# Patient Record
Sex: Male | Born: 1935
Health system: Southern US, Community
[De-identification: ages and names within clinical notes are randomized; demographics above are authoritative.]

## PROBLEM LIST (undated history)

## (undated) DIAGNOSIS — Z950 Presence of cardiac pacemaker: Secondary | ICD-10-CM

## (undated) DIAGNOSIS — R001 Bradycardia, unspecified: Secondary | ICD-10-CM

## (undated) DIAGNOSIS — T4145XA Adverse effect of unspecified anesthetic, initial encounter: Secondary | ICD-10-CM

## (undated) DIAGNOSIS — I10 Essential (primary) hypertension: Secondary | ICD-10-CM

## (undated) DIAGNOSIS — N183 Chronic kidney disease, stage 3 (moderate): Secondary | ICD-10-CM

## (undated) DIAGNOSIS — E559 Vitamin D deficiency, unspecified: Secondary | ICD-10-CM

## (undated) DIAGNOSIS — T8859XA Other complications of anesthesia, initial encounter: Secondary | ICD-10-CM

## (undated) DIAGNOSIS — M1990 Unspecified osteoarthritis, unspecified site: Secondary | ICD-10-CM

## (undated) DIAGNOSIS — H9190 Unspecified hearing loss, unspecified ear: Secondary | ICD-10-CM

## (undated) DIAGNOSIS — E785 Hyperlipidemia, unspecified: Secondary | ICD-10-CM

## (undated) DIAGNOSIS — K219 Gastro-esophageal reflux disease without esophagitis: Secondary | ICD-10-CM

## (undated) DIAGNOSIS — I4819 Other persistent atrial fibrillation: Secondary | ICD-10-CM

## (undated) DIAGNOSIS — K635 Polyp of colon: Secondary | ICD-10-CM

## (undated) HISTORY — DX: Gastro-esophageal reflux disease without esophagitis: K21.9

## (undated) HISTORY — PX: CATARACT EXTRACTION: SUR2

## (undated) HISTORY — DX: Presence of cardiac pacemaker: Z95.0

## (undated) HISTORY — DX: Vitamin D deficiency, unspecified: E55.9

## (undated) HISTORY — PX: ANKLE ARTHROPLASTY: SUR68

## (undated) HISTORY — DX: Essential (primary) hypertension: I10

## (undated) HISTORY — PX: CHOLECYSTECTOMY: SHX55

## (undated) HISTORY — DX: Hyperlipidemia, unspecified: E78.5

## (undated) HISTORY — PX: TONSILLECTOMY: SUR1361

## (undated) HISTORY — DX: Polyp of colon: K63.5

## (undated) HISTORY — PX: ROTATOR CUFF REPAIR: SHX139

## (undated) HISTORY — PX: SHOULDER SURGERY: SHX246

---

## 1999-05-10 ENCOUNTER — Encounter: Payer: Self-pay | Admitting: Family Medicine

## 1999-05-10 ENCOUNTER — Encounter: Admission: RE | Admit: 1999-05-10 | Discharge: 1999-05-10 | Payer: Self-pay | Admitting: Family Medicine

## 1999-06-14 ENCOUNTER — Encounter: Admission: RE | Admit: 1999-06-14 | Discharge: 1999-07-06 | Payer: Self-pay | Admitting: Neurosurgery

## 2002-06-02 ENCOUNTER — Ambulatory Visit (HOSPITAL_COMMUNITY): Admission: RE | Admit: 2002-06-02 | Discharge: 2002-06-02 | Payer: Self-pay | Admitting: *Deleted

## 2002-06-02 ENCOUNTER — Encounter (INDEPENDENT_AMBULATORY_CARE_PROVIDER_SITE_OTHER): Payer: Self-pay | Admitting: Specialist

## 2002-08-20 ENCOUNTER — Encounter: Admission: RE | Admit: 2002-08-20 | Discharge: 2002-08-20 | Payer: Self-pay | Admitting: Family Medicine

## 2002-08-20 ENCOUNTER — Encounter: Payer: Self-pay | Admitting: Family Medicine

## 2002-09-13 ENCOUNTER — Encounter: Payer: Self-pay | Admitting: Family Medicine

## 2002-09-13 ENCOUNTER — Encounter: Admission: RE | Admit: 2002-09-13 | Discharge: 2002-09-13 | Payer: Self-pay | Admitting: Family Medicine

## 2003-03-17 ENCOUNTER — Encounter: Admission: RE | Admit: 2003-03-17 | Discharge: 2003-03-17 | Payer: Self-pay | Admitting: Family Medicine

## 2004-09-05 ENCOUNTER — Encounter: Admission: RE | Admit: 2004-09-05 | Discharge: 2004-09-05 | Payer: Self-pay | Admitting: General Surgery

## 2004-09-06 ENCOUNTER — Ambulatory Visit (HOSPITAL_COMMUNITY): Admission: RE | Admit: 2004-09-06 | Discharge: 2004-09-06 | Payer: Self-pay | Admitting: General Surgery

## 2005-05-06 ENCOUNTER — Encounter: Admission: RE | Admit: 2005-05-06 | Discharge: 2005-05-06 | Payer: Self-pay | Admitting: Orthopaedic Surgery

## 2005-05-21 ENCOUNTER — Encounter: Admission: RE | Admit: 2005-05-21 | Discharge: 2005-06-12 | Payer: Self-pay | Admitting: Orthopaedic Surgery

## 2005-06-13 ENCOUNTER — Ambulatory Visit (HOSPITAL_BASED_OUTPATIENT_CLINIC_OR_DEPARTMENT_OTHER): Admission: RE | Admit: 2005-06-13 | Discharge: 2005-06-14 | Payer: Self-pay | Admitting: Orthopaedic Surgery

## 2005-06-25 ENCOUNTER — Encounter: Admission: RE | Admit: 2005-06-25 | Discharge: 2005-08-18 | Payer: Self-pay | Admitting: Orthopaedic Surgery

## 2005-08-19 ENCOUNTER — Encounter: Admission: RE | Admit: 2005-08-19 | Discharge: 2005-11-17 | Payer: Self-pay | Admitting: Orthopaedic Surgery

## 2007-12-04 ENCOUNTER — Encounter (INDEPENDENT_AMBULATORY_CARE_PROVIDER_SITE_OTHER): Payer: Self-pay | Admitting: *Deleted

## 2007-12-04 ENCOUNTER — Ambulatory Visit (HOSPITAL_COMMUNITY): Admission: RE | Admit: 2007-12-04 | Discharge: 2007-12-04 | Payer: Self-pay | Admitting: *Deleted

## 2009-01-03 ENCOUNTER — Encounter: Admission: RE | Admit: 2009-01-03 | Discharge: 2009-01-03 | Payer: Self-pay | Admitting: Family Medicine

## 2010-07-31 NOTE — Op Note (Signed)
NAME:  Vincent Allen, Vincent Allen             ACCOUNT NO.:  [id number]   MEDICAL RECORD NO.:  [id number]          PATIENT TYPE:  AMB   LOCATION:  ENDO                         FACILITY:  [facility]   PHYSICIAN:  Waverly Ferrari, M.D.    DATE OF BIRTH:  [birth year]   DATE OF PROCEDURE:  12/04/2007  DATE OF DISCHARGE:                               OPERATIVE REPORT   PROCEDURE:  Upper endoscopy.   INDICATIONS:  GERD with dysphagia.   ANESTHESIA:  Fentanyl 75 mcg, Versed 7.5 mg.   PROCEDURE:  With the patient mildly sedated in the left lateral  decubitus position, the Pentax videoscopic endoscope was inserted and  passed under direct vision through the esophagus which appeared normal  until we reached distal esophagus.  There is an area of inflammatory  change distally just above the squamocolumnar junction.  This was  photographed and biopsied.  We entered into the stomach.  Fundus, body  appeared normal.  Antrum, however, showed erythema in a linear fashion  extending on a fold emanating from the pylorus.  This too was  photographed and biopsied.  Duodenal bulb, second portion of duodenum  were entered and photographed, appeared normal.  From this point the  endoscope was slowly withdrawn taking circumferential views of duodenal  mucosa until the endoscope been pulled back into stomach, placed in  retroflexion to view the stomach from below.  The endoscope was  straightened and withdrawn taking circumferential views of remaining  gastric and esophageal mucosa.  The patient's vital signs, pulse  oximeter remained stable.  The patient tolerated procedure well without  apparent complications.   FINDINGS:  Inflammatory changes of distal esophagus biopsied.  Await  biopsy report.  The patient will call me for results and follow-up with  me as an outpatient.  Will start the patient on PPI and see if that  helps.  It is sporadic dysphagia symptoms.           ______________________________  Waverly Ferrari, M.D.     GMO/MEDQ  D:  12/04/2007  T:  12/07/2007  Job:  [id number]   cc:   Osvaldo Human, M.D.  Fax: [phone]

## 2010-07-31 NOTE — Op Note (Signed)
NAME:  ROCZEN, KOEHNEN             ACCOUNT NO.:  [id number]   MEDICAL RECORD NO.:  [id number]          PATIENT TYPE:  AMB   LOCATION:  ENDO                         FACILITY:  [facility]   PHYSICIAN:  Waverly Ferrari, M.D.    DATE OF BIRTH:  January 01, 1934   DATE OF PROCEDURE:  12/04/2007  DATE OF DISCHARGE:                               OPERATIVE REPORT   PROCEDURE:  Colonoscopy.   INDICATIONS:  Colon cancer screening.  Family history of colon cancer.   ANESTHESIA:  Fentanyl 50 mcg, Versed 5 mg.   PROCEDURE:  With the patient mildly sedated in the left lateral  decubitus position, a rectal examination was attempted.  Subsequently  the Pentax videoscopic colonoscope was inserted into the rectum and  passed under direct vision with pressure applied to reach the cecum  identified by ileocecal valve and appendiceal orifice, both of which  were photographed.  From this point the colonoscope was slowly withdrawn  taking circumferential views of colonic mucosa stopping only to suction  along the way especially almost the entire left colon tenacious yellow  material that had to be washed and suctioned as best we could until we  reached the rectum which appeared normal on direct and showed  hemorrhoids on retroflexed view.  The endoscope was straightened,  withdrawn.  The patient's vital signs, pulse oximeter remained stable.  The patient tolerated procedure well without apparent complication.   FINDINGS:  Internal hemorrhoids otherwise unremarkable examination  limited somewhat especially in the left side from prep but no gross  lesions seen, although small lesions could have been missed under some  of the tenacious material.   PLAN:  Will have patient follow-up with me for this with repeat  examination in 5 years.           ______________________________  Waverly Ferrari, M.D.     GMO/MEDQ  D:  12/04/2007  T:  12/07/2007  Job:  [id number]   cc:   Osvaldo Human, M.D.  Fax: [phone]

## 2010-08-03 NOTE — Op Note (Signed)
   NAME:  Vincent Allen, Vincent Allen                       ACCOUNT NO.:  [id number]   MEDICAL RECORD NO.:  [id number]                   PATIENT TYPE:  AMB   LOCATION:  ENDO                                 FACILITY:  [facility]   PHYSICIAN:  Waverly Ferrari, M.D.                 DATE OF BIRTH:  11-10-1933   DATE OF PROCEDURE:  06/02/2002  DATE OF DISCHARGE:                                 OPERATIVE REPORT   PROCEDURE:  Colonoscopy.   INDICATIONS:  Colon polyp.   ANESTHESIA:  Demerol 80 mg, Versed 7 mg.   DESCRIPTION OF PROCEDURE:  With the patient mildly sedated in the left  lateral decubitus position, the Olympus videoscopic colonoscope was inserted  in the rectum and passed under direct vision to the cecum, identified by the  ileocecal valve and appendiceal orifice, both of which were photographed.  In the cecum a small polyp was seen and photographed and removed using hot  biopsy forceps technique, setting of 20-20 blended current.  The endoscope  was then withdrawn, taking circumferential views of the remaining colonic  mucosa, stopping to photograph diverticula seen along the way both in the  right and left colon until we reached the rectum.  At approximately 15 cm  from the anal verge a second polyp was seen, photographed, and removed,  again using hot biopsy forceps technique and again a setting of 20/20  blended current.  The endoscope was then withdrawn.  I could not place it in  retroflexed view, noting that it made the patient too uncomfortable to do,  but no gross lesions were seen other than this, and the endoscope was  withdrawn.  The patient's vital signs and pulse oximetry remained stable.  The patient tolerated the procedure well without apparent complications.   FINDINGS:  Polyp of the cecum and at 15 cm from the anal verge.   PLAN:  Await biopsy report.  Both polyps were small and benign-appearing.  The patient will call me for results and follow up with me as an  outpatient.                                               Waverly Ferrari, M.D.    GMO/MEDQ  D:  06/02/2002  T:  06/02/2002  Job:  [id number]   cc:   Osvaldo Human, M.D.  [address] [place]  [place] [address]  Fax: [phone]

## 2010-08-03 NOTE — Op Note (Signed)
NAME:  Vincent Allen, Vincent Allen NO.:  [id number]   MEDICAL RECORD NO.:  [id number]           PATIENT TYPE:   LOCATION:                                 FACILITY:   PHYSICIAN:  Vonna Kotyk. Durward Fortes, M.D.    DATE OF BIRTH:   DATE OF PROCEDURE:  06/13/2005  DATE OF DISCHARGE:                                 OPERATIVE REPORT   PREOPERATIVE DIAGNOSES:  1.  Massive rotator cuff tear with impingement right shoulder.  2.  Degenerative joint disease of acromioclavicular joint.   POSTOPERATIVE DIAGNOSES:  1.  Massive rotator cuff tear with impingement right shoulder.  2.  Degenerative joint disease of acromioclavicular joint.  3.  Rupture of biceps tendon.   PROCEDURE:  1.  Arthroscopic debridement of right shoulder.  2.  Arthroscopic subacromial decompression.  3.  Arthroscopic distal clavicle resection.  4.  Mini open rotator cuff tear repair (partial repair).   SURGEON:  Vonna Kotyk. Durward Fortes, M.D.   ASSISTANT:  Erskine Emery, P.A.-C.   ANESTHESIA:  General orotracheal with supplemental neuroscalene nerve block.   COMPLICATIONS:  None.   HISTORY:  This 77 year old gentleman has been having trouble with his right  shoulder for approximately 2 months. He fell on his right side with  immediate onset of shoulder pain.  Prior to that time he really was not  having any significant discomfort, although he does note that in 1999 he  felt and hurt his shoulder but that pain resolved.  He had some weakness  with internal-external rotation with pain over the greater tuberosity.  X-  rays consistent with a high-riding humeral head.  An MRI scan revealed a  grade 2 AC joint separation with probable fracture fragments along the  dorsal aspect of the joint and an extensive full-thickness cuff tear of the  supra-infraspinatus tendons with retraction of the musculotendinous junction  of approximately 4 cm.  He is now to have an arthroscopic evaluation attempt  at rotator cuff tear  repair.   DESCRIPTION OF PROCEDURE:  With the patient comfortable on the operating  room table and under general endotracheal anesthesia the patient was placed  in the semisitting position with the shoulder frame.  The right shoulder was  then prepped with dura prep and the base of the neck circumferentially to  about the wrist.  Sterile draping was then performed.  The patient did have  a preoperative interscalene nerve block.  Marking pen was used to outline  the AC-joint, the coracoid and acromion.  At a point a fingerbreadth  posterior and medial to the posterior angle of the acromion a small stab  wound was made and the arthroscope was easily placed into the shoulder  joint.  Arthroscopic evaluation revealed acute synovitis and, accordingly a  second portal was established anteriorly with the 4 mm cannula. The biceps  tendon appeared to be attached by one small thread and this was debrided  with the ArthroCare wand.  Synovitis was debrided with the ArthroCare wand,  as well.  There was some fraying of the glenoid labrum which was debrided.  I did not see  any appreciable chondromalacia of the humeral head or the  glenoid nor did I see any loose bodies.  There was an obvious massive  rotator cuff tear as I could easily visualize the subacromial space from the  joint.   The arthroscope was then placed in the subacromial space posteriorly, the  cannula placed in the subacromial space anteriorly, and a third portal  established in the lateral subacromial space. An arthroscopic subacromial  decompression was performed with the ArthroCare wand and the Coude shaver.  There was considerable bursal material which was debrided.  There was  obvious anterior overhang of the acromion.  A 6 mm burr was used to obtain  an anterior-inferior acromioplasty.  There was considerable degenerative  change in the distal clavicle; and, accordingly, a distal clavicle resection  was performed using the same  6-mm bur with an excellent resection.   An open exploration of the rotator cuff was then performed through about 1-  1/2-inch incision along the anterior aspect of the shoulder.  A very sharp  dissection and incision carried down to the subcutaneous tissue.  Deltoid  fascia was identified and incised along its fibers.  A self-retaining  retractor was inserted.  There was still bursal tissue in the recesses  anteriorly and laterally and this was debrided.  The edge of the rotator  cuff was identified anteriorly as well as posteriorly.  This involved the  infra and supraspinatus as well as the subscapularis.  The biceps tendon was  not visualized and the previously identified had been ruptured.  The tear  was massive and was significantly retracted.  I was able to advance it to  some extent, but still left a relatively large gap between the anterior and  posterior leaflets.  Three __________ anchors were utilized to obtain a  partial repair.  The wound was then irrigated with saline solution.  The  deltoid fascia was closed with a running #0 Vicryl.  The subcu with 2-0  Vicryl and the skin closed with Prolene.  A sterile bulky dressing was  applied followed by a sling.   PLAN:  Recovery care.  Discharge in the a.m. on Percocet for pain.      Vonna Kotyk. Durward Fortes, M.D.  Electronically Signed     PWW/MEDQ  D:  06/13/2005  T:  06/14/2005  Job:  [id number]

## 2010-08-28 ENCOUNTER — Encounter: Payer: Self-pay | Admitting: Internal Medicine

## 2010-08-28 ENCOUNTER — Ambulatory Visit (INDEPENDENT_AMBULATORY_CARE_PROVIDER_SITE_OTHER): Payer: Medicare Other | Admitting: Internal Medicine

## 2010-08-28 DIAGNOSIS — I10 Essential (primary) hypertension: Secondary | ICD-10-CM

## 2010-08-28 DIAGNOSIS — I6529 Occlusion and stenosis of unspecified carotid artery: Secondary | ICD-10-CM

## 2010-08-28 DIAGNOSIS — Z800 Family history of malignant neoplasm of digestive organs: Secondary | ICD-10-CM

## 2010-08-28 DIAGNOSIS — M8580 Other specified disorders of bone density and structure, unspecified site: Secondary | ICD-10-CM

## 2010-08-28 DIAGNOSIS — G5600 Carpal tunnel syndrome, unspecified upper limb: Secondary | ICD-10-CM

## 2010-08-28 DIAGNOSIS — M949 Disorder of cartilage, unspecified: Secondary | ICD-10-CM

## 2010-08-28 DIAGNOSIS — E785 Hyperlipidemia, unspecified: Secondary | ICD-10-CM

## 2010-08-28 DIAGNOSIS — K219 Gastro-esophageal reflux disease without esophagitis: Secondary | ICD-10-CM

## 2010-08-28 DIAGNOSIS — Z125 Encounter for screening for malignant neoplasm of prostate: Secondary | ICD-10-CM

## 2010-08-28 DIAGNOSIS — E119 Type 2 diabetes mellitus without complications: Secondary | ICD-10-CM

## 2010-08-28 DIAGNOSIS — M899 Disorder of bone, unspecified: Secondary | ICD-10-CM

## 2010-08-28 LAB — LIPID PANEL
Cholesterol: 145 mg/dL (ref 0–200)
HDL: 30.5 mg/dL — ABNORMAL LOW
LDL Cholesterol: 83 mg/dL (ref 0–99)
Total CHOL/HDL Ratio: 5
Triglycerides: 160 mg/dL — ABNORMAL HIGH (ref 0.0–149.0)
VLDL: 32 mg/dL (ref 0.0–40.0)

## 2010-08-28 LAB — MICROALBUMIN / CREATININE URINE RATIO: Microalb, Ur: 0.3 mg/dL (ref 0.0–1.9)

## 2010-08-28 LAB — BASIC METABOLIC PANEL WITH GFR
BUN: 36 mg/dL — ABNORMAL HIGH (ref 6–23)
CO2: 28 meq/L (ref 19–32)
Calcium: 9 mg/dL (ref 8.4–10.5)
Chloride: 104 meq/L (ref 96–112)
Creatinine, Ser: 1.5 mg/dL (ref 0.4–1.5)
GFR: 48.48 mL/min — ABNORMAL LOW
Glucose, Bld: 115 mg/dL — ABNORMAL HIGH (ref 70–99)
Potassium: 5.3 meq/L — ABNORMAL HIGH (ref 3.5–5.1)
Sodium: 138 meq/L (ref 135–145)

## 2010-08-28 LAB — HEMOGLOBIN A1C: Hgb A1c MFr Bld: 6.9 % — ABNORMAL HIGH (ref 4.6–6.5)

## 2010-08-28 LAB — CBC WITH DIFFERENTIAL/PLATELET
Basophils Absolute: 0 10*3/uL (ref 0.0–0.1)
Eosinophils Absolute: 0.1 10*3/uL (ref 0.0–0.7)
HCT: 40.3 % (ref 39.0–52.0)
Lymphs Abs: 1.5 10*3/uL (ref 0.7–4.0)
RBC: 4.74 Mil/uL (ref 4.22–5.81)
WBC: 5.8 10*3/uL (ref 4.5–10.5)

## 2010-08-28 LAB — HEPATIC FUNCTION PANEL
ALT: 18 U/L (ref 0–53)
AST: 19 U/L (ref 0–37)
Albumin: 3.5 g/dL (ref 3.5–5.2)
Alkaline Phosphatase: 58 U/L (ref 39–117)
Bilirubin, Direct: 0.1 mg/dL (ref 0.0–0.3)

## 2010-08-28 LAB — PSA: PSA: 1.07 ng/mL (ref 0.10–4.00)

## 2010-08-29 ENCOUNTER — Telehealth: Payer: Self-pay

## 2010-08-29 NOTE — Telephone Encounter (Signed)
Message copied by Santiago Bumpers on Wed Aug 29, 2010  2:14 PM ------      Message from: Anise Salvo      Created: Tue Aug 28, 2010 10:04 PM       A1c under average control 6.9. hdl low and may improve with regular exercise. Other chol numbers ok. psa and urine protein nl

## 2010-08-29 NOTE — Telephone Encounter (Signed)
Pt.notified

## 2010-09-02 DIAGNOSIS — I779 Disorder of arteries and arterioles, unspecified: Secondary | ICD-10-CM | POA: Insufficient documentation

## 2010-09-02 DIAGNOSIS — E785 Hyperlipidemia, unspecified: Secondary | ICD-10-CM | POA: Insufficient documentation

## 2010-09-02 DIAGNOSIS — E1122 Type 2 diabetes mellitus with diabetic chronic kidney disease: Secondary | ICD-10-CM | POA: Insufficient documentation

## 2010-09-02 DIAGNOSIS — Z800 Family history of malignant neoplasm of digestive organs: Secondary | ICD-10-CM | POA: Insufficient documentation

## 2010-09-02 DIAGNOSIS — I119 Hypertensive heart disease without heart failure: Secondary | ICD-10-CM | POA: Insufficient documentation

## 2010-09-02 DIAGNOSIS — M8580 Other specified disorders of bone density and structure, unspecified site: Secondary | ICD-10-CM | POA: Insufficient documentation

## 2010-09-02 DIAGNOSIS — K219 Gastro-esophageal reflux disease without esophagitis: Secondary | ICD-10-CM | POA: Insufficient documentation

## 2010-09-02 NOTE — Assessment & Plan Note (Signed)
Attempt wrist splint bilaterally.Followup if no improvement or worsening.

## 2010-09-02 NOTE — Assessment & Plan Note (Signed)
Schedule bone density.    

## 2010-09-02 NOTE — Progress Notes (Signed)
  Subjective:    Patient ID: Vincent Allen, male    DOB: 1934-01-02, 77 y.o.   MRN: [id number]  HPI patient presents to clinic to establish care and for evaluation of multiple medical problems. Has history of diabetes with typical values between 85 and 95 with a low of 65. No hypoglycemic symptoms.  Does have family history of colon cancer in first degree relatives and believes colonoscopy performed approximately 2008. Has no abdominal pain, change in bowel habits or blood in stool. May 2012 performed [employer] screening and this is reviewed with patient today. Mild plaque in left carotid, normal ABI, normal EKG and bone density with -1.7 T score. No history of bony fracture. Does have bilateral fingertip numbness intermittently of left greater than right. No neck pain, radicular pain shoulder pain or elbow pain. States cannot obtain flu shot because of allergy. Blood pressure reviewed under average control with history of hypertension. Tolerates statin therapy without myalgias or known history of abnormal liver function tests. No other complaints  Reviewed past medical history, past surgical history, medications, allergies, social history and family history  Review of Systems review of systems negative except as modified per history of present illness     Objective:   Physical Exam    Physical Exam  Vitals reviewed. Constitutional:  appears well-developed and well-nourished. No distress.  HENT:  Head: Normocephalic and atraumatic.  Right Ear: Tympanic membrane, external ear and ear canal normal.  Left Ear: Tympanic membrane, external ear and ear canal normal.  Nose: Nose normal.  Mouth/Throat: Oropharynx is clear and moist. No oropharyngeal exudate.  Eyes: Conjunctivae and EOM are normal. Pupils are equal, round, and reactive to light. Right eye exhibits no discharge. Left eye exhibits no discharge. No scleral icterus.  Neck: Neck supple. No thyromegaly present.  Cardiovascular: Normal  rate, regular rhythm and normal heart sounds.  Exam reveals no gallop and no friction rub.   No murmur heard. Pulmonary/Chest: Effort normal and breath sounds normal. No respiratory distress.  has no wheezes.  has no rales.  Lymphadenopathy:   no cervical adenopathy.  Neurological:  is alert.  Skin: Skin is warm and dry.  not diaphoretic.  Psychiatric: normal mood and affect.      Assessment & Plan:

## 2010-09-02 NOTE — Assessment & Plan Note (Signed)
Continue statin therapy. Obtain fasting lipid profile and liver function tests

## 2010-09-02 NOTE — Assessment & Plan Note (Signed)
No stenosis. Recommend aspirin, control of cholesterol and blood pressure.

## 2010-09-02 NOTE — Assessment & Plan Note (Signed)
Good control based on fingerstick blood sugar. Hold Actos and continue to monitor blood sugar. Obtain CBC, Chem-7, A1c and urine microalbumin

## 2010-09-28 ENCOUNTER — Other Ambulatory Visit: Payer: Self-pay | Admitting: *Deleted

## 2010-09-28 MED ORDER — VERAPAMIL HCL 240 MG PO TBCR: 240.0000 mg | EXTENDED_RELEASE_TABLET | Freq: Every day | ORAL | Status: DC

## 2010-09-28 MED ORDER — LISINOPRIL 5 MG PO TABS: 5.0000 mg | ORAL_TABLET | Freq: Every day | ORAL | Status: DC

## 2010-09-28 MED ORDER — ATORVASTATIN CALCIUM 20 MG PO TABS: 20.0000 mg | ORAL_TABLET | Freq: Every day | ORAL | Status: DC

## 2010-09-28 NOTE — Telephone Encounter (Signed)
Refill Lisinopril 10 mg, verapamil 240 mg, lipitor 40 mg [facility]

## 2010-11-09 ENCOUNTER — Telehealth: Payer: Self-pay | Admitting: Internal Medicine

## 2010-11-09 MED ORDER — TRIAMTERENE-HCTZ 75-50 MG PO TABS: 1.0000 | ORAL_TABLET | Freq: Every day | ORAL | Status: DC

## 2010-11-09 NOTE — Telephone Encounter (Signed)
Refill- triamterene-hctz 75-50mg  tab. Take one tablet once a day orally. Qty 34. Last fill 7.29.12

## 2010-11-09 NOTE — Telephone Encounter (Signed)
Rx refill sent to pharmacy. 

## 2010-11-20 ENCOUNTER — Telehealth: Payer: Self-pay | Admitting: Internal Medicine

## 2010-11-20 MED ORDER — GLIPIZIDE 10 MG PO TABS: 10.0000 mg | ORAL_TABLET | Freq: Two times a day (BID) | ORAL | Status: DC

## 2010-11-20 NOTE — Telephone Encounter (Signed)
Refill-glipizide er 10mg  tablet. Take two tablets by mouth every day. Qty 30 last fill 8.7.12

## 2010-11-20 NOTE — Telephone Encounter (Signed)
Rx refill sent to pharmacy. 

## 2010-11-28 ENCOUNTER — Ambulatory Visit: Payer: Medicare Other | Admitting: Internal Medicine

## 2010-12-10 ENCOUNTER — Ambulatory Visit: Payer: Medicare Other | Attending: Orthopaedic Surgery

## 2010-12-10 DIAGNOSIS — M6281 Muscle weakness (generalized): Secondary | ICD-10-CM | POA: Insufficient documentation

## 2010-12-10 DIAGNOSIS — M25519 Pain in unspecified shoulder: Secondary | ICD-10-CM | POA: Insufficient documentation

## 2010-12-10 DIAGNOSIS — R5381 Other malaise: Secondary | ICD-10-CM | POA: Insufficient documentation

## 2010-12-10 DIAGNOSIS — M25619 Stiffness of unspecified shoulder, not elsewhere classified: Secondary | ICD-10-CM | POA: Insufficient documentation

## 2010-12-10 DIAGNOSIS — IMO0001 Reserved for inherently not codable concepts without codable children: Secondary | ICD-10-CM | POA: Insufficient documentation

## 2010-12-12 ENCOUNTER — Ambulatory Visit: Payer: Medicare Other

## 2010-12-17 ENCOUNTER — Ambulatory Visit: Payer: Medicare Other | Attending: Orthopaedic Surgery

## 2010-12-17 DIAGNOSIS — M25619 Stiffness of unspecified shoulder, not elsewhere classified: Secondary | ICD-10-CM | POA: Insufficient documentation

## 2010-12-17 DIAGNOSIS — M6281 Muscle weakness (generalized): Secondary | ICD-10-CM | POA: Insufficient documentation

## 2010-12-17 DIAGNOSIS — R5381 Other malaise: Secondary | ICD-10-CM | POA: Insufficient documentation

## 2010-12-17 DIAGNOSIS — M25519 Pain in unspecified shoulder: Secondary | ICD-10-CM | POA: Insufficient documentation

## 2010-12-17 DIAGNOSIS — IMO0001 Reserved for inherently not codable concepts without codable children: Secondary | ICD-10-CM | POA: Insufficient documentation

## 2010-12-21 ENCOUNTER — Other Ambulatory Visit: Payer: Self-pay | Admitting: Internal Medicine

## 2010-12-21 ENCOUNTER — Encounter: Payer: Medicare Other | Admitting: Physical Therapy

## 2010-12-21 NOTE — Telephone Encounter (Signed)
Rx refill sent to pharmacy. 

## 2010-12-24 ENCOUNTER — Encounter: Payer: Medicare Other | Admitting: Physical Therapy

## 2010-12-26 ENCOUNTER — Ambulatory Visit (INDEPENDENT_AMBULATORY_CARE_PROVIDER_SITE_OTHER): Payer: Medicare Other | Admitting: Internal Medicine

## 2010-12-26 ENCOUNTER — Encounter: Payer: Medicare Other | Admitting: Physical Therapy

## 2010-12-26 ENCOUNTER — Encounter: Payer: Self-pay | Admitting: Internal Medicine

## 2010-12-26 DIAGNOSIS — E785 Hyperlipidemia, unspecified: Secondary | ICD-10-CM

## 2010-12-26 DIAGNOSIS — M8580 Other specified disorders of bone density and structure, unspecified site: Secondary | ICD-10-CM

## 2010-12-26 DIAGNOSIS — E119 Type 2 diabetes mellitus without complications: Secondary | ICD-10-CM

## 2010-12-26 DIAGNOSIS — I10 Essential (primary) hypertension: Secondary | ICD-10-CM

## 2010-12-26 DIAGNOSIS — M899 Disorder of bone, unspecified: Secondary | ICD-10-CM

## 2010-12-26 LAB — HEPATIC FUNCTION PANEL
Albumin: 3.8 g/dL (ref 3.5–5.2)
Bilirubin, Direct: 0.2 mg/dL (ref 0.0–0.3)
Indirect Bilirubin: 0.6 mg/dL (ref 0.0–0.9)
Total Bilirubin: 0.8 mg/dL (ref 0.3–1.2)
Total Protein: 7.1 g/dL (ref 6.0–8.3)

## 2010-12-26 LAB — LIPID PANEL
Cholesterol: 156 mg/dL (ref 0–200)
HDL: 25 mg/dL — ABNORMAL LOW (ref >39)
Total CHOL/HDL Ratio: 6.2 Ratio
VLDL: 37 mg/dL (ref 0–40)

## 2010-12-26 LAB — BASIC METABOLIC PANEL: Glucose, Bld: 128 mg/dL — ABNORMAL HIGH (ref 70–99)

## 2010-12-26 LAB — HEMOGLOBIN A1C: Hgb A1c MFr Bld: 7.7 % — ABNORMAL HIGH (ref <5.7)

## 2010-12-26 NOTE — Assessment & Plan Note (Signed)
Schedule BMD

## 2010-12-26 NOTE — Patient Instructions (Signed)
Please schedule bone density (diagnosis-osteopenia)

## 2010-12-26 NOTE — Assessment & Plan Note (Signed)
Normotensive and stable. Continue current regimen. Monitor bp as outpt and followup in clinic as scheduled.  

## 2010-12-26 NOTE — Assessment & Plan Note (Signed)
Mildly suboptimal fsbs secondary to recent trauma. Obtain chem7 and a1c. Anticipate improvement of control once meals are nl and activity improved.

## 2010-12-26 NOTE — Progress Notes (Signed)
  Subjective:    Patient ID: Vincent Allen, male    DOB: February 08, 1934, 77 y.o.   MRN: [id number]  HPI Pt presents to clinic for followup of multiple medical problems. Since last visit had injury to left shoulder that required surgery. Did well postoperatively however suffered fall ~2wks ago with resulting fx involving left ?shoulder. Did not require surgery. Is followed by orthopedics and taking alternating percocet and ultram. Diabetes off actos had been under average control with fsbs low 100's without hypoglycemia. With recent fx has seen mild increase to mid 100's. BP reviewed nl. Has lost weight since being off actos. No other complaints.   Past Medical History  Diagnosis Date  . Cataract   . GERD (gastroesophageal reflux disease)   . Diabetes mellitus   . Hypertension   . Hyperlipidemia   . Colon polyps    Past Surgical History  Procedure Date  . Cataract extraction   . Rotator cuff repair   . Ankle arthroplasty   . Tonsillectomy     reports that he has never smoked. He has never used smokeless tobacco. He reports that he does not drink alcohol or use illicit drugs. family history includes Cancer in his mother and paternal grandmother; Heart disease in his father; and Stroke in his father. Allergies  Allergen Reactions  . Penicillins      Review of Systems see hpi     Objective:   Physical Exam  Physical Exam  Nursing note and vitals reviewed. Constitutional: Appears well-developed and well-nourished. No distress.  HENT: OP clear Head: Normocephalic and atraumatic.  Right Ear: External ear normal.  Left Ear: External ear normal.  Eyes: Conjunctivae are normal. No scleral icterus.  Neck: Neck supple. Carotid bruit is not present.  Cardiovascular: Normal rate, regular rhythm and normal heart sounds.  Exam reveals no gallop and no friction rub.   No murmur heard. Pulmonary/Chest: Effort normal and breath sounds normal. No respiratory distress. He has no wheezes. no  rales.  Lymphadenopathy:    He has no cervical adenopathy.  Neurological:Alert.  Skin: Skin is warm and dry. Not diaphoretic.  Psychiatric: Has a normal mood and affect.        Assessment & Plan:

## 2010-12-26 NOTE — Assessment & Plan Note (Signed)
Obtain lipid/lft. 

## 2010-12-28 ENCOUNTER — Other Ambulatory Visit: Payer: Self-pay | Admitting: Internal Medicine

## 2010-12-28 ENCOUNTER — Encounter: Payer: Medicare Other | Admitting: Physical Therapy

## 2010-12-31 ENCOUNTER — Encounter: Payer: Medicare Other | Admitting: Physical Therapy

## 2010-12-31 NOTE — Telephone Encounter (Signed)
Rx refill sent to pharmacy. 

## 2011-01-02 ENCOUNTER — Encounter: Payer: Medicare Other | Admitting: Physical Therapy

## 2011-01-03 ENCOUNTER — Ambulatory Visit: Payer: Medicare Other

## 2011-01-04 ENCOUNTER — Encounter: Payer: Medicare Other | Admitting: Physical Therapy

## 2011-01-07 ENCOUNTER — Ambulatory Visit: Payer: Medicare Other | Admitting: Physical Therapy

## 2011-01-10 ENCOUNTER — Ambulatory Visit: Payer: Medicare Other

## 2011-01-14 ENCOUNTER — Ambulatory Visit: Payer: Medicare Other

## 2011-01-14 ENCOUNTER — Ambulatory Visit (INDEPENDENT_AMBULATORY_CARE_PROVIDER_SITE_OTHER)
Admission: RE | Admit: 2011-01-14 | Discharge: 2011-01-14 | Disposition: A | Discharge status: Home / Self Care | Payer: Medicare Other | Source: Ambulatory Visit

## 2011-01-14 DIAGNOSIS — M8580 Other specified disorders of bone density and structure, unspecified site: Secondary | ICD-10-CM

## 2011-01-14 DIAGNOSIS — M899 Disorder of bone, unspecified: Secondary | ICD-10-CM

## 2011-01-14 DIAGNOSIS — M949 Disorder of cartilage, unspecified: Secondary | ICD-10-CM

## 2011-01-17 ENCOUNTER — Ambulatory Visit: Payer: Medicare Other | Attending: Orthopaedic Surgery

## 2011-01-17 ENCOUNTER — Encounter: Payer: Self-pay | Admitting: Internal Medicine

## 2011-01-17 DIAGNOSIS — M25619 Stiffness of unspecified shoulder, not elsewhere classified: Secondary | ICD-10-CM | POA: Insufficient documentation

## 2011-01-17 DIAGNOSIS — IMO0001 Reserved for inherently not codable concepts without codable children: Secondary | ICD-10-CM | POA: Insufficient documentation

## 2011-01-17 DIAGNOSIS — M25519 Pain in unspecified shoulder: Secondary | ICD-10-CM | POA: Insufficient documentation

## 2011-01-17 DIAGNOSIS — R5381 Other malaise: Secondary | ICD-10-CM | POA: Insufficient documentation

## 2011-01-17 DIAGNOSIS — M6281 Muscle weakness (generalized): Secondary | ICD-10-CM | POA: Insufficient documentation

## 2011-01-21 ENCOUNTER — Ambulatory Visit: Payer: Medicare Other

## 2011-01-24 ENCOUNTER — Ambulatory Visit: Payer: Medicare Other

## 2011-01-28 ENCOUNTER — Ambulatory Visit: Payer: Medicare Other

## 2011-01-30 ENCOUNTER — Other Ambulatory Visit: Payer: Self-pay | Admitting: Internal Medicine

## 2011-01-31 ENCOUNTER — Ambulatory Visit: Payer: Medicare Other

## 2011-02-04 ENCOUNTER — Ambulatory Visit: Payer: Medicare Other

## 2011-02-06 ENCOUNTER — Encounter: Payer: Medicare Other | Admitting: Physical Therapy

## 2011-02-11 ENCOUNTER — Telehealth: Payer: Self-pay | Admitting: Internal Medicine

## 2011-02-11 ENCOUNTER — Encounter: Payer: Medicare Other | Admitting: Physical Therapy

## 2011-02-11 NOTE — Telephone Encounter (Signed)
Glipizide er 10mg  tablet. Take one tablet by mouth twice a day before meal. Qty 60   Pharmacy comments: pt says 3 tabs per day, need new rx

## 2011-02-12 ENCOUNTER — Telehealth: Payer: Self-pay | Admitting: Internal Medicine

## 2011-02-12 MED ORDER — GLIPIZIDE 10 MG PO TABS: ORAL_TABLET | ORAL | Status: DC

## 2011-02-12 NOTE — Telephone Encounter (Signed)
[facility] [phone], spoke with pharmacist Maudie Mercury. She was advised of dosing change and indication with Er. Rx will remain at regular Glipizide per Dr Elizebeth Koller.

## 2011-02-12 NOTE — Telephone Encounter (Signed)
Rx refill sent to pharmacy. 

## 2011-02-12 NOTE — Telephone Encounter (Signed)
Call from Pharmacy     Pt has previously been on Glipizide ER,  rx sent yesterday was for Glipizide  Should this be ER  Please call pharmacy

## 2011-02-14 ENCOUNTER — Ambulatory Visit: Payer: Medicare Other

## 2011-02-21 ENCOUNTER — Ambulatory Visit: Payer: Medicare Other | Attending: Orthopaedic Surgery | Admitting: Physical Therapy

## 2011-02-21 DIAGNOSIS — R5381 Other malaise: Secondary | ICD-10-CM | POA: Insufficient documentation

## 2011-02-21 DIAGNOSIS — M6281 Muscle weakness (generalized): Secondary | ICD-10-CM | POA: Insufficient documentation

## 2011-02-21 DIAGNOSIS — M25519 Pain in unspecified shoulder: Secondary | ICD-10-CM | POA: Insufficient documentation

## 2011-02-21 DIAGNOSIS — M25619 Stiffness of unspecified shoulder, not elsewhere classified: Secondary | ICD-10-CM | POA: Insufficient documentation

## 2011-02-21 DIAGNOSIS — IMO0001 Reserved for inherently not codable concepts without codable children: Secondary | ICD-10-CM | POA: Insufficient documentation

## 2011-02-26 ENCOUNTER — Telehealth: Payer: Self-pay | Admitting: Internal Medicine

## 2011-02-26 MED ORDER — GLUCOSE BLOOD VI STRP: ORAL_STRIP | Status: DC

## 2011-02-26 NOTE — Telephone Encounter (Signed)
Patient came into the office requesting a refill on One Touch ultra BL. Qty 100 test once daily as directed.   Please send rx to [facility] on [address].

## 2011-02-26 NOTE — Telephone Encounter (Signed)
Rx sent to pharmacy   

## 2011-02-28 ENCOUNTER — Ambulatory Visit: Payer: Medicare Other | Admitting: Physical Therapy

## 2011-03-07 ENCOUNTER — Ambulatory Visit: Payer: Medicare Other

## 2011-03-20 NOTE — Progress Notes (Signed)
Addended by: Kelle Darting A on: 03/20/2011 08:18 AM   Modules accepted: Orders

## 2011-03-26 ENCOUNTER — Other Ambulatory Visit: Payer: Self-pay | Admitting: Internal Medicine

## 2011-03-26 NOTE — Telephone Encounter (Signed)
Rx refill sent to pharmacy. 

## 2011-03-28 ENCOUNTER — Encounter: Payer: Self-pay | Admitting: Internal Medicine

## 2011-03-28 ENCOUNTER — Ambulatory Visit (INDEPENDENT_AMBULATORY_CARE_PROVIDER_SITE_OTHER): Payer: Medicare Other | Admitting: Internal Medicine

## 2011-03-28 ENCOUNTER — Telehealth: Payer: Self-pay | Admitting: Internal Medicine

## 2011-03-28 DIAGNOSIS — E785 Hyperlipidemia, unspecified: Secondary | ICD-10-CM

## 2011-03-28 DIAGNOSIS — E119 Type 2 diabetes mellitus without complications: Secondary | ICD-10-CM

## 2011-03-28 DIAGNOSIS — I10 Essential (primary) hypertension: Secondary | ICD-10-CM

## 2011-03-28 NOTE — Telephone Encounter (Signed)
Lab orders entered for April 2013.

## 2011-03-28 NOTE — Assessment & Plan Note (Signed)
Stable. Obtain lipid/lft prior to next visit. 

## 2011-03-28 NOTE — Assessment & Plan Note (Signed)
Normotensive and stable. Continue current regimen. Monitor bp as outpt and followup in clinic as scheduled.  

## 2011-03-28 NOTE — Patient Instructions (Signed)
Please schedule fasting labs prior to next visit Cbc, chem7, a1c, urine microalbumin 250.0 and lipid/lft 272.4

## 2011-03-28 NOTE — Assessment & Plan Note (Signed)
suboptimal control. Discussed increase of medication dose. Pt wishes to pursue dietary modification first. Call with fsbs report prior to next visit if fasting values remain above 130. Obtain cbc, chem7, a1c, urine microalbumin prior to next visit.

## 2011-03-28 NOTE — Progress Notes (Signed)
  Subjective:    Patient ID: Vincent Allen, male    DOB: [birth year], 78 y.o.   MRN: [id number]  HPI Pt presents to clinic for followup of multiple medical problems. Shoulder doing well s/p surgery. No pain for the last several weeks. fsbs log reviewed with recent increase of values. Low to upper 100's noted without hypoglycemia. Admits to dietary indiscretion including soda daily. Reviewed h/o osteopenia with recent nl vitamin d level. No other complaints.  Past Medical History  Diagnosis Date  . Cataract   . GERD (gastroesophageal reflux disease)   . Diabetes mellitus   . Hypertension   . Hyperlipidemia   . Colon polyps    Past Surgical History  Procedure Date  . Cataract extraction   . Rotator cuff repair   . Ankle arthroplasty   . Tonsillectomy     reports that he has never smoked. He has never used smokeless tobacco. He reports that he does not drink alcohol or use illicit drugs. family history includes Cancer in his mother and paternal grandmother; Heart disease in his father; and Stroke in his father. Allergies  Allergen Reactions  . Penicillins       Review of Systems see hpi     Objective:   Physical Exam  Physical Exam  Nursing note and vitals reviewed. Constitutional: Appears well-developed and well-nourished. No distress.  HENT:  Head: Normocephalic and atraumatic.  Right Ear: External ear normal.  Left Ear: External ear normal.  Eyes: Conjunctivae are normal. No scleral icterus.  Neck: Neck supple. Carotid bruit is not present.  Cardiovascular: Normal rate, regular rhythm and normal heart sounds.  Exam reveals no gallop and no friction rub.   No murmur heard. Pulmonary/Chest: Effort normal and breath sounds normal. No respiratory distress. He has no wheezes. no rales.  Lymphadenopathy:    He has no cervical adenopathy.  Neurological:Alert.  Skin: Skin is warm and dry. Not diaphoretic.  Psychiatric: Has a normal mood and affect.        Assessment  & Plan:

## 2011-04-10 ENCOUNTER — Other Ambulatory Visit: Payer: Self-pay | Admitting: Internal Medicine

## 2011-04-10 NOTE — Telephone Encounter (Signed)
Rx refill sent to pharmacy. 

## 2011-04-15 ENCOUNTER — Telehealth: Payer: Self-pay | Admitting: Internal Medicine

## 2011-04-15 NOTE — Telephone Encounter (Signed)
Patient called   BS   Fasting 146  04/16/11     /   Fasting 141  On 04/14/11     Please patient

## 2011-04-16 NOTE — Telephone Encounter (Signed)
Call placed to patient at [phone], no answer. A voice message was left for patient to return call regarding blood sugars.

## 2011-04-18 ENCOUNTER — Encounter: Payer: Self-pay | Admitting: Internal Medicine

## 2011-04-18 NOTE — Telephone Encounter (Signed)
Notified pt per instructions below.

## 2011-04-18 NOTE — Telephone Encounter (Signed)
Appears he has immediate release glipizide (not XL) so can take up to 40mg  / day. Can increase to 2 in am (20mg ) and continue 1.5 in evening (30mg ). If remains high can increase to max of 20mg  bid

## 2011-04-18 NOTE — Telephone Encounter (Signed)
Mild elevations. Glucotrol listed as 10mg  1 in am and 1/2 in pm. Can increase to 1 bid and monitor fsbs. pls remind that you attempted to contact him unsuccessfully

## 2011-04-18 NOTE — Telephone Encounter (Signed)
Call placed to patient [phone], he stated that he is taking his Glucotrol 1 1/2 twice a day. He was informed I will check back with Dr Elizebeth Koller and call him back.

## 2011-04-18 NOTE — Telephone Encounter (Signed)
Advise, I had attempted to get additional information with no success.See My chart message.

## 2011-05-29 ENCOUNTER — Other Ambulatory Visit: Payer: Self-pay | Admitting: Internal Medicine

## 2011-06-13 ENCOUNTER — Telehealth: Payer: Self-pay | Admitting: Internal Medicine

## 2011-06-13 MED ORDER — TRIAMTERENE-HCTZ 75-50 MG PO TABS: 1.0000 | ORAL_TABLET | Freq: Every day | ORAL | Status: DC

## 2011-06-13 NOTE — Telephone Encounter (Signed)
90 day prescription request  Triamterene-hctz 75-50mg  tab

## 2011-06-13 NOTE — Telephone Encounter (Signed)
Rx quantity updated and sent to pharmacy.

## 2011-06-23 ENCOUNTER — Other Ambulatory Visit: Payer: Self-pay | Admitting: Internal Medicine

## 2011-06-24 NOTE — Telephone Encounter (Signed)
Rx refill sent to pharmacy. 

## 2011-06-27 ENCOUNTER — Other Ambulatory Visit: Payer: Self-pay | Admitting: *Deleted

## 2011-06-27 ENCOUNTER — Encounter: Payer: Self-pay | Admitting: Internal Medicine

## 2011-06-27 ENCOUNTER — Ambulatory Visit (INDEPENDENT_AMBULATORY_CARE_PROVIDER_SITE_OTHER): Payer: Medicare Other | Admitting: Internal Medicine

## 2011-06-27 VITALS — BP 120/60 | HR 61 | Temp 97.8°F | Resp 20 | Ht 72.0 in | Wt 238.0 lb

## 2011-06-27 DIAGNOSIS — E785 Hyperlipidemia, unspecified: Secondary | ICD-10-CM

## 2011-06-27 DIAGNOSIS — E119 Type 2 diabetes mellitus without complications: Secondary | ICD-10-CM

## 2011-06-27 DIAGNOSIS — Z23 Encounter for immunization: Secondary | ICD-10-CM

## 2011-06-27 DIAGNOSIS — G4700 Insomnia, unspecified: Secondary | ICD-10-CM

## 2011-06-27 LAB — LIPID PANEL
Cholesterol: 133 mg/dL (ref 0–200)
HDL: 34 mg/dL — ABNORMAL LOW (ref >39)
LDL Cholesterol: 73 mg/dL (ref 0–99)
Triglycerides: 128 mg/dL (ref <150)
VLDL: 26 mg/dL (ref 0–40)

## 2011-06-27 LAB — HEPATIC FUNCTION PANEL
ALT: 16 U/L (ref 0–53)
Albumin: 3.8 g/dL (ref 3.5–5.2)
Alkaline Phosphatase: 62 U/L (ref 39–117)
Total Protein: 6.6 g/dL (ref 6.0–8.3)

## 2011-06-27 LAB — BASIC METABOLIC PANEL
CO2: 22 mEq/L (ref 19–32)
Calcium: 9.1 mg/dL (ref 8.4–10.5)
Creat: 1.56 mg/dL — ABNORMAL HIGH (ref 0.50–1.35)
Glucose, Bld: 126 mg/dL — ABNORMAL HIGH (ref 70–99)

## 2011-06-27 LAB — CBC
HCT: 45.2 % (ref 39.0–52.0)
Hemoglobin: 14.7 g/dL (ref 13.0–17.0)
MCH: 27.2 pg (ref 26.0–34.0)
MCV: 83.5 fL (ref 78.0–100.0)
RBC: 5.41 MIL/uL (ref 4.22–5.81)

## 2011-06-27 NOTE — Progress Notes (Signed)
  Subjective:    Patient ID: Vincent Allen, male    DOB: [birth year], 78 y.o.   MRN: [id number]  HPI Pt presents to clinic for followup of multiple medical problems. Shoulder doing well s/p surgery. Reviewed fsbs log with typical results in low 100's. Occasional mid 100's but seems to be related to carb intake. No hypoglycemia. Difficulty sustaining sleep. Wakes up every 2 hours roughly. Denies triggers. Taking no medication for the problem. Reviewed fam hx of colon cancer with last colonoscopy 9/09.  Past Medical History  Diagnosis Date  . Cataract   . GERD (gastroesophageal reflux disease)   . Diabetes mellitus   . Hypertension   . Hyperlipidemia   . Colon polyps    Past Surgical History  Procedure Date  . Cataract extraction   . Rotator cuff repair   . Ankle arthroplasty   . Tonsillectomy     reports that he has never smoked. He has never used smokeless tobacco. He reports that he does not drink alcohol or use illicit drugs. family history includes Cancer in his mother and paternal grandmother; Heart disease in his father; and Stroke in his father. Allergies  Allergen Reactions  . Penicillins       Review of Systems see hpi     Objective:   Physical Exam  Physical Exam  Nursing note and vitals reviewed. Constitutional: Appears well-developed and well-nourished. No distress.  HENT:  Head: Normocephalic and atraumatic.  Right Ear: External ear normal.  Left Ear: External ear normal.  Eyes: Conjunctivae are normal. No scleral icterus.  Neck: Neck supple. Carotid bruit is not present.  Cardiovascular: Normal rate, regular rhythm and normal heart sounds.  Exam reveals no gallop and no friction rub.   No murmur heard. Pulmonary/Chest: Effort normal and breath sounds normal. No respiratory distress. He has no wheezes. no rales.  Lymphadenopathy:    He has no cervical adenopathy.  Neurological:Alert.  Skin: Skin is warm and dry. Not diaphoretic.  Psychiatric: Has a  normal mood and affect.  Diabetic foot exam: +2 DP pulses, no diabetic wounds, ulcerations or significant callousing. Monofilament exam nl.       Assessment & Plan:

## 2011-06-27 NOTE — Assessment & Plan Note (Signed)
Obtain lipid/lft. 

## 2011-06-27 NOTE — Assessment & Plan Note (Signed)
Attempt otc valerian or melatonin. Followup if no improvement or worsening.

## 2011-06-27 NOTE — Progress Notes (Signed)
Addended by: Jiles Garter on: 06/27/2011 08:58 AM   Modules accepted: Orders

## 2011-06-27 NOTE — Assessment & Plan Note (Signed)
avg control based on fsbs. Limit carbs. Obtain cbc, chem7, a1c, urine microalbumin

## 2011-06-27 NOTE — Patient Instructions (Signed)
Please try over the counter melatonin or valerian for sleep.  Schedule fasting labs prior to next visit-chem7, a1c-250.0

## 2011-06-28 ENCOUNTER — Telehealth: Payer: Self-pay | Admitting: *Deleted

## 2011-06-28 LAB — MICROALBUMIN / CREATININE URINE RATIO: Creatinine, Urine: 116.4 mg/dL

## 2011-06-28 MED ORDER — GLUCOSE BLOOD VI STRP: ORAL_STRIP | Status: DC

## 2011-06-28 MED ORDER — ONETOUCH ULTRASOFT LANCETS MISC: Status: DC

## 2011-06-28 NOTE — Telephone Encounter (Signed)
Patient called and left voice message stating he was seen yesterday and was given a Rx for test strips and lancets, however he states [facility] is not filling the Rx for him without a diagnosis code. He requested that I contact [facility] with the diagnosis information.  Call placed to patient at [phone], he stated the pharmacy is requesting a diagnosis code for his testing supplies. He was asked what type of glucometer he is using. He stated a Onetouch. He was informed Rx would be resent to pharmacy.  Rx sent to pharmacy.

## 2011-07-02 ENCOUNTER — Telehealth: Payer: Self-pay | Admitting: Internal Medicine

## 2011-07-02 MED ORDER — ATORVASTATIN CALCIUM 20 MG PO TABS: 20.0000 mg | ORAL_TABLET | Freq: Every day | ORAL | Status: DC

## 2011-07-02 NOTE — Telephone Encounter (Signed)
Rx refill sent to pharmacy. 

## 2011-07-02 NOTE — Telephone Encounter (Signed)
Refill- atorvastatin 20mg  tablet. Take one tablet (20mg  total) by mouth daily.qty 90 last fill 1.8.13

## 2011-07-04 ENCOUNTER — Other Ambulatory Visit: Payer: Self-pay | Admitting: Internal Medicine

## 2011-07-04 NOTE — Telephone Encounter (Signed)
Call placed to [facility], spoke with Tye [place] she was informed Rx was sent to [facility], however is would be re[facility]sent to [facility].  Ra resent as stated above.

## 2011-07-05 ENCOUNTER — Other Ambulatory Visit: Payer: Self-pay | Admitting: *Deleted

## 2011-07-05 MED ORDER — GLIPIZIDE 10 MG PO TABS: 10.0000 mg | ORAL_TABLET | Freq: Two times a day (BID) | ORAL | Status: DC

## 2011-07-24 ENCOUNTER — Telehealth: Payer: Self-pay | Admitting: Internal Medicine

## 2011-07-24 MED ORDER — GLIPIZIDE 10 MG PO TABS: ORAL_TABLET | ORAL | Status: DC

## 2011-07-24 NOTE — Telephone Encounter (Signed)
Notified pt and sent refill.

## 2011-07-24 NOTE — Telephone Encounter (Signed)
Patient states that he was taking 1 pill of glipizide daily but now he is taking two pills daily. He needs the new directions with refill sent to pharmacy[facility] [place]. Patient states that he will be out of medication by the weekend.

## 2011-07-24 NOTE — Telephone Encounter (Signed)
2 bid is fine

## 2011-07-24 NOTE — Telephone Encounter (Signed)
Called pt to notify him that refill was sent to his pharmacy for 1 tablet twice a day on 07/05/11. Pt states he was told to take 2 tablets twice a day after his last lab results came back. He states that he was taking  1 1/2 tablets twice a day before that. Please advise what directions pt should be following?

## 2011-09-18 NOTE — Addendum Note (Signed)
Addended by: Kelle Darting A on: 09/18/2011 08:20 AM   Modules accepted: Orders

## 2011-09-18 NOTE — Telephone Encounter (Signed)
Pt presented to the lab, future order released. 

## 2011-09-19 LAB — BASIC METABOLIC PANEL
BUN: 31 mg/dL — ABNORMAL HIGH (ref 6–23)
CO2: 23 mEq/L (ref 19–32)
Calcium: 8.9 mg/dL (ref 8.4–10.5)
Glucose, Bld: 134 mg/dL — ABNORMAL HIGH (ref 70–99)
Potassium: 4.4 mEq/L (ref 3.5–5.3)
Sodium: 137 mEq/L (ref 135–145)

## 2011-09-22 ENCOUNTER — Other Ambulatory Visit: Payer: Self-pay | Admitting: Internal Medicine

## 2011-09-25 ENCOUNTER — Ambulatory Visit (INDEPENDENT_AMBULATORY_CARE_PROVIDER_SITE_OTHER): Payer: Medicare Other | Admitting: Internal Medicine

## 2011-09-25 ENCOUNTER — Ambulatory Visit (HOSPITAL_BASED_OUTPATIENT_CLINIC_OR_DEPARTMENT_OTHER)
Admission: RE | Admit: 2011-09-25 | Discharge: 2011-09-25 | Disposition: A | Discharge status: Home / Self Care | Payer: Medicare Other | Source: Ambulatory Visit | Attending: Internal Medicine | Admitting: Internal Medicine

## 2011-09-25 ENCOUNTER — Encounter: Payer: Self-pay | Admitting: Internal Medicine

## 2011-09-25 VITALS — BP 130/70 | HR 58 | Wt 235.0 lb

## 2011-09-25 DIAGNOSIS — E1122 Type 2 diabetes mellitus with diabetic chronic kidney disease: Secondary | ICD-10-CM

## 2011-09-25 DIAGNOSIS — R29898 Other symptoms and signs involving the musculoskeletal system: Secondary | ICD-10-CM

## 2011-09-25 DIAGNOSIS — N058 Unspecified nephritic syndrome with other morphologic changes: Secondary | ICD-10-CM

## 2011-09-25 DIAGNOSIS — N189 Chronic kidney disease, unspecified: Secondary | ICD-10-CM

## 2011-09-25 DIAGNOSIS — I10 Essential (primary) hypertension: Secondary | ICD-10-CM

## 2011-09-25 DIAGNOSIS — M899 Disorder of bone, unspecified: Secondary | ICD-10-CM | POA: Insufficient documentation

## 2011-09-25 DIAGNOSIS — M7730 Calcaneal spur, unspecified foot: Secondary | ICD-10-CM | POA: Insufficient documentation

## 2011-09-25 DIAGNOSIS — M2480 Other specific joint derangements of unspecified joint, not elsewhere classified: Secondary | ICD-10-CM

## 2011-09-25 DIAGNOSIS — M25579 Pain in unspecified ankle and joints of unspecified foot: Secondary | ICD-10-CM | POA: Insufficient documentation

## 2011-09-25 DIAGNOSIS — E1129 Type 2 diabetes mellitus with other diabetic kidney complication: Secondary | ICD-10-CM

## 2011-09-25 MED ORDER — SITAGLIPTIN PHOSPHATE 50 MG PO TABS: 50.0000 mg | ORAL_TABLET | Freq: Every day | ORAL | Status: DC

## 2011-09-25 NOTE — Patient Instructions (Signed)
Please schedule fasting labs prior to next visit Cbc, chem7, a1c-250.00 and lipid/lft-272.4

## 2011-09-25 NOTE — Assessment & Plan Note (Signed)
Normotensive and stable. Continue current regimen. Monitor bp as outpt and followup in clinic as scheduled.  

## 2011-09-25 NOTE — Assessment & Plan Note (Signed)
Add [place] 50mg  po qd and monitor fsbs daily. Reports fsbs log to clinic within a few weeks. Cautioned re possible hypoglycemia with sulfonylurea use and need for close monitoring.

## 2011-09-25 NOTE — Progress Notes (Signed)
  Subjective:    Patient ID: Vincent Allen, male    DOB: 11-16-1933, 78 y.o.   MRN: [id number]  HPI Pt presents to clinic for followup of multiple medical problems. A1c increasing and notes fsbs 30 day avg 135. No hypoglycemia. Notes remote left ankle fx with surgical placement of hardware. Denies pain but notes 2 month h/o crepitus with movement or ambulation. No injury/trauma. No other alleviating or exacerbating factors.   Past Medical History  Diagnosis Date  . Cataract   . GERD (gastroesophageal reflux disease)   . Diabetes mellitus   . Hypertension   . Hyperlipidemia   . Colon polyps    Past Surgical History  Procedure Date  . Cataract extraction   . Rotator cuff repair   . Ankle arthroplasty   . Tonsillectomy     reports that he has never smoked. He has never used smokeless tobacco. He reports that he does not drink alcohol or use illicit drugs. family history includes Cancer in his mother and paternal grandmother; Heart disease in his father; and Stroke in his father. Allergies  Allergen Reactions  . Penicillins       Review of Systems see hpi     Objective:   Physical Exam  Physical Exam  Nursing note and vitals reviewed. Constitutional: Appears well-developed and well-nourished. No distress.  HENT:  Head: Normocephalic and atraumatic.  Right Ear: External ear normal.  Left Ear: External ear normal.  Eyes: Conjunctivae are normal. No scleral icterus.  Neck: Neck supple. Carotid bruit is not present.  Cardiovascular: Normal rate, regular rhythm and normal heart sounds.  Exam reveals no gallop and no friction rub.   No murmur heard. Pulmonary/Chest: Effort normal and breath sounds normal. No respiratory distress. He has no wheezes. no rales.  Lymphadenopathy:    He has no cervical adenopathy.  Neurological:Alert.  Skin: Skin is warm and dry. Not diaphoretic.  Psychiatric: Has a normal mood and affect.  Diabetic foot exam: +2 DP pulses, no diabetic  wounds, ulcerations or significant callousing. Monofilament exam nl.  MSK: left ankle NT without erythema, warmth or effusion. +crepitus. Able to wt bear and ambulate without difficulty     Assessment & Plan:

## 2011-09-25 NOTE — Assessment & Plan Note (Signed)
Given remote hardware placement obtain plain radiograph of left ankle.

## 2011-10-08 ENCOUNTER — Encounter: Payer: Self-pay | Admitting: Internal Medicine

## 2011-12-18 ENCOUNTER — Other Ambulatory Visit: Payer: Self-pay | Admitting: Internal Medicine

## 2011-12-18 NOTE — Telephone Encounter (Signed)
Done/SLS 

## 2011-12-24 ENCOUNTER — Telehealth: Payer: Self-pay | Admitting: *Deleted

## 2011-12-24 DIAGNOSIS — E785 Hyperlipidemia, unspecified: Secondary | ICD-10-CM

## 2011-12-24 DIAGNOSIS — E119 Type 2 diabetes mellitus without complications: Secondary | ICD-10-CM

## 2011-12-24 LAB — HEPATIC FUNCTION PANEL
AST: 14 U/L (ref 0–37)
Alkaline Phosphatase: 59 U/L (ref 39–117)
Bilirubin, Direct: 0.2 mg/dL (ref 0.0–0.3)
Indirect Bilirubin: 0.5 mg/dL (ref 0.0–0.9)
Total Bilirubin: 0.7 mg/dL (ref 0.3–1.2)

## 2011-12-24 LAB — HEMOGLOBIN A1C: Hgb A1c MFr Bld: 6.9 % — ABNORMAL HIGH (ref <5.7)

## 2011-12-24 LAB — CBC WITH DIFFERENTIAL/PLATELET
Eosinophils Absolute: 0.2 10*3/uL (ref 0.0–0.7)
Hemoglobin: 14.7 g/dL (ref 13.0–17.0)
Lymphocytes Relative: 28 % (ref 12–46)
Lymphs Abs: 1.8 10*3/uL (ref 0.7–4.0)
MCH: 27.6 pg (ref 26.0–34.0)
Monocytes Relative: 10 % (ref 3–12)
Neutrophils Relative %: 58 % (ref 43–77)
RBC: 5.33 MIL/uL (ref 4.22–5.81)
WBC: 6.3 10*3/uL (ref 4.0–10.5)

## 2011-12-24 LAB — BASIC METABOLIC PANEL
CO2: 25 mEq/L (ref 19–32)
Glucose, Bld: 126 mg/dL — ABNORMAL HIGH (ref 70–99)
Potassium: 4.8 mEq/L (ref 3.5–5.3)
Sodium: 139 mEq/L (ref 135–145)

## 2011-12-24 LAB — LIPID PANEL
HDL: 25 mg/dL — ABNORMAL LOW (ref >39)
LDL Cholesterol: 69 mg/dL (ref 0–99)
Total CHOL/HDL Ratio: 5 Ratio
VLDL: 31 mg/dL (ref 0–40)

## 2011-12-24 NOTE — Telephone Encounter (Signed)
Pt presented to the lab, order entered and given to the lab.  Please schedule fasting labs prior to next visit  Cbc, chem7, a1c-250.00 and lipid/lft-272.4

## 2011-12-30 ENCOUNTER — Other Ambulatory Visit: Payer: Self-pay | Admitting: Internal Medicine

## 2011-12-31 ENCOUNTER — Encounter: Payer: Self-pay | Admitting: Internal Medicine

## 2011-12-31 ENCOUNTER — Ambulatory Visit (INDEPENDENT_AMBULATORY_CARE_PROVIDER_SITE_OTHER): Payer: Medicare Other | Admitting: Internal Medicine

## 2011-12-31 VITALS — BP 130/72 | HR 52 | Temp 97.6°F | Resp 18 | Ht 72.0 in | Wt 234.0 lb

## 2011-12-31 DIAGNOSIS — I10 Essential (primary) hypertension: Secondary | ICD-10-CM

## 2011-12-31 DIAGNOSIS — E785 Hyperlipidemia, unspecified: Secondary | ICD-10-CM

## 2011-12-31 DIAGNOSIS — E1122 Type 2 diabetes mellitus with diabetic chronic kidney disease: Secondary | ICD-10-CM

## 2011-12-31 DIAGNOSIS — N189 Chronic kidney disease, unspecified: Secondary | ICD-10-CM

## 2011-12-31 DIAGNOSIS — Z79899 Other long term (current) drug therapy: Secondary | ICD-10-CM

## 2011-12-31 DIAGNOSIS — E1129 Type 2 diabetes mellitus with other diabetic kidney complication: Secondary | ICD-10-CM

## 2011-12-31 NOTE — Assessment & Plan Note (Signed)
Normotensive and stable. Continue current regimen. Monitor bp as outpt and followup in clinic as scheduled.  

## 2011-12-31 NOTE — Assessment & Plan Note (Signed)
HDL may improve with exercise

## 2011-12-31 NOTE — Patient Instructions (Signed)
Please schedule labs prior to next visit Chem7, a1c-250.00 

## 2011-12-31 NOTE — Progress Notes (Signed)
  Subjective:    Patient ID: Vincent Allen, male    DOB: 08/20/1933, 78 y.o.   MRN: [id number]  HPI Pt presents to clinic for followup of multiple medical problems. Last visit renal dose januvia added due to increasing a1c. Tolerates well. a1c reviewed improved to 6.9. BP reviewed as normotensive. No active complaints.  Past Medical History  Diagnosis Date  . Cataract   . GERD (gastroesophageal reflux disease)   . Diabetes mellitus   . Hypertension   . Hyperlipidemia   . Colon polyps    Past Surgical History  Procedure Date  . Cataract extraction   . Rotator cuff repair   . Ankle arthroplasty   . Tonsillectomy     reports that he has never smoked. He has never used smokeless tobacco. He reports that he does not drink alcohol or use illicit drugs. family history includes Cancer in his mother and paternal grandmother; Heart disease in his father; and Stroke in his father. Allergies  Allergen Reactions  . Penicillins       Review of Systems see hpi     Objective:   Physical Exam  Physical Exam  Nursing note and vitals reviewed. Constitutional: Appears well-developed and well-nourished. No distress.  HENT:  Head: Normocephalic and atraumatic.  Right Ear: External ear normal.  Left Ear: External ear normal.  Eyes: Conjunctivae are normal. No scleral icterus.  Neck: Neck supple. Carotid bruit is not present.  Cardiovascular: Normal rate, regular rhythm and normal heart sounds.  Exam reveals no gallop and no friction rub.   No murmur heard. Pulmonary/Chest: Effort normal and breath sounds normal. No respiratory distress. He has no wheezes. no rales.  Lymphadenopathy:    He has no cervical adenopathy.  Neurological:Alert.  Skin: Skin is warm and dry. Not diaphoretic.  Psychiatric: Has a normal mood and affect.        Assessment & Plan:

## 2011-12-31 NOTE — Assessment & Plan Note (Signed)
Improved control. Continue current regimen. Recheck chem7 and a1c prior to next visit

## 2012-01-23 ENCOUNTER — Other Ambulatory Visit: Payer: Self-pay | Admitting: Internal Medicine

## 2012-02-01 ENCOUNTER — Other Ambulatory Visit: Payer: Self-pay | Admitting: Internal Medicine

## 2012-02-03 NOTE — Telephone Encounter (Signed)
Rx to pharmacy/SLS 

## 2012-02-04 ENCOUNTER — Other Ambulatory Visit: Payer: Self-pay | Admitting: Dermatology

## 2012-03-02 ENCOUNTER — Telehealth: Payer: Self-pay | Admitting: Internal Medicine

## 2012-03-02 MED ORDER — GLUCOSE BLOOD VI STRP: ORAL_STRIP | Status: DC

## 2012-03-02 NOTE — Telephone Encounter (Signed)
Refill- one touch ultra test. Test twice daily as needed. Qty 100 last fill 10.24.13

## 2012-03-02 NOTE — Telephone Encounter (Signed)
Rx to pharmacy/SLS 

## 2012-03-03 ENCOUNTER — Encounter: Payer: Self-pay | Admitting: Internal Medicine

## 2012-03-04 MED ORDER — GLUCOSE BLOOD VI STRP: ORAL_STRIP | Status: DC

## 2012-03-23 LAB — BASIC METABOLIC PANEL
Calcium: 9 mg/dL (ref 8.4–10.5)
Potassium: 4.4 mEq/L (ref 3.5–5.3)
Sodium: 137 mEq/L (ref 135–145)

## 2012-03-23 NOTE — Addendum Note (Signed)
Addended by: Rockwell Germany on: 03/23/2012 09:21 AM   Modules accepted: Orders

## 2012-03-31 ENCOUNTER — Encounter: Payer: Self-pay | Admitting: Internal Medicine

## 2012-03-31 ENCOUNTER — Ambulatory Visit (INDEPENDENT_AMBULATORY_CARE_PROVIDER_SITE_OTHER): Payer: Medicare Other | Admitting: Internal Medicine

## 2012-03-31 VITALS — BP 120/68 | HR 62 | Temp 98.6°F | Resp 18 | Wt 241.0 lb

## 2012-03-31 DIAGNOSIS — I10 Essential (primary) hypertension: Secondary | ICD-10-CM

## 2012-03-31 DIAGNOSIS — N189 Chronic kidney disease, unspecified: Secondary | ICD-10-CM

## 2012-03-31 DIAGNOSIS — E1122 Type 2 diabetes mellitus with diabetic chronic kidney disease: Secondary | ICD-10-CM

## 2012-03-31 DIAGNOSIS — E1129 Type 2 diabetes mellitus with other diabetic kidney complication: Secondary | ICD-10-CM

## 2012-03-31 NOTE — Assessment & Plan Note (Signed)
Increased a1c previously controlled. Recommend continuation of current regimen. Focus on appropriate diet, exercise and weight loss.

## 2012-03-31 NOTE — Patient Instructions (Signed)
Please schedule fasting labs prior to next visit Cbc, chem7, a1c, urine microalbumin-250.00 and lipid/lft-272.4

## 2012-03-31 NOTE — Assessment & Plan Note (Signed)
Normotensive and stable. Continue current regimen. Monitor bp as outpt and followup in clinic as scheduled.  

## 2012-03-31 NOTE — Progress Notes (Signed)
  Subjective:    Patient ID: Vincent Allen, male    DOB: 01/18/1934, 79 y.o.   MRN: [id number]  HPI Pt presents to clinic for followup of multiple medical problems. Reviewed increasing a1c. States recent dietary indiscretion over the holidays. Compliant with medication. BP reviewed as normotensive. No active complaints. Declines flu vaccine.  Past Medical History  Diagnosis Date  . Cataract   . GERD (gastroesophageal reflux disease)   . Diabetes mellitus   . Hypertension   . Hyperlipidemia   . Colon polyps    Past Surgical History  Procedure Date  . Cataract extraction   . Rotator cuff repair   . Ankle arthroplasty   . Tonsillectomy     reports that he has never smoked. He has never used smokeless tobacco. He reports that he does not drink alcohol or use illicit drugs. family history includes Cancer in his mother and paternal grandmother; Heart disease in his father; and Stroke in his father. Allergies  Allergen Reactions  . Penicillins       Review of Systems see hpi     Objective:   Physical Exam  Physical Exam  Nursing note and vitals reviewed. Constitutional: Appears well-developed and well-nourished. No distress.  HENT:  Head: Normocephalic and atraumatic.  Right Ear: External ear normal.  Left Ear: External ear normal.  Eyes: Conjunctivae are normal. No scleral icterus.  Neck: Neck supple. Carotid bruit is not present.  Cardiovascular: Normal rate, regular rhythm and normal heart sounds.  Exam reveals no gallop and no friction rub.   No murmur heard. Pulmonary/Chest: Effort normal and breath sounds normal. No respiratory distress. He has no wheezes. no rales.  Lymphadenopathy:    He has no cervical adenopathy.  Neurological:Alert.  Skin: Skin is warm and dry. Not diaphoretic.  Psychiatric: Has a normal mood and affect.        Assessment & Plan:

## 2012-04-01 ENCOUNTER — Other Ambulatory Visit: Payer: Self-pay | Admitting: Internal Medicine

## 2012-04-01 NOTE — Telephone Encounter (Signed)
Rx to pharmacy/SLS 

## 2012-05-02 ENCOUNTER — Encounter (HOSPITAL_COMMUNITY): Payer: Self-pay | Admitting: *Deleted

## 2012-05-02 ENCOUNTER — Emergency Department (HOSPITAL_COMMUNITY): Payer: Medicare Other

## 2012-05-02 ENCOUNTER — Inpatient Hospital Stay (HOSPITAL_COMMUNITY)
Admission: EM | Admit: 2012-05-02 | Discharge: 2012-05-05 | DRG: 419 | Disposition: A | Discharge status: Home / Self Care | Payer: Medicare Other | Attending: Surgery | Admitting: Surgery

## 2012-05-02 ENCOUNTER — Other Ambulatory Visit: Payer: Self-pay

## 2012-05-02 DIAGNOSIS — R109 Unspecified abdominal pain: Secondary | ICD-10-CM

## 2012-05-02 DIAGNOSIS — I447 Left bundle-branch block, unspecified: Secondary | ICD-10-CM | POA: Diagnosis present

## 2012-05-02 DIAGNOSIS — E785 Hyperlipidemia, unspecified: Secondary | ICD-10-CM | POA: Diagnosis present

## 2012-05-02 DIAGNOSIS — E119 Type 2 diabetes mellitus without complications: Secondary | ICD-10-CM | POA: Diagnosis present

## 2012-05-02 DIAGNOSIS — K8066 Calculus of gallbladder and bile duct with acute and chronic cholecystitis without obstruction: Secondary | ICD-10-CM

## 2012-05-02 DIAGNOSIS — K8000 Calculus of gallbladder with acute cholecystitis without obstruction: Principal | ICD-10-CM | POA: Diagnosis present

## 2012-05-02 DIAGNOSIS — K8020 Calculus of gallbladder without cholecystitis without obstruction: Secondary | ICD-10-CM

## 2012-05-02 DIAGNOSIS — I1310 Hypertensive heart and chronic kidney disease without heart failure, with stage 1 through stage 4 chronic kidney disease, or unspecified chronic kidney disease: Secondary | ICD-10-CM | POA: Diagnosis present

## 2012-05-02 DIAGNOSIS — I119 Hypertensive heart disease without heart failure: Secondary | ICD-10-CM | POA: Diagnosis present

## 2012-05-02 DIAGNOSIS — E1122 Type 2 diabetes mellitus with diabetic chronic kidney disease: Secondary | ICD-10-CM | POA: Diagnosis present

## 2012-05-02 DIAGNOSIS — N183 Chronic kidney disease, stage 3 unspecified: Secondary | ICD-10-CM | POA: Diagnosis present

## 2012-05-02 DIAGNOSIS — K219 Gastro-esophageal reflux disease without esophagitis: Secondary | ICD-10-CM | POA: Diagnosis present

## 2012-05-02 DIAGNOSIS — K810 Acute cholecystitis: Secondary | ICD-10-CM

## 2012-05-02 DIAGNOSIS — K819 Cholecystitis, unspecified: Secondary | ICD-10-CM

## 2012-05-02 HISTORY — DX: Chronic kidney disease, stage 3 (moderate): N18.3

## 2012-05-02 LAB — URINALYSIS, MICROSCOPIC ONLY
Glucose, UA: 250 mg/dL — AB
Leukocytes, UA: NEGATIVE
Nitrite: NEGATIVE
Protein, ur: 30 mg/dL — AB
Urobilinogen, UA: 1 mg/dL (ref 0.0–1.0)

## 2012-05-02 LAB — CBC WITH DIFFERENTIAL/PLATELET
Basophils Absolute: 0.1 10*3/uL (ref 0.0–0.1)
Eosinophils Relative: 0 % (ref 0–5)
HCT: 43.9 % (ref 39.0–52.0)
Lymphocytes Relative: 8 % — ABNORMAL LOW (ref 12–46)
MCH: 28.1 pg (ref 26.0–34.0)
MCHC: 33.9 g/dL (ref 30.0–36.0)
MCV: 82.7 fL (ref 78.0–100.0)
Monocytes Absolute: 2.1 10*3/uL — ABNORMAL HIGH (ref 0.1–1.0)
RDW: 13.1 % (ref 11.5–15.5)
WBC: 18 10*3/uL — ABNORMAL HIGH (ref 4.0–10.5)

## 2012-05-02 LAB — COMPREHENSIVE METABOLIC PANEL
AST: 11 U/L (ref 0–37)
CO2: 23 mEq/L (ref 19–32)
Calcium: 9 mg/dL (ref 8.4–10.5)
Creatinine, Ser: 1.55 mg/dL — ABNORMAL HIGH (ref 0.50–1.35)
GFR calc Af Amer: 48 mL/min — ABNORMAL LOW (ref >90)
GFR calc non Af Amer: 42 mL/min — ABNORMAL LOW (ref >90)

## 2012-05-02 MED ORDER — INSULIN ASPART 100 UNIT/ML ~~LOC~~ SOLN
0.0000 [IU] | Freq: Every day | SUBCUTANEOUS | Status: DC
  Administered 2012-05-02 – 2012-05-03 (×2): 3 [IU] via SUBCUTANEOUS

## 2012-05-02 MED ORDER — INSULIN ASPART 100 UNIT/ML ~~LOC~~ SOLN
0.0000 [IU] | Freq: Three times a day (TID) | SUBCUTANEOUS | Status: DC
  Administered 2012-05-03: 3 [IU] via SUBCUTANEOUS
  Administered 2012-05-03: 5 [IU] via SUBCUTANEOUS
  Administered 2012-05-04: 3 [IU] via SUBCUTANEOUS
  Administered 2012-05-04: 2 [IU] via SUBCUTANEOUS
  Administered 2012-05-04: 5 [IU] via SUBCUTANEOUS

## 2012-05-02 MED ORDER — MORPHINE SULFATE 4 MG/ML IJ SOLN
4.0000 mg | Freq: Once | INTRAMUSCULAR | Status: AC
  Administered 2012-05-02: 4 mg via INTRAVENOUS
  Filled 2012-05-02: qty 1

## 2012-05-02 MED ORDER — ACETAMINOPHEN 650 MG RE SUPP: 650.0000 mg | Freq: Four times a day (QID) | RECTAL | Status: DC | PRN

## 2012-05-02 MED ORDER — ONDANSETRON HCL 4 MG/2ML IJ SOLN
4.0000 mg | Freq: Once | INTRAMUSCULAR | Status: AC
  Administered 2012-05-02: 4 mg via INTRAVENOUS
  Filled 2012-05-02: qty 2

## 2012-05-02 MED ORDER — VERAPAMIL HCL ER 240 MG PO TBCR
240.0000 mg | EXTENDED_RELEASE_TABLET | Freq: Every day | ORAL | Status: DC
  Administered 2012-05-02 – 2012-05-03 (×2): 240 mg via ORAL
  Filled 2012-05-02 (×2): qty 1

## 2012-05-02 MED ORDER — ACETAMINOPHEN 325 MG PO TABS: 650.0000 mg | ORAL_TABLET | Freq: Four times a day (QID) | ORAL | Status: DC | PRN

## 2012-05-02 MED ORDER — PANTOPRAZOLE SODIUM 40 MG IV SOLR
40.0000 mg | Freq: Every day | INTRAVENOUS | Status: DC
  Administered 2012-05-02 – 2012-05-03 (×2): 40 mg via INTRAVENOUS
  Filled 2012-05-02 (×2): qty 40

## 2012-05-02 MED ORDER — HYDROCODONE-ACETAMINOPHEN 5-325 MG PO TABS: 1.0000 | ORAL_TABLET | ORAL | Status: DC | PRN

## 2012-05-02 MED ORDER — SODIUM CHLORIDE 0.9 % IV BOLUS (SEPSIS)
500.0000 mL | Freq: Once | INTRAVENOUS | Status: AC
  Administered 2012-05-02: 500 mL via INTRAVENOUS

## 2012-05-02 MED ORDER — ENOXAPARIN SODIUM 30 MG/0.3ML ~~LOC~~ SOLN
30.0000 mg | SUBCUTANEOUS | Status: DC
  Administered 2012-05-02 – 2012-05-04 (×3): 30 mg via SUBCUTANEOUS
  Filled 2012-05-02 (×4): qty 0.3

## 2012-05-02 MED ORDER — CIPROFLOXACIN IN D5W 400 MG/200ML IV SOLN
400.0000 mg | Freq: Two times a day (BID) | INTRAVENOUS | Status: DC
  Administered 2012-05-03 – 2012-05-05 (×5): 400 mg via INTRAVENOUS
  Filled 2012-05-02 (×7): qty 200

## 2012-05-02 MED ORDER — CIPROFLOXACIN IN D5W 400 MG/200ML IV SOLN: 400.0000 mg | Freq: Once | INTRAVENOUS | Status: DC

## 2012-05-02 MED ORDER — METRONIDAZOLE IN NACL 5-0.79 MG/ML-% IV SOLN
500.0000 mg | Freq: Once | INTRAVENOUS | Status: AC
  Administered 2012-05-02: 500 mg via INTRAVENOUS
  Filled 2012-05-02: qty 100

## 2012-05-02 MED ORDER — ONDANSETRON HCL 4 MG/2ML IJ SOLN
4.0000 mg | Freq: Four times a day (QID) | INTRAMUSCULAR | Status: DC | PRN
  Administered 2012-05-03: 4 mg via INTRAVENOUS
  Filled 2012-05-02: qty 2

## 2012-05-02 MED ORDER — MORPHINE SULFATE 2 MG/ML IJ SOLN
2.0000 mg | INTRAMUSCULAR | Status: DC | PRN
  Administered 2012-05-03 (×3): 2 mg via INTRAVENOUS
  Filled 2012-05-02 (×3): qty 1

## 2012-05-02 MED ORDER — LISINOPRIL 5 MG PO TABS
5.0000 mg | ORAL_TABLET | Freq: Every day | ORAL | Status: DC
  Administered 2012-05-02 – 2012-05-05 (×4): 5 mg via ORAL
  Filled 2012-05-02 (×4): qty 1

## 2012-05-02 MED ORDER — SODIUM CHLORIDE 0.9 % IV SOLN
INTRAVENOUS | Status: DC
  Administered 2012-05-02 – 2012-05-04 (×4): via INTRAVENOUS

## 2012-05-02 NOTE — ED Notes (Signed)
Dr. Steinl at bedside 

## 2012-05-02 NOTE — ED Notes (Signed)
Patient transported to CT 

## 2012-05-02 NOTE — ED Notes (Signed)
Pt returned from [facility]

## 2012-05-02 NOTE — ED Notes (Signed)
Pt transported to [facility]

## 2012-05-02 NOTE — ED Notes (Signed)
Reports right side lower back pain that radiates around to abd. Had episode of pain on wed but then started again last night. Reports n/v. Denies diarrhea, last bm was yesterday. Denies urinary symptoms.

## 2012-05-02 NOTE — H&P (Signed)
Vincent Allen is an 79 y.o. male.   Chief Complaint: Right-sided abdominal pain HPI: This is a 79 year old gentleman who presents with a 2 to three-day history of right-sided abdominal pain. He reports that his home gradually. It is now sharp and constant although it had been intermittent. It is in the right upper quadrant as well as through to the back. It is moderate to severe in intensity. He has no previous history of similar abdominal pain. Bowel movements have been normal. He has had no nausea or vomiting. He is otherwise without complaints.  Past Medical History  Diagnosis Date  . Cataract   . GERD (gastroesophageal reflux disease)   . Diabetes mellitus   . Hypertension   . Hyperlipidemia   . Colon polyps     Past Surgical History  Procedure Laterality Date  . Cataract extraction    . Rotator cuff repair    . Ankle arthroplasty    . Tonsillectomy      Family History  Problem Relation Age of Onset  . Cancer Mother     colon, breast  . Heart disease Father   . Stroke Father   . Cancer Paternal Grandmother     colon   Social History:  reports that he has never smoked. He has never used smokeless tobacco. He reports that he does not drink alcohol or use illicit drugs.  Allergies:  Allergies  Allergen Reactions  . Penicillins Hives and Itching     (Not in a hospital admission)  Results for orders placed during the hospital encounter of 05/02/12 (from the past 48 hour(s))  CBC WITH DIFFERENTIAL     Status: Abnormal   Collection Time    05/02/12 10:05 AM      Result Value Range   WBC 18.0 (*) 4.0 - 10.5 K/uL   RBC 5.31  4.22 - 5.81 MIL/uL   Hemoglobin 14.9  13.0 - 17.0 g/dL   HCT 43.9  39.0 - 52.0 %   MCV 82.7  78.0 - 100.0 fL   MCH 28.1  26.0 - 34.0 pg   MCHC 33.9  30.0 - 36.0 g/dL   RDW 13.1  11.5 - 15.5 %   Platelets 169  150 - 400 K/uL   Neutrophils Relative 80 (*) 43 - 77 %   Neutro Abs 14.5 (*) 1.7 - 7.7 K/uL   Lymphocytes Relative 8 (*) 12 - 46 %    Lymphs Abs 1.4  0.7 - 4.0 K/uL   Monocytes Relative 12  3 - 12 %   Monocytes Absolute 2.1 (*) 0.1 - 1.0 K/uL   Eosinophils Relative 0  0 - 5 %   Eosinophils Absolute 0.0  0.0 - 0.7 K/uL   Basophils Relative 0  0 - 1 %   Basophils Absolute 0.1  0.0 - 0.1 K/uL  COMPREHENSIVE METABOLIC PANEL     Status: Abnormal   Collection Time    05/02/12 10:05 AM      Result Value Range   Sodium 134 (*) 135 - 145 mEq/L   Potassium 4.3  3.5 - 5.1 mEq/L   Chloride 99  96 - 112 mEq/L   CO2 23  19 - 32 mEq/L   Glucose, Bld 270 (*) 70 - 99 mg/dL   BUN 22  6 - 23 mg/dL   Creatinine, Ser 1.55 (*) 0.50 - 1.35 mg/dL   Calcium 9.0  8.4 - 10.5 mg/dL   Total Protein 7.2  6.0 - 8.3 g/dL  Albumin 3.1 (*) 3.5 - 5.2 g/dL   AST 11  0 - 37 U/L   ALT 12  0 - 53 U/L   Alkaline Phosphatase 67  39 - 117 U/L   Total Bilirubin 1.1  0.3 - 1.2 mg/dL   GFR calc non Af Amer 42 (*) >90 mL/min   GFR calc Af Amer 48 (*) >90 mL/min   Comment:            The eGFR has been calculated     using the CKD EPI equation.     This calculation has not been     validated in all clinical     situations.     eGFR's persistently     <90 mL/min signify     possible Chronic Kidney Disease.  LIPASE, BLOOD     Status: None   Collection Time    05/02/12 10:05 AM      Result Value Range   Lipase 38  11 - 59 U/L  URINALYSIS, MICROSCOPIC ONLY     Status: Abnormal   Collection Time    05/02/12 11:42 AM      Result Value Range   Color, Urine YELLOW  YELLOW   APPearance CLEAR  CLEAR   Specific Gravity, Urine 1.019  1.005 - 1.030   pH 5.5  5.0 - 8.0   Glucose, UA 250 (*) NEGATIVE mg/dL   Hgb urine dipstick NEGATIVE  NEGATIVE   Bilirubin Urine NEGATIVE  NEGATIVE   Ketones, ur NEGATIVE  NEGATIVE mg/dL   Protein, ur 30 (*) NEGATIVE mg/dL   Urobilinogen, UA 1.0  0.0 - 1.0 mg/dL   Nitrite NEGATIVE  NEGATIVE   Leukocytes, UA NEGATIVE  NEGATIVE   WBC, UA 0-2  <3 WBC/hpf   Squamous Epithelial / LPF RARE  RARE   Ct Abdomen Pelvis Wo  Contrast  05/02/2012  *RADIOLOGY REPORT*  Clinical Data: Right flank pain  CT ABDOMEN AND PELVIS WITHOUT CONTRAST  Technique:  Multidetector CT imaging of the abdomen and pelvis was performed following the standard protocol without intravenous contrast.  Comparison: None.  Findings: Normal gas filled appendix.  Kidneys are atrophic bilaterally without hydronephrosis.  No definite ureteral calculi.  Multiple phleboliths project over the pelvis.  Bibasilar atelectasis.  Unenhanced liver, gallbladder, spleen, pancreas, adrenal glands within normal limits.  Sigmoid diverticulosis without evidence of acute diverticulitis.  No free fluid.  Negative for abnormal adenopathy.  Bladder and prostate are within normal limits.  Advanced degenerative changes in the lumbar spine with multilevel spinal stenosis.  No vertebral compression deformity.  IMPRESSION: No evidence of ureteral obstruction.  Normal appendix.  Advanced degenerative changes in the lumbar spine.   Original Report Authenticated By: Marybelle Killings, M.D.    US Abdomen Complete  05/02/2012  *RADIOLOGY REPORT*  Clinical Data:  Right upper quadrant pain.  Rule out gallstones, cholecystitis.  COMPLETE ABDOMINAL ULTRASOUND  Comparison:  CT 05/02/2012  Findings:  Gallbladder:  Sludge noted within the gallbladder.  Small echogenic stones, the largest 6 mm.  Gallbladder wall is thickened at 9 mm. Negative sonographic Murphy's.  Common bile duct:   Normal caliber, 3 mm.  Liver:  No focal lesion identified.  Within normal limits in parenchymal echogenicity.  IVC:  Not visualized due to overlying bowel gas.  Pancreas:  Not visualized due to overlying bowel gas.  Spleen:  Within normal limits in size and echotexture.  Right Kidney:   Mild cortical thinning.  No hydronephrosis or focal abnormality.  Left  Kidney:  Mild cortical thinning and increased echotexture.  No focal abnormality or hydronephrosis.  Abdominal aorta:  Visualized portions non-aneurysmal.  IMPRESSION:  Gallstones and sludge within the gallbladder.  Gallbladder wall thickening without sonographic Murphy's sign.  Findings suggestive of chronic cholecystitis.   Original Report Authenticated By: Rolm Baptise, M.D.     Review of Systems  Constitutional: Negative for fever and chills.  Eyes: Negative for blurred vision.  Respiratory: Negative for cough and shortness of breath.   Cardiovascular: Negative for chest pain, palpitations and leg swelling.  Gastrointestinal: Positive for abdominal pain. Negative for nausea, vomiting, diarrhea and constipation.  Genitourinary: Negative.   Musculoskeletal: Negative.   Skin: Negative.   Neurological: Negative.  Negative for weakness and headaches.    Blood pressure 153/65, pulse 64, temperature 98.7 F (37.1 C), temperature source Oral, resp. rate 18, SpO2 97.00%. Physical Exam  Constitutional: He is oriented to person, place, and time. He appears well-developed and well-nourished. No distress.  HENT:  Head: Normocephalic and atraumatic.  Right Ear: External ear normal.  Left Ear: External ear normal.  Nose: Nose normal.  Mouth/Throat: Oropharynx is clear and moist. No oropharyngeal exudate.  Eyes: Conjunctivae are normal. Pupils are equal, round, and reactive to light. Right eye exhibits no discharge. Left eye exhibits no discharge. No scleral icterus.  Neck: Normal range of motion. Neck supple. No tracheal deviation present. No thyromegaly present.  Cardiovascular: Normal rate, regular rhythm, normal heart sounds and intact distal pulses.   No murmur heard. Respiratory: Effort normal and breath sounds normal. No respiratory distress. He has no wheezes.  GI: Soft. Bowel sounds are normal. There is tenderness. There is guarding.  There is tenderness with guarding in the right upper quadrant  Musculoskeletal: Normal range of motion. He exhibits no edema and no tenderness.  Lymphadenopathy:    He has no cervical adenopathy.  Neurological: He is  alert and oriented to person, place, and time.  Skin: Skin is warm and dry. No rash noted. He is not diaphoretic. No erythema.  Psychiatric: His behavior is normal. Judgment normal.     Assessment/Plan Acute cholecystitis with cholelithiasis  He will be admitted to the hospital for bowel rest, IV rehydration, and IV antibiotics. Hopefully, we can proceed with a laparoscopic cholecystectomy in the next 24 to 48-hours.  I discussed the surgical procedure with him briefly. He will be placed on a sliding scale insulin for his diabetes. Her preoperative chest x-ray will also be performed.  Cary Wilford A 05/02/2012, 6:26 PM

## 2012-05-02 NOTE — ED Notes (Signed)
Pt c/o of back pain that radiates to rt abd that started wed night. Pt describes pain as sharp shooting pain, pain in back constant, abd pain intermittent. Pt rates pain 8/10. Pt tried alieve but it did not decrease pain. Pt has vomiting, denies diarrhea.

## 2012-05-02 NOTE — ED Provider Notes (Signed)
History     CSN: [id number]  Arrival date & time 05/02/12  [id number]   First MD Initiated Contact with Patient 05/02/12 1147      Chief Complaint  Patient presents with  . Back Pain  . Abdominal Pain    (Consider location/radiation/quality/duration/timing/severity/associated sxs/prior treatment) Patient is a 79 y.o. male presenting with back pain and abdominal pain. The history is provided by the patient.  Back Pain Associated symptoms: abdominal pain   Associated symptoms: no chest pain, no fever and no headaches   Abdominal Pain Associated symptoms: no chest pain, no fever and no shortness of breath   pt c/o right sided back/flank and abd pain for past 2-3 days. Constant, waxes and wanes in intensity. Moderate. No specific exacerbating or alleviating factors. No injury. No hx same pain. No prior abd surgery. No dysuria or hematuria. No hx kidney stones. No hx gallstones. No scrotal or testicular pain. No rash/lesions to area. No fever or chills. Is eating normally. Occasional nausea. No vomiting. No diarrhea or constipation.   Past Medical History  Diagnosis Date  . Cataract   . GERD (gastroesophageal reflux disease)   . Diabetes mellitus   . Hypertension   . Hyperlipidemia   . Colon polyps     Past Surgical History  Procedure Laterality Date  . Cataract extraction    . Rotator cuff repair    . Ankle arthroplasty    . Tonsillectomy      Family History  Problem Relation Age of Onset  . Cancer Mother     colon, breast  . Heart disease Father   . Stroke Father   . Cancer Paternal Grandmother     colon    History  Substance Use Topics  . Smoking status: Never Smoker   . Smokeless tobacco: Never Used  . Alcohol Use: No      Review of Systems  Constitutional: Negative for fever.  HENT: Negative for neck pain.   Eyes: Negative for redness.  Respiratory: Negative for shortness of breath.   Cardiovascular: Negative for chest pain.  Gastrointestinal: Positive for  abdominal pain.  Endocrine: Negative for polyuria.  Genitourinary: Negative for flank pain.  Musculoskeletal: Positive for back pain.  Skin: Negative for rash.  Neurological: Negative for headaches.  Hematological: Does not bruise/bleed easily.  Psychiatric/Behavioral: Negative for confusion.    Allergies  Penicillins  Home Medications   Current Outpatient Rx  Name  Route  Sig  Dispense  Refill  . aspirin EC 81 MG tablet   Oral   Take 81 mg by mouth daily.         Marland Kitchen atorvastatin (LIPITOR) 20 MG tablet      TAKE 1 TABLET BY MOUTH DAILY   90 tablet   1   . Calcium 600 MG tablet   Oral   Take 1 tablet (600 mg total) by mouth 2 (two) times daily.   60 tablet   0   . cholecalciferol (GNP VITAMIN D) 400 UNITS TABS   Oral   Take 1 tablet (400 Units total) by mouth 2 (two) times daily.   30 each   0   . glipiZIDE (GLUCOTROL) 10 MG tablet      TAKE 2 TABLETS BY MOUTH TWICE A DAY BEFORE MEALS   360 tablet   1   . lisinopril (PRINIVIL,ZESTRIL) 5 MG tablet      TAKE 1 TABLET BY MOUTH EVERY DAY   90 tablet   1   .  Naproxen Sodium (ALEVE) 220 MG CAPS   Oral   Take 2 capsules by mouth 2 (two) times daily as needed (pain).         Marland Kitchen sitaGLIPtin (JANUVIA) 50 MG tablet   Oral   Take 1 tablet (50 mg total) by mouth daily.   30 tablet   6   . triamterene-hydrochlorothiazide (MAXZIDE) 75-50 MG per tablet      TAKE 1 TABLET BY MOUTH EVERY DAY   90 tablet   1   . verapamil (CALAN-SR) 240 MG CR tablet      TAKE 1 TABLET BY MOUTH AT BEDTIME   90 tablet   1   . glucose blood test strip      OneTouch Ultra. Use as instructed to check blood sugar twice a day as needed  Dx Code 250.00   100 each   3   . Lancets (ONETOUCH ULTRASOFT) lancets      Use as instructed to check blood sugar twice a day as needed Dx Code 250.00   100 each   3     BP 172/79  Pulse 65  Temp(Src) 97.4 F (36.3 C) (Oral)  Resp 18  SpO2 97%  Physical Exam  Nursing note and  vitals reviewed. Constitutional: He is oriented to person, place, and time. He appears well-developed and well-nourished. No distress.  HENT:  Head: Atraumatic.  Mouth/Throat: Oropharynx is clear and moist.  Eyes: Pupils are equal, round, and reactive to light. No scleral icterus.  Neck: Neck supple. No tracheal deviation present.  Cardiovascular: Normal rate, regular rhythm, normal heart sounds and intact distal pulses.   Pulmonary/Chest: Effort normal and breath sounds normal. No accessory muscle usage. No respiratory distress.  Abdominal: Soft. Bowel sounds are normal. He exhibits no distension.  Genitourinary:  No cva tenderness  Musculoskeletal: Normal range of motion. He exhibits no edema and no tenderness.  Neurological: He is alert and oriented to person, place, and time.  Skin: Skin is warm and dry. No rash noted.  No rash/shingles to area of pain  Psychiatric: He has a normal mood and affect.    ED Course  Procedures (including critical care time)  Results for orders placed during the hospital encounter of 05/02/12  CBC WITH DIFFERENTIAL      Result Value Range   WBC 18.0 (*) 4.0 - 10.5 K/uL   RBC 5.31  4.22 - 5.81 MIL/uL   Hemoglobin 14.9  13.0 - 17.0 g/dL   HCT 43.9  39.0 - 52.0 %   MCV 82.7  78.0 - 100.0 fL   MCH 28.1  26.0 - 34.0 pg   MCHC 33.9  30.0 - 36.0 g/dL   RDW 13.1  11.5 - 15.5 %   Platelets 169  150 - 400 K/uL   Neutrophils Relative 80 (*) 43 - 77 %   Neutro Abs 14.5 (*) 1.7 - 7.7 K/uL   Lymphocytes Relative 8 (*) 12 - 46 %   Lymphs Abs 1.4  0.7 - 4.0 K/uL   Monocytes Relative 12  3 - 12 %   Monocytes Absolute 2.1 (*) 0.1 - 1.0 K/uL   Eosinophils Relative 0  0 - 5 %   Eosinophils Absolute 0.0  0.0 - 0.7 K/uL   Basophils Relative 0  0 - 1 %   Basophils Absolute 0.1  0.0 - 0.1 K/uL  COMPREHENSIVE METABOLIC PANEL      Result Value Range   Sodium 134 (*) 135 - 145 mEq/L  Potassium 4.3  3.5 - 5.1 mEq/L   Chloride 99  96 - 112 mEq/L   CO2 23  19 - 32  mEq/L   Glucose, Bld 270 (*) 70 - 99 mg/dL   BUN 22  6 - 23 mg/dL   Creatinine, Ser 1.55 (*) 0.50 - 1.35 mg/dL   Calcium 9.0  8.4 - 10.5 mg/dL   Total Protein 7.2  6.0 - 8.3 g/dL   Albumin 3.1 (*) 3.5 - 5.2 g/dL   AST 11  0 - 37 U/L   ALT 12  0 - 53 U/L   Alkaline Phosphatase 67  39 - 117 U/L   Total Bilirubin 1.1  0.3 - 1.2 mg/dL   GFR calc non Af Amer 42 (*) >90 mL/min   GFR calc Af Amer 48 (*) >90 mL/min  LIPASE, BLOOD      Result Value Range   Lipase 38  11 - 59 U/L  URINALYSIS, MICROSCOPIC ONLY      Result Value Range   Color, Urine YELLOW  YELLOW   APPearance CLEAR  CLEAR   Specific Gravity, Urine 1.019  1.005 - 1.030   pH 5.5  5.0 - 8.0   Glucose, UA 250 (*) NEGATIVE mg/dL   Hgb urine dipstick NEGATIVE  NEGATIVE   Bilirubin Urine NEGATIVE  NEGATIVE   Ketones, ur NEGATIVE  NEGATIVE mg/dL   Protein, ur 30 (*) NEGATIVE mg/dL   Urobilinogen, UA 1.0  0.0 - 1.0 mg/dL   Nitrite NEGATIVE  NEGATIVE   Leukocytes, UA NEGATIVE  NEGATIVE   WBC, UA 0-2  <3 WBC/hpf   Squamous Epithelial / LPF RARE  RARE   Ct Abdomen Pelvis Wo Contrast  05/02/2012  *RADIOLOGY REPORT*  Clinical Data: Right flank pain  CT ABDOMEN AND PELVIS WITHOUT CONTRAST  Technique:  Multidetector CT imaging of the abdomen and pelvis was performed following the standard protocol without intravenous contrast.  Comparison: None.  Findings: Normal gas filled appendix.  Kidneys are atrophic bilaterally without hydronephrosis.  No definite ureteral calculi.  Multiple phleboliths project over the pelvis.  Bibasilar atelectasis.  Unenhanced liver, gallbladder, spleen, pancreas, adrenal glands within normal limits.  Sigmoid diverticulosis without evidence of acute diverticulitis.  No free fluid.  Negative for abnormal adenopathy.  Bladder and prostate are within normal limits.  Advanced degenerative changes in the lumbar spine with multilevel spinal stenosis.  No vertebral compression deformity.  IMPRESSION: No evidence of ureteral  obstruction.  Normal appendix.  Advanced degenerative changes in the lumbar spine.   Original Report Authenticated By: Marybelle Killings, M.D.    US Abdomen Complete  05/02/2012  *RADIOLOGY REPORT*  Clinical Data:  Right upper quadrant pain.  Rule out gallstones, cholecystitis.  COMPLETE ABDOMINAL ULTRASOUND  Comparison:  CT 05/02/2012  Findings:  Gallbladder:  Sludge noted within the gallbladder.  Small echogenic stones, the largest 6 mm.  Gallbladder wall is thickened at 9 mm. Negative sonographic Murphy's.  Common bile duct:   Normal caliber, 3 mm.  Liver:  No focal lesion identified.  Within normal limits in parenchymal echogenicity.  IVC:  Not visualized due to overlying bowel gas.  Pancreas:  Not visualized due to overlying bowel gas.  Spleen:  Within normal limits in size and echotexture.  Right Kidney:   Mild cortical thinning.  No hydronephrosis or focal abnormality.  Left Kidney:  Mild cortical thinning and increased echotexture.  No focal abnormality or hydronephrosis.  Abdominal aorta:  Visualized portions non-aneurysmal.  IMPRESSION: Gallstones and sludge within  the gallbladder.  Gallbladder wall thickening without sonographic Murphy's sign.  Findings suggestive of chronic cholecystitis.   Original Report Authenticated By: Rolm Baptise, M.D.        MDM  Iv ns bolus. zofran iv. Morphine iv.  Reviewed nursing notes and prior charts for additional history.    Persistent ruq tenderness on exam.  [place] c/w cholecystitis.  ?acute as pts symptoms present only in past few days, no hx same, ruq tenderness, elevated wbc.  cipro and flagyl iv.  Morphine for pain.  Discussed w Dr Ninfa Linden on call for general surgery, he will admit.   Discussed ct, u/s, labs w pt.        Mirna Mires, MD 05/02/12 [phone]

## 2012-05-02 NOTE — ED Notes (Signed)
Pt returned from CT °

## 2012-05-03 ENCOUNTER — Inpatient Hospital Stay (HOSPITAL_COMMUNITY): Payer: Medicare Other

## 2012-05-03 ENCOUNTER — Encounter (HOSPITAL_COMMUNITY): Payer: Self-pay | Admitting: Anesthesiology

## 2012-05-03 ENCOUNTER — Encounter (HOSPITAL_COMMUNITY): Payer: Self-pay | Admitting: Cardiology

## 2012-05-03 ENCOUNTER — Encounter (HOSPITAL_COMMUNITY): Admission: EM | Disposition: A | Discharge status: Home / Self Care | Payer: Self-pay | Source: Home / Self Care

## 2012-05-03 ENCOUNTER — Inpatient Hospital Stay (HOSPITAL_COMMUNITY): Payer: Medicare Other | Admitting: Anesthesiology

## 2012-05-03 DIAGNOSIS — N183 Chronic kidney disease, stage 3 unspecified: Secondary | ICD-10-CM | POA: Diagnosis present

## 2012-05-03 HISTORY — DX: Chronic kidney disease, stage 3 unspecified: N18.30

## 2012-05-03 HISTORY — PX: CHOLECYSTECTOMY: SHX55

## 2012-05-03 LAB — COMPREHENSIVE METABOLIC PANEL
ALT: 10 U/L (ref 0–53)
AST: 9 U/L (ref 0–37)
Albumin: 2.4 g/dL — ABNORMAL LOW (ref 3.5–5.2)
Alkaline Phosphatase: 63 U/L (ref 39–117)
Potassium: 4.4 mEq/L (ref 3.5–5.1)
Sodium: 134 mEq/L — ABNORMAL LOW (ref 135–145)
Total Protein: 6.1 g/dL (ref 6.0–8.3)

## 2012-05-03 LAB — GLUCOSE, CAPILLARY
Glucose-Capillary: 267 mg/dL — ABNORMAL HIGH (ref 70–99)
Glucose-Capillary: 245 mg/dL — ABNORMAL HIGH (ref 70–99)

## 2012-05-03 LAB — CBC
HCT: 39.5 % (ref 39.0–52.0)
HCT: 40.5 % (ref 39.0–52.0)
Hemoglobin: 13.5 g/dL (ref 13.0–17.0)
MCH: 27.1 pg (ref 26.0–34.0)
MCH: 27.8 pg (ref 26.0–34.0)
MCHC: 32.7 g/dL (ref 30.0–36.0)
MCHC: 33.3 g/dL (ref 30.0–36.0)
MCV: 83 fL (ref 78.0–100.0)
MCV: 83.3 fL (ref 78.0–100.0)
RDW: 13.3 % (ref 11.5–15.5)

## 2012-05-03 SURGERY — LAPAROSCOPIC CHOLECYSTECTOMY WITH INTRAOPERATIVE CHOLANGIOGRAM
Anesthesia: General | Site: Abdomen | Wound class: Clean Contaminated

## 2012-05-03 MED ORDER — ONDANSETRON HCL 4 MG/2ML IJ SOLN: 4.0000 mg | Freq: Four times a day (QID) | INTRAMUSCULAR | Status: DC | PRN

## 2012-05-03 MED ORDER — LACTATED RINGERS IV SOLN
INTRAVENOUS | Status: DC | PRN
  Administered 2012-05-03: 12:00:00 via INTRAVENOUS

## 2012-05-03 MED ORDER — SODIUM CHLORIDE 0.9 % IR SOLN
Status: DC | PRN
  Administered 2012-05-03: 2000 mL

## 2012-05-03 MED ORDER — ROCURONIUM BROMIDE 100 MG/10ML IV SOLN
INTRAVENOUS | Status: DC | PRN
  Administered 2012-05-03: 50 mg via INTRAVENOUS

## 2012-05-03 MED ORDER — LIDOCAINE HCL (CARDIAC) 20 MG/ML IV SOLN
INTRAVENOUS | Status: DC | PRN
  Administered 2012-05-03: 80 mg via INTRAVENOUS

## 2012-05-03 MED ORDER — OXYCODONE HCL 5 MG PO TABS: 5.0000 mg | ORAL_TABLET | Freq: Once | ORAL | Status: DC | PRN

## 2012-05-03 MED ORDER — FENTANYL CITRATE 0.05 MG/ML IJ SOLN: 25.0000 ug | INTRAMUSCULAR | Status: DC | PRN

## 2012-05-03 MED ORDER — GLYCOPYRROLATE 0.2 MG/ML IJ SOLN
INTRAMUSCULAR | Status: DC | PRN
  Administered 2012-05-03: 0.6 mg via INTRAVENOUS

## 2012-05-03 MED ORDER — ONDANSETRON HCL 4 MG/2ML IJ SOLN
INTRAMUSCULAR | Status: DC | PRN
  Administered 2012-05-03: 4 mg via INTRAVENOUS

## 2012-05-03 MED ORDER — FENTANYL CITRATE 0.05 MG/ML IJ SOLN
INTRAMUSCULAR | Status: DC | PRN
  Administered 2012-05-03: 100 ug via INTRAVENOUS
  Administered 2012-05-03: 50 ug via INTRAVENOUS

## 2012-05-03 MED ORDER — OXYCODONE HCL 5 MG/5ML PO SOLN: 5.0000 mg | Freq: Once | ORAL | Status: DC | PRN

## 2012-05-03 MED ORDER — EPHEDRINE SULFATE 50 MG/ML IJ SOLN
INTRAMUSCULAR | Status: DC | PRN
  Administered 2012-05-03 (×2): 10 mg via INTRAVENOUS

## 2012-05-03 MED ORDER — NEOSTIGMINE METHYLSULFATE 1 MG/ML IJ SOLN
INTRAMUSCULAR | Status: DC | PRN
  Administered 2012-05-03: 4 mg via INTRAVENOUS

## 2012-05-03 MED ORDER — SODIUM CHLORIDE 0.9 % IV SOLN
INTRAVENOUS | Status: DC | PRN
  Administered 2012-05-03: 15:00:00

## 2012-05-03 MED ORDER — PROPOFOL 10 MG/ML IV BOLUS
INTRAVENOUS | Status: DC | PRN
  Administered 2012-05-03: 150 mg via INTRAVENOUS

## 2012-05-03 MED ORDER — BUPIVACAINE-EPINEPHRINE 0.25% -1:200000 IJ SOLN
INTRAMUSCULAR | Status: DC | PRN
  Administered 2012-05-03: 20 mL

## 2012-05-03 MED ORDER — PHENYLEPHRINE HCL 10 MG/ML IJ SOLN
INTRAMUSCULAR | Status: DC | PRN
  Administered 2012-05-03: 80 ug via INTRAVENOUS

## 2012-05-03 MED ORDER — SODIUM CHLORIDE 0.9 % IR SOLN
Status: DC | PRN
  Administered 2012-05-03: 1

## 2012-05-03 SURGICAL SUPPLY — 48 items
ADH SKN CLS APL DERMABOND .7 (GAUZE/BANDAGES/DRESSINGS) ×1
ADH SKN CLS LQ APL DERMABOND (GAUZE/BANDAGES/DRESSINGS) ×1
APPLIER CLIP 5 13 M/L LIGAMAX5 (MISCELLANEOUS) ×2
APPLIER CLIP ROT 10 11.4 M/L (STAPLE)
APR CLP MED LRG 11.4X10 (STAPLE)
APR CLP MED LRG 5 ANG JAW (MISCELLANEOUS) ×1
BAG SPEC RTRVL LRG 6X4 10 (ENDOMECHANICALS)
BLADE SURG ROTATE 9660 (MISCELLANEOUS) IMPLANT
CANISTER SUCTION 2500CC (MISCELLANEOUS) ×2 IMPLANT
CHLORAPREP W/TINT 26ML (MISCELLANEOUS) ×2 IMPLANT
CLIP APPLIE 5 13 M/L LIGAMAX5 (MISCELLANEOUS) ×1 IMPLANT
CLIP APPLIE ROT 10 11.4 M/L (STAPLE) IMPLANT
CLOTH BEACON ORANGE TIMEOUT ST (SAFETY) ×2 IMPLANT
CLSR STERI-STRIP ANTIMIC 1/2X4 (GAUZE/BANDAGES/DRESSINGS) ×2 IMPLANT
COVER MAYO STAND STRL (DRAPES) ×2 IMPLANT
COVER SURGICAL LIGHT HANDLE (MISCELLANEOUS) ×2 IMPLANT
DECANTER SPIKE VIAL GLASS SM (MISCELLANEOUS) ×4 IMPLANT
DERMABOND ADHESIVE PROPEN (GAUZE/BANDAGES/DRESSINGS) ×1
DERMABOND ADVANCED (GAUZE/BANDAGES/DRESSINGS) ×1
DERMABOND ADVANCED .7 DNX12 (GAUZE/BANDAGES/DRESSINGS) ×1 IMPLANT
DERMABOND ADVANCED .7 DNX6 (GAUZE/BANDAGES/DRESSINGS) IMPLANT
DRAPE C-ARM 42X72 X-RAY (DRAPES) ×2 IMPLANT
DRAPE UTILITY 15X26 W/TAPE STR (DRAPE) ×4 IMPLANT
DRSG TEGADERM 4X4.75 (GAUZE/BANDAGES/DRESSINGS) ×1 IMPLANT
ELECT REM PT RETURN 9FT ADLT (ELECTROSURGICAL) ×2
ELECTRODE REM PT RTRN 9FT ADLT (ELECTROSURGICAL) ×1 IMPLANT
GLOVE BIOGEL PI IND STRL 8 (GLOVE) ×1 IMPLANT
GLOVE BIOGEL PI INDICATOR 8 (GLOVE) ×1
GLOVE ECLIPSE 7.5 STRL STRAW (GLOVE) ×2 IMPLANT
GOWN STRL NON-REIN LRG LVL3 (GOWN DISPOSABLE) ×4 IMPLANT
KIT BASIN OR (CUSTOM PROCEDURE TRAY) ×2 IMPLANT
KIT ROOM TURNOVER OR (KITS) ×2 IMPLANT
NS IRRIG 1000ML POUR BTL (IV SOLUTION) ×2 IMPLANT
PAD ARMBOARD 7.5X6 YLW CONV (MISCELLANEOUS) ×4 IMPLANT
POUCH SPECIMEN RETRIEVAL 10MM (ENDOMECHANICALS) IMPLANT
SCISSORS LAP 5X35 DISP (ENDOMECHANICALS) IMPLANT
SET CHOLANGIOGRAPH 5 50 .035 (SET/KITS/TRAYS/PACK) ×2 IMPLANT
SET IRRIG TUBING LAPAROSCOPIC (IRRIGATION / IRRIGATOR) ×2 IMPLANT
SLEEVE ENDOPATH XCEL 5M (ENDOMECHANICALS) ×4 IMPLANT
SPECIMEN JAR SMALL (MISCELLANEOUS) ×2 IMPLANT
SUT MNCRL AB 4-0 PS2 18 (SUTURE) ×2 IMPLANT
TOWEL OR 17X24 6PK STRL BLUE (TOWEL DISPOSABLE) ×2 IMPLANT
TOWEL OR 17X26 10 PK STRL BLUE (TOWEL DISPOSABLE) ×2 IMPLANT
TRAY LAPAROSCOPIC (CUSTOM PROCEDURE TRAY) ×2 IMPLANT
TROCAR XCEL BLUNT TIP 100MML (ENDOMECHANICALS) ×2 IMPLANT
TROCAR XCEL NON-BLD 11X100MML (ENDOMECHANICALS) IMPLANT
TROCAR XCEL NON-BLD 5MMX100MML (ENDOMECHANICALS) ×2 IMPLANT
WATER STERILE IRR 1000ML POUR (IV SOLUTION) IMPLANT

## 2012-05-03 NOTE — Preoperative (Signed)
Beta Blockers   Reason not to administer Beta Blockers:Not Applicable 

## 2012-05-03 NOTE — Transfer of Care (Signed)
Immediate Anesthesia Transfer of Care Note  Patient: Vincent Allen  Procedure(s) Performed: Procedure(s): LAPAROSCOPIC CHOLECYSTECTOMY WITH INTRAOPERATIVE CHOLANGIOGRAM (N/A)  Patient Location: PACU  Anesthesia Type:General  Level of Consciousness: awake, alert  and oriented  Airway & Oxygen Therapy: Patient Spontanous Breathing and Patient connected to nasal cannula oxygen  Post-op Assessment: Report given to PACU RN and Post -op Vital signs reviewed and stable  Post vital signs: Reviewed and stable  Complications: No apparent anesthesia complications

## 2012-05-03 NOTE — Op Note (Signed)
OPERATIVE REPORT  DATE OF OPERATION: 05/02/2012 - 05/03/2012  PATIENT:  Vincent Allen  79 y.o. male  PRE-OPERATIVE DIAGNOSIS:  cholelithiasis and acute cholecystitis  POST-OPERATIVE DIAGNOSIS:  cholelithiasis and acute cholecystitis  PROCEDURE:  Procedure(s): LAPAROSCOPIC CHOLECYSTECTOMY WITH INTRAOPERATIVE CHOLANGIOGRAM  SURGEON:  Surgeon(s): [name], MD  ASSISTANT: None  ANESTHESIA:   general  EBL: <50 ml  BLOOD ADMINISTERED: none  DRAINS: none   SPECIMEN:  Source of Specimen:  Gallbladder and stones  COUNTS CORRECT:  YES  PROCEDURE DETAILS: The patient was taken to the operating room and placed on the table in the supine position.  After an adequate endotracheal anesthetic was administered, (she/he) was prepped with ChloroPrep, and then draped in the usual manner exposing the entire abdomen laterally, inferiorly and up  to the costal margins.  After a proper timeout was performed including identifying the patient and the procedure to be performed, a supra-umbilical1.5cm midline incision was made using a #15 blade.  This was taken down to the fascia which was then incised with a #15 blade.  The edges of the fascia were tented up with Kocher clamps as the preperitoneal space was penetrated with a Kelly clamp into the peritoneum.  Once this was done, a pursestring suture of 0 Vicryl was passed around the fascial opening.  This was subsequently used to secure the [facility] cannula which was passed into the peritoneal cavity.  Once the [facility] cannula was in place, carbon dioxide gas was insufflated into the peritoneal cavity up to a maximal intra-abdominal pressure of 81mm Hg.The laparoscope, with attached camera and light source, was passed into the peritoneal cavity to visualize the direct insertion of two right upper quadrant 67mm cannulas, and a sup-xiphoid 10-44mm cannula.  Once all cannulas were in place, the dissection was begun.  The gallbladder was tense from  inflammation and was decompressed using the aspirator.  Two ratcheted graspers were attached to the dome and infundibulum of the gallbladder and retracted towards the anterior abdominal wall and the right upper quadrant.  Using cautery attached to a dissecting forceps, the peritoneum overlaying the triangle of Chalot and the hepatoduodenal triangle was dissected away exposing the cystic duct and the cystic artery.  A clip was placed on the gallbladder side of the cystic duct, then a cholecytodochotomy made using the laparoscopic scissors.  Through the cholecystodochotomy a Cook catheter was passed to performed a cholangiogram.  The cholangiogram showed a very small in caliber ductal system, good flow into the duodenum, no intraductal filling defects, and good proximal flow.  .  Once the cholangiogram was completed, the [facility] catheter was removed, and the distal cystic duct was clipped multiple times then transected.  The gallbladder was then dissected out of the hepatic bed without event.  It was retrieved from the abdomen(using an EndoCatch bag without event.  Once the gallbladder was removed, the bed was inspected for hemostasis.  Once excellent hemostasis was obtained all gas and fluids were aspirated from above the liver, then the cannulas were removed.  The Supra-umbilical incision was closed using the pursestring suture which was in place.  0.25% bupivicaine with epinephrine was injected at all sites.  All 36mm or greater cannula sites were close using a running subcuticular stitch of 4-0 Monocryl.  5.65mm cannula sites were closed with Dermabond only.Steri-Strips and Tagaderm were used to complete the dressings at all sites.  At this point all needle, sponge, and instrument counts were correct.The patient was awakened from anesthesia and taken to  the PACU in stable condition.  PATIENT DISPOSITION:  PACU - hemodynamically stable.   [name] 2/16/20141:29 PM

## 2012-05-03 NOTE — Anesthesia Postprocedure Evaluation (Signed)
Anesthesia Post Note  Patient: Vincent Allen  Procedure(s) Performed: Procedure(s) (LRB): LAPAROSCOPIC CHOLECYSTECTOMY WITH INTRAOPERATIVE CHOLANGIOGRAM (N/A)  Anesthesia type: General  Patient location: PACU  Post pain: Pain level controlled and Adequate analgesia  Post assessment: Post-op Vital signs reviewed, Patient's Cardiovascular Status Stable, Respiratory Function Stable, Patent Airway and Pain level controlled  Last Vitals:  Filed Vitals:   05/03/12 1415  BP: 99/50  Pulse:   Temp:   Resp:     Post vital signs: Reviewed and stable  Level of consciousness: awake, alert  and oriented  Complications: No apparent anesthesia complications

## 2012-05-03 NOTE — Anesthesia Preprocedure Evaluation (Signed)
Anesthesia Evaluation  Patient identified by MRN, date of birth, ID band Patient awake    Reviewed: Allergy & Precautions, H&P , NPO status , Patient's Chart, lab work & pertinent test results  Airway Mallampati: II  Neck ROM: full    Dental   Pulmonary Recent URI ,          Cardiovascular hypertension, + Peripheral Vascular Disease     Neuro/Psych    GI/Hepatic GERD-  ,  Endo/Other  diabetes, Type 2  Renal/GU Renal InsufficiencyRenal disease     Musculoskeletal   Abdominal   Peds  Hematology   Anesthesia Other Findings   Reproductive/Obstetrics                           Anesthesia Physical Anesthesia Plan  ASA: III  Anesthesia Plan: General   Post-op Pain Management:    Induction: Intravenous  Airway Management Planned: Oral ETT  Additional Equipment:   Intra-op Plan:   Post-operative Plan: Extubation in OR  Informed Consent: I have reviewed the patients History and Physical, chart, labs and discussed the procedure including the risks, benefits and alternatives for the proposed anesthesia with the patient or authorized representative who has indicated his/her understanding and acceptance.     Plan Discussed with: CRNA and Surgeon  Anesthesia Plan Comments:         Anesthesia Quick Evaluation

## 2012-05-03 NOTE — Consult Note (Signed)
Cardiology Consult Note  Admit date: 05/02/2012 Name: Vincent Allen 79 y.o.  male DOB:  Sep 30, 1933 MRN:  [id number]  Today's date:  05/03/2012  Referring Physician:  Dr. Coralie Keens  Primary Physician:    Dr. Daniel Nones  Reason for Consultation:    Bradycardia and relative hypotension postoperatively  IMPRESSIONS: 1. Probable junctional rhythm with borderline blood pressure following surgery. He is asymptomatic and appears well perfused at this time. There are no acute changes on EKG. He was on chronic verapamil and this should be discontinued. Since he did not have a preoperative EKG it is unclear whether this was present preoperatively or not. The anesthesiologist did not recall whether he had P waves noted during surgery. 2. Stage III chronic kidney disease 3. Hypertension 4. 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 Recent cholecystectomy  RECOMMENDATION: He currently appears clinically stable. I think he can go back to the floor but needs to be monitored on telemetry with frequent vital signs. I would discontinue verapamil at this time. We'll followup telemetry as well as EKG in the morning. Check serial troponins but doubt this is an ischemic event. I would check an echocardiogram to evaluate his left ventricular function.  HISTORY: This 79 year-old male was admitted to the hospital with symptoms of acute cholecystitis. He was feeling well prior to this. He has a history of diabetes as well as hypertension but has never had coronary artery disease. He does not recall seeing a cardiologist or having a stress test previously. He currently lives alone and denies angina. He denies shortness of breath, difficulty walking, syncope or dizziness.  He underwent laparoscopic cholecystectomy and in recovery was noted to have a blood pressure around 100 and a probable junctional rhythm. I spoke anesthesiologist a reported that the surgery was uneventful and he did not  recall whether there were P waves seen perioperatively or not. There was no preoperative EKG noted. I reviewed an EKG from 2009 that showed sinus rhythm and a left bundle branch block.  Currently the patient is awake and is asymptomatic. He feels fine and denies angina, pain, PND, or orthopnea. He has been treated with verapamil for hypertension previously.  Past Medical History  Diagnosis Date  . Cataract   . GERD (gastroesophageal reflux disease)   . Diabetes mellitus   . Hypertension   . Hyperlipidemia   . Colon polyps   . Chronic kidney disease stage III (GFR 30-59 ml/min) 05/03/2012      Past Surgical History  Procedure Laterality Date  . Cataract extraction    . Rotator cuff repair    . Ankle arthroplasty    . Tonsillectomy       Allergies:  is allergic to penicillins.   Medications: Prior to Admission medications   Medication Sig Start Date End Date Taking? Authorizing Provider  aspirin EC 81 MG tablet Take 81 mg by mouth daily.   Yes Historical Provider, MD  atorvastatin (LIPITOR) 20 MG tablet TAKE 1 TABLET BY MOUTH DAILY 12/30/11  Yes Burnice Logan, MD  Calcium 600 MG tablet Take 1 tablet (600 mg total) by mouth 2 (two) times daily. 01/30/11  Yes Burnice Logan, MD  cholecalciferol (GNP VITAMIN D) 400 UNITS TABS Take 1 tablet (400 Units total) by mouth 2 (two) times daily. 01/30/11  Yes Burnice Logan, MD  glipiZIDE (GLUCOTROL) 10 MG tablet TAKE 2 TABLETS BY MOUTH TWICE A DAY BEFORE MEALS 01/23/12  Yes Burnice Logan, MD  lisinopril (PRINIVIL,ZESTRIL)  5 MG tablet TAKE 1 TABLET BY MOUTH EVERY DAY 12/18/11  Yes Burnice Logan, MD  Naproxen Sodium (ALEVE) 220 MG CAPS Take 2 capsules by mouth 2 (two) times daily as needed (pain).   Yes Historical Provider, MD  sitaGLIPtin (JANUVIA) 50 MG tablet Take 1 tablet (50 mg total) by mouth daily. 09/25/11 09/24/12 Yes Burnice Logan, MD  triamterene-hydrochlorothiazide (MAXZIDE) 75-50 MG per tablet TAKE 1 TABLET BY MOUTH EVERY DAY  02/01/12  Yes Burnice Logan, MD  verapamil (CALAN-SR) 240 MG CR tablet TAKE 1 TABLET BY MOUTH AT BEDTIME 04/01/12  Yes Burnice Logan, MD  glucose blood test strip OneTouch Ultra. Use as instructed to check blood sugar twice a day as needed  Dx Code 250.00 03/04/12   Burnice Logan, MD  Lancets [facility] ULTRASOFT) lancets Use as instructed to check blood sugar twice a day as needed Dx Code 250.00 06/28/11   Burnice Logan, MD    Family History: No family status information on file.    Social History:   reports that he has never smoked. He has never used smokeless tobacco. He reports that he does not drink alcohol or use illicit drugs.   History   Social History Narrative  . No narrative on file    Review of Systems: Denied shortness of breath or chest pain prior to surgery. Currently no complaints of shortness breath or chest pain and feels fine. No history of syncope. Other than as noted above the remainder of the review of systems is unremarkable.  Physical Exam: BP 94/71  Pulse 76  Temp(Src) 98.7 F (37.1 C) (Oral)  Resp 15  Ht 6' (1.829 m)  Wt 106 kg (233 lb 11 oz)  BMI 31.69 kg/m2  SpO2 92%  General appearance: alert, cooperative, appears stated age and no distress Head: Normocephalic, without obvious abnormality, atraumatic Neck: no adenopathy, no carotid bruit, no JVD and supple, symmetrical, trachea midline Lungs: clear to auscultation bilaterally Heart: regular rate and rhythm, S1, S2 normal, no murmur, click, rub or gallop Abdomen: Not examined Rectal: deferred Extremities: extremities normal, atraumatic, no cyanosis or edema Pulses: 2+ and symmetric Skin: Skin color, texture, turgor normal. No rashes or lesions Neurologic: Grossly normal  Labs: CBC  Recent Labs  05/02/12 1005  05/03/12 1500  WBC 18.0*  < > 19.6*  RBC 5.31  < > 4.86  HGB 14.9  < > 13.5  HCT 43.9  < > 40.5  PLT 169  < > 176  MCV 82.7  < > 83.3  MCH 28.1  < > 27.8  MCHC 33.9  < >  33.3  RDW 13.1  < > 13.6  LYMPHSABS 1.4  --   --   MONOABS 2.1*  --   --   EOSABS 0.0  --   --   BASOSABS 0.1  --   --   < > = values in this interval not displayed. CMP   Recent Labs  05/03/12 0600  NA 134*  K 4.4  CL 101  CO2 19  GLUCOSE 279*  BUN 22  CREATININE 1.64*  CALCIUM 8.1*  PROT 6.1  ALBUMIN 2.4*  AST 9  ALT 10  ALKPHOS 63  BILITOT 0.9  GFRNONAA 39*  GFRAA 45*   Cardiac Panel (last 3 results)  Recent Labs  05/03/12 1439  TROPONINI <0.30     Radiology: Upper limit of normal heart size  EKG: P waves difficult to see, probable junctional rhythm with a  left bundle branch block pattern. Clear P waves were seen on EKG in 2009.  Signed:  Kerry Hough MD [facility]   Cardiology Consultant  05/03/2012, 4:00 PM

## 2012-05-03 NOTE — Interval H&P Note (Signed)
History and Physical Interval Note:  05/03/2012 7:47 AM  Vincent Allen  has presented today for surgery, with the diagnosis of cholelithiasis and acute cholecystitis  The various methods of treatment have been discussed with the patient and family. After consideration of risks, benefits and other options for treatment, the patient has consented to  Procedure(s): LAPAROSCOPIC CHOLECYSTECTOMY WITH INTRAOPERATIVE CHOLANGIOGRAM (N/A) as a surgical intervention .  The patient's history has been reviewed, patient examined, no change in status, stable for surgery.  I have reviewed the patient's chart and labs.  Questions were answered to the patient's satisfaction.  I have seen the patient and he continues to have pain and tenderness.  Should be able to perform laparoscopic cholecystectomy today.   Jianna Drabik, Kathryne Eriksson

## 2012-05-04 DIAGNOSIS — E119 Type 2 diabetes mellitus without complications: Secondary | ICD-10-CM

## 2012-05-04 DIAGNOSIS — K810 Acute cholecystitis: Secondary | ICD-10-CM

## 2012-05-04 LAB — CBC WITH DIFFERENTIAL/PLATELET
Eosinophils Absolute: 0.1 10*3/uL (ref 0.0–0.7)
HCT: 38.2 % — ABNORMAL LOW (ref 39.0–52.0)
Hemoglobin: 12.4 g/dL — ABNORMAL LOW (ref 13.0–17.0)
Lymphs Abs: 0.9 10*3/uL (ref 0.7–4.0)
MCH: 27 pg (ref 26.0–34.0)
MCV: 83 fL (ref 78.0–100.0)
Monocytes Absolute: 1.5 10*3/uL — ABNORMAL HIGH (ref 0.1–1.0)
Monocytes Relative: 12 % (ref 3–12)
Neutrophils Relative %: 80 % — ABNORMAL HIGH (ref 43–77)
RBC: 4.6 MIL/uL (ref 4.22–5.81)

## 2012-05-04 LAB — COMPREHENSIVE METABOLIC PANEL
Alkaline Phosphatase: 62 U/L (ref 39–117)
BUN: 32 mg/dL — ABNORMAL HIGH (ref 6–23)
GFR calc Af Amer: 36 mL/min — ABNORMAL LOW (ref >90)
Glucose, Bld: 286 mg/dL — ABNORMAL HIGH (ref 70–99)
Potassium: 4.5 mEq/L (ref 3.5–5.1)
Total Bilirubin: 0.8 mg/dL (ref 0.3–1.2)
Total Protein: 6 g/dL (ref 6.0–8.3)

## 2012-05-04 LAB — GLUCOSE, CAPILLARY
Glucose-Capillary: 235 mg/dL — ABNORMAL HIGH (ref 70–99)
Glucose-Capillary: 157 mg/dL — ABNORMAL HIGH (ref 70–99)
Glucose-Capillary: 175 mg/dL — ABNORMAL HIGH (ref 70–99)

## 2012-05-04 MED ORDER — SODIUM CHLORIDE 0.9 % IV SOLN
INTRAVENOUS | Status: DC
  Administered 2012-05-04 (×2): via INTRAVENOUS
  Filled 2012-05-04 (×4): qty 1000

## 2012-05-04 MED ORDER — LINAGLIPTIN 5 MG PO TABS
5.0000 mg | ORAL_TABLET | Freq: Every day | ORAL | Status: DC
  Administered 2012-05-04 – 2012-05-05 (×2): 5 mg via ORAL
  Filled 2012-05-04 (×2): qty 1

## 2012-05-04 MED ORDER — GLIPIZIDE 10 MG PO TABS
10.0000 mg | ORAL_TABLET | Freq: Two times a day (BID) | ORAL | Status: DC
  Administered 2012-05-04 – 2012-05-05 (×2): 10 mg via ORAL
  Filled 2012-05-04 (×4): qty 1

## 2012-05-04 MED ORDER — SODIUM CHLORIDE 0.9 % IV SOLN
Freq: Once | INTRAVENOUS | Status: AC
  Administered 2012-05-04: via INTRAVENOUS

## 2012-05-04 MED ORDER — SODIUM CHLORIDE 0.9 % IV SOLN: INTRAVENOUS | Status: DC

## 2012-05-04 MED ORDER — PANTOPRAZOLE SODIUM 40 MG PO TBEC
40.0000 mg | DELAYED_RELEASE_TABLET | Freq: Every day | ORAL | Status: DC
  Administered 2012-05-04 – 2012-05-05 (×2): 40 mg via ORAL
  Filled 2012-05-04: qty 1

## 2012-05-04 NOTE — Progress Notes (Signed)
Patient ID: Vincent Allen, male   DOB: 1933-08-20, 79 y.o.   MRN: [id number] 1 Day Post-Op  Subjective: Pt feels ok this morning.  No pain, just sore.  Eating some food.  Just started urinating at 0300am this morning.  Has voided 300cc since then.  Objective: Vital signs in last 24 hours: Temp:  [97.2 F (36.2 C)-99.6 F (37.6 C)] 99.6 F (37.6 C) (02/17 0640) Pulse Rate:  [61-76] 71 (02/17 0640) Resp:  [15-20] 18 (02/17 0640) BP: (90-137)/(34-71) 137/54 mmHg (02/17 0640) SpO2:  [92 %-100 %] 100 % (02/17 0640) Last BM Date: 05/01/12  Intake/Output from previous day: 02/16 0701 - 02/17 0700 In: 4076.8 [P.O.:1430; I.V.:2646.8] Out: 234 [Urine:127] Intake/Output this shift:    PE: Abd: soft, minimal tenderness, +BS, ND, incisions c/d/i Heart: regular Lungs: CTAB  Lab Results:   Recent Labs  05/03/12 1500 05/04/12 0900  WBC 19.6* 12.4*  HGB 13.5 12.4*  HCT 40.5 38.2*  PLT 176 176   BMET  Recent Labs  05/02/12 1005 05/03/12 0600  NA 134* 134*  K 4.3 4.4  CL 99 101  CO2 23 19  GLUCOSE 270* 279*  BUN 22 22  CREATININE 1.55* 1.64*  CALCIUM 9.0 8.1*   PT/INR No results found for this basename: LABPROT, INR,  in the last 72 hours CMP     Component Value Date/Time   NA 134* 05/03/2012 0600   K 4.4 05/03/2012 0600   CL 101 05/03/2012 0600   CO2 19 05/03/2012 0600   GLUCOSE 279* 05/03/2012 0600   BUN 22 05/03/2012 0600   CREATININE 1.64* 05/03/2012 0600   CREATININE 1.40* 03/23/2012 0921   CALCIUM 8.1* 05/03/2012 0600   PROT 6.1 05/03/2012 0600   ALBUMIN 2.4* 05/03/2012 0600   AST 9 05/03/2012 0600   ALT 10 05/03/2012 0600   ALKPHOS 63 05/03/2012 0600   BILITOT 0.9 05/03/2012 0600   GFRNONAA 39* 05/03/2012 0600   GFRAA 45* 05/03/2012 0600   Lipase     Component Value Date/Time   LIPASE 38 05/02/2012 1005       Studies/Results: Ct Abdomen Pelvis Wo Contrast  05/02/2012  *RADIOLOGY REPORT*  Clinical Data: Right flank pain  CT ABDOMEN AND PELVIS WITHOUT  CONTRAST  Technique:  Multidetector CT imaging of the abdomen and pelvis was performed following the standard protocol without intravenous contrast.  Comparison: None.  Findings: Normal gas filled appendix.  Kidneys are atrophic bilaterally without hydronephrosis.  No definite ureteral calculi.  Multiple phleboliths project over the pelvis.  Bibasilar atelectasis.  Unenhanced liver, gallbladder, spleen, pancreas, adrenal glands within normal limits.  Sigmoid diverticulosis without evidence of acute diverticulitis.  No free fluid.  Negative for abnormal adenopathy.  Bladder and prostate are within normal limits.  Advanced degenerative changes in the lumbar spine with multilevel spinal stenosis.  No vertebral compression deformity.  IMPRESSION: No evidence of ureteral obstruction.  Normal appendix.  Advanced degenerative changes in the lumbar spine.   Original Report Authenticated By: Marybelle Killings, M.D.    Dg Chest 2 View  05/03/2012  *RADIOLOGY REPORT*  Clinical Data: Preop  CHEST - 2 VIEW  Comparison: 09/05/2004  Findings: Heart is upper normal in size.  Vascular congestion.  Low volumes.  Bibasilar atelectasis.  No pneumothorax or pleural effusion.  IMPRESSION: Vascular congestion and bronchitic changes.   Original Report Authenticated By: Marybelle Killings, M.D.    Dg Cholangiogram Operative  05/03/2012  *RADIOLOGY REPORT*  Clinical Data:   Intraoperative cholangiogram  INTRAOPERATIVE CHOLANGIOGRAM  Technique:  Cholangiographic images from the C-arm fluoroscopic device were submitted for interpretation post-operatively.  Please see the procedural report for the amount of contrast and the fluoroscopy time utilized.  Comparison:  None.  Findings:  Contrast fills the biliary tree and duodenum compatible with patency.  There are no filling defects in the common bile duct to suggest common duct stones.  IMPRESSION: Patent biliary tree without common bile duct stones.   Original Report Authenticated By: Marybelle Killings, M.D.     US Abdomen Complete  05/02/2012  *RADIOLOGY REPORT*  Clinical Data:  Right upper quadrant pain.  Rule out gallstones, cholecystitis.  COMPLETE ABDOMINAL ULTRASOUND  Comparison:  CT 05/02/2012  Findings:  Gallbladder:  Sludge noted within the gallbladder.  Small echogenic stones, the largest 6 mm.  Gallbladder wall is thickened at 9 mm. Negative sonographic Murphy's.  Common bile duct:   Normal caliber, 3 mm.  Liver:  No focal lesion identified.  Within normal limits in parenchymal echogenicity.  IVC:  Not visualized due to overlying bowel gas.  Pancreas:  Not visualized due to overlying bowel gas.  Spleen:  Within normal limits in size and echotexture.  Right Kidney:   Mild cortical thinning.  No hydronephrosis or focal abnormality.  Left Kidney:  Mild cortical thinning and increased echotexture.  No focal abnormality or hydronephrosis.  Abdominal aorta:  Visualized portions non-aneurysmal.  IMPRESSION: Gallstones and sludge within the gallbladder.  Gallbladder wall thickening without sonographic Murphy's sign.  Findings suggestive of chronic cholecystitis.   Original Report Authenticated By: Rolm Baptise, M.D.     Anti-infectives: Anti-infectives   Start     Dose/Rate Route Frequency Ordered Stop   05/03/12 0600  ciprofloxacin (CIPRO) IVPB 400 mg     400 mg 200 mL/hr over 60 Minutes Intravenous Every 12 hours 05/02/12 1901     05/02/12 1800  ciprofloxacin (CIPRO) IVPB 400 mg  Status:  Discontinued     400 mg 200 mL/hr over 60 Minutes Intravenous  Once 05/02/12 1756 05/03/12 1648   05/02/12 1800  metroNIDAZOLE (FLAGYL) IVPB 500 mg     500 mg 100 mL/hr over 60 Minutes Intravenous  Once 05/02/12 1756 05/02/12 1931       Assessment/Plan  1. S/p lap chole 2. Post op junctional rhythm 3. Decrease UOP 4. DM 5. HTN  Plan: 1. Appreciate Dr. Thurman Coyer assistance with this patient.  Await echo. 2. Resume home DM meds to help control cbgs better 3. Cont IVFs and monitor UOP closely. 4.  Awaiting BMET from today.  Recheck in the morning 5. Cont diet as able to eat.   LOS: 2 days    Glendell Schlottman E 05/04/2012, 9:53 AM Pager: [phone]

## 2012-05-04 NOTE — Progress Notes (Signed)
Subjective:  C/o abdominal soreness but otherwise OK.  Not SOB, No chest pain.  Junctional rhythm resolved post op. Unclear if it was present preop or not as no EKG sone.  Objective:  Vital Signs in the last 24 hours: BP 137/54  Pulse 71  Temp(Src) 99.6 F (37.6 C) (Oral)  Resp 18  Ht 6' (1.829 m)  Wt 106 kg (233 lb 11 oz)  BMI 31.69 kg/m2  SpO2 100%  Physical Exam: Pleasant WM in NAD Lungs:  Clear Cardiac:  Regular rhythm, normal S1 and S2, no S3 Extremities:  No edema present  Intake/Output from previous day: 02/16 0701 - 02/17 0700 In: 4076.8 [P.O.:1430; I.V.:2646.8] Out: 234 [Urine:127]  Weight Filed Weights   05/02/12 2000  Weight: 106 kg (233 lb 11 oz)    Lab Results: Basic Metabolic Panel:  Recent Labs  05/02/12 1005 05/03/12 0600  NA 134* 134*  K 4.3 4.4  CL 99 101  CO2 23 19  GLUCOSE 270* 279*  BUN 22 22  CREATININE 1.55* 1.64*   CBC:  Recent Labs  05/02/12 1005  05/03/12 1500 05/04/12 0900  WBC 18.0*  < > 19.6* 12.4*  NEUTROABS 14.5*  --   --  9.9*  HGB 14.9  < > 13.5 12.4*  HCT 43.9  < > 40.5 38.2*  MCV 82.7  < > 83.3 83.0  PLT 169  < > 176 176  < > = values in this interval not displayed. Cardiac Enzymes:  Recent Labs  05/03/12 1439 05/03/12 2020 05/04/12 0232  TROPONINI <0.30 <0.30 <0.30    Telemetry: In NSR now  EKG shows LBBB and sinus rhythm  Assessment/Plan:  1. Postop junctional rhythm that has resolved.  Unclear if it was present preop as no EKG was done.  It could have been related to Verapamil.  2. LBBB 3. Stage 3 CKD  Rec:  I would stop verapamil and watch BP.  IF BP goes up I would change to amlodipine for BP control.  Await ECHO.      Kerry Hough  MD [facility]  05/04/2012, 9:47 AM

## 2012-05-04 NOTE — Progress Notes (Signed)
Inpatient Diabetes Program Recommendations  AACE/ADA: New Consensus Statement on Inpatient Glycemic Control (2013)  Target Ranges:  Prepandial:   less than 140 mg/dL      Peak postprandial:   less than 180 mg/dL (1-2 hours)      Critically ill patients:  140 - 180 mg/dL   Inpatient Diabetes Program Recommendations Insulin - Basal: Add Lantus 10 units if CBGs remain >200 Correction (SSI): Increase correction scale to MODERATE  Thank you  Raoul Pitch BSN, RN,CDE Inpatient Diabetes Coordinator [phone] (team pager)

## 2012-05-04 NOTE — Progress Notes (Signed)
I have seen and examined the patient and agree with the assessment and plans.  Sabien Umland A. Nalin Mazzocco  MD, FACS  

## 2012-05-04 NOTE — Progress Notes (Signed)
Pt does not have urine output since 12 noon 05/03/12.He denies urge to urinate.Bladder scan done and it shows 176ml urine volume. Dr Hulen Skains notified, ordered to give 500cc NS bolus. Will carry out and continue to monitor pt.

## 2012-05-05 ENCOUNTER — Telehealth (INDEPENDENT_AMBULATORY_CARE_PROVIDER_SITE_OTHER): Payer: Self-pay | Admitting: General Surgery

## 2012-05-05 ENCOUNTER — Telehealth: Payer: Self-pay | Admitting: Internal Medicine

## 2012-05-05 ENCOUNTER — Emergency Department (HOSPITAL_COMMUNITY)
Admission: EM | Admit: 2012-05-05 | Discharge: 2012-05-05 | Disposition: A | Discharge status: Home / Self Care | Payer: Medicare Other | Attending: Emergency Medicine | Admitting: Emergency Medicine

## 2012-05-05 ENCOUNTER — Encounter (HOSPITAL_COMMUNITY): Payer: Self-pay | Admitting: *Deleted

## 2012-05-05 DIAGNOSIS — Z79899 Other long term (current) drug therapy: Secondary | ICD-10-CM | POA: Insufficient documentation

## 2012-05-05 DIAGNOSIS — Z8679 Personal history of other diseases of the circulatory system: Secondary | ICD-10-CM | POA: Insufficient documentation

## 2012-05-05 DIAGNOSIS — Z8719 Personal history of other diseases of the digestive system: Secondary | ICD-10-CM | POA: Insufficient documentation

## 2012-05-05 DIAGNOSIS — E785 Hyperlipidemia, unspecified: Secondary | ICD-10-CM | POA: Insufficient documentation

## 2012-05-05 DIAGNOSIS — M7120 Synovial cyst of popliteal space [Baker], unspecified knee: Secondary | ICD-10-CM | POA: Insufficient documentation

## 2012-05-05 DIAGNOSIS — M7122 Synovial cyst of popliteal space [Baker], left knee: Secondary | ICD-10-CM

## 2012-05-05 DIAGNOSIS — I129 Hypertensive chronic kidney disease with stage 1 through stage 4 chronic kidney disease, or unspecified chronic kidney disease: Secondary | ICD-10-CM | POA: Insufficient documentation

## 2012-05-05 DIAGNOSIS — Z9089 Acquired absence of other organs: Secondary | ICD-10-CM | POA: Insufficient documentation

## 2012-05-05 DIAGNOSIS — Z9849 Cataract extraction status, unspecified eye: Secondary | ICD-10-CM | POA: Insufficient documentation

## 2012-05-05 DIAGNOSIS — N183 Chronic kidney disease, stage 3 unspecified: Secondary | ICD-10-CM | POA: Insufficient documentation

## 2012-05-05 DIAGNOSIS — M7989 Other specified soft tissue disorders: Secondary | ICD-10-CM

## 2012-05-05 DIAGNOSIS — M79609 Pain in unspecified limb: Secondary | ICD-10-CM

## 2012-05-05 DIAGNOSIS — Z860100 Personal history of colon polyps, unspecified: Secondary | ICD-10-CM | POA: Insufficient documentation

## 2012-05-05 DIAGNOSIS — E119 Type 2 diabetes mellitus without complications: Secondary | ICD-10-CM | POA: Insufficient documentation

## 2012-05-05 DIAGNOSIS — Z7982 Long term (current) use of aspirin: Secondary | ICD-10-CM | POA: Insufficient documentation

## 2012-05-05 LAB — BASIC METABOLIC PANEL
BUN: 28 mg/dL — ABNORMAL HIGH (ref 6–23)
Chloride: 106 mEq/L (ref 96–112)
GFR calc Af Amer: 42 mL/min — ABNORMAL LOW (ref >90)
Potassium: 4.4 mEq/L (ref 3.5–5.1)

## 2012-05-05 LAB — CBC WITH DIFFERENTIAL/PLATELET
Basophils Absolute: 0 10*3/uL (ref 0.0–0.1)
Eosinophils Relative: 2 % (ref 0–5)
Lymphocytes Relative: 9 % — ABNORMAL LOW (ref 12–46)
MCV: 83.2 fL (ref 78.0–100.0)
Neutro Abs: 7.9 10*3/uL — ABNORMAL HIGH (ref 1.7–7.7)
Neutrophils Relative %: 78 % — ABNORMAL HIGH (ref 43–77)
Platelets: 210 10*3/uL (ref 150–400)
RBC: 4.7 MIL/uL (ref 4.22–5.81)
RDW: 13.4 % (ref 11.5–15.5)
WBC: 10.1 10*3/uL (ref 4.0–10.5)

## 2012-05-05 LAB — COMPREHENSIVE METABOLIC PANEL
ALT: 41 U/L (ref 0–53)
AST: 40 U/L — ABNORMAL HIGH (ref 0–37)
Alkaline Phosphatase: 66 U/L (ref 39–117)
CO2: 21 mEq/L (ref 19–32)
Calcium: 8.5 mg/dL (ref 8.4–10.5)
GFR calc non Af Amer: 41 mL/min — ABNORMAL LOW (ref >90)
Potassium: 4.4 mEq/L (ref 3.5–5.1)
Sodium: 133 mEq/L — ABNORMAL LOW (ref 135–145)

## 2012-05-05 LAB — URINALYSIS, ROUTINE W REFLEX MICROSCOPIC
Bilirubin Urine: NEGATIVE
Glucose, UA: NEGATIVE mg/dL
Ketones, ur: NEGATIVE mg/dL
pH: 5 (ref 5.0–8.0)

## 2012-05-05 MED ORDER — AMLODIPINE BESYLATE 5 MG PO TABS
5.0000 mg | ORAL_TABLET | Freq: Every day | ORAL | Status: DC
  Administered 2012-05-05: 5 mg via ORAL
  Filled 2012-05-05: qty 1

## 2012-05-05 MED ORDER — HYDROCODONE-ACETAMINOPHEN 5-325 MG PO TABS: 1.0000 | ORAL_TABLET | ORAL | Status: DC | PRN

## 2012-05-05 MED ORDER — AMLODIPINE BESYLATE 5 MG PO TABS: 5.0000 mg | ORAL_TABLET | Freq: Every day | ORAL | Status: DC

## 2012-05-05 MED ORDER — HYDROCODONE-ACETAMINOPHEN 5-325 MG PO TABS: 1.0000 | ORAL_TABLET | Freq: Four times a day (QID) | ORAL | Status: DC | PRN

## 2012-05-05 NOTE — ED Notes (Signed)
Pt was just discharged from [facility] this noon after having his gallbladder removed on Sunday.  Around 1500 pt noticed his L calf swelling and felt pain behind his L knee.  BIL LE swelling, L greater than R.  Pedal pulse strong to L foot.

## 2012-05-05 NOTE — Progress Notes (Signed)
VASCULAR LAB PRELIMINARY  PRELIMINARY  PRELIMINARY  PRELIMINARY  Left lower extremity venous duplex completed.    Preliminary report:  Left:  No evidence of DVT or superficial thrombus. There is an area of mixed echoes in the popliteal fossa coursing 7.80 cm into the calf consistent with a ruptures Baker's cyst.  Mardelle Pandolfi, RVS 05/05/2012, 8:26 PM

## 2012-05-05 NOTE — ED Notes (Addendum)
Pt was recently discharged for gallbladder surgery, he arrived home and noticed his left leg was swelling and had pain behind his left knee. He called his dr and was instructed to come here to rule out a blood clot. Leg visibly swollen and warm to touch, pulses strong bilaterally, denies shortness of breath.

## 2012-05-05 NOTE — ED Notes (Signed)
Vascular contacted, states is on way to pick up pt.

## 2012-05-05 NOTE — Telephone Encounter (Signed)
Agree with ED, noted.

## 2012-05-05 NOTE — Discharge Summary (Signed)
I have seen and examined the patient and agree with the assessment and plans.  Lucee Brissett A. Marcellius Montagna  MD, FACS  

## 2012-05-05 NOTE — Discharge Summary (Signed)
Patient ID: Vincent Allen MRN: [id number] DOB/AGE: 1933-08-17 79 y.o.  Admit date: 05/02/2012 Discharge date: 05/05/2012  Procedures: laparoscopic cholecystectomy with IOC  Consults: cardiology  Reason for Admission: This is a 79 year old gentleman who presents with a 2 to three-day history of right-sided abdominal pain. He reports that his home gradually. It is now sharp and constant although it had been intermittent. It is in the right upper quadrant as well as through to the back. It is moderate to severe in intensity. He has no previous history of similar abdominal pain. Bowel movements have been normal. He has had no nausea or vomiting. He is otherwise without complaints.  Admission Diagnoses:  1. Acute cholecystitis  Hospital Course: The patient was admitted and taken to the operating room where he underwent a lap chole.  He tolerated it well except he went into a junctional rhythm postoperatively.  Dr. Wynonia Lawman was asked to see the patient.  By POD#1, he was back in normal rhythm.  His verapamil was stopped and he was changed to amlodipine.  The patient's diet was advanced.  By POD#2, the patient was doing well surgically and medically.  He was stable for dc hom.  PE: Abd: soft, appropriately tender, +BS, ND, incisions, c/d/i Heart: regular  Discharge Diagnoses:  Principal Problem:   Cholecystitis, acute Active Problems:   Diabetes mellitus with chronic kidney disease   Hypertensive heart disease   Chronic kidney disease stage III (GFR 30-59 ml/min)   Discharge Medications:   Medication List    STOP taking these medications       verapamil 240 MG CR tablet  Commonly known as:  CALAN-SR      TAKE these medications       ALEVE 220 MG Caps  Generic drug:  Naproxen Sodium  Take 2 capsules by mouth 2 (two) times daily as needed (pain).     amLODipine 5 MG tablet  Commonly known as:  NORVASC  Take 1 tablet (5 mg total) by mouth daily.     aspirin EC 81 MG tablet   Take 81 mg by mouth daily.     atorvastatin 20 MG tablet  Commonly known as:  LIPITOR  TAKE 1 TABLET BY MOUTH DAILY     Calcium 600 MG tablet  Take 1 tablet (600 mg total) by mouth 2 (two) times daily.     glipiZIDE 10 MG tablet  Commonly known as:  GLUCOTROL  TAKE 2 TABLETS BY MOUTH TWICE A DAY BEFORE MEALS     glucose blood test strip  OneTouch Ultra. Use as instructed to check blood sugar twice a day as needed  Dx Code 250.00     GNP VITAMIN D 400 UNITS Tabs  Generic drug:  cholecalciferol  Take 1 tablet (400 Units total) by mouth 2 (two) times daily.     HYDROcodone-acetaminophen 5-325 MG per tablet  Commonly known as:  NORCO/VICODIN  Take 1-2 tablets by mouth every 4 (four) hours as needed.     lisinopril 5 MG tablet  Commonly known as:  PRINIVIL,ZESTRIL  TAKE 1 TABLET BY MOUTH EVERY DAY     onetouch ultrasoft lancets  Use as instructed to check blood sugar twice a day as needed Dx Code 250.00     sitaGLIPtin 50 MG tablet  Commonly known as:  JANUVIA  Take 1 tablet (50 mg total) by mouth daily.     triamterene-hydrochlorothiazide 75-50 MG per tablet  Commonly known as:  MAXZIDE  TAKE 1 TABLET BY MOUTH  EVERY DAY        Discharge Instructions:     Follow-up Information   Follow up with Jeb Levering, Philbert Riser, MD. Schedule an appointment as soon as possible for a visit in 2 weeks.   Contact information:   [place] [address] [phone]       Follow up with [facility] On 05/26/2012. (11:15am, arrive at 10:45am)    Contact information:   [place]   [place] [address] [phone]       Signed: Henreitta Cea 05/05/2012, 8:59 AM

## 2012-05-05 NOTE — Telephone Encounter (Signed)
Pt is being evaluted in the ER, see ER note.

## 2012-05-05 NOTE — Telephone Encounter (Signed)
Patient Information:  Caller Name: [place]  Phone: [phone]  Patient: Vincent Allen, Vincent Allen  Gender: Male  DOB: [birth year]  Age: 79 Years  PCP: Celesta Aver (Adults only)  Office Follow Up:  Does the office need to follow up with this patient?: Yes  Instructions For The Office: OFFICE, RN SENT PT TO THE ED.  RN Note:  RN advised for pt to be seen at the ER [facility]).  pt states he does not want to go back; RN advised pt that he would need to be seen there so tests could be ran to see if he does have a DVT (Ex [place]).  Pt verbalized understanding  Symptoms  Reason For Call & Symptoms: pt reports he had surgery on 05/03/12 for gallbladder.  Pt was discharged from hospital today at lunchtime. Pt reports he has swelling in left foot from ankle to toes (since he has been home). Pt states he called surgeon who advised him he needs to follow up with PCP.   Reviewed Health History In EMR: Yes  Reviewed Medications In EMR: Yes  Reviewed Allergies In EMR: Yes  Reviewed Surgeries / Procedures: Yes  Date of Onset of Symptoms: 05/05/2012  Guideline(s) Used:  Leg Swelling and Edema  Disposition Per Guideline:   Go to ED Now (or to Office with PCP Approval)  Reason For Disposition Reached:   Thigh, calf, or ankle swelling in only one leg  Advice Given:  N/A

## 2012-05-05 NOTE — Progress Notes (Signed)
Patient discharged to home with instructions. 

## 2012-05-05 NOTE — ED Provider Notes (Signed)
History     CSN: [id number]  Arrival date & time 05/05/12  65   First MD Initiated Contact with Patient 05/05/12 1735      No chief complaint on file.   (Consider location/radiation/quality/duration/timing/severity/associated sxs/prior treatment) HPI Comments: Patient comes to the ER for evaluation of left leg swelling. Patient reports that he was just admitted to the hospital for gallbladder surgery. He went home this morning. After getting home and started to move around, he noticed his left lower leg and foot were swollen. He has some pain behind the left knee. He has not had any chest pain, cough, shortness of breath.   Past Medical History  Diagnosis Date  . Cataract   . GERD (gastroesophageal reflux disease)   . Diabetes mellitus   . Hypertension   . Hyperlipidemia   . Colon polyps   . Chronic kidney disease stage III (GFR 30-59 ml/min) 05/03/2012  . Irregular heartbeat     Past Surgical History  Procedure Laterality Date  . Cataract extraction    . Rotator cuff repair    . Ankle arthroplasty    . Tonsillectomy    . Cholecystectomy      Family History  Problem Relation Age of Onset  . Cancer Mother     colon, breast  . Heart disease Father   . Stroke Father   . Cancer Paternal Grandmother     colon    History  Substance Use Topics  . Smoking status: Never Smoker   . Smokeless tobacco: Never Used  . Alcohol Use: No      Review of Systems  Respiratory: Negative.   Cardiovascular: Negative.   Gastrointestinal: Negative.   Musculoskeletal:       Left lower leg and foot swelling  All other systems reviewed and are negative.    Allergies  Penicillins and Verapamil  Home Medications   Current Outpatient Rx  Name  Route  Sig  Dispense  Refill  . amLODipine (NORVASC) 5 MG tablet   Oral   Take 1 tablet (5 mg total) by mouth daily.   30 tablet   0   . aspirin EC 81 MG tablet   Oral   Take 81 mg by mouth daily.         Marland Kitchen atorvastatin  (LIPITOR) 20 MG tablet   Oral   Take 20 mg by mouth daily.         . Calcium 600 MG tablet   Oral   Take 1 tablet (600 mg total) by mouth 2 (two) times daily.   60 tablet   0   . cholecalciferol (VITAMIN D) 400 UNITS TABS   Oral   Take 400 Units by mouth 2 (two) times daily.         Marland Kitchen glipiZIDE (GLUCOTROL) 10 MG tablet   Oral   Take 20 mg by mouth 2 (two) times daily before a meal.         . HYDROcodone-acetaminophen (NORCO/VICODIN) 5-325 MG per tablet   Oral   Take 1-2 tablets by mouth every 6 (six) hours as needed for pain.         Marland Kitchen lisinopril (PRINIVIL,ZESTRIL) 5 MG tablet   Oral   Take 5 mg by mouth daily.         . Naproxen Sodium (ALEVE) 220 MG CAPS   Oral   Take 440 mg by mouth 2 (two) times daily as needed (pain).          Marland Kitchen  sitaGLIPtin (JANUVIA) 50 MG tablet   Oral   Take 1 tablet (50 mg total) by mouth daily.   30 tablet   6   . triamterene-hydrochlorothiazide (MAXZIDE) 75-50 MG per tablet   Oral   Take 1 tablet by mouth daily.         Marland Kitchen glucose blood test strip      OneTouch Ultra. Use as instructed to check blood sugar twice a day as needed  Dx Code 250.00   100 each   3   . Lancets (ONETOUCH ULTRASOFT) lancets      Use as instructed to check blood sugar twice a day as needed Dx Code 250.00   100 each   3     BP 166/70  Pulse 73  Temp(Src) 100.7 F (38.2 C) (Oral)  Resp 20  SpO2 97%  Physical Exam  Constitutional: He is oriented to person, place, and time. He appears well-developed and well-nourished. No distress.  HENT:  Head: Normocephalic and atraumatic.  Right Ear: Hearing normal.  Nose: Nose normal.  Mouth/Throat: Oropharynx is clear and moist and mucous membranes are normal.  Eyes: Conjunctivae and EOM are normal. Pupils are equal, round, and reactive to light.  Neck: Normal range of motion. Neck supple.  Cardiovascular: Normal rate, regular rhythm, S1 normal and S2 normal.  Exam reveals no gallop and no friction  rub.   No murmur heard. Pulmonary/Chest: Effort normal and breath sounds normal. No respiratory distress. He exhibits no tenderness.  Abdominal: Soft. Normal appearance and bowel sounds are normal. There is no hepatosplenomegaly. There is no tenderness. There is no rebound, no guarding, no tenderness at McBurney's point and negative Murphy's sign. No hernia.  Musculoskeletal: Normal range of motion.       Left lower leg: He exhibits swelling. He exhibits no tenderness.       Left foot: He exhibits swelling.  Neurological: He is alert and oriented to person, place, and time. He has normal strength. No cranial nerve deficit or sensory deficit. Coordination normal. GCS eye subscore is 4. GCS verbal subscore is 5. GCS motor subscore is 6.  Skin: Skin is warm, dry and intact. No rash noted. No cyanosis.  Psychiatric: He has a normal mood and affect. His speech is normal and behavior is normal. Thought content normal.    ED Course  Procedures (including critical care time)  Labs Reviewed  CBC WITH DIFFERENTIAL - Abnormal; Notable for the following:    Neutrophils Relative 78 (*)    Neutro Abs 7.9 (*)    Lymphocytes Relative 9 (*)    Monocytes Absolute 1.1 (*)    All other components within normal limits  COMPREHENSIVE METABOLIC PANEL - Abnormal; Notable for the following:    Sodium 133 (*)    Glucose, Bld 119 (*)    BUN 25 (*)    Creatinine, Ser 1.57 (*)    Albumin 2.3 (*)    AST 40 (*)    GFR calc non Af Amer 41 (*)    GFR calc Af Amer 48 (*)    All other components within normal limits  CULTURE, BLOOD (ROUTINE X 2)  CULTURE, BLOOD (ROUTINE X 2)  URINALYSIS, ROUTINE W REFLEX MICROSCOPIC   No results found.   Diagnosis: Baker's cyst    MDM  Patient refer to the ER by his surgeon for evaluation of left leg swelling in the postoperative time period after cholecystectomy. Patient did not have any venous cords, calf tenderness and Homan sign  is negative. There is, however moderate  swelling. Venous duplex was performed and does not show evidence of clot. He does show evidence of a ruptured Baker's cyst which would explain the patient's symptoms. Patient was reassured, elevate the leg and use analgesia as needed.  She did have a low-grade fever at arrival. This has resolved without any medication. I do not have a source for the fever. Workup has been negative. He has not had any pulmonary complaints. Blood culture pending. Will discharge to home.        Orpah Greek, MD 05/05/12 2032

## 2012-05-05 NOTE — Telephone Encounter (Signed)
Pt called to report he has just arrived home from discharge from hospital following lap chole.  When he got home, he noted his Lt foot is swelling.  He denies ankle swelling or pain.  Paged and updated Coralie Keens, PA-C, who saw the pt today; she advised pt to see his PCP for questionable onset gout.  Stated this is unrelated to his surgery and needs to see his medical doctor to assess and treat.  If he cannot see the PCP, advised go to ER.  Pt so advised and will try to call his PCP this afternoon.

## 2012-05-05 NOTE — Progress Notes (Signed)
Subjective:  Feels good today.  No junctional rhythm overnight.  Not SOB, No chest pain.  Objective:  Vital Signs in the last 24 hours: BP 147/68  Pulse 71  Temp(Src) 98.6 F (37 C) (Oral)  Resp 18  Ht 6' (1.829 m)  Wt 106 kg (233 lb 11 oz)  BMI 31.69 kg/m2  SpO2 95%  Physical Exam: Pleasant WM in NAD Lungs:  Clear Cardiac:  Regular rhythm, normal S1 and S2, no S3 Extremities:  No edema present  Intake/Output from previous day: 02/17 0701 - 02/18 0700 In: 2451 [I.V.:2451] Out: 1625 [Urine:1625]  Weight Filed Weights   05/02/12 2000  Weight: 106 kg (233 lb 11 oz)    Lab Results: Basic Metabolic Panel:  Recent Labs  05/04/12 0900 05/05/12 0445  NA 131* 137  K 4.5 4.4  CL 99 106  CO2 18* 23  GLUCOSE 286* 68*  BUN 32* 28*  CREATININE 1.98* 1.76*   CBC:  Recent Labs  05/02/12 1005  05/03/12 1500 05/04/12 0900  WBC 18.0*  < > 19.6* 12.4*  NEUTROABS 14.5*  --   --  9.9*  HGB 14.9  < > 13.5 12.4*  HCT 43.9  < > 40.5 38.2*  MCV 82.7  < > 83.3 83.0  PLT 169  < > 176 176  < > = values in this interval not displayed. Cardiac Enzymes:  Recent Labs  05/03/12 2020 05/04/12 0232 05/04/12 0900  TROPONINI <0.30 <0.30 <0.30    Telemetry: In NSR now  EKG shows LBBB and sinus rhythm  ECHO:  Concentric LVH with EF 60-65%, moderate LA enlargement Assessment/Plan:  1. Postop junctional rhythm that has resolved.  Unclear if it was present preop as no EKG was done.  It could have been related to Verapamil.  2. LBBB 3. Stage 3 CKD  Rec:  I would stop verapamil on discharge and would send home on amlodipine 5 mg daily.  He should followup with his primary MD in 1-2 weeks.  I would be happy to see if needed in the future.    Kerry Hough  MD [facility]  05/05/2012, 8:46 AM

## 2012-05-06 ENCOUNTER — Encounter (HOSPITAL_COMMUNITY): Payer: Self-pay | Admitting: General Surgery

## 2012-05-11 LAB — CULTURE, BLOOD (ROUTINE X 2): Culture: NO GROWTH

## 2012-05-12 ENCOUNTER — Other Ambulatory Visit: Payer: Self-pay | Admitting: Internal Medicine

## 2012-05-12 ENCOUNTER — Other Ambulatory Visit: Payer: Self-pay

## 2012-05-15 ENCOUNTER — Ambulatory Visit (INDEPENDENT_AMBULATORY_CARE_PROVIDER_SITE_OTHER): Payer: Medicare Other | Admitting: Family

## 2012-05-15 ENCOUNTER — Encounter: Payer: Self-pay | Admitting: Family

## 2012-05-15 VITALS — BP 132/64 | HR 72 | Temp 98.2°F | Resp 18 | Wt 230.0 lb

## 2012-05-15 DIAGNOSIS — I119 Hypertensive heart disease without heart failure: Secondary | ICD-10-CM

## 2012-05-15 DIAGNOSIS — K810 Acute cholecystitis: Secondary | ICD-10-CM

## 2012-05-15 NOTE — Patient Instructions (Signed)
Please keep upcoming appointment in April. Follow up with Dr. Hulen Skains as scheduled.

## 2012-05-18 NOTE — Progress Notes (Signed)
Subjective:    Patient ID: Vincent Allen, male    DOB: 07/24/1933, 79 y.o.   MRN: [id number]  HPI  Vincent Allen is a 79 yr old male who presents today for hospital follow up.  He was hospitalized on 2/15 and underwent a laparoscopic cholecystectomy.  Hospital records are reviewed.  His surgery was unremarkable, but post operatively he went into a junctional rhythm. Dr. Wynonia Lawman was consulted and his verapamil was stopped.  He was changed to amlodipine.     Review of Systems See HPI  Past Medical History  Diagnosis Date  . Cataract   . GERD (gastroesophageal reflux disease)   . Diabetes mellitus   . Hypertension   . Hyperlipidemia   . Colon polyps   . Chronic kidney disease stage III (GFR 30-59 ml/min) 05/03/2012  . Irregular heartbeat     History   Social History  . Marital Status: Divorced    Spouse Name: N/A    Number of Children: N/A  . Years of Education: N/A   Occupational History  . Not on file.   Social History Main Topics  . Smoking status: Never Smoker   . Smokeless tobacco: Never Used  . Alcohol Use: No  . Drug Use: No  . Sexually Active: Not on file   Other Topics Concern  . Not on file   Social History Narrative  . No narrative on file    Past Surgical History  Procedure Laterality Date  . Cataract extraction    . Rotator cuff repair    . Ankle arthroplasty    . Tonsillectomy    . Cholecystectomy    . Cholecystectomy N/A 05/03/2012    Procedure: LAPAROSCOPIC CHOLECYSTECTOMY WITH INTRAOPERATIVE CHOLANGIOGRAM;  Surgeon: [name], MD;  Location: [facility];  Service: General;  Laterality: N/A;    Family History  Problem Relation Age of Onset  . Cancer Mother     colon, breast  . Heart disease Father   . Stroke Father   . Cancer Paternal Grandmother     colon    Allergies  Allergen Reactions  . Penicillins Hives and Itching  . Verapamil     Junctional rhythm    Current Outpatient Prescriptions on File Prior to Visit  Medication Sig  Dispense Refill  . amLODipine (NORVASC) 5 MG tablet Take 1 tablet (5 mg total) by mouth daily.  30 tablet  0  . aspirin EC 81 MG tablet Take 81 mg by mouth daily.      Marland Kitchen atorvastatin (LIPITOR) 20 MG tablet Take 20 mg by mouth daily.      . Calcium 600 MG tablet Take 1 tablet (600 mg total) by mouth 2 (two) times daily.  60 tablet  0  . cholecalciferol (VITAMIN D) 400 UNITS TABS Take 400 Units by mouth 2 (two) times daily.      Marland Kitchen glipiZIDE (GLUCOTROL) 10 MG tablet Take 20 mg by mouth 2 (two) times daily before a meal.      . glucose blood test strip OneTouch Ultra. Use as instructed to check blood sugar twice a day as needed  Dx Code 250.00  100 each  3  . HYDROcodone-acetaminophen (NORCO/VICODIN) 5-325 MG per tablet Take 1-2 tablets by mouth every 6 (six) hours as needed for pain.      Marland Kitchen JANUVIA 50 MG tablet TAKE 1 TABLET POQ DAILY  30 tablet  6  . Lancets (ONETOUCH ULTRASOFT) lancets Use as instructed to check blood sugar twice a  day as needed Dx Code 250.00  100 each  3  . lisinopril (PRINIVIL,ZESTRIL) 5 MG tablet Take 5 mg by mouth daily.      . Naproxen Sodium (ALEVE) 220 MG CAPS Take 440 mg by mouth 2 (two) times daily as needed (pain).       . triamterene-hydrochlorothiazide (MAXZIDE) 75-50 MG per tablet Take 1 tablet by mouth daily.       No current facility-administered medications on file prior to visit.    BP 132/64  Pulse 72  Temp(Src) 98.2 F (36.8 C) (Oral)  Resp 18  Wt 230 lb (104.327 kg)  BMI 31.19 kg/m2  SpO2 98%       Objective:   Physical Exam  Constitutional: He appears well-developed and well-nourished. No distress.  Cardiovascular: Normal rate and regular rhythm.   No murmur heard. Pulmonary/Chest: Effort normal and breath sounds normal. No respiratory distress. He has no wheezes. He has no rales. He exhibits no tenderness.  Abdominal: Soft. Bowel sounds are normal. He exhibits no distension.  Abdominal dressings are noted to have some dry blood.             Assessment & Plan:

## 2012-05-18 NOTE — Assessment & Plan Note (Signed)
BP Readings from Last 3 Encounters:  05/15/12 132/64  05/05/12 141/59  05/05/12 149/63   HR and BP stable on amlodipine and maxide.  Continue same.

## 2012-05-18 NOTE — Assessment & Plan Note (Signed)
S/p Lap chole.  Has follow up with surgery scheduled.  Clinically stable today.

## 2012-05-26 ENCOUNTER — Ambulatory Visit (INDEPENDENT_AMBULATORY_CARE_PROVIDER_SITE_OTHER): Payer: MEDICARE | Admitting: General Surgery

## 2012-05-26 ENCOUNTER — Encounter (INDEPENDENT_AMBULATORY_CARE_PROVIDER_SITE_OTHER): Payer: Self-pay

## 2012-05-26 VITALS — BP 138/80 | HR 76 | Temp 98.0°F | Ht 72.0 in | Wt 231.0 lb

## 2012-05-26 DIAGNOSIS — K810 Acute cholecystitis: Secondary | ICD-10-CM

## 2012-05-26 NOTE — Progress Notes (Signed)
Vincent Allen 1933/10/27 [id number] 05/26/2012   Vincent Allen is a 79 y.o. male who had a laparoscopic cholecystectomy with intraoperative cholangiogram by Dr. Hulen Skains.  The pathology report confirmed Acute and chronic cholecystitis with gangrenous necrosis, cholelithiasis..  The patient reports that they are feeling well with normal bowel movements and good appetite.  The pre-operative symptoms of abdominal pain, nausea, and vomiting have resolved.    Physical examination - Incisions appear well-healed with no sign of infection or bleeding.   Abdomen - soft, non-tender BP 138/80  Pulse 76  Temp(Src) 98 F (36.7 C) (Temporal)  Ht 6' (1.829 m)  Wt 231 lb (104.781 kg)  BMI 31.32 kg/m2  SpO2 95% Impression:  s/p laparoscopic cholecystectomy  Plan:  He may resume a regular diet and full activity.  He may follow-up on a PRN basis.

## 2012-05-26 NOTE — Patient Instructions (Addendum)
Return if you have any problems.

## 2012-05-29 ENCOUNTER — Other Ambulatory Visit (HOSPITAL_COMMUNITY): Payer: Self-pay | Admitting: General Surgery

## 2012-06-22 ENCOUNTER — Ambulatory Visit: Payer: Medicare Other | Admitting: Family Medicine

## 2012-06-24 ENCOUNTER — Other Ambulatory Visit: Payer: Self-pay | Admitting: Internal Medicine

## 2012-06-24 NOTE — Telephone Encounter (Signed)
Rx request to pharmacy/[facility]  

## 2012-06-30 ENCOUNTER — Ambulatory Visit: Payer: Medicare Other | Admitting: Family Medicine

## 2012-07-06 ENCOUNTER — Other Ambulatory Visit: Payer: Self-pay | Admitting: Internal Medicine

## 2012-07-07 ENCOUNTER — Telehealth: Payer: Self-pay

## 2012-07-07 DIAGNOSIS — E785 Hyperlipidemia, unspecified: Secondary | ICD-10-CM

## 2012-07-07 DIAGNOSIS — E1122 Type 2 diabetes mellitus with diabetic chronic kidney disease: Secondary | ICD-10-CM

## 2012-07-07 LAB — CBC
Hemoglobin: 13.7 g/dL (ref 13.0–17.0)
MCH: 26.7 pg (ref 26.0–34.0)
MCV: 82.5 fL (ref 78.0–100.0)
RBC: 5.14 MIL/uL (ref 4.22–5.81)

## 2012-07-07 NOTE — Telephone Encounter (Signed)
Labs ordered.

## 2012-07-08 LAB — HEMOGLOBIN A1C: Hgb A1c MFr Bld: 8 % — ABNORMAL HIGH (ref <5.7)

## 2012-07-08 LAB — HEPATIC FUNCTION PANEL
Albumin: 3.4 g/dL — ABNORMAL LOW (ref 3.5–5.2)
Total Bilirubin: 0.6 mg/dL (ref 0.3–1.2)

## 2012-07-08 LAB — BASIC METABOLIC PANEL
Chloride: 108 mEq/L (ref 96–112)
Creat: 1.22 mg/dL (ref 0.50–1.35)
Potassium: 4.3 mEq/L (ref 3.5–5.3)

## 2012-07-08 LAB — LIPID PANEL
HDL: 28 mg/dL — ABNORMAL LOW (ref >39)
LDL Cholesterol: 70 mg/dL (ref 0–99)
Total CHOL/HDL Ratio: 4.8 Ratio
VLDL: 35 mg/dL (ref 0–40)

## 2012-07-08 LAB — MICROALBUMIN, URINE: Microalb, Ur: 1.01 mg/dL (ref 0.00–1.89)

## 2012-07-14 ENCOUNTER — Encounter: Payer: Self-pay | Admitting: Family Medicine

## 2012-07-14 ENCOUNTER — Ambulatory Visit (INDEPENDENT_AMBULATORY_CARE_PROVIDER_SITE_OTHER): Payer: Medicare Other | Admitting: Family Medicine

## 2012-07-14 VITALS — BP 130/82 | HR 64 | Temp 98.3°F | Ht 72.0 in | Wt 234.1 lb

## 2012-07-14 DIAGNOSIS — E785 Hyperlipidemia, unspecified: Secondary | ICD-10-CM

## 2012-07-14 DIAGNOSIS — I119 Hypertensive heart disease without heart failure: Secondary | ICD-10-CM

## 2012-07-14 DIAGNOSIS — E1129 Type 2 diabetes mellitus with other diabetic kidney complication: Secondary | ICD-10-CM

## 2012-07-14 DIAGNOSIS — N189 Chronic kidney disease, unspecified: Secondary | ICD-10-CM

## 2012-07-14 DIAGNOSIS — K810 Acute cholecystitis: Secondary | ICD-10-CM

## 2012-07-14 DIAGNOSIS — E1122 Type 2 diabetes mellitus with diabetic chronic kidney disease: Secondary | ICD-10-CM

## 2012-07-14 MED ORDER — AMLODIPINE BESYLATE 5 MG PO TABS: 5.0000 mg | ORAL_TABLET | Freq: Every day | ORAL | Status: DC

## 2012-07-14 NOTE — Patient Instructions (Addendum)
Labs prior to visit, lipid, renal, cbc, tsh, hepatic, hgba1c, psa  Diabetes Meal Planning Guide The diabetes meal planning guide is a tool to help you plan your meals and snacks. It is important for people with diabetes to manage their blood glucose (sugar) levels. Choosing the right foods and the right amounts throughout your day will help control your blood glucose. Eating right can even help you improve your blood pressure and reach or maintain a healthy weight. CARBOHYDRATE COUNTING MADE EASY When you eat carbohydrates, they turn to sugar. This raises your blood glucose level. Counting carbohydrates can help you control this level so you feel better. When you plan your meals by counting carbohydrates, you can have more flexibility in what you eat and balance your medicine with your food intake. Carbohydrate counting simply means adding up the total amount of carbohydrate grams in your meals and snacks. Try to eat about the same amount at each meal. Foods with carbohydrates are listed below. Each portion below is 1 carbohydrate serving or 15 grams of carbohydrates. Ask your dietician how many grams of carbohydrates you should eat at each meal or snack. Grains and Starches  1 slice bread.   English muffin or hotdog/hamburger bun.   cup cold cereal (unsweetened).   cup cooked pasta or rice.   cup starchy vegetables (corn, potatoes, peas, beans, winter squash).  1 tortilla (6 inches).   bagel.  1 waffle or pancake (size of a CD).   cup cooked cereal.  4 to 6 small crackers. *Whole grain is recommended. Fruit  1 cup fresh unsweetened berries, melon, papaya, pineapple.  1 small fresh fruit.   banana or mango.   cup fruit juice (4 oz unsweetened).   cup canned fruit in natural juice or water.  2 tbs dried fruit.  12 to 15 grapes or cherries. Milk and Yogurt  1 cup fat-free or 1% milk.  1 cup soy milk.  6 oz light yogurt with sugar-free sweetener.  6 oz low-fat  soy yogurt.  6 oz plain yogurt. Vegetables  1 cup raw or  cup cooked is counted as 0 carbohydrates or a "free" food.  If you eat 3 or more servings at 1 meal, count them as 1 carbohydrate serving. Other Carbohydrates   oz chips or pretzels.   cup ice cream or frozen yogurt.   cup sherbet or sorbet.  2 inch square cake, no frosting.  1 tbs honey, sugar, jam, jelly, or syrup.  2 small cookies.  3 squares of graham crackers.  3 cups popcorn.  6 crackers.  1 cup broth-based soup.  Count 1 cup casserole or other mixed foods as 2 carbohydrate servings.  Foods with less than 20 calories in a serving may be counted as 0 carbohydrates or a "free" food. You may want to purchase a book or computer software that lists the carbohydrate gram counts of different foods. In addition, the nutrition facts panel on the labels of the foods you eat are a good source of this information. The label will tell you how big the serving size is and the total number of carbohydrate grams you will be eating per serving. Divide this number by 15 to obtain the number of carbohydrate servings in a portion. Remember, 1 carbohydrate serving equals 15 grams of carbohydrate. SERVING SIZES Measuring foods and serving sizes helps you make sure you are getting the right amount of food. The list below tells how big or small some common serving sizes are.  1 oz.........4 stacked dice.  3 oz........Marland KitchenDeck of cards.  1 tsp.......Marland KitchenTip of little finger.  1 tbs......Marland KitchenMarland KitchenThumb.  2 tbs.......Marland KitchenGolf ball.   cup......Marland KitchenHalf of a fist.  1 cup.......Marland KitchenA fist. SAMPLE DIABETES MEAL PLAN Below is a sample meal plan that includes foods from the grain and starches, dairy, vegetable, fruit, and meat groups. A dietician can individualize a meal plan to fit your calorie needs and tell you the number of servings needed from each food group. However, controlling the total amount of carbohydrates in your meal or snack is more  important than making sure you include all of the food groups at every meal. You may interchange carbohydrate containing foods (dairy, starches, and fruits). The meal plan below is an example of a 2000 calorie diet using carbohydrate counting. This meal plan has 17 carbohydrate servings. Breakfast  1 cup oatmeal (2 carb servings).   cup light yogurt (1 carb serving).  1 cup blueberries (1 carb serving).   cup almonds. Snack  1 large apple (2 carb servings).  1 low-fat string cheese stick. Lunch  Chicken breast salad.  1 cup spinach.   cup chopped tomatoes.  2 oz chicken breast, sliced.  2 tbs low-fat [place] dressing.  12 whole-wheat crackers (2 carb servings).  12 to 15 grapes (1 carb serving).  1 cup low-fat milk (1 carb serving). Snack  1 cup carrots.   cup hummus (1 carb serving). Dinner  3 oz broiled salmon.  1 cup brown rice (3 carb servings). Snack  1  cups steamed broccoli (1 carb serving) drizzled with 1 tsp olive oil and lemon juice.  1 cup light pudding (2 carb servings). DIABETES MEAL PLANNING WORKSHEET Your dietician can use this worksheet to help you decide how many servings of foods and what types of foods are right for you.  BREAKFAST Food Group and Servings / Carb Servings Grain/Starches __________________________________ Dairy __________________________________________ Vegetable ______________________________________ Fruit ___________________________________________ Meat __________________________________________ Fat ____________________________________________ LUNCH Food Group and Servings / Carb Servings Grain/Starches ___________________________________ Dairy ___________________________________________ Fruit ____________________________________________ Meat ___________________________________________ Fat _____________________________________________ Vincent Allen Food Group and Servings / Carb Servings Grain/Starches  ___________________________________ Dairy ___________________________________________ Fruit ____________________________________________ Meat ___________________________________________ Fat _____________________________________________ SNACKS Food Group and Servings / Carb Servings Grain/Starches ___________________________________ Dairy ___________________________________________ Vegetable _______________________________________ Fruit ____________________________________________ Meat ___________________________________________ Fat _____________________________________________ DAILY TOTALS Starches _________________________ Vegetable ________________________ Fruit ____________________________ Dairy ____________________________ Meat ____________________________ Fat ______________________________ Document Released: 11/29/2004 Document Revised: 05/27/2011 Document Reviewed: 10/10/2008 [employer] Patient Information 2013 [place], [place].

## 2012-07-15 ENCOUNTER — Encounter: Payer: Self-pay | Admitting: Family Medicine

## 2012-07-15 NOTE — Assessment & Plan Note (Signed)
hgba1c 8.0 encouraged him to minimize simple carbs and increase exercise

## 2012-07-15 NOTE — Assessment & Plan Note (Signed)
Recovering well from recent surgery. Symptoms came on suddenly and have now resolved

## 2012-07-15 NOTE — Progress Notes (Signed)
Patient ID: Vincent Allen, male   DOB: Jan 16, 1934, 79 y.o.   MRN: [id number] Vincent Allen [id number] 06/14/1933 07/15/2012      Progress Note-Follow Up  Subjective  Chief Complaint  Chief Complaint  Patient presents with  . Follow-up    3 month    HPI  Patient is a 79 year old Caucasian male who is here today in. He's recently had gallbladder surgery and is healing well. He reports he had been feeling poorly for about 3 months leading up to surgery but since the surgery feels much better. Denies any fevers or chills. No abdominal pains diarrhea, constipation, anorexia or nausea. Denies chest pain or palpitations. Denies shortness or breath. Does note his blood sugars have been poorly was feeling poorly but are now improving. Blood sugars now ranging generally between 100 and 130 fasting.  Past Medical History  Diagnosis Date  . Cataract   . GERD (gastroesophageal reflux disease)   . Diabetes mellitus   . Hypertension   . Hyperlipidemia   . Colon polyps   . Chronic kidney disease stage III (GFR 30-59 ml/min) 05/03/2012  . Irregular heartbeat     Past Surgical History  Procedure Laterality Date  . Cataract extraction    . Rotator cuff repair    . Ankle arthroplasty    . Tonsillectomy    . Cholecystectomy    . Cholecystectomy N/A 05/03/2012    Procedure: LAPAROSCOPIC CHOLECYSTECTOMY WITH INTRAOPERATIVE CHOLANGIOGRAM;  Surgeon: [name], MD;  Location: [facility];  Service: General;  Laterality: N/A;    Family History  Problem Relation Age of Onset  . Cancer Mother     colon, breast  . Heart disease Father   . Stroke Father   . Cancer Paternal Grandmother     colon    History   Social History  . Marital Status: Divorced    Spouse Name: N/A    Number of Children: N/A  . Years of Education: N/A   Occupational History  . Not on file.   Social History Main Topics  . Smoking status: Never Smoker   . Smokeless tobacco: Never Used  . Alcohol Use: No  . Drug  Use: No  . Sexually Active: Not on file   Other Topics Concern  . Not on file   Social History Narrative  . No narrative on file    Current Outpatient Prescriptions on File Prior to Visit  Medication Sig Dispense Refill  . aspirin EC 81 MG tablet Take 81 mg by mouth daily.      Marland Kitchen atorvastatin (LIPITOR) 20 MG tablet TAKE 1 TABLET BY MOUTH DAILY  90 tablet  1  . Calcium 600 MG tablet Take 1 tablet (600 mg total) by mouth 2 (two) times daily.  60 tablet  0  . cholecalciferol (VITAMIN D) 400 UNITS TABS Take 400 Units by mouth 2 (two) times daily.      Marland Kitchen glipiZIDE (GLUCOTROL) 10 MG tablet Take 20 mg by mouth 2 (two) times daily before a meal.      . glucose blood test strip OneTouch Ultra. Use as instructed to check blood sugar twice a day as needed  Dx Code 250.00  100 each  3  . HYDROcodone-acetaminophen (NORCO/VICODIN) 5-325 MG per tablet Take 1-2 tablets by mouth every 6 (six) hours as needed for pain.      Marland Kitchen JANUVIA 50 MG tablet TAKE 1 TABLET POQ DAILY  30 tablet  6  . Lancets (ONETOUCH ULTRASOFT) lancets  Use as instructed to check blood sugar twice a day as needed Dx Code 250.00  100 each  3  . lisinopril (PRINIVIL,ZESTRIL) 5 MG tablet TAKE 1 TABLET BY MOUTH DAILY  90 tablet  0  . Naproxen Sodium (ALEVE) 220 MG CAPS Take 440 mg by mouth 2 (two) times daily as needed (pain).       . triamterene-hydrochlorothiazide (MAXZIDE) 75-50 MG per tablet Take 1 tablet by mouth daily.       No current facility-administered medications on file prior to visit.    Allergies  Allergen Reactions  . Penicillins Hives and Itching  . Verapamil     Junctional rhythm    Review of Systems  Review of Systems  Constitutional: Negative for fever and malaise/fatigue.  HENT: Negative for congestion.   Eyes: Negative for discharge.  Respiratory: Negative for shortness of breath.   Cardiovascular: Negative for chest pain, palpitations and leg swelling.  Gastrointestinal: Negative for nausea, abdominal  pain and diarrhea.  Genitourinary: Negative for dysuria.  Musculoskeletal: Negative for falls.  Skin: Negative for rash.  Neurological: Negative for loss of consciousness and headaches.  Endo/Heme/Allergies: Negative for polydipsia.  Psychiatric/Behavioral: Negative for depression and suicidal ideas. The patient is not nervous/anxious and does not have insomnia.     Objective  BP 130/82  Pulse 64  Temp(Src) 98.3 F (36.8 C) (Oral)  Ht 6' (1.829 m)  Wt 234 lb 1.9 oz (106.196 kg)  BMI 31.75 kg/m2  SpO2 97%  Physical Exam  Physical Exam  Constitutional: He is oriented to person, place, and time and well-developed, well-nourished, and in no distress. No distress.  HENT:  Head: Normocephalic and atraumatic.  Eyes: Conjunctivae are normal.  Neck: Neck supple. No thyromegaly present.  Cardiovascular: Normal rate, regular rhythm and normal heart sounds.   Pulmonary/Chest: Effort normal and breath sounds normal. No respiratory distress.  Abdominal: He exhibits no distension and no mass. There is no tenderness.  Musculoskeletal: He exhibits no edema.  Neurological: He is alert and oriented to person, place, and time.  Skin: Skin is warm.  Psychiatric: Memory, affect and judgment normal.    No results found for this basename: TSH   Lab Results  Component Value Date   WBC 6.2 07/07/2012   HGB 13.7 07/07/2012   HCT 42.4 07/07/2012   MCV 82.5 07/07/2012   PLT 196 07/07/2012   Lab Results  Component Value Date   CREATININE 1.22 07/07/2012   BUN 22 07/07/2012   NA 139 07/07/2012   K 4.3 07/07/2012   CL 108 07/07/2012   CO2 24 07/07/2012   Lab Results  Component Value Date   ALT 21 07/07/2012   AST 16 07/07/2012   ALKPHOS 58 07/07/2012   BILITOT 0.6 07/07/2012   Lab Results  Component Value Date   CHOL 133 07/07/2012   Lab Results  Component Value Date   HDL 28* 07/07/2012   Lab Results  Component Value Date   LDLCALC 70 07/07/2012   Lab Results  Component Value Date   TRIG  176* 07/07/2012   Lab Results  Component Value Date   CHOLHDL 4.8 07/07/2012     Assessment & Plan  Cholecystitis, acute Recovering well from recent surgery. Symptoms came on suddenly and have now resolved  Hypertensive heart disease Well controlled, no changes to meds  Diabetes mellitus with chronic kidney disease hgba1c 8.0 encouraged him to minimize simple carbs and increase exercise  Hyperlipidemia Good response to Atorvastatin, continue same

## 2012-07-15 NOTE — Assessment & Plan Note (Signed)
Well controlled, no changes to meds 

## 2012-07-15 NOTE — Assessment & Plan Note (Signed)
Good response to Atorvastatin, continue same

## 2012-08-04 ENCOUNTER — Other Ambulatory Visit: Payer: Self-pay | Admitting: Internal Medicine

## 2012-08-04 MED ORDER — GLIPIZIDE 10 MG PO TABS: 20.0000 mg | ORAL_TABLET | Freq: Two times a day (BID) | ORAL | Status: DC

## 2012-08-04 NOTE — Telephone Encounter (Signed)
Rx sent in to pharmacy. 

## 2012-08-12 ENCOUNTER — Encounter: Payer: Self-pay | Admitting: Internal Medicine

## 2012-08-12 NOTE — Telephone Encounter (Signed)
Please advise 

## 2012-08-14 ENCOUNTER — Other Ambulatory Visit: Payer: Self-pay | Admitting: Internal Medicine

## 2012-08-25 ENCOUNTER — Ambulatory Visit: Payer: Medicare Other | Admitting: Family

## 2012-09-22 ENCOUNTER — Other Ambulatory Visit: Payer: Self-pay | Admitting: Family

## 2012-09-22 NOTE — Telephone Encounter (Signed)
Rx request to pharmacy/[facility]  

## 2012-09-24 ENCOUNTER — Other Ambulatory Visit: Payer: Self-pay

## 2012-10-07 ENCOUNTER — Telehealth: Payer: Self-pay | Admitting: *Deleted

## 2012-10-07 DIAGNOSIS — Z125 Encounter for screening for malignant neoplasm of prostate: Secondary | ICD-10-CM

## 2012-10-07 DIAGNOSIS — E785 Hyperlipidemia, unspecified: Secondary | ICD-10-CM

## 2012-10-07 DIAGNOSIS — I119 Hypertensive heart disease without heart failure: Secondary | ICD-10-CM

## 2012-10-07 DIAGNOSIS — E1122 Type 2 diabetes mellitus with diabetic chronic kidney disease: Secondary | ICD-10-CM

## 2012-10-07 NOTE — Telephone Encounter (Signed)
Pt presented to the lab and orders entered per 07/14/12 office note as below:  Labs prior to visit, lipid, renal, cbc, tsh, hepatic, hgba1c, psa

## 2012-10-08 LAB — LIPID PANEL
LDL Cholesterol: 68 mg/dL (ref 0–99)
Triglycerides: 161 mg/dL — ABNORMAL HIGH (ref <150)
VLDL: 32 mg/dL (ref 0–40)

## 2012-10-08 LAB — CBC
HCT: 42.4 % (ref 39.0–52.0)
MCH: 27.3 pg (ref 26.0–34.0)
MCHC: 33.7 g/dL (ref 30.0–36.0)
MCV: 81.1 fL (ref 78.0–100.0)
RDW: 14.1 % (ref 11.5–15.5)

## 2012-10-08 LAB — RENAL FUNCTION PANEL
BUN: 37 mg/dL — ABNORMAL HIGH (ref 6–23)
CO2: 20 mEq/L (ref 19–32)
Chloride: 111 mEq/L (ref 96–112)
Creat: 1.39 mg/dL — ABNORMAL HIGH (ref 0.50–1.35)
Glucose, Bld: 113 mg/dL — ABNORMAL HIGH (ref 70–99)
Phosphorus: 3.5 mg/dL (ref 2.3–4.6)

## 2012-10-08 LAB — HEPATIC FUNCTION PANEL
ALT: 20 U/L (ref 0–53)
Bilirubin, Direct: 0.1 mg/dL (ref 0.0–0.3)
Indirect Bilirubin: 0.6 mg/dL (ref 0.0–0.9)

## 2012-10-08 LAB — PSA: PSA: 1.18 ng/mL (ref <=4.00)

## 2012-10-14 ENCOUNTER — Ambulatory Visit (INDEPENDENT_AMBULATORY_CARE_PROVIDER_SITE_OTHER): Payer: BC Managed Care – HMO | Admitting: Family Medicine

## 2012-10-14 ENCOUNTER — Encounter: Payer: Self-pay | Admitting: Family Medicine

## 2012-10-14 VITALS — BP 120/62 | HR 60 | Temp 98.3°F | Ht 72.0 in | Wt 230.0 lb

## 2012-10-14 DIAGNOSIS — N189 Chronic kidney disease, unspecified: Secondary | ICD-10-CM

## 2012-10-14 DIAGNOSIS — K219 Gastro-esophageal reflux disease without esophagitis: Secondary | ICD-10-CM

## 2012-10-14 DIAGNOSIS — I119 Hypertensive heart disease without heart failure: Secondary | ICD-10-CM

## 2012-10-14 DIAGNOSIS — E785 Hyperlipidemia, unspecified: Secondary | ICD-10-CM

## 2012-10-14 DIAGNOSIS — E1129 Type 2 diabetes mellitus with other diabetic kidney complication: Secondary | ICD-10-CM

## 2012-10-14 DIAGNOSIS — E1122 Type 2 diabetes mellitus with diabetic chronic kidney disease: Secondary | ICD-10-CM

## 2012-10-14 NOTE — Patient Instructions (Addendum)
Labs prior to next month, lipid, renal, cbc, tsh, hgba1c Start Krill oil caps such as MegaRed daily ([employer] has good price and [employer] puts it on sale 2 for 1 frequently) Start a probiotic such as Digestive Advantage, Electronics engineer or a generic daily Start a fiber supplement such as Benefiber twice a day for the diarrhea   Diabetes and Exercise Regular exercise is important and can help:   Control blood glucose (sugar).  Decrease blood pressure.    Control blood lipids (cholesterol, triglycerides).  Improve overall health. BENEFITS FROM EXERCISE  Improved fitness.  Improved flexibility.  Improved endurance.  Increased bone density.  Weight control.  Increased muscle strength.  Decreased body fat.  Improvement of the body's use of insulin, a hormone.  Increased insulin sensitivity.  Reduction of insulin needs.  Reduced stress and tension.  Helps you feel better. People with diabetes who add exercise to their lifestyle gain additional benefits, including:  Weight loss.  Reduced appetite.  Improvement of the body's use of blood glucose.  Decreased risk factors for heart disease:  Lowering of cholesterol and triglycerides.  Raising the level of good cholesterol (high-density lipoproteins, HDL).  Lowering blood sugar.  Decreased blood pressure. TYPE 1 DIABETES AND EXERCISE  Exercise will usually lower your blood glucose.  If blood glucose is greater than 240 mg/dl, check urine ketones. If ketones are present, do not exercise.  Location of the insulin injection sites may need to be adjusted with exercise. Avoid injecting insulin into areas of the body that will be exercised. For example, avoid injecting insulin into:  The arms when playing tennis.  The legs when jogging. For more information, discuss this with your caregiver.  Keep a record of:  Food intake.  Type and amount of exercise.  Expected peak times of insulin action.  Blood glucose  levels. Do this before, during, and after exercise. Review your records with your caregiver. This will help you to develop guidelines for adjusting food intake and insulin amounts.  TYPE 2 DIABETES AND EXERCISE  Regular physical activity can help control blood glucose.  Exercise is important because it may:  Increase the body's sensitivity to insulin.  Improve blood glucose control.  Exercise reduces the risk of heart disease. It decreases serum cholesterol and triglycerides. It also lowers blood pressure.  Those who take insulin or oral hypoglycemic agents should watch for signs of hypoglycemia. These signs include dizziness, shaking, sweating, chills, and confusion.  Body water is lost during exercise. It must be replaced. This will help to avoid loss of body fluids (dehydration) or heat stroke. Be sure to talk to your caregiver before starting an exercise program to make sure it is safe for you. Remember, any activity is better than none.  Document Released: 05/25/2003 Document Revised: 05/27/2011 Document Reviewed: 09/08/2008 [facility] Patient Information 2014 [place], [place].

## 2012-10-14 NOTE — Progress Notes (Signed)
Patient ID: Vincent Allen, male   DOB: 1933/11/28, 79 y.o.   MRN: [id number] Vincent Allen [id number] [birth year] 10/14/2012      Progress Note-Follow Up  Subjective  Chief Complaint  Chief Complaint  Patient presents with  . Follow-up    3 month    HPI  Patient is a 79 year old Caucasian male who is in today in followup. Blood sugars have been better. He reports numbers between 75 and 125 in a.m. No polyuria or polydipsia. No recent illness. Taking medications as prescribed. No chest pain, palpitations, shortness of breath, headache, GI or GU concerns noted.  Past Medical History  Diagnosis Date  . Cataract   . GERD (gastroesophageal reflux disease)   . Diabetes mellitus   . Hypertension   . Hyperlipidemia   . Colon polyps   . Chronic kidney disease stage III (GFR 30-59 ml/min) 05/03/2012  . Irregular heartbeat     Past Surgical History  Procedure Laterality Date  . Cataract extraction    . Rotator cuff repair    . Ankle arthroplasty    . Tonsillectomy    . Cholecystectomy    . Cholecystectomy N/A 05/03/2012    Procedure: LAPAROSCOPIC CHOLECYSTECTOMY WITH INTRAOPERATIVE CHOLANGIOGRAM;  Surgeon: [name], MD;  Location: [facility];  Service: General;  Laterality: N/A;    Family History  Problem Relation Age of Onset  . Cancer Mother     colon, breast  . Heart disease Father   . Stroke Father   . Cancer Paternal Grandmother     colon    History   Social History  . Marital Status: Divorced    Spouse Name: N/A    Number of Children: N/A  . Years of Education: N/A   Occupational History  . Not on file.   Social History Main Topics  . Smoking status: Never Smoker   . Smokeless tobacco: Never Used  . Alcohol Use: No  . Drug Use: No  . Sexually Active: Not on file   Other Topics Concern  . Not on file   Social History Narrative  . No narrative on file    Current Outpatient Prescriptions on File Prior to Visit  Medication Sig Dispense Refill  .  amLODipine (NORVASC) 5 MG tablet Take 1 tablet (5 mg total) by mouth daily.  90 tablet  1  . aspirin EC 81 MG tablet Take 81 mg by mouth daily.      Marland Kitchen atorvastatin (LIPITOR) 20 MG tablet TAKE 1 TABLET BY MOUTH DAILY  90 tablet  1  . Calcium 600 MG tablet Take 1 tablet (600 mg total) by mouth 2 (two) times daily.  60 tablet  0  . cholecalciferol (VITAMIN D) 400 UNITS TABS Take 400 Units by mouth 2 (two) times daily.      Marland Kitchen glipiZIDE (GLUCOTROL) 10 MG tablet Take 2 tablets (20 mg total) by mouth 2 (two) times daily before a meal.  120 tablet  5  . glucose blood test strip OneTouch Ultra. Use as instructed to check blood sugar twice a day as needed  Dx Code 250.00  100 each  3  . HYDROcodone-acetaminophen (NORCO/VICODIN) 5-325 MG per tablet Take 1-2 tablets by mouth every 6 (six) hours as needed for pain.      Marland Kitchen JANUVIA 50 MG tablet TAKE 1 TABLET POQ DAILY  30 tablet  6  . Lancets (ONETOUCH ULTRASOFT) lancets Use as instructed to check blood sugar twice a day as needed Dx Code  250.00  100 each  3  . lisinopril (PRINIVIL,ZESTRIL) 5 MG tablet TAKE 1 TABLET BY MOUTH DAILY  90 tablet  0  . Naproxen Sodium (ALEVE) 220 MG CAPS Take 440 mg by mouth 2 (two) times daily as needed (pain).       . triamterene-hydrochlorothiazide (MAXZIDE) 75-50 MG per tablet TAKE 1 TABLET BY MOUTH EVERY DAY  90 tablet  1   No current facility-administered medications on file prior to visit.    Allergies  Allergen Reactions  . Penicillins Hives and Itching  . Verapamil     Junctional rhythm    Review of Systems  Review of Systems  Constitutional: Negative for fever and malaise/fatigue.  HENT: Negative for congestion.   Eyes: Negative for pain and discharge.  Respiratory: Negative for shortness of breath.   Cardiovascular: Negative for chest pain, palpitations and leg swelling.  Gastrointestinal: Negative for nausea, abdominal pain and diarrhea.  Genitourinary: Negative for dysuria.  Musculoskeletal: Negative for  falls.  Skin: Negative for rash.  Neurological: Negative for loss of consciousness and headaches.  Endo/Heme/Allergies: Negative for polydipsia.  Psychiatric/Behavioral: Negative for depression and suicidal ideas. The patient is not nervous/anxious and does not have insomnia.     Objective  BP 120/62  Pulse 60  Temp(Src) 98.3 F (36.8 C) (Oral)  Ht 6' (1.829 m)  Wt 230 lb 0.6 oz (104.345 kg)  BMI 31.19 kg/m2  SpO2 97%  Physical Exam  Physical Exam  Constitutional: He is oriented to person, place, and time and well-developed, well-nourished, and in no distress. No distress.  HENT:  Head: Normocephalic and atraumatic.  Eyes: Conjunctivae are normal.  Neck: Neck supple. No thyromegaly present.  Cardiovascular: Normal rate and regular rhythm.  Exam reveals no gallop.   No murmur heard. Pulmonary/Chest: Effort normal and breath sounds normal. No respiratory distress.  Abdominal: He exhibits no distension and no mass. There is no tenderness.  Musculoskeletal: He exhibits no edema.  Neurological: He is alert and oriented to person, place, and time.  Skin: Skin is warm.  Psychiatric: Memory, affect and judgment normal.    Lab Results  Component Value Date   TSH 3.257 10/07/2012   Lab Results  Component Value Date   WBC 7.0 10/07/2012   HGB 14.3 10/07/2012   HCT 42.4 10/07/2012   MCV 81.1 10/07/2012   PLT 182 10/07/2012   Lab Results  Component Value Date   CREATININE 1.39* 10/07/2012   BUN 37* 10/07/2012   NA 139 10/07/2012   K 4.2 10/07/2012   CL 111 10/07/2012   CO2 20 10/07/2012   Lab Results  Component Value Date   ALT 20 10/07/2012   AST 14 10/07/2012   ALKPHOS 67 10/07/2012   BILITOT 0.7 10/07/2012   Lab Results  Component Value Date   CHOL 127 10/07/2012   Lab Results  Component Value Date   HDL 27* 10/07/2012   Lab Results  Component Value Date   LDLCALC 68 10/07/2012   Lab Results  Component Value Date   TRIG 161* 10/07/2012   Lab Results  Component Value  Date   CHOLHDL 4.7 10/07/2012     Assessment & Plan  Diabetes mellitus with chronic kidney disease Sugars improving, avoid simple carbs. Continue current meds, increase exercise.  Hypertensive heart disease Well controlled, no changes to current meds.  Hyperlipidemia Tolerating Lipitor, avoid trans fats, continue to monitor  GERD (gastroesophageal reflux disease) Doing well without meds, avoid offending foods

## 2012-10-18 NOTE — Assessment & Plan Note (Signed)
Sugars improving, avoid simple carbs. Continue current meds, increase exercise.

## 2012-10-18 NOTE — Assessment & Plan Note (Signed)
Doing well without meds, avoid offending foods

## 2012-10-18 NOTE — Assessment & Plan Note (Signed)
Tolerating Lipitor, avoid trans fats, continue to monitor

## 2012-10-18 NOTE — Assessment & Plan Note (Signed)
Well controlled, no changes to current meds.

## 2012-12-09 ENCOUNTER — Other Ambulatory Visit: Payer: Self-pay | Admitting: Dermatology

## 2012-12-23 ENCOUNTER — Other Ambulatory Visit: Payer: Self-pay | Admitting: *Deleted

## 2012-12-23 MED ORDER — SITAGLIPTIN PHOSPHATE 50 MG PO TABS: ORAL_TABLET | ORAL | Status: DC

## 2012-12-23 NOTE — Telephone Encounter (Signed)
Rx request to pharmacy/[facility]  

## 2013-01-01 ENCOUNTER — Other Ambulatory Visit: Payer: Self-pay | Admitting: Family Medicine

## 2013-01-01 NOTE — Telephone Encounter (Signed)
Rx request to pharmacy/[facility]  

## 2013-01-04 ENCOUNTER — Telehealth: Payer: Self-pay

## 2013-01-04 DIAGNOSIS — E1122 Type 2 diabetes mellitus with diabetic chronic kidney disease: Secondary | ICD-10-CM

## 2013-01-04 DIAGNOSIS — E785 Hyperlipidemia, unspecified: Secondary | ICD-10-CM

## 2013-01-04 NOTE — Telephone Encounter (Signed)
Lab order placed.

## 2013-01-05 LAB — CBC
HCT: 45.1 % (ref 39.0–52.0)
MCV: 80.7 fL (ref 78.0–100.0)
Platelets: 205 10*3/uL (ref 150–400)
RBC: 5.59 MIL/uL (ref 4.22–5.81)
WBC: 7.3 10*3/uL (ref 4.0–10.5)

## 2013-01-05 LAB — HEMOGLOBIN A1C
Hgb A1c MFr Bld: 8.1 % — ABNORMAL HIGH (ref <5.7)
Mean Plasma Glucose: 186 mg/dL — ABNORMAL HIGH (ref <117)

## 2013-01-05 LAB — RENAL FUNCTION PANEL
CO2: 24 mEq/L (ref 19–32)
Chloride: 104 mEq/L (ref 96–112)
Phosphorus: 3.4 mg/dL (ref 2.3–4.6)
Sodium: 140 mEq/L (ref 135–145)

## 2013-01-05 LAB — LIPID PANEL: LDL Cholesterol: 54 mg/dL (ref 0–99)

## 2013-01-10 ENCOUNTER — Other Ambulatory Visit: Payer: Self-pay | Admitting: Family Medicine

## 2013-01-11 ENCOUNTER — Encounter: Payer: Self-pay | Admitting: Family Medicine

## 2013-01-11 ENCOUNTER — Ambulatory Visit (INDEPENDENT_AMBULATORY_CARE_PROVIDER_SITE_OTHER): Payer: Medicare Other | Admitting: Family Medicine

## 2013-01-11 VITALS — BP 138/70 | HR 63 | Temp 98.1°F | Ht 72.0 in | Wt 232.1 lb

## 2013-01-11 DIAGNOSIS — I119 Hypertensive heart disease without heart failure: Secondary | ICD-10-CM

## 2013-01-11 DIAGNOSIS — E1129 Type 2 diabetes mellitus with other diabetic kidney complication: Secondary | ICD-10-CM

## 2013-01-11 DIAGNOSIS — E119 Type 2 diabetes mellitus without complications: Secondary | ICD-10-CM

## 2013-01-11 DIAGNOSIS — E785 Hyperlipidemia, unspecified: Secondary | ICD-10-CM

## 2013-01-11 DIAGNOSIS — E1122 Type 2 diabetes mellitus with diabetic chronic kidney disease: Secondary | ICD-10-CM

## 2013-01-11 DIAGNOSIS — N189 Chronic kidney disease, unspecified: Secondary | ICD-10-CM

## 2013-01-11 DIAGNOSIS — I10 Essential (primary) hypertension: Secondary | ICD-10-CM

## 2013-01-11 DIAGNOSIS — Z0000 Encounter for general adult medical examination without abnormal findings: Secondary | ICD-10-CM

## 2013-01-11 MED ORDER — METFORMIN HCL ER 500 MG PO TB24: 500.0000 mg | ORAL_TABLET | Freq: Every day | ORAL | Status: DC

## 2013-01-11 NOTE — Patient Instructions (Signed)

## 2013-01-12 ENCOUNTER — Telehealth: Payer: Self-pay | Admitting: Family Medicine

## 2013-01-12 NOTE — Telephone Encounter (Signed)
Refill- atorvastatin 20mg  tablet. Take one tablet by mouth daily. Qty 90 last fill 7.22.14

## 2013-01-13 ENCOUNTER — Other Ambulatory Visit: Payer: Self-pay | Admitting: *Deleted

## 2013-01-13 ENCOUNTER — Encounter: Payer: Self-pay | Admitting: Family Medicine

## 2013-01-13 DIAGNOSIS — I10 Essential (primary) hypertension: Secondary | ICD-10-CM | POA: Insufficient documentation

## 2013-01-13 HISTORY — DX: Essential (primary) hypertension: I10

## 2013-01-13 MED ORDER — ATORVASTATIN CALCIUM 20 MG PO TABS: ORAL_TABLET | ORAL | Status: DC

## 2013-01-13 NOTE — Assessment & Plan Note (Signed)
Tolerating Lipitor avoid trans fats, minimize simple carbs and saturated fats. Take krill oil daily

## 2013-01-13 NOTE — Assessment & Plan Note (Signed)
Well controlled, no changes 

## 2013-01-13 NOTE — Assessment & Plan Note (Signed)
Worsening hgba1c encourged decrease simple carbs given samples of Januvia 100 mg to take 1/2 tab daily and add Metformin XR 500 mg daily, check renal panel in 1 month to assess tolerability

## 2013-01-13 NOTE — Telephone Encounter (Signed)
Rx request to pharmacy/[facility]  

## 2013-01-14 ENCOUNTER — Other Ambulatory Visit: Payer: Self-pay

## 2013-01-14 MED ORDER — ATORVASTATIN CALCIUM 20 MG PO TABS: ORAL_TABLET | ORAL | Status: DC

## 2013-01-17 ENCOUNTER — Encounter: Payer: Self-pay | Admitting: Family Medicine

## 2013-01-17 DIAGNOSIS — Z0000 Encounter for general adult medical examination without abnormal findings: Secondary | ICD-10-CM | POA: Insufficient documentation

## 2013-01-17 NOTE — Progress Notes (Signed)
Patient ID: Vincent Allen, male   DOB: September 22, 1933, 79 y.o.   MRN: [id number] Vincent Allen [id number] 01/29/1934 01/17/2013      Progress Note-Follow Up  Subjective  Chief Complaint  Chief Complaint  Patient presents with  . Follow-up    3 month    HPI  Patient is a 79 male who is in today for followup. Feels well. The. Taking medications as prescribed. Is now just to has not been taking aspirin lately. Notes his blood sugars have been running roughly 140-150 before eating. Denies polyuria polydipsia. Denies chest pain or recent illness. No palpitations, shortness of breath, GI or GU concerns noted today.  Past Medical History  Diagnosis Date  . Cataract   . GERD (gastroesophageal reflux disease)   . Diabetes mellitus   . Hypertension   . Hyperlipidemia   . Colon polyps   . Chronic kidney disease stage III (GFR 30-59 ml/min) 05/03/2012  . Irregular heartbeat   . HTN (hypertension) 01/13/2013  . Preventative health care 01/17/2013    Past Surgical History  Procedure Laterality Date  . Cataract extraction    . Rotator cuff repair    . Ankle arthroplasty    . Tonsillectomy    . Cholecystectomy    . Cholecystectomy N/A 05/03/2012    Procedure: LAPAROSCOPIC CHOLECYSTECTOMY WITH INTRAOPERATIVE CHOLANGIOGRAM;  Surgeon: [name], MD;  Location: [facility];  Service: General;  Laterality: N/A;    Family History  Problem Relation Age of Onset  . Cancer Mother     colon, breast  . Heart disease Father   . Stroke Father   . Cancer Paternal Grandmother     colon    History   Social History  . Marital Status: Divorced    Spouse Name: N/A    Number of Children: N/A  . Years of Education: N/A   Occupational History  . Not on file.   Social History Main Topics  . Smoking status: Never Smoker   . Smokeless tobacco: Never Used  . Alcohol Use: No  . Drug Use: No  . Sexual Activity: Not on file   Other Topics Concern  . Not on file   Social History Narrative   . No narrative on file    Current Outpatient Prescriptions on File Prior to Visit  Medication Sig Dispense Refill  . amLODipine (NORVASC) 5 MG tablet TAKE 1 TABLET BY MOUTH DAILY  90 tablet  1  . aspirin EC 81 MG tablet Take 81 mg by mouth daily.      . Calcium 600 MG tablet Take 1 tablet (600 mg total) by mouth 2 (two) times daily.  60 tablet  0  . cholecalciferol (VITAMIN D) 400 UNITS TABS Take 400 Units by mouth 2 (two) times daily.      Marland Kitchen glipiZIDE (GLUCOTROL) 10 MG tablet Take 2 tablets (20 mg total) by mouth 2 (two) times daily before a meal.  120 tablet  5  . glucose blood test strip OneTouch Ultra. Use as instructed to check blood sugar twice a day as needed  Dx Code 250.00  100 each  3  . HYDROcodone-acetaminophen (NORCO/VICODIN) 5-325 MG per tablet Take 1-2 tablets by mouth every 6 (six) hours as needed for pain.      . Lancets (ONETOUCH ULTRASOFT) lancets Use as instructed to check blood sugar twice a day as needed Dx Code 250.00  100 each  3  . lisinopril (PRINIVIL,ZESTRIL) 5 MG tablet TAKE 1 TABLET BY  MOUTH DAILY  90 tablet  0  . Naproxen Sodium (ALEVE) 220 MG CAPS Take 440 mg by mouth 2 (two) times daily as needed (pain).       Marland Kitchen sitaGLIPtin (JANUVIA) 50 MG tablet TAKE 1 TABLET BY MOUTH DAILY  30 tablet  3  . triamterene-hydrochlorothiazide (MAXZIDE) 75-50 MG per tablet TAKE 1 TABLET BY MOUTH EVERY DAY  90 tablet  1   No current facility-administered medications on file prior to visit.    Allergies  Allergen Reactions  . Penicillins Hives and Itching  . Verapamil     Junctional rhythm    Review of Systems  Review of Systems  Constitutional: Negative for fever and malaise/fatigue.  HENT: Negative for congestion.   Eyes: Negative for discharge.  Respiratory: Negative for shortness of breath.   Cardiovascular: Negative for chest pain, palpitations and leg swelling.  Gastrointestinal: Negative for nausea, abdominal pain and diarrhea.  Genitourinary: Negative for  dysuria.  Musculoskeletal: Negative for falls.  Skin: Negative for rash.  Neurological: Negative for loss of consciousness and headaches.  Endo/Heme/Allergies: Negative for polydipsia.  Psychiatric/Behavioral: Negative for depression and suicidal ideas. The patient is not nervous/anxious and does not have insomnia.     Objective  BP 138/70  Pulse 63  Temp(Src) 98.1 F (36.7 C) (Oral)  Ht 6' (1.829 m)  Wt 232 lb 1.3 oz (105.271 kg)  BMI 31.47 kg/m2  SpO2 95%  Physical Exam  Physical Exam  Constitutional: He is oriented to person, place, and time and well-developed, well-nourished, and in no distress. No distress.  HENT:  Head: Normocephalic and atraumatic.  Eyes: Conjunctivae are normal.  Neck: Neck supple. No thyromegaly present.  Cardiovascular: Normal rate, regular rhythm and normal heart sounds.   Pulmonary/Chest: Effort normal and breath sounds normal. No respiratory distress.  Abdominal: He exhibits no distension and no mass. There is no tenderness.  Musculoskeletal: He exhibits no edema.  Neurological: He is alert and oriented to person, place, and time.  Skin: Skin is warm.  Psychiatric: Memory, affect and judgment normal.    Lab Results  Component Value Date   TSH 3.313 01/04/2013   Lab Results  Component Value Date   WBC 7.3 01/04/2013   HGB 14.9 01/04/2013   HCT 45.1 01/04/2013   MCV 80.7 01/04/2013   PLT 205 01/04/2013   Lab Results  Component Value Date   CREATININE 1.37* 01/04/2013   BUN 22 01/04/2013   NA 140 01/04/2013   K 4.2 01/04/2013   CL 104 01/04/2013   CO2 24 01/04/2013   Lab Results  Component Value Date   ALT 20 10/07/2012   AST 14 10/07/2012   ALKPHOS 67 10/07/2012   BILITOT 0.7 10/07/2012   Lab Results  Component Value Date   CHOL 125 01/04/2013   Lab Results  Component Value Date   HDL 31* 01/04/2013   Lab Results  Component Value Date   LDLCALC 54 01/04/2013   Lab Results  Component Value Date   TRIG 199* 01/04/2013    Lab Results  Component Value Date   CHOLHDL 4.0 01/04/2013     Assessment & Plan  HTN (hypertension) Well controlled, no changes  Hyperlipidemia Tolerating Lipitor avoid trans fats, minimize simple carbs and saturated fats. Take krill oil daily  Diabetes mellitus with chronic kidney disease Worsening hgba1c encourged decrease simple carbs given samples of Januvia 100 mg to take 1/2 tab daily and add Metformin XR 500 mg daily, check renal panel in 1 month  to assess tolerability  Preventative health care Declines Zostavax and flu shot today  Hypertensive heart disease BP well controlled today, asymptomatic, no changes

## 2013-01-17 NOTE — Assessment & Plan Note (Signed)
Declines Zostavax and flu shot today

## 2013-01-17 NOTE — Assessment & Plan Note (Signed)
BP well controlled today, asymptomatic, no changes

## 2013-01-21 ENCOUNTER — Other Ambulatory Visit: Payer: Self-pay

## 2013-02-03 ENCOUNTER — Other Ambulatory Visit: Payer: Self-pay | Admitting: Family Medicine

## 2013-02-16 ENCOUNTER — Other Ambulatory Visit: Payer: Self-pay | Admitting: Family Medicine

## 2013-04-01 ENCOUNTER — Telehealth: Payer: Self-pay | Admitting: Family Medicine

## 2013-04-01 NOTE — Telephone Encounter (Signed)
refill-lisinopril 5mg  tablet. Take one tablet by mouth daily. Qty 90 last fill 10.17.14

## 2013-04-02 MED ORDER — LISINOPRIL 5 MG PO TABS: 5.0000 mg | ORAL_TABLET | Freq: Every day | ORAL | Status: DC

## 2013-04-06 ENCOUNTER — Telehealth: Payer: Self-pay | Admitting: *Deleted

## 2013-04-06 DIAGNOSIS — E1122 Type 2 diabetes mellitus with diabetic chronic kidney disease: Secondary | ICD-10-CM

## 2013-04-06 DIAGNOSIS — E785 Hyperlipidemia, unspecified: Secondary | ICD-10-CM

## 2013-04-06 DIAGNOSIS — I119 Hypertensive heart disease without heart failure: Secondary | ICD-10-CM

## 2013-04-06 NOTE — Telephone Encounter (Signed)
Pt last seen in October and has upcoming appt. Pt came to lab today.  What tests and dx do you want pt to have?

## 2013-04-06 NOTE — Telephone Encounter (Signed)
Flp, liver, renal, cbc, tsh, hgba1c for hyperlipid, diabetes, htn

## 2013-04-07 LAB — CBC
HEMATOCRIT: 45.6 % (ref 39.0–52.0)
HEMOGLOBIN: 15.2 g/dL (ref 13.0–17.0)
MCH: 27.5 pg (ref 26.0–34.0)
MCHC: 33.3 g/dL (ref 30.0–36.0)
MCV: 82.6 fL (ref 78.0–100.0)
Platelets: 199 10*3/uL (ref 150–400)
RBC: 5.52 MIL/uL (ref 4.22–5.81)
RDW: 13.9 % (ref 11.5–15.5)
WBC: 5.9 10*3/uL (ref 4.0–10.5)

## 2013-04-07 LAB — HEMOGLOBIN A1C
HEMOGLOBIN A1C: 8 % — AB (ref <5.7)
Mean Plasma Glucose: 183 mg/dL — ABNORMAL HIGH (ref <117)

## 2013-04-07 NOTE — Telephone Encounter (Signed)
Lab orders entered

## 2013-04-08 LAB — HEPATIC FUNCTION PANEL
ALK PHOS: 57 U/L (ref 39–117)
ALT: 19 U/L (ref 0–53)
AST: 15 U/L (ref 0–37)
Albumin: 3.8 g/dL (ref 3.5–5.2)
BILIRUBIN DIRECT: 0.1 mg/dL (ref 0.0–0.3)
BILIRUBIN INDIRECT: 0.3 mg/dL (ref 0.0–0.9)
Total Bilirubin: 0.4 mg/dL (ref 0.3–1.2)
Total Protein: 6.6 g/dL (ref 6.0–8.3)

## 2013-04-08 LAB — RENAL FUNCTION PANEL
Albumin: 3.8 g/dL (ref 3.5–5.2)
BUN: 29 mg/dL — ABNORMAL HIGH (ref 6–23)
CO2: 25 meq/L (ref 19–32)
Calcium: 8.9 mg/dL (ref 8.4–10.5)
Chloride: 105 mEq/L (ref 96–112)
Creat: 1.38 mg/dL — ABNORMAL HIGH (ref 0.50–1.35)
GLUCOSE: 139 mg/dL — AB (ref 70–99)
POTASSIUM: 4.3 meq/L (ref 3.5–5.3)
Phosphorus: 3 mg/dL (ref 2.3–4.6)
Sodium: 138 mEq/L (ref 135–145)

## 2013-04-08 LAB — LIPID PANEL
Cholesterol: 119 mg/dL (ref 0–200)
HDL: 35 mg/dL — ABNORMAL LOW (ref >39)
LDL CALC: 56 mg/dL (ref 0–99)
TRIGLYCERIDES: 139 mg/dL (ref <150)
Total CHOL/HDL Ratio: 3.4 Ratio
VLDL: 28 mg/dL (ref 0–40)

## 2013-04-08 LAB — TSH: TSH: 2.439 u[IU]/mL (ref 0.350–4.500)

## 2013-04-12 ENCOUNTER — Other Ambulatory Visit: Payer: Self-pay | Admitting: Internal Medicine

## 2013-04-12 NOTE — Telephone Encounter (Signed)
Rx request to pharmacy/[facility]  

## 2013-04-15 ENCOUNTER — Encounter: Payer: Self-pay | Admitting: Family Medicine

## 2013-04-15 ENCOUNTER — Telehealth: Payer: Self-pay | Admitting: Family Medicine

## 2013-04-15 ENCOUNTER — Ambulatory Visit (INDEPENDENT_AMBULATORY_CARE_PROVIDER_SITE_OTHER): Payer: BC Managed Care – HMO | Admitting: Family Medicine

## 2013-04-15 VITALS — BP 126/64 | HR 59 | Temp 97.6°F | Ht 72.0 in | Wt 230.1 lb

## 2013-04-15 DIAGNOSIS — E1129 Type 2 diabetes mellitus with other diabetic kidney complication: Secondary | ICD-10-CM

## 2013-04-15 DIAGNOSIS — E1122 Type 2 diabetes mellitus with diabetic chronic kidney disease: Secondary | ICD-10-CM

## 2013-04-15 DIAGNOSIS — E785 Hyperlipidemia, unspecified: Secondary | ICD-10-CM

## 2013-04-15 DIAGNOSIS — K219 Gastro-esophageal reflux disease without esophagitis: Secondary | ICD-10-CM

## 2013-04-15 DIAGNOSIS — I10 Essential (primary) hypertension: Secondary | ICD-10-CM

## 2013-04-15 DIAGNOSIS — N183 Chronic kidney disease, stage 3 unspecified: Secondary | ICD-10-CM

## 2013-04-15 DIAGNOSIS — I119 Hypertensive heart disease without heart failure: Secondary | ICD-10-CM

## 2013-04-15 DIAGNOSIS — N189 Chronic kidney disease, unspecified: Secondary | ICD-10-CM

## 2013-04-15 NOTE — Progress Notes (Signed)
Pre visit review using our clinic review tool, if applicable. No additional management support is needed unless otherwise documented below in the visit note. 

## 2013-04-15 NOTE — Telephone Encounter (Signed)
Labs prior to visit, lipid, renal, hepatic, hgba1c, tsh  Patient will be going to [facility]

## 2013-04-15 NOTE — Patient Instructions (Addendum)
Check sugars after lunch half the week Call if worse,  Hydrate with plenty of fluids   Benign Positional Vertigo Vertigo means you feel like you or your surroundings are moving when they are not. Benign positional vertigo is the most common form of vertigo. Benign means that the cause of your condition is not serious. Benign positional vertigo is more common in older adults. CAUSES  Benign positional vertigo is the result of an upset in the labyrinth system. This is an area in the middle ear that helps control your balance. This may be caused by a viral infection, head injury, or repetitive motion. However, often no specific cause is found. SYMPTOMS  Symptoms of benign positional vertigo occur when you move your head or eyes in different directions. Some of the symptoms may include:  Loss of balance and falls.  Vomiting.  Blurred vision.  Dizziness.  Nausea.  Involuntary eye movements (nystagmus). DIAGNOSIS  Benign positional vertigo is usually diagnosed by physical exam. If the specific cause of your benign positional vertigo is unknown, your caregiver may perform imaging tests, such as magnetic resonance imaging (MRI) or computed tomography (CT). TREATMENT  Your caregiver may recommend movements or procedures to correct the benign positional vertigo. Medicines such as meclizine, benzodiazepines, and medicines for nausea may be used to treat your symptoms. In rare cases, if your symptoms are caused by certain conditions that affect the inner ear, you may need surgery. HOME CARE INSTRUCTIONS   Follow your caregiver's instructions.  Move slowly. Do not make sudden body or head movements.  Avoid driving.  Avoid operating heavy machinery.  Avoid performing any tasks that would be dangerous to you or others during a vertigo episode.  Drink enough fluids to keep your urine clear or pale yellow. SEEK IMMEDIATE MEDICAL CARE IF:   You develop problems with walking, weakness,  numbness, or using your arms, hands, or legs.  You have difficulty speaking.  You develop severe headaches.  Your nausea or vomiting continues or gets worse.  You develop visual changes.  Your family or friends notice any behavioral changes.  Your condition gets worse.  You have a fever.  You develop a stiff neck or sensitivity to light. MAKE SURE YOU:   Understand these instructions.  Will watch your condition.  Will get help right away if you are not doing well or get worse. Document Released: 12/10/2005 Document Revised: 05/27/2011 Document Reviewed: 11/22/2010 [facility] Patient Information 2014 [place].

## 2013-04-18 ENCOUNTER — Encounter: Payer: Self-pay | Admitting: Family Medicine

## 2013-04-18 NOTE — Progress Notes (Signed)
Patient ID: Vincent Allen, male   DOB: 1933/10/05, 80 y.o.   MRN: [id number] FLORY STEEVER [id number] 03/01/1934 04/18/2013      Progress Note-Follow Up  Subjective  Chief Complaint  Chief Complaint  Patient presents with  . Follow-up    3 month    HPI  Patient is a 80 year old male who is in today for routine followup. He feels well. Taking medications as prescribed. He has had some episodes of his pressures been below 80 and feeling mildly tremulous. No syncope or confusion. No headache, chest pain, palpitations or shortness of breath. No GI or GU concerns.  Past Medical History  Diagnosis Date  . Cataract   . GERD (gastroesophageal reflux disease)   . Diabetes mellitus   . Hypertension   . Hyperlipidemia   . Colon polyps   . Chronic kidney disease stage III (GFR 30-59 ml/min) 05/03/2012  . Irregular heartbeat   . HTN (hypertension) 01/13/2013  . Preventative health care 01/17/2013    Past Surgical History  Procedure Laterality Date  . Cataract extraction    . Rotator cuff repair    . Ankle arthroplasty    . Tonsillectomy    . Cholecystectomy    . Cholecystectomy N/A 05/03/2012    Procedure: LAPAROSCOPIC CHOLECYSTECTOMY WITH INTRAOPERATIVE CHOLANGIOGRAM;  Surgeon: [name], MD;  Location: [facility];  Service: General;  Laterality: N/A;    Family History  Problem Relation Age of Onset  . Cancer Mother     colon, breast  . Heart disease Father   . Stroke Father   . Cancer Paternal Grandmother     colon    History   Social History  . Marital Status: Divorced    Spouse Name: N/A    Number of Children: N/A  . Years of Education: N/A   Occupational History  . Not on file.   Social History Main Topics  . Smoking status: Never Smoker   . Smokeless tobacco: Never Used  . Alcohol Use: No  . Drug Use: No  . Sexual Activity: Not on file   Other Topics Concern  . Not on file   Social History Narrative  . No narrative on file    Current Outpatient  Prescriptions on File Prior to Visit  Medication Sig Dispense Refill  . amLODipine (NORVASC) 5 MG tablet TAKE 1 TABLET BY MOUTH DAILY  90 tablet  1  . aspirin EC 81 MG tablet Take 81 mg by mouth daily.      Marland Kitchen atorvastatin (LIPITOR) 20 MG tablet TAKE 1 TABLET BY MOUTH DAILY  90 tablet  1  . Calcium 600 MG tablet Take 1 tablet (600 mg total) by mouth 2 (two) times daily.  60 tablet  0  . cholecalciferol (VITAMIN D) 400 UNITS TABS Take 400 Units by mouth 2 (two) times daily.      Marland Kitchen glipiZIDE (GLUCOTROL) 10 MG tablet TAKE 2 TABLETS BY MOUTH TWICE A DAY BEFORE MEALS  360 tablet  1  . glucose blood (ONE TOUCH ULTRA TEST) test strip OneTouch Ultra. Use as instructed to check blood sugar twice a day as needed  Dx Code 250.40  100 each  2  . HYDROcodone-acetaminophen (NORCO/VICODIN) 5-325 MG per tablet Take 1-2 tablets by mouth every 6 (six) hours as needed for pain.      . Lancets (ONETOUCH ULTRASOFT) lancets Use as instructed to check blood sugar twice a day as needed Dx Code 250.00  100 each  3  .  lisinopril (PRINIVIL,ZESTRIL) 5 MG tablet Take 1 tablet (5 mg total) by mouth daily.  90 tablet  1  . metFORMIN (GLUCOPHAGE XR) 500 MG 24 hr tablet Take 1 tablet (500 mg total) by mouth at bedtime.  30 tablet  3  . Naproxen Sodium (ALEVE) 220 MG CAPS Take 440 mg by mouth 2 (two) times daily as needed (pain).       Marland Kitchen sitaGLIPtin (JANUVIA) 50 MG tablet TAKE 1 TABLET BY MOUTH DAILY  30 tablet  3  . triamterene-hydrochlorothiazide (MAXZIDE) 75-50 MG per tablet TAKE 1 TABLET BY MOUTH DAILY  90 tablet  1   No current facility-administered medications on file prior to visit.    Allergies  Allergen Reactions  . Penicillins Hives and Itching  . Verapamil     Junctional rhythm    Review of Systems  Review of Systems  Constitutional: Negative for fever and malaise/fatigue.  HENT: Negative for congestion.   Eyes: Negative for discharge.  Respiratory: Negative for shortness of breath.   Cardiovascular:  Negative for chest pain, palpitations and leg swelling.  Gastrointestinal: Negative for nausea, abdominal pain and diarrhea.  Genitourinary: Negative for dysuria.  Musculoskeletal: Negative for falls.  Skin: Negative for rash.  Neurological: Negative for loss of consciousness and headaches.  Endo/Heme/Allergies: Negative for polydipsia.  Psychiatric/Behavioral: Negative for depression and suicidal ideas. The patient is not nervous/anxious and does not have insomnia.     Objective  BP 126/64  Pulse 59  Temp(Src) 97.6 F (36.4 C) (Oral)  Ht 6' (1.829 m)  Wt 230 lb 1.9 oz (104.382 kg)  BMI 31.20 kg/m2  SpO2 95%  Physical Exam  Physical Exam  Constitutional: He is oriented to person, place, and time and well-developed, well-nourished, and in no distress. No distress.  HENT:  Head: Normocephalic and atraumatic.  Eyes: Conjunctivae are normal.  Neck: Neck supple. No thyromegaly present.  Cardiovascular: Normal rate, regular rhythm and normal heart sounds.   No murmur heard. Pulmonary/Chest: Effort normal and breath sounds normal. No respiratory distress.  Abdominal: He exhibits no distension and no mass. There is no tenderness.  Musculoskeletal: He exhibits no edema.  Neurological: He is alert and oriented to person, place, and time.  Skin: Skin is warm.  Psychiatric: Memory, affect and judgment normal.    Lab Results  Component Value Date   TSH 2.439 04/07/2013   Lab Results  Component Value Date   WBC 5.9 04/07/2013   HGB 15.2 04/07/2013   HCT 45.6 04/07/2013   MCV 82.6 04/07/2013   PLT 199 04/07/2013   Lab Results  Component Value Date   CREATININE 1.38* 04/07/2013   BUN 29* 04/07/2013   NA 138 04/07/2013   K 4.3 04/07/2013   CL 105 04/07/2013   CO2 25 04/07/2013   Lab Results  Component Value Date   ALT 19 04/07/2013   AST 15 04/07/2013   ALKPHOS 57 04/07/2013   BILITOT 0.4 04/07/2013   Lab Results  Component Value Date   CHOL 119 04/07/2013   Lab Results   Component Value Date   HDL 35* 04/07/2013   Lab Results  Component Value Date   LDLCALC 56 04/07/2013   Lab Results  Component Value Date   TRIG 139 04/07/2013   Lab Results  Component Value Date   CHOLHDL 3.4 04/07/2013     Assessment & Plan  HTN (hypertension) Well controlled no changes  GERD (gastroesophageal reflux disease) Well controlled on current meds, avoid offending foods and add  a probiotic  Diabetes mellitus with chronic kidney disease hgba1c 8.0 encouraged decreased po carbs. Continue current meds, has had some low numbers encouraged not to skip meals and to always encourge proteins  Hyperlipidemia Tolerating atorvastatin, avoid trans fats.

## 2013-04-18 NOTE — Assessment & Plan Note (Signed)
Tolerating atorvastatin, avoid trans fats.

## 2013-04-18 NOTE — Assessment & Plan Note (Signed)
hgba1c 8.0 encouraged decreased po carbs. Continue current meds, has had some low numbers encouraged not to skip meals and to always encourge proteins

## 2013-04-18 NOTE — Assessment & Plan Note (Signed)
Well controlled on current meds, avoid offending foods and add a probiotic

## 2013-04-18 NOTE — Assessment & Plan Note (Signed)
Well controlled no changes 

## 2013-04-19 NOTE — Telephone Encounter (Signed)
Lab order placed.

## 2013-05-05 ENCOUNTER — Other Ambulatory Visit: Payer: Self-pay | Admitting: Family Medicine

## 2013-06-30 ENCOUNTER — Telehealth: Payer: Self-pay | Admitting: Family Medicine

## 2013-06-30 NOTE — Telephone Encounter (Signed)
Refill-januvia

## 2013-07-04 ENCOUNTER — Encounter: Payer: Self-pay | Admitting: Family Medicine

## 2013-07-05 MED ORDER — SITAGLIPTIN PHOSPHATE 50 MG PO TABS: ORAL_TABLET | ORAL | Status: DC

## 2013-07-06 LAB — LIPID PANEL
CHOL/HDL RATIO: 4.1 ratio
Cholesterol: 122 mg/dL (ref 0–200)
HDL: 30 mg/dL — AB (ref >39)
LDL CALC: 58 mg/dL (ref 0–99)
Triglycerides: 168 mg/dL — ABNORMAL HIGH (ref <150)
VLDL: 34 mg/dL (ref 0–40)

## 2013-07-06 LAB — RENAL FUNCTION PANEL
ALBUMIN: 3.6 g/dL (ref 3.5–5.2)
BUN: 25 mg/dL — ABNORMAL HIGH (ref 6–23)
CALCIUM: 9 mg/dL (ref 8.4–10.5)
CO2: 23 meq/L (ref 19–32)
CREATININE: 1.2 mg/dL (ref 0.50–1.35)
Chloride: 107 mEq/L (ref 96–112)
Glucose, Bld: 97 mg/dL (ref 70–99)
Phosphorus: 3.2 mg/dL (ref 2.3–4.6)
Potassium: 4.2 mEq/L (ref 3.5–5.3)
Sodium: 139 mEq/L (ref 135–145)

## 2013-07-06 LAB — HEPATIC FUNCTION PANEL
ALBUMIN: 3.6 g/dL (ref 3.5–5.2)
ALT: 22 U/L (ref 0–53)
AST: 15 U/L (ref 0–37)
Alkaline Phosphatase: 52 U/L (ref 39–117)
BILIRUBIN TOTAL: 0.7 mg/dL (ref 0.2–1.2)
Bilirubin, Direct: 0.2 mg/dL (ref 0.0–0.3)
Indirect Bilirubin: 0.5 mg/dL (ref 0.2–1.2)
Total Protein: 6.3 g/dL (ref 6.0–8.3)

## 2013-07-06 LAB — HEMOGLOBIN A1C
HEMOGLOBIN A1C: 7.3 % — AB (ref <5.7)
Mean Plasma Glucose: 163 mg/dL — ABNORMAL HIGH (ref <117)

## 2013-07-06 LAB — TSH: TSH: 4.097 u[IU]/mL (ref 0.350–4.500)

## 2013-07-08 ENCOUNTER — Other Ambulatory Visit: Payer: Self-pay | Admitting: Family Medicine

## 2013-07-08 NOTE — Telephone Encounter (Signed)
Rx request to pharmacy/[facility]  

## 2013-07-09 ENCOUNTER — Encounter: Payer: Self-pay | Admitting: Family Medicine

## 2013-07-13 ENCOUNTER — Ambulatory Visit: Payer: Medicare Other | Admitting: Family Medicine

## 2013-07-16 ENCOUNTER — Ambulatory Visit: Payer: Medicare Other | Admitting: Family Medicine

## 2013-07-23 ENCOUNTER — Encounter: Payer: Self-pay | Admitting: Family Medicine

## 2013-07-23 ENCOUNTER — Telehealth: Payer: Self-pay | Admitting: Family Medicine

## 2013-07-23 ENCOUNTER — Ambulatory Visit (INDEPENDENT_AMBULATORY_CARE_PROVIDER_SITE_OTHER): Payer: BC Managed Care – HMO | Admitting: Family Medicine

## 2013-07-23 VITALS — BP 120/72 | HR 66 | Temp 98.0°F | Ht 72.0 in | Wt 231.0 lb

## 2013-07-23 DIAGNOSIS — K219 Gastro-esophageal reflux disease without esophagitis: Secondary | ICD-10-CM

## 2013-07-23 DIAGNOSIS — E785 Hyperlipidemia, unspecified: Secondary | ICD-10-CM

## 2013-07-23 DIAGNOSIS — I10 Essential (primary) hypertension: Secondary | ICD-10-CM

## 2013-07-23 DIAGNOSIS — G4700 Insomnia, unspecified: Secondary | ICD-10-CM

## 2013-07-23 DIAGNOSIS — Z800 Family history of malignant neoplasm of digestive organs: Secondary | ICD-10-CM

## 2013-07-23 NOTE — Telephone Encounter (Signed)
Relevant patient education assigned to patient using Emmi. ° °

## 2013-07-23 NOTE — Assessment & Plan Note (Signed)
Tolerating statin, encouraged heart healthy diet, avoid trans fats, minimize simple carbs and saturated fats. Increase exercise as tolerated 

## 2013-07-23 NOTE — Patient Instructions (Signed)

## 2013-07-23 NOTE — Assessment & Plan Note (Signed)
Well controlled, no changes to meds. Encouraged heart healthy diet such as the DASH diet and exercise as tolerated.  °

## 2013-07-23 NOTE — Assessment & Plan Note (Signed)
Encouraged good sleep hygiene such as dark, quiet room. No blue/green glowing lights such as computer screens in bedroom. No alcohol or stimulants in evening. Cut down on caffeine as able. Regular exercise is helpful but not just prior to bed time. Try Tylenol 2 at bed

## 2013-07-23 NOTE — Progress Notes (Signed)
Pre visit review using our clinic review tool, if applicable. No additional management support is needed unless otherwise documented below in the visit note. 

## 2013-07-25 NOTE — Assessment & Plan Note (Signed)
Patient reports being on a 5 year schedule and his last colonoscopy being roughly 6 years ago. Would like his next colonoscopy with [facility]. Will request old records and refer

## 2013-07-25 NOTE — Assessment & Plan Note (Signed)
Avoid offending foods, start probiotics. Do not eat large meals in late evening and consider raising head of bed.  

## 2013-07-25 NOTE — Progress Notes (Signed)
Patient ID: Vincent Allen, male   DOB: 10/02/1933, 80 y.o.   MRN: [id number] Vincent Allen [id number] Oct 05, 1933 07/25/2013      Progress Note-Follow Up  Subjective  Chief Complaint  Chief Complaint  Patient presents with  . Follow-up    HPI  Patient is a 80 year old male in today for routine medical care. He is doing well. He reports his blood sugars are ranging from 85 to 130 Before eating and between 250 after eating. Denies polyuria or polydipsia. No recent illness. Denies CP/palp/SOB/HA/congestion/fevers/GI or GU c/o. Taking meds as prescribed  Past Medical History  Diagnosis Date  . Cataract   . GERD (gastroesophageal reflux disease)   . Diabetes mellitus   . Hypertension   . Hyperlipidemia   . Colon polyps   . Chronic kidney disease stage III (GFR 30-59 ml/min) 05/03/2012  . Irregular heartbeat   . HTN (hypertension) 01/13/2013  . Preventative health care 01/17/2013    Past Surgical History  Procedure Laterality Date  . Cataract extraction    . Rotator cuff repair    . Ankle arthroplasty    . Tonsillectomy    . Cholecystectomy    . Cholecystectomy N/A 05/03/2012    Procedure: LAPAROSCOPIC CHOLECYSTECTOMY WITH INTRAOPERATIVE CHOLANGIOGRAM;  Surgeon: [name], MD;  Location: [place] [facility];  Service: General;  Laterality: N/A;    Family History  Problem Relation Age of Onset  . Cancer Mother     colon, breast  . Heart disease Father   . Stroke Father   . Cancer Paternal Grandmother     colon    History   Social History  . Marital Status: Divorced    Spouse Name: N/A    Number of Children: N/A  . Years of Education: N/A   Occupational History  . Not on file.   Social History Main Topics  . Smoking status: Never Smoker   . Smokeless tobacco: Never Used  . Alcohol Use: No  . Drug Use: No  . Sexual Activity: Not on file   Other Topics Concern  . Not on file   Social History Narrative  . No narrative on file    Current Outpatient  Prescriptions on File Prior to Visit  Medication Sig Dispense Refill  . amLODipine (NORVASC) 5 MG tablet TAKE 1 TABLET BY MOUTH DAILY  90 tablet  0  . aspirin EC 81 MG tablet Take 81 mg by mouth daily.      Marland Kitchen atorvastatin (LIPITOR) 20 MG tablet TAKE 1 TABLET BY MOUTH DAILY  90 tablet  1  . Calcium 600 MG tablet Take 1 tablet (600 mg total) by mouth 2 (two) times daily.  60 tablet  0  . cholecalciferol (VITAMIN D) 400 UNITS TABS Take 400 Units by mouth 2 (two) times daily.      Marland Kitchen glipiZIDE (GLUCOTROL) 10 MG tablet TAKE 2 TABLETS BY MOUTH TWICE A Allen BEFORE MEALS  360 tablet  1  . glucose blood (ONE TOUCH ULTRA TEST) test strip OneTouch Ultra. Use as instructed to check blood sugar twice a Allen as needed  Dx Code 250.40  100 each  2  . HYDROcodone-acetaminophen (NORCO/VICODIN) 5-325 MG per tablet Take 1-2 tablets by mouth every 6 (six) hours as needed for pain.      . Lancets (ONETOUCH ULTRASOFT) lancets Use as instructed to check blood sugar twice a Allen as needed Dx Code 250.00  100 each  3  . lisinopril (PRINIVIL,ZESTRIL) 5 MG tablet  Take 1 tablet (5 mg total) by mouth daily.  90 tablet  1  . metFORMIN (GLUCOPHAGE-XR) 500 MG 24 hr tablet TAKE 1 TABLET BY MOUTH AT BEDTIME  30 tablet  3  . Naproxen Sodium (ALEVE) 220 MG CAPS Take 440 mg by mouth 2 (two) times daily as needed (pain).       Marland Kitchen sitaGLIPtin (JANUVIA) 50 MG tablet TAKE 1 TABLET BY MOUTH DAILY  30 tablet  3  . triamterene-hydrochlorothiazide (MAXZIDE) 75-50 MG per tablet TAKE 1 TABLET BY MOUTH DAILY  90 tablet  1   No current facility-administered medications on file prior to visit.    Allergies  Allergen Reactions  . Penicillins Hives and Itching  . Verapamil     Junctional rhythm  . Influenza Vaccines Rash    Review of Systems  Review of Systems  Constitutional: Negative for fever and malaise/fatigue.  HENT: Negative for congestion.   Eyes: Negative for discharge.  Respiratory: Negative for shortness of breath.    Cardiovascular: Negative for chest pain, palpitations and leg swelling.  Gastrointestinal: Negative for nausea, abdominal pain and diarrhea.  Genitourinary: Negative for dysuria.  Musculoskeletal: Negative for falls.  Skin: Negative for rash.  Neurological: Negative for loss of consciousness and headaches.  Endo/Heme/Allergies: Negative for polydipsia.  Psychiatric/Behavioral: Negative for depression and suicidal ideas. The patient is not nervous/anxious and does not have insomnia.     Objective  BP 120/72  Pulse 66  Temp(Src) 98 F (36.7 C) (Oral)  Ht 6' (1.829 m)  Wt 231 lb 0.6 oz (104.799 kg)  BMI 31.33 kg/m2  SpO2 95%  Physical Exam  Physical Exam  Constitutional: He is oriented to person, place, and time and well-developed, well-nourished, and in no distress. No distress.  HENT:  Head: Normocephalic and atraumatic.  Eyes: Conjunctivae are normal.  Neck: Neck supple. No thyromegaly present.  Cardiovascular: Normal rate, regular rhythm and normal heart sounds.   No murmur heard. Pulmonary/Chest: Effort normal and breath sounds normal. No respiratory distress.  Abdominal: He exhibits no distension and no mass. There is no tenderness.  Musculoskeletal: He exhibits no edema.  Neurological: He is alert and oriented to person, place, and time.  Skin: Skin is warm.  Psychiatric: Memory, affect and judgment normal.    Lab Results  Component Value Date   TSH 4.097 07/06/2013   Lab Results  Component Value Date   WBC 5.9 04/07/2013   HGB 15.2 04/07/2013   HCT 45.6 04/07/2013   MCV 82.6 04/07/2013   PLT 199 04/07/2013   Lab Results  Component Value Date   CREATININE 1.20 07/06/2013   BUN 25* 07/06/2013   NA 139 07/06/2013   K 4.2 07/06/2013   CL 107 07/06/2013   CO2 23 07/06/2013   Lab Results  Component Value Date   ALT 22 07/06/2013   AST 15 07/06/2013   ALKPHOS 52 07/06/2013   BILITOT 0.7 07/06/2013   Lab Results  Component Value Date   CHOL 122 07/06/2013   Lab  Results  Component Value Date   HDL 30* 07/06/2013   Lab Results  Component Value Date   LDLCALC 58 07/06/2013   Lab Results  Component Value Date   TRIG 168* 07/06/2013   Lab Results  Component Value Date   CHOLHDL 4.1 07/06/2013     Assessment & Plan  HTN (hypertension) Well controlled, no changes to meds. Encouraged heart healthy diet such as the DASH diet and exercise as tolerated.   Hyperlipidemia Tolerating  statin, encouraged heart healthy diet, avoid trans fats, minimize simple carbs and saturated fats. Increase exercise as tolerated  Insomnia Encouraged good sleep hygiene such as dark, quiet room. No blue/green glowing lights such as computer screens in bedroom. No alcohol or stimulants in evening. Cut down on caffeine as able. Regular exercise is helpful but not just prior to bed time. Try Tylenol 2 at bed  Family history of colon cancer Patient reports being on a 5 year schedule and his last colonoscopy being roughly 6 years ago. Would like his next colonoscopy with [facility]. Will request old records and refer  GERD (gastroesophageal reflux disease) Avoid offending foods, start probiotics. Do not eat large meals in late evening and consider raising head of bed.

## 2013-08-02 ENCOUNTER — Other Ambulatory Visit: Payer: Self-pay | Admitting: Family Medicine

## 2013-08-05 ENCOUNTER — Encounter: Payer: Self-pay | Admitting: Family Medicine

## 2013-08-06 MED ORDER — LISINOPRIL 5 MG PO TABS: 5.0000 mg | ORAL_TABLET | Freq: Every day | ORAL | Status: DC

## 2013-08-13 ENCOUNTER — Telehealth: Payer: Self-pay | Admitting: Family Medicine

## 2013-08-13 MED ORDER — TRIAMTERENE-HCTZ 75-50 MG PO TABS: 1.0000 | ORAL_TABLET | Freq: Every day | ORAL | Status: DC

## 2013-08-13 NOTE — Telephone Encounter (Signed)
Refill- triamterene  [facility] [id number]

## 2013-08-24 ENCOUNTER — Other Ambulatory Visit: Payer: Self-pay | Admitting: Dermatology

## 2013-09-09 ENCOUNTER — Other Ambulatory Visit: Payer: Self-pay | Admitting: Family Medicine

## 2013-10-01 ENCOUNTER — Other Ambulatory Visit: Payer: Self-pay | Admitting: Family Medicine

## 2013-10-18 ENCOUNTER — Other Ambulatory Visit: Payer: Self-pay | Admitting: Family Medicine

## 2013-11-02 ENCOUNTER — Other Ambulatory Visit: Payer: Self-pay | Admitting: Family Medicine

## 2013-11-02 NOTE — Telephone Encounter (Signed)
Rx sent to pharmacy. LDM 

## 2013-11-30 ENCOUNTER — Other Ambulatory Visit (INDEPENDENT_AMBULATORY_CARE_PROVIDER_SITE_OTHER): Payer: BC Managed Care – HMO

## 2013-11-30 DIAGNOSIS — E785 Hyperlipidemia, unspecified: Secondary | ICD-10-CM

## 2013-11-30 DIAGNOSIS — E119 Type 2 diabetes mellitus without complications: Secondary | ICD-10-CM

## 2013-12-01 ENCOUNTER — Other Ambulatory Visit: Payer: Medicare Other

## 2013-12-02 LAB — CBC WITH DIFFERENTIAL/PLATELET
Basophils Absolute: 0 10*3/uL (ref 0.0–0.1)
Basophils Relative: 0.6 % (ref 0.0–3.0)
Eosinophils Absolute: 0.3 10*3/uL (ref 0.0–0.7)
Eosinophils Relative: 3.8 % (ref 0.0–5.0)
HEMATOCRIT: 44.4 % (ref 39.0–52.0)
HEMOGLOBIN: 14.6 g/dL (ref 13.0–17.0)
LYMPHS PCT: 25.9 % (ref 12.0–46.0)
Lymphs Abs: 1.9 10*3/uL (ref 0.7–4.0)
MCHC: 33 g/dL (ref 30.0–36.0)
MCV: 84.8 fl (ref 78.0–100.0)
MONOS PCT: 8.6 % (ref 3.0–12.0)
Monocytes Absolute: 0.6 10*3/uL (ref 0.1–1.0)
NEUTROS ABS: 4.6 10*3/uL (ref 1.4–7.7)
Neutrophils Relative %: 61.1 % (ref 43.0–77.0)
PLATELETS: 192 10*3/uL (ref 150.0–400.0)
RBC: 5.23 Mil/uL (ref 4.22–5.81)
RDW: 13.6 % (ref 11.5–15.5)
WBC: 7.5 10*3/uL (ref 4.0–10.5)

## 2013-12-02 LAB — HEPATIC FUNCTION PANEL
ALT: 24 U/L (ref 0–53)
AST: 21 U/L (ref 0–37)
Albumin: 3.7 g/dL (ref 3.5–5.2)
Alkaline Phosphatase: 61 U/L (ref 39–117)
BILIRUBIN DIRECT: 0.2 mg/dL (ref 0.0–0.3)
Total Bilirubin: 0.8 mg/dL (ref 0.2–1.2)
Total Protein: 6.9 g/dL (ref 6.0–8.3)

## 2013-12-02 LAB — LIPID PANEL
Cholesterol: 132 mg/dL (ref 0–200)
HDL: 29.7 mg/dL — ABNORMAL LOW (ref >39.00)
LDL Cholesterol: 67 mg/dL (ref 0–99)
NONHDL: 102.3
Total CHOL/HDL Ratio: 4
Triglycerides: 175 mg/dL — ABNORMAL HIGH (ref 0.0–149.0)
VLDL: 35 mg/dL (ref 0.0–40.0)

## 2013-12-02 LAB — RENAL FUNCTION PANEL
Albumin: 3.7 g/dL (ref 3.5–5.2)
BUN: 30 mg/dL — ABNORMAL HIGH (ref 6–23)
CHLORIDE: 110 meq/L (ref 96–112)
CO2: 17 meq/L — AB (ref 19–32)
Calcium: 8.7 mg/dL (ref 8.4–10.5)
Creatinine, Ser: 1.5 mg/dL (ref 0.4–1.5)
GFR: 47.7 mL/min — AB (ref >60.00)
Glucose, Bld: 135 mg/dL — ABNORMAL HIGH (ref 70–99)
POTASSIUM: 4.2 meq/L (ref 3.5–5.1)
Phosphorus: 3.5 mg/dL (ref 2.3–4.6)
SODIUM: 137 meq/L (ref 135–145)

## 2013-12-02 LAB — TSH: TSH: 2.28 u[IU]/mL (ref 0.35–4.50)

## 2013-12-02 LAB — HEMOGLOBIN A1C: Hgb A1c MFr Bld: 7.4 % — ABNORMAL HIGH (ref 4.6–6.5)

## 2013-12-06 ENCOUNTER — Encounter: Payer: Self-pay | Admitting: Family Medicine

## 2013-12-06 ENCOUNTER — Ambulatory Visit (INDEPENDENT_AMBULATORY_CARE_PROVIDER_SITE_OTHER): Payer: BC Managed Care – HMO | Admitting: Family Medicine

## 2013-12-06 VITALS — BP 150/45 | HR 54 | Temp 98.0°F | Ht 72.0 in | Wt 233.0 lb

## 2013-12-06 DIAGNOSIS — IMO0002 Reserved for concepts with insufficient information to code with codable children: Secondary | ICD-10-CM

## 2013-12-06 DIAGNOSIS — Z0000 Encounter for general adult medical examination without abnormal findings: Secondary | ICD-10-CM

## 2013-12-06 DIAGNOSIS — E118 Type 2 diabetes mellitus with unspecified complications: Principal | ICD-10-CM

## 2013-12-06 DIAGNOSIS — E1165 Type 2 diabetes mellitus with hyperglycemia: Secondary | ICD-10-CM

## 2013-12-06 DIAGNOSIS — G4700 Insomnia, unspecified: Secondary | ICD-10-CM

## 2013-12-06 DIAGNOSIS — E785 Hyperlipidemia, unspecified: Secondary | ICD-10-CM

## 2013-12-06 DIAGNOSIS — I119 Hypertensive heart disease without heart failure: Secondary | ICD-10-CM

## 2013-12-06 MED ORDER — GLIPIZIDE 10 MG PO TABS: 10.0000 mg | ORAL_TABLET | Freq: Two times a day (BID) | ORAL | Status: DC

## 2013-12-06 MED ORDER — METFORMIN HCL ER 500 MG PO TB24: 500.0000 mg | ORAL_TABLET | Freq: Two times a day (BID) | ORAL | Status: DC

## 2013-12-06 MED ORDER — SITAGLIPTIN PHOSPHATE 100 MG PO TABS: ORAL_TABLET | ORAL | Status: DC

## 2013-12-06 NOTE — Progress Notes (Signed)
Pre visit review using our clinic review tool, if applicable. No additional management support is needed unless otherwise documented below in the visit note. 

## 2013-12-06 NOTE — Patient Instructions (Signed)

## 2013-12-07 ENCOUNTER — Other Ambulatory Visit: Payer: Self-pay | Admitting: Family Medicine

## 2013-12-10 ENCOUNTER — Other Ambulatory Visit: Payer: Self-pay

## 2013-12-10 DIAGNOSIS — E118 Type 2 diabetes mellitus with unspecified complications: Principal | ICD-10-CM

## 2013-12-10 DIAGNOSIS — E1165 Type 2 diabetes mellitus with hyperglycemia: Secondary | ICD-10-CM

## 2013-12-10 DIAGNOSIS — IMO0002 Reserved for concepts with insufficient information to code with codable children: Secondary | ICD-10-CM

## 2013-12-10 MED ORDER — METFORMIN HCL ER 500 MG PO TB24: 500.0000 mg | ORAL_TABLET | Freq: Two times a day (BID) | ORAL | Status: DC

## 2013-12-12 ENCOUNTER — Encounter: Payer: Self-pay | Admitting: Family Medicine

## 2013-12-12 DIAGNOSIS — E1169 Type 2 diabetes mellitus with other specified complication: Secondary | ICD-10-CM | POA: Insufficient documentation

## 2013-12-12 DIAGNOSIS — E669 Obesity, unspecified: Secondary | ICD-10-CM | POA: Insufficient documentation

## 2013-12-12 NOTE — Assessment & Plan Note (Signed)
Tolerating statin, encouraged heart healthy diet, avoid trans fats, minimize simple carbs and saturated fats. Increase exercise as tolerated 

## 2013-12-12 NOTE — Progress Notes (Signed)
Patient ID: Vincent Allen, male   DOB: 1933/11/07, 80 y.o.   MRN: [id number] TAMARA DEMAREST [id number] 23-Feb-1934 12/12/2013      Progress Note-Follow Up  Subjective  Chief Complaint  Chief Complaint  Patient presents with  . Annual Exam    medicare welllness    HPI  Patient is a 80 year old male in today for routine medical care. Doing fairly well. No recent illness. Blood sugars have been well-controlled. Denies polyuria or polydipsia. No recent hospitalization. Denies CP/palp/SOB/HA/congestion/fevers/GI or GU c/o. Taking meds as prescribed Past Medical History  Diagnosis Date  . Cataract   . GERD (gastroesophageal reflux disease)   . Diabetes mellitus   . Hypertension   . Hyperlipidemia   . Colon polyps   . Chronic kidney disease stage III (GFR 30-59 ml/min) 05/03/2012  . Irregular heartbeat   . HTN (hypertension) 01/13/2013  . Preventative health care 01/17/2013  . Medicare annual wellness visit, subsequent 01/17/2013    Past Surgical History  Procedure Laterality Date  . Cataract extraction    . Rotator cuff repair    . Ankle arthroplasty    . Tonsillectomy    . Cholecystectomy    . Cholecystectomy N/A 05/03/2012    Procedure: LAPAROSCOPIC CHOLECYSTECTOMY WITH INTRAOPERATIVE CHOLANGIOGRAM;  Surgeon: [name], MD;  Location: [facility];  Service: General;  Laterality: N/A;    Family History  Problem Relation Age of Onset  . Cancer Mother     colon, breast  . Heart disease Father   . Stroke Father   . Cancer Paternal Grandmother     colon    History   Social History  . Marital Status: Divorced    Spouse Name: N/A    Number of Children: N/A  . Years of Education: N/A   Occupational History  . Not on file.   Social History Main Topics  . Smoking status: Never Smoker   . Smokeless tobacco: Never Used  . Alcohol Use: No  . Drug Use: No  . Sexual Activity: Not on file   Other Topics Concern  . Not on file   Social History Narrative  . No  narrative on file    Current Outpatient Prescriptions on File Prior to Visit  Medication Sig Dispense Refill  . amLODipine (NORVASC) 5 MG tablet TAKE 1 TABLET BY MOUTH EVERY DAY  90 tablet  0  . aspirin EC 81 MG tablet Take 81 mg by mouth daily.      Marland Kitchen atorvastatin (LIPITOR) 20 MG tablet TAKE 1 TABLET BY MOUTH DAILY  90 tablet  1  . Calcium 600 MG tablet Take 1 tablet (600 mg total) by mouth 2 (two) times daily.  60 tablet  0  . cholecalciferol (VITAMIN D) 400 UNITS TABS Take 400 Units by mouth 2 (two) times daily.      Marland Kitchen glucose blood (ONE TOUCH ULTRA TEST) test strip OneTouch Ultra. Use as instructed to check blood sugar twice a day as needed  Dx Code 250.40  100 each  2  . Lancets (ONETOUCH ULTRASOFT) lancets Use as instructed to check blood sugar twice a day as needed Dx Code 250.00  100 each  3  . Naproxen Sodium (ALEVE) 220 MG CAPS Take 440 mg by mouth 2 (two) times daily as needed (pain).       . triamterene-hydrochlorothiazide (MAXZIDE) 75-50 MG per tablet Take 1 tablet by mouth daily.  90 tablet  1   No current facility-administered medications on  file prior to visit.    Allergies  Allergen Reactions  . Penicillins Hives and Itching  . Verapamil     Junctional rhythm  . Influenza Vaccines Rash    Review of Systems  Review of Systems  Constitutional: Negative for fever and malaise/fatigue.  HENT: Negative for congestion.   Eyes: Negative for discharge.  Respiratory: Negative for shortness of breath.   Cardiovascular: Negative for chest pain, palpitations and leg swelling.  Gastrointestinal: Negative for nausea, abdominal pain and diarrhea.  Genitourinary: Negative for dysuria.  Musculoskeletal: Negative for falls.  Skin: Negative for rash.  Neurological: Negative for loss of consciousness and headaches.  Endo/Heme/Allergies: Negative for polydipsia.  Psychiatric/Behavioral: Negative for depression and suicidal ideas. The patient is not nervous/anxious and does not have  insomnia.     Objective  BP 150/45  Pulse 54  Temp(Src) 98 F (36.7 C) (Oral)  Ht 6' (1.829 m)  Wt 233 lb (105.688 kg)  BMI 31.59 kg/m2  SpO2 98%  Physical Exam  Physical Exam  Constitutional: He is oriented to person, place, and time and well-developed, well-nourished, and in no distress. No distress.  HENT:  Head: Normocephalic and atraumatic.  Eyes: Conjunctivae are normal.  Neck: Neck supple. No thyromegaly present.  Cardiovascular: Normal rate, regular rhythm and normal heart sounds.   No murmur heard. Pulmonary/Chest: Effort normal and breath sounds normal. No respiratory distress.  Abdominal: He exhibits no distension and no mass. There is no tenderness.  Musculoskeletal: He exhibits no edema.  Neurological: He is alert and oriented to person, place, and time.  Skin: Skin is warm.  Psychiatric: Memory, affect and judgment normal.    Lab Results  Component Value Date   TSH 2.28 12/01/2013   Lab Results  Component Value Date   WBC 7.5 12/01/2013   HGB 14.6 12/01/2013   HCT 44.4 12/01/2013   MCV 84.8 12/01/2013   PLT 192.0 12/01/2013   Lab Results  Component Value Date   CREATININE 1.5 12/01/2013   BUN 30* 12/01/2013   NA 137 12/01/2013   K 4.2 12/01/2013   CL 110 12/01/2013   CO2 17* 12/01/2013   Lab Results  Component Value Date   ALT 24 12/01/2013   AST 21 12/01/2013   ALKPHOS 61 12/01/2013   BILITOT 0.8 12/01/2013   Lab Results  Component Value Date   CHOL 132 12/01/2013   Lab Results  Component Value Date   HDL 29.70* 12/01/2013   Lab Results  Component Value Date   LDLCALC 67 12/01/2013   Lab Results  Component Value Date   TRIG 175.0* 12/01/2013   Lab Results  Component Value Date   CHOLHDL 4 12/01/2013     Assessment & Plan  Hypertensive heart disease Doing well, no recent flares  Insomnia Encouraged good sleep hygiene such as dark, quiet room. No blue/green glowing lights such as computer screens in bedroom. No alcohol or stimulants in  evening. Cut down on caffeine as able. Regular exercise is helpful but not just prior to bed time.   Hyperlipidemia Tolerating statin, encouraged heart healthy diet, avoid trans fats, minimize simple carbs and saturated fats. Increase exercise as tolerated  Type II or unspecified type diabetes mellitus with unspecified complication, uncontrolled hgba1c acceptable but up slightly, minimize simple carbs. Increase exercise as tolerated. Continue current meds, may need to increase Januvia as directed. Metformin bid  Medicare annual wellness visit, subsequent Patient denies any difficulties at home. No trouble with ADLs, depression or falls. No recent  changes to vision or hearing. Is UTD with immunizations. Is UTD with screening. Discussed Advanced Directives, patient agrees to bring [place] copies of documents if can. Encouraged heart healthy diet, exercise as tolerated and adequate sleep. Declines flu shot and Prevnar. Declines colonoscopy. Follows with opthamology

## 2013-12-12 NOTE — Assessment & Plan Note (Signed)
Encouraged good sleep hygiene such as dark, quiet room. No blue/green glowing lights such as computer screens in bedroom. No alcohol or stimulants in evening. Cut down on caffeine as able. Regular exercise is helpful but not just prior to bed time.  

## 2013-12-12 NOTE — Assessment & Plan Note (Signed)
hgba1c acceptable but up slightly, minimize simple carbs. Increase exercise as tolerated. Continue current meds, may need to increase Januvia as directed. Metformin bid

## 2013-12-12 NOTE — Assessment & Plan Note (Signed)
Patient denies any difficulties at home. No trouble with ADLs, depression or falls. No recent changes to vision or hearing. Is UTD with immunizations. Is UTD with screening. Discussed Advanced Directives, patient agrees to bring [place] copies of documents if can. Encouraged heart healthy diet, exercise as tolerated and adequate sleep. Declines flu shot and Prevnar. Declines colonoscopy. Follows with opthamology

## 2013-12-12 NOTE — Assessment & Plan Note (Signed)
Doing well, no recent flares 

## 2013-12-21 ENCOUNTER — Ambulatory Visit: Payer: Medicare Other | Attending: Orthopaedic Surgery

## 2013-12-21 DIAGNOSIS — M79651 Pain in right thigh: Secondary | ICD-10-CM | POA: Insufficient documentation

## 2013-12-21 DIAGNOSIS — Z5189 Encounter for other specified aftercare: Secondary | ICD-10-CM | POA: Diagnosis present

## 2013-12-21 DIAGNOSIS — I10 Essential (primary) hypertension: Secondary | ICD-10-CM | POA: Insufficient documentation

## 2013-12-21 DIAGNOSIS — Z9889 Other specified postprocedural states: Secondary | ICD-10-CM | POA: Diagnosis not present

## 2013-12-21 DIAGNOSIS — R5381 Other malaise: Secondary | ICD-10-CM | POA: Insufficient documentation

## 2013-12-21 DIAGNOSIS — E119 Type 2 diabetes mellitus without complications: Secondary | ICD-10-CM | POA: Diagnosis not present

## 2013-12-22 ENCOUNTER — Ambulatory Visit: Payer: Medicare Other

## 2013-12-22 DIAGNOSIS — Z5189 Encounter for other specified aftercare: Secondary | ICD-10-CM | POA: Diagnosis not present

## 2013-12-28 ENCOUNTER — Ambulatory Visit: Payer: Medicare Other | Admitting: Physical Therapy

## 2013-12-28 DIAGNOSIS — Z5189 Encounter for other specified aftercare: Secondary | ICD-10-CM | POA: Diagnosis not present

## 2013-12-30 ENCOUNTER — Ambulatory Visit: Payer: Medicare Other | Admitting: Physical Therapy

## 2013-12-30 DIAGNOSIS — Z5189 Encounter for other specified aftercare: Secondary | ICD-10-CM | POA: Diagnosis not present

## 2014-01-04 ENCOUNTER — Ambulatory Visit: Payer: Medicare Other | Admitting: Physical Therapy

## 2014-01-04 DIAGNOSIS — Z5189 Encounter for other specified aftercare: Secondary | ICD-10-CM | POA: Diagnosis not present

## 2014-01-06 ENCOUNTER — Ambulatory Visit: Payer: Medicare Other | Admitting: Physical Therapy

## 2014-01-06 DIAGNOSIS — Z5189 Encounter for other specified aftercare: Secondary | ICD-10-CM | POA: Diagnosis not present

## 2014-01-10 ENCOUNTER — Other Ambulatory Visit: Payer: Self-pay | Admitting: Family Medicine

## 2014-01-10 ENCOUNTER — Encounter: Payer: Self-pay | Admitting: Family Medicine

## 2014-01-11 ENCOUNTER — Ambulatory Visit: Payer: Medicare Other | Admitting: Physical Therapy

## 2014-01-11 DIAGNOSIS — Z5189 Encounter for other specified aftercare: Secondary | ICD-10-CM | POA: Diagnosis not present

## 2014-01-13 ENCOUNTER — Ambulatory Visit: Payer: Medicare Other

## 2014-01-13 DIAGNOSIS — Z5189 Encounter for other specified aftercare: Secondary | ICD-10-CM | POA: Diagnosis not present

## 2014-01-14 ENCOUNTER — Other Ambulatory Visit: Payer: Self-pay | Admitting: Family Medicine

## 2014-01-18 ENCOUNTER — Ambulatory Visit: Payer: Medicare Other | Attending: Orthopaedic Surgery

## 2014-01-18 DIAGNOSIS — E119 Type 2 diabetes mellitus without complications: Secondary | ICD-10-CM | POA: Insufficient documentation

## 2014-01-18 DIAGNOSIS — Z5189 Encounter for other specified aftercare: Secondary | ICD-10-CM | POA: Insufficient documentation

## 2014-01-18 DIAGNOSIS — M79651 Pain in right thigh: Secondary | ICD-10-CM | POA: Insufficient documentation

## 2014-01-18 DIAGNOSIS — Z9889 Other specified postprocedural states: Secondary | ICD-10-CM | POA: Insufficient documentation

## 2014-01-18 DIAGNOSIS — I10 Essential (primary) hypertension: Secondary | ICD-10-CM | POA: Insufficient documentation

## 2014-01-18 DIAGNOSIS — R5381 Other malaise: Secondary | ICD-10-CM | POA: Insufficient documentation

## 2014-01-20 ENCOUNTER — Ambulatory Visit: Payer: Medicare Other

## 2014-01-20 DIAGNOSIS — R5381 Other malaise: Secondary | ICD-10-CM | POA: Diagnosis not present

## 2014-01-20 DIAGNOSIS — M79651 Pain in right thigh: Secondary | ICD-10-CM | POA: Diagnosis not present

## 2014-01-20 DIAGNOSIS — Z5189 Encounter for other specified aftercare: Secondary | ICD-10-CM | POA: Diagnosis not present

## 2014-01-20 DIAGNOSIS — I10 Essential (primary) hypertension: Secondary | ICD-10-CM | POA: Diagnosis not present

## 2014-01-20 DIAGNOSIS — Z9889 Other specified postprocedural states: Secondary | ICD-10-CM | POA: Diagnosis not present

## 2014-01-20 DIAGNOSIS — E119 Type 2 diabetes mellitus without complications: Secondary | ICD-10-CM | POA: Diagnosis not present

## 2014-02-09 LAB — HM DIABETES EYE EXAM

## 2014-02-21 LAB — HM DIABETES EYE EXAM

## 2014-02-28 HISTORY — PX: CATARACT EXTRACTION: SUR2

## 2014-03-02 ENCOUNTER — Other Ambulatory Visit: Payer: Self-pay | Admitting: Family Medicine

## 2014-03-03 ENCOUNTER — Other Ambulatory Visit: Payer: Self-pay | Admitting: Family Medicine

## 2014-03-07 ENCOUNTER — Other Ambulatory Visit (INDEPENDENT_AMBULATORY_CARE_PROVIDER_SITE_OTHER): Payer: Medicare Other

## 2014-03-07 DIAGNOSIS — I10 Essential (primary) hypertension: Secondary | ICD-10-CM

## 2014-03-07 DIAGNOSIS — E785 Hyperlipidemia, unspecified: Secondary | ICD-10-CM

## 2014-03-07 DIAGNOSIS — E1122 Type 2 diabetes mellitus with diabetic chronic kidney disease: Secondary | ICD-10-CM

## 2014-03-07 DIAGNOSIS — N181 Chronic kidney disease, stage 1: Secondary | ICD-10-CM

## 2014-03-08 LAB — CBC WITH DIFFERENTIAL/PLATELET
Basophils Absolute: 0 10*3/uL (ref 0.0–0.1)
Basophils Relative: 0.6 % (ref 0.0–3.0)
EOS ABS: 0.4 10*3/uL (ref 0.0–0.7)
Eosinophils Relative: 5.9 % — ABNORMAL HIGH (ref 0.0–5.0)
HCT: 43.7 % (ref 39.0–52.0)
Hemoglobin: 13.9 g/dL (ref 13.0–17.0)
LYMPHS ABS: 1.9 10*3/uL (ref 0.7–4.0)
Lymphocytes Relative: 26 % (ref 12.0–46.0)
MCHC: 31.9 g/dL (ref 30.0–36.0)
MCV: 85.3 fl (ref 78.0–100.0)
MONO ABS: 0.7 10*3/uL (ref 0.1–1.0)
Monocytes Relative: 9.6 % (ref 3.0–12.0)
NEUTROS PCT: 57.9 % (ref 43.0–77.0)
Neutro Abs: 4.2 10*3/uL (ref 1.4–7.7)
PLATELETS: 212 10*3/uL (ref 150.0–400.0)
RBC: 5.12 Mil/uL (ref 4.22–5.81)
RDW: 13.8 % (ref 11.5–15.5)
WBC: 7.2 10*3/uL (ref 4.0–10.5)

## 2014-03-08 LAB — HEMOGLOBIN A1C: HEMOGLOBIN A1C: 7.2 % — AB (ref 4.6–6.5)

## 2014-03-08 LAB — HEPATIC FUNCTION PANEL
ALT: 20 U/L (ref 0–53)
AST: 24 U/L (ref 0–37)
Albumin: 3.5 g/dL (ref 3.5–5.2)
Alkaline Phosphatase: 51 U/L (ref 39–117)
BILIRUBIN TOTAL: 0.4 mg/dL (ref 0.2–1.2)
Bilirubin, Direct: 0.1 mg/dL (ref 0.0–0.3)
Total Protein: 6.6 g/dL (ref 6.0–8.3)

## 2014-03-08 LAB — LIPID PANEL
Cholesterol: 116 mg/dL (ref 0–200)
HDL: 24.1 mg/dL — AB (ref >39.00)
LDL CALC: 59 mg/dL (ref 0–99)
NONHDL: 91.9
Total CHOL/HDL Ratio: 5
Triglycerides: 164 mg/dL — ABNORMAL HIGH (ref 0.0–149.0)
VLDL: 32.8 mg/dL (ref 0.0–40.0)

## 2014-03-08 LAB — RENAL FUNCTION PANEL
Albumin: 3.5 g/dL (ref 3.5–5.2)
BUN: 28 mg/dL — AB (ref 6–23)
CALCIUM: 8.9 mg/dL (ref 8.4–10.5)
CO2: 21 meq/L (ref 19–32)
CREATININE: 1.4 mg/dL (ref 0.4–1.5)
Chloride: 108 mEq/L (ref 96–112)
GFR: 53.33 mL/min — ABNORMAL LOW (ref >60.00)
GLUCOSE: 97 mg/dL (ref 70–99)
Phosphorus: 3.2 mg/dL (ref 2.3–4.6)
Potassium: 4.2 mEq/L (ref 3.5–5.1)
SODIUM: 138 meq/L (ref 135–145)

## 2014-03-08 LAB — TSH: TSH: 2.97 u[IU]/mL (ref 0.35–4.50)

## 2014-03-14 ENCOUNTER — Ambulatory Visit (INDEPENDENT_AMBULATORY_CARE_PROVIDER_SITE_OTHER): Payer: Medicare Other | Admitting: Family Medicine

## 2014-03-14 ENCOUNTER — Encounter: Payer: Self-pay | Admitting: Family Medicine

## 2014-03-14 VITALS — BP 135/51 | HR 66 | Temp 98.4°F | Ht 72.0 in | Wt 231.4 lb

## 2014-03-14 DIAGNOSIS — E669 Obesity, unspecified: Secondary | ICD-10-CM

## 2014-03-14 DIAGNOSIS — N183 Chronic kidney disease, stage 3 unspecified: Secondary | ICD-10-CM

## 2014-03-14 DIAGNOSIS — E1169 Type 2 diabetes mellitus with other specified complication: Secondary | ICD-10-CM

## 2014-03-14 DIAGNOSIS — B353 Tinea pedis: Secondary | ICD-10-CM | POA: Insufficient documentation

## 2014-03-14 DIAGNOSIS — E785 Hyperlipidemia, unspecified: Secondary | ICD-10-CM

## 2014-03-14 DIAGNOSIS — M79675 Pain in left toe(s): Secondary | ICD-10-CM | POA: Insufficient documentation

## 2014-03-14 DIAGNOSIS — I10 Essential (primary) hypertension: Secondary | ICD-10-CM

## 2014-03-14 DIAGNOSIS — K219 Gastro-esophageal reflux disease without esophagitis: Secondary | ICD-10-CM

## 2014-03-14 DIAGNOSIS — E119 Type 2 diabetes mellitus without complications: Secondary | ICD-10-CM

## 2014-03-14 DIAGNOSIS — E782 Mixed hyperlipidemia: Secondary | ICD-10-CM

## 2014-03-14 LAB — URIC ACID: Uric Acid, Serum: 7 mg/dL (ref 4.0–7.8)

## 2014-03-14 LAB — COMPLETE METABOLIC PANEL WITH GFR
ALK PHOS: 64 U/L (ref 39–117)
ALT: 23 U/L (ref 0–53)
AST: 16 U/L (ref 0–37)
Albumin: 3.8 g/dL (ref 3.5–5.2)
BILIRUBIN TOTAL: 0.5 mg/dL (ref 0.2–1.2)
BUN: 27 mg/dL — AB (ref 6–23)
CO2: 20 mEq/L (ref 19–32)
CREATININE: 1.41 mg/dL — AB (ref 0.50–1.35)
Calcium: 9 mg/dL (ref 8.4–10.5)
Chloride: 107 mEq/L (ref 96–112)
GFR, EST NON AFRICAN AMERICAN: 47 mL/min — AB
GFR, Est African American: 55 mL/min — ABNORMAL LOW
GLUCOSE: 105 mg/dL — AB (ref 70–99)
Potassium: 4.5 mEq/L (ref 3.5–5.3)
Sodium: 138 mEq/L (ref 135–145)
Total Protein: 6.6 g/dL (ref 6.0–8.3)

## 2014-03-14 LAB — CBC
HEMATOCRIT: 44.4 % (ref 39.0–52.0)
HEMOGLOBIN: 14.3 g/dL (ref 13.0–17.0)
MCHC: 32.1 g/dL (ref 30.0–36.0)
MCV: 85.3 fl (ref 78.0–100.0)
Platelets: 216 10*3/uL (ref 150.0–400.0)
RBC: 5.2 Mil/uL (ref 4.22–5.81)
RDW: 13.4 % (ref 11.5–15.5)
WBC: 8.4 10*3/uL (ref 4.0–10.5)

## 2014-03-14 LAB — LDL CHOLESTEROL, DIRECT: Direct LDL: 70.8 mg/dL

## 2014-03-14 LAB — TSH: TSH: 1.96 u[IU]/mL (ref 0.35–4.50)

## 2014-03-14 LAB — LIPID PANEL
CHOLESTEROL: 135 mg/dL (ref 0–200)
HDL: 29.9 mg/dL — AB (ref >39.00)
NonHDL: 105.1
Total CHOL/HDL Ratio: 5
Triglycerides: 232 mg/dL — ABNORMAL HIGH (ref 0.0–149.0)
VLDL: 46.4 mg/dL — AB (ref 0.0–40.0)

## 2014-03-14 LAB — HEMOGLOBIN A1C: Hgb A1c MFr Bld: 7.1 % — ABNORMAL HIGH (ref 4.6–6.5)

## 2014-03-14 MED ORDER — TERBINAFINE HCL 1 % EX CREA: 1.0000 "application " | TOPICAL_CREAM | Freq: Two times a day (BID) | CUTANEOUS | Status: DC

## 2014-03-14 NOTE — Assessment & Plan Note (Signed)
Stable encouraged adequate hydration

## 2014-03-14 NOTE — Assessment & Plan Note (Signed)
Suffered a serious crush injury to this foot back in 1965 requiring surgical pinning. Has always suffered with some swelling in this foot as a result but over the past few weeks has been having increased discomfort sensitivity between fourth and fifth toes and along lateral aspect of left foot c/w gout. Even the light touch of a sheet is uncomfortable. Will check uric acid and encouraged Salon Pas gel bid and if no improvement then refer to podiatry for further consideration

## 2014-03-14 NOTE — Assessment & Plan Note (Signed)
Between 4th and 5th toe, encouraged Lamisil topically to foot 2 x a day

## 2014-03-14 NOTE — Assessment & Plan Note (Signed)
hgba1c acceptable, minimize simple carbs. Increase exercise as tolerated. Continue current meds, improving encouraged to minimize carbs slightly more and recheck in 3 months

## 2014-03-14 NOTE — Progress Notes (Signed)
Pre visit review using our clinic review tool, if applicable. No additional management support is needed unless otherwise documented below in the visit note. 

## 2014-03-14 NOTE — Patient Instructions (Addendum)
Try Salon Pas gel or  Aspercreme to sore spot twice daily  Apply Lamisil between 4th and 5th toes twice a day for Athlete's foot and call if no improvement for possible referral   Start a probiotic daily such as Digestive Advantage or [place] for diarrhea nd to keep infections away   Gout Gout is an inflammatory arthritis caused by a buildup of uric acid crystals in the joints. Uric acid is a chemical that is normally present in the blood. When the level of uric acid in the blood is too high it can form crystals that deposit in your joints and tissues. This causes joint redness, soreness, and swelling (inflammation). Repeat attacks are common. Over time, uric acid crystals can form into masses (tophi) near a joint, destroying bone and causing disfigurement. Gout is treatable and often preventable. CAUSES  The disease begins with elevated levels of uric acid in the blood. Uric acid is produced by your body when it breaks down a naturally found substance called purines. Certain foods you eat, such as meats and fish, contain high amounts of purines. Causes of an elevated uric acid level include:  Being passed down from parent to child (heredity).  Diseases that cause increased uric acid production (such as obesity, psoriasis, and certain cancers).  Excessive alcohol use.  Diet, especially diets rich in meat and seafood.  Medicines, including certain cancer-fighting medicines (chemotherapy), water pills (diuretics), and aspirin.  Chronic kidney disease. The kidneys are no longer able to remove uric acid well.  Problems with metabolism. Conditions strongly associated with gout include:  Obesity.  High blood pressure.  High cholesterol.  Diabetes. Not everyone with elevated uric acid levels gets gout. It is not understood why some people get gout and others do not. Surgery, joint injury, and eating too much of certain foods are some of the factors that can lead to gout  attacks. SYMPTOMS   An attack of gout comes on quickly. It causes intense pain with redness, swelling, and warmth in a joint.  Fever can occur.  Often, only one joint is involved. Certain joints are more commonly involved:  Base of the big toe.  Knee.  Ankle.  Wrist.  Finger. Without treatment, an attack usually goes away in a few days to weeks. Between attacks, you usually will not have symptoms, which is different from many other forms of arthritis. DIAGNOSIS  Your caregiver will suspect gout based on your symptoms and exam. In some cases, tests may be recommended. The tests may include:  Blood tests.  Urine tests.  X-rays.  Joint fluid exam. This exam requires a needle to remove fluid from the joint (arthrocentesis). Using a microscope, gout is confirmed when uric acid crystals are seen in the joint fluid. TREATMENT  There are two phases to gout treatment: treating the sudden onset (acute) attack and preventing attacks (prophylaxis).  Treatment of an Acute Attack.  Medicines are used. These include anti-inflammatory medicines or steroid medicines.  An injection of steroid medicine into the affected joint is sometimes necessary.  The painful joint is rested. Movement can worsen the arthritis.  You may use warm or cold treatments on painful joints, depending which works best for you.  Treatment to Prevent Attacks.  If you suffer from frequent gout attacks, your caregiver may advise preventive medicine. These medicines are started after the acute attack subsides. These medicines either help your kidneys eliminate uric acid from your body or decrease your uric acid production. You may need  to stay on these medicines for a very long time.  The early phase of treatment with preventive medicine can be associated with an increase in acute gout attacks. For this reason, during the first few months of treatment, your caregiver may also advise you to take medicines usually used  for acute gout treatment. Be sure you understand your caregiver's directions. Your caregiver may make several adjustments to your medicine dose before these medicines are effective.  Discuss dietary treatment with your caregiver or dietitian. Alcohol and drinks high in sugar and fructose and foods such as meat, poultry, and seafood can increase uric acid levels. Your caregiver or dietitian can advise you on drinks and foods that should be limited. HOME CARE INSTRUCTIONS   Do not take aspirin to relieve pain. This raises uric acid levels.  Only take over-the-counter or prescription medicines for pain, discomfort, or fever as directed by your caregiver.  Rest the joint as much as possible. When in bed, keep sheets and blankets off painful areas.  Keep the affected joint raised (elevated).  Apply warm or cold treatments to painful joints. Use of warm or cold treatments depends on which works best for you.  Use crutches if the painful joint is in your leg.  Drink enough fluids to keep your urine clear or pale yellow. This helps your body get rid of uric acid. Limit alcohol, sugary drinks, and fructose drinks.  Follow your dietary instructions. Pay careful attention to the amount of protein you eat. Your daily diet should emphasize fruits, vegetables, whole grains, and fat-free or low-fat milk products. Discuss the use of coffee, vitamin C, and cherries with your caregiver or dietitian. These may be helpful in lowering uric acid levels.  Maintain a healthy body weight. SEEK MEDICAL CARE IF:   You develop diarrhea, vomiting, or any side effects from medicines.  You do not feel better in 24 hours, or you are getting worse. SEEK IMMEDIATE MEDICAL CARE IF:   Your joint becomes suddenly more tender, and you have chills or a fever. MAKE SURE YOU:   Understand these instructions.  Will watch your condition.  Will get help right away if you are not doing well or get worse. Document Released:  03/01/2000 Document Revised: 07/19/2013 Document Reviewed: 10/16/2011 [facility] Patient Information 2015 [place], [place]. This information is not intended to replace advice given to you by your health care provider. Make sure you discuss any questions you have with your health care provider.

## 2014-03-14 NOTE — Progress Notes (Signed)
Vincent Allen  DH:[id number] 1933/10/28 03/14/2014      Progress Note-Follow Up  Subjective  Chief Complaint  Chief Complaint  Patient presents with  . Follow-up    3 mos    HPI  Patient is a 80 y.o. male in today for routine medical care. Patient is in today for routine follow-up. His greatest complaint is of some pain in his left foot. He describes it as a sensitivity and occurring between his fourth and fifth toe and along the lateral aspect of the foot. He denies any recent injury but he notes in 1965 he suffered a significant crush injury requiring surgical correction. Has had trouble with swelling ever since but now has pain and describes discomfort even with the sheet touching his foot. There has been some warmth as well. He also complains of roughly one loose stool daily but no bloody or tarry stool. Sugars continue to spike above 200 after eating but are improving. Denies polyuria or polydipsia. Denies CP/palp/SOB/HA/congestion/fevers/GI or GU c/o. Taking meds as prescribed  Past Medical History  Diagnosis Date  . Cataract   . GERD (gastroesophageal reflux disease)   . Diabetes mellitus   . Hypertension   . Hyperlipidemia   . Colon polyps   . Chronic kidney disease stage III (GFR 30-59 ml/min) 05/03/2012  . Irregular heartbeat   . HTN (hypertension) 01/13/2013  . Preventative health care 01/17/2013  . Medicare annual wellness visit, subsequent 01/17/2013    Past Surgical History  Procedure Laterality Date  . Cataract extraction    . Rotator cuff repair    . Ankle arthroplasty    . Tonsillectomy    . Cholecystectomy    . Cholecystectomy N/A 05/03/2012    Procedure: LAPAROSCOPIC CHOLECYSTECTOMY WITH INTRAOPERATIVE CHOLANGIOGRAM;  Surgeon: [name], MD;  Location: [facility];  Service: General;  Laterality: N/A;  . Cataract extraction  02/28/14    Family History  Problem Relation Age of Onset  . Cancer Mother     colon, breast  . Heart disease Father   .  Stroke Father   . Cancer Paternal Grandmother     colon    History   Social History  . Marital Status: Divorced    Spouse Name: N/A    Number of Children: N/A  . Years of Education: N/A   Occupational History  . Not on file.   Social History Main Topics  . Smoking status: Never Smoker   . Smokeless tobacco: Never Used  . Alcohol Use: No  . Drug Use: No  . Sexual Activity: Not on file   Other Topics Concern  . Not on file   Social History Narrative    Current Outpatient Prescriptions on File Prior to Visit  Medication Sig Dispense Refill  . amLODipine (NORVASC) 5 MG tablet TAKE 1 TABLET BY MOUTH EVERY DAY 90 tablet 1  . aspirin EC 81 MG tablet Take 81 mg by mouth daily.    Marland Kitchen atorvastatin (LIPITOR) 20 MG tablet TAKE 1 TABLET BY MOUTH DAILY 90 tablet 1  . Calcium 600 MG tablet Take 1 tablet (600 mg total) by mouth 2 (two) times daily. 60 tablet 0  . cholecalciferol (VITAMIN D) 400 UNITS TABS Take 400 Units by mouth 2 (two) times daily.    Marland Kitchen glipiZIDE (GLUCOTROL) 10 MG tablet Take 1 tablet (10 mg total) by mouth 2 (two) times daily before a meal. 180 tablet 1  . Lancets (ONETOUCH ULTRASOFT) lancets Use as instructed to check  blood sugar twice a day as needed Dx Code 250.00 100 each 3  . lisinopril (PRINIVIL,ZESTRIL) 5 MG tablet TAKE 1 TABLET BY MOUTH EVERY DAY 90 tablet 0  . metFORMIN (GLUCOPHAGE-XR) 500 MG 24 hr tablet Take 1 tablet (500 mg total) by mouth 2 (two) times daily. 180 tablet 1  . Naproxen Sodium (ALEVE) 220 MG CAPS Take 440 mg by mouth 2 (two) times daily as needed (pain).     . ONE TOUCH ULTRA TEST test strip Use as directed. Test blood sugar twice daily as needed. 100 each 1  . sitaGLIPtin (JANUVIA) 100 MG tablet 1/2 to 1 tab po daily 30 tablet 5  . triamterene-hydrochlorothiazide (MAXZIDE) 75-50 MG per tablet TAKE 1 TABLET BY MOUTH DAILY. 90 tablet 0   No current facility-administered medications on file prior to visit.    Allergies  Allergen Reactions    . Penicillins Hives and Itching  . Verapamil     Junctional rhythm  . Influenza Vaccines Rash    Review of Systems  Review of Systems  Constitutional: Negative for fever and malaise/fatigue.  HENT: Negative for congestion.   Eyes: Negative for discharge.  Respiratory: Negative for shortness of breath.   Cardiovascular: Negative for chest pain, palpitations and leg swelling.  Gastrointestinal: Negative for nausea, abdominal pain and diarrhea.  Genitourinary: Negative for dysuria.  Musculoskeletal: Positive for joint pain. Negative for falls.  Skin: Negative for rash.  Neurological: Negative for loss of consciousness and headaches.  Endo/Heme/Allergies: Negative for polydipsia.  Psychiatric/Behavioral: Negative for depression and suicidal ideas. The patient is not nervous/anxious and does not have insomnia.     Objective  BP 135/51 mmHg  Pulse 66  Temp(Src) 98.4 F (36.9 C) (Oral)  Ht 6' (1.829 m)  Wt 231 lb 6.4 oz (104.962 kg)  BMI 31.38 kg/m2  SpO2 97%  Physical Exam  Physical Exam  Constitutional: He is oriented to person, place, and time and well-developed, well-nourished, and in no distress. No distress.  HENT:  Head: Normocephalic and atraumatic.  Eyes: Conjunctivae are normal.  Neck: Neck supple. No thyromegaly present.  Cardiovascular: Normal rate, regular rhythm and normal heart sounds.   No murmur heard. Pulmonary/Chest: Effort normal and breath sounds normal. No respiratory distress.  Abdominal: He exhibits no distension and no mass. There is no tenderness.  Musculoskeletal: He exhibits no edema.  Neurological: He is alert and oriented to person, place, and time.  Skin: Skin is warm. Rash noted.  Macerated skin between 4th and fifth toe but no erythema or swelling. Scar along lateral aspect of foot c/w injury and surgery. Minimal swelling no erythema.  Psychiatric: Memory, affect and judgment normal.    Lab Results  Component Value Date   TSH 2.97  03/08/2014   Lab Results  Component Value Date   WBC 7.2 03/08/2014   HGB 13.9 03/08/2014   HCT 43.7 03/08/2014   MCV 85.3 03/08/2014   PLT 212.0 03/08/2014   Lab Results  Component Value Date   CREATININE 1.4 03/08/2014   BUN 28* 03/08/2014   NA 138 03/08/2014   K 4.2 03/08/2014   CL 108 03/08/2014   CO2 21 03/08/2014   Lab Results  Component Value Date   ALT 20 03/08/2014   AST 24 03/08/2014   ALKPHOS 51 03/08/2014   BILITOT 0.4 03/08/2014   Lab Results  Component Value Date   CHOL 116 03/08/2014   Lab Results  Component Value Date   HDL 24.10* 03/08/2014   Lab  Results  Component Value Date   LDLCALC 59 03/08/2014   Lab Results  Component Value Date   TRIG 164.0* 03/08/2014   Lab Results  Component Value Date   CHOLHDL 5 03/08/2014     Assessment & Plan  HTN (hypertension) Well controlled, no changes to meds. Encouraged heart healthy diet such as the DASH diet and exercise as tolerated.   GERD (gastroesophageal reflux disease) Avoid offending foods, start probiotics. Do not eat large meals in late evening and consider raising head of bed.   Diabetes mellitus type 2 in obese hgba1c acceptable, minimize simple carbs. Increase exercise as tolerated. Continue current meds, improving encouraged to minimize carbs slightly more and recheck in 3 months  Chronic kidney disease stage III (GFR 30-59 ml/min) Stable encouraged adequate hydration  Hyperlipidemia Tolerating statin, encouraged heart healthy diet, avoid trans fats, minimize simple carbs and saturated fats. Increase exercise as tolerated  Tinea pedis of left foot Between 4th and 5th toe, encouraged Lamisil topically to foot 2 x a day  Pain of toe of left foot Suffered a serious crush injury to this foot back in 1965 requiring surgical pinning. Has always suffered with some swelling in this foot as a result but over the past few weeks has been having increased discomfort sensitivity between fourth  and fifth toes and along lateral aspect of left foot c/w gout. Even the light touch of a sheet is uncomfortable. Will check uric acid and encouraged Salon Pas gel bid and if no improvement then refer to podiatry for further consideration

## 2014-03-14 NOTE — Assessment & Plan Note (Signed)
Well controlled, no changes to meds. Encouraged heart healthy diet such as the DASH diet and exercise as tolerated.  °

## 2014-03-14 NOTE — Assessment & Plan Note (Signed)
Tolerating statin, encouraged heart healthy diet, avoid trans fats, minimize simple carbs and saturated fats. Increase exercise as tolerated 

## 2014-03-14 NOTE — Assessment & Plan Note (Signed)
Avoid offending foods, start probiotics. Do not eat large meals in late evening and consider raising head of bed.  

## 2014-03-21 ENCOUNTER — Encounter: Payer: Self-pay | Admitting: Family Medicine

## 2014-03-22 ENCOUNTER — Encounter: Payer: Self-pay | Admitting: Family Medicine

## 2014-03-28 ENCOUNTER — Other Ambulatory Visit: Payer: Self-pay | Admitting: Family Medicine

## 2014-04-21 ENCOUNTER — Other Ambulatory Visit: Payer: Self-pay | Admitting: Family Medicine

## 2014-05-19 ENCOUNTER — Emergency Department (HOSPITAL_COMMUNITY): Payer: Medicare Other

## 2014-05-19 ENCOUNTER — Encounter (HOSPITAL_COMMUNITY): Payer: Self-pay | Admitting: Emergency Medicine

## 2014-05-19 ENCOUNTER — Inpatient Hospital Stay (HOSPITAL_COMMUNITY)
Admission: EM | Admit: 2014-05-19 | Discharge: 2014-05-21 | DRG: 087 | Disposition: A | Discharge status: Home / Self Care | Payer: Medicare Other | Attending: Internal Medicine | Admitting: Internal Medicine

## 2014-05-19 DIAGNOSIS — Z7982 Long term (current) use of aspirin: Secondary | ICD-10-CM

## 2014-05-19 DIAGNOSIS — S0219XA Other fracture of base of skull, initial encounter for closed fracture: Secondary | ICD-10-CM | POA: Diagnosis not present

## 2014-05-19 DIAGNOSIS — S065X0A Traumatic subdural hemorrhage without loss of consciousness, initial encounter: Secondary | ICD-10-CM | POA: Diagnosis not present

## 2014-05-19 DIAGNOSIS — I129 Hypertensive chronic kidney disease with stage 1 through stage 4 chronic kidney disease, or unspecified chronic kidney disease: Secondary | ICD-10-CM | POA: Diagnosis present

## 2014-05-19 DIAGNOSIS — K219 Gastro-esophageal reflux disease without esophagitis: Secondary | ICD-10-CM | POA: Diagnosis present

## 2014-05-19 DIAGNOSIS — S0031XA Abrasion of nose, initial encounter: Secondary | ICD-10-CM | POA: Diagnosis present

## 2014-05-19 DIAGNOSIS — S065XAA Traumatic subdural hemorrhage with loss of consciousness status unknown, initial encounter: Secondary | ICD-10-CM | POA: Diagnosis present

## 2014-05-19 DIAGNOSIS — Z888 Allergy status to other drugs, medicaments and biological substances status: Secondary | ICD-10-CM

## 2014-05-19 DIAGNOSIS — W19XXXA Unspecified fall, initial encounter: Secondary | ICD-10-CM

## 2014-05-19 DIAGNOSIS — N183 Chronic kidney disease, stage 3 unspecified: Secondary | ICD-10-CM | POA: Diagnosis present

## 2014-05-19 DIAGNOSIS — Z887 Allergy status to serum and vaccine status: Secondary | ICD-10-CM

## 2014-05-19 DIAGNOSIS — E119 Type 2 diabetes mellitus without complications: Secondary | ICD-10-CM

## 2014-05-19 DIAGNOSIS — Y92009 Unspecified place in unspecified non-institutional (private) residence as the place of occurrence of the external cause: Secondary | ICD-10-CM

## 2014-05-19 DIAGNOSIS — Z880 Allergy status to penicillin: Secondary | ICD-10-CM | POA: Diagnosis not present

## 2014-05-19 DIAGNOSIS — Z23 Encounter for immunization: Secondary | ICD-10-CM

## 2014-05-19 DIAGNOSIS — Z6831 Body mass index (BMI) 31.0-31.9, adult: Secondary | ICD-10-CM | POA: Diagnosis not present

## 2014-05-19 DIAGNOSIS — E785 Hyperlipidemia, unspecified: Secondary | ICD-10-CM

## 2014-05-19 DIAGNOSIS — Z96669 Presence of unspecified artificial ankle joint: Secondary | ICD-10-CM | POA: Diagnosis not present

## 2014-05-19 DIAGNOSIS — Z79899 Other long term (current) drug therapy: Secondary | ICD-10-CM | POA: Diagnosis not present

## 2014-05-19 DIAGNOSIS — Z86010 Personal history of colonic polyps: Secondary | ICD-10-CM | POA: Diagnosis not present

## 2014-05-19 DIAGNOSIS — S022XXA Fracture of nasal bones, initial encounter for closed fracture: Secondary | ICD-10-CM | POA: Diagnosis not present

## 2014-05-19 DIAGNOSIS — Z9049 Acquired absence of other specified parts of digestive tract: Secondary | ICD-10-CM | POA: Diagnosis present

## 2014-05-19 DIAGNOSIS — W010XXA Fall on same level from slipping, tripping and stumbling without subsequent striking against object, initial encounter: Secondary | ICD-10-CM | POA: Diagnosis not present

## 2014-05-19 DIAGNOSIS — E669 Obesity, unspecified: Secondary | ICD-10-CM | POA: Diagnosis not present

## 2014-05-19 DIAGNOSIS — S065X9A Traumatic subdural hemorrhage with loss of consciousness of unspecified duration, initial encounter: Secondary | ICD-10-CM

## 2014-05-19 DIAGNOSIS — E1169 Type 2 diabetes mellitus with other specified complication: Secondary | ICD-10-CM

## 2014-05-19 DIAGNOSIS — S00211A Abrasion of right eyelid and periocular area, initial encounter: Secondary | ICD-10-CM | POA: Diagnosis present

## 2014-05-19 LAB — URINALYSIS, ROUTINE W REFLEX MICROSCOPIC
Bilirubin Urine: NEGATIVE
Glucose, UA: NEGATIVE mg/dL
HGB URINE DIPSTICK: NEGATIVE
KETONES UR: NEGATIVE mg/dL
Leukocytes, UA: NEGATIVE
Nitrite: NEGATIVE
PROTEIN: NEGATIVE mg/dL
Specific Gravity, Urine: 1.013 (ref 1.005–1.030)
Urobilinogen, UA: 0.2 mg/dL (ref 0.0–1.0)
pH: 5 (ref 5.0–8.0)

## 2014-05-19 LAB — BASIC METABOLIC PANEL
Anion gap: 5 (ref 5–15)
BUN: 19 mg/dL (ref 6–23)
CO2: 22 mmol/L (ref 19–32)
Calcium: 9 mg/dL (ref 8.4–10.5)
Chloride: 108 mmol/L (ref 96–112)
Creatinine, Ser: 1.35 mg/dL (ref 0.50–1.35)
GFR calc Af Amer: 56 mL/min — ABNORMAL LOW (ref >90)
GFR calc non Af Amer: 49 mL/min — ABNORMAL LOW (ref >90)
GLUCOSE: 179 mg/dL — AB (ref 70–99)
POTASSIUM: 4.7 mmol/L (ref 3.5–5.1)
SODIUM: 135 mmol/L (ref 135–145)

## 2014-05-19 LAB — CBC WITH DIFFERENTIAL/PLATELET
Basophils Absolute: 0 10*3/uL (ref 0.0–0.1)
Basophils Relative: 0 % (ref 0–1)
EOS ABS: 0.2 10*3/uL (ref 0.0–0.7)
Eosinophils Relative: 2 % (ref 0–5)
HCT: 44.3 % (ref 39.0–52.0)
Hemoglobin: 15 g/dL (ref 13.0–17.0)
Lymphocytes Relative: 13 % (ref 12–46)
Lymphs Abs: 1.3 10*3/uL (ref 0.7–4.0)
MCH: 28.2 pg (ref 26.0–34.0)
MCHC: 33.9 g/dL (ref 30.0–36.0)
MCV: 83.3 fL (ref 78.0–100.0)
Monocytes Absolute: 0.9 10*3/uL (ref 0.1–1.0)
Monocytes Relative: 9 % (ref 3–12)
NEUTROS PCT: 76 % (ref 43–77)
Neutro Abs: 7.7 10*3/uL (ref 1.7–7.7)
PLATELETS: 184 10*3/uL (ref 150–400)
RBC: 5.32 MIL/uL (ref 4.22–5.81)
RDW: 13.5 % (ref 11.5–15.5)
WBC: 10 10*3/uL (ref 4.0–10.5)

## 2014-05-19 LAB — I-STAT TROPONIN, ED: Troponin i, poc: 0.02 ng/mL (ref 0.00–0.08)

## 2014-05-19 MED ORDER — ONDANSETRON 4 MG PO TBDP
4.0000 mg | ORAL_TABLET | Freq: Once | ORAL | Status: AC
  Administered 2014-05-19: 4 mg via ORAL
  Filled 2014-05-19: qty 1

## 2014-05-19 MED ORDER — HYDROCODONE-ACETAMINOPHEN 5-325 MG PO TABS
1.0000 | ORAL_TABLET | Freq: Once | ORAL | Status: AC
  Administered 2014-05-19: 1 via ORAL
  Filled 2014-05-19: qty 1

## 2014-05-19 MED ORDER — TETANUS-DIPHTH-ACELL PERTUSSIS 5-2.5-18.5 LF-MCG/0.5 IM SUSP
0.5000 mL | Freq: Once | INTRAMUSCULAR | Status: AC
  Administered 2014-05-19: 0.5 mL via INTRAMUSCULAR
  Filled 2014-05-19: qty 0.5

## 2014-05-19 MED ORDER — LIDOCAINE HCL (PF) 1 % IJ SOLN
10.0000 mL | Freq: Once | INTRAMUSCULAR | Status: DC
  Filled 2014-05-19: qty 10

## 2014-05-19 NOTE — ED Notes (Signed)
Patient states that he fell at home, hit his face on the floor.  Patient denies any dizziness before the fall.  He states that he thinks he tripped and then fell.  Patient denies any LOC, full recall but did feel a bit dazed after the fall.  Patient denies any neck or back pain.  Patient placed in c collar in triage.  Patient is CAOx4 at this time.

## 2014-05-19 NOTE — ED Notes (Addendum)
Patient in Xray. Patient has not arrived from [facility]. Received report from [place], [place]

## 2014-05-19 NOTE — ED Provider Notes (Signed)
CSN: [id number]     Arrival date & time 05/19/14  1904 History  This chart was scribed for non-physician practitioner, Margarita Mail PA,  working with Blanchie Dessert, MD, by Eustaquio Maize, ED Scribe. This patient was seen in room TR07C/TR07C and the patient's care was started at 9:03 PM.    Chief Complaint  Patient presents with  . Fall  . Facial Laceration   The history is provided by the patient and a relative. No language interpreter was used.     HPI Comments: Vincent Allen is a 81 y.o. male with history of HTN and HLD who presents to the Emergency Department complaining of facial pain s/p ground level fall that occurred earlier today at approximately 6 pm. He also complains of right shoulder pain. Pt reports that he tripped over a rug and landed on his right side, causing the pain. Pt denies loss of consciousness, dizziness, or headache. Denies any pain with eye movement as well. 2 days ago, pt reports he woke up in a sweat. Reports that he has felt generally weak for the past couple of days as well but does not believe this caused him to fall. He is currently on daily aspirin.    Past Medical History  Diagnosis Date  . Cataract   . GERD (gastroesophageal reflux disease)   . Diabetes mellitus   . Hypertension   . Hyperlipidemia   . Colon polyps   . Chronic kidney disease stage III (GFR 30-59 ml/min) 05/03/2012  . Irregular heartbeat   . HTN (hypertension) 01/13/2013  . Preventative health care 01/17/2013  . Medicare annual wellness visit, subsequent 01/17/2013   Past Surgical History  Procedure Laterality Date  . Cataract extraction    . Rotator cuff repair    . Ankle arthroplasty    . Tonsillectomy    . Cholecystectomy    . Cholecystectomy N/A 05/03/2012    Procedure: LAPAROSCOPIC CHOLECYSTECTOMY WITH INTRAOPERATIVE CHOLANGIOGRAM;  Surgeon: [name], MD;  Location: [facility];  Service: General;  Laterality: N/A;  . Cataract extraction  02/28/14   Family History   Problem Relation Age of Onset  . Cancer Mother     colon, breast  . Heart disease Father   . Stroke Father   . Cancer Paternal Grandmother     colon   History  Substance Use Topics  . Smoking status: Never Smoker   . Smokeless tobacco: Never Used  . Alcohol Use: No    Review of Systems  A complete 10 system review of systems was obtained and all systems are negative except as noted in the HPI and PMH.    Allergies  Penicillins; Verapamil; and Influenza vaccines  Home Medications   Prior to Admission medications   Medication Sig Start Date End Date Taking? Authorizing Provider  amLODipine (NORVASC) 5 MG tablet TAKE 1 TABLET BY MOUTH EVERY DAY 01/10/14   [name], MD  aspirin EC 81 MG tablet Take 81 mg by mouth daily.    Historical Provider, MD  atorvastatin (LIPITOR) 20 MG tablet TAKE 1 TABLET BY MOUTH DAILY 04/21/14   [name], MD  Calcium 600 MG tablet Take 1 tablet (600 mg total) by mouth 2 (two) times daily. 01/30/11   Burnice Logan, MD  cholecalciferol (VITAMIN D) 400 UNITS TABS Take 400 Units by mouth 2 (two) times daily.    Historical Provider, MD  glipiZIDE (GLUCOTROL) 10 MG tablet TAKE 2 TABLETS BY MOUTH TWICE A DAY BEFORE MEALS  03/28/14   [name], MD  Lancets [facility] ULTRASOFT) lancets Use as instructed to check blood sugar twice a day as needed Dx Code 250.00 06/28/11   Burnice Logan, MD  lisinopril (PRINIVIL,ZESTRIL) 5 MG tablet TAKE 1 TABLET BY MOUTH EVERY DAY 03/03/14   [name], MD  metFORMIN (GLUCOPHAGE-XR) 500 MG 24 hr tablet Take 1 tablet (500 mg total) by mouth 2 (two) times daily. 12/10/13   [name], MD  Naproxen Sodium (ALEVE) 220 MG CAPS Take 440 mg by mouth 2 (two) times daily as needed (pain).     Historical Provider, MD  ONE TOUCH ULTRA TEST test strip Use as directed. Test blood sugar twice daily as needed. 01/14/14   [name], MD  sitaGLIPtin (JANUVIA) 100 MG tablet 1/2 to 1 tab po daily 12/06/13   [name],  MD  terbinafine (LAMISIL AT) 1 % cream Apply 1 application topically 2 (two) times daily. Tinea pedis 03/14/14   [name], MD  triamterene-hydrochlorothiazide (MAXZIDE) 75-50 MG per tablet TAKE 1 TABLET BY MOUTH DAILY. 03/02/14   [name], MD   Triage Vitals: BP 167/69 mmHg  Pulse 77  Temp(Src) 97.8 F (36.6 C) (Oral)  Resp 22  Ht 6' (1.829 m)  Wt 225 lb (102.059 kg)  BMI 30.51 kg/m2  SpO2 97%   Physical Exam  Constitutional: He is oriented to person, place, and time. He appears well-developed and well-nourished. No distress.  HENT:  Head: Normocephalic and atraumatic.    Nose: No nasal septal hematoma.  Multiple facial abrasions over the nose and right brow. There is a small 2 cm hematoma on the medial right brow. No pain with eye movement Tenderness to palpation of the facial bones. No step-off or crepitus noted  Eyes: Conjunctivae and EOM are normal. Pupils are equal, round, and reactive to light.  Neck: Neck supple. No tracheal deviation present.  Cardiovascular: Normal rate.   Pulmonary/Chest: Effort normal. No respiratory distress.  Abdominal: Soft. He exhibits no distension.  Musculoskeletal: Normal range of motion. He exhibits no edema.       Right shoulder: He exhibits normal range of motion and no bony tenderness.       Arms: Right shoulder muscular atrophy noted. There is an abrasion over the distal clavicle. No tenderness to palpation or deformity noted. Skin atrophy present.  Neurological: He is alert and oriented to person, place, and time.  Speech is clear and goal oriented, follows commands Major Cranial nerves without deficit, no facial droop Normal strength in upper and lower extremities bilaterally including dorsiflexion and plantar flexion, strong and equal grip strength Sensation normal to light and sharp touch Moves extremities without ataxia, coordination intact Normal finger to nose and rapid alternating movements Neg romberg, no pronator  drift Normal gait Normal heel-shin and balance   Skin: Skin is warm and dry.  Psychiatric: He has a normal mood and affect. His behavior is normal.  Nursing note and vitals reviewed.   ED Course  Procedures (including critical care time)  DIAGNOSTIC STUDIES: Oxygen Saturation is 97% on RA, normal by my interpretation.    COORDINATION OF CARE: 9:08 PM-Discussed treatment plan which includes CT Head; Maxillofacial; and C-Spine as well as consult with neurosurgeon on call with pt at bedside and pt agreed to plan.   Labs Review Labs Reviewed  CBC WITH DIFFERENTIAL/PLATELET  BASIC METABOLIC PANEL  URINALYSIS, ROUTINE W REFLEX MICROSCOPIC    Imaging Review Ct Head Wo  Contrast  05/19/2014   CLINICAL DATA:  Tripped and fell, with abrasions about the face. Question of loss of consciousness. Patient feeling dazed. Concern for maxillofacial or cervical spine injury. Initial encounter.  EXAM: CT HEAD WITHOUT CONTRAST  CT MAXILLOFACIAL WITHOUT CONTRAST  CT CERVICAL SPINE WITHOUT CONTRAST  TECHNIQUE: Multidetector CT imaging of the head, cervical spine, and maxillofacial structures were performed using the standard protocol without intravenous contrast. Multiplanar CT image reconstructions of the cervical spine and maxillofacial structures were also generated.  COMPARISON:  None.  FINDINGS: CT HEAD FINDINGS  A small amount of acute subdural hemorrhage is noted along the left side of the anterior falx cerebri, measuring up to 3 mm in thickness.  Prominence of the ventricles and sulci reflects mild to moderate cortical volume loss. Mild cerebellar atrophy is noted. Scattered periventricular and subcortical white matter change likely reflects small vessel ischemic microangiopathy. Small chronic lacunar infarcts are seen in the cerebellar hemispheres bilaterally.  The brainstem and fourth ventricle are within normal limits. The basal ganglia are unremarkable in appearance. The cerebral hemispheres  demonstrate grossly normal gray-white differentiation. No mass effect or midline shift is seen.  There is a slightly comminuted fracture of the left side of the nasal bone, and also a minimally comminuted fracture through the outer and inner tables of the left frontal sinus, with blood partially filling the left frontal sinus. The orbits are within normal limits. The remaining paranasal sinuses and mastoid air cells are well-aerated. Mild soft tissue swelling is noted overlying the right frontal calvarium.  CT MAXILLOFACIAL FINDINGS  There is a minimally comminuted fracture involving the left-sided nasal bone, with mild displacement. There are also minimally displaced fractures through the inner and outer tables of the left frontal sinus. The maxilla and mandible appear intact. There is chronic absence of the dentition.  The orbits are intact bilaterally. Blood is noted partially filling the left frontal sinus. Visualized paranasal sinuses and mastoid air cells are well-aerated.  Mild soft tissue swelling is noted overlying the right frontal calvarium. Soft tissue swelling is noted overlying the nose, with scattered soft tissue air on the left side of the nose. The parapharyngeal fat planes are preserved. The nasopharynx, oropharynx and hypopharynx are unremarkable in appearance. The visualized portions of the valleculae and piriform sinuses are grossly unremarkable.  The parotid and submandibular glands are within normal limits. No cervical lymphadenopathy is seen.  CT CERVICAL SPINE FINDINGS  There is no evidence of acute fracture or subluxation. There is grade 1 anterolisthesis of C2 on C3, reflecting underlying facet disease. Multilevel disc space narrowing is noted along the cervical spine, with scattered anterior and posterior disc osteophyte complexes. Vertebral bodies demonstrate normal height. Prevertebral soft tissues are within normal limits.  The visualized portions of the thyroid gland are unremarkable  in appearance. Mild calcification is seen at the carotid bifurcations bilaterally.  IMPRESSION: 1. Small amount of acute subdural hemorrhage along the left side of the anterior falx cerebri, measuring up to 3 mm in thickness. 2. Slightly comminuted fracture of the left side of the nasal bone, with mild soft tissue swelling and mild soft tissue air. 3. Minimally comminuted fracture through the outer and inner tables of the left frontal sinus, with blood partially filling the left frontal sinus. 4. Mild soft tissue swelling overlying the right frontal calvarium. 5. No evidence of acute fracture or subluxation along the cervical spine. 6. Mild to moderate cortical volume loss and scattered small vessel ischemic microangiopathy. Small chronic lacunar  infarcts in the cerebellar hemispheres bilaterally. 7. Grade 1 anterolisthesis of C2 on C3, reflecting underlying facet disease. 8. Mild degenerative change along the cervical spine. 9. Mild calcification at the carotid bifurcations bilaterally.  Critical Value/emergent results were called by telephone at the time of interpretation on 05/19/2014 at 8:53 pm to [facility] PA, who verbally acknowledged these results.   Electronically Signed   By: [name] M.D.   On: 05/19/2014 20:54   Ct Cervical Spine Wo Contrast  05/19/2014   CLINICAL DATA:  Tripped and fell, with abrasions about the face. Question of loss of consciousness. Patient feeling dazed. Concern for maxillofacial or cervical spine injury. Initial encounter.  EXAM: CT HEAD WITHOUT CONTRAST  CT MAXILLOFACIAL WITHOUT CONTRAST  CT CERVICAL SPINE WITHOUT CONTRAST  TECHNIQUE: Multidetector CT imaging of the head, cervical spine, and maxillofacial structures were performed using the standard protocol without intravenous contrast. Multiplanar CT image reconstructions of the cervical spine and maxillofacial structures were also generated.  COMPARISON:  None.  FINDINGS: CT HEAD FINDINGS  A small amount of acute  subdural hemorrhage is noted along the left side of the anterior falx cerebri, measuring up to 3 mm in thickness.  Prominence of the ventricles and sulci reflects mild to moderate cortical volume loss. Mild cerebellar atrophy is noted. Scattered periventricular and subcortical white matter change likely reflects small vessel ischemic microangiopathy. Small chronic lacunar infarcts are seen in the cerebellar hemispheres bilaterally.  The brainstem and fourth ventricle are within normal limits. The basal ganglia are unremarkable in appearance. The cerebral hemispheres demonstrate grossly normal gray-white differentiation. No mass effect or midline shift is seen.  There is a slightly comminuted fracture of the left side of the nasal bone, and also a minimally comminuted fracture through the outer and inner tables of the left frontal sinus, with blood partially filling the left frontal sinus. The orbits are within normal limits. The remaining paranasal sinuses and mastoid air cells are well-aerated. Mild soft tissue swelling is noted overlying the right frontal calvarium.  CT MAXILLOFACIAL FINDINGS  There is a minimally comminuted fracture involving the left-sided nasal bone, with mild displacement. There are also minimally displaced fractures through the inner and outer tables of the left frontal sinus. The maxilla and mandible appear intact. There is chronic absence of the dentition.  The orbits are intact bilaterally. Blood is noted partially filling the left frontal sinus. Visualized paranasal sinuses and mastoid air cells are well-aerated.  Mild soft tissue swelling is noted overlying the right frontal calvarium. Soft tissue swelling is noted overlying the nose, with scattered soft tissue air on the left side of the nose. The parapharyngeal fat planes are preserved. The nasopharynx, oropharynx and hypopharynx are unremarkable in appearance. The visualized portions of the valleculae and piriform sinuses are grossly  unremarkable.  The parotid and submandibular glands are within normal limits. No cervical lymphadenopathy is seen.  CT CERVICAL SPINE FINDINGS  There is no evidence of acute fracture or subluxation. There is grade 1 anterolisthesis of C2 on C3, reflecting underlying facet disease. Multilevel disc space narrowing is noted along the cervical spine, with scattered anterior and posterior disc osteophyte complexes. Vertebral bodies demonstrate normal height. Prevertebral soft tissues are within normal limits.  The visualized portions of the thyroid gland are unremarkable in appearance. Mild calcification is seen at the carotid bifurcations bilaterally.  IMPRESSION: 1. Small amount of acute subdural hemorrhage along the left side of the anterior falx cerebri, measuring up to 3 mm in thickness.  2. Slightly comminuted fracture of the left side of the nasal bone, with mild soft tissue swelling and mild soft tissue air. 3. Minimally comminuted fracture through the outer and inner tables of the left frontal sinus, with blood partially filling the left frontal sinus. 4. Mild soft tissue swelling overlying the right frontal calvarium. 5. No evidence of acute fracture or subluxation along the cervical spine. 6. Mild to moderate cortical volume loss and scattered small vessel ischemic microangiopathy. Small chronic lacunar infarcts in the cerebellar hemispheres bilaterally. 7. Grade 1 anterolisthesis of C2 on C3, reflecting underlying facet disease. 8. Mild degenerative change along the cervical spine. 9. Mild calcification at the carotid bifurcations bilaterally.  Critical Value/emergent results were called by telephone at the time of interpretation on 05/19/2014 at 8:53 pm to [facility] PA, who verbally acknowledged these results.   Electronically Signed   By: [name] M.D.   On: 05/19/2014 20:54   Ct Maxillofacial Wo Cm  05/19/2014   CLINICAL DATA:  Tripped and fell, with abrasions about the face. Question of loss of  consciousness. Patient feeling dazed. Concern for maxillofacial or cervical spine injury. Initial encounter.  EXAM: CT HEAD WITHOUT CONTRAST  CT MAXILLOFACIAL WITHOUT CONTRAST  CT CERVICAL SPINE WITHOUT CONTRAST  TECHNIQUE: Multidetector CT imaging of the head, cervical spine, and maxillofacial structures were performed using the standard protocol without intravenous contrast. Multiplanar CT image reconstructions of the cervical spine and maxillofacial structures were also generated.  COMPARISON:  None.  FINDINGS: CT HEAD FINDINGS  A small amount of acute subdural hemorrhage is noted along the left side of the anterior falx cerebri, measuring up to 3 mm in thickness.  Prominence of the ventricles and sulci reflects mild to moderate cortical volume loss. Mild cerebellar atrophy is noted. Scattered periventricular and subcortical white matter change likely reflects small vessel ischemic microangiopathy. Small chronic lacunar infarcts are seen in the cerebellar hemispheres bilaterally.  The brainstem and fourth ventricle are within normal limits. The basal ganglia are unremarkable in appearance. The cerebral hemispheres demonstrate grossly normal gray-white differentiation. No mass effect or midline shift is seen.  There is a slightly comminuted fracture of the left side of the nasal bone, and also a minimally comminuted fracture through the outer and inner tables of the left frontal sinus, with blood partially filling the left frontal sinus. The orbits are within normal limits. The remaining paranasal sinuses and mastoid air cells are well-aerated. Mild soft tissue swelling is noted overlying the right frontal calvarium.  CT MAXILLOFACIAL FINDINGS  There is a minimally comminuted fracture involving the left-sided nasal bone, with mild displacement. There are also minimally displaced fractures through the inner and outer tables of the left frontal sinus. The maxilla and mandible appear intact. There is chronic absence  of the dentition.  The orbits are intact bilaterally. Blood is noted partially filling the left frontal sinus. Visualized paranasal sinuses and mastoid air cells are well-aerated.  Mild soft tissue swelling is noted overlying the right frontal calvarium. Soft tissue swelling is noted overlying the nose, with scattered soft tissue air on the left side of the nose. The parapharyngeal fat planes are preserved. The nasopharynx, oropharynx and hypopharynx are unremarkable in appearance. The visualized portions of the valleculae and piriform sinuses are grossly unremarkable.  The parotid and submandibular glands are within normal limits. No cervical lymphadenopathy is seen.  CT CERVICAL SPINE FINDINGS  There is no evidence of acute fracture or subluxation. There is grade 1 anterolisthesis of C2  on C3, reflecting underlying facet disease. Multilevel disc space narrowing is noted along the cervical spine, with scattered anterior and posterior disc osteophyte complexes. Vertebral bodies demonstrate normal height. Prevertebral soft tissues are within normal limits.  The visualized portions of the thyroid gland are unremarkable in appearance. Mild calcification is seen at the carotid bifurcations bilaterally.  IMPRESSION: 1. Small amount of acute subdural hemorrhage along the left side of the anterior falx cerebri, measuring up to 3 mm in thickness. 2. Slightly comminuted fracture of the left side of the nasal bone, with mild soft tissue swelling and mild soft tissue air. 3. Minimally comminuted fracture through the outer and inner tables of the left frontal sinus, with blood partially filling the left frontal sinus. 4. Mild soft tissue swelling overlying the right frontal calvarium. 5. No evidence of acute fracture or subluxation along the cervical spine. 6. Mild to moderate cortical volume loss and scattered small vessel ischemic microangiopathy. Small chronic lacunar infarcts in the cerebellar hemispheres bilaterally. 7.  Grade 1 anterolisthesis of C2 on C3, reflecting underlying facet disease. 8. Mild degenerative change along the cervical spine. 9. Mild calcification at the carotid bifurcations bilaterally.  Critical Value/emergent results were called by telephone at the time of interpretation on 05/19/2014 at 8:53 pm to [facility] PA, who verbally acknowledged these results.   Electronically Signed   By: [name] M.D.   On: 05/19/2014 20:54     EKG Interpretation   Date/Time:  Thursday May 19 2014 21:26:36 EST Ventricular Rate:  61 PR Interval:  248 QRS Duration: 132 QT Interval:  426 QTC Calculation: 428 R Axis:     Text Interpretation:  Sinus rhythm with 1st degree A-V block Non-specific  intra-ventricular conduction block Cannot rule out Septal infarct , age  undetermined No significant change since last tracing Confirmed by  [facility]  MD, WHITNEY ([id number]) on 05/19/2014 9:53:05 PM      MDM   Final diagnoses:  Fall    9:22 PM BP 167/69 mmHg  Pulse 77  Temp(Src) 97.8 F (36.6 C) (Oral)  Resp 22  Ht 6' (1.829 m)  Wt 225 lb (102.059 kg)  BMI 30.51 kg/m2  SpO2 97% Patient with a small 3 mm acute subdural hemorrhage along the anterior falx cerebri. Patient also has a comminuted fracture of the left nasal bone as well as a minimally comminuted fracture of the outer and inner table of the left frontal sinus. The patient declines pain medication at this time. He is also expressing having several days of malaise. Patient also had an episode of diaphoresis 2 days ago. Labs are currently pending. I placed call to neurosurgery.  I spoke with Dr. Joya Salm who suggests inpatient admission for observation. If the patient does not have anyone at home. The patient will need admission. I have also spoken with Dr. Constance Holster, who is on-call for facial trauma. No interventions are necessary at this time. Dr. Constance Holster will consult on the patient in the morning.  I discussed the patient's workup with Dr. Humphrey Rolls. He  will admit the patient for observation tonight. He appears safe for admission. No neurologic deficits noted.   I personally performed the services described in this documentation, which was scribed in my presence. The recorded information has been reviewed and is accurate.        Margarita Mail, PA-C 05/20/14 0130  Blanchie Dessert, MD 05/20/14 2248

## 2014-05-19 NOTE — H&P (Signed)
[facility] History and Physical  POL HODO [id number] DOB: 11-May-1933 DOA: 05/19/2014  Referring physician: Margarita Mail, PA PCP: [name], MD   Chief Complaint: Fall  HPI: Vincent Allen is a 81 y.o. male presents after a fall. Patient states he was walking in his hallway and tripped over his rug. Patient fell on his face and right side. He states he started to have a nosebleed. He states he was a little dazed but did not lose consciousness. Patient states that he called his son to get him. In the ED he had a evaluation with CT head and he was noted to have a subdural hematoma as well as nasal fractures. Neurosurgery was called and they suggested observation over night. He had been having a headache and it is better. No other complaints,   Review of Systems:  Complete 12 point ROS was negative other than that in the HPI  Past Medical History  Diagnosis Date  . Cataract   . GERD (gastroesophageal reflux disease)   . Diabetes mellitus   . Hypertension   . Hyperlipidemia   . Colon polyps   . Chronic kidney disease stage III (GFR 30-59 ml/min) 05/03/2012  . Irregular heartbeat   . HTN (hypertension) 01/13/2013  . Preventative health care 01/17/2013  . Medicare annual wellness visit, subsequent 01/17/2013   Past Surgical History  Procedure Laterality Date  . Cataract extraction    . Rotator cuff repair    . Ankle arthroplasty    . Tonsillectomy    . Cholecystectomy    . Cholecystectomy N/A 05/03/2012    Procedure: LAPAROSCOPIC CHOLECYSTECTOMY WITH INTRAOPERATIVE CHOLANGIOGRAM;  Surgeon: [name], MD;  Location: [facility];  Service: General;  Laterality: N/A;  . Cataract extraction  02/28/14   Social History:  reports that he has never smoked. He has never used smokeless tobacco. He reports that he does not drink alcohol or use illicit drugs.  Allergies  Allergen Reactions  . Penicillins Hives and Itching  . Verapamil     Junctional rhythm  .  Influenza Vaccines Rash    Family History  Problem Relation Age of Onset  . Cancer Mother     colon, breast  . Heart disease Father   . Stroke Father   . Cancer Paternal Grandmother     colon     Prior to Admission medications   Medication Sig Start Date End Date Taking? Authorizing Provider  amLODipine (NORVASC) 5 MG tablet TAKE 1 TABLET BY MOUTH EVERY DAY 01/10/14   [name], MD  aspirin EC 81 MG tablet Take 81 mg by mouth daily.    Historical Provider, MD  atorvastatin (LIPITOR) 20 MG tablet TAKE 1 TABLET BY MOUTH DAILY 04/21/14   [name], MD  Calcium 600 MG tablet Take 1 tablet (600 mg total) by mouth 2 (two) times daily. 01/30/11   Burnice Logan, MD  cholecalciferol (VITAMIN D) 400 UNITS TABS Take 400 Units by mouth 2 (two) times daily.    Historical Provider, MD  glipiZIDE (GLUCOTROL) 10 MG tablet TAKE 2 TABLETS BY MOUTH TWICE A DAY BEFORE MEALS 03/28/14   [name], MD  Lancets [facility] ULTRASOFT) lancets Use as instructed to check blood sugar twice a day as needed Dx Code 250.00 06/28/11   Burnice Logan, MD  lisinopril (PRINIVIL,ZESTRIL) 5 MG tablet TAKE 1 TABLET BY MOUTH EVERY DAY 03/03/14   [name], MD  metFORMIN (GLUCOPHAGE-XR) 500 MG 24 hr tablet  Take 1 tablet (500 mg total) by mouth 2 (two) times daily. 12/10/13   [name], MD  Naproxen Sodium (ALEVE) 220 MG CAPS Take 440 mg by mouth 2 (two) times daily as needed (pain).     Historical Provider, MD  ONE TOUCH ULTRA TEST test strip Use as directed. Test blood sugar twice daily as needed. 01/14/14   [name], MD  sitaGLIPtin (JANUVIA) 100 MG tablet 1/2 to 1 tab po daily 12/06/13   [name], MD  terbinafine (LAMISIL AT) 1 % cream Apply 1 application topically 2 (two) times daily. Tinea pedis 03/14/14   [name], MD  triamterene-hydrochlorothiazide (MAXZIDE) 75-50 MG per tablet TAKE 1 TABLET BY MOUTH DAILY. 03/02/14   [name], MD   Physical Exam: Filed Vitals:   05/19/14  2217 05/19/14 2222 05/19/14 2230 05/19/14 2245  BP:   170/62 163/62  Pulse: 60  58 58  Temp:  97.4 F (36.3 C)    TempSrc:      Resp: 15  14 12   Height:      Weight:      SpO2: 100%  98% 99%    Wt Readings from Last 3 Encounters:  05/19/14 102.059 kg (225 lb)  03/14/14 104.962 kg (231 lb 6.4 oz)  12/06/13 105.688 kg (233 lb)    General:  Appears calm and comfortable Eyes: PERRL, normal lids, irises & conjunctiva ENT: grossly normal hearing, lips & tongue Neck: no LAD, masses or thyromegaly Cardiovascular: RRR, no m/r/g. No LE edema. Respiratory: CTA bilaterally, no w/r/r. Normal respiratory effort. Abdomen: soft, ntnd Skin: no rash or induration seen on limited exam Musculoskeletal: grossly normal tone BUE/BLE Psychiatric: grossly normal mood and affect, speech fluent and appropriate Neurologic: grossly non-focal.          Labs on Admission:  Basic Metabolic Panel:  Recent Labs Lab 05/19/14 2125  NA 135  K 4.7  CL 108  CO2 22  GLUCOSE 179*  BUN 19  CREATININE 1.35  CALCIUM 9.0   Liver Function Tests: No results for input(s): AST, ALT, ALKPHOS, BILITOT, PROT, ALBUMIN in the last 168 hours. No results for input(s): LIPASE, AMYLASE in the last 168 hours. No results for input(s): AMMONIA in the last 168 hours. CBC:  Recent Labs Lab 05/19/14 2125  WBC 10.0  NEUTROABS 7.7  HGB 15.0  HCT 44.3  MCV 83.3  PLT 184   Cardiac Enzymes: No results for input(s): CKTOTAL, CKMB, CKMBINDEX, TROPONINI in the last 168 hours.  BNP (last 3 results) No results for input(s): BNP in the last 8760 hours.  ProBNP (last 3 results) No results for input(s): PROBNP in the last 8760 hours.  CBG: No results for input(s): GLUCAP in the last 168 hours.  Radiological Exams on Admission: Dg Shoulder Right  05/19/2014   CLINICAL DATA:  Fall today with shoulder pain, initial encounter  EXAM: RIGHT SHOULDER - 2+ VIEW  COMPARISON:  05/06/2005  FINDINGS: Postsurgical changes are  noted. Degenerative changes of the glenohumeral articulation as well as the acromioclavicular joint are seen. Humeral head is high-riding which may be indicative of a chronic rotator cuff injury. No acute fracture is seen. The underlying bony thorax is within normal limits.  IMPRESSION: Chronic and postsurgical changes.  No acute abnormality is noted.   Electronically Signed   By: Inez Catalina M.D.   On: 05/19/2014 22:03   Ct Head Wo Contrast  05/19/2014   CLINICAL DATA:  Tripped and fell, with  abrasions about the face. Question of loss of consciousness. Patient feeling dazed. Concern for maxillofacial or cervical spine injury. Initial encounter.  EXAM: CT HEAD WITHOUT CONTRAST  CT MAXILLOFACIAL WITHOUT CONTRAST  CT CERVICAL SPINE WITHOUT CONTRAST  TECHNIQUE: Multidetector CT imaging of the head, cervical spine, and maxillofacial structures were performed using the standard protocol without intravenous contrast. Multiplanar CT image reconstructions of the cervical spine and maxillofacial structures were also generated.  COMPARISON:  None.  FINDINGS: CT HEAD FINDINGS  A small amount of acute subdural hemorrhage is noted along the left side of the anterior falx cerebri, measuring up to 3 mm in thickness.  Prominence of the ventricles and sulci reflects mild to moderate cortical volume loss. Mild cerebellar atrophy is noted. Scattered periventricular and subcortical white matter change likely reflects small vessel ischemic microangiopathy. Small chronic lacunar infarcts are seen in the cerebellar hemispheres bilaterally.  The brainstem and fourth ventricle are within normal limits. The basal ganglia are unremarkable in appearance. The cerebral hemispheres demonstrate grossly normal gray-white differentiation. No mass effect or midline shift is seen.  There is a slightly comminuted fracture of the left side of the nasal bone, and also a minimally comminuted fracture through the outer and inner tables of the left  frontal sinus, with blood partially filling the left frontal sinus. The orbits are within normal limits. The remaining paranasal sinuses and mastoid air cells are well-aerated. Mild soft tissue swelling is noted overlying the right frontal calvarium.  CT MAXILLOFACIAL FINDINGS  There is a minimally comminuted fracture involving the left-sided nasal bone, with mild displacement. There are also minimally displaced fractures through the inner and outer tables of the left frontal sinus. The maxilla and mandible appear intact. There is chronic absence of the dentition.  The orbits are intact bilaterally. Blood is noted partially filling the left frontal sinus. Visualized paranasal sinuses and mastoid air cells are well-aerated.  Mild soft tissue swelling is noted overlying the right frontal calvarium. Soft tissue swelling is noted overlying the nose, with scattered soft tissue air on the left side of the nose. The parapharyngeal fat planes are preserved. The nasopharynx, oropharynx and hypopharynx are unremarkable in appearance. The visualized portions of the valleculae and piriform sinuses are grossly unremarkable.  The parotid and submandibular glands are within normal limits. No cervical lymphadenopathy is seen.  CT CERVICAL SPINE FINDINGS  There is no evidence of acute fracture or subluxation. There is grade 1 anterolisthesis of C2 on C3, reflecting underlying facet disease. Multilevel disc space narrowing is noted along the cervical spine, with scattered anterior and posterior disc osteophyte complexes. Vertebral bodies demonstrate normal height. Prevertebral soft tissues are within normal limits.  The visualized portions of the thyroid gland are unremarkable in appearance. Mild calcification is seen at the carotid bifurcations bilaterally.  IMPRESSION: 1. Small amount of acute subdural hemorrhage along the left side of the anterior falx cerebri, measuring up to 3 mm in thickness. 2. Slightly comminuted fracture of  the left side of the nasal bone, with mild soft tissue swelling and mild soft tissue air. 3. Minimally comminuted fracture through the outer and inner tables of the left frontal sinus, with blood partially filling the left frontal sinus. 4. Mild soft tissue swelling overlying the right frontal calvarium. 5. No evidence of acute fracture or subluxation along the cervical spine. 6. Mild to moderate cortical volume loss and scattered small vessel ischemic microangiopathy. Small chronic lacunar infarcts in the cerebellar hemispheres bilaterally. 7. Grade 1 anterolisthesis of C2  on C3, reflecting underlying facet disease. 8. Mild degenerative change along the cervical spine. 9. Mild calcification at the carotid bifurcations bilaterally.  Critical Value/emergent results were called by telephone at the time of interpretation on 05/19/2014 at 8:53 pm to [facility] PA, who verbally acknowledged these results.   Electronically Signed   By: [name] M.D.   On: 05/19/2014 20:54   Ct Cervical Spine Wo Contrast  05/19/2014   CLINICAL DATA:  Tripped and fell, with abrasions about the face. Question of loss of consciousness. Patient feeling dazed. Concern for maxillofacial or cervical spine injury. Initial encounter.  EXAM: CT HEAD WITHOUT CONTRAST  CT MAXILLOFACIAL WITHOUT CONTRAST  CT CERVICAL SPINE WITHOUT CONTRAST  TECHNIQUE: Multidetector CT imaging of the head, cervical spine, and maxillofacial structures were performed using the standard protocol without intravenous contrast. Multiplanar CT image reconstructions of the cervical spine and maxillofacial structures were also generated.  COMPARISON:  None.  FINDINGS: CT HEAD FINDINGS  A small amount of acute subdural hemorrhage is noted along the left side of the anterior falx cerebri, measuring up to 3 mm in thickness.  Prominence of the ventricles and sulci reflects mild to moderate cortical volume loss. Mild cerebellar atrophy is noted. Scattered periventricular and  subcortical white matter change likely reflects small vessel ischemic microangiopathy. Small chronic lacunar infarcts are seen in the cerebellar hemispheres bilaterally.  The brainstem and fourth ventricle are within normal limits. The basal ganglia are unremarkable in appearance. The cerebral hemispheres demonstrate grossly normal gray-white differentiation. No mass effect or midline shift is seen.  There is a slightly comminuted fracture of the left side of the nasal bone, and also a minimally comminuted fracture through the outer and inner tables of the left frontal sinus, with blood partially filling the left frontal sinus. The orbits are within normal limits. The remaining paranasal sinuses and mastoid air cells are well-aerated. Mild soft tissue swelling is noted overlying the right frontal calvarium.  CT MAXILLOFACIAL FINDINGS  There is a minimally comminuted fracture involving the left-sided nasal bone, with mild displacement. There are also minimally displaced fractures through the inner and outer tables of the left frontal sinus. The maxilla and mandible appear intact. There is chronic absence of the dentition.  The orbits are intact bilaterally. Blood is noted partially filling the left frontal sinus. Visualized paranasal sinuses and mastoid air cells are well-aerated.  Mild soft tissue swelling is noted overlying the right frontal calvarium. Soft tissue swelling is noted overlying the nose, with scattered soft tissue air on the left side of the nose. The parapharyngeal fat planes are preserved. The nasopharynx, oropharynx and hypopharynx are unremarkable in appearance. The visualized portions of the valleculae and piriform sinuses are grossly unremarkable.  The parotid and submandibular glands are within normal limits. No cervical lymphadenopathy is seen.  CT CERVICAL SPINE FINDINGS  There is no evidence of acute fracture or subluxation. There is grade 1 anterolisthesis of C2 on C3, reflecting underlying  facet disease. Multilevel disc space narrowing is noted along the cervical spine, with scattered anterior and posterior disc osteophyte complexes. Vertebral bodies demonstrate normal height. Prevertebral soft tissues are within normal limits.  The visualized portions of the thyroid gland are unremarkable in appearance. Mild calcification is seen at the carotid bifurcations bilaterally.  IMPRESSION: 1. Small amount of acute subdural hemorrhage along the left side of the anterior falx cerebri, measuring up to 3 mm in thickness. 2. Slightly comminuted fracture of the left side of the nasal bone,  with mild soft tissue swelling and mild soft tissue air. 3. Minimally comminuted fracture through the outer and inner tables of the left frontal sinus, with blood partially filling the left frontal sinus. 4. Mild soft tissue swelling overlying the right frontal calvarium. 5. No evidence of acute fracture or subluxation along the cervical spine. 6. Mild to moderate cortical volume loss and scattered small vessel ischemic microangiopathy. Small chronic lacunar infarcts in the cerebellar hemispheres bilaterally. 7. Grade 1 anterolisthesis of C2 on C3, reflecting underlying facet disease. 8. Mild degenerative change along the cervical spine. 9. Mild calcification at the carotid bifurcations bilaterally.  Critical Value/emergent results were called by telephone at the time of interpretation on 05/19/2014 at 8:53 pm to [facility] PA, who verbally acknowledged these results.   Electronically Signed   By: [name] M.D.   On: 05/19/2014 20:54   Ct Maxillofacial Wo Cm  05/19/2014   CLINICAL DATA:  Tripped and fell, with abrasions about the face. Question of loss of consciousness. Patient feeling dazed. Concern for maxillofacial or cervical spine injury. Initial encounter.  EXAM: CT HEAD WITHOUT CONTRAST  CT MAXILLOFACIAL WITHOUT CONTRAST  CT CERVICAL SPINE WITHOUT CONTRAST  TECHNIQUE: Multidetector CT imaging of the head,  cervical spine, and maxillofacial structures were performed using the standard protocol without intravenous contrast. Multiplanar CT image reconstructions of the cervical spine and maxillofacial structures were also generated.  COMPARISON:  None.  FINDINGS: CT HEAD FINDINGS  A small amount of acute subdural hemorrhage is noted along the left side of the anterior falx cerebri, measuring up to 3 mm in thickness.  Prominence of the ventricles and sulci reflects mild to moderate cortical volume loss. Mild cerebellar atrophy is noted. Scattered periventricular and subcortical white matter change likely reflects small vessel ischemic microangiopathy. Small chronic lacunar infarcts are seen in the cerebellar hemispheres bilaterally.  The brainstem and fourth ventricle are within normal limits. The basal ganglia are unremarkable in appearance. The cerebral hemispheres demonstrate grossly normal gray-white differentiation. No mass effect or midline shift is seen.  There is a slightly comminuted fracture of the left side of the nasal bone, and also a minimally comminuted fracture through the outer and inner tables of the left frontal sinus, with blood partially filling the left frontal sinus. The orbits are within normal limits. The remaining paranasal sinuses and mastoid air cells are well-aerated. Mild soft tissue swelling is noted overlying the right frontal calvarium.  CT MAXILLOFACIAL FINDINGS  There is a minimally comminuted fracture involving the left-sided nasal bone, with mild displacement. There are also minimally displaced fractures through the inner and outer tables of the left frontal sinus. The maxilla and mandible appear intact. There is chronic absence of the dentition.  The orbits are intact bilaterally. Blood is noted partially filling the left frontal sinus. Visualized paranasal sinuses and mastoid air cells are well-aerated.  Mild soft tissue swelling is noted overlying the right frontal calvarium. Soft  tissue swelling is noted overlying the nose, with scattered soft tissue air on the left side of the nose. The parapharyngeal fat planes are preserved. The nasopharynx, oropharynx and hypopharynx are unremarkable in appearance. The visualized portions of the valleculae and piriform sinuses are grossly unremarkable.  The parotid and submandibular glands are within normal limits. No cervical lymphadenopathy is seen.  CT CERVICAL SPINE FINDINGS  There is no evidence of acute fracture or subluxation. There is grade 1 anterolisthesis of C2 on C3, reflecting underlying facet disease. Multilevel disc space narrowing is noted  along the cervical spine, with scattered anterior and posterior disc osteophyte complexes. Vertebral bodies demonstrate normal height. Prevertebral soft tissues are within normal limits.  The visualized portions of the thyroid gland are unremarkable in appearance. Mild calcification is seen at the carotid bifurcations bilaterally.  IMPRESSION: 1. Small amount of acute subdural hemorrhage along the left side of the anterior falx cerebri, measuring up to 3 mm in thickness. 2. Slightly comminuted fracture of the left side of the nasal bone, with mild soft tissue swelling and mild soft tissue air. 3. Minimally comminuted fracture through the outer and inner tables of the left frontal sinus, with blood partially filling the left frontal sinus. 4. Mild soft tissue swelling overlying the right frontal calvarium. 5. No evidence of acute fracture or subluxation along the cervical spine. 6. Mild to moderate cortical volume loss and scattered small vessel ischemic microangiopathy. Small chronic lacunar infarcts in the cerebellar hemispheres bilaterally. 7. Grade 1 anterolisthesis of C2 on C3, reflecting underlying facet disease. 8. Mild degenerative change along the cervical spine. 9. Mild calcification at the carotid bifurcations bilaterally.  Critical Value/emergent results were called by telephone at the time  of interpretation on 05/19/2014 at 8:53 pm to [facility] PA, who verbally acknowledged these results.   Electronically Signed   By: [name] M.D.   On: 05/19/2014 20:54      Assessment/Plan Active Problems:   Hyperlipidemia   Chronic kidney disease stage III (GFR 30-59 ml/min)   Diabetes mellitus type 2 in obese   Fracture closed, nasal bone   Subdural hematoma   Nasal fracture   1. Fall -mechanical fall -will monitor blood pressures -currently non-surgical injuries as noted  2. Diabetes Mellitus Type 2 -will monitor FSBS -will check A1C -continue with home medications  3. Subdural Hematoma -neurosurgery to see in am -neurochecks ordered  4. Hyperlipidemia -on statins  5. CKD III -monitor labs -at baseline   Code Status: Full Code (must indicate code status--if unknown or must be presumed, indicate so) DVT Prophylaxis:SCD Family Communication: Son (indicate person spoken with, if applicable, with phone number if by telephone) Disposition Plan: Home (indicate anticipated LOS)  Time spent: 52min  [place]ya Alia A [facility] Pager [phone]

## 2014-05-19 NOTE — ED Notes (Signed)
Admitting at bedside 

## 2014-05-20 DIAGNOSIS — Y92009 Unspecified place in unspecified non-institutional (private) residence as the place of occurrence of the external cause: Secondary | ICD-10-CM | POA: Diagnosis not present

## 2014-05-20 DIAGNOSIS — W010XXA Fall on same level from slipping, tripping and stumbling without subsequent striking against object, initial encounter: Secondary | ICD-10-CM | POA: Diagnosis present

## 2014-05-20 DIAGNOSIS — Z79899 Other long term (current) drug therapy: Secondary | ICD-10-CM | POA: Diagnosis not present

## 2014-05-20 DIAGNOSIS — S022XXA Fracture of nasal bones, initial encounter for closed fracture: Secondary | ICD-10-CM | POA: Diagnosis present

## 2014-05-20 DIAGNOSIS — E785 Hyperlipidemia, unspecified: Secondary | ICD-10-CM | POA: Diagnosis present

## 2014-05-20 DIAGNOSIS — Z880 Allergy status to penicillin: Secondary | ICD-10-CM | POA: Diagnosis not present

## 2014-05-20 DIAGNOSIS — S065X0A Traumatic subdural hemorrhage without loss of consciousness, initial encounter: Secondary | ICD-10-CM | POA: Diagnosis present

## 2014-05-20 DIAGNOSIS — S0219XA Other fracture of base of skull, initial encounter for closed fracture: Secondary | ICD-10-CM | POA: Diagnosis present

## 2014-05-20 DIAGNOSIS — Z9049 Acquired absence of other specified parts of digestive tract: Secondary | ICD-10-CM | POA: Diagnosis present

## 2014-05-20 DIAGNOSIS — N183 Chronic kidney disease, stage 3 (moderate): Secondary | ICD-10-CM | POA: Diagnosis present

## 2014-05-20 DIAGNOSIS — S0031XA Abrasion of nose, initial encounter: Secondary | ICD-10-CM | POA: Diagnosis present

## 2014-05-20 DIAGNOSIS — Z86010 Personal history of colonic polyps: Secondary | ICD-10-CM | POA: Diagnosis not present

## 2014-05-20 DIAGNOSIS — E669 Obesity, unspecified: Secondary | ICD-10-CM | POA: Diagnosis present

## 2014-05-20 DIAGNOSIS — E119 Type 2 diabetes mellitus without complications: Secondary | ICD-10-CM | POA: Diagnosis present

## 2014-05-20 DIAGNOSIS — Z887 Allergy status to serum and vaccine status: Secondary | ICD-10-CM | POA: Diagnosis not present

## 2014-05-20 DIAGNOSIS — W19XXXA Unspecified fall, initial encounter: Secondary | ICD-10-CM

## 2014-05-20 DIAGNOSIS — Z23 Encounter for immunization: Secondary | ICD-10-CM | POA: Diagnosis not present

## 2014-05-20 DIAGNOSIS — I129 Hypertensive chronic kidney disease with stage 1 through stage 4 chronic kidney disease, or unspecified chronic kidney disease: Secondary | ICD-10-CM | POA: Diagnosis present

## 2014-05-20 DIAGNOSIS — S065XAA Traumatic subdural hemorrhage with loss of consciousness status unknown, initial encounter: Secondary | ICD-10-CM | POA: Diagnosis present

## 2014-05-20 DIAGNOSIS — S00211A Abrasion of right eyelid and periocular area, initial encounter: Secondary | ICD-10-CM | POA: Diagnosis present

## 2014-05-20 DIAGNOSIS — Z96669 Presence of unspecified artificial ankle joint: Secondary | ICD-10-CM | POA: Diagnosis present

## 2014-05-20 DIAGNOSIS — Z6831 Body mass index (BMI) 31.0-31.9, adult: Secondary | ICD-10-CM | POA: Diagnosis not present

## 2014-05-20 DIAGNOSIS — S065X9A Traumatic subdural hemorrhage with loss of consciousness of unspecified duration, initial encounter: Secondary | ICD-10-CM | POA: Diagnosis present

## 2014-05-20 DIAGNOSIS — Z888 Allergy status to other drugs, medicaments and biological substances status: Secondary | ICD-10-CM | POA: Diagnosis not present

## 2014-05-20 DIAGNOSIS — K219 Gastro-esophageal reflux disease without esophagitis: Secondary | ICD-10-CM | POA: Diagnosis present

## 2014-05-20 DIAGNOSIS — Z7982 Long term (current) use of aspirin: Secondary | ICD-10-CM | POA: Diagnosis not present

## 2014-05-20 LAB — COMPREHENSIVE METABOLIC PANEL
ALK PHOS: 49 U/L (ref 39–117)
ALT: 17 U/L (ref 0–53)
AST: 18 U/L (ref 0–37)
Albumin: 3.1 g/dL — ABNORMAL LOW (ref 3.5–5.2)
Anion gap: 6 (ref 5–15)
BILIRUBIN TOTAL: 0.7 mg/dL (ref 0.3–1.2)
BUN: 19 mg/dL (ref 6–23)
CHLORIDE: 110 mmol/L (ref 96–112)
CO2: 21 mmol/L (ref 19–32)
Calcium: 8.5 mg/dL (ref 8.4–10.5)
Creatinine, Ser: 1.24 mg/dL (ref 0.50–1.35)
GFR calc non Af Amer: 54 mL/min — ABNORMAL LOW (ref >90)
GFR, EST AFRICAN AMERICAN: 62 mL/min — AB (ref >90)
GLUCOSE: 146 mg/dL — AB (ref 70–99)
POTASSIUM: 4.1 mmol/L (ref 3.5–5.1)
SODIUM: 137 mmol/L (ref 135–145)
TOTAL PROTEIN: 5.9 g/dL — AB (ref 6.0–8.3)

## 2014-05-20 LAB — CBC
HEMATOCRIT: 39.4 % (ref 39.0–52.0)
Hemoglobin: 13 g/dL (ref 13.0–17.0)
MCH: 27.5 pg (ref 26.0–34.0)
MCHC: 33 g/dL (ref 30.0–36.0)
MCV: 83.3 fL (ref 78.0–100.0)
Platelets: 171 10*3/uL (ref 150–400)
RBC: 4.73 MIL/uL (ref 4.22–5.81)
RDW: 13.5 % (ref 11.5–15.5)
WBC: 8.5 10*3/uL (ref 4.0–10.5)

## 2014-05-20 LAB — TSH: TSH: 2.971 u[IU]/mL (ref 0.350–4.500)

## 2014-05-20 LAB — GLUCOSE, CAPILLARY
GLUCOSE-CAPILLARY: 130 mg/dL — AB (ref 70–99)
GLUCOSE-CAPILLARY: 181 mg/dL — AB (ref 70–99)
Glucose-Capillary: 148 mg/dL — ABNORMAL HIGH (ref 70–99)
Glucose-Capillary: 160 mg/dL — ABNORMAL HIGH (ref 70–99)

## 2014-05-20 MED ORDER — TRIAMTERENE-HCTZ 75-50 MG PO TABS
1.0000 | ORAL_TABLET | Freq: Every day | ORAL | Status: DC
  Administered 2014-05-20 – 2014-05-21 (×2): 1 via ORAL
  Filled 2014-05-20 (×2): qty 1

## 2014-05-20 MED ORDER — ONDANSETRON HCL 4 MG/2ML IJ SOLN: 4.0000 mg | Freq: Four times a day (QID) | INTRAMUSCULAR | Status: DC | PRN

## 2014-05-20 MED ORDER — ATORVASTATIN CALCIUM 20 MG PO TABS
20.0000 mg | ORAL_TABLET | Freq: Every day | ORAL | Status: DC
  Administered 2014-05-20 – 2014-05-21 (×2): 20 mg via ORAL
  Filled 2014-05-20 (×2): qty 1

## 2014-05-20 MED ORDER — AMLODIPINE BESYLATE 5 MG PO TABS
5.0000 mg | ORAL_TABLET | Freq: Every day | ORAL | Status: DC
  Administered 2014-05-20 – 2014-05-21 (×2): 5 mg via ORAL
  Filled 2014-05-20 (×2): qty 1

## 2014-05-20 MED ORDER — LINAGLIPTIN 5 MG PO TABS
5.0000 mg | ORAL_TABLET | Freq: Every day | ORAL | Status: DC
  Administered 2014-05-20 – 2014-05-21 (×2): 5 mg via ORAL
  Filled 2014-05-20 (×2): qty 1

## 2014-05-20 MED ORDER — ASPIRIN EC 81 MG PO TBEC
81.0000 mg | DELAYED_RELEASE_TABLET | Freq: Every day | ORAL | Status: DC
  Administered 2014-05-20: 81 mg via ORAL
  Filled 2014-05-20: qty 1

## 2014-05-20 MED ORDER — SODIUM CHLORIDE 0.9 % IV SOLN
INTRAVENOUS | Status: DC
  Administered 2014-05-20 (×2): via INTRAVENOUS

## 2014-05-20 MED ORDER — LISINOPRIL 5 MG PO TABS
5.0000 mg | ORAL_TABLET | Freq: Every day | ORAL | Status: DC
  Administered 2014-05-20 – 2014-05-21 (×2): 5 mg via ORAL
  Filled 2014-05-20 (×2): qty 1

## 2014-05-20 MED ORDER — VITAMIN B-1 100 MG PO TABS
100.0000 mg | ORAL_TABLET | Freq: Every day | ORAL | Status: DC
  Administered 2014-05-20 – 2014-05-21 (×2): 100 mg via ORAL
  Filled 2014-05-20 (×2): qty 1

## 2014-05-20 MED ORDER — CALCIUM CARBONATE 1250 (500 CA) MG PO TABS
1250.0000 mg | ORAL_TABLET | Freq: Two times a day (BID) | ORAL | Status: DC
  Administered 2014-05-20 – 2014-05-21 (×3): 1250 mg via ORAL
  Filled 2014-05-20: qty 3

## 2014-05-20 MED ORDER — GLIPIZIDE 10 MG PO TABS
10.0000 mg | ORAL_TABLET | Freq: Two times a day (BID) | ORAL | Status: DC
  Administered 2014-05-20 – 2014-05-21 (×3): 10 mg via ORAL
  Filled 2014-05-20 (×6): qty 1

## 2014-05-20 MED ORDER — FOLIC ACID 1 MG PO TABS
1.0000 mg | ORAL_TABLET | Freq: Every day | ORAL | Status: DC
  Administered 2014-05-20 – 2014-05-21 (×2): 1 mg via ORAL
  Filled 2014-05-20 (×2): qty 1

## 2014-05-20 MED ORDER — METFORMIN HCL ER 500 MG PO TB24
500.0000 mg | ORAL_TABLET | Freq: Two times a day (BID) | ORAL | Status: DC
  Administered 2014-05-20 – 2014-05-21 (×3): 500 mg via ORAL
  Filled 2014-05-20 (×3): qty 1

## 2014-05-20 MED ORDER — ADULT MULTIVITAMIN W/MINERALS CH
1.0000 | ORAL_TABLET | Freq: Every day | ORAL | Status: DC
  Administered 2014-05-20 – 2014-05-21 (×2): 1 via ORAL
  Filled 2014-05-20 (×2): qty 1

## 2014-05-20 MED ORDER — ONDANSETRON HCL 4 MG PO TABS: 4.0000 mg | ORAL_TABLET | Freq: Four times a day (QID) | ORAL | Status: DC | PRN

## 2014-05-20 MED ORDER — OXYCODONE HCL 5 MG PO TABS
5.0000 mg | ORAL_TABLET | ORAL | Status: DC | PRN
  Administered 2014-05-20 – 2014-05-21 (×3): 5 mg via ORAL
  Filled 2014-05-20 (×3): qty 1

## 2014-05-20 NOTE — Progress Notes (Signed)
UR completed 

## 2014-05-20 NOTE — Evaluation (Signed)
Physical Therapy Evaluation Patient Details Name: Vincent Allen MRN: [id number] DOB: 01/24/1934 Today's Date: 05/20/2014   History of Present Illness  s/p fall (due to trip over rug) with nasal fx's and Lt SDH  PMHx-ankle ORIF; HTN: HLD  Clinical Impression  Patient evaluated by Physical Therapy with no further acute PT needs identified. Pt denies h/o imbalance and truly tripped to cause this fall. Independent with all balance testing. All education has been completed and the patient has no further questions. PT is signing off. Thank you for this referral.     Follow Up Recommendations No PT follow up    Equipment Recommendations  None recommended by PT    Recommendations for Other Services       Precautions / Restrictions Precautions Precautions: Fall      Mobility  Bed Mobility                  Transfers Overall transfer level: Independent Equipment used: None                Ambulation/Gait Ambulation/Gait assistance: Independent Ambulation Distance (Feet): 150 Feet Assistive device: None Gait Pattern/deviations: Step-through pattern;Decreased stance time - right;Antalgic;Wide base of support Gait velocity: decr due to knee pain      Stairs            Wheelchair Mobility    Modified Rankin (Stroke Patients Only)       Balance Overall balance assessment: Independent                               Standardized Balance Assessment Standardized Balance Assessment : Berg Balance Test;Dynamic Gait Index Berg Balance Test Sit to Stand: Able to stand without using hands and stabilize independently Standing Unsupported: Able to stand safely 2 minutes Sitting with Back Unsupported but Feet Supported on Floor or Stool: Able to sit safely and securely 2 minutes Stand to Sit: Sits safely with minimal use of hands Transfers: Able to transfer safely, minor use of hands Standing Unsupported with Eyes Closed: Able to stand 10 seconds  safely Standing Ubsupported with Feet Together: Able to place feet together independently and stand 1 minute safely From Standing, Reach Forward with Outstretched Arm: Can reach confidently >25 cm (10") Turn 360 Degrees: Able to turn 360 degrees safely in 4 seconds or less Standing Unsupported, One Foot in Front: Able to place foot tandem independently and hold 30 seconds Standing on One Leg: Tries to lift leg/unable to hold 3 seconds but remains standing independently Dynamic Gait Index Level Surface: Normal Change in Gait Speed: Mild Impairment Gait with Horizontal Head Turns: Normal Gait with Vertical Head Turns: Normal Gait and Pivot Turn: Normal Step Around Obstacles: Normal       Pertinent Vitals/Pain Pain Assessment: No/denies pain    Home Living Family/patient expects to be discharged to:: Private residence Living Arrangements: Alone Available Help at Discharge: Family;Available PRN/intermittently Type of Home: Apartment Home Access: Level entry     Home Layout: One level Home Equipment: None      Prior Function Level of Independence: Independent         Comments: reports bad Rt knee, but does not buckle      Hand Dominance   Dominant Hand: Right    Extremity/Trunk Assessment   Upper Extremity Assessment: Overall WFL for tasks assessed           Lower Extremity Assessment: Overall WFL for tasks assessed  Cervical / Trunk Assessment: Normal  Communication   Communication: HOH  Cognition Arousal/Alertness: Awake/alert Behavior During Therapy: WFL for tasks assessed/performed Overall Cognitive Status: Within Functional Limits for tasks assessed                      General Comments      Exercises        Assessment/Plan    PT Assessment Patent does not need any further PT services  PT Diagnosis Difficulty walking   PT Problem List    PT Treatment Interventions     PT Goals (Current goals can be found in the Care Plan  section) Acute Rehab PT Goals PT Goal Formulation: All assessment and education complete, DC therapy    Frequency     Barriers to discharge        Co-evaluation               End of Session Equipment Utilized During Treatment: Gait belt Activity Tolerance: Patient tolerated treatment well Patient left: in bed;with call bell/phone within reach Nurse Communication: Mobility status    Functional Assessment Tool Used: clinical judgement Functional Limitation: Mobility: Walking and moving around Mobility: Walking and Moving Around Current Status [id number]): At least 1 percent but less than 20 percent impaired, limited or restricted Mobility: Walking and Moving Around Goal Status [id number]): At least 1 percent but less than 20 percent impaired, limited or restricted Mobility: Walking and Moving Around Discharge Status [phone]): At least 1 percent but less than 20 percent impaired, limited or restricted    Time: 1356-1412 PT Time Calculation (min) (ACUTE ONLY): 16 min   Charges:   PT Evaluation $Initial PT Evaluation Tier I: 1 Procedure     PT G Codes:   PT G-Codes **NOT FOR INPATIENT CLASS** Functional Assessment Tool Used: clinical judgement Functional Limitation: Mobility: Walking and moving around Mobility: Walking and Moving Around Current Status [id number]): At least 1 percent but less than 20 percent impaired, limited or restricted Mobility: Walking and Moving Around Goal Status [phone]): At least 1 percent but less than 20 percent impaired, limited or restricted Mobility: Walking and Moving Around Discharge Status [phone]): At least 1 percent but less than 20 percent impaired, limited or restricted    Vincent Allen 05/20/2014, 2:23 PM Pager [phone]

## 2014-05-20 NOTE — Progress Notes (Signed)
OT Cancellation Note  Patient Details Name: Vincent Allen MRN: [id number] DOB: 1933/04/02   Cancelled Treatment:    Reason Eval/Treat Not Completed: OT screened, no needs identified, will sign off. Got message from PT that pt is at his baseline and no OT needs identified, OT eval will not be completed.  Almon Register [id number] 05/20/2014, 2:19 PM

## 2014-05-20 NOTE — Progress Notes (Signed)
[facility] PROGRESS NOTE  SANJAYA PHILEMON [id number] DOB: February 15, 1934 DOA: 05/19/2014 PCP: [name], MD  Assessment/Plan: Active Problems:   Hyperlipidemia   Chronic kidney disease stage III (GFR 30-59 ml/min)   Diabetes mellitus type 2 in obese   Fracture closed, nasal bone   Subdural hematoma   Nasal fracture   acute subdural hemorrhage along the left side measuring up to 3 mm in thickness DC asa,  Avoid heparin Repeat CT scan tomorrow  To assess stability   Minimally displaced nasal and frontal sinus fractures. No need for surgical intervention. Follow up as out patient if needed Pain control with oxycodone   HTN  Cont norvasc and lisinopril Stable   DM type 2  Cont metformin and glipizide Cont tradejenta    CKD STAGE 3 Cr stable     Code Status: full Family Communication: family updated about patient's clinical progress Disposition Plan: pt /ot eval   Brief narrative: Vincent Allen is a 81 y.o. male presents after a fall. Patient states he was walking in his hallway and tripped over his rug. Patient fell on his face and right side. He states he started to have a nosebleed. He states he was a little dazed but did not lose consciousness. Patient states that he called his son to get him. In the ED he had a evaluation with CT head and he was noted to have a subdural hematoma as well as nasal fractures. Neurosurgery was called and they suggested observation over night. He had been having a headache and it is better. No other complaints  Consultants:  ENT   Procedures:  None   Antibiotics: None   HPI/Subjective: Headache and facial pain   Objective: Filed Vitals:   05/19/14 2245 05/19/14 2330 05/20/14 0031 05/20/14 0706  BP: 163/62 152/71 147/57 148/59  Pulse: 58 58 56 60  Temp:   97.7 F (36.5 C) 98 F (36.7 C)  TempSrc:   Oral Oral  Resp: 12 10 15 16   Height:   6' (1.829 m)   Weight:   105.643 kg (232 lb 14.4 oz)   SpO2: 99%  98% 95% 96%    Intake/Output Summary (Last 24 hours) at 05/20/14 1020 Last data filed at 05/20/14 0640  Gross per 24 hour  Intake    140 ml  Output    825 ml  Net   -685 ml    Exam:  General:Multiple facial abrasions over the nose and right brow. There is a small 2 cm hematoma on the medial right brow Lungs: Clear to auscultation bilaterally without wheezes or crackles Cardiovascular: Regular rate and rhythm without murmur gallop or rub normal S1 and S2 Abdomen: Nontender, nondistended, soft, bowel sounds positive, no rebound, no ascites, no appreciable mass Extremities: No significant cyanosis, clubbing, or edema bilateral lower extremities      Data Reviewed: Basic Metabolic Panel:  Recent Labs Lab 05/19/14 2125 05/20/14 0140  NA 135 137  K 4.7 4.1  CL 108 110  CO2 22 21  GLUCOSE 179* 146*  BUN 19 19  CREATININE 1.35 1.24  CALCIUM 9.0 8.5    Liver Function Tests:  Recent Labs Lab 05/20/14 0140  AST 18  ALT 17  ALKPHOS 49  BILITOT 0.7  PROT 5.9*  ALBUMIN 3.1*   No results for input(s): LIPASE, AMYLASE in the last 168 hours. No results for input(s): AMMONIA in the last 168 hours.  CBC:  Recent Labs Lab 05/19/14 2125 05/20/14 0140  WBC 10.0  8.5  NEUTROABS 7.7  --   HGB 15.0 13.0  HCT 44.3 39.4  MCV 83.3 83.3  PLT 184 171    Cardiac Enzymes: No results for input(s): CKTOTAL, CKMB, CKMBINDEX, TROPONINI in the last 168 hours. BNP (last 3 results) No results for input(s): BNP in the last 8760 hours.  ProBNP (last 3 results) No results for input(s): PROBNP in the last 8760 hours.    CBG:  Recent Labs Lab 05/20/14 0735  GLUCAP 130*    No results found for this or any previous visit (from the past 240 hour(s)).   Studies: Dg Shoulder Right  05/19/2014   CLINICAL DATA:  Fall today with shoulder pain, initial encounter  EXAM: RIGHT SHOULDER - 2+ VIEW  COMPARISON:  05/06/2005  FINDINGS: Postsurgical changes are noted. Degenerative changes  of the glenohumeral articulation as well as the acromioclavicular joint are seen. Humeral head is high-riding which may be indicative of a chronic rotator cuff injury. No acute fracture is seen. The underlying bony thorax is within normal limits.  IMPRESSION: Chronic and postsurgical changes.  No acute abnormality is noted.   Electronically Signed   By: Inez Catalina M.D.   On: 05/19/2014 22:03   Ct Head Wo Contrast  05/19/2014   CLINICAL DATA:  Tripped and fell, with abrasions about the face. Question of loss of consciousness. Patient feeling dazed. Concern for maxillofacial or cervical spine injury. Initial encounter.  EXAM: CT HEAD WITHOUT CONTRAST  CT MAXILLOFACIAL WITHOUT CONTRAST  CT CERVICAL SPINE WITHOUT CONTRAST  TECHNIQUE: Multidetector CT imaging of the head, cervical spine, and maxillofacial structures were performed using the standard protocol without intravenous contrast. Multiplanar CT image reconstructions of the cervical spine and maxillofacial structures were also generated.  COMPARISON:  None.  FINDINGS: CT HEAD FINDINGS  A small amount of acute subdural hemorrhage is noted along the left side of the anterior falx cerebri, measuring up to 3 mm in thickness.  Prominence of the ventricles and sulci reflects mild to moderate cortical volume loss. Mild cerebellar atrophy is noted. Scattered periventricular and subcortical white matter change likely reflects small vessel ischemic microangiopathy. Small chronic lacunar infarcts are seen in the cerebellar hemispheres bilaterally.  The brainstem and fourth ventricle are within normal limits. The basal ganglia are unremarkable in appearance. The cerebral hemispheres demonstrate grossly normal gray-white differentiation. No mass effect or midline shift is seen.  There is a slightly comminuted fracture of the left side of the nasal bone, and also a minimally comminuted fracture through the outer and inner tables of the left frontal sinus, with blood  partially filling the left frontal sinus. The orbits are within normal limits. The remaining paranasal sinuses and mastoid air cells are well-aerated. Mild soft tissue swelling is noted overlying the right frontal calvarium.  CT MAXILLOFACIAL FINDINGS  There is a minimally comminuted fracture involving the left-sided nasal bone, with mild displacement. There are also minimally displaced fractures through the inner and outer tables of the left frontal sinus. The maxilla and mandible appear intact. There is chronic absence of the dentition.  The orbits are intact bilaterally. Blood is noted partially filling the left frontal sinus. Visualized paranasal sinuses and mastoid air cells are well-aerated.  Mild soft tissue swelling is noted overlying the right frontal calvarium. Soft tissue swelling is noted overlying the nose, with scattered soft tissue air on the left side of the nose. The parapharyngeal fat planes are preserved. The nasopharynx, oropharynx and hypopharynx are unremarkable in appearance. The visualized portions  of the valleculae and piriform sinuses are grossly unremarkable.  The parotid and submandibular glands are within normal limits. No cervical lymphadenopathy is seen.  CT CERVICAL SPINE FINDINGS  There is no evidence of acute fracture or subluxation. There is grade 1 anterolisthesis of C2 on C3, reflecting underlying facet disease. Multilevel disc space narrowing is noted along the cervical spine, with scattered anterior and posterior disc osteophyte complexes. Vertebral bodies demonstrate normal height. Prevertebral soft tissues are within normal limits.  The visualized portions of the thyroid gland are unremarkable in appearance. Mild calcification is seen at the carotid bifurcations bilaterally.  IMPRESSION: 1. Small amount of acute subdural hemorrhage along the left side of the anterior falx cerebri, measuring up to 3 mm in thickness. 2. Slightly comminuted fracture of the left side of the nasal  bone, with mild soft tissue swelling and mild soft tissue air. 3. Minimally comminuted fracture through the outer and inner tables of the left frontal sinus, with blood partially filling the left frontal sinus. 4. Mild soft tissue swelling overlying the right frontal calvarium. 5. No evidence of acute fracture or subluxation along the cervical spine. 6. Mild to moderate cortical volume loss and scattered small vessel ischemic microangiopathy. Small chronic lacunar infarcts in the cerebellar hemispheres bilaterally. 7. Grade 1 anterolisthesis of C2 on C3, reflecting underlying facet disease. 8. Mild degenerative change along the cervical spine. 9. Mild calcification at the carotid bifurcations bilaterally.  Critical Value/emergent results were called by telephone at the time of interpretation on 05/19/2014 at 8:53 pm to [facility] PA, who verbally acknowledged these results.   Electronically Signed   By: [name] M.D.   On: 05/19/2014 20:54   Ct Cervical Spine Wo Contrast  05/19/2014   CLINICAL DATA:  Tripped and fell, with abrasions about the face. Question of loss of consciousness. Patient feeling dazed. Concern for maxillofacial or cervical spine injury. Initial encounter.  EXAM: CT HEAD WITHOUT CONTRAST  CT MAXILLOFACIAL WITHOUT CONTRAST  CT CERVICAL SPINE WITHOUT CONTRAST  TECHNIQUE: Multidetector CT imaging of the head, cervical spine, and maxillofacial structures were performed using the standard protocol without intravenous contrast. Multiplanar CT image reconstructions of the cervical spine and maxillofacial structures were also generated.  COMPARISON:  None.  FINDINGS: CT HEAD FINDINGS  A small amount of acute subdural hemorrhage is noted along the left side of the anterior falx cerebri, measuring up to 3 mm in thickness.  Prominence of the ventricles and sulci reflects mild to moderate cortical volume loss. Mild cerebellar atrophy is noted. Scattered periventricular and subcortical white matter  change likely reflects small vessel ischemic microangiopathy. Small chronic lacunar infarcts are seen in the cerebellar hemispheres bilaterally.  The brainstem and fourth ventricle are within normal limits. The basal ganglia are unremarkable in appearance. The cerebral hemispheres demonstrate grossly normal gray-white differentiation. No mass effect or midline shift is seen.  There is a slightly comminuted fracture of the left side of the nasal bone, and also a minimally comminuted fracture through the outer and inner tables of the left frontal sinus, with blood partially filling the left frontal sinus. The orbits are within normal limits. The remaining paranasal sinuses and mastoid air cells are well-aerated. Mild soft tissue swelling is noted overlying the right frontal calvarium.  CT MAXILLOFACIAL FINDINGS  There is a minimally comminuted fracture involving the left-sided nasal bone, with mild displacement. There are also minimally displaced fractures through the inner and outer tables of the left frontal sinus. The maxilla and mandible  appear intact. There is chronic absence of the dentition.  The orbits are intact bilaterally. Blood is noted partially filling the left frontal sinus. Visualized paranasal sinuses and mastoid air cells are well-aerated.  Mild soft tissue swelling is noted overlying the right frontal calvarium. Soft tissue swelling is noted overlying the nose, with scattered soft tissue air on the left side of the nose. The parapharyngeal fat planes are preserved. The nasopharynx, oropharynx and hypopharynx are unremarkable in appearance. The visualized portions of the valleculae and piriform sinuses are grossly unremarkable.  The parotid and submandibular glands are within normal limits. No cervical lymphadenopathy is seen.  CT CERVICAL SPINE FINDINGS  There is no evidence of acute fracture or subluxation. There is grade 1 anterolisthesis of C2 on C3, reflecting underlying facet disease.  Multilevel disc space narrowing is noted along the cervical spine, with scattered anterior and posterior disc osteophyte complexes. Vertebral bodies demonstrate normal height. Prevertebral soft tissues are within normal limits.  The visualized portions of the thyroid gland are unremarkable in appearance. Mild calcification is seen at the carotid bifurcations bilaterally.  IMPRESSION: 1. Small amount of acute subdural hemorrhage along the left side of the anterior falx cerebri, measuring up to 3 mm in thickness. 2. Slightly comminuted fracture of the left side of the nasal bone, with mild soft tissue swelling and mild soft tissue air. 3. Minimally comminuted fracture through the outer and inner tables of the left frontal sinus, with blood partially filling the left frontal sinus. 4. Mild soft tissue swelling overlying the right frontal calvarium. 5. No evidence of acute fracture or subluxation along the cervical spine. 6. Mild to moderate cortical volume loss and scattered small vessel ischemic microangiopathy. Small chronic lacunar infarcts in the cerebellar hemispheres bilaterally. 7. Grade 1 anterolisthesis of C2 on C3, reflecting underlying facet disease. 8. Mild degenerative change along the cervical spine. 9. Mild calcification at the carotid bifurcations bilaterally.  Critical Value/emergent results were called by telephone at the time of interpretation on 05/19/2014 at 8:53 pm to [facility] PA, who verbally acknowledged these results.   Electronically Signed   By: [name] M.D.   On: 05/19/2014 20:54   Ct Maxillofacial Wo Cm  05/19/2014   CLINICAL DATA:  Tripped and fell, with abrasions about the face. Question of loss of consciousness. Patient feeling dazed. Concern for maxillofacial or cervical spine injury. Initial encounter.  EXAM: CT HEAD WITHOUT CONTRAST  CT MAXILLOFACIAL WITHOUT CONTRAST  CT CERVICAL SPINE WITHOUT CONTRAST  TECHNIQUE: Multidetector CT imaging of the head, cervical spine, and  maxillofacial structures were performed using the standard protocol without intravenous contrast. Multiplanar CT image reconstructions of the cervical spine and maxillofacial structures were also generated.  COMPARISON:  None.  FINDINGS: CT HEAD FINDINGS  A small amount of acute subdural hemorrhage is noted along the left side of the anterior falx cerebri, measuring up to 3 mm in thickness.  Prominence of the ventricles and sulci reflects mild to moderate cortical volume loss. Mild cerebellar atrophy is noted. Scattered periventricular and subcortical white matter change likely reflects small vessel ischemic microangiopathy. Small chronic lacunar infarcts are seen in the cerebellar hemispheres bilaterally.  The brainstem and fourth ventricle are within normal limits. The basal ganglia are unremarkable in appearance. The cerebral hemispheres demonstrate grossly normal gray-white differentiation. No mass effect or midline shift is seen.  There is a slightly comminuted fracture of the left side of the nasal bone, and also a minimally comminuted fracture through the outer and  inner tables of the left frontal sinus, with blood partially filling the left frontal sinus. The orbits are within normal limits. The remaining paranasal sinuses and mastoid air cells are well-aerated. Mild soft tissue swelling is noted overlying the right frontal calvarium.  CT MAXILLOFACIAL FINDINGS  There is a minimally comminuted fracture involving the left-sided nasal bone, with mild displacement. There are also minimally displaced fractures through the inner and outer tables of the left frontal sinus. The maxilla and mandible appear intact. There is chronic absence of the dentition.  The orbits are intact bilaterally. Blood is noted partially filling the left frontal sinus. Visualized paranasal sinuses and mastoid air cells are well-aerated.  Mild soft tissue swelling is noted overlying the right frontal calvarium. Soft tissue swelling is  noted overlying the nose, with scattered soft tissue air on the left side of the nose. The parapharyngeal fat planes are preserved. The nasopharynx, oropharynx and hypopharynx are unremarkable in appearance. The visualized portions of the valleculae and piriform sinuses are grossly unremarkable.  The parotid and submandibular glands are within normal limits. No cervical lymphadenopathy is seen.  CT CERVICAL SPINE FINDINGS  There is no evidence of acute fracture or subluxation. There is grade 1 anterolisthesis of C2 on C3, reflecting underlying facet disease. Multilevel disc space narrowing is noted along the cervical spine, with scattered anterior and posterior disc osteophyte complexes. Vertebral bodies demonstrate normal height. Prevertebral soft tissues are within normal limits.  The visualized portions of the thyroid gland are unremarkable in appearance. Mild calcification is seen at the carotid bifurcations bilaterally.  IMPRESSION: 1. Small amount of acute subdural hemorrhage along the left side of the anterior falx cerebri, measuring up to 3 mm in thickness. 2. Slightly comminuted fracture of the left side of the nasal bone, with mild soft tissue swelling and mild soft tissue air. 3. Minimally comminuted fracture through the outer and inner tables of the left frontal sinus, with blood partially filling the left frontal sinus. 4. Mild soft tissue swelling overlying the right frontal calvarium. 5. No evidence of acute fracture or subluxation along the cervical spine. 6. Mild to moderate cortical volume loss and scattered small vessel ischemic microangiopathy. Small chronic lacunar infarcts in the cerebellar hemispheres bilaterally. 7. Grade 1 anterolisthesis of C2 on C3, reflecting underlying facet disease. 8. Mild degenerative change along the cervical spine. 9. Mild calcification at the carotid bifurcations bilaterally.  Critical Value/emergent results were called by telephone at the time of interpretation on  05/19/2014 at 8:53 pm to [facility] PA, who verbally acknowledged these results.   Electronically Signed   By: [name] M.D.   On: 05/19/2014 20:54    Scheduled Meds: . amLODipine  5 mg Oral Daily  . aspirin EC  81 mg Oral Daily  . atorvastatin  20 mg Oral Daily  . calcium carbonate  1,250 mg Oral BID WC  . folic acid  1 mg Oral Daily  . glipiZIDE  10 mg Oral BID AC  . linagliptin  5 mg Oral Daily  . lisinopril  5 mg Oral Daily  . metFORMIN  500 mg Oral BID WC  . multivitamin with minerals  1 tablet Oral Daily  . thiamine  100 mg Oral Daily  . triamterene-hydrochlorothiazide  1 tablet Oral Daily   Continuous Infusions: . sodium chloride 50 mL/hr at 05/20/14 [id number]    Active Problems:   Hyperlipidemia   Chronic kidney disease stage III (GFR 30-59 ml/min)   Diabetes mellitus type 2 in  obese   Fracture closed, nasal bone   Subdural hematoma   Nasal fracture    Time spent: 40 minutes   [facility] Pager [phone]. If 7PM-7AM, please contact night-coverage at [email or web address], password [facility] 05/20/2014, 10:20 AM

## 2014-05-20 NOTE — Consult Note (Signed)
Reason for Consult:Facial trauma Referring Physician: Reyne Dumas, MD  Vincent Allen is an 81 y.o. male.  HPI: Facial/head injury yesterday. Complains of headache but not severe. No visual complaints. Breathing through his nose.  Past Medical History  Diagnosis Date  . Cataract   . GERD (gastroesophageal reflux disease)   . Diabetes mellitus   . Hypertension   . Hyperlipidemia   . Colon polyps   . Chronic kidney disease stage III (GFR 30-59 ml/min) 05/03/2012  . Irregular heartbeat   . HTN (hypertension) 01/13/2013  . Preventative health care 01/17/2013  . Medicare annual wellness visit, subsequent 01/17/2013    Past Surgical History  Procedure Laterality Date  . Cataract extraction    . Rotator cuff repair    . Ankle arthroplasty    . Tonsillectomy    . Cholecystectomy    . Cholecystectomy N/A 05/03/2012    Procedure: LAPAROSCOPIC CHOLECYSTECTOMY WITH INTRAOPERATIVE CHOLANGIOGRAM;  Surgeon: [name], MD;  Location: [facility];  Service: General;  Laterality: N/A;  . Cataract extraction  02/28/14    Family History  Problem Relation Age of Onset  . Cancer Mother     colon, breast  . Heart disease Father   . Stroke Father   . Cancer Paternal Grandmother     colon    Social History:  reports that he has never smoked. He has never used smokeless tobacco. He reports that he does not drink alcohol or use illicit drugs.  Allergies:  Allergies  Allergen Reactions  . Penicillins Hives and Itching  . Verapamil     Junctional rhythm  . Influenza Vaccines Rash    Medications: Reviewed  Results for orders placed or performed during the hospital encounter of 05/19/14 (from the past 48 hour(s))  Urinalysis, Routine w reflex microscopic     Status: None   Collection Time: 05/19/14  9:17 PM  Result Value Ref Range   Color, Urine YELLOW YELLOW   APPearance CLEAR CLEAR   Specific Gravity, Urine 1.013 1.005 - 1.030   pH 5.0 5.0 - 8.0   Glucose, UA NEGATIVE NEGATIVE  mg/dL   Hgb urine dipstick NEGATIVE NEGATIVE   Bilirubin Urine NEGATIVE NEGATIVE   Ketones, ur NEGATIVE NEGATIVE mg/dL   Protein, ur NEGATIVE NEGATIVE mg/dL   Urobilinogen, UA 0.2 0.0 - 1.0 mg/dL   Nitrite NEGATIVE NEGATIVE   Leukocytes, UA NEGATIVE NEGATIVE    Comment: MICROSCOPIC NOT DONE ON URINES WITH NEGATIVE PROTEIN, BLOOD, LEUKOCYTES, NITRITE, OR GLUCOSE <1000 mg/dL.  CBC with Differential     Status: None   Collection Time: 05/19/14  9:25 PM  Result Value Ref Range   WBC 10.0 4.0 - 10.5 K/uL   RBC 5.32 4.22 - 5.81 MIL/uL   Hemoglobin 15.0 13.0 - 17.0 g/dL   HCT 44.3 39.0 - 52.0 %   MCV 83.3 78.0 - 100.0 fL   MCH 28.2 26.0 - 34.0 pg   MCHC 33.9 30.0 - 36.0 g/dL   RDW 13.5 11.5 - 15.5 %   Platelets 184 150 - 400 K/uL   Neutrophils Relative % 76 43 - 77 %   Neutro Abs 7.7 1.7 - 7.7 K/uL   Lymphocytes Relative 13 12 - 46 %   Lymphs Abs 1.3 0.7 - 4.0 K/uL   Monocytes Relative 9 3 - 12 %   Monocytes Absolute 0.9 0.1 - 1.0 K/uL   Eosinophils Relative 2 0 - 5 %   Eosinophils Absolute 0.2 0.0 - 0.7 K/uL  Basophils Relative 0 0 - 1 %   Basophils Absolute 0.0 0.0 - 0.1 K/uL  Basic metabolic panel     Status: Abnormal   Collection Time: 05/19/14  9:25 PM  Result Value Ref Range   Sodium 135 135 - 145 mmol/L   Potassium 4.7 3.5 - 5.1 mmol/L   Chloride 108 96 - 112 mmol/L   CO2 22 19 - 32 mmol/L   Glucose, Bld 179 (H) 70 - 99 mg/dL   BUN 19 6 - 23 mg/dL   Creatinine, Ser 1.35 0.50 - 1.35 mg/dL   Calcium 9.0 8.4 - 10.5 mg/dL   GFR calc non Af Amer 49 (L) >90 mL/min   GFR calc Af Amer 56 (L) >90 mL/min    Comment: (NOTE) The eGFR has been calculated using the CKD EPI equation. This calculation has not been validated in all clinical situations. eGFR's persistently <90 mL/min signify possible Chronic Kidney Disease.    Anion gap 5 5 - 15  I-stat troponin, ED     Status: None   Collection Time: 05/19/14  9:32 PM  Result Value Ref Range   Troponin i, poc 0.02 0.00 - 0.08  ng/mL   Comment 3            Comment: Due to the release kinetics of cTnI, a negative result within the first hours of the onset of symptoms does not rule out myocardial infarction with certainty. If myocardial infarction is still suspected, repeat the test at appropriate intervals.   TSH     Status: None   Collection Time: 05/20/14  1:40 AM  Result Value Ref Range   TSH 2.971 0.350 - 4.500 uIU/mL  Comprehensive metabolic panel     Status: Abnormal   Collection Time: 05/20/14  1:40 AM  Result Value Ref Range   Sodium 137 135 - 145 mmol/L   Potassium 4.1 3.5 - 5.1 mmol/L   Chloride 110 96 - 112 mmol/L   CO2 21 19 - 32 mmol/L   Glucose, Bld 146 (H) 70 - 99 mg/dL   BUN 19 6 - 23 mg/dL   Creatinine, Ser 1.24 0.50 - 1.35 mg/dL   Calcium 8.5 8.4 - 10.5 mg/dL   Total Protein 5.9 (L) 6.0 - 8.3 g/dL   Albumin 3.1 (L) 3.5 - 5.2 g/dL   AST 18 0 - 37 U/L   ALT 17 0 - 53 U/L   Alkaline Phosphatase 49 39 - 117 U/L   Total Bilirubin 0.7 0.3 - 1.2 mg/dL   GFR calc non Af Amer 54 (L) >90 mL/min   GFR calc Af Amer 62 (L) >90 mL/min    Comment: (NOTE) The eGFR has been calculated using the CKD EPI equation. This calculation has not been validated in all clinical situations. eGFR's persistently <90 mL/min signify possible Chronic Kidney Disease.    Anion gap 6 5 - 15  CBC     Status: None   Collection Time: 05/20/14  1:40 AM  Result Value Ref Range   WBC 8.5 4.0 - 10.5 K/uL   RBC 4.73 4.22 - 5.81 MIL/uL   Hemoglobin 13.0 13.0 - 17.0 g/dL   HCT 39.4 39.0 - 52.0 %   MCV 83.3 78.0 - 100.0 fL   MCH 27.5 26.0 - 34.0 pg   MCHC 33.0 30.0 - 36.0 g/dL   RDW 13.5 11.5 - 15.5 %   Platelets 171 150 - 400 K/uL  Glucose, capillary     Status: Abnormal  Collection Time: 05/20/14  7:35 AM  Result Value Ref Range   Glucose-Capillary 130 (H) 70 - 99 mg/dL    Dg Shoulder Right  05/19/2014   CLINICAL DATA:  Fall today with shoulder pain, initial encounter  EXAM: RIGHT SHOULDER - 2+ VIEW   COMPARISON:  05/06/2005  FINDINGS: Postsurgical changes are noted. Degenerative changes of the glenohumeral articulation as well as the acromioclavicular joint are seen. Humeral head is high-riding which may be indicative of a chronic rotator cuff injury. No acute fracture is seen. The underlying bony thorax is within normal limits.  IMPRESSION: Chronic and postsurgical changes.  No acute abnormality is noted.   Electronically Signed   By: Inez Catalina M.D.   On: 05/19/2014 22:03   Ct Head Wo Contrast  05/19/2014   CLINICAL DATA:  Tripped and fell, with abrasions about the face. Question of loss of consciousness. Patient feeling dazed. Concern for maxillofacial or cervical spine injury. Initial encounter.  EXAM: CT HEAD WITHOUT CONTRAST  CT MAXILLOFACIAL WITHOUT CONTRAST  CT CERVICAL SPINE WITHOUT CONTRAST  TECHNIQUE: Multidetector CT imaging of the head, cervical spine, and maxillofacial structures were performed using the standard protocol without intravenous contrast. Multiplanar CT image reconstructions of the cervical spine and maxillofacial structures were also generated.  COMPARISON:  None.  FINDINGS: CT HEAD FINDINGS  A small amount of acute subdural hemorrhage is noted along the left side of the anterior falx cerebri, measuring up to 3 mm in thickness.  Prominence of the ventricles and sulci reflects mild to moderate cortical volume loss. Mild cerebellar atrophy is noted. Scattered periventricular and subcortical white matter change likely reflects small vessel ischemic microangiopathy. Small chronic lacunar infarcts are seen in the cerebellar hemispheres bilaterally.  The brainstem and fourth ventricle are within normal limits. The basal ganglia are unremarkable in appearance. The cerebral hemispheres demonstrate grossly normal gray-white differentiation. No mass effect or midline shift is seen.  There is a slightly comminuted fracture of the left side of the nasal bone, and also a minimally comminuted  fracture through the outer and inner tables of the left frontal sinus, with blood partially filling the left frontal sinus. The orbits are within normal limits. The remaining paranasal sinuses and mastoid air cells are well-aerated. Mild soft tissue swelling is noted overlying the right frontal calvarium.  CT MAXILLOFACIAL FINDINGS  There is a minimally comminuted fracture involving the left-sided nasal bone, with mild displacement. There are also minimally displaced fractures through the inner and outer tables of the left frontal sinus. The maxilla and mandible appear intact. There is chronic absence of the dentition.  The orbits are intact bilaterally. Blood is noted partially filling the left frontal sinus. Visualized paranasal sinuses and mastoid air cells are well-aerated.  Mild soft tissue swelling is noted overlying the right frontal calvarium. Soft tissue swelling is noted overlying the nose, with scattered soft tissue air on the left side of the nose. The parapharyngeal fat planes are preserved. The nasopharynx, oropharynx and hypopharynx are unremarkable in appearance. The visualized portions of the valleculae and piriform sinuses are grossly unremarkable.  The parotid and submandibular glands are within normal limits. No cervical lymphadenopathy is seen.  CT CERVICAL SPINE FINDINGS  There is no evidence of acute fracture or subluxation. There is grade 1 anterolisthesis of C2 on C3, reflecting underlying facet disease. Multilevel disc space narrowing is noted along the cervical spine, with scattered anterior and posterior disc osteophyte complexes. Vertebral bodies demonstrate normal height. Prevertebral soft tissues are within  normal limits.  The visualized portions of the thyroid gland are unremarkable in appearance. Mild calcification is seen at the carotid bifurcations bilaterally.  IMPRESSION: 1. Small amount of acute subdural hemorrhage along the left side of the anterior falx cerebri, measuring up  to 3 mm in thickness. 2. Slightly comminuted fracture of the left side of the nasal bone, with mild soft tissue swelling and mild soft tissue air. 3. Minimally comminuted fracture through the outer and inner tables of the left frontal sinus, with blood partially filling the left frontal sinus. 4. Mild soft tissue swelling overlying the right frontal calvarium. 5. No evidence of acute fracture or subluxation along the cervical spine. 6. Mild to moderate cortical volume loss and scattered small vessel ischemic microangiopathy. Small chronic lacunar infarcts in the cerebellar hemispheres bilaterally. 7. Grade 1 anterolisthesis of C2 on C3, reflecting underlying facet disease. 8. Mild degenerative change along the cervical spine. 9. Mild calcification at the carotid bifurcations bilaterally.  Critical Value/emergent results were called by telephone at the time of interpretation on 05/19/2014 at 8:53 pm to [facility] PA, who verbally acknowledged these results.   Electronically Signed   By: [name] M.D.   On: 05/19/2014 20:54   Ct Cervical Spine Wo Contrast  05/19/2014   CLINICAL DATA:  Tripped and fell, with abrasions about the face. Question of loss of consciousness. Patient feeling dazed. Concern for maxillofacial or cervical spine injury. Initial encounter.  EXAM: CT HEAD WITHOUT CONTRAST  CT MAXILLOFACIAL WITHOUT CONTRAST  CT CERVICAL SPINE WITHOUT CONTRAST  TECHNIQUE: Multidetector CT imaging of the head, cervical spine, and maxillofacial structures were performed using the standard protocol without intravenous contrast. Multiplanar CT image reconstructions of the cervical spine and maxillofacial structures were also generated.  COMPARISON:  None.  FINDINGS: CT HEAD FINDINGS  A small amount of acute subdural hemorrhage is noted along the left side of the anterior falx cerebri, measuring up to 3 mm in thickness.  Prominence of the ventricles and sulci reflects mild to moderate cortical volume loss. Mild  cerebellar atrophy is noted. Scattered periventricular and subcortical white matter change likely reflects small vessel ischemic microangiopathy. Small chronic lacunar infarcts are seen in the cerebellar hemispheres bilaterally.  The brainstem and fourth ventricle are within normal limits. The basal ganglia are unremarkable in appearance. The cerebral hemispheres demonstrate grossly normal gray-white differentiation. No mass effect or midline shift is seen.  There is a slightly comminuted fracture of the left side of the nasal bone, and also a minimally comminuted fracture through the outer and inner tables of the left frontal sinus, with blood partially filling the left frontal sinus. The orbits are within normal limits. The remaining paranasal sinuses and mastoid air cells are well-aerated. Mild soft tissue swelling is noted overlying the right frontal calvarium.  CT MAXILLOFACIAL FINDINGS  There is a minimally comminuted fracture involving the left-sided nasal bone, with mild displacement. There are also minimally displaced fractures through the inner and outer tables of the left frontal sinus. The maxilla and mandible appear intact. There is chronic absence of the dentition.  The orbits are intact bilaterally. Blood is noted partially filling the left frontal sinus. Visualized paranasal sinuses and mastoid air cells are well-aerated.  Mild soft tissue swelling is noted overlying the right frontal calvarium. Soft tissue swelling is noted overlying the nose, with scattered soft tissue air on the left side of the nose. The parapharyngeal fat planes are preserved. The nasopharynx, oropharynx and hypopharynx are unremarkable in appearance.  The visualized portions of the valleculae and piriform sinuses are grossly unremarkable.  The parotid and submandibular glands are within normal limits. No cervical lymphadenopathy is seen.  CT CERVICAL SPINE FINDINGS  There is no evidence of acute fracture or subluxation. There is  grade 1 anterolisthesis of C2 on C3, reflecting underlying facet disease. Multilevel disc space narrowing is noted along the cervical spine, with scattered anterior and posterior disc osteophyte complexes. Vertebral bodies demonstrate normal height. Prevertebral soft tissues are within normal limits.  The visualized portions of the thyroid gland are unremarkable in appearance. Mild calcification is seen at the carotid bifurcations bilaterally.  IMPRESSION: 1. Small amount of acute subdural hemorrhage along the left side of the anterior falx cerebri, measuring up to 3 mm in thickness. 2. Slightly comminuted fracture of the left side of the nasal bone, with mild soft tissue swelling and mild soft tissue air. 3. Minimally comminuted fracture through the outer and inner tables of the left frontal sinus, with blood partially filling the left frontal sinus. 4. Mild soft tissue swelling overlying the right frontal calvarium. 5. No evidence of acute fracture or subluxation along the cervical spine. 6. Mild to moderate cortical volume loss and scattered small vessel ischemic microangiopathy. Small chronic lacunar infarcts in the cerebellar hemispheres bilaterally. 7. Grade 1 anterolisthesis of C2 on C3, reflecting underlying facet disease. 8. Mild degenerative change along the cervical spine. 9. Mild calcification at the carotid bifurcations bilaterally.  Critical Value/emergent results were called by telephone at the time of interpretation on 05/19/2014 at 8:53 pm to [facility] PA, who verbally acknowledged these results.   Electronically Signed   By: [name] M.D.   On: 05/19/2014 20:54   Ct Maxillofacial Wo Cm  05/19/2014   CLINICAL DATA:  Tripped and fell, with abrasions about the face. Question of loss of consciousness. Patient feeling dazed. Concern for maxillofacial or cervical spine injury. Initial encounter.  EXAM: CT HEAD WITHOUT CONTRAST  CT MAXILLOFACIAL WITHOUT CONTRAST  CT CERVICAL SPINE WITHOUT  CONTRAST  TECHNIQUE: Multidetector CT imaging of the head, cervical spine, and maxillofacial structures were performed using the standard protocol without intravenous contrast. Multiplanar CT image reconstructions of the cervical spine and maxillofacial structures were also generated.  COMPARISON:  None.  FINDINGS: CT HEAD FINDINGS  A small amount of acute subdural hemorrhage is noted along the left side of the anterior falx cerebri, measuring up to 3 mm in thickness.  Prominence of the ventricles and sulci reflects mild to moderate cortical volume loss. Mild cerebellar atrophy is noted. Scattered periventricular and subcortical white matter change likely reflects small vessel ischemic microangiopathy. Small chronic lacunar infarcts are seen in the cerebellar hemispheres bilaterally.  The brainstem and fourth ventricle are within normal limits. The basal ganglia are unremarkable in appearance. The cerebral hemispheres demonstrate grossly normal gray-white differentiation. No mass effect or midline shift is seen.  There is a slightly comminuted fracture of the left side of the nasal bone, and also a minimally comminuted fracture through the outer and inner tables of the left frontal sinus, with blood partially filling the left frontal sinus. The orbits are within normal limits. The remaining paranasal sinuses and mastoid air cells are well-aerated. Mild soft tissue swelling is noted overlying the right frontal calvarium.  CT MAXILLOFACIAL FINDINGS  There is a minimally comminuted fracture involving the left-sided nasal bone, with mild displacement. There are also minimally displaced fractures through the inner and outer tables of the left frontal sinus. The maxilla  and mandible appear intact. There is chronic absence of the dentition.  The orbits are intact bilaterally. Blood is noted partially filling the left frontal sinus. Visualized paranasal sinuses and mastoid air cells are well-aerated.  Mild soft tissue  swelling is noted overlying the right frontal calvarium. Soft tissue swelling is noted overlying the nose, with scattered soft tissue air on the left side of the nose. The parapharyngeal fat planes are preserved. The nasopharynx, oropharynx and hypopharynx are unremarkable in appearance. The visualized portions of the valleculae and piriform sinuses are grossly unremarkable.  The parotid and submandibular glands are within normal limits. No cervical lymphadenopathy is seen.  CT CERVICAL SPINE FINDINGS  There is no evidence of acute fracture or subluxation. There is grade 1 anterolisthesis of C2 on C3, reflecting underlying facet disease. Multilevel disc space narrowing is noted along the cervical spine, with scattered anterior and posterior disc osteophyte complexes. Vertebral bodies demonstrate normal height. Prevertebral soft tissues are within normal limits.  The visualized portions of the thyroid gland are unremarkable in appearance. Mild calcification is seen at the carotid bifurcations bilaterally.  IMPRESSION: 1. Small amount of acute subdural hemorrhage along the left side of the anterior falx cerebri, measuring up to 3 mm in thickness. 2. Slightly comminuted fracture of the left side of the nasal bone, with mild soft tissue swelling and mild soft tissue air. 3. Minimally comminuted fracture through the outer and inner tables of the left frontal sinus, with blood partially filling the left frontal sinus. 4. Mild soft tissue swelling overlying the right frontal calvarium. 5. No evidence of acute fracture or subluxation along the cervical spine. 6. Mild to moderate cortical volume loss and scattered small vessel ischemic microangiopathy. Small chronic lacunar infarcts in the cerebellar hemispheres bilaterally. 7. Grade 1 anterolisthesis of C2 on C3, reflecting underlying facet disease. 8. Mild degenerative change along the cervical spine. 9. Mild calcification at the carotid bifurcations bilaterally.  Critical  Value/emergent results were called by telephone at the time of interpretation on 05/19/2014 at 8:53 pm to [facility] PA, who verbally acknowledged these results.   Electronically Signed   By: [name] M.D.   On: 05/19/2014 20:54    [id number] except as listed in admit H&P  Blood pressure 148/59, pulse 60, temperature 98 F (36.7 C), temperature source Oral, resp. rate 16, height 6' (1.829 m), weight 105.643 kg (232 lb 14.4 oz), SpO2 96 %.  PHYSICAL EXAM: Overall appearance:  Healthy appearing, in no distress Head:  Normocephalic, abrasions and minor swelling around the nose and right eye.. Ears: External ears normal. Nose: External nose is solid and symmetric. Internal nasal exam free of any lesions or obstruction. Oral Cavity:  There are no trismus. Neuro:  No identifiable neurologic deficits. EOM's intact. Neck: No palpable neck masses.  Studies Reviewed: maxillofacial CT  Procedures: none   Assessment/Plan: Minimally displaced nasal and frontal sinus fractures. No need for surgical intervention. Follow up as out patient if needed.  Zinedine Ellner 05/20/2014, 7:57 AM

## 2014-05-20 NOTE — Plan of Care (Signed)
Problem: Phase I Progression Outcomes Goal: Pain controlled with appropriate interventions Outcome: Progressing Took Oxy IR x 1 during this shift

## 2014-05-20 NOTE — Plan of Care (Signed)
Problem: Phase I Progression Outcomes Goal: Voiding-avoid urinary catheter unless indicated Outcome: Completed/Met Date Met:  05/20/14 Urinating normally

## 2014-05-21 ENCOUNTER — Inpatient Hospital Stay (HOSPITAL_COMMUNITY): Payer: Medicare Other

## 2014-05-21 LAB — GLUCOSE, CAPILLARY: Glucose-Capillary: 153 mg/dL — ABNORMAL HIGH (ref 70–99)

## 2014-05-21 LAB — HEMOGLOBIN A1C
Hgb A1c MFr Bld: 6.7 % — ABNORMAL HIGH (ref 4.8–5.6)
Mean Plasma Glucose: 146 mg/dL

## 2014-05-21 MED ORDER — OXYCODONE HCL 5 MG PO TABS: 5.0000 mg | ORAL_TABLET | ORAL | Status: DC | PRN

## 2014-05-21 NOTE — Discharge Summary (Addendum)
Physician Discharge Summary  Vincent Allen MRN: [id number] DOB/AGE: 04/04/1933 81 y.o.  PCP: [name], MD   Admit date: 05/19/2014 Discharge date: 05/21/2014  Discharge Diagnoses:     Active Problems:   Hyperlipidemia   Chronic kidney disease stage III (GFR 30-59 ml/min)   Diabetes mellitus type 2 in obese   Fracture closed, nasal bone   Subdural hematoma   Nasal fracture   Acute subdural hematoma   Follow-up recommendations Follow-up with PCP in 5-7 days Do recommend follow-up CT scan of the head in 7-10 days, if CT scan stable can resume aspirin    Medication List    STOP taking these medications        ALEVE 220 MG Caps  Generic drug:  Naproxen Sodium     aspirin EC 81 MG tablet      TAKE these medications        amLODipine 5 MG tablet  Commonly known as:  NORVASC  TAKE 1 TABLET BY MOUTH EVERY DAY     atorvastatin 20 MG tablet  Commonly known as:  LIPITOR  TAKE 1 TABLET BY MOUTH DAILY     calcium carbonate 600 MG tablet  Commonly known as:  OS-CAL  Take 1 tablet (600 mg total) by mouth 2 (two) times daily.     cholecalciferol 400 UNITS Tabs tablet  Commonly known as:  VITAMIN D  Take 400 Units by mouth 2 (two) times daily.     glipiZIDE 10 MG tablet  Commonly known as:  GLUCOTROL  TAKE 2 TABLETS BY MOUTH TWICE A DAY BEFORE MEALS     lisinopril 5 MG tablet  Commonly known as:  PRINIVIL,ZESTRIL  TAKE 1 TABLET BY MOUTH EVERY DAY     metFORMIN 500 MG 24 hr tablet  Commonly known as:  GLUCOPHAGE-XR  Take 1 tablet (500 mg total) by mouth 2 (two) times daily.     ONE TOUCH ULTRA TEST test strip  Generic drug:  glucose blood  Use as directed. Test blood sugar twice daily as needed.     onetouch ultrasoft lancets  Use as instructed to check blood sugar twice a day as needed Dx Code 250.00     oxyCODONE 5 MG immediate release tablet  Commonly known as:  Oxy IR/ROXICODONE  Take 1 tablet (5 mg total) by mouth every 4 (four) hours as needed  for moderate pain.     sitaGLIPtin 100 MG tablet  Commonly known as:  JANUVIA  1/2 to 1 tab po daily     terbinafine 1 % cream  Commonly known as:  LAMISIL AT  Apply 1 application topically 2 (two) times daily. Tinea pedis     triamterene-hydrochlorothiazide 75-50 MG per tablet  Commonly known as:  MAXZIDE  TAKE 1 TABLET BY MOUTH DAILY.        Discharge Condition:  Disposition: 01-Home or Self Care   Consults:  Scherrie Merritts, MD    Significant Diagnostic Studies: Dg Shoulder Right  05/19/2014   CLINICAL DATA:  Fall today with shoulder pain, initial encounter  EXAM: RIGHT SHOULDER - 2+ VIEW  COMPARISON:  05/06/2005  FINDINGS: Postsurgical changes are noted. Degenerative changes of the glenohumeral articulation as well as the acromioclavicular joint are seen. Humeral head is high-riding which may be indicative of a chronic rotator cuff injury. No acute fracture is seen. The underlying bony thorax is within normal limits.  IMPRESSION: Chronic and postsurgical changes.  No acute abnormality is noted.   Electronically  Signed   By: Inez Catalina M.D.   On: 05/19/2014 22:03   Ct Head Wo Contrast  05/21/2014   CLINICAL DATA:  Patient with subdural hematoma. , followup evaluation.  EXAM: CT HEAD WITHOUT CONTRAST  TECHNIQUE: Contiguous axial images were obtained from the base of the skull through the vertex without intravenous contrast.  COMPARISON:  05/19/2014  FINDINGS: No significant interval change in size of approximate 3 mm subdural hematoma along the left side of the anterior falx. Ventricles and sulci are prominent, compatible with atrophy. Periventricular and subcortical white matter hypodensity compatible with chronic small vessel ischemic change. No evidence for significant mass effect or acute cortically based infarct. 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 Re- demonstrated mildly comminuted fracture involving left aspect of the nasal bone.  Additionally there are minimally displaced fractures to the inner and outer table of the left frontal sinus. Unchanged small amount of blood products within the left aspect of the frontal sinus.  IMPRESSION: 1. No significant interval change in acute subdural hemorrhage along the left side of the anterior falx, measuring 3 mm in thickness. 2. Unchanged comminuted left side nasal bone fracture. 3. Re- demonstrated fractures to the inner and outer table of the left frontal sinus with blood products involving the left aspect of the frontal sinus.   Electronically Signed   By: Lovey Newcomer M.D.   On: 05/21/2014 07:34   Ct Head Wo Contrast  05/19/2014   CLINICAL DATA:  Tripped and fell, with abrasions about the face. Question of loss of consciousness. Patient feeling dazed. Concern for maxillofacial or cervical spine injury. Initial encounter.  EXAM: CT HEAD WITHOUT CONTRAST  CT MAXILLOFACIAL WITHOUT CONTRAST  CT CERVICAL SPINE WITHOUT CONTRAST  TECHNIQUE: Multidetector CT imaging of the head, cervical spine, and maxillofacial structures were performed using the standard protocol without intravenous contrast. Multiplanar CT image reconstructions of the cervical spine and maxillofacial structures were also generated.  COMPARISON:  None.  FINDINGS: CT HEAD FINDINGS  A small amount of acute subdural hemorrhage is noted along the left side of the anterior falx cerebri, measuring up to 3 mm in thickness.  Prominence of the ventricles and sulci reflects mild to moderate cortical volume loss. Mild cerebellar atrophy is noted. Scattered periventricular and subcortical white matter change likely reflects small vessel ischemic microangiopathy. Small chronic lacunar infarcts are seen in the cerebellar hemispheres bilaterally.  The brainstem and fourth ventricle are within normal limits. The basal ganglia are unremarkable in appearance. The cerebral hemispheres demonstrate grossly normal gray-white differentiation. No mass effect  or midline shift is seen.  There is a slightly comminuted fracture of the left side of the nasal bone, and also a minimally comminuted fracture through the outer and inner tables of the left frontal sinus, with blood partially filling the left frontal sinus. The orbits are within normal limits. The remaining paranasal sinuses and mastoid air cells are well-aerated. Mild soft tissue swelling is noted overlying the right frontal calvarium.  CT MAXILLOFACIAL FINDINGS  There is a minimally comminuted fracture involving the left-sided nasal bone, with mild displacement. There are also minimally displaced fractures through the inner and outer tables of the left frontal sinus. The maxilla and mandible appear intact. There is chronic absence of the dentition.  The orbits are intact bilaterally. Blood is noted partially filling the left frontal sinus. Visualized paranasal sinuses and mastoid air cells are well-aerated.  Mild soft tissue swelling is noted overlying the right frontal calvarium. Soft tissue  swelling is noted overlying the nose, with scattered soft tissue air on the left side of the nose. The parapharyngeal fat planes are preserved. The nasopharynx, oropharynx and hypopharynx are unremarkable in appearance. The visualized portions of the valleculae and piriform sinuses are grossly unremarkable.  The parotid and submandibular glands are within normal limits. No cervical lymphadenopathy is seen.  CT CERVICAL SPINE FINDINGS  There is no evidence of acute fracture or subluxation. There is grade 1 anterolisthesis of C2 on C3, reflecting underlying facet disease. Multilevel disc space narrowing is noted along the cervical spine, with scattered anterior and posterior disc osteophyte complexes. Vertebral bodies demonstrate normal height. Prevertebral soft tissues are within normal limits.  The visualized portions of the thyroid gland are unremarkable in appearance. Mild calcification is seen at the carotid bifurcations  bilaterally.  IMPRESSION: 1. Small amount of acute subdural hemorrhage along the left side of the anterior falx cerebri, measuring up to 3 mm in thickness. 2. Slightly comminuted fracture of the left side of the nasal bone, with mild soft tissue swelling and mild soft tissue air. 3. Minimally comminuted fracture through the outer and inner tables of the left frontal sinus, with blood partially filling the left frontal sinus. 4. Mild soft tissue swelling overlying the right frontal calvarium. 5. No evidence of acute fracture or subluxation along the cervical spine. 6. Mild to moderate cortical volume loss and scattered small vessel ischemic microangiopathy. Small chronic lacunar infarcts in the cerebellar hemispheres bilaterally. 7. Grade 1 anterolisthesis of C2 on C3, reflecting underlying facet disease. 8. Mild degenerative change along the cervical spine. 9. Mild calcification at the carotid bifurcations bilaterally.  Critical Value/emergent results were called by telephone at the time of interpretation on 05/19/2014 at 8:53 pm to [facility], who verbally acknowledged these results.   Electronically Signed   By: [name] M.D.   On: 05/19/2014 20:54   Ct Cervical Spine Wo Contrast  05/19/2014   CLINICAL DATA:  Tripped and fell, with abrasions about the face. Question of loss of consciousness. Patient feeling dazed. Concern for maxillofacial or cervical spine injury. Initial encounter.  EXAM: CT HEAD WITHOUT CONTRAST  CT MAXILLOFACIAL WITHOUT CONTRAST  CT CERVICAL SPINE WITHOUT CONTRAST  TECHNIQUE: Multidetector CT imaging of the head, cervical spine, and maxillofacial structures were performed using the standard protocol without intravenous contrast. Multiplanar CT image reconstructions of the cervical spine and maxillofacial structures were also generated.  COMPARISON:  None.  FINDINGS: CT HEAD FINDINGS  A small amount of acute subdural hemorrhage is noted along the left side of the anterior falx  cerebri, measuring up to 3 mm in thickness.  Prominence of the ventricles and sulci reflects mild to moderate cortical volume loss. Mild cerebellar atrophy is noted. Scattered periventricular and subcortical white matter change likely reflects small vessel ischemic microangiopathy. Small chronic lacunar infarcts are seen in the cerebellar hemispheres bilaterally.  The brainstem and fourth ventricle are within normal limits. The basal ganglia are unremarkable in appearance. The cerebral hemispheres demonstrate grossly normal gray-white differentiation. No mass effect or midline shift is seen.  There is a slightly comminuted fracture of the left side of the nasal bone, and also a minimally comminuted fracture through the outer and inner tables of the left frontal sinus, with blood partially filling the left frontal sinus. The orbits are within normal limits. The remaining paranasal sinuses and mastoid air cells are well-aerated. Mild soft tissue swelling is noted overlying the right frontal calvarium.  CT MAXILLOFACIAL FINDINGS  There is a minimally comminuted fracture involving the left-sided nasal bone, with mild displacement. There are also minimally displaced fractures through the inner and outer tables of the left frontal sinus. The maxilla and mandible appear intact. There is chronic absence of the dentition.  The orbits are intact bilaterally. Blood is noted partially filling the left frontal sinus. Visualized paranasal sinuses and mastoid air cells are well-aerated.  Mild soft tissue swelling is noted overlying the right frontal calvarium. Soft tissue swelling is noted overlying the nose, with scattered soft tissue air on the left side of the nose. The parapharyngeal fat planes are preserved. The nasopharynx, oropharynx and hypopharynx are unremarkable in appearance. The visualized portions of the valleculae and piriform sinuses are grossly unremarkable.  The parotid and submandibular glands are within normal  limits. No cervical lymphadenopathy is seen.  CT CERVICAL SPINE FINDINGS  There is no evidence of acute fracture or subluxation. There is grade 1 anterolisthesis of C2 on C3, reflecting underlying facet disease. Multilevel disc space narrowing is noted along the cervical spine, with scattered anterior and posterior disc osteophyte complexes. Vertebral bodies demonstrate normal height. Prevertebral soft tissues are within normal limits.  The visualized portions of the thyroid gland are unremarkable in appearance. Mild calcification is seen at the carotid bifurcations bilaterally.  IMPRESSION: 1. Small amount of acute subdural hemorrhage along the left side of the anterior falx cerebri, measuring up to 3 mm in thickness. 2. Slightly comminuted fracture of the left side of the nasal bone, with mild soft tissue swelling and mild soft tissue air. 3. Minimally comminuted fracture through the outer and inner tables of the left frontal sinus, with blood partially filling the left frontal sinus. 4. Mild soft tissue swelling overlying the right frontal calvarium. 5. No evidence of acute fracture or subluxation along the cervical spine. 6. Mild to moderate cortical volume loss and scattered small vessel ischemic microangiopathy. Small chronic lacunar infarcts in the cerebellar hemispheres bilaterally. 7. Grade 1 anterolisthesis of C2 on C3, reflecting underlying facet disease. 8. Mild degenerative change along the cervical spine. 9. Mild calcification at the carotid bifurcations bilaterally.  Critical Value/emergent results were called by telephone at the time of interpretation on 05/19/2014 at 8:53 pm to [facility], who verbally acknowledged these results.   Electronically Signed   By: [name] M.D.   On: 05/19/2014 20:54   Ct Maxillofacial Wo Cm  05/19/2014   CLINICAL DATA:  Tripped and fell, with abrasions about the face. Question of loss of consciousness. Patient feeling dazed. Concern for maxillofacial or  cervical spine injury. Initial encounter.  EXAM: CT HEAD WITHOUT CONTRAST  CT MAXILLOFACIAL WITHOUT CONTRAST  CT CERVICAL SPINE WITHOUT CONTRAST  TECHNIQUE: Multidetector CT imaging of the head, cervical spine, and maxillofacial structures were performed using the standard protocol without intravenous contrast. Multiplanar CT image reconstructions of the cervical spine and maxillofacial structures were also generated.  COMPARISON:  None.  FINDINGS: CT HEAD FINDINGS  A small amount of acute subdural hemorrhage is noted along the left side of the anterior falx cerebri, measuring up to 3 mm in thickness.  Prominence of the ventricles and sulci reflects mild to moderate cortical volume loss. Mild cerebellar atrophy is noted. Scattered periventricular and subcortical white matter change likely reflects small vessel ischemic microangiopathy. Small chronic lacunar infarcts are seen in the cerebellar hemispheres bilaterally.  The brainstem and fourth ventricle are within normal limits. The basal ganglia are unremarkable in appearance. The cerebral hemispheres demonstrate grossly normal  gray-white differentiation. No mass effect or midline shift is seen.  There is a slightly comminuted fracture of the left side of the nasal bone, and also a minimally comminuted fracture through the outer and inner tables of the left frontal sinus, with blood partially filling the left frontal sinus. The orbits are within normal limits. The remaining paranasal sinuses and mastoid air cells are well-aerated. Mild soft tissue swelling is noted overlying the right frontal calvarium.  CT MAXILLOFACIAL FINDINGS  There is a minimally comminuted fracture involving the left-sided nasal bone, with mild displacement. There are also minimally displaced fractures through the inner and outer tables of the left frontal sinus. The maxilla and mandible appear intact. There is chronic absence of the dentition.  The orbits are intact bilaterally. Blood is  noted partially filling the left frontal sinus. Visualized paranasal sinuses and mastoid air cells are well-aerated.  Mild soft tissue swelling is noted overlying the right frontal calvarium. Soft tissue swelling is noted overlying the nose, with scattered soft tissue air on the left side of the nose. The parapharyngeal fat planes are preserved. The nasopharynx, oropharynx and hypopharynx are unremarkable in appearance. The visualized portions of the valleculae and piriform sinuses are grossly unremarkable.  The parotid and submandibular glands are within normal limits. No cervical lymphadenopathy is seen.  CT CERVICAL SPINE FINDINGS  There is no evidence of acute fracture or subluxation. There is grade 1 anterolisthesis of C2 on C3, reflecting underlying facet disease. Multilevel disc space narrowing is noted along the cervical spine, with scattered anterior and posterior disc osteophyte complexes. Vertebral bodies demonstrate normal height. Prevertebral soft tissues are within normal limits.  The visualized portions of the thyroid gland are unremarkable in appearance. Mild calcification is seen at the carotid bifurcations bilaterally.  IMPRESSION: 1. Small amount of acute subdural hemorrhage along the left side of the anterior falx cerebri, measuring up to 3 mm in thickness. 2. Slightly comminuted fracture of the left side of the nasal bone, with mild soft tissue swelling and mild soft tissue air. 3. Minimally comminuted fracture through the outer and inner tables of the left frontal sinus, with blood partially filling the left frontal sinus. 4. Mild soft tissue swelling overlying the right frontal calvarium. 5. No evidence of acute fracture or subluxation along the cervical spine. 6. Mild to moderate cortical volume loss and scattered small vessel ischemic microangiopathy. Small chronic lacunar infarcts in the cerebellar hemispheres bilaterally. 7. Grade 1 anterolisthesis of C2 on C3, reflecting underlying facet  disease. 8. Mild degenerative change along the cervical spine. 9. Mild calcification at the carotid bifurcations bilaterally.  Critical Value/emergent results were called by telephone at the time of interpretation on 05/19/2014 at 8:53 pm to [facility], who verbally acknowledged these results.   Electronically Signed   By: [name] M.D.   On: 05/19/2014 20:54       Microbiology: No results found for this or any previous visit (from the past 240 hour(s)).   Labs: Results for orders placed or performed during the hospital encounter of 05/19/14 (from the past 48 hour(s))  Urinalysis, Routine w reflex microscopic     Status: None   Collection Time: 05/19/14  9:17 PM  Result Value Ref Range   Color, Urine YELLOW YELLOW   APPearance CLEAR CLEAR   Specific Gravity, Urine 1.013 1.005 - 1.030   pH 5.0 5.0 - 8.0   Glucose, UA NEGATIVE NEGATIVE mg/dL   Hgb urine dipstick NEGATIVE NEGATIVE   Bilirubin  Urine NEGATIVE NEGATIVE   Ketones, ur NEGATIVE NEGATIVE mg/dL   Protein, ur NEGATIVE NEGATIVE mg/dL   Urobilinogen, UA 0.2 0.0 - 1.0 mg/dL   Nitrite NEGATIVE NEGATIVE   Leukocytes, UA NEGATIVE NEGATIVE    Comment: MICROSCOPIC NOT DONE ON URINES WITH NEGATIVE PROTEIN, BLOOD, LEUKOCYTES, NITRITE, OR GLUCOSE <1000 mg/dL.  CBC with Differential     Status: None   Collection Time: 05/19/14  9:25 PM  Result Value Ref Range   WBC 10.0 4.0 - 10.5 K/uL   RBC 5.32 4.22 - 5.81 MIL/uL   Hemoglobin 15.0 13.0 - 17.0 g/dL   HCT 44.3 39.0 - 52.0 %   MCV 83.3 78.0 - 100.0 fL   MCH 28.2 26.0 - 34.0 pg   MCHC 33.9 30.0 - 36.0 g/dL   RDW 13.5 11.5 - 15.5 %   Platelets 184 150 - 400 K/uL   Neutrophils Relative % 76 43 - 77 %   Neutro Abs 7.7 1.7 - 7.7 K/uL   Lymphocytes Relative 13 12 - 46 %   Lymphs Abs 1.3 0.7 - 4.0 K/uL   Monocytes Relative 9 3 - 12 %   Monocytes Absolute 0.9 0.1 - 1.0 K/uL   Eosinophils Relative 2 0 - 5 %   Eosinophils Absolute 0.2 0.0 - 0.7 K/uL   Basophils Relative 0 0 -  1 %   Basophils Absolute 0.0 0.0 - 0.1 K/uL  Basic metabolic panel     Status: Abnormal   Collection Time: 05/19/14  9:25 PM  Result Value Ref Range   Sodium 135 135 - 145 mmol/L   Potassium 4.7 3.5 - 5.1 mmol/L   Chloride 108 96 - 112 mmol/L   CO2 22 19 - 32 mmol/L   Glucose, Bld 179 (H) 70 - 99 mg/dL   BUN 19 6 - 23 mg/dL   Creatinine, Ser 1.35 0.50 - 1.35 mg/dL   Calcium 9.0 8.4 - 10.5 mg/dL   GFR calc non Af Amer 49 (L) >90 mL/min   GFR calc Af Amer 56 (L) >90 mL/min    Comment: (NOTE) The eGFR has been calculated using the CKD EPI equation. This calculation has not been validated in all clinical situations. eGFR's persistently <90 mL/min signify possible Chronic Kidney Disease.    Anion gap 5 5 - 15  I-stat troponin, ED     Status: None   Collection Time: 05/19/14  9:32 PM  Result Value Ref Range   Troponin i, poc 0.02 0.00 - 0.08 ng/mL   Comment 3            Comment: Due to the release kinetics of cTnI, a negative result within the first hours of the onset of symptoms does not rule out myocardial infarction with certainty. If myocardial infarction is still suspected, repeat the test at appropriate intervals.   TSH     Status: None   Collection Time: 05/20/14  1:40 AM  Result Value Ref Range   TSH 2.971 0.350 - 4.500 uIU/mL  Hemoglobin A1c     Status: Abnormal   Collection Time: 05/20/14  1:40 AM  Result Value Ref Range   Hgb A1c MFr Bld 6.7 (H) 4.8 - 5.6 %    Comment: (NOTE)         Pre-diabetes: 5.7 - 6.4         Diabetes: >6.4         Glycemic control for adults with diabetes: <7.0    Mean Plasma Glucose 146 mg/dL  Comment: (NOTE) Performed At: [facility] [place], [place] [id number] Lindon Romp MD LA:[id number]   Comprehensive metabolic panel     Status: Abnormal   Collection Time: 05/20/14  1:40 AM  Result Value Ref Range   Sodium 137 135 - 145 mmol/L   Potassium 4.1 3.5 - 5.1 mmol/L   Chloride 110 96 - 112 mmol/L    CO2 21 19 - 32 mmol/L   Glucose, Bld 146 (H) 70 - 99 mg/dL   BUN 19 6 - 23 mg/dL   Creatinine, Ser 1.24 0.50 - 1.35 mg/dL   Calcium 8.5 8.4 - 10.5 mg/dL   Total Protein 5.9 (L) 6.0 - 8.3 g/dL   Albumin 3.1 (L) 3.5 - 5.2 g/dL   AST 18 0 - 37 U/L   ALT 17 0 - 53 U/L   Alkaline Phosphatase 49 39 - 117 U/L   Total Bilirubin 0.7 0.3 - 1.2 mg/dL   GFR calc non Af Amer 54 (L) >90 mL/min   GFR calc Af Amer 62 (L) >90 mL/min    Comment: (NOTE) The eGFR has been calculated using the CKD EPI equation. This calculation has not been validated in all clinical situations. eGFR's persistently <90 mL/min signify possible Chronic Kidney Disease.    Anion gap 6 5 - 15  CBC     Status: None   Collection Time: 05/20/14  1:40 AM  Result Value Ref Range   WBC 8.5 4.0 - 10.5 K/uL   RBC 4.73 4.22 - 5.81 MIL/uL   Hemoglobin 13.0 13.0 - 17.0 g/dL   HCT 39.4 39.0 - 52.0 %   MCV 83.3 78.0 - 100.0 fL   MCH 27.5 26.0 - 34.0 pg   MCHC 33.0 30.0 - 36.0 g/dL   RDW 13.5 11.5 - 15.5 %   Platelets 171 150 - 400 K/uL  Glucose, capillary     Status: Abnormal   Collection Time: 05/20/14  7:35 AM  Result Value Ref Range   Glucose-Capillary 130 (H) 70 - 99 mg/dL  Glucose, capillary     Status: Abnormal   Collection Time: 05/20/14 12:13 PM  Result Value Ref Range   Glucose-Capillary 148 (H) 70 - 99 mg/dL  Glucose, capillary     Status: Abnormal   Collection Time: 05/20/14  4:30 PM  Result Value Ref Range   Glucose-Capillary 160 (H) 70 - 99 mg/dL  Glucose, capillary     Status: Abnormal   Collection Time: 05/20/14  9:45 PM  Result Value Ref Range   Glucose-Capillary 181 (H) 70 - 99 mg/dL   Comment 1 Notify RN      HPI :Vincent Allen is a 81 y.o. male presents after a fall. Patient states he was walking in his hallway and tripped over his rug. Patient fell on his face and right side. He states he started to have a nosebleed. He states he was a little dazed but did not lose consciousness. Patient states  that he called his son to get him. In the ED he had a evaluation with CT head and he was noted to have a subdural hematoma as well as nasal fractures. Neurosurgery was called and they suggested observation over night. He had been having a headache and it is better. No other complaints  HOSPITAL COURSE: acute subdural hemorrhage along the left side measuring up to 3 mm in thickness DC asa,  Avoid heparin Repeat CT scan on 3/5 prior to discharge shows No significant interval change  in acute subdural hemorrhage along the left side of the anterior falx, measuring 3 mm in thickness. Recommend repeat CT scan in 7-10 days if stable can resume aspirin   Minimally displaced nasal and frontal sinus fractures. No need for surgical intervention. Follow up as out patient if needed Pain control with oxycodone   HTN  Cont norvasc and lisinopril Stable   DM type 2  Cont metformin and glipizide Cont tradejenta  Accu-Chek stable during this hospitalization  CKD STAGE 3 Cr stable  Last creatinine 1.24 on 3/4   Discharge Exam:    Blood pressure 150/59, pulse 64, temperature 98.4 F (36.9 C), temperature source Oral, resp. rate 18, height 6' (1.829 m), weight 105.643 kg (232 lb 14.4 oz), SpO2 97 %.   General:Multiple facial abrasions over the nose and right brow. There is a small 2 cm hematoma on the medial right brow Lungs: Clear to auscultation bilaterally without wheezes or crackles Cardiovascular: Regular rate and rhythm without murmur gallop or rub normal S1 and S2 Abdomen: Nontender, nondistended, soft, bowel sounds positive, no rebound, no ascites, no appreciable mass Extremities: No significant cyanosis, clubbing, or edema bilateral lower extremities      Discharge Instructions    Diet - low sodium heart healthy    Complete by:  As directed      Diet - low sodium heart healthy    Complete by:  As directed      Increase activity slowly    Complete by:  As directed      Increase  activity slowly    Complete by:  As directed            Follow-up Information    Follow up with [name], MD. Schedule an appointment as soon as possible for a visit in 3 days.   Specialty:  Family Medicine   Why:  pcp PLEASE ARRANGE FOR FOLLOW UP CT SCAN IN 1 WEEK    Contact information:   [address] [place] [address] [phone]       Signed: Reyne Dumas 05/21/2014, 10:09 AM

## 2014-05-22 ENCOUNTER — Encounter (HOSPITAL_COMMUNITY): Payer: Self-pay | Admitting: *Deleted

## 2014-05-22 DIAGNOSIS — R531 Weakness: Secondary | ICD-10-CM | POA: Diagnosis present

## 2014-05-22 DIAGNOSIS — Z880 Allergy status to penicillin: Secondary | ICD-10-CM | POA: Insufficient documentation

## 2014-05-22 DIAGNOSIS — I129 Hypertensive chronic kidney disease with stage 1 through stage 4 chronic kidney disease, or unspecified chronic kidney disease: Secondary | ICD-10-CM | POA: Diagnosis not present

## 2014-05-22 DIAGNOSIS — R2242 Localized swelling, mass and lump, left lower limb: Secondary | ICD-10-CM | POA: Diagnosis not present

## 2014-05-22 DIAGNOSIS — G8929 Other chronic pain: Secondary | ICD-10-CM | POA: Insufficient documentation

## 2014-05-22 DIAGNOSIS — Z86010 Personal history of colonic polyps: Secondary | ICD-10-CM | POA: Diagnosis not present

## 2014-05-22 DIAGNOSIS — M6281 Muscle weakness (generalized): Secondary | ICD-10-CM | POA: Insufficient documentation

## 2014-05-22 DIAGNOSIS — K219 Gastro-esophageal reflux disease without esophagitis: Secondary | ICD-10-CM | POA: Insufficient documentation

## 2014-05-22 DIAGNOSIS — N183 Chronic kidney disease, stage 3 (moderate): Secondary | ICD-10-CM | POA: Diagnosis not present

## 2014-05-22 DIAGNOSIS — Z791 Long term (current) use of non-steroidal anti-inflammatories (NSAID): Secondary | ICD-10-CM | POA: Insufficient documentation

## 2014-05-22 DIAGNOSIS — E785 Hyperlipidemia, unspecified: Secondary | ICD-10-CM | POA: Diagnosis not present

## 2014-05-22 NOTE — ED Notes (Signed)
The pt is c/o weakness in his rt arm and lt leg since this am.  His grip is weaker than it was.  He was just released from this hosp yesterday   He was admitted Thursdayfrom a fall .  He had a small head bleed according to the family

## 2014-05-23 ENCOUNTER — Emergency Department (HOSPITAL_COMMUNITY): Payer: Medicare Other

## 2014-05-23 ENCOUNTER — Telehealth: Payer: Self-pay

## 2014-05-23 ENCOUNTER — Emergency Department (HOSPITAL_COMMUNITY)
Admission: EM | Admit: 2014-05-23 | Discharge: 2014-05-23 | Disposition: A | Discharge status: Home / Self Care | Payer: Medicare Other | Attending: Emergency Medicine | Admitting: Emergency Medicine

## 2014-05-23 DIAGNOSIS — M79609 Pain in unspecified limb: Secondary | ICD-10-CM

## 2014-05-23 DIAGNOSIS — R29898 Other symptoms and signs involving the musculoskeletal system: Secondary | ICD-10-CM

## 2014-05-23 DIAGNOSIS — M7989 Other specified soft tissue disorders: Secondary | ICD-10-CM

## 2014-05-23 LAB — CBC
HEMATOCRIT: 37.8 % — AB (ref 39.0–52.0)
Hemoglobin: 12.5 g/dL — ABNORMAL LOW (ref 13.0–17.0)
MCH: 27.7 pg (ref 26.0–34.0)
MCHC: 33.1 g/dL (ref 30.0–36.0)
MCV: 83.6 fL (ref 78.0–100.0)
Platelets: 172 10*3/uL (ref 150–400)
RBC: 4.52 MIL/uL (ref 4.22–5.81)
RDW: 13.2 % (ref 11.5–15.5)
WBC: 9.9 10*3/uL (ref 4.0–10.5)

## 2014-05-23 LAB — I-STAT TROPONIN, ED: Troponin i, poc: 0 ng/mL (ref 0.00–0.08)

## 2014-05-23 LAB — RAPID URINE DRUG SCREEN, HOSP PERFORMED
AMPHETAMINES: NOT DETECTED
Barbiturates: NOT DETECTED
Benzodiazepines: NOT DETECTED
Cocaine: NOT DETECTED
OPIATES: NOT DETECTED
Tetrahydrocannabinol: NOT DETECTED

## 2014-05-23 LAB — COMPREHENSIVE METABOLIC PANEL
ALK PHOS: 48 U/L (ref 39–117)
ALT: 17 U/L (ref 0–53)
AST: 16 U/L (ref 0–37)
Albumin: 3 g/dL — ABNORMAL LOW (ref 3.5–5.2)
Anion gap: 9 (ref 5–15)
BILIRUBIN TOTAL: 1.2 mg/dL (ref 0.3–1.2)
BUN: 28 mg/dL — AB (ref 6–23)
CHLORIDE: 105 mmol/L (ref 96–112)
CO2: 18 mmol/L — ABNORMAL LOW (ref 19–32)
Calcium: 8.2 mg/dL — ABNORMAL LOW (ref 8.4–10.5)
Creatinine, Ser: 1.67 mg/dL — ABNORMAL HIGH (ref 0.50–1.35)
GFR, EST AFRICAN AMERICAN: 44 mL/min — AB (ref >90)
GFR, EST NON AFRICAN AMERICAN: 38 mL/min — AB (ref >90)
Glucose, Bld: 173 mg/dL — ABNORMAL HIGH (ref 70–99)
Potassium: 4.5 mmol/L (ref 3.5–5.1)
Sodium: 132 mmol/L — ABNORMAL LOW (ref 135–145)
TOTAL PROTEIN: 6 g/dL (ref 6.0–8.3)

## 2014-05-23 LAB — APTT: aPTT: 40 seconds — ABNORMAL HIGH (ref 24–37)

## 2014-05-23 LAB — DIFFERENTIAL
Basophils Absolute: 0 10*3/uL (ref 0.0–0.1)
Basophils Relative: 0 % (ref 0–1)
Eosinophils Absolute: 0.1 10*3/uL (ref 0.0–0.7)
Eosinophils Relative: 1 % (ref 0–5)
Lymphocytes Relative: 16 % (ref 12–46)
Lymphs Abs: 1.6 10*3/uL (ref 0.7–4.0)
MONO ABS: 1.6 10*3/uL — AB (ref 0.1–1.0)
Monocytes Relative: 17 % — ABNORMAL HIGH (ref 3–12)
NEUTROS ABS: 6.6 10*3/uL (ref 1.7–7.7)
Neutrophils Relative %: 66 % (ref 43–77)

## 2014-05-23 LAB — PROTIME-INR
INR: 1.21 (ref 0.00–1.49)
PROTHROMBIN TIME: 15.4 s — AB (ref 11.6–15.2)

## 2014-05-23 LAB — URINALYSIS, ROUTINE W REFLEX MICROSCOPIC
BILIRUBIN URINE: NEGATIVE
Glucose, UA: NEGATIVE mg/dL
Hgb urine dipstick: NEGATIVE
KETONES UR: NEGATIVE mg/dL
Leukocytes, UA: NEGATIVE
NITRITE: NEGATIVE
PROTEIN: NEGATIVE mg/dL
SPECIFIC GRAVITY, URINE: 1.018 (ref 1.005–1.030)
Urobilinogen, UA: 0.2 mg/dL (ref 0.0–1.0)
pH: 5 (ref 5.0–8.0)

## 2014-05-23 NOTE — ED Notes (Signed)
PT returned to room. Pt monitored by pulse ox, bp cuff, and 5-lead.

## 2014-05-23 NOTE — ED Notes (Signed)
The pt is still in mri

## 2014-05-23 NOTE — ED Notes (Signed)
Patient presents stating about noon Sunday he started with left knee pain and weakness to his right arm.  States he was unable to use the lever to put the leg rest down on his chair.  Family member states that he has been getting around slowly.  Came to the hospital last Thursday for a fall and is bruised from the fall.

## 2014-05-23 NOTE — ED Notes (Signed)
Pt up walking in the hallway with assistance  Tolerated well.   C/o lt knee still  Going to xray

## 2014-05-23 NOTE — ED Provider Notes (Signed)
Pt informed of all results No DVT  Declines meds for home, F/u this week - pt in agreement.  Johnna Acosta, MD 05/23/14 [phone]

## 2014-05-23 NOTE — ED Notes (Signed)
The pt was discussed with dr Eulis Foster he ordered a c-t scan

## 2014-05-23 NOTE — ED Notes (Signed)
The pt just returned from mri alert no pain

## 2014-05-23 NOTE — Discharge Instructions (Signed)
See your doctor this week for close follow up.

## 2014-05-23 NOTE — ED Notes (Signed)
Patient transported to CT 

## 2014-05-23 NOTE — Progress Notes (Signed)
VASCULAR LAB PRELIMINARY  PRELIMINARY  PRELIMINARY  PRELIMINARY  Left lower extremity venous Doppler completed.    Preliminary report:  There is no DVT or SVT noted in the left lower extremity.   Brenner Visconti, RVT 05/23/2014, 7:56 AM

## 2014-05-23 NOTE — ED Notes (Signed)
To mri 

## 2014-05-23 NOTE — Telephone Encounter (Signed)
Admit date: 05/19/2014 Discharge date: 05/21/2014  Reason for admission:  Fall  Follow-up recommendations Follow-up with PCP in 5-7 days Do recommend follow-up CT scan of the head in 7-10 days, if CT scan stable can resume aspirin  Transition Care Management Follow-up Telephone Call  How have you been since you were released from the hospital? Pt states he's "making it.". He did return back to the ER today right arm weakness.  States arm aches and he can barely move it.  Arm was assessed for DVT.  Result: negative.  He is experiencing some pain still, but continues to deny headaches.     Do you understand why you were in the hospital? yes   Do you understand the discharge instructions? yes  Items Reviewed:  Medications reviewed: yes  Allergies reviewed: yes  Dietary changes reviewed: yes  Referrals reviewed: n/a    Functional Questionnaire:  Activities of Daily Living (ADLs):     Pt is ambulating with a cane.     Any transportation issues/concerns?: no   Any patient concerns? no   Confirmed importance and date/time of follow-up visits scheduled: yes   Confirmed with patient if condition begins to worsen call PCP or go to the ER: yes   Hospital follow up appointment scheduled with Dr. [name] tomorrow (05/24/14) at 1:30 pm.

## 2014-05-23 NOTE — ED Provider Notes (Signed)
CSN: [id number]     Arrival date & time 05/22/14  2337 History   First MD Initiated Contact with Patient 05/22/14 2340     This chart was scribed for Merryl Hacker, MD by Forrestine Him, ED Scribe. This patient was seen in room A01C/A01C and the patient's care was started 1:29 AM.   Chief Complaint  Patient presents with  . Weakness   The history is provided by the patient. No language interpreter was used.    HPI Comments: Vincent Allen is a 81 y.o. male with a PMHx of GERD, DM, HTN, hyperlipidemia, and chronic kidney disease stage III who presents to the Emergency Department complaining of constant R arm weakness onset 12 PM this afternoon.  Pt was recently admitted to the hospital 3/3 after a fall and discharged home yesterday. CT of head without contrast performed which showed a small brain bleed. No new falls or trauma since last visit. No recent HA since discharge. Last dose of prescribed pain medication and OTC Aleve taken at 10:30 PM this evening which helped alleviate discomfort in left knee. No recent fever, chills, abdominal pain, CP, or SOB. Mr. Stmarie typically ambulates independently without assistance. However, he has been using a walker to help with ambulation since recent discharge. Pt with known allergies to Penicillins, Verapamil, and Influenza vaccines.  Past Medical History  Diagnosis Date  . Cataract   . GERD (gastroesophageal reflux disease)   . Diabetes mellitus   . Hypertension   . Hyperlipidemia   . Colon polyps   . Chronic kidney disease stage III (GFR 30-59 ml/min) 05/03/2012  . Irregular heartbeat   . HTN (hypertension) 01/13/2013  . Preventative health care 01/17/2013  . Medicare annual wellness visit, subsequent 01/17/2013   Past Surgical History  Procedure Laterality Date  . Cataract extraction    . Rotator cuff repair    . Ankle arthroplasty    . Tonsillectomy    . Cholecystectomy    . Cholecystectomy N/A 05/03/2012    Procedure: LAPAROSCOPIC  CHOLECYSTECTOMY WITH INTRAOPERATIVE CHOLANGIOGRAM;  Surgeon: [name], MD;  Location: [facility];  Service: General;  Laterality: N/A;  . Cataract extraction  02/28/14   Family History  Problem Relation Age of Onset  . Cancer Mother     colon, breast  . Heart disease Father   . Stroke Father   . Cancer Paternal Grandmother     colon   History  Substance Use Topics  . Smoking status: Never Smoker   . Smokeless tobacco: Never Used  . Alcohol Use: No    Review of Systems  Constitutional: Negative for fever and chills.  Respiratory: Negative.  Negative for chest tightness and shortness of breath.   Cardiovascular: Negative.  Negative for chest pain.  Gastrointestinal: Negative.  Negative for abdominal pain.  Genitourinary: Negative.  Negative for dysuria.  Musculoskeletal: Positive for arthralgias. Negative for back pain and neck pain.       Left knee pain, right shoulder pain  Skin: Negative for rash.  Neurological: Positive for weakness. Negative for facial asymmetry, speech difficulty and headaches.  Psychiatric/Behavioral: Negative for confusion.  All other systems reviewed and are negative.     Allergies  Penicillins; Verapamil; and Influenza vaccines  Home Medications   Prior to Admission medications   Medication Sig Start Date End Date Taking? Authorizing Provider  amLODipine (NORVASC) 5 MG tablet TAKE 1 TABLET BY MOUTH EVERY DAY 01/10/14  Yes [name], MD  atorvastatin (LIPITOR) 20  MG tablet TAKE 1 TABLET BY MOUTH DAILY 04/21/14  Yes [name], MD  Calcium 600 MG tablet Take 1 tablet (600 mg total) by mouth 2 (two) times daily. 01/30/11  Yes Burnice Logan, MD  cholecalciferol (VITAMIN D) 400 UNITS TABS Take 400 Units by mouth 2 (two) times daily.   Yes Historical Provider, MD  glipiZIDE (GLUCOTROL) 10 MG tablet TAKE 2 TABLETS BY MOUTH TWICE A DAY BEFORE MEALS 03/28/14  Yes [name], MD  Lancets [facility] ULTRASOFT) lancets Use as instructed to check  blood sugar twice a day as needed Dx Code 250.00 06/28/11  Yes Burnice Logan, MD  lisinopril (PRINIVIL,ZESTRIL) 5 MG tablet TAKE 1 TABLET BY MOUTH EVERY DAY 03/03/14  Yes [name], MD  metFORMIN (GLUCOPHAGE-XR) 500 MG 24 hr tablet Take 1 tablet (500 mg total) by mouth 2 (two) times daily. 12/10/13  Yes [name], MD  naproxen sodium (ANAPROX) 220 MG tablet Take 440 mg by mouth 2 (two) times daily as needed (pain).   Yes Historical Provider, MD  ONE TOUCH ULTRA TEST test strip Use as directed. Test blood sugar twice daily as needed. 01/14/14  Yes [name], MD  oxyCODONE (OXY IR/ROXICODONE) 5 MG immediate release tablet Take 1 tablet (5 mg total) by mouth every 4 (four) hours as needed for moderate pain. 05/21/14  Yes Reyne Dumas, MD  sitaGLIPtin (JANUVIA) 100 MG tablet 1/2 to 1 tab po daily Patient taking differently: Take 150 mg by mouth daily.  12/06/13  Yes [name], MD  terbinafine (LAMISIL AT) 1 % cream Apply 1 application topically 2 (two) times daily. Tinea pedis 03/14/14  Yes [name], MD  triamterene-hydrochlorothiazide (MAXZIDE) 75-50 MG per tablet TAKE 1 TABLET BY MOUTH DAILY. 03/02/14  Yes [name], MD   Triage Vitals: BP 115/49 mmHg  Pulse 57  Temp(Src) 98.8 F (37.1 C) (Oral)  Resp 13  Ht 6' (1.829 m)  Wt 225 lb (102.059 kg)  BMI 30.51 kg/m2  SpO2 92%   Physical Exam  Constitutional: He is oriented to person, place, and time. No distress.  Elderly  HENT:  Head: Normocephalic.  Contusion noted over the left cheek bone and orbit  Eyes: EOM are normal. Pupils are equal, round, and reactive to light.  Neck: Neck supple.  Cardiovascular: Normal rate, regular rhythm and normal heart sounds.   No murmur heard. Pulmonary/Chest: Effort normal and breath sounds normal. No respiratory distress. He has no wheezes.  Abdominal: Soft. Bowel sounds are normal. There is no tenderness. There is no rebound.  Musculoskeletal: He exhibits no edema.  1+  bilateral lower extremity edema, left slightly greater than right, tenderness palpation popliteal fossa Contusion right shoulder  Neurological: He is alert and oriented to person, place, and time. No cranial nerve deficit.  4+ out of 5 grip strength on the right, 4+ out of 5 biceps and triceps strength on the right, deltoid testing limited secondary to contusion over right shoulder, 5 out of 5 strength with dorsi and plantar flexion of bilateral lower extremities, equal reflexes bilaterally, gait deferred  Skin: Skin is warm and dry.  Psychiatric: He has a normal mood and affect.  Nursing note and vitals reviewed.   ED Course  Procedures (including critical care time)  DIAGNOSTIC STUDIES: Oxygen Saturation is 92% on RA, adequate by my interpretation.    COORDINATION OF CARE: 3:21 AM-Discussed treatment plan with pt at bedside and pt agreed to plan.  Labs Review Labs Reviewed  PROTIME-INR - Abnormal; Notable for the following:    Prothrombin Time 15.4 (*)    All other components within normal limits  APTT - Abnormal; Notable for the following:    aPTT 40 (*)    All other components within normal limits  CBC - Abnormal; Notable for the following:    Hemoglobin 12.5 (*)    HCT 37.8 (*)    All other components within normal limits  DIFFERENTIAL - Abnormal; Notable for the following:    Monocytes Relative 17 (*)    Monocytes Absolute 1.6 (*)    All other components within normal limits  COMPREHENSIVE METABOLIC PANEL  URINE RAPID DRUG SCREEN (HOSP PERFORMED)  URINALYSIS, ROUTINE W REFLEX MICROSCOPIC  I-STAT TROPOININ, ED    Imaging Review Ct Head Wo Contrast  05/23/2014   CLINICAL DATA:  Weakness in the right arm and leg since this morning. Grip is weaker than previously. Just released from hospital yesterday. Admitted Thursday after a fall. Small head bleed.  EXAM: CT HEAD WITHOUT CONTRAST  TECHNIQUE: Contiguous axial images were obtained from the base of the skull through the  vertex without intravenous contrast.  COMPARISON:  05/21/2014  FINDINGS: Small subdural hematoma is again demonstrated along the left side of the anterior falx. Maximal depth measures about 3 mm. No significant change since previous study. Diffuse atrophy and small vessel ischemic changes are present. Ventricular dilatation is consistent with central atrophy. No new acute hemorrhage is demonstrated. Gray-white matter junctions are distinct. Basal cisterns are not effaced. Calvarium appears intact. Mild mucosal thickening in the ethmoid air cells. Mastoid air cells are not opacified. Vascular calcifications.  IMPRESSION: Small subdural hematoma along the left anterior falx is unchanged. Chronic atrophy and small vessel ischemic changes. No new abnormalities since previous study.   Electronically Signed   By: Lucienne Capers M.D.   On: 05/23/2014 01:32   Ct Head Wo Contrast  05/21/2014   CLINICAL DATA:  Patient with subdural hematoma. , followup evaluation.  EXAM: CT HEAD WITHOUT CONTRAST  TECHNIQUE: Contiguous axial images were obtained from the base of the skull through the vertex without intravenous contrast.  COMPARISON:  05/19/2014  FINDINGS: No significant interval change in size of approximate 3 mm subdural hematoma along the left side of the anterior falx. Ventricles and sulci are prominent, compatible with atrophy. Periventricular and subcortical white matter hypodensity compatible with chronic small vessel ischemic change. No evidence for significant mass effect or acute cortically based infarct. 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 Re- demonstrated mildly comminuted fracture involving left aspect of the nasal bone. Additionally there are minimally displaced fractures to the inner and outer table of the left frontal sinus. Unchanged small amount of blood products within the left aspect of the frontal sinus.  IMPRESSION: 1. No significant interval change in acute subdural  hemorrhage along the left side of the anterior falx, measuring 3 mm in thickness. 2. Unchanged comminuted left side nasal bone fracture. 3. Re- demonstrated fractures to the inner and outer table of the left frontal sinus with blood products involving the left aspect of the frontal sinus.   Electronically Signed   By: Lovey Newcomer M.D.   On: 05/21/2014 07:34     EKG Interpretation None      MDM   Final diagnoses:  None    History presents with complaints of right arm weakness onset of 12 noon. Outside stroke window. Patient with 4+ out of 5 distal  arm weakness/give weakness. Contusion to the right shoulder as well as left side of the face consistent with prior fall. Concern would be worsening of known head bleed. Patient also reports pain of the left knee and leg. He has had difficulty ambulating. Stroke labs initiated. CT head shows stable bleed. Will obtain MRI brain and C-spine to evaluate for acute ischemic stroke and nerve impingement given recent fall. Patient will need an ultrasound of the left lower extremity to rule out DVT given swelling and pain.  MRI negative. At this time, unsure of the cause of the right upper extremity give weakness. May be related to pain in the right shoulder or impingement of a nerve in the right shoulder. Patient can have outpatient follow-up. Repeat exam, stable to slightly improved. Patient able to ambulate. Noted to have a limp and favoring the left leg. Plain films left knee negative. Given swelling will hold patient for DVT study. If positive, treatment may be complicated secondary to intracranial bleed.  Signed out to Dr. Sabra Heck.  I personally performed the services described in this documentation, which was scribed in my presence. The recorded information has been reviewed and is accurate.   Merryl Hacker, MD 05/23/14 [phone]

## 2014-05-23 NOTE — ED Notes (Signed)
The pt appears comfortable attempting to sleep  At intervals

## 2014-05-24 ENCOUNTER — Encounter: Payer: Self-pay | Admitting: Family Medicine

## 2014-05-24 ENCOUNTER — Ambulatory Visit (INDEPENDENT_AMBULATORY_CARE_PROVIDER_SITE_OTHER): Payer: Medicare Other | Admitting: Family Medicine

## 2014-05-24 VITALS — BP 132/70 | HR 70 | Temp 98.0°F | Ht 72.0 in | Wt 234.1 lb

## 2014-05-24 DIAGNOSIS — N289 Disorder of kidney and ureter, unspecified: Secondary | ICD-10-CM

## 2014-05-24 DIAGNOSIS — E1169 Type 2 diabetes mellitus with other specified complication: Secondary | ICD-10-CM

## 2014-05-24 DIAGNOSIS — I6200 Nontraumatic subdural hemorrhage, unspecified: Secondary | ICD-10-CM

## 2014-05-24 DIAGNOSIS — K219 Gastro-esophageal reflux disease without esophagitis: Secondary | ICD-10-CM

## 2014-05-24 DIAGNOSIS — I10 Essential (primary) hypertension: Secondary | ICD-10-CM

## 2014-05-24 DIAGNOSIS — E669 Obesity, unspecified: Secondary | ICD-10-CM

## 2014-05-24 DIAGNOSIS — E119 Type 2 diabetes mellitus without complications: Secondary | ICD-10-CM

## 2014-05-24 DIAGNOSIS — S065X9A Traumatic subdural hemorrhage with loss of consciousness of unspecified duration, initial encounter: Secondary | ICD-10-CM

## 2014-05-24 DIAGNOSIS — S065XAA Traumatic subdural hemorrhage with loss of consciousness status unknown, initial encounter: Secondary | ICD-10-CM

## 2014-05-24 NOTE — Progress Notes (Signed)
Pre visit review using our clinic review tool, if applicable. No additional management support is needed unless otherwise documented below in the visit note. 

## 2014-05-24 NOTE — Telephone Encounter (Signed)
Yes because he has a Subdural bleed it needs to be monitored so the hospitalist group is recommending repeat CT scan in 2 weeks

## 2014-05-24 NOTE — Telephone Encounter (Signed)
Patient had CT on 3/3, 3/5 and 3/7. Does he still need another one?

## 2014-05-24 NOTE — Patient Instructions (Addendum)
Tylenol ES 500 mg tabs 2 tabs by mouth twice daily and can take 2 extra tabs in 24 hours for severe pain (max of 6 tabs in 24 hours) No Aleve/Naproxen or Advil/Motrin/Ibuprofen they are all NSAIDs and can increase your risk of further bleeding or kidney trouble  Only use Oxycodone for very severe pain because it will increase your risk of falling   Citracal twice daily if palpitations or muscle cramps  Encouraged increased hydration and fiber in diet. Daily probiotics. If bowels not moving can use MOM 2 tbls po in 4 oz of warm prune juice by mouth every 2-3 days. If no results then repeat in 4 hours with  Dulcolax suppository pr, may repeat again in 4 more hours as needed. Seek care if symptoms worsen. Consider daily Miralax and/or Dulcolax if symptoms persist.     Subdural Hematoma A subdural hematoma is a collection of blood between the brain and its tough outermost membrane covering (the dura). Blood clots that form in this area push down on the brain and cause irritation. A subdural hematoma may cause parts of the brain to stop working and eventually cause death.  CAUSES A subdural hematoma is caused by bleeding from a ruptured blood vessel (hemorrhage). The bleeding results from trauma to the head, such as from a fall or motor vehicle accident. There are two types of subdural hemorrhages:  Acute. This type develops shortly after a serious blow to the head and causes blood to collect very quickly. If not diagnosed and treated promptly, severe brain injury or death can occur.  Chronic. This is when bleeding develops more slowly, over weeks or months. RISK FACTORS People at risk for subdural hematoma include older persons, infants, and alcoholics. SYMPTOMS An acute subdural hemorrhage develops over minutes to hours. Symptoms can include:  Temporary loss of consciousness.  Weakness of arms or legs on one side of the body.  Changes in vision or speech.  A severe  headache.  Seizures.  Nausea and vomiting.  Increased sleepiness. A chronic subdural hemorrhage develops over weeks to months. Symptoms may develop slowly and produce less noticeable problems or changes. Symptoms include:  A mild headache.  A change in personality.  Loss of balance or difficulty walking.  Weakness, numbness, or tingling in the arms or legs.  Nausea or vomiting.  Memory loss.  Double vision.  Increased sleepiness. DIAGNOSIS Your health care provider will perform a thorough physical and neurological exam. A CT scan or MRI may also be done. If there is blood on the scan, its color will help your health care provider determine how long the hemorrhage has been there. TREATMENT If the cause is an acute subdural hemorrhage, immediate treatment is needed. In many cases an emergency surgery is performed to drain accumulated blood or to remove the blood clot. Sometimes steroid or diuretic medicines or controlled breathing through a ventilator is needed to decrease pressure in the brain. This is especially true if there is any swelling of the brain. If the cause is a chronic subdural hemorrhage, treatment depends on a variety of factors. Sometimes no treatment is needed. If the subdural hematoma is small and causes minimal or no symptoms, you may be treated with bed rest, medicines, and observation. If the hemorrhage is large or if you have neurological symptoms, an emergency surgery is usually needed to remove the blood clot. People who develop a subdural hemorrhage are at risk of developing seizures, even after the subdural hematoma has been treated. You  may be prescribed an anti-seizure (anticonvulsant) medicine for a year or longer. HOME CARE INSTRUCTIONS  Only take medicines as directed by your health care provider.  Rest if directed by your health care provider.  Keep all follow-up appointments with your health care provider.  If you play a contact sport such as  football, hockey or soccer and you experienced a significant head injury, allow enough time for healing (up to 15 days) before you start playing again. A repeated injury that occurs during this fragile repair period is likely to result in hemorrhage. This is called the second impact syndrome. SEEK IMMEDIATE MEDICAL CARE IF:  You fall or experience minor trauma to your head and you are taking blood thinners. If you are on any blood thinners even a very small injury can cause a subdural hematoma. You should not hesitate to seek medical attention regardless of how minor you think your symptoms are.  You experience a head injury and have:  Drowsiness or a decrease in alertness.  Confusion or forgetfulness.  Slurred speech.  Irrational or aggressive behavior.  Numbness or paralysis in any part of the body.  A feeling of being sick to your stomach (nauseous) or you throw up (vomit).  Difficulty walking or poor coordination.  Double vision.  Seizures.  A bleeding disorder.  A history of heavy alcohol use.  Clear fluid draining from your nose or ears.  Personality changes.  Difficulty thinking.  Worsening symptoms. MAKE SURE YOU:  Understand these instructions.  Will watch your condition.  Will get help right away if you are not doing well or get worse. [employer] of Neurological Disorders and Stroke: MasterBoxes.it American Association of Neurological Surgeons: www.neurosurgerytoday.org American Academy of Neurology (AAN): http://keith.biz/ Brain Injury Association of America: www.biausa.org Document Released: 01/20/2004 Document Revised: 12/23/2012 Document Reviewed: 09/04/2012 [facility] Patient Information 2015 [place], [place]. This information is not intended to replace advice given to you by your health care provider. Make sure you discuss any questions you have with your health care provider.

## 2014-05-25 NOTE — Telephone Encounter (Signed)
Patient scheduled for 06/08/14, pt aware

## 2014-05-26 ENCOUNTER — Other Ambulatory Visit: Payer: Self-pay | Admitting: Family Medicine

## 2014-05-29 ENCOUNTER — Other Ambulatory Visit: Payer: Self-pay | Admitting: Family Medicine

## 2014-05-29 ENCOUNTER — Encounter: Payer: Self-pay | Admitting: Family Medicine

## 2014-05-29 NOTE — Assessment & Plan Note (Signed)
Well controlled, no changes to meds. Encouraged heart healthy diet such as the DASH diet and exercise as tolerated.  °

## 2014-05-29 NOTE — Assessment & Plan Note (Signed)
Avoid offending foods, start probiotics. Do not eat large meals in late evening and consider raising head of bed.  

## 2014-05-29 NOTE — Assessment & Plan Note (Signed)
hgba1c acceptable, minimize simple carbs. Increase exercise as tolerated. Continue current meds 

## 2014-05-29 NOTE — Progress Notes (Signed)
Vincent Allen  DH:[id number] [birth year] 05/29/2014      Progress Note-Follow Up  Subjective  Chief Complaint  Chief Complaint  Patient presents with  . Hospitalization Follow-up    HPI  Patient is a 81 y.o. male in today for routine medical care.  Patient is in today for hospital follow-up accompanied by his daughter. He fell and suffered a head injury resulting in a subdural hematoma. He was hospitalized and stabilized well. Upon being sent home developed some right-sided weakness so we presented to the hospital. Repeat imaging was completed which did not show any significant new bleeding. Follow-up imaging was recommended. The weakness on his right side he presented with has improved. He denies any headache or new complaints today. Denies CP/palp/SOB/HA/congestion/fevers/GI or GU c/o. Taking meds as prescribed Past Medical History  Diagnosis Date  . Cataract   . GERD (gastroesophageal reflux disease)   . Diabetes mellitus   . Hypertension   . Hyperlipidemia   . Colon polyps   . Chronic kidney disease stage III (GFR 30-59 ml/min) 05/03/2012  . Irregular heartbeat   . HTN (hypertension) 01/13/2013  . Preventative health care 01/17/2013  . Medicare annual wellness visit, subsequent 01/17/2013    Past Surgical History  Procedure Laterality Date  . Cataract extraction    . Rotator cuff repair    . Ankle arthroplasty    . Tonsillectomy    . Cholecystectomy    . Cholecystectomy N/A 05/03/2012    Procedure: LAPAROSCOPIC CHOLECYSTECTOMY WITH INTRAOPERATIVE CHOLANGIOGRAM;  Surgeon: [name], MD;  Location: [place];  Service: General;  Laterality: N/A;  . Cataract extraction  02/28/14    Family History  Problem Relation Age of Onset  . Cancer Mother     colon, breast  . Heart disease Father   . Stroke Father   . Cancer Paternal Grandmother     colon    History   Social History  . Marital Status: Divorced    Spouse Name: N/A  . Number of Children: N/A  . Years of  Education: N/A   Occupational History  . Not on file.   Social History Main Topics  . Smoking status: Never Smoker   . Smokeless tobacco: Never Used  . Alcohol Use: No  . Drug Use: No  . Sexual Activity: Not on file   Other Topics Concern  . Not on file   Social History Narrative    Current Outpatient Prescriptions on File Prior to Visit  Medication Sig Dispense Refill  . amLODipine (NORVASC) 5 MG tablet TAKE 1 TABLET BY MOUTH EVERY DAY 90 tablet 1  . atorvastatin (LIPITOR) 20 MG tablet TAKE 1 TABLET BY MOUTH DAILY 90 tablet 1  . Calcium 600 MG tablet Take 1 tablet (600 mg total) by mouth 2 (two) times daily. 60 tablet 0  . cholecalciferol (VITAMIN D) 400 UNITS TABS Take 400 Units by mouth 2 (two) times daily.    Marland Kitchen glipiZIDE (GLUCOTROL) 10 MG tablet TAKE 2 TABLETS BY MOUTH TWICE A DAY BEFORE MEALS 360 tablet 1  . Lancets (ONETOUCH ULTRASOFT) lancets Use as instructed to check blood sugar twice a day as needed Dx Code 250.00 100 each 3  . lisinopril (PRINIVIL,ZESTRIL) 5 MG tablet TAKE 1 TABLET BY MOUTH EVERY DAY 90 tablet 0  . metFORMIN (GLUCOPHAGE-XR) 500 MG 24 hr tablet Take 1 tablet (500 mg total) by mouth 2 (two) times daily. 180 tablet 1  . naproxen sodium (ANAPROX) 220 MG tablet Take 440  mg by mouth 2 (two) times daily as needed (pain).    . ONE TOUCH ULTRA TEST test strip Use as directed. Test blood sugar twice daily as needed. 100 each 1  . oxyCODONE (OXY IR/ROXICODONE) 5 MG immediate release tablet Take 1 tablet (5 mg total) by mouth every 4 (four) hours as needed for moderate pain. 30 tablet 0  . sitaGLIPtin (JANUVIA) 100 MG tablet 1/2 to 1 tab po daily (Patient taking differently: Take 150 mg by mouth daily. ) 30 tablet 5  . terbinafine (LAMISIL AT) 1 % cream Apply 1 application topically 2 (two) times daily. Tinea pedis 30 g 0   No current facility-administered medications on file prior to visit.    Allergies  Allergen Reactions  . Penicillins Hives and Itching  .  Verapamil     Junctional rhythm  . Influenza Vaccines Rash    Review of Systems  Review of Systems  Constitutional: Negative for fever and malaise/fatigue.  HENT: Negative for congestion.   Eyes: Negative for discharge.  Respiratory: Negative for shortness of breath.   Cardiovascular: Negative for chest pain, palpitations and leg swelling.  Gastrointestinal: Negative for nausea, abdominal pain and diarrhea.  Genitourinary: Negative for dysuria.  Musculoskeletal: Negative for falls.  Skin: Negative for rash.  Neurological: Positive for focal weakness. Negative for loss of consciousness and headaches.  Endo/Heme/Allergies: Negative for polydipsia.  Psychiatric/Behavioral: Negative for depression and suicidal ideas. The patient is not nervous/anxious and does not have insomnia.     Objective  BP 132/70 mmHg  Pulse 70  Temp(Src) 98 F (36.7 C) (Oral)  Ht 6' (1.829 m)  Wt 234 lb 2 oz (106.198 kg)  BMI 31.75 kg/m2  SpO2 94%  Physical Exam  Physical Exam  Constitutional: He is oriented to person, place, and time and well-developed, well-nourished, and in no distress. No distress.  HENT:  Head: Normocephalic and atraumatic.  Eyes: Conjunctivae are normal.  Neck: Neck supple. No thyromegaly present.  Cardiovascular: Normal rate, regular rhythm and normal heart sounds.   No murmur heard. Pulmonary/Chest: Effort normal and breath sounds normal. No respiratory distress.  Abdominal: He exhibits no distension and no mass. There is no tenderness.  Musculoskeletal: He exhibits no edema.  Neurological: He is alert and oriented to person, place, and time.  RUE 3/5 strength, RLE 4/5 strength left side 5/5 strength.   Skin: Skin is warm.  Psychiatric: Memory, affect and judgment normal.    Lab Results  Component Value Date   TSH 2.971 05/20/2014   Lab Results  Component Value Date   WBC 9.9 05/23/2014   HGB 12.5* 05/23/2014   HCT 37.8* 05/23/2014   MCV 83.6 05/23/2014   PLT  172 05/23/2014   Lab Results  Component Value Date   CREATININE 1.67* 05/23/2014   BUN 28* 05/23/2014   NA 132* 05/23/2014   K 4.5 05/23/2014   CL 105 05/23/2014   CO2 18* 05/23/2014   Lab Results  Component Value Date   ALT 17 05/23/2014   AST 16 05/23/2014   ALKPHOS 48 05/23/2014   BILITOT 1.2 05/23/2014   Lab Results  Component Value Date   CHOL 135 03/14/2014   Lab Results  Component Value Date   HDL 29.90* 03/14/2014   Lab Results  Component Value Date   LDLCALC 59 03/08/2014   Lab Results  Component Value Date   TRIG 232.0* 03/14/2014   Lab Results  Component Value Date   CHOLHDL 5 03/14/2014  Assessment & Plan  HTN (hypertension) Well controlled, no changes to meds. Encouraged heart healthy diet such as the DASH diet and exercise as tolerated.    GERD (gastroesophageal reflux disease) Avoid offending foods, start probiotics. Do not eat large meals in late evening and consider raising head of bed.    Diabetes mellitus type 2 in obese hgba1c acceptable, minimize simple carbs. Increase exercise as tolerated. Continue current meds   Subdural hematoma Recently hospitalized with hematoma and then had to return to ER with weakness on right side, symptoms are now improved. No acute complaints of today. New CT scan is ordered for 2 weeks to monitor. He will present for further care if symptoms worsen again

## 2014-05-29 NOTE — Assessment & Plan Note (Signed)
Recently hospitalized with hematoma and then had to return to ER with weakness on right side, symptoms are now improved. No acute complaints of today. New CT scan is ordered for 2 weeks to monitor. He will present for further care if symptoms worsen again

## 2014-06-06 ENCOUNTER — Other Ambulatory Visit (INDEPENDENT_AMBULATORY_CARE_PROVIDER_SITE_OTHER): Payer: Medicare Other

## 2014-06-06 DIAGNOSIS — N289 Disorder of kidney and ureter, unspecified: Secondary | ICD-10-CM

## 2014-06-06 LAB — COMPREHENSIVE METABOLIC PANEL
ALT: 25 U/L (ref 0–53)
AST: 16 U/L (ref 0–37)
Albumin: 3.7 g/dL (ref 3.5–5.2)
Alkaline Phosphatase: 53 U/L (ref 39–117)
BUN: 28 mg/dL — AB (ref 6–23)
CALCIUM: 9.2 mg/dL (ref 8.4–10.5)
CO2: 21 mEq/L (ref 19–32)
Chloride: 107 mEq/L (ref 96–112)
Creatinine, Ser: 1.33 mg/dL (ref 0.40–1.50)
GFR: 55.15 mL/min — AB (ref >60.00)
Glucose, Bld: 82 mg/dL (ref 70–99)
Potassium: 4.2 mEq/L (ref 3.5–5.1)
Sodium: 135 mEq/L (ref 135–145)
Total Bilirubin: 0.4 mg/dL (ref 0.2–1.2)
Total Protein: 7 g/dL (ref 6.0–8.3)

## 2014-06-08 ENCOUNTER — Ambulatory Visit (HOSPITAL_BASED_OUTPATIENT_CLINIC_OR_DEPARTMENT_OTHER)
Admission: RE | Admit: 2014-06-08 | Discharge: 2014-06-08 | Disposition: A | Discharge status: Home / Self Care | Payer: Medicare Other | Source: Ambulatory Visit | Attending: Family Medicine | Admitting: Family Medicine

## 2014-06-08 DIAGNOSIS — I6200 Nontraumatic subdural hemorrhage, unspecified: Secondary | ICD-10-CM | POA: Diagnosis not present

## 2014-06-08 DIAGNOSIS — S065X9A Traumatic subdural hemorrhage with loss of consciousness of unspecified duration, initial encounter: Secondary | ICD-10-CM

## 2014-06-08 DIAGNOSIS — S065XAA Traumatic subdural hemorrhage with loss of consciousness status unknown, initial encounter: Secondary | ICD-10-CM

## 2014-06-20 ENCOUNTER — Other Ambulatory Visit (INDEPENDENT_AMBULATORY_CARE_PROVIDER_SITE_OTHER): Payer: Medicare Other

## 2014-06-20 DIAGNOSIS — E785 Hyperlipidemia, unspecified: Secondary | ICD-10-CM

## 2014-06-20 DIAGNOSIS — N183 Chronic kidney disease, stage 3 unspecified: Secondary | ICD-10-CM

## 2014-06-20 DIAGNOSIS — I10 Essential (primary) hypertension: Secondary | ICD-10-CM

## 2014-06-20 DIAGNOSIS — K219 Gastro-esophageal reflux disease without esophagitis: Secondary | ICD-10-CM | POA: Diagnosis not present

## 2014-06-20 LAB — LIPID PANEL
CHOL/HDL RATIO: 3
CHOLESTEROL: 118 mg/dL (ref 0–200)
HDL: 34 mg/dL — AB (ref >39.00)
LDL Cholesterol: 52 mg/dL (ref 0–99)
NonHDL: 84
Triglycerides: 161 mg/dL — ABNORMAL HIGH (ref 0.0–149.0)
VLDL: 32.2 mg/dL (ref 0.0–40.0)

## 2014-06-20 LAB — CBC WITH DIFFERENTIAL/PLATELET
BASOS ABS: 0 10*3/uL (ref 0.0–0.1)
Basophils Relative: 0.5 % (ref 0.0–3.0)
EOS ABS: 0.2 10*3/uL (ref 0.0–0.7)
Eosinophils Relative: 3.2 % (ref 0.0–5.0)
HCT: 42.5 % (ref 39.0–52.0)
Hemoglobin: 13.9 g/dL (ref 13.0–17.0)
LYMPHS ABS: 1.8 10*3/uL (ref 0.7–4.0)
Lymphocytes Relative: 28.1 % (ref 12.0–46.0)
MCHC: 32.7 g/dL (ref 30.0–36.0)
MCV: 83 fl (ref 78.0–100.0)
MONO ABS: 0.5 10*3/uL (ref 0.1–1.0)
Monocytes Relative: 7.5 % (ref 3.0–12.0)
Neutro Abs: 3.9 10*3/uL (ref 1.4–7.7)
Neutrophils Relative %: 60.7 % (ref 43.0–77.0)
PLATELETS: 195 10*3/uL (ref 150.0–400.0)
RBC: 5.13 Mil/uL (ref 4.22–5.81)
RDW: 13.6 % (ref 11.5–15.5)
WBC: 6.5 10*3/uL (ref 4.0–10.5)

## 2014-06-20 LAB — COMPLETE METABOLIC PANEL WITH GFR
ALK PHOS: 50 U/L (ref 39–117)
ALT: 28 U/L (ref 0–53)
AST: 17 U/L (ref 0–37)
Albumin: 3.6 g/dL (ref 3.5–5.2)
BUN: 23 mg/dL (ref 6–23)
CO2: 22 mEq/L (ref 19–32)
CREATININE: 1.16 mg/dL (ref 0.50–1.35)
Calcium: 8.7 mg/dL (ref 8.4–10.5)
Chloride: 107 mEq/L (ref 96–112)
GFR, Est African American: 69 mL/min
GFR, Est Non African American: 60 mL/min
Glucose, Bld: 106 mg/dL — ABNORMAL HIGH (ref 70–99)
POTASSIUM: 4.6 meq/L (ref 3.5–5.3)
Sodium: 140 mEq/L (ref 135–145)
Total Bilirubin: 0.6 mg/dL (ref 0.2–1.2)
Total Protein: 6.3 g/dL (ref 6.0–8.3)

## 2014-06-20 LAB — HEMOGLOBIN A1C: Hgb A1c MFr Bld: 7.2 % — ABNORMAL HIGH (ref 4.6–6.5)

## 2014-06-20 LAB — TSH: TSH: 2.72 u[IU]/mL (ref 0.35–4.50)

## 2014-06-23 ENCOUNTER — Ambulatory Visit (INDEPENDENT_AMBULATORY_CARE_PROVIDER_SITE_OTHER): Payer: Medicare Other | Admitting: Family Medicine

## 2014-06-23 ENCOUNTER — Encounter: Payer: Self-pay | Admitting: Family Medicine

## 2014-06-23 VITALS — BP 102/74 | HR 63 | Temp 97.9°F | Resp 16 | Ht 72.0 in | Wt 226.8 lb

## 2014-06-23 DIAGNOSIS — I10 Essential (primary) hypertension: Secondary | ICD-10-CM

## 2014-06-23 DIAGNOSIS — E1169 Type 2 diabetes mellitus with other specified complication: Secondary | ICD-10-CM

## 2014-06-23 DIAGNOSIS — E119 Type 2 diabetes mellitus without complications: Secondary | ICD-10-CM

## 2014-06-23 DIAGNOSIS — K219 Gastro-esophageal reflux disease without esophagitis: Secondary | ICD-10-CM

## 2014-06-23 DIAGNOSIS — E782 Mixed hyperlipidemia: Secondary | ICD-10-CM | POA: Diagnosis not present

## 2014-06-23 DIAGNOSIS — E669 Obesity, unspecified: Secondary | ICD-10-CM

## 2014-06-23 DIAGNOSIS — E785 Hyperlipidemia, unspecified: Secondary | ICD-10-CM

## 2014-06-23 NOTE — Patient Instructions (Addendum)
Wait 2-3 more weeks and then restart aspirin 81 mg daily  Basic Carbohydrate Counting for Diabetes Mellitus Carbohydrate counting is a method for keeping track of the amount of carbohydrates you eat. Eating carbohydrates naturally increases the level of sugar (glucose) in your blood, so it is important for you to know the amount that is okay for you to have in every meal. Carbohydrate counting helps keep the level of glucose in your blood within normal limits. The amount of carbohydrates allowed is different for every person. A dietitian can help you calculate the amount that is right for you. Once you know the amount of carbohydrates you can have, you can count the carbohydrates in the foods you want to eat. Carbohydrates are found in the following foods:  Grains, such as breads and cereals.  Dried beans and soy products.  Starchy vegetables, such as potatoes, peas, and corn.  Fruit and fruit juices.  Milk and yogurt.  Sweets and snack foods, such as cake, cookies, candy, chips, soft drinks, and fruit drinks. CARBOHYDRATE COUNTING There are two ways to count the carbohydrates in your food. You can use either of the methods or a combination of both. Reading the "Nutrition Facts" on [place] The "Nutrition Facts" is an area that is included on the labels of almost all packaged food and beverages in the [place]. It includes the serving size of that food or beverage and information about the nutrients in each serving of the food, including the grams (g) of carbohydrate per serving.  Decide the number of servings of this food or beverage that you will be able to eat or drink. Multiply that number of servings by the number of grams of carbohydrate that is listed on the label for that serving. The total will be the amount of carbohydrates you will be having when you eat or drink this food or beverage. Learning Standard Serving Sizes of Food When you eat food that is not packaged or does  not include "Nutrition Facts" on the label, you need to measure the servings in order to count the amount of carbohydrates.A serving of most carbohydrate-rich foods contains about 15 g of carbohydrates. The following list includes serving sizes of carbohydrate-rich foods that provide 15 g ofcarbohydrate per serving:   1 slice of bread (1 oz) or 1 six-inch tortilla.    of a hamburger bun or English muffin.  4-6 crackers.   cup unsweetened dry cereal.    cup hot cereal.   cup rice or pasta.    cup mashed potatoes or  of a large baked potato.  1 cup fresh fruit or one small piece of fruit.    cup canned or frozen fruit or fruit juice.  1 cup milk.   cup plain fat-free yogurt or yogurt sweetened with artificial sweeteners.   cup cooked dried beans or starchy vegetable, such as peas, corn, or potatoes.  Decide the number of standard-size servings that you will eat. Multiply that number of servings by 15 (the grams of carbohydrates in that serving). For example, if you eat 2 cups of strawberries, you will have eaten 2 servings and 30 g of carbohydrates (2 servings x 15 g = 30 g). For foods such as soups and casseroles, in which more than one food is mixed in, you will need to count the carbohydrates in each food that is included. EXAMPLE OF CARBOHYDRATE COUNTING Sample Dinner  3 oz chicken breast.   cup of brown rice.   cup  of corn.  1 cup milk.   1 cup strawberries with sugar-free whipped topping.  Carbohydrate Calculation Step 1: Identify the foods that contain carbohydrates:   Rice.   Corn.   Milk.   Strawberries. Step 2:Calculate the number of servings eaten of each:   2 servings of rice.   1 serving of corn.   1 serving of milk.   1 serving of strawberries. Step 3: Multiply each of those number of servings by 15 g:   2 servings of rice x 15 g = 30 g.   1 serving of corn x 15 g = 15 g.   1 serving of milk x 15 g = 15 g.   1  serving of strawberries x 15 g = 15 g. Step 4: Add together all of the amounts to find the total grams of carbohydrates eaten: 30 g + 15 g + 15 g + 15 g = 75 g. Document Released: 03/04/2005 Document Revised: 07/19/2013 Document Reviewed: 01/29/2013 [facility] Patient Information 2015 [place], [place]. This information is not intended to replace advice given to you by your health care provider. Make sure you discuss any questions you have with your health care provider.

## 2014-06-23 NOTE — Progress Notes (Signed)
Vincent Allen  DH:[id number] 10-11-1933 06/23/2014      Progress Note-Follow Up  Subjective  Chief Complaint  Chief Complaint  Patient presents with  . Follow up    CT head 3/23     HPI  Patient is a 81 y.o. male in today for routine medical care. Patient is feeling well today. Reports blood sugars and ranging from 80-120. Denies polyuria or polydipsia. Has been trying to minimize carbohydrates and stay active. No recent illness. Denies CP/palp/SOB/HA/congestion/fevers/GI or GU c/o. Taking meds as prescribed  Past Medical History  Diagnosis Date  . Cataract   . GERD (gastroesophageal reflux disease)   . Diabetes mellitus   . Hypertension   . Hyperlipidemia   . Colon polyps   . Chronic kidney disease stage III (GFR 30-59 ml/min) 05/03/2012  . Irregular heartbeat   . HTN (hypertension) 01/13/2013  . Preventative health care 01/17/2013  . Medicare annual wellness visit, subsequent 01/17/2013    Past Surgical History  Procedure Laterality Date  . Cataract extraction    . Rotator cuff repair    . Ankle arthroplasty    . Tonsillectomy    . Cholecystectomy    . Cholecystectomy N/A 05/03/2012    Procedure: LAPAROSCOPIC CHOLECYSTECTOMY WITH INTRAOPERATIVE CHOLANGIOGRAM;  Surgeon: [name], MD;  Location: [place];  Service: General;  Laterality: N/A;  . Cataract extraction  02/28/14    Family History  Problem Relation Age of Onset  . Cancer Mother     colon, breast  . Heart disease Father   . Stroke Father   . Cancer Paternal Grandmother     colon    History   Social History  . Marital Status: Divorced    Spouse Name: N/A  . Number of Children: N/A  . Years of Education: N/A   Occupational History  . Not on file.   Social History Main Topics  . Smoking status: Never Smoker   . Smokeless tobacco: Never Used  . Alcohol Use: No  . Drug Use: No  . Sexual Activity: Not on file   Other Topics Concern  . Not on file   Social History Narrative    Current  Outpatient Prescriptions on File Prior to Visit  Medication Sig Dispense Refill  . amLODipine (NORVASC) 5 MG tablet TAKE 1 TABLET BY MOUTH EVERY DAY 90 tablet 1  . atorvastatin (LIPITOR) 20 MG tablet TAKE 1 TABLET BY MOUTH DAILY 90 tablet 1  . Calcium 600 MG tablet Take 1 tablet (600 mg total) by mouth 2 (two) times daily. 60 tablet 0  . cholecalciferol (VITAMIN D) 400 UNITS TABS Take 400 Units by mouth 2 (two) times daily.    Marland Kitchen glipiZIDE (GLUCOTROL) 10 MG tablet TAKE 2 TABLETS BY MOUTH TWICE A DAY BEFORE MEALS 360 tablet 1  . Lancets (ONETOUCH ULTRASOFT) lancets Use as instructed to check blood sugar twice a day as needed Dx Code 250.00 100 each 3  . lisinopril (PRINIVIL,ZESTRIL) 5 MG tablet TAKE 1 TABLET BY MOUTH EVERY DAY 90 tablet 2  . metFORMIN (GLUCOPHAGE-XR) 500 MG 24 hr tablet Take 1 tablet (500 mg total) by mouth 2 (two) times daily. 180 tablet 1  . ONE TOUCH ULTRA TEST test strip Use as directed. Test blood sugar twice daily as needed. 100 each 1  . sitaGLIPtin (JANUVIA) 100 MG tablet 1/2 to 1 tab po daily (Patient taking differently: Take 150 mg by mouth daily. ) 30 tablet 5  . terbinafine (LAMISIL AT) 1 %  cream Apply 1 application topically 2 (two) times daily. Tinea pedis 30 g 0  . triamterene-hydrochlorothiazide (MAXZIDE) 75-50 MG per tablet TAKE 1 TABLET BY MOUTH DAILY. 90 tablet 3  . naproxen sodium (ANAPROX) 220 MG tablet Take 440 mg by mouth 2 (two) times daily as needed (pain).    Marland Kitchen oxyCODONE (OXY IR/ROXICODONE) 5 MG immediate release tablet Take 1 tablet (5 mg total) by mouth every 4 (four) hours as needed for moderate pain. (Patient not taking: Reported on 06/23/2014) 30 tablet 0   No current facility-administered medications on file prior to visit.    Allergies  Allergen Reactions  . Penicillins Hives and Itching  . Verapamil     Junctional rhythm  . Influenza Vaccines Rash    Review of Systems  Review of Systems  Constitutional: Negative for fever and  malaise/fatigue.  HENT: Negative for congestion.   Eyes: Negative for discharge.  Respiratory: Negative for shortness of breath.   Cardiovascular: Negative for chest pain, palpitations and leg swelling.  Gastrointestinal: Negative for nausea, abdominal pain and diarrhea.  Genitourinary: Negative for dysuria.  Musculoskeletal: Negative for falls.  Skin: Negative for rash.  Neurological: Negative for loss of consciousness and headaches.  Endo/Heme/Allergies: Negative for polydipsia.  Psychiatric/Behavioral: Negative for depression and suicidal ideas. The patient is not nervous/anxious and does not have insomnia.     Objective  BP 102/74 mmHg  Pulse 63  Temp(Src) 97.9 F (36.6 C) (Oral)  Resp 16  Ht 6' (1.829 m)  Wt 226 lb 12.8 oz (102.876 kg)  BMI 30.75 kg/m2  SpO2 97%  Physical Exam  Physical Exam  Constitutional: He is oriented to person, place, and time and well-developed, well-nourished, and in no distress. No distress.  HENT:  Head: Normocephalic and atraumatic.  Eyes: Conjunctivae are normal.  Neck: Neck supple. No thyromegaly present.  Cardiovascular: Normal rate, regular rhythm and normal heart sounds.   Pulmonary/Chest: Effort normal and breath sounds normal. No respiratory distress.  Abdominal: He exhibits no distension and no mass. There is no tenderness.  Musculoskeletal: He exhibits no edema.  Neurological: He is alert and oriented to person, place, and time.  Skin: Skin is warm.  Psychiatric: Memory, affect and judgment normal.    Lab Results  Component Value Date   TSH 2.72 06/20/2014   Lab Results  Component Value Date   WBC 6.5 06/20/2014   HGB 13.9 06/20/2014   HCT 42.5 06/20/2014   MCV 83.0 06/20/2014   PLT 195.0 06/20/2014   Lab Results  Component Value Date   CREATININE 1.16 06/20/2014   BUN 23 06/20/2014   NA 140 06/20/2014   K 4.6 06/20/2014   CL 107 06/20/2014   CO2 22 06/20/2014   Lab Results  Component Value Date   ALT 28  06/20/2014   AST 17 06/20/2014   ALKPHOS 50 06/20/2014   BILITOT 0.6 06/20/2014   Lab Results  Component Value Date   CHOL 118 06/20/2014   Lab Results  Component Value Date   HDL 34.00* 06/20/2014   Lab Results  Component Value Date   LDLCALC 52 06/20/2014   Lab Results  Component Value Date   TRIG 161.0* 06/20/2014   Lab Results  Component Value Date   CHOLHDL 3 06/20/2014     Assessment & Plan  HTN (hypertension) Well controlled, no changes to meds. Encouraged heart healthy diet such as the DASH diet and exercise as tolerated.    GERD (gastroesophageal reflux disease) Avoid offending foods, start  probiotics. Do not eat large meals in late evening and consider raising head of bed.    Diabetes mellitus type 2 in obese hgba1c acceptable, minimize simple carbs. Increase exercise as tolerated. Continue current meds   Hyperlipidemia Tolerating statin, encouraged heart healthy diet, avoid trans fats, minimize simple carbs and saturated fats. Increase exercise as tolerated

## 2014-06-23 NOTE — Progress Notes (Signed)
Pre visit review using our clinic review tool, if applicable. No additional management support is needed unless otherwise documented below in the visit note. 

## 2014-06-26 NOTE — Assessment & Plan Note (Signed)
Tolerating statin, encouraged heart healthy diet, avoid trans fats, minimize simple carbs and saturated fats. Increase exercise as tolerated 

## 2014-06-26 NOTE — Assessment & Plan Note (Signed)
Avoid offending foods, start probiotics. Do not eat large meals in late evening and consider raising head of bed.  

## 2014-06-26 NOTE — Assessment & Plan Note (Signed)
Well controlled, no changes to meds. Encouraged heart healthy diet such as the DASH diet and exercise as tolerated.  °

## 2014-06-26 NOTE — Assessment & Plan Note (Signed)
hgba1c acceptable, minimize simple carbs. Increase exercise as tolerated. Continue current meds 

## 2014-06-30 ENCOUNTER — Other Ambulatory Visit: Payer: Self-pay | Admitting: Family Medicine

## 2014-07-31 ENCOUNTER — Encounter: Payer: Self-pay | Admitting: Family Medicine

## 2014-08-01 ENCOUNTER — Other Ambulatory Visit: Payer: Self-pay | Admitting: Family Medicine

## 2014-08-01 MED ORDER — GLUCOSE BLOOD VI STRP: ORAL_STRIP | Status: DC

## 2014-08-02 ENCOUNTER — Encounter: Payer: Self-pay | Admitting: Family Medicine

## 2014-08-02 ENCOUNTER — Other Ambulatory Visit: Payer: Self-pay | Admitting: Family Medicine

## 2014-08-02 MED ORDER — GLUCOSE BLOOD VI STRP: ORAL_STRIP | Status: DC

## 2014-08-02 NOTE — Telephone Encounter (Signed)
Strips sent to [facility] per [employer].

## 2014-08-02 NOTE — Addendum Note (Signed)
Addended by: Sharon Seller B on: 08/02/2014 09:03 AM   Modules accepted: Orders

## 2014-09-12 ENCOUNTER — Other Ambulatory Visit: Payer: Self-pay

## 2014-11-04 ENCOUNTER — Other Ambulatory Visit (INDEPENDENT_AMBULATORY_CARE_PROVIDER_SITE_OTHER): Payer: Medicare Other

## 2014-11-04 DIAGNOSIS — I10 Essential (primary) hypertension: Secondary | ICD-10-CM | POA: Diagnosis not present

## 2014-11-04 DIAGNOSIS — E119 Type 2 diabetes mellitus without complications: Secondary | ICD-10-CM

## 2014-11-04 DIAGNOSIS — E669 Obesity, unspecified: Secondary | ICD-10-CM | POA: Diagnosis not present

## 2014-11-04 DIAGNOSIS — E782 Mixed hyperlipidemia: Secondary | ICD-10-CM

## 2014-11-04 DIAGNOSIS — E1169 Type 2 diabetes mellitus with other specified complication: Secondary | ICD-10-CM

## 2014-11-04 LAB — COMPREHENSIVE METABOLIC PANEL
ALBUMIN: 3.9 g/dL (ref 3.5–5.2)
ALK PHOS: 57 U/L (ref 39–117)
ALT: 19 U/L (ref 0–53)
AST: 14 U/L (ref 0–37)
BUN: 31 mg/dL — AB (ref 6–23)
CHLORIDE: 109 meq/L (ref 96–112)
CO2: 23 mEq/L (ref 19–32)
Calcium: 9.2 mg/dL (ref 8.4–10.5)
Creatinine, Ser: 1.34 mg/dL (ref 0.40–1.50)
GFR: 54.61 mL/min — ABNORMAL LOW (ref >60.00)
GLUCOSE: 124 mg/dL — AB (ref 70–99)
Potassium: 4.2 mEq/L (ref 3.5–5.1)
SODIUM: 141 meq/L (ref 135–145)
TOTAL PROTEIN: 7 g/dL (ref 6.0–8.3)
Total Bilirubin: 0.4 mg/dL (ref 0.2–1.2)

## 2014-11-04 LAB — HEMOGLOBIN A1C: Hgb A1c MFr Bld: 6.6 % — ABNORMAL HIGH (ref 4.6–6.5)

## 2014-11-04 LAB — LIPID PANEL
CHOLESTEROL: 123 mg/dL (ref 0–200)
HDL: 31.6 mg/dL — ABNORMAL LOW (ref >39.00)
LDL Cholesterol: 70 mg/dL (ref 0–99)
NonHDL: 91.48
Total CHOL/HDL Ratio: 4
Triglycerides: 109 mg/dL (ref 0.0–149.0)
VLDL: 21.8 mg/dL (ref 0.0–40.0)

## 2014-11-04 LAB — TSH: TSH: 2.79 u[IU]/mL (ref 0.35–4.50)

## 2014-11-04 LAB — CBC
HEMATOCRIT: 44.5 % (ref 39.0–52.0)
HEMOGLOBIN: 14.4 g/dL (ref 13.0–17.0)
MCHC: 32.2 g/dL (ref 30.0–36.0)
MCV: 85.4 fl (ref 78.0–100.0)
Platelets: 186 10*3/uL (ref 150.0–400.0)
RBC: 5.21 Mil/uL (ref 4.22–5.81)
RDW: 13.6 % (ref 11.5–15.5)
WBC: 7.2 10*3/uL (ref 4.0–10.5)

## 2014-11-07 ENCOUNTER — Other Ambulatory Visit: Payer: Self-pay | Admitting: Family Medicine

## 2014-11-10 ENCOUNTER — Encounter: Payer: Self-pay | Admitting: Behavioral Health

## 2014-11-10 ENCOUNTER — Telehealth: Payer: Self-pay | Admitting: Behavioral Health

## 2014-11-10 NOTE — Addendum Note (Signed)
Addended by: Kathlen Brunswick on: 11/10/2014 04:36 PM   Modules accepted: Medications

## 2014-11-10 NOTE — Telephone Encounter (Signed)
Pre-Visit Call completed with patient and chart updated.   Pre-Visit Info documented in Specialty Comments under [employer].    

## 2014-11-10 NOTE — Telephone Encounter (Signed)
Unable to reach patient at time of Pre-Visit Call.  Left message for patient to return call when available.    

## 2014-11-11 ENCOUNTER — Encounter: Payer: Self-pay | Admitting: Family Medicine

## 2014-11-11 ENCOUNTER — Ambulatory Visit (INDEPENDENT_AMBULATORY_CARE_PROVIDER_SITE_OTHER): Payer: Medicare Other | Admitting: Family Medicine

## 2014-11-11 VITALS — BP 140/70 | HR 73 | Temp 98.1°F | Wt 230.0 lb

## 2014-11-11 DIAGNOSIS — E119 Type 2 diabetes mellitus without complications: Secondary | ICD-10-CM | POA: Diagnosis not present

## 2014-11-11 DIAGNOSIS — E1169 Type 2 diabetes mellitus with other specified complication: Secondary | ICD-10-CM

## 2014-11-11 DIAGNOSIS — Z0000 Encounter for general adult medical examination without abnormal findings: Secondary | ICD-10-CM | POA: Diagnosis not present

## 2014-11-11 DIAGNOSIS — E785 Hyperlipidemia, unspecified: Secondary | ICD-10-CM

## 2014-11-11 DIAGNOSIS — M8580 Other specified disorders of bone density and structure, unspecified site: Secondary | ICD-10-CM

## 2014-11-11 DIAGNOSIS — K219 Gastro-esophageal reflux disease without esophagitis: Secondary | ICD-10-CM | POA: Diagnosis not present

## 2014-11-11 DIAGNOSIS — E669 Obesity, unspecified: Secondary | ICD-10-CM

## 2014-11-11 DIAGNOSIS — I10 Essential (primary) hypertension: Secondary | ICD-10-CM | POA: Diagnosis not present

## 2014-11-11 MED ORDER — GLIPIZIDE 10 MG PO TABS: ORAL_TABLET | ORAL | Status: DC

## 2014-11-11 NOTE — Assessment & Plan Note (Signed)
Avoid offending foods, start probiotics. Do not eat large meals in late evening and consider raising head of bed.  

## 2014-11-11 NOTE — Assessment & Plan Note (Signed)
Patient denies any difficulties at home. No trouble with ADLs, depression or falls. No recent changes to vision or hearing. Is UTD with immunizations. Is UTD with screening. Discussed Advanced Directives, patient agrees to bring [place] copies of documents if can. Encouraged heart healthy diet, exercise as tolerated and adequate sleep  Preferred Pharmacy and which med where: [id number] - [place], [place] - [address] [place] [address] [place] [address]  Allergies verified: UTD  Immunization Status: Flu vaccine-- Declines Tdap-- 05/19/14 PNA-- 06/27/11 Shingles-- patient has not had this vaccine.  A/P:  Changes to [place], [place] or Personal Hx: UTD CCS: 06/02/02 at [facility]; polyp found in cecum; no follow-up recommendations listed. PSA: 10/07/12 w/ Dr. [name]; 1.18; normal Bone Density: NA  Care Teams Updated: Dr. Clent Jacks - Opthalmology  Dr. Danella Sensing - Dermatologist  ED/Hospital/Urgent Care Visits: No recent visits to the ED/Hospital or Urgent Care.

## 2014-11-11 NOTE — Progress Notes (Signed)
Pre visit review using our clinic review tool, if applicable. No additional management support is needed unless otherwise documented below in the visit note. 

## 2014-11-11 NOTE — Assessment & Plan Note (Signed)
Well controlled, no changes to meds. Encouraged heart healthy diet such as the DASH diet and exercise as tolerated.  °

## 2014-11-11 NOTE — Patient Instructions (Addendum)
Call insurance and confirm where they recommend you get your hearing checked  Preventive Care for Adults A healthy lifestyle and preventive care can promote health and wellness. Preventive health guidelines for men include the following key practices:  A routine yearly physical is a good way to check with your health care provider about your health and preventative screening. It is a chance to share any concerns and updates on your health and to receive a thorough exam.  Visit your dentist for a routine exam and preventative care every 6 months. Brush your teeth twice a day and floss once a day. Good oral hygiene prevents tooth decay and gum disease.  The frequency of eye exams is based on your age, health, family medical history, use of contact lenses, and other factors. Follow your health care provider's recommendations for frequency of eye exams.  Eat a healthy diet. Foods such as vegetables, fruits, whole grains, low-fat dairy products, and lean protein foods contain the nutrients you need without too many calories. Decrease your intake of foods high in solid fats, added sugars, and salt. Eat the right amount of calories for you.Get information about a proper diet from your health care provider, if necessary.  Regular physical exercise is one of the most important things you can do for your health. Most adults should get at least 150 minutes of moderate-intensity exercise (any activity that increases your heart rate and causes you to sweat) each week. In addition, most adults need muscle-strengthening exercises on 2 or more days a week.  Maintain a healthy weight. The body mass index (BMI) is a screening tool to identify possible weight problems. It provides an estimate of body fat based on height and weight. Your health care provider can find your BMI and can help you achieve or maintain a healthy weight.For adults 20 years and older:  A BMI below 18.5 is considered underweight.  A BMI of  18.5 to 24.9 is normal.  A BMI of 25 to 29.9 is considered overweight.  A BMI of 30 and above is considered obese.  Maintain normal blood lipids and cholesterol levels by exercising and minimizing your intake of saturated fat. Eat a balanced diet with plenty of fruit and vegetables. Blood tests for lipids and cholesterol should begin at age 52 and be repeated every 5 years. If your lipid or cholesterol levels are high, you are over 50, or you are at high risk for heart disease, you may need your cholesterol levels checked more frequently.Ongoing high lipid and cholesterol levels should be treated with medicines if diet and exercise are not working.  If you smoke, find out from your health care provider how to quit. If you do not use tobacco, do not start.  Lung cancer screening is recommended for adults aged 77-80 years who are at high risk for developing lung cancer because of a history of smoking. A yearly low-dose CT scan of the lungs is recommended for people who have at least a 30-pack-year history of smoking and are a current smoker or have quit within the past 15 years. A pack year of smoking is smoking an average of 1 pack of cigarettes a day for 1 year (for example: 1 pack a day for 30 years or 2 packs a day for 15 years). Yearly screening should continue until the smoker has stopped smoking for at least 15 years. Yearly screening should be stopped for people who develop a health problem that would prevent them from having lung cancer  treatment.  If you choose to drink alcohol, do not have more than 2 drinks per day. One drink is considered to be 12 ounces (355 mL) of beer, 5 ounces (148 mL) of wine, or 1.5 ounces (44 mL) of liquor.  Avoid use of street drugs. Do not share needles with anyone. Ask for help if you need support or instructions about stopping the use of drugs.  High blood pressure causes heart disease and increases the risk of stroke. Your blood pressure should be checked at  least every 1-2 years. Ongoing high blood pressure should be treated with medicines, if weight loss and exercise are not effective.  If you are 73-21 years old, ask your health care provider if you should take aspirin to prevent heart disease.  Diabetes screening involves taking a blood sample to check your fasting blood sugar level. This should be done once every 3 years, after age 25, if you are within normal weight and without risk factors for diabetes. Testing should be considered at a younger age or be carried out more frequently if you are overweight and have at least 1 risk factor for diabetes.  Colorectal cancer can be detected and often prevented. Most routine colorectal cancer screening begins at the age of 76 and continues through age 63. However, your health care provider may recommend screening at an earlier age if you have risk factors for colon cancer. On a yearly basis, your health care provider may provide home test kits to check for hidden blood in the stool. Use of a small camera at the end of a tube to directly examine the colon (sigmoidoscopy or colonoscopy) can detect the earliest forms of colorectal cancer. Talk to your health care provider about this at age 65, when routine screening begins. Direct exam of the colon should be repeated every 5-10 years through age 109, unless early forms of precancerous polyps or small growths are found.  People who are at an increased risk for hepatitis B should be screened for this virus. You are considered at high risk for hepatitis B if:  You were born in a country where hepatitis B occurs often. Talk with your health care provider about which countries are considered high risk.  Your parents were born in a high-risk country and you have not received a shot to protect against hepatitis B (hepatitis B vaccine).  You have HIV or AIDS.  You use needles to inject street drugs.  You live with, or have sex with, someone who has hepatitis  B.  You are a man who has sex with other men (MSM).  You get hemodialysis treatment.  You take certain medicines for conditions such as cancer, organ transplantation, and autoimmune conditions.  Hepatitis C blood testing is recommended for all people born from [birth year] through 1965 and any individual with known risks for hepatitis C.  Practice safe sex. Use condoms and avoid high-risk sexual practices to reduce the spread of sexually transmitted infections (STIs). STIs include gonorrhea, chlamydia, syphilis, trichomonas, herpes, HPV, and human immunodeficiency virus (HIV). Herpes, HIV, and HPV are viral illnesses that have no cure. They can result in disability, cancer, and death.  If you are at risk of being infected with HIV, it is recommended that you take a prescription medicine daily to prevent HIV infection. This is called preexposure prophylaxis (PrEP). You are considered at risk if:  You are a man who has sex with other men (MSM) and have other risk factors.  You are a  heterosexual man, are sexually active, and are at increased risk for HIV infection.  You take drugs by injection.  You are sexually active with a partner who has HIV.  Talk with your health care provider about whether you are at high risk of being infected with HIV. If you choose to begin PrEP, you should first be tested for HIV. You should then be tested every 3 months for as long as you are taking PrEP.  A one-time screening for abdominal aortic aneurysm (AAA) and surgical repair of large AAAs by ultrasound are recommended for men ages 36 to 73 years who are current or former smokers.  Healthy men should no longer receive prostate-specific antigen (PSA) blood tests as part of routine cancer screening. Talk with your health care provider about prostate cancer screening.  Testicular cancer screening is not recommended for adult males who have no symptoms. Screening includes self-exam, a health care provider exam, and  other screening tests. Consult with your health care provider about any symptoms you have or any concerns you have about testicular cancer.  Use sunscreen. Apply sunscreen liberally and repeatedly throughout the day. You should seek shade when your shadow is shorter than you. Protect yourself by wearing long sleeves, pants, a wide-brimmed hat, and sunglasses year round, whenever you are outdoors.  Once a month, do a whole-body skin exam, using a mirror to look at the skin on your back. Tell your health care provider about new moles, moles that have irregular borders, moles that are larger than a pencil eraser, or moles that have changed in shape or color.  Stay current with required vaccines (immunizations).  Influenza vaccine. All adults should be immunized every year.  Tetanus, diphtheria, and acellular pertussis (Td, Tdap) vaccine. An adult who has not previously received Tdap or who does not know his vaccine status should receive 1 dose of Tdap. This initial dose should be followed by tetanus and diphtheria toxoids (Td) booster doses every 10 years. Adults with an unknown or incomplete history of completing a 3-dose immunization series with Td-containing vaccines should begin or complete a primary immunization series including a Tdap dose. Adults should receive a Td booster every 10 years.  Varicella vaccine. An adult without evidence of immunity to varicella should receive 2 doses or a second dose if he has previously received 1 dose.  Human papillomavirus (HPV) vaccine. Males aged 18-21 years who have not received the vaccine previously should receive the 3-dose series. Males aged 22-26 years may be immunized. Immunization is recommended through the age of 77 years for any male who has sex with males and did not get any or all doses earlier. Immunization is recommended for any person with an immunocompromised condition through the age of 26 years if he did not get any or all doses earlier. During  the 3-dose series, the second dose should be obtained 4-8 weeks after the first dose. The third dose should be obtained 24 weeks after the first dose and 16 weeks after the second dose.  Zoster vaccine. One dose is recommended for adults aged 38 years or older unless certain conditions are present.  Measles, mumps, and rubella (MMR) vaccine. Adults born before [birth year] generally are considered immune to measles and mumps. Adults born in [birth year] or later should have 1 or more doses of MMR vaccine unless there is a contraindication to the vaccine or there is laboratory evidence of immunity to each of the three diseases. A routine second dose of MMR vaccine should  be obtained at least 28 days after the first dose for students attending postsecondary schools, health care workers, or international travelers. People who received inactivated measles vaccine or an unknown type of measles vaccine during 1963-1967 should receive 2 doses of MMR vaccine. People who received inactivated mumps vaccine or an unknown type of mumps vaccine before 1979 and are at high risk for mumps infection should consider immunization with 2 doses of MMR vaccine. Unvaccinated health care workers born before 60 who lack laboratory evidence of measles, mumps, or rubella immunity or laboratory confirmation of disease should consider measles and mumps immunization with 2 doses of MMR vaccine or rubella immunization with 1 dose of MMR vaccine.  Pneumococcal 13-valent conjugate (PCV13) vaccine. When indicated, a person who is uncertain of his immunization history and has no record of immunization should receive the PCV13 vaccine. An adult aged 4 years or older who has certain medical conditions and has not been previously immunized should receive 1 dose of PCV13 vaccine. This PCV13 should be followed with a dose of pneumococcal polysaccharide (PPSV23) vaccine. The PPSV23 vaccine dose should be obtained at least 8 weeks after the dose of PCV13 vaccine.  An adult aged 41 years or older who has certain medical conditions and previously received 1 or more doses of PPSV23 vaccine should receive 1 dose of PCV13. The PCV13 vaccine dose should be obtained 1 or more years after the last PPSV23 vaccine dose.  Pneumococcal polysaccharide (PPSV23) vaccine. When PCV13 is also indicated, PCV13 should be obtained first. All adults aged 63 years and older should be immunized. An adult younger than age 51 years who has certain medical conditions should be immunized. Any person who resides in a nursing home or long-term care facility should be immunized. An adult smoker should be immunized. People with an immunocompromised condition and certain other conditions should receive both PCV13 and PPSV23 vaccines. People with human immunodeficiency virus (HIV) infection should be immunized as soon as possible after diagnosis. Immunization during chemotherapy or radiation therapy should be avoided. Routine use of PPSV23 vaccine is not recommended for American Indians, [place] Natives, or people younger than 65 years unless there are medical conditions that require PPSV23 vaccine. When indicated, people who have unknown immunization and have no record of immunization should receive PPSV23 vaccine. One-time revaccination 5 years after the first dose of PPSV23 is recommended for people aged 19-64 years who have chronic kidney failure, nephrotic syndrome, asplenia, or immunocompromised conditions. People who received 1-2 doses of PPSV23 before age 67 years should receive another dose of PPSV23 vaccine at age 48 years or later if at least 5 years have passed since the previous dose. Doses of PPSV23 are not needed for people immunized with PPSV23 at or after age 61 years.  Meningococcal vaccine. Adults with asplenia or persistent complement component deficiencies should receive 2 doses of quadrivalent meningococcal conjugate (MenACWY-D) vaccine. The doses should be obtained at least 2 months  apart. Microbiologists working with certain meningococcal bacteria, [place] recruits, people at risk during an outbreak, and people who travel to or live in countries with a high rate of meningitis should be immunized. A first-year college student up through age 17 years who is living in a residence hall should receive a dose if he did not receive a dose on or after his 16th birthday. Adults who have certain high-risk conditions should receive one or more doses of vaccine.  Hepatitis A vaccine. Adults who wish to be protected from this disease, have certain  high-risk conditions, work with hepatitis A-infected animals, work in hepatitis A research labs, or travel to or work in countries with a high rate of hepatitis A should be immunized. Adults who were previously unvaccinated and who anticipate close contact with an international adoptee during the first 60 days after arrival in the [place] from a country with a high rate of hepatitis A should be immunized.  Hepatitis B vaccine. Adults should be immunized if they wish to be protected from this disease, have certain high-risk conditions, may be exposed to blood or other infectious body fluids, are household contacts or sex partners of hepatitis B positive people, are clients or workers in certain care facilities, or travel to or work in countries with a high rate of hepatitis B.  Haemophilus influenzae type b (Hib) vaccine. A previously unvaccinated person with asplenia or sickle cell disease or having a scheduled splenectomy should receive 1 dose of Hib vaccine. Regardless of previous immunization, a recipient of a hematopoietic stem cell transplant should receive a 3-dose series 6-12 months after his successful transplant. Hib vaccine is not recommended for adults with HIV infection. Preventive Service / Frequency Ages 57 to 74  Blood pressure check.** / Every 1 to 2 years.  Lipid and cholesterol check.** / Every 5 years beginning at age  58.  Hepatitis C blood test.** / For any individual with known risks for hepatitis C.  Skin self-exam. / Monthly.  Influenza vaccine. / Every year.  Tetanus, diphtheria, and acellular pertussis (Tdap, Td) vaccine.** / Consult your health care provider. 1 dose of Td every 10 years.  Varicella vaccine.** / Consult your health care provider.  HPV vaccine. / 3 doses over 6 months, if 50 or younger.  Measles, mumps, rubella (MMR) vaccine.** / You need at least 1 dose of MMR if you were born in 1957 or later. You may also need a second dose.  Pneumococcal 13-valent conjugate (PCV13) vaccine.** / Consult your health care provider.  Pneumococcal polysaccharide (PPSV23) vaccine.** / 1 to 2 doses if you smoke cigarettes or if you have certain conditions.  Meningococcal vaccine.** / 1 dose if you are age 53 to 79 years and a Market researcher living in a residence hall, or have one of several medical conditions. You may also need additional booster doses.  Hepatitis A vaccine.** / Consult your health care provider.  Hepatitis B vaccine.** / Consult your health care provider.  Haemophilus influenzae type b (Hib) vaccine.** / Consult your health care provider. Ages 56 to 52  Blood pressure check.** / Every 1 to 2 years.  Lipid and cholesterol check.** / Every 5 years beginning at age 22.  Lung cancer screening. / Every year if you are aged 107-80 years and have a 30-pack-year history of smoking and currently smoke or have quit within the past 15 years. Yearly screening is stopped once you have quit smoking for at least 15 years or develop a health problem that would prevent you from having lung cancer treatment.  Fecal occult blood test (FOBT) of stool. / Every year beginning at age 11 and continuing until age 70. You may not have to do this test if you get a colonoscopy every 10 years.  Flexible sigmoidoscopy** or colonoscopy.** / Every 5 years for a flexible sigmoidoscopy or every  10 years for a colonoscopy beginning at age 45 and continuing until age 49.  Hepatitis C blood test.** / For all people born from [birth year] through 1963 and any individual with known  risks for hepatitis C.  Skin self-exam. / Monthly.  Influenza vaccine. / Every year.  Tetanus, diphtheria, and acellular pertussis (Tdap/Td) vaccine.** / Consult your health care provider. 1 dose of Td every 10 years.  Varicella vaccine.** / Consult your health care provider.  Zoster vaccine.** / 1 dose for adults aged 59 years or older.  Measles, mumps, rubella (MMR) vaccine.** / You need at least 1 dose of MMR if you were born in 1955 or later. You may also need a second dose.  Pneumococcal 13-valent conjugate (PCV13) vaccine.** / Consult your health care provider.  Pneumococcal polysaccharide (PPSV23) vaccine.** / 1 to 2 doses if you smoke cigarettes or if you have certain conditions.  Meningococcal vaccine.** / Consult your health care provider.  Hepatitis A vaccine.** / Consult your health care provider.  Hepatitis B vaccine.** / Consult your health care provider.  Haemophilus influenzae type b (Hib) vaccine.** / Consult your health care provider. Ages 45 and over  Blood pressure check.** / Every 1 to 2 years.  Lipid and cholesterol check.**/ Every 5 years beginning at age 5.  Lung cancer screening. / Every year if you are aged 65-80 years and have a 30-pack-year history of smoking and currently smoke or have quit within the past 15 years. Yearly screening is stopped once you have quit smoking for at least 15 years or develop a health problem that would prevent you from having lung cancer treatment.  Fecal occult blood test (FOBT) of stool. / Every year beginning at age 70 and continuing until age 32. You may not have to do this test if you get a colonoscopy every 10 years.  Flexible sigmoidoscopy** or colonoscopy.** / Every 5 years for a flexible sigmoidoscopy or every 10 years for a colonoscopy  beginning at age 74 and continuing until age 91.  Hepatitis C blood test.** / For all people born from [birth year] through 1963 and any individual with known risks for hepatitis C.  Abdominal aortic aneurysm (AAA) screening.** / A one-time screening for ages 62 to 75 years who are current or former smokers.  Skin self-exam. / Monthly.  Influenza vaccine. / Every year.  Tetanus, diphtheria, and acellular pertussis (Tdap/Td) vaccine.** / 1 dose of Td every 10 years.  Varicella vaccine.** / Consult your health care provider.  Zoster vaccine.** / 1 dose for adults aged 31 years or older.  Pneumococcal 13-valent conjugate (PCV13) vaccine.** / Consult your health care provider.  Pneumococcal polysaccharide (PPSV23) vaccine.** / 1 dose for all adults aged 75 years and older.  Meningococcal vaccine.** / Consult your health care provider.  Hepatitis A vaccine.** / Consult your health care provider.  Hepatitis B vaccine.** / Consult your health care provider.  Haemophilus influenzae type b (Hib) vaccine.** / Consult your health care provider. **Family history and personal history of risk and conditions may change your health care provider's recommendations. Document Released: 04/30/2001 Document Revised: 03/09/2013 Document Reviewed: 07/30/2010 [facility] Patient Information 2015 [place], [place]. This information is not intended to replace advice given to you by your health care provider. Make sure you discuss any questions you have with your health care provider.

## 2014-11-11 NOTE — Progress Notes (Signed)
Patient ID: Vincent Allen, male   DOB: Sep 01, 1933, 81 y.o.   MRN: [id number]   Subjective:    Patient ID: Vincent Allen, male    DOB: 11-27-1933, 81 y.o.   MRN: [id number]  Chief Complaint  Patient presents with  . Medicare Wellness    HPI Patient is in today for annual wellness exam and management of chronic conditions. He feels well today. No recent illness. No acute complaints. No polyuria or polypdipsia. tryin gto minimize simple carbs. Denies CP/palp/SOB/HA/congestion/fevers/GI or GU c/o. Taking meds as prescribed  Past Medical History  Diagnosis Date  . Cataract   . GERD (gastroesophageal reflux disease)   . Diabetes mellitus   . Hypertension   . Hyperlipidemia   . Colon polyps   . Chronic kidney disease stage III (GFR 30-59 ml/min) 05/03/2012  . Irregular heartbeat   . HTN (hypertension) 01/13/2013  . Preventative health care 01/17/2013  . Medicare annual wellness visit, subsequent 01/17/2013    Past Surgical History  Procedure Laterality Date  . Cataract extraction    . Rotator cuff repair    . Ankle arthroplasty    . Tonsillectomy    . Cholecystectomy    . Cholecystectomy N/A 05/03/2012    Procedure: LAPAROSCOPIC CHOLECYSTECTOMY WITH INTRAOPERATIVE CHOLANGIOGRAM;  Surgeon: [name], MD;  Location: [facility];  Service: General;  Laterality: N/A;  . Cataract extraction  02/28/14    Family History  Problem Relation Age of Onset  . Cancer Mother     colon, breast  . Heart disease Father   . Stroke Father   . Cancer Paternal Grandmother     colon    Social History   Social History  . Marital Status: Divorced    Spouse Name: N/A  . Number of Children: N/A  . Years of Education: N/A   Occupational History  . Not on file.   Social History Main Topics  . Smoking status: Never Smoker   . Smokeless tobacco: Never Used  . Alcohol Use: No  . Drug Use: No  . Sexual Activity: Not on file   Other Topics Concern  . Not on file   Social History  Narrative    Outpatient Prescriptions Prior to Visit  Medication Sig Dispense Refill  . amLODipine (NORVASC) 5 MG tablet TAKE 1 TABLET BY MOUTH EVERY DAY 90 tablet 2  . atorvastatin (LIPITOR) 20 MG tablet TAKE 1 TABLET BY MOUTH DAILY 90 tablet 1  . Calcium 600 MG tablet Take 1 tablet (600 mg total) by mouth 2 (two) times daily. 60 tablet 0  . cholecalciferol (VITAMIN D) 400 UNITS TABS Take 400 Units by mouth 2 (two) times daily.    Marland Kitchen glipiZIDE (GLUCOTROL) 10 MG tablet TAKE 2 TABLETS BY MOUTH TWICE A DAY BEFORE MEALS 360 tablet 1  . glucose blood (ONE TOUCH ULTRA TEST) test strip Check blood sugar twice daily. DX:E11.9 100 each 11  . Lancets (ONETOUCH ULTRASOFT) lancets Use as instructed to check blood sugar twice a day as needed Dx Code 250.00 100 each 3  . lisinopril (PRINIVIL,ZESTRIL) 5 MG tablet TAKE 1 TABLET BY MOUTH EVERY DAY 90 tablet 2  . metFORMIN (GLUCOPHAGE-XR) 500 MG 24 hr tablet TAKE 1 TABLET BY MOUTH TWICE A DAY 180 tablet 2  . naproxen sodium (ANAPROX) 220 MG tablet Take 440 mg by mouth 2 (two) times daily as needed (pain).    Marland Kitchen sitaGLIPtin (JANUVIA) 100 MG tablet 1/2 to 1 tab po daily (Patient taking differently:  Take 150 mg by mouth daily. ) 30 tablet 5  . terbinafine (LAMISIL AT) 1 % cream Apply 1 application topically 2 (two) times daily. Tinea pedis 30 g 0  . triamterene-hydrochlorothiazide (MAXZIDE) 75-50 MG per tablet TAKE 1 TABLET BY MOUTH DAILY. 90 tablet 3  . oxyCODONE (OXY IR/ROXICODONE) 5 MG immediate release tablet Take 1 tablet (5 mg total) by mouth every 4 (four) hours as needed for moderate pain. 30 tablet 0   No facility-administered medications prior to visit.    Allergies  Allergen Reactions  . Penicillins Hives and Itching  . Verapamil     Junctional rhythm  . Influenza Vaccines Rash   Preferred Pharmacy and which med where: [id number] - [place], [place] - [address]  Allergies verified:  UTD  Immunization Status: Flu vaccine-- Declines Tdap-- 05/19/14 PNA-- 06/27/11 Shingles-- patient has not had this vaccine.  A/P:  Changes to [place], [place] or Personal Hx: UTD CCS: 06/02/02 at [facility]; polyp found in cecum; no follow-up recommendations listed. PSA: 10/07/12 w/ Dr. [name]; 1.18; normal Bone Density: NA  Care Teams Updated: Dr. Clent Jacks - Opthalmology  Dr. Danella Sensing - Dermatologist  ED/Hospital/Urgent Care Visits: No recent visits to the ED/Hospital or Urgent Care. Review of Systems  Constitutional: Negative for fever, chills and malaise/fatigue.  HENT: Negative for congestion and hearing loss.   Eyes: Negative for discharge.  Respiratory: Negative for cough, sputum production and shortness of breath.   Cardiovascular: Negative for chest pain, palpitations and leg swelling.  Gastrointestinal: Negative for heartburn, nausea, vomiting, abdominal pain, diarrhea, constipation and blood in stool.  Genitourinary: Negative for dysuria, urgency, frequency and hematuria.  Musculoskeletal: Negative for myalgias, back pain and falls.  Skin: Negative for rash.  Neurological: Negative for dizziness, sensory change, loss of consciousness, weakness and headaches.  Endo/Heme/Allergies: Negative for environmental allergies. Does not bruise/bleed easily.  Psychiatric/Behavioral: Negative for depression and suicidal ideas. The patient is not nervous/anxious and does not have insomnia.        Objective:    Physical Exam  Constitutional: He is oriented to person, place, and time. He appears well-developed and well-nourished. No distress.  HENT:  Head: Normocephalic and atraumatic.  Eyes: Conjunctivae are normal.  Neck: Neck supple. No thyromegaly present.  Cardiovascular: Normal rate, regular rhythm and normal heart sounds.   No murmur heard. Pulmonary/Chest: Effort normal and breath sounds normal. No respiratory distress. He has no wheezes.  Abdominal: Soft. Bowel  sounds are normal. He exhibits no mass. There is no tenderness.  Musculoskeletal: He exhibits no edema.  Lymphadenopathy:    He has no cervical adenopathy.  Neurological: He is alert and oriented to person, place, and time.  Skin: Skin is warm and dry.  Psychiatric: He has a normal mood and affect. His behavior is normal.    BP 140/70 mmHg  Pulse 73  Temp(Src) 98.1 F (36.7 C) (Oral)  Wt 230 lb (104.327 kg)  SpO2 96% Wt Readings from Last 3 Encounters:  11/11/14 230 lb (104.327 kg)  06/23/14 226 lb 12.8 oz (102.876 kg)  05/24/14 234 lb 2 oz (106.198 kg)     Lab Results  Component Value Date   WBC 7.2 11/04/2014   HGB 14.4 11/04/2014   HCT 44.5 11/04/2014   PLT 186.0 11/04/2014   GLUCOSE 124* 11/04/2014   CHOL 123 11/04/2014   TRIG 109.0 11/04/2014   HDL 31.60* 11/04/2014   LDLDIRECT 70.8 03/14/2014  LDLCALC 70 11/04/2014   ALT 19 11/04/2014   AST 14 11/04/2014   NA 141 11/04/2014   K 4.2 11/04/2014   CL 109 11/04/2014   CREATININE 1.34 11/04/2014   BUN 31* 11/04/2014   CO2 23 11/04/2014   TSH 2.79 11/04/2014   PSA 1.18 10/07/2012   INR 1.21 05/23/2014   HGBA1C 6.6* 11/04/2014   MICROALBUR 1.01 07/07/2012    Lab Results  Component Value Date   TSH 2.79 11/04/2014   Lab Results  Component Value Date   WBC 7.2 11/04/2014   HGB 14.4 11/04/2014   HCT 44.5 11/04/2014   MCV 85.4 11/04/2014   PLT 186.0 11/04/2014   Lab Results  Component Value Date   NA 141 11/04/2014   K 4.2 11/04/2014   CO2 23 11/04/2014   GLUCOSE 124* 11/04/2014   BUN 31* 11/04/2014   CREATININE 1.34 11/04/2014   BILITOT 0.4 11/04/2014   ALKPHOS 57 11/04/2014   AST 14 11/04/2014   ALT 19 11/04/2014   PROT 7.0 11/04/2014   ALBUMIN 3.9 11/04/2014   CALCIUM 9.2 11/04/2014   ANIONGAP 9 05/23/2014   GFR 54.61* 11/04/2014   Lab Results  Component Value Date   CHOL 123 11/04/2014   Lab Results  Component Value Date   HDL 31.60* 11/04/2014   Lab Results  Component Value Date    LDLCALC 70 11/04/2014   Lab Results  Component Value Date   TRIG 109.0 11/04/2014   Lab Results  Component Value Date   CHOLHDL 4 11/04/2014   Lab Results  Component Value Date   HGBA1C 6.6* 11/04/2014       Assessment & Plan:  Medicare annual wellness visit, subsequent Patient denies any difficulties at home. No trouble with ADLs, depression or falls. No recent changes to vision or hearing. Is UTD with immunizations. Is UTD with screening. Discussed Advanced Directives, patient agrees to bring [place] copies of documents if can. Encouraged heart healthy diet, exercise as tolerated and adequate sleep  Preferred Pharmacy and which med where: [id number] - [place], [place] - [address]  Allergies verified: UTD  Immunization Status: Flu vaccine-- Declines Tdap-- 05/19/14 PNA-- 06/27/11 Shingles-- patient has not had this vaccine.  A/P:  Changes to [place], [place] or Personal Hx: UTD CCS: 06/02/02 at [facility]; polyp found in cecum; no follow-up recommendations listed. PSA: 10/07/12 w/ Dr. [name]; 1.18; normal Bone Density: NA  Care Teams Updated: Dr. Clent Jacks - Opthalmology  Dr. Danella Sensing - Dermatologist  ED/Hospital/Urgent Care Visits: No recent visits to the ED/Hospital or Urgent Care.  HTN (hypertension) Well controlled, no changes to meds. Encouraged heart healthy diet such as the DASH diet and exercise as tolerated.   GERD (gastroesophageal reflux disease) Avoid offending foods, start probiotics. Do not eat large meals in late evening and consider raising head of bed.    I have discontinued Mr. Eisler's oxyCODONE. I am also having him maintain his calcium carbonate, onetouch ultrasoft, cholecalciferol, sitaGLIPtin, terbinafine, glipiZIDE, naproxen sodium, triamterene-hydrochlorothiazide, lisinopril, amLODipine, metFORMIN, glucose blood, and atorvastatin.  No orders of the defined types were placed in this  encounter.     Elizabeth Sauer, LPN

## 2014-11-14 ENCOUNTER — Encounter: Payer: Self-pay | Admitting: Family Medicine

## 2014-12-02 ENCOUNTER — Encounter: Payer: Self-pay | Admitting: Physician Assistant

## 2014-12-02 ENCOUNTER — Ambulatory Visit (INDEPENDENT_AMBULATORY_CARE_PROVIDER_SITE_OTHER): Payer: Medicare Other | Admitting: Physician Assistant

## 2014-12-02 VITALS — BP 128/58 | HR 74 | Temp 98.3°F | Resp 16 | Ht 72.0 in | Wt 229.4 lb

## 2014-12-02 DIAGNOSIS — M545 Low back pain, unspecified: Secondary | ICD-10-CM | POA: Insufficient documentation

## 2014-12-02 LAB — POCT URINALYSIS DIPSTICK
Bilirubin, UA: NEGATIVE
Glucose, UA: NEGATIVE
KETONES UA: NEGATIVE
Leukocytes, UA: NEGATIVE
Nitrite, UA: NEGATIVE
PROTEIN UA: 0.15
RBC UA: NEGATIVE
UROBILINOGEN UA: 4
pH, UA: 6

## 2014-12-02 MED ORDER — DIAZEPAM 2 MG PO TABS: 2.0000 mg | ORAL_TABLET | Freq: Every evening | ORAL | Status: DC

## 2014-12-02 MED ORDER — HYDROCODONE-ACETAMINOPHEN 10-325 MG PO TABS: 1.0000 | ORAL_TABLET | Freq: Three times a day (TID) | ORAL | Status: DC | PRN

## 2014-12-02 NOTE — Progress Notes (Signed)
Pre visit review using our clinic review tool, if applicable. No additional management support is needed unless otherwise documented below in the visit note. 

## 2014-12-02 NOTE — Patient Instructions (Signed)
Youe symptoms and exam seem consistent with muscular strain and spasm. Please limit heavy lifting and take it easy.  Try laying on your other side to get pressure off of the right side of your back.  Take Norco 1/2 tablet every 6 hours if needed for severe pain. Once pain is resolving, switch back to tylenol instead.  Take the Diazepam at bedtime to help with muscle relaxation.  Follow-up next week if symptoms are not resolving.

## 2014-12-02 NOTE — Assessment & Plan Note (Signed)
Urine dip negative. MSK etiology -- suspect strain. Supportive measures reviewed. Salon Pas patches. Rx Norco only if needed for severe pain. Use sparingly. Otherwise Tylenol as directed. Rx low-dose valium to take 1/2 tablet at bedtime for muscle spasms. Follow-up if symptoms are not resolving.

## 2014-12-02 NOTE — Progress Notes (Signed)
Patient presents to clinic today c/o 2 days of R hip and lower back pain that is worse when laying down. Endorses pain is described as aching and pulling and is not radiating into lower extremity. Is taking Aleve with little relief in symptoms. Since last night has noticed some tension in groin.  Denies urinary urgency, frequecy, hematuria, or flank pain.  Denies change to bowel habits. Denies trauma or injury. Body mass index is 31.11 kg/(m^2).   Past Medical History  Diagnosis Date  . Cataract   . GERD (gastroesophageal reflux disease)   . Diabetes mellitus   . Hypertension   . Hyperlipidemia   . Colon polyps   . Chronic kidney disease stage III (GFR 30-59 ml/min) 05/03/2012  . Irregular heartbeat   . HTN (hypertension) 01/13/2013  . Preventative health care 01/17/2013  . Medicare annual wellness visit, subsequent 01/17/2013    Current Outpatient Prescriptions on File Prior to Visit  Medication Sig Dispense Refill  . amLODipine (NORVASC) 5 MG tablet TAKE 1 TABLET BY MOUTH EVERY DAY 90 tablet 2  . atorvastatin (LIPITOR) 20 MG tablet TAKE 1 TABLET BY MOUTH DAILY 90 tablet 1  . Calcium 600 MG tablet Take 1 tablet (600 mg total) by mouth 2 (two) times daily. 60 tablet 0  . cholecalciferol (VITAMIN D) 400 UNITS TABS Take 400 Units by mouth 2 (two) times daily.    Marland Kitchen glipiZIDE (GLUCOTROL) 10 MG tablet TAKE 2 TABLETS BY MOUTH TWICE A DAY BEFORE MEALS 360 tablet 1  . glucose blood (ONE TOUCH ULTRA TEST) test strip Check blood sugar twice daily. DX:E11.9 100 each 11  . Lancets (ONETOUCH ULTRASOFT) lancets Use as instructed to check blood sugar twice a day as needed Dx Code 250.00 100 each 3  . lisinopril (PRINIVIL,ZESTRIL) 5 MG tablet TAKE 1 TABLET BY MOUTH EVERY DAY 90 tablet 2  . metFORMIN (GLUCOPHAGE-XR) 500 MG 24 hr tablet TAKE 1 TABLET BY MOUTH TWICE A DAY 180 tablet 2  . naproxen sodium (ANAPROX) 220 MG tablet Take 440 mg by mouth 2 (two) times daily as needed (pain).    Marland Kitchen sitaGLIPtin  (JANUVIA) 100 MG tablet 1/2 to 1 tab po daily (Patient taking differently: Take 150 mg by mouth daily. ) 30 tablet 5  . terbinafine (LAMISIL AT) 1 % cream Apply 1 application topically 2 (two) times daily. Tinea pedis 30 g 0  . triamterene-hydrochlorothiazide (MAXZIDE) 75-50 MG per tablet TAKE 1 TABLET BY MOUTH DAILY. 90 tablet 3   No current facility-administered medications on file prior to visit.    Allergies  Allergen Reactions  . Penicillins Hives and Itching  . Verapamil     Junctional rhythm  . Influenza Vaccines Rash    Family History  Problem Relation Age of Onset  . Cancer Mother     colon, breast, pancreas, skin cancer  . Heart disease Father     heart valve replaced  . Stroke Father   . Diabetes Father   . Cancer Paternal Grandmother     colon  . Obesity Son   . Heart disease Son     bradycardia    Social History   Social History  . Marital Status: Divorced    Spouse Name: N/A  . Number of Children: N/A  . Years of Education: N/A   Social History Main Topics  . Smoking status: Never Smoker   . Smokeless tobacco: Never Used  . Alcohol Use: No  . Drug Use: No  .  Sexual Activity: Not on file     Comment: lives alone , no major dietary restrictions, 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.   Other Topics Concern  . Not on file   Social History Narrative    Review of Systems - See HPI.  All other ROS are negative.  BP 128/58 mmHg  Pulse 74  Temp(Src) 98.3 F (36.8 C) (Oral)  Resp 16  Ht 6' (1.829 m)  Wt 229 lb 6.4 oz (104.055 kg)  BMI 31.11 kg/m2  SpO2 95%  Physical Exam  Constitutional: He is well-developed, well-nourished, and in no distress.  HENT:  Head: Normocephalic and atraumatic.  Cardiovascular: Normal rate, regular rhythm, normal heart sounds and intact distal pulses.   Pulmonary/Chest: Effort normal.  Musculoskeletal:       Thoracic back: Normal.       Lumbar back: He exhibits tenderness, pain and spasm. He  exhibits no bony tenderness.  Skin: Skin is warm and dry. No rash noted.  Psychiatric: Affect normal.  Vitals reviewed.   Recent Results (from the past 2160 hour(s))  Hemoglobin A1c     Status: Abnormal   Collection Time: 11/04/14  7:49 AM  Result Value Ref Range   Hgb A1c MFr Bld 6.6 (H) 4.6 - 6.5 %    Comment: Glycemic Control Guidelines for People with Diabetes:Non Diabetic:  <6%Goal of Therapy: <7%Additional Action Suggested:  >8%   TSH     Status: None   Collection Time: 11/04/14  7:49 AM  Result Value Ref Range   TSH 2.79 0.35 - 4.50 uIU/mL  CBC     Status: None   Collection Time: 11/04/14  7:49 AM  Result Value Ref Range   WBC 7.2 4.0 - 10.5 K/uL   RBC 5.21 4.22 - 5.81 Mil/uL   Platelets 186.0 150.0 - 400.0 K/uL   Hemoglobin 14.4 13.0 - 17.0 g/dL   HCT 44.5 39.0 - 52.0 %   MCV 85.4 78.0 - 100.0 fl   MCHC 32.2 30.0 - 36.0 g/dL   RDW 13.6 11.5 - 15.5 %  Lipid panel     Status: Abnormal   Collection Time: 11/04/14  7:49 AM  Result Value Ref Range   Cholesterol 123 0 - 200 mg/dL    Comment: ATP III Classification       Desirable:  < 200 mg/dL               Borderline High:  200 - 239 mg/dL          High:  > = 240 mg/dL   Triglycerides 109.0 0.0 - 149.0 mg/dL    Comment: Normal:  <150 mg/dLBorderline High:  150 - 199 mg/dL   HDL 31.60 (L) >39.00 mg/dL   VLDL 21.8 0.0 - 40.0 mg/dL   LDL Cholesterol 70 0 - 99 mg/dL   Total CHOL/HDL Ratio 4     Comment:                Men          Women1/2 Average Risk     3.4          3.3Average Risk          5.0          4.42X Average Risk          9.6          7.13X Average Risk          15.0  11.0                       NonHDL 91.48     Comment: NOTE:  Non-HDL goal should be 30 mg/dL higher than patient's LDL goal (i.e. LDL goal of < 70 mg/dL, would have non-HDL goal of < 100 mg/dL)  Comprehensive metabolic panel     Status: Abnormal   Collection Time: 11/04/14  7:49 AM  Result Value Ref Range   Sodium 141 135 - 145 mEq/L    Potassium 4.2 3.5 - 5.1 mEq/L   Chloride 109 96 - 112 mEq/L   CO2 23 19 - 32 mEq/L   Glucose, Bld 124 (H) 70 - 99 mg/dL   BUN 31 (H) 6 - 23 mg/dL   Creatinine, Ser 1.34 0.40 - 1.50 mg/dL   Total Bilirubin 0.4 0.2 - 1.2 mg/dL   Alkaline Phosphatase 57 39 - 117 U/L   AST 14 0 - 37 U/L   ALT 19 0 - 53 U/L   Total Protein 7.0 6.0 - 8.3 g/dL   Albumin 3.9 3.5 - 5.2 g/dL   Calcium 9.2 8.4 - 10.5 mg/dL   GFR 54.61 (L) >60.00 mL/min  POCT Urinalysis Dipstick     Status: Abnormal   Collection Time: 12/02/14 11:20 AM  Result Value Ref Range   Color, UA yellow    Clarity, UA cear    Glucose, UA neg    Bilirubin, UA neg    Ketones, UA neg    Spec Grav, UA >=1.030    Blood, UA neg    pH, UA 6.0    Protein, UA 0.15    Urobilinogen, UA 4.0    Nitrite, UA neg    Leukocytes, UA Negative Negative    Assessment/Plan: Lumbago Urine dip negative. MSK etiology -- suspect strain. Supportive measures reviewed. Salon Pas patches. Rx Norco only if needed for severe pain. Use sparingly. Otherwise Tylenol as directed. Rx low-dose valium to take 1/2 tablet at bedtime for muscle spasms. Follow-up if symptoms are not resolving.

## 2014-12-13 ENCOUNTER — Other Ambulatory Visit: Payer: Self-pay | Admitting: Family Medicine

## 2015-03-06 ENCOUNTER — Other Ambulatory Visit (INDEPENDENT_AMBULATORY_CARE_PROVIDER_SITE_OTHER): Payer: Medicare Other

## 2015-03-06 DIAGNOSIS — E785 Hyperlipidemia, unspecified: Secondary | ICD-10-CM | POA: Diagnosis not present

## 2015-03-06 DIAGNOSIS — E119 Type 2 diabetes mellitus without complications: Secondary | ICD-10-CM | POA: Diagnosis not present

## 2015-03-06 DIAGNOSIS — I10 Essential (primary) hypertension: Secondary | ICD-10-CM

## 2015-03-06 DIAGNOSIS — E669 Obesity, unspecified: Secondary | ICD-10-CM

## 2015-03-06 DIAGNOSIS — E1169 Type 2 diabetes mellitus with other specified complication: Secondary | ICD-10-CM

## 2015-03-06 DIAGNOSIS — Z0000 Encounter for general adult medical examination without abnormal findings: Secondary | ICD-10-CM

## 2015-03-06 LAB — LIPID PANEL
CHOLESTEROL: 120 mg/dL (ref 0–200)
HDL: 35.2 mg/dL — ABNORMAL LOW (ref >39.00)
LDL CALC: 61 mg/dL (ref 0–99)
NonHDL: 85.27
TRIGLYCERIDES: 119 mg/dL (ref 0.0–149.0)
Total CHOL/HDL Ratio: 3
VLDL: 23.8 mg/dL (ref 0.0–40.0)

## 2015-03-06 LAB — CBC
HCT: 44.8 % (ref 39.0–52.0)
HEMOGLOBIN: 14.4 g/dL (ref 13.0–17.0)
MCHC: 32.2 g/dL (ref 30.0–36.0)
MCV: 86 fl (ref 78.0–100.0)
PLATELETS: 202 10*3/uL (ref 150.0–400.0)
RBC: 5.21 Mil/uL (ref 4.22–5.81)
RDW: 13.6 % (ref 11.5–15.5)
WBC: 8.9 10*3/uL (ref 4.0–10.5)

## 2015-03-06 LAB — COMPREHENSIVE METABOLIC PANEL
ALBUMIN: 3.6 g/dL (ref 3.5–5.2)
ALK PHOS: 57 U/L (ref 39–117)
ALT: 19 U/L (ref 0–53)
AST: 15 U/L (ref 0–37)
BILIRUBIN TOTAL: 0.6 mg/dL (ref 0.2–1.2)
BUN: 30 mg/dL — ABNORMAL HIGH (ref 6–23)
CALCIUM: 9.1 mg/dL (ref 8.4–10.5)
CO2: 23 mEq/L (ref 19–32)
CREATININE: 1.49 mg/dL (ref 0.40–1.50)
Chloride: 109 mEq/L (ref 96–112)
GFR: 48.28 mL/min — AB (ref >60.00)
Glucose, Bld: 94 mg/dL (ref 70–99)
Potassium: 4 mEq/L (ref 3.5–5.1)
Sodium: 140 mEq/L (ref 135–145)
TOTAL PROTEIN: 6.7 g/dL (ref 6.0–8.3)

## 2015-03-06 LAB — TSH: TSH: 3.56 u[IU]/mL (ref 0.35–4.50)

## 2015-03-06 LAB — HEMOGLOBIN A1C: HEMOGLOBIN A1C: 7.2 % — AB (ref 4.6–6.5)

## 2015-03-23 ENCOUNTER — Encounter: Payer: Self-pay | Admitting: Family Medicine

## 2015-03-23 ENCOUNTER — Ambulatory Visit (INDEPENDENT_AMBULATORY_CARE_PROVIDER_SITE_OTHER): Payer: PPO | Admitting: Family Medicine

## 2015-03-23 VITALS — BP 138/84 | HR 81 | Temp 98.7°F | Ht 72.0 in | Wt 230.2 lb

## 2015-03-23 DIAGNOSIS — I10 Essential (primary) hypertension: Secondary | ICD-10-CM

## 2015-03-23 DIAGNOSIS — E1122 Type 2 diabetes mellitus with diabetic chronic kidney disease: Secondary | ICD-10-CM | POA: Diagnosis not present

## 2015-03-23 DIAGNOSIS — E785 Hyperlipidemia, unspecified: Secondary | ICD-10-CM | POA: Diagnosis not present

## 2015-03-23 DIAGNOSIS — E669 Obesity, unspecified: Secondary | ICD-10-CM

## 2015-03-23 DIAGNOSIS — E119 Type 2 diabetes mellitus without complications: Secondary | ICD-10-CM

## 2015-03-23 DIAGNOSIS — K219 Gastro-esophageal reflux disease without esophagitis: Secondary | ICD-10-CM

## 2015-03-23 DIAGNOSIS — M8580 Other specified disorders of bone density and structure, unspecified site: Secondary | ICD-10-CM

## 2015-03-23 DIAGNOSIS — E1169 Type 2 diabetes mellitus with other specified complication: Secondary | ICD-10-CM

## 2015-03-23 DIAGNOSIS — Z23 Encounter for immunization: Secondary | ICD-10-CM | POA: Diagnosis not present

## 2015-03-23 NOTE — Progress Notes (Signed)
Pre visit review using our clinic review tool, if applicable. No additional management support is needed unless otherwise documented below in the visit note. 

## 2015-03-23 NOTE — Patient Instructions (Signed)
Hypertension Hypertension, commonly called high blood pressure, is when the force of blood pumping through your arteries is too strong. Your arteries are the blood vessels that carry blood from your heart throughout your body. A blood pressure reading consists of a higher number over a lower number, such as 110/72. The higher number (systolic) is the pressure inside your arteries when your heart pumps. The lower number (diastolic) is the pressure inside your arteries when your heart relaxes. Ideally you want your blood pressure below 120/80. Hypertension forces your heart to work harder to pump blood. Your arteries may become narrow or stiff. Having untreated or uncontrolled hypertension can cause heart attack, stroke, kidney disease, and other problems. RISK FACTORS Some risk factors for high blood pressure are controllable. Others are not.  Risk factors you cannot control include:   Race. You may be at higher risk if you are African American.  Age. Risk increases with age.  Gender. Men are at higher risk than women before age 45 years. After age 65, women are at higher risk than men. Risk factors you can control include:  Not getting enough exercise or physical activity.  Being overweight.  Getting too much fat, sugar, calories, or salt in your diet.  Drinking too much alcohol. SIGNS AND SYMPTOMS Hypertension does not usually cause signs or symptoms. Extremely high blood pressure (hypertensive crisis) may cause headache, anxiety, shortness of breath, and nosebleed. DIAGNOSIS To check if you have hypertension, your health care provider will measure your blood pressure while you are seated, with your arm held at the level of your heart. It should be measured at least twice using the same arm. Certain conditions can cause a difference in blood pressure between your right and left arms. A blood pressure reading that is higher than normal on one occasion does not mean that you need treatment. If  it is not clear whether you have high blood pressure, you may be asked to return on a different day to have your blood pressure checked again. Or, you may be asked to monitor your blood pressure at home for 1 or more weeks. TREATMENT Treating high blood pressure includes making lifestyle changes and possibly taking medicine. Living a healthy lifestyle can help lower high blood pressure. You may need to change some of your habits. Lifestyle changes may include:  Following the DASH diet. This diet is high in fruits, vegetables, and whole grains. It is low in salt, red meat, and added sugars.  Keep your sodium intake below 2,300 mg per day.  Getting at least 30-45 minutes of aerobic exercise at least 4 times per week.  Losing weight if necessary.  Not smoking.  Limiting alcoholic beverages.  Learning ways to reduce stress. Your health care provider may prescribe medicine if lifestyle changes are not enough to get your blood pressure under control, and if one of the following is true:  You are 18-59 years of age and your systolic blood pressure is above 140.  You are 60 years of age or older, and your systolic blood pressure is above 150.  Your diastolic blood pressure is above 90.  You have diabetes, and your systolic blood pressure is over 140 or your diastolic blood pressure is over 90.  You have kidney disease and your blood pressure is above 140/90.  You have heart disease and your blood pressure is above 140/90. Your personal target blood pressure may vary depending on your medical conditions, your age, and other factors. HOME CARE INSTRUCTIONS    Have your blood pressure rechecked as directed by your health care provider.   Take medicines only as directed by your health care provider. Follow the directions carefully. Blood pressure medicines must be taken as prescribed. The medicine does not work as well when you skip doses. Skipping doses also puts you at risk for  problems.  Do not smoke.   Monitor your blood pressure at home as directed by your health care provider. SEEK MEDICAL CARE IF:   You think you are having a reaction to medicines taken.  You have recurrent headaches or feel dizzy.  You have swelling in your ankles.  You have trouble with your vision. SEEK IMMEDIATE MEDICAL CARE IF:  You develop a severe headache or confusion.  You have unusual weakness, numbness, or feel faint.  You have severe chest or abdominal pain.  You vomit repeatedly.  You have trouble breathing. MAKE SURE YOU:   Understand these instructions.  Will watch your condition.  Will get help right away if you are not doing well or get worse.   This information is not intended to replace advice given to you by your health care provider. Make sure you discuss any questions you have with your health care provider.   Document Released: 03/04/2005 Document Revised: 07/19/2014 Document Reviewed: 12/25/2012 Elsevier Interactive Patient Education 2016 [employer]  

## 2015-04-02 NOTE — Assessment & Plan Note (Signed)
Improved control on recheck, no changes to meds. Encouraged heart healthy diet such as the DASH diet and exercise as tolerated.

## 2015-04-02 NOTE — Assessment & Plan Note (Signed)
Avoid offending foods, start probiotics. Do not eat large meals in late evening and consider raising head of bed.  

## 2015-04-02 NOTE — Assessment & Plan Note (Signed)
Tolerating statin, encouraged heart healthy diet, avoid trans fats, minimize simple carbs and saturated fats. Increase exercise as tolerated 

## 2015-04-02 NOTE — Assessment & Plan Note (Signed)
hgba1c acceptable but mildly increased. minimize simple carbs. Increase exercise as tolerated. Continue current meds

## 2015-04-02 NOTE — Progress Notes (Signed)
Patient ID: Vincent Allen, male   DOB: May 06, 1933, 82 y.o.   MRN: [id number]   Subjective:    Patient ID: Vincent Allen, male    DOB: 15-Jun-1933, 82 y.o.   MRN: [id number]  Chief Complaint  Patient presents with  . Follow-up    HPI Patient is in today for follow-up. Has been feeling fairly well. No recent development of back pain or abdominal pain. No fevers chills. Has had some right great toe pain but denies any trauma or injury. No redness or warmth. Denies CP/palp/SOB/HA/congestion/fevers/GI or GU c/o. Taking meds as prescribed  Past Medical History  Diagnosis Date  . Cataract   . GERD (gastroesophageal reflux disease)   . Diabetes mellitus   . Hypertension   . Hyperlipidemia   . Colon polyps   . Chronic kidney disease stage III (GFR 30-59 ml/min) 05/03/2012  . Irregular heartbeat   . HTN (hypertension) 01/13/2013  . Preventative health care 01/17/2013  . Medicare annual wellness visit, subsequent 01/17/2013    Past Surgical History  Procedure Laterality Date  . Cataract extraction  2010, 2014  . Rotator cuff repair    . Ankle arthroplasty    . Tonsillectomy    . Cholecystectomy    . Cholecystectomy N/A 05/03/2012    Procedure: LAPAROSCOPIC CHOLECYSTECTOMY WITH INTRAOPERATIVE CHOLANGIOGRAM;  Surgeon: [name], MD;  Location: [facility];  Service: General;  Laterality: N/A;  . Cataract extraction  02/28/14    Family History  Problem Relation Age of Onset  . Cancer Mother     colon, breast, pancreas, skin cancer  . Heart disease Father     heart valve replaced  . Stroke Father   . Diabetes Father   . Cancer Paternal Grandmother     colon  . Obesity Son   . Heart disease Son     bradycardia    Social History   Social History  . Marital Status: Divorced    Spouse Name: N/A  . Number of Children: N/A  . Years of Education: N/A   Occupational History  . Not on file.   Social History Main Topics  . Smoking status: Never Smoker   . Smokeless tobacco:  Never Used  . Alcohol Use: No  . Drug Use: No  . Sexual Activity: Not on file     Comment: lives alone , no major dietary restrictions, 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.   Other Topics Concern  . Not on file   Social History Narrative    Outpatient Prescriptions Prior to Visit  Medication Sig Dispense Refill  . amLODipine (NORVASC) 5 MG tablet TAKE 1 TABLET BY MOUTH EVERY DAY 90 tablet 2  . atorvastatin (LIPITOR) 20 MG tablet TAKE 1 TABLET BY MOUTH DAILY 90 tablet 1  . Calcium 600 MG tablet Take 1 tablet (600 mg total) by mouth 2 (two) times daily. 60 tablet 0  . cholecalciferol (VITAMIN D) 400 UNITS TABS Take 400 Units by mouth 2 (two) times daily.    Marland Kitchen glipiZIDE (GLUCOTROL) 10 MG tablet TAKE 2 TABLETS BY MOUTH TWICE A DAY BEFORE MEALS 360 tablet 1  . glucose blood (ONE TOUCH ULTRA TEST) test strip Check blood sugar twice daily. DX:E11.9 100 each 11  . JANUVIA 100 MG tablet TAKE 1/2 TO 1 TABLET DAILY 30 tablet 5  . Lancets (ONETOUCH ULTRASOFT) lancets Use as instructed to check blood sugar twice a day as needed Dx Code 250.00 100 each 3  .  lisinopril (PRINIVIL,ZESTRIL) 5 MG tablet TAKE 1 TABLET BY MOUTH EVERY DAY 90 tablet 2  . metFORMIN (GLUCOPHAGE-XR) 500 MG 24 hr tablet TAKE 1 TABLET BY MOUTH TWICE A DAY 180 tablet 2  . terbinafine (LAMISIL AT) 1 % cream Apply 1 application topically 2 (two) times daily. Tinea pedis 30 g 0  . triamterene-hydrochlorothiazide (MAXZIDE) 75-50 MG per tablet TAKE 1 TABLET BY MOUTH DAILY. 90 tablet 3  . diazepam (VALIUM) 2 MG tablet Take 1 tablet (2 mg total) by mouth every evening. 10 tablet 0  . HYDROcodone-acetaminophen (NORCO) 10-325 MG per tablet Take 1 tablet by mouth every 8 (eight) hours as needed. 30 tablet 0  . naproxen sodium (ANAPROX) 220 MG tablet Take 440 mg by mouth 2 (two) times daily as needed (pain).     No facility-administered medications prior to visit.    Allergies  Allergen Reactions  . Penicillins  Hives and Itching  . Verapamil     Junctional rhythm  . Influenza Vaccines Rash    Review of Systems  Constitutional: Negative for fever and malaise/fatigue.  HENT: Negative for congestion.   Eyes: Negative for discharge.  Respiratory: Negative for shortness of breath.   Cardiovascular: Negative for chest pain, palpitations and leg swelling.  Gastrointestinal: Negative for nausea and abdominal pain.  Genitourinary: Negative for dysuria.  Musculoskeletal: Negative for falls.  Skin: Negative for rash.  Neurological: Negative for loss of consciousness and headaches.  Endo/Heme/Allergies: Negative for environmental allergies.  Psychiatric/Behavioral: Negative for depression. The patient is not nervous/anxious.        Objective:    Physical Exam  Constitutional: He is oriented to person, place, and time. He appears well-developed and well-nourished. No distress.  HENT:  Head: Normocephalic and atraumatic.  Nose: Nose normal.  Eyes: Right eye exhibits no discharge. Left eye exhibits no discharge.  Neck: Normal range of motion. Neck supple.  Cardiovascular: Normal rate and regular rhythm.   No murmur heard. Pulmonary/Chest: Effort normal and breath sounds normal.  Abdominal: Soft. Bowel sounds are normal. There is no tenderness.  Musculoskeletal: He exhibits no edema.  Neurological: He is alert and oriented to person, place, and time.  Skin: Skin is warm and dry.  Psychiatric: He has a normal mood and affect.  Nursing note and vitals reviewed.   BP 138/84 mmHg  Pulse 81  Temp(Src) 98.7 F (37.1 C) (Oral)  Ht 6' (1.829 m)  Wt 230 lb 4 oz (104.441 kg)  BMI 31.22 kg/m2  SpO2 96% Wt Readings from Last 3 Encounters:  03/23/15 230 lb 4 oz (104.441 kg)  12/02/14 229 lb 6.4 oz (104.055 kg)  11/11/14 230 lb (104.327 kg)     Lab Results  Component Value Date   WBC 8.9 03/06/2015   HGB 14.4 03/06/2015   HCT 44.8 03/06/2015   PLT 202.0 03/06/2015   GLUCOSE 94 03/06/2015    CHOL 120 03/06/2015   TRIG 119.0 03/06/2015   HDL 35.20* 03/06/2015   LDLDIRECT 70.8 03/14/2014   LDLCALC 61 03/06/2015   ALT 19 03/06/2015   AST 15 03/06/2015   NA 140 03/06/2015   K 4.0 03/06/2015   CL 109 03/06/2015   CREATININE 1.49 03/06/2015   BUN 30* 03/06/2015   CO2 23 03/06/2015   TSH 3.56 03/06/2015   PSA 1.18 10/07/2012   INR 1.21 05/23/2014   HGBA1C 7.2* 03/06/2015   MICROALBUR 1.01 07/07/2012    Lab Results  Component Value Date   TSH 3.56 03/06/2015   Lab  Results  Component Value Date   WBC 8.9 03/06/2015   HGB 14.4 03/06/2015   HCT 44.8 03/06/2015   MCV 86.0 03/06/2015   PLT 202.0 03/06/2015   Lab Results  Component Value Date   NA 140 03/06/2015   K 4.0 03/06/2015   CO2 23 03/06/2015   GLUCOSE 94 03/06/2015   BUN 30* 03/06/2015   CREATININE 1.49 03/06/2015   BILITOT 0.6 03/06/2015   ALKPHOS 57 03/06/2015   AST 15 03/06/2015   ALT 19 03/06/2015   PROT 6.7 03/06/2015   ALBUMIN 3.6 03/06/2015   CALCIUM 9.1 03/06/2015   ANIONGAP 9 05/23/2014   GFR 48.28* 03/06/2015   Lab Results  Component Value Date   CHOL 120 03/06/2015   Lab Results  Component Value Date   HDL 35.20* 03/06/2015   Lab Results  Component Value Date   LDLCALC 61 03/06/2015   Lab Results  Component Value Date   TRIG 119.0 03/06/2015   Lab Results  Component Value Date   CHOLHDL 3 03/06/2015   Lab Results  Component Value Date   HGBA1C 7.2* 03/06/2015       Assessment & Plan:   Problem List Items Addressed This Visit    Diabetes mellitus type 2 in obese ([place]) - Primary    hgba1c acceptable but mildly increased. minimize simple carbs. Increase exercise as tolerated. Continue current meds      Relevant Orders   TSH   CBC   Hemoglobin A1c   Comprehensive metabolic panel   Lipid panel   VITAMIN D 25 Hydroxy (Vit-D Deficiency, Fractures)   Diabetes mellitus with chronic kidney disease (HCC) (Chronic)   Relevant Orders   TSH   CBC   Hemoglobin A1c    Comprehensive metabolic panel   Lipid panel   VITAMIN D 25 Hydroxy (Vit-D Deficiency, Fractures)   GERD (gastroesophageal reflux disease) (Chronic)    Avoid offending foods, start probiotics. Do not eat large meals in late evening and consider raising head of bed.       HTN (hypertension)    Improved control on recheck, no changes to meds. Encouraged heart healthy diet such as the DASH diet and exercise as tolerated.       Relevant Orders   TSH   CBC   Hemoglobin A1c   Comprehensive metabolic panel   Lipid panel   VITAMIN D 25 Hydroxy (Vit-D Deficiency, Fractures)   Hyperlipidemia (Chronic)    Tolerating statin, encouraged heart healthy diet, avoid trans fats, minimize simple carbs and saturated fats. Increase exercise as tolerated      Relevant Orders   TSH   CBC   Hemoglobin A1c   Comprehensive metabolic panel   Lipid panel   VITAMIN D 25 Hydroxy (Vit-D Deficiency, Fractures)   Osteopenia   Relevant Orders   TSH   CBC   Hemoglobin A1c   Comprehensive metabolic panel   Lipid panel   VITAMIN D 25 Hydroxy (Vit-D Deficiency, Fractures)    Other Visit Diagnoses    Need for viral immunization        Relevant Orders    Varicella Zoster Abs, IgG/IgM    Need for prophylactic vaccination against Streptococcus pneumoniae (pneumococcus)        Relevant Orders    Pneumococcal conjugate vaccine 13-valent (Completed)       I have discontinued Mr. Rosselli's naproxen sodium, HYDROcodone-acetaminophen, and diazepam. I am also having him maintain his calcium carbonate, onetouch ultrasoft, cholecalciferol, terbinafine, triamterene-hydrochlorothiazide, lisinopril, amLODipine, metFORMIN, glucose  blood, atorvastatin, glipiZIDE, and JANUVIA.  No orders of the defined types were placed in this encounter.     [name], MD

## 2015-04-04 DIAGNOSIS — Z961 Presence of intraocular lens: Secondary | ICD-10-CM | POA: Diagnosis not present

## 2015-04-04 DIAGNOSIS — E113291 Type 2 diabetes mellitus with mild nonproliferative diabetic retinopathy without macular edema, right eye: Secondary | ICD-10-CM | POA: Diagnosis not present

## 2015-04-04 DIAGNOSIS — H35371 Puckering of macula, right eye: Secondary | ICD-10-CM | POA: Diagnosis not present

## 2015-04-04 DIAGNOSIS — H02831 Dermatochalasis of right upper eyelid: Secondary | ICD-10-CM | POA: Diagnosis not present

## 2015-04-04 DIAGNOSIS — H02834 Dermatochalasis of left upper eyelid: Secondary | ICD-10-CM | POA: Diagnosis not present

## 2015-04-04 DIAGNOSIS — H04123 Dry eye syndrome of bilateral lacrimal glands: Secondary | ICD-10-CM | POA: Diagnosis not present

## 2015-04-04 DIAGNOSIS — E133291 Other specified diabetes mellitus with mild nonproliferative diabetic retinopathy without macular edema, right eye: Secondary | ICD-10-CM | POA: Diagnosis not present

## 2015-04-04 LAB — HM DIABETES EYE EXAM

## 2015-04-06 ENCOUNTER — Other Ambulatory Visit: Payer: Self-pay | Admitting: Family Medicine

## 2015-04-06 ENCOUNTER — Other Ambulatory Visit: Payer: PPO

## 2015-04-06 DIAGNOSIS — D649 Anemia, unspecified: Secondary | ICD-10-CM

## 2015-04-07 ENCOUNTER — Other Ambulatory Visit (INDEPENDENT_AMBULATORY_CARE_PROVIDER_SITE_OTHER): Payer: PPO

## 2015-04-07 DIAGNOSIS — D649 Anemia, unspecified: Secondary | ICD-10-CM

## 2015-04-07 LAB — FECAL OCCULT BLOOD, IMMUNOCHEMICAL: FECAL OCCULT BLD: NEGATIVE

## 2015-04-11 ENCOUNTER — Encounter: Payer: Self-pay | Admitting: Family Medicine

## 2015-04-20 ENCOUNTER — Other Ambulatory Visit: Payer: Self-pay | Admitting: Family Medicine

## 2015-05-23 ENCOUNTER — Other Ambulatory Visit: Payer: Self-pay | Admitting: Family Medicine

## 2015-06-28 ENCOUNTER — Other Ambulatory Visit: Payer: Self-pay | Admitting: Family Medicine

## 2015-07-10 ENCOUNTER — Encounter: Payer: Self-pay | Admitting: Family Medicine

## 2015-07-13 ENCOUNTER — Other Ambulatory Visit: Payer: PPO

## 2015-07-13 ENCOUNTER — Other Ambulatory Visit (INDEPENDENT_AMBULATORY_CARE_PROVIDER_SITE_OTHER): Payer: PPO

## 2015-07-13 DIAGNOSIS — E1169 Type 2 diabetes mellitus with other specified complication: Secondary | ICD-10-CM

## 2015-07-13 DIAGNOSIS — E1122 Type 2 diabetes mellitus with diabetic chronic kidney disease: Secondary | ICD-10-CM

## 2015-07-13 DIAGNOSIS — E669 Obesity, unspecified: Secondary | ICD-10-CM | POA: Diagnosis not present

## 2015-07-13 DIAGNOSIS — E785 Hyperlipidemia, unspecified: Secondary | ICD-10-CM | POA: Diagnosis not present

## 2015-07-13 DIAGNOSIS — M8580 Other specified disorders of bone density and structure, unspecified site: Secondary | ICD-10-CM

## 2015-07-13 DIAGNOSIS — E119 Type 2 diabetes mellitus without complications: Secondary | ICD-10-CM | POA: Diagnosis not present

## 2015-07-13 DIAGNOSIS — I10 Essential (primary) hypertension: Secondary | ICD-10-CM | POA: Diagnosis not present

## 2015-07-13 LAB — COMPREHENSIVE METABOLIC PANEL
ALBUMIN: 3.7 g/dL (ref 3.5–5.2)
ALK PHOS: 55 U/L (ref 39–117)
ALT: 18 U/L (ref 0–53)
AST: 15 U/L (ref 0–37)
BUN: 30 mg/dL — ABNORMAL HIGH (ref 6–23)
CALCIUM: 8.9 mg/dL (ref 8.4–10.5)
CHLORIDE: 109 meq/L (ref 96–112)
CO2: 21 mEq/L (ref 19–32)
CREATININE: 1.47 mg/dL (ref 0.40–1.50)
GFR: 48.99 mL/min — ABNORMAL LOW (ref >60.00)
Glucose, Bld: 103 mg/dL — ABNORMAL HIGH (ref 70–99)
Potassium: 3.8 mEq/L (ref 3.5–5.1)
Sodium: 139 mEq/L (ref 135–145)
Total Bilirubin: 0.6 mg/dL (ref 0.2–1.2)
Total Protein: 6.7 g/dL (ref 6.0–8.3)

## 2015-07-13 LAB — CBC
HCT: 43.1 % (ref 39.0–52.0)
HEMOGLOBIN: 14.1 g/dL (ref 13.0–17.0)
MCHC: 32.8 g/dL (ref 30.0–36.0)
MCV: 83.8 fl (ref 78.0–100.0)
Platelets: 198 10*3/uL (ref 150.0–400.0)
RBC: 5.14 Mil/uL (ref 4.22–5.81)
RDW: 13.7 % (ref 11.5–15.5)
WBC: 8.9 10*3/uL (ref 4.0–10.5)

## 2015-07-13 LAB — LIPID PANEL
CHOLESTEROL: 118 mg/dL (ref 0–200)
HDL: 30.8 mg/dL — ABNORMAL LOW (ref >39.00)
LDL CALC: 58 mg/dL (ref 0–99)
NonHDL: 87.43
TRIGLYCERIDES: 146 mg/dL (ref 0.0–149.0)
Total CHOL/HDL Ratio: 4
VLDL: 29.2 mg/dL (ref 0.0–40.0)

## 2015-07-13 LAB — TSH: TSH: 2.52 u[IU]/mL (ref 0.35–4.50)

## 2015-07-13 LAB — VITAMIN D 25 HYDROXY (VIT D DEFICIENCY, FRACTURES): VITD: 25.45 ng/mL — ABNORMAL LOW (ref 30.00–100.00)

## 2015-07-13 LAB — HEMOGLOBIN A1C: Hgb A1c MFr Bld: 7.1 % — ABNORMAL HIGH (ref 4.6–6.5)

## 2015-07-17 ENCOUNTER — Ambulatory Visit (INDEPENDENT_AMBULATORY_CARE_PROVIDER_SITE_OTHER): Payer: PPO | Admitting: Family Medicine

## 2015-07-17 ENCOUNTER — Encounter: Payer: Self-pay | Admitting: Family Medicine

## 2015-07-17 VITALS — BP 138/68 | HR 71 | Temp 98.5°F | Ht 72.0 in | Wt 231.1 lb

## 2015-07-17 DIAGNOSIS — E785 Hyperlipidemia, unspecified: Secondary | ICD-10-CM

## 2015-07-17 DIAGNOSIS — M8580 Other specified disorders of bone density and structure, unspecified site: Secondary | ICD-10-CM | POA: Diagnosis not present

## 2015-07-17 DIAGNOSIS — I10 Essential (primary) hypertension: Secondary | ICD-10-CM

## 2015-07-17 DIAGNOSIS — K219 Gastro-esophageal reflux disease without esophagitis: Secondary | ICD-10-CM

## 2015-07-17 DIAGNOSIS — E669 Obesity, unspecified: Secondary | ICD-10-CM

## 2015-07-17 DIAGNOSIS — E559 Vitamin D deficiency, unspecified: Secondary | ICD-10-CM | POA: Insufficient documentation

## 2015-07-17 DIAGNOSIS — E1122 Type 2 diabetes mellitus with diabetic chronic kidney disease: Secondary | ICD-10-CM

## 2015-07-17 DIAGNOSIS — Z0000 Encounter for general adult medical examination without abnormal findings: Secondary | ICD-10-CM | POA: Diagnosis not present

## 2015-07-17 DIAGNOSIS — E1169 Type 2 diabetes mellitus with other specified complication: Secondary | ICD-10-CM

## 2015-07-17 HISTORY — DX: Vitamin D deficiency, unspecified: E55.9

## 2015-07-17 MED ORDER — VITAMIN D (ERGOCALCIFEROL) 1.25 MG (50000 UNIT) PO CAPS: 50000.0000 [IU] | ORAL_CAPSULE | ORAL | Status: DC

## 2015-07-17 NOTE — Assessment & Plan Note (Signed)
BS 114 today, can go as high 180. hgba1c acceptable, minimize simple carbs. Increase exercise as tolerated. Continue current meds

## 2015-07-17 NOTE — Patient Instructions (Signed)
Bring [place] a copy of your advance directive to put in computer. [employer] offers reasonable priced hearing aids. Preventive Care for Adults, Male A healthy lifestyle and preventive care can promote health and wellness. Preventive health guidelines for men include the following key practices:  A routine yearly physical is a good way to check with your health care provider about your health and preventative screening. It is a chance to share any concerns and updates on your health and to receive a thorough exam.  Visit your dentist for a routine exam and preventative care every 6 months. Brush your teeth twice a day and floss once a day. Good oral hygiene prevents tooth decay and gum disease.  The frequency of eye exams is based on your age, health, family medical history, use of contact lenses, and other factors. Follow your health care provider's recommendations for frequency of eye exams.  Eat a healthy diet. Foods such as vegetables, fruits, whole grains, low-fat dairy products, and lean protein foods contain the nutrients you need without too many calories. Decrease your intake of foods high in solid fats, added sugars, and salt. Eat the right amount of calories for you.Get information about a proper diet from your health care provider, if necessary.  Regular physical exercise is one of the most important things you can do for your health. Most adults should get at least 150 minutes of moderate-intensity exercise (any activity that increases your heart rate and causes you to sweat) each week. In addition, most adults need muscle-strengthening exercises on 2 or more days a week.  Maintain a healthy weight. The body mass index (BMI) is a screening tool to identify possible weight problems. It provides an estimate of body fat based on height and weight. Your health care provider can find your BMI and can help you achieve or maintain a healthy weight.For adults 20 years and older:  A BMI below 18.5 is  considered underweight.  A BMI of 18.5 to 24.9 is normal.  A BMI of 25 to 29.9 is considered overweight.  A BMI of 30 and above is considered obese.  Maintain normal blood lipids and cholesterol levels by exercising and minimizing your intake of saturated fat. Eat a balanced diet with plenty of fruit and vegetables. Blood tests for lipids and cholesterol should begin at age 53 and be repeated every 5 years. If your lipid or cholesterol levels are high, you are over 50, or you are at high risk for heart disease, you may need your cholesterol levels checked more frequently.Ongoing high lipid and cholesterol levels should be treated with medicines if diet and exercise are not working.  If you smoke, find out from your health care provider how to quit. If you do not use tobacco, do not start.  Lung cancer screening is recommended for adults aged 59-80 years who are at high risk for developing lung cancer because of a history of smoking. A yearly low-dose CT scan of the lungs is recommended for people who have at least a 30-pack-year history of smoking and are a current smoker or have quit within the past 15 years. A pack year of smoking is smoking an average of 1 pack of cigarettes a day for 1 year (for example: 1 pack a day for 30 years or 2 packs a day for 15 years). Yearly screening should continue until the smoker has stopped smoking for at least 15 years. Yearly screening should be stopped for people who develop a health problem that would  prevent them from having lung cancer treatment.  If you choose to drink alcohol, do not have more than 2 drinks per day. One drink is considered to be 12 ounces (355 mL) of beer, 5 ounces (148 mL) of wine, or 1.5 ounces (44 mL) of liquor.  Avoid use of street drugs. Do not share needles with anyone. Ask for help if you need support or instructions about stopping the use of drugs.  High blood pressure causes heart disease and increases the risk of stroke. Your  blood pressure should be checked at least every 1-2 years. Ongoing high blood pressure should be treated with medicines, if weight loss and exercise are not effective.  If you are 33-42 years old, ask your health care provider if you should take aspirin to prevent heart disease.  Diabetes screening is done by taking a blood sample to check your blood glucose level after you have not eaten for a certain period of time (fasting). If you are not overweight and you do not have risk factors for diabetes, you should be screened once every 3 years starting at age 82. If you are overweight or obese and you are 55-57 years of age, you should be screened for diabetes every year as part of your cardiovascular risk assessment.  Colorectal cancer can be detected and often prevented. Most routine colorectal cancer screening begins at the age of 59 and continues through age 26. However, your health care provider may recommend screening at an earlier age if you have risk factors for colon cancer. On a yearly basis, your health care provider may provide home test kits to check for hidden blood in the stool. Use of a small camera at the end of a tube to directly examine the colon (sigmoidoscopy or colonoscopy) can detect the earliest forms of colorectal cancer. Talk to your health care provider about this at age 51, when routine screening begins. Direct exam of the colon should be repeated every 5-10 years through age 68, unless early forms of precancerous polyps or small growths are found.  People who are at an increased risk for hepatitis B should be screened for this virus. You are considered at high risk for hepatitis B if:  You were born in a country where hepatitis B occurs often. Talk with your health care provider about which countries are considered high risk.  Your parents were born in a high-risk country and you have not received a shot to protect against hepatitis B (hepatitis B vaccine).  You have HIV or  AIDS.  You use needles to inject street drugs.  You live with, or have sex with, someone who has hepatitis B.  You are a man who has sex with other men (MSM).  You get hemodialysis treatment.  You take certain medicines for conditions such as cancer, organ transplantation, and autoimmune conditions.  Hepatitis C blood testing is recommended for all people born from [birth year] through 1965 and any individual with known risks for hepatitis C.  Practice safe sex. Use condoms and avoid high-risk sexual practices to reduce the spread of sexually transmitted infections (STIs). STIs include gonorrhea, chlamydia, syphilis, trichomonas, herpes, HPV, and human immunodeficiency virus (HIV). Herpes, HIV, and HPV are viral illnesses that have no cure. They can result in disability, cancer, and death.  If you are a man who has sex with other men, you should be screened at least once per year for:  HIV.  Urethral, rectal, and pharyngeal infection of gonorrhea, chlamydia, or both.  If you are at risk of being infected with HIV, it is recommended that you take a prescription medicine daily to prevent HIV infection. This is called preexposure prophylaxis (PrEP). You are considered at risk if:  You are a man who has sex with other men (MSM) and have other risk factors.  You are a heterosexual man, are sexually active, and are at increased risk for HIV infection.  You take drugs by injection.  You are sexually active with a partner who has HIV.  Talk with your health care provider about whether you are at high risk of being infected with HIV. If you choose to begin PrEP, you should first be tested for HIV. You should then be tested every 3 months for as long as you are taking PrEP.  A one-time screening for abdominal aortic aneurysm (AAA) and surgical repair of large AAAs by ultrasound are recommended for men ages 68 to 81 years who are current or former smokers.  Healthy men should no longer receive  prostate-specific antigen (PSA) blood tests as part of routine cancer screening. Talk with your health care provider about prostate cancer screening.  Testicular cancer screening is not recommended for adult males who have no symptoms. Screening includes self-exam, a health care provider exam, and other screening tests. Consult with your health care provider about any symptoms you have or any concerns you have about testicular cancer.  Use sunscreen. Apply sunscreen liberally and repeatedly throughout the day. You should seek shade when your shadow is shorter than you. Protect yourself by wearing long sleeves, pants, a wide-brimmed hat, and sunglasses year round, whenever you are outdoors.  Once a month, do a whole-body skin exam, using a mirror to look at the skin on your back. Tell your health care provider about new moles, moles that have irregular borders, moles that are larger than a pencil eraser, or moles that have changed in shape or color.  Stay current with required vaccines (immunizations).  Influenza vaccine. All adults should be immunized every year.  Tetanus, diphtheria, and acellular pertussis (Td, Tdap) vaccine. An adult who has not previously received Tdap or who does not know his vaccine status should receive 1 dose of Tdap. This initial dose should be followed by tetanus and diphtheria toxoids (Td) booster doses every 10 years. Adults with an unknown or incomplete history of completing a 3-dose immunization series with Td-containing vaccines should begin or complete a primary immunization series including a Tdap dose. Adults should receive a Td booster every 10 years.  Varicella vaccine. An adult without evidence of immunity to varicella should receive 2 doses or a second dose if he has previously received 1 dose.  Human papillomavirus (HPV) vaccine. Males aged 11-21 years who have not received the vaccine previously should receive the 3-dose series. Males aged 22-26 years may be  immunized. Immunization is recommended through the age of 32 years for any male who has sex with males and did not get any or all doses earlier. Immunization is recommended for any person with an immunocompromised condition through the age of 26 years if he did not get any or all doses earlier. During the 3-dose series, the second dose should be obtained 4-8 weeks after the first dose. The third dose should be obtained 24 weeks after the first dose and 16 weeks after the second dose.  Zoster vaccine. One dose is recommended for adults aged 29 years or older unless certain conditions are present.  Measles, mumps, and rubella (MMR)  vaccine. Adults born before [birth year] generally are considered immune to measles and mumps. Adults born in [birth year] or later should have 1 or more doses of MMR vaccine unless there is a contraindication to the vaccine or there is laboratory evidence of immunity to each of the three diseases. A routine second dose of MMR vaccine should be obtained at least 28 days after the first dose for students attending postsecondary schools, health care workers, or international travelers. People who received inactivated measles vaccine or an unknown type of measles vaccine during 1963-1967 should receive 2 doses of MMR vaccine. People who received inactivated mumps vaccine or an unknown type of mumps vaccine before 1979 and are at high risk for mumps infection should consider immunization with 2 doses of MMR vaccine. Unvaccinated health care workers born before [birth year] who lack laboratory evidence of measles, mumps, or rubella immunity or laboratory confirmation of disease should consider measles and mumps immunization with 2 doses of MMR vaccine or rubella immunization with 1 dose of MMR vaccine.  Pneumococcal 13-valent conjugate (PCV13) vaccine. When indicated, a person who is uncertain of his immunization history and has no record of immunization should receive the PCV13 vaccine. All adults 35 years of  age and older should receive this vaccine. An adult aged 2 years or older who has certain medical conditions and has not been previously immunized should receive 1 dose of PCV13 vaccine. This PCV13 should be followed with a dose of pneumococcal polysaccharide (PPSV23) vaccine. Adults who are at high risk for pneumococcal disease should obtain the PPSV23 vaccine at least 8 weeks after the dose of PCV13 vaccine. Adults older than 80 years of age who have normal immune system function should obtain the PPSV23 vaccine dose at least 1 year after the dose of PCV13 vaccine.  Pneumococcal polysaccharide (PPSV23) vaccine. When PCV13 is also indicated, PCV13 should be obtained first. All adults aged 58 years and older should be immunized. An adult younger than age 6 years who has certain medical conditions should be immunized. Any person who resides in a nursing home or long-term care facility should be immunized. An adult smoker should be immunized. People with an immunocompromised condition and certain other conditions should receive both PCV13 and PPSV23 vaccines. People with human immunodeficiency virus (HIV) infection should be immunized as soon as possible after diagnosis. Immunization during chemotherapy or radiation therapy should be avoided. Routine use of PPSV23 vaccine is not recommended for American Indians, [place] Natives, or people younger than 65 years unless there are medical conditions that require PPSV23 vaccine. When indicated, people who have unknown immunization and have no record of immunization should receive PPSV23 vaccine. One-time revaccination 5 years after the first dose of PPSV23 is recommended for people aged 19-64 years who have chronic kidney failure, nephrotic syndrome, asplenia, or immunocompromised conditions. People who received 1-2 doses of PPSV23 before age 15 years should receive another dose of PPSV23 vaccine at age 51 years or later if at least 5 years have passed since the  previous dose. Doses of PPSV23 are not needed for people immunized with PPSV23 at or after age 67 years.  Meningococcal vaccine. Adults with asplenia or persistent complement component deficiencies should receive 2 doses of quadrivalent meningococcal conjugate (MenACWY-D) vaccine. The doses should be obtained at least 2 months apart. Microbiologists working with certain meningococcal bacteria, [place] recruits, people at risk during an outbreak, and people who travel to or live in countries with a high rate of meningitis should be immunized. A first-year  college student up through age 54 years who is living in a residence hall should receive a dose if he did not receive a dose on or after his 16th birthday. Adults who have certain high-risk conditions should receive one or more doses of vaccine.  Hepatitis A vaccine. Adults who wish to be protected from this disease, have chronic liver disease, work with hepatitis A-infected animals, work in hepatitis A research labs, or travel to or work in countries with a high rate of hepatitis A should be immunized. Adults who were previously unvaccinated and who anticipate close contact with an international adoptee during the first 60 days after arrival in the [place] from a country with a high rate of hepatitis A should be immunized.  Hepatitis B vaccine. Adults should be immunized if they wish to be protected from this disease, are under age 78 years and have diabetes, have chronic liver disease, have had more than one sex partner in the past 6 months, may be exposed to blood or other infectious body fluids, are household contacts or sex partners of hepatitis B positive people, are clients or workers in certain care facilities, or travel to or work in countries with a high rate of hepatitis B.  Haemophilus influenzae type b (Hib) vaccine. A previously unvaccinated person with asplenia or sickle cell disease or having a scheduled splenectomy should receive 1  dose of Hib vaccine. Regardless of previous immunization, a recipient of a hematopoietic stem cell transplant should receive a 3-dose series 6-12 months after his successful transplant. Hib vaccine is not recommended for adults with HIV infection. Preventive Service / Frequency Ages 70 to 81  Blood pressure check.** / Every 3-5 years.  Lipid and cholesterol check.** / Every 5 years beginning at age 37.  Hepatitis C blood test.** / For any individual with known risks for hepatitis C.  Skin self-exam. / Monthly.  Influenza vaccine. / Every year.  Tetanus, diphtheria, and acellular pertussis (Tdap, Td) vaccine.** / Consult your health care provider. 1 dose of Td every 10 years.  Varicella vaccine.** / Consult your health care provider.  HPV vaccine. / 3 doses over 6 months, if 29 or younger.  Measles, mumps, rubella (MMR) vaccine.** / You need at least 1 dose of MMR if you were born in 1957 or later. You may also need a second dose.  Pneumococcal 13-valent conjugate (PCV13) vaccine.** / Consult your health care provider.  Pneumococcal polysaccharide (PPSV23) vaccine.** / 1 to 2 doses if you smoke cigarettes or if you have certain conditions.  Meningococcal vaccine.** / 1 dose if you are age 52 to 69 years and a [employer] researcher living in a residence hall, or have one of several medical conditions. You may also need additional booster doses.  Hepatitis A vaccine.** / Consult your health care provider.  Hepatitis B vaccine.** / Consult your health care provider.  Haemophilus influenzae type b (Hib) vaccine.** / Consult your health care provider. Ages 76 to 58  Blood pressure check.** / Every year.  Lipid and cholesterol check.** / Every 5 years beginning at age 58.  Lung cancer screening. / Every year if you are aged 36-80 years and have a 30-pack-year history of smoking and currently smoke or have quit within the past 15 years. Yearly screening is stopped once you have  quit smoking for at least 15 years or develop a health problem that would prevent you from having lung cancer treatment.  Fecal occult blood test (FOBT) of stool. /  Every year beginning at age 78 and continuing until age 71. You may not have to do this test if you get a colonoscopy every 10 years.  Flexible sigmoidoscopy** or colonoscopy.** / Every 5 years for a flexible sigmoidoscopy or every 10 years for a colonoscopy beginning at age 27 and continuing until age 40.  Hepatitis C blood test.** / For all people born from [birth year] through 1965 and any individual with known risks for hepatitis C.  Skin self-exam. / Monthly.  Influenza vaccine. / Every year.  Tetanus, diphtheria, and acellular pertussis (Tdap/Td) vaccine.** / Consult your health care provider. 1 dose of Td every 10 years.  Varicella vaccine.** / Consult your health care provider.  Zoster vaccine.** / 1 dose for adults aged 63 years or older.  Measles, mumps, rubella (MMR) vaccine.** / You need at least 1 dose of MMR if you were born in 1957 or later. You may also need a second dose.  Pneumococcal 13-valent conjugate (PCV13) vaccine.** / Consult your health care provider.  Pneumococcal polysaccharide (PPSV23) vaccine.** / 1 to 2 doses if you smoke cigarettes or if you have certain conditions.  Meningococcal vaccine.** / Consult your health care provider.  Hepatitis A vaccine.** / Consult your health care provider.  Hepatitis B vaccine.** / Consult your health care provider.  Haemophilus influenzae type b (Hib) vaccine.** / Consult your health care provider. Ages 81 and over  Blood pressure check.** / Every year.  Lipid and cholesterol check.**/ Every 5 years beginning at age 27.  Lung cancer screening. / Every year if you are aged 59-80 years and have a 30-pack-year history of smoking and currently smoke or have quit within the past 15 years. Yearly screening is stopped once you have quit smoking for at least 15 years or  develop a health problem that would prevent you from having lung cancer treatment.  Fecal occult blood test (FOBT) of stool. / Every year beginning at age 55 and continuing until age 90. You may not have to do this test if you get a colonoscopy every 10 years.  Flexible sigmoidoscopy** or colonoscopy.** / Every 5 years for a flexible sigmoidoscopy or every 10 years for a colonoscopy beginning at age 7 and continuing until age 86.  Hepatitis C blood test.** / For all people born from [birth year] through 1963 and any individual with known risks for hepatitis C.  Abdominal aortic aneurysm (AAA) screening.** / A one-time screening for ages 60 to 46 years who are current or former smokers.  Skin self-exam. / Monthly.  Influenza vaccine. / Every year.  Tetanus, diphtheria, and acellular pertussis (Tdap/Td) vaccine.** / 1 dose of Td every 10 years.  Varicella vaccine.** / Consult your health care provider.  Zoster vaccine.** / 1 dose for adults aged 21 years or older.  Pneumococcal 13-valent conjugate (PCV13) vaccine.** / 1 dose for all adults aged 68 years and older.  Pneumococcal polysaccharide (PPSV23) vaccine.** / 1 dose for all adults aged 71 years and older.  Meningococcal vaccine.** / Consult your health care provider.  Hepatitis A vaccine.** / Consult your health care provider.  Hepatitis B vaccine.** / Consult your health care provider.  Haemophilus influenzae type b (Hib) vaccine.** / Consult your health care provider. **Family history and personal history of risk and conditions may change your health care provider's recommendations.   This information is not intended to replace advice given to you by your health care provider. Make sure you discuss any questions you have with your health care  provider.   Document Released: 04/30/2001 Document Revised: 03/25/2014 Document Reviewed: 07/30/2010 Elsevier Interactive Patient Education [employer].

## 2015-07-17 NOTE — Progress Notes (Signed)
Pre visit review using our clinic review tool, if applicable. No additional management support is needed unless otherwise documented below in the visit note. 

## 2015-07-17 NOTE — Assessment & Plan Note (Signed)
Encouraged to get adequate exercise, calcium and vitamin d intake 

## 2015-07-17 NOTE — Progress Notes (Signed)
Subjective:    Patient ID: Vincent Allen, male    DOB: 01-31-1934, 82 y.o.   MRN: [id number]  Chief Complaint  Patient presents with  . Annual Exam    HPI Patient is in today for Annual Physical Exam.  Patient presents doing well, has some difficulty with hearing. Blood sugar levels have been running around 115.  No polyuria or polydipsia. No recent illness or acute concerns. Is doing well with ADLs Denies CP/palp/SOB/HA/congestion/fevers/GI or GU c/o. Taking meds as prescribed  Past Medical History  Diagnosis Date  . Cataract   . GERD (gastroesophageal reflux disease)   . Diabetes mellitus   . Hypertension   . Hyperlipidemia   . Colon polyps   . Chronic kidney disease stage III (GFR 30-59 ml/min) 05/03/2012  . Irregular heartbeat   . HTN (hypertension) 01/13/2013  . Preventative health care 01/17/2013  . Medicare annual wellness visit, subsequent 01/17/2013  . Vitamin D deficiency 07/17/2015  . Preventative health care 07/17/2015    Past Surgical History  Procedure Laterality Date  . Cataract extraction  2010, 2014  . Rotator cuff repair    . Ankle arthroplasty    . Tonsillectomy    . Cholecystectomy    . Cholecystectomy N/A 05/03/2012    Procedure: LAPAROSCOPIC CHOLECYSTECTOMY WITH INTRAOPERATIVE CHOLANGIOGRAM;  Surgeon: [name], MD;  Location: [facility];  Service: General;  Laterality: N/A;  . Cataract extraction  02/28/14    Family History  Problem Relation Age of Onset  . Cancer Mother     colon, breast, pancreas, skin cancer  . Heart disease Father     heart valve replaced  . Stroke Father   . Diabetes Father   . Cancer Paternal Grandmother     colon  . Obesity Son   . Heart disease Son     bradycardia    Social History   Social History  . Marital Status: Divorced    Spouse Name: N/A  . Number of Children: N/A  . Years of Education: N/A   Occupational History  . Not on file.   Social History Main Topics  . Smoking status: Never Smoker   .  Smokeless tobacco: Never Used  . Alcohol Use: No  . Drug Use: No  . Sexual Activity: Not on file     Comment: lives alone , no major dietary restrictions, 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.   Other Topics Concern  . Not on file   Social History Narrative    Outpatient Prescriptions Prior to Visit  Medication Sig Dispense Refill  . amLODipine (NORVASC) 5 MG tablet TAKE 1 TABLET BY MOUTH EVERY DAY 90 tablet 2  . atorvastatin (LIPITOR) 20 MG tablet TAKE 1 TABLET BY MOUTH DAILY 90 tablet 3  . Calcium 600 MG tablet Take 1 tablet (600 mg total) by mouth 2 (two) times daily. 60 tablet 0  . cholecalciferol (VITAMIN D) 400 UNITS TABS Take 400 Units by mouth 2 (two) times daily.    Marland Kitchen glipiZIDE (GLUCOTROL) 10 MG tablet TAKE 2 TABLETS BY MOUTH TWICE A DAY BEFORE MEALS 360 tablet 1  . glucose blood (ONE TOUCH ULTRA TEST) test strip Check blood sugar twice daily. DX:E11.9 100 each 11  . JANUVIA 100 MG tablet TAKE 1/2 TO 1 TABLET DAILY 30 tablet 5  . Lancets (ONETOUCH ULTRASOFT) lancets Use as instructed to check blood sugar twice a day as needed Dx Code 250.00 100 each 3  . lisinopril (  PRINIVIL,ZESTRIL) 5 MG tablet TAKE 1 TABLET BY MOUTH EVERY DAY 90 tablet 2  . metFORMIN (GLUCOPHAGE-XR) 500 MG 24 hr tablet TAKE 1 TABLET BY MOUTH TWICE A DAY 180 tablet 2  . terbinafine (LAMISIL AT) 1 % cream Apply 1 application topically 2 (two) times daily. Tinea pedis 30 g 0  . triamterene-hydrochlorothiazide (MAXZIDE) 75-50 MG tablet Take 1 tablet by mouth daily. 90 tablet 1   No facility-administered medications prior to visit.    Allergies  Allergen Reactions  . Penicillins Hives and Itching  . Verapamil     Junctional rhythm  . Influenza Vaccines Rash    Review of Systems  Constitutional: Negative for fever and malaise/fatigue.  HENT: Negative for congestion.   Eyes: Negative for blurred vision.  Respiratory: Negative for shortness of breath.   Cardiovascular: Negative for  chest pain, palpitations and leg swelling.  Gastrointestinal: Negative for nausea, abdominal pain and blood in stool.  Genitourinary: Negative for dysuria and frequency.  Musculoskeletal: Negative for falls.  Skin: Negative for rash.  Neurological: Negative for dizziness, loss of consciousness and headaches.  Endo/Heme/Allergies: Negative for environmental allergies.  Psychiatric/Behavioral: Negative for depression. The patient is not nervous/anxious.        Objective:    Physical Exam  Constitutional: He is oriented to person, place, and time. He appears well-developed and well-nourished. No distress.  HENT:  Head: Normocephalic and atraumatic.  Eyes: Conjunctivae are normal.  Neck: Neck supple. No thyromegaly present.  Cardiovascular: Normal rate, regular rhythm and normal heart sounds.   No murmur heard. Pulmonary/Chest: Effort normal and breath sounds normal. No respiratory distress. He has no wheezes.  Abdominal: Soft. Bowel sounds are normal. He exhibits no mass. There is no tenderness.  Musculoskeletal: He exhibits no edema.  Lymphadenopathy:    He has no cervical adenopathy.  Neurological: He is alert and oriented to person, place, and time.  Skin: Skin is warm and dry.  Psychiatric: He has a normal mood and affect. His behavior is normal.    BP 138/68 mmHg  Pulse 71  Temp(Src) 98.5 F (36.9 C) (Oral)  Ht 6' (1.829 m)  Wt 231 lb 2 oz (104.838 kg)  BMI 31.34 kg/m2  SpO2 96% Wt Readings from Last 3 Encounters:  07/17/15 231 lb 2 oz (104.838 kg)  03/23/15 230 lb 4 oz (104.441 kg)  12/02/14 229 lb 6.4 oz (104.055 kg)     Lab Results  Component Value Date   WBC 8.9 07/13/2015   HGB 14.1 07/13/2015   HCT 43.1 07/13/2015   PLT 198.0 07/13/2015   GLUCOSE 103* 07/13/2015   CHOL 118 07/13/2015   TRIG 146.0 07/13/2015   HDL 30.80* 07/13/2015   LDLDIRECT 70.8 03/14/2014   LDLCALC 58 07/13/2015   ALT 18 07/13/2015   AST 15 07/13/2015   NA 139 07/13/2015   K 3.8  07/13/2015   CL 109 07/13/2015   CREATININE 1.47 07/13/2015   BUN 30* 07/13/2015   CO2 21 07/13/2015   TSH 2.52 07/13/2015   PSA 1.18 10/07/2012   INR 1.21 05/23/2014   HGBA1C 7.1* 07/13/2015   MICROALBUR 1.01 07/07/2012    Lab Results  Component Value Date   TSH 2.52 07/13/2015   Lab Results  Component Value Date   WBC 8.9 07/13/2015   HGB 14.1 07/13/2015   HCT 43.1 07/13/2015   MCV 83.8 07/13/2015   PLT 198.0 07/13/2015   Lab Results  Component Value Date   NA 139 07/13/2015   K  3.8 07/13/2015   CO2 21 07/13/2015   GLUCOSE 103* 07/13/2015   BUN 30* 07/13/2015   CREATININE 1.47 07/13/2015   BILITOT 0.6 07/13/2015   ALKPHOS 55 07/13/2015   AST 15 07/13/2015   ALT 18 07/13/2015   PROT 6.7 07/13/2015   ALBUMIN 3.7 07/13/2015   CALCIUM 8.9 07/13/2015   ANIONGAP 9 05/23/2014   GFR 48.99* 07/13/2015   Lab Results  Component Value Date   CHOL 118 07/13/2015   Lab Results  Component Value Date   HDL 30.80* 07/13/2015   Lab Results  Component Value Date   LDLCALC 58 07/13/2015   Lab Results  Component Value Date   TRIG 146.0 07/13/2015   Lab Results  Component Value Date   CHOLHDL 4 07/13/2015   Lab Results  Component Value Date   HGBA1C 7.1* 07/13/2015       Assessment & Plan:   Problem List Items Addressed This Visit    Vitamin D deficiency   Relevant Orders   CBC   TSH   Lipid panel   Comprehensive metabolic panel   Hemoglobin A1c   VITAMIN D 25 Hydroxy (Vit-D Deficiency, Fractures)   Preventative health care    Patient encouraged to maintain heart healthy diet, regular exercise, adequate sleep. Consider daily probiotics. Take medications as prescribed. Given and reviewed copy of ACP documents from [employer] and encouraged to complete and return. Labs reviewed      Osteopenia    Encouraged to get adequate exercise, calcium and vitamin d intake.       Relevant Orders   CBC   TSH   Lipid panel   Comprehensive metabolic  panel   Hemoglobin A1c   VITAMIN D 25 Hydroxy (Vit-D Deficiency, Fractures)   Hyperlipidemia - Primary (Chronic)    Tolerating statin, encouraged heart healthy diet, avoid trans fats, minimize simple carbs and saturated fats. Increase exercise as tolerated      Relevant Orders   CBC   TSH   Lipid panel   Comprehensive metabolic panel   Hemoglobin A1c   VITAMIN D 25 Hydroxy (Vit-D Deficiency, Fractures)   HTN (hypertension)    Well controlled, no changes to meds. Encouraged heart healthy diet such as the DASH diet and exercise as tolerated.       Relevant Orders   CBC   TSH   Lipid panel   Comprehensive metabolic panel   Hemoglobin A1c   VITAMIN D 25 Hydroxy (Vit-D Deficiency, Fractures)   GERD (gastroesophageal reflux disease) (Chronic)    Avoid offending foods, start probiotics. Do not eat large meals in late evening and consider raising head of bed.       Diabetes mellitus with chronic kidney disease (HCC) (Chronic)    hgba1c acceptable, minimize simple carbs. Increase exercise as tolerated. Continue current meds      Diabetes mellitus type 2 in obese (HCC)    BS 114 today, can go as high 180. hgba1c acceptable, minimize simple carbs. Increase exercise as tolerated. Continue current meds      Relevant Orders   CBC   TSH   Lipid panel   Comprehensive metabolic panel   Hemoglobin A1c   VITAMIN D 25 Hydroxy (Vit-D Deficiency, Fractures)      I am having Mr. Mcwain start on Vitamin D (Ergocalciferol). I am also having him maintain his calcium carbonate, onetouch ultrasoft, cholecalciferol, terbinafine, glucose blood, glipiZIDE, JANUVIA, lisinopril, amLODipine, metFORMIN, atorvastatin, and triamterene-hydrochlorothiazide.  Meds ordered this encounter  Medications  .  Vitamin D, Ergocalciferol, (DRISDOL) 50000 units CAPS capsule    Sig: Take 1 capsule (50,000 Units total) by mouth every 7 (seven) days.    Dispense:  12 capsule    Refill:  0     [name],  MD

## 2015-07-17 NOTE — Assessment & Plan Note (Signed)
Well controlled, no changes to meds. Encouraged heart healthy diet such as the DASH diet and exercise as tolerated.  °

## 2015-07-30 NOTE — Assessment & Plan Note (Signed)
Patient encouraged to maintain heart healthy diet, regular exercise, adequate sleep. Consider daily probiotics. Take medications as prescribed. Given and reviewed copy of ACP documents from [place] [employer] State and encouraged to complete and return. Labs reviewed.  

## 2015-07-30 NOTE — Assessment & Plan Note (Signed)
Avoid offending foods, start probiotics. Do not eat large meals in late evening and consider raising head of bed.  

## 2015-07-30 NOTE — Assessment & Plan Note (Signed)
Tolerating statin, encouraged heart healthy diet, avoid trans fats, minimize simple carbs and saturated fats. Increase exercise as tolerated 

## 2015-07-30 NOTE — Assessment & Plan Note (Signed)
hgba1c acceptable, minimize simple carbs. Increase exercise as tolerated. Continue current meds 

## 2015-09-06 DIAGNOSIS — L573 Poikiloderma of Civatte: Secondary | ICD-10-CM | POA: Diagnosis not present

## 2015-09-06 DIAGNOSIS — L821 Other seborrheic keratosis: Secondary | ICD-10-CM | POA: Diagnosis not present

## 2015-09-06 DIAGNOSIS — L820 Inflamed seborrheic keratosis: Secondary | ICD-10-CM | POA: Diagnosis not present

## 2015-09-06 DIAGNOSIS — D1801 Hemangioma of skin and subcutaneous tissue: Secondary | ICD-10-CM | POA: Diagnosis not present

## 2015-09-06 DIAGNOSIS — Z85828 Personal history of other malignant neoplasm of skin: Secondary | ICD-10-CM | POA: Diagnosis not present

## 2015-09-06 DIAGNOSIS — L570 Actinic keratosis: Secondary | ICD-10-CM | POA: Diagnosis not present

## 2015-10-05 IMAGING — CT CT HEAD W/O CM
1 series · 15 of 30 positions shown, 19 images · non-contrast
Comparison: 05/19/2014

CLINICAL DATA: Patient with subdural hematoma. , followup
evaluation.

EXAM:
CT HEAD WITHOUT CONTRAST
TECHNIQUE: Contiguous axial images were obtained from the base of the skull
through the vertex without intravenous contrast.

[Series 2: head 5.0 h30s · axial · 0.44mm/px · z∈[-112,+28]mm · 15 of 32 slices shown, 19 images]
[im 2/32  brain]
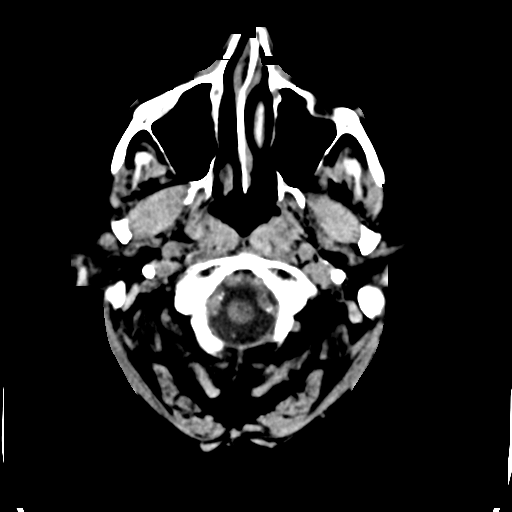
[im 2/32  bone]
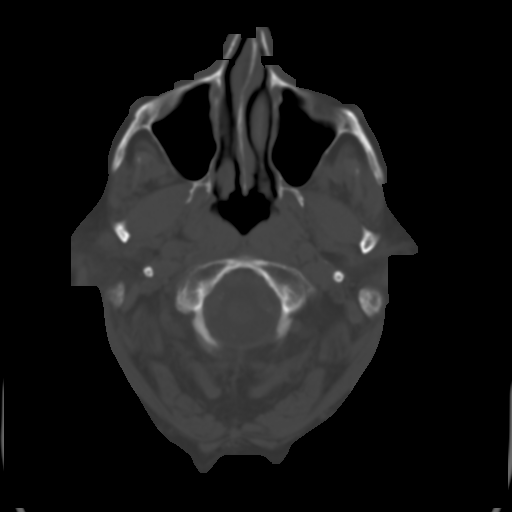
[im 4/32  brain]
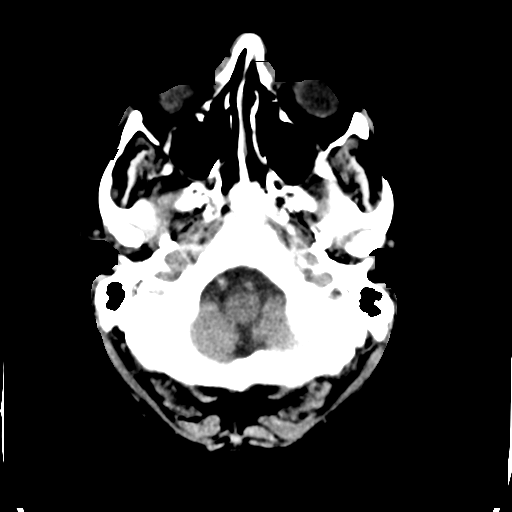
[im 6/32  brain]
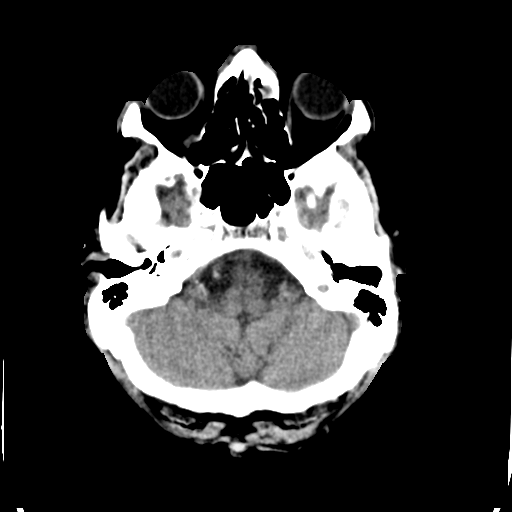
[im 8/32  brain]
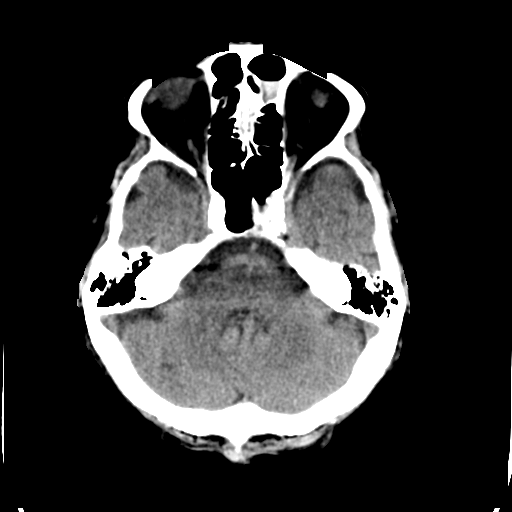
[im 10/32  brain]
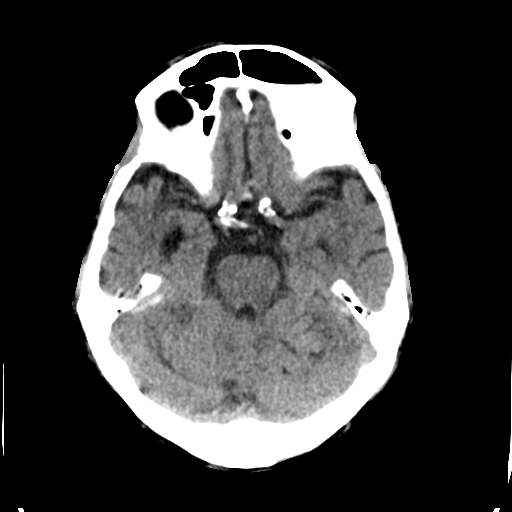
[im 10/32  bone]
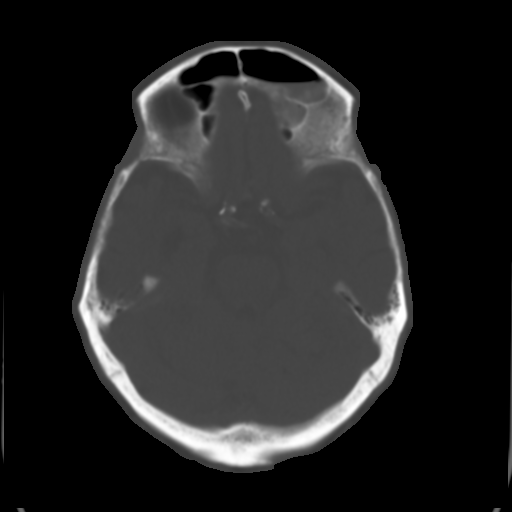
[im 12/32  brain]
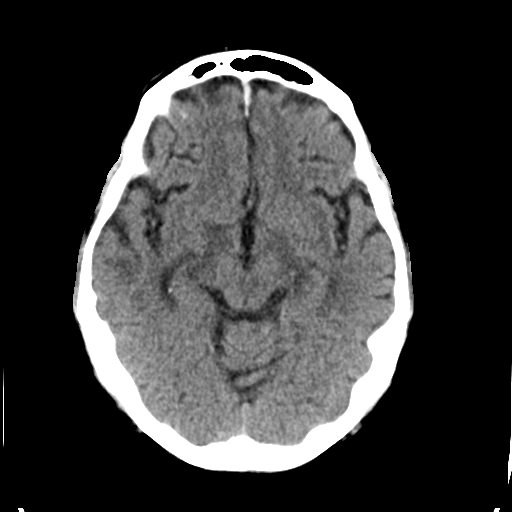
[im 14/32  brain]
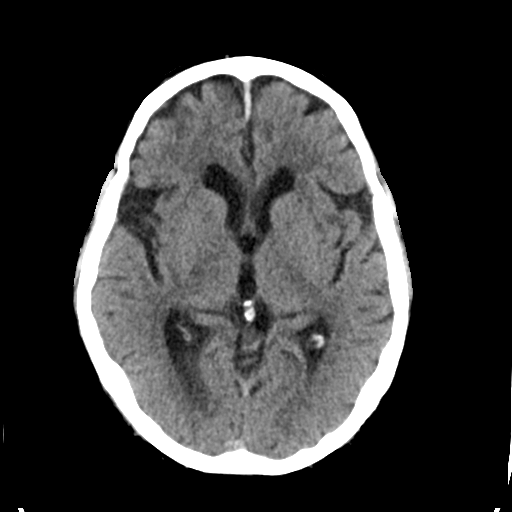
[im 17/32  brain]
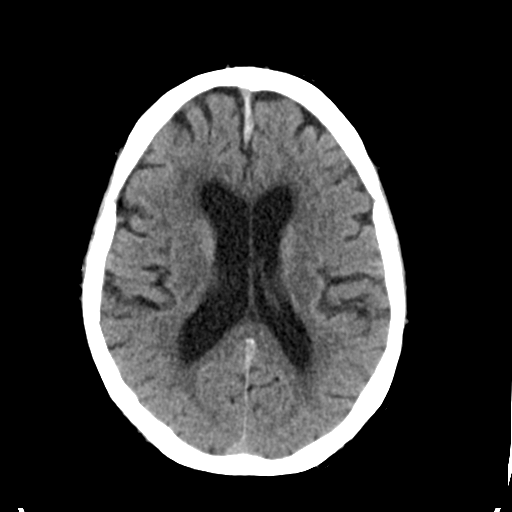
[im 18/32  brain]
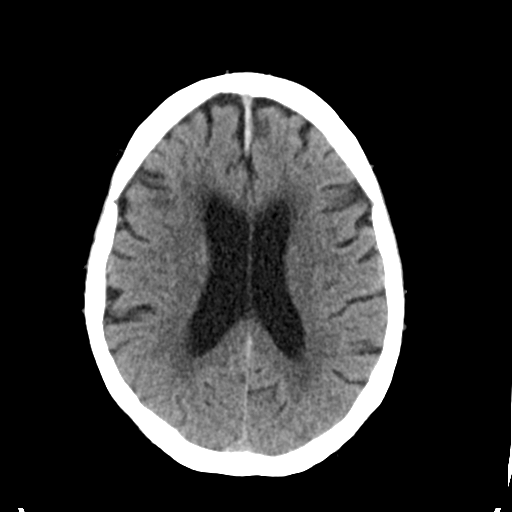
[im 18/32  bone]
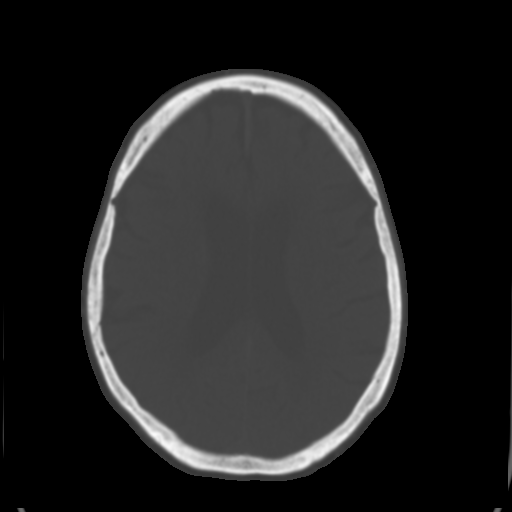
[im 20/32  brain]
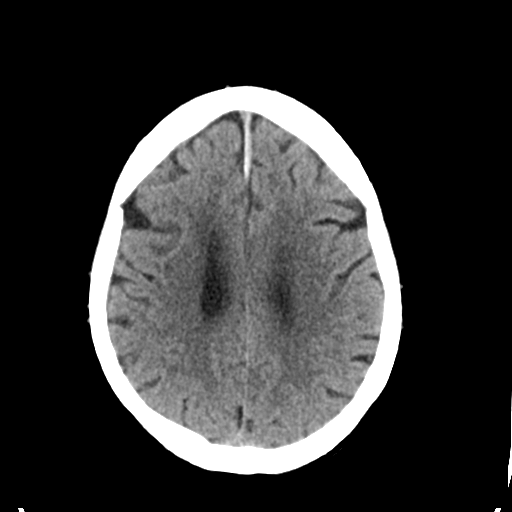
[im 22/32  brain]
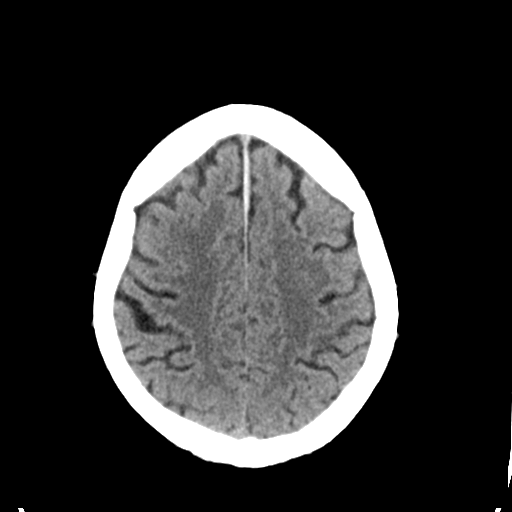
[im 24/32  brain]
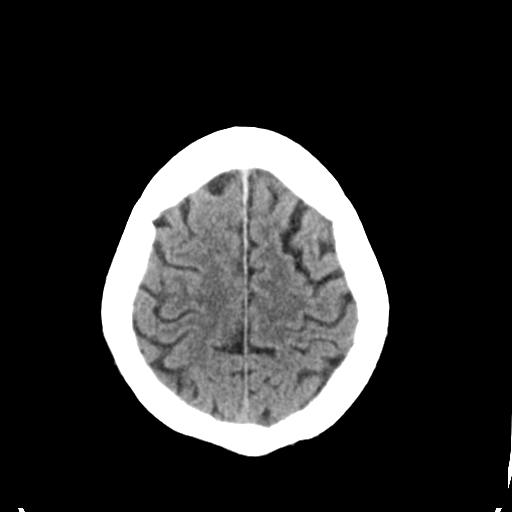
[im 26/32  brain]
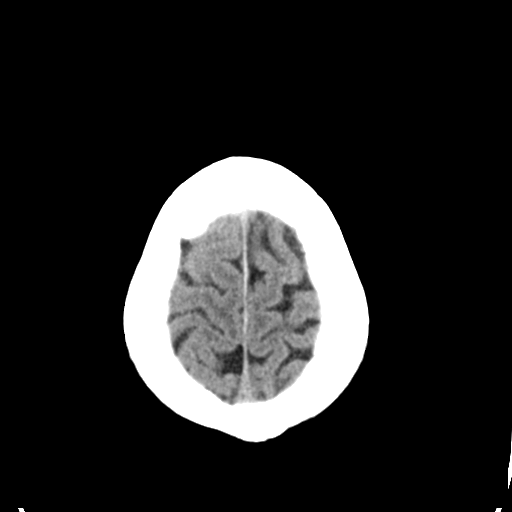
[im 26/32  bone]
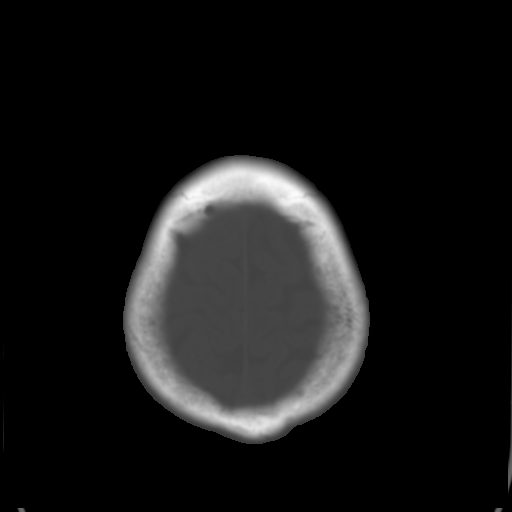
[im 28/32  brain]
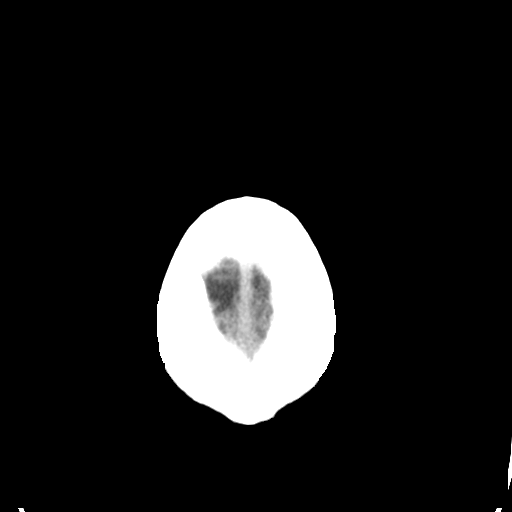
[im 30/32  brain]
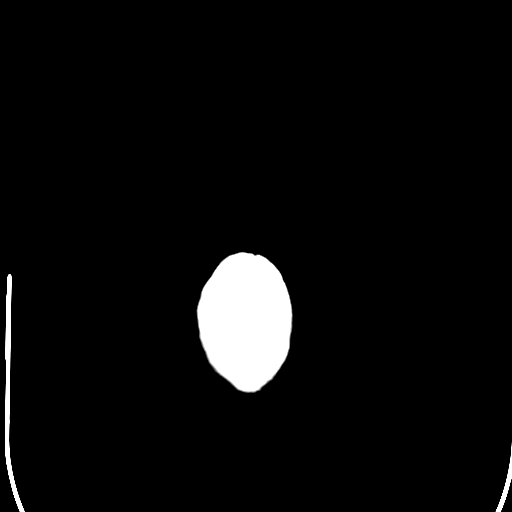

[15 of 30 positions shown; findings below may reference images not displayed]

FINDINGS: No significant interval change in size of approximate 3 mm subdural
hematoma along the left side of the anterior falx. Ventricles and
sulci are prominent, compatible with atrophy. Periventricular and
subcortical white matter hypodensity compatible with chronic small
vessel ischemic change. No evidence for significant mass effect or
acute cortically based infarct. Stable old remote infarcts within
the bilateral cerebellar hemispheres. Re- demonstrated mildly
comminuted fracture involving left aspect of the nasal bone.
Additionally there are minimally displaced fractures to the inner
and outer table of the left frontal sinus. Unchanged small amount of
blood products within the left aspect of the frontal sinus.
IMPRESSION: 1. No significant interval change in acute subdural hemorrhage along
the left side of the anterior falx, measuring 3 mm in thickness.
2. Unchanged comminuted left side nasal bone fracture.
3. Re- demonstrated fractures to the inner and outer table of the
left frontal sinus with blood products involving the left aspect of
the frontal sinus.

## 2015-10-10 ENCOUNTER — Other Ambulatory Visit: Payer: Self-pay | Admitting: Family Medicine

## 2015-10-22 ENCOUNTER — Telehealth: Payer: Self-pay | Admitting: Family Medicine

## 2015-10-23 IMAGING — CT CT HEAD W/O CM
2 series · 16 of 30 positions shown, 18 images · non-contrast
Comparison: 05/23/2014; 05/21/2014; 05/19/2014; brain MRI -
05/23/2014

CLINICAL DATA: Post fall (05/19/2014) hitting right side of head on
concrete with head bleed. Evaluate for resolution.

EXAM:
CT HEAD WITHOUT CONTRAST
TECHNIQUE: Contiguous axial images were obtained from the base of the skull
through the vertex without intravenous contrast.

[Series 2: head 4.8 h37s · axial · 0.44mm/px · z∈[-29,+91]mm · 8 of 32 slices shown, 10 images]
[im 4/32  brain]
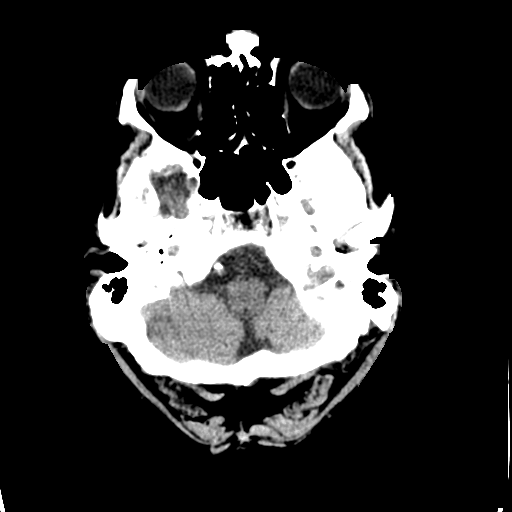
[im 4/32  bone]
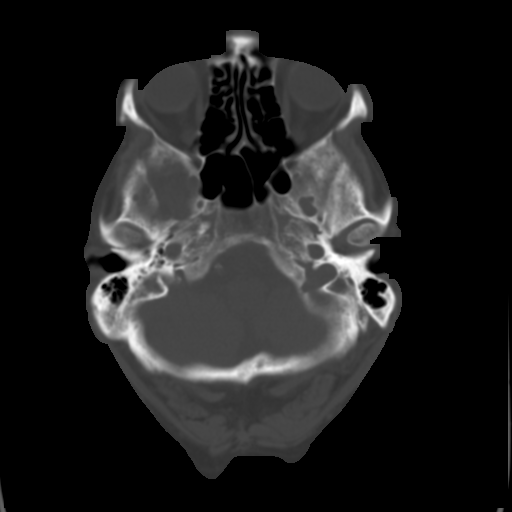
[im 7/32  brain]
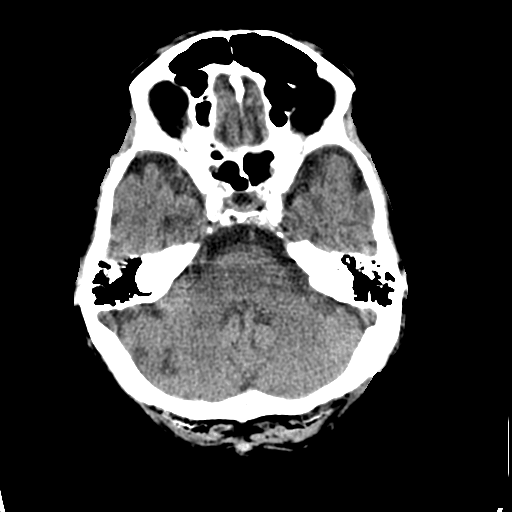
[im 11/32  brain]
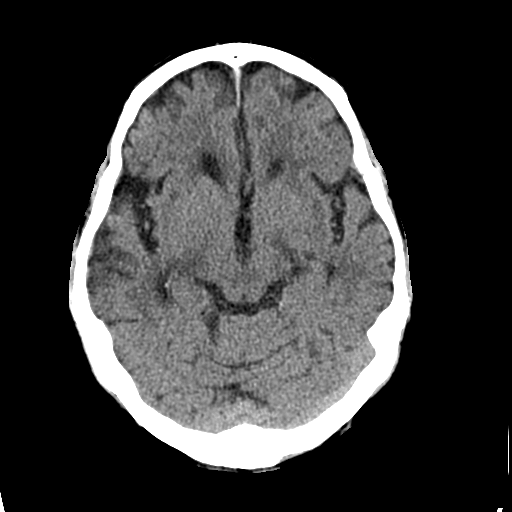
[im 14/32  brain]
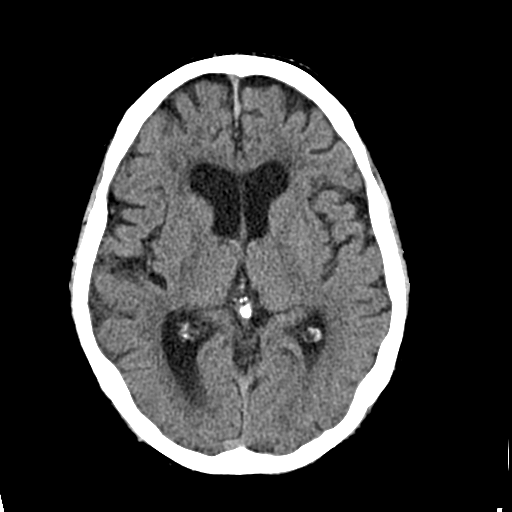
[im 18/32  brain]
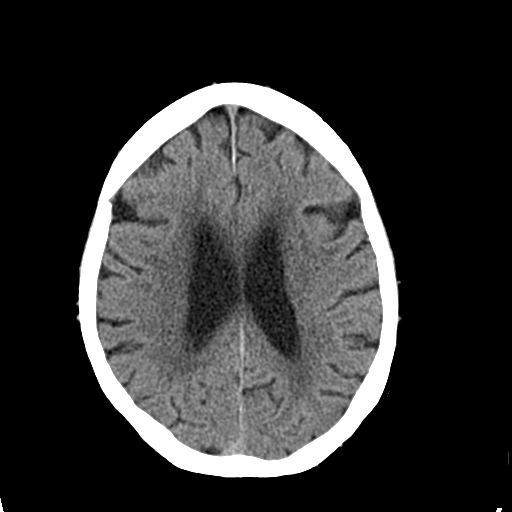
[im 18/32  bone]
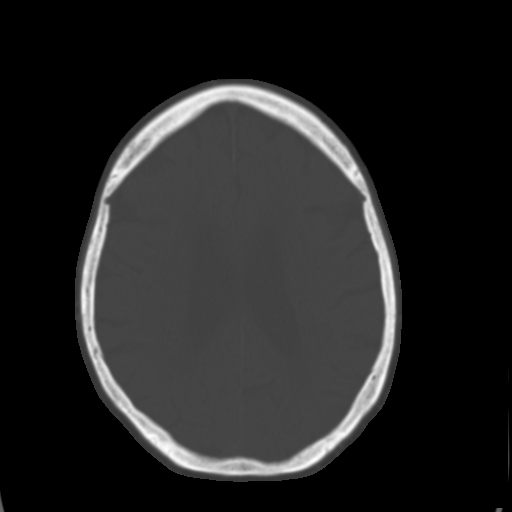
[im 21/32  brain]
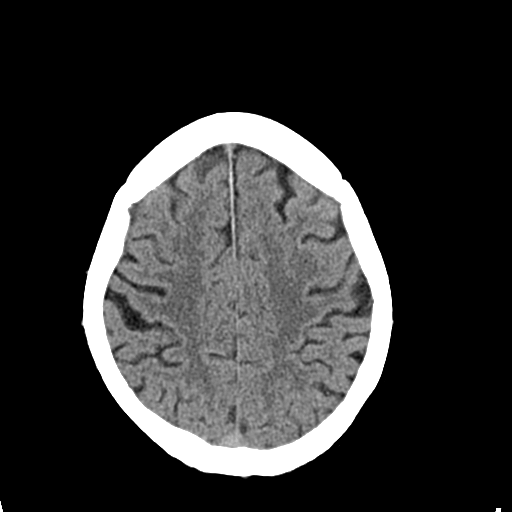
[im 25/32  brain]
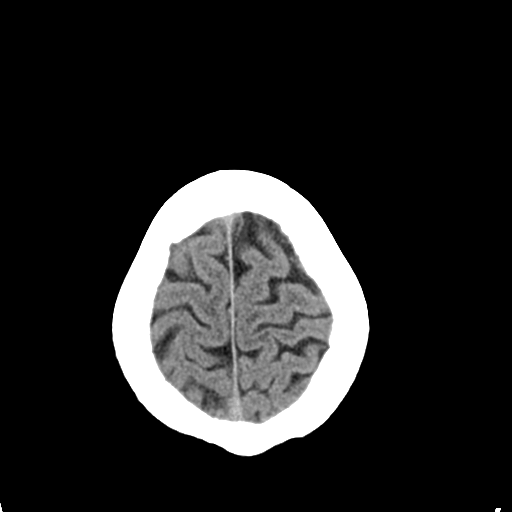
[im 28/32  brain]
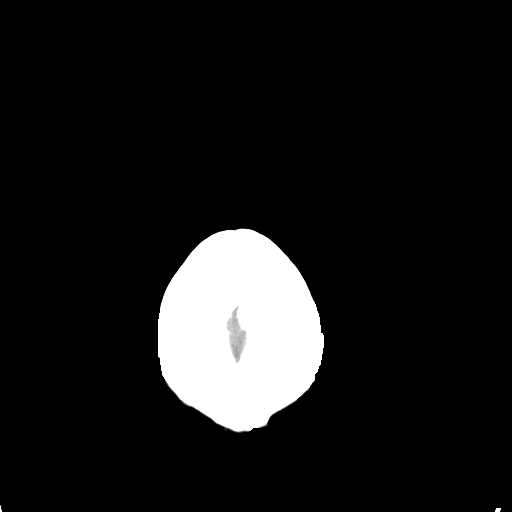

[Series 3: head 2.4 h60s bone · axial · 0.44mm/px · z∈[-31,+94]mm · 8 of 64 slices shown]
[im 7/64  bone]
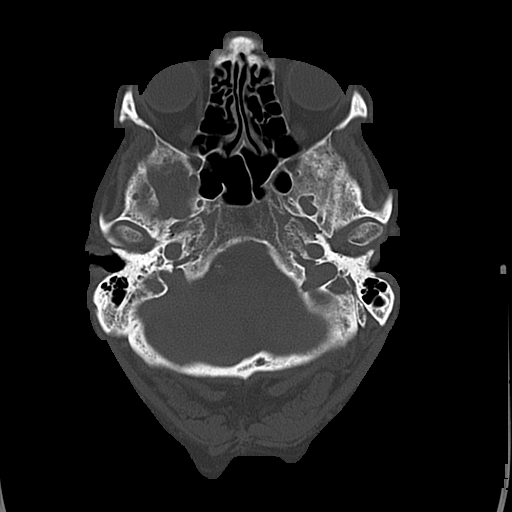
[im 14/64  bone]
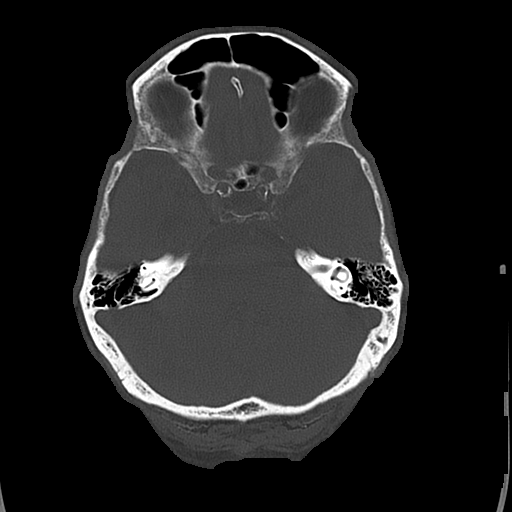
[im 20/64  bone]
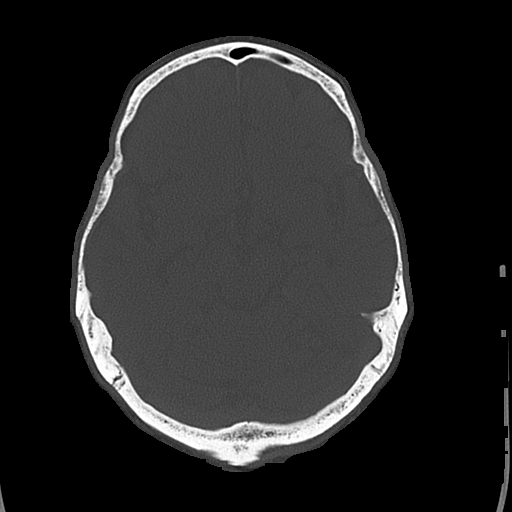
[im 27/64  bone]
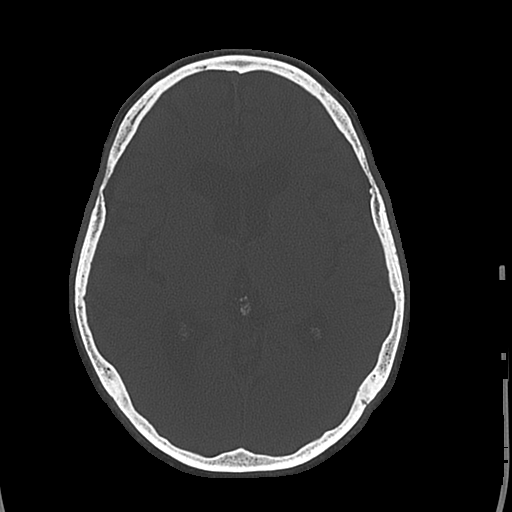
[im 37/64  bone]
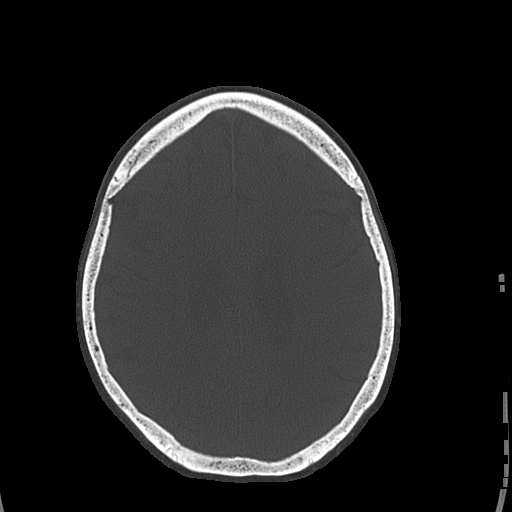
[im 44/64  bone]
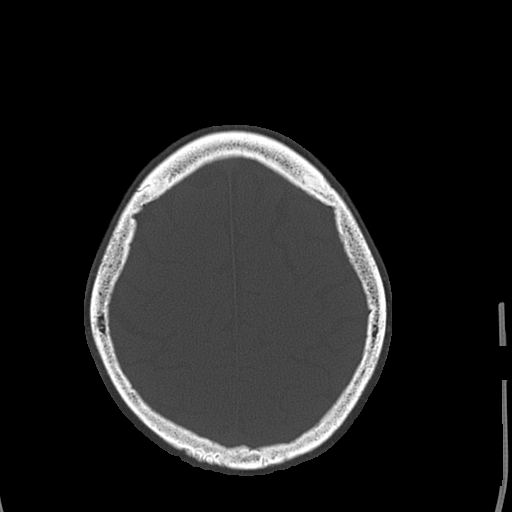
[im 50/64  bone]
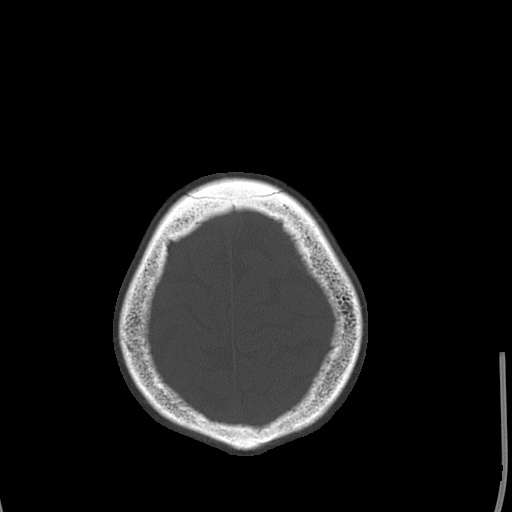
[im 57/64  bone]
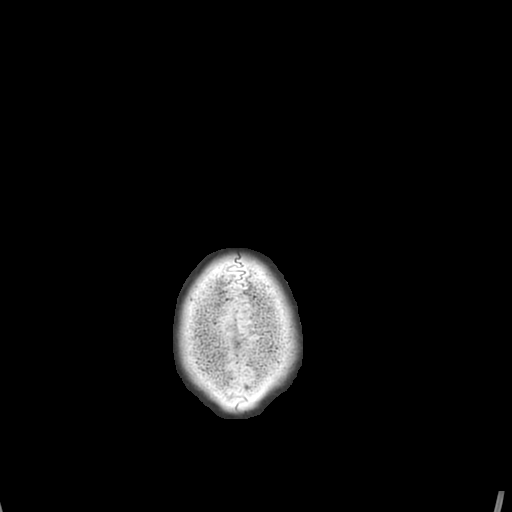

[16 of 30 positions shown; findings below may reference images not displayed]

FINDINGS: There is a very minimal (approximately 0.7 x 0.3 cm) amount of
residual subdural blood about the left side of the anterior midline
falx (image 14, series 2). No new intraparenchymal or extra-axial
hemorrhage.

Similar findings of atrophy with diffuse sulcal prominence and
prominence of the bifrontal extra-axial spaces. Scattered
periventricular hypodensities compatible microvascular ischemic
disease. The gray-white differentiation is otherwise well maintained
without CT evidence of acute large territory infarct. No
intraparenchymal or extra-axial mass. Unchanged size and
configuration of the ventricles and basilar cisterns. No midline
shift. Intracranial atherosclerosis. Limited visualization the
paranasal sinuses and mastoid air cells is normal. Regional soft
tissues appear normal. Post bilateral cataract surgery.

No displaced calvarial fracture. Known minimally displaced
left-sided nasal bone fractures are not imaged on the present
examination.
IMPRESSION: 1. No acute intracranial process with interval reduction in residual
tiny amount of subdural blood along the left side of the anterior
aspect of the midline falx without associated mass effect.
2. Similar findings of atrophy and microvascular ischemic disease.

## 2015-10-30 MED ORDER — GLUCOSE BLOOD VI STRP: ORAL_STRIP | 6 refills | Status: DC

## 2015-10-30 NOTE — Addendum Note (Signed)
Addended by: Sharon Seller B on: 10/30/2015 08:18 AM   Modules accepted: Orders

## 2015-11-07 MED ORDER — ONETOUCH ULTRASOFT LANCETS MISC: 6 refills | Status: DC

## 2015-11-07 MED ORDER — GLUCOSE BLOOD VI STRP: ORAL_STRIP | 6 refills | Status: DC

## 2015-11-07 NOTE — Telephone Encounter (Signed)
Refilled as requested  

## 2015-11-07 NOTE — Addendum Note (Signed)
Addended by: Sharon Seller B on: 11/07/2015 03:47 PM   Modules accepted: Orders

## 2015-11-07 NOTE — Telephone Encounter (Signed)
As per pharmacy prescription never received requesting 5 refills due to patient insurance. Please advise

## 2015-11-15 ENCOUNTER — Encounter: Payer: Self-pay | Admitting: Family Medicine

## 2015-11-16 ENCOUNTER — Other Ambulatory Visit: Payer: Self-pay | Admitting: Family Medicine

## 2015-11-16 DIAGNOSIS — E119 Type 2 diabetes mellitus without complications: Secondary | ICD-10-CM

## 2015-11-16 DIAGNOSIS — L989 Disorder of the skin and subcutaneous tissue, unspecified: Secondary | ICD-10-CM

## 2015-11-17 ENCOUNTER — Ambulatory Visit (INDEPENDENT_AMBULATORY_CARE_PROVIDER_SITE_OTHER): Payer: PPO | Admitting: Podiatry

## 2015-11-17 ENCOUNTER — Encounter: Payer: Self-pay | Admitting: Podiatry

## 2015-11-17 ENCOUNTER — Ambulatory Visit: Payer: PPO | Admitting: Medical

## 2015-11-17 ENCOUNTER — Ambulatory Visit (INDEPENDENT_AMBULATORY_CARE_PROVIDER_SITE_OTHER): Payer: PPO

## 2015-11-17 VITALS — BP 176/86 | HR 71 | Resp 16 | Ht 72.0 in | Wt 228.0 lb

## 2015-11-17 DIAGNOSIS — L02611 Cutaneous abscess of right foot: Secondary | ICD-10-CM | POA: Diagnosis not present

## 2015-11-17 DIAGNOSIS — E119 Type 2 diabetes mellitus without complications: Secondary | ICD-10-CM

## 2015-11-17 DIAGNOSIS — M79674 Pain in right toe(s): Secondary | ICD-10-CM

## 2015-11-17 DIAGNOSIS — M79675 Pain in left toe(s): Secondary | ICD-10-CM

## 2015-11-17 DIAGNOSIS — L03031 Cellulitis of right toe: Secondary | ICD-10-CM | POA: Diagnosis not present

## 2015-11-17 DIAGNOSIS — M79605 Pain in left leg: Secondary | ICD-10-CM

## 2015-11-17 DIAGNOSIS — M79671 Pain in right foot: Secondary | ICD-10-CM

## 2015-11-17 DIAGNOSIS — M79604 Pain in right leg: Secondary | ICD-10-CM

## 2015-11-17 DIAGNOSIS — B351 Tinea unguium: Secondary | ICD-10-CM | POA: Diagnosis not present

## 2015-11-17 NOTE — Progress Notes (Signed)
Subjective:     Patient ID: CORNEAL ELICK, male   DOB: [birth year], 82 y.o.   MRN: [id number]  HPI patient presents with a blister of the right foot with long-term history of diabetes and states it's been present for around a week   Review of Systems  All other systems reviewed and are negative.      Objective:   Physical Exam  Constitutional: He is oriented to person, place, and time.  Cardiovascular: Intact distal pulses.   Musculoskeletal: Normal range of motion.  Neurological: He is oriented to person, place, and time.  Skin: Skin is warm and dry.  Nursing note and vitals reviewed.  neurovascular status mildly diminished with patient noted to have a blistered area second interspace right localized with proximal edema erythema not noticed. Patient has good digital perfusion and is well oriented 3 with no other pathology currently noted     Assessment:     Localized abscess of the right second interspace with possibility of infection localized    Plan:     H&P x-ray reviewed and using sterile instrumentation after appropriate anesthesia the abscess was resected. It was noted to be stair also I did not do pathology and I advised on soaks and applied sterile dressing and he will be seen back as needed  X-ray report indicates that there is no signs of bone infection with patient having moderate arthritis and no look of stress fracture

## 2015-11-17 NOTE — Progress Notes (Signed)
   Subjective:    Patient ID: Vincent Allen, male    DOB: 09-04-1933, 82 y.o.   MRN: [id number]  HPI Chief Complaint  Patient presents with  . Blister    Right foot; interdigital 2nd & 3rd toe; pt stated, "Noticed blister on Tuesday, November 14, 2015"   Pt Diabetic Type 2; Sugar=124 this am; A1C=7.1  Review of Systems  HENT: Positive for hearing loss.   All other systems reviewed and are negative.      Objective:   Physical Exam        Assessment & Plan:

## 2015-12-22 ENCOUNTER — Other Ambulatory Visit: Payer: Self-pay | Admitting: Family Medicine

## 2016-01-08 ENCOUNTER — Other Ambulatory Visit: Payer: Self-pay | Admitting: Family Medicine

## 2016-01-17 ENCOUNTER — Other Ambulatory Visit (INDEPENDENT_AMBULATORY_CARE_PROVIDER_SITE_OTHER): Payer: PPO

## 2016-01-17 DIAGNOSIS — M8580 Other specified disorders of bone density and structure, unspecified site: Secondary | ICD-10-CM | POA: Diagnosis not present

## 2016-01-17 DIAGNOSIS — E559 Vitamin D deficiency, unspecified: Secondary | ICD-10-CM

## 2016-01-17 DIAGNOSIS — E1169 Type 2 diabetes mellitus with other specified complication: Secondary | ICD-10-CM

## 2016-01-17 DIAGNOSIS — I10 Essential (primary) hypertension: Secondary | ICD-10-CM

## 2016-01-17 DIAGNOSIS — E669 Obesity, unspecified: Secondary | ICD-10-CM

## 2016-01-17 DIAGNOSIS — E785 Hyperlipidemia, unspecified: Secondary | ICD-10-CM

## 2016-01-17 LAB — COMPREHENSIVE METABOLIC PANEL
ALK PHOS: 56 U/L (ref 39–117)
ALT: 17 U/L (ref 0–53)
AST: 14 U/L (ref 0–37)
Albumin: 3.7 g/dL (ref 3.5–5.2)
BUN: 30 mg/dL — AB (ref 6–23)
CO2: 23 mEq/L (ref 19–32)
Calcium: 9.2 mg/dL (ref 8.4–10.5)
Chloride: 107 mEq/L (ref 96–112)
Creatinine, Ser: 1.5 mg/dL (ref 0.40–1.50)
GFR: 47.8 mL/min — ABNORMAL LOW (ref >60.00)
GLUCOSE: 93 mg/dL (ref 70–99)
Potassium: 4.2 mEq/L (ref 3.5–5.1)
Sodium: 139 mEq/L (ref 135–145)
TOTAL PROTEIN: 6.6 g/dL (ref 6.0–8.3)
Total Bilirubin: 0.6 mg/dL (ref 0.2–1.2)

## 2016-01-17 LAB — CBC
HEMATOCRIT: 44 % (ref 39.0–52.0)
Hemoglobin: 14.5 g/dL (ref 13.0–17.0)
MCHC: 33 g/dL (ref 30.0–36.0)
MCV: 84.3 fl (ref 78.0–100.0)
Platelets: 185 10*3/uL (ref 150.0–400.0)
RBC: 5.22 Mil/uL (ref 4.22–5.81)
RDW: 13.8 % (ref 11.5–15.5)
WBC: 8.2 10*3/uL (ref 4.0–10.5)

## 2016-01-17 LAB — LIPID PANEL
CHOLESTEROL: 108 mg/dL (ref 0–200)
HDL: 36.7 mg/dL — AB (ref >39.00)
LDL Cholesterol: 48 mg/dL (ref 0–99)
NONHDL: 71.41
Total CHOL/HDL Ratio: 3
Triglycerides: 118 mg/dL (ref 0.0–149.0)
VLDL: 23.6 mg/dL (ref 0.0–40.0)

## 2016-01-17 LAB — TSH: TSH: 2.48 u[IU]/mL (ref 0.35–4.50)

## 2016-01-17 LAB — HEMOGLOBIN A1C: Hgb A1c MFr Bld: 6.7 % — ABNORMAL HIGH (ref 4.6–6.5)

## 2016-01-17 LAB — VITAMIN D 25 HYDROXY (VIT D DEFICIENCY, FRACTURES): VITD: 34.85 ng/mL (ref 30.00–100.00)

## 2016-01-23 ENCOUNTER — Ambulatory Visit (HOSPITAL_BASED_OUTPATIENT_CLINIC_OR_DEPARTMENT_OTHER)
Admission: RE | Admit: 2016-01-23 | Discharge: 2016-01-23 | Disposition: A | Discharge status: Home / Self Care | Payer: PPO | Source: Ambulatory Visit | Attending: Family Medicine | Admitting: Family Medicine

## 2016-01-23 ENCOUNTER — Ambulatory Visit (INDEPENDENT_AMBULATORY_CARE_PROVIDER_SITE_OTHER): Payer: PPO | Admitting: Family Medicine

## 2016-01-23 ENCOUNTER — Encounter: Payer: Self-pay | Admitting: Family Medicine

## 2016-01-23 VITALS — BP 132/70 | HR 72 | Temp 98.6°F | Ht 73.0 in | Wt 227.0 lb

## 2016-01-23 DIAGNOSIS — E785 Hyperlipidemia, unspecified: Secondary | ICD-10-CM | POA: Diagnosis not present

## 2016-01-23 DIAGNOSIS — I10 Essential (primary) hypertension: Secondary | ICD-10-CM

## 2016-01-23 DIAGNOSIS — M40292 Other kyphosis, cervical region: Secondary | ICD-10-CM | POA: Insufficient documentation

## 2016-01-23 DIAGNOSIS — M1288 Other specific arthropathies, not elsewhere classified, other specified site: Secondary | ICD-10-CM | POA: Insufficient documentation

## 2016-01-23 DIAGNOSIS — M8580 Other specified disorders of bone density and structure, unspecified site: Secondary | ICD-10-CM

## 2016-01-23 DIAGNOSIS — M4802 Spinal stenosis, cervical region: Secondary | ICD-10-CM | POA: Diagnosis not present

## 2016-01-23 DIAGNOSIS — E559 Vitamin D deficiency, unspecified: Secondary | ICD-10-CM | POA: Diagnosis not present

## 2016-01-23 DIAGNOSIS — M50322 Other cervical disc degeneration at C5-C6 level: Secondary | ICD-10-CM | POA: Diagnosis not present

## 2016-01-23 DIAGNOSIS — R202 Paresthesia of skin: Secondary | ICD-10-CM

## 2016-01-23 DIAGNOSIS — M5031 Other cervical disc degeneration,  high cervical region: Secondary | ICD-10-CM | POA: Diagnosis not present

## 2016-01-23 DIAGNOSIS — M50321 Other cervical disc degeneration at C4-C5 level: Secondary | ICD-10-CM | POA: Diagnosis not present

## 2016-01-23 MED ORDER — CHOLECALCIFEROL 50 MCG (2000 UT) PO CAPS: 1.0000 | ORAL_CAPSULE | Freq: Every day | ORAL | Status: DC

## 2016-01-23 NOTE — Assessment & Plan Note (Signed)
Labs reveal deficiency Vitamin D 2000 IU daily.

## 2016-01-23 NOTE — Assessment & Plan Note (Signed)
Well controlled, no changes to meds. Encouraged heart healthy diet such as the DASH diet and exercise as tolerated.  °

## 2016-01-23 NOTE — Assessment & Plan Note (Signed)
Encouraged to get adequate exercise, calcium and vitamin d intake 

## 2016-01-23 NOTE — Patient Instructions (Signed)

## 2016-01-23 NOTE — Assessment & Plan Note (Signed)
Tolerating statin, encouraged heart healthy diet, avoid trans fats, minimize simple carbs and saturated fats. Increase exercise as tolerated 

## 2016-01-23 NOTE — Progress Notes (Signed)
Patient ID: Vincent Allen, male   DOB: Feb 16, 1934, 82 y.o.   MRN: [id number]   Subjective:    Patient ID: Vincent Allen, male    DOB: 05/04/1933, 82 y.o.   MRN: [id number]  Chief Complaint  Patient presents with  . Follow-up    HPI Patient is in today for follow up. He feels well but he does note a recent episode of increased anxiety and derpession . No sucidial ideation. Notes mild side effets with cousins.   Past Medical History:  Diagnosis Date  . Cataract   . Chronic kidney disease stage III (GFR 30-59 ml/min) 05/03/2012  . Colon polyps   . Diabetes mellitus   . GERD (gastroesophageal reflux disease)   . HTN (hypertension) 01/13/2013  . Hyperlipidemia   . Hypertension   . Irregular heartbeat   . Medicare annual wellness visit, subsequent 01/17/2013  . Preventative health care 01/17/2013  . Preventative health care 07/17/2015  . Vitamin D deficiency 07/17/2015    Past Surgical History:  Procedure Laterality Date  . ANKLE ARTHROPLASTY    . CATARACT EXTRACTION  2010, 2014  . CATARACT EXTRACTION  02/28/14  . CHOLECYSTECTOMY    . CHOLECYSTECTOMY N/A 05/03/2012   Procedure: LAPAROSCOPIC CHOLECYSTECTOMY WITH INTRAOPERATIVE CHOLANGIOGRAM;  Surgeon: [name], MD;  Location: [facility];  Service: General;  Laterality: N/A;  . ROTATOR CUFF REPAIR    . TONSILLECTOMY      Family History  Problem Relation Age of Onset  . Cancer Mother     colon, breast, pancreas, skin cancer  . Heart disease Father     heart valve replaced  . Stroke Father   . Diabetes Father   . Cancer Paternal Grandmother     colon  . Obesity Son   . Heart disease Son     bradycardia    Social History   Social History  . Marital status: Divorced    Spouse name: N/A  . Number of children: N/A  . Years of education: N/A   Occupational History  . Not on file.   Social History Main Topics  . Smoking status: Never Smoker  . Smokeless tobacco: Never Used  . Alcohol use No  . Drug use: No  .  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.   Other Topics Concern  . Not on file   Social History Narrative  . No narrative on file    Outpatient Medications Prior to Visit  Medication Sig Dispense Refill  . amLODipine (NORVASC) 5 MG tablet TAKE 1 TABLET BY MOUTH EVERY DAY 90 tablet 1  . atorvastatin (LIPITOR) 20 MG tablet TAKE 1 TABLET BY MOUTH DAILY 90 tablet 3  . Calcium 600 MG tablet Take 1 tablet (600 mg total) by mouth 2 (two) times daily. 60 tablet 0  . glipiZIDE (GLUCOTROL) 10 MG tablet TAKE 2 TABLETS BY MOUTH TWICE DAILY BEFORE MEALS 360 tablet 1  . glucose blood (ONE TOUCH ULTRA TEST) test strip Use as twice daily to check blood sugar.  DX E11.9 100 each 6  . JANUVIA 100 MG tablet TAKE 1/2 TO 1 TABLET DAILY 30 tablet 5  . Lancets (ONETOUCH ULTRASOFT) lancets Use as instructed to check blood sugar twice a day as needed Dx Code E11.9 100 each 6  . lisinopril (PRINIVIL,ZESTRIL) 5 MG tablet TAKE 1 TABLET BY MOUTH EVERY DAY 90 tablet  1  . metFORMIN (GLUCOPHAGE-XR) 500 MG 24 hr tablet TAKE 1 TABLET BY MOUTH TWICE A DAY 180 tablet 2  . terbinafine (LAMISIL AT) 1 % cream Apply 1 application topically 2 (two) times daily. Tinea pedis 30 g 0  . triamterene-hydrochlorothiazide (MAXZIDE) 75-50 MG tablet TAKE 1 TABLET BY MOUTH DAILY. 90 tablet 1  . cholecalciferol (VITAMIN D) 400 UNITS TABS Take 400 Units by mouth 2 (two) times daily.    . Vitamin D, Ergocalciferol, (DRISDOL) 50000 units CAPS capsule Take 1 capsule (50,000 Units total) by mouth every 7 (seven) days. 12 capsule 0   No facility-administered medications prior to visit.     Allergies  Allergen Reactions  . Penicillins Hives and Itching  . Verapamil     Junctional rhythm  . Influenza Vaccines Rash    Review of Systems  Constitutional: Negative for fever and malaise/fatigue.  HENT: Negative for congestion.   Eyes:  Negative for blurred vision.  Respiratory: Negative for shortness of breath.   Cardiovascular: Negative for chest pain, palpitations and leg swelling.  Gastrointestinal: Positive for diarrhea. Negative for abdominal pain, blood in stool and nausea.       Loose stool but only once a day  Genitourinary: Negative for dysuria and frequency.  Musculoskeletal: Negative for falls.  Skin: Negative for rash.  Neurological: Negative for dizziness, loss of consciousness and headaches.  Endo/Heme/Allergies: Negative for environmental allergies.  Psychiatric/Behavioral: Negative for depression. The patient is not nervous/anxious.        Objective:    Physical Exam  Constitutional: He is oriented to person, place, and time. He appears well-developed and well-nourished. No distress.  HENT:  Head: Normocephalic and atraumatic.  Nose: Nose normal.  Eyes: Right eye exhibits no discharge. Left eye exhibits no discharge.  Neck: Normal range of motion. Neck supple.  Cardiovascular: Normal rate and regular rhythm.   No murmur heard. Pulmonary/Chest: Effort normal and breath sounds normal.  Abdominal: Soft. Bowel sounds are normal. There is no tenderness.  Musculoskeletal: He exhibits no edema.  Neurological: He is alert and oriented to person, place, and time.  Skin: Skin is warm and dry.  Psychiatric: He has a normal mood and affect.  Nursing note and vitals reviewed.   BP 132/70 (BP Location: Left Arm, Patient Position: Sitting, Cuff Size: Large)   Pulse 72   Temp 98.6 F (37 C) (Oral)   Ht 6\' 1"  (1.854 m)   Wt 227 lb (103 kg)   SpO2 96%   BMI 29.95 kg/m  Wt Readings from Last 3 Encounters:  01/23/16 227 lb (103 kg)  11/17/15 228 lb (103.4 kg)  07/17/15 231 lb 2 oz (104.8 kg)     Lab Results  Component Value Date   WBC 8.2 01/17/2016   HGB 14.5 01/17/2016   HCT 44.0 01/17/2016   PLT 185.0 01/17/2016   GLUCOSE 93 01/17/2016   CHOL 108 01/17/2016   TRIG 118.0 01/17/2016   HDL  36.70 (L) 01/17/2016   LDLDIRECT 70.8 03/14/2014   LDLCALC 48 01/17/2016   ALT 17 01/17/2016   AST 14 01/17/2016   NA 139 01/17/2016   K 4.2 01/17/2016   CL 107 01/17/2016   CREATININE 1.50 01/17/2016   BUN 30 (H) 01/17/2016   CO2 23 01/17/2016   TSH 2.48 01/17/2016   PSA 1.18 10/07/2012   INR 1.21 05/23/2014   HGBA1C 6.7 (H) 01/17/2016   MICROALBUR 1.01 07/07/2012    Lab Results  Component Value Date   TSH  2.48 01/17/2016   Lab Results  Component Value Date   WBC 8.2 01/17/2016   HGB 14.5 01/17/2016   HCT 44.0 01/17/2016   MCV 84.3 01/17/2016   PLT 185.0 01/17/2016   Lab Results  Component Value Date   NA 139 01/17/2016   K 4.2 01/17/2016   CO2 23 01/17/2016   GLUCOSE 93 01/17/2016   BUN 30 (H) 01/17/2016   CREATININE 1.50 01/17/2016   BILITOT 0.6 01/17/2016   ALKPHOS 56 01/17/2016   AST 14 01/17/2016   ALT 17 01/17/2016   PROT 6.6 01/17/2016   ALBUMIN 3.7 01/17/2016   CALCIUM 9.2 01/17/2016   ANIONGAP 9 05/23/2014   GFR 47.80 (L) 01/17/2016   Lab Results  Component Value Date   CHOL 108 01/17/2016   Lab Results  Component Value Date   HDL 36.70 (L) 01/17/2016   Lab Results  Component Value Date   LDLCALC 48 01/17/2016   Lab Results  Component Value Date   TRIG 118.0 01/17/2016   Lab Results  Component Value Date   CHOLHDL 3 01/17/2016   Lab Results  Component Value Date   HGBA1C 6.7 (H) 01/17/2016       Assessment & Plan:   Problem List Items Addressed This Visit    Hyperlipidemia (Chronic)    Tolerating statin, encouraged heart healthy diet, avoid trans fats, minimize simple carbs and saturated fats. Increase exercise as tolerated      Relevant Orders   Lipid panel   Osteopenia    Encouraged to get adequate exercise, calcium and vitamin d intake      HTN (hypertension)    Well controlled, no changes to meds. Encouraged heart healthy diet such as the DASH diet and exercise as tolerated.       Relevant Orders   CBC   TSH    Comprehensive metabolic panel   Vitamin D deficiency    Labs reveal deficiency Vitamin D 2000 IU daily.       Relevant Orders   VITAMIN D 25 Hydroxy (Vit-D Deficiency, Fractures)    Other Visit Diagnoses    Paresthesias    -  Primary   Relevant Orders   DG Cervical Spine Complete      I have discontinued Mr. Spiering's cholecalciferol and Vitamin D (Ergocalciferol). I am also having him start on Cholecalciferol. Additionally, I am having him maintain his calcium carbonate, terbinafine, metFORMIN, atorvastatin, glipiZIDE, glucose blood, onetouch ultrasoft, JANUVIA, lisinopril, amLODipine, and triamterene-hydrochlorothiazide.  Meds ordered this encounter  Medications  . Cholecalciferol 2000 units CAPS    Sig: Take 1 capsule (2,000 Units total) by mouth daily.    Dispense:  30 each     [name], MD

## 2016-01-23 NOTE — Progress Notes (Signed)
Pre visit review using our clinic review tool, if applicable. No additional management support is needed unless otherwise documented below in the visit note. 

## 2016-01-24 ENCOUNTER — Other Ambulatory Visit: Payer: Self-pay | Admitting: Family Medicine

## 2016-01-25 ENCOUNTER — Other Ambulatory Visit: Payer: Self-pay | Admitting: Family Medicine

## 2016-01-25 DIAGNOSIS — M542 Cervicalgia: Secondary | ICD-10-CM

## 2016-02-01 ENCOUNTER — Ambulatory Visit (HOSPITAL_BASED_OUTPATIENT_CLINIC_OR_DEPARTMENT_OTHER)
Admission: RE | Admit: 2016-02-01 | Discharge: 2016-02-01 | Disposition: A | Discharge status: Home / Self Care | Payer: PPO | Source: Ambulatory Visit | Attending: Family Medicine | Admitting: Family Medicine

## 2016-02-01 DIAGNOSIS — M4312 Spondylolisthesis, cervical region: Secondary | ICD-10-CM | POA: Insufficient documentation

## 2016-02-01 DIAGNOSIS — M4802 Spinal stenosis, cervical region: Secondary | ICD-10-CM | POA: Insufficient documentation

## 2016-02-01 DIAGNOSIS — M542 Cervicalgia: Secondary | ICD-10-CM | POA: Diagnosis not present

## 2016-02-01 DIAGNOSIS — M50223 Other cervical disc displacement at C6-C7 level: Secondary | ICD-10-CM | POA: Diagnosis not present

## 2016-02-02 ENCOUNTER — Other Ambulatory Visit: Payer: Self-pay | Admitting: Family Medicine

## 2016-02-02 DIAGNOSIS — M542 Cervicalgia: Secondary | ICD-10-CM

## 2016-02-15 ENCOUNTER — Encounter: Payer: Self-pay | Admitting: Family Medicine

## 2016-02-26 ENCOUNTER — Ambulatory Visit (INDEPENDENT_AMBULATORY_CARE_PROVIDER_SITE_OTHER): Payer: PPO | Admitting: Podiatry

## 2016-02-26 DIAGNOSIS — M79605 Pain in left leg: Secondary | ICD-10-CM

## 2016-02-26 DIAGNOSIS — B351 Tinea unguium: Secondary | ICD-10-CM | POA: Diagnosis not present

## 2016-02-26 DIAGNOSIS — E119 Type 2 diabetes mellitus without complications: Secondary | ICD-10-CM | POA: Diagnosis not present

## 2016-02-26 DIAGNOSIS — M779 Enthesopathy, unspecified: Secondary | ICD-10-CM | POA: Diagnosis not present

## 2016-02-26 DIAGNOSIS — M79604 Pain in right leg: Secondary | ICD-10-CM | POA: Diagnosis not present

## 2016-02-26 MED ORDER — TRIAMCINOLONE ACETONIDE 10 MG/ML IJ SUSP
10.0000 mg | Freq: Once | INTRAMUSCULAR | Status: AC
  Administered 2016-02-26: 10 mg

## 2016-02-26 NOTE — Progress Notes (Signed)
Subjective:     Patient ID: Vincent Allen, male   DOB: 1934/03/11, 82 y.o.   MRN: [id number]  HPI patient presents stating I have nail disease 1-5 both feet that are bothersome and also I'm developing pain in the outside of my left foot which is becoming increasingly tender   Review of Systems     Objective:   Physical Exam Neurovascular status intact muscle strength adequate with tendinitis lateral side left foot with inflammation of the peroneal complex and also noted to have nail disease with thickness yellow brittle debris 1-5 both feet    Assessment:     Mycotic nail infection 1-5 both feet with tendinitis lateral left foot    Plan:     Careful injection lateral left with 3 mg Kenalog 5 mg Xylocaine and debrided nailbeds 1-5 both feet with no iatrogenic bleeding noted

## 2016-03-04 DIAGNOSIS — M4722 Other spondylosis with radiculopathy, cervical region: Secondary | ICD-10-CM | POA: Insufficient documentation

## 2016-03-04 DIAGNOSIS — I10 Essential (primary) hypertension: Secondary | ICD-10-CM | POA: Diagnosis not present

## 2016-03-04 DIAGNOSIS — Z6830 Body mass index (BMI) 30.0-30.9, adult: Secondary | ICD-10-CM | POA: Diagnosis not present

## 2016-03-13 DIAGNOSIS — L570 Actinic keratosis: Secondary | ICD-10-CM | POA: Diagnosis not present

## 2016-03-13 DIAGNOSIS — L821 Other seborrheic keratosis: Secondary | ICD-10-CM | POA: Diagnosis not present

## 2016-03-13 DIAGNOSIS — D485 Neoplasm of uncertain behavior of skin: Secondary | ICD-10-CM | POA: Diagnosis not present

## 2016-03-13 DIAGNOSIS — Z85828 Personal history of other malignant neoplasm of skin: Secondary | ICD-10-CM | POA: Diagnosis not present

## 2016-03-13 DIAGNOSIS — D1801 Hemangioma of skin and subcutaneous tissue: Secondary | ICD-10-CM | POA: Diagnosis not present

## 2016-03-13 DIAGNOSIS — L739 Follicular disorder, unspecified: Secondary | ICD-10-CM | POA: Diagnosis not present

## 2016-04-01 DIAGNOSIS — I10 Essential (primary) hypertension: Secondary | ICD-10-CM | POA: Diagnosis not present

## 2016-04-01 DIAGNOSIS — M4722 Other spondylosis with radiculopathy, cervical region: Secondary | ICD-10-CM | POA: Diagnosis not present

## 2016-04-01 DIAGNOSIS — Z6831 Body mass index (BMI) 31.0-31.9, adult: Secondary | ICD-10-CM | POA: Diagnosis not present

## 2016-04-08 ENCOUNTER — Other Ambulatory Visit: Payer: Self-pay | Admitting: Family Medicine

## 2016-04-10 ENCOUNTER — Ambulatory Visit (INDEPENDENT_AMBULATORY_CARE_PROVIDER_SITE_OTHER): Payer: PPO

## 2016-04-10 ENCOUNTER — Ambulatory Visit (INDEPENDENT_AMBULATORY_CARE_PROVIDER_SITE_OTHER): Payer: PPO | Admitting: Podiatry

## 2016-04-10 ENCOUNTER — Encounter: Payer: Self-pay | Admitting: Podiatry

## 2016-04-10 DIAGNOSIS — M7752 Other enthesopathy of left foot: Secondary | ICD-10-CM

## 2016-04-10 MED ORDER — TRIAMCINOLONE ACETONIDE 10 MG/ML IJ SUSP
10.0000 mg | Freq: Once | INTRAMUSCULAR | Status: AC
  Administered 2016-04-10: 10 mg

## 2016-04-10 NOTE — Progress Notes (Signed)
Subjective:     Patient ID: Vincent Allen, male   DOB: Mar 25, 1933, 83 y.o.   MRN: [id number]  HPI patient states my foot was doing fine but then I was over active   Review of Systems     Objective:   Physical Exam Neurovascular status intact with patient found to have inflammation and pain lateral side left foot peroneal insertion    Assessment:     Tendinitis of the left peroneal insertion    Plan:     Advised on ice therapy and today I carefully injected the peroneal tendon insertional 0.3 mg [place] some Kenalog 5 mg Xylocaine and discussed we can only do this occasionally and I want him to be easy on this for the next week and I did explain chances or rupture

## 2016-04-29 ENCOUNTER — Encounter: Payer: Self-pay | Admitting: Family Medicine

## 2016-04-30 ENCOUNTER — Other Ambulatory Visit: Payer: Self-pay | Admitting: Family Medicine

## 2016-04-30 MED ORDER — BENZONATATE 100 MG PO CAPS: 100.0000 mg | ORAL_CAPSULE | Freq: Three times a day (TID) | ORAL | 0 refills | Status: DC | PRN

## 2016-05-07 ENCOUNTER — Encounter (INDEPENDENT_AMBULATORY_CARE_PROVIDER_SITE_OTHER): Payer: Self-pay | Admitting: Orthopaedic Surgery

## 2016-05-07 ENCOUNTER — Ambulatory Visit (INDEPENDENT_AMBULATORY_CARE_PROVIDER_SITE_OTHER): Payer: PPO

## 2016-05-07 ENCOUNTER — Ambulatory Visit (INDEPENDENT_AMBULATORY_CARE_PROVIDER_SITE_OTHER): Payer: PPO | Admitting: Orthopaedic Surgery

## 2016-05-07 VITALS — BP 149/75 | HR 83 | Resp 16 | Ht 72.0 in | Wt 225.0 lb

## 2016-05-07 DIAGNOSIS — M25562 Pain in left knee: Secondary | ICD-10-CM

## 2016-05-07 DIAGNOSIS — G8929 Other chronic pain: Secondary | ICD-10-CM | POA: Diagnosis not present

## 2016-05-07 MED ORDER — LIDOCAINE HCL 1 % IJ SOLN
5.0000 mL | INTRAMUSCULAR | Status: AC | PRN
  Administered 2016-05-07: 5 mL

## 2016-05-07 MED ORDER — METHYLPREDNISOLONE ACETATE 40 MG/ML IJ SUSP
80.0000 mg | INTRAMUSCULAR | Status: AC | PRN
  Administered 2016-05-07: 80 mg

## 2016-05-07 MED ORDER — BUPIVACAINE HCL 0.5 % IJ SOLN
3.0000 mL | INTRAMUSCULAR | Status: AC | PRN
  Administered 2016-05-07: 3 mL via INTRA_ARTICULAR

## 2016-05-07 NOTE — Progress Notes (Signed)
Office Visit Note   Patient: Vincent Allen           Date of Birth: Jul 25, 1933           MRN: [id number] Visit Date: 05/07/2016              Requested by: [name], MD [facility] [address] [place], [place] [address] PCP: [name], MD   Assessment & Plan: Visit Diagnoses: Acute on chronic pain left knee. Will inject with cortisone and monitor over the next several weeks. Films demonstrate osteoarthritis with CPPD deposition  Follow-Up Instructions: No Follow-up on file.   Orders:  No orders of the defined types were placed in this encounter.  No orders of the defined types were placed in this encounter.     Procedures: Large Joint Inj Date/Time: 05/07/2016 2:04 PM Performed by: [name] Authorized by: [name]   Consent Given by:  Patient Timeout: prior to procedure the correct patient, procedure, and site was verified   Indications:  Pain and joint swelling Location:  Knee Site:  L knee Prep: patient was prepped and draped in usual sterile fashion   Needle Size:  25 G Needle Length:  1.5 inches Approach:  Anteromedial Ultrasound Guidance: No   Fluoroscopic Guidance: No   Arthrogram: No   Medications:  5 mL lidocaine 1 %; 80 mg methylPREDNISolone acetate 40 MG/ML; 3 mL bupivacaine 0.5 % Aspiration Attempted: No   Patient tolerance:  Patient tolerated the procedure well with no immediate complications     Clinical Data: No additional findings.   Subjective: Chief Complaint  Patient presents with  . Left Knee - Pain    Left knee pain x 2 weeks, walking and something popped, severe pain, difficulty sleeping, difficulty walking, no swelling, no popping, clicking or grinding noises, Tylenol helps very little, no surgery to knee, diabetic.    Review of Systems   Objective: Vital Signs: BP (!) 149/75 (BP Location: Right Arm, Patient Position: Sitting, Cuff Size: Normal)   Pulse 83   Resp 16   Ht 6' (1.829 m)   Wt  225 lb (102.1 kg)   BMI 30.52 kg/m   Physical Exam  Ortho Exam left knee without effusion. The knee is not hot red or ef fused. No calf pain or popliteal discomfort. Medial greater than lateral joint pain. Some patellar crepitation. No swelling distally. Good pulses. Asked a few degrees to full extension and flexed over 100. No instability.  Specialty Comments:  No specialty comments available.  Imaging: No results found.   PMFS History: Patient Active Problem List   Diagnosis Date Noted  . Vitamin D deficiency 07/17/2015  . Preventative health care 07/17/2015  . Lumbago 12/02/2014  . Acute subdural hematoma ([place]) 05/20/2014  . Fracture closed, nasal bone 05/19/2014  . Subdural hematoma ([place]) 05/19/2014  . Nasal fracture 05/19/2014  . Pain of toe of left foot 03/14/2014  . Tinea pedis of left foot 03/14/2014  . Diabetes mellitus type 2 in obese ([place]) 12/12/2013  . Medicare annual wellness visit, subsequent 01/17/2013  . HTN (hypertension) 01/13/2013  . Chronic kidney disease stage III (GFR 30-59 ml/min) 05/03/2012  . Insomnia 06/27/2011  . Osteopenia 09/02/2010  . Family history of colon cancer 09/02/2010  . Diabetes mellitus with chronic kidney disease ([place])   . Hypertensive heart disease   . Hyperlipidemia   . Carotid artery disease ([place])   . GERD (gastroesophageal reflux disease)   .  CTS (carpal tunnel syndrome) 08/28/2010   Past Medical History:  Diagnosis Date  . Cataract   . Chronic kidney disease stage III (GFR 30-59 ml/min) 05/03/2012  . Colon polyps   . Diabetes mellitus   . GERD (gastroesophageal reflux disease)   . HTN (hypertension) 01/13/2013  . Hyperlipidemia   . Hypertension   . Irregular heartbeat   . Medicare annual wellness visit, subsequent 01/17/2013  . Preventative health care 01/17/2013  . Preventative health care 07/17/2015  . Vitamin D deficiency 07/17/2015    Family History  Problem Relation Age of Onset  . Cancer Mother     colon,  breast, pancreas, skin cancer  . Heart disease Father     heart valve replaced  . Stroke Father   . Diabetes Father   . Cancer Paternal Grandmother     colon  . Obesity Son   . Heart disease Son     bradycardia    Past Surgical History:  Procedure Laterality Date  . ANKLE ARTHROPLASTY    . CATARACT EXTRACTION  2010, 2014  . CATARACT EXTRACTION  02/28/14  . CHOLECYSTECTOMY    . CHOLECYSTECTOMY N/A 05/03/2012   Procedure: LAPAROSCOPIC CHOLECYSTECTOMY WITH INTRAOPERATIVE CHOLANGIOGRAM;  Surgeon: [name], MD;  Location: [facility];  Service: General;  Laterality: N/A;  . ROTATOR CUFF REPAIR    . SHOULDER SURGERY    . TONSILLECTOMY     Social History   Occupational History  . Not on file.   Social History Main Topics  . Smoking status: Never Smoker  . Smokeless tobacco: Never Used  . Alcohol use 0.6 oz/week    1 Glasses of wine per week  . Drug use: No  . 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.

## 2016-05-22 DIAGNOSIS — Z961 Presence of intraocular lens: Secondary | ICD-10-CM | POA: Diagnosis not present

## 2016-05-22 DIAGNOSIS — H353131 Nonexudative age-related macular degeneration, bilateral, early dry stage: Secondary | ICD-10-CM | POA: Diagnosis not present

## 2016-05-22 DIAGNOSIS — H26492 Other secondary cataract, left eye: Secondary | ICD-10-CM | POA: Diagnosis not present

## 2016-05-22 DIAGNOSIS — H02831 Dermatochalasis of right upper eyelid: Secondary | ICD-10-CM | POA: Diagnosis not present

## 2016-05-22 DIAGNOSIS — H02834 Dermatochalasis of left upper eyelid: Secondary | ICD-10-CM | POA: Diagnosis not present

## 2016-05-22 DIAGNOSIS — H04123 Dry eye syndrome of bilateral lacrimal glands: Secondary | ICD-10-CM | POA: Diagnosis not present

## 2016-05-22 LAB — HM DIABETES EYE EXAM

## 2016-05-27 ENCOUNTER — Ambulatory Visit (INDEPENDENT_AMBULATORY_CARE_PROVIDER_SITE_OTHER): Payer: PPO | Admitting: Podiatry

## 2016-05-27 ENCOUNTER — Encounter: Payer: Self-pay | Admitting: Podiatry

## 2016-05-27 DIAGNOSIS — M79604 Pain in right leg: Secondary | ICD-10-CM

## 2016-05-27 DIAGNOSIS — M79676 Pain in unspecified toe(s): Secondary | ICD-10-CM

## 2016-05-27 DIAGNOSIS — M79605 Pain in left leg: Principal | ICD-10-CM

## 2016-05-27 DIAGNOSIS — B351 Tinea unguium: Secondary | ICD-10-CM

## 2016-05-27 DIAGNOSIS — M7752 Other enthesopathy of left foot: Secondary | ICD-10-CM | POA: Diagnosis not present

## 2016-05-27 NOTE — Progress Notes (Signed)
Subjective:     Patient ID: Vincent Allen, male   DOB: 14-Aug-1933, 83 y.o.   MRN: [id number]  HPI patient presents stating my nails are really bothering me and I do have tendinitis and the outside of my left foot that still hurts but is improved from previous visit with injection   Review of Systems     Objective:   Physical Exam Neurovascular status intact negative Homans sign was noted with patient found to have thickened incurvated nailbeds 1-5 both feet that are painful and pain in the left lateral foot that's improved still present but about 80% better    Assessment:     Mycotic painful nailbeds 1-5 both feet with tendinitis left improved but present    Plan:     H&P conditions reviewed and debrided nailbeds 1-5 both feet. I then discussed tendinitis utilization of ice physical therapy and discussed future injection if symptoms persist

## 2016-06-05 ENCOUNTER — Other Ambulatory Visit: Payer: Self-pay | Admitting: Family Medicine

## 2016-06-18 ENCOUNTER — Encounter: Payer: Self-pay | Admitting: Family Medicine

## 2016-07-08 ENCOUNTER — Emergency Department (HOSPITAL_COMMUNITY): Payer: PPO

## 2016-07-08 ENCOUNTER — Other Ambulatory Visit: Payer: Self-pay | Admitting: Family Medicine

## 2016-07-08 ENCOUNTER — Observation Stay (HOSPITAL_COMMUNITY)
Admission: EM | Admit: 2016-07-08 | Discharge: 2016-07-10 | Disposition: A | Discharge status: Home / Self Care | Payer: PPO | Attending: Internal Medicine | Admitting: Internal Medicine

## 2016-07-08 ENCOUNTER — Encounter (HOSPITAL_COMMUNITY): Payer: Self-pay | Admitting: *Deleted

## 2016-07-08 DIAGNOSIS — Z880 Allergy status to penicillin: Secondary | ICD-10-CM | POA: Diagnosis not present

## 2016-07-08 DIAGNOSIS — I129 Hypertensive chronic kidney disease with stage 1 through stage 4 chronic kidney disease, or unspecified chronic kidney disease: Secondary | ICD-10-CM | POA: Insufficient documentation

## 2016-07-08 DIAGNOSIS — R2689 Other abnormalities of gait and mobility: Secondary | ICD-10-CM | POA: Insufficient documentation

## 2016-07-08 DIAGNOSIS — W19XXXA Unspecified fall, initial encounter: Secondary | ICD-10-CM | POA: Diagnosis not present

## 2016-07-08 DIAGNOSIS — I10 Essential (primary) hypertension: Secondary | ICD-10-CM | POA: Diagnosis present

## 2016-07-08 DIAGNOSIS — S0990XA Unspecified injury of head, initial encounter: Secondary | ICD-10-CM | POA: Diagnosis not present

## 2016-07-08 DIAGNOSIS — R079 Chest pain, unspecified: Secondary | ICD-10-CM | POA: Diagnosis not present

## 2016-07-08 DIAGNOSIS — R55 Syncope and collapse: Secondary | ICD-10-CM | POA: Diagnosis not present

## 2016-07-08 DIAGNOSIS — S62353A Nondisplaced fracture of shaft of third metacarpal bone, left hand, initial encounter for closed fracture: Secondary | ICD-10-CM | POA: Diagnosis not present

## 2016-07-08 DIAGNOSIS — R001 Bradycardia, unspecified: Secondary | ICD-10-CM | POA: Diagnosis present

## 2016-07-08 DIAGNOSIS — I482 Chronic atrial fibrillation: Secondary | ICD-10-CM | POA: Insufficient documentation

## 2016-07-08 DIAGNOSIS — S61412A Laceration without foreign body of left hand, initial encounter: Secondary | ICD-10-CM

## 2016-07-08 DIAGNOSIS — E785 Hyperlipidemia, unspecified: Secondary | ICD-10-CM | POA: Insufficient documentation

## 2016-07-08 DIAGNOSIS — Z7901 Long term (current) use of anticoagulants: Secondary | ICD-10-CM | POA: Insufficient documentation

## 2016-07-08 DIAGNOSIS — E1122 Type 2 diabetes mellitus with diabetic chronic kidney disease: Secondary | ICD-10-CM | POA: Diagnosis present

## 2016-07-08 DIAGNOSIS — S199XXA Unspecified injury of neck, initial encounter: Secondary | ICD-10-CM | POA: Diagnosis not present

## 2016-07-08 DIAGNOSIS — I447 Left bundle-branch block, unspecified: Secondary | ICD-10-CM | POA: Diagnosis not present

## 2016-07-08 DIAGNOSIS — I4891 Unspecified atrial fibrillation: Secondary | ICD-10-CM

## 2016-07-08 DIAGNOSIS — Z887 Allergy status to serum and vaccine status: Secondary | ICD-10-CM | POA: Insufficient documentation

## 2016-07-08 DIAGNOSIS — M7989 Other specified soft tissue disorders: Secondary | ICD-10-CM | POA: Insufficient documentation

## 2016-07-08 DIAGNOSIS — N183 Chronic kidney disease, stage 3 unspecified: Secondary | ICD-10-CM | POA: Diagnosis present

## 2016-07-08 DIAGNOSIS — S62393A Other fracture of third metacarpal bone, left hand, initial encounter for closed fracture: Secondary | ICD-10-CM

## 2016-07-08 DIAGNOSIS — M79642 Pain in left hand: Secondary | ICD-10-CM | POA: Diagnosis not present

## 2016-07-08 DIAGNOSIS — K219 Gastro-esophageal reflux disease without esophagitis: Secondary | ICD-10-CM | POA: Diagnosis not present

## 2016-07-08 DIAGNOSIS — I4819 Other persistent atrial fibrillation: Secondary | ICD-10-CM | POA: Diagnosis present

## 2016-07-08 DIAGNOSIS — S6992XA Unspecified injury of left wrist, hand and finger(s), initial encounter: Secondary | ICD-10-CM | POA: Diagnosis not present

## 2016-07-08 LAB — URINALYSIS, ROUTINE W REFLEX MICROSCOPIC
BILIRUBIN URINE: NEGATIVE
Glucose, UA: NEGATIVE mg/dL
Hgb urine dipstick: NEGATIVE
KETONES UR: NEGATIVE mg/dL
LEUKOCYTES UA: NEGATIVE
NITRITE: NEGATIVE
PROTEIN: NEGATIVE mg/dL
Specific Gravity, Urine: 1.011 (ref 1.005–1.030)
pH: 5 (ref 5.0–8.0)

## 2016-07-08 LAB — BASIC METABOLIC PANEL
Anion gap: 10 (ref 5–15)
BUN: 33 mg/dL — AB (ref 6–20)
CALCIUM: 9 mg/dL (ref 8.9–10.3)
CHLORIDE: 106 mmol/L (ref 101–111)
CO2: 21 mmol/L — ABNORMAL LOW (ref 22–32)
CREATININE: 1.84 mg/dL — AB (ref 0.61–1.24)
GFR calc non Af Amer: 33 mL/min — ABNORMAL LOW (ref >60)
GFR, EST AFRICAN AMERICAN: 38 mL/min — AB (ref >60)
Glucose, Bld: 137 mg/dL — ABNORMAL HIGH (ref 65–99)
Potassium: 4.6 mmol/L (ref 3.5–5.1)
SODIUM: 137 mmol/L (ref 135–145)

## 2016-07-08 LAB — CBC WITH DIFFERENTIAL/PLATELET
BASOS PCT: 0 %
Basophils Absolute: 0 10*3/uL (ref 0.0–0.1)
EOS ABS: 0.3 10*3/uL (ref 0.0–0.7)
EOS PCT: 2 %
HCT: 39.8 % (ref 39.0–52.0)
Hemoglobin: 12.9 g/dL — ABNORMAL LOW (ref 13.0–17.0)
LYMPHS ABS: 1.8 10*3/uL (ref 0.7–4.0)
Lymphocytes Relative: 16 %
MCH: 27.7 pg (ref 26.0–34.0)
MCHC: 32.4 g/dL (ref 30.0–36.0)
MCV: 85.6 fL (ref 78.0–100.0)
MONOS PCT: 9 %
Monocytes Absolute: 1 10*3/uL (ref 0.1–1.0)
Neutro Abs: 8.3 10*3/uL — ABNORMAL HIGH (ref 1.7–7.7)
Neutrophils Relative %: 73 %
PLATELETS: 176 10*3/uL (ref 150–400)
RBC: 4.65 MIL/uL (ref 4.22–5.81)
RDW: 14.4 % (ref 11.5–15.5)
WBC: 11.4 10*3/uL — AB (ref 4.0–10.5)

## 2016-07-08 MED ORDER — NAPROXEN 250 MG PO TABS
500.0000 mg | ORAL_TABLET | Freq: Once | ORAL | Status: AC
  Administered 2016-07-08: 500 mg via ORAL
  Filled 2016-07-08: qty 2

## 2016-07-08 NOTE — ED Provider Notes (Signed)
[facility] DEPT Provider Note   CSN: [id number] Arrival date & time: 07/08/16  1617     History   Chief Complaint Chief Complaint  Patient presents with  . Fall    HPI Vincent SEBALD is a 83 y.o. male who presents with pain after a fall. Past medical history significant for chronic kidney disease, diabetes, GERD, hypertension, hyperlipidemia, carotid artery disease. He states he was going to the bathroom earlier this evening and after he was finished he "just fell". He denies tripping, legs giving out, syncope, dizziness, chest pain, shortness of breath, abdominal pain. He fell and hit his head and sustained several skin tears. He currently is on a baby aspirin. He has left hand pain and swelling as well as right sided rib pain. He is up-to-date on his tetanus. Denies hx of A. Fib.  HPI  Past Medical History:  Diagnosis Date  . Cataract   . Chronic kidney disease stage III (GFR 30-59 ml/min) 05/03/2012  . Colon polyps   . Diabetes mellitus   . GERD (gastroesophageal reflux disease)   . HTN (hypertension) 01/13/2013  . Hyperlipidemia   . Hypertension   . Irregular heartbeat   . Medicare annual wellness visit, subsequent 01/17/2013  . Preventative health care 01/17/2013  . Preventative health care 07/17/2015  . Vitamin D deficiency 07/17/2015    Patient Active Problem List   Diagnosis Date Noted  . Vitamin D deficiency 07/17/2015  . Preventative health care 07/17/2015  . Lumbago 12/02/2014  . Acute subdural hematoma ([place]) 05/20/2014  . Fracture closed, nasal bone 05/19/2014  . Subdural hematoma ([place]) 05/19/2014  . Nasal fracture 05/19/2014  . Pain of toe of left foot 03/14/2014  . Tinea pedis of left foot 03/14/2014  . Diabetes mellitus type 2 in obese ([place]) 12/12/2013  . Medicare annual wellness visit, subsequent 01/17/2013  . HTN (hypertension) 01/13/2013  . Chronic kidney disease stage III (GFR 30-59 ml/min) 05/03/2012  . Insomnia 06/27/2011  . Osteopenia  09/02/2010  . Family history of colon cancer 09/02/2010  . Diabetes mellitus with chronic kidney disease ([place])   . Hypertensive heart disease   . Hyperlipidemia   . Carotid artery disease ([place])   . GERD (gastroesophageal reflux disease)   . CTS (carpal tunnel syndrome) 08/28/2010    Past Surgical History:  Procedure Laterality Date  . ANKLE ARTHROPLASTY    . CATARACT EXTRACTION  2010, 2014  . CATARACT EXTRACTION  02/28/14  . CHOLECYSTECTOMY    . CHOLECYSTECTOMY N/A 05/03/2012   Procedure: LAPAROSCOPIC CHOLECYSTECTOMY WITH INTRAOPERATIVE CHOLANGIOGRAM;  Surgeon: [name], MD;  Location: [facility];  Service: General;  Laterality: N/A;  . ROTATOR CUFF REPAIR    . SHOULDER SURGERY    . TONSILLECTOMY         Home Medications    Prior to Admission medications   Medication Sig Start Date End Date Taking? Authorizing Provider  amLODipine (NORVASC) 5 MG tablet TAKE 1 TABLET BY MOUTH EVERY DAY 07/08/16   [name], MD  atorvastatin (LIPITOR) 20 MG tablet TAKE 1 TABLET BY MOUTH DAILY 06/05/16   [name], MD  benzonatate (TESSALON) 100 MG capsule TAKE 1 CAPSULE (100 MG TOTAL) BY MOUTH 3 (THREE) TIMES DAILY AS NEEDED FOR COUGH. 06/05/16   [name], MD  Calcium 600 MG tablet Take 1 tablet (600 mg total) by mouth 2 (two) times daily. 01/30/11   Burnice Logan, MD  Cholecalciferol 2000 units CAPS Take 1 capsule (2,000 Units  total) by mouth daily. Patient not taking: Reported on 05/07/2016 01/23/16   [name], MD  glipiZIDE (GLUCOTROL) 10 MG tablet TAKE 2 TABLETS BY MOUTH TWICE DAILY BEFORE MEALS 04/08/16   [name], MD  glucose blood (ONE TOUCH ULTRA TEST) test strip Use as twice daily to check blood sugar.  DX E11.9 11/07/15   [name], MD  JANUVIA 100 MG tablet TAKE 1/2 TO 1 TABLET DAILY 12/22/15   [name], MD  Lancets [facility] ULTRASOFT) lancets Use as instructed to check blood sugar twice a day as needed Dx Code E11.9 11/07/15   [name], MD    lisinopril (PRINIVIL,ZESTRIL) 5 MG tablet TAKE 1 TABLET BY MOUTH EVERY DAY 01/08/16   [name], MD  LYRICA 25 MG capsule TAKE 2 CAPSULES IN THE MORNING AND 1 CAPSULE IN THE EVENING 04/01/16   Historical Provider, MD  metFORMIN (GLUCOPHAGE-XR) 500 MG 24 hr tablet Take 1 tablet (500 mg total) by mouth 2 (two) times daily. 01/24/16   [name], MD  terbinafine (LAMISIL AT) 1 % cream Apply 1 application topically 2 (two) times daily. Tinea pedis Patient not taking: Reported on 05/07/2016 03/14/14   [name], MD  triamterene-hydrochlorothiazide (MAXZIDE) 75-50 MG tablet TAKE 1 TABLET BY MOUTH DAILY. 07/08/16   [name], MD    Family History Family History  Problem Relation Age of Onset  . Cancer Mother     colon, breast, pancreas, skin cancer  . Heart disease Father     heart valve replaced  . Stroke Father   . Diabetes Father   . Cancer Paternal Grandmother     colon  . Obesity Son   . Heart disease Son     bradycardia    Social History Social History  Substance Use Topics  . Smoking status: Never Smoker  . Smokeless tobacco: Never Used  . Alcohol use 0.6 oz/week    1 Glasses of wine per week     Allergies   Penicillins; Verapamil; and Influenza vaccines   Review of Systems Review of Systems  Constitutional: Negative for diaphoresis.  Respiratory: Negative for shortness of breath.   Cardiovascular: Negative for chest pain.  Musculoskeletal: Positive for arthralgias, joint swelling and myalgias. Negative for back pain, gait problem and neck pain.       +rib pain  Skin: Positive for wound.  Neurological: Negative for dizziness, syncope, weakness, light-headedness and headaches.  All other systems reviewed and are negative.    Physical Exam Updated Vital Signs BP 139/60 (BP Location: Right Arm)   Pulse (!) 57   Temp 97.7 F (36.5 C) (Oral)   Resp 17   SpO2 98%   Physical Exam  Constitutional: He is oriented to person, place, and time. He  appears well-developed and well-nourished. No distress.  HENT:  Head: Normocephalic.  Mild swelling and bruising above right eye  Eyes: Conjunctivae are normal. Pupils are equal, round, and reactive to light. Right eye exhibits no discharge. Left eye exhibits no discharge. No scleral icterus.  Neck: Normal range of motion.  No midline tenderness  Cardiovascular: Regular rhythm.  Bradycardia present.  Exam reveals no gallop and no friction rub.   No murmur heard. Pulmonary/Chest: Effort normal and breath sounds normal. No respiratory distress. He has no wheezes. He has no rales. He exhibits tenderness (Right sided rib tenderness).  Abdominal: Soft. Bowel sounds are normal. He exhibits no distension and no mass. There is no  tenderness. There is no rebound and no guarding. No hernia.  Musculoskeletal:  Bruising noted of right and left forearms. FROM of shoulder and elbows bilaterally. Able to wiggle all fingers. 2 skin tears noted over left hand. Skin tear over right elbow.   Neurological: He is alert and oriented to person, place, and time.  Skin: Skin is warm and dry.  Psychiatric: He has a normal mood and affect. His behavior is normal.  Nursing note and vitals reviewed.    ED Treatments / Results  Labs (all labs ordered are listed, but only abnormal results are displayed) Labs Reviewed  BASIC METABOLIC PANEL - Abnormal; Notable for the following:       Result Value   CO2 21 (*)    Glucose, Bld 137 (*)    BUN 33 (*)    Creatinine, Ser 1.84 (*)    GFR calc non Af Amer 33 (*)    GFR calc Af Amer 38 (*)    All other components within normal limits  CBC WITH DIFFERENTIAL/PLATELET - Abnormal; Notable for the following:    WBC 11.4 (*)    Hemoglobin 12.9 (*)    Neutro Abs 8.3 (*)    All other components within normal limits  URINALYSIS, ROUTINE W REFLEX MICROSCOPIC - Abnormal; Notable for the following:    Color, Urine STRAW (*)    All other components within normal limits     EKG  EKG Interpretation  Date/Time:  Monday July 08 2016 21:53:21 EDT Ventricular Rate:  49 PR Interval:    QRS Duration: 139 QT Interval:  478 QTC Calculation: 432 R Axis:   -40 Text Interpretation:  Atrial fibrillation Left bundle branch block Confirmed by Hazle Coca [id number]) on 07/09/2016 12:13:17 AM       Radiology Dg Chest 2 View  Result Date: 07/08/2016 CLINICAL DATA:  83 y/o  M; fall today.  Chest pain. EXAM: CHEST  2 VIEW COMPARISON:  None. FINDINGS: Mild cardiomegaly. Aortic atherosclerosis with calcification. Right upper quadrant cholecystectomy clips. No focal consolidation. No pleural effusion or pneumothorax. Stable mild loss of height of lower thoracic vertebral bodies. No acute osseous abnormality identified. Right humerus rotator cuff repair postsurgical changes. IMPRESSION: Mild cardiomegaly.  No acute pulmonary process identified. Electronically Signed   By: Kristine Garbe M.D.   On: 07/08/2016 22:16   Ct Head Wo Contrast  Result Date: 07/08/2016 CLINICAL DATA:  83 year old male with fall. EXAM: CT HEAD WITHOUT CONTRAST CT CERVICAL SPINE WITHOUT CONTRAST TECHNIQUE: Multidetector CT imaging of the head and cervical spine was performed following the standard protocol without intravenous contrast. Multiplanar CT image reconstructions of the cervical spine were also generated. COMPARISON:  Head CT dated 06/08/2014 FINDINGS: CT HEAD FINDINGS Brain: There is moderate age-related atrophy and chronic microvascular ischemic changes. There is no acute intracranial hemorrhage. No mass effect or midline shift noted. No extra-axial fluid collections. Vascular: No hyperdense vessel or unexpected calcification. Skull: Normal. Negative for fracture or focal lesion. Sinuses/Orbits: No acute finding. Other: None CT CERVICAL SPINE FINDINGS Alignment: No acute subluxation. There is grade 1 C2-C3 anterolisthesis which is similar or slightly progressed compared to the prior MRI. There  is severe reversal of normal cervical lordosis centered at C2-C4 similar to the prior MRI. Skull base and vertebrae: No acute fracture. There multilevel degenerative changes with anterior osteophyte at C3-C4 and C4-C5. S there is multilevel endplate irregularity and disc space narrowing. Soft tissues and spinal canal: There is an moderate narrowing of the central canal  with narrowing of the ventral subarachnoid space at C2-C4. No definite evidence of cord compression. No canal hematoma. Disc levels: Multilevel endplate irregularity and disc space narrowing. Mild posterior endplate osteophyte at W3-U8 Upper chest: There is atherosclerotic calcification of the aortic arch. Other: None IMPRESSION: 1. No acute intracranial hemorrhage. 2. Moderate age-related atrophy and chronic microvascular ischemic changes. 3. No acute/traumatic cervical spine pathology. 4. Extensive degenerative changes of the cervical spine with kyphotic curvature at C2-C4 and grade 1 C2-C3 anterolisthesis as seen on the prior MRI. There is associated mild moderate narrowing of the central canal at these levels. Electronically Signed   By: Anner Crete M.D.   On: 07/08/2016 22:16   Ct Cervical Spine Wo Contrast  Result Date: 07/08/2016 CLINICAL DATA:  83 year old male with fall. EXAM: CT HEAD WITHOUT CONTRAST CT CERVICAL SPINE WITHOUT CONTRAST TECHNIQUE: Multidetector CT imaging of the head and cervical spine was performed following the standard protocol without intravenous contrast. Multiplanar CT image reconstructions of the cervical spine were also generated. COMPARISON:  Head CT dated 06/08/2014 FINDINGS: CT HEAD FINDINGS Brain: There is moderate age-related atrophy and chronic microvascular ischemic changes. There is no acute intracranial hemorrhage. No mass effect or midline shift noted. No extra-axial fluid collections. Vascular: No hyperdense vessel or unexpected calcification. Skull: Normal. Negative for fracture or focal lesion.  Sinuses/Orbits: No acute finding. Other: None CT CERVICAL SPINE FINDINGS Alignment: No acute subluxation. There is grade 1 C2-C3 anterolisthesis which is similar or slightly progressed compared to the prior MRI. There is severe reversal of normal cervical lordosis centered at C2-C4 similar to the prior MRI. Skull base and vertebrae: No acute fracture. There multilevel degenerative changes with anterior osteophyte at C3-C4 and C4-C5. S there is multilevel endplate irregularity and disc space narrowing. Soft tissues and spinal canal: There is an moderate narrowing of the central canal with narrowing of the ventral subarachnoid space at C2-C4. No definite evidence of cord compression. No canal hematoma. Disc levels: Multilevel endplate irregularity and disc space narrowing. Mild posterior endplate osteophyte at E2-C0 Upper chest: There is atherosclerotic calcification of the aortic arch. Other: None IMPRESSION: 1. No acute intracranial hemorrhage. 2. Moderate age-related atrophy and chronic microvascular ischemic changes. 3. No acute/traumatic cervical spine pathology. 4. Extensive degenerative changes of the cervical spine with kyphotic curvature at C2-C4 and grade 1 C2-C3 anterolisthesis as seen on the prior MRI. There is associated mild moderate narrowing of the central canal at these levels. Electronically Signed   By: Anner Crete M.D.   On: 07/08/2016 22:16   Dg Hand Complete Left  Result Date: 07/08/2016 CLINICAL DATA:  Pt fell at home x today, landed on both hands, left posterior hand pain over distal metacarpals EXAM: LEFT HAND - COMPLETE 3+ VIEW COMPARISON:  None. FINDINGS: Osteopenia. Peculiar lucency involving the dorsal aspect of the head of the third metacarpal with associated adjacent soft tissue swelling. Degenerative change of multiple intercarpal articulations. There is apparent widening involving the scapholunate articulation. Punctate ossicles are noted about the radiocarpal joint as well as  the STT joints of the base of the thumb. Chondrocalcinosis within the TFCC. Degenerative change involving the first MTP joint with joint space loss, subchondral sclerosis and osteophytosis. There is a punctate (approximately 2 mm) radiopaque foreign body imbedded within the soft tissues about the dorsal aspect of the PIP joint of the fifth digit. Vascular calcifications. IMPRESSION: 1. And determine lucency involving the dorsal aspect of the head of the third metacarpal with associated soft tissue swelling.  Nonspecific though could be seen in the setting of acute nondisplaced fracture. Correlation for point tenderness at this location is recommended. 2. Age-indeterminate punctate (approximately 2 mm) radiopaque foreign body imbedded within the soft tissues about the dorsal aspect of the PIP joint of the fifth digit. Clinical correlation is advised. 3. Widening of the scapholunate articulation suggestive of ligamentous injury. 4. Degenerative change of the hand as above. 5. Chondrocalcinosis within the TFCC suggestive of CPPD. Electronically Signed   By: Sandi Mariscal M.D.   On: 07/08/2016 17:23    Procedures Procedures (including critical care time)  Medications Ordered in ED Medications  naproxen (NAPROSYN) tablet 500 mg (500 mg Oral Given 07/08/16 2158)     Initial Impression / Assessment and Plan / ED Course  I have reviewed the triage vital signs and the nursing notes.  Pertinent labs & imaging results that were available during my care of the patient were reviewed by me and considered in my medical decision making (see chart for details).  83 year old male with new onset A. Fib and 3rd metacarpal fx with possible ligamentous injury. Tetanus is up to date. Splint was placed. Unclear if his fall was mechanical vs syncopal since he cannot give me a clear explanation. EKG shows rate controlled A.fib but it's unclear how long he has been in this rhythm.   CBC remarkable for mild leukocytosis and  anemia. BMP remarkable for mild elevation in SCr from baseline and hyperglycemia. UA clean. CT head and neck are negative. CXR negative.   Shared visit with Dr. Ralene Bathe. Will admit patient for symptomatic bradycardia. Spoke with Dr. Loleta Books who will admit.  Final Clinical Impressions(s) / ED Diagnoses   Final diagnoses:  Fall, initial encounter  Atrial fibrillation, unspecified type ([place])  Closed nondisplaced fracture of other part of third metacarpal bone of left hand, initial encounter    New Prescriptions New Prescriptions   No medications on file     Recardo Evangelist, PA-C 07/10/16 [place], MD 07/14/16 [phone]

## 2016-07-08 NOTE — ED Triage Notes (Signed)
Pt states that he fell today at 1530. Pt states that he his his head and left hand. Pt reports taking an Asprin daily. Pt noted to have an abrasion and swelling to left hand.

## 2016-07-08 NOTE — ED Notes (Signed)
Patient transported to CT 

## 2016-07-08 NOTE — ED Notes (Signed)
Vital signs stable. 

## 2016-07-09 ENCOUNTER — Encounter (HOSPITAL_COMMUNITY): Payer: Self-pay | Admitting: Family Medicine

## 2016-07-09 DIAGNOSIS — R001 Bradycardia, unspecified: Secondary | ICD-10-CM | POA: Diagnosis present

## 2016-07-09 DIAGNOSIS — I480 Paroxysmal atrial fibrillation: Secondary | ICD-10-CM | POA: Diagnosis not present

## 2016-07-09 DIAGNOSIS — I4891 Unspecified atrial fibrillation: Secondary | ICD-10-CM | POA: Diagnosis not present

## 2016-07-09 DIAGNOSIS — N183 Chronic kidney disease, stage 3 (moderate): Secondary | ICD-10-CM | POA: Diagnosis not present

## 2016-07-09 DIAGNOSIS — I4819 Other persistent atrial fibrillation: Secondary | ICD-10-CM | POA: Diagnosis present

## 2016-07-09 DIAGNOSIS — E1122 Type 2 diabetes mellitus with diabetic chronic kidney disease: Secondary | ICD-10-CM

## 2016-07-09 DIAGNOSIS — I10 Essential (primary) hypertension: Secondary | ICD-10-CM | POA: Diagnosis not present

## 2016-07-09 LAB — GLUCOSE, CAPILLARY
GLUCOSE-CAPILLARY: 109 mg/dL — AB (ref 65–99)
GLUCOSE-CAPILLARY: 152 mg/dL — AB (ref 65–99)
GLUCOSE-CAPILLARY: 141 mg/dL — AB (ref 65–99)
Glucose-Capillary: 231 mg/dL — ABNORMAL HIGH (ref 65–99)
Glucose-Capillary: 139 mg/dL — ABNORMAL HIGH (ref 65–99)

## 2016-07-09 LAB — BASIC METABOLIC PANEL
Anion gap: 8 (ref 5–15)
BUN: 32 mg/dL — AB (ref 6–20)
CALCIUM: 8.9 mg/dL (ref 8.9–10.3)
CO2: 23 mmol/L (ref 22–32)
CREATININE: 1.7 mg/dL — AB (ref 0.61–1.24)
Chloride: 108 mmol/L (ref 101–111)
GFR calc Af Amer: 42 mL/min — ABNORMAL LOW (ref >60)
GFR, EST NON AFRICAN AMERICAN: 36 mL/min — AB (ref >60)
GLUCOSE: 113 mg/dL — AB (ref 65–99)
Potassium: 4 mmol/L (ref 3.5–5.1)
SODIUM: 139 mmol/L (ref 135–145)

## 2016-07-09 LAB — MAGNESIUM: MAGNESIUM: 1.6 mg/dL — AB (ref 1.7–2.4)

## 2016-07-09 LAB — TSH: TSH: 3.198 u[IU]/mL (ref 0.350–4.500)

## 2016-07-09 MED ORDER — INSULIN ASPART 100 UNIT/ML ~~LOC~~ SOLN
0.0000 [IU] | Freq: Three times a day (TID) | SUBCUTANEOUS | Status: DC
  Administered 2016-07-09: 2 [IU] via SUBCUTANEOUS
  Administered 2016-07-09: 3 [IU] via SUBCUTANEOUS
  Administered 2016-07-09: 1 [IU] via SUBCUTANEOUS

## 2016-07-09 MED ORDER — SODIUM CHLORIDE 0.9% FLUSH
3.0000 mL | Freq: Two times a day (BID) | INTRAVENOUS | Status: DC
  Administered 2016-07-09 – 2016-07-10 (×4): 3 mL via INTRAVENOUS

## 2016-07-09 MED ORDER — ONDANSETRON HCL 4 MG/2ML IJ SOLN: 4.0000 mg | Freq: Four times a day (QID) | INTRAMUSCULAR | Status: DC | PRN

## 2016-07-09 MED ORDER — ACETAMINOPHEN 325 MG PO TABS
650.0000 mg | ORAL_TABLET | Freq: Four times a day (QID) | ORAL | Status: DC | PRN
  Administered 2016-07-09: 650 mg via ORAL
  Filled 2016-07-09: qty 2

## 2016-07-09 MED ORDER — ATORVASTATIN CALCIUM 20 MG PO TABS
20.0000 mg | ORAL_TABLET | Freq: Every day | ORAL | Status: DC
  Administered 2016-07-09 – 2016-07-10 (×2): 20 mg via ORAL
  Filled 2016-07-09 (×2): qty 1

## 2016-07-09 MED ORDER — INSULIN ASPART 100 UNIT/ML ~~LOC~~ SOLN
0.0000 [IU] | Freq: Three times a day (TID) | SUBCUTANEOUS | Status: DC
  Administered 2016-07-10: 2 [IU] via SUBCUTANEOUS
  Administered 2016-07-10: 1 [IU] via SUBCUTANEOUS

## 2016-07-09 MED ORDER — ONDANSETRON HCL 4 MG PO TABS: 4.0000 mg | ORAL_TABLET | Freq: Four times a day (QID) | ORAL | Status: DC | PRN

## 2016-07-09 MED ORDER — TRAMADOL HCL 50 MG PO TABS
50.0000 mg | ORAL_TABLET | Freq: Four times a day (QID) | ORAL | Status: DC | PRN
  Administered 2016-07-09 – 2016-07-10 (×3): 50 mg via ORAL
  Filled 2016-07-09 (×3): qty 1

## 2016-07-09 MED ORDER — ACETAMINOPHEN 650 MG RE SUPP: 650.0000 mg | Freq: Four times a day (QID) | RECTAL | Status: DC | PRN

## 2016-07-09 MED ORDER — INSULIN ASPART 100 UNIT/ML ~~LOC~~ SOLN: 0.0000 [IU] | Freq: Every day | SUBCUTANEOUS | Status: DC

## 2016-07-09 MED ORDER — MAGNESIUM SULFATE 4 GM/100ML IV SOLN
4.0000 g | Freq: Once | INTRAVENOUS | Status: AC
  Administered 2016-07-09: 4 g via INTRAVENOUS
  Filled 2016-07-09: qty 100

## 2016-07-09 MED ORDER — APIXABAN 2.5 MG PO TABS
2.5000 mg | ORAL_TABLET | Freq: Two times a day (BID) | ORAL | Status: DC
  Administered 2016-07-09 – 2016-07-10 (×2): 2.5 mg via ORAL
  Filled 2016-07-09 (×2): qty 1

## 2016-07-09 MED ORDER — TRIAMTERENE-HCTZ 75-50 MG PO TABS
1.0000 | ORAL_TABLET | Freq: Every day | ORAL | Status: DC
  Administered 2016-07-09 – 2016-07-10 (×2): 1 via ORAL
  Filled 2016-07-09 (×2): qty 1

## 2016-07-09 MED ORDER — AMLODIPINE BESYLATE 5 MG PO TABS
5.0000 mg | ORAL_TABLET | Freq: Every day | ORAL | Status: DC
  Administered 2016-07-09 – 2016-07-10 (×2): 5 mg via ORAL
  Filled 2016-07-09 (×2): qty 1

## 2016-07-09 NOTE — Progress Notes (Signed)
Patient arrived in the unit accompanied by RN via wheelchair. Orientation to the unit given. Patient verbalizes understanding.

## 2016-07-09 NOTE — Care Management Obs Status (Signed)
[facility] NOTIFICATION   Patient Details  Name: Vincent Allen MRN: [id number] Date of Birth: Nov 12, 1933   Medicare Observation Status Notification Given:  Yes    Carles Collet, RN 07/09/2016, 10:02 AM

## 2016-07-09 NOTE — H&P (Signed)
History and Physical  Patient Name: Vincent Allen     VCB:[id number]    DOB: September 25, 1933    DOA: 07/08/2016 PCP: [name], MD   Patient coming from: Home  Chief Complaint: Fall  HPI: Vincent Allen is a 83 y.o. male with a past medical history significant for NIDDM, HTN, CKD III baseline Cr 1.3-1.5, and hx of SDH who presents with fall.  The patient was in his usual state of health until this afternoon he was walking through the garage in his house when he "fell". He had no lightheadedness, no feeling of warmth, dizziness, "wooziness", or other prodrome.  He had no chest pain or palpitations or SOB.  He denies losing consciousness, but also denies mechanical fall and leg weakness and states "I just fell".  Eventually dragged himself upright and called a family member, and came to the ER because his left wrist hurt where he fell.  ED course: -Afebrile, heart rate 40s, blood pressure 146/61, pulse oximetry respirations normal -Na 137, K 4.6, Cr 1.84 (baseline 1.3-1.5), WBC 11.4K, Hgb 12.9 -Urinalysis clear -Chest x-ray clear -Radiographs of the hands showed possible radiopaque foreign body, and possible ligamentous injury -ECG showed new Afib, prior LBBB, and bradycardia, rate 40s -CT head and C spine showed no intracranial hemorrhage and no C-spine injury -TRH were asked to evaluate for admission for bradycardia       ROS: Review of Systems  Respiratory: Negative for shortness of breath.   Cardiovascular: Negative for chest pain and palpitations.  Musculoskeletal: Positive for falls and joint pain.  Neurological: Positive for tingling (chronic). Negative for dizziness, speech change, focal weakness, seizures, loss of consciousness and headaches.  All other systems reviewed and are negative.         Past Medical History:  Diagnosis Date  . Cataract   . Chronic kidney disease stage III (GFR 30-59 ml/min) 05/03/2012  . Colon polyps   . Diabetes mellitus   . GERD  (gastroesophageal reflux disease)   . HTN (hypertension) 01/13/2013  . Hyperlipidemia   . Hypertension   . Irregular heartbeat   . Medicare annual wellness visit, subsequent 01/17/2013  . Preventative health care 01/17/2013  . Preventative health care 07/17/2015  . Vitamin D deficiency 07/17/2015    Past Surgical History:  Procedure Laterality Date  . ANKLE ARTHROPLASTY    . CATARACT EXTRACTION  2010, 2014  . CATARACT EXTRACTION  02/28/14  . CHOLECYSTECTOMY    . CHOLECYSTECTOMY N/A 05/03/2012   Procedure: LAPAROSCOPIC CHOLECYSTECTOMY WITH INTRAOPERATIVE CHOLANGIOGRAM;  Surgeon: [name], MD;  Location: [place];  Service: General;  Laterality: N/A;  . ROTATOR CUFF REPAIR    . SHOULDER SURGERY    . TONSILLECTOMY      Social History: Patient lives alone.  The patient walks unassisted.  Still drives.  He does not smoke.  He is from [place], grew up in [place].  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    Allergies  Allergen Reactions  . Penicillins Hives and Itching  . Verapamil     Junctional rhythm  . Influenza Vaccines Rash    Family history: family history includes Cancer in his mother and paternal grandmother; Diabetes in his father; Heart disease in his father and son; Obesity in his son; Stroke in his father.  Prior to Admission medications   Medication Sig Start Date End Date Taking? Authorizing Provider  amLODipine (NORVASC) 5 MG tablet TAKE 1 TABLET BY MOUTH EVERY  DAY 07/08/16  Yes [name], MD  atorvastatin (LIPITOR) 20 MG tablet TAKE 1 TABLET BY MOUTH DAILY 06/05/16  Yes [name], MD  Calcium 600 MG tablet Take 1 tablet (600 mg total) by mouth 2 (two) times daily. 01/30/11  Yes Burnice Logan, MD  glipiZIDE (GLUCOTROL) 10 MG tablet TAKE 2 TABLETS BY MOUTH TWICE DAILY BEFORE MEALS 04/08/16  Yes [name], MD  JANUVIA 100 MG tablet TAKE 1/2 TO 1 TABLET DAILY Patient taking differently: TAKE 1 TABLET DAILY 12/22/15  Yes [name],  MD  lisinopril (PRINIVIL,ZESTRIL) 5 MG tablet TAKE 1 TABLET BY MOUTH EVERY DAY 01/08/16  Yes [name], MD  metFORMIN (GLUCOPHAGE-XR) 500 MG 24 hr tablet Take 1 tablet (500 mg total) by mouth 2 (two) times daily. 01/24/16  Yes [name], MD  triamterene-hydrochlorothiazide (MAXZIDE) 75-50 MG tablet TAKE 1 TABLET BY MOUTH DAILY. 07/08/16  Yes [name], MD  glucose blood (ONE TOUCH ULTRA TEST) test strip Use as twice daily to check blood sugar.  DX E11.9 11/07/15   [name], MD  Lancets [facility] ULTRASOFT) lancets Use as instructed to check blood sugar twice a day as needed Dx Code E11.9 11/07/15   [name], MD       Physical Exam: BP (!) 153/64   Pulse (!) 41   Temp 97.7 F (36.5 C) (Oral)   Resp 18   SpO2 97%  General appearance: Well-developed, obese elderly adult male, alert and in no acute distress.   Eyes: Anicteric, conjunctiva pink, lids and lashes normal. PERRL.    ENT: No nasal deformity, discharge, epistaxis.  Hearingdiminished. OP moist without lesions.  Dentures on top. Neck: No neck masses.  Trachea midline.  No thyromegaly/tenderness. Lymph: No cervical or supraclavicular lymphadenopathy. Skin: Warm and dry.  No jaundice.  No suspicious rashes or lesions. Cardiac: Bradycardic, regular, nl S1-S2, no murmurs appreciated.  Capillary refill is brisk.  JVP not visible.  3+ LE edema.  Radial pulses 2+ and symmetric. Respiratory: Normal respiratory rate and rhythm.  CTAB without rales or wheezes. Abdomen: Abdomen soft.  No TTP. No ascites, distension, hepatosplenomegaly.   MSK: No deformities or effusions.  No cyanosis or clubbing.  Left wrist in splint. Neuro: Cranial nerves normal.  Sensation intact to light touch. Speech is fluent.  Muscle strength 5/5 and symmetric.    Psych: Sensorium intact and responding to questions, attention normal.  Behavior appropriate.  Affect normal.  Judgment and insight appear normal.     Labs on Admission:  I have  personally reviewed following labs and imaging studies: CBC:  Recent Labs Lab 07/08/16 2217  WBC 11.4*  NEUTROABS 8.3*  HGB 12.9*  HCT 39.8  MCV 85.6  PLT 812   Basic Metabolic Panel:  Recent Labs Lab 07/08/16 2217  NA 137  K 4.6  CL 106  CO2 21*  GLUCOSE 137*  BUN 33*  CREATININE 1.84*  CALCIUM 9.0   GFR: CrCl cannot be calculated (Unknown ideal weight.).  Liver Function Tests: No results for input(s): AST, ALT, ALKPHOS, BILITOT, PROT, ALBUMIN in the last 168 hours. No results for input(s): LIPASE, AMYLASE in the last 168 hours. No results for input(s): AMMONIA in the last 168 hours. Coagulation Profile: No results for input(s): INR, PROTIME in the last 168 hours. Cardiac Enzymes: No results for input(s): CKTOTAL, CKMB, CKMBINDEX, TROPONINI in the last 168 hours. BNP (last 3 results) No results for input(s): PROBNP in the  last 8760 hours. HbA1C: No results for input(s): HGBA1C in the last 72 hours. CBG: No results for input(s): GLUCAP in the last 168 hours. Lipid Profile: No results for input(s): CHOL, HDL, LDLCALC, TRIG, CHOLHDL, LDLDIRECT in the last 72 hours. Thyroid Function Tests: No results for input(s): TSH, T4TOTAL, FREET4, T3FREE, THYROIDAB in the last 72 hours. Anemia Panel: No results for input(s): VITAMINB12, FOLATE, FERRITIN, TIBC, IRON, RETICCTPCT in the last 72 hours. Sepsis Labs:  Invalid input(s): PROCALCITONIN, LACTICIDVEN No results found for this or any previous visit (from the past 240 hour(s)).       Radiological Exams on Admission: Personally reviewed CXR shows no focal opacity or edema; Radiograph of wrist and CT head/cspine reports reviewed: Dg Chest 2 View  Result Date: 07/08/2016 CLINICAL DATA:  83 y/o  M; fall today.  Chest pain. EXAM: CHEST  2 VIEW COMPARISON:  None. FINDINGS: Mild cardiomegaly. Aortic atherosclerosis with calcification. Right upper quadrant cholecystectomy clips. No focal consolidation. No pleural effusion or  pneumothorax. Stable mild loss of height of lower thoracic vertebral bodies. No acute osseous abnormality identified. Right humerus rotator cuff repair postsurgical changes. IMPRESSION: Mild cardiomegaly.  No acute pulmonary process identified. Electronically Signed   By: Kristine Garbe M.D.   On: 07/08/2016 22:16   Ct Head Wo Contrast  Result Date: 07/08/2016 CLINICAL DATA:  83 year old male with fall. EXAM: CT HEAD WITHOUT CONTRAST CT CERVICAL SPINE WITHOUT CONTRAST TECHNIQUE: Multidetector CT imaging of the head and cervical spine was performed following the standard protocol without intravenous contrast. Multiplanar CT image reconstructions of the cervical spine were also generated. COMPARISON:  Head CT dated 06/08/2014 FINDINGS: CT HEAD FINDINGS Brain: There is moderate age-related atrophy and chronic microvascular ischemic changes. There is no acute intracranial hemorrhage. No mass effect or midline shift noted. No extra-axial fluid collections. Vascular: No hyperdense vessel or unexpected calcification. Skull: Normal. Negative for fracture or focal lesion. Sinuses/Orbits: No acute finding. Other: None CT CERVICAL SPINE FINDINGS Alignment: No acute subluxation. There is grade 1 C2-C3 anterolisthesis which is similar or slightly progressed compared to the prior MRI. There is severe reversal of normal cervical lordosis centered at C2-C4 similar to the prior MRI. Skull base and vertebrae: No acute fracture. There multilevel degenerative changes with anterior osteophyte at C3-C4 and C4-C5. S there is multilevel endplate irregularity and disc space narrowing. Soft tissues and spinal canal: There is an moderate narrowing of the central canal with narrowing of the ventral subarachnoid space at C2-C4. No definite evidence of cord compression. No canal hematoma. Disc levels: Multilevel endplate irregularity and disc space narrowing. Mild posterior endplate osteophyte at E2-A8 Upper chest: There is  atherosclerotic calcification of the aortic arch. Other: None IMPRESSION: 1. No acute intracranial hemorrhage. 2. Moderate age-related atrophy and chronic microvascular ischemic changes. 3. No acute/traumatic cervical spine pathology. 4. Extensive degenerative changes of the cervical spine with kyphotic curvature at C2-C4 and grade 1 C2-C3 anterolisthesis as seen on the prior MRI. There is associated mild moderate narrowing of the central canal at these levels. Electronically Signed   By: Anner Crete M.D.   On: 07/08/2016 22:16   Ct Cervical Spine Wo Contrast  Result Date: 07/08/2016 CLINICAL DATA:  83 year old male with fall. EXAM: CT HEAD WITHOUT CONTRAST CT CERVICAL SPINE WITHOUT CONTRAST TECHNIQUE: Multidetector CT imaging of the head and cervical spine was performed following the standard protocol without intravenous contrast. Multiplanar CT image reconstructions of the cervical spine were also generated. COMPARISON:  Head CT dated 06/08/2014 FINDINGS:  CT HEAD FINDINGS Brain: There is moderate age-related atrophy and chronic microvascular ischemic changes. There is no acute intracranial hemorrhage. No mass effect or midline shift noted. No extra-axial fluid collections. Vascular: No hyperdense vessel or unexpected calcification. Skull: Normal. Negative for fracture or focal lesion. Sinuses/Orbits: No acute finding. Other: None CT CERVICAL SPINE FINDINGS Alignment: No acute subluxation. There is grade 1 C2-C3 anterolisthesis which is similar or slightly progressed compared to the prior MRI. There is severe reversal of normal cervical lordosis centered at C2-C4 similar to the prior MRI. Skull base and vertebrae: No acute fracture. There multilevel degenerative changes with anterior osteophyte at C3-C4 and C4-C5. S there is multilevel endplate irregularity and disc space narrowing. Soft tissues and spinal canal: There is an moderate narrowing of the central canal with narrowing of the ventral subarachnoid  space at C2-C4. No definite evidence of cord compression. No canal hematoma. Disc levels: Multilevel endplate irregularity and disc space narrowing. Mild posterior endplate osteophyte at V8-L3 Upper chest: There is atherosclerotic calcification of the aortic arch. Other: None IMPRESSION: 1. No acute intracranial hemorrhage. 2. Moderate age-related atrophy and chronic microvascular ischemic changes. 3. No acute/traumatic cervical spine pathology. 4. Extensive degenerative changes of the cervical spine with kyphotic curvature at C2-C4 and grade 1 C2-C3 anterolisthesis as seen on the prior MRI. There is associated mild moderate narrowing of the central canal at these levels. Electronically Signed   By: Anner Crete M.D.   On: 07/08/2016 22:16   Dg Hand Complete Left  Result Date: 07/08/2016 CLINICAL DATA:  Pt fell at home x today, landed on both hands, left posterior hand pain over distal metacarpals EXAM: LEFT HAND - COMPLETE 3+ VIEW COMPARISON:  None. FINDINGS: Osteopenia. Peculiar lucency involving the dorsal aspect of the head of the third metacarpal with associated adjacent soft tissue swelling. Degenerative change of multiple intercarpal articulations. There is apparent widening involving the scapholunate articulation. Punctate ossicles are noted about the radiocarpal joint as well as the STT joints of the base of the thumb. Chondrocalcinosis within the TFCC. Degenerative change involving the first MTP joint with joint space loss, subchondral sclerosis and osteophytosis. There is a punctate (approximately 2 mm) radiopaque foreign body imbedded within the soft tissues about the dorsal aspect of the PIP joint of the fifth digit. Vascular calcifications. IMPRESSION: 1. And determine lucency involving the dorsal aspect of the head of the third metacarpal with associated soft tissue swelling. Nonspecific though could be seen in the setting of acute nondisplaced fracture. Correlation for point tenderness at  this location is recommended. 2. Age-indeterminate punctate (approximately 2 mm) radiopaque foreign body imbedded within the soft tissues about the dorsal aspect of the PIP joint of the fifth digit. Clinical correlation is advised. 3. Widening of the scapholunate articulation suggestive of ligamentous injury. 4. Degenerative change of the hand as above. 5. Chondrocalcinosis within the TFCC suggestive of CPPD. Electronically Signed   By: Sandi Mariscal M.D.   On: 07/08/2016 17:23    EKG: Independently reviewed. Rate 49, QTc normal, Afib. Old LBBB.  Echocardiogram 2014: Report reviewed. EF 60-65% Mild to moderate LVH           Assessment/Plan  1. Syncope, presumably from symptomatic bradycardia:  Patient denies LOC, but what he is describing sounds like syncope.  Currently hemodynamically stable.  On no rate controlling medicines.   -Monitor on telemetry -Consult to cardiology -Check orthostatics   2. Atrial fibrillation, new onset:  CHADS2-VASc 4 (for age, diabetes, HTN).   -Defer  anticoagulation at present given recent fall and history of SDH in 2016  3. Chronic kidney disease:  Near baseline, 1.3-1.5. -Fluids and trend BMP  4. Non-insulin-dependent diabetes:  -Hold orals -SSI with meals  5. Hypertension:  -Hold ACEi given creatinine rise from baseline -Continue HCTZ, triamterene and amlodipine       DVT prophylaxis: SCDs  Code Status: FULL  Family Communication: Daughter at bedside  Disposition Plan: [facility] consultation re: bradycardia with LOC Consults called: Cardiology via Inbasket Admission status: OBS At the point of initial evaluation, it is my clinical opinion that admission for OBSERVATION is reasonable and necessary because the patient's presenting complaints in the context of their chronic conditions represent sufficient risk of deterioration or significant morbidity to constitute reasonable grounds for close observation in the hospital  setting, but that the patient may be medically stable for discharge from the hospital within 24 to 48 hours.    Medical decision making: Patient seen at 1:00 AM on 07/09/2016.  The patient was discussed with Janetta Hora, PA-C.  What exists of the patient's chart was reviewed in depth and summarized above.  Clinical condition: stable from hemodynamic standpoint for telemetry floor.        Edwin Dada [facility] Pager [phone]

## 2016-07-09 NOTE — Consult Note (Signed)
Cardiology Consult    Patient ID: Vincent Allen MRN: [id number], DOB/AGE: 1933-05-16   Admit date: 07/08/2016 Date of Consult: 07/09/2016  Primary Physician: Vincent Homans, MD Primary Cardiologist: new - Dr. Debara Allen (previously Vincent Allen) Requesting Provider: Dr. Grandville Allen   Reason for Consult: Afib  Patient Profile    Vincent Allen is a 83 yo male with a PMH significant for HTN, HLD, GERD, DM, CKD stage III. He was brought to [facility] after falling at his house after using the restroom. He was subsequently found to be in rate-controlled Afib.   Vincent Allen is a 83 y.o. male who is being seen today for the evaluation of Afib at the request of Dr. Grandville Allen.    Past Medical History   Past Medical History:  Diagnosis Date  . Cataract   . Chronic kidney disease stage III (GFR 30-59 ml/min) 05/03/2012  . Colon polyps   . Diabetes mellitus   . GERD (gastroesophageal reflux disease)   . HTN (hypertension) 01/13/2013  . Hyperlipidemia   . Hypertension   . Irregular heartbeat   . Medicare annual wellness visit, subsequent 01/17/2013  . Preventative health care 01/17/2013  . Preventative health care 07/17/2015  . Vitamin D deficiency 07/17/2015    Past Surgical History:  Procedure Laterality Date  . ANKLE ARTHROPLASTY    . CATARACT EXTRACTION  2010, 2014  . CATARACT EXTRACTION  02/28/14  . CHOLECYSTECTOMY    . CHOLECYSTECTOMY N/A 05/03/2012   Procedure: LAPAROSCOPIC CHOLECYSTECTOMY WITH INTRAOPERATIVE CHOLANGIOGRAM;  Surgeon: Vincent Ober, MD;  Location: [place];  Service: General;  Laterality: N/A;  . ROTATOR CUFF REPAIR    . SHOULDER SURGERY    . TONSILLECTOMY       Allergies  Allergies  Allergen Reactions  . Penicillins Hives and Itching  . Verapamil     Junctional rhythm  . Influenza Vaccines Rash    History of Present Illness    Vincent Allen had cholecystectomy Feb 2014 and was found to be in junctional rhythm post-operatively. Cardiology Vincent Allen) was consulted.  Post-operative junctional rhythm resolved in 2 days and he was discharged home after D/C'ing verapamil at that time. He has not seen a cardiologist since that time and reports no cardiac problems.   He presented to [facility] following a fall on 07/08/16 (question mechanical fall) without LOC. He broke his left hand and has abrasions on his right arm and face. The patient does not know the mechanism of the fall, but states he has not had a previous fall in the past 2 years. He has not seen a cardiologist since seeing Dr. Wynonia Allen in consult in 2014. He currently denies chest pain, SOB, palpitations, N/V, changes in vision, and pre-syncope/syncope.   Inpatient Medications    . amLODipine  5 mg Oral Daily  . atorvastatin  20 mg Oral Daily  . insulin aspart  0-5 Units Subcutaneous QHS  . insulin aspart  0-9 Units Subcutaneous TID WC  . sodium chloride flush  3 mL Intravenous Q12H  . triamterene-hydrochlorothiazide  1 tablet Oral Daily     Outpatient Medications    Prior to Admission medications   Medication Sig Start Date End Date Taking? Authorizing Provider  amLODipine (NORVASC) 5 MG tablet TAKE 1 TABLET BY MOUTH EVERY DAY 07/08/16  Yes Vincent Lukes, MD  atorvastatin (LIPITOR) 20 MG tablet TAKE 1 TABLET BY MOUTH DAILY 06/05/16  Yes Vincent Lukes, MD  Calcium 600 MG tablet Take 1 tablet (600 mg total) by  mouth 2 (two) times daily. 01/30/11  Yes Vincent Logan, MD  glipiZIDE (GLUCOTROL) 10 MG tablet TAKE 2 TABLETS BY MOUTH TWICE DAILY BEFORE MEALS 04/08/16  Yes Vincent Lukes, MD  JANUVIA 100 MG tablet TAKE 1/2 TO 1 TABLET DAILY Patient taking differently: TAKE 1 TABLET DAILY 12/22/15  Yes Vincent Lukes, MD  lisinopril (PRINIVIL,ZESTRIL) 5 MG tablet TAKE 1 TABLET BY MOUTH EVERY DAY 01/08/16  Yes Vincent Lukes, MD  metFORMIN (GLUCOPHAGE-XR) 500 MG 24 hr tablet Take 1 tablet (500 mg total) by mouth 2 (two) times daily. 01/24/16  Yes Vincent Lukes, MD  triamterene-hydrochlorothiazide (MAXZIDE) 75-50 MG  tablet TAKE 1 TABLET BY MOUTH DAILY. 07/08/16  Yes Vincent Lukes, MD  glucose blood (ONE TOUCH ULTRA TEST) test strip Use as twice daily to check blood sugar.  DX E11.9 11/07/15   Vincent Lukes, MD  Lancets [facility] ULTRASOFT) lancets Use as instructed to check blood sugar twice a day as needed Dx Code E11.9 11/07/15   Vincent Lukes, MD     Family History     Family History  Problem Relation Age of Onset  . Cancer Mother     colon, breast, pancreas, skin cancer  . Heart disease Father     heart valve replaced  . Stroke Father   . Diabetes Father   . Cancer Paternal Grandmother     colon  . Obesity Son   . Heart disease Son     bradycardia    Social History    Social History   Social History  . Marital status: Divorced    Spouse name: N/A  . Number of children: N/A  . Years of education: N/A   Occupational History  . Not on file.   Social History Main Topics  . Smoking status: Never Smoker  . Smokeless tobacco: Never Used  . Alcohol use 0.6 oz/week    1 Glasses of wine per week  . Drug use: No  . 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.   Other Topics Concern  . Not on file   Social History Narrative  . No narrative on file     Review of Systems    General:  No chills, fever, night sweats or weight changes.  Cardiovascular:  No chest pain, dyspnea on exertion, edema, orthopnea, palpitations, paroxysmal nocturnal dyspnea. Dermatological: No rash, lesions/masses Respiratory: No cough, dyspnea Urologic: No hematuria, dysuria Abdominal:   No nausea, vomiting, diarrhea, bright red blood per rectum, melena, or hematemesis Neurologic:  No visual changes, wkns, changes in mental status. All other systems reviewed and are otherwise negative except as noted above.  Physical Exam    Blood pressure 134/65, pulse (!) 43, temperature 97.5 F (36.4 C),  temperature source Oral, resp. rate 14, height 6' (1.829 m), weight 230 lb 8 oz (104.6 kg), SpO2 96 %.  General: Pleasant, NAD Psych: Normal affect. Neuro: Alert and oriented X 3. Moves all extremities spontaneously. HEENT: Normal  Neck: Supple without bruits, minimal JVD. Lungs:  Resp regular and unlabored, CTA. Heart: Irregular rhythm, regular rate, no murmurs. Abdomen: Soft, non-tender, non-distended, BS + x 4.  Extremities: No clubbing, cyanosis , 1+ edema. DP/PT/Radials 2+ and equal bilaterally.  Labs    Troponin (Point of Care Test) No results for input(s): TROPIPOC in the last 72 hours. No results for input(s): CKTOTAL,  CKMB, TROPONINI in the last 72 hours. Lab Results  Component Value Date   WBC 11.4 (H) 07/08/2016   HGB 12.9 (L) 07/08/2016   HCT 39.8 07/08/2016   MCV 85.6 07/08/2016   PLT 176 07/08/2016    Recent Labs Lab 07/09/16 0222  NA 139  K 4.0  CL 108  CO2 23  BUN 32*  CREATININE 1.70*  CALCIUM 8.9  GLUCOSE 113*   Lab Results  Component Value Date   CHOL 108 01/17/2016   HDL 36.70 (L) 01/17/2016   LDLCALC 48 01/17/2016   TRIG 118.0 01/17/2016   No results found for: [facility]   Radiology Studies    Dg Chest 2 View  Result Date: 07/08/2016 CLINICAL DATA:  83 y/o  M; fall today.  Chest pain. EXAM: CHEST  2 VIEW COMPARISON:  None. FINDINGS: Mild cardiomegaly. Aortic atherosclerosis with calcification. Right upper quadrant cholecystectomy clips. No focal consolidation. No pleural effusion or pneumothorax. Stable mild loss of height of lower thoracic vertebral bodies. No acute osseous abnormality identified. Right humerus rotator cuff repair postsurgical changes. IMPRESSION: Mild cardiomegaly.  No acute pulmonary process identified. Electronically Signed   By: Kristine Garbe M.D.   On: 07/08/2016 22:16   Ct Head Wo Contrast  Result Date: 07/08/2016 CLINICAL DATA:  83 year old male with fall. EXAM: CT HEAD WITHOUT CONTRAST CT CERVICAL SPINE  WITHOUT CONTRAST TECHNIQUE: Multidetector CT imaging of the head and cervical spine was performed following the standard protocol without intravenous contrast. Multiplanar CT image reconstructions of the cervical spine were also generated. COMPARISON:  Head CT dated 06/08/2014 FINDINGS: CT HEAD FINDINGS Brain: There is moderate age-related atrophy and chronic microvascular ischemic changes. There is no acute intracranial hemorrhage. No mass effect or midline shift noted. No extra-axial fluid collections. Vascular: No hyperdense vessel or unexpected calcification. Skull: Normal. Negative for fracture or focal lesion. Sinuses/Orbits: No acute finding. Other: None CT CERVICAL SPINE FINDINGS Alignment: No acute subluxation. There is grade 1 C2-C3 anterolisthesis which is similar or slightly progressed compared to the prior MRI. There is severe reversal of normal cervical lordosis centered at C2-C4 similar to the prior MRI. Skull base and vertebrae: No acute fracture. There multilevel degenerative changes with anterior osteophyte at C3-C4 and C4-C5. S there is multilevel endplate irregularity and disc space narrowing. Soft tissues and spinal canal: There is an moderate narrowing of the central canal with narrowing of the ventral subarachnoid space at C2-C4. No definite evidence of cord compression. No canal hematoma. Disc levels: Multilevel endplate irregularity and disc space narrowing. Mild posterior endplate osteophyte at Z6-X0 Upper chest: There is atherosclerotic calcification of the aortic arch. Other: None IMPRESSION: 1. No acute intracranial hemorrhage. 2. Moderate age-related atrophy and chronic microvascular ischemic changes. 3. No acute/traumatic cervical spine pathology. 4. Extensive degenerative changes of the cervical spine with kyphotic curvature at C2-C4 and grade 1 C2-C3 anterolisthesis as seen on the prior MRI. There is associated mild moderate narrowing of the central canal at these levels.  Electronically Signed   By: Anner Crete M.D.   On: 07/08/2016 22:16   Ct Cervical Spine Wo Contrast  Result Date: 07/08/2016 CLINICAL DATA:  83 year old male with fall. EXAM: CT HEAD WITHOUT CONTRAST CT CERVICAL SPINE WITHOUT CONTRAST TECHNIQUE: Multidetector CT imaging of the head and cervical spine was performed following the standard protocol without intravenous contrast. Multiplanar CT image reconstructions of the cervical spine were also generated. COMPARISON:  Head CT dated 06/08/2014 FINDINGS: CT HEAD FINDINGS Brain: There is moderate age-related atrophy  and chronic microvascular ischemic changes. There is no acute intracranial hemorrhage. No mass effect or midline shift noted. No extra-axial fluid collections. Vascular: No hyperdense vessel or unexpected calcification. Skull: Normal. Negative for fracture or focal lesion. Sinuses/Orbits: No acute finding. Other: None CT CERVICAL SPINE FINDINGS Alignment: No acute subluxation. There is grade 1 C2-C3 anterolisthesis which is similar or slightly progressed compared to the prior MRI. There is severe reversal of normal cervical lordosis centered at C2-C4 similar to the prior MRI. Skull base and vertebrae: No acute fracture. There multilevel degenerative changes with anterior osteophyte at C3-C4 and C4-C5. S there is multilevel endplate irregularity and disc space narrowing. Soft tissues and spinal canal: There is an moderate narrowing of the central canal with narrowing of the ventral subarachnoid space at C2-C4. No definite evidence of cord compression. No canal hematoma. Disc levels: Multilevel endplate irregularity and disc space narrowing. Mild posterior endplate osteophyte at S5-K5 Upper chest: There is atherosclerotic calcification of the aortic arch. Other: None IMPRESSION: 1. No acute intracranial hemorrhage. 2. Moderate age-related atrophy and chronic microvascular ischemic changes. 3. No acute/traumatic cervical spine pathology. 4. Extensive  degenerative changes of the cervical spine with kyphotic curvature at C2-C4 and grade 1 C2-C3 anterolisthesis as seen on the prior MRI. There is associated mild moderate narrowing of the central canal at these levels. Electronically Signed   By: Anner Crete M.D.   On: 07/08/2016 22:16   Dg Hand Complete Left  Result Date: 07/08/2016 CLINICAL DATA:  Pt fell at home x today, landed on both hands, left posterior hand pain over distal metacarpals EXAM: LEFT HAND - COMPLETE 3+ VIEW COMPARISON:  None. FINDINGS: Osteopenia. Peculiar lucency involving the dorsal aspect of the head of the third metacarpal with associated adjacent soft tissue swelling. Degenerative change of multiple intercarpal articulations. There is apparent widening involving the scapholunate articulation. Punctate ossicles are noted about the radiocarpal joint as well as the STT joints of the base of the thumb. Chondrocalcinosis within the TFCC. Degenerative change involving the first MTP joint with joint space loss, subchondral sclerosis and osteophytosis. There is a punctate (approximately 2 mm) radiopaque foreign body imbedded within the soft tissues about the dorsal aspect of the PIP joint of the fifth digit. Vascular calcifications. IMPRESSION: 1. And determine lucency involving the dorsal aspect of the head of the third metacarpal with associated soft tissue swelling. Nonspecific though could be seen in the setting of acute nondisplaced fracture. Correlation for point tenderness at this location is recommended. 2. Age-indeterminate punctate (approximately 2 mm) radiopaque foreign body imbedded within the soft tissues about the dorsal aspect of the PIP joint of the fifth digit. Clinical correlation is advised. 3. Widening of the scapholunate articulation suggestive of ligamentous injury. 4. Degenerative change of the hand as above. 5. Chondrocalcinosis within the TFCC suggestive of CPPD. Electronically Signed   By: Sandi Mariscal M.D.   On:  07/08/2016 17:23    ECG & Cardiac Imaging    EKG 07/09/16: Afib with LBBB, ventricular rate 41  Assessment & Plan    1. Atrial fibrillation with slow ventricular response and old LBBB - K and TSH WNL, Mg pending - EKG 05/20/14 in NSR with LBBB - home medications include ASA, norvasc, lisinopril, and lipitor - This patients CHA2DS2-VASc Score and unadjusted Ischemic Stroke Rate (% per year) is equal to 4.8 % stroke rate/year from a score of 4 (HTN, DM, age). Vincent Allen is currently in rate-controlled Afib per telemetry and is tolerating the rate  and rhythm well; he remains asymptomatic. This conversion to Afib is likely secondary to the stress of his recent fall. Previously, he experienced a transient junctional rhythm post-operatively following a cholecystectomy (2014). His current Afib may resolve as the stress of the fall and injuries improve. Will hold off on starting antiarrhythmic at this time given his slow ventricular response (in the 40-50s). Will discuss need for anticoagulation with attending. CHA2DS2/VAS of 4 would likely indicate need for anticoagulation. Will start eliquis dose adjusted to 2.5 mg BID.  Given his Afib and lower extremity edema, would consider echocardiogram. Will also set up for outpatient Holter monitor. He is not a candidate for pacemaker at this time.    2. CKD stage IIIb - sCr 1.70 (1.84); baseline appears to be near 1.50 (2017) - adequate urine output   3. HTN - norvasc and lisinopril   4. HLD - continue lipitor   5. DM - per primary team    Signed, Ledora Bottcher, PA-C 07/09/2016, 1:38 PM [phone]

## 2016-07-09 NOTE — Progress Notes (Signed)
ANTICOAGULATION CONSULT NOTE - Initial Consult  Pharmacy Consult for Eliquis Indication: atrial fibrillation  Allergies  Allergen Reactions  . Penicillins Hives and Itching  . Verapamil     Junctional rhythm  . Influenza Vaccines Rash    Patient Measurements: Height: 6' (182.9 cm) Weight: 230 lb 8 oz (104.6 kg) (b scale) IBW/kg (Calculated) : 77.6   Assessment: 83 yo M presents on 4/24. Found to have new onset Afib. CHADsVASC = 4. Cards consulting pharmacy to start Eliquis. Hgb 12.9, plts wnl. No s/s of bleed.  Goal of Therapy:  Monitor platelets by anticoagulation protocol: Yes   Plan:  Start Eliquis 2.5mg  PO BID Monitor CBC, s/s of bleed  Elenor Quinones, PharmD, [facility] Clinical Pharmacist Pager [phone] 07/09/2016 5:12 PM

## 2016-07-09 NOTE — Progress Notes (Signed)
I have seen and assessed the patient and agree with Dr. deficits assessment and plan. Patient is a 83 year old gentleman history of non-insulin-dependent diabetic diabetes mellitus, hypertension, chronic kidney disease stage III history of SDH who presented with a fall. Patient also noted to be significantly bradycardic with heart rate in the 40s and in A. fib with old left bundle branch block. Overnight on telemetry patient noted to have heart rates in the 30s to 40s. Patient not orthostatic. TSH 01/17/2016 was 2.48. Repeat TSH. Check a 2-D echo. Patient not on any beta blockers. Cardiology consultation pending.  No charge.

## 2016-07-09 NOTE — ED Notes (Signed)
Ortho tech at the bedside.  

## 2016-07-09 NOTE — Progress Notes (Signed)
Patient HR on the 30's-40's . PT asymptomatic. Patient asleep at this time. MD notified.

## 2016-07-10 ENCOUNTER — Other Ambulatory Visit: Payer: Self-pay | Admitting: Cardiology

## 2016-07-10 ENCOUNTER — Observation Stay (HOSPITAL_BASED_OUTPATIENT_CLINIC_OR_DEPARTMENT_OTHER): Payer: PPO

## 2016-07-10 DIAGNOSIS — E0822 Diabetes mellitus due to underlying condition with diabetic chronic kidney disease: Secondary | ICD-10-CM

## 2016-07-10 DIAGNOSIS — I4891 Unspecified atrial fibrillation: Secondary | ICD-10-CM

## 2016-07-10 DIAGNOSIS — I480 Paroxysmal atrial fibrillation: Secondary | ICD-10-CM

## 2016-07-10 DIAGNOSIS — I10 Essential (primary) hypertension: Secondary | ICD-10-CM | POA: Diagnosis not present

## 2016-07-10 DIAGNOSIS — N183 Chronic kidney disease, stage 3 (moderate): Secondary | ICD-10-CM | POA: Diagnosis not present

## 2016-07-10 DIAGNOSIS — E1122 Type 2 diabetes mellitus with diabetic chronic kidney disease: Secondary | ICD-10-CM | POA: Diagnosis not present

## 2016-07-10 DIAGNOSIS — R001 Bradycardia, unspecified: Secondary | ICD-10-CM

## 2016-07-10 LAB — BASIC METABOLIC PANEL
Anion gap: 8 (ref 5–15)
BUN: 29 mg/dL — AB (ref 6–20)
CO2: 22 mmol/L (ref 22–32)
Calcium: 8.5 mg/dL — ABNORMAL LOW (ref 8.9–10.3)
Chloride: 107 mmol/L (ref 101–111)
Creatinine, Ser: 1.5 mg/dL — ABNORMAL HIGH (ref 0.61–1.24)
GFR calc Af Amer: 49 mL/min — ABNORMAL LOW (ref >60)
GFR calc non Af Amer: 42 mL/min — ABNORMAL LOW (ref >60)
GLUCOSE: 148 mg/dL — AB (ref 65–99)
Potassium: 4 mmol/L (ref 3.5–5.1)
Sodium: 137 mmol/L (ref 135–145)

## 2016-07-10 LAB — CBC
HCT: 36.8 % — ABNORMAL LOW (ref 39.0–52.0)
Hemoglobin: 12.3 g/dL — ABNORMAL LOW (ref 13.0–17.0)
MCH: 28.2 pg (ref 26.0–34.0)
MCHC: 33.4 g/dL (ref 30.0–36.0)
MCV: 84.4 fL (ref 78.0–100.0)
Platelets: 169 10*3/uL (ref 150–400)
RBC: 4.36 MIL/uL (ref 4.22–5.81)
RDW: 14.7 % (ref 11.5–15.5)
WBC: 8.1 10*3/uL (ref 4.0–10.5)

## 2016-07-10 LAB — GLUCOSE, CAPILLARY
GLUCOSE-CAPILLARY: 136 mg/dL — AB (ref 65–99)
GLUCOSE-CAPILLARY: 152 mg/dL — AB (ref 65–99)

## 2016-07-10 LAB — ECHOCARDIOGRAM COMPLETE
Height: 72 in
Weight: 3686.4 oz

## 2016-07-10 LAB — MAGNESIUM: Magnesium: 2.3 mg/dL (ref 1.7–2.4)

## 2016-07-10 MED ORDER — TRAMADOL HCL 50 MG PO TABS: 50.0000 mg | ORAL_TABLET | Freq: Four times a day (QID) | ORAL | 0 refills | Status: DC | PRN

## 2016-07-10 MED ORDER — APIXABAN 2.5 MG PO TABS: 2.5000 mg | ORAL_TABLET | Freq: Two times a day (BID) | ORAL | 0 refills | Status: DC

## 2016-07-10 NOTE — Discharge Instructions (Signed)

## 2016-07-10 NOTE — Evaluation (Signed)
Physical Therapy Evaluation & Discharge Patient Details Name: Vincent Allen MRN: [id number] DOB: 1933-04-15 Today's Date: 07/10/2016   History of Present Illness  Mr. Cuevas is a 83 yo male with a PMH significant for HTN, HLD, GERD, DM, CKD stage III. He was brought to [facility] after falling at his house after using the restroom. He was subsequently found to be in rate-controlled Afib.   Clinical Impression  Patient presents with mobility close to his baseline.  Has had waddling gait since ankle injury years ago.  Advised to use cane in community to decrease fall risk and to allow daughter to check into life alert system.  Also educated on fall prevention.  No further skilled PT needs as likely d/c today and pt without follow up PT needs.     Follow Up Recommendations No PT follow up    Equipment Recommendations  None recommended by PT    Recommendations for Other Services       Precautions / Restrictions Precautions Precautions: Fall Required Braces or Orthoses: Other Brace/Splint;Sling (wasn't using sling) Other Brace/Splint: L UE splint to above elbow      Mobility  Bed Mobility               General bed mobility comments: up in bathroom upon my entry  Transfers Overall transfer level: Needs assistance   Transfers: Sit to/from Stand Sit to Stand: Supervision;Min guard         General transfer comment: from low recliner, increased time and effort, but able to stand on his own  Ambulation/Gait Ambulation/Gait assistance: Min guard;Supervision Ambulation Distance (Feet): 160 Feet (x 2) Assistive device: None;Straight cane Gait Pattern/deviations: Step-through pattern;Decreased stride length;Wide base of support;Shuffle     General Gait Details: increased lateral sway (waddling gait) pt reports is his baseline since years ago L ankle fx.  Second time with cane with some increased stability noted.  Stairs            Wheelchair Mobility    Modified  Rankin (Stroke Patients Only)       Balance Overall balance assessment: Needs assistance   Sitting balance-Leahy Scale: Good       Standing balance-Leahy Scale: Good Standing balance comment: stands to wash hands at sink, moves in room with S only                             Pertinent Vitals/Pain Pain Assessment: Faces Faces Pain Scale: Hurts little more Pain Location: L hand, fingers Pain Descriptors / Indicators: Sore Pain Intervention(s): Monitored during session;Repositioned    Home Living Family/patient expects to be discharged to:: Private residence Living Arrangements: Alone Available Help at Discharge: Family;Friend(s);Available PRN/intermittently Type of Home: Apartment Home Access: Level entry     Home Layout: One level Home Equipment: Cane - single point      Prior Function Level of Independence: Independent         Comments: has waddled since remote ankle fx.     Hand Dominance   Dominant Hand: Right    Extremity/Trunk Assessment   Upper Extremity Assessment Upper Extremity Assessment: LUE deficits/detail;RUE deficits/detail RUE Deficits / Details: limited shoulder flexion to approx 50 degrees AROM, about 70 AAROM (reports since attempted RCR) LUE Deficits / Details: wrist in cast but moves fingers and tumb well    Lower Extremity Assessment Lower Extremity Assessment: LLE deficits/detail LLE Deficits / Details: limited ankle AROM and edema present, reports since remote  ankle injury, limited knee flexion to about 100    Cervical / Trunk Assessment Cervical / Trunk Assessment: Kyphotic  Communication   Communication: HOH  Cognition Arousal/Alertness: Awake/alert Behavior During Therapy: WFL for tasks assessed/performed Overall Cognitive Status: Within Functional Limits for tasks assessed                                        General Comments General comments (skin integrity, edema, etc.): Friend in room he  is close to who also lives alone.  States they eat out a lot.  Education on fall prevention with patient for footwear, lighting, positioning of frequently used items, clear pathway and use of non skid surfaces in shower.  Patient also encouraged to consider life alert system and reports daughter already considering.     Exercises     Assessment/Plan    PT Assessment Patent does not need any further PT services  PT Problem List         PT Treatment Interventions      PT Goals (Current goals can be found in the Care Plan section)  Acute Rehab PT Goals PT Goal Formulation: All assessment and education complete, DC therapy    Frequency     Barriers to discharge        Co-evaluation               End of Session Equipment Utilized During Treatment: Gait belt Activity Tolerance: Patient tolerated treatment well Patient left: with call bell/phone within reach;with family/visitor present;in chair   PT Visit Diagnosis: Other abnormalities of gait and mobility (R26.89)    Time: 3300-7622 PT Time Calculation (min) (ACUTE ONLY): 32 min   Charges:   PT Evaluation $PT Eval Moderate Complexity: 1 Procedure PT Treatments $Gait Training: 8-22 mins   PT G Codes:   PT G-Codes **NOT FOR INPATIENT CLASS** Functional Assessment Tool Used: AM-PAC 6 Clicks Basic Mobility Functional Limitation: Mobility: Walking and moving around Mobility: Walking and Moving Around Current Status (Q3335): At least 20 percent but less than 40 percent impaired, limited or restricted Mobility: Walking and Moving Around Goal Status [phone]): At least 20 percent but less than 40 percent impaired, limited or restricted Mobility: Walking and Moving Around Discharge Status [phone]): At least 20 percent but less than 40 percent impaired, limited or restricted    Magda Kiel, [place] 07/10/2016   Reginia Naas 07/10/2016, 12:41 PM

## 2016-07-10 NOTE — Progress Notes (Signed)
Discharge instructions reviewed with patient and his daughter.  Both stated understanding.  Patient stated he has hearing aides at home and they were never in hospital (they were listed on belongings sheet).  Patient has upper and lower dentures in.  No voiced complaints.

## 2016-07-10 NOTE — Progress Notes (Signed)
DAILY PROGRESS NOTE   Patient Name: Vincent Allen Date of Encounter: 07/10/2016  Hospital Problem List   Principal Problem:   Bradycardia Active Problems:   Diabetes mellitus with chronic kidney disease ([place])   Chronic kidney disease stage III (GFR 30-59 ml/min)   Essential hypertension   Atrial fibrillation with slow ventricular response ([place])    Chief Complaint   Feels okay  Subjective   Rate-controlled a-fib overnight. Now on Eliquis. Echo shows LVEF 55%, moderate LAE and moderate pulmonary hypertension with mild MR.  Objective   Vitals:   07/09/16 1100 07/09/16 2129 07/10/16 0617 07/10/16 1042  BP: 134/65 (!) 135/59 137/63 (!) 143/62  Pulse: (!) 43 (!) 42 (!) 50 (!) 45  Resp: '14 17 18   ' Temp: 97.5 F (36.4 C) 98.2 F (36.8 C) 97.9 F (36.6 C)   TempSrc: Oral Oral Oral   SpO2: 96% 99% 98%   Weight:   230 lb 6.4 oz (104.5 kg)   Height:        Intake/Output Summary (Last 24 hours) at 07/10/16 1117 Last data filed at 07/10/16 0957  Gross per 24 hour  Intake              880 ml  Output              475 ml  Net              405 ml   Filed Weights   07/09/16 0157 07/10/16 0617  Weight: 230 lb 8 oz (104.6 kg) 230 lb 6.4 oz (104.5 kg)    Physical Exam   General appearance: alert and no distress Lungs: clear to auscultation bilaterally Heart: irregularly irregular rhythm Extremities: extremities normal, atraumatic, no cyanosis or edema and left forearm immobilized Neurologic: Grossly normal  Inpatient Medications    Scheduled Meds: . amLODipine  5 mg Oral Daily  . apixaban  2.5 mg Oral BID  . atorvastatin  20 mg Oral Daily  . insulin aspart  0-5 Units Subcutaneous QHS  . insulin aspart  0-9 Units Subcutaneous TID WC  . sodium chloride flush  3 mL Intravenous Q12H  . triamterene-hydrochlorothiazide  1 tablet Oral Daily    Continuous Infusions:   PRN Meds: acetaminophen **OR** acetaminophen, ondansetron **OR** ondansetron (ZOFRAN) IV,  traMADol   Labs   Results for orders placed or performed during the hospital encounter of 07/08/16 (from the past 48 hour(s))  Urinalysis, Routine w reflex microscopic     Status: Abnormal   Collection Time: 07/08/16  9:54 PM  Result Value Ref Range   Color, Urine STRAW (A) YELLOW   APPearance CLEAR CLEAR   Specific Gravity, Urine 1.011 1.005 - 1.030   pH 5.0 5.0 - 8.0   Glucose, UA NEGATIVE NEGATIVE mg/dL   Hgb urine dipstick NEGATIVE NEGATIVE   Bilirubin Urine NEGATIVE NEGATIVE   Ketones, ur NEGATIVE NEGATIVE mg/dL   Protein, ur NEGATIVE NEGATIVE mg/dL   Nitrite NEGATIVE NEGATIVE   Leukocytes, UA NEGATIVE NEGATIVE  Basic metabolic panel     Status: Abnormal   Collection Time: 07/08/16 10:17 PM  Result Value Ref Range   Sodium 137 135 - 145 mmol/L   Potassium 4.6 3.5 - 5.1 mmol/L   Chloride 106 101 - 111 mmol/L   CO2 21 (L) 22 - 32 mmol/L   Glucose, Bld 137 (H) 65 - 99 mg/dL   BUN 33 (H) 6 - 20 mg/dL   Creatinine, Ser 1.84 (H) 0.61 - 1.24 mg/dL  Calcium 9.0 8.9 - 10.3 mg/dL   GFR calc non Af Amer 33 (L) >60 mL/min   GFR calc Af Amer 38 (L) >60 mL/min    Comment: (NOTE) The eGFR has been calculated using the CKD EPI equation. This calculation has not been validated in all clinical situations. eGFR's persistently <60 mL/min signify possible Chronic Kidney Disease.    Anion gap 10 5 - 15  CBC with Differential     Status: Abnormal   Collection Time: 07/08/16 10:17 PM  Result Value Ref Range   WBC 11.4 (H) 4.0 - 10.5 K/uL   RBC 4.65 4.22 - 5.81 MIL/uL   Hemoglobin 12.9 (L) 13.0 - 17.0 g/dL   HCT 39.8 39.0 - 52.0 %   MCV 85.6 78.0 - 100.0 fL   MCH 27.7 26.0 - 34.0 pg   MCHC 32.4 30.0 - 36.0 g/dL   RDW 14.4 11.5 - 15.5 %   Platelets 176 150 - 400 K/uL   Neutrophils Relative % 73 %   Neutro Abs 8.3 (H) 1.7 - 7.7 K/uL   Lymphocytes Relative 16 %   Lymphs Abs 1.8 0.7 - 4.0 K/uL   Monocytes Relative 9 %   Monocytes Absolute 1.0 0.1 - 1.0 K/uL   Eosinophils Relative 2  %   Eosinophils Absolute 0.3 0.0 - 0.7 K/uL   Basophils Relative 0 %   Basophils Absolute 0.0 0.0 - 0.1 K/uL  Glucose, capillary     Status: Abnormal   Collection Time: 07/09/16  1:55 AM  Result Value Ref Range   Glucose-Capillary 109 (H) 65 - 99 mg/dL   Comment 1 Notify RN    Comment 2 Document in Chart   Basic metabolic panel     Status: Abnormal   Collection Time: 07/09/16  2:22 AM  Result Value Ref Range   Sodium 139 135 - 145 mmol/L   Potassium 4.0 3.5 - 5.1 mmol/L   Chloride 108 101 - 111 mmol/L   CO2 23 22 - 32 mmol/L   Glucose, Bld 113 (H) 65 - 99 mg/dL   BUN 32 (H) 6 - 20 mg/dL   Creatinine, Ser 1.70 (H) 0.61 - 1.24 mg/dL   Calcium 8.9 8.9 - 10.3 mg/dL   GFR calc non Af Amer 36 (L) >60 mL/min   GFR calc Af Amer 42 (L) >60 mL/min    Comment: (NOTE) The eGFR has been calculated using the CKD EPI equation. This calculation has not been validated in all clinical situations. eGFR's persistently <60 mL/min signify possible Chronic Kidney Disease.    Anion gap 8 5 - 15  Magnesium     Status: Abnormal   Collection Time: 07/09/16  5:00 AM  Result Value Ref Range   Magnesium 1.6 (L) 1.7 - 2.4 mg/dL  Glucose, capillary     Status: Abnormal   Collection Time: 07/09/16  8:22 AM  Result Value Ref Range   Glucose-Capillary 152 (H) 65 - 99 mg/dL  Glucose, capillary     Status: Abnormal   Collection Time: 07/09/16 11:10 AM  Result Value Ref Range   Glucose-Capillary 141 (H) 65 - 99 mg/dL  TSH     Status: None   Collection Time: 07/09/16 11:17 AM  Result Value Ref Range   TSH 3.198 0.350 - 4.500 uIU/mL    Comment: Performed by a 3rd Generation assay with a functional sensitivity of <=0.01 uIU/mL.  Glucose, capillary     Status: Abnormal   Collection Time: 07/09/16  3:57 PM  Result Value Ref Range   Glucose-Capillary 231 (H) 65 - 99 mg/dL  Glucose, capillary     Status: Abnormal   Collection Time: 07/09/16  9:28 PM  Result Value Ref Range   Glucose-Capillary 139 (H) 65 -  99 mg/dL   Comment 1 Notify RN    Comment 2 Document in Chart   CBC     Status: Abnormal   Collection Time: 07/10/16  6:01 AM  Result Value Ref Range   WBC 8.1 4.0 - 10.5 K/uL   RBC 4.36 4.22 - 5.81 MIL/uL   Hemoglobin 12.3 (L) 13.0 - 17.0 g/dL   HCT 36.8 (L) 39.0 - 52.0 %   MCV 84.4 78.0 - 100.0 fL   MCH 28.2 26.0 - 34.0 pg   MCHC 33.4 30.0 - 36.0 g/dL   RDW 14.7 11.5 - 15.5 %   Platelets 169 150 - 400 K/uL  Basic metabolic panel     Status: Abnormal   Collection Time: 07/10/16  6:01 AM  Result Value Ref Range   Sodium 137 135 - 145 mmol/L   Potassium 4.0 3.5 - 5.1 mmol/L   Chloride 107 101 - 111 mmol/L   CO2 22 22 - 32 mmol/L   Glucose, Bld 148 (H) 65 - 99 mg/dL   BUN 29 (H) 6 - 20 mg/dL   Creatinine, Ser 1.50 (H) 0.61 - 1.24 mg/dL   Calcium 8.5 (L) 8.9 - 10.3 mg/dL   GFR calc non Af Amer 42 (L) >60 mL/min   GFR calc Af Amer 49 (L) >60 mL/min    Comment: (NOTE) The eGFR has been calculated using the CKD EPI equation. This calculation has not been validated in all clinical situations. eGFR's persistently <60 mL/min signify possible Chronic Kidney Disease.    Anion gap 8 5 - 15  Magnesium     Status: None   Collection Time: 07/10/16  6:01 AM  Result Value Ref Range   Magnesium 2.3 1.7 - 2.4 mg/dL  Glucose, capillary     Status: Abnormal   Collection Time: 07/10/16  7:33 AM  Result Value Ref Range   Glucose-Capillary 136 (H) 65 - 99 mg/dL   Comment 1 Notify RN     ECG   A-fib with CVR - Personally Reviewed  Telemetry   A-fib in 60's - Personally Reviewed  Radiology    Dg Chest 2 View  Result Date: 07/08/2016 CLINICAL DATA:  83 y/o  M; fall today.  Chest pain. EXAM: CHEST  2 VIEW COMPARISON:  None. FINDINGS: Mild cardiomegaly. Aortic atherosclerosis with calcification. Right upper quadrant cholecystectomy clips. No focal consolidation. No pleural effusion or pneumothorax. Stable mild loss of height of lower thoracic vertebral bodies. No acute osseous  abnormality identified. Right humerus rotator cuff repair postsurgical changes. IMPRESSION: Mild cardiomegaly.  No acute pulmonary process identified. Electronically Signed   By: Kristine Garbe M.D.   On: 07/08/2016 22:16   Ct Head Wo Contrast  Result Date: 07/08/2016 CLINICAL DATA:  83 year old male with fall. EXAM: CT HEAD WITHOUT CONTRAST CT CERVICAL SPINE WITHOUT CONTRAST TECHNIQUE: Multidetector CT imaging of the head and cervical spine was performed following the standard protocol without intravenous contrast. Multiplanar CT image reconstructions of the cervical spine were also generated. COMPARISON:  Head CT dated 06/08/2014 FINDINGS: CT HEAD FINDINGS Brain: There is moderate age-related atrophy and chronic microvascular ischemic changes. There is no acute intracranial hemorrhage. No mass effect or midline shift noted. No extra-axial fluid collections. Vascular: No hyperdense vessel or  unexpected calcification. Skull: Normal. Negative for fracture or focal lesion. Sinuses/Orbits: No acute finding. Other: None CT CERVICAL SPINE FINDINGS Alignment: No acute subluxation. There is grade 1 C2-C3 anterolisthesis which is similar or slightly progressed compared to the prior MRI. There is severe reversal of normal cervical lordosis centered at C2-C4 similar to the prior MRI. Skull base and vertebrae: No acute fracture. There multilevel degenerative changes with anterior osteophyte at C3-C4 and C4-C5. S there is multilevel endplate irregularity and disc space narrowing. Soft tissues and spinal canal: There is an moderate narrowing of the central canal with narrowing of the ventral subarachnoid space at C2-C4. No definite evidence of cord compression. No canal hematoma. Disc levels: Multilevel endplate irregularity and disc space narrowing. Mild posterior endplate osteophyte at Z6-X0 Upper chest: There is atherosclerotic calcification of the aortic arch. Other: None IMPRESSION: 1. No acute intracranial  hemorrhage. 2. Moderate age-related atrophy and chronic microvascular ischemic changes. 3. No acute/traumatic cervical spine pathology. 4. Extensive degenerative changes of the cervical spine with kyphotic curvature at C2-C4 and grade 1 C2-C3 anterolisthesis as seen on the prior MRI. There is associated mild moderate narrowing of the central canal at these levels. Electronically Signed   By: Anner Crete M.D.   On: 07/08/2016 22:16   Ct Cervical Spine Wo Contrast  Result Date: 07/08/2016 CLINICAL DATA:  83 year old male with fall. EXAM: CT HEAD WITHOUT CONTRAST CT CERVICAL SPINE WITHOUT CONTRAST TECHNIQUE: Multidetector CT imaging of the head and cervical spine was performed following the standard protocol without intravenous contrast. Multiplanar CT image reconstructions of the cervical spine were also generated. COMPARISON:  Head CT dated 06/08/2014 FINDINGS: CT HEAD FINDINGS Brain: There is moderate age-related atrophy and chronic microvascular ischemic changes. There is no acute intracranial hemorrhage. No mass effect or midline shift noted. No extra-axial fluid collections. Vascular: No hyperdense vessel or unexpected calcification. Skull: Normal. Negative for fracture or focal lesion. Sinuses/Orbits: No acute finding. Other: None CT CERVICAL SPINE FINDINGS Alignment: No acute subluxation. There is grade 1 C2-C3 anterolisthesis which is similar or slightly progressed compared to the prior MRI. There is severe reversal of normal cervical lordosis centered at C2-C4 similar to the prior MRI. Skull base and vertebrae: No acute fracture. There multilevel degenerative changes with anterior osteophyte at C3-C4 and C4-C5. S there is multilevel endplate irregularity and disc space narrowing. Soft tissues and spinal canal: There is an moderate narrowing of the central canal with narrowing of the ventral subarachnoid space at C2-C4. No definite evidence of cord compression. No canal hematoma. Disc levels:  Multilevel endplate irregularity and disc space narrowing. Mild posterior endplate osteophyte at R6-E4 Upper chest: There is atherosclerotic calcification of the aortic arch. Other: None IMPRESSION: 1. No acute intracranial hemorrhage. 2. Moderate age-related atrophy and chronic microvascular ischemic changes. 3. No acute/traumatic cervical spine pathology. 4. Extensive degenerative changes of the cervical spine with kyphotic curvature at C2-C4 and grade 1 C2-C3 anterolisthesis as seen on the prior MRI. There is associated mild moderate narrowing of the central canal at these levels. Electronically Signed   By: Anner Crete M.D.   On: 07/08/2016 22:16   Dg Hand Complete Left  Result Date: 07/08/2016 CLINICAL DATA:  Pt fell at home x today, landed on both hands, left posterior hand pain over distal metacarpals EXAM: LEFT HAND - COMPLETE 3+ VIEW COMPARISON:  None. FINDINGS: Osteopenia. Peculiar lucency involving the dorsal aspect of the head of the third metacarpal with associated adjacent soft tissue swelling. Degenerative change of multiple  intercarpal articulations. There is apparent widening involving the scapholunate articulation. Punctate ossicles are noted about the radiocarpal joint as well as the STT joints of the base of the thumb. Chondrocalcinosis within the TFCC. Degenerative change involving the first MTP joint with joint space loss, subchondral sclerosis and osteophytosis. There is a punctate (approximately 2 mm) radiopaque foreign body imbedded within the soft tissues about the dorsal aspect of the PIP joint of the fifth digit. Vascular calcifications. IMPRESSION: 1. And determine lucency involving the dorsal aspect of the head of the third metacarpal with associated soft tissue swelling. Nonspecific though could be seen in the setting of acute nondisplaced fracture. Correlation for point tenderness at this location is recommended. 2. Age-indeterminate punctate (approximately 2 mm) radiopaque  foreign body imbedded within the soft tissues about the dorsal aspect of the PIP joint of the fifth digit. Clinical correlation is advised. 3. Widening of the scapholunate articulation suggestive of ligamentous injury. 4. Degenerative change of the hand as above. 5. Chondrocalcinosis within the TFCC suggestive of CPPD. Electronically Signed   By: Sandi Mariscal M.D.   On: 07/08/2016 17:23    Cardiac Studies   LV EF: 55%  ------------------------------------------------------------------- Indications:      Atrial fibrillation - chronic 427.31.  ------------------------------------------------------------------- History:   Risk factors:  Hypertension. Diabetes mellitus. Dyslipidemia.  ------------------------------------------------------------------- Study Conclusions  - Left ventricle: The cavity size was normal. Wall thickness was   normal. The estimated ejection fraction was 55%. Indeterminant   diastolic function (atrial fibrillation). Although no diagnostic   regional wall motion abnormality was identified, this possibility   cannot be completely excluded on the basis of this study. - Aortic valve: There was no stenosis. - Mitral valve: Mildly calcified annulus. Mildly calcified leaflets   . There was mild regurgitation. - Left atrium: The atrium was moderately dilated. - Right ventricle: The cavity size was mildly dilated. Systolic   function was normal. - Tricuspid valve: Peak RV-RA gradient (S): 46 mm Hg. - Pulmonary arteries: PA peak pressure: 54 mm Hg (S). - Systemic veins: IVC measured 2.1 cm with > 50% respirophasic   variation, suggesting RA pressure 8 mmHg.  Impressions:  - The patient was in atrial fibrillation. Normal LV size with EF   55%. Mildly dilated RV with normal systolic function. Moderate   pulmonary hypertension. Mild MR.  Assessment   1. Principal Problem: 2.   Bradycardia 3. Active Problems: 4.   Diabetes mellitus with chronic kidney disease  ([place]) 5.   Chronic kidney disease stage III (GFR 30-59 ml/min) 6.   Essential hypertension 7.   Atrial fibrillation with slow ventricular response ([facility]) 8.   Plan   1. HR improved with a-fib. We will arrange for outpatient monitor. I updated his daughter of our recommendations. Should remain on uninterrupted Eliquis 2.5 mg BID - will consider DCCV after 3 weeks of anticoagulation if he remains fatigued/symptomatic. No indication for pacer at this time. 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  Nazlini for d/c home today from my standpoint.  Time Spent Directly with Patient:  15 minutes  Length of Stay:  LOS: 0 days   Pixie Casino, MD, [place]  Attending Cardiologist  Direct Dial: [phone]  Fax: [phone]  Website:  [email or web address] 07/10/2016, 11:17 AM

## 2016-07-10 NOTE — Progress Notes (Deleted)
Pt converted to A fib with RVR up to the 160s as pt started getting up to the bedside commode. Heart rate remained elevated for awhile after pt returned to bed for rest. PO Metoprolol given. Pt converted back to NSR in the 80s-90s. Will continue to monitor.

## 2016-07-10 NOTE — Progress Notes (Signed)
Dr. Carles Collet and Dr. Debara Pickett in to see patient today.  2D Echo completed and Dr. Debara Pickett stated he will review results once and give recommendation.  Pending echo results patient may be discharged to home.  Dr. Debara Pickett gave update to patient's daughter via telephone.Patient updated on plan of care.  Pt consult completed.

## 2016-07-10 NOTE — Progress Notes (Signed)
  Echocardiogram 2D Echocardiogram has been performed.  Vincent Allen [name] 07/10/2016, 10:29 AM

## 2016-07-10 NOTE — Discharge Summary (Signed)
Physician Discharge Summary  Vincent Allen ZOX:[id number] DOB: [birth year] DOA: 07/08/2016  PCP: [name], MD  Admit date: 07/08/2016 Discharge date: 07/10/2016  Admitted From: Home Disposition:  Home / SNF  Recommendations for Outpatient Follow-up:  1. Follow up with PCP in 1-2 weeks 2. Follow up with Dr. Durward Fortes at [facility] in one week 3. Follow up with cardiology, Dr. Debara Pickett in 1-2 weeks  Home Health: Equipment/Devices:  Discharge Condition: Stable CODE STATUS: FULL Diet recommendation: Heart Healthy   Brief/Interim Summary:  83 y.o. male with a past medical history significant for NIDDM, HTN, CKD III and hx of SDH who presents with fall.  The patient was in his usual state of health until this afternoon he was walking through the garage in his house when he "fell". He had no lightheadedness, no feeling of warmth, dizziness, "wooziness", or other prodrome.  He had no chest pain or palpitations or SOB.  He denies losing consciousness, but also denies mechanical fall and leg weakness and states "I just fell".  The patient was noted to have bradycardia with HR in low 40s and old LBBB.  Cardiology was consulted to assist.  Discharge Diagnoses:   symptomatic bradycardia:  Patient denies LOC, but what he is describing sounds like syncope.  Currently hemodynamically stable.  On no rate controlling medicines.   -Monitor on telemetry -Consult to cardiology--out pt event monitor; not candidate for PPM presently  Atrial fibrillation  -Patient has a history of postoperative A. fib -CHADS2-VASc 4 (for age, diabetes, HTN).  -had brief episode of RVR during hospitalization  -started on apixaban 2.5 mg bid -Echo EF 55%, no WMA, PASP 54 -avoid flying until cleared by cardiology  Chronic kidney disease stage 3  -Baseline creatinine 1.4-1.7 -stable  Non-insulin-dependent diabetes:  -Hold orals during hospitalization -SSI with meals during the  hospitalization -Patient will need more frequent monitoring of his renal function as he continues on metformin in the outpatient setting  Hypertension:  -Holding ACEi given CKD and risk for AKI -Continue HCTZ, triamterene and amlodipine  Left Arm injury -07/08/2016 x-ray left arm--age indeterminate lucency involving dorsal aspect of the head of the third metacarpal associated with soft tissue swelling; age indeterminant radiopaque foreign body in the soft tissues about the dorsal aspect of the PIP joint of the fifth digit; widening of the scapholunate articulation suggestive of ligamentous injury -follow up with Dr. Durward Fortes after d/c   Discharge Instructions  Discharge Instructions    Diet - low sodium heart healthy    Complete by:  As directed    Increase activity slowly    Complete by:  As directed      Allergies as of 07/10/2016      Reactions   Penicillins Hives, Itching   Verapamil    Junctional rhythm   Influenza Vaccines Rash      Medication List    STOP taking these medications   lisinopril 5 MG tablet Commonly known as:  PRINIVIL,ZESTRIL     TAKE these medications   amLODipine 5 MG tablet Commonly known as:  NORVASC TAKE 1 TABLET BY MOUTH EVERY DAY   apixaban 2.5 MG Tabs tablet Commonly known as:  ELIQUIS Take 1 tablet (2.5 mg total) by mouth 2 (two) times daily.   atorvastatin 20 MG tablet Commonly known as:  LIPITOR TAKE 1 TABLET BY MOUTH DAILY   calcium carbonate 600 MG tablet Commonly known as:  OS-CAL Take 1 tablet (600 mg total) by mouth 2 (two) times daily.  glipiZIDE 10 MG tablet Commonly known as:  GLUCOTROL TAKE 2 TABLETS BY MOUTH TWICE DAILY BEFORE MEALS   glucose blood test strip Commonly known as:  ONE TOUCH ULTRA TEST Use as twice daily to check blood sugar.  DX E11.9   JANUVIA 100 MG tablet Generic drug:  sitaGLIPtin TAKE 1/2 TO 1 TABLET DAILY What changed:  See the new instructions.   metFORMIN 500 MG 24 hr  tablet Commonly known as:  GLUCOPHAGE-XR Take 1 tablet (500 mg total) by mouth 2 (two) times daily.   onetouch ultrasoft lancets Use as instructed to check blood sugar twice a day as needed Dx Code E11.9   traMADol 50 MG tablet Commonly known as:  ULTRAM Take 1 tablet (50 mg total) by mouth every 6 (six) hours as needed for moderate pain.   triamterene-hydrochlorothiazide 75-50 MG tablet Commonly known as:  MAXZIDE TAKE 1 TABLET BY MOUTH DAILY.      Follow-up Information    [name], MD Follow up in 1 week(s).   Specialty:  Orthopedic Surgery Contact information: [place] [address] [phone]        Pixie Casino, MD Follow up.   Specialty:  Cardiology Why:  Dr Lysbeth Penner office will conatct you about the monitor and a follow up visit Contact information: [place] [address] [phone]          Allergies  Allergen Reactions  . Penicillins Hives and Itching  . Verapamil     Junctional rhythm  . Influenza Vaccines Rash    Consultations: Cardiology   Procedures/Studies: Dg Chest 2 View  Result Date: 07/08/2016 CLINICAL DATA:  83 y/o  M; fall today.  Chest pain. EXAM: CHEST  2 VIEW COMPARISON:  None. FINDINGS: Mild cardiomegaly. Aortic atherosclerosis with calcification. Right upper quadrant cholecystectomy clips. No focal consolidation. No pleural effusion or pneumothorax. Stable mild loss of height of lower thoracic vertebral bodies. No acute osseous abnormality identified. Right humerus rotator cuff repair postsurgical changes. IMPRESSION: Mild cardiomegaly.  No acute pulmonary process identified. Electronically Signed   By: Kristine Garbe M.D.   On: 07/08/2016 22:16   Ct Head Wo Contrast  Result Date: 07/08/2016 CLINICAL DATA:  83 year old male with fall. EXAM: CT HEAD WITHOUT CONTRAST CT CERVICAL SPINE WITHOUT CONTRAST TECHNIQUE: Multidetector CT imaging of the head and cervical  spine was performed following the standard protocol without intravenous contrast. Multiplanar CT image reconstructions of the cervical spine were also generated. COMPARISON:  Head CT dated 06/08/2014 FINDINGS: CT HEAD FINDINGS Brain: There is moderate age-related atrophy and chronic microvascular ischemic changes. There is no acute intracranial hemorrhage. No mass effect or midline shift noted. No extra-axial fluid collections. Vascular: No hyperdense vessel or unexpected calcification. Skull: Normal. Negative for fracture or focal lesion. Sinuses/Orbits: No acute finding. Other: None CT CERVICAL SPINE FINDINGS Alignment: No acute subluxation. There is grade 1 C2-C3 anterolisthesis which is similar or slightly progressed compared to the prior MRI. There is severe reversal of normal cervical lordosis centered at C2-C4 similar to the prior MRI. Skull base and vertebrae: No acute fracture. There multilevel degenerative changes with anterior osteophyte at C3-C4 and C4-C5. S there is multilevel endplate irregularity and disc space narrowing. Soft tissues and spinal canal: There is an moderate narrowing of the central canal with narrowing of the ventral subarachnoid space at C2-C4. No definite evidence of cord compression. No canal hematoma. Disc levels: Multilevel endplate irregularity and disc space narrowing. Mild posterior endplate  osteophyte at C3-C4 Upper chest: There is atherosclerotic calcification of the aortic arch. Other: None IMPRESSION: 1. No acute intracranial hemorrhage. 2. Moderate age-related atrophy and chronic microvascular ischemic changes. 3. No acute/traumatic cervical spine pathology. 4. Extensive degenerative changes of the cervical spine with kyphotic curvature at C2-C4 and grade 1 C2-C3 anterolisthesis as seen on the prior MRI. There is associated mild moderate narrowing of the central canal at these levels. Electronically Signed   By: Anner Crete M.D.   On: 07/08/2016 22:16   Ct Cervical  Spine Wo Contrast  Result Date: 07/08/2016 CLINICAL DATA:  83 year old male with fall. EXAM: CT HEAD WITHOUT CONTRAST CT CERVICAL SPINE WITHOUT CONTRAST TECHNIQUE: Multidetector CT imaging of the head and cervical spine was performed following the standard protocol without intravenous contrast. Multiplanar CT image reconstructions of the cervical spine were also generated. COMPARISON:  Head CT dated 06/08/2014 FINDINGS: CT HEAD FINDINGS Brain: There is moderate age-related atrophy and chronic microvascular ischemic changes. There is no acute intracranial hemorrhage. No mass effect or midline shift noted. No extra-axial fluid collections. Vascular: No hyperdense vessel or unexpected calcification. Skull: Normal. Negative for fracture or focal lesion. Sinuses/Orbits: No acute finding. Other: None CT CERVICAL SPINE FINDINGS Alignment: No acute subluxation. There is grade 1 C2-C3 anterolisthesis which is similar or slightly progressed compared to the prior MRI. There is severe reversal of normal cervical lordosis centered at C2-C4 similar to the prior MRI. Skull base and vertebrae: No acute fracture. There multilevel degenerative changes with anterior osteophyte at C3-C4 and C4-C5. S there is multilevel endplate irregularity and disc space narrowing. Soft tissues and spinal canal: There is an moderate narrowing of the central canal with narrowing of the ventral subarachnoid space at C2-C4. No definite evidence of cord compression. No canal hematoma. Disc levels: Multilevel endplate irregularity and disc space narrowing. Mild posterior endplate osteophyte at D4-Y8 Upper chest: There is atherosclerotic calcification of the aortic arch. Other: None IMPRESSION: 1. No acute intracranial hemorrhage. 2. Moderate age-related atrophy and chronic microvascular ischemic changes. 3. No acute/traumatic cervical spine pathology. 4. Extensive degenerative changes of the cervical spine with kyphotic curvature at C2-C4 and grade 1  C2-C3 anterolisthesis as seen on the prior MRI. There is associated mild moderate narrowing of the central canal at these levels. Electronically Signed   By: Anner Crete M.D.   On: 07/08/2016 22:16   Dg Hand Complete Left  Result Date: 07/08/2016 CLINICAL DATA:  Pt fell at home x today, landed on both hands, left posterior hand pain over distal metacarpals EXAM: LEFT HAND - COMPLETE 3+ VIEW COMPARISON:  None. FINDINGS: Osteopenia. Peculiar lucency involving the dorsal aspect of the head of the third metacarpal with associated adjacent soft tissue swelling. Degenerative change of multiple intercarpal articulations. There is apparent widening involving the scapholunate articulation. Punctate ossicles are noted about the radiocarpal joint as well as the STT joints of the base of the thumb. Chondrocalcinosis within the TFCC. Degenerative change involving the first MTP joint with joint space loss, subchondral sclerosis and osteophytosis. There is a punctate (approximately 2 mm) radiopaque foreign body imbedded within the soft tissues about the dorsal aspect of the PIP joint of the fifth digit. Vascular calcifications. IMPRESSION: 1. And determine lucency involving the dorsal aspect of the head of the third metacarpal with associated soft tissue swelling. Nonspecific though could be seen in the setting of acute nondisplaced fracture. Correlation for point tenderness at this location is recommended. 2. Age-indeterminate punctate (approximately 2 mm) radiopaque foreign body  imbedded within the soft tissues about the dorsal aspect of the PIP joint of the fifth digit. Clinical correlation is advised. 3. Widening of the scapholunate articulation suggestive of ligamentous injury. 4. Degenerative change of the hand as above. 5. Chondrocalcinosis within the TFCC suggestive of CPPD. Electronically Signed   By: Sandi Mariscal M.D.   On: 07/08/2016 17:23        Discharge Exam: Vitals:   07/10/16 1042 07/10/16 1151   BP: (!) 143/62 (!) 142/69  Pulse: (!) 45 92  Resp:  20  Temp:  97.5 F (36.4 C)   Vitals:   07/09/16 2129 07/10/16 0617 07/10/16 1042 07/10/16 1151  BP: (!) 135/59 137/63 (!) 143/62 (!) 142/69  Pulse: (!) 42 (!) 50 (!) 45 92  Resp: 17 18  20   Temp: 98.2 F (36.8 C) 97.9 F (36.6 C)  97.5 F (36.4 C)  TempSrc: Oral Oral  Oral  SpO2: 99% 98%  98%  Weight:  104.5 kg (230 lb 6.4 oz)    Height:        General: Pt is alert, awake, not in acute distress Cardiovascular: RRR, S1/S2 +, no rubs, no gallops Respiratory: CTA bilaterally, no wheezing, no rhonchi Abdominal: Soft, NT, ND, bowel sounds + Extremities: no edema, no cyanosis   The results of significant diagnostics from this hospitalization (including imaging, microbiology, ancillary and laboratory) are listed below for reference.    Significant Diagnostic Studies: Dg Chest 2 View  Result Date: 07/08/2016 CLINICAL DATA:  83 y/o  M; fall today.  Chest pain. EXAM: CHEST  2 VIEW COMPARISON:  None. FINDINGS: Mild cardiomegaly. Aortic atherosclerosis with calcification. Right upper quadrant cholecystectomy clips. No focal consolidation. No pleural effusion or pneumothorax. Stable mild loss of height of lower thoracic vertebral bodies. No acute osseous abnormality identified. Right humerus rotator cuff repair postsurgical changes. IMPRESSION: Mild cardiomegaly.  No acute pulmonary process identified. Electronically Signed   By: Kristine Garbe M.D.   On: 07/08/2016 22:16   Ct Head Wo Contrast  Result Date: 07/08/2016 CLINICAL DATA:  83 year old male with fall. EXAM: CT HEAD WITHOUT CONTRAST CT CERVICAL SPINE WITHOUT CONTRAST TECHNIQUE: Multidetector CT imaging of the head and cervical spine was performed following the standard protocol without intravenous contrast. Multiplanar CT image reconstructions of the cervical spine were also generated. COMPARISON:  Head CT dated 06/08/2014 FINDINGS: CT HEAD FINDINGS Brain: There is  moderate age-related atrophy and chronic microvascular ischemic changes. There is no acute intracranial hemorrhage. No mass effect or midline shift noted. No extra-axial fluid collections. Vascular: No hyperdense vessel or unexpected calcification. Skull: Normal. Negative for fracture or focal lesion. Sinuses/Orbits: No acute finding. Other: None CT CERVICAL SPINE FINDINGS Alignment: No acute subluxation. There is grade 1 C2-C3 anterolisthesis which is similar or slightly progressed compared to the prior MRI. There is severe reversal of normal cervical lordosis centered at C2-C4 similar to the prior MRI. Skull base and vertebrae: No acute fracture. There multilevel degenerative changes with anterior osteophyte at C3-C4 and C4-C5. S there is multilevel endplate irregularity and disc space narrowing. Soft tissues and spinal canal: There is an moderate narrowing of the central canal with narrowing of the ventral subarachnoid space at C2-C4. No definite evidence of cord compression. No canal hematoma. Disc levels: Multilevel endplate irregularity and disc space narrowing. Mild posterior endplate osteophyte at W1-X9 Upper chest: There is atherosclerotic calcification of the aortic arch. Other: None IMPRESSION: 1. No acute intracranial hemorrhage. 2. Moderate age-related atrophy and chronic microvascular ischemic changes.  3. No acute/traumatic cervical spine pathology. 4. Extensive degenerative changes of the cervical spine with kyphotic curvature at C2-C4 and grade 1 C2-C3 anterolisthesis as seen on the prior MRI. There is associated mild moderate narrowing of the central canal at these levels. Electronically Signed   By: Anner Crete M.D.   On: 07/08/2016 22:16   Ct Cervical Spine Wo Contrast  Result Date: 07/08/2016 CLINICAL DATA:  83 year old male with fall. EXAM: CT HEAD WITHOUT CONTRAST CT CERVICAL SPINE WITHOUT CONTRAST TECHNIQUE: Multidetector CT imaging of the head and cervical spine was performed  following the standard protocol without intravenous contrast. Multiplanar CT image reconstructions of the cervical spine were also generated. COMPARISON:  Head CT dated 06/08/2014 FINDINGS: CT HEAD FINDINGS Brain: There is moderate age-related atrophy and chronic microvascular ischemic changes. There is no acute intracranial hemorrhage. No mass effect or midline shift noted. No extra-axial fluid collections. Vascular: No hyperdense vessel or unexpected calcification. Skull: Normal. Negative for fracture or focal lesion. Sinuses/Orbits: No acute finding. Other: None CT CERVICAL SPINE FINDINGS Alignment: No acute subluxation. There is grade 1 C2-C3 anterolisthesis which is similar or slightly progressed compared to the prior MRI. There is severe reversal of normal cervical lordosis centered at C2-C4 similar to the prior MRI. Skull base and vertebrae: No acute fracture. There multilevel degenerative changes with anterior osteophyte at C3-C4 and C4-C5. S there is multilevel endplate irregularity and disc space narrowing. Soft tissues and spinal canal: There is an moderate narrowing of the central canal with narrowing of the ventral subarachnoid space at C2-C4. No definite evidence of cord compression. No canal hematoma. Disc levels: Multilevel endplate irregularity and disc space narrowing. Mild posterior endplate osteophyte at W0-J8 Upper chest: There is atherosclerotic calcification of the aortic arch. Other: None IMPRESSION: 1. No acute intracranial hemorrhage. 2. Moderate age-related atrophy and chronic microvascular ischemic changes. 3. No acute/traumatic cervical spine pathology. 4. Extensive degenerative changes of the cervical spine with kyphotic curvature at C2-C4 and grade 1 C2-C3 anterolisthesis as seen on the prior MRI. There is associated mild moderate narrowing of the central canal at these levels. Electronically Signed   By: Anner Crete M.D.   On: 07/08/2016 22:16   Dg Hand Complete Left  Result  Date: 07/08/2016 CLINICAL DATA:  Pt fell at home x today, landed on both hands, left posterior hand pain over distal metacarpals EXAM: LEFT HAND - COMPLETE 3+ VIEW COMPARISON:  None. FINDINGS: Osteopenia. Peculiar lucency involving the dorsal aspect of the head of the third metacarpal with associated adjacent soft tissue swelling. Degenerative change of multiple intercarpal articulations. There is apparent widening involving the scapholunate articulation. Punctate ossicles are noted about the radiocarpal joint as well as the STT joints of the base of the thumb. Chondrocalcinosis within the TFCC. Degenerative change involving the first MTP joint with joint space loss, subchondral sclerosis and osteophytosis. There is a punctate (approximately 2 mm) radiopaque foreign body imbedded within the soft tissues about the dorsal aspect of the PIP joint of the fifth digit. Vascular calcifications. IMPRESSION: 1. And determine lucency involving the dorsal aspect of the head of the third metacarpal with associated soft tissue swelling. Nonspecific though could be seen in the setting of acute nondisplaced fracture. Correlation for point tenderness at this location is recommended. 2. Age-indeterminate punctate (approximately 2 mm) radiopaque foreign body imbedded within the soft tissues about the dorsal aspect of the PIP joint of the fifth digit. Clinical correlation is advised. 3. Widening of the scapholunate articulation suggestive of ligamentous  injury. 4. Degenerative change of the hand as above. 5. Chondrocalcinosis within the TFCC suggestive of CPPD. Electronically Signed   By: Sandi Mariscal M.D.   On: 07/08/2016 17:23     Microbiology: No results found for this or any previous visit (from the past 240 hour(s)).   Labs: Basic Metabolic Panel:  Recent Labs Lab 07/08/16 2217 07/09/16 0222 07/09/16 0500 07/10/16 0601  NA 137 139  --  137  K 4.6 4.0  --  4.0  CL 106 108  --  107  CO2 21* 23  --  22  GLUCOSE  137* 113*  --  148*  BUN 33* 32*  --  29*  CREATININE 1.84* 1.70*  --  1.50*  CALCIUM 9.0 8.9  --  8.5*  MG  --   --  1.6* 2.3   Liver Function Tests: No results for input(s): AST, ALT, ALKPHOS, BILITOT, PROT, ALBUMIN in the last 168 hours. No results for input(s): LIPASE, AMYLASE in the last 168 hours. No results for input(s): AMMONIA in the last 168 hours. CBC:  Recent Labs Lab 07/08/16 2217 07/10/16 0601  WBC 11.4* 8.1  NEUTROABS 8.3*  --   HGB 12.9* 12.3*  HCT 39.8 36.8*  MCV 85.6 84.4  PLT 176 169   Cardiac Enzymes: No results for input(s): CKTOTAL, CKMB, CKMBINDEX, TROPONINI in the last 168 hours. BNP: Invalid input(s): POCBNP CBG:  Recent Labs Lab 07/09/16 1110 07/09/16 1557 07/09/16 2128 07/10/16 0733 07/10/16 1128  GLUCAP 141* 231* 139* 136* 152*    Time coordinating discharge:  Greater than 30 minutes  Signed:  Kayon Dozier, DO [facility] Pager: [phone] 07/10/2016, 1:24 PM

## 2016-07-11 ENCOUNTER — Ambulatory Visit (INDEPENDENT_AMBULATORY_CARE_PROVIDER_SITE_OTHER): Payer: PPO | Admitting: Orthopaedic Surgery

## 2016-07-11 ENCOUNTER — Telehealth: Payer: Self-pay

## 2016-07-11 ENCOUNTER — Encounter (INDEPENDENT_AMBULATORY_CARE_PROVIDER_SITE_OTHER): Payer: Self-pay | Admitting: Orthopaedic Surgery

## 2016-07-11 VITALS — BP 165/68 | HR 65 | Resp 14 | Ht 72.0 in | Wt 228.0 lb

## 2016-07-11 DIAGNOSIS — M79642 Pain in left hand: Secondary | ICD-10-CM

## 2016-07-11 NOTE — Telephone Encounter (Signed)
07/11/16  Hospital Follow Up  Transition Care Management Follow-up Telephone Call  ADMISSION DATE: 07/08/16   DISCHARGE DATE: 07/10/16  How have you been since you were released from the hospital? Patient states he still has pain in L arm from fall and has had fatigue due to not sleeping well.  Do you understand why you were in the hospital? YES   Do you understand the discharge instrcutions? Yes  Items Reviewed:  Medications reviewed: States daughter has medication list unable to review meds with patient.  Allergies reviewed: No changes in medication allergies  Dietary changes reviewed:Low Sodium Heart Healthy  Referrals reviewed: PCP-07/25/16, Cardiology today at 1:30,Cardiology appointment to be scheduled by daughter.   Functional Questionnaire:   Activities of Daily Living (ADLs):  Patient needs assistance at this time with ADL's due to arm being in cast.   Any transportation issues/concerns?: No,family will transport   Any patient concerns? Urine needs to be checked due to urgent message he received. Appointment scheduled with lab for 07/15/16              Confirmed  impotance and date/time of follow-up visits scheduled: Yes   Confirmed with patient if condition begins to worsen call PCP or go to the ER. Yes    Patient was given the Call-a-Nurse line [phone]: Patient states he has nothing to write with at this time. Advised to call back and I will give him phone number. Patient agreed.

## 2016-07-11 NOTE — Progress Notes (Signed)
Office Visit Note   Patient: Vincent Allen           Date of Birth: 01/19/1934           MRN: [id number] Visit Date: 07/11/2016              Requested by: [name], MD [address] [place], [place] [address] PCP: [name], MD   Assessment & Plan: Visit Diagnoses:  1. Pain of left hand   Several abrasions to the dorsum of the left hand without obvious fracture. Diffuse osteoarthritis  Plan: Acute injuries included abrasions of the skin of the dorsum of his left hand. I will clean these with new mupropicin and a new bandage and encourage range of motion exercises. I am afraid that if he is immobilized that he will get very stiff given the significant arthritic changes by plain film plan we'll see him back on Monday   Follow-Up Instructions: Return Monday.   Orders:  No orders of the defined types were placed in this encounter.  No orders of the defined types were placed in this encounter.     Procedures: No procedures performed   Clinical Data: No additional findings.   Subjective: Chief Complaint  Patient presents with  . Left Hand - Injury    Vincent Allen is an 83 y o present with an ED visit on 07/08/16 when  was walking through house when he "fell". He had no lightheadedness, no feeling of warmth, dizziness, "wooziness",   He had no chest pain or palpitations or SOB.  He denies losing consci  Vincent Allen fell on Monday i.e. 3 days ago landing on the left hand. He was seen in the emergency room with questionable fracture by a plain film. He also had several skin abrasions that were addressed and a sugar tong splint applied. He is here for follow-up evaluation. In the interim he's been placed on Eliquis for atrial fibrillation he notes that he is relatively comfortable except for the fact is unable to move his hand. I reviewed his films and the PACS system and do not see an obvious fracture. He has diffuse degenerative changes throughout the  carpus and at the thumb metacarpal phalangeal joint. There are other areas of cystic change in the metacarpal heads and some ectopic calcification probably consistent with CPPD.  HPI  Review of Systems   Objective: Vital Signs: BP (!) 165/68   Pulse 65   Resp 14   Ht 6' (1.829 m)   Wt 228 lb (103.4 kg)   BMI 30.92 kg/m   Physical Exam  Ortho Exam left hand with diffuse areas of ecchymosis. The ecchymosis extends along the forearm to the elbow Vincent Allen was on aspirin prior to the Eliquis . Several areas of skin abrasion and skin flaps are quite superficial at the dorsum of the long and ring metacarpals. No exposed deep structures. Good sensibility of fingers. No distal radius or ulna pain or swelling. Unable to make a full fist based on the mild swelling and "stiffness". No obvious deformity.   Imaging: No results found.   PMFS History: Patient Active Problem List   Diagnosis Date Noted  . Bradycardia 07/09/2016  . Atrial fibrillation with slow ventricular response ([facility]) 07/09/2016  . Vitamin D deficiency 07/17/2015  . Preventative health care 07/17/2015  . Lumbago 12/02/2014  . Acute subdural hematoma ([facility]) 05/20/2014  . Fracture closed, nasal bone 05/19/2014  . Subdural hematoma ([facility]) 05/19/2014  .  Nasal fracture 05/19/2014  . Pain of toe of left foot 03/14/2014  . Tinea pedis of left foot 03/14/2014  . Diabetes mellitus type 2 in obese ([facility]) 12/12/2013  . Medicare annual wellness visit, subsequent 01/17/2013  . Essential hypertension 01/13/2013  . Chronic kidney disease stage III (GFR 30-59 ml/min) 05/03/2012  . Insomnia 06/27/2011  . Osteopenia 09/02/2010  . Family history of colon cancer 09/02/2010  . Diabetes mellitus with chronic kidney disease ([facility])   . Hypertensive heart disease   . Hyperlipidemia   . Carotid artery disease ([facility])   . GERD (gastroesophageal reflux disease)   . CTS (carpal tunnel syndrome) 08/28/2010   Past Medical History:  Diagnosis  Date  . Cataract   . Chronic kidney disease stage III (GFR 30-59 ml/min) 05/03/2012  . Colon polyps   . Diabetes mellitus   . GERD (gastroesophageal reflux disease)   . HTN (hypertension) 01/13/2013  . Hyperlipidemia   . Hypertension   . Irregular heartbeat   . Medicare annual wellness visit, subsequent 01/17/2013  . Preventative health care 01/17/2013  . Preventative health care 07/17/2015  . Vitamin D deficiency 07/17/2015    Family History  Problem Relation Age of Onset  . Cancer Mother     colon, breast, pancreas, skin cancer  . Heart disease Father     heart valve replaced  . Stroke Father   . Diabetes Father   . Cancer Paternal Grandmother     colon  . Obesity Son   . Heart disease Son     bradycardia    Past Surgical History:  Procedure Laterality Date  . ANKLE ARTHROPLASTY    . CATARACT EXTRACTION  2010, 2014  . CATARACT EXTRACTION  02/28/14  . CHOLECYSTECTOMY    . CHOLECYSTECTOMY N/A 05/03/2012   Procedure: LAPAROSCOPIC CHOLECYSTECTOMY WITH INTRAOPERATIVE CHOLANGIOGRAM;  Surgeon: [name], MD;  Location: [facility];  Service: General;  Laterality: N/A;  . ROTATOR CUFF REPAIR    . SHOULDER SURGERY    . TONSILLECTOMY     Social History   Occupational History  . Not on file.   Social History Main Topics  . Smoking status: Never Smoker  . Smokeless tobacco: Never Used  . Alcohol use 0.6 oz/week    1 Glasses of wine per week  . Drug use: No  . 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.     [name], MD   Note - This record has been created using [employer].  Chart creation errors have been sought, but may not always  have been located. Such creation errors do not reflect on  the standard of medical care.

## 2016-07-12 NOTE — Telephone Encounter (Signed)
Done

## 2016-07-15 ENCOUNTER — Other Ambulatory Visit (INDEPENDENT_AMBULATORY_CARE_PROVIDER_SITE_OTHER): Payer: PPO

## 2016-07-15 ENCOUNTER — Encounter (INDEPENDENT_AMBULATORY_CARE_PROVIDER_SITE_OTHER): Payer: Self-pay | Admitting: Orthopaedic Surgery

## 2016-07-15 ENCOUNTER — Ambulatory Visit (INDEPENDENT_AMBULATORY_CARE_PROVIDER_SITE_OTHER): Payer: PPO | Admitting: Orthopaedic Surgery

## 2016-07-15 VITALS — BP 127/62 | HR 71 | Ht 72.0 in | Wt 228.0 lb

## 2016-07-15 DIAGNOSIS — E1159 Type 2 diabetes mellitus with other circulatory complications: Secondary | ICD-10-CM

## 2016-07-15 DIAGNOSIS — E785 Hyperlipidemia, unspecified: Secondary | ICD-10-CM | POA: Diagnosis not present

## 2016-07-15 DIAGNOSIS — E559 Vitamin D deficiency, unspecified: Secondary | ICD-10-CM | POA: Diagnosis not present

## 2016-07-15 DIAGNOSIS — I10 Essential (primary) hypertension: Secondary | ICD-10-CM

## 2016-07-15 DIAGNOSIS — M79642 Pain in left hand: Secondary | ICD-10-CM

## 2016-07-15 LAB — TSH: TSH: 2.61 u[IU]/mL (ref 0.35–4.50)

## 2016-07-15 LAB — COMPREHENSIVE METABOLIC PANEL
ALT: 14 U/L (ref 0–53)
AST: 13 U/L (ref 0–37)
Albumin: 3.6 g/dL (ref 3.5–5.2)
Alkaline Phosphatase: 53 U/L (ref 39–117)
BILIRUBIN TOTAL: 0.6 mg/dL (ref 0.2–1.2)
BUN: 32 mg/dL — ABNORMAL HIGH (ref 6–23)
CHLORIDE: 108 meq/L (ref 96–112)
CO2: 24 meq/L (ref 19–32)
Calcium: 9.3 mg/dL (ref 8.4–10.5)
Creatinine, Ser: 1.48 mg/dL (ref 0.40–1.50)
GFR: 48.49 mL/min — ABNORMAL LOW (ref >60.00)
GLUCOSE: 161 mg/dL — AB (ref 70–99)
Potassium: 4.2 mEq/L (ref 3.5–5.1)
Sodium: 139 mEq/L (ref 135–145)
Total Protein: 6.9 g/dL (ref 6.0–8.3)

## 2016-07-15 LAB — CBC
HEMATOCRIT: 41.1 % (ref 39.0–52.0)
Hemoglobin: 13.2 g/dL (ref 13.0–17.0)
MCHC: 32.1 g/dL (ref 30.0–36.0)
MCV: 86.4 fl (ref 78.0–100.0)
Platelets: 253 10*3/uL (ref 150.0–400.0)
RBC: 4.75 Mil/uL (ref 4.22–5.81)
RDW: 14.7 % (ref 11.5–15.5)
WBC: 8.2 10*3/uL (ref 4.0–10.5)

## 2016-07-15 LAB — LIPID PANEL
CHOL/HDL RATIO: 3
Cholesterol: 95 mg/dL (ref 0–200)
HDL: 29.5 mg/dL — AB (ref >39.00)
LDL CALC: 38 mg/dL (ref 0–99)
NONHDL: 65.9
Triglycerides: 141 mg/dL (ref 0.0–149.0)
VLDL: 28.2 mg/dL (ref 0.0–40.0)

## 2016-07-15 LAB — URINALYSIS, ROUTINE W REFLEX MICROSCOPIC
BILIRUBIN URINE: NEGATIVE
Hgb urine dipstick: NEGATIVE
Ketones, ur: NEGATIVE
LEUKOCYTES UA: NEGATIVE
NITRITE: NEGATIVE
RBC / HPF: NONE SEEN (ref 0–?)
Specific Gravity, Urine: 1.025 (ref 1.000–1.030)
Total Protein, Urine: 30 — AB
Urine Glucose: NEGATIVE
Urobilinogen, UA: 0.2 (ref 0.0–1.0)
pH: 5.5 (ref 5.0–8.0)

## 2016-07-15 LAB — MICROALBUMIN / CREATININE URINE RATIO
Creatinine,U: 141 mg/dL
Microalb Creat Ratio: 18.9 mg/g (ref 0.0–30.0)
Microalb, Ur: 26.7 mg/dL — ABNORMAL HIGH (ref 0.0–1.9)

## 2016-07-15 LAB — VITAMIN D 25 HYDROXY (VIT D DEFICIENCY, FRACTURES): VITD: 60.31 ng/mL (ref 30.00–100.00)

## 2016-07-15 NOTE — Progress Notes (Signed)
Office Visit Note   Patient: Vincent Allen           Date of Birth: 07-28-1933           MRN: [id number] Visit Date: 07/15/2016              Requested by: [name], MD [address] [place], [place] [address] PCP: [name], MD   Assessment & Plan: Visit Diagnoses:  1. Pain of left hand   Skin abrasions over her ring and long metacarpal heads dorsally without infection. No fracture. Films last week demonstrated no evidence of fracture  Plan: [place] dorsal left hand wounds with mupropicin and sterile gauze. Office Friday  Follow-Up Instructions: Return in about 4 days (around 07/19/2016).   Orders:  No orders of the defined types were placed in this encounter.  No orders of the defined types were placed in this encounter.     Procedures: No procedures performed   Clinical Data: No additional findings.   Subjective: Chief Complaint  Patient presents with  . Right Hand - Follow-up  [name] relates it is doing well in terms of his left hand. He was seen Friday in the office. He had a skin avulsion some over the dorsum of the long and ring metacarpals left hand that we dressed with antibiotic ointment. He relates she's doing much better without any fever or chills numbness or tingling.  HPI  Review of Systems   Objective: Vital Signs: BP 127/62   Pulse 71   Ht 6' (1.829 m)   Wt 228 lb (103.4 kg)   BMI 30.92 kg/m   Physical Exam  Ortho Exam left hand exam out of the dressing reveals the wounds to be healing nicely the skin has retracted superficially. There is no erythema or drainage. Still some swelling of the fingers diffusely but less than on Friday. No change in sensibility. Good capillary refill. Ecchymosis is resolving  Specialty Comments:  No specialty comments available.  Imaging: No results found.   PMFS History: Patient Active Problem List   Diagnosis Date Noted  . Bradycardia 07/09/2016  . Atrial fibrillation  with slow ventricular response ([facility]) 07/09/2016  . Vitamin D deficiency 07/17/2015  . Preventative health care 07/17/2015  . Lumbago 12/02/2014  . Acute subdural hematoma (Vincent Allen) 05/20/2014  . Fracture closed, nasal bone 05/19/2014  . Subdural hematoma (Vincent Allen) 05/19/2014  . Nasal fracture 05/19/2014  . Pain of toe of left foot 03/14/2014  . Tinea pedis of left foot 03/14/2014  . Diabetes mellitus type 2 in obese (Vincent Allen) 12/12/2013  . Medicare annual wellness visit, subsequent 01/17/2013  . Essential hypertension 01/13/2013  . Chronic kidney disease stage III (GFR 30-59 ml/min) 05/03/2012  . Insomnia 06/27/2011  . Osteopenia 09/02/2010  . Family history of colon cancer 09/02/2010  . Diabetes mellitus with chronic kidney disease (Vincent Allen)   . Hypertensive heart disease   . Hyperlipidemia   . Carotid artery disease (Vincent Allen)   . GERD (gastroesophageal reflux disease)   . CTS (carpal tunnel syndrome) 08/28/2010   Past Medical History:  Diagnosis Date  . Cataract   . Chronic kidney disease stage III (GFR 30-59 ml/min) 05/03/2012  . Colon polyps   . Diabetes mellitus   . GERD (gastroesophageal reflux disease)   . HTN (hypertension) 01/13/2013  . Hyperlipidemia   . Hypertension   . Irregular heartbeat   . Medicare annual wellness visit, subsequent 01/17/2013  . Preventative health care 01/17/2013  .  Preventative health care 07/17/2015  . Vitamin D deficiency 07/17/2015    Family History  Problem Relation Age of Onset  . Cancer Mother     colon, breast, pancreas, skin cancer  . Heart disease Father     heart valve replaced  . Stroke Father   . Diabetes Father   . Cancer Paternal Grandmother     colon  . Obesity Son   . Heart disease Son     bradycardia    Past Surgical History:  Procedure Laterality Date  . ANKLE ARTHROPLASTY    . CATARACT EXTRACTION  2010, 2014  . CATARACT EXTRACTION  02/28/14  . CHOLECYSTECTOMY    . CHOLECYSTECTOMY N/A 05/03/2012   Procedure: LAPAROSCOPIC  CHOLECYSTECTOMY WITH INTRAOPERATIVE CHOLANGIOGRAM;  Surgeon: [name], MD;  Location: [facility];  Service: General;  Laterality: N/A;  . ROTATOR CUFF REPAIR    . SHOULDER SURGERY    . TONSILLECTOMY     Social History   Occupational History  . Not on file.   Social History Main Topics  . Smoking status: Never Smoker  . Smokeless tobacco: Never Used  . Alcohol use 0.6 oz/week    1 Glasses of wine per week  . Drug use: No  . 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.     [name], MD   Note - This record has been created using [employer].  Chart creation errors have been sought, but may not always  have been located. Such creation errors do not reflect on  the standard of medical care.

## 2016-07-15 NOTE — Addendum Note (Signed)
Addended by: Caffie Pinto on: 07/15/2016 10:22 AM   Modules accepted: Orders

## 2016-07-18 ENCOUNTER — Ambulatory Visit (INDEPENDENT_AMBULATORY_CARE_PROVIDER_SITE_OTHER): Payer: PPO

## 2016-07-18 ENCOUNTER — Telehealth: Payer: Self-pay | Admitting: Cardiology

## 2016-07-18 DIAGNOSIS — I480 Paroxysmal atrial fibrillation: Secondary | ICD-10-CM | POA: Diagnosis not present

## 2016-07-18 NOTE — Telephone Encounter (Signed)
Received call from [employer] stating patient in AFib, baseline 60, HR controlled. (monitor placed this morning from hospital d/c where pt experienced AFib RVR.  Pt is on Eliquis) Called pt who reports no symptoms but appreciates the follow up. Reviewed w/ DOD, Dr. Meda Coffee,, no orders received.

## 2016-07-18 NOTE — Telephone Encounter (Signed)
New Message:      Crystal from [facility] has a Critical EKG-

## 2016-07-19 ENCOUNTER — Ambulatory Visit (INDEPENDENT_AMBULATORY_CARE_PROVIDER_SITE_OTHER): Payer: PPO | Admitting: Orthopaedic Surgery

## 2016-07-19 ENCOUNTER — Encounter: Payer: Self-pay | Admitting: Family Medicine

## 2016-07-19 ENCOUNTER — Encounter (INDEPENDENT_AMBULATORY_CARE_PROVIDER_SITE_OTHER): Payer: Self-pay | Admitting: Orthopaedic Surgery

## 2016-07-19 VITALS — Ht 68.0 in

## 2016-07-19 DIAGNOSIS — M79642 Pain in left hand: Secondary | ICD-10-CM

## 2016-07-19 MED ORDER — FUROSEMIDE 20 MG PO TABS: 20.0000 mg | ORAL_TABLET | Freq: Every day | ORAL | 0 refills | Status: DC | PRN

## 2016-07-19 NOTE — Telephone Encounter (Signed)
Spoke w/ patient and notified him of instructions and he verbalized understanding. He denies SOB, CP, palpitations. He would like to try lasix over the weekend and keep HFU as scheduled. He declined to schedule OV on Monday with another provider.  Medication filled to pharmacy as requested. Dr. [name] notified of above.

## 2016-07-19 NOTE — Progress Notes (Signed)
Office Visit Note   Patient: Vincent Allen           Date of Birth: Sep 03, 1933           MRN: [id number] Visit Date: 07/19/2016              Requested by: Vincent Lukes, MD [address] [place], [place] [address] PCP: Vincent Homans, MD   Assessment & Plan: Visit Diagnoses:  1. Pain of left hand    Abrasions left hand as previously noted-stable Plan: Keep abrasions clean and dry. He'll need for Band-Aid. office as needed.  Follow-Up Instructions: Return if symptoms worsen or fail to improve.   Orders:  No orders of the defined types were placed in this encounter.  No orders of the defined types were placed in this encounter.     Procedures: No procedures performed   Clinical Data: No additional findings.   Subjective: Chief Complaint  Patient presents with  . Left Hand - Follow-up    Vincent Allen is a 83 y o Skin abrasions over her ring and long metacarpal heads dorsally without infection. No fracture. Films last week demonstrated no evidence of fracture 2. His sons relates he has swelling 2 x days in feet/ankles    I have been following Vincent Allen for the abrasions and the dorsum of his left hand. He is not having any pain. He also has significant arthritis in multiple joints of the hand with some stiffness HPI  Review of Systems   Objective: Vital Signs: Ht 5\' 8"  (1.727 m)   Physical Exam  Ortho Exam dressing was removed from his left hand. The abrasions over the metacarpal heads dorsally are clean and dry and beginning to epithelialize. There is no redness. The ecchymosis is resolving. He does have some stiffness across the MP and PIP joints related to his arthritis. There is no evidence of lymphangitis or drainage  Specialty Comments:  No specialty comments available.  Imaging: No results found.   PMFS History: Patient Active Problem List   Diagnosis Date Noted  . Bradycardia 07/09/2016  . Atrial fibrillation with slow  ventricular response ([facility]) 07/09/2016  . Vitamin D deficiency 07/17/2015  . Preventative health care 07/17/2015  . Lumbago 12/02/2014  . Acute subdural hematoma ([facility]) 05/20/2014  . Fracture closed, nasal bone 05/19/2014  . Subdural hematoma ([facility]) 05/19/2014  . Nasal fracture 05/19/2014  . Pain of toe of left foot 03/14/2014  . Tinea pedis of left foot 03/14/2014  . Diabetes mellitus type 2 in obese ([facility]) 12/12/2013  . Medicare annual wellness visit, subsequent 01/17/2013  . Essential hypertension 01/13/2013  . Chronic kidney disease stage III (GFR 30-59 ml/min) 05/03/2012  . Insomnia 06/27/2011  . Osteopenia 09/02/2010  . Family history of colon cancer 09/02/2010  . Diabetes mellitus with chronic kidney disease ([employer])   . Hypertensive heart disease   . Hyperlipidemia   . Carotid artery disease ([facility])   . GERD (gastroesophageal reflux disease)   . CTS (carpal tunnel syndrome) 08/28/2010   Past Medical History:  Diagnosis Date  . Cataract   . Chronic kidney disease stage III (GFR 30-59 ml/min) 05/03/2012  . Colon polyps   . Diabetes mellitus   . GERD (gastroesophageal reflux disease)   . HTN (hypertension) 01/13/2013  . Hyperlipidemia   . Hypertension   . Irregular heartbeat   . Medicare annual wellness visit, subsequent 01/17/2013  . Preventative health care 01/17/2013  . Preventative health care  07/17/2015  . Vitamin D deficiency 07/17/2015    Family History  Problem Relation Age of Onset  . Cancer Mother     colon, breast, pancreas, skin cancer  . Heart disease Father     heart valve replaced  . Stroke Father   . Diabetes Father   . Cancer Paternal Grandmother     colon  . Obesity Son   . Heart disease Son     bradycardia    Past Surgical History:  Procedure Laterality Date  . ANKLE ARTHROPLASTY    . CATARACT EXTRACTION  2010, 2014  . CATARACT EXTRACTION  02/28/14  . CHOLECYSTECTOMY    . CHOLECYSTECTOMY N/A 05/03/2012   Procedure: LAPAROSCOPIC CHOLECYSTECTOMY WITH  INTRAOPERATIVE CHOLANGIOGRAM;  Surgeon: [name], MD;  Location: [facility];  Service: General;  Laterality: N/A;  . ROTATOR CUFF REPAIR    . SHOULDER SURGERY    . TONSILLECTOMY     Social History   Occupational History  . Not on file.   Social History Main Topics  . Smoking status: Never Smoker  . Smokeless tobacco: Never Used  . Alcohol use 0.6 oz/week    1 Glasses of wine per week  . Drug use: No  . 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.     Vincent Balding, MD   Note - This record has been created using [employer].  Chart creation errors have been sought, but may not always  have been located. Such creation errors do not reflect on  the standard of medical care.

## 2016-07-22 ENCOUNTER — Other Ambulatory Visit (INDEPENDENT_AMBULATORY_CARE_PROVIDER_SITE_OTHER): Payer: PPO

## 2016-07-22 DIAGNOSIS — E1169 Type 2 diabetes mellitus with other specified complication: Secondary | ICD-10-CM | POA: Diagnosis not present

## 2016-07-22 DIAGNOSIS — E669 Obesity, unspecified: Secondary | ICD-10-CM | POA: Diagnosis not present

## 2016-07-22 DIAGNOSIS — E785 Hyperlipidemia, unspecified: Secondary | ICD-10-CM

## 2016-07-22 DIAGNOSIS — I10 Essential (primary) hypertension: Secondary | ICD-10-CM

## 2016-07-22 LAB — COMPREHENSIVE METABOLIC PANEL
ALT: 15 U/L (ref 0–53)
AST: 14 U/L (ref 0–37)
Albumin: 3.8 g/dL (ref 3.5–5.2)
Alkaline Phosphatase: 48 U/L (ref 39–117)
BUN: 33 mg/dL — ABNORMAL HIGH (ref 6–23)
CALCIUM: 9 mg/dL (ref 8.4–10.5)
CHLORIDE: 107 meq/L (ref 96–112)
CO2: 24 meq/L (ref 19–32)
Creatinine, Ser: 1.75 mg/dL — ABNORMAL HIGH (ref 0.40–1.50)
GFR: 39.96 mL/min — ABNORMAL LOW (ref >60.00)
Glucose, Bld: 90 mg/dL (ref 70–99)
POTASSIUM: 4 meq/L (ref 3.5–5.1)
Sodium: 140 mEq/L (ref 135–145)
Total Bilirubin: 0.6 mg/dL (ref 0.2–1.2)
Total Protein: 6.8 g/dL (ref 6.0–8.3)

## 2016-07-22 LAB — LIPID PANEL
CHOL/HDL RATIO: 3
Cholesterol: 93 mg/dL (ref 0–200)
HDL: 33.3 mg/dL — ABNORMAL LOW (ref >39.00)
LDL Cholesterol: 38 mg/dL (ref 0–99)
NONHDL: 60.15
Triglycerides: 112 mg/dL (ref 0.0–149.0)
VLDL: 22.4 mg/dL (ref 0.0–40.0)

## 2016-07-22 LAB — CBC
HCT: 39.3 % (ref 39.0–52.0)
Hemoglobin: 12.8 g/dL — ABNORMAL LOW (ref 13.0–17.0)
MCHC: 32.5 g/dL (ref 30.0–36.0)
MCV: 85.5 fl (ref 78.0–100.0)
Platelets: 237 10*3/uL (ref 150.0–400.0)
RBC: 4.59 Mil/uL (ref 4.22–5.81)
RDW: 14.8 % (ref 11.5–15.5)
WBC: 7.5 10*3/uL (ref 4.0–10.5)

## 2016-07-22 LAB — TSH: TSH: 1.95 u[IU]/mL (ref 0.35–4.50)

## 2016-07-22 LAB — HEMOGLOBIN A1C: Hgb A1c MFr Bld: 6.6 % — ABNORMAL HIGH (ref 4.6–6.5)

## 2016-07-25 ENCOUNTER — Ambulatory Visit (INDEPENDENT_AMBULATORY_CARE_PROVIDER_SITE_OTHER): Payer: PPO | Admitting: Family Medicine

## 2016-07-25 ENCOUNTER — Encounter: Payer: Self-pay | Admitting: Family Medicine

## 2016-07-25 VITALS — BP 139/53 | HR 62 | Temp 98.4°F | Ht 68.0 in | Wt 227.0 lb

## 2016-07-25 DIAGNOSIS — N289 Disorder of kidney and ureter, unspecified: Secondary | ICD-10-CM

## 2016-07-25 DIAGNOSIS — Z0000 Encounter for general adult medical examination without abnormal findings: Secondary | ICD-10-CM | POA: Diagnosis not present

## 2016-07-25 DIAGNOSIS — E119 Type 2 diabetes mellitus without complications: Secondary | ICD-10-CM

## 2016-07-25 DIAGNOSIS — E0822 Diabetes mellitus due to underlying condition with diabetic chronic kidney disease: Secondary | ICD-10-CM

## 2016-07-25 DIAGNOSIS — E785 Hyperlipidemia, unspecified: Secondary | ICD-10-CM | POA: Diagnosis not present

## 2016-07-25 DIAGNOSIS — I10 Essential (primary) hypertension: Secondary | ICD-10-CM | POA: Diagnosis not present

## 2016-07-25 DIAGNOSIS — N183 Chronic kidney disease, stage 3 unspecified: Secondary | ICD-10-CM

## 2016-07-25 DIAGNOSIS — R001 Bradycardia, unspecified: Secondary | ICD-10-CM

## 2016-07-25 DIAGNOSIS — I4891 Unspecified atrial fibrillation: Secondary | ICD-10-CM | POA: Diagnosis not present

## 2016-07-25 LAB — COMPREHENSIVE METABOLIC PANEL
ALT: 17 U/L (ref 0–53)
AST: 15 U/L (ref 0–37)
Albumin: 4 g/dL (ref 3.5–5.2)
Alkaline Phosphatase: 58 U/L (ref 39–117)
BUN: 39 mg/dL — AB (ref 6–23)
CHLORIDE: 104 meq/L (ref 96–112)
CO2: 25 meq/L (ref 19–32)
CREATININE: 1.73 mg/dL — AB (ref 0.40–1.50)
Calcium: 9.4 mg/dL (ref 8.4–10.5)
GFR: 40.49 mL/min — ABNORMAL LOW (ref >60.00)
GLUCOSE: 50 mg/dL — AB (ref 70–99)
POTASSIUM: 3.8 meq/L (ref 3.5–5.1)
SODIUM: 139 meq/L (ref 135–145)
Total Bilirubin: 0.6 mg/dL (ref 0.2–1.2)
Total Protein: 7.2 g/dL (ref 6.0–8.3)

## 2016-07-25 NOTE — Patient Instructions (Signed)
Preventive Care 65 Years and Older, Male Preventive care refers to lifestyle choices and visits with your health care provider that can promote health and wellness. What does preventive care include?  A yearly physical exam. This is also called an annual well check.  Dental exams once or twice a year.  Routine eye exams. Ask your health care provider how often you should have your eyes checked.  Personal lifestyle choices, including:  Daily care of your teeth and gums.  Regular physical activity.  Eating a healthy diet.  Avoiding tobacco and drug use.  Limiting alcohol use.  Practicing safe sex.  Taking low doses of aspirin every day.  Taking vitamin and mineral supplements as recommended by your health care provider. What happens during an annual well check? The services and screenings done by your health care provider during your annual well check will depend on your age, overall health, lifestyle risk factors, and family history of disease. Counseling  Your health care provider may ask you questions about your:  Alcohol use.  Tobacco use.  Drug use.  Emotional well-being.  Home and relationship well-being.  Sexual activity.  Eating habits.  History of falls.  Memory and ability to understand (cognition).  Work and work environment. Screening  You may have the following tests or measurements:  Height, weight, and BMI.  Blood pressure.  Lipid and cholesterol levels. These may be checked every 5 years, or more frequently if you are over 50 years old.  Skin check.  Lung cancer screening. You may have this screening every year starting at age 55 if you have a 30-pack-year history of smoking and currently smoke or have quit within the past 15 years.  Fecal occult blood test (FOBT) of the stool. You may have this test every year starting at age 50.  Flexible sigmoidoscopy or colonoscopy. You may have a sigmoidoscopy every 5 years or a colonoscopy every 10  years starting at age 50.  Prostate cancer screening. Recommendations will vary depending on your family history and other risks.  Hepatitis C blood test.  Hepatitis B blood test.  Sexually transmitted disease (STD) testing.  Diabetes screening. This is done by checking your blood sugar (glucose) after you have not eaten for a while (fasting). You may have this done every 1-3 years.  Abdominal aortic aneurysm (AAA) screening. You may need this if you are a current or former smoker.  Osteoporosis. You may be screened starting at age 70 if you are at high risk. Talk with your health care provider about your test results, treatment options, and if necessary, the need for more tests. Vaccines  Your health care provider may recommend certain vaccines, such as:  Influenza vaccine. This is recommended every year.  Tetanus, diphtheria, and acellular pertussis (Tdap, Td) vaccine. You may need a Td booster every 10 years.  Varicella vaccine. You may need this if you have not been vaccinated.  Zoster vaccine. You may need this after age 60.  Measles, mumps, and rubella (MMR) vaccine. You may need at least one dose of MMR if you were born in 1957 or later. You may also need a second dose.  Pneumococcal 13-valent conjugate (PCV13) vaccine. One dose is recommended after age 81.  Pneumococcal polysaccharide (PPSV23) vaccine. One dose is recommended after age 81.  Meningococcal vaccine. You may need this if you have certain conditions.  Hepatitis A vaccine. You may need this if you have certain conditions or if you travel or work in places where   you may be exposed to hepatitis A.  Hepatitis B vaccine. You may need this if you have certain conditions or if you travel or work in places where you may be exposed to hepatitis B.  Haemophilus influenzae type b (Hib) vaccine. You may need this if you have certain risk factors. Talk to your health care provider about which screenings and vaccines you  need and how often you need them. This information is not intended to replace advice given to you by your health care provider. Make sure you discuss any questions you have with your health care provider. Document Released: 03/31/2015 Document Revised: 11/22/2015 Document Reviewed: 01/03/2015 Elsevier Interactive Patient Education  2017 [employer].

## 2016-07-25 NOTE — Progress Notes (Signed)
Patient ID: Vincent Allen, male   DOB: 1934-02-14, 83 y.o.   MRN: [id number]   Subjective:  I acted as a Education administrator for Vincent Allen, Vincent Allen, [place]   Patient ID: Vincent Allen, male    DOB: June 26, 1933, 83 y.o.   MRN: [id number]  Chief Complaint  Patient presents with  . Hospitalization Follow-up    Admitted on 07/15/2016 for a fall.  . Annual Exam  . Diabetes  . Hypertension  . Hyperlipidemia    Diabetes  He presents for his follow-up diabetic visit. He has type 2 diabetes mellitus. Pertinent negatives for hypoglycemia include no headaches. Pertinent negatives for diabetes include no blurred vision and no chest pain. Risk factors for coronary artery disease include male sex, hypertension and diabetes mellitus.  Hypertension  This is a chronic problem. The problem is controlled. Pertinent negatives include no blurred vision, chest pain, headaches, malaise/fatigue, palpitations or shortness of breath. Risk factors for coronary artery disease include male gender and diabetes mellitus.  Hyperlipidemia  This is a chronic problem. The problem is controlled. Pertinent negatives include no chest pain or shortness of breath. Risk factors for coronary artery disease include hypertension, male sex and diabetes mellitus.    Patient is in today for an Hospital follow up. Patient fell and was admitted on 07/08/2016; staying for two days.Also presents for an annual examination.Patient is currently wearing a heart monitor for two weeks per Cardiology. States that his glucose readings have been around 119. Patient has a Hx of HTN, Type II Diabetes, hyperlipidemia, GERD, osteopenia, insomnia, bradycardia. Patient has no acute concerns noted at this time. He denies any polyuria or polydipsia. Denies CP/palp/SOB/HA/congestion/fevers/GI or GU c/o. Taking meds as prescribed  Patient Care Team: [name], MD as PCP - General (Family Medicine) Clent Jacks, MD as Consulting Physician  (Ophthalmology) Danella Sensing, MD as Consulting Physician (Dermatology) Dorothy Spark, MD as Consulting Physician (Cardiology) [name], MD as Consulting Physician (Orthopedic Surgery) Paulla Dolly Tamala Fothergill, DPM as Consulting Physician (Podiatry)   Past Medical History:  Diagnosis Date  . Cataract   . Chronic kidney disease stage III (GFR 30-59 ml/min) 05/03/2012  . Colon polyps   . Diabetes mellitus   . GERD (gastroesophageal reflux disease)   . HTN (hypertension) 01/13/2013  . Hyperlipidemia   . Hypertension   . Irregular heartbeat   . Medicare annual wellness visit, subsequent 01/17/2013  . Preventative health care 01/17/2013  . Preventative health care 07/17/2015  . Vitamin D deficiency 07/17/2015    Past Surgical History:  Procedure Laterality Date  . ANKLE ARTHROPLASTY    . CATARACT EXTRACTION  2010, 2014  . CATARACT EXTRACTION  02/28/14  . CHOLECYSTECTOMY    . CHOLECYSTECTOMY N/A 05/03/2012   Procedure: LAPAROSCOPIC CHOLECYSTECTOMY WITH INTRAOPERATIVE CHOLANGIOGRAM;  Surgeon: [name], MD;  Location: [facility];  Service: General;  Laterality: N/A;  . ROTATOR CUFF REPAIR    . SHOULDER SURGERY    . TONSILLECTOMY      Family History  Problem Relation Age of Onset  . Cancer Mother        colon, breast, pancreas, skin cancer  . Heart disease Father        heart valve replaced  . Stroke Father   . Diabetes Father   . Cancer Paternal Grandmother        colon  . Obesity Son   . Heart disease Son        bradycardia    Social History  Social History  . Marital status: Divorced    Spouse name: N/A  . Number of children: N/A  . Years of education: N/A   Occupational History  . Not on file.   Social History Main Topics  . Smoking status: Never Smoker  . Smokeless tobacco: Never Used  . Alcohol use 0.6 oz/week    1 Glasses of wine per week  . Drug use: No  . 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.   Other Topics Concern  . Not on file   Social History Narrative  . No narrative on file    Outpatient Medications Prior to Visit  Medication Sig Dispense Refill  . amLODipine (NORVASC) 5 MG tablet TAKE 1 TABLET BY MOUTH EVERY DAY 90 tablet 1  . apixaban (ELIQUIS) 2.5 MG TABS tablet Take 1 tablet (2.5 mg total) by mouth 2 (two) times daily. 60 tablet 0  . atorvastatin (LIPITOR) 20 MG tablet TAKE 1 TABLET BY MOUTH DAILY 90 tablet 3  . Calcium 600 MG tablet Take 1 tablet (600 mg total) by mouth 2 (two) times daily. 60 tablet 0  . furosemide (LASIX) 20 MG tablet Take 1 tablet (20 mg total) by mouth daily as needed for edema (weight gain >3lbs in 24 hrs, SOB). 30 tablet 0  . glipiZIDE (GLUCOTROL) 10 MG tablet TAKE 2 TABLETS BY MOUTH TWICE DAILY BEFORE MEALS 360 tablet 1  . glucose blood (ONE TOUCH ULTRA TEST) test strip Use as twice daily to check blood sugar.  DX E11.9 100 each 6  . JANUVIA 100 MG tablet TAKE 1/2 TO 1 TABLET DAILY (Patient taking differently: TAKE 1 TABLET DAILY) 30 tablet 5  . Lancets (ONETOUCH ULTRASOFT) lancets Use as instructed to check blood sugar twice a day as needed Dx Code E11.9 100 each 6  . metFORMIN (GLUCOPHAGE-XR) 500 MG 24 hr tablet Take 1 tablet (500 mg total) by mouth 2 (two) times daily. 180 tablet 1  . traMADol (ULTRAM) 50 MG tablet Take 1 tablet (50 mg total) by mouth every 6 (six) hours as needed for moderate pain. 15 tablet 0  . triamterene-hydrochlorothiazide (MAXZIDE) 75-50 MG tablet TAKE 1 TABLET BY MOUTH DAILY. 90 tablet 1   No facility-administered medications prior to visit.     Allergies  Allergen Reactions  . Penicillins Hives and Itching  . Verapamil     Junctional rhythm  . Influenza Vaccines Rash    Review of Systems  Constitutional: Negative for fever and malaise/fatigue.  HENT: Negative for congestion.   Eyes: Negative for blurred vision.  Respiratory: Negative for cough and  shortness of breath.   Cardiovascular: Negative for chest pain, palpitations and leg swelling.  Gastrointestinal: Negative for vomiting.  Musculoskeletal: Positive for falls. Negative for back pain.       Fell on 07/08/2016.  Skin: Negative for rash.  Neurological: Negative for loss of consciousness and headaches.       Objective:    Physical Exam  Constitutional: He is oriented to person, place, and time. He appears well-developed and well-nourished. No distress.  HENT:  Head: Normocephalic and atraumatic.  Eyes: Conjunctivae are normal.  Neck: Normal range of motion. No thyromegaly present.  Cardiovascular: Normal rate and regular rhythm.   Pulmonary/Chest: Effort normal and breath sounds normal. He has no wheezes.  Abdominal: Soft. Bowel sounds are normal. There is no tenderness.  Musculoskeletal: He exhibits edema. He exhibits no deformity.  2+ pedal edema bilaterally.  Neurological: He is alert and oriented to person, place, and time.  Skin: Skin is warm and dry. He is not diaphoretic.  Psychiatric: He has a normal mood and affect.    BP (!) 139/53 (BP Location: Right Arm, Patient Position: Sitting, Cuff Size: Large)   Pulse 62   Temp 98.4 F (36.9 C) (Oral)   Ht 5\' 8"  (1.727 m)   Wt 227 lb (103 kg)   SpO2 96% Comment: RA  BMI 34.52 kg/m  Wt Readings from Last 3 Encounters:  07/25/16 227 lb (103 kg)  07/15/16 228 lb (103.4 kg)  07/11/16 228 lb (103.4 kg)   BP Readings from Last 3 Encounters:  07/25/16 (!) 139/53  07/15/16 127/62  07/11/16 (!) 165/68     Immunization History  Administered Date(s) Administered  . Pneumococcal Conjugate-13 03/23/2015  . Pneumococcal Polysaccharide-23 06/27/2011  . Tdap 05/19/2014    Health Maintenance  Topic Date Due  . FOOT EXAM  10/25/2016 (Originally 07/16/2016)  . INFLUENZA VACCINE  03/10/2017 (Originally 10/16/2016)  . HEMOGLOBIN A1C  01/22/2017  . OPHTHALMOLOGY EXAM  05/22/2017  . URINE MICROALBUMIN  07/15/2017  .  TETANUS/TDAP  05/18/2024  . PNA vac Low Risk Adult  Completed    Lab Results  Component Value Date   WBC 7.5 07/22/2016   HGB 12.8 (L) 07/22/2016   HCT 39.3 07/22/2016   PLT 237.0 07/22/2016   GLUCOSE 50 (L) 07/25/2016   CHOL 93 07/22/2016   TRIG 112.0 07/22/2016   HDL 33.30 (L) 07/22/2016   LDLDIRECT 70.8 03/14/2014   LDLCALC 38 07/22/2016   ALT 17 07/25/2016   AST 15 07/25/2016   NA 139 07/25/2016   K 3.8 07/25/2016   CL 104 07/25/2016   CREATININE 1.73 (H) 07/25/2016   BUN 39 (H) 07/25/2016   CO2 25 07/25/2016   TSH 1.95 07/22/2016   PSA 1.18 10/07/2012   INR 1.21 05/23/2014   HGBA1C 6.6 (H) 07/22/2016   MICROALBUR 26.7 (H) 07/15/2016    Lab Results  Component Value Date   TSH 1.95 07/22/2016   Lab Results  Component Value Date   WBC 7.5 07/22/2016   HGB 12.8 (L) 07/22/2016   HCT 39.3 07/22/2016   MCV 85.5 07/22/2016   PLT 237.0 07/22/2016   Lab Results  Component Value Date   NA 139 07/25/2016   K 3.8 07/25/2016   CO2 25 07/25/2016   GLUCOSE 50 (L) 07/25/2016   BUN 39 (H) 07/25/2016   CREATININE 1.73 (H) 07/25/2016   BILITOT 0.6 07/25/2016   ALKPHOS 58 07/25/2016   AST 15 07/25/2016   ALT 17 07/25/2016   PROT 7.2 07/25/2016   ALBUMIN 4.0 07/25/2016   CALCIUM 9.4 07/25/2016   ANIONGAP 8 07/10/2016   GFR 40.49 (L) 07/25/2016   Lab Results  Component Value Date   CHOL 93 07/22/2016   Lab Results  Component Value Date   HDL 33.30 (L) 07/22/2016   Lab Results  Component Value Date   LDLCALC 38 07/22/2016   Lab Results  Component Value Date   TRIG 112.0 07/22/2016   Lab Results  Component Value Date   CHOLHDL 3 07/22/2016   Lab Results  Component Value Date   HGBA1C 6.6 (H) 07/22/2016         Assessment & Plan:   Problem List Items Addressed This Visit    Diabetes mellitus with chronic kidney disease ([place]) (Chronic)  hgba1c acceptable, minimize simple carbs. Increase exercise as tolerated. Continue current meds       Hyperlipidemia (Chronic)    Tolerating statin, encouraged heart healthy diet, avoid trans fats, minimize simple carbs and saturated fats. Increase exercise as tolerated      Relevant Orders   Lipid panel   Chronic kidney disease stage III (GFR 30-59 ml/min) (Chronic)    Stable will continue to monitor      Essential hypertension    Well controlled, no changes to meds. Encouraged heart healthy diet such as the DASH diet and exercise as tolerated.       Preventative health care - Primary   Bradycardia    Suffered a fall from symptomatic bradycardia recently and was hospitalized fortunately no persistent pain, he has a follow up with cardiology later this month.      Atrial fibrillation with slow ventricular response (H[facility])    Tolerating Eliquis rate controlle       Other Visit Diagnoses    Renal insufficiency       Relevant Orders   Comprehensive metabolic panel (Completed)   Type 2 diabetes mellitus without complication, unspecified whether long term insulin use ([place])       Relevant Orders   Hemoglobin A1c   Hypertension, unspecified type       Relevant Orders   CBC   Comprehensive metabolic panel   TSH      I am having Mr. Agena maintain his calcium carbonate, glucose blood, onetouch ultrasoft, JANUVIA, metFORMIN, glipiZIDE, atorvastatin, triamterene-hydrochlorothiazide, amLODipine, apixaban, traMADol, furosemide, and pregabalin.  Meds ordered this encounter  Medications  . pregabalin (LYRICA) 25 MG capsule    Sig: Take 25 mg by mouth 3 (three) times daily. Take 2 tablets in the morning and 1 tablet in the evening.    CMA served as Education administrator during this visit. History, Physical and Plan performed by medical provider. Documentation and orders reviewed and attested to.  Vincent Homans, MD

## 2016-07-25 NOTE — Progress Notes (Signed)
Pre visit review using our clinic review tool, if applicable. No additional management support is needed unless otherwise documented below in the visit note. 

## 2016-07-28 NOTE — Assessment & Plan Note (Signed)
Tolerating Eliquis rate controlle

## 2016-07-28 NOTE — Assessment & Plan Note (Signed)
hgba1c acceptable, minimize simple carbs. Increase exercise as tolerated. Continue current meds 

## 2016-07-28 NOTE — Assessment & Plan Note (Signed)
Tolerating statin, encouraged heart healthy diet, avoid trans fats, minimize simple carbs and saturated fats. Increase exercise as tolerated 

## 2016-07-28 NOTE — Assessment & Plan Note (Signed)
Suffered a fall from symptomatic bradycardia recently and was hospitalized fortunately no persistent pain, he has a follow up with cardiology later this month.

## 2016-07-28 NOTE — Assessment & Plan Note (Signed)
Well controlled, no changes to meds. Encouraged heart healthy diet such as the DASH diet and exercise as tolerated.  °

## 2016-07-28 NOTE — Assessment & Plan Note (Signed)
Stable--will continue to monitor

## 2016-07-29 ENCOUNTER — Other Ambulatory Visit: Payer: Self-pay | Admitting: Family Medicine

## 2016-08-12 ENCOUNTER — Encounter: Payer: Self-pay | Admitting: Family Medicine

## 2016-08-13 ENCOUNTER — Telehealth: Payer: Self-pay | Admitting: Internal Medicine

## 2016-08-13 ENCOUNTER — Other Ambulatory Visit: Payer: Self-pay | Admitting: Family Medicine

## 2016-08-13 NOTE — Telephone Encounter (Signed)
Patient calling the office for samples of medication:   1.  What medication and dosage are you requesting samples for? eliquis 5mg  2.  Are you currently out of this medication? Yes, since 08-12-2016  he has appt with Dr. Debara Pickett 08-15-2016

## 2016-08-13 NOTE — Telephone Encounter (Signed)
Spoke with pt, aware we only have the 2.5 mg tablets. Patient voiced understanding to take 2 tablets twice daily. Patient aware samples are at the front desk for pick up

## 2016-08-14 ENCOUNTER — Other Ambulatory Visit: Payer: Self-pay | Admitting: Family Medicine

## 2016-08-14 MED ORDER — APIXABAN 2.5 MG PO TABS: 2.5000 mg | ORAL_TABLET | Freq: Two times a day (BID) | ORAL | 2 refills | Status: DC

## 2016-08-14 NOTE — Telephone Encounter (Signed)
Not previously prescribed by PCP----advise if ok to fill

## 2016-08-15 ENCOUNTER — Encounter: Payer: Self-pay | Admitting: Internal Medicine

## 2016-08-15 ENCOUNTER — Other Ambulatory Visit: Payer: Self-pay | Admitting: *Deleted

## 2016-08-15 ENCOUNTER — Ambulatory Visit (INDEPENDENT_AMBULATORY_CARE_PROVIDER_SITE_OTHER): Payer: PPO | Admitting: Internal Medicine

## 2016-08-15 VITALS — BP 132/66 | HR 52 | Ht 68.0 in | Wt 225.4 lb

## 2016-08-15 DIAGNOSIS — R5381 Other malaise: Secondary | ICD-10-CM | POA: Insufficient documentation

## 2016-08-15 DIAGNOSIS — Z01812 Encounter for preprocedural laboratory examination: Secondary | ICD-10-CM

## 2016-08-15 DIAGNOSIS — D689 Coagulation defect, unspecified: Secondary | ICD-10-CM

## 2016-08-15 DIAGNOSIS — I5031 Acute diastolic (congestive) heart failure: Secondary | ICD-10-CM | POA: Diagnosis not present

## 2016-08-15 DIAGNOSIS — R5383 Other fatigue: Secondary | ICD-10-CM | POA: Diagnosis not present

## 2016-08-15 DIAGNOSIS — I4892 Unspecified atrial flutter: Secondary | ICD-10-CM

## 2016-08-15 LAB — BASIC METABOLIC PANEL
BUN/Creatinine Ratio: 20 (ref 10–24)
BUN: 32 mg/dL — ABNORMAL HIGH (ref 8–27)
CALCIUM: 9.4 mg/dL (ref 8.6–10.2)
CO2: 23 mmol/L (ref 18–29)
CREATININE: 1.57 mg/dL — AB (ref 0.76–1.27)
Chloride: 104 mmol/L (ref 96–106)
GFR calc Af Amer: 47 mL/min/{1.73_m2} — ABNORMAL LOW (ref >59)
GFR, EST NON AFRICAN AMERICAN: 41 mL/min/{1.73_m2} — AB (ref >59)
Glucose: 176 mg/dL — ABNORMAL HIGH (ref 65–99)
POTASSIUM: 3.9 mmol/L (ref 3.5–5.2)
Sodium: 135 mmol/L (ref 134–144)

## 2016-08-15 LAB — PROTIME-INR
INR: 1 (ref 0.8–1.2)
PROTHROMBIN TIME: 10.4 s (ref 9.1–12.0)

## 2016-08-15 LAB — CBC
HEMATOCRIT: 40.5 % (ref 37.5–51.0)
HEMOGLOBIN: 13.8 g/dL (ref 13.0–17.7)
MCH: 27.4 pg (ref 26.6–33.0)
MCHC: 34.1 g/dL (ref 31.5–35.7)
MCV: 81 fL (ref 79–97)
Platelets: 212 10*3/uL (ref 150–379)
RBC: 5.03 x10E6/uL (ref 4.14–5.80)
RDW: 13.6 % (ref 12.3–15.4)
WBC: 7.5 10*3/uL (ref 3.4–10.8)

## 2016-08-15 LAB — APTT: APTT: 28 s (ref 24–33)

## 2016-08-15 NOTE — Progress Notes (Signed)
OFFICE FOLLOW-UP NOTE  Chief Complaint:  Follow-up atrial flutter  Primary Care Physician: [name], MD  HPI:  Vincent Allen is a 83 y.o. male with a past medial history significant for atrial fibrillation/flutter with slow ventricular response. Seen recently in the hospital after mechanical fall in the home. He has a history of paroxysmal atrial fibrillation in the past and was seen by Dr. Wynonia Lawman that was not on any anticoagulation. His CHADSVASC score is 4. He was started on Eliquis 2.5 mg twice a day due to age greater than 80 creatinine greater than 1.5. An echocardiogram was performed which showed normal systolic function, diastolic dysfunction moderate left atrial enlargement. I arranged for outpatient monitoring which shows persistent atrial fibrillation/flutter with controlled ventricular response and at times bradycardia but no clear long pauses or any other indications for pacemaker at this time. He reports being fatigued. He's been an active problem for more than 60 years and is interested in getting back to flying feels that his lack of energy is keeping him back from that.  PMHx:  Past Medical History:  Diagnosis Date  . Cataract   . Chronic kidney disease stage III (GFR 30-59 ml/min) 05/03/2012  . Colon polyps   . Diabetes mellitus   . GERD (gastroesophageal reflux disease)   . HTN (hypertension) 01/13/2013  . Hyperlipidemia   . Hypertension   . Irregular heartbeat   . Medicare annual wellness visit, subsequent 01/17/2013  . Preventative health care 01/17/2013  . Preventative health care 07/17/2015  . Vitamin D deficiency 07/17/2015    Past Surgical History:  Procedure Laterality Date  . ANKLE ARTHROPLASTY    . CATARACT EXTRACTION  2010, 2014  . CATARACT EXTRACTION  02/28/14  . CHOLECYSTECTOMY    . CHOLECYSTECTOMY N/A 05/03/2012   Procedure: LAPAROSCOPIC CHOLECYSTECTOMY WITH INTRAOPERATIVE CHOLANGIOGRAM;  Surgeon: [name], MD;  Location: [facility];  Service:  General;  Laterality: N/A;  . ROTATOR CUFF REPAIR    . SHOULDER SURGERY    . TONSILLECTOMY      FAMHx:  Family History  Problem Relation Age of Onset  . Cancer Mother        colon, breast, pancreas, skin cancer  . Heart disease Father        heart valve replaced  . Stroke Father   . Diabetes Father   . Cancer Paternal Grandmother        colon  . Obesity Son   . Heart disease Son        bradycardia    SOCHx:   reports that he has never smoked. He has never used smokeless tobacco. He reports that he drinks about 0.6 oz of alcohol per week . He reports that he does not use drugs.  ALLERGIES:  Allergies  Allergen Reactions  . Penicillins Hives and Itching  . Verapamil     Junctional rhythm  . Influenza Vaccines Rash    ROS: Pertinent items noted in HPI and remainder of comprehensive ROS otherwise negative.  HOME MEDS: Current Outpatient Prescriptions on File Prior to Visit  Medication Sig Dispense Refill  . amLODipine (NORVASC) 5 MG tablet TAKE 1 TABLET BY MOUTH EVERY DAY 90 tablet 1  . apixaban (ELIQUIS) 2.5 MG TABS tablet Take 1 tablet (2.5 mg total) by mouth 2 (two) times daily. 60 tablet 2  . atorvastatin (LIPITOR) 20 MG tablet TAKE 1 TABLET BY MOUTH DAILY 90 tablet 3  . Calcium 600 MG tablet Take 1 tablet (600 mg  total) by mouth 2 (two) times daily. 60 tablet 0  . furosemide (LASIX) 20 MG tablet TAKE 1 TABLET (20 MG TOTAL) BY MOUTH DAILY AS NEEDED FOR EDEMA (WEIGHT GAIN >3LBS IN 24 HRS, SOB). 30 tablet 0  . glipiZIDE (GLUCOTROL) 10 MG tablet TAKE 2 TABLETS BY MOUTH TWICE DAILY BEFORE MEALS 360 tablet 1  . glucose blood (ONE TOUCH ULTRA TEST) test strip Use as twice daily to check blood sugar.  DX E11.9 100 each 6  . JANUVIA 100 MG tablet TAKE 1/2 TO 1 TABLET DAILY (Patient taking differently: TAKE 1 TABLET DAILY) 30 tablet 5  . Lancets (ONETOUCH ULTRASOFT) lancets Use as instructed to check blood sugar twice a day as needed Dx Code E11.9 100 each 6  . metFORMIN  (GLUCOPHAGE-XR) 500 MG 24 hr tablet TAKE 1 TABLET (500 MG TOTAL) BY MOUTH 2 (TWO) TIMES DAILY. 180 tablet 1  . pregabalin (LYRICA) 25 MG capsule Take 25 mg by mouth 3 (three) times daily. Take 2 tablets in the morning and 1 tablet in the evening.    . traMADol (ULTRAM) 50 MG tablet Take 1 tablet (50 mg total) by mouth every 6 (six) hours as needed for moderate pain. 15 tablet 0  . triamterene-hydrochlorothiazide (MAXZIDE) 75-50 MG tablet TAKE 1 TABLET BY MOUTH DAILY. 90 tablet 1   No current facility-administered medications on file prior to visit.     LABS/IMAGING: No results found for this or any previous visit (from the past 48 hour(s)). No results found.  LIPID PANEL:    Component Value Date/Time   CHOL 93 07/22/2016 0835   TRIG 112.0 07/22/2016 0835   HDL 33.30 (L) 07/22/2016 0835   CHOLHDL 3 07/22/2016 0835   VLDL 22.4 07/22/2016 0835   LDLCALC 38 07/22/2016 0835   LDLDIRECT 70.8 03/14/2014 1401     WEIGHTS: Wt Readings from Last 3 Encounters:  08/15/16 225 lb 6.4 oz (102.2 kg)  07/25/16 227 lb (103 kg)  07/15/16 228 lb (103.4 kg)    VITALS: BP 132/66 (BP Location: Left Arm, Patient Position: Sitting, Cuff Size: Normal)   Pulse (!) 52   Ht 5\' 8"  (1.727 m)   Wt 225 lb 6.4 oz (102.2 kg)   BMI 34.27 kg/m   EXAM: General appearance: alert and no distress Lungs: clear to auscultation bilaterally Heart: irregularly irregular rhythm and bradycardic Extremities: edema 1+ pitting edema Neurologic: Grossly normal  EKG: Atrial fibrillation/flutter with slow ventricular response at 44 - personally reviewed  ASSESSMENT: 1. Persistent atrial fibrillation/flutter with slow ventricular response 2. LBBB 3. Acute diastolic congestive heart failure 4. CKD3 5. CHADSVASC score 4-anticoagulated on Eliquis 2.5 mg twice a day  PLAN: 1.   Mr. Corkum continues to be in a persistent atrial flutter/fibrillation with slow ventricular response. Echo showed normal LV function with  moderate left atrial enlargement. He has an underlying left bundle branch block and with his built in conduction delay a very slow ventricular response. I think he benefit from being in sinus rhythm on concerned about bradycardia and the possible need for pacemaker. He's been on uninterrupted anticoagulation for more than 3 weeks. I would like to arrange for cardioversion. Plan to see him back in follow-up afterwards. He may need up titration of his diuretic as he continues to have 1+ pitting edema.  Pixie Casino, MD, [place]  Attending Cardiologist  Direct Dial: [phone]  Fax: [phone]  Website:  [email or web address] 08/15/2016, 9:54 AM

## 2016-08-15 NOTE — Patient Instructions (Addendum)
Your physician has recommended that you have a Cardioversion (DCCV) NEXT WEEK. Electrical Cardioversion uses a jolt of electricity to your heart either through paddles or wired patches attached to your chest. This is a controlled, usually prescheduled, procedure. Defibrillation is done under light anesthesia in the hospital, and you usually go home the day of the procedure. This is done to get your heart back into a normal rhythm. You are not awake for the procedure. Please see the instruction sheet given to you today.  Your physician recommends that you return for lab work TODAY  Your physician recommends that you schedule a follow-up appointment in: 3-4 weeks with Dr. Debara Pickett - after cardioversion

## 2016-08-19 ENCOUNTER — Encounter (HOSPITAL_COMMUNITY): Payer: Self-pay | Admitting: Anesthesiology

## 2016-08-19 NOTE — Anesthesia Preprocedure Evaluation (Addendum)
Anesthesia Evaluation  Patient identified by MRN, date of birth, ID band Patient awake    Reviewed: Allergy & Precautions, NPO status , Patient's Chart, lab work & pertinent test results  Airway Mallampati: II  TM Distance: >3 FB Neck ROM: Full    Dental  (+) Edentulous Upper, Edentulous Lower, Dental Advisory Given   Pulmonary neg pulmonary ROS,    Pulmonary exam normal        Cardiovascular hypertension, Pt. on medications + Peripheral Vascular Disease and +CHF  negative cardio ROS   Rhythm:Irregular Rate:Bradycardia     Neuro/Psych  Neuromuscular disease    GI/Hepatic Neg liver ROS, GERD  Controlled,  Endo/Other  negative endocrine ROSdiabetes  Renal/GU CRFRenal disease  negative genitourinary   Musculoskeletal negative musculoskeletal ROS (+)   Abdominal   Peds negative pediatric ROS (+)  Hematology negative hematology ROS (+)   Anesthesia Other Findings Day of surgery medications reviewed with the patient. - HLD  Reproductive/Obstetrics negative OB ROS                           Lab Results  Component Value Date   WBC 7.5 08/15/2016   HGB 12.8 (L) 07/22/2016   HCT 40.5 08/15/2016   MCV 81 08/15/2016   PLT 212 08/15/2016   Lab Results  Component Value Date   CREATININE 1.57 (H) 08/15/2016   BUN 32 (H) 08/15/2016   NA 135 08/15/2016   K 3.9 08/15/2016   CL 104 08/15/2016   CO2 23 08/15/2016   Lab Results  Component Value Date   INR 1.0 08/15/2016   INR 1.21 05/23/2014   EKG: atrial fibrillation.  Echo: - Left ventricle: The cavity size was normal. Wall thickness was normal. The estimated ejection fraction was 55%. Indeterminant diastolic function (atrial fibrillation). Although no diagnostic regional wall motion abnormality was identified, this possibility cannot be completely excluded on the basis of this study. - Aortic valve: There was no stenosis. - Mitral valve:  Mildly calcified annulus. Mildly calcified leaflets. There was mild regurgitation. - Left atrium: The atrium was moderately dilated. - Right ventricle: The cavity size was mildly dilated. Systolic function was normal. - Tricuspid valve: Peak RV-RA gradient (S): 46 mm Hg. - Pulmonary arteries: PA peak pressure: 54 mm Hg (S). - Systemic veins: IVC measured 2.1 cm with > 50% respirophasic   variation, suggesting RA pressure 8 mmHg.   Anesthesia Physical Anesthesia Plan  ASA: III  Anesthesia Plan: General   Post-op Pain Management:    Induction: Intravenous  PONV Risk Score and Plan: 0 and Propofol  Airway Management Planned: Natural Airway  Additional Equipment:   Intra-op Plan:   Post-operative Plan:   Informed Consent: I have reviewed the patients History and Physical, chart, labs and discussed the procedure including the risks, benefits and alternatives for the proposed anesthesia with the patient or authorized representative who has indicated his/her understanding and acceptance.     Plan Discussed with:   Anesthesia Plan Comments:         Anesthesia Quick Evaluation

## 2016-08-20 ENCOUNTER — Ambulatory Visit (HOSPITAL_COMMUNITY)
Admission: RE | Admit: 2016-08-20 | Discharge: 2016-08-20 | Disposition: A | Discharge status: Home / Self Care | Payer: PPO | Source: Ambulatory Visit | Attending: Cardiovascular Disease | Admitting: Cardiovascular Disease

## 2016-08-20 ENCOUNTER — Ambulatory Visit (HOSPITAL_COMMUNITY): Payer: PPO | Admitting: Anesthesiology

## 2016-08-20 ENCOUNTER — Encounter (HOSPITAL_COMMUNITY): Payer: Self-pay | Admitting: Emergency Medicine

## 2016-08-20 ENCOUNTER — Encounter (HOSPITAL_COMMUNITY)
Admission: RE | Disposition: A | Discharge status: Home / Self Care | Payer: Self-pay | Source: Ambulatory Visit | Attending: Cardiovascular Disease

## 2016-08-20 DIAGNOSIS — Z79899 Other long term (current) drug therapy: Secondary | ICD-10-CM | POA: Diagnosis not present

## 2016-08-20 DIAGNOSIS — E1122 Type 2 diabetes mellitus with diabetic chronic kidney disease: Secondary | ICD-10-CM | POA: Insufficient documentation

## 2016-08-20 DIAGNOSIS — Z7984 Long term (current) use of oral hypoglycemic drugs: Secondary | ICD-10-CM | POA: Diagnosis not present

## 2016-08-20 DIAGNOSIS — I447 Left bundle-branch block, unspecified: Secondary | ICD-10-CM | POA: Insufficient documentation

## 2016-08-20 DIAGNOSIS — I4891 Unspecified atrial fibrillation: Secondary | ICD-10-CM | POA: Diagnosis not present

## 2016-08-20 DIAGNOSIS — I130 Hypertensive heart and chronic kidney disease with heart failure and stage 1 through stage 4 chronic kidney disease, or unspecified chronic kidney disease: Secondary | ICD-10-CM | POA: Insufficient documentation

## 2016-08-20 DIAGNOSIS — I4892 Unspecified atrial flutter: Secondary | ICD-10-CM | POA: Diagnosis not present

## 2016-08-20 DIAGNOSIS — Z7901 Long term (current) use of anticoagulants: Secondary | ICD-10-CM | POA: Insufficient documentation

## 2016-08-20 DIAGNOSIS — E785 Hyperlipidemia, unspecified: Secondary | ICD-10-CM | POA: Diagnosis not present

## 2016-08-20 DIAGNOSIS — I5031 Acute diastolic (congestive) heart failure: Secondary | ICD-10-CM | POA: Insufficient documentation

## 2016-08-20 DIAGNOSIS — I480 Paroxysmal atrial fibrillation: Secondary | ICD-10-CM | POA: Insufficient documentation

## 2016-08-20 DIAGNOSIS — K219 Gastro-esophageal reflux disease without esophagitis: Secondary | ICD-10-CM | POA: Diagnosis not present

## 2016-08-20 DIAGNOSIS — N183 Chronic kidney disease, stage 3 (moderate): Secondary | ICD-10-CM | POA: Diagnosis not present

## 2016-08-20 DIAGNOSIS — I441 Atrioventricular block, second degree: Secondary | ICD-10-CM | POA: Insufficient documentation

## 2016-08-20 DIAGNOSIS — E1151 Type 2 diabetes mellitus with diabetic peripheral angiopathy without gangrene: Secondary | ICD-10-CM | POA: Insufficient documentation

## 2016-08-20 DIAGNOSIS — I10 Essential (primary) hypertension: Secondary | ICD-10-CM | POA: Diagnosis not present

## 2016-08-20 HISTORY — DX: Adverse effect of unspecified anesthetic, initial encounter: T41.45XA

## 2016-08-20 HISTORY — PX: CARDIOVERSION: SHX1299

## 2016-08-20 HISTORY — DX: Other complications of anesthesia, initial encounter: T88.59XA

## 2016-08-20 SURGERY — CARDIOVERSION
Anesthesia: General

## 2016-08-20 MED ORDER — LIDOCAINE HCL (CARDIAC) 20 MG/ML IV SOLN
INTRAVENOUS | Status: DC | PRN
  Administered 2016-08-20: 40 mg via INTRAVENOUS

## 2016-08-20 MED ORDER — PROPOFOL 10 MG/ML IV BOLUS
INTRAVENOUS | Status: DC | PRN
  Administered 2016-08-20: 60 mg via INTRAVENOUS

## 2016-08-20 MED ORDER — SODIUM CHLORIDE 0.9 % IV SOLN
INTRAVENOUS | Status: DC
  Administered 2016-08-20: 08:00:00 via INTRAVENOUS

## 2016-08-20 NOTE — Interval H&P Note (Signed)
History and Physical Interval Note:  08/20/2016 8:14 AM  Vincent Allen  has presented today for surgery, with the diagnosis of AFIB  The various methods of treatment have been discussed with the patient and family. After consideration of risks, benefits and other options for treatment, the patient has consented to  Procedure(s): CARDIOVERSION (N/A) as a surgical intervention .  The patient's history has been reviewed, patient examined, no change in status, stable for surgery.  I have reviewed the patient's chart and labs.  Questions were answered to the patient's satisfaction.     Steph Cheadle

## 2016-08-20 NOTE — H&P (View-Only) (Signed)
OFFICE FOLLOW-UP NOTE  Chief Complaint:  Follow-up atrial flutter  Primary Care Physician: [name], MD  HPI:  Vincent Allen is a 83 y.o. male with a past medial history significant for atrial fibrillation/flutter with slow ventricular response. Seen recently in the hospital after mechanical fall in the home. He has a history of paroxysmal atrial fibrillation in the past and was seen by Dr. Wynonia Lawman that was not on any anticoagulation. His CHADSVASC score is 4. He was started on Eliquis 2.5 mg twice a day due to age greater than 80 creatinine greater than 1.5. An echocardiogram was performed which showed normal systolic function, diastolic dysfunction moderate left atrial enlargement. I arranged for outpatient monitoring which shows persistent atrial fibrillation/flutter with controlled ventricular response and at times bradycardia but no clear long pauses or any other indications for pacemaker at this time. He reports being fatigued. He's been an active problem for more than 60 years and is interested in getting back to flying feels that his lack of energy is keeping him back from that.  PMHx:  Past Medical History:  Diagnosis Date  . Cataract   . Chronic kidney disease stage III (GFR 30-59 ml/min) 05/03/2012  . Colon polyps   . Diabetes mellitus   . GERD (gastroesophageal reflux disease)   . HTN (hypertension) 01/13/2013  . Hyperlipidemia   . Hypertension   . Irregular heartbeat   . Medicare annual wellness visit, subsequent 01/17/2013  . Preventative health care 01/17/2013  . Preventative health care 07/17/2015  . Vitamin D deficiency 07/17/2015    Past Surgical History:  Procedure Laterality Date  . ANKLE ARTHROPLASTY    . CATARACT EXTRACTION  2010, 2014  . CATARACT EXTRACTION  02/28/14  . CHOLECYSTECTOMY    . CHOLECYSTECTOMY N/A 05/03/2012   Procedure: LAPAROSCOPIC CHOLECYSTECTOMY WITH INTRAOPERATIVE CHOLANGIOGRAM;  Surgeon: [name], MD;  Location: [facility];  Service:  General;  Laterality: N/A;  . ROTATOR CUFF REPAIR    . SHOULDER SURGERY    . TONSILLECTOMY      FAMHx:  Family History  Problem Relation Age of Onset  . Cancer Mother        colon, breast, pancreas, skin cancer  . Heart disease Father        heart valve replaced  . Stroke Father   . Diabetes Father   . Cancer Paternal Grandmother        colon  . Obesity Son   . Heart disease Son        bradycardia    SOCHx:   reports that he has never smoked. He has never used smokeless tobacco. He reports that he drinks about 0.6 oz of alcohol per week . He reports that he does not use drugs.  ALLERGIES:  Allergies  Allergen Reactions  . Penicillins Hives and Itching  . Verapamil     Junctional rhythm  . Influenza Vaccines Rash    ROS: Pertinent items noted in HPI and remainder of comprehensive ROS otherwise negative.  HOME MEDS: Current Outpatient Prescriptions on File Prior to Visit  Medication Sig Dispense Refill  . amLODipine (NORVASC) 5 MG tablet TAKE 1 TABLET BY MOUTH EVERY DAY 90 tablet 1  . apixaban (ELIQUIS) 2.5 MG TABS tablet Take 1 tablet (2.5 mg total) by mouth 2 (two) times daily. 60 tablet 2  . atorvastatin (LIPITOR) 20 MG tablet TAKE 1 TABLET BY MOUTH DAILY 90 tablet 3  . Calcium 600 MG tablet Take 1 tablet (600 mg  total) by mouth 2 (two) times daily. 60 tablet 0  . furosemide (LASIX) 20 MG tablet TAKE 1 TABLET (20 MG TOTAL) BY MOUTH DAILY AS NEEDED FOR EDEMA (WEIGHT GAIN >3LBS IN 24 HRS, SOB). 30 tablet 0  . glipiZIDE (GLUCOTROL) 10 MG tablet TAKE 2 TABLETS BY MOUTH TWICE DAILY BEFORE MEALS 360 tablet 1  . glucose blood (ONE TOUCH ULTRA TEST) test strip Use as twice daily to check blood sugar.  DX E11.9 100 each 6  . JANUVIA 100 MG tablet TAKE 1/2 TO 1 TABLET DAILY (Patient taking differently: TAKE 1 TABLET DAILY) 30 tablet 5  . Lancets (ONETOUCH ULTRASOFT) lancets Use as instructed to check blood sugar twice a day as needed Dx Code E11.9 100 each 6  . metFORMIN  (GLUCOPHAGE-XR) 500 MG 24 hr tablet TAKE 1 TABLET (500 MG TOTAL) BY MOUTH 2 (TWO) TIMES DAILY. 180 tablet 1  . pregabalin (LYRICA) 25 MG capsule Take 25 mg by mouth 3 (three) times daily. Take 2 tablets in the morning and 1 tablet in the evening.    . traMADol (ULTRAM) 50 MG tablet Take 1 tablet (50 mg total) by mouth every 6 (six) hours as needed for moderate pain. 15 tablet 0  . triamterene-hydrochlorothiazide (MAXZIDE) 75-50 MG tablet TAKE 1 TABLET BY MOUTH DAILY. 90 tablet 1   No current facility-administered medications on file prior to visit.     LABS/IMAGING: No results found for this or any previous visit (from the past 48 hour(s)). No results found.  LIPID PANEL:    Component Value Date/Time   CHOL 93 07/22/2016 0835   TRIG 112.0 07/22/2016 0835   HDL 33.30 (L) 07/22/2016 0835   CHOLHDL 3 07/22/2016 0835   VLDL 22.4 07/22/2016 0835   LDLCALC 38 07/22/2016 0835   LDLDIRECT 70.8 03/14/2014 1401     WEIGHTS: Wt Readings from Last 3 Encounters:  08/15/16 225 lb 6.4 oz (102.2 kg)  07/25/16 227 lb (103 kg)  07/15/16 228 lb (103.4 kg)    VITALS: BP 132/66 (BP Location: Left Arm, Patient Position: Sitting, Cuff Size: Normal)   Pulse (!) 52   Ht 5\' 8"  (1.727 m)   Wt 225 lb 6.4 oz (102.2 kg)   BMI 34.27 kg/m   EXAM: General appearance: alert and no distress Lungs: clear to auscultation bilaterally Heart: irregularly irregular rhythm and bradycardic Extremities: edema 1+ pitting edema Neurologic: Grossly normal  EKG: Atrial fibrillation/flutter with slow ventricular response at 44 - personally reviewed  ASSESSMENT: 1. Persistent atrial fibrillation/flutter with slow ventricular response 2. LBBB 3. Acute diastolic congestive heart failure 4. CKD3 5. CHADSVASC score 4-anticoagulated on Eliquis 2.5 mg twice a day  PLAN: 1.   [name] continues to be in a persistent atrial flutter/fibrillation with slow ventricular response. Echo showed normal LV function with  moderate left atrial enlargement. He has an underlying left bundle branch block and with his built in conduction delay a very slow ventricular response. I think he benefit from being in sinus rhythm on concerned about bradycardia and the possible need for pacemaker. He's been on uninterrupted anticoagulation for more than 3 weeks. I would like to arrange for cardioversion. Plan to see him back in follow-up afterwards. He may need up titration of his diuretic as he continues to have 1+ pitting edema.  Pixie Casino, MD, [place]  Attending Cardiologist  Direct Dial: [phone]  Fax: [phone]  Website:  [email or web address] [email or web address] 08/15/2016, 9:54 AM

## 2016-08-20 NOTE — Op Note (Signed)
Procedure: Electrical Cardioversion Indications:  Atrial Fibrillation  Procedure Details:  Consent: Risks of procedure as well as the alternatives and risks of each were explained to the (patient/caregiver).  Consent for procedure obtained.  Time Out: Verified patient identification, verified procedure, site/side was marked, verified correct patient position, special equipment/implants available, medications/allergies/relevent history reviewed, required imaging and test results available.  Performed  Patient placed on cardiac monitor, pulse oximetry, supplemental oxygen as necessary.  Sedation given: IV propofol 60 mg IV, Dr. Smith Prajwal Pacer pads placed anterior and posterior chest.  Cardioverted 2 time(s).  Cardioversion with synchronized biphasic 120J shock (unsuccessful), 150J (successful)  Evaluation: Findings: Post procedure EKG shows: sinus bradycardia, first degree AV block, occ second degree AV block, Mobitz I Complications: None Patient did tolerate procedure well.  Time Spent Directly with the Patient:  30 minutes   Mirtha Jain 08/20/2016, 8:14 AM

## 2016-08-20 NOTE — Anesthesia Procedure Notes (Signed)
Date/Time: 08/20/2016 8:01 AM Performed by: Jenne Campus Pre-anesthesia Checklist: Patient identified, Emergency Drugs available, Suction available, Patient being monitored and Timeout performed Patient Re-evaluated:Patient Re-evaluated prior to inductionOxygen Delivery Method: Ambu bag

## 2016-08-20 NOTE — Anesthesia Postprocedure Evaluation (Signed)
Anesthesia Post Note  Patient: Vincent Allen  Procedure(s) Performed: Procedure(s) (LRB): CARDIOVERSION (N/A)     Patient location during evaluation: PACU Anesthesia Type: General Level of consciousness: awake and alert Pain management: pain level controlled Vital Signs Assessment: post-procedure vital signs reviewed and stable Respiratory status: spontaneous breathing, nonlabored ventilation, respiratory function stable and patient connected to nasal cannula oxygen Cardiovascular status: blood pressure returned to baseline and stable Postop Assessment: no signs of nausea or vomiting Anesthetic complications: no    Last Vitals:  Vitals:   08/20/16 0825 08/20/16 0835  BP: (!) 134/51 (!) 145/51  Pulse: (!) 56 62  Resp: 15 16  Temp:      Last Pain:  Vitals:   08/20/16 0816  TempSrc: Oral                 Effie Berkshire

## 2016-08-20 NOTE — Transfer of Care (Signed)
Immediate Anesthesia Transfer of Care Note  Patient: Vincent Allen  Procedure(s) Performed: Procedure(s): CARDIOVERSION (N/A)  Patient Location: PACU  Anesthesia Type:General  Level of Consciousness: awake, oriented and patient cooperative  Airway & Oxygen Therapy: Patient Spontanous Breathing and Patient connected to nasal cannula oxygen  Post-op Assessment: Report given to RN and Post -op Vital signs reviewed and stable  Post vital signs: Reviewed  Last Vitals:  Vitals:   08/20/16 0705 08/20/16 0801  BP: (!) 145/43 (!) 153/52  Pulse: (!) 52   Resp: 18 14  Temp: 36.8 C     Last Pain:  Vitals:   08/20/16 0705  TempSrc: Oral         Complications: No apparent anesthesia complications

## 2016-08-23 ENCOUNTER — Telehealth: Payer: Self-pay | Admitting: Internal Medicine

## 2016-08-23 NOTE — Telephone Encounter (Signed)
New message    Pt is calling stating his pule is 55 this morning. And he believes it is missing a beat again.

## 2016-08-23 NOTE — Telephone Encounter (Signed)
He did have Mobitz type I second-degree AV block and I suspect that's why his heart rate is relatively slow and that's why he feels an occasional missed beat (he has intermittently blocked AV conduction every 3rd-4th beat). The typical complaint with this would be fatigue; if the level of block increases he may develop near syncope or syncope. If syncope does occur, he should come to the emergency room.  As far as I can tell from reviewing his medicines he is not taking any medication that could worsen this situation. There is also no medication that can help it. If a slow heartbeat continues to bother him, the treatment might be a pacemaker. Unless he develops worsening complaints, probably best to just keep the appointment with Dr. Debara Pickett on June 28.

## 2016-08-23 NOTE — Telephone Encounter (Signed)
Patient notified of MD advice. He voiced understanding of s/sx to be mindful of and knows when to seek emergency evaluation.

## 2016-08-23 NOTE — Telephone Encounter (Signed)
Returned call to patient He states he didn't feel too good yesterday HR was 51bpm last night HR 53bpm this AM Feels like it is missing a beat - like every 3rd or 4th beat Denies chest pain, denies shortness of breath Post-DCCV 6/5 performed by Dr. Sallyanne Kuster (note below) Cardioverted 2 time(s).  Cardioversion with synchronized biphasic 120J shock (unsuccessful), 150J (successful)  Evaluation: Findings: Post procedure EKG shows: sinus bradycardia, first degree AV block, occ second degree AV block, Mobitz I   Explained that sometimes the heart can go back out of rhythm after cardioversion unfortunately  Will defer to Dr. Sallyanne Kuster for advice - sees Dr. Debara Pickett on 6/28 for follow up

## 2016-08-26 ENCOUNTER — Ambulatory Visit (INDEPENDENT_AMBULATORY_CARE_PROVIDER_SITE_OTHER): Payer: PPO | Admitting: Podiatry

## 2016-08-26 ENCOUNTER — Encounter: Payer: Self-pay | Admitting: Podiatry

## 2016-08-26 DIAGNOSIS — M79604 Pain in right leg: Secondary | ICD-10-CM

## 2016-08-26 DIAGNOSIS — B351 Tinea unguium: Secondary | ICD-10-CM | POA: Diagnosis not present

## 2016-08-26 DIAGNOSIS — M79676 Pain in unspecified toe(s): Secondary | ICD-10-CM

## 2016-08-26 DIAGNOSIS — M79605 Pain in left leg: Secondary | ICD-10-CM

## 2016-08-26 NOTE — Progress Notes (Signed)
Subjective:    Patient ID: Vincent Allen, male   DOB: 83 y.o.   MRN: [id number]   HPI patient presents with elongated thickened nails 1-5 both feet that are moderately painful when pressed and makes shoe gear difficult    ROS      Objective:  Physical Exam neurovascular status unchanged with thick yellow brittle nailbeds 1-5 both feet     Assessment:  Mycotic nail infection with pain 1-5 both feet       Plan:   Debride painful nailbeds 1-5 both feet with no iatrogenic bleeding noted

## 2016-09-02 ENCOUNTER — Encounter (INDEPENDENT_AMBULATORY_CARE_PROVIDER_SITE_OTHER): Payer: Self-pay | Admitting: Orthopaedic Surgery

## 2016-09-02 ENCOUNTER — Ambulatory Visit (INDEPENDENT_AMBULATORY_CARE_PROVIDER_SITE_OTHER): Payer: PPO | Admitting: Orthopaedic Surgery

## 2016-09-02 VITALS — BP 164/74 | HR 53 | Ht 68.0 in | Wt 225.0 lb

## 2016-09-02 DIAGNOSIS — G8929 Other chronic pain: Secondary | ICD-10-CM

## 2016-09-02 DIAGNOSIS — M25562 Pain in left knee: Secondary | ICD-10-CM

## 2016-09-02 MED ORDER — BUPIVACAINE HCL 0.5 % IJ SOLN
3.0000 mL | INTRAMUSCULAR | Status: AC | PRN
  Administered 2016-09-02: 3 mL via INTRA_ARTICULAR

## 2016-09-02 MED ORDER — METHYLPREDNISOLONE ACETATE 40 MG/ML IJ SUSP
80.0000 mg | INTRAMUSCULAR | Status: AC | PRN
  Administered 2016-09-02: 80 mg

## 2016-09-02 MED ORDER — LIDOCAINE HCL 1 % IJ SOLN
5.0000 mL | INTRAMUSCULAR | Status: AC | PRN
  Administered 2016-09-02: 5 mL

## 2016-09-02 NOTE — Progress Notes (Signed)
Office Visit Note   Patient: Vincent Allen           Date of Birth: 02-05-1934           MRN: [id number] Visit Date: 09/02/2016              Requested by: [name], MD [address] [place], [place] [address] PCP: [name], MD   Assessment & Plan: Visit Diagnoses:  1. Chronic pain of left knee   Osteoarthritis-moderate to advanced  Plan: Repeat cortisone injection, precert for Visco supplementation  Follow-Up Instructions: Return if symptoms worsen or fail to improve.   Orders:  No orders of the defined types were placed in this encounter.  No orders of the defined types were placed in this encounter.     Procedures: Large Joint Inj Date/Time: 09/02/2016 2:02 PM Performed by: [name] Authorized by: [name]   Consent Given by:  Patient Timeout: prior to procedure the correct patient, procedure, and site was verified   Indications:  Pain and joint swelling Location:  Knee Site:  L knee Prep: patient was prepped and draped in usual sterile fashion   Needle Size:  25 G Needle Length:  1.5 inches Approach:  Anteromedial Ultrasound Guidance: No   Fluoroscopic Guidance: No   Arthrogram: No   Medications:  5 mL lidocaine 1 %; 80 mg methylPREDNISolone acetate 40 MG/ML; 3 mL bupivacaine 0.5 % Aspiration Attempted: No   Patient tolerance:  Patient tolerated the procedure well with no immediate complications     Clinical Data: No additional findings.   Subjective: Chief Complaint  Patient presents with  . Left Knee - Pain    Vincent Allen is an 83 y o that presents with chronic LEFT knee pain. He relates the cortisone injection. Last one 05/07/16 and lasted 3 months.  Vincent Allen has near end-stage osteoarthritis of his left knee. We've been treating him nonoperatively with episodic cortisone injections. The injections last "about 3-5 months. Prior films also demonstrate diffuse calcification within the menisci  consistent with pseudogout  HPI  Review of Systems   Objective: Vital Signs: BP (!) 164/74   Pulse (!) 53   Ht 5\' 8"  (1.727 m)   Wt 225 lb (102.1 kg)   BMI 34.21 kg/m   Physical Exam  Ortho Exam moderate limp related to left knee pain. Small knee effusion. Predominantly medial joint pain. Some patellar crepitation. No instability.  Specialty Comments:  No specialty comments available.  Imaging: No results found.   PMFS History: Patient Active Problem List   Diagnosis Date Noted  . Atrial flutter ([facility]) 08/15/2016  . Other fatigue 08/15/2016  . Acute diastolic CHF (congestive heart failure) ([facility]) 08/15/2016  . Bradycardia 07/09/2016  . Atrial fibrillation with slow ventricular response ([facility]) 07/09/2016  . Vitamin D deficiency 07/17/2015  . Preventative health care 07/17/2015  . Lumbago 12/02/2014  . Acute subdural hematoma ([facility]) 05/20/2014  . Fracture closed, nasal bone 05/19/2014  . Subdural hematoma ([facility]) 05/19/2014  . Nasal fracture 05/19/2014  . Pain of toe of left foot 03/14/2014  . Tinea pedis of left foot 03/14/2014  . Diabetes mellitus type 2 in obese ([facility]) 12/12/2013  . Medicare annual wellness visit, subsequent 01/17/2013  . Essential hypertension 01/13/2013  . Chronic kidney disease stage III (GFR 30-59 ml/min) 05/03/2012  . Insomnia 06/27/2011  . Osteopenia 09/02/2010  . Family history of colon cancer 09/02/2010  . Diabetes mellitus with chronic  kidney disease ([facility])   . Hypertensive heart disease   . Hyperlipidemia   . Carotid artery disease ([facility])   . GERD (gastroesophageal reflux disease)   . CTS (carpal tunnel syndrome) 08/28/2010   Past Medical History:  Diagnosis Date  . Cataract   . Chronic kidney disease stage III (GFR 30-59 ml/min) 05/03/2012  . Colon polyps   . Complication of anesthesia    emesis  . Diabetes mellitus   . GERD (gastroesophageal reflux disease)   . HTN (hypertension) 01/13/2013  . Hyperlipidemia   . Hypertension   .  Irregular heartbeat   . Medicare annual wellness visit, subsequent 01/17/2013  . Preventative health care 01/17/2013  . Preventative health care 07/17/2015  . Vitamin D deficiency 07/17/2015    Family History  Problem Relation Age of Onset  . Cancer Mother        colon, breast, pancreas, skin cancer  . Heart disease Father        heart valve replaced  . Stroke Father   . Diabetes Father   . Cancer Paternal Grandmother        colon  . Obesity Son   . Heart disease Son        bradycardia    Past Surgical History:  Procedure Laterality Date  . ANKLE ARTHROPLASTY    . CARDIOVERSION N/A 08/20/2016   Procedure: CARDIOVERSION;  Surgeon: Sanda Klein, MD;  Location: [facility];  Service: Cardiovascular;  Laterality: N/A;  . CATARACT EXTRACTION  2010, 2014  . CATARACT EXTRACTION  02/28/14  . CHOLECYSTECTOMY    . CHOLECYSTECTOMY N/A 05/03/2012   Procedure: LAPAROSCOPIC CHOLECYSTECTOMY WITH INTRAOPERATIVE CHOLANGIOGRAM;  Surgeon: [name], MD;  Location: [facility];  Service: General;  Laterality: N/A;  . ROTATOR CUFF REPAIR    . SHOULDER SURGERY    . TONSILLECTOMY     Social History   Occupational History  . Not on file.   Social History Main Topics  . Smoking status: Never Smoker  . Smokeless tobacco: Never Used  . Alcohol use 0.6 oz/week    1 Glasses of wine per week  . Drug use: No  . 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.     [name], MD   Note - This record has been created using [employer].  Chart creation errors have been sought, but may not always  have been located. Such creation errors do not reflect on  the standard of medical care.

## 2016-09-03 ENCOUNTER — Telehealth: Payer: Self-pay | Admitting: *Deleted

## 2016-09-03 NOTE — Telephone Encounter (Signed)
Please call patient and schedule Euflexxa inj. X 3 with Aaron Edelman on Wednesday's, buy and bill, Left knee only. Thank you.

## 2016-09-04 ENCOUNTER — Ambulatory Visit (INDEPENDENT_AMBULATORY_CARE_PROVIDER_SITE_OTHER): Payer: PPO | Admitting: Orthopedic Surgery

## 2016-09-04 DIAGNOSIS — L821 Other seborrheic keratosis: Secondary | ICD-10-CM | POA: Diagnosis not present

## 2016-09-04 DIAGNOSIS — D1801 Hemangioma of skin and subcutaneous tissue: Secondary | ICD-10-CM | POA: Diagnosis not present

## 2016-09-04 DIAGNOSIS — L814 Other melanin hyperpigmentation: Secondary | ICD-10-CM | POA: Diagnosis not present

## 2016-09-04 DIAGNOSIS — D692 Other nonthrombocytopenic purpura: Secondary | ICD-10-CM | POA: Diagnosis not present

## 2016-09-04 DIAGNOSIS — L570 Actinic keratosis: Secondary | ICD-10-CM | POA: Diagnosis not present

## 2016-09-04 DIAGNOSIS — Z85828 Personal history of other malignant neoplasm of skin: Secondary | ICD-10-CM | POA: Diagnosis not present

## 2016-09-10 ENCOUNTER — Other Ambulatory Visit: Payer: Self-pay | Admitting: Family Medicine

## 2016-09-11 ENCOUNTER — Ambulatory Visit (INDEPENDENT_AMBULATORY_CARE_PROVIDER_SITE_OTHER): Payer: PPO | Admitting: Orthopedic Surgery

## 2016-09-11 ENCOUNTER — Encounter (INDEPENDENT_AMBULATORY_CARE_PROVIDER_SITE_OTHER): Payer: Self-pay | Admitting: Orthopedic Surgery

## 2016-09-11 VITALS — BP 171/76 | HR 48 | Ht 72.0 in | Wt 225.0 lb

## 2016-09-11 DIAGNOSIS — M1712 Unilateral primary osteoarthritis, left knee: Secondary | ICD-10-CM | POA: Diagnosis not present

## 2016-09-11 MED ORDER — SODIUM HYALURONATE (VISCOSUP) 20 MG/2ML IX SOSY
20.0000 mg | PREFILLED_SYRINGE | INTRA_ARTICULAR | Status: AC | PRN
  Administered 2016-09-11: 20 mg via INTRA_ARTICULAR

## 2016-09-11 MED ORDER — LIDOCAINE HCL 1 % IJ SOLN
3.0000 mL | INTRAMUSCULAR | Status: AC | PRN
  Administered 2016-09-11: 3 mL

## 2016-09-11 NOTE — Progress Notes (Signed)
Office Visit Note   Patient: Vincent Allen           Date of Birth: [birth year]           MRN: [id number] Visit Date: 09/11/2016              Requested by: [name], MD [address] [place], [place] [address] PCP: [name], MD   Assessment & Plan: Visit Diagnoses:  1. Unilateral primary osteoarthritis, left knee     Plan:  #1: First Euflex injection was given to the left knee without difficulty #2: Follow back up with [place] in 1 week for his second Euflex injection  Follow-Up Instructions: Return in about 2 weeks (around 09/25/2016).   Orders:  Orders Placed This Encounter  Procedures  . Large Joint Injection/Arthrocentesis   No orders of the defined types were placed in this encounter.     Procedures: Large Joint Inj Date/Time: 09/11/2016 9:57 AM Performed by: Biagio Borg D Authorized by: Biagio Borg D   Consent Given by:  Patient Timeout: prior to procedure the correct patient, procedure, and site was verified   Indications:  Pain and joint swelling Location:  Knee Site:  L knee Prep: patient was prepped and draped in usual sterile fashion   Needle Size:  25 G Needle Length:  1.5 inches Approach:  Anteromedial Ultrasound Guidance: No   Fluoroscopic Guidance: No   Arthrogram: No   Medications:  20 mg Sodium Hyaluronate 20 MG/2ML; 3 mL lidocaine 1 % Aspiration Attempted: No   Patient tolerance:  Patient tolerated the procedure well with no immediate complications     Clinical Data: No additional findings.   Subjective: Chief Complaint  Patient presents with  . Left Knee - Pain  . Injections    Vincent Allen seen today for evaluation of his left knee which has near end-stage osteoarthritis. Is also been noted to have calcium pyrophosphate deposition in the menisci consistent with pseudogout. He's been having injections of cortisone in the knee lasting about 3-5 months at times. He continues to have pain and  discomfort in the knee. His most recently had a corticosteroid injection to the knee. He returns today for consideration of [place] supplementation.      Review of Systems  Cardiovascular: Positive for palpitations.       History of A. fib/flutter  Genitourinary:       History of stage III CKD     Objective: Vital Signs: BP (!) 171/76 (BP Location: Left Arm, Patient Position: Sitting, Cuff Size: Normal)   Pulse (!) 48   Ht 6' (1.829 m)   Wt 225 lb (102.1 kg)   BMI 30.52 kg/m   Physical Exam  Constitutional: He is oriented to person, place, and time. He appears well-developed and well-nourished.  HENT:  Head: Normocephalic and atraumatic.  Eyes: EOM are normal. Pupils are equal, round, and reactive to light.  Pulmonary/Chest: Effort normal.  Neurological: He is alert and oriented to person, place, and time.  Skin: Skin is warm and dry.  Psychiatric: He has a normal mood and affect. His behavior is normal. Judgment and thought content normal.    Ortho Exam  Today he is knee reveals range of motion from about 5 to 100 of motion. Does have some patellofemoral crepitus with range of motion. Trace effusion. Tender more on the medial joint line. A little pseudo-laxity but with an excellent endpoint with valgus stressing.  Specialty Comments:  No  specialty comments available.  Imaging: No results found.  X-rays reviewed from previous encounter noted chondrocalcinosis calcium pyrophosphate crystals throughout the joint as well as degenerative joint disease.  PMFS History: Patient Active Problem List   Diagnosis Date Noted  . Atrial flutter ([place]) 08/15/2016  . Other fatigue 08/15/2016  . Acute diastolic CHF (congestive heart failure) ([place]) 08/15/2016  . Bradycardia 07/09/2016  . Atrial fibrillation with slow ventricular response ([place]) 07/09/2016  . Vitamin D deficiency 07/17/2015  . Preventative health care 07/17/2015  . Lumbago 12/02/2014  . Acute subdural hematoma ([place])  05/20/2014  . Fracture closed, nasal bone 05/19/2014  . Subdural hematoma ([place]) 05/19/2014  . Nasal fracture 05/19/2014  . Pain of toe of left foot 03/14/2014  . Tinea pedis of left foot 03/14/2014  . Diabetes mellitus type 2 in obese ([place]) 12/12/2013  . Medicare annual wellness visit, subsequent 01/17/2013  . Essential hypertension 01/13/2013  . Chronic kidney disease stage III (GFR 30-59 ml/min) 05/03/2012  . Insomnia 06/27/2011  . Osteopenia 09/02/2010  . Family history of colon cancer 09/02/2010  . Diabetes mellitus with chronic kidney disease ([place])   . Hypertensive heart disease   . Hyperlipidemia   . Carotid artery disease ([place])   . GERD (gastroesophageal reflux disease)   . CTS (carpal tunnel syndrome) 08/28/2010   Past Medical History:  Diagnosis Date  . Cataract   . Chronic kidney disease stage III (GFR 30-59 ml/min) 05/03/2012  . Colon polyps   . Complication of anesthesia    emesis  . Diabetes mellitus   . GERD (gastroesophageal reflux disease)   . HTN (hypertension) 01/13/2013  . Hyperlipidemia   . Hypertension   . Irregular heartbeat   . Medicare annual wellness visit, subsequent 01/17/2013  . Preventative health care 01/17/2013  . Preventative health care 07/17/2015  . Vitamin D deficiency 07/17/2015    Family History  Problem Relation Age of Onset  . Cancer Mother        colon, breast, pancreas, skin cancer  . Heart disease Father        heart valve replaced  . Stroke Father   . Diabetes Father   . Cancer Paternal Grandmother        colon  . Obesity Son   . Heart disease Son        bradycardia    Past Surgical History:  Procedure Laterality Date  . ANKLE ARTHROPLASTY    . CARDIOVERSION N/A 08/20/2016   Procedure: CARDIOVERSION;  Surgeon: Sanda Klein, MD;  Location: [facility];  Service: Cardiovascular;  Laterality: N/A;  . CATARACT EXTRACTION  2010, 2014  . CATARACT EXTRACTION  02/28/14  . CHOLECYSTECTOMY    . CHOLECYSTECTOMY N/A 05/03/2012    Procedure: LAPAROSCOPIC CHOLECYSTECTOMY WITH INTRAOPERATIVE CHOLANGIOGRAM;  Surgeon: [name], MD;  Location: [facility];  Service: General;  Laterality: N/A;  . ROTATOR CUFF REPAIR    . SHOULDER SURGERY    . TONSILLECTOMY     Social History   Occupational History  . Not on file.   Social History Main Topics  . Smoking status: Never Smoker  . Smokeless tobacco: Never Used  . Alcohol use 0.6 oz/week    1 Glasses of wine per week  . Drug use: No  . 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.

## 2016-09-12 ENCOUNTER — Ambulatory Visit (INDEPENDENT_AMBULATORY_CARE_PROVIDER_SITE_OTHER): Payer: PPO | Admitting: Internal Medicine

## 2016-09-12 ENCOUNTER — Encounter: Payer: Self-pay | Admitting: Internal Medicine

## 2016-09-12 VITALS — BP 168/82 | HR 40 | Ht 72.0 in | Wt 223.0 lb

## 2016-09-12 DIAGNOSIS — I5031 Acute diastolic (congestive) heart failure: Secondary | ICD-10-CM | POA: Diagnosis not present

## 2016-09-12 DIAGNOSIS — I4892 Unspecified atrial flutter: Secondary | ICD-10-CM

## 2016-09-12 DIAGNOSIS — R001 Bradycardia, unspecified: Secondary | ICD-10-CM | POA: Diagnosis not present

## 2016-09-12 NOTE — Progress Notes (Signed)
OFFICE FOLLOW-UP NOTE  Chief Complaint:  Follow-up cardioversion  Primary Care Physician: [name], MD  HPI:  Vincent Allen is a 83 y.o. male with a past medial history significant for atrial fibrillation/flutter with slow ventricular response. Seen recently in the hospital after mechanical fall in the home. He has a history of paroxysmal atrial fibrillation in the past and was seen by Dr. Wynonia Lawman that was not on any anticoagulation. His CHADSVASC score is 4. He was started on Eliquis 2.5 mg twice a day due to age greater than 80 creatinine greater than 1.5. An echocardiogram was performed which showed normal systolic function, diastolic dysfunction moderate left atrial enlargement. I arranged for outpatient monitoring which shows persistent atrial fibrillation/flutter with controlled ventricular response and at times bradycardia but no clear long pauses or any other indications for pacemaker at this time. He reports being fatigued. He's been an active problem for more than 60 years and is interested in getting back to [place] feels that his lack of energy is keeping him back from that.  09/12/2016  Vincent Allen returns for follow-up today. He underwent recent a cardioversion by Dr. Sallyanne Kuster. This was successful and he reported feeling a little better with heart rates in sinus in the 50s for about a week, however then started to feel more fatigued. He's not been on any AV nodal blocking medications. He reports compliance with Eliquis. EKG today shows atrial fibrillation with a marked bradycardic response of 40. Notes that his heart rate will not necessarily increase with any exertion.  PMHx:  Past Medical History:  Diagnosis Date  . Cataract   . Chronic kidney disease stage III (GFR 30-59 ml/min) 05/03/2012  . Colon polyps   . Complication of anesthesia    emesis  . Diabetes mellitus   . GERD (gastroesophageal reflux disease)   . HTN (hypertension) 01/13/2013  . Hyperlipidemia   .  Hypertension   . Irregular heartbeat   . Medicare annual wellness visit, subsequent 01/17/2013  . Preventative health care 01/17/2013  . Preventative health care 07/17/2015  . Vitamin D deficiency 07/17/2015    Past Surgical History:  Procedure Laterality Date  . ANKLE ARTHROPLASTY    . CARDIOVERSION N/A 08/20/2016   Procedure: CARDIOVERSION;  Surgeon: Sanda Klein, MD;  Location: [facility];  Service: Cardiovascular;  Laterality: N/A;  . CATARACT EXTRACTION  2010, 2014  . CATARACT EXTRACTION  02/28/14  . CHOLECYSTECTOMY    . CHOLECYSTECTOMY N/A 05/03/2012   Procedure: LAPAROSCOPIC CHOLECYSTECTOMY WITH INTRAOPERATIVE CHOLANGIOGRAM;  Surgeon: [name], MD;  Location: [facility];  Service: General;  Laterality: N/A;  . ROTATOR CUFF REPAIR    . SHOULDER SURGERY    . TONSILLECTOMY      FAMHx:  Family History  Problem Relation Age of Onset  . Cancer Mother        colon, breast, pancreas, skin cancer  . Heart disease Father        heart valve replaced  . Stroke Father   . Diabetes Father   . Cancer Paternal Grandmother        colon  . Obesity Son   . Heart disease Son        bradycardia    SOCHx:   reports that he has never smoked. He has never used smokeless tobacco. He reports that he drinks about 0.6 oz of alcohol per week . He reports that he does not use drugs.  ALLERGIES:  Allergies  Allergen Reactions  . Penicillins Hives  and Itching  . Verapamil     Junctional rhythm  . Influenza Vaccines Rash    ROS: Pertinent items noted in HPI and remainder of comprehensive ROS otherwise negative.  HOME MEDS: Current Outpatient Prescriptions on File Prior to Visit  Medication Sig Dispense Refill  . amLODipine (NORVASC) 5 MG tablet TAKE 1 TABLET BY MOUTH EVERY DAY 90 tablet 1  . apixaban (ELIQUIS) 2.5 MG TABS tablet Take 1 tablet (2.5 mg total) by mouth 2 (two) times daily. 60 tablet 2  . atorvastatin (LIPITOR) 20 MG tablet TAKE 1 TABLET BY MOUTH DAILY 90 tablet 3  .  Calcium 600 MG tablet Take 1 tablet (600 mg total) by mouth 2 (two) times daily. 60 tablet 0  . furosemide (LASIX) 20 MG tablet TAKE 1 TABLET (20 MG TOTAL) BY MOUTH DAILY AS NEEDED FOR EDEMA (WEIGHT GAIN >3LBS IN 24 HRS, SOB). 30 tablet 0  . glipiZIDE (GLUCOTROL) 10 MG tablet TAKE 2 TABLETS BY MOUTH TWICE DAILY BEFORE MEALS 360 tablet 1  . glucose blood (ONE TOUCH ULTRA TEST) test strip Use as twice daily to check blood sugar.  DX E11.9 100 each 6  . JANUVIA 100 MG tablet TAKE 1/2 TO 1 TABLET DAILY (Patient taking differently: TAKE 1 TABLET DAILY) 30 tablet 5  . Lancets (ONETOUCH ULTRASOFT) lancets Use as instructed to check blood sugar twice a day as needed Dx Code E11.9 100 each 6  . metFORMIN (GLUCOPHAGE-XR) 500 MG 24 hr tablet TAKE 1 TABLET (500 MG TOTAL) BY MOUTH 2 (TWO) TIMES DAILY. 180 tablet 1  . pregabalin (LYRICA) 25 MG capsule Take 25 mg by mouth 3 (three) times daily. Take 2 tablets in the morning and 1 tablet in the evening.    . traMADol (ULTRAM) 50 MG tablet Take 1 tablet (50 mg total) by mouth every 6 (six) hours as needed for moderate pain. 15 tablet 0  . triamterene-hydrochlorothiazide (MAXZIDE) 75-50 MG tablet TAKE 1 TABLET BY MOUTH DAILY. 90 tablet 1   No current facility-administered medications on file prior to visit.     LABS/IMAGING: No results found for this or any previous visit (from the past 48 hour(s)). No results found.  LIPID PANEL:    Component Value Date/Time   CHOL 93 07/22/2016 0835   TRIG 112.0 07/22/2016 0835   HDL 33.30 (L) 07/22/2016 0835   CHOLHDL 3 07/22/2016 0835   VLDL 22.4 07/22/2016 0835   LDLCALC 38 07/22/2016 0835   LDLDIRECT 70.8 03/14/2014 1401     WEIGHTS: Wt Readings from Last 3 Encounters:  09/12/16 223 lb (101.2 kg)  09/11/16 225 lb (102.1 kg)  09/02/16 225 lb (102.1 kg)    VITALS: BP (!) 168/82 (BP Location: Left Arm, Patient Position: Sitting, Cuff Size: Normal)   Pulse (!) 40   Ht 6' (1.829 m)   Wt 223 lb (101.2 kg)    BMI 30.24 kg/m   EXAM: General appearance: alert and no distress Neck: no carotid bruit, no JVD and thyroid not enlarged, symmetric, no tenderness/mass/nodules Lungs: clear to auscultation bilaterally Heart: Irregular bradycardia Abdomen: soft, non-tender; bowel sounds normal; no masses,  no organomegaly Extremities: edema 1+ pitting edema Pulses: 2+ and symmetric Skin: Skin color, texture, turgor normal. No rashes or lesions Neurologic: Grossly normal Psych: Frustrated  EKG: Atrial fibrillation/flutter with slow ventricular response at 40 - personally reviewed  ASSESSMENT: 1. Persistent atrial fibrillation/flutter with slow ventricular response - failed cardioversion 2. LBBB 3. Acute diastolic congestive heart failure 4. CKD3 5. CHADSVASC  score 4-anticoagulated on Eliquis 2.5 mg twice a day  PLAN: 1.   Vincent Allen continues to feel fatigued however reported mild improvement in his symptoms after cardioversion for less than a week and then reverted back to his bradycardic A. fib. He does have significant underlying conduction delay with an IVCD/left bundle branch block. Given his persistent symptoms and low heart rate on no AV nodal blocking medications, I feel that he be a good candidate for permanent pacemaker. Unfortunate is currently on Eliquis. I reached out to Dr. Caryl Comes with electrophysiology to see if he could be seen also by tomorrow in the office and if he agrees to proceed with a pacemaker we could consider holding his Eliquis over the weekend and scheduling an early next week.  Follow-up with me afterwards.  Pixie Casino, MD, [place]  Attending Cardiologist  Direct Dial: [phone]  Fax: [phone]  Website:  [email or web address]  Nadean Corwin Gift Rueckert 09/12/2016, 10:20 AM

## 2016-09-12 NOTE — Patient Instructions (Addendum)
Medication Instructions:  Your physician recommends that you continue on your current medications as directed. Please refer to the Current Medication list given to you today.  Labwork: NONE  Testing/Procedures: NONE  Follow-Up: Your physician recommends that you schedule a follow-up appointment in: [name] have been referred to ELECTROPHYSIOLOGY, [name]   If you need a refill on your cardiac medications before your next appointment, please call your pharmacy.

## 2016-09-13 ENCOUNTER — Ambulatory Visit (INDEPENDENT_AMBULATORY_CARE_PROVIDER_SITE_OTHER): Payer: PPO | Admitting: Internal Medicine

## 2016-09-13 ENCOUNTER — Encounter: Payer: Self-pay | Admitting: Internal Medicine

## 2016-09-13 ENCOUNTER — Encounter: Payer: Self-pay | Admitting: *Deleted

## 2016-09-13 ENCOUNTER — Telehealth: Payer: Self-pay | Admitting: Internal Medicine

## 2016-09-13 VITALS — BP 126/62 | HR 48 | Ht 72.0 in | Wt 229.4 lb

## 2016-09-13 DIAGNOSIS — I481 Persistent atrial fibrillation: Secondary | ICD-10-CM | POA: Diagnosis not present

## 2016-09-13 DIAGNOSIS — Z01812 Encounter for preprocedural laboratory examination: Secondary | ICD-10-CM | POA: Diagnosis not present

## 2016-09-13 DIAGNOSIS — R001 Bradycardia, unspecified: Secondary | ICD-10-CM

## 2016-09-13 DIAGNOSIS — I4819 Other persistent atrial fibrillation: Secondary | ICD-10-CM

## 2016-09-13 NOTE — Telephone Encounter (Signed)
New message   Alvis Lemmings, Dr Olin Pia nurse called - needs EKG scanned into the system before appointment this afternoon, please.

## 2016-09-13 NOTE — Patient Instructions (Signed)
Medication Instructions: - Your physician recommends that you continue on your current medications as directed. Please refer to the Current Medication list given to you today.  Labwork: - Your physician recommends that you have lab work today: BMP/CBC  Procedures/Testing: - Your physician has recommended that you have a pacemaker inserted. A pacemaker is a small device that is placed under the skin of your chest or abdomen to help control abnormal heart rhythms. This device uses electrical pulses to prompt the heart to beat at a normal rate. Pacemakers are used to treat heart rhythms that are too slow. Wire (leads) are attached to the pacemaker that goes into the chambers of you heart. This is done in the hospital and usually requires and overnight stay. Please see the instruction sheet given to you today for more information.  Follow-Up: - Your physician recommends that you schedule a follow-up appointment in: about 14 days (from 09/20/16) for a wound check appointment with the [facility] nurse.  - Your physician recommends that you schedule a follow-up appointment in: at least 91 days (from 09/20/16) with Dr. Caryl Comes.    Any Additional Special Instructions Will Be Listed Below (If Applicable).     If you need a refill on your cardiac medications before your next appointment, please call your pharmacy.

## 2016-09-13 NOTE — Progress Notes (Signed)
ELECTROPHYSIOLOGY CONSULT NOTE  Patient ID: Vincent Allen, MRN: [id number], DOB/AGE: 81-Feb-1935 81 y.o. Admit date: (Not on file) Date of Consult: 09/13/2016  Primary Physician: [name], MD Primary Cardiologist: Cammie Mcgee is being seen today for the evaluation of bradycardia at the request of Dr Debara Pickett   HPI Vincent Allen is a 83 y.o. male  Seen at the request Dr Debara Pickett because of a slow ventricular response w atrial fibrillation   He undergone cardioversion a couple of weeks ago following the detection of atrial fibrillation with a slow ventricular response. His post cardioversion ECG demonstrated an increased heart rate about 65 with left bundle branch block. He felt better for number of days with improved energy confirmed by his son. He has had progressive edema over recent weeks.  His blood pressure monitor has recorded heart rates in the low 40s the last week or so.  Treated with apixoban; he has had no bleeding issues.  He has dyspnea on exertion but no chest pain. He has no known coronary artery disease.  ECG from 2016 demonstrated sinus rhythm.  Thromboembolic risk factors ( age - -2, HTN-1, DM-1, Vasc disease -1) for a CHADSVASc Score of 5       Past Medical History:  Diagnosis Date  . Cataract   . Chronic kidney disease stage III (GFR 30-59 ml/min) 05/03/2012  . Colon polyps   . Complication of anesthesia    emesis  . Diabetes mellitus   . GERD (gastroesophageal reflux disease)   . HTN (hypertension) 01/13/2013  . Hyperlipidemia   . Hypertension   . Irregular heartbeat   . Medicare annual wellness visit, subsequent 01/17/2013  . Preventative health care 01/17/2013  . Preventative health care 07/17/2015  . Vitamin D deficiency 07/17/2015      Surgical History:  Past Surgical History:  Procedure Laterality Date  . ANKLE ARTHROPLASTY    . CARDIOVERSION N/A 08/20/2016   Procedure: CARDIOVERSION;  Surgeon: Sanda Tatum Corl, MD;   Location: [facility];  Service: Cardiovascular;  Laterality: N/A;  . CATARACT EXTRACTION  2010, 2014  . CATARACT EXTRACTION  02/28/14  . CHOLECYSTECTOMY    . CHOLECYSTECTOMY N/A 05/03/2012   Procedure: LAPAROSCOPIC CHOLECYSTECTOMY WITH INTRAOPERATIVE CHOLANGIOGRAM;  Surgeon: [name], MD;  Location: [facility];  Service: General;  Laterality: N/A;  . ROTATOR CUFF REPAIR    . SHOULDER SURGERY    . TONSILLECTOMY       Home Meds: Prior to Admission medications   Medication Sig Start Date End Date Taking? Authorizing Provider  amLODipine (NORVASC) 5 MG tablet TAKE 1 TABLET BY MOUTH EVERY DAY 07/08/16  Yes [name], MD  apixaban (ELIQUIS) 2.5 MG TABS tablet Take 1 tablet (2.5 mg total) by mouth 2 (two) times daily. 08/14/16  Yes [name], MD  atorvastatin (LIPITOR) 20 MG tablet TAKE 1 TABLET BY MOUTH DAILY 06/05/16  Yes [name], MD  Calcium 600 MG tablet Take 1 tablet (600 mg total) by mouth 2 (two) times daily. 01/30/11  Yes Hodgin, Mora Appl, MD  furosemide (LASIX) 20 MG tablet TAKE 1 TABLET (20 MG TOTAL) BY MOUTH DAILY AS NEEDED FOR EDEMA (WEIGHT GAIN >3LBS IN 24 HRS, SOB). 09/10/16  Yes [name], MD  glipiZIDE (GLUCOTROL) 10 MG tablet TAKE 2 TABLETS BY MOUTH TWICE DAILY BEFORE MEALS 04/08/16  Yes [name] A, MD  glucose blood (ONE TOUCH ULTRA TEST) test strip Use as twice  daily to check blood sugar.  DX E11.9 11/07/15  Yes [name], MD  JANUVIA 100 MG tablet TAKE 1/2 TO 1 TABLET DAILY Patient taking differently: TAKE 1 TABLET DAILY 12/22/15  Yes [name], MD  Lancets [facility] [place] ULTRASOFT) lancets Use as instructed to check blood sugar twice a day as needed Dx Code E11.9 11/07/15  Yes [name], MD  metFORMIN (GLUCOPHAGE-XR) 500 MG 24 hr tablet TAKE 1 TABLET (500 MG TOTAL) BY MOUTH 2 (TWO) TIMES DAILY. 07/30/16  Yes [name], MD  pregabalin (LYRICA) 25 MG capsule Take 25 mg by mouth 3 (three) times daily. Take 2 tablets in the morning and 1  tablet in the evening.   Yes [provider]  traMADol (ULTRAM) 50 MG tablet Take 1 tablet (50 mg total) by mouth every 6 (six) hours as needed for moderate pain. 07/10/16  Yes Tat, Shanon Brow, MD  triamterene-hydrochlorothiazide (MAXZIDE) 75-50 MG tablet TAKE 1 TABLET BY MOUTH DAILY. 07/08/16  Yes [name], MD    Allergies:  Allergies  Allergen Reactions  . Penicillins Hives and Itching  . Verapamil     Junctional rhythm  . Influenza Vaccines Rash    Social History   Social History  . Marital status: Divorced    Spouse name: N/A  . Number of children: N/A  . Years of education: N/A   Occupational History  . Not on file.   Social History Main Topics  . Smoking status: Never Smoker  . Smokeless tobacco: Never Used  . Alcohol use 0.6 oz/week    1 Glasses of wine per week  . Drug use: No  . 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.   Other Topics Concern  . Not on file   Social History Narrative  . No narrative on file     Family History  Problem Relation Age of Onset  . Cancer Mother        colon, breast, pancreas, skin cancer  . Heart disease Father        heart valve replaced  . Stroke Father   . Diabetes Father   . Cancer Paternal Grandmother        colon  . Obesity Son   . Heart disease Son        bradycardia     ROS:  Please see the history of present illness.     All other systems reviewed and negative.    Physical Exam: Blood pressure 126/62, pulse (!) 48, height 6' (1.829 m), weight 229 lb 6.4 oz (104.1 kg). General: Well developed, Morbidly obese  male in no acute distress. Head: Normocephalic, atraumatic, sclera non-icteric, no xanthomas, nares are without discharge. EENT: normal  Lymph Nodes:  none Neck: Negative for carotid bruits. JVD not elevated. Back:without scoliosis kyphosis Lungs: Clear bilaterally to auscultation without  wheezes, rales, or rhonchi. Breathing is unlabored. Heart: Irregularly irregular rate and rhythm with a normal S1 and S2 and a 2/6 systolic murmur . No rubs, or gallops appreciated. Abdomen: Soft, non-tender, non-distended with normoactive bowel sounds. No hepatomegaly. No rebound/guarding. No obvious abdominal masses. Msk:  Strength and tone appear normal for age. Extremities: No clubbing or cyanosis. 2-3+ edema.  Distal pedal pulses are 2+ and equal bilaterally. Skin: Warm and Dry Neuro: Alert and oriented X 3. CN III-XII intact Grossly normal sensory and motor function .  Psych:  Responds to questions appropriately with a normal affect.      Labs: Cardiac Enzymes No results for input(s): CKTOTAL, CKMB, TROPONINI in the last 72 hours. CBC Lab Results  Component Value Date   WBC 7.5 08/15/2016   HGB 13.8 08/15/2016   HCT 40.5 08/15/2016   MCV 81 08/15/2016   PLT 212 08/15/2016   PROTIME: No results for input(s): LABPROT, INR in the last 72 hours. Chemistry No results for input(s): NA, K, CL, CO2, BUN, CREATININE, CALCIUM, PROT, BILITOT, ALKPHOS, ALT, AST, GLUCOSE in the last 168 hours.  Invalid input(s): LABALBU Lipids Lab Results  Component Value Date   CHOL 93 07/22/2016   HDL 33.30 (L) 07/22/2016   LDLCALC 38 07/22/2016   TRIG 112.0 07/22/2016   BNP No results found for: PROBNP Thyroid Function Tests: No results for input(s): TSH, T4TOTAL, T3FREE, THYROIDAB in the last 72 hours.  Invalid input(s): FREET3 Miscellaneous No results found for: DDIMER  Radiology/Studies:  No results found.  EKG: Atrial fibrillation at 48 Intervals-/14/45 Nonspecific IVCD  ECG post cardioversion June 5 sinus rhythm at 64 Intervals 28/exterior/47 with a nonspecific IVCD  ECG 28 June atrial fibrillation rate of 40  Assessment and Plan:   Atrial fibrillation-persistent slow ventricular response  Dyspnea on exertion   First-degree AV block  Congestive heart failure diastolic  acute/chronic   The patient has had persistent atrial fibrillation interestingly, in the context of his conduction system disease, ventricular rates are slower with atrial fibrillation and we are in sinus rhythm. Improvement in functional status with sinus rhythm and therefore could be related to improve rate or regaining of AV synchrony. He and his son are both first to more medications, hence, we had decided upon a strategy of addressing the symptomatic bradycardia first with pacing and then his symptoms do not abate sufficiently proceeding at that point with antiarrhythmic drug therapy to restore and hopefully maintaining sinus rhythm to regain the AV synchrony.  He has evidence of congestive failure. We will increase his Lasix to take it daily as opposed as needed.  The benefits and risks were reviewed including but not limited to death,  perforation, infection, lead dislodgement and device malfunction.  The patient understands agrees and is willing to proceed.     [name]

## 2016-09-13 NOTE — Telephone Encounter (Signed)
Medical records notified

## 2016-09-13 NOTE — Telephone Encounter (Signed)
Given to [employer]

## 2016-09-14 LAB — CBC WITH DIFFERENTIAL/PLATELET
BASOS ABS: 0 10*3/uL (ref 0.0–0.2)
BASOS: 0 %
EOS (ABSOLUTE): 0.1 10*3/uL (ref 0.0–0.4)
Eos: 1 %
HEMOGLOBIN: 13.2 g/dL (ref 13.0–17.7)
Hematocrit: 41.8 % (ref 37.5–51.0)
IMMATURE GRANS (ABS): 0.1 10*3/uL (ref 0.0–0.1)
IMMATURE GRANULOCYTES: 1 %
LYMPHS: 22 %
Lymphocytes Absolute: 2.8 10*3/uL (ref 0.7–3.1)
MCH: 27 pg (ref 26.6–33.0)
MCHC: 31.6 g/dL (ref 31.5–35.7)
MCV: 86 fL (ref 79–97)
MONOCYTES: 8 %
Monocytes Absolute: 1.1 10*3/uL — ABNORMAL HIGH (ref 0.1–0.9)
NEUTROS ABS: 8.7 10*3/uL — AB (ref 1.4–7.0)
Neutrophils: 68 %
Platelets: 210 10*3/uL (ref 150–379)
RBC: 4.89 x10E6/uL (ref 4.14–5.80)
RDW: 14.5 % (ref 12.3–15.4)
WBC: 12.9 10*3/uL — ABNORMAL HIGH (ref 3.4–10.8)

## 2016-09-14 LAB — BASIC METABOLIC PANEL
BUN/Creatinine Ratio: 16 (ref 10–24)
BUN: 24 mg/dL (ref 8–27)
CALCIUM: 9.1 mg/dL (ref 8.6–10.2)
CHLORIDE: 105 mmol/L (ref 96–106)
CO2: 21 mmol/L (ref 20–29)
Creatinine, Ser: 1.54 mg/dL — ABNORMAL HIGH (ref 0.76–1.27)
GFR, EST AFRICAN AMERICAN: 48 mL/min/{1.73_m2} — AB (ref >59)
GFR, EST NON AFRICAN AMERICAN: 42 mL/min/{1.73_m2} — AB (ref >59)
Glucose: 63 mg/dL — ABNORMAL LOW (ref 65–99)
Potassium: 3.9 mmol/L (ref 3.5–5.2)
Sodium: 141 mmol/L (ref 134–144)

## 2016-09-20 ENCOUNTER — Ambulatory Visit (HOSPITAL_COMMUNITY)
Admission: RE | Disposition: A | Discharge status: Home / Self Care | Payer: Self-pay | Source: Ambulatory Visit | Attending: Internal Medicine

## 2016-09-20 ENCOUNTER — Encounter (HOSPITAL_COMMUNITY): Payer: Self-pay | Admitting: Nurse Practitioner

## 2016-09-20 ENCOUNTER — Ambulatory Visit (HOSPITAL_COMMUNITY)
Admission: RE | Admit: 2016-09-20 | Discharge: 2016-09-21 | Disposition: A | Discharge status: Home / Self Care | Payer: PPO | Source: Ambulatory Visit | Attending: Internal Medicine | Admitting: Internal Medicine

## 2016-09-20 DIAGNOSIS — Z887 Allergy status to serum and vaccine status: Secondary | ICD-10-CM | POA: Diagnosis not present

## 2016-09-20 DIAGNOSIS — E785 Hyperlipidemia, unspecified: Secondary | ICD-10-CM | POA: Diagnosis not present

## 2016-09-20 DIAGNOSIS — N183 Chronic kidney disease, stage 3 (moderate): Secondary | ICD-10-CM | POA: Diagnosis not present

## 2016-09-20 DIAGNOSIS — I481 Persistent atrial fibrillation: Secondary | ICD-10-CM | POA: Diagnosis not present

## 2016-09-20 DIAGNOSIS — I495 Sick sinus syndrome: Secondary | ICD-10-CM | POA: Insufficient documentation

## 2016-09-20 DIAGNOSIS — Z79899 Other long term (current) drug therapy: Secondary | ICD-10-CM | POA: Diagnosis not present

## 2016-09-20 DIAGNOSIS — I5033 Acute on chronic diastolic (congestive) heart failure: Secondary | ICD-10-CM | POA: Insufficient documentation

## 2016-09-20 DIAGNOSIS — Z01818 Encounter for other preprocedural examination: Secondary | ICD-10-CM | POA: Insufficient documentation

## 2016-09-20 DIAGNOSIS — Z880 Allergy status to penicillin: Secondary | ICD-10-CM | POA: Insufficient documentation

## 2016-09-20 DIAGNOSIS — R001 Bradycardia, unspecified: Secondary | ICD-10-CM | POA: Diagnosis present

## 2016-09-20 DIAGNOSIS — E1122 Type 2 diabetes mellitus with diabetic chronic kidney disease: Secondary | ICD-10-CM | POA: Insufficient documentation

## 2016-09-20 DIAGNOSIS — Z959 Presence of cardiac and vascular implant and graft, unspecified: Secondary | ICD-10-CM

## 2016-09-20 DIAGNOSIS — K219 Gastro-esophageal reflux disease without esophagitis: Secondary | ICD-10-CM | POA: Insufficient documentation

## 2016-09-20 DIAGNOSIS — I130 Hypertensive heart and chronic kidney disease with heart failure and stage 1 through stage 4 chronic kidney disease, or unspecified chronic kidney disease: Secondary | ICD-10-CM | POA: Insufficient documentation

## 2016-09-20 DIAGNOSIS — Z7901 Long term (current) use of anticoagulants: Secondary | ICD-10-CM | POA: Insufficient documentation

## 2016-09-20 DIAGNOSIS — Z950 Presence of cardiac pacemaker: Secondary | ICD-10-CM

## 2016-09-20 HISTORY — DX: Bradycardia, unspecified: R00.1

## 2016-09-20 HISTORY — DX: Presence of cardiac pacemaker: Z95.0

## 2016-09-20 HISTORY — DX: Unspecified hearing loss, unspecified ear: H91.90

## 2016-09-20 HISTORY — DX: Other persistent atrial fibrillation: I48.19

## 2016-09-20 HISTORY — DX: Unspecified osteoarthritis, unspecified site: M19.90

## 2016-09-20 HISTORY — PX: INSERT / REPLACE / REMOVE PACEMAKER: SUR710

## 2016-09-20 HISTORY — PX: PACEMAKER IMPLANT: EP1218

## 2016-09-20 LAB — GLUCOSE, CAPILLARY
GLUCOSE-CAPILLARY: 106 mg/dL — AB (ref 65–99)
GLUCOSE-CAPILLARY: 63 mg/dL — AB (ref 65–99)
GLUCOSE-CAPILLARY: 136 mg/dL — AB (ref 65–99)
Glucose-Capillary: 308 mg/dL — ABNORMAL HIGH (ref 65–99)

## 2016-09-20 LAB — SURGICAL PCR SCREEN
MRSA, PCR: NEGATIVE
STAPHYLOCOCCUS AUREUS: NEGATIVE

## 2016-09-20 SURGERY — PACEMAKER IMPLANT

## 2016-09-20 MED ORDER — NAPHAZOLINE-GLYCERIN 0.012-0.2 % OP SOLN
1.0000 [drp] | Freq: Three times a day (TID) | OPHTHALMIC | Status: DC | PRN
  Filled 2016-09-20: qty 15

## 2016-09-20 MED ORDER — LIDOCAINE HCL (PF) 1 % IJ SOLN
INTRAMUSCULAR | Status: DC | PRN
  Administered 2016-09-20: 45 mL

## 2016-09-20 MED ORDER — SODIUM CHLORIDE 0.9 % IV SOLN
INTRAVENOUS | Status: DC
  Administered 2016-09-20: 14:00:00 via INTRAVENOUS

## 2016-09-20 MED ORDER — YOU HAVE A PACEMAKER BOOK
Freq: Once | Status: AC
  Administered 2016-09-20: 20:00:00
  Filled 2016-09-20: qty 1

## 2016-09-20 MED ORDER — VANCOMYCIN HCL IN DEXTROSE 1-5 GM/200ML-% IV SOLN
1000.0000 mg | INTRAVENOUS | Status: AC
  Administered 2016-09-20: 1000 mg via INTRAVENOUS

## 2016-09-20 MED ORDER — ATORVASTATIN CALCIUM 20 MG PO TABS
20.0000 mg | ORAL_TABLET | Freq: Every day | ORAL | Status: DC
  Administered 2016-09-20: 20 mg via ORAL
  Filled 2016-09-20: qty 1

## 2016-09-20 MED ORDER — DIPHENHYDRAMINE HCL 50 MG/ML IJ SOLN
INTRAMUSCULAR | Status: AC
  Filled 2016-09-20: qty 1

## 2016-09-20 MED ORDER — VANCOMYCIN HCL IN DEXTROSE 1-5 GM/200ML-% IV SOLN
1000.0000 mg | Freq: Two times a day (BID) | INTRAVENOUS | Status: AC
  Administered 2016-09-21: 02:00:00 1000 mg via INTRAVENOUS
  Filled 2016-09-20: qty 200

## 2016-09-20 MED ORDER — METHYLPREDNISOLONE SODIUM SUCC 125 MG IJ SOLR
INTRAMUSCULAR | Status: DC | PRN
  Administered 2016-09-20: 125 mg via INTRAVENOUS

## 2016-09-20 MED ORDER — METHYLPREDNISOLONE SODIUM SUCC 125 MG IJ SOLR
INTRAMUSCULAR | Status: AC
  Filled 2016-09-20: qty 2

## 2016-09-20 MED ORDER — VANCOMYCIN HCL IN DEXTROSE 1-5 GM/200ML-% IV SOLN
INTRAVENOUS | Status: AC
  Filled 2016-09-20: qty 200

## 2016-09-20 MED ORDER — SODIUM CHLORIDE 0.9 % IR SOLN
Status: AC
  Filled 2016-09-20: qty 2

## 2016-09-20 MED ORDER — SODIUM CHLORIDE 0.9 % IR SOLN: 80.0000 mg | Status: DC

## 2016-09-20 MED ORDER — SODIUM CHLORIDE 0.9 % IV SOLN: INTRAVENOUS | Status: AC

## 2016-09-20 MED ORDER — ONDANSETRON HCL 4 MG/2ML IJ SOLN: 4.0000 mg | Freq: Four times a day (QID) | INTRAMUSCULAR | Status: DC | PRN

## 2016-09-20 MED ORDER — CALCIUM 600 MG PO TABS: 600.0000 mg | ORAL_TABLET | Freq: Two times a day (BID) | ORAL | Status: DC

## 2016-09-20 MED ORDER — DEXTROSE 50 % IV SOLN
INTRAVENOUS | Status: AC
  Filled 2016-09-20: qty 50

## 2016-09-20 MED ORDER — VANCOMYCIN HCL IN DEXTROSE 1-5 GM/200ML-% IV SOLN
INTRAVENOUS | Status: AC
Start: 2016-09-20 — End: ?
  Filled 2016-09-20: qty 200

## 2016-09-20 MED ORDER — DEXTROSE 50 % IV SOLN
12.5000 g | Freq: Once | INTRAVENOUS | Status: AC
  Administered 2016-09-20: 12.5 g via INTRAVENOUS

## 2016-09-20 MED ORDER — ACETAMINOPHEN 325 MG PO TABS: 325.0000 mg | ORAL_TABLET | ORAL | Status: DC | PRN

## 2016-09-20 MED ORDER — CALCIUM CARBONATE 1250 (500 CA) MG PO TABS
1.0000 | ORAL_TABLET | Freq: Two times a day (BID) | ORAL | Status: DC
  Administered 2016-09-20 – 2016-09-21 (×2): 500 mg via ORAL
  Filled 2016-09-20 (×2): qty 1

## 2016-09-20 MED ORDER — FAMOTIDINE IN NACL 20-0.9 MG/50ML-% IV SOLN
INTRAVENOUS | Status: DC | PRN
  Administered 2016-09-20: 20 mg via INTRAVENOUS

## 2016-09-20 MED ORDER — AMLODIPINE BESYLATE 5 MG PO TABS
5.0000 mg | ORAL_TABLET | Freq: Every day | ORAL | Status: DC
  Administered 2016-09-21: 5 mg via ORAL
  Filled 2016-09-20: qty 1

## 2016-09-20 MED ORDER — LINAGLIPTIN 5 MG PO TABS
5.0000 mg | ORAL_TABLET | Freq: Every day | ORAL | Status: DC
  Administered 2016-09-20 – 2016-09-21 (×2): 5 mg via ORAL
  Filled 2016-09-20 (×2): qty 1

## 2016-09-20 MED ORDER — GLIPIZIDE 10 MG PO TABS
10.0000 mg | ORAL_TABLET | Freq: Two times a day (BID) | ORAL | Status: DC
  Administered 2016-09-20 – 2016-09-21 (×2): 10 mg via ORAL
  Filled 2016-09-20 (×2): qty 1

## 2016-09-20 MED ORDER — POLYVINYL ALCOHOL 1.4 % OP SOLN
1.0000 [drp] | Freq: Three times a day (TID) | OPHTHALMIC | Status: DC | PRN
  Filled 2016-09-20: qty 15

## 2016-09-20 MED ORDER — CHLORHEXIDINE GLUCONATE 4 % EX LIQD: 60.0000 mL | Freq: Once | CUTANEOUS | Status: DC

## 2016-09-20 MED ORDER — CEFAZOLIN SODIUM-DEXTROSE 2-3 GM-% IV SOLR
INTRAVENOUS | Status: DC | PRN
  Administered 2016-09-20: 2 g via INTRAVENOUS

## 2016-09-20 MED ORDER — METFORMIN HCL ER 500 MG PO TB24
500.0000 mg | ORAL_TABLET | Freq: Two times a day (BID) | ORAL | Status: DC
  Administered 2016-09-21: 500 mg via ORAL
  Filled 2016-09-20: qty 1

## 2016-09-20 MED ORDER — LIDOCAINE HCL (PF) 1 % IJ SOLN
INTRAMUSCULAR | Status: AC
  Filled 2016-09-20: qty 60

## 2016-09-20 MED ORDER — HEPARIN (PORCINE) IN NACL 2-0.9 UNIT/ML-% IJ SOLN
INTRAMUSCULAR | Status: AC
  Filled 2016-09-20: qty 500

## 2016-09-20 MED ORDER — FAMOTIDINE IN NACL 20-0.9 MG/50ML-% IV SOLN
INTRAVENOUS | Status: AC
  Filled 2016-09-20: qty 50

## 2016-09-20 MED ORDER — MUPIROCIN 2 % EX OINT
TOPICAL_OINTMENT | Freq: Once | CUTANEOUS | Status: AC
  Administered 2016-09-20: 1 via NASAL

## 2016-09-20 MED ORDER — POLYETHYL GLYCOL-PROPYL GLYCOL 0.4-0.3 % OP SOLN
1.0000 [drp] | Freq: Three times a day (TID) | OPHTHALMIC | Status: DC | PRN
Start: 2016-09-20 — End: 2016-09-20

## 2016-09-20 MED ORDER — HEPARIN (PORCINE) IN NACL 2-0.9 UNIT/ML-% IJ SOLN
INTRAMUSCULAR | Status: AC | PRN
  Administered 2016-09-20: 500 mL

## 2016-09-20 MED ORDER — MUPIROCIN 2 % EX OINT
TOPICAL_OINTMENT | CUTANEOUS | Status: AC
  Filled 2016-09-20: qty 22

## 2016-09-20 MED ORDER — TRIAMTERENE-HCTZ 75-50 MG PO TABS
1.0000 | ORAL_TABLET | Freq: Every day | ORAL | Status: DC
  Administered 2016-09-21: 09:00:00 1 via ORAL
  Filled 2016-09-20: qty 1

## 2016-09-20 SURGICAL SUPPLY — 9 items
CABLE SURGICAL S-101-97-12 (CABLE) ×2 IMPLANT
HEMOSTAT SURGICEL 2X4 FIBR (HEMOSTASIS) ×2 IMPLANT
IPG PACE AZUR XT DR MRI W1DR01 (Pacemaker) IMPLANT
LEAD CAPSURE NOVUS 5076-52CM (Lead) ×2 IMPLANT
LEAD CAPSURE NOVUS 5076-58CM (Lead) ×3 IMPLANT
PACE AZURE XT DR MRI W1DR01 (Pacemaker) ×3 IMPLANT
PAD DEFIB LIFELINK (PAD) ×2 IMPLANT
SHEATH CLASSIC 7F (SHEATH) ×6 IMPLANT
TRAY PACEMAKER INSERTION (PACKS) ×2 IMPLANT

## 2016-09-20 NOTE — Interval H&P Note (Signed)
History and Physical Interval Note:  09/20/2016 3:48 PM  Vincent Allen  has presented today for surgery, with the diagnosis of snd  The various methods of treatment have been discussed with the patient and family. After consideration of risks, benefits and other options for treatment, the patient has consented to  Procedure(s): Pacemaker Implant (N/A) as a surgical intervention .  The patient's history has been reviewed, patient examined, no change in status, stable for surgery.  I have reviewed the patient's chart and labs.  Questions were answered to the patient's satisfaction.     [name] Reviewed with family

## 2016-09-20 NOTE — Discharge Instructions (Signed)
° ° °  Supplemental Discharge Instructions for  Pacemaker/Defibrillator Patients  Activity No heavy lifting or vigorous activity with your left/right arm for 6 to 8 weeks.  Do not raise your left/right arm above your head for one week.  Gradually raise your affected arm as drawn below.           __      09/24/16                    09/25/16                    09/26/16                   09/27/16  NO DRIVING for  1 week   ; you may begin driving on  2/50/53   .  WOUND CARE - Keep the wound area clean and dry.   - No bandage is needed on the site.  DO  NOT apply any creams, oils, or ointments to the wound area. - If you notice any drainage or discharge from the wound, any swelling or bruising at the site, or you develop a fever > 101? F after you are discharged home, call the office at once.  Special Instructions - You are still able to use cellular telephones; use the ear opposite the side where you have your pacemaker/defibrillator.  Avoid carrying your cellular phone near your device. - When traveling through airports, show security personnel your identification card to avoid being screened in the metal detectors.  Ask the security personnel to use the hand wand. - Avoid arc welding equipment, MRI testing (magnetic resonance imaging), TENS units (transcutaneous nerve stimulators).  Call the office for questions about other devices. - Avoid electrical appliances that are in poor condition or are not properly grounded. - Microwave ovens are safe to be near or to operate.

## 2016-09-20 NOTE — H&P (View-Only) (Signed)
ELECTROPHYSIOLOGY CONSULT NOTE  Patient ID: Vincent Allen, MRN: [id number], DOB/AGE: 08/21/1933 83 y.o. Admit date: (Not on file) Date of Consult: 09/13/2016  Primary Physician: [name], MD Primary Cardiologist: Cammie Mcgee is being seen today for the evaluation of bradycardia at the request of Dr Debara Pickett   HPI Vincent Allen is a 83 y.o. male  Seen at the request Dr Debara Pickett because of a slow ventricular response w atrial fibrillation   He undergone cardioversion a couple of weeks ago following the detection of atrial fibrillation with a slow ventricular response. His post cardioversion ECG demonstrated an increased heart rate about 65 with left bundle branch block. He felt better for number of days with improved energy confirmed by his son. He has had progressive edema over recent weeks.  His blood pressure monitor has recorded heart rates in the low 40s the last week or so.  Treated with apixoban; he has had no bleeding issues.  He has dyspnea on exertion but no chest pain. He has no known coronary artery disease.  ECG from 2016 demonstrated sinus rhythm.  Thromboembolic risk factors ( age - -2, HTN-1, DM-1, Vasc disease -1) for a CHADSVASc Score of 5       Past Medical History:  Diagnosis Date  . Cataract   . Chronic kidney disease stage III (GFR 30-59 ml/min) 05/03/2012  . Colon polyps   . Complication of anesthesia    emesis  . Diabetes mellitus   . GERD (gastroesophageal reflux disease)   . HTN (hypertension) 01/13/2013  . Hyperlipidemia   . Hypertension   . Irregular heartbeat   . Medicare annual wellness visit, subsequent 01/17/2013  . Preventative health care 01/17/2013  . Preventative health care 07/17/2015  . Vitamin D deficiency 07/17/2015      Surgical History:  Past Surgical History:  Procedure Laterality Date  . ANKLE ARTHROPLASTY    . CARDIOVERSION N/A 08/20/2016   Procedure: CARDIOVERSION;  Surgeon: Sanda Dyshawn Cangelosi, MD;   Location: [facility];  Service: Cardiovascular;  Laterality: N/A;  . CATARACT EXTRACTION  2010, 2014  . CATARACT EXTRACTION  02/28/14  . CHOLECYSTECTOMY    . CHOLECYSTECTOMY N/A 05/03/2012   Procedure: LAPAROSCOPIC CHOLECYSTECTOMY WITH INTRAOPERATIVE CHOLANGIOGRAM;  Surgeon: [name], MD;  Location: [facility];  Service: General;  Laterality: N/A;  . ROTATOR CUFF REPAIR    . SHOULDER SURGERY    . TONSILLECTOMY       Home Meds: Prior to Admission medications   Medication Sig Start Date End Date Taking? Authorizing Provider  amLODipine (NORVASC) 5 MG tablet TAKE 1 TABLET BY MOUTH EVERY DAY 07/08/16  Yes [name], MD  apixaban (ELIQUIS) 2.5 MG TABS tablet Take 1 tablet (2.5 mg total) by mouth 2 (two) times daily. 08/14/16  Yes [name], MD  atorvastatin (LIPITOR) 20 MG tablet TAKE 1 TABLET BY MOUTH DAILY 06/05/16  Yes [name], MD  Calcium 600 MG tablet Take 1 tablet (600 mg total) by mouth 2 (two) times daily. 01/30/11  Yes Hodgin, Mora Appl, MD  furosemide (LASIX) 20 MG tablet TAKE 1 TABLET (20 MG TOTAL) BY MOUTH DAILY AS NEEDED FOR EDEMA (WEIGHT GAIN >3LBS IN 24 HRS, SOB). 09/10/16  Yes [name], MD  glipiZIDE (GLUCOTROL) 10 MG tablet TAKE 2 TABLETS BY MOUTH TWICE DAILY BEFORE MEALS 04/08/16  Yes [name] A, MD  glucose blood (ONE TOUCH ULTRA TEST) test strip Use as twice  daily to check blood sugar.  DX E11.9 11/07/15  Yes [name], MD  JANUVIA 100 MG tablet TAKE 1/2 TO 1 TABLET DAILY Patient taking differently: TAKE 1 TABLET DAILY 12/22/15  Yes [name], MD  Lancets [facility] ULTRASOFT) lancets Use as instructed to check blood sugar twice a day as needed Dx Code E11.9 11/07/15  Yes [name], MD  metFORMIN (GLUCOPHAGE-XR) 500 MG 24 hr tablet TAKE 1 TABLET (500 MG TOTAL) BY MOUTH 2 (TWO) TIMES DAILY. 07/30/16  Yes [name], MD  pregabalin (LYRICA) 25 MG capsule Take 25 mg by mouth 3 (three) times daily. Take 2 tablets in the morning and 1  tablet in the evening.   Yes [provider]  traMADol (ULTRAM) 50 MG tablet Take 1 tablet (50 mg total) by mouth every 6 (six) hours as needed for moderate pain. 07/10/16  Yes Tat, Shanon Brow, MD  triamterene-hydrochlorothiazide (MAXZIDE) 75-50 MG tablet TAKE 1 TABLET BY MOUTH DAILY. 07/08/16  Yes [name], MD    Allergies:  Allergies  Allergen Reactions  . Penicillins Hives and Itching  . Verapamil     Junctional rhythm  . Influenza Vaccines Rash    Social History   Social History  . Marital status: Divorced    Spouse name: N/A  . Number of children: N/A  . Years of education: N/A   Occupational History  . Not on file.   Social History Main Topics  . Smoking status: Never Smoker  . Smokeless tobacco: Never Used  . Alcohol use 0.6 oz/week    1 Glasses of wine per week  . Drug use: No  . 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.   Other Topics Concern  . Not on file   Social History Narrative  . No narrative on file     Family History  Problem Relation Age of Onset  . Cancer Mother        colon, breast, pancreas, skin cancer  . Heart disease Father        heart valve replaced  . Stroke Father   . Diabetes Father   . Cancer Paternal Grandmother        colon  . Obesity Son   . Heart disease Son        bradycardia     ROS:  Please see the history of present illness.     All other systems reviewed and negative.    Physical Exam: Blood pressure 126/62, pulse (!) 48, height 6' (1.829 m), weight 229 lb 6.4 oz (104.1 kg). General: Well developed, Morbidly obese  male in no acute distress. Head: Normocephalic, atraumatic, sclera non-icteric, no xanthomas, nares are without discharge. EENT: normal  Lymph Nodes:  none Neck: Negative for carotid bruits. JVD not elevated. Back:without scoliosis kyphosis Lungs: Clear bilaterally to auscultation without  wheezes, rales, or rhonchi. Breathing is unlabored. Heart: Irregularly irregular rate and rhythm with a normal S1 and S2 and a 2/6 systolic murmur . No rubs, or gallops appreciated. Abdomen: Soft, non-tender, non-distended with normoactive bowel sounds. No hepatomegaly. No rebound/guarding. No obvious abdominal masses. Msk:  Strength and tone appear normal for age. Extremities: No clubbing or cyanosis. 2-3+ edema.  Distal pedal pulses are 2+ and equal bilaterally. Skin: Warm and Dry Neuro: Alert and oriented X 3. CN III-XII intact Grossly normal sensory and motor function .  Psych:  Responds to questions appropriately with a normal affect.      Labs: Cardiac Enzymes No results for input(s): CKTOTAL, CKMB, TROPONINI in the last 72 hours. CBC Lab Results  Component Value Date   WBC 7.5 08/15/2016   HGB 13.8 08/15/2016   HCT 40.5 08/15/2016   MCV 81 08/15/2016   PLT 212 08/15/2016   PROTIME: No results for input(s): LABPROT, INR in the last 72 hours. Chemistry No results for input(s): NA, K, CL, CO2, BUN, CREATININE, CALCIUM, PROT, BILITOT, ALKPHOS, ALT, AST, GLUCOSE in the last 168 hours.  Invalid input(s): LABALBU Lipids Lab Results  Component Value Date   CHOL 93 07/22/2016   HDL 33.30 (L) 07/22/2016   LDLCALC 38 07/22/2016   TRIG 112.0 07/22/2016   BNP No results found for: PROBNP Thyroid Function Tests: No results for input(s): TSH, T4TOTAL, T3FREE, THYROIDAB in the last 72 hours.  Invalid input(s): FREET3 Miscellaneous No results found for: DDIMER  Radiology/Studies:  No results found.  EKG: Atrial fibrillation at 48 Intervals-/14/45 Nonspecific IVCD  ECG post cardioversion June 5 sinus rhythm at 64 Intervals 28/exterior/47 with a nonspecific IVCD  ECG 28 June atrial fibrillation rate of 40  Assessment and Plan:   Atrial fibrillation-persistent slow ventricular response  Dyspnea on exertion   First-degree AV block  Congestive heart failure diastolic  acute/chronic   The patient has had persistent atrial fibrillation interestingly, in the context of his conduction system disease, ventricular rates are slower with atrial fibrillation and we are in sinus rhythm. Improvement in functional status with sinus rhythm and therefore could be related to improve rate or regaining of AV synchrony. He and his son are both first to more medications, hence, we had decided upon a strategy of addressing the symptomatic bradycardia first with pacing and then his symptoms do not abate sufficiently proceeding at that point with antiarrhythmic drug therapy to restore and hopefully maintaining sinus rhythm to regain the AV synchrony.  He has evidence of congestive failure. We will increase his Lasix to take it daily as opposed as needed.  The benefits and risks were reviewed including but not limited to death,  perforation, infection, lead dislodgement and device malfunction.  The patient understands agrees and is willing to proceed.     Vincent Allen

## 2016-09-20 NOTE — Discharge Summary (Signed)
ELECTROPHYSIOLOGY PROCEDURE DISCHARGE SUMMARY    Patient ID: Vincent Allen,  MRN: [id number], DOB/AGE: 1933/08/27 83 y.o.  Admit date: 09/20/2016 Discharge date: 09/21/2016  Primary Care Physician: [name], MD Primary Cardiologist: [place] Electrophysiologist: Caryl Comes  Primary Discharge Diagnosis:   Symptomatic bradycardia status post pacemaker implantation this admission  Secondary Discharge Diagnosis:  1.  Persistent atrial fibrillation 2.  HTN 3.  Diabetes  Allergies  Allergen Reactions  . Penicillins Hives and Itching    Has patient had a PCN reaction causing immediate rash, facial/tongue/throat swelling, SOB or lightheadedness with hypotension:Yes Has patient had a PCN reaction causing severe rash involving mucus membranes or skin necrosis:No Has patient had a PCN reaction that required hospitalization:No Has patient had a PCN reaction occurring within the last 10 years:No If all of the above answers are "NO", then may proceed with Cephalosporin use.   . Verapamil     Junctional rhythm  . Influenza Vaccines Rash     Procedures This Admission:  1.  Implantation of a MDT dual chamber PPM on 09/20/16 by Dr Caryl Comes.  See op note for full details. There were no immediate post procedure complications. 2.  CXR on 09/21/16 demonstrated no pneumothorax status post device implantation.   Brief HPI: Vincent Allen is a 83 y.o. male was referred to electrophysiology in the outpatient setting for consideration of PPM implantation.  Past medical history includes persistent atrial fibrillation and symptomatic bradycardia.  The patient has had symptomatic bradycardia without reversible causes identified.  Risks, benefits, and alternatives to PPM implantation were reviewed with the patient who wished to proceed.   Hospital Course:  The patient was admitted and underwent implantation of a MDT dual chamber PPM with details as outlined above. He was monitored on telemetry  overnight which demonstrated afib with V pacing.  Left chest was without hematoma or ecchymosis.  The device was interrogated and found to be functioning normally.  CXR was obtained and demonstrated no pneumothorax status post device implantation.  Wound care, arm mobility, and restrictions were reviewed with the patient.  The patient was examined and considered stable for discharge to home.    Physical Exam: Vitals:   09/20/16 1914 09/20/16 2000 09/21/16 0205 09/21/16 0741  BP: (!) 151/47 (!) 140/54 (!) 156/62 (!) 156/57  Pulse: 63 (!) 59 (!) 58 60  Resp: 15 16 18 19   Temp: (!) 97.5 F (36.4 C)  98.1 F (36.7 C) 97.9 F (36.6 C)  TempSrc: Oral  Oral Oral  SpO2: 98% 96% 98% 96%  Weight:   230 lb 13.2 oz (104.7 kg)   Height:        GEN- The patient is well appearing, alert and oriented x 3 today.   HEENT: normocephalic, atraumatic; sclera clear, conjunctiva pink; hearing intact; oropharynx clear; neck supple, no JVP Lymph- no cervical lymphadenopathy Lungs- Clear to ausculation bilaterally, normal work of breathing.  No wheezes, rales, rhonchi. Surgical incision healing well. No hematoma.  Heart- Irregularly irregular, no murmurs, rubs or gallops, PMI not laterally displaced GI- soft, non-tender, non-distended, bowel sounds present, no hepatosplenomegaly Extremities- no clubbing, cyanosis, or edema; DP/PT/radial pulses 2+ bilaterally MS- no significant deformity or atrophy Skin- warm and dry, no rash or lesion, left chest without hematoma/ecchymosis Psych- euthymic mood, full affect Neuro- strength and sensation are intact   Labs:   Lab Results  Component Value Date   WBC 12.9 (H) 09/13/2016   HGB 13.2 09/13/2016   HCT 41.8 09/13/2016  MCV 86 09/13/2016   PLT 210 09/13/2016    Discharge Medications:  Allergies as of 09/21/2016      Reactions   Penicillins Hives, Itching   Has patient had a PCN reaction causing immediate rash, facial/tongue/throat swelling, SOB or  lightheadedness with hypotension:Yes Has patient had a PCN reaction causing severe rash involving mucus membranes or skin necrosis:No Has patient had a PCN reaction that required hospitalization:No Has patient had a PCN reaction occurring within the last 10 years:No If all of the above answers are "NO", then may proceed with Cephalosporin use.   Verapamil    Junctional rhythm   Influenza Vaccines Rash      Medication List    TAKE these medications   amLODipine 5 MG tablet Commonly known as:  NORVASC TAKE 1 TABLET BY MOUTH EVERY DAY   apixaban 2.5 MG Tabs tablet Commonly known as:  ELIQUIS Take 1 tablet (2.5 mg total) by mouth 2 (two) times daily.   atorvastatin 20 MG tablet Commonly known as:  LIPITOR TAKE 1 TABLET BY MOUTH DAILY   calcium carbonate 600 MG tablet Commonly known as:  OS-CAL Take 1 tablet (600 mg total) by mouth 2 (two) times daily.   furosemide 20 MG tablet Commonly known as:  LASIX TAKE 1 TABLET (20 MG TOTAL) BY MOUTH DAILY AS NEEDED FOR EDEMA (WEIGHT GAIN >3LBS IN 24 HRS, SOB). What changed:  when to take this   glipiZIDE 10 MG tablet Commonly known as:  GLUCOTROL TAKE 2 TABLETS BY MOUTH TWICE DAILY BEFORE MEALS   glucose blood test strip Commonly known as:  ONE TOUCH ULTRA TEST Use as twice daily to check blood sugar.  DX E11.9   JANUVIA 100 MG tablet Generic drug:  sitaGLIPtin TAKE 1/2 TO 1 TABLET DAILY What changed:  See the new instructions.   LUBRICANT EYE DROPS 0.4-0.3 % Soln Generic drug:  Polyethyl Glycol-Propyl Glycol Place 1 drop into both eyes 3 (three) times daily as needed (for dry eyes.).   metFORMIN 500 MG 24 hr tablet Commonly known as:  GLUCOPHAGE-XR TAKE 1 TABLET (500 MG TOTAL) BY MOUTH 2 (TWO) TIMES DAILY.   onetouch ultrasoft lancets Use as instructed to check blood sugar twice a day as needed Dx Code E11.9   traMADol 50 MG tablet Commonly known as:  ULTRAM Take 1 tablet (50 mg total) by mouth every 6 (six) hours as  needed for moderate pain.   triamterene-hydrochlorothiazide 75-50 MG tablet Commonly known as:  MAXZIDE TAKE 1 TABLET BY MOUTH DAILY.       Disposition:  Discharge Instructions    Call MD for:  persistant dizziness or light-headedness    Complete by:  As directed    Call MD for:  redness, tenderness, or signs of infection (pain, swelling, redness, odor or green/yellow discharge around incision site)    Complete by:  As directed    Call MD for:  temperature >100.4    Complete by:  As directed    Diet - low sodium heart healthy    Complete by:  As directed    Increase activity slowly    Complete by:  As directed      Follow-up Information    [facility] Follow up on 10/03/2016.   Specialty:  Cardiology Why:  at 10:30AM Contact information: [address], [address]       Deboraha Sprang, MD Follow up on 12/17/2016.   Specialty:  Cardiology Why:  at 1:45PM Contact information: [address] [address] [phone]           Duration of Discharge Encounter: Greater than 30 minutes including physician time.  Jarrett Soho, [facility] 09/21/2016 7:51 AM  I have seen and examined this patient with Bhagat,Bhavinkumar.  Agree with above, note added to reflect my findings.  On exam, RRR, no murmurs, lungs clear. Pacemaker implanted for atrial fibrillation with slow response and sick sinus syndrome. CXR and interrogation without issue. Plan for discharge today with follow up in device clinic in 10 days.    Angellica Maddison M. Ronella Plunk MD 09/21/2016 8:10 AM

## 2016-09-21 ENCOUNTER — Ambulatory Visit (HOSPITAL_COMMUNITY): Payer: PPO

## 2016-09-21 DIAGNOSIS — K219 Gastro-esophageal reflux disease without esophagitis: Secondary | ICD-10-CM | POA: Diagnosis not present

## 2016-09-21 DIAGNOSIS — Z7901 Long term (current) use of anticoagulants: Secondary | ICD-10-CM | POA: Diagnosis not present

## 2016-09-21 DIAGNOSIS — N183 Chronic kidney disease, stage 3 (moderate): Secondary | ICD-10-CM | POA: Diagnosis not present

## 2016-09-21 DIAGNOSIS — I495 Sick sinus syndrome: Secondary | ICD-10-CM

## 2016-09-21 DIAGNOSIS — Z887 Allergy status to serum and vaccine status: Secondary | ICD-10-CM | POA: Diagnosis not present

## 2016-09-21 DIAGNOSIS — E1122 Type 2 diabetes mellitus with diabetic chronic kidney disease: Secondary | ICD-10-CM | POA: Diagnosis not present

## 2016-09-21 DIAGNOSIS — I481 Persistent atrial fibrillation: Secondary | ICD-10-CM | POA: Diagnosis not present

## 2016-09-21 DIAGNOSIS — I5033 Acute on chronic diastolic (congestive) heart failure: Secondary | ICD-10-CM | POA: Diagnosis not present

## 2016-09-21 DIAGNOSIS — I130 Hypertensive heart and chronic kidney disease with heart failure and stage 1 through stage 4 chronic kidney disease, or unspecified chronic kidney disease: Secondary | ICD-10-CM | POA: Diagnosis not present

## 2016-09-21 DIAGNOSIS — Z79899 Other long term (current) drug therapy: Secondary | ICD-10-CM | POA: Diagnosis not present

## 2016-09-21 DIAGNOSIS — Z880 Allergy status to penicillin: Secondary | ICD-10-CM | POA: Diagnosis not present

## 2016-09-21 DIAGNOSIS — Z959 Presence of cardiac and vascular implant and graft, unspecified: Secondary | ICD-10-CM

## 2016-09-21 DIAGNOSIS — Z950 Presence of cardiac pacemaker: Secondary | ICD-10-CM | POA: Diagnosis not present

## 2016-09-21 DIAGNOSIS — E785 Hyperlipidemia, unspecified: Secondary | ICD-10-CM | POA: Diagnosis not present

## 2016-09-21 LAB — GLUCOSE, CAPILLARY: GLUCOSE-CAPILLARY: 324 mg/dL — AB (ref 65–99)

## 2016-09-23 ENCOUNTER — Encounter (HOSPITAL_COMMUNITY): Payer: Self-pay | Admitting: Internal Medicine

## 2016-09-23 MED FILL — Gentamicin Sulfate Inj 40 MG/ML: INTRAMUSCULAR | Qty: 2 | Status: AC

## 2016-09-23 MED FILL — Sodium Chloride Irrigation Soln 0.9%: Qty: 500 | Status: AC

## 2016-09-25 ENCOUNTER — Encounter (INDEPENDENT_AMBULATORY_CARE_PROVIDER_SITE_OTHER): Payer: Self-pay | Admitting: Orthopedic Surgery

## 2016-09-25 ENCOUNTER — Ambulatory Visit (INDEPENDENT_AMBULATORY_CARE_PROVIDER_SITE_OTHER): Payer: PPO | Admitting: Orthopedic Surgery

## 2016-09-25 VITALS — BP 136/81 | HR 72 | Ht 72.0 in | Wt 220.0 lb

## 2016-09-25 DIAGNOSIS — M1712 Unilateral primary osteoarthritis, left knee: Secondary | ICD-10-CM | POA: Diagnosis not present

## 2016-09-25 MED ORDER — SODIUM HYALURONATE (VISCOSUP) 20 MG/2ML IX SOSY
20.0000 mg | PREFILLED_SYRINGE | INTRA_ARTICULAR | Status: AC | PRN
  Administered 2016-09-25: 20 mg via INTRA_ARTICULAR

## 2016-09-25 MED ORDER — LIDOCAINE HCL 2 % IJ SOLN
2.0000 mL | INTRAMUSCULAR | Status: AC | PRN
  Administered 2016-09-25: 2 mL

## 2016-09-25 NOTE — Progress Notes (Signed)
Office Visit Note   Patient: Vincent Allen           Date of Birth: 1933-06-23           MRN: [id number] Visit Date: 09/25/2016              Requested by: [name], MD [address] [place], [place] [address] PCP: [name], MD   Assessment & Plan: Visit Diagnoses:  1. Unilateral primary osteoarthritis, left knee     Plan:  #1: Second Euflex injection was given without difficulty. #2: Return in 1 week for the final Euflex injection.  Follow-Up Instructions: Return in about 1 week (around 10/02/2016).   Orders:  No orders of the defined types were placed in this encounter.  No orders of the defined types were placed in this encounter.     Procedures: Large Joint Inj Date/Time: 09/25/2016 10:08 AM Performed by: Biagio Borg D Authorized by: Biagio Borg D   Consent Given by:  Patient Timeout: prior to procedure the correct patient, procedure, and site was verified   Indications:  Pain and joint swelling Location:  Knee Site:  L knee Prep: patient was prepped and draped in usual sterile fashion   Needle Size:  25 G Needle Length:  1.5 inches Approach:  Anteromedial Ultrasound Guidance: No   Fluoroscopic Guidance: No   Arthrogram: No   Medications:  20 mg Sodium Hyaluronate 20 MG/2ML; 2 mL lidocaine 2 % Aspiration Attempted: No   Patient tolerance:  Patient tolerated the procedure well with no immediate complications     Clinical Data: No additional findings.   Subjective: Chief Complaint  Patient presents with  . Left Knee - Pain  . Injections    LEFT KNEE #2 Vincent Allen is a 83 year old white male who presents today for his second Euflex injection to the left knee. He states had actually very good results so far. Very happy with his result so far. Denies any reactivity of pain.    Review of Systems   Objective: Vital Signs: BP 136/81   Pulse 72   Ht 6' (1.829 m)   Wt 220 lb (99.8 kg)   BMI 29.84  kg/m   Physical Exam  Ortho Exam  His left knee reveals benign-looking knee with no real effusion. No warmth or erythema.  Specialty Comments:  No specialty comments available.  Imaging: No results found.   PMFS History: Patient Active Problem List   Diagnosis Date Noted  . Cardiac device in situ   . Atrial flutter ([place]) 08/15/2016  . Other fatigue 08/15/2016  . Acute diastolic CHF (congestive heart failure) ([place]) 08/15/2016  . Bradycardia 07/09/2016  . Atrial fibrillation with slow ventricular response ([place]) 07/09/2016  . Vitamin D deficiency 07/17/2015  . Preventative health care 07/17/2015  . Lumbago 12/02/2014  . Acute subdural hematoma ([place]) 05/20/2014  . Fracture closed, nasal bone 05/19/2014  . Subdural hematoma ([place]) 05/19/2014  . Nasal fracture 05/19/2014  . Pain of toe of left foot 03/14/2014  . Tinea pedis of left foot 03/14/2014  . Diabetes mellitus type 2 in obese ([place]) 12/12/2013  . Medicare annual wellness visit, subsequent 01/17/2013  . Essential hypertension 01/13/2013  . Chronic kidney disease stage III (GFR 30-59 ml/min) 05/03/2012  . Insomnia 06/27/2011  . Osteopenia 09/02/2010  . Family history of colon cancer 09/02/2010  . Diabetes mellitus with chronic kidney disease ([place])   . Hypertensive heart disease   .  Hyperlipidemia   . Carotid artery disease ([place])   . GERD (gastroesophageal reflux disease)   . CTS (carpal tunnel syndrome) 08/28/2010   Past Medical History:  Diagnosis Date  . Arthritis   . Bradycardia   . Cataract   . Chronic kidney disease stage III (GFR 30-59 ml/min) 05/03/2012  . Colon polyps   . Complication of anesthesia    emesis  . Diabetes mellitus   . GERD (gastroesophageal reflux disease)   . HOH (hard of hearing)   . HTN (hypertension) 01/13/2013  . Hyperlipidemia   . Hypertension   . Persistent atrial fibrillation ([place])   . Presence of permanent cardiac pacemaker 09/20/2016  . Symptomatic bradycardia   . Vitamin  D deficiency 07/17/2015    Family History  Problem Relation Age of Onset  . Cancer Mother        colon, breast, pancreas, skin cancer  . Heart disease Father        heart valve replaced  . Stroke Father   . Diabetes Father   . Cancer Paternal Grandmother        colon  . Obesity Son   . Heart disease Son        bradycardia    Past Surgical History:  Procedure Laterality Date  . ANKLE ARTHROPLASTY    . CARDIOVERSION N/A 08/20/2016   Procedure: CARDIOVERSION;  Surgeon: Sanda Klein, MD;  Location: [facility];  Service: Cardiovascular;  Laterality: N/A;  . CATARACT EXTRACTION  2010, 2014  . CATARACT EXTRACTION  02/28/14  . CHOLECYSTECTOMY    . CHOLECYSTECTOMY N/A 05/03/2012   Procedure: LAPAROSCOPIC CHOLECYSTECTOMY WITH INTRAOPERATIVE CHOLANGIOGRAM;  Surgeon: [name], MD;  Location: [facility];  Service: General;  Laterality: N/A;  . INSERT / REPLACE / REMOVE PACEMAKER  09/20/2016  . PACEMAKER IMPLANT N/A 09/20/2016   Procedure: Pacemaker Implant;  Surgeon: Deboraha Sprang, MD;  Location: [facility];  Service: Cardiovascular;  Laterality: N/A;  . ROTATOR CUFF REPAIR    . SHOULDER SURGERY    . TONSILLECTOMY     Social History   Occupational History  . Not on file.   Social History Main Topics  . Smoking status: Never Smoker  . Smokeless tobacco: Never Used  . Alcohol use 0.6 oz/week    1 Glasses of wine per week  . Drug use: No  . 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.

## 2016-09-30 ENCOUNTER — Other Ambulatory Visit: Payer: Self-pay | Admitting: Family Medicine

## 2016-10-02 ENCOUNTER — Encounter (INDEPENDENT_AMBULATORY_CARE_PROVIDER_SITE_OTHER): Payer: Self-pay | Admitting: Orthopedic Surgery

## 2016-10-02 ENCOUNTER — Ambulatory Visit (INDEPENDENT_AMBULATORY_CARE_PROVIDER_SITE_OTHER): Payer: PPO | Admitting: Orthopedic Surgery

## 2016-10-02 DIAGNOSIS — M1712 Unilateral primary osteoarthritis, left knee: Secondary | ICD-10-CM | POA: Diagnosis not present

## 2016-10-02 MED ORDER — LIDOCAINE HCL 2 % IJ SOLN
2.0000 mL | INTRAMUSCULAR | Status: AC | PRN
  Administered 2016-10-02: 2 mL

## 2016-10-02 MED ORDER — SODIUM HYALURONATE (VISCOSUP) 20 MG/2ML IX SOSY
20.0000 mg | PREFILLED_SYRINGE | INTRA_ARTICULAR | Status: AC | PRN
  Administered 2016-10-02: 20 mg via INTRA_ARTICULAR

## 2016-10-02 NOTE — Progress Notes (Signed)
Office Visit Note   Patient: Vincent Allen           Date of Birth: 10-Jul-1933           MRN: [id number] Visit Date: 10/02/2016              Requested by: [name], MD [address] [place], [place] [address] PCP: [name], MD   Assessment & Plan: Visit Diagnoses:  1. Unilateral primary osteoarthritis, left knee     Plan:  #1: Third Euflexa injection was given without difficulty #2: Follow back up when necessary  Follow-Up Instructions: Return if symptoms worsen or fail to improve.   Orders:  No orders of the defined types were placed in this encounter.  No orders of the defined types were placed in this encounter.     Procedures: Large Joint Inj Date/Time: 10/02/2016 10:32 AM Performed by: Biagio Borg D Authorized by: Biagio Borg D   Consent Given by:  Patient Timeout: prior to procedure the correct patient, procedure, and site was verified   Indications:  Pain and joint swelling Location:  Knee Site:  L knee Prep: patient was prepped and draped in usual sterile fashion   Needle Size:  25 G Needle Length:  1.5 inches Approach:  Anteromedial Ultrasound Guidance: No   Fluoroscopic Guidance: No   Arthrogram: No   Medications:  20 mg Sodium Hyaluronate 20 MG/2ML; 2 mL lidocaine 2 % Aspiration Attempted: No   Patient tolerance:  Patient tolerated the procedure well with no immediate complications     Clinical Data: No additional findings.   Subjective: Chief Complaint  Patient presents with  . Left Knee - Pain  . Knee Pain    Left knee #3 buy and bill, some improvement, tender    Vincent Allen is very pleasant 83 year old white male who is seen today for his third Euflexa injections the left knee. He has had some improvement. Seen today for injection. Denies any reactivity.    Review of Systems   Objective: Vital Signs: There were no vitals taken for this visit.  Physical Exam  Ortho Exam  Left knee exam reveals  no warmth or erythema. No reactivity.  Specialty Comments:  No specialty comments available.  Imaging: No results found.   PMFS History: Patient Active Problem List   Diagnosis Date Noted  . Cardiac device in situ   . Atrial flutter ([place]) 08/15/2016  . Other fatigue 08/15/2016  . Acute diastolic CHF (congestive heart failure) ([place]) 08/15/2016  . Bradycardia 07/09/2016  . Atrial fibrillation with slow ventricular response ([place]) 07/09/2016  . Vitamin D deficiency 07/17/2015  . Preventative health care 07/17/2015  . Lumbago 12/02/2014  . Acute subdural hematoma ([place]) 05/20/2014  . Fracture closed, nasal bone 05/19/2014  . Subdural hematoma ([place]) 05/19/2014  . Nasal fracture 05/19/2014  . Pain of toe of left foot 03/14/2014  . Tinea pedis of left foot 03/14/2014  . Diabetes mellitus type 2 in obese ([place]) 12/12/2013  . Medicare annual wellness visit, subsequent 01/17/2013  . Essential hypertension 01/13/2013  . Chronic kidney disease stage III (GFR 30-59 ml/min) 05/03/2012  . Insomnia 06/27/2011  . Osteopenia 09/02/2010  . Family history of colon cancer 09/02/2010  . Diabetes mellitus with chronic kidney disease ([place])   . Hypertensive heart disease   . Hyperlipidemia   . Carotid artery disease ([place])   . GERD (gastroesophageal reflux disease)   . CTS (carpal tunnel syndrome) 08/28/2010  Past Medical History:  Diagnosis Date  . Arthritis   . Bradycardia   . Cataract   . Chronic kidney disease stage III (GFR 30-59 ml/min) 05/03/2012  . Colon polyps   . Complication of anesthesia    emesis  . Diabetes mellitus   . GERD (gastroesophageal reflux disease)   . HOH (hard of hearing)   . HTN (hypertension) 01/13/2013  . Hyperlipidemia   . Hypertension   . Persistent atrial fibrillation ([place])   . Presence of permanent cardiac pacemaker 09/20/2016  . Symptomatic bradycardia   . Vitamin D deficiency 07/17/2015    Family History  Problem Relation Age of Onset  . Cancer  Mother        colon, breast, pancreas, skin cancer  . Heart disease Father        heart valve replaced  . Stroke Father   . Diabetes Father   . Cancer Paternal Grandmother        colon  . Obesity Son   . Heart disease Son        bradycardia    Past Surgical History:  Procedure Laterality Date  . ANKLE ARTHROPLASTY    . CARDIOVERSION N/A 08/20/2016   Procedure: CARDIOVERSION;  Surgeon: Sanda Klein, MD;  Location: [facility];  Service: Cardiovascular;  Laterality: N/A;  . CATARACT EXTRACTION  2010, 2014  . CATARACT EXTRACTION  02/28/14  . CHOLECYSTECTOMY    . CHOLECYSTECTOMY N/A 05/03/2012   Procedure: LAPAROSCOPIC CHOLECYSTECTOMY WITH INTRAOPERATIVE CHOLANGIOGRAM;  Surgeon: [name], MD;  Location: [facility];  Service: General;  Laterality: N/A;  . INSERT / REPLACE / REMOVE PACEMAKER  09/20/2016  . PACEMAKER IMPLANT N/A 09/20/2016   Procedure: Pacemaker Implant;  Surgeon: Deboraha Sprang, MD;  Location: [facility];  Service: Cardiovascular;  Laterality: N/A;  . ROTATOR CUFF REPAIR    . SHOULDER SURGERY    . TONSILLECTOMY     Social History   Occupational History  . Not on file.   Social History Main Topics  . Smoking status: Never Smoker  . Smokeless tobacco: Never Used  . Alcohol use 0.6 oz/week    1 Glasses of wine per week  . Drug use: No  . 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.

## 2016-10-03 ENCOUNTER — Ambulatory Visit (INDEPENDENT_AMBULATORY_CARE_PROVIDER_SITE_OTHER): Payer: PPO | Admitting: *Deleted

## 2016-10-03 DIAGNOSIS — R001 Bradycardia, unspecified: Secondary | ICD-10-CM | POA: Diagnosis not present

## 2016-10-03 DIAGNOSIS — I4891 Unspecified atrial fibrillation: Secondary | ICD-10-CM | POA: Diagnosis not present

## 2016-10-03 LAB — CUP PACEART INCLINIC DEVICE CHECK
Brady Statistic AS VP Percent: 0 %
Brady Statistic RA Percent Paced: 0.12 %
Brady Statistic RV Percent Paced: 94.79 %
Date Time Interrogation Session: 20180719111226
Implantable Lead Implant Date: 20180706
Implantable Lead Implant Date: 20180706
Implantable Lead Location: 753859
Implantable Lead Model: 5076
Lead Channel Impedance Value: 722 Ohm
Lead Channel Sensing Intrinsic Amplitude: 1.5 mV
Lead Channel Sensing Intrinsic Amplitude: 11.5 mV
Lead Channel Sensing Intrinsic Amplitude: 12.875 mV
Lead Channel Setting Pacing Amplitude: 3.5 V
Lead Channel Setting Pacing Amplitude: 3.5 V
Lead Channel Setting Sensing Sensitivity: 2.8 mV
MDC IDC LEAD LOCATION: 753860
MDC IDC MSMT BATTERY REMAINING LONGEVITY: 155 mo
MDC IDC MSMT BATTERY VOLTAGE: 3.21 V
MDC IDC MSMT LEADCHNL RA IMPEDANCE VALUE: 342 Ohm
MDC IDC MSMT LEADCHNL RA IMPEDANCE VALUE: 608 Ohm
MDC IDC MSMT LEADCHNL RA SENSING INTR AMPL: 1.375 mV
MDC IDC MSMT LEADCHNL RV IMPEDANCE VALUE: 608 Ohm
MDC IDC MSMT LEADCHNL RV PACING THRESHOLD AMPLITUDE: 0.75 V
MDC IDC MSMT LEADCHNL RV PACING THRESHOLD PULSEWIDTH: 0.4 ms
MDC IDC PG IMPLANT DT: 20180706
MDC IDC SET LEADCHNL RV PACING PULSEWIDTH: 0.4 ms
MDC IDC STAT BRADY AP VP PERCENT: 0 %
MDC IDC STAT BRADY AP VS PERCENT: 0 %
MDC IDC STAT BRADY AS VS PERCENT: 0 %

## 2016-10-03 NOTE — Progress Notes (Signed)
Wound check appointment. Dermabond removed. Wound without redness or edema. Incision edges approximated, wound well healed. Normal device function. Thresholds, sensing, and impedances consistent with implant measurements. Device programmed at 3.5Von for extra safety margin until 3 month visit. Histogram distribution appropriate for patient and level of activity. 100% AT/AF + Eliquis. No high ventricular rates noted. Patient educated about wound care, arm mobility, lifting restrictions. ROV 12/17/2016 w/ SK

## 2016-10-05 ENCOUNTER — Encounter (HOSPITAL_COMMUNITY): Payer: Self-pay | Admitting: Emergency Medicine

## 2016-10-05 ENCOUNTER — Ambulatory Visit (HOSPITAL_COMMUNITY)
Admission: EM | Admit: 2016-10-05 | Discharge: 2016-10-05 | Disposition: A | Discharge status: Home / Self Care | Payer: PPO | Attending: Radiology | Admitting: Radiology

## 2016-10-05 DIAGNOSIS — N10 Acute tubulo-interstitial nephritis: Secondary | ICD-10-CM

## 2016-10-05 LAB — POCT URINALYSIS DIP (DEVICE)
BILIRUBIN URINE: NEGATIVE
GLUCOSE, UA: NEGATIVE mg/dL
KETONES UR: NEGATIVE mg/dL
LEUKOCYTES UA: NEGATIVE
Nitrite: NEGATIVE
PROTEIN: 30 mg/dL — AB
Specific Gravity, Urine: 1.01 (ref 1.005–1.030)
Urobilinogen, UA: 0.2 mg/dL (ref 0.0–1.0)
pH: 6 (ref 5.0–8.0)

## 2016-10-05 MED ORDER — CIPROFLOXACIN HCL 500 MG PO TABS: 500.0000 mg | ORAL_TABLET | Freq: Two times a day (BID) | ORAL | 0 refills | Status: DC

## 2016-10-05 MED ORDER — CLOTRIMAZOLE 1 % EX CREA: TOPICAL_CREAM | CUTANEOUS | 0 refills | Status: DC

## 2016-10-05 NOTE — ED Provider Notes (Signed)
CSN: [id number]     Arrival date & time 10/05/16  1207 History   First MD Initiated Contact with Patient 10/05/16 1254     Chief Complaint  Patient presents with  . Rash   (Consider location/radiation/quality/duration/timing/severity/associated sxs/prior Treatment) 83 y.o. male presents with penial pain and pain with urination  X 2 days. Pain is constant and worsening. Condition is acute in nature. Condition is made better by nothing. Condition is made worse by nothig. Patient denies any treatment prior to there arrival at this facility. Patient has recently received a pacemaker placement and was seen by cardiology last week who per patient signed off at that time. Patient denies any fever or back pain       Past Medical History:  Diagnosis Date  . Arthritis   . Bradycardia   . Cataract   . Chronic kidney disease stage III (GFR 30-59 ml/min) 05/03/2012  . Colon polyps   . Complication of anesthesia    emesis  . Diabetes mellitus   . GERD (gastroesophageal reflux disease)   . HOH (hard of hearing)   . HTN (hypertension) 01/13/2013  . Hyperlipidemia   . Hypertension   . Persistent atrial fibrillation ([place])   . Presence of permanent cardiac pacemaker 09/20/2016  . Symptomatic bradycardia   . Vitamin D deficiency 07/17/2015   Past Surgical History:  Procedure Laterality Date  . ANKLE ARTHROPLASTY    . CARDIOVERSION N/A 08/20/2016   Procedure: CARDIOVERSION;  Surgeon: Sanda Klein, MD;  Location: [facility];  Service: Cardiovascular;  Laterality: N/A;  . CATARACT EXTRACTION  2010, 2014  . CATARACT EXTRACTION  02/28/14  . CHOLECYSTECTOMY    . CHOLECYSTECTOMY N/A 05/03/2012   Procedure: LAPAROSCOPIC CHOLECYSTECTOMY WITH INTRAOPERATIVE CHOLANGIOGRAM;  Surgeon: [name], MD;  Location: [facility];  Service: General;  Laterality: N/A;  . INSERT / REPLACE / REMOVE PACEMAKER  09/20/2016  . PACEMAKER IMPLANT N/A 09/20/2016   Procedure: Pacemaker Implant;  Surgeon: Deboraha Sprang, MD;   Location: [facility];  Service: Cardiovascular;  Laterality: N/A;  . ROTATOR CUFF REPAIR    . SHOULDER SURGERY    . TONSILLECTOMY     Family History  Problem Relation Age of Onset  . Cancer Mother        colon, breast, pancreas, skin cancer  . Heart disease Father        heart valve replaced  . Stroke Father   . Diabetes Father   . Cancer Paternal Grandmother        colon  . Obesity Son   . Heart disease Son        bradycardia   Social History  Substance Use Topics  . Smoking status: Never Smoker  . Smokeless tobacco: Never Used  . Alcohol use 0.6 oz/week    1 Glasses of wine per week    Review of Systems  Constitutional: Negative for chills and fever.  HENT: Negative for ear pain and sore throat.   Eyes: Negative for pain and visual disturbance.  Respiratory: Negative for cough and shortness of breath.   Cardiovascular: Negative for chest pain and palpitations.  Gastrointestinal: Negative for abdominal pain and vomiting.  Genitourinary: Positive for dysuria ( burning ). Negative for hematuria.        Soreness at posterior shaft and tip  of penis  Musculoskeletal: Negative for arthralgias and back pain.  Skin: Negative for color change and rash.  Neurological: Negative for seizures and syncope.  All other systems reviewed  and are negative.   Allergies  Penicillins; Verapamil; and Influenza vaccines  Home Medications   Prior to Admission medications   Medication Sig Start Date End Date Taking? Authorizing Provider  amLODipine (NORVASC) 5 MG tablet TAKE 1 TABLET BY MOUTH EVERY DAY 07/08/16   [name], MD  apixaban (ELIQUIS) 2.5 MG TABS tablet Take 1 tablet (2.5 mg total) by mouth 2 (two) times daily. 08/14/16   [name], MD  atorvastatin (LIPITOR) 20 MG tablet TAKE 1 TABLET BY MOUTH DAILY 06/05/16   [name], MD  Calcium 600 MG tablet Take 1 tablet (600 mg total) by mouth 2 (two) times daily. 01/30/11   Burnice Logan, MD  ciprofloxacin  (CIPRO) 500 MG tablet Take 1 tablet (500 mg total) by mouth every 12 (twelve) hours. 10/05/16   Jacqualine Mau, NP  clotrimazole (LOTRIMIN) 1 % cream Apply to affected area 2 times daily 10/05/16   Jacqualine Mau, NP  furosemide (LASIX) 20 MG tablet TAKE 1 TABLET (20 MG TOTAL) BY MOUTH DAILY AS NEEDED FOR EDEMA (WEIGHT GAIN >3LBS IN 24 HRS, SOB). Patient taking differently: Take 20 mg by mouth daily.  09/10/16   [name], MD  glipiZIDE (GLUCOTROL) 10 MG tablet TAKE 2 TABLETS BY MOUTH TWICE DAILY BEFORE MEALS 09/30/16   [name], MD  glucose blood (ONE TOUCH ULTRA TEST) test strip Use as twice daily to check blood sugar.  DX E11.9 11/07/15   [name], MD  JANUVIA 100 MG tablet TAKE 1/2 TO 1 TABLET DAILY Patient taking differently: TAKE 1 TABLET DAILY 12/22/15   [name], MD  Lancets [facility] ULTRASOFT) lancets Use as instructed to check blood sugar twice a day as needed Dx Code E11.9 11/07/15   [name], MD  metFORMIN (GLUCOPHAGE-XR) 500 MG 24 hr tablet TAKE 1 TABLET (500 MG TOTAL) BY MOUTH 2 (TWO) TIMES DAILY. 07/30/16   [name], MD  Polyethyl Glycol-Propyl Glycol (LUBRICANT EYE DROPS) 0.4-0.3 % SOLN Place 1 drop into both eyes 3 (three) times daily as needed (for dry eyes.).    [provider]  traMADol (ULTRAM) 50 MG tablet Take 1 tablet (50 mg total) by mouth every 6 (six) hours as needed for moderate pain. 07/10/16   Orson Eva, MD  triamterene-hydrochlorothiazide (MAXZIDE) 75-50 MG tablet TAKE 1 TABLET BY MOUTH DAILY. 07/08/16   [name], MD   Meds Ordered and Administered this Visit  Medications - No data to display  BP (!) 162/82 Comment: this is recheck of initial blood pressure reading.  regular sized cuff on right  Pulse 86   Temp 97.8 F (36.6 C) (Oral)   Resp 18   SpO2 99%  No data found.   Physical Exam  Constitutional: He is oriented to person, place, and time. He appears well-developed and well-nourished.   HENT:  Head: Normocephalic.  Neck: Normal range of motion.  Pulmonary/Chest: Effort normal.  Genitourinary:  Genitourinary Comments: uncircumcised penis with what appears to be white patches along the shaft.   Musculoskeletal: Normal range of motion.  Neurological: He is alert and oriented to person, place, and time.  Skin: Skin is dry.  Psychiatric: He has a normal mood and affect.  Nursing note and vitals reviewed.   Urgent Care Course     Procedures (including critical care time)  Labs Review Labs Reviewed  POCT URINALYSIS DIP (DEVICE) - Abnormal; Notable for the following:  Result Value   Hgb urine dipstick TRACE (*)    Protein, ur 30 (*)    All other components within normal limits    Imaging Review No results found.        MDM   1. Acute pyelonephritis       Jacqualine Mau, NP 10/05/16 1316

## 2016-10-05 NOTE — ED Triage Notes (Signed)
Burning , itchy area in groin area, noticed yesterday

## 2016-10-05 NOTE — ED Notes (Signed)
Notified jennifer, np of repeat blood pressure.

## 2016-10-14 ENCOUNTER — Encounter: Payer: Self-pay | Admitting: *Deleted

## 2016-10-18 ENCOUNTER — Other Ambulatory Visit (INDEPENDENT_AMBULATORY_CARE_PROVIDER_SITE_OTHER): Payer: PPO

## 2016-10-18 DIAGNOSIS — E785 Hyperlipidemia, unspecified: Secondary | ICD-10-CM

## 2016-10-18 DIAGNOSIS — E119 Type 2 diabetes mellitus without complications: Secondary | ICD-10-CM

## 2016-10-18 DIAGNOSIS — I10 Essential (primary) hypertension: Secondary | ICD-10-CM | POA: Diagnosis not present

## 2016-10-18 LAB — CBC
HCT: 47.1 % (ref 39.0–52.0)
Hemoglobin: 15 g/dL (ref 13.0–17.0)
MCHC: 31.8 g/dL (ref 30.0–36.0)
MCV: 85.2 fl (ref 78.0–100.0)
Platelets: 197 10*3/uL (ref 150.0–400.0)
RBC: 5.53 Mil/uL (ref 4.22–5.81)
RDW: 14.1 % (ref 11.5–15.5)
WBC: 8.8 10*3/uL (ref 4.0–10.5)

## 2016-10-18 LAB — COMPREHENSIVE METABOLIC PANEL
ALK PHOS: 52 U/L (ref 39–117)
ALT: 16 U/L (ref 0–53)
AST: 16 U/L (ref 0–37)
Albumin: 3.7 g/dL (ref 3.5–5.2)
BILIRUBIN TOTAL: 0.7 mg/dL (ref 0.2–1.2)
BUN: 23 mg/dL (ref 6–23)
CO2: 26 meq/L (ref 19–32)
Calcium: 9.4 mg/dL (ref 8.4–10.5)
Chloride: 103 mEq/L (ref 96–112)
Creatinine, Ser: 1.45 mg/dL (ref 0.40–1.50)
GFR: 49.62 mL/min — ABNORMAL LOW (ref >60.00)
GLUCOSE: 87 mg/dL (ref 70–99)
Potassium: 3.4 mEq/L — ABNORMAL LOW (ref 3.5–5.1)
SODIUM: 139 meq/L (ref 135–145)
TOTAL PROTEIN: 7 g/dL (ref 6.0–8.3)

## 2016-10-18 LAB — TSH: TSH: 3.28 u[IU]/mL (ref 0.35–4.50)

## 2016-10-18 LAB — LIPID PANEL
CHOL/HDL RATIO: 3
Cholesterol: 110 mg/dL (ref 0–200)
HDL: 37.3 mg/dL — ABNORMAL LOW (ref >39.00)
LDL Cholesterol: 39 mg/dL (ref 0–99)
NONHDL: 72.31
Triglycerides: 167 mg/dL — ABNORMAL HIGH (ref 0.0–149.0)
VLDL: 33.4 mg/dL (ref 0.0–40.0)

## 2016-10-18 LAB — HEMOGLOBIN A1C: Hgb A1c MFr Bld: 7.2 % — ABNORMAL HIGH (ref 4.6–6.5)

## 2016-10-24 ENCOUNTER — Encounter: Payer: Self-pay | Admitting: Internal Medicine

## 2016-10-24 ENCOUNTER — Ambulatory Visit (INDEPENDENT_AMBULATORY_CARE_PROVIDER_SITE_OTHER): Payer: PPO | Admitting: Internal Medicine

## 2016-10-24 VITALS — BP 152/78 | HR 88 | Ht 72.0 in | Wt 217.2 lb

## 2016-10-24 DIAGNOSIS — I10 Essential (primary) hypertension: Secondary | ICD-10-CM | POA: Diagnosis not present

## 2016-10-24 DIAGNOSIS — N183 Chronic kidney disease, stage 3 unspecified: Secondary | ICD-10-CM

## 2016-10-24 DIAGNOSIS — I4819 Other persistent atrial fibrillation: Secondary | ICD-10-CM

## 2016-10-24 DIAGNOSIS — Z950 Presence of cardiac pacemaker: Secondary | ICD-10-CM | POA: Diagnosis not present

## 2016-10-24 DIAGNOSIS — I481 Persistent atrial fibrillation: Secondary | ICD-10-CM | POA: Diagnosis not present

## 2016-10-24 DIAGNOSIS — E0822 Diabetes mellitus due to underlying condition with diabetic chronic kidney disease: Secondary | ICD-10-CM | POA: Diagnosis not present

## 2016-10-24 DIAGNOSIS — I5032 Chronic diastolic (congestive) heart failure: Secondary | ICD-10-CM | POA: Diagnosis not present

## 2016-10-24 MED ORDER — LOSARTAN POTASSIUM 50 MG PO TABS: 50.0000 mg | ORAL_TABLET | Freq: Every day | ORAL | 3 refills | Status: DC

## 2016-10-24 NOTE — Progress Notes (Signed)
OFFICE FOLLOW-UP NOTE  Chief Complaint:  Follow-up pacemaker  Primary Care Physician: [name], MD  HPI:  Vincent Allen is a 83 y.o. male with a past medial history significant for atrial fibrillation/flutter with slow ventricular response. Seen recently in the hospital after mechanical fall in the home. He has a history of paroxysmal atrial fibrillation in the past and was seen by Dr. Wynonia Lawman that was not on any anticoagulation. His CHADSVASC score is 4. He was started on Eliquis 2.5 mg twice a day due to age greater than 80 creatinine greater than 1.5. An echocardiogram was performed which showed normal systolic function, diastolic dysfunction moderate left atrial enlargement. I arranged for outpatient monitoring which shows persistent atrial fibrillation/flutter with controlled ventricular response and at times bradycardia but no clear long pauses or any other indications for pacemaker at this time. He reports being fatigued. He's been an active problem for more than 60 years and is interested in getting back to [place] feels that his lack of energy is keeping him back from that.  09/12/2016  Vincent Allen returns for follow-up today. He underwent recent a cardioversion by Dr. Sallyanne Kuster. This was successful and he reported feeling a little better with heart rates in sinus in the 50s for about a week, however then started to feel more fatigued. He's not been on any AV nodal blocking medications. He reports compliance with Eliquis. EKG today shows atrial fibrillation with a marked bradycardic response of 40. Notes that his heart rate will not necessarily increase with any exertion.  10/24/2016  Vincent Allen returns today for follow-up. He underwent successful pacemaker placement by Dr. Caryl Comes about 3 weeks ago. This was a Medtronic dual-chamber pacemaker. Subsequent pacemaker follow-up shows 100% atrial fibrillation. Pacemaker appears to be working properly. The wound appears to be well-healed. He is  restricted his arm movement now and is interested in more activity. He recently had lab work in advance to upcoming visit with Dr. Randel Pigg. Total cholesterol is 110, Travis was 167, HDL 37 and LDL 39. Metabolic profile was unremarkable with potassium however was slightly low at 3.4 and creatinine 1.45. Blood pressure is elevated today. I reviewed home readings indicating his blood pressure ranges from the about the 527P to 824M systolic over 35T diastolic. Heart rate is been mostly in the 70s and 80s. There is no evidence of rapid atrial fibrillation or bradycardia. He reports improvement in his energy level.  PMHx:  Past Medical History:  Diagnosis Date  . Arthritis   . Bradycardia   . Cataract   . Chronic kidney disease stage III (GFR 30-59 ml/min) 05/03/2012  . Colon polyps   . Complication of anesthesia    emesis  . Diabetes mellitus   . GERD (gastroesophageal reflux disease)   . HOH (hard of hearing)   . HTN (hypertension) 01/13/2013  . Hyperlipidemia   . Hypertension   . Persistent atrial fibrillation ([place])   . Presence of permanent cardiac pacemaker 09/20/2016  . Symptomatic bradycardia   . Vitamin D deficiency 07/17/2015    Past Surgical History:  Procedure Laterality Date  . ANKLE ARTHROPLASTY    . CARDIOVERSION N/A 08/20/2016   Procedure: CARDIOVERSION;  Surgeon: Sanda Klein, MD;  Location: [facility];  Service: Cardiovascular;  Laterality: N/A;  . CATARACT EXTRACTION  2010, 2014  . CATARACT EXTRACTION  02/28/14  . CHOLECYSTECTOMY    . CHOLECYSTECTOMY N/A 05/03/2012   Procedure: LAPAROSCOPIC CHOLECYSTECTOMY WITH INTRAOPERATIVE CHOLANGIOGRAM;  Surgeon: [name], MD;  Location: [facility]  OR;  Service: General;  Laterality: N/A;  . INSERT / REPLACE / REMOVE PACEMAKER  09/20/2016  . PACEMAKER IMPLANT N/A 09/20/2016   Procedure: Pacemaker Implant;  Surgeon: Deboraha Sprang, MD;  Location: [facility];  Service: Cardiovascular;  Laterality: N/A;  . ROTATOR CUFF REPAIR    .  SHOULDER SURGERY    . TONSILLECTOMY      FAMHx:  Family History  Problem Relation Age of Onset  . Cancer Mother        colon, breast, pancreas, skin cancer  . Heart disease Father        heart valve replaced  . Stroke Father   . Diabetes Father   . Cancer Paternal Grandmother        colon  . Obesity Son   . Heart disease Son        bradycardia    SOCHx:   reports that he has never smoked. He has never used smokeless tobacco. He reports that he drinks about 0.6 oz of alcohol per week . He reports that he does not use drugs.  ALLERGIES:  Allergies  Allergen Reactions  . Penicillins Hives and Itching    Has patient had a PCN reaction causing immediate rash, facial/tongue/throat swelling, SOB or lightheadedness with hypotension:Yes Has patient had a PCN reaction causing severe rash involving mucus membranes or skin necrosis:No Has patient had a PCN reaction that required hospitalization:No Has patient had a PCN reaction occurring within the last 10 years:No If all of the above answers are "NO", then may proceed with Cephalosporin use.   . Verapamil     Junctional rhythm  . Influenza Vaccines Rash    ROS: Pertinent items noted in HPI and remainder of comprehensive ROS otherwise negative.  HOME MEDS: Current Outpatient Prescriptions on File Prior to Visit  Medication Sig Dispense Refill  . amLODipine (NORVASC) 5 MG tablet TAKE 1 TABLET BY MOUTH EVERY DAY 90 tablet 1  . apixaban (ELIQUIS) 2.5 MG TABS tablet Take 1 tablet (2.5 mg total) by mouth 2 (two) times daily. 60 tablet 2  . atorvastatin (LIPITOR) 20 MG tablet TAKE 1 TABLET BY MOUTH DAILY 90 tablet 3  . Calcium 600 MG tablet Take 1 tablet (600 mg total) by mouth 2 (two) times daily. 60 tablet 0  . ciprofloxacin (CIPRO) 500 MG tablet Take 1 tablet (500 mg total) by mouth every 12 (twelve) hours. 10 tablet 0  . clotrimazole (LOTRIMIN) 1 % cream Apply to affected area 2 times daily 15 g 0  . furosemide (LASIX) 20 MG  tablet TAKE 1 TABLET (20 MG TOTAL) BY MOUTH DAILY AS NEEDED FOR EDEMA (WEIGHT GAIN >3LBS IN 24 HRS, SOB). (Patient taking differently: Take 20 mg by mouth daily. ) 30 tablet 0  . glipiZIDE (GLUCOTROL) 10 MG tablet TAKE 2 TABLETS BY MOUTH TWICE DAILY BEFORE MEALS 360 tablet 1  . glucose blood (ONE TOUCH ULTRA TEST) test strip Use as twice daily to check blood sugar.  DX E11.9 100 each 6  . JANUVIA 100 MG tablet TAKE 1/2 TO 1 TABLET DAILY (Patient taking differently: TAKE 1 TABLET DAILY) 30 tablet 5  . Lancets (ONETOUCH ULTRASOFT) lancets Use as instructed to check blood sugar twice a day as needed Dx Code E11.9 100 each 6  . metFORMIN (GLUCOPHAGE-XR) 500 MG 24 hr tablet TAKE 1 TABLET (500 MG TOTAL) BY MOUTH 2 (TWO) TIMES DAILY. 180 tablet 1  . Polyethyl Glycol-Propyl Glycol (LUBRICANT EYE DROPS) 0.4-0.3 % SOLN Place  1 drop into both eyes 3 (three) times daily as needed (for dry eyes.).    Marland Kitchen triamterene-hydrochlorothiazide (MAXZIDE) 75-50 MG tablet TAKE 1 TABLET BY MOUTH DAILY. 90 tablet 1   No current facility-administered medications on file prior to visit.     LABS/IMAGING: No results found for this or any previous visit (from the past 48 hour(s)). No results found.  LIPID PANEL:    Component Value Date/Time   CHOL 110 10/18/2016 0945   TRIG 167.0 (H) 10/18/2016 0945   HDL 37.30 (L) 10/18/2016 0945   CHOLHDL 3 10/18/2016 0945   VLDL 33.4 10/18/2016 0945   LDLCALC 39 10/18/2016 0945   LDLDIRECT 70.8 03/14/2014 1401     WEIGHTS: Wt Readings from Last 3 Encounters:  10/24/16 217 lb 3.2 oz (98.5 kg)  09/25/16 220 lb (99.8 kg)  09/21/16 230 lb 13.2 oz (104.7 kg)    VITALS: BP (!) 152/78   Pulse 88   Ht 6' (1.829 m)   Wt 217 lb 3.2 oz (98.5 kg)   BMI 29.46 kg/m   EXAM: General appearance: alert and no distress Neck: no carotid bruit, no JVD and thyroid not enlarged, symmetric, no tenderness/mass/nodules Lungs: clear to auscultation bilaterally Heart: regular rate and rhythm,  S1, S2 normal, no murmur, click, rub or gallop and Healing pacemaker site left upper chest Abdomen: soft, non-tender; bowel sounds normal; no masses,  no organomegaly Extremities: extremities normal, atraumatic, no cyanosis or edema Pulses: 2+ and symmetric Skin: Skin color, texture, turgor normal. No rashes or lesions Neurologic: Grossly normal Psych: Pleasant  EKG: Deferred  ASSESSMENT: 1. Persistent atrial fibrillation/flutter with slow ventricular response - s/p Medtronic PPM (09/2016) 2. LBBB 3. Diastolic congestive heart failure 4. CKD 3A 5. CHADSVASC score 4-anticoagulated on Eliquis 2.5 mg twice a day  PLAN: 1.   Mr. Borges seems to be feeling much better after pacemaker placement. He remains in persistent atrial fibrillation and had early failure after cardioversion. His pacemaker site is healing nicely. There is no signs of any worsening congestive heart failure. Blood pressure remains above goal. He does have some moderate chronic kidney disease and is a diabetic and therefore would benefit from an ARB or ACE inhibitor. I recommend starting losartan 50 mg daily. He also has mildly low potassium and this may help regulate that as well. He'll have follow-up with his primary care physician next week at which time his blood pressure can be rechecked and medications can be adjusted accordingly. He will also need a repeat metabolic profile.  Follow-up with me in 6 months. He has follow-up with Dr. Caryl Comes in October.  Pixie Casino, MD, [place]  Attending Cardiologist  Direct Dial: [phone]  Fax: [phone]  Website:  [email or web address] 10/24/2016, 8:46 AM

## 2016-10-24 NOTE — Patient Instructions (Addendum)
Your physician has recommended you make the following change in your medication:  -- START losartan 50mg  once daily  Your physician wants you to follow-up in: 6 months with Dr. Debara Pickett. You will receive a reminder letter in the mail two months in advance. If you don't receive a letter, please call our office to schedule the follow-up appointment.  Please send a list of your home BP readings to Dr. Debara Pickett via MyChart to review in about 10-14 days.

## 2016-10-25 ENCOUNTER — Ambulatory Visit: Payer: PPO | Admitting: Family Medicine

## 2016-11-04 ENCOUNTER — Encounter: Payer: Self-pay | Admitting: Family Medicine

## 2016-11-04 ENCOUNTER — Ambulatory Visit (INDEPENDENT_AMBULATORY_CARE_PROVIDER_SITE_OTHER): Payer: PPO | Admitting: Family Medicine

## 2016-11-04 VITALS — BP 126/66 | HR 64 | Temp 97.7°F | Ht 72.0 in | Wt 218.2 lb

## 2016-11-04 DIAGNOSIS — R350 Frequency of micturition: Secondary | ICD-10-CM | POA: Diagnosis not present

## 2016-11-04 DIAGNOSIS — M8580 Other specified disorders of bone density and structure, unspecified site: Secondary | ICD-10-CM

## 2016-11-04 DIAGNOSIS — R001 Bradycardia, unspecified: Secondary | ICD-10-CM | POA: Diagnosis not present

## 2016-11-04 DIAGNOSIS — E1169 Type 2 diabetes mellitus with other specified complication: Secondary | ICD-10-CM

## 2016-11-04 DIAGNOSIS — E669 Obesity, unspecified: Secondary | ICD-10-CM

## 2016-11-04 DIAGNOSIS — I5032 Chronic diastolic (congestive) heart failure: Secondary | ICD-10-CM | POA: Diagnosis not present

## 2016-11-04 DIAGNOSIS — E876 Hypokalemia: Secondary | ICD-10-CM

## 2016-11-04 DIAGNOSIS — I10 Essential (primary) hypertension: Secondary | ICD-10-CM | POA: Diagnosis not present

## 2016-11-04 DIAGNOSIS — E119 Type 2 diabetes mellitus without complications: Secondary | ICD-10-CM

## 2016-11-04 DIAGNOSIS — E785 Hyperlipidemia, unspecified: Secondary | ICD-10-CM | POA: Diagnosis not present

## 2016-11-04 DIAGNOSIS — R5381 Other malaise: Secondary | ICD-10-CM | POA: Diagnosis not present

## 2016-11-04 NOTE — Patient Instructions (Signed)

## 2016-11-05 LAB — URINALYSIS
BILIRUBIN URINE: NEGATIVE
Hgb urine dipstick: NEGATIVE
Ketones, ur: NEGATIVE
LEUKOCYTES UA: NEGATIVE
NITRITE: NEGATIVE
PH: 6 (ref 5.0–8.0)
SPECIFIC GRAVITY, URINE: 1.015 (ref 1.000–1.030)
TOTAL PROTEIN, URINE-UPE24: NEGATIVE
Urine Glucose: NEGATIVE
Urobilinogen, UA: 0.2 (ref 0.0–1.0)

## 2016-11-05 LAB — URINE CULTURE: Organism ID, Bacteria: NO GROWTH

## 2016-11-06 DIAGNOSIS — R350 Frequency of micturition: Secondary | ICD-10-CM | POA: Insufficient documentation

## 2016-11-06 NOTE — Assessment & Plan Note (Signed)
No recent exacerbations.  

## 2016-11-06 NOTE — Progress Notes (Signed)
Patient ID: Vincent Allen, male   DOB: Oct 11, 1933, 83 y.o.   MRN: [id number]    Subjective:    Patient ID: Vincent Allen, male    DOB: 1933-08-01, 83 y.o.   MRN: [id number]  Chief Complaint  Patient presents with  . Follow-up    Patient is here today for a 3 month F/U. He has recently had a pace-maker placed.    HPI Patient is in today for follow up accompanied by his son. Overall he is feeling well. No recent febrile illness or hospitalizations. She is noting some urinary frequency but no hematuria or dysuria. Denies CP/palp/SOB/HA/congestion/fevers/GI or GU c/o. Taking meds as prescribed. He does endorse debility, weakness in legs and unsteady gait.   Past Medical History:  Diagnosis Date  . Arthritis   . Bradycardia   . Cataract   . Chronic kidney disease stage III (GFR 30-59 ml/min) 05/03/2012  . Colon polyps   . Complication of anesthesia    emesis  . Diabetes mellitus   . GERD (gastroesophageal reflux disease)   . HOH (hard of hearing)   . HTN (hypertension) 01/13/2013  . Hyperlipidemia   . Hypertension   . Persistent atrial fibrillation ([place])   . Presence of permanent cardiac pacemaker 09/20/2016  . Symptomatic bradycardia   . Vitamin D deficiency 07/17/2015    Past Surgical History:  Procedure Laterality Date  . ANKLE ARTHROPLASTY    . CARDIOVERSION N/A 08/20/2016   Procedure: CARDIOVERSION;  Surgeon: Sanda Klein, MD;  Location: [facility];  Service: Cardiovascular;  Laterality: N/A;  . CATARACT EXTRACTION  2010, 2014  . CATARACT EXTRACTION  02/28/14  . CHOLECYSTECTOMY    . CHOLECYSTECTOMY N/A 05/03/2012   Procedure: LAPAROSCOPIC CHOLECYSTECTOMY WITH INTRAOPERATIVE CHOLANGIOGRAM;  Surgeon: [name], MD;  Location: [facility];  Service: General;  Laterality: N/A;  . INSERT / REPLACE / REMOVE PACEMAKER  09/20/2016  . PACEMAKER IMPLANT N/A 09/20/2016   Procedure: Pacemaker Implant;  Surgeon: Deboraha Sprang, MD;  Location: [facility];  Service:  Cardiovascular;  Laterality: N/A;  . ROTATOR CUFF REPAIR    . SHOULDER SURGERY    . TONSILLECTOMY      Family History  Problem Relation Age of Onset  . Cancer Mother        colon, breast, pancreas, skin cancer  . Heart disease Father        heart valve replaced  . Stroke Father   . Diabetes Father   . Cancer Paternal Grandmother        colon  . Obesity Son   . Heart disease Son        bradycardia    Social History   Social History  . Marital status: Widowed    Spouse name: N/A  . Number of children: N/A  . Years of education: N/A   Occupational History  . Not on file.   Social History Main Topics  . Smoking status: Never Smoker  . Smokeless tobacco: Never Used  . Alcohol use 0.6 oz/week    1 Glasses of wine per week  . Drug use: No  . 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.   Other Topics Concern  . Not on file   Social History Narrative  . No narrative on file    Outpatient Medications Prior to Visit  Medication Sig Dispense Refill  .  amLODipine (NORVASC) 5 MG tablet TAKE 1 TABLET BY MOUTH EVERY DAY 90 tablet 1  . apixaban (ELIQUIS) 2.5 MG TABS tablet Take 1 tablet (2.5 mg total) by mouth 2 (two) times daily. 60 tablet 2  . atorvastatin (LIPITOR) 20 MG tablet TAKE 1 TABLET BY MOUTH DAILY 90 tablet 3  . Calcium 600 MG tablet Take 1 tablet (600 mg total) by mouth 2 (two) times daily. 60 tablet 0  . clotrimazole (LOTRIMIN) 1 % cream Apply to affected area 2 times daily 15 g 0  . furosemide (LASIX) 20 MG tablet TAKE 1 TABLET (20 MG TOTAL) BY MOUTH DAILY AS NEEDED FOR EDEMA (WEIGHT GAIN >3LBS IN 24 HRS, SOB). (Patient taking differently: Take 20 mg by mouth daily. ) 30 tablet 0  . glipiZIDE (GLUCOTROL) 10 MG tablet TAKE 2 TABLETS BY MOUTH TWICE DAILY BEFORE MEALS 360 tablet 1  . glucose blood (ONE TOUCH ULTRA TEST) test strip Use as twice daily to check blood  sugar.  DX E11.9 100 each 6  . JANUVIA 100 MG tablet TAKE 1/2 TO 1 TABLET DAILY (Patient taking differently: TAKE 1 TABLET DAILY) 30 tablet 5  . Lancets (ONETOUCH ULTRASOFT) lancets Use as instructed to check blood sugar twice a day as needed Dx Code E11.9 100 each 6  . losartan (COZAAR) 50 MG tablet Take 1 tablet (50 mg total) by mouth daily. 90 tablet 3  . metFORMIN (GLUCOPHAGE-XR) 500 MG 24 hr tablet TAKE 1 TABLET (500 MG TOTAL) BY MOUTH 2 (TWO) TIMES DAILY. 180 tablet 1  . triamterene-hydrochlorothiazide (MAXZIDE) 75-50 MG tablet TAKE 1 TABLET BY MOUTH DAILY. 90 tablet 1  . ciprofloxacin (CIPRO) 500 MG tablet Take 1 tablet (500 mg total) by mouth every 12 (twelve) hours. 10 tablet 0  . Polyethyl Glycol-Propyl Glycol (LUBRICANT EYE DROPS) 0.4-0.3 % SOLN Place 1 drop into both eyes 3 (three) times daily as needed (for dry eyes.).     No facility-administered medications prior to visit.     Allergies  Allergen Reactions  . Penicillins Hives and Itching    Has patient had a PCN reaction causing immediate rash, facial/tongue/throat swelling, SOB or lightheadedness with hypotension:Yes Has patient had a PCN reaction causing severe rash involving mucus membranes or skin necrosis:No Has patient had a PCN reaction that required hospitalization:No Has patient had a PCN reaction occurring within the last 10 years:No If all of the above answers are "NO", then may proceed with Cephalosporin use.   . Verapamil     Junctional rhythm  . Influenza Vaccines Rash    Review of Systems  Constitutional: Negative for fever and malaise/fatigue.  HENT: Negative for congestion.   Eyes: Negative for blurred vision.  Respiratory: Negative for shortness of breath.   Cardiovascular: Negative for chest pain, palpitations and leg swelling.  Gastrointestinal: Negative for abdominal pain, blood in stool and nausea.  Genitourinary: Positive for frequency. Negative for dysuria.  Musculoskeletal: Positive for  myalgias. Negative for falls and joint pain.  Skin: Negative for rash.  Neurological: Positive for weakness. Negative for dizziness, loss of consciousness and headaches.  Endo/Heme/Allergies: Negative for environmental allergies.  Psychiatric/Behavioral: Negative for depression. The patient is not nervous/anxious.        Objective:    Physical Exam  BP 126/66 (BP Location: Left Arm, Patient Position: Sitting, Cuff Size: Normal)   Pulse 64   Temp 97.7 F (36.5 C) (Oral)   Ht 6' (1.829 m)   Wt 218 lb 3.2 oz (99 kg)  SpO2 97%   BMI 29.59 kg/m  Wt Readings from Last 3 Encounters:  11/04/16 218 lb 3.2 oz (99 kg)  10/24/16 217 lb 3.2 oz (98.5 kg)  09/25/16 220 lb (99.8 kg)     Lab Results  Component Value Date   WBC 8.8 10/18/2016   HGB 15.0 10/18/2016   HCT 47.1 10/18/2016   PLT 197.0 10/18/2016   GLUCOSE 87 10/18/2016   CHOL 110 10/18/2016   TRIG 167.0 (H) 10/18/2016   HDL 37.30 (L) 10/18/2016   LDLDIRECT 70.8 03/14/2014   LDLCALC 39 10/18/2016   ALT 16 10/18/2016   AST 16 10/18/2016   NA 139 10/18/2016   K 3.4 (L) 10/18/2016   CL 103 10/18/2016   CREATININE 1.45 10/18/2016   BUN 23 10/18/2016   CO2 26 10/18/2016   TSH 3.28 10/18/2016   PSA 1.18 10/07/2012   INR 1.0 08/15/2016   HGBA1C 7.2 (H) 10/18/2016   MICROALBUR 26.7 (H) 07/15/2016    Lab Results  Component Value Date   TSH 3.28 10/18/2016   Lab Results  Component Value Date   WBC 8.8 10/18/2016   HGB 15.0 10/18/2016   HCT 47.1 10/18/2016   MCV 85.2 10/18/2016   PLT 197.0 10/18/2016   Lab Results  Component Value Date   NA 139 10/18/2016   K 3.4 (L) 10/18/2016   CO2 26 10/18/2016   GLUCOSE 87 10/18/2016   BUN 23 10/18/2016   CREATININE 1.45 10/18/2016   BILITOT 0.7 10/18/2016   ALKPHOS 52 10/18/2016   AST 16 10/18/2016   ALT 16 10/18/2016   PROT 7.0 10/18/2016   ALBUMIN 3.7 10/18/2016   CALCIUM 9.4 10/18/2016   ANIONGAP 8 07/10/2016   GFR 49.62 (L) 10/18/2016   Lab Results    Component Value Date   CHOL 110 10/18/2016   Lab Results  Component Value Date   HDL 37.30 (L) 10/18/2016   Lab Results  Component Value Date   LDLCALC 39 10/18/2016   Lab Results  Component Value Date   TRIG 167.0 (H) 10/18/2016   Lab Results  Component Value Date   CHOLHDL 3 10/18/2016   Lab Results  Component Value Date   HGBA1C 7.2 (H) 10/18/2016       Assessment & Plan:   Problem List Items Addressed This Visit    Osteopenia    Encouraged DASH diet, decrease po intake and increase exercise as tolerated. Needs 7-8 hours of sleep nightly. Avoid trans fats, eat small, frequent meals every 4-5 hours with lean proteins, complex carbs and healthy fats. Minimize simple carbs, GMO foods.      Essential hypertension    Well controlled, no changes to meds. Encouraged heart healthy diet such as the DASH diet and exercise as tolerated.       Diabetes mellitus type 2 in obese (HCC)    hgba1c acceptable, minimize simple carbs. Increase exercise as tolerated. Continue current meds      Bradycardia    RRR toda      Debility - Primary    Referred to physical therapy for some supervised exercise to get his strength up.      Relevant Orders   Ambulatory referral to Physical Therapy   Chronic diastolic CHF (congestive heart failure) (HCC)    No recent exacerbations      Urinary frequency    Urine sampl unremaekable      Relevant Orders   Urinalysis (Completed)   Urine Culture (Completed)    Other Visit  Diagnoses    Hypokalemia       Relevant Orders   Comprehensive metabolic panel   Diabetes mellitus without complication ([place])       Relevant Orders   CBC w/Diff   TSH   Hemoglobin A1c   Hypertension, unspecified type       Relevant Orders   Comp Met (CMET)   CBC w/Diff   TSH   Hyperlipidemia associated with type 2 diabetes mellitus ([place])       Relevant Orders   Lipid panel      I have discontinued Mr. Garlow's Polyethyl Glycol-Propyl Glycol and  ciprofloxacin. I am also having him maintain his calcium carbonate, glucose blood, onetouch ultrasoft, JANUVIA, atorvastatin, triamterene-hydrochlorothiazide, amLODipine, metFORMIN, apixaban, furosemide, glipiZIDE, clotrimazole, and losartan.  No orders of the defined types were placed in this encounter.    [name], MD

## 2016-11-06 NOTE — Assessment & Plan Note (Signed)
Well controlled, no changes to meds. Encouraged heart healthy diet such as the DASH diet and exercise as tolerated.  °

## 2016-11-06 NOTE — Assessment & Plan Note (Signed)
Urine sampl unremaekable

## 2016-11-06 NOTE — Assessment & Plan Note (Signed)
RRR toda

## 2016-11-06 NOTE — Assessment & Plan Note (Signed)
hgba1c acceptable, minimize simple carbs. Increase exercise as tolerated. Continue current meds 

## 2016-11-06 NOTE — Assessment & Plan Note (Signed)
Encouraged DASH diet, decrease po intake and increase exercise as tolerated. Needs 7-8 hours of sleep nightly. Avoid trans fats, eat small, frequent meals every 4-5 hours with lean proteins, complex carbs and healthy fats. Minimize simple carbs, GMO foods. 

## 2016-11-06 NOTE — Assessment & Plan Note (Signed)
Referred to physical therapy for some supervised exercise to get his strength up.

## 2016-11-07 ENCOUNTER — Other Ambulatory Visit: Payer: Self-pay | Admitting: Family Medicine

## 2016-11-07 NOTE — Addendum Note (Signed)
Addended by: Caffie Pinto on: 11/07/2016 08:20 AM   Modules accepted: Orders

## 2016-11-08 ENCOUNTER — Other Ambulatory Visit: Payer: Self-pay

## 2016-11-08 MED ORDER — FUROSEMIDE 20 MG PO TABS: 20.0000 mg | ORAL_TABLET | Freq: Every day | ORAL | 2 refills | Status: DC | PRN

## 2016-11-08 NOTE — Telephone Encounter (Signed)
Faxed 30d+2 of Furosemide till next OV/thx dmf

## 2016-11-11 ENCOUNTER — Ambulatory Visit (INDEPENDENT_AMBULATORY_CARE_PROVIDER_SITE_OTHER): Payer: PPO | Admitting: Orthopaedic Surgery

## 2016-11-11 ENCOUNTER — Encounter (INDEPENDENT_AMBULATORY_CARE_PROVIDER_SITE_OTHER): Payer: Self-pay | Admitting: Orthopaedic Surgery

## 2016-11-11 VITALS — BP 146/79 | HR 77 | Resp 14 | Ht 72.0 in | Wt 225.0 lb

## 2016-11-11 DIAGNOSIS — M1712 Unilateral primary osteoarthritis, left knee: Secondary | ICD-10-CM | POA: Diagnosis not present

## 2016-11-11 MED ORDER — METHYLPREDNISOLONE ACETATE 40 MG/ML IJ SUSP
80.0000 mg | INTRAMUSCULAR | Status: AC | PRN
  Administered 2016-11-11: 80 mg

## 2016-11-11 MED ORDER — BUPIVACAINE HCL 0.5 % IJ SOLN
3.0000 mL | INTRAMUSCULAR | Status: AC | PRN
  Administered 2016-11-11: 3 mL via INTRA_ARTICULAR

## 2016-11-11 MED ORDER — LIDOCAINE HCL 1 % IJ SOLN
5.0000 mL | INTRAMUSCULAR | Status: AC | PRN
  Administered 2016-11-11: 5 mL

## 2016-11-11 NOTE — Progress Notes (Signed)
Office Visit Note   Patient: Vincent Allen           Date of Birth: 10/01/1933           MRN: [id number] Visit Date: 11/11/2016              Requested by: [name], MD [address] [place], [place] [address] PCP: [name], MD   Assessment & Plan: Visit Diagnoses:  1. Unilateral primary osteoarthritis, left knee   End-stage osteoarthritis left knee with CPPD  Plan: Cortisone injection left knee  Follow-Up Instructions: Return if symptoms worsen or fail to improve.   Orders:  No orders of the defined types were placed in this encounter.  No orders of the defined types were placed in this encounter.     Procedures: Large Joint Inj Date/Time: 11/11/2016 3:41 PM Performed by: [name] Authorized by: [name]   Consent Given by:  Patient Timeout: prior to procedure the correct patient, procedure, and site was verified   Indications:  Pain and joint swelling Location:  Knee Site:  L knee Prep: patient was prepped and draped in usual sterile fashion   Needle Size:  25 G Needle Length:  1.5 inches Approach:  Anteromedial Ultrasound Guidance: No   Fluoroscopic Guidance: No   Arthrogram: No   Medications:  5 mL lidocaine 1 %; 80 mg methylPREDNISolone acetate 40 MG/ML; 3 mL bupivacaine 0.5 % Aspiration Attempted: No   Patient tolerance:  Patient tolerated the procedure well with no immediate complications     Clinical Data: No additional findings.   Subjective: Chief Complaint  Patient presents with  . Left Knee - Pain, Edema, Weakness    Mr. Hoffmeier is an 64 y o that presents with chronic Left knee pain. Euflexxa series no relief. Cortisone prior to the Euflexx on 09/02/16 that provided relief.  Mr. Bogard is having recurrent episodes of pain in his left knee predominantly along the medial compartment. He does have end-stage osteoarthritis associated with CPPD and recently completed a course of Euflexxa. I fever  chills shortness of breath or chest pain  HPI  Review of Systems  Constitutional: Negative for fatigue.  HENT: Negative for hearing loss.   Respiratory: Negative for apnea, chest tightness and shortness of breath.   Cardiovascular: Negative for chest pain, palpitations and leg swelling.  Gastrointestinal: Negative for blood in stool, constipation and diarrhea.  Genitourinary: Negative for difficulty urinating.  Musculoskeletal: Negative for arthralgias, back pain, joint swelling, myalgias, neck pain and neck stiffness.  Neurological: Negative for weakness, numbness and headaches.  Hematological: Does not bruise/bleed easily.  Psychiatric/Behavioral: Negative for sleep disturbance. The patient is not nervous/anxious.      Objective: Vital Signs: BP (!) 146/79   Pulse 77   Resp 14   Ht 6' (1.829 m)   Wt 225 lb (102.1 kg)   BMI 30.52 kg/m   Physical Exam  Ortho Exam walks with a shuffling gait but without significant pain. Does have some discomfort along the medial aspect of his left knee. Lacks about 5-6 to full left knee extension and flexes about 105. No instability. Minimal effusion. No popliteal pain or mass. No swelling distally. Left foot is warm. Motor exam appears to be intact  Specialty Comments:  No specialty comments available.  Imaging: No results found.   PMFS History: Patient Active Problem List   Diagnosis Date Noted  . Urinary frequency 11/06/2016  . S/P placement of  cardiac pacemaker 10/24/2016  . Chronic diastolic CHF (congestive heart failure) ([place]) 10/24/2016  . Cardiac device in situ   . Atrial flutter ([place]) 08/15/2016  . Debility 08/15/2016  . Bradycardia 07/09/2016  . Persistent atrial fibrillation ([place]) 07/09/2016  . Vitamin D deficiency 07/17/2015  . Preventative health care 07/17/2015  . Lumbago 12/02/2014  . Acute subdural hematoma ([place]) 05/20/2014  . Fracture closed, nasal bone 05/19/2014  . Subdural hematoma ([place]) 05/19/2014  . Nasal  fracture 05/19/2014  . Pain of toe of left foot 03/14/2014  . Tinea pedis of left foot 03/14/2014  . Diabetes mellitus type 2 in obese ([place]) 12/12/2013  . Medicare annual wellness visit, subsequent 01/17/2013  . Essential hypertension 01/13/2013  . Chronic kidney disease stage III (GFR 30-59 ml/min) 05/03/2012  . Insomnia 06/27/2011  . Osteopenia 09/02/2010  . Family history of colon cancer 09/02/2010  . Diabetes mellitus with chronic kidney disease ([place])   . Hypertensive heart disease   . Hyperlipidemia   . Carotid artery disease ([place])   . GERD (gastroesophageal reflux disease)   . CTS (carpal tunnel syndrome) 08/28/2010   Past Medical History:  Diagnosis Date  . Arthritis   . Bradycardia   . Cataract   . Chronic kidney disease stage III (GFR 30-59 ml/min) 05/03/2012  . Colon polyps   . Complication of anesthesia    emesis  . Diabetes mellitus   . GERD (gastroesophageal reflux disease)   . HOH (hard of hearing)   . HTN (hypertension) 01/13/2013  . Hyperlipidemia   . Hypertension   . Persistent atrial fibrillation ([place])   . Presence of permanent cardiac pacemaker 09/20/2016  . Symptomatic bradycardia   . Vitamin D deficiency 07/17/2015    Family History  Problem Relation Age of Onset  . Cancer Mother        colon, breast, pancreas, skin cancer  . Heart disease Father        heart valve replaced  . Stroke Father   . Diabetes Father   . Cancer Paternal Grandmother        colon  . Obesity Son   . Heart disease Son        bradycardia    Past Surgical History:  Procedure Laterality Date  . ANKLE ARTHROPLASTY    . CARDIOVERSION N/A 08/20/2016   Procedure: CARDIOVERSION;  Surgeon: Sanda Klein, MD;  Location: [facility];  Service: Cardiovascular;  Laterality: N/A;  . CATARACT EXTRACTION  2010, 2014  . CATARACT EXTRACTION  02/28/14  . CHOLECYSTECTOMY    . CHOLECYSTECTOMY N/A 05/03/2012   Procedure: LAPAROSCOPIC CHOLECYSTECTOMY WITH INTRAOPERATIVE CHOLANGIOGRAM;   Surgeon: [name], MD;  Location: [facility];  Service: General;  Laterality: N/A;  . INSERT / REPLACE / REMOVE PACEMAKER  09/20/2016  . PACEMAKER IMPLANT N/A 09/20/2016   Procedure: Pacemaker Implant;  Surgeon: Deboraha Sprang, MD;  Location: [facility];  Service: Cardiovascular;  Laterality: N/A;  . ROTATOR CUFF REPAIR    . SHOULDER SURGERY    . TONSILLECTOMY     Social History   Occupational History  . Not on file.   Social History Main Topics  . Smoking status: Never Smoker  . Smokeless tobacco: Never Used  . Alcohol use 0.6 oz/week    1 Glasses of wine per week  . Drug use: No  . 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

## 2016-11-12 ENCOUNTER — Encounter: Payer: Self-pay | Admitting: Physical Therapy

## 2016-11-12 ENCOUNTER — Ambulatory Visit: Payer: PPO | Attending: Family Medicine | Admitting: Physical Therapy

## 2016-11-12 DIAGNOSIS — R262 Difficulty in walking, not elsewhere classified: Secondary | ICD-10-CM | POA: Diagnosis not present

## 2016-11-12 DIAGNOSIS — M6281 Muscle weakness (generalized): Secondary | ICD-10-CM | POA: Insufficient documentation

## 2016-11-12 NOTE — Patient Instructions (Signed)
   BRIDGING  While lying on your back, tighten your lower abdominals, squeeze your buttocks and then raise your buttocks off the floor/bed as creating a "Bridge" with your body.  Only go as high as possible without feeling tension in low back.  Hold and then lower yourself and repeat 2x10   SEATED MARCHING  While seated in a chair, lift up your foot and knee, set it down and then perform on the other leg. Repeat this alternating movement.  Repeat 20x each side    LONG ARC QUAD - LAQ - HIGH SEAT  While seated with your knee in a bent position, slowly straighten your knee as you raise your foot upwards as shown. Hold 3 seconds repeat 2x10 each side

## 2016-11-12 NOTE — Therapy (Signed)
[facility] [address], [place], [place], [address] Phone: [phone]   Fax:  [phone]  Physical Therapy Evaluation  Patient Details  Name: Vincent Allen MRN: [id number] Date of Birth: 05-03-1933 Referring Provider: [name] A  Encounter Date: 11/12/2016      PT End of Session - 11/12/16 1527    Visit Number 1   Number of Visits 10   Date for PT Re-Evaluation 01/07/17   PT Start Time 8850   PT Stop Time 1529   PT Time Calculation (min) 42 min   Activity Tolerance Patient tolerated treatment well   Behavior During Therapy [facility] for tasks assessed/performed      Past Medical History:  Diagnosis Date  . Arthritis   . Bradycardia   . Cataract   . Chronic kidney disease stage III (GFR 30-59 ml/min) 05/03/2012  . Colon polyps   . Complication of anesthesia    emesis  . Diabetes mellitus   . GERD (gastroesophageal reflux disease)   . HOH (hard of hearing)   . HTN (hypertension) 01/13/2013  . Hyperlipidemia   . Hypertension   . Persistent atrial fibrillation ([place])   . Presence of permanent cardiac pacemaker 09/20/2016  . Symptomatic bradycardia   . Vitamin D deficiency 07/17/2015    Past Surgical History:  Procedure Laterality Date  . ANKLE ARTHROPLASTY    . CARDIOVERSION N/A 08/20/2016   Procedure: CARDIOVERSION;  Surgeon: Sanda Klein, MD;  Location: [place] [facility];  Service: Cardiovascular;  Laterality: N/A;  . CATARACT EXTRACTION  2010, 2014  . CATARACT EXTRACTION  02/28/14  . CHOLECYSTECTOMY    . CHOLECYSTECTOMY N/A 05/03/2012   Procedure: LAPAROSCOPIC CHOLECYSTECTOMY WITH INTRAOPERATIVE CHOLANGIOGRAM;  Surgeon: [name], MD;  Location: [place];  Service: General;  Laterality: N/A;  . INSERT / REPLACE / REMOVE PACEMAKER  09/20/2016  . PACEMAKER IMPLANT N/A 09/20/2016   Procedure: Pacemaker Implant;  Surgeon: Deboraha Sprang, MD;  Location: [place] [facility];  Service: Cardiovascular;   Laterality: N/A;  . ROTATOR CUFF REPAIR    . SHOULDER SURGERY    . TONSILLECTOMY      There were no vitals filed for this visit.       Subjective Assessment - 11/12/16 1451    Subjective I fell in April and my heart rate with 35, so I got a Psychologist, forensic.  Now I have been having a very hard time getting up and down.  I haven't been active since December last year.  I do all sorts outdoor activities. I got cortisone shot in left knee yesterday.     Pertinent History chronic knee pain and low back pain; pace maker   Limitations Walking;Standing   Patient Stated Goals getting up and down better   Currently in Pain? No/denies            [facility] PT Assessment - 11/12/16 0001      Assessment   Medical Diagnosis R53.81 (ICD-10-CM) - Debility   Referring Provider [name] A   Onset Date/Surgical Date --  December   Prior Therapy Not for current issue     Precautions   Precautions None     Restrictions   Weight Bearing Restrictions No     Balance Screen   Has the patient fallen in the past 6 months Yes   How many times? [place] Alone  Prior Function   Level of Independence Independent     Cognition   Overall Cognitive Status Within Functional Limits for tasks assessed     Posture/Postural Control   Posture/Postural Control Postural limitations   Postural Limitations Rounded Shoulders;Increased thoracic kyphosis     ROM / Strength   AROM / PROM / Strength PROM;Strength     PROM   Overall PROM Comments hip internal rotation 50% limited     Strength   Strength Assessment Site Hip;Ankle   Right/Left Hip Left;Right   Right Hip Flexion 4+/5   Right Hip Extension 4/5   Right Hip External Rotation  5/5   Right Hip ABduction 4/5   Right Hip ADduction 4-/5   Left Hip Flexion 4-/5   Left Hip Extension 4/5   Left Hip External Rotation 3+/5   Left Hip ABduction 4/5   Left Hip ADduction  3+/5   Right/Left Ankle Left   Left Ankle Dorsiflexion 3+/5     Ambulation/Gait   Ambulation/Gait Yes   Ambulation/Gait Assistance 7: Independent   Ambulation Distance (Feet) 695 Feet   Gait Pattern Lateral trunk lean to right;Lateral trunk lean to left;Lateral hip instability;Decreased stride length;Decreased stance time - right;Decreased stance time - left;Decreased step length - right;Decreased step length - left;Trendelenburg  trendelenburg Lt> Rt   Ambulation Surface Level     6 minute walk test results    Aerobic Endurance Distance Walked 695   Endurance additional comments stopped at 3 min for standing rest break using UE     Timed Up and Go Test   Normal TUG (seconds) 21  needs UE sit <> stand            Objective measurements completed on examination: See above findings.          [place] Adult PT Treatment/Exercise - 11/12/16 0001      Exercises   Exercises Other Exercises   Other Exercises  educated and performed as seen in HEP     Knee/Hip Exercises: Aerobic   Nustep L1 x 8 min                PT Education - 11/12/16 1526    Education provided Yes   Education Details initial HEP   Person(s) Educated Patient   Methods Explanation;Demonstration;Verbal cues;Handout   Comprehension Verbalized understanding;Returned demonstration          PT Short Term Goals - 11/12/16 1540      PT SHORT TERM GOAL #1   Title independent with initial HEP   Time 4   Period Weeks   Status New   Target Date 12/10/16     PT SHORT TERM GOAL #2   Title TUG < or = to 18 sec for reduced risk of falls   Time 4   Period Weeks   Status New   Target Date 12/10/16     PT SHORT TERM GOAL #3   Title able to perform 6 min walk test without and stopping   Time 4   Period Weeks   Status New   Target Date 12/10/16           PT Long Term Goals - 11/12/16 1542      PT LONG TERM GOAL #1   Title independent with advanced HEP   Time 8   Period Weeks   Status  New   Target Date 01/07/17     PT LONG TERM GOAL #2   Title able to stand from  chair without using UE due to immporved LE strength   Time 8   Period Weeks   Status New   Target Date 01/07/17     PT LONG TERM GOAL #3   Title TUG < or = to 14 sec for decreased risk of falls   Time 8   Period Weeks   Status New   Target Date 01/07/17     PT LONG TERM GOAL #4   Title pt will reach > or = to 1000 feet with 6 min walk test due to improved endurance   Time 8   Period Weeks   Status New   Target Date 01/07/17                Plan - 11/26/2016 1538    Clinical Impression Statement Pt is well known to clinic, pleasant 83 y/o male who presents to clinic today due to weakness and decreased endurance since recent onset of health issues over the past 8 months.  Pt was sick in December, then had a fall in April due to heart condition.  He had pace maker implanted and his health is improved, however he is weak due to lack of activity.  Pt has bilateral LE weakness.  He is also at increased fall risk according to TUG and not meeting age related norms for endurance with 6 minute walk test.  Pt is normally very active and enjoys working outside.  He currently feels unsteady walking outside.  He is also having notable difficutly getting up from the chair.  He demonstrates poor posture and gait abnormalities.  Pt will benefit from skilled PT to address these impairments and return to maximum function and outdoor activiites.   History and Personal Factors relevant to plan of care: chronic back and knee pain, pace maker   Clinical Presentation Stable   Clinical Presentation due to: pt is stable   Clinical Decision Making Low   Rehab Potential Excellent   PT Frequency 2x / week   PT Duration 8 weeks   PT Treatment/Interventions ADLs/Self Care Home Management;Biofeedback;Cryotherapy;Electrical Stimulation;Moist Heat;Ultrasound;Gait training;Stair training;Functional mobility training;Therapeutic  activities;Therapeutic exercise;Balance training;Neuromuscular re-education;Patient/family education;Manual techniques;Passive range of motion;Dry needling;Taping   PT Next Visit Plan LE strength and endurance as tolerated, sit to stand from elevated surface   PT Home Exercise Plan progress as needed   Consulted and Agree with Plan of Care Patient      Patient will benefit from skilled therapeutic intervention in order to improve the following deficits and impairments:  Abnormal gait, Decreased activity tolerance, Decreased balance, Decreased range of motion, Decreased strength, Difficulty walking, Decreased endurance  Visit Diagnosis: Muscle weakness (generalized)  Difficulty in walking, not elsewhere classified      G-Codes - 2016/11/26 1530    Functional Assessment Tool Used (Outpatient Only) TUG, 6MWT, clinical impression   Functional Limitation Mobility: Walking and moving around   Mobility: Walking and Moving Around Current Status ([id number]) At least 80 percent but less than 100 percent impaired, limited or restricted   Mobility: Walking and Moving Around Goal Status [phone]) At least 60 percent but less than 80 percent impaired, limited or restricted       Problem List Patient Active Problem List   Diagnosis Date Noted  . Urinary frequency 11/06/2016  . S/P placement of cardiac pacemaker 10/24/2016  . Chronic diastolic CHF (congestive heart failure) ([place]) 10/24/2016  . Cardiac device in situ   . Atrial flutter ([place]) 08/15/2016  . Debility 08/15/2016  . Bradycardia  07/09/2016  . Persistent atrial fibrillation ([place]) 07/09/2016  . Vitamin D deficiency 07/17/2015  . Preventative health care 07/17/2015  . Lumbago 12/02/2014  . Acute subdural hematoma ([place]) 05/20/2014  . Fracture closed, nasal bone 05/19/2014  . Subdural hematoma ([place]) 05/19/2014  . Nasal fracture 05/19/2014  . Pain of toe of left foot 03/14/2014  . Tinea pedis of left foot 03/14/2014  . Diabetes mellitus type  2 in obese ([place]) 12/12/2013  . Medicare annual wellness visit, subsequent 01/17/2013  . Essential hypertension 01/13/2013  . Chronic kidney disease stage III (GFR 30-59 ml/min) 05/03/2012  . Insomnia 06/27/2011  . Osteopenia 09/02/2010  . Family history of colon cancer 09/02/2010  . Diabetes mellitus with chronic kidney disease ([place])   . Hypertensive heart disease   . Hyperlipidemia   . Carotid artery disease ([place])   . GERD (gastroesophageal reflux disease)   . CTS (carpal tunnel syndrome) 08/28/2010    Zannie Cove, PT 11/12/2016, 5:35 PM  [facility] [address] [place], [place], [address] Phone: [phone]   Fax:  [phone]  Name: Vincent Allen MRN: [id number] Date of Birth: [birth year]

## 2016-11-14 ENCOUNTER — Ambulatory Visit: Payer: PPO | Admitting: Physical Therapy

## 2016-11-14 DIAGNOSIS — M6281 Muscle weakness (generalized): Secondary | ICD-10-CM | POA: Diagnosis not present

## 2016-11-14 DIAGNOSIS — R262 Difficulty in walking, not elsewhere classified: Secondary | ICD-10-CM

## 2016-11-14 NOTE — Therapy (Signed)
[facility] [address], [place], [place], [address] Phone: [phone]   Fax:  [phone]  Physical Therapy Treatment  Patient Details  Name: Vincent Allen MRN: [id number] Date of Birth: 05/28/1933 Referring Provider: [name] A  Encounter Date: 11/14/2016      PT End of Session - 11/14/16 1011    Visit Number 2   Number of Visits 10   Date for PT Re-Evaluation 01/07/17   PT Start Time 1010   PT Stop Time 1049   PT Time Calculation (min) 39 min   Activity Tolerance Patient tolerated treatment well   Behavior During Therapy [facility] for tasks assessed/performed      Past Medical History:  Diagnosis Date  . Arthritis   . Bradycardia   . Cataract   . Chronic kidney disease stage III (GFR 30-59 ml/min) 05/03/2012  . Colon polyps   . Complication of anesthesia    emesis  . Diabetes mellitus   . GERD (gastroesophageal reflux disease)   . HOH (hard of hearing)   . HTN (hypertension) 01/13/2013  . Hyperlipidemia   . Hypertension   . Persistent atrial fibrillation ([place])   . Presence of permanent cardiac pacemaker 09/20/2016  . Symptomatic bradycardia   . Vitamin D deficiency 07/17/2015    Past Surgical History:  Procedure Laterality Date  . ANKLE ARTHROPLASTY    . CARDIOVERSION N/A 08/20/2016   Procedure: CARDIOVERSION;  Surgeon: Sanda Klein, MD;  Location: [place] [facility];  Service: Cardiovascular;  Laterality: N/A;  . CATARACT EXTRACTION  2010, 2014  . CATARACT EXTRACTION  02/28/14  . CHOLECYSTECTOMY    . CHOLECYSTECTOMY N/A 05/03/2012   Procedure: LAPAROSCOPIC CHOLECYSTECTOMY WITH INTRAOPERATIVE CHOLANGIOGRAM;  Surgeon: [name], MD;  Location: [place];  Service: General;  Laterality: N/A;  . INSERT / REPLACE / REMOVE PACEMAKER  09/20/2016  . PACEMAKER IMPLANT N/A 09/20/2016   Procedure: Pacemaker Implant;  Surgeon: Deboraha Sprang, MD;  Location: [place] [facility];  Service: Cardiovascular;   Laterality: N/A;  . ROTATOR CUFF REPAIR    . SHOULDER SURGERY    . TONSILLECTOMY      There were no vitals filed for this visit.      Subjective Assessment - 11/14/16 1014    Subjective My knee is a little sore from the eval.      Pertinent History chronic knee pain and low back pain; pace maker   Limitations Walking;Standing   Patient Stated Goals getting up and down better   Currently in Pain? No/denies                         [facility] Adult PT Treatment/Exercise - 11/14/16 0001      Knee/Hip Exercises: Aerobic   Nustep L1 x 10 min   Other Aerobic UBE lvl 2 backwards with core engaged for and cues for posture x 4 min     Knee/Hip Exercises: Supine   Short Arc Quad Sets Strengthening;Both;10 reps   Short Arc Quad Sets Limitations 2 lb x 3 sec hold   Hip Adduction Isometric Strengthening;Both;20 reps   Bridges Limitations 10x lifting hips - back started hurting, 10x pelvic tilts felt okay   Straight Leg Raises Strengthening;Both;2 sets;5 reps   Other Supine Knee/Hip Exercises hip abduction with green band - 10x each side     Ankle Exercises: Supine   T-Band dorsiflexion with green band - 20x each side  PT Education - 11/14/16 1052    Education provided Yes   Education Details ball squeeze, pelvic tilt, (previous bridge, LAQ, seated marching)   Person(s) Educated Patient   Methods Explanation;Demonstration;Verbal cues;Tactile cues;Handout   Comprehension Verbalized understanding;Returned demonstration          PT Short Term Goals - 11/12/16 1540      PT SHORT TERM GOAL #1   Title independent with initial HEP   Time 4   Period Weeks   Status New   Target Date 12/10/16     PT SHORT TERM GOAL #2   Title TUG < or = to 18 sec for reduced risk of falls   Time 4   Period Weeks   Status New   Target Date 12/10/16     PT SHORT TERM GOAL #3   Title able to perform 6 min walk test without and stopping   Time 4   Period Weeks    Status New   Target Date 12/10/16           PT Long Term Goals - 11/12/16 1542      PT LONG TERM GOAL #1   Title independent with advanced HEP   Time 8   Period Weeks   Status New   Target Date 01/07/17     PT LONG TERM GOAL #2   Title able to stand from chair without using UE due to immporved LE strength   Time 8   Period Weeks   Status New   Target Date 01/07/17     PT LONG TERM GOAL #3   Title TUG < or = to 14 sec for decreased risk of falls   Time 8   Period Weeks   Status New   Target Date 01/07/17     PT LONG TERM GOAL #4   Title pt will reach > or = to 1000 feet with 6 min walk test due to improved endurance   Time 8   Period Weeks   Status New   Target Date 01/07/17               Plan - 11/14/16 1012    Clinical Impression Statement No goals met yet due to first visit since eval.  Patient had some back pain with bridges so they were modified to pelvic tilts.  Pt was fatigued today and SAQ caused some irritation of knee towards the end of exercise.  Pt will benefit from skilled PT for overall strength and endurance.   PT Treatment/Interventions ADLs/Self Care Home Management;Biofeedback;Cryotherapy;Electrical Stimulation;Moist Heat;Ultrasound;Gait training;Stair training;Functional mobility training;Therapeutic activities;Therapeutic exercise;Balance training;Neuromuscular re-education;Patient/family education;Manual techniques;Passive range of motion;Dry needling;Taping   PT Next Visit Plan LE strength and endurance as tolerated   PT Home Exercise Plan progress as needed      Patient will benefit from skilled therapeutic intervention in order to improve the following deficits and impairments:  Abnormal gait, Decreased activity tolerance, Decreased balance, Decreased range of motion, Decreased strength, Difficulty walking, Decreased endurance  Visit Diagnosis: Muscle weakness (generalized)  Difficulty in walking, not elsewhere  classified     Problem List Patient Active Problem List   Diagnosis Date Noted  . Urinary frequency 11/06/2016  . S/P placement of cardiac pacemaker 10/24/2016  . Chronic diastolic CHF (congestive heart failure) ([place]) 10/24/2016  . Cardiac device in situ   . Atrial flutter ([place]) 08/15/2016  . Debility 08/15/2016  . Bradycardia 07/09/2016  . Persistent atrial fibrillation ([place]) 07/09/2016  . Vitamin D deficiency  07/17/2015  . Preventative health care 07/17/2015  . Lumbago 12/02/2014  . Acute subdural hematoma ([place]) 05/20/2014  . Fracture closed, nasal bone 05/19/2014  . Subdural hematoma ([place]) 05/19/2014  . Nasal fracture 05/19/2014  . Pain of toe of left foot 03/14/2014  . Tinea pedis of left foot 03/14/2014  . Diabetes mellitus type 2 in obese ([place]) 12/12/2013  . Medicare annual wellness visit, subsequent 01/17/2013  . Essential hypertension 01/13/2013  . Chronic kidney disease stage III (GFR 30-59 ml/min) 05/03/2012  . Insomnia 06/27/2011  . Osteopenia 09/02/2010  . Family history of colon cancer 09/02/2010  . Diabetes mellitus with chronic kidney disease ([place])   . Hypertensive heart disease   . Hyperlipidemia   . Carotid artery disease ([place])   . GERD (gastroesophageal reflux disease)   . CTS (carpal tunnel syndrome) 08/28/2010    Zannie Cove, PT 11/14/2016, 10:57 AM  [facility] [address] [place], [place], [address] Phone: [phone]   Fax:  [phone]  Name: Vincent Allen MRN: [id number] Date of Birth: 1933-11-12

## 2016-11-14 NOTE — Patient Instructions (Signed)
   HIP ADDUCTION SQUEEZE - SUPINE  Place a rolled up towel, ball or pillow between your knees and press your knees together so that you squeeze the object firmly. Hold 5 seconds and then release and repeat 10x    PELVIC TILT - SUPINE  Lie on your back with your knees bent. Next, arch your low back and then flatten it repeatedly. Your pelvis should tilt forward and back during the movement. Move through a comfortable range of motion. Repeat 20x

## 2016-11-20 ENCOUNTER — Ambulatory Visit: Payer: PPO | Attending: Family Medicine | Admitting: Physical Therapy

## 2016-11-20 DIAGNOSIS — R262 Difficulty in walking, not elsewhere classified: Secondary | ICD-10-CM | POA: Diagnosis not present

## 2016-11-20 DIAGNOSIS — M6281 Muscle weakness (generalized): Secondary | ICD-10-CM | POA: Diagnosis not present

## 2016-11-20 NOTE — Therapy (Signed)
[facility] [address] [place], [place], [address] Phone: [phone]   Fax:  [phone]  Physical Therapy Treatment  Patient Details  Name: Vincent Allen MRN: [id number] Date of Birth: 1933/07/01 Referring Provider: [name]  Encounter Date: 11/20/2016      PT End of Session - 11/20/16 0925    Visit Number 3   Number of Visits 10   Date for PT Re-Evaluation 01/07/17   PT Start Time 0850   PT Stop Time 0928   PT Time Calculation (min) 38 min   Activity Tolerance Patient tolerated treatment well   Behavior During Therapy [facility] for tasks assessed/performed      Past Medical History:  Diagnosis Date  . Arthritis   . Bradycardia   . Cataract   . Chronic kidney disease stage III (GFR 30-59 ml/min) 05/03/2012  . Colon polyps   . Complication of anesthesia    emesis  . Diabetes mellitus   . GERD (gastroesophageal reflux disease)   . HOH (hard of hearing)   . HTN (hypertension) 01/13/2013  . Hyperlipidemia   . Hypertension   . Persistent atrial fibrillation ([place])   . Presence of permanent cardiac pacemaker 09/20/2016  . Symptomatic bradycardia   . Vitamin D deficiency 07/17/2015    Past Surgical History:  Procedure Laterality Date  . ANKLE ARTHROPLASTY    . CARDIOVERSION N/A 08/20/2016   Procedure: CARDIOVERSION;  Surgeon: Sanda Klein, MD;  Location: [place] [facility];  Service: Cardiovascular;  Laterality: N/A;  . CATARACT EXTRACTION  2010, 2014  . CATARACT EXTRACTION  02/28/14  . CHOLECYSTECTOMY    . CHOLECYSTECTOMY N/A 05/03/2012   Procedure: LAPAROSCOPIC CHOLECYSTECTOMY WITH INTRAOPERATIVE CHOLANGIOGRAM;  Surgeon: [name], MD;  Location: [place];  Service: General;  Laterality: N/A;  . INSERT / REPLACE / REMOVE PACEMAKER  09/20/2016  . PACEMAKER IMPLANT N/A 09/20/2016   Procedure: Pacemaker Implant;  Surgeon: Deboraha Sprang, MD;  Location: [place] [facility];  Service: Cardiovascular;   Laterality: N/A;  . ROTATOR CUFF REPAIR    . SHOULDER SURGERY    . TONSILLECTOMY      There were no vitals filed for this visit.      Subjective Assessment - 11/20/16 0854    Subjective Pt reports that he was really sore for about 3 days following his last session. Because of this, he did not complete his exercises. No other concerns at this time.    Pertinent History chronic knee pain and low back pain; pace maker   Limitations Walking;Standing   Patient Stated Goals getting up and down better   Currently in Pain? No/denies                         [facility] Adult PT Treatment/Exercise - 11/20/16 0001      Knee/Hip Exercises: Stretches   Active Hamstring Stretch 2 reps;30 seconds   Active Hamstring Stretch Limitations supine, straight leg    Other Knee/Hip Stretches supine bent knee fallout stretch 5x10 sec each      Knee/Hip Exercises: Aerobic   Nustep L1 increased to L4 x5 min   Therapist present to gradually increase resistance     Knee/Hip Exercises: Standing   Knee Flexion Both;1 set;10 reps   Knee Flexion Limitations 2# weights      Knee/Hip Exercises: Seated   Other Seated Knee/Hip Exercises seated heel raises/ seated toe raises x20 reps each    Sit  to Sand 5 reps;without UE support  sitting foam pad      Knee/Hip Exercises: Supine   Bridges Limitations x10 reps, pelvic tilts posterior    Other Supine Knee/Hip Exercises single leg clams with yellow TB x10 reps each              Balance Exercises - 11/20/16 0913      Balance Exercises: Standing   Tandem Stance 2 reps;Eyes closed;30 secs           PT Education - 11/20/16 0926    Education provided Yes   Education Details implications for specific exercises completed and expectations of post workout soreness 1-2 days; technique with therex    Person(s) Educated Patient   Methods Explanation;Verbal cues;Tactile cues   Comprehension Verbalized understanding;Returned demonstration           PT Short Term Goals - 11/12/16 1540      PT SHORT TERM GOAL #1   Title independent with initial HEP   Time 4   Period Weeks   Status New   Target Date 12/10/16     PT SHORT TERM GOAL #2   Title TUG < or = to 18 sec for reduced risk of falls   Time 4   Period Weeks   Status New   Target Date 12/10/16     PT SHORT TERM GOAL #3   Title able to perform 6 min walk test without and stopping   Time 4   Period Weeks   Status New   Target Date 12/10/16           PT Long Term Goals - 11/12/16 1542      PT LONG TERM GOAL #1   Title independent with advanced HEP   Time 8   Period Weeks   Status New   Target Date 01/07/17     PT LONG TERM GOAL #2   Title able to stand from chair without using UE due to immporved LE strength   Time 8   Period Weeks   Status New   Target Date 01/07/17     PT LONG TERM GOAL #3   Title TUG < or = to 14 sec for decreased risk of falls   Time 8   Period Weeks   Status New   Target Date 01/07/17     PT LONG TERM GOAL #4   Title pt will reach > or = to 1000 feet with 6 min walk test due to improved endurance   Time 8   Period Weeks   Status New   Target Date 01/07/17               Plan - 11/20/16 0175    Clinical Impression Statement Pt arrived reporting signifcant increases in soreness for 3 days following his last session. Therapist discussed expectations of post-workout soreness for about 1-2 days following his sessions and reminded him of the importance of completing his HEP to decrease soreness intensity. Continued with therex to promote LE strength, providing pt with intermittent rest breaks and providing verbal/tactile cues as needed to decrease fatigue. Will continue with current POC to improve strenght, endurance and activity tolerance.    PT Treatment/Interventions ADLs/Self Care Home Management;Biofeedback;Cryotherapy;Electrical Stimulation;Moist Heat;Ultrasound;Gait training;Stair training;Functional mobility  training;Therapeutic activities;Therapeutic exercise;Balance training;Neuromuscular re-education;Patient/family education;Manual techniques;Passive range of motion;Dry needling;Taping   PT Next Visit Plan conitnue to progress LE strength and endurance; hip flexibility    PT Home Exercise Plan progress as needed  Consulted and Agree with Plan of Care Patient      Patient will benefit from skilled therapeutic intervention in order to improve the following deficits and impairments:  Abnormal gait, Decreased activity tolerance, Decreased balance, Decreased range of motion, Decreased strength, Difficulty walking, Decreased endurance  Visit Diagnosis: Muscle weakness (generalized)  Difficulty in walking, not elsewhere classified     Problem List Patient Active Problem List   Diagnosis Date Noted  . Urinary frequency 11/06/2016  . S/P placement of cardiac pacemaker 10/24/2016  . Chronic diastolic CHF (congestive heart failure) ([place]) 10/24/2016  . Cardiac device in situ   . Atrial flutter ([place]) 08/15/2016  . Debility 08/15/2016  . Bradycardia 07/09/2016  . Persistent atrial fibrillation ([place]) 07/09/2016  . Vitamin D deficiency 07/17/2015  . Preventative health care 07/17/2015  . Lumbago 12/02/2014  . Acute subdural hematoma ([place]) 05/20/2014  . Fracture closed, nasal bone 05/19/2014  . Subdural hematoma ([place]) 05/19/2014  . Nasal fracture 05/19/2014  . Pain of toe of left foot 03/14/2014  . Tinea pedis of left foot 03/14/2014  . Diabetes mellitus type 2 in obese ([place]) 12/12/2013  . Medicare annual wellness visit, subsequent 01/17/2013  . Essential hypertension 01/13/2013  . Chronic kidney disease stage III (GFR 30-59 ml/min) 05/03/2012  . Insomnia 06/27/2011  . Osteopenia 09/02/2010  . Family history of colon cancer 09/02/2010  . Diabetes mellitus with chronic kidney disease ([place])   . Hypertensive heart disease   . Hyperlipidemia   . Carotid artery disease ([place])   . GERD  (gastroesophageal reflux disease)   . CTS (carpal tunnel syndrome) 08/28/2010    Elly Modena PT, DPT 11/20/2016, 9:33 AM  [facility] [address], [place], [place], [address] Phone: [phone]   Fax:  [phone]  Name: TEVION LAFORGE MRN: [id number] Date of Birth: [birth year]

## 2016-11-22 ENCOUNTER — Ambulatory Visit: Payer: PPO | Admitting: Physical Therapy

## 2016-11-22 DIAGNOSIS — R262 Difficulty in walking, not elsewhere classified: Secondary | ICD-10-CM

## 2016-11-22 DIAGNOSIS — M6281 Muscle weakness (generalized): Secondary | ICD-10-CM

## 2016-11-22 NOTE — Therapy (Signed)
[facility] [address], [place], [place], [address] Phone: [phone]   Fax:  [phone]  Physical Therapy Treatment  Patient Details  Name: Vincent Allen MRN: [id number] Date of Birth: [birth year] Referring Provider: [name] A  Encounter Date: 11/22/2016      PT End of Session - 11/22/16 0900    Visit Number 4   Number of Visits 10   Date for PT Re-Evaluation 01/07/17   PT Start Time 0846   PT Stop Time 0928   PT Time Calculation (min) 42 min   Activity Tolerance Patient tolerated treatment well;No increased pain   Behavior During Therapy WFL for tasks assessed/performed      Past Medical History:  Diagnosis Date  . Arthritis   . Bradycardia   . Cataract   . Chronic kidney disease stage III (GFR 30-59 ml/min) 05/03/2012  . Colon polyps   . Complication of anesthesia    emesis  . Diabetes mellitus   . GERD (gastroesophageal reflux disease)   . HOH (hard of hearing)   . HTN (hypertension) 01/13/2013  . Hyperlipidemia   . Hypertension   . Persistent atrial fibrillation ([place])   . Presence of permanent cardiac pacemaker 09/20/2016  . Symptomatic bradycardia   . Vitamin D deficiency 07/17/2015    Past Surgical History:  Procedure Laterality Date  . ANKLE ARTHROPLASTY    . CARDIOVERSION N/A 08/20/2016   Procedure: CARDIOVERSION;  Surgeon: Sanda Klein, MD;  Location: [place] [facility];  Service: Cardiovascular;  Laterality: N/A;  . CATARACT EXTRACTION  2010, 2014  . CATARACT EXTRACTION  02/28/14  . CHOLECYSTECTOMY    . CHOLECYSTECTOMY N/A 05/03/2012   Procedure: LAPAROSCOPIC CHOLECYSTECTOMY WITH INTRAOPERATIVE CHOLANGIOGRAM;  Surgeon: [name], MD;  Location: [place];  Service: General;  Laterality: N/A;  . INSERT / REPLACE / REMOVE PACEMAKER  09/20/2016  . PACEMAKER IMPLANT N/A 09/20/2016   Procedure: Pacemaker Implant;  Surgeon: Deboraha Sprang, MD;  Location: [place] [facility];  Service:  Cardiovascular;  Laterality: N/A;  . ROTATOR CUFF REPAIR    . SHOULDER SURGERY    . TONSILLECTOMY      There were no vitals filed for this visit.      Subjective Assessment - 11/22/16 0857    Subjective Pt reports he is sore some today following his session 2 days ago. He did try to complete some of his exercises.    Pertinent History chronic knee pain and low back pain; pace maker   Limitations Walking;Standing   Patient Stated Goals getting up and down better   Currently in Pain? No/denies                  [facility] Adult PT Treatment/Exercise - 11/22/16 0001      Knee/Hip Exercises: Stretches   Other Knee/Hip Stretches supine bent knee fallout stretch 5x10 sec each    Other Knee/Hip Stretches SKTC 10x10 sec hold each LE with towel      Knee/Hip Exercises: Aerobic   Nustep L2 increased to L6 x5 min  PT present to gradually increase resistance     Knee/Hip Exercises: Seated   Long Arc Quad Both;1 set;15 reps;Weights   Long Arc Quad Weight 3 lbs.   Long [employer] Limitations sitting on foam pad and without UE support to encourage trunk activation   Heel Slides Both;10 reps;2 sets   Heel Slides Limitations double green TB    Other Seated Knee/Hip Exercises seated cone reach  forward across midline, pt sitting on dyna disc 2x10 reps each     Knee/Hip Exercises: Supine   Other Supine Knee/Hip Exercises single leg clams x10 reps each with red TB                PT Education - 11/22/16 0900    Education provided Yes   Education Details technique with HEP    Person(s) Educated Patient   Methods Explanation;Verbal cues   Comprehension Verbalized understanding;Returned demonstration          PT Short Term Goals - 11/12/16 1540      PT SHORT TERM GOAL #1   Title independent with initial HEP   Time 4   Period Weeks   Status New   Target Date 12/10/16     PT SHORT TERM GOAL #2   Title TUG < or = to 18 sec for reduced risk of falls   Time 4   Period  Weeks   Status New   Target Date 12/10/16     PT SHORT TERM GOAL #3   Title able to perform 6 min walk test without and stopping   Time 4   Period Weeks   Status New   Target Date 12/10/16           PT Long Term Goals - 11/12/16 1542      PT LONG TERM GOAL #1   Title independent with advanced HEP   Time 8   Period Weeks   Status New   Target Date 01/07/17     PT LONG TERM GOAL #2   Title able to stand from chair without using UE due to immporved LE strength   Time 8   Period Weeks   Status New   Target Date 01/07/17     PT LONG TERM GOAL #3   Title TUG < or = to 14 sec for decreased risk of falls   Time 8   Period Weeks   Status New   Target Date 01/07/17     PT LONG TERM GOAL #4   Title pt will reach > or = to 1000 feet with 6 min walk test due to improved endurance   Time 8   Period Weeks   Status New   Target Date 01/07/17               Plan - 11/22/16 0931    Clinical Impression Statement Pt with soreness following his last session, however was more consistent with completing his HEP despite this. Continued with therex to address LE and trunk weakness with only slight increases in resistance levels due to pt's report of soreness over the last 2 sessions. He was able to complete today's activity, reporting low back discomfort with single knee to chest stretch. No reports of pain by the end of the session despite this. Will continue with current POC.    PT Treatment/Interventions ADLs/Self Care Home Management;Biofeedback;Cryotherapy;Electrical Stimulation;Moist Heat;Ultrasound;Gait training;Stair training;Functional mobility training;Therapeutic activities;Therapeutic exercise;Balance training;Neuromuscular re-education;Patient/family education;Manual techniques;Passive range of motion;Dry needling;Taping   PT Next Visit Plan conitnue to progress LE strength and endurance; hip flexibility    PT Home Exercise Plan progress as needed   Consulted and Agree  with Plan of Care Patient      Patient will benefit from skilled therapeutic intervention in order to improve the following deficits and impairments:  Abnormal gait, Decreased activity tolerance, Decreased balance, Decreased range of motion, Decreased strength, Difficulty walking, Decreased endurance  Visit Diagnosis:  Muscle weakness (generalized)  Difficulty in walking, not elsewhere classified     Problem List Patient Active Problem List   Diagnosis Date Noted  . Urinary frequency 11/06/2016  . S/P placement of cardiac pacemaker 10/24/2016  . Chronic diastolic CHF (congestive heart failure) ([place]) 10/24/2016  . Cardiac device in situ   . Atrial flutter ([place]) 08/15/2016  . Debility 08/15/2016  . Bradycardia 07/09/2016  . Persistent atrial fibrillation ([place]) 07/09/2016  . Vitamin D deficiency 07/17/2015  . Preventative health care 07/17/2015  . Lumbago 12/02/2014  . Acute subdural hematoma ([place]) 05/20/2014  . Fracture closed, nasal bone 05/19/2014  . Subdural hematoma ([place]) 05/19/2014  . Nasal fracture 05/19/2014  . Pain of toe of left foot 03/14/2014  . Tinea pedis of left foot 03/14/2014  . Diabetes mellitus type 2 in obese ([place]) 12/12/2013  . Medicare annual wellness visit, subsequent 01/17/2013  . Essential hypertension 01/13/2013  . Chronic kidney disease stage III (GFR 30-59 ml/min) 05/03/2012  . Insomnia 06/27/2011  . Osteopenia 09/02/2010  . Family history of colon cancer 09/02/2010  . Diabetes mellitus with chronic kidney disease ([place])   . Hypertensive heart disease   . Hyperlipidemia   . Carotid artery disease ([place])   . GERD (gastroesophageal reflux disease)   . CTS (carpal tunnel syndrome) 08/28/2010   10:12 AM,11/22/16 Elly Modena PT, DPT [facility] [address] [place], [place], [address] Phone: [phone]   Fax:   [phone]  Name: Vincent Allen MRN: [id number] Date of Birth: 1933/06/02

## 2016-11-25 ENCOUNTER — Encounter: Payer: Self-pay | Admitting: Physical Therapy

## 2016-11-25 ENCOUNTER — Ambulatory Visit: Payer: PPO | Admitting: Physical Therapy

## 2016-11-25 DIAGNOSIS — R262 Difficulty in walking, not elsewhere classified: Secondary | ICD-10-CM

## 2016-11-25 DIAGNOSIS — M6281 Muscle weakness (generalized): Secondary | ICD-10-CM | POA: Diagnosis not present

## 2016-11-25 NOTE — Therapy (Addendum)
[facility] [address] [place], [place], [address] Phone: [phone]   Fax:  [phone]  Physical Therapy Treatment  Patient Details  Name: Vincent Allen MRN: [id number] Date of Birth: 1934/01/04 Referring Provider: [name] A  Encounter Date: 11/25/2016      PT End of Session - 11/25/16 0928    Visit Number 5   Number of Visits 10   Date for PT Re-Evaluation 01/07/17   PT Start Time 0929   PT Stop Time 1013   PT Time Calculation (min) 44 min   Activity Tolerance Patient tolerated treatment well;No increased pain   Behavior During Therapy WFL for tasks assessed/performed      Past Medical History:  Diagnosis Date  . Arthritis   . Bradycardia   . Cataract   . Chronic kidney disease stage III (GFR 30-59 ml/min) 05/03/2012  . Colon polyps   . Complication of anesthesia    emesis  . Diabetes mellitus   . GERD (gastroesophageal reflux disease)   . HOH (hard of hearing)   . HTN (hypertension) 01/13/2013  . Hyperlipidemia   . Hypertension   . Persistent atrial fibrillation ([place])   . Presence of permanent cardiac pacemaker 09/20/2016  . Symptomatic bradycardia   . Vitamin D deficiency 07/17/2015    Past Surgical History:  Procedure Laterality Date  . ANKLE ARTHROPLASTY    . CARDIOVERSION N/A 08/20/2016   Procedure: CARDIOVERSION;  Surgeon: Sanda Klein, MD;  Location: [place] [facility];  Service: Cardiovascular;  Laterality: N/A;  . CATARACT EXTRACTION  2010, 2014  . CATARACT EXTRACTION  02/28/14  . CHOLECYSTECTOMY    . CHOLECYSTECTOMY N/A 05/03/2012   Procedure: LAPAROSCOPIC CHOLECYSTECTOMY WITH INTRAOPERATIVE CHOLANGIOGRAM;  Surgeon: [name], MD;  Location: [place];  Service: General;  Laterality: N/A;  . INSERT / REPLACE / REMOVE PACEMAKER  09/20/2016  . PACEMAKER IMPLANT N/A 09/20/2016   Procedure: Pacemaker Implant;  Surgeon: Deboraha Sprang, MD;  Location: [place] [facility];  Service:  Cardiovascular;  Laterality: N/A;  . ROTATOR CUFF REPAIR    . SHOULDER SURGERY    . TONSILLECTOMY      There were no vitals filed for this visit.      Subjective Assessment - 11/25/16 0929    Subjective Pt states his back and knee is hurting.  Reports his back    Limitations Walking;Standing   Currently in Pain? Yes   Pain Score 5    Pain Location Back   Pain Orientation Mid   Pain Descriptors / Indicators Sore   Pain Type Chronic pain   Pain Onset More than a month ago   Pain Frequency Intermittent   Aggravating Factors  leaning forward, standing still   Pain Relieving Factors sitting back   Multiple Pain Sites No                         [facility] Adult PT Treatment/Exercise - 11/25/16 0001      Self-Care   Self-Care Posture   Posture education on posture and using towel roll to support low back     Knee/Hip Exercises: Stretches   Active Hamstring Stretch 2 reps;30 seconds  seated     Knee/Hip Exercises: Aerobic   Nustep L2 increased to L3 x 8 min seat 13 no UE     Knee/Hip Exercises: Standing   Heel Raises Both;10 reps   Hip ADduction Strengthening;10 reps;Right;Left     Knee/Hip  Exercises: Seated   Long Arc Quad Both;1 set;Weights;20 reps   Long Arc Quad Weight 3 lbs.   Long [employer] Limitations sitting on foam pad and with encouraged trunk activation   Ball Squeeze 5sec x 10  abdominal bracing   Marching Limitations marcing 20x each side   Hamstring Curl Strengthening;Right;Left;2 sets;10 reps   Hamstring Limitations green tband   Sit to Sand 5 reps;without UE support  sitting foam pad      Shoulder Exercises: Seated   Row Strengthening;Both;20 reps;Theraband   Theraband Level (Shoulder Row) Level 3 (Green)   Horizontal ABduction Strengthening;Both;10 reps;Theraband   Theraband Level (Shoulder Horizontal ABduction) Level 2 (Red)   Horizontal ABduction Weight (lbs) in 45 deg flexion; unable to get UE to shoulder height   External Rotation  Strengthening;Both;10 reps;Theraband   Theraband Level (Shoulder External Rotation) Level 2 (Red)   External Rotation Limitations small ROM due to right shoulder limitations                  PT Short Term Goals - 11/25/16 1017      PT SHORT TERM GOAL #1   Title independent with initial HEP   Time 4   Period Weeks   Status On-going     PT SHORT TERM GOAL #2   Title TUG < or = to 18 sec for reduced risk of falls   Time 4   Period Weeks   Status On-going     PT SHORT TERM GOAL #3   Title able to perform 6 min walk test without and stopping   Time 4   Period Weeks   Status On-going           PT Long Term Goals - 11/12/16 1542      PT LONG TERM GOAL #1   Title independent with advanced HEP   Time 8   Period Weeks   Status New   Target Date 01/07/17     PT LONG TERM GOAL #2   Title able to stand from chair without using UE due to immporved LE strength   Time 8   Period Weeks   Status New   Target Date 01/07/17     PT LONG TERM GOAL #3   Title TUG < or = to 14 sec for decreased risk of falls   Time 8   Period Weeks   Status New   Target Date 01/07/17     PT LONG TERM GOAL #4   Title pt will reach > or = to 1000 feet with 6 min walk test due to improved endurance   Time 8   Period Weeks   Status New   Target Date 01/07/17               Plan - 11/25/16 5638    Clinical Impression Statement Patient states he is tired at the end ofthe session today.  Pt did well and had no back pain throughout treatment as long as he had towel roll in lumbar.  Patient was unable to increase resistance but did add standing exercises.  Pt was monitored closely and encouraged to engage his core thoughout.  Pt had some cramping of adductors after treatment today.  Continue to need skilled PT due to decreased strength and needs postural strengthening.   Rehab Potential Excellent   PT Treatment/Interventions ADLs/Self Care Home  Management;Biofeedback;Cryotherapy;Electrical Stimulation;Moist Heat;Ultrasound;Gait training;Stair training;Functional mobility training;Therapeutic activities;Therapeutic exercise;Balance training;Neuromuscular re-education;Patient/family education;Manual techniques;Passive range of motion;Dry needling;Taping  PT Next Visit Plan conitnue to progress LE strength and endurance; hip flexibility    PT Home Exercise Plan progress as needed   Consulted and Agree with Plan of Care Patient      Patient will benefit from skilled therapeutic intervention in order to improve the following deficits and impairments:  Abnormal gait, Decreased activity tolerance, Decreased balance, Decreased range of motion, Decreased strength, Difficulty walking, Decreased endurance  Visit Diagnosis: Muscle weakness (generalized)  Difficulty in walking, not elsewhere classified     Problem List Patient Active Problem List   Diagnosis Date Noted  . Urinary frequency 11/06/2016  . S/P placement of cardiac pacemaker 10/24/2016  . Chronic diastolic CHF (congestive heart failure) ([place]) 10/24/2016  . Cardiac device in situ   . Atrial flutter ([place]) 08/15/2016  . Debility 08/15/2016  . Bradycardia 07/09/2016  . Persistent atrial fibrillation ([place]) 07/09/2016  . Vitamin D deficiency 07/17/2015  . Preventative health care 07/17/2015  . Lumbago 12/02/2014  . Acute subdural hematoma ([place]) 05/20/2014  . Fracture closed, nasal bone 05/19/2014  . Subdural hematoma ([place]) 05/19/2014  . Nasal fracture 05/19/2014  . Pain of toe of left foot 03/14/2014  . Tinea pedis of left foot 03/14/2014  . Diabetes mellitus type 2 in obese ([place]) 12/12/2013  . Medicare annual wellness visit, subsequent 01/17/2013  . Essential hypertension 01/13/2013  . Chronic kidney disease stage III (GFR 30-59 ml/min) 05/03/2012  . Insomnia 06/27/2011  . Osteopenia 09/02/2010  . Family history of colon cancer 09/02/2010  . Diabetes mellitus with  chronic kidney disease ([place])   . Hypertensive heart disease   . Hyperlipidemia   . Carotid artery disease ([place])   . GERD (gastroesophageal reflux disease)   . CTS (carpal tunnel syndrome) 08/28/2010    Zannie Cove, PT 11/25/2016, 12:23 PM  [facility] [address], [place], [place], [address] Phone: [phone]   Fax:  [phone]  Name: Vincent Allen MRN: [id number] Date of Birth: May 21, 1933

## 2016-11-27 ENCOUNTER — Encounter: Payer: Self-pay | Admitting: Podiatry

## 2016-11-27 ENCOUNTER — Ambulatory Visit (INDEPENDENT_AMBULATORY_CARE_PROVIDER_SITE_OTHER): Payer: PPO | Admitting: Podiatry

## 2016-11-27 DIAGNOSIS — M79674 Pain in right toe(s): Secondary | ICD-10-CM

## 2016-11-27 DIAGNOSIS — M79675 Pain in left toe(s): Secondary | ICD-10-CM

## 2016-11-27 DIAGNOSIS — B351 Tinea unguium: Secondary | ICD-10-CM | POA: Diagnosis not present

## 2016-11-27 NOTE — Progress Notes (Signed)
Subjective:    Patient ID: Vincent Allen, male   DOB: 83 y.o.   MRN: [id number]   HPI patient stating that his nails have been thickened and sore and he cannot wear shoe gear comfortably    ROS      Objective:  Physical Exam neurovascular status found in tact with thick yellow brittle nailbeds 1-5 both feet that are painful   Assessment:   Mycotic nail infection with pain 1-5 both feet      Plan:    Debris painful nailbeds 1-5 both feet with no iatrogenic bleeding noted

## 2016-11-29 ENCOUNTER — Ambulatory Visit: Payer: PPO | Admitting: Physical Therapy

## 2016-11-29 ENCOUNTER — Encounter: Payer: Self-pay | Admitting: Physical Therapy

## 2016-11-29 DIAGNOSIS — M6281 Muscle weakness (generalized): Secondary | ICD-10-CM | POA: Diagnosis not present

## 2016-11-29 DIAGNOSIS — R262 Difficulty in walking, not elsewhere classified: Secondary | ICD-10-CM

## 2016-11-29 NOTE — Therapy (Signed)
[facility] [address] [place], [place], [address] Phone: [phone]   Fax:  [phone]  Physical Therapy Treatment  Patient Details  Name: Vincent Allen MRN: [id number] Date of Birth: 1933/07/03 Referring Provider: [name] A  Encounter Date: 11/29/2016      PT End of Session - 11/29/16 0933    Visit Number 6   Number of Visits 10   Date for PT Re-Evaluation 01/07/17   PT Start Time 0930   PT Stop Time 1010   PT Time Calculation (min) 40 min   Activity Tolerance Patient tolerated treatment well;No increased pain   Behavior During Therapy WFL for tasks assessed/performed      Past Medical History:  Diagnosis Date  . Arthritis   . Bradycardia   . Cataract   . Chronic kidney disease stage III (GFR 30-59 ml/min) 05/03/2012  . Colon polyps   . Complication of anesthesia    emesis  . Diabetes mellitus   . GERD (gastroesophageal reflux disease)   . HOH (hard of hearing)   . HTN (hypertension) 01/13/2013  . Hyperlipidemia   . Hypertension   . Persistent atrial fibrillation ([place])   . Presence of permanent cardiac pacemaker 09/20/2016  . Symptomatic bradycardia   . Vitamin D deficiency 07/17/2015    Past Surgical History:  Procedure Laterality Date  . ANKLE ARTHROPLASTY    . CARDIOVERSION N/A 08/20/2016   Procedure: CARDIOVERSION;  Surgeon: Sanda Klein, MD;  Location: [place] [facility];  Service: Cardiovascular;  Laterality: N/A;  . CATARACT EXTRACTION  2010, 2014  . CATARACT EXTRACTION  02/28/14  . CHOLECYSTECTOMY    . CHOLECYSTECTOMY N/A 05/03/2012   Procedure: LAPAROSCOPIC CHOLECYSTECTOMY WITH INTRAOPERATIVE CHOLANGIOGRAM;  Surgeon: [name], MD;  Location: [place];  Service: General;  Laterality: N/A;  . INSERT / REPLACE / REMOVE PACEMAKER  09/20/2016  . PACEMAKER IMPLANT N/A 09/20/2016   Procedure: Pacemaker Implant;  Surgeon: Deboraha Sprang, MD;  Location: [place] [facility];  Service:  Cardiovascular;  Laterality: N/A;  . ROTATOR CUFF REPAIR    . SHOULDER SURGERY    . TONSILLECTOMY      There were no vitals filed for this visit.      Subjective Assessment - 11/29/16 0933    Subjective Pt states his back is not hurting this morning.     Pertinent History chronic knee pain and low back pain; pace maker   Limitations Walking;Standing   Patient Stated Goals getting up and down better   Currently in Pain? No/denies                         [facility] Adult PT Treatment/Exercise - 11/29/16 0001      Lumbar Exercises: Supine   Clam 20 reps  green band   Heel Slides 15 reps   Other Supine Lumbar Exercises hip drop out with core contraction - 5x each side     Knee/Hip Exercises: Stretches   Active Hamstring Stretch 2 reps;30 seconds  supine     Knee/Hip Exercises: Aerobic   Nustep L2 x 8 min   seat 12 no UE     Knee/Hip Exercises: Seated   Long Arc Quad Both;1 set;Weights;20 reps   Long Arc Quad Weight 3 lbs.   Ball Squeeze 5sec x 10  abdominal bracing   Marching Limitations marcing 15x each side   Hamstring Curl Strengthening;Right;Left;15 reps;1 set   Hamstring Limitations green tband  Sit to Sand 5 reps  sitting foam pad; minimal UE 15-20%     Knee/Hip Exercises: Supine   Hip Adduction Isometric Strengthening;Both;20 reps   Bridges Limitations pelvic tilts posterior x20 reps                  PT Short Term Goals - 11/25/16 1017      PT SHORT TERM GOAL #1   Title independent with initial HEP   Time 4   Period Weeks   Status On-going     PT SHORT TERM GOAL #2   Title TUG < or = to 18 sec for reduced risk of falls   Time 4   Period Weeks   Status On-going     PT SHORT TERM GOAL #3   Title able to perform 6 min walk test without and stopping   Time 4   Period Weeks   Status On-going           PT Long Term Goals - 11/12/16 1542      PT LONG TERM GOAL #1   Title independent with advanced HEP   Time 8   Period  Weeks   Status New   Target Date 01/07/17     PT LONG TERM GOAL #2   Title able to stand from chair without using UE due to immporved LE strength   Time 8   Period Weeks   Status New   Target Date 01/07/17     PT LONG TERM GOAL #3   Title TUG < or = to 14 sec for decreased risk of falls   Time 8   Period Weeks   Status New   Target Date 01/07/17     PT LONG TERM GOAL #4   Title pt will reach > or = to 1000 feet with 6 min walk test due to improved endurance   Time 8   Period Weeks   Status New   Target Date 01/07/17               Plan - 11/29/16 1012    Clinical Impression Statement Patient was feeling better today, but did not push it with the exercises due to his back pain getting inflamed easily.  All exercises performed with cues for abdominal contraction for improved lumbar stability.  Pt was limited by fatigue with exercises but able to perform exercises correctly with guidance.  He has difficulty maintaing hook lying position for a long time and needs breaks throughout treatment.  He was able to increase to 2 sets of sit to stand today.  No complaints of pain with treatment today.   PT Treatment/Interventions ADLs/Self Care Home Management;Biofeedback;Cryotherapy;Electrical Stimulation;Moist Heat;Ultrasound;Gait training;Stair training;Functional mobility training;Therapeutic activities;Therapeutic exercise;Balance training;Neuromuscular re-education;Patient/family education;Manual techniques;Passive range of motion;Dry needling;Taping   PT Next Visit Plan conitnue to progress LE strength and endurance; hip flexibility    PT Home Exercise Plan progress as needed   Consulted and Agree with Plan of Care Patient      Patient will benefit from skilled therapeutic intervention in order to improve the following deficits and impairments:  Abnormal gait, Decreased activity tolerance, Decreased balance, Decreased range of motion, Decreased strength, Difficulty walking,  Decreased endurance  Visit Diagnosis: Muscle weakness (generalized)  Difficulty in walking, not elsewhere classified     Problem List Patient Active Problem List   Diagnosis Date Noted  . Urinary frequency 11/06/2016  . S/P placement of cardiac pacemaker 10/24/2016  . Chronic diastolic CHF (congestive heart  failure) ([place]) 10/24/2016  . Cardiac device in situ   . Atrial flutter ([place]) 08/15/2016  . Debility 08/15/2016  . Bradycardia 07/09/2016  . Persistent atrial fibrillation ([place]) 07/09/2016  . Vitamin D deficiency 07/17/2015  . Preventative health care 07/17/2015  . Lumbago 12/02/2014  . Acute subdural hematoma ([place]) 05/20/2014  . Fracture closed, nasal bone 05/19/2014  . Subdural hematoma ([place]) 05/19/2014  . Nasal fracture 05/19/2014  . Pain of toe of left foot 03/14/2014  . Tinea pedis of left foot 03/14/2014  . Diabetes mellitus type 2 in obese ([place]) 12/12/2013  . Medicare annual wellness visit, subsequent 01/17/2013  . Essential hypertension 01/13/2013  . Chronic kidney disease stage III (GFR 30-59 ml/min) 05/03/2012  . Insomnia 06/27/2011  . Osteopenia 09/02/2010  . Family history of colon cancer 09/02/2010  . Diabetes mellitus with chronic kidney disease ([place])   . Hypertensive heart disease   . Hyperlipidemia   . Carotid artery disease ([place])   . GERD (gastroesophageal reflux disease)   . CTS (carpal tunnel syndrome) 08/28/2010    Zannie Cove, PT 11/29/2016, 10:16 AM  [facility] [address] [place], [place], [address] Phone: [phone]   Fax:  [phone]  Name: Vincent Allen MRN: [id number] Date of Birth: 08-01-1933

## 2016-12-02 ENCOUNTER — Ambulatory Visit: Payer: PPO | Admitting: Physical Therapy

## 2016-12-02 DIAGNOSIS — M6281 Muscle weakness (generalized): Secondary | ICD-10-CM | POA: Diagnosis not present

## 2016-12-02 DIAGNOSIS — R262 Difficulty in walking, not elsewhere classified: Secondary | ICD-10-CM

## 2016-12-02 NOTE — Therapy (Signed)
[facility] [address], [place], [place], [address] Phone: [phone]   Fax:  [phone]  Physical Therapy Treatment  Patient Details  Name: Vincent Allen MRN: [id number] Date of Birth: Oct 25, 1933 Referring Provider: [name] A  Encounter Date: 12/02/2016      PT End of Session - 12/02/16 1103    Visit Number 7   Number of Visits 10   Date for PT Re-Evaluation 01/07/17   PT Start Time 1100   PT Stop Time 1142   PT Time Calculation (min) 42 min   Activity Tolerance Patient tolerated treatment well;No increased pain   Behavior During Therapy WFL for tasks assessed/performed      Past Medical History:  Diagnosis Date  . Arthritis   . Bradycardia   . Cataract   . Chronic kidney disease stage III (GFR 30-59 ml/min) 05/03/2012  . Colon polyps   . Complication of anesthesia    emesis  . Diabetes mellitus   . GERD (gastroesophageal reflux disease)   . HOH (hard of hearing)   . HTN (hypertension) 01/13/2013  . Hyperlipidemia   . Hypertension   . Persistent atrial fibrillation ([place])   . Presence of permanent cardiac pacemaker 09/20/2016  . Symptomatic bradycardia   . Vitamin D deficiency 07/17/2015    Past Surgical History:  Procedure Laterality Date  . ANKLE ARTHROPLASTY    . CARDIOVERSION N/A 08/20/2016   Procedure: CARDIOVERSION;  Surgeon: Sanda Klein, MD;  Location: [place] [facility];  Service: Cardiovascular;  Laterality: N/A;  . CATARACT EXTRACTION  2010, 2014  . CATARACT EXTRACTION  02/28/14  . CHOLECYSTECTOMY    . CHOLECYSTECTOMY N/A 05/03/2012   Procedure: LAPAROSCOPIC CHOLECYSTECTOMY WITH INTRAOPERATIVE CHOLANGIOGRAM;  Surgeon: [name], MD;  Location: [place] [facility];  Service: General;  Laterality: N/A;  . INSERT / REPLACE / REMOVE PACEMAKER  09/20/2016  . PACEMAKER IMPLANT N/A 09/20/2016   Procedure: Pacemaker Implant;  Surgeon: Deboraha Sprang, MD;  Location: [place] [facility];  Service:  Cardiovascular;  Laterality: N/A;  . ROTATOR CUFF REPAIR    . SHOULDER SURGERY    . TONSILLECTOMY      There were no vitals filed for this visit.      Subjective Assessment - 12/02/16 1100    Subjective Pt reports that things are going well. He has no pain currently.    Pertinent History chronic knee pain and low back pain; pace maker   Limitations Walking;Standing   Patient Stated Goals getting up and down better   Currently in Pain? No/denies                         [facility] Adult PT Treatment/Exercise - 12/02/16 0001      Exercises   Other Exercises  seated low rows and high rows with green TB 2x15 reps each      Knee/Hip Exercises: Stretches   Active Hamstring Stretch 2 reps;Both;30 seconds   Active Hamstring Stretch Limitations seated     Knee/Hip Exercises: Standing   Knee Flexion Both;2 sets;10 reps   Knee Flexion Limitations facing countertop to prevent hip flexion compensation   Other Standing Knee Exercises side stepping along countertop x4 RT      Knee/Hip Exercises: Seated   Long Arc Quad 20 reps;Both;1 set   Long Arc Quad Weight 4 lbs.   Ball Squeeze x15 reps, 5 sec hold, abdominal bracing    Other Seated Knee/Hip Exercises ankle PF  with double green TB x20 reps    Marching Limitations 2x15 reps each side, intermittent cues for improved technique   Marching Weights 4 lbs.   Sit to Sand 1 set;10 reps;without UE support  sitting on foam pad                 PT Education - 12/02/16 1107    Education provided Yes   Education Details technique with HEP; addition to HEP   Person(s) Educated Patient   Methods Explanation;Verbal cues;Handout   Comprehension Verbalized understanding;Returned demonstration          PT Short Term Goals - 11/25/16 1017      PT SHORT TERM GOAL #1   Title independent with initial HEP   Time 4   Period Weeks   Status On-going     PT SHORT TERM GOAL #2   Title TUG < or = to 18 sec for reduced risk of  falls   Time 4   Period Weeks   Status On-going     PT SHORT TERM GOAL #3   Title able to perform 6 min walk test without and stopping   Time 4   Period Weeks   Status On-going           PT Long Term Goals - 11/12/16 1542      PT LONG TERM GOAL #1   Title independent with advanced HEP   Time 8   Period Weeks   Status New   Target Date 01/07/17     PT LONG TERM GOAL #2   Title able to stand from chair without using UE due to immporved LE strength   Time 8   Period Weeks   Status New   Target Date 01/07/17     PT LONG TERM GOAL #3   Title TUG < or = to 14 sec for decreased risk of falls   Time 8   Period Weeks   Status New   Target Date 01/07/17     PT LONG TERM GOAL #4   Title pt will reach > or = to 1000 feet with 6 min walk test due to improved endurance   Time 8   Period Weeks   Status New   Target Date 01/07/17               Plan - 12/02/16 1124    Clinical Impression Statement Continued this session with therex to promote pt's LE strength and activity tolerance. He was able to complete several exercises with increased reps and resistance, reporting muscle fatigue only during today's session. Ended with an addition to pt's HEP with good demonstration of technique. Will continue with current POC.   PT Treatment/Interventions ADLs/Self Care Home Management;Biofeedback;Cryotherapy;Electrical Stimulation;Moist Heat;Ultrasound;Gait training;Stair training;Functional mobility training;Therapeutic activities;Therapeutic exercise;Balance training;Neuromuscular re-education;Patient/family education;Manual techniques;Passive range of motion;Dry needling;Taping   PT Next Visit Plan conitnue to progress LE strength and endurance; hip flexibility    PT Home Exercise Plan added standing hamstring curls 9/17   Consulted and Agree with Plan of Care Patient      Patient will benefit from skilled therapeutic intervention in order to improve the following deficits and  impairments:  Abnormal gait, Decreased activity tolerance, Decreased balance, Decreased range of motion, Decreased strength, Difficulty walking, Decreased endurance  Visit Diagnosis: Muscle weakness (generalized)  Difficulty in walking, not elsewhere classified     Problem List Patient Active Problem List   Diagnosis Date Noted  . Urinary frequency 11/06/2016  .  S/P placement of cardiac pacemaker 10/24/2016  . Chronic diastolic CHF (congestive heart failure) ([place]) 10/24/2016  . Cardiac device in situ   . Atrial flutter ([place]) 08/15/2016  . Debility 08/15/2016  . Bradycardia 07/09/2016  . Persistent atrial fibrillation ([place]) 07/09/2016  . Vitamin D deficiency 07/17/2015  . Preventative health care 07/17/2015  . Lumbago 12/02/2014  . Acute subdural hematoma ([place]) 05/20/2014  . Fracture closed, nasal bone 05/19/2014  . Subdural hematoma ([place]) 05/19/2014  . Nasal fracture 05/19/2014  . Pain of toe of left foot 03/14/2014  . Tinea pedis of left foot 03/14/2014  . Diabetes mellitus type 2 in obese ([place]) 12/12/2013  . Medicare annual wellness visit, subsequent 01/17/2013  . Essential hypertension 01/13/2013  . Chronic kidney disease stage III (GFR 30-59 ml/min) 05/03/2012  . Insomnia 06/27/2011  . Osteopenia 09/02/2010  . Family history of colon cancer 09/02/2010  . Diabetes mellitus with chronic kidney disease ([place])   . Hypertensive heart disease   . Hyperlipidemia   . Carotid artery disease ([place])   . GERD (gastroesophageal reflux disease)   . CTS (carpal tunnel syndrome) 08/28/2010    11:43 AM,12/02/16 Elly Modena PT, DPT [facility] [address] [place], [place], [address] Phone: [phone]   Fax:  [phone]  Name: Vincent Allen MRN: [id number] Date of Birth: 12/11/1933

## 2016-12-03 ENCOUNTER — Other Ambulatory Visit: Payer: Self-pay | Admitting: Family Medicine

## 2016-12-04 ENCOUNTER — Ambulatory Visit: Payer: PPO | Admitting: Physical Therapy

## 2016-12-04 DIAGNOSIS — M6281 Muscle weakness (generalized): Secondary | ICD-10-CM | POA: Diagnosis not present

## 2016-12-04 DIAGNOSIS — R262 Difficulty in walking, not elsewhere classified: Secondary | ICD-10-CM

## 2016-12-04 NOTE — Therapy (Signed)
[facility] [address], [place], [place], [address] Phone: [phone]   Fax:  [phone]  Physical Therapy Treatment  Patient Details  Name: Vincent Allen MRN: [id number] Date of Birth: 1933-07-30 Referring Provider: [name] A  Encounter Date: 12/04/2016      PT End of Session - 12/04/16 1233    Visit Number 8   Number of Visits 10   Date for PT Re-Evaluation 01/07/17   PT Start Time 8502   PT Stop Time 1226   PT Time Calculation (min) 41 min   Activity Tolerance Patient tolerated treatment well;No increased pain   Behavior During Therapy WFL for tasks assessed/performed      Past Medical History:  Diagnosis Date  . Arthritis   . Bradycardia   . Cataract   . Chronic kidney disease stage III (GFR 30-59 ml/min) 05/03/2012  . Colon polyps   . Complication of anesthesia    emesis  . Diabetes mellitus   . GERD (gastroesophageal reflux disease)   . HOH (hard of hearing)   . HTN (hypertension) 01/13/2013  . Hyperlipidemia   . Hypertension   . Persistent atrial fibrillation ([place])   . Presence of permanent cardiac pacemaker 09/20/2016  . Symptomatic bradycardia   . Vitamin D deficiency 07/17/2015    Past Surgical History:  Procedure Laterality Date  . ANKLE ARTHROPLASTY    . CARDIOVERSION N/A 08/20/2016   Procedure: CARDIOVERSION;  Surgeon: Sanda Klein, MD;  Location: [facility];  Service: Cardiovascular;  Laterality: N/A;  . CATARACT EXTRACTION  2010, 2014  . CATARACT EXTRACTION  02/28/14  . CHOLECYSTECTOMY    . CHOLECYSTECTOMY N/A 05/03/2012   Procedure: LAPAROSCOPIC CHOLECYSTECTOMY WITH INTRAOPERATIVE CHOLANGIOGRAM;  Surgeon: [name], MD;  Location: [place] [facility];  Service: General;  Laterality: N/A;  . INSERT / REPLACE / REMOVE PACEMAKER  09/20/2016  . PACEMAKER IMPLANT N/A 09/20/2016   Procedure: Pacemaker Implant;  Surgeon: Deboraha Sprang, MD;  Location: [facility];  Service:  Cardiovascular;  Laterality: N/A;  . ROTATOR CUFF REPAIR    . SHOULDER SURGERY    . TONSILLECTOMY      There were no vitals filed for this visit.      Subjective Assessment - 12/04/16 1149    Subjective Pt reports that things are going well. His back is a little sore today.    Pertinent History chronic knee pain and low back pain; pace maker   Limitations Walking;Standing   Patient Stated Goals getting up and down better   Currently in Pain? Yes   Pain Score 3    Pain Location Back   Pain Orientation Mid;Lower   Pain Descriptors / Indicators Sore   Pain Type Chronic pain   Pain Onset More than a month ago   Pain Frequency Intermittent   Aggravating Factors  heavy activity    Pain Relieving Factors resting                          [facility] Adult PT Treatment/Exercise - 12/04/16 0001      Knee/Hip Exercises: Stretches   Active Hamstring Stretch 5 reps;10 seconds;Both   Active Hamstring Stretch Limitations supine with towel    Passive Hamstring Stretch Both;2 reps;20 seconds   Passive Hamstring Stretch Limitations supine    Piriformis Stretch 3 reps;20 seconds;Both   Piriformis Stretch Limitations PROM      Knee/Hip Exercises: Seated   Long Arc [employer]  2 sets;15 reps   Long Arc Quad Weight 5 lbs.   Marching Limitations x20 reps each side, intermittent cues for improved technique   Marching Weights 5 lbs.   Hamstring Curl Strengthening;1 set;15 reps   Hamstring Limitations double blue TB. pt complaining of cramp in Rt hamstring     Sit to Sand 2 sets;10 reps;without UE support;Other (comment)  sitting on foam pad                 PT Education - 12/04/16 1233    Education provided Yes   Education Details technique with HEP    Person(s) Educated Patient   Methods Explanation;Tactile cues   Comprehension Verbalized understanding;Returned demonstration          PT Short Term Goals - 11/25/16 1017      PT SHORT TERM GOAL #1   Title independent  with initial HEP   Time 4   Period Weeks   Status On-going     PT SHORT TERM GOAL #2   Title TUG < or = to 18 sec for reduced risk of falls   Time 4   Period Weeks   Status On-going     PT SHORT TERM GOAL #3   Title able to perform 6 min walk test without and stopping   Time 4   Period Weeks   Status On-going           PT Long Term Goals - 11/12/16 1542      PT LONG TERM GOAL #1   Title independent with advanced HEP   Time 8   Period Weeks   Status New   Target Date 01/07/17     PT LONG TERM GOAL #2   Title able to stand from chair without using UE due to immporved LE strength   Time 8   Period Weeks   Status New   Target Date 01/07/17     PT LONG TERM GOAL #3   Title TUG < or = to 14 sec for decreased risk of falls   Time 8   Period Weeks   Status New   Target Date 01/07/17     PT LONG TERM GOAL #4   Title pt will reach > or = to 1000 feet with 6 min walk test due to improved endurance   Time 8   Period Weeks   Status New   Target Date 01/07/17               Plan - 12/04/16 1234    Clinical Impression Statement Pt continues to complete LE strengthening therex with increase in resistance and reps. Pt was able to complete 2 sets of sit to stands, however he does need to sit on a foam pad to do this successfully. Ended session with passive and active stretching to improve hip mobility, noting continued limitations with this. Ended without report of soreness, pt noting muscle fatigue only. Will continue to progress towards goals with strengthening and mobility activity as pt is able.    PT Treatment/Interventions ADLs/Self Care Home Management;Biofeedback;Cryotherapy;Electrical Stimulation;Moist Heat;Ultrasound;Gait training;Stair training;Functional mobility training;Therapeutic activities;Therapeutic exercise;Balance training;Neuromuscular re-education;Patient/family education;Manual techniques;Passive range of motion;Dry needling;Taping   PT Next Visit  Plan conitnue to progress LE strength and endurance; postural strengthening; hip flexibility    PT Home Exercise Plan added standing hamstring curls 9/17   Consulted and Agree with Plan of Care Patient      Patient will benefit from skilled therapeutic intervention in order to  improve the following deficits and impairments:  Abnormal gait, Decreased activity tolerance, Decreased balance, Decreased range of motion, Decreased strength, Difficulty walking, Decreased endurance  Visit Diagnosis: Muscle weakness (generalized)  Difficulty in walking, not elsewhere classified     Problem List Patient Active Problem List   Diagnosis Date Noted  . Urinary frequency 11/06/2016  . S/P placement of cardiac pacemaker 10/24/2016  . Chronic diastolic CHF (congestive heart failure) ([place]) 10/24/2016  . Cardiac device in situ   . Atrial flutter ([place]) 08/15/2016  . Debility 08/15/2016  . Bradycardia 07/09/2016  . Persistent atrial fibrillation ([place]) 07/09/2016  . Vitamin D deficiency 07/17/2015  . Preventative health care 07/17/2015  . Lumbago 12/02/2014  . Acute subdural hematoma ([place]) 05/20/2014  . Fracture closed, nasal bone 05/19/2014  . Subdural hematoma ([place]) 05/19/2014  . Nasal fracture 05/19/2014  . Pain of toe of left foot 03/14/2014  . Tinea pedis of left foot 03/14/2014  . Diabetes mellitus type 2 in obese ([place]) 12/12/2013  . Medicare annual wellness visit, subsequent 01/17/2013  . Essential hypertension 01/13/2013  . Chronic kidney disease stage III (GFR 30-59 ml/min) 05/03/2012  . Insomnia 06/27/2011  . Osteopenia 09/02/2010  . Family history of colon cancer 09/02/2010  . Diabetes mellitus with chronic kidney disease ([place])   . Hypertensive heart disease   . Hyperlipidemia   . Carotid artery disease ([place])   . GERD (gastroesophageal reflux disease)   . CTS (carpal tunnel syndrome) 08/28/2010    12:47 PM,12/04/16 Elly Modena PT, DPT [place] [facility]  [facility] [address], [place], [place], [address] Phone: [phone]   Fax:  [phone]  Name: Vincent Allen MRN: [id number] Date of Birth: [birth year]

## 2016-12-05 ENCOUNTER — Other Ambulatory Visit: Payer: PPO

## 2016-12-05 ENCOUNTER — Ambulatory Visit (INDEPENDENT_AMBULATORY_CARE_PROVIDER_SITE_OTHER): Payer: PPO | Admitting: Family Medicine

## 2016-12-05 VITALS — BP 126/73 | HR 73

## 2016-12-05 DIAGNOSIS — I10 Essential (primary) hypertension: Secondary | ICD-10-CM | POA: Diagnosis not present

## 2016-12-05 LAB — BASIC METABOLIC PANEL
BUN: 37 mg/dL — ABNORMAL HIGH (ref 6–23)
CALCIUM: 9.2 mg/dL (ref 8.4–10.5)
CO2: 28 meq/L (ref 19–32)
CREATININE: 1.82 mg/dL — AB (ref 0.40–1.50)
Chloride: 102 mEq/L (ref 96–112)
GFR: 38.16 mL/min — AB (ref >60.00)
GLUCOSE: 195 mg/dL — AB (ref 70–99)
Potassium: 3.9 mEq/L (ref 3.5–5.1)
Sodium: 138 mEq/L (ref 135–145)

## 2016-12-05 NOTE — Progress Notes (Signed)
Pre visit review using our clinic tool,if applicable. No additional management support is needed unless otherwise documented below in the visit note.   Patient in for BP check per order from Dr. [name] dated 11/04/16. Patient had a recent medication change.  Patient has voiced no complaints today.  BP = 126/73 P= 73  Per Dr. [name] BP ok patient just needs to have labwork today and return on next scheduled visit. Patient agreed.

## 2016-12-06 NOTE — Addendum Note (Signed)
Addended by: Magdalene Molly A on: 12/06/2016 11:51 AM   Modules accepted: Orders

## 2016-12-09 ENCOUNTER — Ambulatory Visit: Payer: PPO | Admitting: Physical Therapy

## 2016-12-09 DIAGNOSIS — M6281 Muscle weakness (generalized): Secondary | ICD-10-CM

## 2016-12-09 DIAGNOSIS — R262 Difficulty in walking, not elsewhere classified: Secondary | ICD-10-CM

## 2016-12-09 NOTE — Progress Notes (Signed)
Nurseblood pressure check note reviewed. Agree with documention and plan. 

## 2016-12-09 NOTE — Therapy (Signed)
[facility] [address] [place], [place], [address] Phone: [phone]   Fax:  [phone]  Physical Therapy Treatment  Patient Details  Name: Vincent Allen MRN: [id number] Date of Birth: 09-Aug-1933 Referring Provider: [name] A  Encounter Date: 12/09/2016      PT End of Session - 12/09/16 0843    Visit Number 9   Number of Visits 10   Date for PT Re-Evaluation 01/07/17   PT Start Time 0845   PT Stop Time 0925   PT Time Calculation (min) 40 min   Activity Tolerance Patient tolerated treatment well;No increased pain   Behavior During Therapy WFL for tasks assessed/performed      Past Medical History:  Diagnosis Date  . Arthritis   . Bradycardia   . Cataract   . Chronic kidney disease stage III (GFR 30-59 ml/min) 05/03/2012  . Colon polyps   . Complication of anesthesia    emesis  . Diabetes mellitus   . GERD (gastroesophageal reflux disease)   . HOH (hard of hearing)   . HTN (hypertension) 01/13/2013  . Hyperlipidemia   . Hypertension   . Persistent atrial fibrillation ([place])   . Presence of permanent cardiac pacemaker 09/20/2016  . Symptomatic bradycardia   . Vitamin D deficiency 07/17/2015    Past Surgical History:  Procedure Laterality Date  . ANKLE ARTHROPLASTY    . CARDIOVERSION N/A 08/20/2016   Procedure: CARDIOVERSION;  Surgeon: Sanda Klein, MD;  Location: [facility];  Service: Cardiovascular;  Laterality: N/A;  . CATARACT EXTRACTION  2010, 2014  . CATARACT EXTRACTION  02/28/14  . CHOLECYSTECTOMY    . CHOLECYSTECTOMY N/A 05/03/2012   Procedure: LAPAROSCOPIC CHOLECYSTECTOMY WITH INTRAOPERATIVE CHOLANGIOGRAM;  Surgeon: [name], MD;  Location: [facility];  Service: General;  Laterality: N/A;  . INSERT / REPLACE / REMOVE PACEMAKER  09/20/2016  . PACEMAKER IMPLANT N/A 09/20/2016   Procedure: Pacemaker Implant;  Surgeon: Deboraha Sprang, MD;  Location: [facility];  Service:  Cardiovascular;  Laterality: N/A;  . ROTATOR CUFF REPAIR    . SHOULDER SURGERY    . TONSILLECTOMY      There were no vitals filed for this visit.      Subjective Assessment - 12/09/16 0845    Subjective Pt reports that he did a good bit of walking yesterday and was pretty tired following. He has no complaints of pain at this time.    Pertinent History chronic knee pain and low back pain; pace maker   Limitations Walking;Standing   Patient Stated Goals getting up and down better   Currently in Pain? No/denies   Pain Onset More than a month ago                         [facility] Adult PT Treatment/Exercise - 12/09/16 0001      Knee/Hip Exercises: Aerobic   Nustep L2, increased 1 level every 2 min, 6 min total      Knee/Hip Exercises: Machines for Strengthening   Cybex Leg Press Seat 9: BLE x20 reps, 60#, increased to 65# for second set x20 reps      Knee/Hip Exercises: Standing   Knee Flexion Both;10 reps;3 sets   Knee Flexion Limitations facing countertop    Other Standing Knee Exercises sidesteppping along countertop with red TB around ankles x3 RT    Other Standing Knee Exercises Ipsilateral LE/UE march with 2# ankle weights, x7 reps each  first set and x10 reps reach second set      Knee/Hip Exercises: Seated   Clamshell with TheraBand Blue  2x15    Other Seated Knee/Hip Exercises ankle PF with double blue TB x20 reps    Sit to Sand 10 reps;without UE support  end of session and sitting on 2" box                 PT Education - 12/09/16 0926    Education provided Yes   Education Details Addition to HEP; technique with therex    Person(s) Educated Patient   Methods Explanation;Verbal cues;Handout   Comprehension Verbalized understanding          PT Short Term Goals - 11/25/16 1017      PT SHORT TERM GOAL #1   Title independent with initial HEP   Time 4   Period Weeks   Status On-going     PT SHORT TERM GOAL #2   Title TUG < or = to 18  sec for reduced risk of falls   Time 4   Period Weeks   Status On-going     PT SHORT TERM GOAL #3   Title able to perform 6 min walk test without and stopping   Time 4   Period Weeks   Status On-going           PT Long Term Goals - 11/12/16 1542      PT LONG TERM GOAL #1   Title independent with advanced HEP   Time 8   Period Weeks   Status New   Target Date 01/07/17     PT LONG TERM GOAL #2   Title able to stand from chair without using UE due to immporved LE strength   Time 8   Period Weeks   Status New   Target Date 01/07/17     PT LONG TERM GOAL #3   Title TUG < or = to 14 sec for decreased risk of falls   Time 8   Period Weeks   Status New   Target Date 01/07/17     PT LONG TERM GOAL #4   Title pt will reach > or = to 1000 feet with 6 min walk test due to improved endurance   Time 8   Period Weeks   Status New   Target Date 01/07/17               Plan - 12/09/16 9518    Clinical Impression Statement Pt continues to report HEP adherence at home, no specific complaints of discomfort in the back or knees upon arrival. Session continued with focus on Nustep to progress LE endurance as well as LE strengthening progressions to closed chain/standing positions. Pt demonstrating limitations in technique with hip extension and knee flexion exercises secondary to both weakness and limited hip flexibility however he was able to complete exercises with fewer rest breaks. Will continue with therex to progress both. Ended without any complaints, other than fatigue.    PT Treatment/Interventions ADLs/Self Care Home Management;Biofeedback;Cryotherapy;Electrical Stimulation;Moist Heat;Ultrasound;Gait training;Stair training;Functional mobility training;Therapeutic activities;Therapeutic exercise;Balance training;Neuromuscular re-education;Patient/family education;Manual techniques;Passive range of motion;Dry needling;Taping   PT Next Visit Plan conitnue to progress LE  strength and endurance; postural strengthening; hip flexibility    PT Home Exercise Plan standing hamstring curls, ankle PF with blue TB     Consulted and Agree with Plan of Care Patient      Patient will benefit from skilled therapeutic intervention in  order to improve the following deficits and impairments:  Abnormal gait, Decreased activity tolerance, Decreased balance, Decreased range of motion, Decreased strength, Difficulty walking, Decreased endurance  Visit Diagnosis: Muscle weakness (generalized)  Difficulty in walking, not elsewhere classified     Problem List Patient Active Problem List   Diagnosis Date Noted  . Urinary frequency 11/06/2016  . S/P placement of cardiac pacemaker 10/24/2016  . Chronic diastolic CHF (congestive heart failure) ([place]) 10/24/2016  . Cardiac device in situ   . Atrial flutter ([place]) 08/15/2016  . Debility 08/15/2016  . Bradycardia 07/09/2016  . Persistent atrial fibrillation ([place]) 07/09/2016  . Vitamin D deficiency 07/17/2015  . Preventative health care 07/17/2015  . Lumbago 12/02/2014  . Acute subdural hematoma ([place]) 05/20/2014  . Fracture closed, nasal bone 05/19/2014  . Subdural hematoma ([place]) 05/19/2014  . Nasal fracture 05/19/2014  . Pain of toe of left foot 03/14/2014  . Tinea pedis of left foot 03/14/2014  . Diabetes mellitus type 2 in obese ([place]) 12/12/2013  . Medicare annual wellness visit, subsequent 01/17/2013  . Essential hypertension 01/13/2013  . Chronic kidney disease stage III (GFR 30-59 ml/min) 05/03/2012  . Insomnia 06/27/2011  . Osteopenia 09/02/2010  . Family history of colon cancer 09/02/2010  . Diabetes mellitus with chronic kidney disease ([place])   . Hypertensive heart disease   . Hyperlipidemia   . Carotid artery disease ([place])   . GERD (gastroesophageal reflux disease)   . CTS (carpal tunnel syndrome) 08/28/2010    9:29 AM,12/09/16 Elly Modena PT, DPT [facility] [address] [place], [place], [address] Phone: [phone]   Fax:  [phone]  Name: Vincent Allen MRN: [id number] Date of Birth: 18-Apr-1933

## 2016-12-11 ENCOUNTER — Ambulatory Visit: Payer: PPO | Admitting: Physical Therapy

## 2016-12-11 DIAGNOSIS — M6281 Muscle weakness (generalized): Secondary | ICD-10-CM | POA: Diagnosis not present

## 2016-12-11 DIAGNOSIS — R262 Difficulty in walking, not elsewhere classified: Secondary | ICD-10-CM

## 2016-12-11 NOTE — Therapy (Signed)
[facility] [address], [place], [place], [address] Phone: [phone]   Fax:  [phone]  Physical Therapy Treatment/G-codes and Reassessment Patient Details  Name: Vincent Allen MRN: [id number] Date of Birth: 03-09-1934 Referring Provider: [name] A  Encounter Date: 12/11/2016      PT End of Session - 12/11/16 1142    Visit Number 10   Number of Visits 20   Date for PT Re-Evaluation 01/07/17   PT Start Time 1017   PT Stop Time 1100   PT Time Calculation (min) 43 min   Activity Tolerance Patient tolerated treatment well;No increased pain   Behavior During Therapy WFL for tasks assessed/performed      Past Medical History:  Diagnosis Date  . Arthritis   . Bradycardia   . Cataract   . Chronic kidney disease stage III (GFR 30-59 ml/min) 05/03/2012  . Colon polyps   . Complication of anesthesia    emesis  . Diabetes mellitus   . GERD (gastroesophageal reflux disease)   . HOH (hard of hearing)   . HTN (hypertension) 01/13/2013  . Hyperlipidemia   . Hypertension   . Persistent atrial fibrillation ([place])   . Presence of permanent cardiac pacemaker 09/20/2016  . Symptomatic bradycardia   . Vitamin D deficiency 07/17/2015    Past Surgical History:  Procedure Laterality Date  . ANKLE ARTHROPLASTY    . CARDIOVERSION N/A 08/20/2016   Procedure: CARDIOVERSION;  Surgeon: Sanda Klein, MD;  Location: [facility];  Service: Cardiovascular;  Laterality: N/A;  . CATARACT EXTRACTION  2010, 2014  . CATARACT EXTRACTION  02/28/14  . CHOLECYSTECTOMY    . CHOLECYSTECTOMY N/A 05/03/2012   Procedure: LAPAROSCOPIC CHOLECYSTECTOMY WITH INTRAOPERATIVE CHOLANGIOGRAM;  Surgeon: [name], MD;  Location: [facility];  Service: General;  Laterality: N/A;  . INSERT / REPLACE / REMOVE PACEMAKER  09/20/2016  . PACEMAKER IMPLANT N/A 09/20/2016   Procedure: Pacemaker Implant;  Surgeon: Deboraha Sprang, MD;  Location: [facility];  Service: Cardiovascular;  Laterality: N/A;  . ROTATOR CUFF REPAIR    . SHOULDER SURGERY    . TONSILLECTOMY      There were no vitals filed for this visit.      Subjective Assessment - 12/11/16 1018    Subjective Pt reports that things are going well. Pt feels that he has made some progress since beginning therapy, approximately 50%. He is completing his exercises at home and some days is able to walk more than others during his daily activity.    Pertinent History chronic knee pain and low back pain; pace maker   Limitations Walking;Standing   Patient Stated Goals getting up and down better   Currently in Pain? No/denies   Pain Onset More than a month ago            [facility] PT Assessment - 12/11/16 0001      Assessment   Medical Diagnosis R53.81 (ICD-10-CM) - Debility   Onset Date/Surgical Date --  December   Prior Therapy Not for current issue     Precautions   Precautions None     Restrictions   Weight Bearing Restrictions No     Balance Screen   Has the patient fallen in the past 6 months Yes   How many times? 1  none since starting therapy     [place]  Level of Independence Independent     Cognition   Overall Cognitive Status Within Functional Limits for tasks assessed     Posture/Postural Control   Posture/Postural Control Postural limitations   Postural Limitations Rounded Shoulders;Increased thoracic kyphosis     PROM   Overall PROM Comments hip internal rotation 50% limited     Strength   Right Hip Flexion 4+/5   Right Hip Extension 4/5   Right Hip External Rotation  5/5   Right Hip ABduction 4/5   Right Hip ADduction 4-/5   Left Hip Flexion 4+/5   Left Hip Extension 4/5   Left Hip External Rotation 5/5   Left Hip ABduction 4/5   Left Hip ADduction 4-/5   Left Ankle Dorsiflexion 4+/5     Transfers   Five time sit to stand  comments  18 sec, with BUE support on armrests      Ambulation/Gait   Ambulation/Gait Yes   Ambulation/Gait Assistance 7: Independent   Ambulation Distance (Feet) 640 Feet   Gait Pattern Lateral trunk lean to right;Lateral trunk lean to left;Lateral hip instability;Decreased stride length;Decreased stance time - right;Decreased stance time - left;Decreased step length - right;Decreased step length - left;Trendelenburg  trendelenburg Lt> Rt     6 minute walk test results    Aerobic Endurance Distance Walked 640  8 laps   Endurance additional comments stopped at 5 min for standing rest break      Timed Up and Go Test   Normal TUG (seconds) 13.2  needs UE sit <> stand                     [facility] Adult PT Treatment/Exercise - 12/11/16 0001      Knee/Hip Exercises: Standing   Hip Extension Both;10 reps;2 sets   Other Standing Knee Exercises sidestepping at countertop with green TB around ankles x4 RT      Knee/Hip Exercises: Seated   Clamshell with TheraBand Blue  2x15                PT Education - 12/11/16 1142    Education provided Yes   Education Details discussed improvements in mobility, strength and activity tolerance; addition to HEP and review of technique   Person(s) Educated Patient   Methods Explanation;Verbal cues;Handout   Comprehension Verbalized understanding;Returned demonstration          PT Short Term Goals - 12/11/16 1047      PT SHORT TERM GOAL #1   Title independent with initial HEP   Time 4   Period Weeks   Status On-going     PT SHORT TERM GOAL #2   Title TUG < or = to 18 sec for reduced risk of falls   Time 4   Period Weeks   Status Achieved     PT SHORT TERM GOAL #3   Title able to perform 6 min walk test without and stopping   Baseline Stopped at 5 min   Time 4   Period Weeks   Status Partially Met           PT Long Term Goals - 12/11/16 1143      PT LONG TERM GOAL #1   Title independent with advanced HEP    Time 8   Period Weeks   Status On-going     PT LONG TERM GOAL #2   Title able to stand from chair without using UE due to immporved LE strength   Baseline still  requires UE support   Time 8   Period Weeks   Status New     PT LONG TERM GOAL #3   Title TUG < or = to 14 sec for decreased risk of falls   Baseline 13 sec, UE use    Time 8   Period Weeks   Status Achieved     PT LONG TERM GOAL #4   Title pt will reach > or = to 1000 feet with 6 min walk test due to improved endurance   Time 8   Period Weeks   Status On-going               Plan - 12/30/16 1148    Clinical Impression Statement Pt was reassessed this session having demonstrated improvements towards his short and long term goals since the start of PT several weeks ago. He demonstrates improved performance on the TUG, improved endurance on 6MWT and improved BLE strength via MMT. Pt has been adhering to his HEP, updated periodically, and feels that he is about 50% improved with his steadiness, mobility and activity tolerance. Although making progress, pt continues to demonstrate limitations in hip flexibility, proximal hip strength and endurance, requiring 1 rest break during the 6 MWT. Therapist reviewed reassessment findings with the pt and updated his HEP to reflect improvements in BLE strength. He was able to demonstrate good understanding of these additions. Pt would continue to benefit from skilled PT to address his limitations and promote safety and independence with his daily activity.    PT Treatment/Interventions ADLs/Self Care Home Management;Biofeedback;Cryotherapy;Electrical Stimulation;Moist Heat;Ultrasound;Gait training;Stair training;Functional mobility training;Therapeutic activities;Therapeutic exercise;Balance training;Neuromuscular re-education;Patient/family education;Manual techniques;Passive range of motion;Dry needling;Taping   PT Next Visit Plan conitnue to progress LE strength: abd/flex/ext and  endurance (walking-sit to stand); hip flexibility    PT Home Exercise Plan standing hamstring curls, ankle PF with blue TB , sit to stand   Consulted and Agree with Plan of Care Patient      Patient will benefit from skilled therapeutic intervention in order to improve the following deficits and impairments:  Abnormal gait, Decreased activity tolerance, Decreased balance, Decreased range of motion, Decreased strength, Difficulty walking, Decreased endurance  Visit Diagnosis: Muscle weakness (generalized)  Difficulty in walking, not elsewhere classified       G-Codes - December 30, 2016 1144    Functional Assessment Tool Used (Outpatient Only) TUG, 6MWT, clinical impression   Functional Limitation Mobility: Walking and moving around   Mobility: Walking and Moving Around Current Status ([id number]) At least 60 percent but less than 80 percent impaired, limited or restricted   Mobility: Walking and Moving Around Goal Status [phone]) At least 60 percent but less than 80 percent impaired, limited or restricted      Problem List Patient Active Problem List   Diagnosis Date Noted  . Urinary frequency 11/06/2016  . S/P placement of cardiac pacemaker 10/24/2016  . Chronic diastolic CHF (congestive heart failure) ([place]) 10/24/2016  . Cardiac device in situ   . Atrial flutter ([place]) 08/15/2016  . Debility 08/15/2016  . Bradycardia 07/09/2016  . Persistent atrial fibrillation ([place]) 07/09/2016  . Vitamin D deficiency 07/17/2015  . Preventative health care 07/17/2015  . Lumbago 12/02/2014  . Acute subdural hematoma ([place]) 05/20/2014  . Fracture closed, nasal bone 05/19/2014  . Subdural hematoma ([place]) 05/19/2014  . Nasal fracture 05/19/2014  . Pain of toe of left foot 03/14/2014  . Tinea pedis of left foot 03/14/2014  . Diabetes mellitus type 2 in obese ([place]) 12/12/2013  .  Medicare annual wellness visit, subsequent 01/17/2013  . Essential hypertension 01/13/2013  . Chronic kidney disease stage III (GFR  30-59 ml/min) 05/03/2012  . Insomnia 06/27/2011  . Osteopenia 09/02/2010  . Family history of colon cancer 09/02/2010  . Diabetes mellitus with chronic kidney disease ([place])   . Hypertensive heart disease   . Hyperlipidemia   . Carotid artery disease ([place])   . GERD (gastroesophageal reflux disease)   . CTS (carpal tunnel syndrome) 08/28/2010    11:53 AM,12/11/16 Elly Modena PT, DPT [facility] [address], [place], [place], [address] Phone: [phone]   Fax:  [phone]  Name: Vincent Allen MRN: [id number] Date of Birth: 04/28/1933

## 2016-12-16 ENCOUNTER — Ambulatory Visit: Payer: PPO | Attending: Family Medicine | Admitting: Physical Therapy

## 2016-12-16 DIAGNOSIS — M6281 Muscle weakness (generalized): Secondary | ICD-10-CM | POA: Diagnosis not present

## 2016-12-16 DIAGNOSIS — R262 Difficulty in walking, not elsewhere classified: Secondary | ICD-10-CM | POA: Diagnosis not present

## 2016-12-16 DIAGNOSIS — Z950 Presence of cardiac pacemaker: Secondary | ICD-10-CM

## 2016-12-16 HISTORY — DX: Presence of cardiac pacemaker: Z95.0

## 2016-12-16 NOTE — Therapy (Signed)
[facility] [address] [place], [place], [address] Phone: [phone]   Fax:  [phone]  Physical Therapy Treatment  Patient Details  Name: Vincent Allen MRN: [id number] Date of Birth: 1934/02/15 Referring Provider: [name] A  Encounter Date: 12/16/2016      PT End of Session - 12/16/16 1036    Visit Number 11   Number of Visits 20   Date for PT Re-Evaluation 01/07/17   PT Start Time 8182   PT Stop Time 9937   PT Time Calculation (min) 40 min   Activity Tolerance Patient tolerated treatment well;No increased pain   Behavior During Therapy WFL for tasks assessed/performed      Past Medical History:  Diagnosis Date  . Arthritis   . Bradycardia   . Cataract   . Chronic kidney disease stage III (GFR 30-59 ml/min) 05/03/2012  . Colon polyps   . Complication of anesthesia    emesis  . Diabetes mellitus   . GERD (gastroesophageal reflux disease)   . HOH (hard of hearing)   . HTN (hypertension) 01/13/2013  . Hyperlipidemia   . Hypertension   . Persistent atrial fibrillation ([place])   . Presence of permanent cardiac pacemaker 09/20/2016  . Symptomatic bradycardia   . Vitamin D deficiency 07/17/2015    Past Surgical History:  Procedure Laterality Date  . ANKLE ARTHROPLASTY    . CARDIOVERSION N/A 08/20/2016   Procedure: CARDIOVERSION;  Surgeon: Sanda Klein, MD;  Location: [facility];  Service: Cardiovascular;  Laterality: N/A;  . CATARACT EXTRACTION  2010, 2014  . CATARACT EXTRACTION  02/28/14  . CHOLECYSTECTOMY    . CHOLECYSTECTOMY N/A 05/03/2012   Procedure: LAPAROSCOPIC CHOLECYSTECTOMY WITH INTRAOPERATIVE CHOLANGIOGRAM;  Surgeon: [name], MD;  Location: [place];  Service: General;  Laterality: N/A;  . INSERT / REPLACE / REMOVE PACEMAKER  09/20/2016  . PACEMAKER IMPLANT N/A 09/20/2016   Procedure: Pacemaker Implant;  Surgeon: Deboraha Sprang, MD;  Location: [place] [facility];  Service:  Cardiovascular;  Laterality: N/A;  . ROTATOR CUFF REPAIR    . SHOULDER SURGERY    . TONSILLECTOMY      There were no vitals filed for this visit.      Subjective Assessment - 12/16/16 1019    Subjective Pt reports that things are going ok. He is having knee and low back pain currently, he thinks this is from the sitting and standing exercise.   Pertinent History chronic knee pain and low back pain; pace maker   Limitations Walking;Standing   Patient Stated Goals getting up and down better   Currently in Pain? Yes   Pain Score 5    Pain Location Knee   Pain Orientation Left;Anterior   Pain Descriptors / Indicators Aching   Pain Type Chronic pain   Pain Radiating Towards none    Pain Onset More than a month ago   Pain Frequency Intermittent   Aggravating Factors  alot of walking around and moving it too much    Pain Relieving Factors rest    Effect of Pain on Daily Activities moderate limitation               [facility] Adult PT Treatment/Exercise - 12/16/16 0001      Knee/Hip Exercises: Aerobic   Nustep L1 x4 min, increased to L2 for 4 more minutes.      Knee/Hip Exercises: Standing   Hip Extension Both;2 sets;15 reps   Extension Limitations facing countertop to  decrease compensations  1st set knee extended, 2nd set knee flexed      Knee/Hip Exercises: Seated   Long Arc Quad Both;2 sets   Long Arc Quad Weight 3 lbs.   Long [employer] Limitations x30 reps each for endurance; increased to 5# ankle weights for 2nd set    Other Seated Knee/Hip Exercises seated hip IR 3x10 reps each; hip ER with yellow TB resistance 2x10 reps each; glute squeezes x15 reps   Marching Limitations x30 sec bouts, 4x with 3# ankle weights   therapist tactile cues to decrease Rt hip ER compensation                 PT Education - 12/16/16 1056    Education provided Yes   Education Details technique with therex; encouraged pt to continue with updated HEP but omit the sit to stands to  allow his knees to recover   Person(s) Educated Patient   Methods Explanation;Verbal cues;Tactile cues   Comprehension Verbalized understanding;Need further instruction          PT Short Term Goals - 12/11/16 1047      PT SHORT TERM GOAL #1   Title independent with initial HEP   Time 4   Period Weeks   Status On-going     PT SHORT TERM GOAL #2   Title TUG < or = to 18 sec for reduced risk of falls   Time 4   Period Weeks   Status Achieved     PT SHORT TERM GOAL #3   Title able to perform 6 min walk test without and stopping   Baseline Stopped at 5 min   Time 4   Period Weeks   Status Partially Met           PT Long Term Goals - 12/11/16 1143      PT LONG TERM GOAL #1   Title independent with advanced HEP   Time 8   Period Weeks   Status On-going     PT LONG TERM GOAL #2   Title able to stand from chair without using UE due to immporved LE strength   Baseline still requires UE support   Time 8   Period Weeks   Status New     PT LONG TERM GOAL #3   Title TUG < or = to 14 sec for decreased risk of falls   Baseline 13 sec, UE use    Time 8   Period Weeks   Status Achieved     PT LONG TERM GOAL #4   Title pt will reach > or = to 1000 feet with 6 min walk test due to improved endurance   Time 8   Period Weeks   Status On-going               Plan - 12/16/16 1058    Clinical Impression Statement Pt arrived with increased Lt knee pain over the weekend, so session was adjusted and pt's pain was monitored throughout. Pt continues to demonstrate significant hip extensor weakness, and required heavy cues when completing standing hip extension exercise. Therapist made some adjustments to pt's HEP, omitting the sit to stand activity and encouraged him to continue with his other exercises rather than avoiding them completely. He verbalized understanding of this and reported no increase in  knee pain by the end of the session.    PT Treatment/Interventions  ADLs/Self Care Home Management;Biofeedback;Cryotherapy;Electrical Stimulation;Moist Heat;Ultrasound;Gait training;Stair training;Functional mobility training;Therapeutic activities;Therapeutic exercise;Balance training;Neuromuscular  re-education;Patient/family education;Manual techniques;Passive range of motion;Dry needling;Taping   PT Next Visit Plan conitnue to progress LE strength: abd/flex/ext and endurance; hip flexibility    PT Home Exercise Plan standing hamstring curls, ankle PF with blue TB , sit to stand (on hold for now)   Consulted and Agree with Plan of Care Patient      Patient will benefit from skilled therapeutic intervention in order to improve the following deficits and impairments:  Abnormal gait, Decreased activity tolerance, Decreased balance, Decreased range of motion, Decreased strength, Difficulty walking, Decreased endurance  Visit Diagnosis: Muscle weakness (generalized)  Difficulty in walking, not elsewhere classified     Problem List Patient Active Problem List   Diagnosis Date Noted  . Urinary frequency 11/06/2016  . S/P placement of cardiac pacemaker 10/24/2016  . Chronic diastolic CHF (congestive heart failure) ([place]) 10/24/2016  . Cardiac device in situ   . Atrial flutter ([place]) 08/15/2016  . Debility 08/15/2016  . Bradycardia 07/09/2016  . Persistent atrial fibrillation ([place]) 07/09/2016  . Vitamin D deficiency 07/17/2015  . Preventative health care 07/17/2015  . Lumbago 12/02/2014  . Acute subdural hematoma ([place]) 05/20/2014  . Fracture closed, nasal bone 05/19/2014  . Subdural hematoma ([place]) 05/19/2014  . Nasal fracture 05/19/2014  . Pain of toe of left foot 03/14/2014  . Tinea pedis of left foot 03/14/2014  . Diabetes mellitus type 2 in obese ([place]) 12/12/2013  . Medicare annual wellness visit, subsequent 01/17/2013  . Essential hypertension 01/13/2013  . Chronic kidney disease stage III (GFR 30-59 ml/min) 05/03/2012  . Insomnia 06/27/2011  .  Osteopenia 09/02/2010  . Family history of colon cancer 09/02/2010  . Diabetes mellitus with chronic kidney disease ([place])   . Hypertensive heart disease   . Hyperlipidemia   . Carotid artery disease ([place])   . GERD (gastroesophageal reflux disease)   . CTS (carpal tunnel syndrome) 08/28/2010    11:18 AM,12/16/16 Elly Modena PT, DPT [facility] [address], [place], [place], [address] Phone: [phone]   Fax:  [phone]  Name: ARRIS MEYN MRN: [id number] Date of Birth: 1933-06-23

## 2016-12-17 ENCOUNTER — Encounter: Payer: PPO | Admitting: Internal Medicine

## 2016-12-18 ENCOUNTER — Ambulatory Visit: Payer: PPO | Admitting: Physical Therapy

## 2016-12-18 DIAGNOSIS — M6281 Muscle weakness (generalized): Secondary | ICD-10-CM | POA: Diagnosis not present

## 2016-12-18 DIAGNOSIS — R262 Difficulty in walking, not elsewhere classified: Secondary | ICD-10-CM

## 2016-12-18 NOTE — Therapy (Signed)
[facility] [address], [place], [place], [address] Phone: [phone]   Fax:  [phone]  Physical Therapy Treatment  Patient Details  Name: Vincent Allen MRN: [id number] Date of Birth: 11-22-1933 Referring Provider: [name] A  Encounter Date: 12/18/2016      PT End of Session - 12/18/16 1030    Visit Number 12   Number of Visits 20   Date for PT Re-Evaluation 01/07/17   PT Start Time 4270   PT Stop Time 1055   PT Time Calculation (min) 40 min   Activity Tolerance Patient tolerated treatment well;No increased pain   Behavior During Therapy WFL for tasks assessed/performed      Past Medical History:  Diagnosis Date  . Arthritis   . Bradycardia   . Cataract   . Chronic kidney disease stage III (GFR 30-59 ml/min) 05/03/2012  . Colon polyps   . Complication of anesthesia    emesis  . Diabetes mellitus   . GERD (gastroesophageal reflux disease)   . HOH (hard of hearing)   . HTN (hypertension) 01/13/2013  . Hyperlipidemia   . Hypertension   . Persistent atrial fibrillation ([place])   . Presence of permanent cardiac pacemaker 09/20/2016  . Symptomatic bradycardia   . Vitamin D deficiency 07/17/2015    Past Surgical History:  Procedure Laterality Date  . ANKLE ARTHROPLASTY    . CARDIOVERSION N/A 08/20/2016   Procedure: CARDIOVERSION;  Surgeon: Sanda Klein, MD;  Location: [facility];  Service: Cardiovascular;  Laterality: N/A;  . CATARACT EXTRACTION  2010, 2014  . CATARACT EXTRACTION  02/28/14  . CHOLECYSTECTOMY    . CHOLECYSTECTOMY N/A 05/03/2012   Procedure: LAPAROSCOPIC CHOLECYSTECTOMY WITH INTRAOPERATIVE CHOLANGIOGRAM;  Surgeon: [name], MD;  Location: [place];  Service: General;  Laterality: N/A;  . INSERT / REPLACE / REMOVE PACEMAKER  09/20/2016  . PACEMAKER IMPLANT N/A 09/20/2016   Procedure: Pacemaker Implant;  Surgeon: Deboraha Sprang, MD;  Location: [facility];  Service:  Cardiovascular;  Laterality: N/A;  . ROTATOR CUFF REPAIR    . SHOULDER SURGERY    . TONSILLECTOMY      There were no vitals filed for this visit.      Subjective Assessment - 12/18/16 1019    Subjective Pt reports that he is tired today from having to get up at 6:30am. He has been up moving around more than usual, but his knees are not bothering him as much either.    Pertinent History chronic knee pain and low back pain; pace maker   Limitations Walking;Standing   Patient Stated Goals getting up and down better   Currently in Pain? No/denies   Pain Onset More than a month ago                         [facility] Adult PT Treatment/Exercise - 12/18/16 0001      Knee/Hip Exercises: Aerobic   Nustep L1, increased 1 level every minute to progress endurance, up to 5 min.      Knee/Hip Exercises: Seated   Long Arc Quad 2 sets;Both   Long Arc Quad Weight 4 lbs.   Long [employer] Limitations x20 reps each set, cues to decrease speed during eccentric lowering    Other Seated Knee/Hip Exercises seated rows 2x20 reps, red TB increased to green for 2nd set, sitting with pool noodle to encourage scapular retraction   Sit to Sand 5 reps;without  UE support;Other (comment);2 sets  holding blue weighted ball      Knee/Hip Exercises: Supine   Other Supine Knee/Hip Exercises single leg bent knee raise from full hip/knee extension x10 reps each; straight leg hip abduction 2x15 reps each (AAROM on the Rt for second set)    Other Supine Knee/Hip Exercises Bent knee fallout x10 reps each, 5 sec hold                 PT Education - 12/18/16 1031    Education provided Yes   Education Details technique with therex; noted improvements in functional strength    Person(s) Educated Patient   Methods Explanation;Verbal cues   Comprehension Verbalized understanding;Returned demonstration          PT Short Term Goals - 12/11/16 1047      PT SHORT TERM GOAL #1   Title independent  with initial HEP   Time 4   Period Weeks   Status On-going     PT SHORT TERM GOAL #2   Title TUG < or = to 18 sec for reduced risk of falls   Time 4   Period Weeks   Status Achieved     PT SHORT TERM GOAL #3   Title able to perform 6 min walk test without and stopping   Baseline Stopped at 5 min   Time 4   Period Weeks   Status Partially Met           PT Long Term Goals - 12/11/16 1143      PT LONG TERM GOAL #1   Title independent with advanced HEP   Time 8   Period Weeks   Status On-going     PT LONG TERM GOAL #2   Title able to stand from chair without using UE due to immporved LE strength   Baseline still requires UE support   Time 8   Period Weeks   Status New     PT LONG TERM GOAL #3   Title TUG < or = to 14 sec for decreased risk of falls   Baseline 13 sec, UE use    Time 8   Period Weeks   Status Achieved     PT LONG TERM GOAL #4   Title pt will reach > or = to 1000 feet with 6 min walk test due to improved endurance   Time 8   Period Weeks   Status On-going               Plan - 12/18/16 1056    Clinical Impression Statement Pt arrived with decreased knee/back irritation this session, allowing for more progressions of therex. He was able to complete all exercises with intermittent adjustments for improved comfort on the Rt. Noted improvements in his functional strength evident by his ability to complete sit to stand without an elevated seat or UE support compared to previous sessions. Ended with reports of muscle fatigue but no increases in pain. Will continue with current POC.   PT Treatment/Interventions ADLs/Self Care Home Management;Biofeedback;Cryotherapy;Electrical Stimulation;Moist Heat;Ultrasound;Gait training;Stair training;Functional mobility training;Therapeutic activities;Therapeutic exercise;Balance training;Neuromuscular re-education;Patient/family education;Manual techniques;Passive range of motion;Dry needling;Taping   PT Next  Visit Plan conitnue to progress LE strength: abd (supine)/flex/ext and endurance; hip flexibility (sidelying hip flexor stretch)   PT Home Exercise Plan standing hamstring curls, ankle PF with blue TB , sit to stand (on hold for now)   Consulted and Agree with Plan of Care Patient  Patient will benefit from skilled therapeutic intervention in order to improve the following deficits and impairments:  Abnormal gait, Decreased activity tolerance, Decreased balance, Decreased range of motion, Decreased strength, Difficulty walking, Decreased endurance  Visit Diagnosis: Muscle weakness (generalized)  Difficulty in walking, not elsewhere classified     Problem List Patient Active Problem List   Diagnosis Date Noted  . Urinary frequency 11/06/2016  . S/P placement of cardiac pacemaker 10/24/2016  . Chronic diastolic CHF (congestive heart failure) ([place]) 10/24/2016  . Cardiac device in situ   . Atrial flutter ([place]) 08/15/2016  . Debility 08/15/2016  . Bradycardia 07/09/2016  . Persistent atrial fibrillation ([place]) 07/09/2016  . Vitamin D deficiency 07/17/2015  . Preventative health care 07/17/2015  . Lumbago 12/02/2014  . Acute subdural hematoma ([place]) 05/20/2014  . Fracture closed, nasal bone 05/19/2014  . Subdural hematoma ([place]) 05/19/2014  . Nasal fracture 05/19/2014  . Pain of toe of left foot 03/14/2014  . Tinea pedis of left foot 03/14/2014  . Diabetes mellitus type 2 in obese ([place]) 12/12/2013  . Medicare annual wellness visit, subsequent 01/17/2013  . Essential hypertension 01/13/2013  . Chronic kidney disease stage III (GFR 30-59 ml/min) 05/03/2012  . Insomnia 06/27/2011  . Osteopenia 09/02/2010  . Family history of colon cancer 09/02/2010  . Diabetes mellitus with chronic kidney disease ([place])   . Hypertensive heart disease   . Hyperlipidemia   . Carotid artery disease ([place])   . GERD (gastroesophageal reflux disease)   . CTS (carpal tunnel syndrome) 08/28/2010    10:58 AM,12/18/16 Elly Modena PT, DPT [place] [facility] [address] [place], [place], [address] Phone: [phone]   Fax:  [phone]  Name: Vincent Allen MRN: [id number] Date of Birth: 10-01-1933

## 2016-12-23 ENCOUNTER — Ambulatory Visit: Payer: PPO | Admitting: Physical Therapy

## 2016-12-23 DIAGNOSIS — R262 Difficulty in walking, not elsewhere classified: Secondary | ICD-10-CM

## 2016-12-23 DIAGNOSIS — M6281 Muscle weakness (generalized): Secondary | ICD-10-CM | POA: Diagnosis not present

## 2016-12-23 NOTE — Therapy (Signed)
[facility] [address], [address] [place], [place], [address] Phone: [phone]   Fax:  [phone]  Physical Therapy Treatment  Patient Details  Name: Vincent Allen MRN: [id number] Date of Birth: May 07, 1933 Referring Provider: Danise Edge A  Encounter Date: 12/23/2016      PT End of Session - 12/23/16 1047    Visit Number 13   Number of Visits 20   Date for PT Re-Evaluation 01/07/17   PT Start Time 1015   PT Stop Time 1055   PT Time Calculation (min) 40 min   Activity Tolerance Patient tolerated treatment well;No increased pain   Behavior During Therapy WFL for tasks assessed/performed      Past Medical History:  Diagnosis Date  . Arthritis   . Bradycardia   . Cataract   . Chronic kidney disease stage III (GFR 30-59 ml/min) 05/03/2012  . Colon polyps   . Complication of anesthesia    emesis  . Diabetes mellitus   . GERD (gastroesophageal reflux disease)   . HOH (hard of hearing)   . HTN (hypertension) 01/13/2013  . Hyperlipidemia   . Hypertension   . Persistent atrial fibrillation ([facility])   . Presence of permanent cardiac pacemaker 09/20/2016  . Symptomatic bradycardia   . Vitamin D deficiency 07/17/2015    Past Surgical History:  Procedure Laterality Date  . ANKLE ARTHROPLASTY    . CARDIOVERSION N/A 08/20/2016   Procedure: CARDIOVERSION;  Surgeon: Thurmon Fair, MD;  Location: [facility];  Service: Cardiovascular;  Laterality: N/A;  . CATARACT EXTRACTION  2010, 2014  . CATARACT EXTRACTION  02/28/14  . CHOLECYSTECTOMY    . CHOLECYSTECTOMY N/A 05/03/2012   Procedure: LAPAROSCOPIC CHOLECYSTECTOMY WITH INTRAOPERATIVE CHOLANGIOGRAM;  Surgeon: Cherylynn Ridges, MD;  Location: [facility];  Service: General;  Laterality: N/A;  . INSERT / REPLACE / REMOVE PACEMAKER  09/20/2016  . PACEMAKER IMPLANT N/A 09/20/2016   Procedure: Pacemaker Implant;  Surgeon: Duke Salvia, MD;  Location: [facility];  Service:  Cardiovascular;  Laterality: N/A;  . ROTATOR CUFF REPAIR    . SHOULDER SURGERY    . TONSILLECTOMY      There were no vitals filed for this visit.      Subjective Assessment - 12/23/16 1042    Subjective Pt reports that things are going well. His knees are bothering him some because of the weather. No other complaints or concerns at this time.    Pertinent History chronic knee pain and low back pain; pace maker   Limitations Walking;Standing   Patient Stated Goals getting up and down better   Currently in Pain? No/denies   Pain Onset More than a month ago                         [facility] Adult PT Treatment/Exercise - 12/23/16 0001      Knee/Hip Exercises: Aerobic   Nustep L3 increased to L4 at 2 min, increased to L4 at 4 min and increased to L5 at 5 min, 6 min total     Knee/Hip Exercises: Seated   Long Arc Quad 20 reps;2 sets;Left   Long Arc Quad Weight 6 lbs.   Long [employer] Limitations with adductor ball squeeze    Clamshell with [employer] --  black TB 2x15 reps    Other Seated Knee/Hip Exercises seated 2x15 reps with green TB    Hamstring Curl Both;2 sets;15 reps   Hamstring Limitations double blue  TB, graded resistance to allow knee flexion through larger range   Sit to Sand 5 reps;without UE support;Other (comment)  x4 sets, rest break in between                 PT Education - 12/23/16 1047    Education provided Yes   Education Details technique with therex; discussed upcoming re-evaluation and possible discharge    Person(s) Educated Patient   Methods Explanation;Verbal cues   Comprehension Verbalized understanding;Returned demonstration          PT Short Term Goals - 12/11/16 1047      PT SHORT TERM GOAL #1   Title independent with initial HEP   Time 4   Period Weeks   Status On-going     PT SHORT TERM GOAL #2   Title TUG < or = to 18 sec for reduced risk of falls   Time 4   Period Weeks   Status Achieved     PT SHORT TERM  GOAL #3   Title able to perform 6 min walk test without and stopping   Baseline Stopped at 5 min   Time 4   Period Weeks   Status Partially Met           PT Long Term Goals - 12/11/16 1143      PT LONG TERM GOAL #1   Title independent with advanced HEP   Time 8   Period Weeks   Status On-going     PT LONG TERM GOAL #2   Title able to stand from chair without using UE due to immporved LE strength   Baseline still requires UE support   Time 8   Period Weeks   Status New     PT LONG TERM GOAL #3   Title TUG < or = to 14 sec for decreased risk of falls   Baseline 13 sec, UE use    Time 8   Period Weeks   Status Achieved     PT LONG TERM GOAL #4   Title pt will reach > or = to 1000 feet with 6 min walk test due to improved endurance   Time 8   Period Weeks   Status On-going               Plan - 12/23/16 1055    Clinical Impression Statement Continued this session with therex to promote pt's strength and endurance. He was able to complete increased sets with his sit to stands this session, reporting no increase in Lt knee pain. Pt's re-evaluation date is scheduled for his next session and we will plan for goal reassessment and discussion of progress. He verbalized understanding.   PT Treatment/Interventions ADLs/Self Care Home Management;Biofeedback;Cryotherapy;Electrical Stimulation;Moist Heat;Ultrasound;Gait training;Stair training;Functional mobility training;Therapeutic activities;Therapeutic exercise;Balance training;Neuromuscular re-education;Patient/family education;Manual techniques;Passive range of motion;Dry needling;Taping   PT Next Visit Plan reassessment    PT Home Exercise Plan standing hamstring curls, ankle PF with blue TB , sit to stand (on hold for now)   Consulted and Agree with Plan of Care Patient      Patient will benefit from skilled therapeutic intervention in order to improve the following deficits and impairments:  Abnormal gait, Decreased  activity tolerance, Decreased balance, Decreased range of motion, Decreased strength, Difficulty walking, Decreased endurance  Visit Diagnosis: Muscle weakness (generalized)  Difficulty in walking, not elsewhere classified     Problem List Patient Active Problem List   Diagnosis Date Noted  . Urinary frequency 11/06/2016  .  S/P placement of cardiac pacemaker 10/24/2016  . Chronic diastolic CHF (congestive heart failure) ([place]) 10/24/2016  . Cardiac device in situ   . Atrial flutter ([place]) 08/15/2016  . Debility 08/15/2016  . Bradycardia 07/09/2016  . Persistent atrial fibrillation ([place]) 07/09/2016  . Vitamin D deficiency 07/17/2015  . Preventative health care 07/17/2015  . Lumbago 12/02/2014  . Acute subdural hematoma ([place]) 05/20/2014  . Fracture closed, nasal bone 05/19/2014  . Subdural hematoma ([place]) 05/19/2014  . Nasal fracture 05/19/2014  . Pain of toe of left foot 03/14/2014  . Tinea pedis of left foot 03/14/2014  . Diabetes mellitus type 2 in obese ([place]) 12/12/2013  . Medicare annual wellness visit, subsequent 01/17/2013  . Essential hypertension 01/13/2013  . Chronic kidney disease stage III (GFR 30-59 ml/min) 05/03/2012  . Insomnia 06/27/2011  . Osteopenia 09/02/2010  . Family history of colon cancer 09/02/2010  . Diabetes mellitus with chronic kidney disease ([place])   . Hypertensive heart disease   . Hyperlipidemia   . Carotid artery disease ([place])   . GERD (gastroesophageal reflux disease)   . CTS (carpal tunnel syndrome) 08/28/2010    11:12 AM,12/23/16 Elly Modena PT, DPT [place] [facility] [address], [place], [place], [address] Phone: [phone]   Fax:  [phone]  Name: Vincent Allen MRN: [id number] Date of Birth: 1933/05/13

## 2016-12-24 ENCOUNTER — Ambulatory Visit: Payer: PPO | Admitting: Physical Therapy

## 2016-12-24 ENCOUNTER — Other Ambulatory Visit: Payer: Self-pay | Admitting: Family Medicine

## 2016-12-24 DIAGNOSIS — M6281 Muscle weakness (generalized): Secondary | ICD-10-CM | POA: Diagnosis not present

## 2016-12-24 DIAGNOSIS — R262 Difficulty in walking, not elsewhere classified: Secondary | ICD-10-CM

## 2016-12-24 NOTE — Therapy (Signed)
[facility] [address] [place], [place], [address] Phone: [phone]   Fax:  [phone]  Physical Therapy Treatment/Discharge  Patient Details  Name: Vincent Allen MRN: [id number] Date of Birth: 02/13/1934 Referring Provider: [name] A  Encounter Date: 12/24/2016      PT End of Session - 12/24/16 1038    Visit Number 14   Number of Visits 20   Date for PT Re-Evaluation 01/07/17   PT Start Time 1016   PT Stop Time 1059   PT Time Calculation (min) 43 min   Activity Tolerance Patient tolerated treatment well;No increased pain   Behavior During Therapy WFL for tasks assessed/performed      Past Medical History:  Diagnosis Date  . Arthritis   . Bradycardia   . Cataract   . Chronic kidney disease stage III (GFR 30-59 ml/min) 05/03/2012  . Colon polyps   . Complication of anesthesia    emesis  . Diabetes mellitus   . GERD (gastroesophageal reflux disease)   . HOH (hard of hearing)   . HTN (hypertension) 01/13/2013  . Hyperlipidemia   . Hypertension   . Persistent atrial fibrillation ([place])   . Presence of permanent cardiac pacemaker 09/20/2016  . Symptomatic bradycardia   . Vitamin D deficiency 07/17/2015    Past Surgical History:  Procedure Laterality Date  . ANKLE ARTHROPLASTY    . CARDIOVERSION N/A 08/20/2016   Procedure: CARDIOVERSION;  Surgeon: Sanda Klein, MD;  Location: [facility];  Service: Cardiovascular;  Laterality: N/A;  . CATARACT EXTRACTION  2010, 2014  . CATARACT EXTRACTION  02/28/14  . CHOLECYSTECTOMY    . CHOLECYSTECTOMY N/A 05/03/2012   Procedure: LAPAROSCOPIC CHOLECYSTECTOMY WITH INTRAOPERATIVE CHOLANGIOGRAM;  Surgeon: [name], MD;  Location: [facility];  Service: General;  Laterality: N/A;  . INSERT / REPLACE / REMOVE PACEMAKER  09/20/2016  . PACEMAKER IMPLANT N/A 09/20/2016   Procedure: Pacemaker Implant;  Surgeon: Deboraha Sprang, MD;  Location: [facility];   Service: Cardiovascular;  Laterality: N/A;  . ROTATOR CUFF REPAIR    . SHOULDER SURGERY    . TONSILLECTOMY      There were no vitals filed for this visit.      Subjective Assessment - 12/24/16 1021    Subjective Pt reports that he is a little tired this morning following his session yesterday. He has some soreness in his low back and Lt knee but nothing significant.    Pertinent History chronic knee pain and low back pain; pace maker   Limitations Walking;Standing   Patient Stated Goals getting up and down better   Currently in Pain? No/denies   Pain Onset More than a month ago            [facility] PT Assessment - 12/24/16 0001      Assessment   Medical Diagnosis R53.81 (ICD-10-CM) - Debility   Onset Date/Surgical Date --  December   Prior Therapy Not for current issue     Precautions   Precautions None     Restrictions   Weight Bearing Restrictions No     Balance Screen   Has the patient fallen in the past 6 months Yes   How many times? 1   none since starting therapy     [place] residence   Living Arrangements Alone     Prior Function   Level of [place]   Overall  Cognitive Status Within Functional Limits for tasks assessed     Posture/Postural Control   Posture/Postural Control Postural limitations   Postural Limitations Rounded Shoulders;Increased thoracic kyphosis     PROM   Overall PROM Comments hip internal rotation 50% limited     Strength   Right Hip Flexion 4+/5   Right Hip Extension 4/5   Right Hip External Rotation  5/5   Right Hip ABduction 4/5   Right Hip ADduction 4-/5   Left Hip Flexion 4+/5   Left Hip Extension 4/5   Left Hip External Rotation 5/5   Left Hip ABduction 4/5   Left Hip ADduction 4-/5   Left Ankle Dorsiflexion 4+/5     Transfers   Five time sit to stand comments  14 sec, with BUE support on armrests      Ambulation/Gait   Ambulation/Gait Yes    Ambulation/Gait Assistance 7: Independent   Ambulation Distance (Feet) 640 Feet   Gait Pattern Lateral trunk lean to right;Lateral trunk lean to left;Lateral hip instability;Decreased stride length;Decreased stance time - right;Decreased stance time - left;Decreased step length - right;Decreased step length - left;Trendelenburg  trendelenburg Lt> Rt     6 minute walk test results    Aerobic Endurance Distance Walked 760   Endurance additional comments no rest breaks taken      Timed Up and Go Test   Normal TUG (seconds) 12.6  needs UE sit <> stand                     [facility] Adult PT Treatment/Exercise - 12/24/16 0001      Knee/Hip Exercises: Aerobic   Nustep L1 x4 min prior to session      Knee/Hip Exercises: Supine   Other Supine Knee/Hip Exercises Bent knee fallout x10 reps each, 5 sec hold      Manual Therapy   Manual Therapy Passive ROM   Passive ROM B piriformis stretch, glute max stretch and hamstring stretch 5x20 sec each                PT Education - 12/24/16 1055    Education provided Yes   Education Details reviewed goals/progress since beginning PT; discussed importance of continuing with updated HEP to prevent decline in strength/mobility; discussed benefits of walking 4-5 min daily in order to continue progressing endurance   Person(s) Educated Patient   Methods Explanation   Comprehension Verbalized understanding          PT Short Term Goals - 12/24/16 1034      PT SHORT TERM GOAL #1   Title independent with initial HEP   Time 4   Period Weeks   Status Achieved     PT SHORT TERM GOAL #2   Title TUG < or = to 18 sec for reduced risk of falls   Time 4   Period Weeks   Status Achieved     PT SHORT TERM GOAL #3   Title able to perform 6 min walk test without and stopping   Time 4   Period Weeks   Status Achieved           PT Long Term Goals - 12/24/16 1035      PT LONG TERM GOAL #1   Title independent with advanced HEP    Time 8   Period Weeks   Status Achieved     PT LONG TERM GOAL #2   Title able to stand from chair without using UE due  to improved LE strength   Baseline able to complete without UE support, primarily uses UEs    Time 8   Period Weeks   Status Partially Met     PT LONG TERM GOAL #3   Title TUG < or = to 14 sec for decreased risk of falls   Baseline 13 sec, UE use    Time 8   Period Weeks   Status Achieved     PT LONG TERM GOAL #4   Title pt will reach > or = to 1000 feet with 6 min walk test due to improved endurance   Baseline 760 ft   Time 8   Period Weeks   Status Not Met               Plan - Jan 16, 2017 1109    Clinical Impression Statement Pt was discharged this session having met all but 1 of his short and long term goals. He demonstrates significant improvements in his functional strength, mobility and endurance, performing within age expected norms on the 5x sit to stand, TUG and 6 MWT. He was able to complete the 6 MWT without the need for any rest breaks, although he does demonstrate signs of LE fatigue following this. Pt does demonstrate remaining deficits in flexibility and strength, despite therapist efforts and activities completed throughout his POC. At this time, pt is agreeable with discharge pleased with his progress and understands the importance of continued HEP adherence for further improvements in strength, mobility, etc.    PT Frequency 2x / week   PT Duration 8 weeks   PT Treatment/Interventions ADLs/Self Care Home Management;Biofeedback;Cryotherapy;Electrical Stimulation;Moist Heat;Ultrasound;Gait training;Stair training;Functional mobility training;Therapeutic activities;Therapeutic exercise;Balance training;Neuromuscular re-education;Patient/family education;Manual techniques;Passive range of motion;Dry needling;Taping   PT Next Visit Plan d/c home with HEP    PT Home Exercise Plan standing hamstring curls, ankle PF with blue TB , sit to stand (on  hold for now)   Consulted and Agree with Plan of Care Patient      Patient will benefit from skilled therapeutic intervention in order to improve the following deficits and impairments:  Abnormal gait, Decreased activity tolerance, Decreased balance, Decreased range of motion, Decreased strength, Difficulty walking, Decreased endurance  Visit Diagnosis: Muscle weakness (generalized)  Difficulty in walking, not elsewhere classified       G-Codes - 2017-01-16 1028    Functional Assessment Tool Used (Outpatient Only) TUG, 6MWT, clinical impression   Functional Limitation Mobility: Walking and moving around   Mobility: Walking and Moving Around Goal Status [phone]) At least 60 percent but less than 80 percent impaired, limited or restricted   Mobility: Walking and Moving Around Discharge Status [phone]) At least 60 percent but less than 80 percent impaired, limited or restricted      Problem List Patient Active Problem List   Diagnosis Date Noted  . Urinary frequency 11/06/2016  . S/P placement of cardiac pacemaker 10/24/2016  . Chronic diastolic CHF (congestive heart failure) ([place]) 10/24/2016  . Cardiac device in situ   . Atrial flutter ([place]) 08/15/2016  . Debility 08/15/2016  . Bradycardia 07/09/2016  . Persistent atrial fibrillation ([place]) 07/09/2016  . Vitamin D deficiency 07/17/2015  . Preventative health care 07/17/2015  . Lumbago 12/02/2014  . Acute subdural hematoma ([place]) 05/20/2014  . Fracture closed, nasal bone 05/19/2014  . Subdural hematoma ([place]) 05/19/2014  . Nasal fracture 05/19/2014  . Pain of toe of left foot 03/14/2014  . Tinea pedis of left foot 03/14/2014  . Diabetes mellitus  type 2 in obese ([place]) 12/12/2013  . Medicare annual wellness visit, subsequent 01/17/2013  . Essential hypertension 01/13/2013  . Chronic kidney disease stage III (GFR 30-59 ml/min) 05/03/2012  . Insomnia 06/27/2011  . Osteopenia 09/02/2010  . Family history of colon cancer 09/02/2010   . Diabetes mellitus with chronic kidney disease ([place])   . Hypertensive heart disease   . Hyperlipidemia   . Carotid artery disease ([place])   . GERD (gastroesophageal reflux disease)   . CTS (carpal tunnel syndrome) 08/28/2010      PHYSICAL THERAPY DISCHARGE SUMMARY  Visits from Start of Care: 14  Current functional level related to goals / functional outcomes: See above for more details    Remaining deficits: See above for more details    Education / Equipment: See above for more details  Plan: Patient agrees to discharge.  Patient goals were met. Patient is being discharged due to being pleased with the current functional level.  ?????      1:41 PM,12/24/16 Elly Modena PT, [facility] [address] [place], [place], [address] Phone: [phone]   Fax:  [phone]  Name: Vincent Allen MRN: [id number] Date of Birth: 09-Sep-1933

## 2016-12-27 ENCOUNTER — Other Ambulatory Visit (INDEPENDENT_AMBULATORY_CARE_PROVIDER_SITE_OTHER): Payer: PPO

## 2016-12-27 DIAGNOSIS — I10 Essential (primary) hypertension: Secondary | ICD-10-CM | POA: Diagnosis not present

## 2016-12-27 LAB — COMPREHENSIVE METABOLIC PANEL
ALK PHOS: 41 U/L (ref 39–117)
ALT: 14 U/L (ref 0–53)
AST: 13 U/L (ref 0–37)
Albumin: 3.7 g/dL (ref 3.5–5.2)
BUN: 24 mg/dL — ABNORMAL HIGH (ref 6–23)
CO2: 25 mEq/L (ref 19–32)
Calcium: 9.1 mg/dL (ref 8.4–10.5)
Chloride: 106 mEq/L (ref 96–112)
Creatinine, Ser: 1.46 mg/dL (ref 0.40–1.50)
GFR: 49.2 mL/min — AB (ref >60.00)
GLUCOSE: 116 mg/dL — AB (ref 70–99)
Potassium: 4 mEq/L (ref 3.5–5.1)
Sodium: 140 mEq/L (ref 135–145)
TOTAL PROTEIN: 6.7 g/dL (ref 6.0–8.3)
Total Bilirubin: 0.9 mg/dL (ref 0.2–1.2)

## 2017-01-01 ENCOUNTER — Encounter: Payer: Self-pay | Admitting: Internal Medicine

## 2017-01-01 ENCOUNTER — Ambulatory Visit (INDEPENDENT_AMBULATORY_CARE_PROVIDER_SITE_OTHER): Payer: PPO | Admitting: Internal Medicine

## 2017-01-01 VITALS — BP 136/70 | HR 71 | Resp 16 | Ht 72.0 in | Wt 223.0 lb

## 2017-01-01 DIAGNOSIS — I4819 Other persistent atrial fibrillation: Secondary | ICD-10-CM

## 2017-01-01 DIAGNOSIS — I495 Sick sinus syndrome: Secondary | ICD-10-CM

## 2017-01-01 DIAGNOSIS — I481 Persistent atrial fibrillation: Secondary | ICD-10-CM | POA: Diagnosis not present

## 2017-01-01 DIAGNOSIS — Z45018 Encounter for adjustment and management of other part of cardiac pacemaker: Secondary | ICD-10-CM

## 2017-01-01 DIAGNOSIS — R001 Bradycardia, unspecified: Secondary | ICD-10-CM | POA: Diagnosis not present

## 2017-01-01 NOTE — Patient Instructions (Signed)
Medication Instructions:  Your physician recommends that you continue on your current medications as directed. Please refer to the Current Medication list given to you today.  -- If you need a refill on your cardiac medications before your next appointment, please call your pharmacy. --  Labwork: None ordered  Testing/Procedures: None ordered  Follow-Up: Your physician wants you to follow-up in: 9 months with Tommye Standard, PA  You will receive a reminder letter in the mail two months in advance. If you don't receive a letter, please call our office to schedule the follow-up appointment.  Remote monitoring is used to monitor your Pacemaker  from home. This monitoring reduces the number of office visits required to check your device to one time per year. It allows [place] to keep an eye on the functioning of your device to ensure it is working properly. You are scheduled for a device check from home on 04/02/2017. You may send your transmission at any time that day. If you have a wireless device, the transmission will be sent automatically. After your physician reviews your transmission, you will receive a postcard with your next transmission date.    Thank you for choosing [facility]!!   Frederik Schmidt, RN [phone]  Any Other Special Instructions Will Be Listed Below (If Applicable).

## 2017-01-01 NOTE — Progress Notes (Signed)
Patient Care Team: [name], MD as PCP - General (Family Medicine) Clent Jacks, MD as Consulting Physician (Ophthalmology) Danella Sensing, MD as Consulting Physician (Dermatology) Dorothy Spark, MD as Consulting Physician (Cardiology) [name], MD as Consulting Physician (Orthopedic Surgery) Paulla Dolly Tamala Fothergill, DPM as Consulting Physician (Podiatry)   HPI  Vincent Allen is a 83 y.o. male Seen in follow-up for bradycardia with atrial fibrillation. He underwent pacing 7/18. The hope had been that improving his heart rate would attenuate symptoms following pacing and we could leave him in atrial fibrillation. Exercise tolerance is significantly improved following pacing. Heart rate excursion is good.  He also has a history of HFpEF; he is currently without edema. He continues with stable nocturia.      Thromboembolic risk factors ( age - -2, HTN-1, DM-1, Vasc disease -1) for a CHADSVASc Score of 5;  He is on reduced dose apixoban   Past Medical History:  Diagnosis Date  . Arthritis   . Bradycardia   . Cataract   . Chronic kidney disease stage III (GFR 30-59 ml/min) 05/03/2012  . Colon polyps   . Complication of anesthesia    emesis  . Diabetes mellitus   . GERD (gastroesophageal reflux disease)   . HOH (hard of hearing)   . HTN (hypertension) 01/13/2013  . Hyperlipidemia   . Hypertension   . Persistent atrial fibrillation ([place])   . Presence of permanent cardiac pacemaker 09/20/2016  . Symptomatic bradycardia   . Vitamin D deficiency 07/17/2015    Past Surgical History:  Procedure Laterality Date  . ANKLE ARTHROPLASTY    . CARDIOVERSION N/A 08/20/2016   Procedure: CARDIOVERSION;  Surgeon: Sanda Klein, MD;  Location: [facility];  Service: Cardiovascular;  Laterality: N/A;  . CATARACT EXTRACTION  2010, 2014  . CATARACT EXTRACTION  02/28/14  . CHOLECYSTECTOMY    . CHOLECYSTECTOMY N/A 05/03/2012   Procedure: LAPAROSCOPIC CHOLECYSTECTOMY WITH  INTRAOPERATIVE CHOLANGIOGRAM;  Surgeon: [name], MD;  Location: [facility];  Service: General;  Laterality: N/A;  . INSERT / REPLACE / REMOVE PACEMAKER  09/20/2016  . PACEMAKER IMPLANT N/A 09/20/2016   Procedure: Pacemaker Implant;  Surgeon: Deboraha Sprang, MD;  Location: [facility];  Service: Cardiovascular;  Laterality: N/A;  . ROTATOR CUFF REPAIR    . SHOULDER SURGERY    . TONSILLECTOMY      Current Outpatient Prescriptions  Medication Sig Dispense Refill  . amLODipine (NORVASC) 5 MG tablet TAKE 1 TABLET BY MOUTH EVERY DAY 90 tablet 1  . atorvastatin (LIPITOR) 20 MG tablet TAKE 1 TABLET BY MOUTH DAILY 90 tablet 3  . Calcium 600 MG tablet Take 1 tablet (600 mg total) by mouth 2 (two) times daily. 60 tablet 0  . clotrimazole (LOTRIMIN) 1 % cream Apply to affected area 2 times daily 15 g 0  . ELIQUIS 2.5 MG TABS tablet TAKE 1 TABLET (2.5 MG TOTAL) BY MOUTH 2 (TWO) TIMES DAILY. 60 tablet 2  . furosemide (LASIX) 20 MG tablet Take 1 tablet (20 mg total) by mouth daily as needed for edema (weight gain >3lbs in 24 hrs, SOB). 30 tablet 2  . glipiZIDE (GLUCOTROL) 10 MG tablet TAKE 2 TABLETS BY MOUTH TWICE DAILY BEFORE MEALS 360 tablet 0  . glucose blood (ONE TOUCH ULTRA TEST) test strip Use as twice daily to check blood sugar.  DX E11.9 100 each 6  . JANUVIA 100 MG tablet TAKE 1/2 TO 1 TABLET DAILY 30  tablet 5  . Lancets (ONETOUCH ULTRASOFT) lancets Use as instructed to check blood sugar twice a day as needed Dx Code E11.9 100 each 6  . losartan (COZAAR) 50 MG tablet Take 1 tablet (50 mg total) by mouth daily. 90 tablet 3  . metFORMIN (GLUCOPHAGE-XR) 500 MG 24 hr tablet TAKE 1 TABLET (500 MG TOTAL) BY MOUTH 2 (TWO) TIMES DAILY. 180 tablet 1  . triamterene-hydrochlorothiazide (MAXZIDE) 75-50 MG tablet TAKE 1 TABLET BY MOUTH DAILY. 90 tablet 1   No current facility-administered medications for this visit.     Allergies  Allergen Reactions  . Penicillins Hives and Itching    Has patient  had a PCN reaction causing immediate rash, facial/tongue/throat swelling, SOB or lightheadedness with hypotension:Yes Has patient had a PCN reaction causing severe rash involving mucus membranes or skin necrosis:No Has patient had a PCN reaction that required hospitalization:No Has patient had a PCN reaction occurring within the last 10 years:No If all of the above answers are "NO", then may proceed with Cephalosporin use.   . Verapamil     Junctional rhythm  . Influenza Vaccines Rash      Review of Systems negative except from HPI and PMH  Physical Exam BP 136/70   Pulse 71   Resp 16   Ht 6' (1.829 m)   Wt 223 lb (101.2 kg)   SpO2 97%   BMI 30.24 kg/m  Well developed and nourished in no acute distress HENT normal Neck supple with JVP-flat Carotids brisk  Device pocket well healed; without hematoma or erythema.  There is no tethering  Clear Regular rate and rhythm, no murmurs or gallops Abd-soft with active BS without hepatomegaly No Clubbing cyanosis edema Skin-warm and dry A & Oriented  Grossly normal sensory and motor function  ECG afib   Vpacing 66 -/20/49   Assessment and  Plan  Atrial fibrillation-persistent slow ventricular response  Dyspnea on exertion   First-degree AV block  Congestive heart failure diastolic chronic  Pacemaker    Much improved with pacing   100%  With improved chronotropic competence  Will leave him in AFib   On Anticoagulation;  No bleeding issues   Euvolemic continue current meds      Current medicines are reviewed at length with the patient today .  The patient does not  have concerns regarding medicines.

## 2017-01-14 ENCOUNTER — Other Ambulatory Visit: Payer: Self-pay | Admitting: Family Medicine

## 2017-01-16 LAB — CUP PACEART INCLINIC DEVICE CHECK
Battery Remaining Longevity: 155 mo
Brady Statistic AS VS Percent: 0 %
Date Time Interrogation Session: 20181017175916
Implantable Lead Implant Date: 20180706
Implantable Lead Location: 753859
Lead Channel Impedance Value: 551 Ohm
Lead Channel Impedance Value: 646 Ohm
Lead Channel Pacing Threshold Amplitude: 0.5 V
Lead Channel Setting Pacing Amplitude: 2.5 V
Lead Channel Setting Pacing Amplitude: 3.5 V
Lead Channel Setting Sensing Sensitivity: 2.8 mV
MDC IDC LEAD IMPLANT DT: 20180706
MDC IDC LEAD LOCATION: 753860
MDC IDC MSMT BATTERY VOLTAGE: 3.16 V
MDC IDC MSMT LEADCHNL RA IMPEDANCE VALUE: 323 Ohm
MDC IDC MSMT LEADCHNL RA SENSING INTR AMPL: 1.5 mV
MDC IDC MSMT LEADCHNL RV IMPEDANCE VALUE: 855 Ohm
MDC IDC MSMT LEADCHNL RV PACING THRESHOLD PULSEWIDTH: 0.4 ms
MDC IDC MSMT LEADCHNL RV SENSING INTR AMPL: 11.25 mV
MDC IDC PG IMPLANT DT: 20180706
MDC IDC SET LEADCHNL RV PACING PULSEWIDTH: 0.4 ms
MDC IDC STAT BRADY AP VP PERCENT: 0 %
MDC IDC STAT BRADY AP VS PERCENT: 0 %
MDC IDC STAT BRADY AS VP PERCENT: 0 %
MDC IDC STAT BRADY RA PERCENT PACED: 0.16 %
MDC IDC STAT BRADY RV PERCENT PACED: 97.86 %

## 2017-01-19 ENCOUNTER — Other Ambulatory Visit: Payer: Self-pay | Admitting: Family Medicine

## 2017-01-24 ENCOUNTER — Telehealth: Payer: Self-pay | Admitting: Family Medicine

## 2017-01-24 NOTE — Telephone Encounter (Signed)
Pt's son Shanon Brow called in because he said that pt received a denial / refusal to pay notice from insurance because they said that the incorrect code was used on DOS 12/05/16 (BP Check)   He would like to have further assist.    CB: [phone] - (Pt) OR [phone] (son, Shanon Brow)

## 2017-01-28 ENCOUNTER — Encounter: Payer: Self-pay | Admitting: Family Medicine

## 2017-01-29 ENCOUNTER — Other Ambulatory Visit (INDEPENDENT_AMBULATORY_CARE_PROVIDER_SITE_OTHER): Payer: PPO

## 2017-01-29 DIAGNOSIS — E119 Type 2 diabetes mellitus without complications: Secondary | ICD-10-CM | POA: Diagnosis not present

## 2017-01-29 DIAGNOSIS — E785 Hyperlipidemia, unspecified: Secondary | ICD-10-CM

## 2017-01-29 DIAGNOSIS — I10 Essential (primary) hypertension: Secondary | ICD-10-CM | POA: Diagnosis not present

## 2017-01-29 DIAGNOSIS — E1169 Type 2 diabetes mellitus with other specified complication: Secondary | ICD-10-CM | POA: Diagnosis not present

## 2017-01-29 LAB — CBC WITH DIFFERENTIAL/PLATELET
Basophils Absolute: 0.1 10*3/uL (ref 0.0–0.1)
Basophils Relative: 0.6 % (ref 0.0–3.0)
EOS PCT: 2.1 % (ref 0.0–5.0)
Eosinophils Absolute: 0.2 10*3/uL (ref 0.0–0.7)
HEMATOCRIT: 45.6 % (ref 39.0–52.0)
HEMOGLOBIN: 14.6 g/dL (ref 13.0–17.0)
LYMPHS PCT: 24.1 % (ref 12.0–46.0)
Lymphs Abs: 1.9 10*3/uL (ref 0.7–4.0)
MCHC: 32 g/dL (ref 30.0–36.0)
MCV: 85.2 fl (ref 78.0–100.0)
MONO ABS: 0.7 10*3/uL (ref 0.1–1.0)
MONOS PCT: 8.4 % (ref 3.0–12.0)
Neutro Abs: 5.1 10*3/uL (ref 1.4–7.7)
Neutrophils Relative %: 64.8 % (ref 43.0–77.0)
Platelets: 180 10*3/uL (ref 150.0–400.0)
RBC: 5.35 Mil/uL (ref 4.22–5.81)
RDW: 13.9 % (ref 11.5–15.5)
WBC: 8 10*3/uL (ref 4.0–10.5)

## 2017-01-29 LAB — COMPREHENSIVE METABOLIC PANEL
ALBUMIN: 3.7 g/dL (ref 3.5–5.2)
ALK PHOS: 48 U/L (ref 39–117)
ALT: 14 U/L (ref 0–53)
AST: 15 U/L (ref 0–37)
BILIRUBIN TOTAL: 0.7 mg/dL (ref 0.2–1.2)
BUN: 28 mg/dL — ABNORMAL HIGH (ref 6–23)
CALCIUM: 9.5 mg/dL (ref 8.4–10.5)
CO2: 25 mEq/L (ref 19–32)
Chloride: 104 mEq/L (ref 96–112)
Creatinine, Ser: 1.45 mg/dL (ref 0.40–1.50)
GFR: 49.58 mL/min — AB (ref >60.00)
GLUCOSE: 116 mg/dL — AB (ref 70–99)
Potassium: 3.8 mEq/L (ref 3.5–5.1)
Sodium: 139 mEq/L (ref 135–145)
TOTAL PROTEIN: 6.9 g/dL (ref 6.0–8.3)

## 2017-01-29 LAB — LIPID PANEL
CHOLESTEROL: 104 mg/dL (ref 0–200)
HDL: 37.3 mg/dL — AB (ref >39.00)
LDL Cholesterol: 51 mg/dL (ref 0–99)
NonHDL: 66.62
Total CHOL/HDL Ratio: 3
Triglycerides: 80 mg/dL (ref 0.0–149.0)
VLDL: 16 mg/dL (ref 0.0–40.0)

## 2017-01-29 LAB — TSH: TSH: 3.79 u[IU]/mL (ref 0.35–4.50)

## 2017-01-29 LAB — HEMOGLOBIN A1C: HEMOGLOBIN A1C: 7.2 % — AB (ref 4.6–6.5)

## 2017-01-29 NOTE — Addendum Note (Signed)
Addended by: Harl Bowie on: 01/29/2017 12:25 PM   Modules accepted: Orders

## 2017-01-30 NOTE — Telephone Encounter (Signed)
Sent message to coders to review and see if we billed correctly

## 2017-01-30 NOTE — Telephone Encounter (Signed)
Coding response-The nurse visit is being denied as bundled into the venipuncture. I will send to charge corrections to request them to void the venipuncture and send corrected claim.   LM for son Shanon Brow informing him of the changes

## 2017-02-03 ENCOUNTER — Ambulatory Visit: Payer: PPO | Admitting: Family Medicine

## 2017-02-03 ENCOUNTER — Encounter: Payer: Self-pay | Admitting: Family Medicine

## 2017-02-03 VITALS — BP 120/68 | HR 84 | Temp 97.7°F | Resp 18 | Wt 225.0 lb

## 2017-02-03 DIAGNOSIS — I5032 Chronic diastolic (congestive) heart failure: Secondary | ICD-10-CM | POA: Diagnosis not present

## 2017-02-03 DIAGNOSIS — Z23 Encounter for immunization: Secondary | ICD-10-CM

## 2017-02-03 DIAGNOSIS — I4819 Other persistent atrial fibrillation: Secondary | ICD-10-CM

## 2017-02-03 DIAGNOSIS — I10 Essential (primary) hypertension: Secondary | ICD-10-CM | POA: Diagnosis not present

## 2017-02-03 DIAGNOSIS — E1169 Type 2 diabetes mellitus with other specified complication: Secondary | ICD-10-CM

## 2017-02-03 DIAGNOSIS — E785 Hyperlipidemia, unspecified: Secondary | ICD-10-CM | POA: Diagnosis not present

## 2017-02-03 DIAGNOSIS — E669 Obesity, unspecified: Secondary | ICD-10-CM | POA: Diagnosis not present

## 2017-02-03 DIAGNOSIS — I481 Persistent atrial fibrillation: Secondary | ICD-10-CM | POA: Diagnosis not present

## 2017-02-03 DIAGNOSIS — E559 Vitamin D deficiency, unspecified: Secondary | ICD-10-CM

## 2017-02-03 MED ORDER — TRIAMTERENE-HCTZ 37.5-25 MG PO TABS: 1.0000 | ORAL_TABLET | Freq: Every day | ORAL | 1 refills | Status: DC

## 2017-02-03 NOTE — Progress Notes (Signed)
Subjective:  I acted as a Education administrator for Dr. [name]. [place], [place]  Patient ID: Vincent Allen, male    DOB: 03/09/1934, 83 y.o.   MRN: [id number]  No chief complaint on file.   HPI  Patient is in today for a 3 month follow up and overall he is doing well. No recent febrile illness or hospitalizations. He struggles with pain in his lower extremities at night which results in trouble with falling asleep and then he gets up frequently at night to urinate and sometimes struggles to go back to sleep. Denies CP/palp/SOB/HA/congestion/fevers/GI or GU c/o. Taking meds as prescribed  Patient Care Team: [name], MD as PCP - General (Family Medicine) Clent Jacks, MD as Consulting Physician (Ophthalmology) Danella Sensing, MD as Consulting Physician (Dermatology) Dorothy Spark, MD as Consulting Physician (Cardiology) [name], MD as Consulting Physician (Orthopedic Surgery) Paulla Dolly Tamala Fothergill, DPM as Consulting Physician (Podiatry)   Past Medical History:  Diagnosis Date  . Arthritis   . Bradycardia   . Cataract   . Chronic kidney disease stage III (GFR 30-59 ml/min) 05/03/2012  . Colon polyps   . Complication of anesthesia    emesis  . Diabetes mellitus   . GERD (gastroesophageal reflux disease)   . HOH (hard of hearing)   . HTN (hypertension) 01/13/2013  . Hyperlipidemia   . Hypertension   . Persistent atrial fibrillation ([place])   . Presence of permanent cardiac pacemaker 09/20/2016  . Symptomatic bradycardia   . Vitamin D deficiency 07/17/2015    Past Surgical History:  Procedure Laterality Date  . ANKLE ARTHROPLASTY    . CARDIOVERSION N/A 08/20/2016   Performed by Sanda Klein, MD at [facility]  . CATARACT EXTRACTION  2010, 2014  . CATARACT EXTRACTION  02/28/14  . CHOLECYSTECTOMY    . INSERT / REPLACE / REMOVE PACEMAKER  09/20/2016  . LAPAROSCOPIC CHOLECYSTECTOMY WITH INTRAOPERATIVE CHOLANGIOGRAM N/A 05/03/2012   Performed by [name], MD at [facility]    . Pacemaker Implant N/A 09/20/2016   Performed by Deboraha Sprang, MD at [facility]  . ROTATOR CUFF REPAIR    . SHOULDER SURGERY    . TONSILLECTOMY      Family History  Problem Relation Age of Onset  . Cancer Mother        colon, breast, pancreas, skin cancer  . Heart disease Father        heart valve replaced  . Stroke Father   . Diabetes Father   . Cancer Paternal Grandmother        colon  . Obesity Son   . Heart disease Son        bradycardia    Social History   Socioeconomic History  . Marital status: Widowed    Spouse name: Not on file  . Number of children: Not on file  . Years of education: Not on file  . Highest education level: Not on file  Social Needs  . Financial resource strain: Not on file  . Food insecurity - worry: Not on file  . Food insecurity - inability: Not on file  . Transportation needs - medical: Not on file  . Transportation needs - non-medical: Not on file  Occupational History  . Not on file  Tobacco Use  . Smoking status: Never Smoker  . Smokeless tobacco: Never Used  Substance and Sexual Activity  . Alcohol use: Yes    Alcohol/week: 0.6 oz    Types:  1 Glasses of wine per week  . Drug use: No  . 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.  Other Topics Concern  . Not on file  Social History Narrative  . Not on file    Outpatient Medications Prior to Visit  Medication Sig Dispense Refill  . amLODipine (NORVASC) 5 MG tablet TAKE 1 TABLET BY MOUTH EVERY DAY 90 tablet 1  . atorvastatin (LIPITOR) 20 MG tablet TAKE 1 TABLET BY MOUTH DAILY 90 tablet 3  . Calcium 600 MG tablet Take 1 tablet (600 mg total) by mouth 2 (two) times daily. 60 tablet 0  . ELIQUIS 2.5 MG TABS tablet TAKE 1 TABLET (2.5 MG TOTAL) BY MOUTH 2 (TWO) TIMES DAILY. 60 tablet 2  . furosemide (LASIX) 20 MG tablet Take 1 tablet (20 mg total) by mouth daily as  needed for edema (weight gain >3lbs in 24 hrs, SOB). 30 tablet 2  . glipiZIDE (GLUCOTROL) 10 MG tablet TAKE 2 TABLETS BY MOUTH TWICE DAILY BEFORE MEALS 360 tablet 0  . glucose blood (ONE TOUCH ULTRA TEST) test strip Use as twice daily to check blood sugar.  DX E11.9 100 each 6  . JANUVIA 100 MG tablet TAKE 1/2 TO 1 TABLET DAILY 30 tablet 5  . Lancets (ONETOUCH ULTRASOFT) lancets Use as instructed to check blood sugar twice a day as needed Dx Code E11.9 100 each 6  . losartan (COZAAR) 50 MG tablet Take 1 tablet (50 mg total) by mouth daily. 90 tablet 3  . metFORMIN (GLUCOPHAGE-XR) 500 MG 24 hr tablet TAKE 1 TABLET (500 MG TOTAL) BY MOUTH 2 (TWO) TIMES DAILY. 180 tablet 1  . clotrimazole (LOTRIMIN) 1 % cream Apply to affected area 2 times daily 15 g 0  . triamterene-hydrochlorothiazide (MAXZIDE) 75-50 MG tablet TAKE 1 TABLET BY MOUTH DAILY. 90 tablet 1   No facility-administered medications prior to visit.     Allergies  Allergen Reactions  . Penicillins Hives and Itching    Has patient had a PCN reaction causing immediate rash, facial/tongue/throat swelling, SOB or lightheadedness with hypotension:Yes Has patient had a PCN reaction causing severe rash involving mucus membranes or skin necrosis:No Has patient had a PCN reaction that required hospitalization:No Has patient had a PCN reaction occurring within the last 10 years:No If all of the above answers are "NO", then may proceed with Cephalosporin use.   . Verapamil     Junctional rhythm  . Influenza Vaccines Rash    Review of Systems  Constitutional: Positive for malaise/fatigue. Negative for fever.  HENT: Negative for congestion.   Eyes: Negative for blurred vision.  Respiratory: Negative for cough and shortness of breath.   Cardiovascular: Negative for chest pain, palpitations and leg swelling.  Gastrointestinal: Negative for vomiting.  Genitourinary: Positive for frequency. Negative for dysuria and hematuria.  Musculoskeletal:  Positive for back pain, joint pain and myalgias.  Skin: Negative for rash.  Neurological: Negative for loss of consciousness and headaches.  Psychiatric/Behavioral: Negative for depression and memory loss. The patient has insomnia. The patient is not nervous/anxious.        Objective:    Physical Exam  Constitutional: He is oriented to person, place, and time. He appears well-developed and well-nourished. No distress.  HENT:  Head: Normocephalic and atraumatic.  Eyes: Conjunctivae are normal.  Neck: Normal range of motion. No thyromegaly present.  Cardiovascular: Normal rate and  regular rhythm.  Pulmonary/Chest: Effort normal and breath sounds normal. He has no wheezes.  Abdominal: Soft. Bowel sounds are normal. There is no tenderness.  Musculoskeletal: Normal range of motion. He exhibits no edema or deformity.  Neurological: He is alert and oriented to person, place, and time.  Skin: Skin is warm and dry. He is not diaphoretic.  Psychiatric: He has a normal mood and affect.    BP 120/68 (BP Location: Left Arm, Patient Position: Sitting, Cuff Size: Normal)   Pulse 84   Temp 97.7 F (36.5 C) (Oral)   Resp 18   Wt 225 lb (102.1 kg)   SpO2 97%   BMI 30.52 kg/m  Wt Readings from Last 3 Encounters:  02/03/17 225 lb (102.1 kg)  01/01/17 223 lb (101.2 kg)  11/11/16 225 lb (102.1 kg)   BP Readings from Last 3 Encounters:  02/03/17 120/68  01/01/17 136/70  12/05/16 126/73     Immunization History  Administered Date(s) Administered  . Pneumococcal Conjugate-13 03/23/2015  . Pneumococcal Polysaccharide-23 06/27/2011  . Tdap 05/19/2014    Health Maintenance  Topic Date Due  . FOOT EXAM  07/16/2016  . INFLUENZA VACCINE  03/10/2017 (Originally 10/16/2016)  . OPHTHALMOLOGY EXAM  05/22/2017  . URINE MICROALBUMIN  07/15/2017  . HEMOGLOBIN A1C  07/29/2017  . TETANUS/TDAP  05/18/2024  . PNA vac Low Risk Adult  Completed    Lab Results  Component Value Date   WBC 8.0  01/29/2017   HGB 14.6 01/29/2017   HCT 45.6 01/29/2017   PLT 180.0 01/29/2017   GLUCOSE 116 (H) 01/29/2017   CHOL 104 01/29/2017   TRIG 80.0 01/29/2017   HDL 37.30 (L) 01/29/2017   LDLDIRECT 70.8 03/14/2014   LDLCALC 51 01/29/2017   ALT 14 01/29/2017   AST 15 01/29/2017   NA 139 01/29/2017   K 3.8 01/29/2017   CL 104 01/29/2017   CREATININE 1.45 01/29/2017   BUN 28 (H) 01/29/2017   CO2 25 01/29/2017   TSH 3.79 01/29/2017   PSA 1.18 10/07/2012   INR 1.0 08/15/2016   HGBA1C 7.2 (H) 01/29/2017   MICROALBUR 26.7 (H) 07/15/2016    Lab Results  Component Value Date   TSH 3.79 01/29/2017   Lab Results  Component Value Date   WBC 8.0 01/29/2017   HGB 14.6 01/29/2017   HCT 45.6 01/29/2017   MCV 85.2 01/29/2017   PLT 180.0 01/29/2017   Lab Results  Component Value Date   NA 139 01/29/2017   K 3.8 01/29/2017   CO2 25 01/29/2017   GLUCOSE 116 (H) 01/29/2017   BUN 28 (H) 01/29/2017   CREATININE 1.45 01/29/2017   BILITOT 0.7 01/29/2017   ALKPHOS 48 01/29/2017   AST 15 01/29/2017   ALT 14 01/29/2017   PROT 6.9 01/29/2017   ALBUMIN 3.7 01/29/2017   CALCIUM 9.5 01/29/2017   ANIONGAP 8 07/10/2016   GFR 49.58 (L) 01/29/2017   Lab Results  Component Value Date   CHOL 104 01/29/2017   Lab Results  Component Value Date   HDL 37.30 (L) 01/29/2017   Lab Results  Component Value Date   LDLCALC 51 01/29/2017   Lab Results  Component Value Date   TRIG 80.0 01/29/2017   Lab Results  Component Value Date   CHOLHDL 3 01/29/2017   Lab Results  Component Value Date   HGBA1C 7.2 (H) 01/29/2017         Assessment & Plan:   Problem List Items Addressed This Visit  Hyperlipidemia (Chronic)    Tolerating statin, encouraged heart healthy diet, avoid trans fats, minimize simple carbs and saturated fats. Increase exercise as tolerated      Relevant Medications   triamterene-hydrochlorothiazide (MAXZIDE-25) 37.5-25 MG tablet   Other Relevant Orders   Lipid panel     Essential hypertension    Well controlled, no changes to meds. Encouraged heart healthy diet such as the DASH diet and exercise as tolerated.       Relevant Medications   triamterene-hydrochlorothiazide (MAXZIDE-25) 37.5-25 MG tablet   Other Relevant Orders   Varicella zoster antibody, IgG   CBC   Comprehensive metabolic panel   TSH   Diabetes mellitus type 2 in obese (HCC)    hgba1c acceptable, minimize simple carbs. Increase exercise as tolerated. Continue current meds      Relevant Orders   Varicella zoster antibody, IgG   Hemoglobin A1c   Vitamin D deficiency    Take a daily supplement      Relevant Orders   VITAMIN D 25 Hydroxy (Vit-D Deficiency, Fractures)   Persistent atrial fibrillation (HCC)    Tolerating Eliquis and pacer working well. No recent concerning sympotms      Relevant Medications   triamterene-hydrochlorothiazide (MAXZIDE-25) 37.5-25 MG tablet   Chronic diastolic CHF (congestive heart failure) (HCC)    No recent exacerbation.      Relevant Medications   triamterene-hydrochlorothiazide (MAXZIDE-25) 37.5-25 MG tablet    Other Visit Diagnoses    Need for viral immunization    -  Primary   Relevant Orders   Varicella zoster antibody, IgG      I have discontinued Adriann C. Peedin "Bob"'s clotrimazole and triamterene-hydrochlorothiazide. I am also having him start on triamterene-hydrochlorothiazide. Additionally, I am having him maintain his calcium carbonate, glucose blood, onetouch ultrasoft, atorvastatin, losartan, furosemide, ELIQUIS, glipiZIDE, JANUVIA, amLODipine, and metFORMIN.  Meds ordered this encounter  Medications  . triamterene-hydrochlorothiazide (MAXZIDE-25) 37.5-25 MG tablet    Sig: Take 1 tablet daily by mouth.    Dispense:  90 tablet    Refill:  1    CMA served as Education administrator during this visit. History, Physical and Plan performed by medical provider. Documentation and orders reviewed and attested to.  [name], MD

## 2017-02-03 NOTE — Assessment & Plan Note (Signed)
No recent exacerbation 

## 2017-02-03 NOTE — Assessment & Plan Note (Signed)
Tolerating statin, encouraged heart healthy diet, avoid trans fats, minimize simple carbs and saturated fats. Increase exercise as tolerated 

## 2017-02-03 NOTE — Assessment & Plan Note (Signed)
Well controlled, no changes to meds. Encouraged heart healthy diet such as the DASH diet and exercise as tolerated.  °

## 2017-02-03 NOTE — Assessment & Plan Note (Signed)
Take a daily supplement 

## 2017-02-03 NOTE — Patient Instructions (Addendum)
Consider Tylenol/Acetaminophen ES  500 mg, 1 tab twice daily if not enough pain control increase to 2 tabs twice daily  Shingrix is the new shingles shot, 2 shots over 2-6 months at pharmacy Hypertension Hypertension is another name for high blood pressure. High blood pressure forces your heart to work harder to pump blood. This can cause problems over time. There are two numbers in a blood pressure reading. There is a top number (systolic) over a bottom number (diastolic). It is best to have a blood pressure below 120/80. Healthy choices can help lower your blood pressure. You may need medicine to help lower your blood pressure if:  Your blood pressure cannot be lowered with healthy choices.  Your blood pressure is higher than 130/80.  Follow these instructions at home: Eating and drinking  If directed, follow the DASH eating plan. This diet includes: ? Filling half of your plate at each meal with fruits and vegetables. ? Filling one quarter of your plate at each meal with whole grains. Whole grains include whole wheat pasta, brown rice, and whole grain bread. ? Eating or drinking low-fat dairy products, such as skim milk or low-fat yogurt. ? Filling one quarter of your plate at each meal with low-fat (lean) proteins. Low-fat proteins include fish, skinless chicken, eggs, beans, and tofu. ? Avoiding fatty meat, cured and processed meat, or chicken with skin. ? Avoiding premade or processed food.  Eat less than 1,500 mg of salt (sodium) a day.  Limit alcohol use to no more than 1 drink a day for nonpregnant women and 2 drinks a day for men. One drink equals 12 oz of beer, 5 oz of wine, or 1 oz of hard liquor. Lifestyle  Work with your doctor to stay at a healthy weight or to lose weight. Ask your doctor what the best weight is for you.  Get at least 30 minutes of exercise that causes your heart to beat faster (aerobic exercise) most days of the week. This may include walking, swimming,  or biking.  Get at least 30 minutes of exercise that strengthens your muscles (resistance exercise) at least 3 days a week. This may include lifting weights or pilates.  Do not use any products that contain nicotine or tobacco. This includes cigarettes and e-cigarettes. If you need help quitting, ask your doctor.  Check your blood pressure at home as told by your doctor.  Keep all follow-up visits as told by your doctor. This is important. Medicines  Take over-the-counter and prescription medicines only as told by your doctor. Follow directions carefully.  Do not skip doses of blood pressure medicine. The medicine does not work as well if you skip doses. Skipping doses also puts you at risk for problems.  Ask your doctor about side effects or reactions to medicines that you should watch for. Contact a doctor if:  You think you are having a reaction to the medicine you are taking.  You have headaches that keep coming back (recurring).  You feel dizzy.  You have swelling in your ankles.  You have trouble with your vision. Get help right away if:  You get a very bad headache.  You start to feel confused.  You feel weak or numb.  You feel faint.  You get very bad pain in your: ? Chest. ? Belly (abdomen).  You throw up (vomit) more than once.  You have trouble breathing. Summary  Hypertension is another name for high blood pressure.  Making healthy choices can  help lower blood pressure. If your blood pressure cannot be controlled with healthy choices, you may need to take medicine. This information is not intended to replace advice given to you by your health care provider. Make sure you discuss any questions you have with your health care provider. Document Released: 08/21/2007 Document Revised: 01/31/2016 Document Reviewed: 01/31/2016 Elsevier Interactive Patient Education  2018 [employer]. b twice every day, if not enough pain control then increase to 2 tabs twice  daily every ay and see if that helps pain more than taking it only as needed

## 2017-02-03 NOTE — Assessment & Plan Note (Signed)
Tolerating Eliquis and pacer working well. No recent concerning sympotms

## 2017-02-03 NOTE — Assessment & Plan Note (Signed)
hgba1c acceptable, minimize simple carbs. Increase exercise as tolerated. Continue current meds 

## 2017-02-28 ENCOUNTER — Other Ambulatory Visit: Payer: Self-pay | Admitting: Family Medicine

## 2017-03-17 ENCOUNTER — Ambulatory Visit: Payer: PPO | Admitting: Podiatry

## 2017-03-17 ENCOUNTER — Encounter: Payer: Self-pay | Admitting: Podiatry

## 2017-03-17 DIAGNOSIS — D689 Coagulation defect, unspecified: Secondary | ICD-10-CM | POA: Diagnosis not present

## 2017-03-17 DIAGNOSIS — M79675 Pain in left toe(s): Secondary | ICD-10-CM

## 2017-03-17 DIAGNOSIS — M79674 Pain in right toe(s): Secondary | ICD-10-CM | POA: Diagnosis not present

## 2017-03-17 DIAGNOSIS — B351 Tinea unguium: Secondary | ICD-10-CM

## 2017-03-20 DIAGNOSIS — L821 Other seborrheic keratosis: Secondary | ICD-10-CM | POA: Diagnosis not present

## 2017-03-20 DIAGNOSIS — Z85828 Personal history of other malignant neoplasm of skin: Secondary | ICD-10-CM | POA: Diagnosis not present

## 2017-03-20 DIAGNOSIS — L570 Actinic keratosis: Secondary | ICD-10-CM | POA: Diagnosis not present

## 2017-03-20 DIAGNOSIS — D485 Neoplasm of uncertain behavior of skin: Secondary | ICD-10-CM | POA: Diagnosis not present

## 2017-03-20 DIAGNOSIS — D0439 Carcinoma in situ of skin of other parts of face: Secondary | ICD-10-CM | POA: Diagnosis not present

## 2017-03-26 ENCOUNTER — Other Ambulatory Visit: Payer: Self-pay

## 2017-03-26 MED ORDER — GLIPIZIDE 10 MG PO TABS: ORAL_TABLET | ORAL | 0 refills | Status: DC

## 2017-04-02 ENCOUNTER — Ambulatory Visit (INDEPENDENT_AMBULATORY_CARE_PROVIDER_SITE_OTHER): Payer: PPO | Admitting: *Deleted

## 2017-04-02 DIAGNOSIS — I495 Sick sinus syndrome: Secondary | ICD-10-CM

## 2017-04-03 ENCOUNTER — Telehealth: Payer: Self-pay | Admitting: Family Medicine

## 2017-04-03 NOTE — Telephone Encounter (Signed)
Spoke with Vincent Allen regarding AWV. Pt declined to schedule appt.

## 2017-04-03 NOTE — Progress Notes (Signed)
Remote pacemaker transmission.   

## 2017-04-04 ENCOUNTER — Encounter: Payer: Self-pay | Admitting: Cardiology

## 2017-04-04 LAB — CUP PACEART REMOTE DEVICE CHECK
Battery Remaining Longevity: 149 mo
Brady Statistic AP VP Percent: 0 %
Brady Statistic AP VS Percent: 0 %
Brady Statistic AS VP Percent: 0 %
Brady Statistic AS VS Percent: 0 %
Brady Statistic RV Percent Paced: 99.1 %
Date Time Interrogation Session: 20190116144922
Implantable Lead Implant Date: 20180706
Implantable Lead Model: 5076
Implantable Lead Model: 5076
Lead Channel Impedance Value: 304 Ohm
Lead Channel Impedance Value: 532 Ohm
Lead Channel Impedance Value: 570 Ohm
Lead Channel Pacing Threshold Amplitude: 0.625 V
Lead Channel Pacing Threshold Pulse Width: 0.4 ms
Lead Channel Sensing Intrinsic Amplitude: 10.25 mV
Lead Channel Sensing Intrinsic Amplitude: 10.25 mV
Lead Channel Setting Pacing Amplitude: 2 V
Lead Channel Setting Pacing Pulse Width: 0.4 ms
Lead Channel Setting Sensing Sensitivity: 2.8 mV
MDC IDC LEAD IMPLANT DT: 20180706
MDC IDC LEAD LOCATION: 753859
MDC IDC LEAD LOCATION: 753860
MDC IDC MSMT BATTERY VOLTAGE: 3.12 V
MDC IDC MSMT LEADCHNL RA SENSING INTR AMPL: 1.375 mV
MDC IDC MSMT LEADCHNL RA SENSING INTR AMPL: 1.375 mV
MDC IDC MSMT LEADCHNL RV IMPEDANCE VALUE: 760 Ohm
MDC IDC PG IMPLANT DT: 20180706
MDC IDC SET LEADCHNL RV PACING AMPLITUDE: 2.5 V
MDC IDC STAT BRADY RA PERCENT PACED: 0.14 %

## 2017-04-28 ENCOUNTER — Encounter: Payer: Self-pay | Admitting: Internal Medicine

## 2017-04-28 ENCOUNTER — Ambulatory Visit: Payer: PPO | Admitting: Internal Medicine

## 2017-04-28 VITALS — BP 133/81 | HR 77 | Ht 72.0 in | Wt 219.6 lb

## 2017-04-28 DIAGNOSIS — I5032 Chronic diastolic (congestive) heart failure: Secondary | ICD-10-CM | POA: Diagnosis not present

## 2017-04-28 DIAGNOSIS — Z950 Presence of cardiac pacemaker: Secondary | ICD-10-CM

## 2017-04-28 DIAGNOSIS — I481 Persistent atrial fibrillation: Secondary | ICD-10-CM | POA: Diagnosis not present

## 2017-04-28 DIAGNOSIS — I4819 Other persistent atrial fibrillation: Secondary | ICD-10-CM

## 2017-04-28 NOTE — Patient Instructions (Signed)
Your physician wants you to follow-up in: ONE YEAR with Dr. Hilty. You will receive a reminder letter in the mail two months in advance. If you don't receive a letter, please call our office to schedule the follow-up appointment.  

## 2017-04-29 ENCOUNTER — Other Ambulatory Visit (INDEPENDENT_AMBULATORY_CARE_PROVIDER_SITE_OTHER): Payer: PPO

## 2017-04-29 DIAGNOSIS — Z23 Encounter for immunization: Secondary | ICD-10-CM

## 2017-04-29 DIAGNOSIS — E785 Hyperlipidemia, unspecified: Secondary | ICD-10-CM

## 2017-04-29 DIAGNOSIS — E669 Obesity, unspecified: Secondary | ICD-10-CM

## 2017-04-29 DIAGNOSIS — I10 Essential (primary) hypertension: Secondary | ICD-10-CM | POA: Diagnosis not present

## 2017-04-29 DIAGNOSIS — E1169 Type 2 diabetes mellitus with other specified complication: Secondary | ICD-10-CM

## 2017-04-29 DIAGNOSIS — E559 Vitamin D deficiency, unspecified: Secondary | ICD-10-CM | POA: Diagnosis not present

## 2017-04-29 LAB — CBC
HEMATOCRIT: 46.9 % (ref 39.0–52.0)
HEMOGLOBIN: 15.3 g/dL (ref 13.0–17.0)
MCHC: 32.5 g/dL (ref 30.0–36.0)
MCV: 83.7 fl (ref 78.0–100.0)
PLATELETS: 175 10*3/uL (ref 150.0–400.0)
RBC: 5.6 Mil/uL (ref 4.22–5.81)
RDW: 14.2 % (ref 11.5–15.5)
WBC: 7.3 10*3/uL (ref 4.0–10.5)

## 2017-04-29 LAB — COMPREHENSIVE METABOLIC PANEL
ALBUMIN: 3.8 g/dL (ref 3.5–5.2)
ALK PHOS: 45 U/L (ref 39–117)
ALT: 18 U/L (ref 0–53)
AST: 16 U/L (ref 0–37)
BUN: 37 mg/dL — AB (ref 6–23)
CALCIUM: 9.8 mg/dL (ref 8.4–10.5)
CHLORIDE: 106 meq/L (ref 96–112)
CO2: 23 mEq/L (ref 19–32)
Creatinine, Ser: 1.44 mg/dL (ref 0.40–1.50)
GFR: 49.95 mL/min — AB (ref >60.00)
Glucose, Bld: 147 mg/dL — ABNORMAL HIGH (ref 70–99)
POTASSIUM: 3.9 meq/L (ref 3.5–5.1)
Sodium: 139 mEq/L (ref 135–145)
TOTAL PROTEIN: 6.8 g/dL (ref 6.0–8.3)
Total Bilirubin: 0.8 mg/dL (ref 0.2–1.2)

## 2017-04-29 LAB — LIPID PANEL
CHOLESTEROL: 104 mg/dL (ref 0–200)
HDL: 34.5 mg/dL — AB (ref >39.00)
LDL CALC: 48 mg/dL (ref 0–99)
NonHDL: 69.12
Total CHOL/HDL Ratio: 3
Triglycerides: 107 mg/dL (ref 0.0–149.0)
VLDL: 21.4 mg/dL (ref 0.0–40.0)

## 2017-04-29 LAB — HEMOGLOBIN A1C: Hgb A1c MFr Bld: 7.7 % — ABNORMAL HIGH (ref 4.6–6.5)

## 2017-04-29 LAB — VITAMIN D 25 HYDROXY (VIT D DEFICIENCY, FRACTURES): VITD: 61.39 ng/mL (ref 30.00–100.00)

## 2017-04-29 LAB — TSH: TSH: 3.93 u[IU]/mL (ref 0.35–4.50)

## 2017-04-30 LAB — VARICELLA ZOSTER ANTIBODY, IGG: Varicella IgG: 761.9 index

## 2017-05-01 ENCOUNTER — Encounter: Payer: Self-pay | Admitting: Internal Medicine

## 2017-05-01 NOTE — Progress Notes (Signed)
OFFICE FOLLOW-UP NOTE  Chief Complaint:  Follow-up pacemaker  Primary Care Physician: [name], MD  HPI:  Vincent Allen is a 84 y.o. male with a past medial history significant for atrial fibrillation/flutter with slow ventricular response. Seen recently in the hospital after mechanical fall in the home. He has a history of paroxysmal atrial fibrillation in the past and was seen by Dr. Wynonia Lawman that was not on any anticoagulation. His CHADSVASC score is 4. He was started on Eliquis 2.5 mg twice a day due to age greater than 80 creatinine greater than 1.5. An echocardiogram was performed which showed normal systolic function, diastolic dysfunction moderate left atrial enlargement. I arranged for outpatient monitoring which shows persistent atrial fibrillation/flutter with controlled ventricular response and at times bradycardia but no clear long pauses or any other indications for pacemaker at this time. He reports being fatigued. He's been an active problem for more than 60 years and is interested in getting back to [place] feels that his lack of energy is keeping him back from that.  09/12/2016  Vincent Allen returns for follow-up today. He underwent recent a cardioversion by Dr. Sallyanne Kuster. This was successful and he reported feeling a little better with heart rates in sinus in the 50s for about a week, however then started to feel more fatigued. He's not been on any AV nodal blocking medications. He reports compliance with Eliquis. EKG today shows atrial fibrillation with a marked bradycardic response of 40. Notes that his heart rate will not necessarily increase with any exertion.  10/24/2016  Vincent Allen returns today for follow-up. He underwent successful pacemaker placement by Dr. Caryl Comes about 3 weeks ago. This was a Medtronic dual-chamber pacemaker. Subsequent pacemaker follow-up shows 100% atrial fibrillation. Pacemaker appears to be working properly. The wound appears to be well-healed. He is  restricted his arm movement now and is interested in more activity. He recently had lab work in advance to upcoming visit with Dr. Randel Pigg. Total cholesterol is 110, Travis was 167, HDL 37 and LDL 39. Metabolic profile was unremarkable with potassium however was slightly low at 3.4 and creatinine 1.45. Blood pressure is elevated today. I reviewed home readings indicating his blood pressure ranges from the about the 497W to 263Z systolic over 85Y diastolic. Heart rate is been mostly in the 70s and 80s. There is no evidence of rapid atrial fibrillation or bradycardia. He reports improvement in his energy level.  04/28/2017  Vincent Allen returns today for follow-up.  He has had significant improvement in his symptoms after placement of pacemaker.  He still gets a little short of breath but is not been quite as active.  Weight is somewhat better now to 19 and was as high as 225 in November.  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  Blood pressure is well controlled today.  Has had follow-up with Dr. Caryl Comes who feels the pacemaker is working appropriately.  PMHx:  Past Medical History:  Diagnosis Date  . Arthritis   . Bradycardia   . Cataract   . Chronic kidney disease stage III (GFR 30-59 ml/min) 05/03/2012  . Colon polyps   . Complication of anesthesia    emesis  . Diabetes mellitus   . GERD (gastroesophageal reflux disease)   . HOH (hard of hearing)   . HTN (hypertension) 01/13/2013  . Hyperlipidemia   . Hypertension   . Pacemaker 12/2016  . Persistent atrial fibrillation ([place])   . Presence of permanent cardiac pacemaker  09/20/2016  . Symptomatic bradycardia   . Vitamin D deficiency 07/17/2015    Past Surgical History:  Procedure Laterality Date  . ANKLE ARTHROPLASTY    . CARDIOVERSION N/A 08/20/2016   Procedure: CARDIOVERSION;  Surgeon: Sanda Klein, MD;  Location: [facility];  Service: Cardiovascular;  Laterality: N/A;  . CATARACT EXTRACTION  2010, 2014  .  CATARACT EXTRACTION  02/28/14  . CHOLECYSTECTOMY    . CHOLECYSTECTOMY N/A 05/03/2012   Procedure: LAPAROSCOPIC CHOLECYSTECTOMY WITH INTRAOPERATIVE CHOLANGIOGRAM;  Surgeon: [name], MD;  Location: [facility];  Service: General;  Laterality: N/A;  . INSERT / REPLACE / REMOVE PACEMAKER  09/20/2016  . PACEMAKER IMPLANT N/A 09/20/2016   Procedure: Pacemaker Implant;  Surgeon: Deboraha Sprang, MD;  Location: [facility];  Service: Cardiovascular;  Laterality: N/A;  . ROTATOR CUFF REPAIR    . SHOULDER SURGERY    . TONSILLECTOMY      FAMHx:  Family History  Problem Relation Age of Onset  . Cancer Mother        colon, breast, pancreas, skin cancer  . Heart disease Father        heart valve replaced  . Stroke Father   . Diabetes Father   . Cancer Paternal Grandmother        colon  . Obesity Son   . Heart disease Son        bradycardia    SOCHx:   reports that  has never smoked. he has never used smokeless tobacco. He reports that he drinks about 0.6 oz of alcohol per week. He reports that he does not use drugs.  ALLERGIES:  Allergies  Allergen Reactions  . Penicillins Hives and Itching    Has patient had a PCN reaction causing immediate rash, facial/tongue/throat swelling, SOB or lightheadedness with hypotension:Yes Has patient had a PCN reaction causing severe rash involving mucus membranes or skin necrosis:No Has patient had a PCN reaction that required hospitalization:No Has patient had a PCN reaction occurring within the last 10 years:No If all of the above answers are "NO", then may proceed with Cephalosporin use.   . Verapamil     Junctional rhythm  . Influenza Vaccines Rash    ROS: Pertinent items noted in HPI and remainder of comprehensive ROS otherwise negative.  HOME MEDS: Current Outpatient Medications on File Prior to Visit  Medication Sig Dispense Refill  . amLODipine (NORVASC) 5 MG tablet TAKE 1 TABLET BY MOUTH EVERY DAY 90 tablet 1  . atorvastatin  (LIPITOR) 20 MG tablet TAKE 1 TABLET BY MOUTH DAILY 90 tablet 3  . Calcium 600 MG tablet Take 1 tablet (600 mg total) by mouth 2 (two) times daily. 60 tablet 0  . ELIQUIS 2.5 MG TABS tablet TAKE 1 TABLET (2.5 MG TOTAL) BY MOUTH 2 (TWO) TIMES DAILY. 60 tablet 2  . furosemide (LASIX) 20 MG tablet Take 1 tablet (20 mg total) by mouth daily as needed for edema (weight gain >3lbs in 24 hrs, SOB). 30 tablet 2  . glipiZIDE (GLUCOTROL) 10 MG tablet TAKE 2 TABLETS BY MOUTH TWICE DAILY BEFORE MEALS 360 tablet 0  . glucose blood (ONE TOUCH ULTRA TEST) test strip Use as twice daily to check blood sugar.  DX E11.9 100 each 6  . JANUVIA 100 MG tablet TAKE 1/2 TO 1 TABLET DAILY 30 tablet 5  . Lancets (ONETOUCH ULTRASOFT) lancets Use as instructed to check blood sugar twice a day as needed Dx Code E11.9 100 each 6  .  metFORMIN (GLUCOPHAGE) 500 MG tablet Take by mouth daily.    Marland Kitchen triamterene-hydrochlorothiazide (MAXZIDE-25) 37.5-25 MG tablet Take 1 tablet daily by mouth. (Patient taking differently: Take 1 tablet by mouth daily. 1/2 TABLET DAILY) 90 tablet 1  . losartan (COZAAR) 50 MG tablet Take 1 tablet (50 mg total) by mouth daily. 90 tablet 3   No current facility-administered medications on file prior to visit.     LABS/IMAGING: No results found for this or any previous visit (from the past 48 hour(s)). No results found.  LIPID PANEL:    Component Value Date/Time   CHOL 104 04/29/2017 0805   TRIG 107.0 04/29/2017 0805   HDL 34.50 (L) 04/29/2017 0805   CHOLHDL 3 04/29/2017 0805   VLDL 21.4 04/29/2017 0805   LDLCALC 48 04/29/2017 0805   LDLDIRECT 70.8 03/14/2014 1401     WEIGHTS: Wt Readings from Last 3 Encounters:  04/28/17 219 lb 9.6 oz (99.6 kg)  02/03/17 225 lb (102.1 kg)  01/01/17 223 lb (101.2 kg)    VITALS: BP 133/81   Pulse 77   Ht 6' (1.829 m)   Wt 219 lb 9.6 oz (99.6 kg)   BMI 29.78 kg/m   EXAM: General appearance: alert and no distress Neck: no carotid bruit, no JVD and  thyroid not enlarged, symmetric, no tenderness/mass/nodules Lungs: clear to auscultation bilaterally Heart: regular rate and rhythm, S1, S2 normal, no murmur, click, rub or gallop and Healing pacemaker site left upper chest Abdomen: soft, non-tender; bowel sounds normal; no masses,  no organomegaly Extremities: extremities normal, atraumatic, no cyanosis or edema Pulses: 2+ and symmetric Skin: Skin color, texture, turgor normal. No rashes or lesions Neurologic: Grossly normal Psych: Pleasant  EKG: Ventricular paced rhythm at 77  ASSESSMENT: 1. Persistent atrial fibrillation/flutter with slow ventricular response - s/p Medtronic PPM (09/2016) 2. LBBB 3. Diastolic congestive heart failure 4. CKD 3A 5. CHADSVASC score 4-anticoagulated on Eliquis 2.5 mg twice a day  PLAN: 1.   Mr. River to do well since pacemaker placement.  He denies any new congestive heart failure symptoms.  His weight is actually down.  He is anticoagulated on Eliquis without any bleeding problems.  I encouraged more exercise and physical activity.  Blood pressure is well controlled today.  Follow-up with me annually or sooner as necessary.  Pixie Casino, MD, [facility], [place] Director of the [facility]  [facility] Diplomate of the American Board of Clinical Lipidology Attending Cardiologist  Direct Dial: [phone]  Fax: [phone]  Website:  [email or web address] 05/01/2017, 11:20 AM

## 2017-05-06 ENCOUNTER — Ambulatory Visit: Payer: PPO | Admitting: Family Medicine

## 2017-05-06 ENCOUNTER — Encounter: Payer: Self-pay | Admitting: Family Medicine

## 2017-05-06 DIAGNOSIS — E559 Vitamin D deficiency, unspecified: Secondary | ICD-10-CM

## 2017-05-06 DIAGNOSIS — N183 Chronic kidney disease, stage 3 (moderate): Secondary | ICD-10-CM

## 2017-05-06 DIAGNOSIS — E785 Hyperlipidemia, unspecified: Secondary | ICD-10-CM | POA: Diagnosis not present

## 2017-05-06 DIAGNOSIS — M8580 Other specified disorders of bone density and structure, unspecified site: Secondary | ICD-10-CM

## 2017-05-06 DIAGNOSIS — E0822 Diabetes mellitus due to underlying condition with diabetic chronic kidney disease: Secondary | ICD-10-CM | POA: Diagnosis not present

## 2017-05-06 DIAGNOSIS — I10 Essential (primary) hypertension: Secondary | ICD-10-CM | POA: Diagnosis not present

## 2017-05-06 NOTE — Assessment & Plan Note (Signed)
hgba1c acceptable, minimize simple carbs. Increase exercise as tolerated. Continue current med 

## 2017-05-06 NOTE — Assessment & Plan Note (Signed)
Encouraged to get adequate exercise, calcium and vitamin d intake 

## 2017-05-06 NOTE — Assessment & Plan Note (Signed)
WNL

## 2017-05-06 NOTE — Patient Instructions (Signed)
Shingrix is the new shingles shot 2 shots over 2-6 months at the pharmacy Carbohydrate Counting for Diabetes Mellitus, Adult Carbohydrate counting is a method for keeping track of how many carbohydrates you eat. Eating carbohydrates naturally increases the amount of sugar (glucose) in the blood. Counting how many carbohydrates you eat helps keep your blood glucose within normal limits, which helps you manage your diabetes (diabetes mellitus). It is important to know how many carbohydrates you can safely have in each meal. This is different for every person. A diet and nutrition specialist (registered dietitian) can help you make a meal plan and calculate how many carbohydrates you should have at each meal and snack. Carbohydrates are found in the following foods:  Grains, such as breads and cereals.  Dried beans and soy products.  Starchy vegetables, such as potatoes, peas, and corn.  Fruit and fruit juices.  Milk and yogurt.  Sweets and snack foods, such as cake, cookies, candy, chips, and soft drinks.  How do I count carbohydrates? There are two ways to count carbohydrates in food. You can use either of the methods or a combination of both. Reading "Nutrition Facts" on packaged food The "Nutrition Facts" list is included on the labels of almost all packaged foods and beverages in the [place] It includes:  The serving size.  Information about nutrients in each serving, including the grams (g) of carbohydrate per serving.  To use the "Nutrition Facts":  Decide how many servings you will have.  Multiply the number of servings by the number of carbohydrates per serving.  The resulting number is the total amount of carbohydrates that you will be having.  Learning standard serving sizes of other foods When you eat foods containing carbohydrates that are not packaged or do not include "Nutrition Facts" on the label, you need to measure the servings in order to count the amount of  carbohydrates:  Measure the foods that you will eat with a food scale or measuring cup, if needed.  Decide how many standard-size servings you will eat.  Multiply the number of servings by 15. Most carbohydrate-rich foods have about 15 g of carbohydrates per serving. ? For example, if you eat 8 oz (170 g) of strawberries, you will have eaten 2 servings and 30 g of carbohydrates (2 servings x 15 g = 30 g).  For foods that have more than one food mixed, such as soups and casseroles, you must count the carbohydrates in each food that is included.  The following list contains standard serving sizes of common carbohydrate-rich foods. Each of these servings has about 15 g of carbohydrates:   hamburger bun or  English muffin.   oz (15 mL) syrup.   oz (14 g) jelly.  1 slice of bread.  1 six-inch tortilla.  3 oz (85 g) cooked rice or pasta.  4 oz (113 g) cooked dried beans.  4 oz (113 g) starchy vegetable, such as peas, corn, or potatoes.  4 oz (113 g) hot cereal.  4 oz (113 g) mashed potatoes or  of a large baked potato.  4 oz (113 g) canned or frozen fruit.  4 oz (120 mL) fruit juice.  4-6 crackers.  6 chicken nuggets.  6 oz (170 g) unsweetened dry cereal.  6 oz (170 g) plain fat-free yogurt or yogurt sweetened with artificial sweeteners.  8 oz (240 mL) milk.  8 oz (170 g) fresh fruit or one small piece of fruit.  24 oz (680 g) popped popcorn.  Example of carbohydrate counting Sample meal  3 oz (85 g) chicken breast.  6 oz (170 g) brown rice.  4 oz (113 g) corn.  8 oz (240 mL) milk.  8 oz (170 g) strawberries with sugar-free whipped topping. Carbohydrate calculation 1. Identify the foods that contain carbohydrates: ? Rice. ? Corn. ? Milk. ? Strawberries. 2. Calculate how many servings you have of each food: ? 2 servings rice. ? 1 serving corn. ? 1 serving milk. ? 1 serving strawberries. 3. Multiply each number of servings by 15 g: ? 2 servings  rice x 15 g = 30 g. ? 1 serving corn x 15 g = 15 g. ? 1 serving milk x 15 g = 15 g. ? 1 serving strawberries x 15 g = 15 g. 4. Add together all of the amounts to find the total grams of carbohydrates eaten: ? 30 g + 15 g + 15 g + 15 g = 75 g of carbohydrates total. This information is not intended to replace advice given to you by your health care provider. Make sure you discuss any questions you have with your health care provider. Document Released: 03/04/2005 Document Revised: 09/22/2015 Document Reviewed: 08/16/2015 Elsevier Interactive Patient Education  2018 [employer]  

## 2017-05-06 NOTE — Progress Notes (Signed)
Subjective:  I acted as a Education administrator for [employer]. Vincent Allen, [name]   Patient ID: Vincent Allen, male    DOB: February 13, 1934, 84 y.o.   MRN: [id number]  Chief Complaint  Patient presents with  . Follow-up    HPI  Patient is in today for 3 month follow up visit.and he reports he feels well. No recent febrile illness or hospitalizations. He acknowledges he did not follow a diabetic diet well over the holidays. Denies polyuria or polydipsia. Denies CP/palp/SOB/HA/congestion/fevers/GI or GU c/o. Taking meds as prescribed  Patient Care Team: [name], MD as PCP - General (Family Medicine) Clent Jacks, MD as Consulting Physician (Ophthalmology) Danella Sensing, MD as Consulting Physician (Dermatology) Dorothy Spark, MD as Consulting Physician (Cardiology) [name], MD as Consulting Physician (Orthopedic Surgery) Paulla Dolly Tamala Fothergill, DPM as Consulting Physician (Podiatry)   Past Medical History:  Diagnosis Date  . Arthritis   . Bradycardia   . Cataract   . Chronic kidney disease stage III (GFR 30-59 ml/min) 05/03/2012  . Colon polyps   . Complication of anesthesia    emesis  . Diabetes mellitus   . GERD (gastroesophageal reflux disease)   . HOH (hard of hearing)   . HTN (hypertension) 01/13/2013  . Hyperlipidemia   . Hypertension   . Pacemaker 12/2016  . Persistent atrial fibrillation ([place])   . Presence of permanent cardiac pacemaker 09/20/2016  . Symptomatic bradycardia   . Vitamin D deficiency 07/17/2015    Past Surgical History:  Procedure Laterality Date  . ANKLE ARTHROPLASTY    . CARDIOVERSION N/A 08/20/2016   Procedure: CARDIOVERSION;  Surgeon: Sanda Klein, MD;  Location: [facility];  Service: Cardiovascular;  Laterality: N/A;  . CATARACT EXTRACTION  2010, 2014  . CATARACT EXTRACTION  02/28/14  . CHOLECYSTECTOMY    . CHOLECYSTECTOMY N/A 05/03/2012   Procedure: LAPAROSCOPIC CHOLECYSTECTOMY WITH INTRAOPERATIVE CHOLANGIOGRAM;  Surgeon: [name], MD;   Location: [facility];  Service: General;  Laterality: N/A;  . INSERT / REPLACE / REMOVE PACEMAKER  09/20/2016  . PACEMAKER IMPLANT N/A 09/20/2016   Procedure: Pacemaker Implant;  Surgeon: Deboraha Sprang, MD;  Location: [facility];  Service: Cardiovascular;  Laterality: N/A;  . ROTATOR CUFF REPAIR    . SHOULDER SURGERY    . TONSILLECTOMY      Family History  Problem Relation Age of Onset  . Cancer Mother        colon, breast, pancreas, skin cancer  . Heart disease Father        heart valve replaced  . Stroke Father   . Diabetes Father   . Cancer Paternal Grandmother        colon  . Obesity Son   . Heart disease Son        bradycardia    Social History   Socioeconomic History  . Marital status: Widowed    Spouse name: Not on file  . Number of children: Not on file  . Years of education: Not on file  . Highest education level: Not on file  Social Needs  . Financial resource strain: Not on file  . Food insecurity - worry: Not on file  . Food insecurity - inability: Not on file  . Transportation needs - medical: Not on file  . Transportation needs - non-medical: Not on file  Occupational History  . Not on file  Tobacco Use  . Smoking status: Never Smoker  . Smokeless tobacco: Never Used  Substance and  Sexual Activity  . Alcohol use: Yes    Alcohol/week: 0.6 oz    Types: 1 Glasses of wine per week  . Drug use: No  . 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.  Other Topics Concern  . Not on file  Social History Narrative  . Not on file    Outpatient Medications Prior to Visit  Medication Sig Dispense Refill  . amLODipine (NORVASC) 5 MG tablet TAKE 1 TABLET BY MOUTH EVERY DAY 90 tablet 1  . atorvastatin (LIPITOR) 20 MG tablet TAKE 1 TABLET BY MOUTH DAILY 90 tablet 3  . Calcium 600 MG tablet Take 1 tablet (600 mg total) by mouth 2 (two) times daily. 60 tablet 0    . ELIQUIS 2.5 MG TABS tablet TAKE 1 TABLET (2.5 MG TOTAL) BY MOUTH 2 (TWO) TIMES DAILY. 60 tablet 2  . furosemide (LASIX) 20 MG tablet Take 1 tablet (20 mg total) by mouth daily as needed for edema (weight gain >3lbs in 24 hrs, SOB). 30 tablet 2  . glipiZIDE (GLUCOTROL) 10 MG tablet TAKE 2 TABLETS BY MOUTH TWICE DAILY BEFORE MEALS 360 tablet 0  . glucose blood (ONE TOUCH ULTRA TEST) test strip Use as twice daily to check blood sugar.  DX E11.9 100 each 6  . JANUVIA 100 MG tablet TAKE 1/2 TO 1 TABLET DAILY 30 tablet 5  . Lancets (ONETOUCH ULTRASOFT) lancets Use as instructed to check blood sugar twice a day as needed Dx Code E11.9 100 each 6  . metFORMIN (GLUCOPHAGE) 500 MG tablet Take by mouth daily.    Marland Kitchen triamterene-hydrochlorothiazide (MAXZIDE-25) 37.5-25 MG tablet Take 1 tablet daily by mouth. (Patient taking differently: Take 1 tablet by mouth daily. 1/2 TABLET DAILY) 90 tablet 1  . losartan (COZAAR) 50 MG tablet Take 1 tablet (50 mg total) by mouth daily. 90 tablet 3   No facility-administered medications prior to visit.     Allergies  Allergen Reactions  . Penicillins Hives and Itching    Has patient had a PCN reaction causing immediate rash, facial/tongue/throat swelling, SOB or lightheadedness with hypotension:Yes Has patient had a PCN reaction causing severe rash involving mucus membranes or skin necrosis:No Has patient had a PCN reaction that required hospitalization:No Has patient had a PCN reaction occurring within the last 10 years:No If all of the above answers are "NO", then may proceed with Cephalosporin use.   . Verapamil     Junctional rhythm  . Influenza Vaccines Rash    Review of Systems  Constitutional: Negative for fever and malaise/fatigue.  HENT: Negative for congestion.   Eyes: Negative for blurred vision.  Respiratory: Negative for shortness of breath.   Cardiovascular: Negative for chest pain, palpitations and leg swelling.  Gastrointestinal: Negative for  abdominal pain, blood in stool and nausea.  Genitourinary: Negative for dysuria and frequency.  Musculoskeletal: Negative for falls.  Skin: Negative for rash.  Neurological: Negative for dizziness, loss of consciousness and headaches.  Endo/Heme/Allergies: Negative for environmental allergies.  Psychiatric/Behavioral: Negative for depression. The patient is not nervous/anxious.        Objective:    Physical Exam  Constitutional: He is oriented to person, place, and time. He appears well-developed and well-nourished. No distress.  HENT:  Head: Normocephalic and atraumatic.  Nose: Nose normal.  Eyes: Right eye exhibits no discharge. Left eye exhibits no discharge.  Neck: Normal range of motion.  Neck supple.  Cardiovascular: Normal rate and regular rhythm.  No murmur heard. Pulmonary/Chest: Effort normal and breath sounds normal.  Abdominal: Soft. Bowel sounds are normal. There is no tenderness.  Musculoskeletal: He exhibits no edema.  Neurological: He is alert and oriented to person, place, and time.  Skin: Skin is warm and dry.  Psychiatric: He has a normal mood and affect.  Nursing note and vitals reviewed.   BP 118/68 (BP Location: Left Arm, Patient Position: Sitting, Cuff Size: Large)   Pulse 68   Temp 97.6 F (36.4 C) (Oral)   Resp 14   Wt 224 lb 3.2 oz (101.7 kg)   HC 72" (182.9 cm)   SpO2 95%   BMI 30.41 kg/m  Wt Readings from Last 3 Encounters:  05/06/17 224 lb 3.2 oz (101.7 kg)  04/28/17 219 lb 9.6 oz (99.6 kg)  02/03/17 225 lb (102.1 kg)   BP Readings from Last 3 Encounters:  05/06/17 118/68  04/28/17 133/81  02/03/17 120/68     Immunization History  Administered Date(s) Administered  . Pneumococcal Conjugate-13 03/23/2015  . Pneumococcal Polysaccharide-23 06/27/2011  . Tdap 05/19/2014    Health Maintenance  Topic Date Due  . FOOT EXAM  07/16/2016  . INFLUENZA VACCINE  01/06/2018 (Originally 10/16/2016)  . OPHTHALMOLOGY EXAM  05/22/2017  . URINE  MICROALBUMIN  07/15/2017  . HEMOGLOBIN A1C  10/27/2017  . TETANUS/TDAP  05/18/2024  . PNA vac Low Risk Adult  Completed    Lab Results  Component Value Date   WBC 7.3 04/29/2017   HGB 15.3 04/29/2017   HCT 46.9 04/29/2017   PLT 175.0 04/29/2017   GLUCOSE 147 (H) 04/29/2017   CHOL 104 04/29/2017   TRIG 107.0 04/29/2017   HDL 34.50 (L) 04/29/2017   LDLDIRECT 70.8 03/14/2014   LDLCALC 48 04/29/2017   ALT 18 04/29/2017   AST 16 04/29/2017   NA 139 04/29/2017   K 3.9 04/29/2017   CL 106 04/29/2017   CREATININE 1.44 04/29/2017   BUN 37 (H) 04/29/2017   CO2 23 04/29/2017   TSH 3.93 04/29/2017   PSA 1.18 10/07/2012   INR 1.0 08/15/2016   HGBA1C 7.7 (H) 04/29/2017   MICROALBUR 26.7 (H) 07/15/2016    Lab Results  Component Value Date   TSH 3.93 04/29/2017   Lab Results  Component Value Date   WBC 7.3 04/29/2017   HGB 15.3 04/29/2017   HCT 46.9 04/29/2017   MCV 83.7 04/29/2017   PLT 175.0 04/29/2017   Lab Results  Component Value Date   NA 139 04/29/2017   K 3.9 04/29/2017   CO2 23 04/29/2017   GLUCOSE 147 (H) 04/29/2017   BUN 37 (H) 04/29/2017   CREATININE 1.44 04/29/2017   BILITOT 0.8 04/29/2017   ALKPHOS 45 04/29/2017   AST 16 04/29/2017   ALT 18 04/29/2017   PROT 6.8 04/29/2017   ALBUMIN 3.8 04/29/2017   CALCIUM 9.8 04/29/2017   ANIONGAP 8 07/10/2016   GFR 49.95 (L) 04/29/2017   Lab Results  Component Value Date   CHOL 104 04/29/2017   Lab Results  Component Value Date   HDL 34.50 (L) 04/29/2017   Lab Results  Component Value Date   LDLCALC 48 04/29/2017   Lab Results  Component Value Date   TRIG 107.0 04/29/2017   Lab Results  Component Value Date   CHOLHDL 3 04/29/2017   Lab Results  Component Value Date   HGBA1C 7.7 (H) 04/29/2017  Assessment & Plan:   Problem List Items Addressed This Visit    Diabetes mellitus with chronic kidney disease (HCC) (Chronic)    hgba1c acceptable, minimize simple carbs. Increase exercise as  tolerated. Continue current med      Relevant Orders   Hemoglobin A1c   TSH   Hyperlipidemia (Chronic)    Tolerating statin, encouraged heart healthy diet, avoid trans fats, minimize simple carbs and saturated fats. Increase exercise as tolerated      Relevant Orders   Lipid panel   Osteopenia    Encouraged to get adequate exercise, calcium and vitamin d intake      Essential hypertension    Well controlled, no changes to meds. Encouraged heart healthy diet such as the DASH diet and exercise as tolerated.       Relevant Orders   CBC   Comprehensive metabolic panel   TSH   Vitamin D deficiency    WNL          I am having Khalfani C. [employer]" maintain his calcium carbonate, glucose blood, onetouch ultrasoft, atorvastatin, losartan, furosemide, JANUVIA, amLODipine, triamterene-hydrochlorothiazide, ELIQUIS, glipiZIDE, and metFORMIN.  No orders of the defined types were placed in this encounter.   CMA served as Education administrator during this visit. History, Physical and Plan performed by medical provider. Documentation and orders reviewed and attested to.  [name], MD

## 2017-05-06 NOTE — Assessment & Plan Note (Signed)
Tolerating statin, encouraged heart healthy diet, avoid trans fats, minimize simple carbs and saturated fats. Increase exercise as tolerated 

## 2017-05-06 NOTE — Assessment & Plan Note (Signed)
Well controlled, no changes to meds. Encouraged heart healthy diet such as the DASH diet and exercise as tolerated.  °

## 2017-05-22 DIAGNOSIS — H353131 Nonexudative age-related macular degeneration, bilateral, early dry stage: Secondary | ICD-10-CM | POA: Diagnosis not present

## 2017-05-22 DIAGNOSIS — H04123 Dry eye syndrome of bilateral lacrimal glands: Secondary | ICD-10-CM | POA: Diagnosis not present

## 2017-05-22 DIAGNOSIS — E119 Type 2 diabetes mellitus without complications: Secondary | ICD-10-CM | POA: Diagnosis not present

## 2017-05-22 DIAGNOSIS — H26492 Other secondary cataract, left eye: Secondary | ICD-10-CM | POA: Diagnosis not present

## 2017-05-22 DIAGNOSIS — Z961 Presence of intraocular lens: Secondary | ICD-10-CM | POA: Diagnosis not present

## 2017-05-22 LAB — HM DIABETES EYE EXAM

## 2017-05-26 ENCOUNTER — Other Ambulatory Visit: Payer: Self-pay | Admitting: Family Medicine

## 2017-05-28 ENCOUNTER — Telehealth: Payer: Self-pay | Admitting: *Deleted

## 2017-05-28 NOTE — Telephone Encounter (Signed)
Received Eye Exam results from [facility]; forwarded to provider/SLS 03/13

## 2017-05-30 ENCOUNTER — Other Ambulatory Visit: Payer: Self-pay | Admitting: Family Medicine

## 2017-06-11 ENCOUNTER — Ambulatory Visit (INDEPENDENT_AMBULATORY_CARE_PROVIDER_SITE_OTHER): Payer: PPO | Admitting: Podiatry

## 2017-06-11 DIAGNOSIS — D689 Coagulation defect, unspecified: Secondary | ICD-10-CM

## 2017-06-11 DIAGNOSIS — B351 Tinea unguium: Secondary | ICD-10-CM | POA: Diagnosis not present

## 2017-06-11 DIAGNOSIS — M79675 Pain in left toe(s): Secondary | ICD-10-CM

## 2017-06-11 DIAGNOSIS — M79674 Pain in right toe(s): Secondary | ICD-10-CM

## 2017-06-11 NOTE — Progress Notes (Signed)
Subjective:   Patient ID: Vincent Allen, male   DOB: 84 y.o.   MRN: [id number]   HPI Patient presents with thickened nailbeds 1-5 both feet are thick incurvated and tender.  He cannot cut them himself and he is also on blood thinner   ROS      Objective:  Physical Exam  Neurovascular status intact with thick brittle nailbeds 1-5 both feet that are incurvated     Assessment:  Mycotic nail infection with pain 1-5 both feet     Plan:  Debride painful nailbeds 1-5 both feet with no iatrogenic bleeding noted

## 2017-06-16 ENCOUNTER — Encounter: Payer: Self-pay | Admitting: Internal Medicine

## 2017-06-20 ENCOUNTER — Other Ambulatory Visit: Payer: Self-pay | Admitting: Family Medicine

## 2017-06-21 ENCOUNTER — Other Ambulatory Visit: Payer: Self-pay | Admitting: Family Medicine

## 2017-06-23 ENCOUNTER — Other Ambulatory Visit: Payer: Self-pay | Admitting: Family Medicine

## 2017-07-02 ENCOUNTER — Ambulatory Visit (INDEPENDENT_AMBULATORY_CARE_PROVIDER_SITE_OTHER): Payer: PPO | Admitting: *Deleted

## 2017-07-02 DIAGNOSIS — I495 Sick sinus syndrome: Secondary | ICD-10-CM | POA: Diagnosis not present

## 2017-07-02 NOTE — Progress Notes (Signed)
Remote pacemaker transmission.   

## 2017-07-03 ENCOUNTER — Encounter: Payer: Self-pay | Admitting: Cardiology

## 2017-07-03 LAB — CUP PACEART REMOTE DEVICE CHECK
Battery Voltage: 3.06 V
Brady Statistic AP VP Percent: 0 %
Brady Statistic AS VP Percent: 0 %
Brady Statistic AS VS Percent: 0 %
Brady Statistic RA Percent Paced: 0.12 %
Date Time Interrogation Session: 20190417052252
Implantable Lead Implant Date: 20180706
Implantable Lead Location: 753859
Implantable Lead Location: 753860
Implantable Lead Model: 5076
Implantable Pulse Generator Implant Date: 20180706
Lead Channel Impedance Value: 608 Ohm
Lead Channel Pacing Threshold Pulse Width: 0.4 ms
Lead Channel Sensing Intrinsic Amplitude: 0.75 mV
Lead Channel Sensing Intrinsic Amplitude: 0.75 mV
Lead Channel Sensing Intrinsic Amplitude: 12.625 mV
Lead Channel Setting Pacing Amplitude: 2.5 V
Lead Channel Setting Pacing Pulse Width: 0.4 ms
Lead Channel Setting Sensing Sensitivity: 2.8 mV
MDC IDC LEAD IMPLANT DT: 20180706
MDC IDC MSMT BATTERY REMAINING LONGEVITY: 147 mo
MDC IDC MSMT LEADCHNL RA IMPEDANCE VALUE: 323 Ohm
MDC IDC MSMT LEADCHNL RA IMPEDANCE VALUE: 551 Ohm
MDC IDC MSMT LEADCHNL RV IMPEDANCE VALUE: 779 Ohm
MDC IDC MSMT LEADCHNL RV PACING THRESHOLD AMPLITUDE: 0.75 V
MDC IDC MSMT LEADCHNL RV SENSING INTR AMPL: 12.625 mV
MDC IDC SET LEADCHNL RA PACING AMPLITUDE: 2 V
MDC IDC STAT BRADY AP VS PERCENT: 0 %
MDC IDC STAT BRADY RV PERCENT PACED: 99.32 %

## 2017-07-30 ENCOUNTER — Other Ambulatory Visit (INDEPENDENT_AMBULATORY_CARE_PROVIDER_SITE_OTHER): Payer: PPO

## 2017-07-30 DIAGNOSIS — E785 Hyperlipidemia, unspecified: Secondary | ICD-10-CM | POA: Diagnosis not present

## 2017-07-30 DIAGNOSIS — E0822 Diabetes mellitus due to underlying condition with diabetic chronic kidney disease: Secondary | ICD-10-CM | POA: Diagnosis not present

## 2017-07-30 DIAGNOSIS — I10 Essential (primary) hypertension: Secondary | ICD-10-CM | POA: Diagnosis not present

## 2017-07-30 DIAGNOSIS — N183 Chronic kidney disease, stage 3 (moderate): Secondary | ICD-10-CM | POA: Diagnosis not present

## 2017-07-30 LAB — CBC
HEMATOCRIT: 47.1 % (ref 39.0–52.0)
Hemoglobin: 15.7 g/dL (ref 13.0–17.0)
MCHC: 33.3 g/dL (ref 30.0–36.0)
MCV: 83.6 fl (ref 78.0–100.0)
PLATELETS: 174 10*3/uL (ref 150.0–400.0)
RBC: 5.63 Mil/uL (ref 4.22–5.81)
RDW: 13.5 % (ref 11.5–15.5)
WBC: 6.6 10*3/uL (ref 4.0–10.5)

## 2017-07-30 LAB — COMPREHENSIVE METABOLIC PANEL
ALT: 21 U/L (ref 0–53)
AST: 16 U/L (ref 0–37)
Albumin: 3.7 g/dL (ref 3.5–5.2)
Alkaline Phosphatase: 44 U/L (ref 39–117)
BUN: 28 mg/dL — ABNORMAL HIGH (ref 6–23)
CALCIUM: 10 mg/dL (ref 8.4–10.5)
CHLORIDE: 104 meq/L (ref 96–112)
CO2: 22 meq/L (ref 19–32)
CREATININE: 1.53 mg/dL — AB (ref 0.40–1.50)
GFR: 46.54 mL/min — ABNORMAL LOW (ref >60.00)
Glucose, Bld: 117 mg/dL — ABNORMAL HIGH (ref 70–99)
Potassium: 4.2 mEq/L (ref 3.5–5.1)
Sodium: 140 mEq/L (ref 135–145)
Total Bilirubin: 0.6 mg/dL (ref 0.2–1.2)
Total Protein: 6.6 g/dL (ref 6.0–8.3)

## 2017-07-30 LAB — LIPID PANEL
CHOL/HDL RATIO: 3
Cholesterol: 109 mg/dL (ref 0–200)
HDL: 36.4 mg/dL — ABNORMAL LOW (ref >39.00)
LDL Cholesterol: 54 mg/dL (ref 0–99)
NONHDL: 72.1
TRIGLYCERIDES: 89 mg/dL (ref 0.0–149.0)
VLDL: 17.8 mg/dL (ref 0.0–40.0)

## 2017-07-30 LAB — TSH: TSH: 3.92 u[IU]/mL (ref 0.35–4.50)

## 2017-07-30 LAB — HEMOGLOBIN A1C: HEMOGLOBIN A1C: 7.4 % — AB (ref 4.6–6.5)

## 2017-08-05 ENCOUNTER — Ambulatory Visit (INDEPENDENT_AMBULATORY_CARE_PROVIDER_SITE_OTHER): Payer: PPO | Admitting: Family Medicine

## 2017-08-05 ENCOUNTER — Encounter: Payer: Self-pay | Admitting: Family Medicine

## 2017-08-05 DIAGNOSIS — I10 Essential (primary) hypertension: Secondary | ICD-10-CM

## 2017-08-05 DIAGNOSIS — E559 Vitamin D deficiency, unspecified: Secondary | ICD-10-CM

## 2017-08-05 DIAGNOSIS — E785 Hyperlipidemia, unspecified: Secondary | ICD-10-CM

## 2017-08-05 DIAGNOSIS — E1169 Type 2 diabetes mellitus with other specified complication: Secondary | ICD-10-CM

## 2017-08-05 DIAGNOSIS — E669 Obesity, unspecified: Secondary | ICD-10-CM | POA: Diagnosis not present

## 2017-08-05 NOTE — Assessment & Plan Note (Signed)
Encouraged heart healthy diet, increase exercise, avoid trans fats, consider a krill oil cap daily 

## 2017-08-05 NOTE — Assessment & Plan Note (Signed)
hgba1c acceptable, minimize simple carbs. Increase exercise as tolerated.  

## 2017-08-05 NOTE — Assessment & Plan Note (Signed)
Well controlled, no changes to meds. Encouraged heart healthy diet such as the DASH diet and exercise as tolerated.  °

## 2017-08-05 NOTE — Patient Instructions (Signed)
Omron upper arm BP cuff  DASH or MIND diet Hypertension Hypertension is another name for high blood pressure. High blood pressure forces your heart to work harder to pump blood. This can cause problems over time. There are two numbers in a blood pressure reading. There is a top number (systolic) over a bottom number (diastolic). It is best to have a blood pressure below 120/80. Healthy choices can help lower your blood pressure. You may need medicine to help lower your blood pressure if:  Your blood pressure cannot be lowered with healthy choices.  Your blood pressure is higher than 130/80.  Follow these instructions at home: Eating and drinking  If directed, follow the DASH eating plan. This diet includes: ? Filling half of your plate at each meal with fruits and vegetables. ? Filling one quarter of your plate at each meal with whole grains. Whole grains include whole wheat pasta, brown rice, and whole grain bread. ? Eating or drinking low-fat dairy products, such as skim milk or low-fat yogurt. ? Filling one quarter of your plate at each meal with low-fat (lean) proteins. Low-fat proteins include fish, skinless chicken, eggs, beans, and tofu. ? Avoiding fatty meat, cured and processed meat, or chicken with skin. ? Avoiding premade or processed food.  Eat less than 1,500 mg of salt (sodium) a day.  Limit alcohol use to no more than 1 drink a day for nonpregnant women and 2 drinks a day for men. One drink equals 12 oz of beer, 5 oz of wine, or 1 oz of hard liquor. Lifestyle  Work with your doctor to stay at a healthy weight or to lose weight. Ask your doctor what the best weight is for you.  Get at least 30 minutes of exercise that causes your heart to beat faster (aerobic exercise) most days of the week. This may include walking, swimming, or biking.  Get at least 30 minutes of exercise that strengthens your muscles (resistance exercise) at least 3 days a week. This may include  lifting weights or pilates.  Do not use any products that contain nicotine or tobacco. This includes cigarettes and e-cigarettes. If you need help quitting, ask your doctor.  Check your blood pressure at home as told by your doctor.  Keep all follow-up visits as told by your doctor. This is important. Medicines  Take over-the-counter and prescription medicines only as told by your doctor. Follow directions carefully.  Do not skip doses of blood pressure medicine. The medicine does not work as well if you skip doses. Skipping doses also puts you at risk for problems.  Ask your doctor about side effects or reactions to medicines that you should watch for. Contact a doctor if:  You think you are having a reaction to the medicine you are taking.  You have headaches that keep coming back (recurring).  You feel dizzy.  You have swelling in your ankles.  You have trouble with your vision. Get help right away if:  You get a very bad headache.  You start to feel confused.  You feel weak or numb.  You feel faint.  You get very bad pain in your: ? Chest. ? Belly (abdomen).  You throw up (vomit) more than once.  You have trouble breathing. Summary  Hypertension is another name for high blood pressure.  Making healthy choices can help lower blood pressure. If your blood pressure cannot be controlled with healthy choices, you may need to take medicine. This information is not intended  to replace advice given to you by your health care provider. Make sure you discuss any questions you have with your health care provider. Document Released: 08/21/2007 Document Revised: 01/31/2016 Document Reviewed: 01/31/2016 Elsevier Interactive Patient Education  [employer].

## 2017-08-05 NOTE — Progress Notes (Signed)
Subjective:  I acted as a Education administrator for Dr. [name]. [place], [place]  Patient ID: Vincent Allen, male    DOB: 08-30-1933, 84 y.o.   MRN: [id number]  No chief complaint on file.   HPI  Patient is in today for a 3 month follow up. He is following up on his hypertension, DM, obesity and hyperlipidemia. He denies any recent febrile illness or hospitalizations. No polyuria or polydipsia. He has not been consistently following a diabetic diet but he has cut down on his carbs some.Denies CP/palp/SOB/HA/congestion/fevers/GI or GU c/o. Taking meds as prescribed  Patient Care Team: [name], MD as PCP - General (Family Medicine) Clent Jacks, MD as Consulting Physician (Ophthalmology) Danella Sensing, MD as Consulting Physician (Dermatology) Dorothy Spark, MD as Consulting Physician (Cardiology) [name], MD as Consulting Physician (Orthopedic Surgery) Paulla Dolly Tamala Fothergill, DPM as Consulting Physician (Podiatry)   Past Medical History:  Diagnosis Date  . Arthritis   . Bradycardia   . Cataract   . Chronic kidney disease stage III (GFR 30-59 ml/min) 05/03/2012  . Colon polyps   . Complication of anesthesia    emesis  . Diabetes mellitus   . GERD (gastroesophageal reflux disease)   . HOH (hard of hearing)   . HTN (hypertension) 01/13/2013  . Hyperlipidemia   . Hypertension   . Pacemaker 12/2016  . Persistent atrial fibrillation ([place])   . Presence of permanent cardiac pacemaker 09/20/2016  . Symptomatic bradycardia   . Vitamin D deficiency 07/17/2015    Past Surgical History:  Procedure Laterality Date  . ANKLE ARTHROPLASTY    . CARDIOVERSION N/A 08/20/2016   Procedure: CARDIOVERSION;  Surgeon: Sanda Klein, MD;  Location: [facility];  Service: Cardiovascular;  Laterality: N/A;  . CATARACT EXTRACTION  2010, 2014  . CATARACT EXTRACTION  02/28/14  . CHOLECYSTECTOMY    . CHOLECYSTECTOMY N/A 05/03/2012   Procedure: LAPAROSCOPIC CHOLECYSTECTOMY WITH INTRAOPERATIVE  CHOLANGIOGRAM;  Surgeon: [name], MD;  Location: [facility];  Service: General;  Laterality: N/A;  . INSERT / REPLACE / REMOVE PACEMAKER  09/20/2016  . PACEMAKER IMPLANT N/A 09/20/2016   Procedure: Pacemaker Implant;  Surgeon: Deboraha Sprang, MD;  Location: [facility];  Service: Cardiovascular;  Laterality: N/A;  . ROTATOR CUFF REPAIR    . SHOULDER SURGERY    . TONSILLECTOMY      Family History  Problem Relation Age of Onset  . Cancer Mother        colon, breast, pancreas, skin cancer  . Heart disease Father        heart valve replaced  . Stroke Father   . Diabetes Father   . Cancer Paternal Grandmother        colon  . Obesity Son   . Heart disease Son        bradycardia    Social History   Socioeconomic History  . Marital status: Widowed    Spouse name: Not on file  . Number of children: Not on file  . Years of education: Not on file  . Highest education level: Not on file  Occupational History  . Not on file  Social Needs  . Financial resource strain: Not on file  . Food insecurity:    Worry: Not on file    Inability: Not on file  . Transportation needs:    Medical: Not on file    Non-medical: Not on file  Tobacco Use  . Smoking status: Never Smoker  .  Smokeless tobacco: Never Used  Substance and Sexual Activity  . Alcohol use: Yes    Alcohol/week: 0.6 oz    Types: 1 Glasses of wine per week  . Drug use: No  . 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.  Lifestyle  . Physical activity:    Days per week: Not on file    Minutes per session: Not on file  . Stress: Not on file  Relationships  . Social connections:    Talks on phone: Not on file    Gets together: Not on file    Attends religious service: Not on file    Active member of club or organization: Not on file    Attends meetings of clubs or organizations: Not on file    Relationship  status: Not on file  . Intimate partner violence:    Fear of current or ex partner: Not on file    Emotionally abused: Not on file    Physically abused: Not on file    Forced sexual activity: Not on file  Other Topics Concern  . Not on file  Social History Narrative  . Not on file    Outpatient Medications Prior to Visit  Medication Sig Dispense Refill  . amLODipine (NORVASC) 5 MG tablet TAKE 1 TABLET BY MOUTH EVERY DAY 90 tablet 1  . atorvastatin (LIPITOR) 20 MG tablet TAKE 1 TABLET BY MOUTH DAILY 90 tablet 1  . Calcium 600 MG tablet Take 1 tablet (600 mg total) by mouth 2 (two) times daily. 60 tablet 0  . ELIQUIS 2.5 MG TABS tablet TAKE 1 TABLET (2.5 MG TOTAL) BY MOUTH 2 (TWO) TIMES DAILY. 60 tablet 2  . furosemide (LASIX) 20 MG tablet Take 1 tablet (20 mg total) by mouth daily as needed for edema (weight gain >3lbs in 24 hrs, SOB). 30 tablet 2  . glipiZIDE (GLUCOTROL) 10 MG tablet TAKE 2 TABLETS BY MOUTH TWICE DAILY BEFORE MEALS 360 tablet 0  . glucose blood (ONE TOUCH ULTRA TEST) test strip Use as twice daily to check blood sugar.  DX E11.9 100 each 6  . JANUVIA 100 MG tablet TAKE 1/2 TO 1 TABLET DAILY 30 tablet 4  . Lancets (ONETOUCH ULTRASOFT) lancets Use as instructed to check blood sugar twice a day as needed Dx Code E11.9 100 each 6  . losartan (COZAAR) 50 MG tablet Take 1 tablet (50 mg total) by mouth daily. 90 tablet 3  . metFORMIN (GLUCOPHAGE) 500 MG tablet Take by mouth daily.    Marland Kitchen triamterene-hydrochlorothiazide (MAXZIDE-25) 37.5-25 MG tablet Take 1 tablet daily by mouth. (Patient taking differently: Take 1 tablet by mouth daily. 1/2 TABLET DAILY) 90 tablet 1   No facility-administered medications prior to visit.     Allergies  Allergen Reactions  . Penicillins Hives and Itching    Has patient had a PCN reaction causing immediate rash, facial/tongue/throat swelling, SOB or lightheadedness with hypotension:Yes Has patient had a PCN reaction causing severe rash involving  mucus membranes or skin necrosis:No Has patient had a PCN reaction that required hospitalization:No Has patient had a PCN reaction occurring within the last 10 years:No If all of the above answers are "NO", then may proceed with Cephalosporin use.   . Verapamil     Junctional rhythm  . Influenza Vaccines Rash    Review of Systems  Constitutional: Negative for fever and malaise/fatigue.  HENT: Negative for congestion.   Eyes: Negative for blurred vision.  Respiratory: Negative for shortness of breath.   Cardiovascular: Negative for chest pain, palpitations and leg swelling.  Gastrointestinal: Negative for abdominal pain, blood in stool and nausea.  Genitourinary: Negative for dysuria and frequency.  Musculoskeletal: Negative for falls.  Skin: Negative for rash.  Neurological: Negative for dizziness, loss of consciousness and headaches.  Endo/Heme/Allergies: Negative for environmental allergies.  Psychiatric/Behavioral: Negative for depression. The patient is not nervous/anxious.        Objective:    Physical Exam  Constitutional: He is oriented to person, place, and time. He appears well-developed and well-nourished. No distress.  HENT:  Head: Normocephalic and atraumatic.  Nose: Nose normal.  Eyes: Right eye exhibits no discharge. Left eye exhibits no discharge.  Neck: Normal range of motion. Neck supple.  Cardiovascular: Normal rate and regular rhythm.  No murmur heard. Pulmonary/Chest: Effort normal and breath sounds normal.  Abdominal: Soft. Bowel sounds are normal. There is no tenderness.  Musculoskeletal: He exhibits no edema.  Neurological: He is alert and oriented to person, place, and time.  Skin: Skin is warm and dry.  Psychiatric: He has a normal mood and affect.  Nursing note and vitals reviewed.   BP 134/66 (BP Location: Left Arm, Patient Position: Sitting, Cuff Size: Normal)   Pulse 85   Temp 97.9 F (36.6 C) (Oral)   Resp 18   Wt 223 lb (101.2 kg)    SpO2 97%   BMI 30.24 kg/m  Wt Readings from Last 3 Encounters:  08/05/17 223 lb (101.2 kg)  05/06/17 224 lb 3.2 oz (101.7 kg)  04/28/17 219 lb 9.6 oz (99.6 kg)   BP Readings from Last 3 Encounters:  08/05/17 134/66  05/06/17 118/68  04/28/17 133/81     Immunization History  Administered Date(s) Administered  . Pneumococcal Conjugate-13 03/23/2015  . Pneumococcal Polysaccharide-23 06/27/2011  . Tdap 05/19/2014    Health Maintenance  Topic Date Due  . FOOT EXAM  07/16/2016  . URINE MICROALBUMIN  07/15/2017  . INFLUENZA VACCINE  01/06/2018 (Originally 10/16/2017)  . HEMOGLOBIN A1C  01/30/2018  . OPHTHALMOLOGY EXAM  05/23/2018  . TETANUS/TDAP  05/18/2024  . PNA vac Low Risk Adult  Completed    Lab Results  Component Value Date   WBC 6.6 07/30/2017   HGB 15.7 07/30/2017   HCT 47.1 07/30/2017   PLT 174.0 07/30/2017   GLUCOSE 117 (H) 07/30/2017   CHOL 109 07/30/2017   TRIG 89.0 07/30/2017   HDL 36.40 (L) 07/30/2017   LDLDIRECT 70.8 03/14/2014   LDLCALC 54 07/30/2017   ALT 21 07/30/2017   AST 16 07/30/2017   NA 140 07/30/2017   K 4.2 07/30/2017   CL 104 07/30/2017   CREATININE 1.53 (H) 07/30/2017   BUN 28 (H) 07/30/2017   CO2 22 07/30/2017   TSH 3.92 07/30/2017   PSA 1.18 10/07/2012   INR 1.0 08/15/2016   HGBA1C 7.4 (H) 07/30/2017   MICROALBUR 26.7 (H) 07/15/2016    Lab Results  Component Value Date   TSH 3.92 07/30/2017   Lab Results  Component Value Date   WBC 6.6 07/30/2017   HGB 15.7 07/30/2017   HCT 47.1 07/30/2017   MCV 83.6 07/30/2017   PLT 174.0 07/30/2017   Lab Results  Component Value Date   NA 140 07/30/2017   K 4.2 07/30/2017   CO2 22 07/30/2017   GLUCOSE 117 (H) 07/30/2017   BUN 28 (H) 07/30/2017   CREATININE  1.53 (H) 07/30/2017   BILITOT 0.6 07/30/2017   ALKPHOS 44 07/30/2017   AST 16 07/30/2017   ALT 21 07/30/2017   PROT 6.6 07/30/2017   ALBUMIN 3.7 07/30/2017   CALCIUM 10.0 07/30/2017   ANIONGAP 8 07/10/2016   GFR 46.54 (L)  07/30/2017   Lab Results  Component Value Date   CHOL 109 07/30/2017   Lab Results  Component Value Date   HDL 36.40 (L) 07/30/2017   Lab Results  Component Value Date   LDLCALC 54 07/30/2017   Lab Results  Component Value Date   TRIG 89.0 07/30/2017   Lab Results  Component Value Date   CHOLHDL 3 07/30/2017   Lab Results  Component Value Date   HGBA1C 7.4 (H) 07/30/2017         Assessment & Plan:   Problem List Items Addressed This Visit    Hyperlipidemia (Chronic)    Encouraged heart healthy diet, increase exercise, avoid trans fats, consider a krill oil cap daily      Relevant Orders   Lipid panel   Essential hypertension    Well controlled, no changes to meds. Encouraged heart healthy diet such as the DASH diet and exercise as tolerated.       Relevant Orders   CBC   Comprehensive metabolic panel   TSH   Diabetes mellitus type 2 in obese (HCC)    hgba1c acceptable, minimize simple carbs. Increase exercise as tolerated.       Relevant Orders   Hemoglobin A1c   Vitamin D deficiency    Take a supplement       Relevant Orders   Vitamin D (25 hydroxy)      I am having Vincent Allen" maintain his calcium carbonate, glucose blood, onetouch ultrasoft, losartan, furosemide, triamterene-hydrochlorothiazide, metFORMIN, atorvastatin, ELIQUIS, amLODipine, glipiZIDE, and JANUVIA.  No orders of the defined types were placed in this encounter.   CMA served as Education administrator during this visit. History, Physical and Plan performed by medical provider. Documentation and orders reviewed and attested to.  [name], MD

## 2017-08-05 NOTE — Assessment & Plan Note (Addendum)
Take a supplement

## 2017-08-30 ENCOUNTER — Other Ambulatory Visit: Payer: Self-pay | Admitting: Family Medicine

## 2017-08-30 DIAGNOSIS — I4819 Other persistent atrial fibrillation: Secondary | ICD-10-CM

## 2017-09-10 ENCOUNTER — Ambulatory Visit: Payer: PPO | Admitting: Podiatry

## 2017-09-10 ENCOUNTER — Encounter: Payer: Self-pay | Admitting: Podiatry

## 2017-09-10 DIAGNOSIS — B351 Tinea unguium: Secondary | ICD-10-CM | POA: Diagnosis not present

## 2017-09-10 DIAGNOSIS — M779 Enthesopathy, unspecified: Secondary | ICD-10-CM | POA: Diagnosis not present

## 2017-09-10 DIAGNOSIS — M79675 Pain in left toe(s): Secondary | ICD-10-CM | POA: Diagnosis not present

## 2017-09-10 DIAGNOSIS — M79674 Pain in right toe(s): Secondary | ICD-10-CM

## 2017-09-11 ENCOUNTER — Other Ambulatory Visit: Payer: Self-pay | Admitting: Family Medicine

## 2017-09-11 NOTE — Progress Notes (Signed)
Subjective:   Patient ID: Vincent Allen, male   DOB: 84 y.o.   MRN: [id number]   HPI Patient presents with painful nailbeds 1-5 both feet that he cannot cut and make it difficult to wear shoe gear comfortably   ROS      Objective:  Physical Exam  Neurovascular status unchanged with thick yellow brittle nailbeds 1-5 both feet that are painful     Assessment:  Mycotic nail infections with pain 1-5 both feet     Plan:  Debride painful nailbeds 1-5 both feet with no iatrogenic bleeding noted

## 2017-09-17 DIAGNOSIS — Z85828 Personal history of other malignant neoplasm of skin: Secondary | ICD-10-CM | POA: Diagnosis not present

## 2017-09-17 DIAGNOSIS — D485 Neoplasm of uncertain behavior of skin: Secondary | ICD-10-CM | POA: Diagnosis not present

## 2017-09-17 DIAGNOSIS — L821 Other seborrheic keratosis: Secondary | ICD-10-CM | POA: Diagnosis not present

## 2017-09-17 DIAGNOSIS — L570 Actinic keratosis: Secondary | ICD-10-CM | POA: Diagnosis not present

## 2017-09-17 DIAGNOSIS — D044 Carcinoma in situ of skin of scalp and neck: Secondary | ICD-10-CM | POA: Diagnosis not present

## 2017-09-17 DIAGNOSIS — D692 Other nonthrombocytopenic purpura: Secondary | ICD-10-CM | POA: Diagnosis not present

## 2017-09-23 ENCOUNTER — Other Ambulatory Visit: Payer: Self-pay | Admitting: Internal Medicine

## 2017-09-23 ENCOUNTER — Other Ambulatory Visit: Payer: Self-pay | Admitting: Family Medicine

## 2017-09-23 NOTE — Telephone Encounter (Signed)
Rx sent to pharmacy   

## 2017-10-01 ENCOUNTER — Ambulatory Visit (INDEPENDENT_AMBULATORY_CARE_PROVIDER_SITE_OTHER): Payer: PPO | Admitting: *Deleted

## 2017-10-01 DIAGNOSIS — I495 Sick sinus syndrome: Secondary | ICD-10-CM | POA: Diagnosis not present

## 2017-10-01 NOTE — Progress Notes (Signed)
Remote pacemaker transmission.   

## 2017-10-02 ENCOUNTER — Encounter: Payer: Self-pay | Admitting: Cardiology

## 2017-10-06 ENCOUNTER — Encounter: Payer: PPO | Admitting: Physician Assistant

## 2017-10-09 LAB — CUP PACEART REMOTE DEVICE CHECK
Battery Remaining Longevity: 143 mo
Battery Voltage: 3.04 V
Brady Statistic AP VP Percent: 0 %
Brady Statistic AP VS Percent: 0 %
Brady Statistic AS VS Percent: 0 %
Brady Statistic RA Percent Paced: 0.14 %
Implantable Lead Implant Date: 20180706
Implantable Lead Implant Date: 20180706
Implantable Lead Location: 753859
Implantable Lead Location: 753860
Implantable Pulse Generator Implant Date: 20180706
Lead Channel Impedance Value: 323 Ohm
Lead Channel Impedance Value: 589 Ohm
Lead Channel Pacing Threshold Amplitude: 0.75 V
Lead Channel Pacing Threshold Pulse Width: 0.4 ms
Lead Channel Sensing Intrinsic Amplitude: 1.625 mV
Lead Channel Sensing Intrinsic Amplitude: 1.625 mV
Lead Channel Sensing Intrinsic Amplitude: 12 mV
Lead Channel Sensing Intrinsic Amplitude: 12 mV
Lead Channel Setting Pacing Amplitude: 2 V
Lead Channel Setting Pacing Pulse Width: 0.4 ms
MDC IDC MSMT LEADCHNL RA IMPEDANCE VALUE: 589 Ohm
MDC IDC MSMT LEADCHNL RV IMPEDANCE VALUE: 741 Ohm
MDC IDC SESS DTM: 20190717112841
MDC IDC SET LEADCHNL RV PACING AMPLITUDE: 2.5 V
MDC IDC SET LEADCHNL RV SENSING SENSITIVITY: 2.8 mV
MDC IDC STAT BRADY AS VP PERCENT: 0 %
MDC IDC STAT BRADY RV PERCENT PACED: 99.46 %

## 2017-11-02 NOTE — Progress Notes (Signed)
Cardiology Office Note Date:  11/04/2017  Patient ID:  Pablo, Stauffer 12/07/1933, MRN [id number] PCP:  [name], MD  Cardiologist:  Dr. Debara Pickett Electrophysiologist: Dr. Caryl Comes   Chief Complaint: routine EP visit  History of Present Illness: ARMARI FUSSELL is a 84 y.o. male with history of PAFib, symptomatic bradycardia w/PPM, HTN, HLD, chronic CHF (diastolic), LBBB.  He comes in today to be seen for Dr. Caryl Comes.  Last seen by him in Oct 2018 for his post PPM visit.   At that time he was feeling markedly improved with pacing, he was 100% AFib and given such improved symptoms, not to purse rhythm control strategy.   Most recently he saw Dr. Debara Pickett in feb of this year, he was doing well, better since his PPM implant, no changes were made his regime.  He tells me his is doing well, takes care of himself, his home, lawn, no difficulties with his ADL's, though in the last 6-8 months after lunch he "poops out" and feels particularly taired and doesn't do much afterwards.  This predate shis most recent labs with stable H/H.  More recently he mentions very random, and fleeting (a second at most) where he says it feels like the room turns upside down, he closes his eyes and it's over.  These are not associated with position change, head movements.  No new medicines or recent illnesses.  He denies any cardiac awarreness with these or ever.  No palpitations, no CP or SOB, no DOE, no symptoms of PND or orthopnea.  He does not feel like he is fainting, no near syncope or syncope.   Device information: MDT dual chamber PPM, implanted 09/20/16   Past Medical History:  Diagnosis Date  . Arthritis   . Bradycardia   . Cataract   . Chronic kidney disease stage III (GFR 30-59 ml/min) 05/03/2012  . Colon polyps   . Complication of anesthesia    emesis  . Diabetes mellitus   . GERD (gastroesophageal reflux disease)   . HOH (hard of hearing)   . HTN (hypertension) 01/13/2013  . Hyperlipidemia     . Hypertension   . Pacemaker 12/2016  . Persistent atrial fibrillation ([place])   . Presence of permanent cardiac pacemaker 09/20/2016  . Symptomatic bradycardia   . Vitamin D deficiency 07/17/2015    Past Surgical History:  Procedure Laterality Date  . ANKLE ARTHROPLASTY    . CARDIOVERSION N/A 08/20/2016   Procedure: CARDIOVERSION;  Surgeon: Sanda Klein, MD;  Location: [facility];  Service: Cardiovascular;  Laterality: N/A;  . CATARACT EXTRACTION  2010, 2014  . CATARACT EXTRACTION  02/28/14  . CHOLECYSTECTOMY    . CHOLECYSTECTOMY N/A 05/03/2012   Procedure: LAPAROSCOPIC CHOLECYSTECTOMY WITH INTRAOPERATIVE CHOLANGIOGRAM;  Surgeon: [name], MD;  Location: [facility];  Service: General;  Laterality: N/A;  . INSERT / REPLACE / REMOVE PACEMAKER  09/20/2016  . PACEMAKER IMPLANT N/A 09/20/2016   Procedure: Pacemaker Implant;  Surgeon: Deboraha Sprang, MD;  Location: [facility];  Service: Cardiovascular;  Laterality: N/A;  . ROTATOR CUFF REPAIR    . SHOULDER SURGERY    . TONSILLECTOMY      Current Outpatient Medications  Medication Sig Dispense Refill  . amLODipine (NORVASC) 5 MG tablet TAKE 1 TABLET BY MOUTH EVERY DAY 90 tablet 1  . atorvastatin (LIPITOR) 20 MG tablet TAKE 1 TABLET BY MOUTH DAILY 90 tablet 1  . Calcium 600 MG tablet Take 1 tablet (600 mg total)  by mouth 2 (two) times daily. 60 tablet 0  . ELIQUIS 2.5 MG TABS tablet TAKE 1 TABLET (2.5 MG TOTAL) BY MOUTH 2 (TWO) TIMES DAILY. 60 tablet 2  . furosemide (LASIX) 20 MG tablet Take 1 tablet (20 mg total) by mouth daily as needed for edema (weight gain >3lbs in 24 hrs, SOB). 30 tablet 2  . glipiZIDE (GLUCOTROL) 10 MG tablet TAKE 2 TABLETS BY MOUTH TWICE DAILY BEFORE MEALS 360 tablet 0  . glucose blood (ONE TOUCH ULTRA TEST) test strip Use as twice daily to check blood sugar.  DX E11.9 100 each 6  . JANUVIA 100 MG tablet TAKE 1/2 TO 1 TABLET DAILY 30 tablet 4  . Lancets (ONETOUCH ULTRASOFT) lancets Use as instructed to  check blood sugar twice a day as needed Dx Code E11.9 100 each 6  . losartan (COZAAR) 50 MG tablet TAKE 1 TABLET BY MOUTH EVERY DAY 90 tablet 2  . metFORMIN (GLUCOPHAGE) 500 MG tablet Take by mouth daily.    Marland Kitchen triamterene-hydrochlorothiazide (MAXZIDE) 75-50 MG tablet TAKE 1 TABLET BY MOUTH DAILY. 90 tablet 1  . triamterene-hydrochlorothiazide (MAXZIDE-25) 37.5-25 MG tablet Take 1 tablet daily by mouth. (Patient taking differently: Take 1 tablet by mouth daily. 1/2 TABLET DAILY) 90 tablet 1   No current facility-administered medications for this visit.     Allergies:   Penicillins; Verapamil; and Influenza vaccines   Social History:  The patient  reports that he has never smoked. He has never used smokeless tobacco. He reports that he drinks about 1.0 standard drinks of alcohol per week. He reports that he does not use drugs.   Family History:  The patient's family history includes Cancer in his mother and paternal grandmother; Diabetes in his father; Heart disease in his father and son; Obesity in his son; Stroke in his father.  ROS:  Please see the history of present illness.  All other systems are reviewed and otherwise negative.   PHYSICAL EXAM:  VS:  BP 134/72   Pulse 82   Ht 6' (1.829 m)   Wt 222 lb (100.7 kg)   BMI 30.11 kg/m  BMI: Body mass index is 30.11 kg/m. Well nourished, well developed, in no acute distress  HEENT: normocephalic, atraumatic  Neck: no JVD, carotid bruits or masses Cardiac:  RRR (paced); no significant murmurs, no rubs, or gallops Lungs:  CTA b/l, no wheezing, rhonchi or rales  Abd: soft, nontender MS: no deformity, age appropriate atrophy Ext: trace edema  Skin: warm and dry, no rash Neuro:  No gross deficits appreciated Psych: euthymic mood, full affect  PPM site is stable, no tethering or discomfort   EKG:  Not done today PPM interrogation done today and reviewed by myself: battery and lead measurements stable, AF since implant, VP  99.3%  07/10/16: TTE Study Conclusions - Left ventricle: The cavity size was normal. Wall thickness was   normal. The estimated ejection fraction was 55%. Indeterminant   diastolic function (atrial fibrillation). Although no diagnostic   regional wall motion abnormality was identified, this possibility   cannot be completely excluded on the basis of this study. - Aortic valve: There was no stenosis. - Mitral valve: Mildly calcified annulus. Mildly calcified leaflets   . There was mild regurgitation. - Left atrium: The atrium was moderately dilated. - Right ventricle: The cavity size was mildly dilated. Systolic   function was normal. - Tricuspid valve: Peak RV-RA gradient (S): 46 mm Hg. - Pulmonary arteries: PA peak pressure: 54  mm Hg (S). - Systemic veins: IVC measured 2.1 cm with > 50% respirophasic   variation, suggesting RA pressure 8 mmHg. Impressions: - The patient was in atrial fibrillation. Normal LV size with EF   55%. Mildly dilated RV with normal systolic function. Moderate   pulmonary hypertension. Mild MR    Recent Labs: 07/30/2017: ALT 21; BUN 28; Creatinine, Ser 1.53; Hemoglobin 15.7; Platelets 174.0; Potassium 4.2; Sodium 140; TSH 3.92  07/30/2017: Cholesterol 109; HDL 36.40; LDL Cholesterol 54; Total CHOL/HDL Ratio 3; Triglycerides 89.0; VLDL 17.8   CrCl cannot be calculated (Patient's most recent lab result is older than the maximum 21 days allowed.).   Wt Readings from Last 3 Encounters:  11/04/17 222 lb (100.7 kg)  08/05/17 223 lb (101.2 kg)  05/06/17 224 lb 3.2 oz (101.7 kg)     Other studies reviewed: Additional studies/records reviewed today include: summarized above  ASSESSMENT AND PLAN:  1. PPM     Intact function, no changes made, underlying rate 40's  2. Persistant  >>  permanent     CHA2DS2Vasc is 3, on Eliquis     Creat seems to avg 1.5-1.7 with age, appropriately on low dose eliquis, reports compliance  3. HTN     Looks good, no  changes  4. Dizzy spells, sound of vertgo type symptoms     Negative orthostatics here     He has labs scheduled for this Thursday via his PMD and sees them next week     He is instructed to call there today to discuss his symptom and any additional labs, imaging/neuro that she may want to do  5. Chronic CHF (diastolic)      Weight is stable (down a couple pounds), no symptoms or exam findings to suggest fluid OL      Disposition: F/u with q 3 mo remotes, and 1 year in-clinic EP/device visit, sooner if needed  Current medicines are reviewed at length with the patient today.  The patient did not have any concerns regarding medicines.  Venetia Night, PA-C 11/04/2017 10:05 AM     [address] [place] [address] [phone] (office)  [phone] (fax)

## 2017-11-04 ENCOUNTER — Ambulatory Visit: Payer: PPO | Admitting: Physician Assistant

## 2017-11-04 VITALS — BP 134/72 | HR 82 | Ht 72.0 in | Wt 222.0 lb

## 2017-11-04 DIAGNOSIS — I10 Essential (primary) hypertension: Secondary | ICD-10-CM

## 2017-11-04 DIAGNOSIS — I482 Chronic atrial fibrillation: Secondary | ICD-10-CM | POA: Diagnosis not present

## 2017-11-04 DIAGNOSIS — I4821 Permanent atrial fibrillation: Secondary | ICD-10-CM

## 2017-11-04 DIAGNOSIS — I5032 Chronic diastolic (congestive) heart failure: Secondary | ICD-10-CM

## 2017-11-04 DIAGNOSIS — Z950 Presence of cardiac pacemaker: Secondary | ICD-10-CM

## 2017-11-04 DIAGNOSIS — R42 Dizziness and giddiness: Secondary | ICD-10-CM

## 2017-11-04 NOTE — Patient Instructions (Addendum)
Medication Instructions:   Your physician recommends that you continue on your current medications as directed. Please refer to the Current Medication list given to you today.    If you need a refill on your cardiac medications before your next appointment, please call your pharmacy.  Labwork: NONE ORDERED  TODAY    Testing/Procedures: NONE ORDERED  TODAY    Follow-Up:  Your physician wants you to follow-up in: [place] will receive a reminder letter in the mail two months in advance. If you don't receive a letter, please call our office to schedule the follow-up appointment.   Remote monitoring is used to monitor your Pacemaker of ICD from home. This monitoring reduces the number of office visits required to check your device to one time per year. It allows [place] to keep an eye on the functioning of your device to ensure it is working properly. You are scheduled for a device check from home on . 10-16-19You may send your transmission at any time that day. If you have a wireless device, the transmission will be sent automatically. After your physician reviews your transmission, you will receive a postcard with your next transmission date.     Any Other Special Instructions Will Be Listed Below (If Applicable).  MAKE SURE YOU FOLLOW UP WITH DR [name] ABOUT DIZZY SPELLS

## 2017-11-06 ENCOUNTER — Other Ambulatory Visit (INDEPENDENT_AMBULATORY_CARE_PROVIDER_SITE_OTHER): Payer: PPO

## 2017-11-06 DIAGNOSIS — E559 Vitamin D deficiency, unspecified: Secondary | ICD-10-CM

## 2017-11-06 DIAGNOSIS — I10 Essential (primary) hypertension: Secondary | ICD-10-CM | POA: Diagnosis not present

## 2017-11-06 DIAGNOSIS — E669 Obesity, unspecified: Secondary | ICD-10-CM | POA: Diagnosis not present

## 2017-11-06 DIAGNOSIS — E1169 Type 2 diabetes mellitus with other specified complication: Secondary | ICD-10-CM | POA: Diagnosis not present

## 2017-11-06 DIAGNOSIS — E785 Hyperlipidemia, unspecified: Secondary | ICD-10-CM

## 2017-11-06 LAB — COMPREHENSIVE METABOLIC PANEL
ALT: 17 U/L (ref 0–53)
AST: 14 U/L (ref 0–37)
Albumin: 3.8 g/dL (ref 3.5–5.2)
Alkaline Phosphatase: 46 U/L (ref 39–117)
BILIRUBIN TOTAL: 0.7 mg/dL (ref 0.2–1.2)
BUN: 41 mg/dL — ABNORMAL HIGH (ref 6–23)
CALCIUM: 10 mg/dL (ref 8.4–10.5)
CHLORIDE: 105 meq/L (ref 96–112)
CO2: 23 meq/L (ref 19–32)
Creatinine, Ser: 1.81 mg/dL — ABNORMAL HIGH (ref 0.40–1.50)
GFR: 38.31 mL/min — AB (ref >60.00)
GLUCOSE: 147 mg/dL — AB (ref 70–99)
Potassium: 3.9 mEq/L (ref 3.5–5.1)
Sodium: 137 mEq/L (ref 135–145)
Total Protein: 7 g/dL (ref 6.0–8.3)

## 2017-11-06 LAB — CBC
HCT: 48.3 % (ref 39.0–52.0)
HEMOGLOBIN: 15.6 g/dL (ref 13.0–17.0)
MCHC: 32.3 g/dL (ref 30.0–36.0)
MCV: 85.4 fl (ref 78.0–100.0)
PLATELETS: 168 10*3/uL (ref 150.0–400.0)
RBC: 5.65 Mil/uL (ref 4.22–5.81)
RDW: 13.9 % (ref 11.5–15.5)
WBC: 7.6 10*3/uL (ref 4.0–10.5)

## 2017-11-06 LAB — LIPID PANEL
CHOL/HDL RATIO: 3
Cholesterol: 99 mg/dL (ref 0–200)
HDL: 35.6 mg/dL — AB (ref >39.00)
LDL CALC: 47 mg/dL (ref 0–99)
NONHDL: 63.39
TRIGLYCERIDES: 80 mg/dL (ref 0.0–149.0)
VLDL: 16 mg/dL (ref 0.0–40.0)

## 2017-11-06 LAB — VITAMIN D 25 HYDROXY (VIT D DEFICIENCY, FRACTURES): VITD: 68.47 ng/mL (ref 30.00–100.00)

## 2017-11-06 LAB — TSH: TSH: 3.09 u[IU]/mL (ref 0.35–4.50)

## 2017-11-06 LAB — HEMOGLOBIN A1C: Hgb A1c MFr Bld: 7.7 % — ABNORMAL HIGH (ref 4.6–6.5)

## 2017-11-11 ENCOUNTER — Ambulatory Visit (INDEPENDENT_AMBULATORY_CARE_PROVIDER_SITE_OTHER): Payer: PPO | Admitting: Family Medicine

## 2017-11-11 VITALS — BP 132/70 | HR 83 | Temp 97.9°F | Resp 18 | Wt 220.4 lb

## 2017-11-11 DIAGNOSIS — E119 Type 2 diabetes mellitus without complications: Secondary | ICD-10-CM

## 2017-11-11 DIAGNOSIS — N183 Chronic kidney disease, stage 3 unspecified: Secondary | ICD-10-CM

## 2017-11-11 DIAGNOSIS — E559 Vitamin D deficiency, unspecified: Secondary | ICD-10-CM

## 2017-11-11 DIAGNOSIS — E669 Obesity, unspecified: Secondary | ICD-10-CM | POA: Diagnosis not present

## 2017-11-11 DIAGNOSIS — R350 Frequency of micturition: Secondary | ICD-10-CM | POA: Diagnosis not present

## 2017-11-11 DIAGNOSIS — I119 Hypertensive heart disease without heart failure: Secondary | ICD-10-CM

## 2017-11-11 DIAGNOSIS — R42 Dizziness and giddiness: Secondary | ICD-10-CM

## 2017-11-11 DIAGNOSIS — I10 Essential (primary) hypertension: Secondary | ICD-10-CM | POA: Diagnosis not present

## 2017-11-11 DIAGNOSIS — M545 Low back pain: Secondary | ICD-10-CM | POA: Diagnosis not present

## 2017-11-11 DIAGNOSIS — E1169 Type 2 diabetes mellitus with other specified complication: Secondary | ICD-10-CM | POA: Diagnosis not present

## 2017-11-11 DIAGNOSIS — M791 Myalgia, unspecified site: Secondary | ICD-10-CM

## 2017-11-11 LAB — COMPREHENSIVE METABOLIC PANEL
ALT: 17 U/L (ref 0–53)
AST: 14 U/L (ref 0–37)
Albumin: 4.1 g/dL (ref 3.5–5.2)
Alkaline Phosphatase: 52 U/L (ref 39–117)
BILIRUBIN TOTAL: 0.8 mg/dL (ref 0.2–1.2)
BUN: 40 mg/dL — AB (ref 6–23)
CO2: 23 meq/L (ref 19–32)
CREATININE: 1.61 mg/dL — AB (ref 0.40–1.50)
Calcium: 10.6 mg/dL — ABNORMAL HIGH (ref 8.4–10.5)
Chloride: 102 mEq/L (ref 96–112)
GFR: 43.85 mL/min — AB (ref >60.00)
GLUCOSE: 114 mg/dL — AB (ref 70–99)
Potassium: 4.3 mEq/L (ref 3.5–5.1)
SODIUM: 137 meq/L (ref 135–145)
TOTAL PROTEIN: 7.1 g/dL (ref 6.0–8.3)

## 2017-11-11 LAB — CK: CK TOTAL: 66 U/L (ref 7–232)

## 2017-11-11 MED ORDER — MECLIZINE HCL 25 MG PO TABS: 25.0000 mg | ORAL_TABLET | Freq: Three times a day (TID) | ORAL | 0 refills | Status: DC | PRN

## 2017-11-11 NOTE — Patient Instructions (Signed)
Stop the Lipitor for a month and see if the pain and weakness gets better. Let us know  Hydrate better for vertigo and let us know if it does not get better let us know so we can send you for special physical therapy for vertigo   Vertigo Vertigo is the feeling that you or your surroundings are moving when they are not. Vertigo can be dangerous if it occurs while you are doing something that could endanger you or others, such as driving. What are the causes? This condition is caused by a disturbance in the signals that are sent by your body's sensory systems to your brain. Different causes of a disturbance can lead to vertigo, including:  Infections, especially in the inner ear.  A bad reaction to a drug, or misuse of alcohol and medicines.  Withdrawal from drugs or alcohol.  Quickly changing positions, as when lying down or rolling over in bed.  Migraine headaches.  Decreased blood flow to the brain.  Decreased blood pressure.  Increased pressure in the brain from a head or neck injury, stroke, infection, tumor, or bleeding.  Central nervous system disorders.  What are the signs or symptoms? Symptoms of this condition usually occur when you move your head or your eyes in different directions. Symptoms may start suddenly, and they usually last for less than a minute. Symptoms may include:  Loss of balance and falling.  Feeling like you are spinning or moving.  Feeling like your surroundings are spinning or moving.  Nausea and vomiting.  Blurred vision or double vision.  Difficulty hearing.  Slurred speech.  Dizziness.  Involuntary eye movement (nystagmus).  Symptoms can be mild and cause only slight annoyance, or they can be severe and interfere with daily life. Episodes of vertigo may return (recur) over time, and they are often triggered by certain movements. Symptoms may improve over time. How is this diagnosed? This condition may be diagnosed based on medical  history and the quality of your nystagmus. Your health care provider may test your eye movements by asking you to quickly change positions to trigger the nystagmus. This may be called the Dix-Hallpike test, head thrust test, or roll test. You may be referred to a health care provider who specializes in ear, nose, and throat (ENT) problems (otolaryngologist) or a provider who specializes in disorders of the central nervous system (neurologist). You may have additional testing, including:  A physical exam.  Blood tests.  MRI.  A CT scan.  An electrocardiogram (ECG). This records electrical activity in your heart.  An electroencephalogram (EEG). This records electrical activity in your brain.  Hearing tests.  How is this treated? Treatment for this condition depends on the cause and the severity of the symptoms. Treatment options include:  Medicines to treat nausea or vertigo. These are usually used for severe cases. Some medicines that are used to treat other conditions may also reduce or eliminate vertigo symptoms. These include: ? Medicines that control allergies (antihistamines). ? Medicines that control seizures (anticonvulsants). ? Medicines that relieve depression (antidepressants). ? Medicines that relieve anxiety (sedatives).  Head movements to adjust your inner ear back to normal. If your vertigo is caused by an ear problem, your health care provider may recommend certain movements to correct the problem.  Surgery. This is rare.  Follow these instructions at home: Safety  Move slowly.Avoid sudden body or head movements.  Avoid driving.  Avoid operating heavy machinery.  Avoid doing any tasks that would cause danger to  you or others if you would have a vertigo episode during the task.  If you have trouble walking or keeping your balance, try using a cane for stability. If you feel dizzy or unstable, sit down right away.  Return to your normal activities as told by  your health care provider. Ask your health care provider what activities are safe for you. General instructions  Take over-the-counter and prescription medicines only as told by your health care provider.  Avoid certain positions or movements as told by your health care provider.  Drink enough fluid to keep your urine clear or pale yellow.  Keep all follow-up visits as told by your health care provider. This is important. Contact a health care provider if:  Your medicines do not relieve your vertigo or they make it worse.  You have a fever.  Your condition gets worse or you develop new symptoms.  Your family or friends notice any behavioral changes.  Your nausea or vomiting gets worse.  You have numbness or a "pins and needles" sensation in part of your body. Get help right away if:  You have difficulty moving or speaking.  You are always dizzy.  You faint.  You develop severe headaches.  You have weakness in your hands, arms, or legs.  You have changes in your hearing or vision.  You develop a stiff neck.  You develop sensitivity to light. This information is not intended to replace advice given to you by your health care provider. Make sure you discuss any questions you have with your health care provider. Document Released: 12/12/2004 Document Revised: 08/16/2015 Document Reviewed: 06/27/2014 Elsevier Interactive Patient Education  2018 [employer]. Cholesterol Cholesterol is a white, waxy, fat-like substance that is needed by the human body in small amounts. The liver makes all the cholesterol we need. Cholesterol is carried from the liver by the blood through the blood vessels. Deposits of cholesterol (plaques) may build up on blood vessel (artery) walls. Plaques make the arteries narrower and stiffer. Cholesterol plaques increase the risk for heart attack and stroke. You cannot feel your cholesterol level even if it is very high. The only way to know that it is  high is to have a blood test. Once you know your cholesterol levels, you should keep a record of the test results. Work with your health care provider to keep your levels in the desired range. What do the results mean?  Total cholesterol is a rough measure of all the cholesterol in your blood.  LDL (low-density lipoprotein) is the "bad" cholesterol. This is the type that causes plaque to build up on the artery walls. You want this level to be low.  HDL (high-density lipoprotein) is the "good" cholesterol because it cleans the arteries and carries the LDL away. You want this level to be high.  Triglycerides are fat that the body can either burn for energy or store. High levels are closely linked to heart disease. What are the desired levels of cholesterol?  Total cholesterol below 200.  LDL below 100 for people who are at risk, below 70 for people at very high risk.  HDL above 40 is good. A level of 60 or higher is considered to be protective against heart disease.  Triglycerides below 150. How can I lower my cholesterol? Diet Follow your diet program as told by your health care provider.  Choose fish or white meat chicken and [place], roasted or baked. Limit fatty cuts of red meat, fried foods, and  processed meats, such as sausage and lunch meats.  Eat lots of fresh fruits and vegetables.  Choose whole grains, beans, pasta, potatoes, and cereals.  Choose olive oil, corn oil, or canola oil, and use only small amounts.  Avoid butter, mayonnaise, shortening, or palm kernel oils.  Avoid foods with trans fats.  Drink skim or nonfat milk and eat low-fat or nonfat yogurt and cheeses. Avoid whole milk, cream, ice cream, egg yolks, and full-fat cheeses.  Healthier desserts include angel food cake, ginger snaps, animal crackers, hard candy, popsicles, and low-fat or nonfat frozen yogurt. Avoid pastries, cakes, pies, and cookies.  Exercise  Follow your exercise program as told by your  health care provider. A regular program: ? Helps to decrease LDL and raise HDL. ? Helps with weight control.  Do things that increase your activity level, such as gardening, walking, and taking the stairs.  Ask your health care provider about ways that you can be more active in your daily life.  Medicine  Take over-the-counter and prescription medicines only as told by your health care provider. ? Medicine may be prescribed by your health care provider to help lower cholesterol and decrease the risk for heart disease. This is usually done if diet and exercise have failed to bring down cholesterol levels. ? If you have several risk factors, you may need medicine even if your levels are normal.  This information is not intended to replace advice given to you by your health care provider. Make sure you discuss any questions you have with your health care provider. Document Released: 11/27/2000 Document Revised: 09/30/2015 Document Reviewed: 09/02/2015 Elsevier Interactive Patient Education  [employer].

## 2017-11-11 NOTE — Progress Notes (Signed)
Subjective:  I acted as a Education administrator for Dr. [name]. Vincent Allen, Vincent Allen  Patient ID: Vincent Allen, male    DOB: [birth year], 84 y.o.   MRN: [id number]  No chief complaint on file.   HPI  Patient is in today to discuss lab work and noting significant low back pain. No recent fall or trauma but pain in back is bad enou to make him feel weak in his legs. He also has had some general fatigue and some episodes of vertigo off and on for about a month and a  Half. He noes the room spins at times but denies any headache, vision or hearing changes. He feels better from the spinning if he closes his eyes. Notes some dysphagia at times as well. No choking or change in bowel habits. Denies CP/palp/SOB/HA/congestion/fevers/GI or GU c/o. Taking meds as prescribed  Patient Care Team: [name], MD as PCP - General (Family Medicine) Clent Jacks, MD as Consulting Physician (Ophthalmology) Danella Sensing, MD as Consulting Physician (Dermatology) Dorothy Spark, MD as Consulting Physician (Cardiology) [name], MD as Consulting Physician (Orthopedic Surgery) Paulla Dolly Tamala Fothergill, DPM as Consulting Physician (Podiatry)   Past Medical History:  Diagnosis Date  . Arthritis   . Bradycardia   . Cataract   . Chronic kidney disease stage III (GFR 30-59 ml/min) 05/03/2012  . Colon polyps   . Complication of anesthesia    emesis  . Diabetes mellitus   . GERD (gastroesophageal reflux disease)   . HOH (hard of hearing)   . HTN (hypertension) 01/13/2013  . Hyperlipidemia   . Hypertension   . Pacemaker 12/2016  . Persistent atrial fibrillation ([place])   . Presence of permanent cardiac pacemaker 09/20/2016  . Symptomatic bradycardia   . Vitamin D deficiency 07/17/2015    Past Surgical History:  Procedure Laterality Date  . ANKLE ARTHROPLASTY    . CARDIOVERSION N/A 08/20/2016   Procedure: CARDIOVERSION;  Surgeon: Sanda Klein, MD;  Location: [facility];  Service: Cardiovascular;  Laterality:  N/A;  . CATARACT EXTRACTION  2010, 2014  . CATARACT EXTRACTION  02/28/14  . CHOLECYSTECTOMY    . CHOLECYSTECTOMY N/A 05/03/2012   Procedure: LAPAROSCOPIC CHOLECYSTECTOMY WITH INTRAOPERATIVE CHOLANGIOGRAM;  Surgeon: [name], MD;  Location: [facility];  Service: General;  Laterality: N/A;  . INSERT / REPLACE / REMOVE PACEMAKER  09/20/2016  . PACEMAKER IMPLANT N/A 09/20/2016   Procedure: Pacemaker Implant;  Surgeon: Deboraha Sprang, MD;  Location: [facility];  Service: Cardiovascular;  Laterality: N/A;  . ROTATOR CUFF REPAIR    . SHOULDER SURGERY    . TONSILLECTOMY      Family History  Problem Relation Age of Onset  . Cancer Mother        colon, breast, pancreas, skin cancer  . Heart disease Father        heart valve replaced  . Stroke Father   . Diabetes Father   . Cancer Paternal Grandmother        colon  . Obesity Son   . Heart disease Son        bradycardia    Social History   Socioeconomic History  . Marital status: Widowed    Spouse name: Not on file  . Number of children: Not on file  . Years of education: Not on file  . Highest education level: Not on file  Occupational History  . Not on file  Social Needs  . Financial resource strain: Not on file  .  Food insecurity:    Worry: Not on file    Inability: Not on file  . Transportation needs:    Medical: Not on file    Non-medical: Not on file  Tobacco Use  . Smoking status: Never Smoker  . Smokeless tobacco: Never Used  Substance and Sexual Activity  . Alcohol use: Yes    Alcohol/week: 1.0 standard drinks    Types: 1 Glasses of wine per week  . Drug use: No  . 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.  Lifestyle  . Physical activity:    Days per week: Not on file    Minutes per session: Not on file  . Stress: Not on file  Relationships  . Social connections:    Talks on phone: Not on file     Gets together: Not on file    Attends religious service: Not on file    Active member of club or organization: Not on file    Attends meetings of clubs or organizations: Not on file    Relationship status: Not on file  . Intimate partner violence:    Fear of current or ex partner: Not on file    Emotionally abused: Not on file    Physically abused: Not on file    Forced sexual activity: Not on file  Other Topics Concern  . Not on file  Social History Narrative  . Not on file    Outpatient Medications Prior to Visit  Medication Sig Dispense Refill  . amLODipine (NORVASC) 5 MG tablet TAKE 1 TABLET BY MOUTH EVERY DAY 90 tablet 1  . Calcium 600 MG tablet Take 1 tablet (600 mg total) by mouth 2 (two) times daily. 60 tablet 0  . ELIQUIS 2.5 MG TABS tablet TAKE 1 TABLET (2.5 MG TOTAL) BY MOUTH 2 (TWO) TIMES DAILY. 60 tablet 2  . furosemide (LASIX) 20 MG tablet Take 1 tablet (20 mg total) by mouth daily as needed for edema (weight gain >3lbs in 24 hrs, SOB). 30 tablet 2  . glipiZIDE (GLUCOTROL) 10 MG tablet TAKE 2 TABLETS BY MOUTH TWICE DAILY BEFORE MEALS 360 tablet 0  . glucose blood (ONE TOUCH ULTRA TEST) test strip Use as twice daily to check blood sugar.  DX E11.9 100 each 6  . JANUVIA 100 MG tablet TAKE 1/2 TO 1 TABLET DAILY 30 tablet 4  . Lancets (ONETOUCH ULTRASOFT) lancets Use as instructed to check blood sugar twice a day as needed Dx Code E11.9 100 each 6  . losartan (COZAAR) 50 MG tablet TAKE 1 TABLET BY MOUTH EVERY DAY 90 tablet 2  . metFORMIN (GLUCOPHAGE) 500 MG tablet Take by mouth daily.    Marland Kitchen triamterene-hydrochlorothiazide (MAXZIDE) 75-50 MG tablet TAKE 1 TABLET BY MOUTH DAILY. 90 tablet 1  . atorvastatin (LIPITOR) 20 MG tablet TAKE 1 TABLET BY MOUTH DAILY 90 tablet 1  . triamterene-hydrochlorothiazide (MAXZIDE-25) 37.5-25 MG tablet Take 1 tablet daily by mouth. (Patient taking differently: Take 1 tablet by mouth daily. 1/2 TABLET DAILY) 90 tablet 1   No facility-administered  medications prior to visit.     Allergies  Allergen Reactions  . Penicillins Hives and Itching    Has patient had a PCN reaction causing immediate rash, facial/tongue/throat swelling, SOB or lightheadedness with hypotension:Yes Has patient had a PCN reaction causing severe rash involving mucus membranes or skin necrosis:No Has patient had a  PCN reaction that required hospitalization:No Has patient had a PCN reaction occurring within the last 10 years:No If all of the above answers are "NO", then may proceed with Cephalosporin use.   . Verapamil     Junctional rhythm  . Influenza Vaccines Rash    ROS     Objective:    Physical Exam  BP 132/70 (BP Location: Left Arm, Patient Position: Sitting, Cuff Size: Normal)   Pulse 83   Temp 97.9 F (36.6 C) (Oral)   Resp 18   Wt 220 lb 6.4 oz (100 kg)   SpO2 97%   BMI 29.89 kg/m  Wt Readings from Last 3 Encounters:  11/11/17 220 lb 6.4 oz (100 kg)  11/04/17 222 lb (100.7 kg)  08/05/17 223 lb (101.2 kg)   BP Readings from Last 3 Encounters:  11/11/17 132/70  11/04/17 134/72  08/05/17 134/66     Immunization History  Administered Date(s) Administered  . Pneumococcal Conjugate-13 03/23/2015  . Pneumococcal Polysaccharide-23 06/27/2011  . Tdap 05/19/2014    Health Maintenance  Topic Date Due  . FOOT EXAM  07/16/2016  . INFLUENZA VACCINE  01/06/2018 (Originally 10/16/2017)  . HEMOGLOBIN A1C  05/09/2018  . OPHTHALMOLOGY EXAM  05/23/2018  . TETANUS/TDAP  05/18/2024  . PNA vac Low Risk Adult  Completed    Lab Results  Component Value Date   WBC 7.6 11/06/2017   HGB 15.6 11/06/2017   HCT 48.3 11/06/2017   PLT 168.0 11/06/2017   GLUCOSE 114 (H) 11/11/2017   CHOL 99 11/06/2017   TRIG 80.0 11/06/2017   HDL 35.60 (L) 11/06/2017   LDLDIRECT 70.8 03/14/2014   LDLCALC 47 11/06/2017   ALT 17 11/11/2017   AST 14 11/11/2017   NA 137 11/11/2017   K 4.3 11/11/2017   CL 102 11/11/2017   CREATININE 1.61 (H) 11/11/2017   BUN 40  (H) 11/11/2017   CO2 23 11/11/2017   TSH 3.09 11/06/2017   PSA 1.18 10/07/2012   INR 1.0 08/15/2016   HGBA1C 7.7 (H) 11/06/2017   MICROALBUR 26.7 (H) 07/15/2016    Lab Results  Component Value Date   TSH 3.09 11/06/2017   Lab Results  Component Value Date   WBC 7.6 11/06/2017   HGB 15.6 11/06/2017   HCT 48.3 11/06/2017   MCV 85.4 11/06/2017   PLT 168.0 11/06/2017   Lab Results  Component Value Date   NA 137 11/11/2017   K 4.3 11/11/2017   CO2 23 11/11/2017   GLUCOSE 114 (H) 11/11/2017   BUN 40 (H) 11/11/2017   CREATININE 1.61 (H) 11/11/2017   BILITOT 0.8 11/11/2017   ALKPHOS 52 11/11/2017   AST 14 11/11/2017   ALT 17 11/11/2017   PROT 7.1 11/11/2017   ALBUMIN 4.1 11/11/2017   CALCIUM 10.6 (H) 11/11/2017   ANIONGAP 8 07/10/2016   GFR 43.85 (L) 11/11/2017   Lab Results  Component Value Date   CHOL 99 11/06/2017   Lab Results  Component Value Date   HDL 35.60 (L) 11/06/2017   Lab Results  Component Value Date   LDLCALC 47 11/06/2017   Lab Results  Component Value Date   TRIG 80.0 11/06/2017   Lab Results  Component Value Date   CHOLHDL 3 11/06/2017   Lab Results  Component Value Date   HGBA1C 7.7 (H) 11/06/2017         Assessment & Plan:   Problem List Items Addressed This Visit    Hypertensive heart disease (Chronic)   Relevant Orders  Comprehensive metabolic panel (Completed)   Chronic kidney disease stage III (GFR 30-59 ml/min) (Chronic)    Stable, maintain adequate hydation      Essential hypertension    Well controlled, no changes to meds. Encouraged heart healthy diet such as the DASH diet and exercise as tolerated.       Diabetes mellitus type 2 in obese (HCC)    hgba1c acceptable, minimize simple carbs. Increase exercise as tolerated.       Lumbago    Xray confirms severe degenerative changes. Consider further evaluation if pain worsens.       Relevant Orders   DG Lumbar Spine Complete (Completed)   Urinalysis, Routine w  reflex microscopic (Completed)   Urine Culture (Completed)   Vitamin D deficiency    Monitor and supplement      Urinary frequency    Negative urine sample       Other Visit Diagnoses    Myalgia    -  Primary   Relevant Orders   CK (Creatine Kinase) (Completed)   Vertigo          I have discontinued Stelios C. Herne "Bob"'s atorvastatin. I am also having him start on meclizine. Additionally, I am having him maintain his calcium carbonate, glucose blood, onetouch ultrasoft, furosemide, metFORMIN, amLODipine, JANUVIA, ELIQUIS, triamterene-hydrochlorothiazide, losartan, and glipiZIDE.  Meds ordered this encounter  Medications  . meclizine (ANTIVERT) 25 MG tablet    Sig: Take 1 tablet (25 mg total) by mouth 3 (three) times daily as needed for dizziness.    Dispense:  30 tablet    Refill:  0     [name], MD

## 2017-11-12 ENCOUNTER — Ambulatory Visit (HOSPITAL_BASED_OUTPATIENT_CLINIC_OR_DEPARTMENT_OTHER)
Admission: RE | Admit: 2017-11-12 | Discharge: 2017-11-12 | Disposition: A | Discharge status: Home / Self Care | Payer: PPO | Source: Ambulatory Visit | Attending: Family Medicine | Admitting: Family Medicine

## 2017-11-12 DIAGNOSIS — M419 Scoliosis, unspecified: Secondary | ICD-10-CM | POA: Diagnosis not present

## 2017-11-12 DIAGNOSIS — M545 Low back pain: Secondary | ICD-10-CM | POA: Diagnosis present

## 2017-11-12 DIAGNOSIS — M5136 Other intervertebral disc degeneration, lumbar region: Secondary | ICD-10-CM | POA: Diagnosis not present

## 2017-11-12 LAB — URINALYSIS, ROUTINE W REFLEX MICROSCOPIC
Bilirubin Urine: NEGATIVE
HGB URINE DIPSTICK: NEGATIVE
Ketones, ur: NEGATIVE
Leukocytes, UA: NEGATIVE
Nitrite: NEGATIVE
RBC / HPF: NONE SEEN (ref 0–?)
SPECIFIC GRAVITY, URINE: 1.015 (ref 1.000–1.030)
Total Protein, Urine: NEGATIVE
Urine Glucose: NEGATIVE
Urobilinogen, UA: 0.2 (ref 0.0–1.0)
pH: 7 (ref 5.0–8.0)

## 2017-11-13 ENCOUNTER — Other Ambulatory Visit: Payer: Self-pay | Admitting: Family Medicine

## 2017-11-13 ENCOUNTER — Encounter: Payer: Self-pay | Admitting: Family Medicine

## 2017-11-13 LAB — URINE CULTURE
MICRO NUMBER: 91023337
SPECIMEN QUALITY: ADEQUATE

## 2017-11-13 MED ORDER — NEOMYCIN-POLYMYXIN-HC 3.5-10000-1 OT SOLN: 4.0000 [drp] | Freq: Three times a day (TID) | OTIC | 0 refills | Status: DC

## 2017-11-17 NOTE — Assessment & Plan Note (Signed)
hgba1c acceptable, minimize simple carbs. Increase exercise as tolerated.  

## 2017-11-17 NOTE — Assessment & Plan Note (Signed)
Negative urine sample

## 2017-11-17 NOTE — Assessment & Plan Note (Signed)
Well controlled, no changes to meds. Encouraged heart healthy diet such as the DASH diet and exercise as tolerated.  °

## 2017-11-17 NOTE — Assessment & Plan Note (Signed)
Xray confirms severe degenerative changes. Consider further evaluation if pain worsens.

## 2017-11-17 NOTE — Assessment & Plan Note (Signed)
Monitor and supplement 

## 2017-11-17 NOTE — Assessment & Plan Note (Signed)
Stable, maintain adequate hydation

## 2017-11-18 ENCOUNTER — Other Ambulatory Visit: Payer: Self-pay | Admitting: Family Medicine

## 2017-11-21 ENCOUNTER — Other Ambulatory Visit: Payer: Self-pay | Admitting: Family Medicine

## 2017-11-30 ENCOUNTER — Other Ambulatory Visit: Payer: Self-pay | Admitting: Family Medicine

## 2017-11-30 DIAGNOSIS — I4819 Other persistent atrial fibrillation: Secondary | ICD-10-CM

## 2017-12-08 ENCOUNTER — Ambulatory Visit: Payer: Self-pay | Admitting: Podiatry

## 2017-12-08 DIAGNOSIS — B351 Tinea unguium: Secondary | ICD-10-CM | POA: Diagnosis not present

## 2017-12-08 DIAGNOSIS — M79674 Pain in right toe(s): Secondary | ICD-10-CM | POA: Diagnosis not present

## 2017-12-08 DIAGNOSIS — M79675 Pain in left toe(s): Secondary | ICD-10-CM | POA: Diagnosis not present

## 2017-12-08 DIAGNOSIS — M79604 Pain in right leg: Secondary | ICD-10-CM

## 2017-12-08 DIAGNOSIS — M79605 Pain in left leg: Secondary | ICD-10-CM

## 2017-12-09 NOTE — Progress Notes (Signed)
Subjective: 84 y.o. returns the office today for painful, elongated, thickened toenails which he cannot trim himself. Denies any redness or drainage around the nails. Denies any acute changes since last appointment and no new complaints today. Denies any systemic complaints such as fevers, chills, nausea, vomiting.   PCP: [name], MD   Objective: AAO 3, NAD DP/PT pulses palpable, CRT less than 3 seconds Nails hypertrophic, dystrophic, elongated, brittle, discolored 10. There is tenderness overlying the nails 1-5 bilaterally. There is no surrounding erythema or drainage along the nail sites. No open lesions or pre-ulcerative lesions are identified. No other areas of tenderness bilateral lower extremities. No overlying edema, erythema, increased warmth. No pain with calf compression, swelling, warmth, erythema.  Assessment: Patient presents with symptomatic onychomycosis  Plan: -Treatment options including alternatives, risks, complications were discussed -Nails sharply debrided 10 without complication/bleeding. -Discussed daily foot inspection. If there are any changes, to call the office immediately.  -Follow-up in 3 months or sooner if any problems are to arise. In the meantime, encouraged to call the office with any questions, concerns, changes symptoms.  Celesta Gentile, DPM

## 2017-12-16 ENCOUNTER — Ambulatory Visit (INDEPENDENT_AMBULATORY_CARE_PROVIDER_SITE_OTHER): Payer: PPO | Admitting: Family Medicine

## 2017-12-16 ENCOUNTER — Encounter: Payer: Self-pay | Admitting: Family Medicine

## 2017-12-16 VITALS — BP 118/72 | HR 88 | Temp 97.6°F | Resp 18 | Wt 219.6 lb

## 2017-12-16 DIAGNOSIS — I10 Essential (primary) hypertension: Secondary | ICD-10-CM

## 2017-12-16 DIAGNOSIS — G4700 Insomnia, unspecified: Secondary | ICD-10-CM

## 2017-12-16 DIAGNOSIS — I4819 Other persistent atrial fibrillation: Secondary | ICD-10-CM | POA: Diagnosis not present

## 2017-12-16 DIAGNOSIS — E1169 Type 2 diabetes mellitus with other specified complication: Secondary | ICD-10-CM

## 2017-12-16 DIAGNOSIS — I119 Hypertensive heart disease without heart failure: Secondary | ICD-10-CM

## 2017-12-16 DIAGNOSIS — E669 Obesity, unspecified: Secondary | ICD-10-CM

## 2017-12-16 DIAGNOSIS — N183 Chronic kidney disease, stage 3 unspecified: Secondary | ICD-10-CM

## 2017-12-16 LAB — COMPREHENSIVE METABOLIC PANEL
ALBUMIN: 3.9 g/dL (ref 3.5–5.2)
ALT: 20 U/L (ref 0–53)
AST: 14 U/L (ref 0–37)
Alkaline Phosphatase: 49 U/L (ref 39–117)
BUN: 38 mg/dL — ABNORMAL HIGH (ref 6–23)
CALCIUM: 10.5 mg/dL (ref 8.4–10.5)
CHLORIDE: 100 meq/L (ref 96–112)
CO2: 25 meq/L (ref 19–32)
Creatinine, Ser: 1.83 mg/dL — ABNORMAL HIGH (ref 0.40–1.50)
GFR: 37.82 mL/min — ABNORMAL LOW (ref >60.00)
Glucose, Bld: 59 mg/dL — ABNORMAL LOW (ref 70–99)
POTASSIUM: 4 meq/L (ref 3.5–5.1)
SODIUM: 137 meq/L (ref 135–145)
Total Bilirubin: 0.7 mg/dL (ref 0.2–1.2)
Total Protein: 6.9 g/dL (ref 6.0–8.3)

## 2017-12-16 MED ORDER — TRIAMTERENE-HCTZ 75-50 MG PO TABS: 0.5000 | ORAL_TABLET | Freq: Every day | ORAL | 1 refills | Status: DC

## 2017-12-16 NOTE — Patient Instructions (Signed)
Sleepy Time Tea, Chamomile Tea or Warm milk for sleep  Melatonin 2-10 mg at bed time  Insomnia Insomnia is a sleep disorder that makes it difficult to fall asleep or to stay asleep. Insomnia can cause tiredness (fatigue), low energy, difficulty concentrating, moo d swings, and poor performance at work or school. There are three different ways to classify insomnia:  Difficulty falling asleep.  Difficulty staying asleep.  Waking up too early in the morning.  Any type of insomnia can be long-term (chronic) or short-term (acute). Both are common. Short-term insomnia usually lasts for three months or less. Chronic insomnia occurs at least three times a week for longer than three months. What are the causes? Insomnia may be caused by another condition, situation, or substance, such as:  Anxiety.  Certain medicines.  Gastroesophageal reflux disease (GERD) or other gastrointestinal conditions.  Asthma or other breathing conditions.  Restless legs syndrome, sleep apnea, or other sleep disorders.  Chronic pain.  Menopause. This may include hot flashes.  Stroke.  Abuse of alcohol, tobacco, or illegal drugs.  Depression.  Caffeine.  Neurological disorders, such as Alzheimer disease.  An overactive thyroid (hyperthyroidism).  The cause of insomnia may not be known. What increases the risk? Risk factors for insomnia include:  Gender. Women are more commonly affected than men.  Age. Insomnia is more common as you get older.  Stress. This may involve your professional or personal life.  Income. Insomnia is more common in people with lower income.  Lack of exercise.  Irregular work schedule or night shifts.  Traveling between different time zones.  What are the signs or symptoms? If you have insomnia, trouble falling asleep or trouble staying asleep is the main symptom. This may lead to other symptoms, such as:  Feeling fatigued.  Feeling nervous about going to  sleep.  Not feeling rested in the morning.  Having trouble concentrating.  Feeling irritable, anxious, or depressed.  How is this treated? Treatment for insomnia depends on the cause. If your insomnia is caused by an underlying condition, treatment will focus on addressing the condition. Treatment may also include:  Medicines to help you sleep.  Counseling or therapy.  Lifestyle adjustments.  Follow these instructions at home:  Take medicines only as directed by your health care provider.  Keep regular sleeping and waking hours. Avoid naps.  Keep a sleep diary to help you and your health care provider figure out what could be causing your insomnia. Include: ? When you sleep. ? When you wake up during the night. ? How well you sleep. ? How rested you feel the next day. ? Any side effects of medicines you are taking. ? What you eat and drink.  Make your bedroom a comfortable place where it is easy to fall asleep: ? Put up shades or special blackout curtains to block light from outside. ? Use a white noise machine to block noise. ? Keep the temperature cool.  Exercise regularly as directed by your health care provider. Avoid exercising right before bedtime.  Use relaxation techniques to manage stress. Ask your health care provider to suggest some techniques that may work well for you. These may include: ? Breathing exercises. ? Routines to release muscle tension. ? Visualizing peaceful scenes.  Cut back on alcohol, caffeinated beverages, and cigarettes, especially close to bedtime. These can disrupt your sleep.  Do not overeat or eat spicy foods right before bedtime. This can lead to digestive discomfort that can make it hard for you  to sleep.  Limit screen use before bedtime. This includes: ? Watching TV. ? Using your smartphone, tablet, and computer.  Stick to a routine. This can help you fall asleep faster. Try to do a quiet activity, brush your teeth, and go to bed  at the same time each night.  Get out of bed if you are still awake after 15 minutes of trying to sleep. Keep the lights down, but try reading or doing a quiet activity. When you feel sleepy, go back to bed.  Make sure that you drive carefully. Avoid driving if you feel very sleepy.  Keep all follow-up appointments as directed by your health care provider. This is important. Contact a health care provider if:  You are tired throughout the day or have trouble in your daily routine due to sleepiness.  You continue to have sleep problems or your sleep problems get worse. Get help right away if:  You have serious thoughts about hurting yourself or someone else. This information is not intended to replace advice given to you by your health care provider. Make sure you discuss any questions you have with your health care provider. Document Released: 03/01/2000 Document Revised: 08/04/2015 Document Reviewed: 12/03/2013 Elsevier Interactive Patient Education  [employer].

## 2017-12-20 ENCOUNTER — Other Ambulatory Visit: Payer: Self-pay | Admitting: Family Medicine

## 2017-12-21 NOTE — Assessment & Plan Note (Signed)
Well controlled, no changes to meds. Encouraged heart healthy diet such as the DASH diet and exercise as tolerated.  °

## 2017-12-21 NOTE — Assessment & Plan Note (Signed)
Rate controlled, tolerating Eliquis 

## 2017-12-21 NOTE — Assessment & Plan Note (Signed)
minimize simple carbs. Increase exercise as tolerated. Continue current meds  

## 2017-12-21 NOTE — Assessment & Plan Note (Signed)
Monitor, hydrate well and no OTC meds without asking first.

## 2017-12-21 NOTE — Progress Notes (Signed)
Subjective:    Patient ID: Vincent Allen, male    DOB: 1933/12/28, 84 y.o.   MRN: [id number]  No chief complaint on file.   HPI Patient is in today for follow up accompanied by his son. He is doing better than at his last visit. He continues to struggle with insomnia. Trouble staying and falling asleep. No polyuria or polydipsia. No recent febrile illness or hospitalizations. Is eating well.   Past Medical History:  Diagnosis Date  . Arthritis   . Bradycardia   . Cataract   . Chronic kidney disease stage III (GFR 30-59 ml/min) 05/03/2012  . Colon polyps   . Complication of anesthesia    emesis  . Diabetes mellitus   . GERD (gastroesophageal reflux disease)   . HOH (hard of hearing)   . HTN (hypertension) 01/13/2013  . Hyperlipidemia   . Hypertension   . Pacemaker 12/2016  . Persistent atrial fibrillation   . Presence of permanent cardiac pacemaker 09/20/2016  . Symptomatic bradycardia   . Vitamin D deficiency 07/17/2015    Past Surgical History:  Procedure Laterality Date  . ANKLE ARTHROPLASTY    . CARDIOVERSION N/A 08/20/2016   Procedure: CARDIOVERSION;  Surgeon: Sanda Klein, MD;  Location: [facility];  Service: Cardiovascular;  Laterality: N/A;  . CATARACT EXTRACTION  2010, 2014  . CATARACT EXTRACTION  02/28/14  . CHOLECYSTECTOMY    . CHOLECYSTECTOMY N/A 05/03/2012   Procedure: LAPAROSCOPIC CHOLECYSTECTOMY WITH INTRAOPERATIVE CHOLANGIOGRAM;  Surgeon: [name], MD;  Location: [facility];  Service: General;  Laterality: N/A;  . INSERT / REPLACE / REMOVE PACEMAKER  09/20/2016  . PACEMAKER IMPLANT N/A 09/20/2016   Procedure: Pacemaker Implant;  Surgeon: Deboraha Sprang, MD;  Location: [facility];  Service: Cardiovascular;  Laterality: N/A;  . ROTATOR CUFF REPAIR    . SHOULDER SURGERY    . TONSILLECTOMY      Family History  Problem Relation Age of Onset  . Cancer Mother        colon, breast, pancreas, skin cancer  . Heart disease Father        heart  valve replaced  . Stroke Father   . Diabetes Father   . Cancer Paternal Grandmother        colon  . Obesity Son   . Heart disease Son        bradycardia    Social History   Socioeconomic History  . Marital status: Widowed    Spouse name: Not on file  . Number of children: Not on file  . Years of education: Not on file  . Highest education level: Not on file  Occupational History  . Not on file  Social Needs  . Financial resource strain: Not on file  . Food insecurity:    Worry: Not on file    Inability: Not on file  . Transportation needs:    Medical: Not on file    Non-medical: Not on file  Tobacco Use  . Smoking status: Never Smoker  . Smokeless tobacco: Never Used  Substance and Sexual Activity  . Alcohol use: Yes    Alcohol/week: 1.0 standard drinks    Types: 1 Glasses of wine per week  . Drug use: No  . 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.  Lifestyle  . Physical activity:    Days per week: Not on file  Minutes per session: Not on file  . Stress: Not on file  Relationships  . Social connections:    Talks on phone: Not on file    Gets together: Not on file    Attends religious service: Not on file    Active member of club or organization: Not on file    Attends meetings of clubs or organizations: Not on file    Relationship status: Not on file  . Intimate partner violence:    Fear of current or ex partner: Not on file    Emotionally abused: Not on file    Physically abused: Not on file    Forced sexual activity: Not on file  Other Topics Concern  . Not on file  Social History Narrative  . Not on file    Outpatient Medications Prior to Visit  Medication Sig Dispense Refill  . amLODipine (NORVASC) 5 MG tablet TAKE 1 TABLET BY MOUTH EVERY DAY 90 tablet 1  . Calcium 600 MG tablet Take 1 tablet (600 mg total) by mouth 2 (two) times daily. 60  tablet 0  . ELIQUIS 2.5 MG TABS tablet TAKE 1 TABLET (2.5 MG TOTAL) BY MOUTH 2 (TWO) TIMES DAILY. 60 tablet 2  . furosemide (LASIX) 20 MG tablet Take 1 tablet (20 mg total) by mouth daily as needed for edema (weight gain >3lbs in 24 hrs, SOB). 30 tablet 2  . glipiZIDE (GLUCOTROL) 10 MG tablet TAKE 2 TABLETS BY MOUTH TWICE DAILY BEFORE MEALS 360 tablet 0  . glucose blood (ONE TOUCH ULTRA TEST) test strip Use as twice daily to check blood sugar.  DX E11.9 100 each 6  . JANUVIA 100 MG tablet TAKE 1/2 TO 1 TABLET DAILY 90 tablet 1  . Lancets (ONETOUCH ULTRASOFT) lancets Use as instructed to check blood sugar twice a day as needed Dx Code E11.9 100 each 6  . losartan (COZAAR) 50 MG tablet TAKE 1 TABLET BY MOUTH EVERY DAY 90 tablet 2  . meclizine (ANTIVERT) 25 MG tablet Take 1 tablet (25 mg total) by mouth 3 (three) times daily as needed for dizziness. 30 tablet 0  . metFORMIN (GLUCOPHAGE) 500 MG tablet Take by mouth daily.    Marland Kitchen neomycin-polymyxin-hydrocortisone (CORTISPORIN) OTIC solution Place 4 drops into both ears 3 (three) times daily. 10 mL 0  . triamterene-hydrochlorothiazide (MAXZIDE) 75-50 MG tablet TAKE 1 TABLET BY MOUTH DAILY. 90 tablet 1   No facility-administered medications prior to visit.     Allergies  Allergen Reactions  . Penicillins Hives and Itching    Has patient had a PCN reaction causing immediate rash, facial/tongue/throat swelling, SOB or lightheadedness with hypotension:Yes Has patient had a PCN reaction causing severe rash involving mucus membranes or skin necrosis:No Has patient had a PCN reaction that required hospitalization:No Has patient had a PCN reaction occurring within the last 10 years:No If all of the above answers are "NO", then may proceed with Cephalosporin use.   . Verapamil     Junctional rhythm  . Influenza Vaccines Rash    Review of Systems  Constitutional: Positive for malaise/fatigue. Negative for fever.  HENT: Negative for congestion.   Eyes:  Negative for blurred vision.  Respiratory: Negative for shortness of breath.   Cardiovascular: Negative for chest pain, palpitations and leg swelling.  Gastrointestinal: Negative for abdominal pain, blood in stool and nausea.  Genitourinary: Negative for dysuria and frequency.  Musculoskeletal: Negative for falls.  Skin: Negative for rash.  Neurological: Negative for dizziness, loss of consciousness  and headaches.  Endo/Heme/Allergies: Negative for environmental allergies.  Psychiatric/Behavioral: Negative for depression. The patient has insomnia. The patient is not nervous/anxious.        Objective:    Physical Exam  Constitutional: He is oriented to person, place, and time. He appears well-developed and well-nourished. No distress.  HENT:  Head: Normocephalic and atraumatic.  Nose: Nose normal.  Eyes: Right eye exhibits no discharge. Left eye exhibits no discharge.  Neck: Normal range of motion. Neck supple.  Cardiovascular: Normal rate.  No murmur heard. irregular  Pulmonary/Chest: Effort normal and breath sounds normal.  Abdominal: Soft. Bowel sounds are normal. There is no tenderness.  Musculoskeletal: He exhibits no edema.  Neurological: He is alert and oriented to person, place, and time.  Skin: Skin is warm and dry.  Psychiatric: He has a normal mood and affect.  Nursing note and vitals reviewed.   BP 118/72 (BP Location: Left Arm, Patient Position: Sitting, Cuff Size: Normal)   Pulse 88   Temp 97.6 F (36.4 C) (Oral)   Resp 18   Wt 219 lb 9.6 oz (99.6 kg)   SpO2 97%   BMI 29.78 kg/m  Wt Readings from Last 3 Encounters:  12/16/17 219 lb 9.6 oz (99.6 kg)  11/11/17 220 lb 6.4 oz (100 kg)  11/04/17 222 lb (100.7 kg)     Lab Results  Component Value Date   WBC 7.6 11/06/2017   HGB 15.6 11/06/2017   HCT 48.3 11/06/2017   PLT 168.0 11/06/2017   GLUCOSE 59 (L) 12/16/2017   CHOL 99 11/06/2017   TRIG 80.0 11/06/2017   HDL 35.60 (L) 11/06/2017   LDLDIRECT  70.8 03/14/2014   LDLCALC 47 11/06/2017   ALT 20 12/16/2017   AST 14 12/16/2017   NA 137 12/16/2017   K 4.0 12/16/2017   CL 100 12/16/2017   CREATININE 1.83 (H) 12/16/2017   BUN 38 (H) 12/16/2017   CO2 25 12/16/2017   TSH 3.09 11/06/2017   PSA 1.18 10/07/2012   INR 1.0 08/15/2016   HGBA1C 7.7 (H) 11/06/2017   MICROALBUR 26.7 (H) 07/15/2016    Lab Results  Component Value Date   TSH 3.09 11/06/2017   Lab Results  Component Value Date   WBC 7.6 11/06/2017   HGB 15.6 11/06/2017   HCT 48.3 11/06/2017   MCV 85.4 11/06/2017   PLT 168.0 11/06/2017   Lab Results  Component Value Date   NA 137 12/16/2017   K 4.0 12/16/2017   CO2 25 12/16/2017   GLUCOSE 59 (L) 12/16/2017   BUN 38 (H) 12/16/2017   CREATININE 1.83 (H) 12/16/2017   BILITOT 0.7 12/16/2017   ALKPHOS 49 12/16/2017   AST 14 12/16/2017   ALT 20 12/16/2017   PROT 6.9 12/16/2017   ALBUMIN 3.9 12/16/2017   CALCIUM 10.5 12/16/2017   ANIONGAP 8 07/10/2016   GFR 37.82 (L) 12/16/2017   Lab Results  Component Value Date   CHOL 99 11/06/2017   Lab Results  Component Value Date   HDL 35.60 (L) 11/06/2017   Lab Results  Component Value Date   LDLCALC 47 11/06/2017   Lab Results  Component Value Date   TRIG 80.0 11/06/2017   Lab Results  Component Value Date   CHOLHDL 3 11/06/2017   Lab Results  Component Value Date   HGBA1C 7.7 (H) 11/06/2017       Assessment & Plan:   Problem List Items Addressed This Visit    Hypertensive heart disease - Primary (Chronic)  Relevant Medications   triamterene-hydrochlorothiazide (MAXZIDE) 75-50 MG tablet   Other Relevant Orders   Comprehensive metabolic panel (Completed)   Comprehensive metabolic panel   Chronic kidney disease stage III (GFR 30-59 ml/min) (Chronic)    Monitor, hydrate well and no OTC meds without asking first.       Insomnia    Encouraged good sleep hygiene such as dark, quiet room. No blue/green glowing lights such as computer screens in  bedroom. No alcohol or stimulants in evening. Cut down on caffeine as able. Regular exercise is helpful but not just prior to bed time. Consider melatonin      Essential hypertension    Well controlled, no changes to meds. Encouraged heart healthy diet such as the DASH diet and exercise as tolerated.       Relevant Medications   triamterene-hydrochlorothiazide (MAXZIDE) 75-50 MG tablet   Diabetes mellitus type 2 in obese (HCC)    minimize simple carbs. Increase exercise as tolerated. Continue current meds      Persistent atrial fibrillation    Rate controlled, tolerating Eliquis      Relevant Medications   triamterene-hydrochlorothiazide (MAXZIDE) 75-50 MG tablet      I have changed Adante C. Bancroft "Bob"'s triamterene-hydrochlorothiazide. I am also having him maintain his calcium carbonate, glucose blood, onetouch ultrasoft, furosemide, metFORMIN, amLODipine, losartan, glipiZIDE, meclizine, neomycin-polymyxin-hydrocortisone, JANUVIA, and ELIQUIS.  Meds ordered this encounter  Medications  . triamterene-hydrochlorothiazide (MAXZIDE) 75-50 MG tablet    Sig: Take 0.5 tablets by mouth daily.    Dispense:  90 tablet    Refill:  1     [name], MD

## 2017-12-21 NOTE — Assessment & Plan Note (Signed)
Encouraged good sleep hygiene such as dark, quiet room. No blue/green glowing lights such as computer screens in bedroom. No alcohol or stimulants in evening. Cut down on caffeine as able. Regular exercise is helpful but not just prior to bed time. Consider melatonin

## 2017-12-31 ENCOUNTER — Ambulatory Visit (INDEPENDENT_AMBULATORY_CARE_PROVIDER_SITE_OTHER): Payer: PPO | Admitting: *Deleted

## 2017-12-31 DIAGNOSIS — R001 Bradycardia, unspecified: Secondary | ICD-10-CM

## 2017-12-31 DIAGNOSIS — I5032 Chronic diastolic (congestive) heart failure: Secondary | ICD-10-CM

## 2017-12-31 NOTE — Progress Notes (Signed)
Remote pacemaker transmission.   

## 2018-01-02 ENCOUNTER — Encounter: Payer: Self-pay | Admitting: Cardiology

## 2018-01-05 ENCOUNTER — Other Ambulatory Visit: Payer: Self-pay | Admitting: Family Medicine

## 2018-01-22 ENCOUNTER — Ambulatory Visit (INDEPENDENT_AMBULATORY_CARE_PROVIDER_SITE_OTHER): Payer: PPO | Admitting: Family Medicine

## 2018-01-22 ENCOUNTER — Other Ambulatory Visit (INDEPENDENT_AMBULATORY_CARE_PROVIDER_SITE_OTHER): Payer: PPO

## 2018-01-22 VITALS — BP 132/78 | HR 65

## 2018-01-22 DIAGNOSIS — I119 Hypertensive heart disease without heart failure: Secondary | ICD-10-CM | POA: Diagnosis not present

## 2018-01-22 DIAGNOSIS — I10 Essential (primary) hypertension: Secondary | ICD-10-CM

## 2018-01-22 LAB — COMPREHENSIVE METABOLIC PANEL
ALT: 19 U/L (ref 0–53)
AST: 15 U/L (ref 0–37)
Albumin: 3.8 g/dL (ref 3.5–5.2)
Alkaline Phosphatase: 37 U/L — ABNORMAL LOW (ref 39–117)
BUN: 32 mg/dL — AB (ref 6–23)
CHLORIDE: 104 meq/L (ref 96–112)
CO2: 24 mEq/L (ref 19–32)
Calcium: 9.4 mg/dL (ref 8.4–10.5)
Creatinine, Ser: 1.62 mg/dL — ABNORMAL HIGH (ref 0.40–1.50)
GFR: 43.52 mL/min — AB (ref >60.00)
GLUCOSE: 85 mg/dL (ref 70–99)
POTASSIUM: 3.7 meq/L (ref 3.5–5.1)
SODIUM: 138 meq/L (ref 135–145)
Total Bilirubin: 0.7 mg/dL (ref 0.2–1.2)
Total Protein: 6.9 g/dL (ref 6.0–8.3)

## 2018-01-22 NOTE — Progress Notes (Addendum)
Pre visit review using our clinic tool,if applicable. No additional management support is needed unless otherwise documented below in the visit note.   Pt here for Blood pressure check per order from Dr. Frederik Pear dated 12/16/17.   Pt currently takes: Triamterene -Hctz 75-50 mg daily,Amlodipine 5 mg daily,Losartan 50 mg daily. Lasix 20 mg prn.   Pt reports compliance with medication. Patient has not had BP medication today. No complaints voiced.  BP today @ = 132/78 P = 65  Pt advised per Dr. [name] to continue medications as ordered and to return for provider visit as scheduled.  Nursing note reviewed. Agree with documention and plan.

## 2018-02-06 ENCOUNTER — Other Ambulatory Visit: Payer: Self-pay | Admitting: Family Medicine

## 2018-03-04 ENCOUNTER — Other Ambulatory Visit: Payer: Self-pay | Admitting: Family Medicine

## 2018-03-04 LAB — CUP PACEART REMOTE DEVICE CHECK
Battery Remaining Longevity: 137 mo
Battery Voltage: 3.02 V
Brady Statistic AP VP Percent: 0 %
Brady Statistic AP VS Percent: 0 %
Brady Statistic AS VP Percent: 0 %
Brady Statistic AS VS Percent: 0 %
Brady Statistic RA Percent Paced: 0.13 %
Brady Statistic RV Percent Paced: 98.85 %
Implantable Lead Implant Date: 20180706
Implantable Lead Implant Date: 20180706
Implantable Lead Location: 753859
Implantable Lead Location: 753860
Implantable Lead Model: 5076
Implantable Pulse Generator Implant Date: 20180706
Lead Channel Impedance Value: 323 Ohm
Lead Channel Impedance Value: 532 Ohm
Lead Channel Impedance Value: 570 Ohm
Lead Channel Impedance Value: 665 Ohm
Lead Channel Pacing Threshold Amplitude: 0.625 V
Lead Channel Pacing Threshold Pulse Width: 0.4 ms
Lead Channel Sensing Intrinsic Amplitude: 1.375 mV
Lead Channel Sensing Intrinsic Amplitude: 1.375 mV
Lead Channel Setting Pacing Amplitude: 2 V
Lead Channel Setting Pacing Amplitude: 2.5 V
Lead Channel Setting Pacing Pulse Width: 0.4 ms
MDC IDC MSMT LEADCHNL RV SENSING INTR AMPL: 13.25 mV
MDC IDC MSMT LEADCHNL RV SENSING INTR AMPL: 13.25 mV
MDC IDC SESS DTM: 20191016130642
MDC IDC SET LEADCHNL RV SENSING SENSITIVITY: 2.8 mV

## 2018-03-09 ENCOUNTER — Ambulatory Visit: Payer: PPO | Admitting: Podiatry

## 2018-03-09 ENCOUNTER — Encounter: Payer: Self-pay | Admitting: Podiatry

## 2018-03-09 DIAGNOSIS — B351 Tinea unguium: Secondary | ICD-10-CM | POA: Diagnosis not present

## 2018-03-09 DIAGNOSIS — M79675 Pain in left toe(s): Secondary | ICD-10-CM | POA: Diagnosis not present

## 2018-03-09 DIAGNOSIS — M79674 Pain in right toe(s): Secondary | ICD-10-CM | POA: Diagnosis not present

## 2018-03-09 DIAGNOSIS — D689 Coagulation defect, unspecified: Secondary | ICD-10-CM

## 2018-03-09 NOTE — Progress Notes (Signed)
Subjective:   Patient ID: Vincent Allen, male   DOB: 84 y.o.   MRN: [id number]   HPI Patient presents with elongated nailbeds with incurvation 1-5 both feet with patient on long-term coagulation therapy   ROS      Objective:  Physical Exam  Thick yellow brittle nailbeds 1-5 both feet that are incurvated with patient on coagulation therapy     Assessment:  Chronic mycotic nail infection 1-5 both feet     Plan:  Debride painful nailbeds 1-5 both feet with no iatrogenic bleeding noted

## 2018-03-16 ENCOUNTER — Other Ambulatory Visit: Payer: Self-pay | Admitting: Emergency Medicine

## 2018-03-16 DIAGNOSIS — E785 Hyperlipidemia, unspecified: Secondary | ICD-10-CM

## 2018-03-16 DIAGNOSIS — N183 Chronic kidney disease, stage 3 (moderate): Secondary | ICD-10-CM

## 2018-03-16 DIAGNOSIS — I10 Essential (primary) hypertension: Secondary | ICD-10-CM

## 2018-03-16 DIAGNOSIS — E0822 Diabetes mellitus due to underlying condition with diabetic chronic kidney disease: Secondary | ICD-10-CM

## 2018-03-19 ENCOUNTER — Other Ambulatory Visit (INDEPENDENT_AMBULATORY_CARE_PROVIDER_SITE_OTHER): Payer: PPO

## 2018-03-19 DIAGNOSIS — N183 Chronic kidney disease, stage 3 (moderate): Secondary | ICD-10-CM | POA: Diagnosis not present

## 2018-03-19 DIAGNOSIS — I10 Essential (primary) hypertension: Secondary | ICD-10-CM

## 2018-03-19 DIAGNOSIS — E785 Hyperlipidemia, unspecified: Secondary | ICD-10-CM | POA: Diagnosis not present

## 2018-03-19 DIAGNOSIS — E0822 Diabetes mellitus due to underlying condition with diabetic chronic kidney disease: Secondary | ICD-10-CM | POA: Diagnosis not present

## 2018-03-19 LAB — CBC
HCT: 48.2 % (ref 39.0–52.0)
Hemoglobin: 15.9 g/dL (ref 13.0–17.0)
MCHC: 32.9 g/dL (ref 30.0–36.0)
MCV: 85.1 fl (ref 78.0–100.0)
Platelets: 175 10*3/uL (ref 150.0–400.0)
RBC: 5.67 Mil/uL (ref 4.22–5.81)
RDW: 13.8 % (ref 11.5–15.5)
WBC: 7.5 10*3/uL (ref 4.0–10.5)

## 2018-03-19 LAB — COMPREHENSIVE METABOLIC PANEL
ALBUMIN: 3.7 g/dL (ref 3.5–5.2)
ALT: 18 U/L (ref 0–53)
AST: 14 U/L (ref 0–37)
Alkaline Phosphatase: 46 U/L (ref 39–117)
BUN: 30 mg/dL — ABNORMAL HIGH (ref 6–23)
CHLORIDE: 103 meq/L (ref 96–112)
CO2: 23 mEq/L (ref 19–32)
Calcium: 10 mg/dL (ref 8.4–10.5)
Creatinine, Ser: 1.54 mg/dL — ABNORMAL HIGH (ref 0.40–1.50)
GFR: 46.12 mL/min — ABNORMAL LOW (ref >60.00)
Glucose, Bld: 117 mg/dL — ABNORMAL HIGH (ref 70–99)
Potassium: 4 mEq/L (ref 3.5–5.1)
Sodium: 137 mEq/L (ref 135–145)
Total Bilirubin: 0.6 mg/dL (ref 0.2–1.2)
Total Protein: 6.5 g/dL (ref 6.0–8.3)

## 2018-03-19 LAB — HEMOGLOBIN A1C: Hgb A1c MFr Bld: 7.1 % — ABNORMAL HIGH (ref 4.6–6.5)

## 2018-03-19 LAB — LIPID PANEL
Cholesterol: 151 mg/dL (ref 0–200)
HDL: 33.2 mg/dL — ABNORMAL LOW (ref >39.00)
LDL Cholesterol: 86 mg/dL (ref 0–99)
NonHDL: 117.8
Total CHOL/HDL Ratio: 5
Triglycerides: 160 mg/dL — ABNORMAL HIGH (ref 0.0–149.0)
VLDL: 32 mg/dL (ref 0.0–40.0)

## 2018-03-23 ENCOUNTER — Ambulatory Visit (INDEPENDENT_AMBULATORY_CARE_PROVIDER_SITE_OTHER): Payer: PPO | Admitting: Family Medicine

## 2018-03-23 ENCOUNTER — Encounter: Payer: Self-pay | Admitting: Family Medicine

## 2018-03-23 VITALS — BP 132/80 | HR 70 | Temp 98.2°F | Ht 72.0 in | Wt 222.1 lb

## 2018-03-23 DIAGNOSIS — I10 Essential (primary) hypertension: Secondary | ICD-10-CM | POA: Diagnosis not present

## 2018-03-23 DIAGNOSIS — L821 Other seborrheic keratosis: Secondary | ICD-10-CM | POA: Diagnosis not present

## 2018-03-23 DIAGNOSIS — I4819 Other persistent atrial fibrillation: Secondary | ICD-10-CM

## 2018-03-23 DIAGNOSIS — E0822 Diabetes mellitus due to underlying condition with diabetic chronic kidney disease: Secondary | ICD-10-CM

## 2018-03-23 DIAGNOSIS — N289 Disorder of kidney and ureter, unspecified: Secondary | ICD-10-CM

## 2018-03-23 DIAGNOSIS — Z85828 Personal history of other malignant neoplasm of skin: Secondary | ICD-10-CM | POA: Diagnosis not present

## 2018-03-23 DIAGNOSIS — E785 Hyperlipidemia, unspecified: Secondary | ICD-10-CM

## 2018-03-23 DIAGNOSIS — H61002 Unspecified perichondritis of left external ear: Secondary | ICD-10-CM | POA: Diagnosis not present

## 2018-03-23 DIAGNOSIS — E559 Vitamin D deficiency, unspecified: Secondary | ICD-10-CM | POA: Diagnosis not present

## 2018-03-23 DIAGNOSIS — L570 Actinic keratosis: Secondary | ICD-10-CM | POA: Diagnosis not present

## 2018-03-23 DIAGNOSIS — N183 Chronic kidney disease, stage 3 (moderate): Secondary | ICD-10-CM

## 2018-03-23 MED ORDER — METFORMIN HCL ER 500 MG PO TB24: 500.0000 mg | ORAL_TABLET | Freq: Every day | ORAL | 1 refills | Status: DC

## 2018-03-23 NOTE — Assessment & Plan Note (Signed)
Supplement and monitor 

## 2018-03-23 NOTE — Progress Notes (Signed)
Subjective:    Patient ID: Vincent Allen, male    DOB: Jul 17, 1933, 85 y.o.   MRN: [id number]  Chief Complaint  Patient presents with  . Follow-up    HPI Patient is in today for follow up. He is feeling well. No recent febrile illness or hospitalizations. No polyuria or polydipsia. Is staying active is trying to maintain activity levels. Is interested in dropping his metformin dosing down and watching his intake more. No side effets reported. Denies CP/palp/SOB/HA/congestion/fevers/GI or GU c/o. Taking meds as prescribed  Past Medical History:  Diagnosis Date  . Arthritis   . Bradycardia   . Cataract   . Chronic kidney disease stage III (GFR 30-59 ml/min) 05/03/2012  . Colon polyps   . Complication of anesthesia    emesis  . Diabetes mellitus   . GERD (gastroesophageal reflux disease)   . HOH (hard of hearing)   . HTN (hypertension) 01/13/2013  . Hyperlipidemia   . Hypertension   . Pacemaker 12/2016  . Persistent atrial fibrillation   . Presence of permanent cardiac pacemaker 09/20/2016  . Symptomatic bradycardia   . Vitamin D deficiency 07/17/2015    Past Surgical History:  Procedure Laterality Date  . ANKLE ARTHROPLASTY    . CARDIOVERSION N/A 08/20/2016   Procedure: CARDIOVERSION;  Surgeon: Sanda Klein, MD;  Location: [facility];  Service: Cardiovascular;  Laterality: N/A;  . CATARACT EXTRACTION  2010, 2014  . CATARACT EXTRACTION  02/28/14  . CHOLECYSTECTOMY    . CHOLECYSTECTOMY N/A 05/03/2012   Procedure: LAPAROSCOPIC CHOLECYSTECTOMY WITH INTRAOPERATIVE CHOLANGIOGRAM;  Surgeon: [name], MD;  Location: [facility];  Service: General;  Laterality: N/A;  . INSERT / REPLACE / REMOVE PACEMAKER  09/20/2016  . PACEMAKER IMPLANT N/A 09/20/2016   Procedure: Pacemaker Implant;  Surgeon: Deboraha Sprang, MD;  Location: [facility];  Service: Cardiovascular;  Laterality: N/A;  . ROTATOR CUFF REPAIR    . SHOULDER SURGERY    . TONSILLECTOMY      Family History    Problem Relation Age of Onset  . Cancer Mother        colon, breast, pancreas, skin cancer  . Heart disease Father        heart valve replaced  . Stroke Father   . Diabetes Father   . Cancer Paternal Grandmother        colon  . Obesity Son   . Heart disease Son        bradycardia    Social History   Socioeconomic History  . Marital status: Widowed    Spouse name: Not on file  . Number of children: Not on file  . Years of education: Not on file  . Highest education level: Not on file  Occupational History  . Not on file  Social Needs  . Financial resource strain: Not on file  . Food insecurity:    Worry: Not on file    Inability: Not on file  . Transportation needs:    Medical: Not on file    Non-medical: Not on file  Tobacco Use  . Smoking status: Never Smoker  . Smokeless tobacco: Never Used  Substance and Sexual Activity  . Alcohol use: Yes    Alcohol/week: 1.0 standard drinks    Types: 1 Glasses of wine per week  . Drug use: No  . 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  Lifestyle  . Physical activity:    Days per week: Not on file    Minutes per session: Not on file  . Stress: Not on file  Relationships  . Social connections:    Talks on phone: Not on file    Gets together: Not on file    Attends religious service: Not on file    Active member of club or organization: Not on file    Attends meetings of clubs or organizations: Not on file    Relationship status: Not on file  . Intimate partner violence:    Fear of current or ex partner: Not on file    Emotionally abused: Not on file    Physically abused: Not on file    Forced sexual activity: Not on file  Other Topics Concern  . Not on file  Social History Narrative  . Not on file    Outpatient Medications Prior to Visit  Medication Sig Dispense Refill  . amLODipine (NORVASC) 5 MG tablet  TAKE 1 TABLET BY MOUTH EVERY DAY 90 tablet 1  . Calcium 600 MG tablet Take 1 tablet (600 mg total) by mouth 2 (two) times daily. 60 tablet 0  . ELIQUIS 2.5 MG TABS tablet TAKE 1 TABLET (2.5 MG TOTAL) BY MOUTH 2 (TWO) TIMES DAILY. 60 tablet 2  . furosemide (LASIX) 20 MG tablet TAKE 1 TABLET (20 MG TOTAL) BY MOUTH DAILY AS NEEDED FOR EDEMA (WEIGHT GAIN >3LBS IN 24 HRS, SOB). 30 tablet 2  . glipiZIDE (GLUCOTROL) 10 MG tablet TAKE 2 TABLETS BY MOUTH TWICE DAILY BEFORE MEALS 360 tablet 0  . glucose blood (ONE TOUCH ULTRA TEST) test strip Use as twice daily to check blood sugar.  DX E11.9 100 each 6  . JANUVIA 100 MG tablet TAKE 1/2 TO 1 TABLET DAILY 90 tablet 1  . Lancets (ONETOUCH ULTRASOFT) lancets Use as instructed to check blood sugar twice a day as needed Dx Code E11.9 100 each 6  . losartan (COZAAR) 50 MG tablet TAKE 1 TABLET BY MOUTH EVERY DAY 90 tablet 2  . meclizine (ANTIVERT) 25 MG tablet Take 1 tablet (25 mg total) by mouth 3 (three) times daily as needed for dizziness. 30 tablet 0  . neomycin-polymyxin-hydrocortisone (CORTISPORIN) OTIC solution Place 4 drops into both ears 3 (three) times daily. 10 mL 0  . triamterene-hydrochlorothiazide (MAXZIDE) 75-50 MG tablet Take 0.5 tablets by mouth daily. 90 tablet 1  . metFORMIN (GLUCOPHAGE) 500 MG tablet Take by mouth daily.    . metFORMIN (GLUCOPHAGE-XR) 500 MG 24 hr tablet Take 1 tablet (500 mg total) by mouth daily. 90 tablet 1  . metFORMIN (GLUCOPHAGE-XR) 500 MG 24 hr tablet TAKE 1 TABLET (500 MG TOTAL) BY MOUTH 2 (TWO) TIMES DAILY. 180 tablet 1   No facility-administered medications prior to visit.     Allergies  Allergen Reactions  . Penicillins Hives and Itching    Has patient had a PCN reaction causing immediate rash, facial/tongue/throat swelling, SOB or lightheadedness with hypotension:Yes Has patient had a PCN reaction causing severe rash involving mucus membranes or skin necrosis:No Has patient had a PCN reaction that required  hospitalization:No Has patient had a PCN reaction occurring within the last 10 years:No If all of the above answers are "NO", then may proceed with Cephalosporin use.   . Verapamil     Junctional rhythm  . Influenza Vaccines Rash    Review of Systems  Constitutional: Negative for fever and malaise/fatigue.  HENT:  Negative for congestion.   Eyes: Negative for blurred vision.  Respiratory: Negative for shortness of breath.   Cardiovascular: Negative for chest pain, palpitations and leg swelling.  Gastrointestinal: Negative for abdominal pain, blood in stool and nausea.  Genitourinary: Negative for dysuria and frequency.  Musculoskeletal: Negative for falls.  Skin: Negative for rash.  Neurological: Negative for dizziness, loss of consciousness and headaches.  Endo/Heme/Allergies: Negative for environmental allergies.  Psychiatric/Behavioral: Negative for depression. The patient is not nervous/anxious.        Objective:    Physical Exam Vitals signs and nursing note reviewed.  Constitutional:      General: He is not in acute distress.    Appearance: He is well-developed.  HENT:     Head: Normocephalic and atraumatic.     Nose: Nose normal.  Eyes:     General:        Right eye: No discharge.        Left eye: No discharge.  Neck:     Musculoskeletal: Normal range of motion and neck supple.  Cardiovascular:     Rate and Rhythm: Normal rate. Rhythm irregular.     Heart sounds: No murmur.  Pulmonary:     Effort: Pulmonary effort is normal.     Breath sounds: Normal breath sounds.  Abdominal:     General: Bowel sounds are normal.     Palpations: Abdomen is soft.     Tenderness: There is no abdominal tenderness.  Skin:    General: Skin is warm and dry.  Neurological:     Mental Status: He is alert and oriented to person, place, and time.     BP 132/80 (BP Location: Left Arm, Patient Position: Sitting, Cuff Size: Normal)   Pulse 70   Temp 98.2 F (36.8 C) (Oral)   Ht  6' (1.829 m)   Wt 222 lb 2 oz (100.8 kg)   SpO2 97%   BMI 30.13 kg/m  Wt Readings from Last 3 Encounters:  03/23/18 222 lb 2 oz (100.8 kg)  12/16/17 219 lb 9.6 oz (99.6 kg)  11/11/17 220 lb 6.4 oz (100 kg)     Lab Results  Component Value Date   WBC 7.5 03/19/2018   HGB 15.9 03/19/2018   HCT 48.2 03/19/2018   PLT 175.0 03/19/2018   GLUCOSE 117 (H) 03/19/2018   CHOL 151 03/19/2018   TRIG 160.0 (H) 03/19/2018   HDL 33.20 (L) 03/19/2018   LDLDIRECT 70.8 03/14/2014   LDLCALC 86 03/19/2018   ALT 18 03/19/2018   AST 14 03/19/2018   NA 137 03/19/2018   K 4.0 03/19/2018   CL 103 03/19/2018   CREATININE 1.54 (H) 03/19/2018   BUN 30 (H) 03/19/2018   CO2 23 03/19/2018   TSH 3.09 11/06/2017   PSA 1.18 10/07/2012   INR 1.0 08/15/2016   HGBA1C 7.1 (H) 03/19/2018   MICROALBUR 26.7 (H) 07/15/2016    Lab Results  Component Value Date   TSH 3.09 11/06/2017   Lab Results  Component Value Date   WBC 7.5 03/19/2018   HGB 15.9 03/19/2018   HCT 48.2 03/19/2018   MCV 85.1 03/19/2018   PLT 175.0 03/19/2018   Lab Results  Component Value Date   NA 137 03/19/2018   K 4.0 03/19/2018   CO2 23 03/19/2018   GLUCOSE 117 (H) 03/19/2018   BUN 30 (H) 03/19/2018   CREATININE 1.54 (H) 03/19/2018   BILITOT 0.6 03/19/2018   ALKPHOS 46 03/19/2018   AST 14 03/19/2018  ALT 18 03/19/2018   PROT 6.5 03/19/2018   ALBUMIN 3.7 03/19/2018   CALCIUM 10.0 03/19/2018   ANIONGAP 8 07/10/2016   GFR 46.12 (L) 03/19/2018   Lab Results  Component Value Date   CHOL 151 03/19/2018   Lab Results  Component Value Date   HDL 33.20 (L) 03/19/2018   Lab Results  Component Value Date   LDLCALC 86 03/19/2018   Lab Results  Component Value Date   TRIG 160.0 (H) 03/19/2018   Lab Results  Component Value Date   CHOLHDL 5 03/19/2018   Lab Results  Component Value Date   HGBA1C 7.1 (H) 03/19/2018       Assessment & Plan:   Problem List Items Addressed This Visit    Diabetes mellitus with  chronic kidney disease ([place]) (Chronic)    hgba1c acceptable, minimize simple carbs. Increase exercise as tolerated. Requests drop of Metformin to once daily and he is interested in trying Berberine. Check cmp in 1 month      Relevant Medications   metFORMIN (GLUCOPHAGE-XR) 500 MG 24 hr tablet   Hyperlipidemia (Chronic)    Encouraged heart healthy diet, increase exercise, avoid trans fats, consider a krill oil cap daily      Essential hypertension    Well controlled, no changes to meds. Encouraged heart healthy diet such as the DASH diet and exercise as tolerated.       Vitamin D deficiency    Supplement and monitor      Persistent atrial fibrillation    Tolerating Eliquis, RRR         I have discontinued Jaxzen C. Lipson "Bob"'s metFORMIN and metFORMIN. I have also changed his metFORMIN. Additionally, I am having him maintain his calcium carbonate, glucose blood, onetouch ultrasoft, losartan, meclizine, neomycin-polymyxin-hydrocortisone, JANUVIA, ELIQUIS, triamterene-hydrochlorothiazide, glipiZIDE, amLODipine, and furosemide.  Meds ordered this encounter  Medications  . metFORMIN (GLUCOPHAGE-XR) 500 MG 24 hr tablet    Sig: Take 1 tablet (500 mg total) by mouth daily with breakfast.    Dispense:  180 tablet    Refill:  1     [name], MD

## 2018-03-23 NOTE — Assessment & Plan Note (Signed)
Well controlled, no changes to meds. Encouraged heart healthy diet such as the DASH diet and exercise as tolerated.  °

## 2018-03-23 NOTE — Progress Notes (Signed)
Pre visit review using our clinic review tool, if applicable. No additional management support is needed unless otherwise documented below in the visit note. 

## 2018-03-23 NOTE — Assessment & Plan Note (Signed)
hgba1c acceptable, minimize simple carbs. Increase exercise as tolerated. Requests drop of Metformin to once daily and he is interested in trying Berberine. Check cmp in 1 month

## 2018-03-23 NOTE — Patient Instructions (Signed)
Consider the new shingles called Shingrix 2 shots over 2-6 months at the pharmacy.   Hypertension Hypertension, commonly called high blood pressure, is when the force of blood pumping through the arteries is too strong. The arteries are the blood vessels that carry blood from the heart throughout the body. Hypertension forces the heart to work harder to pump blood and may cause arteries to become narrow or stiff. Having untreated or uncontrolled hypertension can cause heart attacks, strokes, kidney disease, and other problems. A blood pressure reading consists of a higher number over a lower number. Ideally, your blood pressure should be below 120/80. The first ("top") number is called the systolic pressure. It is a measure of the pressure in your arteries as your heart beats. The second ("bottom") number is called the diastolic pressure. It is a measure of the pressure in your arteries as the heart relaxes. What are the causes? The cause of this condition is not known. What increases the risk? Some risk factors for high blood pressure are under your control. Others are not. Factors you can change  Smoking.  Having type 2 diabetes mellitus, high cholesterol, or both.  Not getting enough exercise or physical activity.  Being overweight.  Having too much fat, sugar, calories, or salt (sodium) in your diet.  Drinking too much alcohol. Factors that are difficult or impossible to change  Having chronic kidney disease.  Having a family history of high blood pressure.  Age. Risk increases with age.  Race. You may be at higher risk if you are African-American.  Gender. Men are at higher risk than women before age 47. After age 91, women are at higher risk than men.  Having obstructive sleep apnea.  Stress. What are the signs or symptoms? Extremely high blood pressure (hypertensive crisis) may cause:  Headache.  Anxiety.  Shortness of breath.  Nosebleed.  Nausea and  vomiting.  Severe chest pain.  Jerky movements you cannot control (seizures). How is this diagnosed? This condition is diagnosed by measuring your blood pressure while you are seated, with your arm resting on a surface. The cuff of the blood pressure monitor will be placed directly against the skin of your upper arm at the level of your heart. It should be measured at least twice using the same arm. Certain conditions can cause a difference in blood pressure between your right and left arms. Certain factors can cause blood pressure readings to be lower or higher than normal (elevated) for a short period of time:  When your blood pressure is higher when you are in a health care provider's office than when you are at home, this is called white coat hypertension. Most people with this condition do not need medicines.  When your blood pressure is higher at home than when you are in a health care provider's office, this is called masked hypertension. Most people with this condition may need medicines to control blood pressure. If you have a high blood pressure reading during one visit or you have normal blood pressure with other risk factors:  You may be asked to return on a different day to have your blood pressure checked again.  You may be asked to monitor your blood pressure at home for 1 week or longer. If you are diagnosed with hypertension, you may have other blood or imaging tests to help your health care provider understand your overall risk for other conditions. How is this treated? This condition is treated by making healthy lifestyle changes,  such as eating healthy foods, exercising more, and reducing your alcohol intake. Your health care provider may prescribe medicine if lifestyle changes are not enough to get your blood pressure under control, and if:  Your systolic blood pressure is above 130.  Your diastolic blood pressure is above 80. Your personal target blood pressure may vary  depending on your medical conditions, your age, and other factors. Follow these instructions at home: Eating and drinking   Eat a diet that is high in fiber and potassium, and low in sodium, added sugar, and fat. An example eating plan is called the DASH (Dietary Approaches to Stop Hypertension) diet. To eat this way: ? Eat plenty of fresh fruits and vegetables. Try to fill half of your plate at each meal with fruits and vegetables. ? Eat whole grains, such as whole wheat pasta, brown rice, or whole grain bread. Fill about one quarter of your plate with whole grains. ? Eat or drink low-fat dairy products, such as skim milk or low-fat yogurt. ? Avoid fatty cuts of meat, processed or cured meats, and poultry with skin. Fill about one quarter of your plate with lean proteins, such as fish, chicken without skin, beans, eggs, and tofu. ? Avoid premade and processed foods. These tend to be higher in sodium, added sugar, and fat.  Reduce your daily sodium intake. Most people with hypertension should eat less than 1,500 mg of sodium a day.  Limit alcohol intake to no more than 1 drink a day for nonpregnant women and 2 drinks a day for men. One drink equals 12 oz of beer, 5 oz of wine, or 1 oz of hard liquor. Lifestyle   Work with your health care provider to maintain a healthy body weight or to lose weight. Ask what an ideal weight is for you.  Get at least 30 minutes of exercise that causes your heart to beat faster (aerobic exercise) most days of the week. Activities may include walking, swimming, or biking.  Include exercise to strengthen your muscles (resistance exercise), such as pilates or lifting weights, as part of your weekly exercise routine. Try to do these types of exercises for 30 minutes at least 3 days a week.  Do not use any products that contain nicotine or tobacco, such as cigarettes and e-cigarettes. If you need help quitting, ask your health care provider.  Monitor your blood  pressure at home as told by your health care provider.  Keep all follow-up visits as told by your health care provider. This is important. Medicines  Take over-the-counter and prescription medicines only as told by your health care provider. Follow directions carefully. Blood pressure medicines must be taken as prescribed.  Do not skip doses of blood pressure medicine. Doing this puts you at risk for problems and can make the medicine less effective.  Ask your health care provider about side effects or reactions to medicines that you should watch for. Contact a health care provider if:  You think you are having a reaction to a medicine you are taking.  You have headaches that keep coming back (recurring).  You feel dizzy.  You have swelling in your ankles.  You have trouble with your vision. Get help right away if:  You develop a severe headache or confusion.  You have unusual weakness or numbness.  You feel faint.  You have severe pain in your chest or abdomen.  You vomit repeatedly.  You have trouble breathing. Summary  Hypertension is when the force  of blood pumping through your arteries is too strong. If this condition is not controlled, it may put you at risk for serious complications.  Your personal target blood pressure may vary depending on your medical conditions, your age, and other factors. For most people, a normal blood pressure is less than 120/80.  Hypertension is treated with lifestyle changes, medicines, or a combination of both. Lifestyle changes include weight loss, eating a healthy, low-sodium diet, exercising more, and limiting alcohol. This information is not intended to replace advice given to you by your health care provider. Make sure you discuss any questions you have with your health care provider. Document Released: 03/04/2005 Document Revised: 01/31/2016 Document Reviewed: 01/31/2016 Elsevier Interactive Patient Education  2019 [employer].

## 2018-03-23 NOTE — Assessment & Plan Note (Signed)
Encouraged heart healthy diet, increase exercise, avoid trans fats, consider a krill oil cap daily 

## 2018-03-23 NOTE — Assessment & Plan Note (Signed)
Tolerating Eliquis, RRR

## 2018-04-01 ENCOUNTER — Ambulatory Visit (INDEPENDENT_AMBULATORY_CARE_PROVIDER_SITE_OTHER): Payer: PPO

## 2018-04-01 DIAGNOSIS — R001 Bradycardia, unspecified: Secondary | ICD-10-CM | POA: Diagnosis not present

## 2018-04-01 DIAGNOSIS — I495 Sick sinus syndrome: Secondary | ICD-10-CM

## 2018-04-02 NOTE — Progress Notes (Signed)
Remote pacemaker transmission.   

## 2018-04-03 ENCOUNTER — Other Ambulatory Visit: Payer: Self-pay | Admitting: Family Medicine

## 2018-04-03 LAB — CUP PACEART REMOTE DEVICE CHECK
Battery Remaining Longevity: 137 mo
Battery Voltage: 3.02 V
Brady Statistic AP VP Percent: 0 %
Brady Statistic AP VS Percent: 0 %
Brady Statistic AS VS Percent: 0 %
Brady Statistic RA Percent Paced: 0.15 %
Brady Statistic RV Percent Paced: 99.37 %
Date Time Interrogation Session: 20200115133129
Implantable Lead Implant Date: 20180706
Implantable Lead Location: 753859
Implantable Lead Location: 753860
Implantable Lead Model: 5076
Implantable Lead Model: 5076
Implantable Pulse Generator Implant Date: 20180706
Lead Channel Impedance Value: 342 Ohm
Lead Channel Impedance Value: 608 Ohm
Lead Channel Impedance Value: 760 Ohm
Lead Channel Pacing Threshold Amplitude: 0.75 V
Lead Channel Pacing Threshold Pulse Width: 0.4 ms
Lead Channel Sensing Intrinsic Amplitude: 1.125 mV
Lead Channel Sensing Intrinsic Amplitude: 1.125 mV
Lead Channel Sensing Intrinsic Amplitude: 9 mV
Lead Channel Sensing Intrinsic Amplitude: 9 mV
Lead Channel Setting Pacing Amplitude: 2 V
Lead Channel Setting Pacing Amplitude: 2.5 V
Lead Channel Setting Pacing Pulse Width: 0.4 ms
Lead Channel Setting Sensing Sensitivity: 2.8 mV
MDC IDC LEAD IMPLANT DT: 20180706
MDC IDC MSMT LEADCHNL RA IMPEDANCE VALUE: 570 Ohm
MDC IDC STAT BRADY AS VP PERCENT: 0 %

## 2018-04-13 ENCOUNTER — Other Ambulatory Visit: Payer: Self-pay | Admitting: Family Medicine

## 2018-04-13 DIAGNOSIS — I4819 Other persistent atrial fibrillation: Secondary | ICD-10-CM

## 2018-04-23 ENCOUNTER — Ambulatory Visit (INDEPENDENT_AMBULATORY_CARE_PROVIDER_SITE_OTHER): Payer: PPO | Admitting: Family Medicine

## 2018-04-23 DIAGNOSIS — E785 Hyperlipidemia, unspecified: Secondary | ICD-10-CM | POA: Diagnosis not present

## 2018-04-23 DIAGNOSIS — E0822 Diabetes mellitus due to underlying condition with diabetic chronic kidney disease: Secondary | ICD-10-CM | POA: Diagnosis not present

## 2018-04-23 DIAGNOSIS — N183 Chronic kidney disease, stage 3 (moderate): Secondary | ICD-10-CM | POA: Diagnosis not present

## 2018-04-23 DIAGNOSIS — I10 Essential (primary) hypertension: Secondary | ICD-10-CM | POA: Diagnosis not present

## 2018-04-23 DIAGNOSIS — N289 Disorder of kidney and ureter, unspecified: Secondary | ICD-10-CM

## 2018-04-23 LAB — COMPREHENSIVE METABOLIC PANEL
ALT: 17 U/L (ref 0–53)
AST: 14 U/L (ref 0–37)
Albumin: 3.8 g/dL (ref 3.5–5.2)
Alkaline Phosphatase: 48 U/L (ref 39–117)
BILIRUBIN TOTAL: 0.9 mg/dL (ref 0.2–1.2)
BUN: 23 mg/dL (ref 6–23)
CO2: 25 mEq/L (ref 19–32)
Calcium: 10 mg/dL (ref 8.4–10.5)
Chloride: 102 mEq/L (ref 96–112)
Creatinine, Ser: 1.52 mg/dL — ABNORMAL HIGH (ref 0.40–1.50)
GFR: 44.04 mL/min — ABNORMAL LOW (ref >60.00)
Glucose, Bld: 119 mg/dL — ABNORMAL HIGH (ref 70–99)
Potassium: 4 mEq/L (ref 3.5–5.1)
Sodium: 138 mEq/L (ref 135–145)
Total Protein: 6.6 g/dL (ref 6.0–8.3)

## 2018-04-23 LAB — CBC
HCT: 47.7 % (ref 39.0–52.0)
Hemoglobin: 15.7 g/dL (ref 13.0–17.0)
MCHC: 32.9 g/dL (ref 30.0–36.0)
MCV: 84.7 fl (ref 78.0–100.0)
Platelets: 180 10*3/uL (ref 150.0–400.0)
RBC: 5.63 Mil/uL (ref 4.22–5.81)
RDW: 13.7 % (ref 11.5–15.5)
WBC: 7.3 10*3/uL (ref 4.0–10.5)

## 2018-04-23 LAB — LIPID PANEL
Cholesterol: 171 mg/dL (ref 0–200)
HDL: 34.3 mg/dL — ABNORMAL LOW (ref >39.00)
LDL Cholesterol: 100 mg/dL — ABNORMAL HIGH (ref 0–99)
NONHDL: 136.68
Total CHOL/HDL Ratio: 5
Triglycerides: 182 mg/dL — ABNORMAL HIGH (ref 0.0–149.0)
VLDL: 36.4 mg/dL (ref 0.0–40.0)

## 2018-04-23 LAB — TSH: TSH: 3.16 u[IU]/mL (ref 0.35–4.50)

## 2018-04-23 LAB — HEMOGLOBIN A1C: Hgb A1c MFr Bld: 7.1 % — ABNORMAL HIGH (ref 4.6–6.5)

## 2018-04-23 NOTE — Progress Notes (Signed)
Lab visit

## 2018-05-07 ENCOUNTER — Other Ambulatory Visit: Payer: Self-pay | Admitting: Family Medicine

## 2018-05-11 ENCOUNTER — Encounter: Payer: Self-pay | Admitting: Internal Medicine

## 2018-05-11 ENCOUNTER — Ambulatory Visit: Payer: PPO | Admitting: Internal Medicine

## 2018-05-11 VITALS — BP 136/74 | HR 75 | Ht 72.0 in | Wt 223.0 lb

## 2018-05-11 DIAGNOSIS — E785 Hyperlipidemia, unspecified: Secondary | ICD-10-CM | POA: Diagnosis not present

## 2018-05-11 DIAGNOSIS — Z950 Presence of cardiac pacemaker: Secondary | ICD-10-CM | POA: Diagnosis not present

## 2018-05-11 DIAGNOSIS — I5032 Chronic diastolic (congestive) heart failure: Secondary | ICD-10-CM | POA: Diagnosis not present

## 2018-05-11 DIAGNOSIS — I10 Essential (primary) hypertension: Secondary | ICD-10-CM | POA: Diagnosis not present

## 2018-05-11 MED ORDER — ATORVASTATIN CALCIUM 20 MG PO TABS: 20.0000 mg | ORAL_TABLET | Freq: Every day | ORAL | 3 refills | Status: DC

## 2018-05-11 NOTE — Progress Notes (Signed)
OFFICE FOLLOW-UP NOTE  Chief Complaint:  Follow-up pacemaker  Primary Care Physician: [name], MD  HPI:  Vincent Allen is a 85 y.o. male with a past medial history significant for atrial fibrillation/flutter with slow ventricular response. Seen recently in the hospital after mechanical fall in the home. He has a history of paroxysmal atrial fibrillation in the past and was seen by Dr. Wynonia Lawman that was not on any anticoagulation. His CHADSVASC score is 4. He was started on Eliquis 2.5 mg twice a day due to age greater than 80 creatinine greater than 1.5. An echocardiogram was performed which showed normal systolic function, diastolic dysfunction moderate left atrial enlargement. I arranged for outpatient monitoring which shows persistent atrial fibrillation/flutter with controlled ventricular response and at times bradycardia but no clear long pauses or any other indications for pacemaker at this time. He reports being fatigued. He's been an active problem for more than 60 years and is interested in getting back to [place] feels that his lack of energy is keeping him back from that.  09/12/2016  Vincent Allen returns for follow-up today. He underwent recent a cardioversion by Dr. Sallyanne Kuster. This was successful and he reported feeling a little better with heart rates in sinus in the 50s for about a week, however then started to feel more fatigued. He's not been on any AV nodal blocking medications. He reports compliance with Eliquis. EKG today shows atrial fibrillation with a marked bradycardic response of 40. Notes that his heart rate will not necessarily increase with any exertion.  10/24/2016  Vincent Allen returns today for follow-up. He underwent successful pacemaker placement by Dr. Caryl Comes about 3 weeks ago. This was a Medtronic dual-chamber pacemaker. Subsequent pacemaker follow-up shows 100% atrial fibrillation. Pacemaker appears to be working properly. The wound appears to be well-healed. He is  restricted his arm movement now and is interested in more activity. He recently had lab work in advance to upcoming visit with Dr. Randel Pigg. Total cholesterol is 110, Travis was 167, HDL 37 and LDL 39. Metabolic profile was unremarkable with potassium however was slightly low at 3.4 and creatinine 1.45. Blood pressure is elevated today. I reviewed home readings indicating his blood pressure ranges from the about the 188C to 166A systolic over 63K diastolic. Heart rate is been mostly in the 70s and 80s. There is no evidence of rapid atrial fibrillation or bradycardia. He reports improvement in his energy level.  04/28/2017  Vincent Allen returns today for follow-up.  He has had significant improvement in his symptoms after placement of pacemaker.  He still gets a little short of breath but is not been quite as active.  Weight is somewhat better now to 19 and was as high as 225 in November.  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  Blood pressure is well controlled today.  Has had follow-up with Dr. Caryl Comes who feels the pacemaker is working appropriately.  05/11/2018  Vincent Allen is seen today in annual follow-up.  Overall he is doing well.  He is not been working on his airplane is much recently.  He says that he is worried about falls.  He did sell off a lot of his projects as well as his angina recently.  He denies any chest pain or shortness of breath.  He said no syncopal episodes.  Recent pacemaker interrogation shows a normal pacing and he is ventricular paced today.  He has persistent if not permanent A. fib.  He is not  have any bleeding problems on Eliquis.  Blood pressures been well controlled.  Lab work recently and April 23, 2018 showed total cholesterol 171, HDL 34, triglycerides of 182 and LDL of 100.  His goal LDL should be slightly lower than that I would suspect if we want to be aggressive probably less than 100.  He is not listed to be on statin therapy but previously was on  atorvastatin 20 mg.  He is not sure if he is taking it but again I had him listed on that a year ago.  I would advise if he is come off the medication to restart that otherwise we may need to consider increasing it further.  PMHx:  Past Medical History:  Diagnosis Date  . Arthritis   . Bradycardia   . Cataract   . Chronic kidney disease stage III (GFR 30-59 ml/min) 05/03/2012  . Colon polyps   . Complication of anesthesia    emesis  . Diabetes mellitus   . GERD (gastroesophageal reflux disease)   . HOH (hard of hearing)   . HTN (hypertension) 01/13/2013  . Hyperlipidemia   . Hypertension   . Pacemaker 12/2016  . Persistent atrial fibrillation   . Presence of permanent cardiac pacemaker 09/20/2016  . Symptomatic bradycardia   . Vitamin D deficiency 07/17/2015    Past Surgical History:  Procedure Laterality Date  . ANKLE ARTHROPLASTY    . CARDIOVERSION N/A 08/20/2016   Procedure: CARDIOVERSION;  Surgeon: Sanda Klein, MD;  Location: [facility];  Service: Cardiovascular;  Laterality: N/A;  . CATARACT EXTRACTION  2010, 2014  . CATARACT EXTRACTION  02/28/14  . CHOLECYSTECTOMY    . CHOLECYSTECTOMY N/A 05/03/2012   Procedure: LAPAROSCOPIC CHOLECYSTECTOMY WITH INTRAOPERATIVE CHOLANGIOGRAM;  Surgeon: [name], MD;  Location: [facility];  Service: General;  Laterality: N/A;  . INSERT / REPLACE / REMOVE PACEMAKER  09/20/2016  . PACEMAKER IMPLANT N/A 09/20/2016   Procedure: Pacemaker Implant;  Surgeon: Deboraha Sprang, MD;  Location: [facility];  Service: Cardiovascular;  Laterality: N/A;  . ROTATOR CUFF REPAIR    . SHOULDER SURGERY    . TONSILLECTOMY      FAMHx:  Family History  Problem Relation Age of Onset  . Cancer Mother        colon, breast, pancreas, skin cancer  . Heart disease Father        heart valve replaced  . Stroke Father   . Diabetes Father   . Cancer Paternal Grandmother        colon  . Obesity Son   . Heart disease Son        bradycardia    SOCHx:    reports that he has never smoked. He has never used smokeless tobacco. He reports current alcohol use of about 1.0 standard drinks of alcohol per week. He reports that he does not use drugs.  ALLERGIES:  Allergies  Allergen Reactions  . Penicillins Hives and Itching    Has patient had a PCN reaction causing immediate rash, facial/tongue/throat swelling, SOB or lightheadedness with hypotension:Yes Has patient had a PCN reaction causing severe rash involving mucus membranes or skin necrosis:No Has patient had a PCN reaction that required hospitalization:No Has patient had a PCN reaction occurring within the last 10 years:No If all of the above answers are "NO", then may proceed with Cephalosporin use.   . Verapamil     Junctional rhythm  . Influenza Vaccines Rash    ROS: Pertinent items noted in  HPI and remainder of comprehensive ROS otherwise negative.  HOME MEDS: Current Outpatient Medications on File Prior to Visit  Medication Sig Dispense Refill  . amLODipine (NORVASC) 5 MG tablet TAKE 1 TABLET BY MOUTH EVERY DAY 90 tablet 1  . Calcium 600 MG tablet Take 1 tablet (600 mg total) by mouth 2 (two) times daily. 60 tablet 0  . ELIQUIS 2.5 MG TABS tablet TAKE 1 TABLET (2.5 MG TOTAL) BY MOUTH 2 (TWO) TIMES DAILY. 60 tablet 2  . furosemide (LASIX) 20 MG tablet TAKE 1 TABLET (20 MG TOTAL) BY MOUTH DAILY AS NEEDED FOR EDEMA (WEIGHT GAIN >3LBS IN 24 HRS, SOB). 30 tablet 2  . glipiZIDE (GLUCOTROL) 10 MG tablet TAKE 2 TABLETS BY MOUTH TWICE DAILY BEFORE MEALS 360 tablet 0  . glucose blood (ONE TOUCH ULTRA TEST) test strip Use as twice daily to check blood sugar.  DX E11.9 100 each 6  . JANUVIA 100 MG tablet TAKE 1/2 TO 1 TABLET DAILY 30 tablet 5  . Lancets (ONETOUCH ULTRASOFT) lancets Use as instructed to check blood sugar twice a day as needed Dx Code E11.9 100 each 6  . losartan (COZAAR) 50 MG tablet TAKE 1 TABLET BY MOUTH EVERY DAY 90 tablet 2  . meclizine (ANTIVERT) 25 MG tablet Take 1  tablet (25 mg total) by mouth 3 (three) times daily as needed for dizziness. 30 tablet 0  . metFORMIN (GLUCOPHAGE-XR) 500 MG 24 hr tablet Take 1 tablet (500 mg total) by mouth daily with breakfast. 180 tablet 1  . neomycin-polymyxin-hydrocortisone (CORTISPORIN) OTIC solution Place 4 drops into both ears 3 (three) times daily. 10 mL 0  . triamterene-hydrochlorothiazide (MAXZIDE) 75-50 MG tablet TAKE 1 TABLET BY MOUTH DAILY. 90 tablet 1   No current facility-administered medications on file prior to visit.     LABS/IMAGING: No results found for this or any previous visit (from the past 48 hour(s)). No results found.  LIPID PANEL:    Component Value Date/Time   CHOL 171 04/23/2018 0740   TRIG 182.0 (H) 04/23/2018 0740   HDL 34.30 (L) 04/23/2018 0740   CHOLHDL 5 04/23/2018 0740   VLDL 36.4 04/23/2018 0740   LDLCALC 100 (H) 04/23/2018 0740   LDLDIRECT 70.8 03/14/2014 1401     WEIGHTS: Wt Readings from Last 3 Encounters:  05/11/18 223 lb (101.2 kg)  03/23/18 222 lb 2 oz (100.8 kg)  12/16/17 219 lb 9.6 oz (99.6 kg)    VITALS: BP 136/74   Pulse 75   Ht 6' (1.829 m)   Wt 223 lb (101.2 kg)   BMI 30.24 kg/m   EXAM: General appearance: alert and no distress Neck: no carotid bruit, no JVD and thyroid not enlarged, symmetric, no tenderness/mass/nodules Lungs: clear to auscultation bilaterally Heart: regular rate and rhythm, S1, S2 normal, no murmur, click, rub or gallop and Healing pacemaker site left upper chest Abdomen: soft, non-tender; bowel sounds normal; no masses,  no organomegaly Extremities: extremities normal, atraumatic, no cyanosis or edema Pulses: 2+ and symmetric Skin: Skin color, texture, turgor normal. No rashes or lesions Neurologic: Grossly normal Psych: Pleasant  EKG: Ventricular paced rhythm 75-personally reviewed  ASSESSMENT: 1. Persistent atrial fibrillation/flutter with slow ventricular response - s/p Medtronic PPM (09/2016) 2. LBBB 3. Diastolic  congestive heart failure 4. CKD 3A 5. CHADSVASC score 4-anticoagulated on Eliquis 2.5 mg twice a day 6. Dyslipidemia-goal LDL less than 70  PLAN: 1.   Mr. Temme has history of diastolic heart failure, chronic kidney disease, persistent A.  fib on anticoagulation and left bundle branch block with presumed underlying coronary disease although no prior events.  His goal LDL is less than 70 and may need additional treatment.  I recommended we restarted his atorvastatin 20 mg or at least we sent in a renewal prescription for him because he may be taking it.  He may need to be on higher doses.  He is doing well with his pacemaker.  He denies any worsening heart failure.  He rarely has to take additional Lasix.  Weight is been stable.  He is just overall very cautious about falling and slowing down somewhat.  Follow-up with me annually or sooner as necessary.  Pixie Casino, MD, [facility], [place] Director of the Advanced Lipid Disorders &  Cardiovascular Risk Reduction Clinic Diplomate of the American Board of Clinical Lipidology Attending Cardiologist  Direct Dial: [phone]  Fax: [phone]  Website:  [email or web address]  Nadean Corwin  05/11/2018, 8:22 AM

## 2018-05-11 NOTE — Patient Instructions (Signed)
Medication Instructions:  Your physician has recommended you make the following change in your medication:   START ATORVASTATIN (LIPITOR) 20 MG DAILY  If you need a refill on your cardiac medications before your next appointment, please call your pharmacy.   Lab work: NONE If you have labs (blood work) drawn today and your tests are completely normal, you will receive your results only by: Marland Kitchen MyChart Message (if you have MyChart) OR . A paper copy in the mail If you have any lab test that is abnormal or we need to change your treatment, we will call you to review the results.  Testing/Procedures: NONE  Follow-Up: At [facility], you and your health needs are our priority.  As part of our continuing mission to provide you with exceptional heart care, we have created designated Provider Care Teams.  These Care Teams include your primary Cardiologist (physician) and Advanced Practice Providers (APPs -  Physician Assistants and Nurse Practitioners) who all work together to provide you with the care you need, when you need it. You will need a follow up appointment in 12 months with Dr. Debara Pickett.  Please call our office 2 months in advance to schedule this appointment.

## 2018-05-26 DIAGNOSIS — H26493 Other secondary cataract, bilateral: Secondary | ICD-10-CM | POA: Diagnosis not present

## 2018-05-26 DIAGNOSIS — E119 Type 2 diabetes mellitus without complications: Secondary | ICD-10-CM | POA: Diagnosis not present

## 2018-05-26 DIAGNOSIS — Z961 Presence of intraocular lens: Secondary | ICD-10-CM | POA: Diagnosis not present

## 2018-05-26 DIAGNOSIS — H353131 Nonexudative age-related macular degeneration, bilateral, early dry stage: Secondary | ICD-10-CM | POA: Diagnosis not present

## 2018-05-26 LAB — HM DIABETES EYE EXAM

## 2018-05-29 ENCOUNTER — Telehealth: Payer: Self-pay | Admitting: *Deleted

## 2018-05-29 NOTE — Telephone Encounter (Signed)
Received Diabetic Eye Exam Report from [facility]; forwarded to provider/SLS 3/13

## 2018-06-01 ENCOUNTER — Telehealth: Payer: Self-pay | Admitting: *Deleted

## 2018-06-01 ENCOUNTER — Other Ambulatory Visit: Payer: Self-pay | Admitting: Family Medicine

## 2018-06-01 NOTE — Telephone Encounter (Signed)
Received Diabetic Eye Exam Report from [facility]; forwarded to provider/SLS 03/16

## 2018-06-03 ENCOUNTER — Telehealth: Payer: Self-pay

## 2018-06-03 NOTE — Telephone Encounter (Signed)
Called pt to inform that we received a pharmacy requesting stating Losartan is on backorder. Informed pt that per Dr. Debara Pickett, pt is ok to switch to Irbesartan 150 mg daily instead. Pt verbalized that the pharmacy now have Losartan in stock and he just picked up his new prescription. Informed pt to continue with current medication regimen.

## 2018-06-05 ENCOUNTER — Encounter: Payer: Self-pay | Admitting: Family Medicine

## 2018-06-16 ENCOUNTER — Other Ambulatory Visit: Payer: Self-pay

## 2018-06-16 ENCOUNTER — Encounter: Payer: Self-pay | Admitting: Podiatry

## 2018-06-16 ENCOUNTER — Ambulatory Visit: Payer: PPO | Admitting: Podiatry

## 2018-06-16 VITALS — Temp 97.6°F

## 2018-06-16 DIAGNOSIS — M79674 Pain in right toe(s): Secondary | ICD-10-CM | POA: Diagnosis not present

## 2018-06-16 DIAGNOSIS — B351 Tinea unguium: Secondary | ICD-10-CM | POA: Diagnosis not present

## 2018-06-16 DIAGNOSIS — M79675 Pain in left toe(s): Secondary | ICD-10-CM | POA: Diagnosis not present

## 2018-06-16 NOTE — Progress Notes (Signed)
Subjective: Vincent Allen presents today for preventative diabetic foot care with painful, thick toenails 1-5 b/l that he cannot cut and which interfere with daily activities.  Pain is aggravated when wearing enclosed shoe gear and relieved with periodic professional debridement.  [name], MD is his PCP.    Current Outpatient Medications:  .  amLODipine (NORVASC) 5 MG tablet, TAKE 1 TABLET BY MOUTH EVERY DAY, Disp: 90 tablet, Rfl: 1 .  atorvastatin (LIPITOR) 20 MG tablet, Take 1 tablet (20 mg total) by mouth daily., Disp: 90 tablet, Rfl: 3 .  Calcium 600 MG tablet, Take 1 tablet (600 mg total) by mouth 2 (two) times daily., Disp: 60 tablet, Rfl: 0 .  ELIQUIS 2.5 MG TABS tablet, TAKE 1 TABLET (2.5 MG TOTAL) BY MOUTH 2 (TWO) TIMES DAILY., Disp: 60 tablet, Rfl: 2 .  furosemide (LASIX) 20 MG tablet, TAKE 1 TABLET (20 MG TOTAL) BY MOUTH DAILY AS NEEDED FOR EDEMA (WEIGHT GAIN >3LBS IN 24 HRS, SOB)., Disp: 30 tablet, Rfl: 2 .  glipiZIDE (GLUCOTROL) 10 MG tablet, TAKE 2 TABLETS BY MOUTH TWICE DAILY BEFORE MEALS, Disp: 360 tablet, Rfl: 0 .  glucose blood (ONE TOUCH ULTRA TEST) test strip, USE 1 STRIP 2 TIMES DAILY TO CHECK BLOOD SUGAR, Disp: 100 each, Rfl: 0 .  JANUVIA 100 MG tablet, TAKE 1/2 TO 1 TABLET DAILY, Disp: 30 tablet, Rfl: 5 .  Lancets (ONETOUCH ULTRASOFT) lancets, Use as instructed to check blood sugar twice a day as needed Dx Code E11.9, Disp: 100 each, Rfl: 6 .  losartan (COZAAR) 50 MG tablet, TAKE 1 TABLET BY MOUTH EVERY DAY, Disp: 90 tablet, Rfl: 2 .  meclizine (ANTIVERT) 25 MG tablet, Take 1 tablet (25 mg total) by mouth 3 (three) times daily as needed for dizziness., Disp: 30 tablet, Rfl: 0 .  metFORMIN (GLUCOPHAGE-XR) 500 MG 24 hr tablet, Take 1 tablet (500 mg total) by mouth daily with breakfast., Disp: 180 tablet, Rfl: 1 .  neomycin-polymyxin-hydrocortisone (CORTISPORIN) OTIC solution, Place 4 drops into both ears 3 (three) times daily., Disp: 10 mL, Rfl: 0 .   triamterene-hydrochlorothiazide (MAXZIDE) 75-50 MG tablet, TAKE 1 TABLET BY MOUTH DAILY., Disp: 90 tablet, Rfl: 1  Allergies  Allergen Reactions  . Penicillins Hives and Itching    Has patient had a PCN reaction causing immediate rash, facial/tongue/throat swelling, SOB or lightheadedness with hypotension:Yes Has patient had a PCN reaction causing severe rash involving mucus membranes or skin necrosis:No Has patient had a PCN reaction that required hospitalization:No Has patient had a PCN reaction occurring within the last 10 years:No If all of the above answers are "NO", then may proceed with Cephalosporin use.   . Verapamil     Junctional rhythm  . Influenza Vaccines Rash    Objective:  Vascular Examination: Capillary refill time <3 seconds  x 10 digits  Dorsalis pedis and Posterior tibial pulses palpable b/l  Digital hair absent x 10 digits  Skin temperature gradient WNL b/l  No calf swelling nor pain on compression b/l LE  Dermatological Examination: Skin with normal turgor, texture and tone b/l  Toenails 1-5 b/l discolored, thick, dystrophic with subungual debris and pain with palpation to nailbeds due to thickness of nails.  No open wounds.  Musculoskeletal: Muscle strength 5/5 to all LE muscle groups  No gross bony deformities b/l.  No pain, crepitus or joint limitation noted with ROM.   Neurological: Sensation intact with 10 gram monofilament.  Vibratory sensation intact.  Assessment: Painful onychomycosis toenails  1-5 b/l   Plan: 1. Toenails 1-5 b/l were debrided in length and girth without iatrogenic bleeding. 2. Patient to continue soft, supportive shoe gear daily. 3. Patient to report any pedal injuries to medical professional immediately. 4. Follow up 3 months.  5. Patient/POA to call should there be a concern in the interim.

## 2018-06-20 ENCOUNTER — Other Ambulatory Visit: Payer: Self-pay | Admitting: Family Medicine

## 2018-06-20 MED ORDER — GLUCOSE BLOOD VI STRP: ORAL_STRIP | 1 refills | Status: DC

## 2018-07-01 ENCOUNTER — Other Ambulatory Visit: Payer: Self-pay

## 2018-07-01 ENCOUNTER — Ambulatory Visit (INDEPENDENT_AMBULATORY_CARE_PROVIDER_SITE_OTHER): Payer: PPO | Admitting: *Deleted

## 2018-07-01 DIAGNOSIS — I4821 Permanent atrial fibrillation: Secondary | ICD-10-CM

## 2018-07-01 DIAGNOSIS — I495 Sick sinus syndrome: Secondary | ICD-10-CM

## 2018-07-01 LAB — CUP PACEART REMOTE DEVICE CHECK
Battery Remaining Longevity: 134 mo
Battery Voltage: 3.01 V
Brady Statistic AP VP Percent: 0 %
Brady Statistic AP VS Percent: 0 %
Brady Statistic AS VP Percent: 0 %
Brady Statistic AS VS Percent: 0 %
Brady Statistic RA Percent Paced: 0.16 %
Brady Statistic RV Percent Paced: 98.4 %
Date Time Interrogation Session: 20200415111734
Implantable Lead Implant Date: 20180706
Implantable Lead Implant Date: 20180706
Implantable Lead Location: 753859
Implantable Lead Location: 753860
Implantable Lead Model: 5076
Implantable Lead Model: 5076
Implantable Pulse Generator Implant Date: 20180706
Lead Channel Impedance Value: 323 Ohm
Lead Channel Impedance Value: 551 Ohm
Lead Channel Impedance Value: 551 Ohm
Lead Channel Impedance Value: 741 Ohm
Lead Channel Pacing Threshold Amplitude: 0.625 V
Lead Channel Pacing Threshold Pulse Width: 0.4 ms
Lead Channel Sensing Intrinsic Amplitude: 1.5 mV
Lead Channel Sensing Intrinsic Amplitude: 1.5 mV
Lead Channel Sensing Intrinsic Amplitude: 9.375 mV
Lead Channel Sensing Intrinsic Amplitude: 9.375 mV
Lead Channel Setting Pacing Amplitude: 2 V
Lead Channel Setting Pacing Amplitude: 2.5 V
Lead Channel Setting Pacing Pulse Width: 0.4 ms
Lead Channel Setting Sensing Sensitivity: 2.8 mV

## 2018-07-02 ENCOUNTER — Other Ambulatory Visit: Payer: PPO

## 2018-07-08 ENCOUNTER — Other Ambulatory Visit: Payer: Self-pay | Admitting: Family Medicine

## 2018-07-08 NOTE — Progress Notes (Signed)
Remote pacemaker transmission.   

## 2018-07-13 ENCOUNTER — Ambulatory Visit: Payer: PPO | Admitting: Family Medicine

## 2018-07-19 ENCOUNTER — Encounter: Payer: Self-pay | Admitting: Family Medicine

## 2018-07-19 DIAGNOSIS — I4819 Other persistent atrial fibrillation: Secondary | ICD-10-CM

## 2018-07-20 MED ORDER — APIXABAN 2.5 MG PO TABS: 2.5000 mg | ORAL_TABLET | Freq: Two times a day (BID) | ORAL | 5 refills | Status: DC

## 2018-08-04 ENCOUNTER — Other Ambulatory Visit: Payer: Self-pay

## 2018-08-04 ENCOUNTER — Ambulatory Visit (INDEPENDENT_AMBULATORY_CARE_PROVIDER_SITE_OTHER): Payer: PPO | Admitting: Family Medicine

## 2018-08-04 DIAGNOSIS — E559 Vitamin D deficiency, unspecified: Secondary | ICD-10-CM

## 2018-08-04 DIAGNOSIS — E669 Obesity, unspecified: Secondary | ICD-10-CM | POA: Diagnosis not present

## 2018-08-04 DIAGNOSIS — I4819 Other persistent atrial fibrillation: Secondary | ICD-10-CM

## 2018-08-04 DIAGNOSIS — I10 Essential (primary) hypertension: Secondary | ICD-10-CM

## 2018-08-04 DIAGNOSIS — E1169 Type 2 diabetes mellitus with other specified complication: Secondary | ICD-10-CM | POA: Diagnosis not present

## 2018-08-04 NOTE — Assessment & Plan Note (Signed)
hgba1c acceptable, minimize simple carbs. Increase exercise as tolerated.  

## 2018-08-04 NOTE — Assessment & Plan Note (Signed)
No changes to meds. Encouraged heart healthy diet such as the DASH diet and exercise as tolerated. Consider getting an Omron.

## 2018-08-04 NOTE — Assessment & Plan Note (Signed)
Asymptomatic, tolerating Eliquis

## 2018-08-04 NOTE — Progress Notes (Signed)
Virtual Visit via Telephone Note  I connected with Vincent Allen on 08/04/18 at  8:40 AM EDT by a telephone enabled telemedicine application and verified that I am speaking with the correct person using two identifiers.  Location: Patient: home Provider: home   I discussed the limitations of evaluation and management by telemedicine and the availability of in person appointments. The patient expressed understanding and agreed to proceed. Alinda Dooms, CMA attempted to get patient set up on video visit but was unable so visit completed on telephone    Subjective:    Patient ID: Vincent Allen, male    DOB: [birth year], 85 y.o.   MRN: [id number]  Chief Complaint  Patient presents with  . Hypertension  . Diabetes  . Hyperlipidemia  . Follow-up    HPI Patient is in today for follow-up on chronic medical concerns including hypertension, diabetes, hyperlipidemia and renal insufficiency.  He reports he feels well.  No recent febrile illness or hospitalizations.  He is managing the pandemic well and staying in.  He goes out infrequently to the grocery store but mostly his family brings him his food.  He offers no acute concerns and feels that other than boredom he is managing this well.  No polyuria or polydipsia and he reports his blood sugar has been well controlled. Denies CP/palp/SOB/HA/congestion/fevers/GI or GU c/o. Taking meds as prescribed  Past Medical History:  Diagnosis Date  . Arthritis   . Bradycardia   . Cataract   . Chronic kidney disease stage III (GFR 30-59 ml/min) 05/03/2012  . Colon polyps   . Complication of anesthesia    emesis  . Diabetes mellitus   . GERD (gastroesophageal reflux disease)   . HOH (hard of hearing)   . HTN (hypertension) 01/13/2013  . Hyperlipidemia   . Hypertension   . Pacemaker 12/2016  . Persistent atrial fibrillation   . Presence of permanent cardiac pacemaker 09/20/2016  . Symptomatic bradycardia   . Vitamin D deficiency  07/17/2015    Past Surgical History:  Procedure Laterality Date  . ANKLE ARTHROPLASTY    . CARDIOVERSION N/A 08/20/2016   Procedure: CARDIOVERSION;  Surgeon: Sanda Klein, MD;  Location: [facility];  Service: Cardiovascular;  Laterality: N/A;  . CATARACT EXTRACTION  2010, 2014  . CATARACT EXTRACTION  02/28/14  . CHOLECYSTECTOMY    . CHOLECYSTECTOMY N/A 05/03/2012   Procedure: LAPAROSCOPIC CHOLECYSTECTOMY WITH INTRAOPERATIVE CHOLANGIOGRAM;  Surgeon: [name], MD;  Location: [facility];  Service: General;  Laterality: N/A;  . INSERT / REPLACE / REMOVE PACEMAKER  09/20/2016  . PACEMAKER IMPLANT N/A 09/20/2016   Procedure: Pacemaker Implant;  Surgeon: Deboraha Sprang, MD;  Location: [facility];  Service: Cardiovascular;  Laterality: N/A;  . ROTATOR CUFF REPAIR    . SHOULDER SURGERY    . TONSILLECTOMY      Family History  Problem Relation Age of Onset  . Cancer Mother        colon, breast, pancreas, skin cancer  . Heart disease Father        heart valve replaced  . Stroke Father   . Diabetes Father   . Cancer Paternal Grandmother        colon  . Obesity Son   . Heart disease Son        bradycardia    Social History   Socioeconomic History  . Marital status: Widowed    Spouse name: Not on file  . Number of children: Not on file  .  Years of education: Not on file  . Highest education level: Not on file  Occupational History  . Not on file  Social Needs  . Financial resource strain: Not on file  . Food insecurity:    Worry: Not on file    Inability: Not on file  . Transportation needs:    Medical: Not on file    Non-medical: Not on file  Tobacco Use  . Smoking status: Never Smoker  . Smokeless tobacco: Never Used  Substance and Sexual Activity  . Alcohol use: Yes    Alcohol/week: 1.0 standard drinks    Types: 1 Glasses of wine per week  . Drug use: No  . 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.  Lifestyle  . Physical activity:    Days per week: Not on file    Minutes per session: Not on file  . Stress: Not on file  Relationships  . Social connections:    Talks on phone: Not on file    Gets together: Not on file    Attends religious service: Not on file    Active member of club or organization: Not on file    Attends meetings of clubs or organizations: Not on file    Relationship status: Not on file  . Intimate partner violence:    Fear of current or ex partner: Not on file    Emotionally abused: Not on file    Physically abused: Not on file    Forced sexual activity: Not on file  Other Topics Concern  . Not on file  Social History Narrative  . Not on file    Outpatient Medications Prior to Visit  Medication Sig Dispense Refill  . amLODipine (NORVASC) 5 MG tablet TAKE 1 TABLET BY MOUTH EVERY DAY 90 tablet 1  . apixaban (ELIQUIS) 2.5 MG TABS tablet Take 1 tablet (2.5 mg total) by mouth 2 (two) times daily. 60 tablet 5  . atorvastatin (LIPITOR) 20 MG tablet Take 1 tablet (20 mg total) by mouth daily. 90 tablet 3  . Calcium 600 MG tablet Take 1 tablet (600 mg total) by mouth 2 (two) times daily. 60 tablet 0  . furosemide (LASIX) 20 MG tablet TAKE 1 TABLET (20 MG TOTAL) BY MOUTH DAILY AS NEEDED FOR EDEMA (WEIGHT GAIN >3LBS IN 24 HRS, SOB). 30 tablet 2  . glipiZIDE (GLUCOTROL) 10 MG tablet TAKE 2 TABLETS BY MOUTH TWICE DAILY BEFORE MEALS 360 tablet 0  . glucose blood (ONE TOUCH ULTRA TEST) test strip USE 1 STRIP 2 TIMES DAILY TO CHECK BLOOD SUGAR DX E08.22, N18.3 300 each 1  . JANUVIA 100 MG tablet TAKE 1/2 TO 1 TABLET DAILY 30 tablet 5  . Lancets (ONETOUCH ULTRASOFT) lancets Use as instructed to check blood sugar twice a day as needed Dx Code E11.9 100 each 6  . losartan (COZAAR) 50 MG tablet TAKE 1 TABLET BY MOUTH EVERY DAY 90 tablet 2  . meclizine (ANTIVERT) 25 MG tablet Take 1 tablet (25 mg total) by mouth 3 (three) times  daily as needed for dizziness. 30 tablet 0  . metFORMIN (GLUCOPHAGE-XR) 500 MG 24 hr tablet Take 1 tablet (500 mg total) by mouth daily with breakfast. 180 tablet 1  . neomycin-polymyxin-hydrocortisone (CORTISPORIN) OTIC solution Place 4 drops into both ears 3 (three) times daily. 10 mL 0  . triamterene-hydrochlorothiazide (MAXZIDE) 75-50 MG tablet TAKE  1 TABLET BY MOUTH DAILY. 90 tablet 1   No facility-administered medications prior to visit.     Allergies  Allergen Reactions  . Penicillins Hives and Itching    Has patient had a PCN reaction causing immediate rash, facial/tongue/throat swelling, SOB or lightheadedness with hypotension:Yes Has patient had a PCN reaction causing severe rash involving mucus membranes or skin necrosis:No Has patient had a PCN reaction that required hospitalization:No Has patient had a PCN reaction occurring within the last 10 years:No If all of the above answers are "NO", then may proceed with Cephalosporin use.   . Verapamil     Junctional rhythm  . Influenza Vaccines Rash    Review of Systems  Constitutional: Negative for fever and malaise/fatigue.  HENT: Negative for congestion.   Eyes: Negative for blurred vision.  Respiratory: Negative for shortness of breath.   Cardiovascular: Negative for chest pain, palpitations and leg swelling.  Gastrointestinal: Negative for abdominal pain, blood in stool and nausea.  Genitourinary: Negative for dysuria and frequency.  Musculoskeletal: Negative for falls.  Skin: Negative for rash.  Neurological: Negative for dizziness, loss of consciousness and headaches.  Endo/Heme/Allergies: Negative for environmental allergies.  Psychiatric/Behavioral: Negative for depression. The patient is not nervous/anxious.        Objective:    Physical Exam Unable to complete  There were no vitals taken for this visit. Wt Readings from Last 3 Encounters:  05/11/18 223 lb (101.2 kg)  03/23/18 222 lb 2 oz (100.8 kg)   12/16/17 219 lb 9.6 oz (99.6 kg)    Diabetic Foot Exam - Simple   No data filed     Lab Results  Component Value Date   WBC 7.3 04/23/2018   HGB 15.7 04/23/2018   HCT 47.7 04/23/2018   PLT 180.0 04/23/2018   GLUCOSE 119 (H) 04/23/2018   CHOL 171 04/23/2018   TRIG 182.0 (H) 04/23/2018   HDL 34.30 (L) 04/23/2018   LDLDIRECT 70.8 03/14/2014   LDLCALC 100 (H) 04/23/2018   ALT 17 04/23/2018   AST 14 04/23/2018   NA 138 04/23/2018   K 4.0 04/23/2018   CL 102 04/23/2018   CREATININE 1.52 (H) 04/23/2018   BUN 23 04/23/2018   CO2 25 04/23/2018   TSH 3.16 04/23/2018   PSA 1.18 10/07/2012   INR 1.0 08/15/2016   HGBA1C 7.1 (H) 04/23/2018   MICROALBUR 26.7 (H) 07/15/2016    Lab Results  Component Value Date   TSH 3.16 04/23/2018   Lab Results  Component Value Date   WBC 7.3 04/23/2018   HGB 15.7 04/23/2018   HCT 47.7 04/23/2018   MCV 84.7 04/23/2018   PLT 180.0 04/23/2018   Lab Results  Component Value Date   NA 138 04/23/2018   K 4.0 04/23/2018   CO2 25 04/23/2018   GLUCOSE 119 (H) 04/23/2018   BUN 23 04/23/2018   CREATININE 1.52 (H) 04/23/2018   BILITOT 0.9 04/23/2018   ALKPHOS 48 04/23/2018   AST 14 04/23/2018   ALT 17 04/23/2018   PROT 6.6 04/23/2018   ALBUMIN 3.8 04/23/2018   CALCIUM 10.0 04/23/2018   ANIONGAP 8 07/10/2016   GFR 44.04 (L) 04/23/2018   Lab Results  Component Value Date   CHOL 171 04/23/2018   Lab Results  Component Value Date   HDL 34.30 (L) 04/23/2018   Lab Results  Component Value Date   LDLCALC 100 (H) 04/23/2018   Lab Results  Component Value Date   TRIG 182.0 (H) 04/23/2018  Lab Results  Component Value Date   CHOLHDL 5 04/23/2018   Lab Results  Component Value Date   HGBA1C 7.1 (H) 04/23/2018       Assessment & Plan:   Problem List Items Addressed This Visit    Essential hypertension     No changes to meds. Encouraged heart healthy diet such as the DASH diet and exercise as tolerated. Consider getting an  Omron.      Diabetes mellitus type 2 in obese (HCC)    hgba1c acceptable, minimize simple carbs. Increase exercise as tolerated.        Vitamin D deficiency    Supplement and monitor      Persistent atrial fibrillation    Asymptomatic, tolerating Eliquis         I am having Vincent C. Alroy Dust "[name]" maintain his calcium carbonate, onetouch ultrasoft, losartan, meclizine, neomycin-polymyxin-hydrocortisone, furosemide, metFORMIN, triamterene-hydrochlorothiazide, Januvia, atorvastatin, glucose blood, amLODipine, glipiZIDE, and apixaban.  No orders of the defined types were placed in this encounter.  I discussed the assessment and treatment plan with the patient. The patient was provided an opportunity to ask questions and all were answered. The patient agreed with the plan and demonstrated an understanding of the instructions.   The patient was advised to call back or seek an in-person evaluation if the symptoms worsen or if the condition fails to improve as anticipated.  I provided 25 minutes of non-face-to-face time during this encounter.   [name], MD

## 2018-08-04 NOTE — Assessment & Plan Note (Signed)
Supplement and monitor 

## 2018-08-28 ENCOUNTER — Other Ambulatory Visit: Payer: Self-pay | Admitting: Internal Medicine

## 2018-09-04 ENCOUNTER — Other Ambulatory Visit: Payer: Self-pay

## 2018-09-04 MED ORDER — LOSARTAN POTASSIUM 50 MG PO TABS: 50.0000 mg | ORAL_TABLET | Freq: Every day | ORAL | 2 refills | Status: DC

## 2018-09-04 NOTE — Telephone Encounter (Signed)
Rx(s) sent to pharmacy electronically.  

## 2018-09-15 ENCOUNTER — Encounter: Payer: Self-pay | Admitting: Podiatry

## 2018-09-15 ENCOUNTER — Ambulatory Visit: Payer: PPO | Admitting: Podiatry

## 2018-09-15 ENCOUNTER — Other Ambulatory Visit: Payer: Self-pay

## 2018-09-15 VITALS — Temp 98.0°F

## 2018-09-15 DIAGNOSIS — M79675 Pain in left toe(s): Secondary | ICD-10-CM | POA: Diagnosis not present

## 2018-09-15 DIAGNOSIS — B351 Tinea unguium: Secondary | ICD-10-CM | POA: Diagnosis not present

## 2018-09-15 DIAGNOSIS — M79674 Pain in right toe(s): Secondary | ICD-10-CM

## 2018-09-15 NOTE — Patient Instructions (Signed)
Diabetes Mellitus and Foot Care Foot care is an important part of your health, especially when you have diabetes. Diabetes may cause you to have problems because of poor blood flow (circulation) to your feet and legs, which can cause your skin to:  Become thinner and drier.  Break more easily.  Heal more slowly.  Peel and crack. You may also have nerve damage (neuropathy) in your legs and feet, causing decreased feeling in them. This means that you may not notice minor injuries to your feet that could lead to more serious problems. Noticing and addressing any potential problems early is the best way to prevent future foot problems. How to care for your feet Foot hygiene  Wash your feet daily with warm water and mild soap. Do not use hot water. Then, pat your feet and the areas between your toes until they are completely dry. Do not soak your feet as this can dry your skin.  Trim your toenails straight across. Do not dig under them or around the cuticle. File the edges of your nails with an emery board or nail file.  Apply a moisturizing lotion or petroleum jelly to the skin on your feet and to dry, brittle toenails. Use lotion that does not contain alcohol and is unscented. Do not apply lotion between your toes. Shoes and socks  Wear clean socks or stockings every day. Make sure they are not too tight. Do not wear knee-high stockings since they may decrease blood flow to your legs.  Wear shoes that fit properly and have enough cushioning. Always look in your shoes before you put them on to be sure there are no objects inside.  To break in new shoes, wear them for just a few hours a day. This prevents injuries on your feet. Wounds, scrapes, corns, and calluses  Check your feet daily for blisters, cuts, bruises, sores, and redness. If you cannot see the bottom of your feet, use a mirror or ask someone for help.  Do not cut corns or calluses or try to remove them with medicine.  If you  find a minor scrape, cut, or break in the skin on your feet, keep it and the skin around it clean and dry. You may clean these areas with mild soap and water. Do not clean the area with peroxide, alcohol, or iodine.  If you have a wound, scrape, corn, or callus on your foot, look at it several times a day to make sure it is healing and not infected. Check for: ? Redness, swelling, or pain. ? Fluid or blood. ? Warmth. ? Pus or a bad smell. General instructions  Do not cross your legs. This may decrease blood flow to your feet.  Do not use heating pads or hot water bottles on your feet. They may burn your skin. If you have lost feeling in your feet or legs, you may not know this is happening until it is too late.  Protect your feet from hot and cold by wearing shoes, such as at the beach or on hot pavement.  Schedule a complete foot exam at least once a year (annually) or more often if you have foot problems. If you have foot problems, report any cuts, sores, or bruises to your health care provider immediately. Contact a health care provider if:  You have a medical condition that increases your risk of infection and you have any cuts, sores, or bruises on your feet.  You have an injury that is not   healing.  You have redness on your legs or feet.  You feel burning or tingling in your legs or feet.  You have pain or cramps in your legs and feet.  Your legs or feet are numb.  Your feet always feel cold.  You have pain around a toenail. Get help right away if:  You have a wound, scrape, corn, or callus on your foot and: ? You have pain, swelling, or redness that gets worse. ? You have fluid or blood coming from the wound, scrape, corn, or callus. ? Your wound, scrape, corn, or callus feels warm to the touch. ? You have pus or a bad smell coming from the wound, scrape, corn, or callus. ? You have a fever. ? You have a red line going up your leg. Summary  Check your feet every day  for cuts, sores, red spots, swelling, and blisters.  Moisturize feet and legs daily.  Wear shoes that fit properly and have enough cushioning.  If you have foot problems, report any cuts, sores, or bruises to your health care provider immediately.  Schedule a complete foot exam at least once a year (annually) or more often if you have foot problems. This information is not intended to replace advice given to you by your health care provider. Make sure you discuss any questions you have with your health care provider. Document Released: 03/01/2000 Document Revised: 04/16/2017 Document Reviewed: 04/05/2016 Elsevier Patient Education  2020 [employer]  

## 2018-09-19 NOTE — Progress Notes (Signed)
Subjective:  Vincent Allen presents to clinic today with cc of  painful, thick, discolored, elongated toenails 1-5 b/l that become tender and cannot cut because of thickness. Pain is aggravated when wearing enclosed shoe gear.  He voices no new pedal concerns on today's visit.   Current Outpatient Medications:  .  amLODipine (NORVASC) 5 MG tablet, TAKE 1 TABLET BY MOUTH EVERY DAY, Disp: 90 tablet, Rfl: 1 .  apixaban (ELIQUIS) 2.5 MG TABS tablet, Take 1 tablet (2.5 mg total) by mouth 2 (two) times daily., Disp: 60 tablet, Rfl: 5 .  Calcium 600 MG tablet, Take 1 tablet (600 mg total) by mouth 2 (two) times daily., Disp: 60 tablet, Rfl: 0 .  furosemide (LASIX) 20 MG tablet, TAKE 1 TABLET (20 MG TOTAL) BY MOUTH DAILY AS NEEDED FOR EDEMA (WEIGHT GAIN >3LBS IN 24 HRS, SOB)., Disp: 30 tablet, Rfl: 2 .  glipiZIDE (GLUCOTROL) 10 MG tablet, TAKE 2 TABLETS BY MOUTH TWICE DAILY BEFORE MEALS, Disp: 360 tablet, Rfl: 0 .  glucose blood (ONE TOUCH ULTRA TEST) test strip, USE 1 STRIP 2 TIMES DAILY TO CHECK BLOOD SUGAR DX E08.22, N18.3, Disp: 300 each, Rfl: 1 .  JANUVIA 100 MG tablet, TAKE 1/2 TO 1 TABLET DAILY, Disp: 30 tablet, Rfl: 5 .  Lancets (ONETOUCH ULTRASOFT) lancets, Use as instructed to check blood sugar twice a day as needed Dx Code E11.9, Disp: 100 each, Rfl: 6 .  losartan (COZAAR) 25 MG tablet, Take 50 mg by mouth daily., Disp: , Rfl:  .  losartan (COZAAR) 50 MG tablet, Take 1 tablet (50 mg total) by mouth daily., Disp: 90 tablet, Rfl: 2 .  meclizine (ANTIVERT) 25 MG tablet, Take 1 tablet (25 mg total) by mouth 3 (three) times daily as needed for dizziness., Disp: 30 tablet, Rfl: 0 .  metFORMIN (GLUCOPHAGE-XR) 500 MG 24 hr tablet, Take 1 tablet (500 mg total) by mouth daily with breakfast., Disp: 180 tablet, Rfl: 1 .  neomycin-polymyxin-hydrocortisone (CORTISPORIN) OTIC solution, Place 4 drops into both ears 3 (three) times daily., Disp: 10 mL, Rfl: 0 .  triamterene-hydrochlorothiazide (MAXZIDE)  75-50 MG tablet, TAKE 1 TABLET BY MOUTH DAILY., Disp: 90 tablet, Rfl: 1 .  atorvastatin (LIPITOR) 20 MG tablet, Take 1 tablet (20 mg total) by mouth daily., Disp: 90 tablet, Rfl: 3   Allergies  Allergen Reactions  . Penicillins Hives and Itching    Has patient had a PCN reaction causing immediate rash, facial/tongue/throat swelling, SOB or lightheadedness with hypotension:Yes Has patient had a PCN reaction causing severe rash involving mucus membranes or skin necrosis:No Has patient had a PCN reaction that required hospitalization:No Has patient had a PCN reaction occurring within the last 10 years:No If all of the above answers are "NO", then may proceed with Cephalosporin use.   . Verapamil     Junctional rhythm  . Influenza Vaccines Rash     Objective: Vitals:   09/15/18 0817  Temp: 98 F (36.7 C)    Physical Examination:  Vascular Examination: Capillary refill time <3 seconds x 10 digits.  Palpable DP/PT pulses b/l.  Digital hair absent b/l.  No edema noted b/l.  Skin temperature gradient WNL b/l.  Dermatological Examination: Skin with normal turgor, texture and tone b/l.  No open wounds b/l.  No interdigital macerations noted b/l.  Elongated, thick, discolored brittle toenails with subungual debris and pain on dorsal palpation of nailbeds 1-5 b/l.  Musculoskeletal Examination: Muscle strength 5/5 to all muscle groups b/l.  No pain, crepitus  or joint discomfort with active/passive ROM.  Neurological Examination: Sensation intact 5/5 b/l with 10 gram monofilament.  Vibratory sensation intact b/l.  Assessment: Mycotic nail infection with pain 1-5 b/l  Plan: 1. Toenails 1-5 b/l were debrided in length and girth without iatrogenic laceration. 2.  Continue soft, supportive shoe gear daily. 3.  Report any pedal injuries to medical professional. 4.  Follow up 3 months. 5.  Patient/POA to call should there be a question/concern in there interim.

## 2018-09-21 DIAGNOSIS — L570 Actinic keratosis: Secondary | ICD-10-CM | POA: Diagnosis not present

## 2018-09-21 DIAGNOSIS — C4442 Squamous cell carcinoma of skin of scalp and neck: Secondary | ICD-10-CM | POA: Diagnosis not present

## 2018-09-21 DIAGNOSIS — Z85828 Personal history of other malignant neoplasm of skin: Secondary | ICD-10-CM | POA: Diagnosis not present

## 2018-09-21 DIAGNOSIS — D485 Neoplasm of uncertain behavior of skin: Secondary | ICD-10-CM | POA: Diagnosis not present

## 2018-09-21 DIAGNOSIS — L821 Other seborrheic keratosis: Secondary | ICD-10-CM | POA: Diagnosis not present

## 2018-09-30 ENCOUNTER — Ambulatory Visit (INDEPENDENT_AMBULATORY_CARE_PROVIDER_SITE_OTHER): Payer: PPO | Admitting: *Deleted

## 2018-09-30 DIAGNOSIS — I495 Sick sinus syndrome: Secondary | ICD-10-CM

## 2018-10-01 LAB — CUP PACEART REMOTE DEVICE CHECK
Battery Remaining Longevity: 132 mo
Battery Voltage: 3.01 V
Brady Statistic AP VP Percent: 0 %
Brady Statistic AP VS Percent: 0 %
Brady Statistic AS VP Percent: 0 %
Brady Statistic AS VS Percent: 0 %
Brady Statistic RA Percent Paced: 0.19 %
Brady Statistic RV Percent Paced: 99.24 %
Date Time Interrogation Session: 20200715203038
Implantable Lead Implant Date: 20180706
Implantable Lead Implant Date: 20180706
Implantable Lead Location: 753859
Implantable Lead Location: 753860
Implantable Lead Model: 5076
Implantable Lead Model: 5076
Implantable Pulse Generator Implant Date: 20180706
Lead Channel Impedance Value: 323 Ohm
Lead Channel Impedance Value: 532 Ohm
Lead Channel Impedance Value: 589 Ohm
Lead Channel Impedance Value: 779 Ohm
Lead Channel Pacing Threshold Amplitude: 0.5 V
Lead Channel Pacing Threshold Pulse Width: 0.4 ms
Lead Channel Sensing Intrinsic Amplitude: 1.5 mV
Lead Channel Sensing Intrinsic Amplitude: 12 mV
Lead Channel Setting Pacing Amplitude: 2 V
Lead Channel Setting Pacing Amplitude: 2.5 V
Lead Channel Setting Pacing Pulse Width: 0.4 ms
Lead Channel Setting Sensing Sensitivity: 2.8 mV

## 2018-10-05 ENCOUNTER — Other Ambulatory Visit: Payer: Self-pay | Admitting: Family Medicine

## 2018-10-06 DIAGNOSIS — Z85828 Personal history of other malignant neoplasm of skin: Secondary | ICD-10-CM | POA: Diagnosis not present

## 2018-10-06 DIAGNOSIS — C4442 Squamous cell carcinoma of skin of scalp and neck: Secondary | ICD-10-CM | POA: Diagnosis not present

## 2018-10-06 DIAGNOSIS — L988 Other specified disorders of the skin and subcutaneous tissue: Secondary | ICD-10-CM | POA: Diagnosis not present

## 2018-10-10 ENCOUNTER — Encounter: Payer: Self-pay | Admitting: Cardiology

## 2018-10-10 NOTE — Progress Notes (Signed)
Remote pacemaker transmission.   

## 2018-10-28 ENCOUNTER — Ambulatory Visit (INDEPENDENT_AMBULATORY_CARE_PROVIDER_SITE_OTHER): Payer: PPO | Admitting: Student

## 2018-10-28 ENCOUNTER — Other Ambulatory Visit: Payer: Self-pay

## 2018-10-28 VITALS — BP 136/72 | HR 91 | Ht 72.0 in | Wt 225.0 lb

## 2018-10-28 DIAGNOSIS — I4821 Permanent atrial fibrillation: Secondary | ICD-10-CM | POA: Diagnosis not present

## 2018-10-28 DIAGNOSIS — R42 Dizziness and giddiness: Secondary | ICD-10-CM

## 2018-10-28 DIAGNOSIS — I5032 Chronic diastolic (congestive) heart failure: Secondary | ICD-10-CM | POA: Diagnosis not present

## 2018-10-28 DIAGNOSIS — I495 Sick sinus syndrome: Secondary | ICD-10-CM

## 2018-10-28 LAB — CUP PACEART INCLINIC DEVICE CHECK
Battery Remaining Longevity: 133 mo
Battery Voltage: 3.01 V
Brady Statistic AP VP Percent: 0 %
Brady Statistic AP VS Percent: 0 %
Brady Statistic AS VP Percent: 0 %
Brady Statistic AS VS Percent: 0 %
Brady Statistic RA Percent Paced: 0.16 %
Brady Statistic RV Percent Paced: 99 %
Date Time Interrogation Session: 20200812084446
Implantable Lead Implant Date: 20180706
Implantable Lead Implant Date: 20180706
Implantable Lead Location: 753859
Implantable Lead Location: 753860
Implantable Lead Model: 5076
Implantable Lead Model: 5076
Implantable Pulse Generator Implant Date: 20180706
Lead Channel Impedance Value: 361 Ohm
Lead Channel Impedance Value: 627 Ohm
Lead Channel Impedance Value: 646 Ohm
Lead Channel Impedance Value: 855 Ohm
Lead Channel Pacing Threshold Amplitude: 0.5 V
Lead Channel Pacing Threshold Pulse Width: 0.4 ms
Lead Channel Sensing Intrinsic Amplitude: 1.375 mV
Lead Channel Sensing Intrinsic Amplitude: 13.125 mV
Lead Channel Sensing Intrinsic Amplitude: 13.25 mV
Lead Channel Sensing Intrinsic Amplitude: 2 mV
Lead Channel Setting Pacing Amplitude: 2 V
Lead Channel Setting Pacing Amplitude: 2.5 V
Lead Channel Setting Pacing Pulse Width: 0.4 ms
Lead Channel Setting Sensing Sensitivity: 2.8 mV

## 2018-10-28 NOTE — Patient Instructions (Signed)
Medication Instructions:  Your physician recommends that you continue on your current medications as directed. Please refer to the Current Medication list given to you today.  If you need a refill on your cardiac medications before your next appointment, please call your pharmacy.   Lab work: NONE ORDERED  TODAY    If you have labs (blood work) drawn today and your tests are completely normal, you will receive your results only by: Marland Kitchen MyChart Message (if you have MyChart) OR . A paper copy in the mail If you have any lab test that is abnormal or we need to change your treatment, we will call you to review the results.  Testing/Procedures: NONE ORDERED  TODAY    Follow-Up: At [facility], you and your health needs are our priority.  As part of our continuing mission to provide you with exceptional heart care, we have created designated Provider Care Teams.  These Care Teams include your primary Cardiologist (physician) and Advanced Practice Providers (APPs -  Physician Assistants and Nurse Practitioners) who all work together to provide you with the care you need, when you need it. You will need a follow up appointment in 1 years.  Please call our office 2 months in advance to schedule this appointment.  You may see  Dr. Caryl Comes or one of the following Advanced Practice Providers on your designated Care Team:   Joesph July PA-C  . Tommye Standard, PA-C  Any Other Special Instructions Will Be Listed Below (If Applicable).

## 2018-10-28 NOTE — Progress Notes (Signed)
Electrophysiology Office Note Date: 10/28/2018  ID:  Vincent Allen, DOB 04-16-1933, MRN [id number]  PCP: [name], MD Primary Cardiologist: No primary care provider on file. Electrophysiologist: None  CC: Pacemaker follow-up  Vincent Allen is a 85 y.o. male with history of permanent Afib, symptomatic bradycardia/SND s/p PPM, HTN, HLD, chronic diastolic CHF, and LBBB, seen today for Dr. Caryl Comes. He presents today for routine electrophysiology followup.  Since last being seen in our clinic, the patient reports doing very well.  They deny chest pain, palpitations, dyspnea, PND, orthopnea, nausea, vomiting, dizziness, syncope, edema, weight gain, or early satiety.  Device History: Medtronic Dual Chamber PPM implanted 09/20/2016 for SND   Past Medical History:  Diagnosis Date  . Arthritis   . Bradycardia   . Cataract   . Chronic kidney disease stage III (GFR 30-59 ml/min) 05/03/2012  . Colon polyps   . Complication of anesthesia    emesis  . Diabetes mellitus   . GERD (gastroesophageal reflux disease)   . HOH (hard of hearing)   . HTN (hypertension) 01/13/2013  . Hyperlipidemia   . Hypertension   . Pacemaker 12/2016  . Persistent atrial fibrillation   . Presence of permanent cardiac pacemaker 09/20/2016  . Symptomatic bradycardia   . Vitamin D deficiency 07/17/2015   Past Surgical History:  Procedure Laterality Date  . ANKLE ARTHROPLASTY    . CARDIOVERSION N/A 08/20/2016   Procedure: CARDIOVERSION;  Surgeon: Sanda Klein, MD;  Location: [facility];  Service: Cardiovascular;  Laterality: N/A;  . CATARACT EXTRACTION  2010, 2014  . CATARACT EXTRACTION  02/28/14  . CHOLECYSTECTOMY    . CHOLECYSTECTOMY N/A 05/03/2012   Procedure: LAPAROSCOPIC CHOLECYSTECTOMY WITH INTRAOPERATIVE CHOLANGIOGRAM;  Surgeon: [name], MD;  Location: [facility];  Service: General;  Laterality: N/A;  . INSERT / REPLACE / REMOVE PACEMAKER  09/20/2016  . PACEMAKER IMPLANT N/A 09/20/2016   Procedure: Pacemaker Implant;  Surgeon: Deboraha Sprang, MD;  Location: [facility];  Service: Cardiovascular;  Laterality: N/A;  . ROTATOR CUFF REPAIR    . SHOULDER SURGERY    . TONSILLECTOMY      Current Outpatient Medications  Medication Sig Dispense Refill  . amLODipine (NORVASC) 5 MG tablet TAKE 1 TABLET BY MOUTH EVERY DAY 90 tablet 1  . apixaban (ELIQUIS) 2.5 MG TABS tablet Take 1 tablet (2.5 mg total) by mouth 2 (two) times daily. 60 tablet 5  . Calcium 600 MG tablet Take 1 tablet (600 mg total) by mouth 2 (two) times daily. 60 tablet 0  . furosemide (LASIX) 20 MG tablet TAKE 1 TABLET (20 MG TOTAL) BY MOUTH DAILY AS NEEDED FOR EDEMA (WEIGHT GAIN >3LBS IN 24 HRS, SOB). 30 tablet 2  . glipiZIDE (GLUCOTROL) 10 MG tablet TAKE 2 TABLETS BY MOUTH TWICE A DAY BEFORE MEALS 360 tablet 0  . glucose blood (ONE TOUCH ULTRA TEST) test strip USE 1 STRIP 2 TIMES DAILY TO CHECK BLOOD SUGAR DX E08.22, N18.3 300 each 1  . JANUVIA 100 MG tablet TAKE 1/2 TO 1 TABLET DAILY 30 tablet 5  . Lancets (ONETOUCH ULTRASOFT) lancets Use as instructed to check blood sugar twice a day as needed Dx Code E11.9 100 each 6  . losartan (COZAAR) 25 MG tablet Take 50 mg by mouth daily.    . meclizine (ANTIVERT) 25 MG tablet Take 1 tablet (25 mg total) by mouth 3 (three) times daily as needed for dizziness. 30 tablet 0  . metFORMIN (GLUCOPHAGE-XR) 500  MG 24 hr tablet Take 1 tablet (500 mg total) by mouth daily with breakfast. 180 tablet 1  . neomycin-polymyxin-hydrocortisone (CORTISPORIN) OTIC solution Place 4 drops into both ears 3 (three) times daily. 10 mL 0  . triamterene-hydrochlorothiazide (MAXZIDE) 75-50 MG tablet TAKE 1 TABLET BY MOUTH DAILY. 90 tablet 1  . atorvastatin (LIPITOR) 20 MG tablet Take 1 tablet (20 mg total) by mouth daily. 90 tablet 3   No current facility-administered medications for this visit.     Allergies:   Penicillins, Verapamil, and Influenza vaccines   Social History: Social History    Socioeconomic History  . Marital status: Widowed    Spouse name: Not on file  . Number of children: Not on file  . Years of education: Not on file  . Highest education level: Not on file  Occupational History  . Not on file  Social Needs  . Financial resource strain: Not on file  . Food insecurity    Worry: Not on file    Inability: Not on file  . Transportation needs    Medical: Not on file    Non-medical: Not on file  Tobacco Use  . Smoking status: Never Smoker  . Smokeless tobacco: Never Used  Substance and Sexual Activity  . Alcohol use: Yes    Alcohol/week: 1.0 standard drinks    Types: 1 Glasses of wine per week  . Drug use: No  . 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.  Lifestyle  . Physical activity    Days per week: Not on file    Minutes per session: Not on file  . Stress: Not on file  Relationships  . Social Herbalist on phone: Not on file    Gets together: Not on file    Attends religious service: Not on file    Active member of club or organization: Not on file    Attends meetings of clubs or organizations: Not on file    Relationship status: Not on file  . Intimate partner violence    Fear of current or ex partner: Not on file    Emotionally abused: Not on file    Physically abused: Not on file    Forced sexual activity: Not on file  Other Topics Concern  . Not on file  Social History Narrative  . Not on file    Family History: Family History  Problem Relation Age of Onset  . Cancer Mother        colon, breast, pancreas, skin cancer  . Heart disease Father        heart valve replaced  . Stroke Father   . Diabetes Father   . Cancer Paternal Grandmother        colon  . Obesity Son   . Heart disease Son        bradycardia     Review of Systems: All other systems reviewed and are otherwise negative except as noted above.   Physical Exam: Vitals:   10/28/18 0829  BP: 136/72  Pulse: 91  Weight: 225 lb (102.1 kg)  Height: 6' (1.829 m)     GEN- The patient is well appearing, alert and oriented x 3 today.   HEENT: normocephalic, atraumatic; sclera clear, conjunctiva pink; hearing intact; oropharynx clear; neck supple  Lungs- Clear to ausculation bilaterally, normal work of breathing.  No wheezes, rales,  rhonchi Heart- Regular rate and rhythm, no murmurs, rubs or gallops  GI- soft, non-tender, non-distended, bowel sounds present  Extremities- no clubbing, cyanosis, or edema  MS- no significant deformity or atrophy Skin- warm and dry, no rash or lesion; PPM pocket well healed Psych- euthymic mood, full affect Neuro- strength and sensation are intact  PPM Interrogation- reviewed in detail today,  See PACEART report  EKG:  EKG is not ordered today.   Recent Labs: 04/23/2018: ALT 17; BUN 23; Creatinine, Ser 1.52; Hemoglobin 15.7; Platelets 180.0; Potassium 4.0; Sodium 138; TSH 3.16   Wt Readings from Last 3 Encounters:  10/28/18 225 lb (102.1 kg)  05/11/18 223 lb (101.2 kg)  03/23/18 222 lb 2 oz (100.8 kg)      Assessment and Plan:  1.  Symptomatic bradycardia s/p Medtronic PPM  Normal PPM function See Pace Art report No changes today  2. Permanent Afib CHA2DS2VASC is 3 on Eliquis. ( with age and Cr qualifies for 2.5 dosing) No bleeding on appropriate dose Eliquis  3. Dizziness/Vertigo Resolved. No correlation on device.   4. Chronic diastolic CHF Euvolemic on exam today. Trace R ankle edema likely from R knee arthritis.   Current medicines are reviewed at length with the patient today.   The patient does not have concerns regarding his medicines.  The following changes were made today:  none  Labs/ tests ordered today include:  Orders Placed This Encounter  Procedures  . CUP PACEART INCLINIC DEVICE CHECK     Disposition:   Follow up with Dr. Caryl Comes annually. Next remote 12/30/2018  (q 3 months)   Signed, Shirley Friar, PA-C  10/28/2018 8:52 AM  [facility] [address] [place] [address] [phone] (office) [phone] (fax)

## 2018-11-03 ENCOUNTER — Ambulatory Visit (INDEPENDENT_AMBULATORY_CARE_PROVIDER_SITE_OTHER): Payer: PPO | Admitting: Family Medicine

## 2018-11-03 ENCOUNTER — Encounter: Payer: Self-pay | Admitting: Family Medicine

## 2018-11-03 ENCOUNTER — Other Ambulatory Visit: Payer: Self-pay

## 2018-11-03 VITALS — BP 128/70 | HR 86 | Temp 97.6°F | Resp 18 | Wt 226.6 lb

## 2018-11-03 DIAGNOSIS — E559 Vitamin D deficiency, unspecified: Secondary | ICD-10-CM | POA: Diagnosis not present

## 2018-11-03 DIAGNOSIS — E785 Hyperlipidemia, unspecified: Secondary | ICD-10-CM | POA: Diagnosis not present

## 2018-11-03 DIAGNOSIS — E669 Obesity, unspecified: Secondary | ICD-10-CM | POA: Diagnosis not present

## 2018-11-03 DIAGNOSIS — I10 Essential (primary) hypertension: Secondary | ICD-10-CM | POA: Diagnosis not present

## 2018-11-03 DIAGNOSIS — N183 Chronic kidney disease, stage 3 unspecified: Secondary | ICD-10-CM

## 2018-11-03 DIAGNOSIS — E1169 Type 2 diabetes mellitus with other specified complication: Secondary | ICD-10-CM

## 2018-11-03 LAB — VITAMIN D 25 HYDROXY (VIT D DEFICIENCY, FRACTURES): VITD: 68.45 ng/mL (ref 30.00–100.00)

## 2018-11-03 LAB — CBC
HCT: 47.9 % (ref 39.0–52.0)
Hemoglobin: 15.8 g/dL (ref 13.0–17.0)
MCHC: 32.9 g/dL (ref 30.0–36.0)
MCV: 85.7 fl (ref 78.0–100.0)
Platelets: 174 10*3/uL (ref 150.0–400.0)
RBC: 5.6 Mil/uL (ref 4.22–5.81)
RDW: 13.3 % (ref 11.5–15.5)
WBC: 9 10*3/uL (ref 4.0–10.5)

## 2018-11-03 LAB — LIPID PANEL
Cholesterol: 128 mg/dL (ref 0–200)
HDL: 34.5 mg/dL — ABNORMAL LOW (ref >39.00)
NonHDL: 93.71
Total CHOL/HDL Ratio: 4
Triglycerides: 229 mg/dL — ABNORMAL HIGH (ref 0.0–149.0)
VLDL: 45.8 mg/dL — ABNORMAL HIGH (ref 0.0–40.0)

## 2018-11-03 LAB — COMPREHENSIVE METABOLIC PANEL
ALT: 21 U/L (ref 0–53)
AST: 17 U/L (ref 0–37)
Albumin: 3.9 g/dL (ref 3.5–5.2)
Alkaline Phosphatase: 46 U/L (ref 39–117)
BUN: 38 mg/dL — ABNORMAL HIGH (ref 6–23)
CO2: 25 mEq/L (ref 19–32)
Calcium: 9.6 mg/dL (ref 8.4–10.5)
Chloride: 101 mEq/L (ref 96–112)
Creatinine, Ser: 1.61 mg/dL — ABNORMAL HIGH (ref 0.40–1.50)
GFR: 41.16 mL/min — ABNORMAL LOW (ref >60.00)
Glucose, Bld: 158 mg/dL — ABNORMAL HIGH (ref 70–99)
Potassium: 3.8 mEq/L (ref 3.5–5.1)
Sodium: 137 mEq/L (ref 135–145)
Total Bilirubin: 0.7 mg/dL (ref 0.2–1.2)
Total Protein: 6.7 g/dL (ref 6.0–8.3)

## 2018-11-03 LAB — LDL CHOLESTEROL, DIRECT: Direct LDL: 65 mg/dL

## 2018-11-03 LAB — HEMOGLOBIN A1C: Hgb A1c MFr Bld: 8.4 % — ABNORMAL HIGH (ref 4.6–6.5)

## 2018-11-03 LAB — TSH: TSH: 2.91 u[IU]/mL (ref 0.35–4.50)

## 2018-11-03 MED ORDER — ONETOUCH ULTRASOFT LANCETS MISC: 6 refills | Status: DC

## 2018-11-03 MED ORDER — GLUCOSE BLOOD VI STRP: ORAL_STRIP | 1 refills | Status: AC

## 2018-11-03 NOTE — Patient Instructions (Signed)
Omron blood pressure cuff Pulse oximeter

## 2018-11-04 ENCOUNTER — Other Ambulatory Visit: Payer: Self-pay | Admitting: *Deleted

## 2018-11-04 DIAGNOSIS — E1169 Type 2 diabetes mellitus with other specified complication: Secondary | ICD-10-CM

## 2018-11-04 DIAGNOSIS — E785 Hyperlipidemia, unspecified: Secondary | ICD-10-CM

## 2018-11-04 DIAGNOSIS — N289 Disorder of kidney and ureter, unspecified: Secondary | ICD-10-CM

## 2018-11-04 DIAGNOSIS — I10 Essential (primary) hypertension: Secondary | ICD-10-CM

## 2018-11-04 NOTE — Progress Notes (Signed)
Future lab orders entered and Medication list updated.

## 2018-11-05 ENCOUNTER — Telehealth: Payer: Self-pay | Admitting: *Deleted

## 2018-11-05 NOTE — Telephone Encounter (Signed)
See labs 

## 2018-11-05 NOTE — Telephone Encounter (Signed)
Copied from [place] [phone]. Topic: General - Other >> Nov 04, 2018 11:58 AM Alanda Slim E wrote: Reason for CRM: Pt received message to call Dr. [name]. Pt returned call/ please advise

## 2018-11-08 NOTE — Assessment & Plan Note (Signed)
Tolerating statin, encouraged heart healthy diet, avoid trans fats, minimize simple carbs and saturated fats. Increase exercise as tolerated 

## 2018-11-08 NOTE — Assessment & Plan Note (Signed)
Well controlled, no changes to meds. Encouraged heart healthy diet such as the DASH diet and exercise as tolerated.  °

## 2018-11-08 NOTE — Progress Notes (Signed)
Subjective:    Patient ID: Vincent Allen, male    DOB: 11-Nov-1933, 85 y.o.   MRN: [id number]  No chief complaint on file.   HPI Patient is in today for follow up on chronic medical concerns including obesity, hypertension, diabetes and more. He is struggling with knee pain but it is long standing no recent injury or trauma. He is maintaining quarantine with his family and is accompanied by his son.Denies CP/palp/SOB/HA/congestion/fevers/GI or GU c/o. Taking meds as prescribed  Past Medical History:  Diagnosis Date  . Arthritis   . Bradycardia   . Cataract   . Chronic kidney disease stage III (GFR 30-59 ml/min) 05/03/2012  . Colon polyps   . Complication of anesthesia    emesis  . Diabetes mellitus   . GERD (gastroesophageal reflux disease)   . HOH (hard of hearing)   . HTN (hypertension) 01/13/2013  . Hyperlipidemia   . Hypertension   . Pacemaker 12/2016  . Persistent atrial fibrillation   . Presence of permanent cardiac pacemaker 09/20/2016  . Symptomatic bradycardia   . Vitamin D deficiency 07/17/2015    Past Surgical History:  Procedure Laterality Date  . ANKLE ARTHROPLASTY    . CARDIOVERSION N/A 08/20/2016   Procedure: CARDIOVERSION;  Surgeon: Sanda Klein, MD;  Location: [facility];  Service: Cardiovascular;  Laterality: N/A;  . CATARACT EXTRACTION  2010, 2014  . CATARACT EXTRACTION  02/28/14  . CHOLECYSTECTOMY    . CHOLECYSTECTOMY N/A 05/03/2012   Procedure: LAPAROSCOPIC CHOLECYSTECTOMY WITH INTRAOPERATIVE CHOLANGIOGRAM;  Surgeon: [name], MD;  Location: [facility];  Service: General;  Laterality: N/A;  . INSERT / REPLACE / REMOVE PACEMAKER  09/20/2016  . PACEMAKER IMPLANT N/A 09/20/2016   Procedure: Pacemaker Implant;  Surgeon: Deboraha Sprang, MD;  Location: [facility];  Service: Cardiovascular;  Laterality: N/A;  . ROTATOR CUFF REPAIR    . SHOULDER SURGERY    . TONSILLECTOMY      Family History  Problem Relation Age of Onset  . Cancer Mother         colon, breast, pancreas, skin cancer  . Heart disease Father        heart valve replaced  . Stroke Father   . Diabetes Father   . Cancer Paternal Grandmother        colon  . Obesity Son   . Heart disease Son        bradycardia    Social History   Socioeconomic History  . Marital status: Widowed    Spouse name: Not on file  . Number of children: Not on file  . Years of education: Not on file  . Highest education level: Not on file  Occupational History  . Not on file  Social Needs  . Financial resource strain: Not on file  . Food insecurity    Worry: Not on file    Inability: Not on file  . Transportation needs    Medical: Not on file    Non-medical: Not on file  Tobacco Use  . Smoking status: Never Smoker  . Smokeless tobacco: Never Used  Substance and Sexual Activity  . Alcohol use: Yes    Alcohol/week: 1.0 standard drinks    Types: 1 Glasses of wine per week  . Drug use: No  . 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.  Lifestyle  . Physical activity  Days per week: Not on file    Minutes per session: Not on file  . Stress: Not on file  Relationships  . Social Herbalist on phone: Not on file    Gets together: Not on file    Attends religious service: Not on file    Active member of club or organization: Not on file    Attends meetings of clubs or organizations: Not on file    Relationship status: Not on file  . Intimate partner violence    Fear of current or ex partner: Not on file    Emotionally abused: Not on file    Physically abused: Not on file    Forced sexual activity: Not on file  Other Topics Concern  . Not on file  Social History Narrative  . Not on file    Outpatient Medications Prior to Visit  Medication Sig Dispense Refill  . amLODipine (NORVASC) 5 MG tablet TAKE 1 TABLET BY MOUTH EVERY DAY 90 tablet 1  . apixaban  (ELIQUIS) 2.5 MG TABS tablet Take 1 tablet (2.5 mg total) by mouth 2 (two) times daily. 60 tablet 5  . Calcium 600 MG tablet Take 1 tablet (600 mg total) by mouth 2 (two) times daily. 60 tablet 0  . furosemide (LASIX) 20 MG tablet TAKE 1 TABLET (20 MG TOTAL) BY MOUTH DAILY AS NEEDED FOR EDEMA (WEIGHT GAIN >3LBS IN 24 HRS, SOB). 30 tablet 2  . glipiZIDE (GLUCOTROL) 10 MG tablet TAKE 2 TABLETS BY MOUTH TWICE A DAY BEFORE MEALS 360 tablet 0  . JANUVIA 100 MG tablet TAKE 1/2 TO 1 TABLET DAILY 30 tablet 5  . meclizine (ANTIVERT) 25 MG tablet Take 1 tablet (25 mg total) by mouth 3 (three) times daily as needed for dizziness. 30 tablet 0  . metFORMIN (GLUCOPHAGE-XR) 500 MG 24 hr tablet Take 1 tablet (500 mg total) by mouth daily with breakfast. 180 tablet 1  . neomycin-polymyxin-hydrocortisone (CORTISPORIN) OTIC solution Place 4 drops into both ears 3 (three) times daily. 10 mL 0  . triamterene-hydrochlorothiazide (MAXZIDE) 75-50 MG tablet TAKE 1 TABLET BY MOUTH DAILY. 90 tablet 1  . glucose blood (ONE TOUCH ULTRA TEST) test strip USE 1 STRIP 2 TIMES DAILY TO CHECK BLOOD SUGAR DX E08.22, N18.3 300 each 1  . Lancets (ONETOUCH ULTRASOFT) lancets Use as instructed to check blood sugar twice a day as needed Dx Code E11.9 100 each 6  . losartan (COZAAR) 25 MG tablet Take 50 mg by mouth daily.    Marland Kitchen atorvastatin (LIPITOR) 20 MG tablet Take 1 tablet (20 mg total) by mouth daily. 90 tablet 3   No facility-administered medications prior to visit.     Allergies  Allergen Reactions  . Penicillins Hives and Itching    Has patient had a PCN reaction causing immediate rash, facial/tongue/throat swelling, SOB or lightheadedness with hypotension:Yes Has patient had a PCN reaction causing severe rash involving mucus membranes or skin necrosis:No Has patient had a PCN reaction that required hospitalization:No Has patient had a PCN reaction occurring within the last 10 years:No If all of the above answers are "NO",  then may proceed with Cephalosporin use.   . Verapamil     Junctional rhythm  . Influenza Vaccines Rash    Review of Systems  Constitutional: Negative for fever and malaise/fatigue.  HENT: Negative for congestion.   Eyes: Negative for blurred vision.  Respiratory: Negative for shortness of breath.   Cardiovascular: Negative for chest pain, palpitations  and leg swelling.  Gastrointestinal: Negative for abdominal pain, blood in stool and nausea.  Genitourinary: Negative for dysuria and frequency.  Musculoskeletal: Positive for joint pain. Negative for falls.  Skin: Negative for rash.  Neurological: Negative for dizziness, loss of consciousness and headaches.  Endo/Heme/Allergies: Negative for environmental allergies.  Psychiatric/Behavioral: Negative for depression. The patient is not nervous/anxious.        Objective:    Physical Exam Vitals signs and nursing note reviewed.  Constitutional:      General: He is not in acute distress.    Appearance: He is well-developed.  HENT:     Head: Normocephalic and atraumatic.     Nose: Nose normal.  Eyes:     General:        Right eye: No discharge.        Left eye: No discharge.  Neck:     Musculoskeletal: Normal range of motion and neck supple.  Cardiovascular:     Rate and Rhythm: Normal rate and regular rhythm.     Heart sounds: No murmur.  Pulmonary:     Effort: Pulmonary effort is normal.     Breath sounds: Normal breath sounds.  Abdominal:     General: Bowel sounds are normal.     Palpations: Abdomen is soft.     Tenderness: There is no abdominal tenderness.  Skin:    General: Skin is warm and dry.  Neurological:     Mental Status: He is alert and oriented to person, place, and time.     BP 128/70 (BP Location: Left Arm, Patient Position: Sitting, Cuff Size: Normal)   Pulse 86   Temp 97.6 F (36.4 C) (Oral)   Resp 18   Wt 226 lb 9.6 oz (102.8 kg)   SpO2 97%   BMI 30.73 kg/m  Wt Readings from Last 3  Encounters:  11/03/18 226 lb 9.6 oz (102.8 kg)  10/28/18 225 lb (102.1 kg)  05/11/18 223 lb (101.2 kg)    Diabetic Foot Exam - Simple   No data filed     Lab Results  Component Value Date   WBC 9.0 11/03/2018   HGB 15.8 11/03/2018   HCT 47.9 11/03/2018   PLT 174.0 11/03/2018   GLUCOSE 158 (H) 11/03/2018   CHOL 128 11/03/2018   TRIG 229.0 (H) 11/03/2018   HDL 34.50 (L) 11/03/2018   LDLDIRECT 65.0 11/03/2018   LDLCALC 100 (H) 04/23/2018   ALT 21 11/03/2018   AST 17 11/03/2018   NA 137 11/03/2018   K 3.8 11/03/2018   CL 101 11/03/2018   CREATININE 1.61 (H) 11/03/2018   BUN 38 (H) 11/03/2018   CO2 25 11/03/2018   TSH 2.91 11/03/2018   PSA 1.18 10/07/2012   INR 1.0 08/15/2016   HGBA1C 8.4 (H) 11/03/2018   MICROALBUR 26.7 (H) 07/15/2016    Lab Results  Component Value Date   TSH 2.91 11/03/2018   Lab Results  Component Value Date   WBC 9.0 11/03/2018   HGB 15.8 11/03/2018   HCT 47.9 11/03/2018   MCV 85.7 11/03/2018   PLT 174.0 11/03/2018   Lab Results  Component Value Date   NA 137 11/03/2018   K 3.8 11/03/2018   CO2 25 11/03/2018   GLUCOSE 158 (H) 11/03/2018   BUN 38 (H) 11/03/2018   CREATININE 1.61 (H) 11/03/2018   BILITOT 0.7 11/03/2018   ALKPHOS 46 11/03/2018   AST 17 11/03/2018   ALT 21 11/03/2018   PROT 6.7 11/03/2018  ALBUMIN 3.9 11/03/2018   CALCIUM 9.6 11/03/2018   ANIONGAP 8 07/10/2016   GFR 41.16 (L) 11/03/2018   Lab Results  Component Value Date   CHOL 128 11/03/2018   Lab Results  Component Value Date   HDL 34.50 (L) 11/03/2018   Lab Results  Component Value Date   LDLCALC 100 (H) 04/23/2018   Lab Results  Component Value Date   TRIG 229.0 (H) 11/03/2018   Lab Results  Component Value Date   CHOLHDL 4 11/03/2018   Lab Results  Component Value Date   HGBA1C 8.4 (H) 11/03/2018       Assessment & Plan:   Problem List Items Addressed This Visit    Hyperlipidemia LDL goal <70 (Chronic)    Tolerating statin, encouraged  heart healthy diet, avoid trans fats, minimize simple carbs and saturated fats. Increase exercise as tolerated      Chronic kidney disease stage III (GFR 30-59 ml/min) (Chronic)    Hydrate and monitor      Essential hypertension    Well controlled, no changes to meds. Encouraged heart healthy diet such as the DASH diet and exercise as tolerated.       Relevant Orders   CBC (Completed)   Comprehensive metabolic panel (Completed)   TSH (Completed)   Diabetes mellitus type 2 in obese (HCC)    hgba1c acceptable, minimize simple carbs. Increase exercise as tolerated. Continue current meds      Relevant Orders   Hemoglobin A1c (Completed)   Vitamin D deficiency - Primary   Relevant Orders   VITAMIN D 25 Hydroxy (Vit-D Deficiency, Fractures) (Completed)    Other Visit Diagnoses    Hyperlipidemia, unspecified hyperlipidemia type       Relevant Orders   Lipid panel (Completed)      I am having Arlander C. Alroy Dust "[name]" maintain his calcium carbonate, meclizine, neomycin-polymyxin-hydrocortisone, furosemide, metFORMIN, triamterene-hydrochlorothiazide, Januvia, atorvastatin, amLODipine, apixaban, glipiZIDE, glucose blood, and onetouch ultrasoft.  Meds ordered this encounter  Medications  . glucose blood (ONE TOUCH ULTRA TEST) test strip    Sig: USE 1 STRIP 2 TIMES DAILY TO CHECK BLOOD SUGAR DX E08.22, N18.3    Dispense:  300 each    Refill:  1  . Lancets (ONETOUCH ULTRASOFT) lancets    Sig: Use as instructed to check blood sugar twice a day as needed Dx Code E11.9    Dispense:  100 each    Refill:  6     [name], MD

## 2018-11-08 NOTE — Assessment & Plan Note (Signed)
hgba1c acceptable, minimize simple carbs. Increase exercise as tolerated. Continue current meds 

## 2018-11-08 NOTE — Assessment & Plan Note (Signed)
Hydrate and monitor 

## 2018-11-13 ENCOUNTER — Other Ambulatory Visit: Payer: Self-pay | Admitting: Family Medicine

## 2018-11-30 ENCOUNTER — Other Ambulatory Visit: Payer: Self-pay | Admitting: Family Medicine

## 2018-12-04 ENCOUNTER — Ambulatory Visit (INDEPENDENT_AMBULATORY_CARE_PROVIDER_SITE_OTHER): Payer: PPO | Admitting: Family Medicine

## 2018-12-04 ENCOUNTER — Other Ambulatory Visit: Payer: Self-pay

## 2018-12-04 ENCOUNTER — Other Ambulatory Visit (INDEPENDENT_AMBULATORY_CARE_PROVIDER_SITE_OTHER): Payer: PPO

## 2018-12-04 VITALS — BP 126/74 | HR 86 | Temp 97.3°F

## 2018-12-04 DIAGNOSIS — I10 Essential (primary) hypertension: Secondary | ICD-10-CM

## 2018-12-04 DIAGNOSIS — E1169 Type 2 diabetes mellitus with other specified complication: Secondary | ICD-10-CM

## 2018-12-04 DIAGNOSIS — N289 Disorder of kidney and ureter, unspecified: Secondary | ICD-10-CM | POA: Diagnosis not present

## 2018-12-04 DIAGNOSIS — E785 Hyperlipidemia, unspecified: Secondary | ICD-10-CM

## 2018-12-04 LAB — COMPREHENSIVE METABOLIC PANEL
ALT: 18 U/L (ref 0–53)
AST: 14 U/L (ref 0–37)
Albumin: 3.7 g/dL (ref 3.5–5.2)
Alkaline Phosphatase: 53 U/L (ref 39–117)
BUN: 27 mg/dL — ABNORMAL HIGH (ref 6–23)
CO2: 24 mEq/L (ref 19–32)
Calcium: 9.7 mg/dL (ref 8.4–10.5)
Chloride: 101 mEq/L (ref 96–112)
Creatinine, Ser: 1.49 mg/dL (ref 0.40–1.50)
GFR: 45 mL/min — ABNORMAL LOW (ref >60.00)
Glucose, Bld: 263 mg/dL — ABNORMAL HIGH (ref 70–99)
Potassium: 3.7 mEq/L (ref 3.5–5.1)
Sodium: 135 mEq/L (ref 135–145)
Total Bilirubin: 0.8 mg/dL (ref 0.2–1.2)
Total Protein: 6.9 g/dL (ref 6.0–8.3)

## 2018-12-04 NOTE — Addendum Note (Signed)
Addended by: Caffie Pinto on: 12/04/2018 09:41 AM   Modules accepted: Orders

## 2018-12-04 NOTE — Progress Notes (Signed)
Patient here today for blood pressure check after decreasing losartan.  Patient states that blood pressures has been running up and down.  Today blood pressure was 126/74 pulse 86.  Next appointment with PCP is 04/05/19.   Per Dr Nani Ravens blood pressure looks good.  Ok to wait to seen her in January but if blood pressure runs 150/90 to call for a sooner appointment.  Patient notified of plan and agreed with plan.

## 2018-12-21 ENCOUNTER — Encounter: Payer: Self-pay | Admitting: Podiatry

## 2018-12-21 ENCOUNTER — Other Ambulatory Visit: Payer: Self-pay

## 2018-12-21 ENCOUNTER — Ambulatory Visit (INDEPENDENT_AMBULATORY_CARE_PROVIDER_SITE_OTHER): Payer: PPO | Admitting: Podiatry

## 2018-12-21 DIAGNOSIS — M79675 Pain in left toe(s): Secondary | ICD-10-CM

## 2018-12-21 DIAGNOSIS — B351 Tinea unguium: Secondary | ICD-10-CM | POA: Diagnosis not present

## 2018-12-21 DIAGNOSIS — E0822 Diabetes mellitus due to underlying condition with diabetic chronic kidney disease: Secondary | ICD-10-CM

## 2018-12-21 DIAGNOSIS — N183 Chronic kidney disease, stage 3 unspecified: Secondary | ICD-10-CM

## 2018-12-21 DIAGNOSIS — M79674 Pain in right toe(s): Secondary | ICD-10-CM | POA: Diagnosis not present

## 2018-12-21 NOTE — Patient Instructions (Signed)
Diabetes Mellitus and Foot Care Foot care is an important part of your health, especially when you have diabetes. Diabetes may cause you to have problems because of poor blood flow (circulation) to your feet and legs, which can cause your skin to:  Become thinner and drier.  Break more easily.  Heal more slowly.  Peel and crack. You may also have nerve damage (neuropathy) in your legs and feet, causing decreased feeling in them. This means that you may not notice minor injuries to your feet that could lead to more serious problems. Noticing and addressing any potential problems early is the best way to prevent future foot problems. How to care for your feet Foot hygiene  Wash your feet daily with warm water and mild soap. Do not use hot water. Then, pat your feet and the areas between your toes until they are completely dry. Do not soak your feet as this can dry your skin.  Trim your toenails straight across. Do not dig under them or around the cuticle. File the edges of your nails with an emery board or nail file.  Apply a moisturizing lotion or petroleum jelly to the skin on your feet and to dry, brittle toenails. Use lotion that does not contain alcohol and is unscented. Do not apply lotion between your toes. Shoes and socks  Wear clean socks or stockings every day. Make sure they are not too tight. Do not wear knee-high stockings since they may decrease blood flow to your legs.  Wear shoes that fit properly and have enough cushioning. Always look in your shoes before you put them on to be sure there are no objects inside.  To break in new shoes, wear them for just a few hours a day. This prevents injuries on your feet. Wounds, scrapes, corns, and calluses  Check your feet daily for blisters, cuts, bruises, sores, and redness. If you cannot see the bottom of your feet, use a mirror or ask someone for help.  Do not cut corns or calluses or try to remove them with medicine.  If you  find a minor scrape, cut, or break in the skin on your feet, keep it and the skin around it clean and dry. You may clean these areas with mild soap and water. Do not clean the area with peroxide, alcohol, or iodine.  If you have a wound, scrape, corn, or callus on your foot, look at it several times a day to make sure it is healing and not infected. Check for: ? Redness, swelling, or pain. ? Fluid or blood. ? Warmth. ? Pus or a bad smell. General instructions  Do not cross your legs. This may decrease blood flow to your feet.  Do not use heating pads or hot water bottles on your feet. They may burn your skin. If you have lost feeling in your feet or legs, you may not know this is happening until it is too late.  Protect your feet from hot and cold by wearing shoes, such as at the beach or on hot pavement.  Schedule a complete foot exam at least once a year (annually) or more often if you have foot problems. If you have foot problems, report any cuts, sores, or bruises to your health care provider immediately. Contact a health care provider if:  You have a medical condition that increases your risk of infection and you have any cuts, sores, or bruises on your feet.  You have an injury that is not   healing.  You have redness on your legs or feet.  You feel burning or tingling in your legs or feet.  You have pain or cramps in your legs and feet.  Your legs or feet are numb.  Your feet always feel cold.  You have pain around a toenail. Get help right away if:  You have a wound, scrape, corn, or callus on your foot and: ? You have pain, swelling, or redness that gets worse. ? You have fluid or blood coming from the wound, scrape, corn, or callus. ? Your wound, scrape, corn, or callus feels warm to the touch. ? You have pus or a bad smell coming from the wound, scrape, corn, or callus. ? You have a fever. ? You have a red line going up your leg. Summary  Check your feet every day  for cuts, sores, red spots, swelling, and blisters.  Moisturize feet and legs daily.  Wear shoes that fit properly and have enough cushioning.  If you have foot problems, report any cuts, sores, or bruises to your health care provider immediately.  Schedule a complete foot exam at least once a year (annually) or more often if you have foot problems. This information is not intended to replace advice given to you by your health care provider. Make sure you discuss any questions you have with your health care provider. Document Released: 03/01/2000 Document Revised: 04/16/2017 Document Reviewed: 04/05/2016 Elsevier Patient Education  2020 [employer]   Onychomycosis/Fungal Toenails  WHAT IS IT? An infection that lies within the keratin of your nail plate that is caused by a fungus.  WHY ME? Fungal infections affect all ages, sexes, races, and creeds.  There may be many factors that predispose you to a fungal infection such as age, coexisting medical conditions such as diabetes, or an autoimmune disease; stress, medications, fatigue, genetics, etc.  Bottom line: fungus thrives in a warm, moist environment and your shoes offer such a location.  IS IT CONTAGIOUS? Theoretically, yes.  You do not want to share shoes, nail clippers or files with someone who has fungal toenails.  Walking around barefoot in the same room or sleeping in the same bed is unlikely to transfer the organism.  It is important to realize, however, that fungus can spread easily from one nail to the next on the same foot.  HOW DO WE TREAT THIS?  There are several ways to treat this condition.  Treatment may depend on many factors such as age, medications, pregnancy, liver and kidney conditions, etc.  It is best to ask your doctor which options are available to you.  1. No treatment.   Unlike many other medical concerns, you can live with this condition.  However for many people this can be a painful condition and may lead to  ingrown toenails or a bacterial infection.  It is recommended that you keep the nails cut short to help reduce the amount of fungal nail. 2. Topical treatment.  These range from herbal remedies to prescription strength nail lacquers.  About 40-50% effective, topicals require twice daily application for approximately 9 to 12 months or until an entirely new nail has grown out.  The most effective topicals are medical grade medications available through physicians offices. 3. Oral antifungal medications.  With an 80-90% cure rate, the most common oral medication requires 3 to 4 months of therapy and stays in your system for a year as the new nail grows out.  Oral antifungal medications do require   blood work to make sure it is a safe drug for you.  A liver function panel will be performed prior to starting the medication and after the first month of treatment.  It is important to have the blood work performed to avoid any harmful side effects.  In general, this medication safe but blood work is required. 4. Laser Therapy.  This treatment is performed by applying a specialized laser to the affected nail plate.  This therapy is noninvasive, fast, and non-painful.  It is not covered by insurance and is therefore, out of pocket.  The results have been very good with a 80-95% cure rate.  [facility] is the only practice in the area to offer this therapy. 5. Permanent Nail Avulsion.  Removing the entire nail so that a new nail will not grow back. 

## 2018-12-21 NOTE — Progress Notes (Signed)
Subjective: Vincent Allen is seen today for follow up painful, elongated, thickened toenails 1-5 b/l feet that he cannot cut. Pain interferes with daily activities. Aggravating factor includes wearing enclosed shoe gear and relieved with periodic debridement.  Patient states his right 3rd digit toenail is causing pain again. He denies any redness, drainage or swelling.  Current Outpatient Medications on File Prior to Visit  Medication Sig  . amLODipine (NORVASC) 5 MG tablet TAKE 1 TABLET BY MOUTH EVERY DAY  . apixaban (ELIQUIS) 2.5 MG TABS tablet Take 1 tablet (2.5 mg total) by mouth 2 (two) times daily.  Marland Kitchen atorvastatin (LIPITOR) 20 MG tablet Take 1 tablet (20 mg total) by mouth daily.  . Calcium 600 MG tablet Take 1 tablet (600 mg total) by mouth 2 (two) times daily.  . furosemide (LASIX) 20 MG tablet TAKE 1 TABLET (20 MG TOTAL) BY MOUTH DAILY AS NEEDED FOR EDEMA (WEIGHT GAIN >3LBS IN 24 HRS, SOB).  Marland Kitchen glipiZIDE (GLUCOTROL) 10 MG tablet TAKE 2 TABLETS BY MOUTH TWICE A DAY BEFORE MEALS  . glucose blood (ONE TOUCH ULTRA TEST) test strip USE 1 STRIP 2 TIMES DAILY TO CHECK BLOOD SUGAR DX E08.22, N18.3  . JANUVIA 100 MG tablet TAKE 1/2 TO 1 TABLET BY MOUTH DAILY  . Lancets (ONETOUCH ULTRASOFT) lancets Use as instructed to check blood sugar twice a day as needed Dx Code E11.9  . losartan (COZAAR) 25 MG tablet Take 1 tablet (25 mg total) by mouth daily.  Marland Kitchen losartan (COZAAR) 50 MG tablet Take 50 mg by mouth daily.  . meclizine (ANTIVERT) 25 MG tablet Take 1 tablet (25 mg total) by mouth 3 (three) times daily as needed for dizziness.  . metFORMIN (GLUCOPHAGE-XR) 500 MG 24 hr tablet Take 1 tablet (500 mg total) by mouth daily with breakfast.  . neomycin-polymyxin-hydrocortisone (CORTISPORIN) OTIC solution Place 4 drops into both ears 3 (three) times daily.  Marland Kitchen triamterene-hydrochlorothiazide (MAXZIDE) 75-50 MG tablet TAKE 1 TABLET BY MOUTH EVERY DAY   No current facility-administered medications on  file prior to visit.      Allergies  Allergen Reactions  . Penicillins Hives and Itching    Has patient had a PCN reaction causing immediate rash, facial/tongue/throat swelling, SOB or lightheadedness with hypotension:Yes Has patient had a PCN reaction causing severe rash involving mucus membranes or skin necrosis:No Has patient had a PCN reaction that required hospitalization:No Has patient had a PCN reaction occurring within the last 10 years:No If all of the above answers are "NO", then may proceed with Cephalosporin use.   . Verapamil     Junctional rhythm  . Influenza Vaccines Rash   Objective:  Vascular Examination: Capillary refill time <3 seconds x 10 digits.  Dorsalis pedis palpable b/l.  Posterior tibial pulses palpable b/l.  Digital hair absent b/l.  Skin temperature gradient WNL b/l.   Dermatological Examination: Skin with normal turgor, texture and tone b/l.  Toenails 1-5 b/l discolored, thick, dystrophic with subungual debris and pain with palpation to nailbeds due to thickness of nails.  No open wounds b/l.  Musculoskeletal: Muscle strength 5/5 to all LE muscle groups b/l.   No gross bony deformities b/l.  No pain, crepitus or joint limitation noted with ROM.   Neurological Examination: Protective sensation intact 5/5 b/l with 10 gram monofilament.  Vibratory sensation intact bilaterally.   Hemoglobin A1C Latest Ref Rng & Units 11/03/2018 04/23/2018 03/19/2018  HGBA1C 4.6 - 6.5 % 8.4(H) 7.1(H) 7.1(H)  Some recent data might be hidden  Assessment: Painful onychomycosis toenails 1-5 b/l  Pt on long term blood thinner NIDDM with CKD, stage III  Plan: 1. Continue diabetic foot care principles. Literature dispensed on today. 2. Toenails 1-5 b/l were debrided in length and girth without iatrogenic bleeding. 3. Patient to continue soft, supportive shoe gear 4. Patient to report any pedal injuries to medical professional immediately. 5. Follow up 3  months.  6. Patient/POA to call should there be a concern in the interim.

## 2018-12-29 ENCOUNTER — Other Ambulatory Visit: Payer: Self-pay | Admitting: Family Medicine

## 2018-12-30 ENCOUNTER — Ambulatory Visit (INDEPENDENT_AMBULATORY_CARE_PROVIDER_SITE_OTHER): Payer: PPO | Admitting: *Deleted

## 2018-12-30 DIAGNOSIS — I4819 Other persistent atrial fibrillation: Secondary | ICD-10-CM

## 2018-12-30 DIAGNOSIS — I5032 Chronic diastolic (congestive) heart failure: Secondary | ICD-10-CM

## 2018-12-30 LAB — CUP PACEART REMOTE DEVICE CHECK
Battery Remaining Longevity: 127 mo
Battery Voltage: 3 V
Brady Statistic AP VP Percent: 0 %
Brady Statistic AP VS Percent: 0 %
Brady Statistic AS VP Percent: 0 %
Brady Statistic AS VS Percent: 0 %
Brady Statistic RA Percent Paced: 0.19 %
Brady Statistic RV Percent Paced: 99.44 %
Date Time Interrogation Session: 20201014132434
Implantable Lead Implant Date: 20180706
Implantable Lead Implant Date: 20180706
Implantable Lead Location: 753859
Implantable Lead Location: 753860
Implantable Lead Model: 5076
Implantable Lead Model: 5076
Implantable Pulse Generator Implant Date: 20180706
Lead Channel Impedance Value: 323 Ohm
Lead Channel Impedance Value: 570 Ohm
Lead Channel Impedance Value: 608 Ohm
Lead Channel Impedance Value: 722 Ohm
Lead Channel Pacing Threshold Amplitude: 0.5 V
Lead Channel Pacing Threshold Pulse Width: 0.4 ms
Lead Channel Sensing Intrinsic Amplitude: 1.125 mV
Lead Channel Sensing Intrinsic Amplitude: 1.125 mV
Lead Channel Sensing Intrinsic Amplitude: 11.625 mV
Lead Channel Sensing Intrinsic Amplitude: 11.625 mV
Lead Channel Setting Pacing Amplitude: 2 V
Lead Channel Setting Pacing Amplitude: 2.5 V
Lead Channel Setting Pacing Pulse Width: 0.4 ms
Lead Channel Setting Sensing Sensitivity: 2.8 mV

## 2019-01-12 NOTE — Progress Notes (Signed)
Remote pacemaker transmission.   

## 2019-01-13 ENCOUNTER — Other Ambulatory Visit: Payer: Self-pay | Admitting: Family Medicine

## 2019-01-13 DIAGNOSIS — I4819 Other persistent atrial fibrillation: Secondary | ICD-10-CM

## 2019-02-10 ENCOUNTER — Other Ambulatory Visit: Payer: Self-pay

## 2019-02-15 ENCOUNTER — Other Ambulatory Visit: Payer: Self-pay

## 2019-02-15 ENCOUNTER — Other Ambulatory Visit (INDEPENDENT_AMBULATORY_CARE_PROVIDER_SITE_OTHER): Payer: PPO

## 2019-02-15 DIAGNOSIS — E785 Hyperlipidemia, unspecified: Secondary | ICD-10-CM

## 2019-02-15 DIAGNOSIS — I10 Essential (primary) hypertension: Secondary | ICD-10-CM

## 2019-02-15 DIAGNOSIS — E669 Obesity, unspecified: Secondary | ICD-10-CM

## 2019-02-15 DIAGNOSIS — E1169 Type 2 diabetes mellitus with other specified complication: Secondary | ICD-10-CM | POA: Diagnosis not present

## 2019-02-15 LAB — COMPREHENSIVE METABOLIC PANEL
ALT: 15 U/L (ref 0–53)
AST: 14 U/L (ref 0–37)
Albumin: 3.6 g/dL (ref 3.5–5.2)
Alkaline Phosphatase: 45 U/L (ref 39–117)
BUN: 31 mg/dL — ABNORMAL HIGH (ref 6–23)
CO2: 23 mEq/L (ref 19–32)
Calcium: 9.2 mg/dL (ref 8.4–10.5)
Chloride: 104 mEq/L (ref 96–112)
Creatinine, Ser: 1.51 mg/dL — ABNORMAL HIGH (ref 0.40–1.50)
GFR: 44.29 mL/min — ABNORMAL LOW (ref >60.00)
Glucose, Bld: 127 mg/dL — ABNORMAL HIGH (ref 70–99)
Potassium: 4.4 mEq/L (ref 3.5–5.1)
Sodium: 138 mEq/L (ref 135–145)
Total Bilirubin: 1 mg/dL (ref 0.2–1.2)
Total Protein: 6.4 g/dL (ref 6.0–8.3)

## 2019-02-15 LAB — LIPID PANEL
Cholesterol: 108 mg/dL (ref 0–200)
HDL: 32.3 mg/dL — ABNORMAL LOW (ref >39.00)
LDL Cholesterol: 49 mg/dL (ref 0–99)
NonHDL: 75.75
Total CHOL/HDL Ratio: 3
Triglycerides: 134 mg/dL (ref 0.0–149.0)
VLDL: 26.8 mg/dL (ref 0.0–40.0)

## 2019-02-15 LAB — HEMOGLOBIN A1C: Hgb A1c MFr Bld: 8.4 % — ABNORMAL HIGH (ref 4.6–6.5)

## 2019-02-15 LAB — CBC
HCT: 46.7 % (ref 39.0–52.0)
Hemoglobin: 15.2 g/dL (ref 13.0–17.0)
MCHC: 32.5 g/dL (ref 30.0–36.0)
MCV: 86.4 fl (ref 78.0–100.0)
Platelets: 155 10*3/uL (ref 150.0–400.0)
RBC: 5.4 Mil/uL (ref 4.22–5.81)
RDW: 13.7 % (ref 11.5–15.5)
WBC: 7.3 10*3/uL (ref 4.0–10.5)

## 2019-02-15 LAB — TSH: TSH: 2.6 u[IU]/mL (ref 0.35–4.50)

## 2019-02-16 ENCOUNTER — Other Ambulatory Visit: Payer: Self-pay | Admitting: Family Medicine

## 2019-03-24 ENCOUNTER — Encounter: Payer: Self-pay | Admitting: Podiatry

## 2019-03-24 ENCOUNTER — Other Ambulatory Visit: Payer: Self-pay

## 2019-03-24 ENCOUNTER — Ambulatory Visit: Payer: PPO | Admitting: Podiatry

## 2019-03-24 DIAGNOSIS — B351 Tinea unguium: Secondary | ICD-10-CM

## 2019-03-24 DIAGNOSIS — E0822 Diabetes mellitus due to underlying condition with diabetic chronic kidney disease: Secondary | ICD-10-CM

## 2019-03-24 DIAGNOSIS — M79675 Pain in left toe(s): Secondary | ICD-10-CM

## 2019-03-24 DIAGNOSIS — N183 Chronic kidney disease, stage 3 unspecified: Secondary | ICD-10-CM

## 2019-03-24 DIAGNOSIS — M79674 Pain in right toe(s): Secondary | ICD-10-CM

## 2019-03-24 NOTE — Progress Notes (Signed)
Subjective: Vincent Allen is a 86 y.o. y.o. male with h/o diabetes who presents today for preventative diabetic foot care. Patient has painful, elongated mycotic toenails which interfere with daily activities. Pain is aggravated when wearing enclosed shoe gear and relieved with periodic professional debridement.  He also has CKD, CAD, s/p pacemaker, and on blood thinner, Eliquis.  [name], MD is patient's PCP.   Medications reviewed in chart.  Allergies  Allergen Reactions  . Penicillins Hives and Itching    Has patient had a PCN reaction causing immediate rash, facial/tongue/throat swelling, SOB or lightheadedness with hypotension:Yes Has patient had a PCN reaction causing severe rash involving mucus membranes or skin necrosis:No Has patient had a PCN reaction that required hospitalization:No Has patient had a PCN reaction occurring within the last 10 years:No If all of the above answers are "NO", then may proceed with Cephalosporin use.   . Verapamil     Junctional rhythm  . Influenza Vaccines Rash   Objective: There were no vitals filed for this visit.  Vascular Examination: Capillary refill time to digits <3 seconds b/l.  Dorsalis pedis and posterior tibial pulses palpable b/l.  Digital hair absent b/l.  Skin temperature gradient WNL b/l.  Dermatological Examination: Skin with normal turgor, texture and tone b/l.  Toenails 1-5 b/l discolored, thick, dystrophic with subungual debris and pain with palpation to nailbeds due to thickness of nails.  Musculoskeletal: Muscle strength 5/5 to all LE muscle groups b/l.  No gross bony deformities b/l.  No pain, crepitus or joint limitation noted with ROM.  Neurological: Sensation intact 5/5 b/l with 10 gram monofilament.  Vibratory sensation intact b/l.  Last A1c:  Hemoglobin A1C Latest Ref Rng & Units 02/15/2019 11/03/2018 04/23/2018  HGBA1C 4.6 - 6.5 % 8.4(H) 8.4(H) 7.1(H)  Some recent data might be hidden    Assessment: 1. Painful onychomycosis toenails 1-5 b/l 2.   NIDDM with CKD 3.   Pt on long term blood thinner  Plan: 1. Continue diabetic foot care principles. Literature dispensed on today. 2. Toenails 1-5 b/l were debrided in length and girth without iatrogenic bleeding.  3. Patient to continue soft, supportive shoe gear daily. 4. Patient to report any pedal injuries to medical professional immediately. 5. Follow up 3 months.  6. Patient/POA to call should there be a concern in the interim.

## 2019-03-24 NOTE — Patient Instructions (Signed)
Diabetes Mellitus and Foot Care Foot care is an important part of your health, especially when you have diabetes. Diabetes may cause you to have problems because of poor blood flow (circulation) to your feet and legs, which can cause your skin to:  Become thinner and drier.  Break more easily.  Heal more slowly.  Peel and crack. You may also have nerve damage (neuropathy) in your legs and feet, causing decreased feeling in them. This means that you may not notice minor injuries to your feet that could lead to more serious problems. Noticing and addressing any potential problems early is the best way to prevent future foot problems. How to care for your feet Foot hygiene  Wash your feet daily with warm water and mild soap. Do not use hot water. Then, pat your feet and the areas between your toes until they are completely dry. Do not soak your feet as this can dry your skin.  Trim your toenails straight across. Do not dig under them or around the cuticle. File the edges of your nails with an emery board or nail file.  Apply a moisturizing lotion or petroleum jelly to the skin on your feet and to dry, brittle toenails. Use lotion that does not contain alcohol and is unscented. Do not apply lotion between your toes. Shoes and socks  Wear clean socks or stockings every day. Make sure they are not too tight. Do not wear knee-high stockings since they may decrease blood flow to your legs.  Wear shoes that fit properly and have enough cushioning. Always look in your shoes before you put them on to be sure there are no objects inside.  To break in new shoes, wear them for just a few hours a day. This prevents injuries on your feet. Wounds, scrapes, corns, and calluses  Check your feet daily for blisters, cuts, bruises, sores, and redness. If you cannot see the bottom of your feet, use a mirror or ask someone for help.  Do not cut corns or calluses or try to remove them with medicine.  If you  find a minor scrape, cut, or break in the skin on your feet, keep it and the skin around it clean and dry. You may clean these areas with mild soap and water. Do not clean the area with peroxide, alcohol, or iodine.  If you have a wound, scrape, corn, or callus on your foot, look at it several times a day to make sure it is healing and not infected. Check for: ? Redness, swelling, or pain. ? Fluid or blood. ? Warmth. ? Pus or a bad smell. General instructions  Do not cross your legs. This may decrease blood flow to your feet.  Do not use heating pads or hot water bottles on your feet. They may burn your skin. If you have lost feeling in your feet or legs, you may not know this is happening until it is too late.  Protect your feet from hot and cold by wearing shoes, such as at the beach or on hot pavement.  Schedule a complete foot exam at least once a year (annually) or more often if you have foot problems. If you have foot problems, report any cuts, sores, or bruises to your health care provider immediately. Contact a health care provider if:  You have a medical condition that increases your risk of infection and you have any cuts, sores, or bruises on your feet.  You have an injury that is not   healing.  You have redness on your legs or feet.  You feel burning or tingling in your legs or feet.  You have pain or cramps in your legs and feet.  Your legs or feet are numb.  Your feet always feel cold.  You have pain around a toenail. Get help right away if:  You have a wound, scrape, corn, or callus on your foot and: ? You have pain, swelling, or redness that gets worse. ? You have fluid or blood coming from the wound, scrape, corn, or callus. ? Your wound, scrape, corn, or callus feels warm to the touch. ? You have pus or a bad smell coming from the wound, scrape, corn, or callus. ? You have a fever. ? You have a red line going up your leg. Summary  Check your feet every day  for cuts, sores, red spots, swelling, and blisters.  Moisturize feet and legs daily.  Wear shoes that fit properly and have enough cushioning.  If you have foot problems, report any cuts, sores, or bruises to your health care provider immediately.  Schedule a complete foot exam at least once a year (annually) or more often if you have foot problems. This information is not intended to replace advice given to you by your health care provider. Make sure you discuss any questions you have with your health care provider. Document Revised: 11/25/2018 Document Reviewed: 04/05/2016 Elsevier Patient Education  2020 [employer]  

## 2019-03-25 DIAGNOSIS — L821 Other seborrheic keratosis: Secondary | ICD-10-CM | POA: Diagnosis not present

## 2019-03-25 DIAGNOSIS — D485 Neoplasm of uncertain behavior of skin: Secondary | ICD-10-CM | POA: Diagnosis not present

## 2019-03-25 DIAGNOSIS — Z85828 Personal history of other malignant neoplasm of skin: Secondary | ICD-10-CM | POA: Diagnosis not present

## 2019-03-25 DIAGNOSIS — D0439 Carcinoma in situ of skin of other parts of face: Secondary | ICD-10-CM | POA: Diagnosis not present

## 2019-03-25 DIAGNOSIS — L570 Actinic keratosis: Secondary | ICD-10-CM | POA: Diagnosis not present

## 2019-03-25 DIAGNOSIS — L918 Other hypertrophic disorders of the skin: Secondary | ICD-10-CM | POA: Diagnosis not present

## 2019-03-31 ENCOUNTER — Ambulatory Visit (INDEPENDENT_AMBULATORY_CARE_PROVIDER_SITE_OTHER): Payer: PPO | Admitting: *Deleted

## 2019-03-31 DIAGNOSIS — I5032 Chronic diastolic (congestive) heart failure: Secondary | ICD-10-CM | POA: Diagnosis not present

## 2019-03-31 LAB — CUP PACEART REMOTE DEVICE CHECK
Battery Remaining Longevity: 118 mo
Battery Voltage: 3 V
Brady Statistic AP VP Percent: 0 %
Brady Statistic AP VS Percent: 0 %
Brady Statistic AS VP Percent: 0 %
Brady Statistic AS VS Percent: 0 %
Brady Statistic RA Percent Paced: 0.19 %
Brady Statistic RV Percent Paced: 99.98 %
Date Time Interrogation Session: 20210113082840
Implantable Lead Implant Date: 20180706
Implantable Lead Implant Date: 20180706
Implantable Lead Location: 753859
Implantable Lead Location: 753860
Implantable Lead Model: 5076
Implantable Lead Model: 5076
Implantable Pulse Generator Implant Date: 20180706
Lead Channel Impedance Value: 323 Ohm
Lead Channel Impedance Value: 513 Ohm
Lead Channel Impedance Value: 513 Ohm
Lead Channel Impedance Value: 570 Ohm
Lead Channel Pacing Threshold Amplitude: 0.5 V
Lead Channel Pacing Threshold Pulse Width: 0.4 ms
Lead Channel Sensing Intrinsic Amplitude: 1.375 mV
Lead Channel Sensing Intrinsic Amplitude: 1.375 mV
Lead Channel Sensing Intrinsic Amplitude: 9.125 mV
Lead Channel Sensing Intrinsic Amplitude: 9.125 mV
Lead Channel Setting Pacing Amplitude: 2 V
Lead Channel Setting Pacing Amplitude: 2.5 V
Lead Channel Setting Pacing Pulse Width: 0.4 ms
Lead Channel Setting Sensing Sensitivity: 2.8 mV

## 2019-04-02 ENCOUNTER — Other Ambulatory Visit: Payer: Self-pay

## 2019-04-05 ENCOUNTER — Other Ambulatory Visit: Payer: Self-pay

## 2019-04-05 ENCOUNTER — Ambulatory Visit (INDEPENDENT_AMBULATORY_CARE_PROVIDER_SITE_OTHER): Payer: PPO | Admitting: Family Medicine

## 2019-04-05 ENCOUNTER — Encounter: Payer: Self-pay | Admitting: Family Medicine

## 2019-04-05 DIAGNOSIS — N183 Chronic kidney disease, stage 3 unspecified: Secondary | ICD-10-CM | POA: Diagnosis not present

## 2019-04-05 DIAGNOSIS — I10 Essential (primary) hypertension: Secondary | ICD-10-CM

## 2019-04-05 DIAGNOSIS — E1169 Type 2 diabetes mellitus with other specified complication: Secondary | ICD-10-CM | POA: Diagnosis not present

## 2019-04-05 DIAGNOSIS — E669 Obesity, unspecified: Secondary | ICD-10-CM | POA: Diagnosis not present

## 2019-04-05 DIAGNOSIS — I5032 Chronic diastolic (congestive) heart failure: Secondary | ICD-10-CM

## 2019-04-05 DIAGNOSIS — E785 Hyperlipidemia, unspecified: Secondary | ICD-10-CM

## 2019-04-05 DIAGNOSIS — E559 Vitamin D deficiency, unspecified: Secondary | ICD-10-CM

## 2019-04-05 NOTE — Assessment & Plan Note (Signed)
No recent exacerbation, following with cardiology

## 2019-04-05 NOTE — Progress Notes (Signed)
Subjective:    Patient ID: Vincent Allen, male    DOB: 1934-01-02, 86 y.o.   MRN: [id number]  Chief Complaint  Patient presents with  . Follow-up    HPI Patient is in today for follow up on chronic medical concerns. Overall he is doing well. No recent febrile illness or hospitalizations. His sugars are hanging between 120 to 140. Notes some urinary urgency at times with some leakage but no dysuria or hematuria. He is eating well and mostly staying inside. Denies CP/palp/SOB/HA/congestion/fevers/GI c/o. Taking meds as prescribed  Past Medical History:  Diagnosis Date  . Arthritis   . Bradycardia   . Cataract   . Chronic kidney disease stage III (GFR 30-59 ml/min) 05/03/2012  . Colon polyps   . Complication of anesthesia    emesis  . Diabetes mellitus   . GERD (gastroesophageal reflux disease)   . HOH (hard of hearing)   . HTN (hypertension) 01/13/2013  . Hyperlipidemia   . Hypertension   . Pacemaker 12/2016  . Persistent atrial fibrillation ([place])   . Presence of permanent cardiac pacemaker 09/20/2016  . Symptomatic bradycardia   . Vitamin D deficiency 07/17/2015    Past Surgical History:  Procedure Laterality Date  . ANKLE ARTHROPLASTY    . CARDIOVERSION N/A 08/20/2016   Procedure: CARDIOVERSION;  Surgeon: Sanda Klein, MD;  Location: [facility];  Service: Cardiovascular;  Laterality: N/A;  . CATARACT EXTRACTION  2010, 2014  . CATARACT EXTRACTION  02/28/14  . CHOLECYSTECTOMY    . CHOLECYSTECTOMY N/A 05/03/2012   Procedure: LAPAROSCOPIC CHOLECYSTECTOMY WITH INTRAOPERATIVE CHOLANGIOGRAM;  Surgeon: [name], MD;  Location: [facility];  Service: General;  Laterality: N/A;  . INSERT / REPLACE / REMOVE PACEMAKER  09/20/2016  . PACEMAKER IMPLANT N/A 09/20/2016   Procedure: Pacemaker Implant;  Surgeon: Deboraha Sprang, MD;  Location: [facility];  Service: Cardiovascular;  Laterality: N/A;  . ROTATOR CUFF REPAIR    . SHOULDER SURGERY    . TONSILLECTOMY      Family  History  Problem Relation Age of Onset  . Cancer Mother        colon, breast, pancreas, skin cancer  . Heart disease Father        heart valve replaced  . Stroke Father   . Diabetes Father   . Cancer Paternal Grandmother        colon  . Obesity Son   . Heart disease Son        bradycardia    Social History   Socioeconomic History  . Marital status: Widowed    Spouse name: Not on file  . Number of children: Not on file  . Years of education: Not on file  . Highest education level: Not on file  Occupational History  . Not on file  Tobacco Use  . Smoking status: Never Smoker  . Smokeless tobacco: Never Used  Substance and Sexual Activity  . Alcohol use: Yes    Alcohol/week: 1.0 standard drinks    Types: 1 Glasses of wine per week  . Drug use: No  . 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.  Other Topics Concern  . Not on file  Social History Narrative  . Not on file   Social Determinants of Health   Financial Resource Strain:   . Difficulty of Paying Living Expenses: Not on file  Food  Insecurity:   . Worried About Charity fundraiser in the Last Year: Not on file  . Ran Out of Food in the Last Year: Not on file  Transportation Needs:   . Lack of Transportation (Medical): Not on file  . Lack of Transportation (Non-Medical): Not on file  Physical Activity:   . Days of Exercise per Week: Not on file  . Minutes of Exercise per Session: Not on file  Stress:   . Feeling of Stress : Not on file  Social Connections:   . Frequency of Communication with Friends and Family: Not on file  . Frequency of Social Gatherings with Friends and Family: Not on file  . Attends Religious Services: Not on file  . Active Member of Clubs or Organizations: Not on file  . Attends Archivist Meetings: Not on file  . Marital Status: Not on file  Intimate Partner Violence:     . Fear of Current or Ex-Partner: Not on file  . Emotionally Abused: Not on file  . Physically Abused: Not on file  . Sexually Abused: Not on file    Outpatient Medications Prior to Visit  Medication Sig Dispense Refill  . amLODipine (NORVASC) 5 MG tablet TAKE 1 TABLET BY MOUTH EVERY DAY 90 tablet 1  . atorvastatin (LIPITOR) 20 MG tablet Take 1 tablet (20 mg total) by mouth daily. 90 tablet 3  . Calcium 600 MG tablet Take 1 tablet (600 mg total) by mouth 2 (two) times daily. 60 tablet 0  . ELIQUIS 2.5 MG TABS tablet TAKE 1 TABLET BY MOUTH TWICE A DAY 60 tablet 5  . furosemide (LASIX) 20 MG tablet TAKE 1 TABLET (20 MG TOTAL) BY MOUTH DAILY AS NEEDED FOR EDEMA (WEIGHT GAIN >3LBS IN 24 HRS, SOB). 30 tablet 2  . glipiZIDE (GLUCOTROL) 10 MG tablet TAKE 2 TABLETS BY MOUTH TWICE A DAY BEFORE MEALS 360 tablet 0  . glucose blood (ONE TOUCH ULTRA TEST) test strip USE 1 STRIP 2 TIMES DAILY TO CHECK BLOOD SUGAR DX E08.22, N18.3 300 each 1  . JANUVIA 100 MG tablet TAKE 1/2 TO 1 TABLET BY MOUTH DAILY 90 tablet 1  . Lancets (ONETOUCH ULTRASOFT) lancets Use as instructed to check blood sugar twice a day as needed Dx Code E11.9 100 each 6  . losartan (COZAAR) 25 MG tablet Take 1 tablet (25 mg total) by mouth daily. 1 tablet 0  . losartan (COZAAR) 50 MG tablet Take 50 mg by mouth daily.    . meclizine (ANTIVERT) 25 MG tablet Take 1 tablet (25 mg total) by mouth 3 (three) times daily as needed for dizziness. 30 tablet 0  . metFORMIN (GLUCOPHAGE-XR) 500 MG 24 hr tablet Take 1 tablet (500 mg total) by mouth daily with breakfast. 180 tablet 1  . mupirocin ointment (BACTROBAN) 2 % APPLY TO AFFECTED AREA EVERY DAY    . neomycin-polymyxin-hydrocortisone (CORTISPORIN) OTIC solution Place 4 drops into both ears 3 (three) times daily. 10 mL 0  . triamterene-hydrochlorothiazide (MAXZIDE) 75-50 MG tablet TAKE 1 TABLET BY MOUTH EVERY DAY 90 tablet 1   No facility-administered medications prior to visit.    Allergies   Allergen Reactions  . Penicillins Hives and Itching    Has patient had a PCN reaction causing immediate rash, facial/tongue/throat swelling, SOB or lightheadedness with hypotension:Yes Has patient had a PCN reaction causing severe rash involving mucus membranes or skin necrosis:No Has patient had a PCN reaction that required hospitalization:No Has patient had a PCN  reaction occurring within the last 10 years:No If all of the above answers are "NO", then may proceed with Cephalosporin use.   . Verapamil     Junctional rhythm  . Influenza Vaccines Rash    Review of Systems  Constitutional: Negative for fever and malaise/fatigue.  HENT: Negative for congestion.   Eyes: Negative for blurred vision.  Respiratory: Negative for shortness of breath.   Cardiovascular: Negative for chest pain, palpitations and leg swelling.  Gastrointestinal: Negative for abdominal pain, blood in stool and nausea.  Genitourinary: Positive for urgency. Negative for dysuria, flank pain, frequency and hematuria.  Musculoskeletal: Negative for falls.  Skin: Negative for rash.  Neurological: Negative for dizziness, loss of consciousness and headaches.  Endo/Heme/Allergies: Negative for environmental allergies.  Psychiatric/Behavioral: Negative for depression. The patient is not nervous/anxious.        Objective:    Physical Exam Vitals and nursing note reviewed.  Constitutional:      General: He is not in acute distress.    Appearance: He is well-developed.  HENT:     Head: Normocephalic and atraumatic.     Nose: Nose normal.  Eyes:     General:        Right eye: No discharge.        Left eye: No discharge.  Cardiovascular:     Rate and Rhythm: Normal rate.     Heart sounds: No murmur.  Pulmonary:     Effort: Pulmonary effort is normal.     Breath sounds: Normal breath sounds.  Abdominal:     General: Bowel sounds are normal.     Palpations: Abdomen is soft.     Tenderness: There is no  abdominal tenderness.  Musculoskeletal:     Cervical back: Normal range of motion and neck supple.  Skin:    General: Skin is warm and dry.  Neurological:     Mental Status: He is alert and oriented to person, place, and time.     BP 138/69 (BP Location: Left Arm, Patient Position: Sitting, Cuff Size: Normal)   Pulse 81   Temp 98.3 F (36.8 C) (Temporal)   Resp 18   Wt 226 lb 12.8 oz (102.9 kg)   SpO2 98%   BMI 30.76 kg/m  Wt Readings from Last 3 Encounters:  04/05/19 226 lb 12.8 oz (102.9 kg)  11/03/18 226 lb 9.6 oz (102.8 kg)  10/28/18 225 lb (102.1 kg)    Diabetic Foot Exam - Simple   No data filed     Lab Results  Component Value Date   WBC 7.3 02/15/2019   HGB 15.2 02/15/2019   HCT 46.7 02/15/2019   PLT 155.0 02/15/2019   GLUCOSE 127 (H) 02/15/2019   CHOL 108 02/15/2019   TRIG 134.0 02/15/2019   HDL 32.30 (L) 02/15/2019   LDLDIRECT 65.0 11/03/2018   LDLCALC 49 02/15/2019   ALT 15 02/15/2019   AST 14 02/15/2019   NA 138 02/15/2019   K 4.4 02/15/2019   CL 104 02/15/2019   CREATININE 1.51 (H) 02/15/2019   BUN 31 (H) 02/15/2019   CO2 23 02/15/2019   TSH 2.60 02/15/2019   PSA 1.18 10/07/2012   INR 1.0 08/15/2016   HGBA1C 8.4 (H) 02/15/2019   MICROALBUR 26.7 (H) 07/15/2016    Lab Results  Component Value Date   TSH 2.60 02/15/2019   Lab Results  Component Value Date   WBC 7.3 02/15/2019   HGB 15.2 02/15/2019   HCT 46.7 02/15/2019   MCV  86.4 02/15/2019   PLT 155.0 02/15/2019   Lab Results  Component Value Date   NA 138 02/15/2019   K 4.4 02/15/2019   CO2 23 02/15/2019   GLUCOSE 127 (H) 02/15/2019   BUN 31 (H) 02/15/2019   CREATININE 1.51 (H) 02/15/2019   BILITOT 1.0 02/15/2019   ALKPHOS 45 02/15/2019   AST 14 02/15/2019   ALT 15 02/15/2019   PROT 6.4 02/15/2019   ALBUMIN 3.6 02/15/2019   CALCIUM 9.2 02/15/2019   ANIONGAP 8 07/10/2016   GFR 44.29 (L) 02/15/2019   Lab Results  Component Value Date   CHOL 108 02/15/2019   Lab  Results  Component Value Date   HDL 32.30 (L) 02/15/2019   Lab Results  Component Value Date   LDLCALC 49 02/15/2019   Lab Results  Component Value Date   TRIG 134.0 02/15/2019   Lab Results  Component Value Date   CHOLHDL 3 02/15/2019   Lab Results  Component Value Date   HGBA1C 8.4 (H) 02/15/2019       Assessment & Plan:   Problem List Items Addressed This Visit    Hyperlipidemia LDL goal <70 (Chronic)    Tolerating statin, encouraged heart healthy diet, avoid trans fats, minimize simple carbs and saturated fats. Increase exercise as tolerated      Chronic kidney disease stage III (GFR 30-59 ml/min) (Chronic)    Hydrate and monitor      Essential hypertension    Well controlled, no changes to meds. Encouraged heart healthy diet such as the DASH diet and exercise as tolerated.       Diabetes mellitus type 2 in obese (HCC)    hgba1c 8.4 but FSG generally 120 to 140, minimize simple carbs. Increase exercise as tolerated. Continue current meds      Vitamin D deficiency    Supplement and monitor      Chronic diastolic CHF (congestive heart failure) (HCC)    No recent exacerbation, following with cardiology         I am having Taiten C. Alroy Dust "[name]" maintain his calcium carbonate, meclizine, neomycin-polymyxin-hydrocortisone, furosemide, metFORMIN, atorvastatin, glucose blood, onetouch ultrasoft, losartan, Januvia, triamterene-hydrochlorothiazide, losartan, amLODipine, Eliquis, glipiZIDE, and mupirocin ointment.  No orders of the defined types were placed in this encounter.    [name], MD

## 2019-04-05 NOTE — Assessment & Plan Note (Signed)
Tolerating statin, encouraged heart healthy diet, avoid trans fats, minimize simple carbs and saturated fats. Increase exercise as tolerated 

## 2019-04-05 NOTE — Patient Instructions (Addendum)
Multivitamin with mineral, selenium Vitamin D 1000 IU daily Probiotic daily (align, PHillips colon health, culturelle)  At bedtime Melatonin 2-5 mg at bedtime COVID-19 Frequently Asked Questions COVID-19 (coronavirus disease) is an infection that is caused by a large family of viruses. Some viruses cause illness in people and others cause illness in animals like camels, cats, and bats. In some cases, the viruses that cause illness in animals can spread to humans. Where did the coronavirus come from? In December 2019, [place] told the [facility]) of several cases of lung disease (human respiratory illness). These cases were linked to an open seafood and livestock market in the city of [place]. The link to the seafood and livestock market suggests that the virus may have spread from animals to humans. However, since that first outbreak in December, the virus has also been shown to spread from person to person. What is the name of the disease and the virus? Disease name Early on, this disease was called novel coronavirus. This is because scientists determined that the disease was caused by a new (novel) respiratory virus. The World Health Organization [facility]) has now named the disease COVID-19, or coronavirus disease. Virus name The virus that causes the disease is called severe acute respiratory syndrome coronavirus 2 (SARS-CoV-2). More information on disease and virus naming World Health Organization [facility]): www.who.int/emergencies/diseases/novel-coronavirus-2019/technical-guidance/naming-the-coronavirus-disease-(covid-2019)-and-the-virus-that-causes-it Who is at risk for complications from coronavirus disease? Some people may be at higher risk for complications from coronavirus disease. This includes older adults and people who have chronic diseases, such as heart disease, diabetes, and lung disease. If you are at higher risk for complications, take these extra precautions:  Stay  home as much as possible.  Avoid social gatherings and travel.  Avoid close contact with others. Stay at least 6 ft (2 m) away from others, if possible.  Wash your hands often with soap and water for at least 20 seconds.  Avoid touching your face, mouth, nose, or eyes.  Keep supplies on hand at home, such as food, medicine, and cleaning supplies.  If you must go out in public, wear a cloth face covering or face mask. Make sure your mask covers your nose and mouth. How does coronavirus disease spread? The virus that causes coronavirus disease spreads easily from person to person (is contagious). You may catch the virus by:  Breathing in droplets from an infected person. Droplets can be spread by a person breathing, speaking, singing, coughing, or sneezing.  Touching something, like a table or a doorknob, that was exposed to the virus (contaminated) and then touching your mouth, nose, or eyes. Can I get the virus from touching surfaces or objects? There is still a lot that we do not know about the virus that causes coronavirus disease. Scientists are basing a lot of information on what they know about similar viruses, such as:  Viruses cannot generally survive on surfaces for long. They need a human body (host) to survive.  It is more likely that the virus is spread by close contact with people who are sick (direct contact), such as through: ? Shaking hands or hugging. ? Breathing in respiratory droplets that travel through the air. Droplets can be spread by a person breathing, speaking, singing, coughing, or sneezing.  It is less likely that the virus is spread when a person touches a surface or object that has the virus on it (indirect contact). The virus may be able to enter the body if the person touches a surface or  object and then touches his or her face, eyes, nose, or mouth. Can a person spread the virus without having symptoms of the disease? It may be possible for the virus to  spread before a person has symptoms of the disease, but this is most likely not the main way the virus is spreading. It is more likely for the virus to spread by being in close contact with people who are sick and breathing in the respiratory droplets spread by a person breathing, speaking, singing, coughing, or sneezing. What are the symptoms of coronavirus disease? Symptoms vary from person to person and can range from mild to severe. Symptoms may include:  Fever or chills.  Cough.  Difficulty breathing or feeling short of breath.  Headaches, body aches, or muscle aches.  Runny or stuffy (congested) nose.  Sore throat.  New loss of taste or smell.  Nausea, vomiting, or diarrhea. These symptoms can appear anywhere from 2 to 14 days after you have been exposed to the virus. Some people may not have any symptoms. If you develop symptoms, call your health care provider. People with severe symptoms may need hospital care. Should I be tested for this virus? Your health care provider will decide whether to test you based on your symptoms, history of exposure, and your risk factors. How does a health care provider test for this virus? Health care providers will collect samples to send for testing. Samples may include:  Taking a swab of fluid from the back of your nose and throat, your nose, or your throat.  Taking fluid from the lungs by having you cough up mucus (sputum) into a sterile cup.  Taking a blood sample. Is there a treatment or vaccine for this virus? Currently, there is no vaccine to prevent coronavirus disease. Also, there are no medicines like antibiotics or antivirals to treat the virus. A person who becomes sick is given supportive care, which means rest and fluids. A person may also relieve his or her symptoms by using over-the-counter medicines that treat sneezing, coughing, and runny nose. These are the same medicines that a person takes for the common cold. If you  develop symptoms, call your health care provider. People with severe symptoms may need hospital care. What can I do to protect myself and my family from this virus?     You can protect yourself and your family by taking the same actions that you would take to prevent the spread of other viruses. Take the following actions:  Wash your hands often with soap and water for at least 20 seconds. If soap and water are not available, use alcohol-based hand sanitizer.  Avoid touching your face, mouth, nose, or eyes.  Cough or sneeze into a tissue, sleeve, or elbow. Do not cough or sneeze into your hand or the air. ? If you cough or sneeze into a tissue, throw it away immediately and wash your hands.  Disinfect objects and surfaces that you frequently touch every day.  Stay away from people who are sick.  Avoid going out in public, follow guidance from your state and local health authorities.  Avoid crowded indoor spaces. Stay at least 6 ft (2 m) away from others.  If you must go out in public, wear a cloth face covering or face mask. Make sure your mask covers your nose and mouth.  Stay home if you are sick, except to get medical care. Call your health care provider before you get medical care. Your health care  provider will tell you how long to stay home.  Make sure your vaccines are up to date. Ask your health care provider what vaccines you need. What should I do if I need to travel? Follow travel recommendations from your local health authority, the CDC, and WHO. Travel information and advice  Centers for Disease Control and Prevention (CDC): BodyEditor.hu  World Health Organization [facility]): ThirdIncome.ca Know the risks and take action to protect your health  You are at higher risk of getting coronavirus disease if you are traveling to areas with an outbreak or if you are exposed to travelers  from areas with an outbreak.  Wash your hands often and practice good hygiene to lower the risk of catching or spreading the virus. What should I do if I am sick? General instructions to stop the spread of infection  Wash your hands often with soap and water for at least 20 seconds. If soap and water are not available, use alcohol-based hand sanitizer.  Cough or sneeze into a tissue, sleeve, or elbow. Do not cough or sneeze into your hand or the air.  If you cough or sneeze into a tissue, throw it away immediately and wash your hands.  Stay home unless you must get medical care. Call your health care provider or local health authority before you get medical care.  Avoid public areas. Do not take public transportation, if possible.  If you can, wear a mask if you must go out of the house or if you are in close contact with someone who is not sick. Make sure your mask covers your nose and mouth. Keep your home clean  Disinfect objects and surfaces that are frequently touched every day. This may include: ? Counters and tables. ? Doorknobs and light switches. ? Sinks and faucets. ? Electronics such as phones, remote controls, keyboards, computers, and tablets.  Wash dishes in hot, soapy water or use a dishwasher. Air-dry your dishes.  Wash laundry in hot water. Prevent infecting other household members  Let healthy household members care for children and pets, if possible. If you have to care for children or pets, wash your hands often and wear a mask.  Sleep in a different bedroom or bed, if possible.  Do not share personal items, such as razors, toothbrushes, deodorant, combs, brushes, towels, and washcloths. Where to find more information Centers for Disease Control and Prevention (CDC)  Information and news updates: https://www.butler-gonzalez.com/ World Health Organization [facility])  Information and news updates:  MissExecutive.com.ee  Coronavirus health topic: https://www.castaneda.info/  Questions and answers on COVID-19: OpportunityDebt.at  Global tracker: who.sprinklr.com American Academy of Pediatrics (AAP)  Information for families: www.healthychildren.org/English/health-issues/conditions/chest-lungs/Pages/2019-Novel-Coronavirus.aspx The coronavirus situation is changing rapidly. Check your local health authority website or the CDC and [facility] websites for updates and news. When should I contact a health care provider?  Contact your health care provider if you have symptoms of an infection, such as fever or cough, and you: ? Have been near anyone who is known to have coronavirus disease. ? Have come into contact with a person who is suspected to have coronavirus disease. ? Have traveled to an area where there is an outbreak of COVID-19. When should I get emergency medical care?  Get help right away by calling your local emergency services (911 in the [place] if you have: ? Trouble breathing. ? Pain or pressure in your chest. ? Confusion. ? Blue-tinged lips and fingernails. ? Difficulty waking from sleep. ? Symptoms that get worse. Let the emergency  medical personnel know if you think you have coronavirus disease. Summary  A new respiratory virus is spreading from person to person and causing COVID-19 (coronavirus disease).  The virus that causes COVID-19 appears to spread easily. It spreads from one person to another through droplets from breathing, speaking, singing, coughing, or sneezing.  Older adults and those with chronic diseases are at higher risk of disease. If you are at higher risk for complications, take extra precautions.  There is currently no vaccine to prevent coronavirus disease. There are no medicines, such as antibiotics or antivirals, to treat the virus.  You can protect yourself and your family  by washing your hands often, avoiding touching your face, and covering your coughs and sneezes. This information is not intended to replace advice given to you by your health care provider. Make sure you discuss any questions you have with your health care provider. Document Revised: 01/01/2019 Document Reviewed: 06/30/2018 Elsevier Patient Education  [place].

## 2019-04-05 NOTE — Assessment & Plan Note (Signed)
hgba1c 8.4 but FSG generally 120 to 140, minimize simple carbs. Increase exercise as tolerated. Continue current meds

## 2019-04-05 NOTE — Assessment & Plan Note (Signed)
Supplement and monitor 

## 2019-04-05 NOTE — Assessment & Plan Note (Signed)
Well controlled, no changes to meds. Encouraged heart healthy diet such as the DASH diet and exercise as tolerated.  °

## 2019-04-05 NOTE — Assessment & Plan Note (Signed)
Hydrate and monitor 

## 2019-05-19 ENCOUNTER — Other Ambulatory Visit: Payer: Self-pay | Admitting: Family Medicine

## 2019-05-26 DIAGNOSIS — H26493 Other secondary cataract, bilateral: Secondary | ICD-10-CM | POA: Diagnosis not present

## 2019-05-26 DIAGNOSIS — Z961 Presence of intraocular lens: Secondary | ICD-10-CM | POA: Diagnosis not present

## 2019-05-26 DIAGNOSIS — H47323 Drusen of optic disc, bilateral: Secondary | ICD-10-CM | POA: Diagnosis not present

## 2019-05-26 DIAGNOSIS — H353131 Nonexudative age-related macular degeneration, bilateral, early dry stage: Secondary | ICD-10-CM | POA: Diagnosis not present

## 2019-05-26 DIAGNOSIS — E119 Type 2 diabetes mellitus without complications: Secondary | ICD-10-CM | POA: Diagnosis not present

## 2019-05-26 DIAGNOSIS — H02831 Dermatochalasis of right upper eyelid: Secondary | ICD-10-CM | POA: Diagnosis not present

## 2019-05-26 DIAGNOSIS — H04123 Dry eye syndrome of bilateral lacrimal glands: Secondary | ICD-10-CM | POA: Diagnosis not present

## 2019-05-26 DIAGNOSIS — H02834 Dermatochalasis of left upper eyelid: Secondary | ICD-10-CM | POA: Diagnosis not present

## 2019-05-26 DIAGNOSIS — H35372 Puckering of macula, left eye: Secondary | ICD-10-CM | POA: Diagnosis not present

## 2019-05-26 LAB — HM DIABETES EYE EXAM

## 2019-05-28 ENCOUNTER — Encounter: Payer: Self-pay | Admitting: *Deleted

## 2019-05-28 ENCOUNTER — Other Ambulatory Visit: Payer: Self-pay | Admitting: Family Medicine

## 2019-05-31 ENCOUNTER — Other Ambulatory Visit: Payer: Self-pay | Admitting: Family Medicine

## 2019-06-02 ENCOUNTER — Other Ambulatory Visit: Payer: Self-pay | Admitting: Emergency Medicine

## 2019-06-02 DIAGNOSIS — I10 Essential (primary) hypertension: Secondary | ICD-10-CM

## 2019-06-02 DIAGNOSIS — E785 Hyperlipidemia, unspecified: Secondary | ICD-10-CM

## 2019-06-02 DIAGNOSIS — E0822 Diabetes mellitus due to underlying condition with diabetic chronic kidney disease: Secondary | ICD-10-CM

## 2019-06-03 ENCOUNTER — Ambulatory Visit: Payer: PPO

## 2019-06-03 ENCOUNTER — Other Ambulatory Visit: Payer: PPO

## 2019-06-10 ENCOUNTER — Ambulatory Visit (INDEPENDENT_AMBULATORY_CARE_PROVIDER_SITE_OTHER): Payer: PPO | Admitting: Family Medicine

## 2019-06-10 ENCOUNTER — Other Ambulatory Visit (INDEPENDENT_AMBULATORY_CARE_PROVIDER_SITE_OTHER): Payer: PPO

## 2019-06-10 ENCOUNTER — Other Ambulatory Visit: Payer: Self-pay

## 2019-06-10 VITALS — BP 140/84 | HR 81

## 2019-06-10 DIAGNOSIS — N183 Chronic kidney disease, stage 3 unspecified: Secondary | ICD-10-CM | POA: Diagnosis not present

## 2019-06-10 DIAGNOSIS — I10 Essential (primary) hypertension: Secondary | ICD-10-CM

## 2019-06-10 DIAGNOSIS — E785 Hyperlipidemia, unspecified: Secondary | ICD-10-CM | POA: Diagnosis not present

## 2019-06-10 DIAGNOSIS — E0822 Diabetes mellitus due to underlying condition with diabetic chronic kidney disease: Secondary | ICD-10-CM

## 2019-06-10 LAB — CBC
HCT: 45.6 % (ref 39.0–52.0)
Hemoglobin: 15.2 g/dL (ref 13.0–17.0)
MCHC: 33.2 g/dL (ref 30.0–36.0)
MCV: 86.5 fl (ref 78.0–100.0)
Platelets: 196 10*3/uL (ref 150.0–400.0)
RBC: 5.28 Mil/uL (ref 4.22–5.81)
RDW: 14.1 % (ref 11.5–15.5)
WBC: 8.1 10*3/uL (ref 4.0–10.5)

## 2019-06-10 LAB — COMPREHENSIVE METABOLIC PANEL
ALT: 19 U/L (ref 0–53)
AST: 17 U/L (ref 0–37)
Albumin: 4 g/dL (ref 3.5–5.2)
Alkaline Phosphatase: 41 U/L (ref 39–117)
BUN: 36 mg/dL — ABNORMAL HIGH (ref 6–23)
CO2: 26 mEq/L (ref 19–32)
Calcium: 10 mg/dL (ref 8.4–10.5)
Chloride: 102 mEq/L (ref 96–112)
Creatinine, Ser: 1.73 mg/dL — ABNORMAL HIGH (ref 0.40–1.50)
GFR: 37.83 mL/min — ABNORMAL LOW (ref >60.00)
Glucose, Bld: 90 mg/dL (ref 70–99)
Potassium: 4 mEq/L (ref 3.5–5.1)
Sodium: 137 mEq/L (ref 135–145)
Total Bilirubin: 0.8 mg/dL (ref 0.2–1.2)
Total Protein: 6.9 g/dL (ref 6.0–8.3)

## 2019-06-10 LAB — LIPID PANEL
Cholesterol: 178 mg/dL (ref 0–200)
HDL: 39.7 mg/dL (ref >39.00)
LDL Cholesterol: 106 mg/dL — ABNORMAL HIGH (ref 0–99)
NonHDL: 138.56
Total CHOL/HDL Ratio: 4
Triglycerides: 165 mg/dL — ABNORMAL HIGH (ref 0.0–149.0)
VLDL: 33 mg/dL (ref 0.0–40.0)

## 2019-06-10 LAB — HEMOGLOBIN A1C: Hgb A1c MFr Bld: 7.4 % — ABNORMAL HIGH (ref 4.6–6.5)

## 2019-06-10 NOTE — Progress Notes (Signed)
Patient notified and verbalized understanding. 

## 2019-06-10 NOTE — Progress Notes (Addendum)
Pt here for Blood pressure check per   Pt currently takes:amlodipine 5 mg, patient states he takes losartan 25 mg daily however 50 mg is on his list as well.  Could you please clarify on what he should be taking.   Pt reports compliance with medication.  BP today @ = 140/84 HR = 81   BP Readings from Last 3 Encounters:  04/05/19 138/69  12/04/18 126/74  11/03/18 128/70    Please advise and I will call the patient. Patient also brought glucose readings I have placed in folder for review.   Nursing check note reviewed. Agree with documention and plan. Blood pressure adequately controlled, no change in therapy, keep next appt in June as scheduled

## 2019-06-12 ENCOUNTER — Other Ambulatory Visit: Payer: Self-pay | Admitting: Internal Medicine

## 2019-06-15 ENCOUNTER — Other Ambulatory Visit: Payer: Self-pay | Admitting: Family Medicine

## 2019-06-17 ENCOUNTER — Other Ambulatory Visit: Payer: Self-pay | Admitting: Family Medicine

## 2019-06-23 ENCOUNTER — Encounter: Payer: Self-pay | Admitting: Family Medicine

## 2019-06-23 ENCOUNTER — Other Ambulatory Visit: Payer: Self-pay

## 2019-06-23 ENCOUNTER — Ambulatory Visit: Payer: PPO | Admitting: Podiatry

## 2019-06-23 VITALS — Temp 97.3°F

## 2019-06-23 DIAGNOSIS — M79675 Pain in left toe(s): Secondary | ICD-10-CM | POA: Diagnosis not present

## 2019-06-23 DIAGNOSIS — N183 Chronic kidney disease, stage 3 unspecified: Secondary | ICD-10-CM | POA: Diagnosis not present

## 2019-06-23 DIAGNOSIS — E0822 Diabetes mellitus due to underlying condition with diabetic chronic kidney disease: Secondary | ICD-10-CM

## 2019-06-23 DIAGNOSIS — B351 Tinea unguium: Secondary | ICD-10-CM

## 2019-06-23 DIAGNOSIS — M79674 Pain in right toe(s): Secondary | ICD-10-CM | POA: Diagnosis not present

## 2019-06-23 NOTE — Patient Instructions (Signed)
Diabetes Mellitus and Foot Care Foot care is an important part of your health, especially when you have diabetes. Diabetes may cause you to have problems because of poor blood flow (circulation) to your feet and legs, which can cause your skin to:  Become thinner and drier.  Break more easily.  Heal more slowly.  Peel and crack. You may also have nerve damage (neuropathy) in your legs and feet, causing decreased feeling in them. This means that you may not notice minor injuries to your feet that could lead to more serious problems. Noticing and addressing any potential problems early is the best way to prevent future foot problems. How to care for your feet Foot hygiene  Wash your feet daily with warm water and mild soap. Do not use hot water. Then, pat your feet and the areas between your toes until they are completely dry. Do not soak your feet as this can dry your skin.  Trim your toenails straight across. Do not dig under them or around the cuticle. File the edges of your nails with an emery board or nail file.  Apply a moisturizing lotion or petroleum jelly to the skin on your feet and to dry, brittle toenails. Use lotion that does not contain alcohol and is unscented. Do not apply lotion between your toes. Shoes and socks  Wear clean socks or stockings every day. Make sure they are not too tight. Do not wear knee-high stockings since they may decrease blood flow to your legs.  Wear shoes that fit properly and have enough cushioning. Always look in your shoes before you put them on to be sure there are no objects inside.  To break in new shoes, wear them for just a few hours a day. This prevents injuries on your feet. Wounds, scrapes, corns, and calluses  Check your feet daily for blisters, cuts, bruises, sores, and redness. If you cannot see the bottom of your feet, use a mirror or ask someone for help.  Do not cut corns or calluses or try to remove them with medicine.  If you  find a minor scrape, cut, or break in the skin on your feet, keep it and the skin around it clean and dry. You may clean these areas with mild soap and water. Do not clean the area with peroxide, alcohol, or iodine.  If you have a wound, scrape, corn, or callus on your foot, look at it several times a day to make sure it is healing and not infected. Check for: ? Redness, swelling, or pain. ? Fluid or blood. ? Warmth. ? Pus or a bad smell. General instructions  Do not cross your legs. This may decrease blood flow to your feet.  Do not use heating pads or hot water bottles on your feet. They may burn your skin. If you have lost feeling in your feet or legs, you may not know this is happening until it is too late.  Protect your feet from hot and cold by wearing shoes, such as at the beach or on hot pavement.  Schedule a complete foot exam at least once a year (annually) or more often if you have foot problems. If you have foot problems, report any cuts, sores, or bruises to your health care provider immediately. Contact a health care provider if:  You have a medical condition that increases your risk of infection and you have any cuts, sores, or bruises on your feet.  You have an injury that is not   healing.  You have redness on your legs or feet.  You feel burning or tingling in your legs or feet.  You have pain or cramps in your legs and feet.  Your legs or feet are numb.  Your feet always feel cold.  You have pain around a toenail. Get help right away if:  You have a wound, scrape, corn, or callus on your foot and: ? You have pain, swelling, or redness that gets worse. ? You have fluid or blood coming from the wound, scrape, corn, or callus. ? Your wound, scrape, corn, or callus feels warm to the touch. ? You have pus or a bad smell coming from the wound, scrape, corn, or callus. ? You have a fever. ? You have a red line going up your leg. Summary  Check your feet every day  for cuts, sores, red spots, swelling, and blisters.  Moisturize feet and legs daily.  Wear shoes that fit properly and have enough cushioning.  If you have foot problems, report any cuts, sores, or bruises to your health care provider immediately.  Schedule a complete foot exam at least once a year (annually) or more often if you have foot problems. This information is not intended to replace advice given to you by your health care provider. Make sure you discuss any questions you have with your health care provider. Document Revised: 11/25/2018 Document Reviewed: 04/05/2016 Elsevier Patient Education  2020 [employer]  Onychomycosis/Fungal Toenails  WHAT IS IT? An infection that lies within the keratin of your nail plate that is caused by a fungus.  WHY ME? Fungal infections affect all ages, sexes, races, and creeds.  There may be many factors that predispose you to a fungal infection such as age, coexisting medical conditions such as diabetes, or an autoimmune disease; stress, medications, fatigue, genetics, etc.  Bottom line: fungus thrives in a warm, moist environment and your shoes offer such a location.  IS IT CONTAGIOUS? Theoretically, yes.  You do not want to share shoes, nail clippers or files with someone who has fungal toenails.  Walking around barefoot in the same room or sleeping in the same bed is unlikely to transfer the organism.  It is important to realize, however, that fungus can spread easily from one nail to the next on the same foot.  HOW DO WE TREAT THIS?  There are several ways to treat this condition.  Treatment may depend on many factors such as age, medications, pregnancy, liver and kidney conditions, etc.  It is best to ask your doctor which options are available to you.  5. No treatment.   Unlike many other medical concerns, you can live with this condition.  However for many people this can be a painful condition and may lead to ingrown toenails or a bacterial  infection.  It is recommended that you keep the nails cut short to help reduce the amount of fungal nail. 6. Topical treatment.  These range from herbal remedies to prescription strength nail lacquers.  About 40-50% effective, topicals require twice daily application for approximately 9 to 12 months or until an entirely new nail has grown out.  The most effective topicals are medical grade medications available through physicians offices. 7. Oral antifungal medications.  With an 80-90% cure rate, the most common oral medication requires 3 to 4 months of therapy and stays in your system for a year as the new nail grows out.  Oral antifungal medications do require blood work to make   sure it is a safe drug for you.  A liver function panel will be performed prior to starting the medication and after the first month of treatment.  It is important to have the blood work performed to avoid any harmful side effects.  In general, this medication safe but blood work is required. 8. Laser Therapy.  This treatment is performed by applying a specialized laser to the affected nail plate.  This therapy is noninvasive, fast, and non-painful.  It is not covered by insurance and is therefore, out of pocket.  The results have been very good with a 80-95% cure rate.  [facility] is the only practice in the area to offer this therapy. 9. Permanent Nail Avulsion.  Removing the entire nail so that a new nail will not grow back. 

## 2019-06-24 NOTE — Telephone Encounter (Signed)
Patient does not want to do insulin.  Appt made with Dr. [name] for next week to discuss options.

## 2019-06-28 ENCOUNTER — Encounter: Payer: Self-pay | Admitting: Podiatry

## 2019-06-28 NOTE — Progress Notes (Signed)
Subjective: Vincent Allen presents today for follow up of at risk foot care. Pt has h/o NIDDM with chronic kidney disease and painful mycotic nails b/l that are difficult to trim. Pain interferes with ambulation. Aggravating factors include wearing enclosed shoe gear. Pain is relieved with periodic professional debridement.   Allergies  Allergen Reactions  . Penicillins Hives and Itching    Has patient had a PCN reaction causing immediate rash, facial/tongue/throat swelling, SOB or lightheadedness with hypotension:Yes Has patient had a PCN reaction causing severe rash involving mucus membranes or skin necrosis:No Has patient had a PCN reaction that required hospitalization:No Has patient had a PCN reaction occurring within the last 10 years:No If all of the above answers are "NO", then may proceed with Cephalosporin use.   . Verapamil     Junctional rhythm  . Influenza Vaccines Rash     Objective: Vitals:   06/23/19 0820  Temp: (!) 97.3 F (36.3 C)    Pt 86 y.o. year old Caucasian male  in NAD. AAO x 3.   Vascular Examination:  Capillary fill time to digits <3 seconds b/l. Palpable DP pulses b/l. Palpable PT pulses b/l. Pedal hair absent b/l Skin temperature gradient within normal limits b/l.  Dermatological Examination: Pedal skin with normal turgor, texture and tone bilaterally. No open wounds bilaterally. No interdigital macerations bilaterally. Toenails 1-5 b/l elongated, dystrophic, thickened, crumbly with subungual debris and tenderness to dorsal palpation.  Musculoskeletal: Normal muscle strength 5/5 to all lower extremity muscle groups bilaterally, no gross bony deformities bilaterally and no pain crepitus or joint limitation noted with ROM b/l  Neurological: Protective sensation intact 5/5 intact bilaterally with 10g monofilament b/l Vibratory sensation intact b/l Babinski reflex negative b/l Clonus negative b/l  Assessment: 1. Pain due to onychomycosis of toenails  of both feet   2. Diabetes mellitus due to underlying condition with stage 3 chronic kidney disease, unspecified whether long term insulin use, unspecified whether stage 3a or 3b CKD ([place])    Plan: -Continue diabetic foot care principles. Literature dispensed on today.  -Toenails 1-5 b/l were debrided in length and girth with sterile nail nippers and dremel without iatrogenic bleeding.  -Patient to continue soft, supportive shoe gear daily. -Patient to report any pedal injuries to medical professional immediately. -Patient/POA to call should there be question/concern in the interim.  Return in about 4 months (around 10/23/2019) for diabetic nail trim.

## 2019-06-29 ENCOUNTER — Ambulatory Visit (INDEPENDENT_AMBULATORY_CARE_PROVIDER_SITE_OTHER): Payer: PPO | Admitting: Family Medicine

## 2019-06-29 ENCOUNTER — Other Ambulatory Visit: Payer: Self-pay

## 2019-06-29 ENCOUNTER — Encounter: Payer: Self-pay | Admitting: Family Medicine

## 2019-06-29 VITALS — BP 120/70 | HR 68 | Temp 98.1°F | Resp 12 | Ht 72.0 in | Wt 226.0 lb

## 2019-06-29 DIAGNOSIS — I10 Essential (primary) hypertension: Secondary | ICD-10-CM | POA: Diagnosis not present

## 2019-06-29 DIAGNOSIS — N183 Chronic kidney disease, stage 3 unspecified: Secondary | ICD-10-CM | POA: Diagnosis not present

## 2019-06-29 DIAGNOSIS — E559 Vitamin D deficiency, unspecified: Secondary | ICD-10-CM | POA: Diagnosis not present

## 2019-06-29 DIAGNOSIS — E0822 Diabetes mellitus due to underlying condition with diabetic chronic kidney disease: Secondary | ICD-10-CM | POA: Diagnosis not present

## 2019-06-29 LAB — COMPREHENSIVE METABOLIC PANEL
ALT: 23 U/L (ref 0–53)
AST: 20 U/L (ref 0–37)
Albumin: 4.1 g/dL (ref 3.5–5.2)
Alkaline Phosphatase: 47 U/L (ref 39–117)
BUN: 36 mg/dL — ABNORMAL HIGH (ref 6–23)
CO2: 24 mEq/L (ref 19–32)
Calcium: 9.6 mg/dL (ref 8.4–10.5)
Chloride: 101 mEq/L (ref 96–112)
Creatinine, Ser: 1.61 mg/dL — ABNORMAL HIGH (ref 0.40–1.50)
GFR: 41.1 mL/min — ABNORMAL LOW (ref >60.00)
Glucose, Bld: 160 mg/dL — ABNORMAL HIGH (ref 70–99)
Potassium: 4 mEq/L (ref 3.5–5.1)
Sodium: 134 mEq/L — ABNORMAL LOW (ref 135–145)
Total Bilirubin: 0.8 mg/dL (ref 0.2–1.2)
Total Protein: 6.9 g/dL (ref 6.0–8.3)

## 2019-06-29 NOTE — Patient Instructions (Signed)
Carbohydrate Counting for Diabetes Mellitus, Adult  Carbohydrate counting is a method of keeping track of how many carbohydrates you eat. Eating carbohydrates naturally increases the amount of sugar (glucose) in the blood. Counting how many carbohydrates you eat helps keep your blood glucose within normal limits, which helps you manage your diabetes (diabetes mellitus). It is important to know how many carbohydrates you can safely have in each meal. This is different for every person. A diet and nutrition specialist (registered dietitian) can help you make a meal plan and calculate how many carbohydrates you should have at each meal and snack. Carbohydrates are found in the following foods:  Grains, such as breads and cereals.  Dried beans and soy products.  Starchy vegetables, such as potatoes, peas, and corn.  Fruit and fruit juices.  Milk and yogurt.  Sweets and snack foods, such as cake, cookies, candy, chips, and soft drinks. How do I count carbohydrates? There are two ways to count carbohydrates in food. You can use either of the methods or a combination of both. Reading "Nutrition Facts" on packaged food The "Nutrition Facts" list is included on the labels of almost all packaged foods and beverages in the [place] It includes:  The serving size.  Information about nutrients in each serving, including the grams (g) of carbohydrate per serving. To use the "Nutrition Facts":  Decide how many servings you will have.  Multiply the number of servings by the number of carbohydrates per serving.  The resulting number is the total amount of carbohydrates that you will be having. Learning standard serving sizes of other foods When you eat carbohydrate foods that are not packaged or do not include "Nutrition Facts" on the label, you need to measure the servings in order to count the amount of carbohydrates:  Measure the foods that you will eat with a food scale or measuring cup, if  needed.  Decide how many standard-size servings you will eat.  Multiply the number of servings by 15. Most carbohydrate-rich foods have about 15 g of carbohydrates per serving. ? For example, if you eat 8 oz (170 g) of strawberries, you will have eaten 2 servings and 30 g of carbohydrates (2 servings x 15 g = 30 g).  For foods that have more than one food mixed, such as soups and casseroles, you must count the carbohydrates in each food that is included. The following list contains standard serving sizes of common carbohydrate-rich foods. Each of these servings has about 15 g of carbohydrates:   hamburger bun or  English muffin.   oz (15 mL) syrup.   oz (14 g) jelly.  1 slice of bread.  1 six-inch tortilla.  3 oz (85 g) cooked rice or pasta.  4 oz (113 g) cooked dried beans.  4 oz (113 g) starchy vegetable, such as peas, corn, or potatoes.  4 oz (113 g) hot cereal.  4 oz (113 g) mashed potatoes or  of a large baked potato.  4 oz (113 g) canned or frozen fruit.  4 oz (120 mL) fruit juice.  4-6 crackers.  6 chicken nuggets.  6 oz (170 g) unsweetened dry cereal.  6 oz (170 g) plain fat-free yogurt or yogurt sweetened with artificial sweeteners.  8 oz (240 mL) milk.  8 oz (170 g) fresh fruit or one small piece of fruit.  24 oz (680 g) popped popcorn. Example of carbohydrate counting Sample meal  3 oz (85 g) chicken breast.  6 oz (170 g)   brown rice.  4 oz (113 g) corn.  8 oz (240 mL) milk.  8 oz (170 g) strawberries with sugar-free whipped topping. Carbohydrate calculation 1. Identify the foods that contain carbohydrates: ? Rice. ? Corn. ? Milk. ? Strawberries. 2. Calculate how many servings you have of each food: ? 2 servings rice. ? 1 serving corn. ? 1 serving milk. ? 1 serving strawberries. 3. Multiply each number of servings by 15 g: ? 2 servings rice x 15 g = 30 g. ? 1 serving corn x 15 g = 15 g. ? 1 serving milk x 15 g = 15 g. ? 1  serving strawberries x 15 g = 15 g. 4. Add together all of the amounts to find the total grams of carbohydrates eaten: ? 30 g + 15 g + 15 g + 15 g = 75 g of carbohydrates total. Summary  Carbohydrate counting is a method of keeping track of how many carbohydrates you eat.  Eating carbohydrates naturally increases the amount of sugar (glucose) in the blood.  Counting how many carbohydrates you eat helps keep your blood glucose within normal limits, which helps you manage your diabetes.  A diet and nutrition specialist (registered dietitian) can help you make a meal plan and calculate how many carbohydrates you should have at each meal and snack. This information is not intended to replace advice given to you by your health care provider. Make sure you discuss any questions you have with your health care provider. Document Revised: 09/26/2016 Document Reviewed: 08/16/2015 Elsevier Patient Education  2020 [employer]  

## 2019-06-29 NOTE — Assessment & Plan Note (Signed)
Well controlled, no changes to meds. Encouraged heart healthy diet such as the DASH diet and exercise as tolerated.  °

## 2019-06-29 NOTE — Progress Notes (Signed)
Subjective:    Patient ID: Vincent Allen, male    DOB: Nov 28, 1933, 86 y.o.   MRN: [id number]  Chief Complaint  Patient presents with  . Diabetes    HPI Patient is in today for follow up on chronic medical concerns. He feels well today. No recent febrile illnees or hospitalizations. He is most concerned about blood sugars running higher since we stopped his Metfromin due to renal concerns. Sugars have run between 100 to 200. No polyuria or polydipsia. He has been trying to minimize simple carbohydrates and trying to stay active. Denies CP/palp/SOB/HA/congestion/fevers/GI or GU c/o. Taking meds as prescribed  Past Medical History:  Diagnosis Date  . Arthritis   . Bradycardia   . Cataract   . Chronic kidney disease stage III (GFR 30-59 ml/min) 05/03/2012  . Colon polyps   . Complication of anesthesia    emesis  . Diabetes mellitus   . GERD (gastroesophageal reflux disease)   . HOH (hard of hearing)   . HTN (hypertension) 01/13/2013  . Hyperlipidemia   . Hypertension   . Pacemaker 12/2016  . Persistent atrial fibrillation ([place])   . Presence of permanent cardiac pacemaker 09/20/2016  . Symptomatic bradycardia   . Vitamin D deficiency 07/17/2015    Past Surgical History:  Procedure Laterality Date  . ANKLE ARTHROPLASTY    . CARDIOVERSION N/A 08/20/2016   Procedure: CARDIOVERSION;  Surgeon: Sanda Klein, MD;  Location: [facility];  Service: Cardiovascular;  Laterality: N/A;  . CATARACT EXTRACTION  2010, 2014  . CATARACT EXTRACTION  02/28/14  . CHOLECYSTECTOMY    . CHOLECYSTECTOMY N/A 05/03/2012   Procedure: LAPAROSCOPIC CHOLECYSTECTOMY WITH INTRAOPERATIVE CHOLANGIOGRAM;  Surgeon: [name], MD;  Location: [facility];  Service: General;  Laterality: N/A;  . INSERT / REPLACE / REMOVE PACEMAKER  09/20/2016  . PACEMAKER IMPLANT N/A 09/20/2016   Procedure: Pacemaker Implant;  Surgeon: Deboraha Sprang, MD;  Location: [facility];  Service: Cardiovascular;  Laterality: N/A;    . ROTATOR CUFF REPAIR    . SHOULDER SURGERY    . TONSILLECTOMY      Family History  Problem Relation Age of Onset  . Cancer Mother        colon, breast, pancreas, skin cancer  . Heart disease Father        heart valve replaced  . Stroke Father   . Diabetes Father   . Cancer Paternal Grandmother        colon  . Obesity Son   . Heart disease Son        bradycardia    Social History   Socioeconomic History  . Marital status: Widowed    Spouse name: Not on file  . Number of children: Not on file  . Years of education: Not on file  . Highest education level: Not on file  Occupational History  . Not on file  Tobacco Use  . Smoking status: Never Smoker  . Smokeless tobacco: Never Used  Substance and Sexual Activity  . Alcohol use: Yes    Alcohol/week: 1.0 standard drinks    Types: 1 Glasses of wine per week  . Drug use: No  . 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.  Other Topics Concern  . Not on file  Social History Narrative  . Not on file   Social Determinants of Health   Financial Resource  Strain:   . Difficulty of Paying Living Expenses:   Food Insecurity:   . Worried About Charity fundraiser in the Last Year:   . Arboriculturist in the Last Year:   Transportation Needs:   . Film/video editor (Medical):   Marland Kitchen Lack of Transportation (Non-Medical):   Physical Activity:   . Days of Exercise per Week:   . Minutes of Exercise per Session:   Stress:   . Feeling of Stress :   Social Connections:   . Frequency of Communication with Friends and Family:   . Frequency of Social Gatherings with Friends and Family:   . Attends Religious Services:   . Active Member of Clubs or Organizations:   . Attends Archivist Meetings:   Marland Kitchen Marital Status:   Intimate Partner Violence:   . Fear of Current or Ex-Partner:   . Emotionally Abused:   Marland Kitchen  Physically Abused:   . Sexually Abused:     Outpatient Medications Prior to Visit  Medication Sig Dispense Refill  . amLODipine (NORVASC) 5 MG tablet TAKE 1 TABLET BY MOUTH EVERY DAY 90 tablet 1  . Calcium 600 MG tablet Take 1 tablet (600 mg total) by mouth 2 (two) times daily. 60 tablet 0  . ELIQUIS 2.5 MG TABS tablet TAKE 1 TABLET BY MOUTH TWICE A DAY 60 tablet 5  . furosemide (LASIX) 20 MG tablet TAKE 1 TABLET (20 MG TOTAL) BY MOUTH DAILY AS NEEDED FOR EDEMA (WEIGHT GAIN >3LBS IN 24 HRS, SOB). 30 tablet 2  . glipiZIDE (GLUCOTROL) 10 MG tablet TAKE 2 TABLETS BY MOUTH TWICE A DAY BEFORE MEALS 360 tablet 0  . glucose blood (ONE TOUCH ULTRA TEST) test strip USE 1 STRIP 2 TIMES DAILY TO CHECK BLOOD SUGAR DX E08.22, N18.3 300 each 1  . JANUVIA 100 MG tablet TAKE 1/2 TO 1 TABLET BY MOUTH DAILY 30 tablet 0  . Lancets (ONETOUCH ULTRASOFT) lancets Use as instructed to check blood sugar twice a day as needed Dx Code E11.9 100 each 6  . losartan (COZAAR) 25 MG tablet Take 1 tablet (25 mg total) by mouth daily. 1 tablet 0  . meclizine (ANTIVERT) 25 MG tablet Take 1 tablet (25 mg total) by mouth 3 (three) times daily as needed for dizziness. 30 tablet 0  . mupirocin ointment (BACTROBAN) 2 % APPLY TO AFFECTED AREA EVERY DAY    . neomycin-polymyxin-hydrocortisone (CORTISPORIN) OTIC solution Place 4 drops into both ears 3 (three) times daily. 10 mL 0  . triamterene-hydrochlorothiazide (MAXZIDE) 75-50 MG tablet TAKE 1 TABLET BY MOUTH EVERY DAY 90 tablet 1  . atorvastatin (LIPITOR) 20 MG tablet Take 1 tablet (20 mg total) by mouth daily. 90 tablet 3  . losartan (COZAAR) 50 MG tablet Take 1 tablet (50 mg total) by mouth daily. Please schedule annual appt with Dr. Debara Pickett for refills. [phone]. 1st attempt. 30 tablet 0  . metFORMIN (GLUCOPHAGE-XR) 500 MG 24 hr tablet TAKE 1 TABLET BY MOUTH EVERY DAY WITH BREAKFAST 90 tablet 1   No facility-administered medications prior to visit.    Allergies  Allergen  Reactions  . Penicillins Hives and Itching    Has patient had a PCN reaction causing immediate rash, facial/tongue/throat swelling, SOB or lightheadedness with hypotension:Yes Has patient had a PCN reaction causing severe rash involving mucus membranes or skin necrosis:No Has patient had a PCN reaction that required hospitalization:No Has patient had a PCN reaction occurring within the last 10 years:No If all  of the above answers are "NO", then may proceed with Cephalosporin use.   . Verapamil     Junctional rhythm  . Influenza Vaccines Rash    Review of Systems  Constitutional: Negative for fever and malaise/fatigue.  HENT: Negative for congestion.   Eyes: Negative for blurred vision.  Respiratory: Negative for shortness of breath.   Cardiovascular: Negative for chest pain, palpitations and leg swelling.  Gastrointestinal: Negative for abdominal pain, blood in stool and nausea.  Genitourinary: Negative for dysuria and frequency.  Musculoskeletal: Negative for falls.  Skin: Negative for rash.  Neurological: Negative for dizziness, loss of consciousness and headaches.  Endo/Heme/Allergies: Negative for environmental allergies.  Psychiatric/Behavioral: Negative for depression. The patient is not nervous/anxious.        Objective:    Physical Exam Vitals and nursing note reviewed.  Constitutional:      General: He is not in acute distress.    Appearance: He is well-developed.  HENT:     Head: Normocephalic and atraumatic.     Nose: Nose normal.  Eyes:     General:        Right eye: No discharge.        Left eye: No discharge.  Cardiovascular:     Rate and Rhythm: Normal rate and regular rhythm.     Heart sounds: No murmur.  Pulmonary:     Effort: Pulmonary effort is normal.     Breath sounds: Normal breath sounds.  Abdominal:     General: Bowel sounds are normal.     Palpations: Abdomen is soft.     Tenderness: There is no abdominal tenderness.  Musculoskeletal:      Cervical back: Normal range of motion and neck supple.  Skin:    General: Skin is warm and dry.  Neurological:     Mental Status: He is alert and oriented to person, place, and time.     BP 120/70 (BP Location: Left Arm, Cuff Size: Large)   Pulse 68   Temp 98.1 F (36.7 C) (Temporal)   Resp 12   Ht 6' (1.829 m)   Wt 226 lb (102.5 kg)   SpO2 97%   BMI 30.65 kg/m  Wt Readings from Last 3 Encounters:  06/29/19 226 lb (102.5 kg)  04/05/19 226 lb 12.8 oz (102.9 kg)  11/03/18 226 lb 9.6 oz (102.8 kg)    Diabetic Foot Exam - Simple   No data filed     Lab Results  Component Value Date   WBC 8.1 06/10/2019   HGB 15.2 06/10/2019   HCT 45.6 06/10/2019   PLT 196.0 06/10/2019   GLUCOSE 160 (H) 06/29/2019   CHOL 178 06/10/2019   TRIG 165.0 (H) 06/10/2019   HDL 39.70 06/10/2019   LDLDIRECT 65.0 11/03/2018   LDLCALC 106 (H) 06/10/2019   ALT 23 06/29/2019   AST 20 06/29/2019   NA 134 (L) 06/29/2019   K 4.0 06/29/2019   CL 101 06/29/2019   CREATININE 1.61 (H) 06/29/2019   BUN 36 (H) 06/29/2019   CO2 24 06/29/2019   TSH 2.60 02/15/2019   PSA 1.18 10/07/2012   INR 1.0 08/15/2016   HGBA1C 7.4 (H) 06/10/2019   MICROALBUR 26.7 (H) 07/15/2016    Lab Results  Component Value Date   TSH 2.60 02/15/2019   Lab Results  Component Value Date   WBC 8.1 06/10/2019   HGB 15.2 06/10/2019   HCT 45.6 06/10/2019   MCV 86.5 06/10/2019   PLT 196.0 06/10/2019  Lab Results  Component Value Date   NA 134 (L) 06/29/2019   K 4.0 06/29/2019   CO2 24 06/29/2019   GLUCOSE 160 (H) 06/29/2019   BUN 36 (H) 06/29/2019   CREATININE 1.61 (H) 06/29/2019   BILITOT 0.8 06/29/2019   ALKPHOS 47 06/29/2019   AST 20 06/29/2019   ALT 23 06/29/2019   PROT 6.9 06/29/2019   ALBUMIN 4.1 06/29/2019   CALCIUM 9.6 06/29/2019   ANIONGAP 8 07/10/2016   GFR 41.10 (L) 06/29/2019   Lab Results  Component Value Date   CHOL 178 06/10/2019   Lab Results  Component Value Date   HDL 39.70 06/10/2019    Lab Results  Component Value Date   LDLCALC 106 (H) 06/10/2019   Lab Results  Component Value Date   TRIG 165.0 (H) 06/10/2019   Lab Results  Component Value Date   CHOLHDL 4 06/10/2019   Lab Results  Component Value Date   HGBA1C 7.4 (H) 06/10/2019       Assessment & Plan:   Problem List Items Addressed This Visit    Diabetes mellitus with chronic kidney disease ([place]) - Primary (Chronic)    Patient last hgba1c much better but we have had to stop his metformin since then due to renal functions so his sugars have spiked up to between 150 to 200 fasting. May have to consider Trulicity vs Lantus. Minimize simple carbs and stay active. Check cmp today      Relevant Orders   Comprehensive metabolic panel (Completed)   Comprehensive metabolic panel   Chronic kidney disease stage III (GFR 30-59 ml/min) (Chronic)    Have stopped Metformin and lowered Losartan. Continue to hydrate well and check cmp today      Essential hypertension    Well controlled, no changes to meds. Encouraged heart healthy diet such as the DASH diet and exercise as tolerated.       Vitamin D deficiency    Supplement and monitor         I have discontinued Vincent C. Wyatt "Bob"'s metFORMIN. I am also having him maintain his calcium carbonate, meclizine, neomycin-polymyxin-hydrocortisone, furosemide, atorvastatin, glucose blood, onetouch ultrasoft, losartan, amLODipine, Eliquis, mupirocin ointment, triamterene-hydrochlorothiazide, glipiZIDE, and Januvia.  No orders of the defined types were placed in this encounter.    [name], MD

## 2019-06-29 NOTE — Assessment & Plan Note (Signed)
Patient last hgba1c much better but we have had to stop his metformin since then due to renal functions so his sugars have spiked up to between 150 to 200 fasting. May have to consider Trulicity vs Lantus. Minimize simple carbs and stay active. Check cmp today

## 2019-06-29 NOTE — Assessment & Plan Note (Signed)
Have stopped Metformin and lowered Losartan. Continue to hydrate well and check cmp today

## 2019-06-29 NOTE — Assessment & Plan Note (Signed)
Supplement and monitor 

## 2019-06-30 ENCOUNTER — Ambulatory Visit (INDEPENDENT_AMBULATORY_CARE_PROVIDER_SITE_OTHER): Payer: PPO | Admitting: *Deleted

## 2019-06-30 DIAGNOSIS — I5032 Chronic diastolic (congestive) heart failure: Secondary | ICD-10-CM | POA: Diagnosis not present

## 2019-06-30 LAB — CUP PACEART REMOTE DEVICE CHECK
Battery Remaining Longevity: 115 mo
Battery Voltage: 3 V
Brady Statistic AP VP Percent: 0 %
Brady Statistic AP VS Percent: 0 %
Brady Statistic AS VP Percent: 0 %
Brady Statistic AS VS Percent: 0 %
Brady Statistic RA Percent Paced: 0.19 %
Brady Statistic RV Percent Paced: 98.84 %
Date Time Interrogation Session: 20210414003853
Implantable Lead Implant Date: 20180706
Implantable Lead Implant Date: 20180706
Implantable Lead Location: 753859
Implantable Lead Location: 753860
Implantable Lead Model: 5076
Implantable Lead Model: 5076
Implantable Pulse Generator Implant Date: 20180706
Lead Channel Impedance Value: 323 Ohm
Lead Channel Impedance Value: 513 Ohm
Lead Channel Impedance Value: 532 Ohm
Lead Channel Impedance Value: 570 Ohm
Lead Channel Pacing Threshold Amplitude: 0.5 V
Lead Channel Pacing Threshold Pulse Width: 0.4 ms
Lead Channel Sensing Intrinsic Amplitude: 1.375 mV
Lead Channel Sensing Intrinsic Amplitude: 1.375 mV
Lead Channel Sensing Intrinsic Amplitude: 10.75 mV
Lead Channel Sensing Intrinsic Amplitude: 10.75 mV
Lead Channel Setting Pacing Amplitude: 2 V
Lead Channel Setting Pacing Amplitude: 2.5 V
Lead Channel Setting Pacing Pulse Width: 0.4 ms
Lead Channel Setting Sensing Sensitivity: 2.8 mV

## 2019-06-30 NOTE — Progress Notes (Signed)
PPM Remote  

## 2019-07-02 ENCOUNTER — Other Ambulatory Visit: Payer: Self-pay | Admitting: Family Medicine

## 2019-07-09 ENCOUNTER — Other Ambulatory Visit: Payer: Self-pay | Admitting: Internal Medicine

## 2019-07-14 ENCOUNTER — Other Ambulatory Visit: Payer: Self-pay | Admitting: Family Medicine

## 2019-07-14 DIAGNOSIS — I4819 Other persistent atrial fibrillation: Secondary | ICD-10-CM

## 2019-07-30 ENCOUNTER — Other Ambulatory Visit (INDEPENDENT_AMBULATORY_CARE_PROVIDER_SITE_OTHER): Payer: PPO

## 2019-07-30 ENCOUNTER — Other Ambulatory Visit: Payer: Self-pay

## 2019-07-30 DIAGNOSIS — N183 Chronic kidney disease, stage 3 unspecified: Secondary | ICD-10-CM | POA: Diagnosis not present

## 2019-07-30 DIAGNOSIS — E0822 Diabetes mellitus due to underlying condition with diabetic chronic kidney disease: Secondary | ICD-10-CM | POA: Diagnosis not present

## 2019-07-30 LAB — COMPREHENSIVE METABOLIC PANEL
ALT: 21 U/L (ref 0–53)
AST: 18 U/L (ref 0–37)
Albumin: 3.9 g/dL (ref 3.5–5.2)
Alkaline Phosphatase: 54 U/L (ref 39–117)
BUN: 50 mg/dL — ABNORMAL HIGH (ref 6–23)
CO2: 22 mEq/L (ref 19–32)
Calcium: 8.9 mg/dL (ref 8.4–10.5)
Chloride: 101 mEq/L (ref 96–112)
Creatinine, Ser: 1.85 mg/dL — ABNORMAL HIGH (ref 0.40–1.50)
GFR: 35 mL/min — ABNORMAL LOW (ref >60.00)
Glucose, Bld: 191 mg/dL — ABNORMAL HIGH (ref 70–99)
Potassium: 4.2 mEq/L (ref 3.5–5.1)
Sodium: 133 mEq/L — ABNORMAL LOW (ref 135–145)
Total Bilirubin: 0.7 mg/dL (ref 0.2–1.2)
Total Protein: 6.6 g/dL (ref 6.0–8.3)

## 2019-07-31 ENCOUNTER — Encounter: Payer: Self-pay | Admitting: Family Medicine

## 2019-08-02 ENCOUNTER — Other Ambulatory Visit: Payer: Self-pay | Admitting: *Deleted

## 2019-08-02 DIAGNOSIS — N289 Disorder of kidney and ureter, unspecified: Secondary | ICD-10-CM

## 2019-08-02 DIAGNOSIS — I10 Essential (primary) hypertension: Secondary | ICD-10-CM

## 2019-08-02 MED ORDER — METOPROLOL SUCCINATE ER 25 MG PO TB24: 25.0000 mg | ORAL_TABLET | Freq: Every day | ORAL | 1 refills | Status: DC

## 2019-08-16 ENCOUNTER — Other Ambulatory Visit: Payer: Self-pay | Admitting: Family Medicine

## 2019-08-24 ENCOUNTER — Ambulatory Visit: Payer: PPO

## 2019-08-24 ENCOUNTER — Other Ambulatory Visit: Payer: Self-pay

## 2019-08-24 ENCOUNTER — Other Ambulatory Visit (INDEPENDENT_AMBULATORY_CARE_PROVIDER_SITE_OTHER): Payer: PPO

## 2019-08-24 VITALS — BP 126/76 | HR 75

## 2019-08-24 DIAGNOSIS — I10 Essential (primary) hypertension: Secondary | ICD-10-CM

## 2019-08-24 DIAGNOSIS — N289 Disorder of kidney and ureter, unspecified: Secondary | ICD-10-CM

## 2019-08-24 LAB — COMPREHENSIVE METABOLIC PANEL
ALT: 23 U/L (ref 0–53)
AST: 19 U/L (ref 0–37)
Albumin: 4 g/dL (ref 3.5–5.2)
Alkaline Phosphatase: 46 U/L (ref 39–117)
BUN: 40 mg/dL — ABNORMAL HIGH (ref 6–23)
CO2: 25 mEq/L (ref 19–32)
Calcium: 9.5 mg/dL (ref 8.4–10.5)
Chloride: 100 mEq/L (ref 96–112)
Creatinine, Ser: 1.79 mg/dL — ABNORMAL HIGH (ref 0.40–1.50)
GFR: 36.35 mL/min — ABNORMAL LOW (ref >60.00)
Glucose, Bld: 150 mg/dL — ABNORMAL HIGH (ref 70–99)
Potassium: 3.9 mEq/L (ref 3.5–5.1)
Sodium: 133 mEq/L — ABNORMAL LOW (ref 135–145)
Total Bilirubin: 0.8 mg/dL (ref 0.2–1.2)
Total Protein: 6.7 g/dL (ref 6.0–8.3)

## 2019-08-24 NOTE — Progress Notes (Addendum)
BP Readings from Last 3 Encounters:  06/29/19 120/70  06/10/19 140/84  04/05/19 138/69   Patient here for blood pressure check after medication changed from Losartan to Metoprolol 25 mg.  BP today is 126 76 with a pulse 75. Per Dr. [name] patient was instructed to continue the same and to keep follow up appointment later this month.  Nursing note reviewed. Agree with documention and plan.

## 2019-08-25 ENCOUNTER — Other Ambulatory Visit: Payer: Self-pay | Admitting: Family Medicine

## 2019-09-09 ENCOUNTER — Ambulatory Visit: Payer: PPO | Admitting: Family Medicine

## 2019-09-12 ENCOUNTER — Other Ambulatory Visit: Payer: Self-pay | Admitting: Family Medicine

## 2019-09-14 ENCOUNTER — Ambulatory Visit (INDEPENDENT_AMBULATORY_CARE_PROVIDER_SITE_OTHER): Payer: PPO | Admitting: Family Medicine

## 2019-09-14 ENCOUNTER — Other Ambulatory Visit: Payer: Self-pay

## 2019-09-14 VITALS — BP 110/72 | HR 78 | Temp 97.8°F | Resp 12 | Ht 72.0 in | Wt 230.4 lb

## 2019-09-14 DIAGNOSIS — M25561 Pain in right knee: Secondary | ICD-10-CM | POA: Diagnosis not present

## 2019-09-14 DIAGNOSIS — E663 Overweight: Secondary | ICD-10-CM | POA: Insufficient documentation

## 2019-09-14 DIAGNOSIS — M25562 Pain in left knee: Secondary | ICD-10-CM | POA: Diagnosis not present

## 2019-09-14 DIAGNOSIS — R002 Palpitations: Secondary | ICD-10-CM | POA: Diagnosis not present

## 2019-09-14 DIAGNOSIS — I10 Essential (primary) hypertension: Secondary | ICD-10-CM

## 2019-09-14 DIAGNOSIS — N183 Chronic kidney disease, stage 3 unspecified: Secondary | ICD-10-CM

## 2019-09-14 DIAGNOSIS — E559 Vitamin D deficiency, unspecified: Secondary | ICD-10-CM | POA: Diagnosis not present

## 2019-09-14 DIAGNOSIS — E785 Hyperlipidemia, unspecified: Secondary | ICD-10-CM

## 2019-09-14 DIAGNOSIS — E0822 Diabetes mellitus due to underlying condition with diabetic chronic kidney disease: Secondary | ICD-10-CM

## 2019-09-14 DIAGNOSIS — K219 Gastro-esophageal reflux disease without esophagitis: Secondary | ICD-10-CM | POA: Diagnosis not present

## 2019-09-14 LAB — COMPREHENSIVE METABOLIC PANEL
ALT: 20 U/L (ref 0–53)
AST: 19 U/L (ref 0–37)
Albumin: 3.9 g/dL (ref 3.5–5.2)
Alkaline Phosphatase: 44 U/L (ref 39–117)
BUN: 28 mg/dL — ABNORMAL HIGH (ref 6–23)
CO2: 28 mEq/L (ref 19–32)
Calcium: 9.7 mg/dL (ref 8.4–10.5)
Chloride: 100 mEq/L (ref 96–112)
Creatinine, Ser: 1.65 mg/dL — ABNORMAL HIGH (ref 0.40–1.50)
GFR: 39.93 mL/min — ABNORMAL LOW (ref >60.00)
Glucose, Bld: 116 mg/dL — ABNORMAL HIGH (ref 70–99)
Potassium: 3.8 mEq/L (ref 3.5–5.1)
Sodium: 137 mEq/L (ref 135–145)
Total Bilirubin: 0.7 mg/dL (ref 0.2–1.2)
Total Protein: 6.5 g/dL (ref 6.0–8.3)

## 2019-09-14 LAB — LIPID PANEL
Cholesterol: 147 mg/dL (ref 0–200)
HDL: 34.3 mg/dL — ABNORMAL LOW (ref >39.00)
LDL Cholesterol: 79 mg/dL (ref 0–99)
NonHDL: 112.39
Total CHOL/HDL Ratio: 4
Triglycerides: 166 mg/dL — ABNORMAL HIGH (ref 0.0–149.0)
VLDL: 33.2 mg/dL (ref 0.0–40.0)

## 2019-09-14 LAB — CBC
HCT: 44.9 % (ref 39.0–52.0)
Hemoglobin: 14.6 g/dL (ref 13.0–17.0)
MCHC: 32.4 g/dL (ref 30.0–36.0)
MCV: 86.2 fl (ref 78.0–100.0)
Platelets: 154 10*3/uL (ref 150.0–400.0)
RBC: 5.21 Mil/uL (ref 4.22–5.81)
RDW: 13.6 % (ref 11.5–15.5)
WBC: 8.3 10*3/uL (ref 4.0–10.5)

## 2019-09-14 LAB — TSH: TSH: 3.39 u[IU]/mL (ref 0.35–4.50)

## 2019-09-14 LAB — HEMOGLOBIN A1C: Hgb A1c MFr Bld: 7.7 % — ABNORMAL HIGH (ref 4.6–6.5)

## 2019-09-14 LAB — VITAMIN D 25 HYDROXY (VIT D DEFICIENCY, FRACTURES): VITD: 60.29 ng/mL (ref 30.00–100.00)

## 2019-09-14 NOTE — Assessment & Plan Note (Addendum)
Encouraged DASH diet, decrease po intake and increase exercise as tolerated. Needs 7-8 hours of sleep nightly. Avoid trans fats, eat small, frequent meals every 4-5 hours with lean proteins, complex carbs and healthy fats. Minimize simple carbs 

## 2019-09-14 NOTE — Assessment & Plan Note (Signed)
Well controlled, no changes to meds. Encouraged heart healthy diet such as the DASH diet and exercise as tolerated.  °

## 2019-09-14 NOTE — Assessment & Plan Note (Signed)
hgba1c acceptable, minimize simple carbs. Increase exercise as tolerated. Continue current meds 

## 2019-09-14 NOTE — Assessment & Plan Note (Signed)
Hydrate and monitor 

## 2019-09-14 NOTE — Assessment & Plan Note (Signed)
Supplement and monitor 

## 2019-09-14 NOTE — Patient Instructions (Signed)

## 2019-09-14 NOTE — Assessment & Plan Note (Signed)
Tolerating statin, encouraged heart healthy diet, avoid trans fats, minimize simple carbs and saturated fats. Increase exercise as tolerated 

## 2019-09-15 ENCOUNTER — Telehealth: Payer: Self-pay | Admitting: Internal Medicine

## 2019-09-15 ENCOUNTER — Telehealth: Payer: Self-pay | Admitting: Family Medicine

## 2019-09-15 NOTE — Telephone Encounter (Signed)
Called and spoke to the patient regarding his symptoms. He states that he saw his PCP yesterday and she suggested that he follow up with cardiology. The patient does not feel that he needs to be seen sooner than 7/6 which is his current appointment date with JC, NP. Pts recent BP was 110/72. He denies any SOB, CP, swelling or diaphoresis. He states that he feels well overall but can tell with his rapid heart beat that he is in Afib, pt is currently on Eliquis. Pt aware to call back with any new concerns or go to the ED if cir tical symptoms present. He has verbalized understanding and will continue to monitor his BP and HR.

## 2019-09-15 NOTE — Telephone Encounter (Signed)
Patient c/o Palpitations:  High priority if patient c/o lightheadedness, shortness of breath, or chest pain  1) How long have you had palpitations/irregular HR/ Afib? Are you having the symptoms now? Heart beating real fast- had 3 episodes of this  2) Are you currently experiencing lightheadedness, SOB or CP? No   3) Do you have a history of afib (atrial fibrillation) or irregular heart rhythm?   4) Have you checked your BP or HR? (document readings if available): blood pressure is good  5) Are you experiencing any other symptoms?no- pt wanted to be seen- made an appt for 09-21-19- with Coletta Memos

## 2019-09-15 NOTE — Progress Notes (Signed)
  Chronic Care Management   Note  09/15/2019 Name: Vincent Allen MRN: [id number] DOB: 07-24-1933  Vincent Allen is a 86 y.o. year old male who is a primary care patient of [name], MD. I reached out to [name] by phone today in response to a referral sent by [name]'s PCP, [name], MD.   [name] was given information about Chronic Care Management services today including:  1. CCM service includes personalized support from designated clinical staff supervised by his physician, including individualized plan of care and coordination with other care providers 2. 24/7 contact phone numbers for assistance for urgent and routine care needs. 3. Service will only be billed when office clinical staff spend 20 minutes or more in a month to coordinate care. 4. Only one practitioner may furnish and bill the service in a calendar month. 5. The patient may stop CCM services at any time (effective at the end of the month) by phone call to the office staff.   Patient agreed to services and verbal consent obtained.   Follow up plan:  [place]

## 2019-09-20 DIAGNOSIS — M25562 Pain in left knee: Secondary | ICD-10-CM | POA: Insufficient documentation

## 2019-09-20 NOTE — Progress Notes (Signed)
Subjective:    Patient ID: Vincent Allen, male    DOB: 27-Apr-1933, 86 y.o.   MRN: [id number]  Chief Complaint  Patient presents with  . Follow-up    HPI Patient is in today for evaluation of chronic medical concerns. No recent febrile illness or hospitalizations. He is doing well. No complaints of polyuria or polydipsia. Denies CP/palp/SOB/HA/congestion/fevers/GI or GU c/o. Taking meds as prescribed  Past Medical History:  Diagnosis Date  . Arthritis   . Bradycardia   . Cataract   . Chronic kidney disease stage III (GFR 30-59 ml/min) 05/03/2012  . Colon polyps   . Complication of anesthesia    emesis  . Diabetes mellitus   . GERD (gastroesophageal reflux disease)   . HOH (hard of hearing)   . HTN (hypertension) 01/13/2013  . Hyperlipidemia   . Hypertension   . Pacemaker 12/2016  . Persistent atrial fibrillation ([place])   . Presence of permanent cardiac pacemaker 09/20/2016  . Symptomatic bradycardia   . Vitamin D deficiency 07/17/2015    Past Surgical History:  Procedure Laterality Date  . ANKLE ARTHROPLASTY    . CARDIOVERSION N/A 08/20/2016   Procedure: CARDIOVERSION;  Surgeon: Sanda Klein, MD;  Location: [facility];  Service: Cardiovascular;  Laterality: N/A;  . CATARACT EXTRACTION  2010, 2014  . CATARACT EXTRACTION  02/28/14  . CHOLECYSTECTOMY    . CHOLECYSTECTOMY N/A 05/03/2012   Procedure: LAPAROSCOPIC CHOLECYSTECTOMY WITH INTRAOPERATIVE CHOLANGIOGRAM;  Surgeon: [name], MD;  Location: [facility];  Service: General;  Laterality: N/A;  . INSERT / REPLACE / REMOVE PACEMAKER  09/20/2016  . PACEMAKER IMPLANT N/A 09/20/2016   Procedure: Pacemaker Implant;  Surgeon: Deboraha Sprang, MD;  Location: [facility];  Service: Cardiovascular;  Laterality: N/A;  . ROTATOR CUFF REPAIR    . SHOULDER SURGERY    . TONSILLECTOMY      Family History  Problem Relation Age of Onset  . Cancer Mother        colon, breast, pancreas, skin cancer  . Heart disease Father          heart valve replaced  . Stroke Father   . Diabetes Father   . Cancer Paternal Grandmother        colon  . Obesity Son   . Heart disease Son        bradycardia    Social History   Socioeconomic History  . Marital status: Widowed    Spouse name: Not on file  . Number of children: Not on file  . Years of education: Not on file  . Highest education level: Not on file  Occupational History  . Not on file  Tobacco Use  . Smoking status: Never Smoker  . Smokeless tobacco: Never Used  Vaping Use  . Vaping Use: Never used  Substance and Sexual Activity  . Alcohol use: Yes    Alcohol/week: 1.0 standard drink    Types: 1 Glasses of wine per week  . Drug use: No  . 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.  Other Topics Concern  . Not on file  Social History Narrative  . Not on file   Social Determinants of Health   Financial Resource Strain:   . Difficulty of Paying Living Expenses:   Food Insecurity:   . Worried About Charity fundraiser in the Last Year:   .  Ran Out of Food in the Last Year:   Transportation Needs:   . Film/video editor (Medical):   Marland Kitchen Lack of Transportation (Non-Medical):   Physical Activity:   . Days of Exercise per Week:   . Minutes of Exercise per Session:   Stress:   . Feeling of Stress :   Social Connections:   . Frequency of Communication with Friends and Family:   . Frequency of Social Gatherings with Friends and Family:   . Attends Religious Services:   . Active Member of Clubs or Organizations:   . Attends Archivist Meetings:   Marland Kitchen Marital Status:   Intimate Partner Violence:   . Fear of Current or Ex-Partner:   . Emotionally Abused:   Marland Kitchen Physically Abused:   . Sexually Abused:     Outpatient Medications Prior to Visit  Medication Sig Dispense Refill  . amLODipine (NORVASC) 5 MG tablet TAKE 1 TABLET BY MOUTH  EVERY DAY 90 tablet 1  . Calcium 600 MG tablet Take 1 tablet (600 mg total) by mouth 2 (two) times daily. 60 tablet 0  . ELIQUIS 2.5 MG TABS tablet TAKE 1 TABLET BY MOUTH TWICE A DAY 60 tablet 5  . furosemide (LASIX) 20 MG tablet TAKE 1 TABLET (20 MG TOTAL) BY MOUTH DAILY AS NEEDED FOR EDEMA (WEIGHT GAIN >3LBS IN 24 HRS, SOB). 30 tablet 2  . glipiZIDE (GLUCOTROL) 10 MG tablet TAKE 2 TABLETS BY MOUTH TWICE A DAY BEFORE MEALS 360 tablet 0  . glucose blood (ONE TOUCH ULTRA TEST) test strip USE 1 STRIP 2 TIMES DAILY TO CHECK BLOOD SUGAR DX E08.22, N18.3 300 each 1  . JANUVIA 100 MG tablet TAKE 1/2 TO 1 TABLET BY MOUTH DAILY 30 tablet 5  . Lancets (ONETOUCH ULTRASOFT) lancets Use as instructed to check blood sugar twice a day as needed Dx Code E11.9 100 each 6  . meclizine (ANTIVERT) 25 MG tablet Take 1 tablet (25 mg total) by mouth 3 (three) times daily as needed for dizziness. 30 tablet 0  . metoprolol succinate (TOPROL-XL) 25 MG 24 hr tablet TAKE 1 TABLET BY MOUTH EVERY DAY 90 tablet 0  . mupirocin ointment (BACTROBAN) 2 % APPLY TO AFFECTED AREA EVERY DAY    . neomycin-polymyxin-hydrocortisone (CORTISPORIN) OTIC solution Place 4 drops into both ears 3 (three) times daily. 10 mL 0  . triamterene-hydrochlorothiazide (MAXZIDE) 75-50 MG tablet TAKE 1 TABLET BY MOUTH EVERY DAY 90 tablet 1  . atorvastatin (LIPITOR) 20 MG tablet Take 1 tablet (20 mg total) by mouth daily. 90 tablet 3   No facility-administered medications prior to visit.    Allergies  Allergen Reactions  . Penicillins Hives and Itching    Has patient had a PCN reaction causing immediate rash, facial/tongue/throat swelling, SOB or lightheadedness with hypotension:Yes Has patient had a PCN reaction causing severe rash involving mucus membranes or skin necrosis:No Has patient had a PCN reaction that required hospitalization:No Has patient had a PCN reaction occurring within the last 10 years:No If all of the above answers are "NO", then  may proceed with Cephalosporin use.   . Verapamil     Junctional rhythm  . Influenza Vaccines Rash    Review of Systems  Constitutional: Negative for fever and malaise/fatigue.  HENT: Negative for congestion.   Eyes: Negative for blurred vision.  Respiratory: Negative for shortness of breath.   Cardiovascular: Negative for chest pain, palpitations and leg swelling.  Gastrointestinal: Negative for abdominal pain, blood in stool and  nausea.  Genitourinary: Negative for dysuria and frequency.  Musculoskeletal: Positive for joint pain. Negative for falls.  Skin: Negative for rash.  Neurological: Negative for dizziness, loss of consciousness and headaches.  Endo/Heme/Allergies: Negative for environmental allergies.  Psychiatric/Behavioral: Negative for depression. The patient is not nervous/anxious.        Objective:    Physical Exam Vitals and nursing note reviewed.  Constitutional:      General: He is not in acute distress.    Appearance: He is well-developed.  HENT:     Head: Normocephalic and atraumatic.     Nose: Nose normal.  Eyes:     General:        Right eye: No discharge.        Left eye: No discharge.  Cardiovascular:     Rate and Rhythm: Normal rate and regular rhythm.     Heart sounds: No murmur heard.   Pulmonary:     Effort: Pulmonary effort is normal.     Breath sounds: Normal breath sounds.  Abdominal:     General: Bowel sounds are normal.     Palpations: Abdomen is soft.     Tenderness: There is no abdominal tenderness.  Musculoskeletal:     Cervical back: Normal range of motion and neck supple.  Skin:    General: Skin is warm and dry.  Neurological:     Mental Status: He is alert and oriented to person, place, and time.     BP 110/72 (BP Location: Left Arm, Cuff Size: Large)   Pulse 78   Temp 97.8 F (36.6 C) (Temporal)   Resp 12   Ht 6' (1.829 m)   Wt 230 lb 6.4 oz (104.5 kg)   SpO2 98%   BMI 31.25 kg/m  Wt Readings from Last 3  Encounters:  09/14/19 230 lb 6.4 oz (104.5 kg)  06/29/19 226 lb (102.5 kg)  04/05/19 226 lb 12.8 oz (102.9 kg)    Diabetic Foot Exam - Simple   No data filed     Lab Results  Component Value Date   WBC 8.3 09/14/2019   HGB 14.6 09/14/2019   HCT 44.9 09/14/2019   PLT 154.0 09/14/2019   GLUCOSE 116 (H) 09/14/2019   CHOL 147 09/14/2019   TRIG 166.0 (H) 09/14/2019   HDL 34.30 (L) 09/14/2019   LDLDIRECT 65.0 11/03/2018   LDLCALC 79 09/14/2019   ALT 20 09/14/2019   AST 19 09/14/2019   NA 137 09/14/2019   K 3.8 09/14/2019   CL 100 09/14/2019   CREATININE 1.65 (H) 09/14/2019   BUN 28 (H) 09/14/2019   CO2 28 09/14/2019   TSH 3.39 09/14/2019   PSA 1.18 10/07/2012   INR 1.0 08/15/2016   HGBA1C 7.7 (H) 09/14/2019   MICROALBUR 26.7 (H) 07/15/2016    Lab Results  Component Value Date   TSH 3.39 09/14/2019   Lab Results  Component Value Date   WBC 8.3 09/14/2019   HGB 14.6 09/14/2019   HCT 44.9 09/14/2019   MCV 86.2 09/14/2019   PLT 154.0 09/14/2019   Lab Results  Component Value Date   NA 137 09/14/2019   K 3.8 09/14/2019   CO2 28 09/14/2019   GLUCOSE 116 (H) 09/14/2019   BUN 28 (H) 09/14/2019   CREATININE 1.65 (H) 09/14/2019   BILITOT 0.7 09/14/2019   ALKPHOS 44 09/14/2019   AST 19 09/14/2019   ALT 20 09/14/2019   PROT 6.5 09/14/2019   ALBUMIN 3.9 09/14/2019   CALCIUM 9.7  09/14/2019   ANIONGAP 8 07/10/2016   GFR 39.93 (L) 09/14/2019   Lab Results  Component Value Date   CHOL 147 09/14/2019   Lab Results  Component Value Date   HDL 34.30 (L) 09/14/2019   Lab Results  Component Value Date   LDLCALC 79 09/14/2019   Lab Results  Component Value Date   TRIG 166.0 (H) 09/14/2019   Lab Results  Component Value Date   CHOLHDL 4 09/14/2019   Lab Results  Component Value Date   HGBA1C 7.7 (H) 09/14/2019       Assessment & Plan:   Problem List Items Addressed This Visit    Diabetes mellitus with chronic kidney disease ([place]) (Chronic)    hgba1c  acceptable, minimize simple carbs. Increase exercise as tolerated. Continue current meds      Relevant Orders   Hemoglobin A1c (Completed)   Hyperlipidemia LDL goal <70 (Chronic)    Tolerating statin, encouraged heart healthy diet, avoid trans fats, minimize simple carbs and saturated fats. Increase exercise as tolerated      Relevant Orders   Lipid panel (Completed)   GERD (gastroesophageal reflux disease) (Chronic)    Avoid offending foods, start probiotics. Do not eat large meals in late evening and consider raising head of bed.       Chronic kidney disease stage III (GFR 30-59 ml/min) (Chronic)    Hydrate and monitor      Essential hypertension    Well controlled, no changes to meds. Encouraged heart healthy diet such as the DASH diet and exercise as tolerated.       Relevant Orders   CBC (Completed)   Comprehensive metabolic panel (Completed)   TSH (Completed)   Vitamin D deficiency    Supplement and monitor      Relevant Orders   VITAMIN D 25 Hydroxy (Vit-D Deficiency, Fractures) (Completed)   Morbid obesity ([place])    Encouraged DASH diet, decrease po intake and increase exercise as tolerated. Needs 7-8 hours of sleep nightly. Avoid trans fats, eat small, frequent meals every 4-5 hours with lean proteins, complex carbs and healthy fats. Minimize simple carbs      Knee pain, bilateral    Encouraged moist heat and gentle stretching as tolerated. May try NSAIDs and prescription meds as directed and report if symptoms worsen or seek immediate care. He declines referral to ortho for now but will let us know if pain worsens and he is ready for referral he will let us know.        Other Visit Diagnoses    Palpitations    -  Primary   Relevant Orders   Ambulatory referral to Cardiology      I am having [place] [employer]" maintain his calcium carbonate, meclizine, neomycin-polymyxin-hydrocortisone, furosemide, atorvastatin, glucose blood, onetouch ultrasoft,  mupirocin ointment, triamterene-hydrochlorothiazide, amLODipine, Eliquis, Januvia, metoprolol succinate, and glipiZIDE.  No orders of the defined types were placed in this encounter.    [name], MD

## 2019-09-20 NOTE — Assessment & Plan Note (Signed)
Encouraged moist heat and gentle stretching as tolerated. May try NSAIDs and prescription meds as directed and report if symptoms worsen or seek immediate care. He declines referral to ortho for now but will let us know if pain worsens and he is ready for referral he will let us know.

## 2019-09-20 NOTE — Progress Notes (Signed)
Cardiology Clinic Note   Patient Name: Vincent Allen Date of Encounter: 09/21/2019  Primary Care Provider:  [name], MD Primary Cardiologist:  Pixie Casino, MD  Patient Profile    Vincent Allen. [name] 86 year old male presents today for an evaluation of his fast heart rate.  Past Medical History    Past Medical History:  Diagnosis Date   Arthritis    Bradycardia    Cataract    Chronic kidney disease stage III (GFR 30-59 ml/min) 05/03/2012   Colon polyps    Complication of anesthesia    emesis   Diabetes mellitus    GERD (gastroesophageal reflux disease)    HOH (hard of hearing)    HTN (hypertension) 01/13/2013   Hyperlipidemia    Hypertension    Pacemaker 12/2016   Persistent atrial fibrillation ([place])    Presence of permanent cardiac pacemaker 09/20/2016   Symptomatic bradycardia    Vitamin D deficiency 07/17/2015   Past Surgical History:  Procedure Laterality Date   ANKLE ARTHROPLASTY     CARDIOVERSION N/A 08/20/2016   Procedure: CARDIOVERSION;  Surgeon: Sanda Klein, MD;  Location: [facility];  Service: Cardiovascular;  Laterality: N/A;   CATARACT EXTRACTION  2010, 2014   CATARACT EXTRACTION  02/28/14   CHOLECYSTECTOMY     CHOLECYSTECTOMY N/A 05/03/2012   Procedure: LAPAROSCOPIC CHOLECYSTECTOMY WITH INTRAOPERATIVE CHOLANGIOGRAM;  Surgeon: [name], MD;  Location: [facility];  Service: General;  Laterality: N/A;   INSERT / REPLACE / REMOVE PACEMAKER  09/20/2016   PACEMAKER IMPLANT N/A 09/20/2016   Procedure: Pacemaker Implant;  Surgeon: Deboraha Sprang, MD;  Location: [facility];  Service: Cardiovascular;  Laterality: N/A;   ROTATOR CUFF REPAIR     SHOULDER SURGERY     TONSILLECTOMY      Allergies  Allergies  Allergen Reactions   Penicillins Hives and Itching    Has patient had a PCN reaction causing immediate rash, facial/tongue/throat swelling, SOB or lightheadedness with hypotension:Yes Has patient had a  PCN reaction causing severe rash involving mucus membranes or skin necrosis:No Has patient had a PCN reaction that required hospitalization:No Has patient had a PCN reaction occurring within the last 10 years:No If all of the above answers are "NO", then may proceed with Cephalosporin use.    Verapamil     Junctional rhythm   Influenza Vaccines Rash    History of Present Illness    [name] has a PMH of hypertensive heart disease, CAD, essential hypertension, persistent atrial fibrillation, chronic diastolic CHF, GERD, chronic CKD stage III, hyperlipidemia, status post PPM, and morbid obesity.  He is previously seen by Dr. Wynonia Lawman for his paroxysmal atrial fibrillation. chads vas score of 4.  He was started on Eliquis 2.5 mg twice daily due to his creatinine and age.  An echocardiogram at that time showed normal LV function, diastolic dysfunction, moderate left atrial enlargement.  Outpatient monitoring was arranged to evaluate his persistent atrial fibrillation/flutter with controlled ventricular response to assess for presence of pauses.  He reported being fatigued at that time.  He reported that he had been somewhat fatigued for over 60 years.  He was interested in getting back to flying and felt that his lack of energy was keeping him from this.  He was last seen by Dr. Debara Pickett on 05/11/2018.  During that time he was doing well.  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  He was worried that he may follow-up.  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  He denied chest pain, shortness of breath, syncope.  His pacemaker interrogation at that time showed normal pacing.  He was ventricular paced at that time.  He was noted to have persistent/permanent atrial fibrillation.  He denied bleeding issues with Eliquis.  His blood pressure was well controlled.  His LDL at that time was 100.  His goal is slightly lower than this however  aggressive intervention was not chosen.  He was not sure if he was taking atorvastatin 20 and it was recommended that if he is not taking it he consider resuming the medicine.  He was seen by his PCP on 09/14/2019.  During that time he described fast heart rates, indicating that he had had 3 episodes.  He denied shortness of breath, chest pain, lower extremity edema, and diaphoresis .  His blood pressure was well controlled at 110/72.  He presents to the clinic today for follow-up evaluation and states he has had 3 episodes of accelerated heart rate.  He notes that the fast heart rate lasts for less than a minute and then goes back to normal.  He states the last incident where he noticed a rapid heart rate was around 1 month ago.  His increased heart rate woke him from his sleep.  He states that by the time he sat up and walked to the bathroom his heart rate had returned to normal.  His EKG today shows occasional premature ventricular complexes and fusion complexes.  He states that he has also recently been put on metoprolol succinate and taken off losartan.  Given his infrequent nature of palpitations we will continue to monitor at this time.  Lab work from 09/14/2019 unremarkable.  He states he does not add extra salt to his food.  However he does not search for low-sodium options.  I will give him the salty 6 diet sheet, have him increase his physical activity as tolerated, and order a monitor in the future if episodes recur.  Today he denies chest pain, shortness of breath, lower extremity edema, fatigue, palpitations, melena, hematuria, hemoptysis, diaphoresis, weakness, presyncope, syncope, orthopnea, and PND.   Home Medications    Prior to Admission medications   Medication Sig Start Date End Date Taking? Authorizing Provider  amLODipine (NORVASC) 5 MG tablet TAKE 1 TABLET BY MOUTH EVERY DAY 07/02/19   [name], MD  atorvastatin (LIPITOR) 20 MG tablet Take 1 tablet (20 mg total) by mouth  daily. 05/11/18 08/09/18  Pixie Casino, MD  Calcium 600 MG tablet Take 1 tablet (600 mg total) by mouth 2 (two) times daily. 01/30/11   Burnice Logan, MD  ELIQUIS 2.5 MG TABS tablet TAKE 1 TABLET BY MOUTH TWICE A DAY 07/14/19   [name], MD  furosemide (LASIX) 20 MG tablet TAKE 1 TABLET (20 MG TOTAL) BY MOUTH DAILY AS NEEDED FOR EDEMA (WEIGHT GAIN >3LBS IN 24 HRS, SOB). 03/04/18   [name], MD  glipiZIDE (GLUCOTROL) 10 MG tablet TAKE 2 TABLETS BY MOUTH TWICE A DAY BEFORE MEALS 09/13/19   [name], MD  glucose blood (ONE TOUCH ULTRA TEST) test strip USE 1 STRIP 2 TIMES DAILY TO CHECK BLOOD SUGAR DX E08.22, N18.3 11/03/18   [name], MD  JANUVIA 100 MG tablet TAKE 1/2 TO 1 TABLET BY MOUTH DAILY 08/17/19   [name], MD  Lancets [facility] ULTRASOFT) lancets Use as instructed  to check blood sugar twice a day as needed Dx Code E11.9 11/03/18   [name], MD  meclizine (ANTIVERT) 25 MG tablet Take 1 tablet (25 mg total) by mouth 3 (three) times daily as needed for dizziness. 11/11/17   [name], MD  metoprolol succinate (TOPROL-XL) 25 MG 24 hr tablet TAKE 1 TABLET BY MOUTH EVERY DAY 08/25/19   [name], MD  mupirocin ointment (BACTROBAN) 2 % APPLY TO AFFECTED AREA EVERY DAY 10/06/18   [provider]  neomycin-polymyxin-hydrocortisone (CORTISPORIN) OTIC solution Place 4 drops into both ears 3 (three) times daily. 11/13/17   [name], MD  triamterene-hydrochlorothiazide (MAXZIDE) 75-50 MG tablet TAKE 1 TABLET BY MOUTH EVERY DAY 05/28/19   [name], MD    Family History    Family History  Problem Relation Age of Onset   Cancer Mother        colon, breast, pancreas, skin cancer   Heart disease Father        heart valve replaced   Stroke Father    Diabetes Father    Cancer Paternal Grandmother        colon   Obesity Son    Heart disease Son        bradycardia   He indicated that his mother is deceased. He indicated that  his father is deceased. He indicated that his maternal grandmother is deceased. He indicated that his maternal grandfather is deceased. He indicated that his paternal grandmother is deceased. He indicated that his paternal grandfather is deceased. He indicated that his daughter is alive. He indicated that only one of his two sons is alive.  Social History    Social History   Socioeconomic History   Marital status: Widowed    Spouse name: Not on file   Number of children: Not on file   Years of education: Not on file   Highest education level: Not on file  Occupational History   Not on file  Tobacco Use   Smoking status: Never Smoker   Smokeless tobacco: Never Used  Vaping Use   Vaping Use: Never used  Substance and Sexual Activity   Alcohol use: Yes    Alcohol/week: 1.0 standard drink    Types: 1 Glasses of wine per week   Drug use: No   Sexual activity: Not on file    Comment: lives alone , no major dietary restrictions, 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.  Other Topics Concern   Not on file  Social History Narrative   Not on file   Social Determinants of Health   Financial Resource Strain:    Difficulty of Paying Living Expenses:   Food Insecurity:    Worried About Charity fundraiser in the Last Year:    Arboriculturist in the Last Year:   Transportation Needs:    Film/video editor (Medical):    Lack of Transportation (Non-Medical):   Physical Activity:    Days of Exercise per Week:    Minutes of Exercise per Session:   Stress:    Feeling of Stress :   Social Connections:    Frequency of Communication with Friends and Family:    Frequency of Social Gatherings with Friends and Family:    Attends Religious Services:    Active Member of Clubs or Organizations:    Attends Archivist Meetings:    Marital Status:   Intimate Partner Violence:  Fear of Current or Ex-Partner:    Emotionally Abused:     Physically Abused:    Sexually Abused:      Review of Systems    General:  No chills, fever, night sweats or weight changes.  Cardiovascular:  No chest pain, dyspnea on exertion, edema, orthopnea, palpitations, paroxysmal nocturnal dyspnea. Dermatological: No rash, lesions/masses Respiratory: No cough, dyspnea Urologic: No hematuria, dysuria Abdominal:   No nausea, vomiting, diarrhea, bright red blood per rectum, melena, or hematemesis Neurologic:  No visual changes, wkns, changes in mental status. All other systems reviewed and are otherwise negative except as noted above.  Physical Exam    VS:  BP 126/72 (BP Location: Left Arm, Patient Position: Sitting, Cuff Size: Large)    Pulse 64    Ht 6' (1.829 m)    Wt 232 lb (105.2 kg)    BMI 31.46 kg/m  , BMI Body mass index is 31.46 kg/m. GEN: Well nourished, well developed, in no acute distress. HEENT: normal. Neck: Supple, no JVD, carotid bruits, or masses. Cardiac: RRR, no murmurs, rubs, or gallops. No clubbing, cyanosis, +1 bilateral pitting lower extremity edema.  Radials/DP/PT 2+ and equal bilaterally.  Respiratory:  Respirations regular and unlabored, clear to auscultation bilaterally. GI: Soft, nontender, nondistended, BS + x 4. MS: no deformity or atrophy. Skin: warm and dry, no rash. Neuro:  Strength and sensation are intact. Psych: Normal affect.  Accessory Clinical Findings    ECG personally reviewed by me today-demand pacemaker, sinus rhythm with A-V dissociation and wide complex QRS axis with occasional PVCs and fusion complexes left axis deviation nonspecific intraventricular block lateral infarct undetermined age, inferior infarct undetermined age 81 bpm  EKG 05/11/2018 V-paced 75 bpm  Echocardiogram 07/10/2016 Study Conclusions   - Left ventricle: The cavity size was normal. Wall thickness was  normal. The estimated ejection fraction was 55%. Indeterminant  diastolic function (atrial fibrillation).  Although no diagnostic  regional wall motion abnormality was identified, this possibility  cannot be completely excluded on the basis of this study.  - Aortic valve: There was no stenosis.  - Mitral valve: Mildly calcified annulus. Mildly calcified leaflets  . There was mild regurgitation.  - Left atrium: The atrium was moderately dilated.  - Right ventricle: The cavity size was mildly dilated. Systolic  function was normal.  - Tricuspid valve: Peak RV-RA gradient (S): 46 mm Hg.  - Pulmonary arteries: PA peak pressure: 54 mm Hg (S).  - Systemic veins: IVC measured 2.1 cm with > 50% respirophasic  variation, suggesting RA pressure 8 mmHg.   Assessment & Plan   1.  Persistent atrial fibrillation/LBBB-EKG today shows sinus rhythm with A-V dissociation. Status post PPM 7/18.  CHA2DS2-VASc score 4.  Noticed 3 episodes of accelerated heart rate.  Last episode of increased heart rate was more than 1 month ago.  Labs from 09/14/2019 unremarkable. Continue Eliquis, metoprolol Continue to monitor. Followed by EP  Diastolic CHF-euvolemic today.  No increased DOE. Continue metoprolol, Maxide, furosemide Heart healthy low-sodium diet-salty 6 given Increase physical activity as tolerated Order BMP  Essential hypertension-BP today 126/72.  Well-controlled at home Continue amlodipine, metoprolol, Maxide Heart healthy low-sodium diet-salty 6 given Increase physical activity as tolerated  Hyperlipidemia-LDL 79 on 09/14/2019 Continue atorvastatin Heart healthy low-sodium high-fiber diet Increase physical activity as tolerated  Disposition: Follow-up with Dr. Debara Pickett in 6 months.  Jossie Ng. Rober Skeels NP-C    09/21/2019, 3:44 PM [facility] [place] [address] Office [phone] Fax [phone])  [phone] ° °

## 2019-09-20 NOTE — Assessment & Plan Note (Signed)
Avoid offending foods, start probiotics. Do not eat large meals in late evening and consider raising head of bed.  

## 2019-09-21 ENCOUNTER — Other Ambulatory Visit: Payer: Self-pay

## 2019-09-21 ENCOUNTER — Ambulatory Visit (INDEPENDENT_AMBULATORY_CARE_PROVIDER_SITE_OTHER): Payer: PPO | Admitting: General Practice

## 2019-09-21 ENCOUNTER — Encounter: Payer: Self-pay | Admitting: General Practice

## 2019-09-21 VITALS — BP 126/72 | HR 64 | Ht 72.0 in | Wt 232.0 lb

## 2019-09-21 DIAGNOSIS — I5032 Chronic diastolic (congestive) heart failure: Secondary | ICD-10-CM

## 2019-09-21 DIAGNOSIS — I10 Essential (primary) hypertension: Secondary | ICD-10-CM | POA: Diagnosis not present

## 2019-09-21 DIAGNOSIS — I4819 Other persistent atrial fibrillation: Secondary | ICD-10-CM

## 2019-09-21 DIAGNOSIS — E785 Hyperlipidemia, unspecified: Secondary | ICD-10-CM

## 2019-09-21 NOTE — Patient Instructions (Signed)
Medication Instructions:  The current medical regimen is effective;  continue present plan and medications as directed. Please refer to the Current Medication list given to you today. *If you need a refill on your cardiac medications before your next appointment, please call your pharmacy*  Special Instructions Please try to avoid these triggers:  Do not use any products that have nicotine or tobacco in them. These include cigarettes, e-cigarettes, and chewing tobacco. If you need help quitting, ask your doctor.  Eat heart-healthy foods. Talk with your doctor about the right eating plan for you.  Exercise regularly as told by your doctor.  Do not drink alcohol, Caffeine or chocolate.  Lose weight if you are overweight.  Do not use drugs, including cannabis  PLEASE READ AND FOLLOW SALTY 6-ATTACHED  PLEASE INCREASE PHYSICAL ACTIVITY AS TOLERATED  Follow-Up: Your next appointment:  6 month(s) Please call our office 2 months in advance to schedule this appointment In Person with K. Mali Hilty, MD  At [facility], you and your health needs are our priority.  As part of our continuing mission to provide you with exceptional heart care, we have created designated Provider Care Teams.  These Care Teams include your primary Cardiologist (physician) and Advanced Practice Providers (APPs -  Physician Assistants and Nurse Practitioners) who all work together to provide you with the care you need, when you need it.

## 2019-09-23 DIAGNOSIS — D0462 Carcinoma in situ of skin of left upper limb, including shoulder: Secondary | ICD-10-CM | POA: Diagnosis not present

## 2019-09-23 DIAGNOSIS — D044 Carcinoma in situ of skin of scalp and neck: Secondary | ICD-10-CM | POA: Diagnosis not present

## 2019-09-23 DIAGNOSIS — D485 Neoplasm of uncertain behavior of skin: Secondary | ICD-10-CM | POA: Diagnosis not present

## 2019-09-23 DIAGNOSIS — L820 Inflamed seborrheic keratosis: Secondary | ICD-10-CM | POA: Diagnosis not present

## 2019-09-23 DIAGNOSIS — Z85828 Personal history of other malignant neoplasm of skin: Secondary | ICD-10-CM | POA: Diagnosis not present

## 2019-09-23 DIAGNOSIS — L570 Actinic keratosis: Secondary | ICD-10-CM | POA: Diagnosis not present

## 2019-09-29 ENCOUNTER — Ambulatory Visit (INDEPENDENT_AMBULATORY_CARE_PROVIDER_SITE_OTHER): Payer: PPO | Admitting: *Deleted

## 2019-09-29 DIAGNOSIS — I495 Sick sinus syndrome: Secondary | ICD-10-CM

## 2019-09-29 LAB — CUP PACEART REMOTE DEVICE CHECK
Battery Remaining Longevity: 111 mo
Battery Voltage: 2.99 V
Brady Statistic AP VP Percent: 0 %
Brady Statistic AP VS Percent: 0 %
Brady Statistic AS VP Percent: 0 %
Brady Statistic AS VS Percent: 0 %
Brady Statistic RA Percent Paced: 0.54 %
Brady Statistic RV Percent Paced: 99.16 %
Date Time Interrogation Session: 20210713201415
Implantable Lead Implant Date: 20180706
Implantable Lead Implant Date: 20180706
Implantable Lead Location: 753859
Implantable Lead Location: 753860
Implantable Lead Model: 5076
Implantable Lead Model: 5076
Implantable Pulse Generator Implant Date: 20180706
Lead Channel Impedance Value: 323 Ohm
Lead Channel Impedance Value: 513 Ohm
Lead Channel Impedance Value: 532 Ohm
Lead Channel Impedance Value: 551 Ohm
Lead Channel Pacing Threshold Amplitude: 0.5 V
Lead Channel Pacing Threshold Pulse Width: 0.4 ms
Lead Channel Sensing Intrinsic Amplitude: 0.625 mV
Lead Channel Sensing Intrinsic Amplitude: 0.625 mV
Lead Channel Sensing Intrinsic Amplitude: 9.625 mV
Lead Channel Sensing Intrinsic Amplitude: 9.625 mV
Lead Channel Setting Pacing Amplitude: 2 V
Lead Channel Setting Pacing Amplitude: 2.5 V
Lead Channel Setting Pacing Pulse Width: 0.4 ms
Lead Channel Setting Sensing Sensitivity: 2.8 mV

## 2019-09-30 NOTE — Progress Notes (Signed)
Remote pacemaker transmission.   

## 2019-10-05 NOTE — Progress Notes (Signed)
I connected with Vincent Allen today by telephone and verified that I am speaking with the correct person using two identifiers. Location patient: home Location provider: work Persons participating in the virtual visit: patient, Marine scientist.    I discussed the limitations, risks, security and privacy concerns of performing an evaluation and management service by telephone and the availability of in person appointments. I also discussed with the patient that there may be a patient responsible charge related to this service. The patient expressed understanding and verbally consented to this telephonic visit.    Interactive audio and video telecommunications were attempted between this provider and patient, however failed, due to patient having technical difficulties OR patient did not have access to video capability.  We continued and completed visit with audio only.  Some vital signs may be absent or patient reported.     Subjective:   Vincent Allen is a 86 y.o. male who presents for Medicare Annual/Subsequent preventive examination.  Review of Systems     Cardiac Risk Factors include: advanced age (>43men, >65 women);diabetes mellitus;hypertension;male gender     Objective:      Advanced Directives 10/06/2019 11/12/2016 10/05/2016 09/20/2016 09/20/2016 08/20/2016 07/09/2016  Does Patient Have a Medical Advance Directive? Yes Yes No Yes Yes Yes No  Type of Paramedic of [place];Living will [place];Living will - - Living will;Healthcare Power of [place];Living will -  Does patient want to make changes to medical advance directive? No - Patient declined - - No - Patient declined - - -  Copy of [place] in Chart? No - copy requested - - - - - -  Would patient like information on creating a medical advance directive? - - - - - - No - Patient declined  Pre-existing out of facility DNR order (yellow form or pink MOST  form) - - - - - - -    Current Medications (verified) Outpatient Encounter Medications as of 10/06/2019  Medication Sig   amLODipine (NORVASC) 5 MG tablet TAKE 1 TABLET BY MOUTH EVERY DAY   Calcium 600 MG tablet Take 1 tablet (600 mg total) by mouth 2 (two) times daily.   ELIQUIS 2.5 MG TABS tablet TAKE 1 TABLET BY MOUTH TWICE A DAY   furosemide (LASIX) 20 MG tablet TAKE 1 TABLET (20 MG TOTAL) BY MOUTH DAILY AS NEEDED FOR EDEMA (WEIGHT GAIN >3LBS IN 24 HRS, SOB).   glipiZIDE (GLUCOTROL) 10 MG tablet TAKE 2 TABLETS BY MOUTH TWICE A DAY BEFORE MEALS   glucose blood (ONE TOUCH ULTRA TEST) test strip USE 1 STRIP 2 TIMES DAILY TO CHECK BLOOD SUGAR DX E08.22, N18.3   JANUVIA 100 MG tablet TAKE 1/2 TO 1 TABLET BY MOUTH DAILY   meclizine (ANTIVERT) 25 MG tablet Take 1 tablet (25 mg total) by mouth 3 (three) times daily as needed for dizziness.   metoprolol succinate (TOPROL-XL) 25 MG 24 hr tablet TAKE 1 TABLET BY MOUTH EVERY DAY   triamterene-hydrochlorothiazide (MAXZIDE) 75-50 MG tablet TAKE 1 TABLET BY MOUTH EVERY DAY   atorvastatin (LIPITOR) 20 MG tablet Take 1 tablet (20 mg total) by mouth daily.   No facility-administered encounter medications on file as of 10/06/2019.    Allergies (verified) Penicillins, Verapamil, and Influenza vaccines   History: Past Medical History:  Diagnosis Date   Arthritis    Bradycardia    Cataract    Chronic kidney disease stage III (GFR 30-59 ml/min) 05/03/2012   Colon polyps  Complication of anesthesia    emesis   Diabetes mellitus    GERD (gastroesophageal reflux disease)    HOH (hard of hearing)    HTN (hypertension) 01/13/2013   Hyperlipidemia    Hypertension    Pacemaker 12/2016   Persistent atrial fibrillation ([place])    Presence of permanent cardiac pacemaker 09/20/2016   Symptomatic bradycardia    Vitamin D deficiency 07/17/2015   Past Surgical History:  Procedure Laterality Date   ANKLE ARTHROPLASTY      CARDIOVERSION N/A 08/20/2016   Procedure: CARDIOVERSION;  Surgeon: Sanda Klein, MD;  Location: [facility];  Service: Cardiovascular;  Laterality: N/A;   CATARACT EXTRACTION  2010, 2014   CATARACT EXTRACTION  02/28/14   CHOLECYSTECTOMY     CHOLECYSTECTOMY N/A 05/03/2012   Procedure: LAPAROSCOPIC CHOLECYSTECTOMY WITH INTRAOPERATIVE CHOLANGIOGRAM;  Surgeon: [name], MD;  Location: [facility];  Service: General;  Laterality: N/A;   INSERT / REPLACE / REMOVE PACEMAKER  09/20/2016   PACEMAKER IMPLANT N/A 09/20/2016   Procedure: Pacemaker Implant;  Surgeon: Deboraha Sprang, MD;  Location: [facility];  Service: Cardiovascular;  Laterality: N/A;   ROTATOR CUFF REPAIR     SHOULDER SURGERY     TONSILLECTOMY     Family History  Problem Relation Age of Onset   Cancer Mother        colon, breast, pancreas, skin cancer   Heart disease Father        heart valve replaced   Stroke Father    Diabetes Father    Cancer Paternal Grandmother        colon   Obesity Son    Heart disease Son        bradycardia   Social History   Socioeconomic History   Marital status: Widowed    Spouse name: Not on file   Number of children: Not on file   Years of education: Not on file   Highest education level: Not on file  Occupational History   Not on file  Tobacco Use   Smoking status: Never Smoker   Smokeless tobacco: Never Used  Vaping Use   Vaping Use: Never used  Substance and Sexual Activity   Alcohol use: Yes    Alcohol/week: 1.0 standard drink    Types: 1 Glasses of wine per week   Drug use: No   Sexual activity: Not on file    Comment: lives alone , no major dietary restrictions, 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.  Other Topics Concern   Not on file  Social History Narrative   Not on file   Social Determinants of Health   Financial Resource Strain: Low Risk    Difficulty of Paying Living Expenses: Not hard at all  Food  Insecurity: No Food Insecurity   Worried About Charity fundraiser in the Last Year: Never true   [place] in the Last Year: Never true  Transportation Needs: No Transportation Needs   Lack of Transportation (Medical): No   Lack of Transportation (Non-Medical): No  Physical Activity:    Days of Exercise per Week:    Minutes of Exercise per Session:   Stress:    Feeling of Stress :   Social Connections:    Frequency of Communication with Friends and Family:    Frequency of Social Gatherings with Friends and Family:    Attends Religious Services:    Active Member of Clubs or Organizations:  Attends Archivist Meetings:    Marital Status:     Tobacco Counseling Counseling given: Not Answered   Clinical Intake:  Pain : No/denies pain       Activities of Daily Living In your present state of health, do you have any difficulty performing the following activities: 10/06/2019  Hearing? N  Vision? N  Difficulty concentrating or making decisions? N  Walking or climbing stairs? N  Dressing or bathing? N  Doing errands, shopping? N  Preparing Food and eating ? N  Using the Toilet? N  In the past six months, have you accidently leaked urine? N  Do you have problems with loss of bowel control? N  Managing your Medications? N  Managing your Finances? N  Housekeeping or managing your Housekeeping? N  Some recent data might be hidden    Patient Care Team: [name], MD as PCP - General (Family Medicine) Debara Pickett Nadean Corwin, MD as PCP - Cardiology (Cardiology) Clent Jacks, MD as Consulting Physician (Ophthalmology) Danella Sensing, MD as Consulting Physician (Dermatology) Dorothy Spark, MD as Consulting Physician (Cardiology) [name], MD as Consulting Physician (Orthopedic Surgery) Regal, Tamala Fothergill, DPM as Consulting Physician (Podiatry) Day, Melvenia Beam, [facility] as Pharmacist (Pharmacist)  Indicate any recent Medical Services you may  have received from other than [facility] providers in the past year (date may be approximate).     Assessment:   This is a routine wellness examination for [place].   Dietary issues and exercise activities discussed: Current Exercise Habits: The patient does not participate in regular exercise at present, Exercise limited by: None identified Diet (meal preparation, eat out, water intake, caffeinated beverages, dairy products, fruits and vegetables): well balanced  Goals     Maintain health and independence      Depression Screen PHQ 2/9 Scores 10/06/2019 09/14/2019 06/29/2019 12/18/2017 07/25/2016 01/23/2016 11/11/2014  PHQ - 2 Score 0 0 0 0 0 0 0  PHQ- 9 Score - 0 0 - - - -    Fall Risk Fall Risk  10/06/2019 09/14/2019 02/10/2019 12/18/2017 07/25/2016  Falls in the past year? 0 0 0 No Yes  Comment - - Emmi Telephone Survey: data to providers prior to load - -  Number falls in past yr: 0 - - - 1  Injury with Fall? 0 - - - No  Comment - - - - Just some bruising.  Follow up Education provided;Falls prevention discussed - - - -   Lives alone in story home. Son visits daily. Any stairs in or around the home? No  If so, are there any without handrails? No  Home free of loose throw rugs in walkways, pet beds, electrical cords, etc? Yes  Adequate lighting in your home to reduce risk of falls? Yes   ASSISTIVE DEVICES UTILIZED TO PREVENT FALLS:  Life alert? No  Use of a cane, walker or w/c? Yes  Grab bars in the bathroom? Yes  Shower chair or bench in shower? No  Elevated toilet seat or a handicapped toilet? No    Cognitive Function: Ad8 score reviewed for issues:  Issues making decisions:no  Less interest in hobbies / activities:no  Repeats questions, stories (family complaining):no  Trouble using ordinary gadgets (microwave, computer, phone):no  Forgets the month or year:no  Mismanaging finances: no  Remembering appts:no Daily problems with thinking and/or memory:no Ad8 score  is=0         Immunizations Immunization History  Administered Date(s) Administered   Pneumococcal Conjugate-13 03/23/2015  Pneumococcal Polysaccharide-23 06/27/2011   Tdap 05/19/2014    TDAP status: Up to date Pneumococcal vaccine status: Up to date Covid-19 vaccine status: Declined, Education has been provided regarding the importance of this vaccine but patient still declined. Advised may receive this vaccine at local pharmacy or Health Dept.or vaccine clinic. Aware to provide a copy of the vaccination record if obtained from local pharmacy or Health Dept. Verbalized acceptance and understanding.   Screening Tests Health Maintenance  Topic Date Due   COVID-19 Vaccine (1) Never done   URINE MICROALBUMIN  07/15/2017   FOOT EXAM  06/16/2019   INFLUENZA VACCINE  10/17/2019   HEMOGLOBIN A1C  03/15/2020   OPHTHALMOLOGY EXAM  05/25/2020   TETANUS/TDAP  05/18/2024   PNA vac Low Risk Adult  Completed    Health Maintenance  Health Maintenance Due  Topic Date Due   COVID-19 Vaccine (1) Never done   URINE MICROALBUMIN  07/15/2017   FOOT EXAM  06/16/2019    Colorectal cancer screening: No longer required.   Lung Cancer Screening: (Low Dose CT Chest recommended if Age 74-80 years, 30 pack-year currently smoking OR have quit w/in 15years.) does not qualify.    Additional Screening:   Vision Screening: Recommended annual ophthalmology exams for early detection of glaucoma and other disorders of the eye. Is the patient up to date with their annual eye exam?  Yes  Who is the provider or what is the name of the office in which the patient attends annual eye exams? Dr.Groat.   Dental Screening: Recommended annual dental exams for proper oral hygiene  Community Resource Referral / Chronic Care Management: CRR required this visit?  No   CCM required this visit?  No      Plan:    Please schedule your next medicare wellness visit with me in 1 yr.  Continue  to eat heart healthy diet (full of fruits, vegetables, whole grains, lean protein, water--limit salt, fat, and sugar intake) and increase physical activity as tolerated.  Continue doing brain stimulating activities (puzzles, reading, adult coloring books, staying active) to keep memory sharp.   Bring a copy of your living will and/or healthcare power of attorney to your next office visit.  I have personally reviewed and noted the following in the patients chart:    Medical and social history  Use of alcohol, tobacco or illicit drugs   Current medications and supplements  Functional ability and status  Nutritional status  Physical activity  Advanced directives  List of other physicians  Hospitalizations, surgeries, and ER visits in previous 12 months  Vitals  Screenings to include cognitive, depression, and falls  Referrals and appointments  In addition, I have reviewed and discussed with patient certain preventive protocols, quality metrics, and best practice recommendations. A written personalized care plan for preventive services as well as general preventive health recommendations were provided to patient.   Due to this being a telephonic visit, the after visit summary with patients personalized plan was offered to patient via mail or my-chart. Patient would like to access on my-chart.   Naaman Plummer [place], [place]   10/06/2019

## 2019-10-06 ENCOUNTER — Encounter: Payer: Self-pay | Admitting: *Deleted

## 2019-10-06 ENCOUNTER — Other Ambulatory Visit: Payer: Self-pay

## 2019-10-06 ENCOUNTER — Ambulatory Visit (INDEPENDENT_AMBULATORY_CARE_PROVIDER_SITE_OTHER): Payer: PPO | Admitting: *Deleted

## 2019-10-06 DIAGNOSIS — Z0000 Encounter for general adult medical examination without abnormal findings: Secondary | ICD-10-CM | POA: Diagnosis not present

## 2019-10-06 NOTE — Patient Instructions (Signed)
Please schedule your next medicare wellness visit with me in 1 yr.  Continue to eat heart healthy diet (full of fruits, vegetables, whole grains, lean protein, water--limit salt, fat, and sugar intake) and increase physical activity as tolerated.  Continue doing brain stimulating activities (puzzles, reading, adult coloring books, staying active) to keep memory sharp.   Bring a copy of your living will and/or healthcare power of attorney to your next office visit.  [name] , Thank you for taking time to come for your Medicare Wellness Visit. I appreciate your ongoing commitment to your health goals. Please review the following plan we discussed and let me know if I can assist you in the future.   These are the goals we discussed: Goals    . Maintain health and independence       This is a list of the screening recommended for you and due dates:  Health Maintenance  Topic Date Due  . COVID-19 Vaccine (1) Never done  . Urine Protein Check  07/15/2017  . Complete foot exam   06/16/2019  . Flu Shot  10/17/2019  . Hemoglobin A1C  03/15/2020  . Eye exam for diabetics  05/25/2020  . Tetanus Vaccine  05/18/2024  . Pneumonia vaccines  Completed    Preventive Care 84 Years and Older, Male Preventive care refers to lifestyle choices and visits with your health care provider that can promote health and wellness. This includes:  A yearly physical exam. This is also called an annual well check.  Regular dental and eye exams.  Immunizations.  Screening for certain conditions.  Healthy lifestyle choices, such as diet and exercise. What can I expect for my preventive care visit? Physical exam Your health care provider will check:  Height and weight. These may be used to calculate body mass index (BMI), which is a measurement that tells if you are at a healthy weight.  Heart rate and blood pressure.  Your skin for abnormal spots. Counseling Your health care provider may ask you  questions about:  Alcohol, tobacco, and drug use.  Emotional well-being.  Home and relationship well-being.  Sexual activity.  Eating habits.  History of falls.  Memory and ability to understand (cognition).  Work and work Statistician. What immunizations do I need?  Influenza (flu) vaccine  This is recommended every year. Tetanus, diphtheria, and pertussis (Tdap) vaccine  You may need a Td booster every 10 years. Varicella (chickenpox) vaccine  You may need this vaccine if you have not already been vaccinated. Zoster (shingles) vaccine  You may need this after age 72. Pneumococcal conjugate (PCV13) vaccine  One dose is recommended after age 41. Pneumococcal polysaccharide (PPSV23) vaccine  One dose is recommended after age 27. Measles, mumps, and rubella (MMR) vaccine  You may need at least one dose of MMR if you were born in 1957 or later. You may also need a second dose. Meningococcal conjugate (MenACWY) vaccine  You may need this if you have certain conditions. Hepatitis A vaccine  You may need this if you have certain conditions or if you travel or work in places where you may be exposed to hepatitis A. Hepatitis B vaccine  You may need this if you have certain conditions or if you travel or work in places where you may be exposed to hepatitis B. Haemophilus influenzae type b (Hib) vaccine  You may need this if you have certain conditions. You may receive vaccines as individual doses or as more than one vaccine together  in one shot (combination vaccines). Talk with your health care provider about the risks and benefits of combination vaccines. What tests do I need? Blood tests  Lipid and cholesterol levels. These may be checked every 5 years, or more frequently depending on your overall health.  Hepatitis C test.  Hepatitis B test. Screening  Lung cancer screening. You may have this screening every year starting at age 3 if you have a 30-pack-year  history of smoking and currently smoke or have quit within the past 15 years.  Colorectal cancer screening. All adults should have this screening starting at age 35 and continuing until age 46. Your health care provider may recommend screening at age 81 if you are at increased risk. You will have tests every 1-10 years, depending on your results and the type of screening test.  Prostate cancer screening. Recommendations will vary depending on your family history and other risks.  Diabetes screening. This is done by checking your blood sugar (glucose) after you have not eaten for a while (fasting). You may have this done every 1-3 years.  Abdominal aortic aneurysm (AAA) screening. You may need this if you are a current or former smoker.  Sexually transmitted disease (STD) testing. Follow these instructions at home: Eating and drinking  Eat a diet that includes fresh fruits and vegetables, whole grains, lean protein, and low-fat dairy products. Limit your intake of foods with high amounts of sugar, saturated fats, and salt.  Take vitamin and mineral supplements as recommended by your health care provider.  Do not drink alcohol if your health care provider tells you not to drink.  If you drink alcohol: ? Limit how much you have to 0-2 drinks a day. ? Be aware of how much alcohol is in your drink. In the [place] one drink equals one 12 oz bottle of beer (355 mL), one 5 oz glass of wine (148 mL), or one 1 oz glass of hard liquor (44 mL). Lifestyle  Take daily care of your teeth and gums.  Stay active. Exercise for at least 30 minutes on 5 or more days each week.  Do not use any products that contain nicotine or tobacco, such as cigarettes, e-cigarettes, and chewing tobacco. If you need help quitting, ask your health care provider.  If you are sexually active, practice safe sex. Use a condom or other form of protection to prevent STIs (sexually transmitted infections).  Talk with your  health care provider about taking a low-dose aspirin or statin. What's next?  Visit your health care provider once a year for a well check visit.  Ask your health care provider how often you should have your eyes and teeth checked.  Stay up to date on all vaccines. This information is not intended to replace advice given to you by your health care provider. Make sure you discuss any questions you have with your health care provider. Document Revised: 02/26/2018 Document Reviewed: 02/26/2018 Elsevier Patient Education  2020 [employer].

## 2019-10-27 ENCOUNTER — Encounter: Payer: Self-pay | Admitting: Podiatry

## 2019-10-27 ENCOUNTER — Other Ambulatory Visit: Payer: Self-pay

## 2019-10-27 ENCOUNTER — Ambulatory Visit: Payer: PPO | Admitting: Podiatry

## 2019-10-27 DIAGNOSIS — N183 Chronic kidney disease, stage 3 unspecified: Secondary | ICD-10-CM | POA: Diagnosis not present

## 2019-10-27 DIAGNOSIS — M79675 Pain in left toe(s): Secondary | ICD-10-CM | POA: Diagnosis not present

## 2019-10-27 DIAGNOSIS — B351 Tinea unguium: Secondary | ICD-10-CM | POA: Diagnosis not present

## 2019-10-27 DIAGNOSIS — M79674 Pain in right toe(s): Secondary | ICD-10-CM | POA: Diagnosis not present

## 2019-10-27 DIAGNOSIS — E0822 Diabetes mellitus due to underlying condition with diabetic chronic kidney disease: Secondary | ICD-10-CM

## 2019-10-27 NOTE — Progress Notes (Signed)
Subjective:  Patient ID: Vincent Allen, male    DOB: 1934/03/07,  MRN: [id number]  86 y.o. male presents with at risk foot care. Pt has h/o NIDDM with chronic kidney disease and painful thick toenails that are difficult to trim. Pain interferes with ambulation. Aggravating factors include wearing enclosed shoe gear. Pain is relieved with periodic professional debridement.   He states his feet are swollen because he hasn't taken his fluid pill this morning. "It will run me like a pipe." He voices no other pedal concerns on today's visit.    Review of Systems: Negative except as noted in the HPI.  Past Medical History:  Diagnosis Date  . Arthritis   . Bradycardia   . Cataract   . Chronic kidney disease stage III (GFR 30-59 ml/min) 05/03/2012  . Colon polyps   . Complication of anesthesia    emesis  . Diabetes mellitus   . GERD (gastroesophageal reflux disease)   . HOH (hard of hearing)   . HTN (hypertension) 01/13/2013  . Hyperlipidemia   . Hypertension   . Pacemaker 12/2016  . Persistent atrial fibrillation ([place])   . Presence of permanent cardiac pacemaker 09/20/2016  . Symptomatic bradycardia   . Vitamin D deficiency 07/17/2015   Past Surgical History:  Procedure Laterality Date  . ANKLE ARTHROPLASTY    . CARDIOVERSION N/A 08/20/2016   Procedure: CARDIOVERSION;  Surgeon: Sanda Klein, MD;  Location: [facility];  Service: Cardiovascular;  Laterality: N/A;  . CATARACT EXTRACTION  2010, 2014  . CATARACT EXTRACTION  02/28/14  . CHOLECYSTECTOMY    . CHOLECYSTECTOMY N/A 05/03/2012   Procedure: LAPAROSCOPIC CHOLECYSTECTOMY WITH INTRAOPERATIVE CHOLANGIOGRAM;  Surgeon: [name], MD;  Location: [facility];  Service: General;  Laterality: N/A;  . INSERT / REPLACE / REMOVE PACEMAKER  09/20/2016  . PACEMAKER IMPLANT N/A 09/20/2016   Procedure: Pacemaker Implant;  Surgeon: Deboraha Sprang, MD;  Location: [facility];  Service: Cardiovascular;  Laterality: N/A;  . ROTATOR CUFF  REPAIR    . SHOULDER SURGERY    . TONSILLECTOMY     Patient Active Problem List   Diagnosis Date Noted  . Knee pain, bilateral 09/20/2019  . Morbid obesity ([place]) 09/14/2019  . Urinary frequency 11/06/2016  . S/P placement of cardiac pacemaker 10/24/2016  . Chronic diastolic CHF (congestive heart failure) ([place]) 10/24/2016  . Cardiac device in situ   . Debility 08/15/2016  . Persistent atrial fibrillation ([place]) 07/09/2016  . Vitamin D deficiency 07/17/2015  . Preventative health care 07/17/2015  . Lumbago 12/02/2014  . Pain of toe of left foot 03/14/2014  . Diabetes mellitus type 2 in obese ([place]) 12/12/2013  . Medicare annual wellness visit, subsequent 01/17/2013  . Essential hypertension 01/13/2013  . Chronic kidney disease stage III (GFR 30-59 ml/min) 05/03/2012  . Insomnia 06/27/2011  . Osteopenia 09/02/2010  . Family history of colon cancer 09/02/2010  . Diabetes mellitus with chronic kidney disease ([place])   . Hypertensive heart disease   . Hyperlipidemia LDL goal <70   . Carotid artery disease ([place])   . GERD (gastroesophageal reflux disease)   . CTS (carpal tunnel syndrome) 08/28/2010    Current Outpatient Medications:  .  amLODipine (NORVASC) 5 MG tablet, TAKE 1 TABLET BY MOUTH EVERY DAY, Disp: 90 tablet, Rfl: 1 .  atorvastatin (LIPITOR) 20 MG tablet, Take 1 tablet (20 mg total) by mouth daily., Disp: 90 tablet, Rfl: 3 .  Calcium 600 MG tablet, Take 1 tablet (600 mg total) by  mouth 2 (two) times daily., Disp: 60 tablet, Rfl: 0 .  ELIQUIS 2.5 MG TABS tablet, TAKE 1 TABLET BY MOUTH TWICE A DAY, Disp: 60 tablet, Rfl: 5 .  furosemide (LASIX) 20 MG tablet, TAKE 1 TABLET (20 MG TOTAL) BY MOUTH DAILY AS NEEDED FOR EDEMA (WEIGHT GAIN >3LBS IN 24 HRS, SOB)., Disp: 30 tablet, Rfl: 2 .  glipiZIDE (GLUCOTROL) 10 MG tablet, TAKE 2 TABLETS BY MOUTH TWICE A DAY BEFORE MEALS, Disp: 360 tablet, Rfl: 0 .  glucose blood (ONE TOUCH ULTRA TEST) test strip, USE 1 STRIP 2 TIMES DAILY TO CHECK  BLOOD SUGAR DX E08.22, N18.3, Disp: 300 each, Rfl: 1 .  JANUVIA 100 MG tablet, TAKE 1/2 TO 1 TABLET BY MOUTH DAILY, Disp: 30 tablet, Rfl: 5 .  meclizine (ANTIVERT) 25 MG tablet, Take 1 tablet (25 mg total) by mouth 3 (three) times daily as needed for dizziness., Disp: 30 tablet, Rfl: 0 .  metoprolol succinate (TOPROL-XL) 25 MG 24 hr tablet, TAKE 1 TABLET BY MOUTH EVERY DAY, Disp: 90 tablet, Rfl: 0 .  triamterene-hydrochlorothiazide (MAXZIDE) 75-50 MG tablet, TAKE 1 TABLET BY MOUTH EVERY DAY, Disp: 90 tablet, Rfl: 1 Allergies  Allergen Reactions  . Penicillins Hives and Itching    Has patient had a PCN reaction causing immediate rash, facial/tongue/throat swelling, SOB or lightheadedness with hypotension:Yes Has patient had a PCN reaction causing severe rash involving mucus membranes or skin necrosis:No Has patient had a PCN reaction that required hospitalization:No Has patient had a PCN reaction occurring within the last 10 years:No If all of the above answers are "NO", then may proceed with Cephalosporin use.   . Verapamil     Junctional rhythm  . Influenza Vaccines Rash   Social History   Tobacco Use  Smoking Status Never Smoker  Smokeless Tobacco Never Used   Objective:  There were no vitals filed for this visit. Constitutional Patient is a pleasant 86 y.o. Caucasian male WD, WN in NAD. AAO x 3.  Vascular Capillary fill time to digits <3 seconds b/l lower extremities. Palpable pedal pulses b/l LE. Pedal hair absent. Lower extremity skin temperature gradient within normal limits. Nonpitting edema noted b/l lower extremities. No cyanosis or clubbing noted.  Neurologic Normal speech. Oriented to person, place, and time. Protective sensation intact 5/5 intact bilaterally with 10g monofilament b/l. Vibratory sensation intact b/l. Proprioception intact bilaterally.  Dermatologic Pedal skin with normal turgor, texture and tone bilaterally. No interdigital macerations bilaterally. Toenails  1-5 b/l elongated, discolored, dystrophic, thickened, crumbly with subungual debris and tenderness to dorsal palpation. Healing abrasion dorsal left 2nd digit.  Orthopedic: Normal muscle strength 5/5 to all lower extremity muscle groups bilaterally. No pain crepitus or joint limitation noted with ROM b/l. No gross bony deformities bilaterally. Utilizes cane for ambulation assistance.   Hemoglobin A1C Latest Ref Rng & Units 09/14/2019 06/10/2019 02/15/2019 11/03/2018  HGBA1C 4.6 - 6.5 % 7.7(H) 7.4(H) 8.4(H) 8.4(H)  Some recent data might be hidden   Assessment:  No diagnosis found. Plan:  Patient was evaluated and treated and all questions answered.  Onychomycosis with pain -Nails palliatively debridement as below. -Educated on self-care  Procedure: Nail Debridement Rationale: Pain Type of Debridement: manual, sharp debridement. Instrumentation: Nail nipper, rotary burr. Number of Nails: 10  -Examined patient. -Continue diabetic foot care principles. -Healing abrasion left 2nd toe with no need for treatment. -Toenails 1-5 b/l were debrided in length and girth with sterile nail nippers and dremel without iatrogenic bleeding.  -Patient to report any  pedal injuries to medical professional immediately. -Patient to continue soft, supportive shoe gear daily. -Patient/POA to call should there be question/concern in the interim.  Return in about 4 months (around 02/26/2020) for nail trim.  Marzetta Board, DPM

## 2019-11-04 ENCOUNTER — Other Ambulatory Visit: Payer: Self-pay

## 2019-11-04 ENCOUNTER — Ambulatory Visit: Payer: PPO | Admitting: Pharmacist

## 2019-11-04 DIAGNOSIS — I10 Essential (primary) hypertension: Secondary | ICD-10-CM

## 2019-11-04 DIAGNOSIS — I4819 Other persistent atrial fibrillation: Secondary | ICD-10-CM

## 2019-11-04 DIAGNOSIS — E0822 Diabetes mellitus due to underlying condition with diabetic chronic kidney disease: Secondary | ICD-10-CM

## 2019-11-04 DIAGNOSIS — E785 Hyperlipidemia, unspecified: Secondary | ICD-10-CM

## 2019-11-04 NOTE — Chronic Care Management (AMB) (Signed)
Chronic Care Management Pharmacy  Name: Vincent Allen  MRN: [id number] DOB: 03-11-1934  Chief Complaint/ HPI  Vincent Allen,  86 y.o. , male presents for their Initial CCM visit with the clinical pharmacist via telephone due to COVID-19 Pandemic.  PCP : [name], MD  Their chronic conditions include: Hypertension, Hyperlipidemia, Diabetes, AFib, Heart Failure  Office Visits: 10/06/19: Medicare Annual Wellness Exam w/ Naaman Plummer, RN -  Goal updated to maintain health and independence   09/14/19: Visit w/ Dr. [name] -  Palpitations. Referral to cardio. Bilateral knee pain. Try NSAIDs. Inform when referral needed. No med changes noted.  08/24/19: BP recheck. BP 126/76. No med changes noted.  06/29/19: Visit w/ Dr. [name] -  BG has increased since D/C metformin. Consider Trulicity vs Lantus.  06/10/19: BP recheck. BP 140/84. No med changes noted.  Consult Visit: 10/27/19: Podiatry visit w/ Dr. Elisha Ponder - Nail debridement  09/24/19: Cardio visit w/ Coletta Memos, NP - Tachycardia. EKG shows sinus rhythm with A-V dissociation. No med changes noted. RTC in 6 months w/ Dr. Debara Pickett.   06/23/19: Podiatry visit w/ Dr. Elisha Ponder - Nail debridement  Medications: Outpatient Encounter Medications as of 11/04/2019  Medication Sig  . amLODipine (NORVASC) 5 MG tablet TAKE 1 TABLET BY MOUTH EVERY Vincent Allen  . Calcium 600 MG tablet Take 1 tablet (600 mg total) by mouth 2 (two) times daily.  Marland Kitchen ELIQUIS 2.5 MG TABS tablet TAKE 1 TABLET BY MOUTH TWICE A Vincent Allen  . furosemide (LASIX) 20 MG tablet TAKE 1 TABLET (20 MG TOTAL) BY MOUTH DAILY AS NEEDED FOR EDEMA (WEIGHT GAIN >3LBS IN 24 HRS, SOB).  Marland Kitchen glipiZIDE (GLUCOTROL) 10 MG tablet TAKE 2 TABLETS BY MOUTH TWICE A Vincent Allen BEFORE MEALS  . JANUVIA 100 MG tablet TAKE 1/2 TO 1 TABLET BY MOUTH DAILY  . metoprolol succinate (TOPROL-XL) 25 MG 24 hr tablet TAKE 1 TABLET BY MOUTH EVERY Vincent Allen  . triamterene-hydrochlorothiazide (MAXZIDE) 75-50 MG tablet TAKE 1 TABLET BY MOUTH  EVERY Vincent Allen  . atorvastatin (LIPITOR) 20 MG tablet Take 1 tablet (20 mg total) by mouth daily.  Marland Kitchen glucose blood (ONE TOUCH ULTRA TEST) test strip USE 1 STRIP 2 TIMES DAILY TO CHECK BLOOD SUGAR DX E08.22, N18.3  . meclizine (ANTIVERT) 25 MG tablet Take 1 tablet (25 mg total) by mouth 3 (three) times daily as needed for dizziness. (Patient not taking: Reported on 11/04/2019)   No facility-administered encounter medications on file as of 11/04/2019.   SDOH Screenings   Alcohol Screen:   . Last Alcohol Screening Score (AUDIT): Not on file  Depression (PHQ2-9): Low Risk   . PHQ-2 Score: 0  Financial Resource Strain: Medium Risk  . Difficulty of Paying Living Expenses: Somewhat hard  Food Insecurity: No Food Insecurity  . Worried About Charity fundraiser in the Last Year: Never true  . Ran Out of Food in the Last Year: Never true  Housing: Low Risk   . Last Housing Risk Score: 0  Physical Activity:   . Days of Exercise per Week: Not on file  . Minutes of Exercise per Session: Not on file  Social Connections:   . Frequency of Communication with Friends and Family: Not on file  . Frequency of Social Gatherings with Friends and Family: Not on file  . Attends Religious Services: Not on file  . Active Member of Clubs or Organizations: Not on file  . Attends Archivist Meetings: Not on file  . Marital Status:  Not on file  Stress:   . Feeling of Stress : Not on file  Tobacco Use: Low Risk   . Smoking Tobacco Use: Never Smoker  . Smokeless Tobacco Use: Never Used  Transportation Needs: No Transportation Needs  . Lack of Transportation (Medical): No  . Lack of Transportation (Non-Medical): No      Current Diagnosis/Assessment:  Goals Addressed            This Visit's Progress   . Chronic Care Management Pharmacy Care Plan       CARE PLAN ENTRY (see longitudinal plan of care for additional care plan information)  Current Barriers:  . Chronic Disease Management support,  education, and care coordination needs related to Hypertension, Hyperlipidemia, Diabetes, AFib, Heart Failure   Hypertension BP Readings from Last 3 Encounters:  09/21/19 126/72  09/14/19 110/72  08/24/19 126/76   . Pharmacist Clinical Goal(s): o Over the next 90 days, patient will work with PharmD and providers to maintain BP goal <140/90 . Current regimen:  . Amlodipine 76m daily AM . Triamterene-hctz 75-573mdaily AM . Patient self care activities - Over the next 90 days, patient will: o Maintain hypertension medication regimen.   Hyperlipidemia Lab Results  Component Value Date/Time   LDLCALC 79 09/14/2019 10:39 AM   LDLDIRECT 65.0 11/03/2018 10:38 AM   . Pharmacist Clinical Goal(s): o Over the next 90 days, patient will work with PharmD and providers to maintain LDL goal < 100 . Current regimen:  o Diet and exercise management   . Interventions: o Consider changing atorvastatin to another cholesterol medication after next lipid panel o Consider repeat lipid panel . Patient self care activities - Over the next 90 days, patient will: o Maintain LDL <100  Diabetes Lab Results  Component Value Date/Time   HGBA1C 7.7 (H) 09/14/2019 10:39 AM   HGBA1C 7.4 (H) 06/10/2019 08:27 AM   . Pharmacist Clinical Goal(s): o Over the next 90 days, patient will work with PharmD and providers to achieve A1c goal <7% . Current regimen:  . Januvia 10085m/2 to 1 tab daily AM . Glipizide 7m25m twice daily AM and Dinner . Glucocil #2 twice daily . Interventions: o Consider PAP for Januvia or GLP1 o Requested patient to check blood sugar 2 hours after meal . Patient self care activities - Over the next 90 days, patient will: o Check blood sugar once daily, document, and provide at future appointments o Contact provider with any episodes of hypoglycemia  Afib  . Pharmacist Clinical Goal(s) o Over the next 90 days, patient will work with PharmD and providers to reduce complications  associated with AFib . Current regimen:   Eliquis 2.5mg 41mce daily AM and Dinner  Metoprolol succinate 25mg 30my AM . Interventions: o Complete Eliquis patient assistance paperwork . Patient self care activities - Over the next 90 days, patient will: o Maintain Afib medication regimen  Medication management . Pharmacist Clinical Goal(s): o Over the next 90 days, patient will work with PharmD and providers to achieve optimal medication adherence . Current pharmacy: [facility] . Interventions o Comprehensive medication review performed. o Continue current medication management strategy . Patient self care activities - Over the next 90 days, patient will: o Focus on medication adherence by filling and taking medications appropriately  o Take medications as prescribed o Report any questions or concerns to PharmD and/or provider(s)  Initial goal documentation        Hypertension   BP goal is:  <140/90  Office blood pressures are  BP Readings from Last 3 Encounters:  09/21/19 126/72  09/14/19 110/72  08/24/19 126/76   CMP Latest Ref Rng & Units 09/14/2019 08/24/2019 07/30/2019  Glucose 70 - 99 mg/dL 116(H) 150(H) 191(H)  BUN 6 - 23 mg/dL 28(H) 40(H) 50(H)  Creatinine 0.40 - 1.50 mg/dL 1.65(H) 1.79(H) 1.85(H)  Sodium 135 - 145 mEq/L 137 133(L) 133(L)  Potassium 3.5 - 5.1 mEq/L 3.8 3.9 4.2  Chloride 96 - 112 mEq/L 100 100 101  CO2 19 - 32 mEq/L _0 Calcium 8.4 - 10.5 mg/dL 9.7 9.5 8.9  Total Protein 6.0 - 8.3 g/dL 6.5 6.7 6.6  Total Bilirubin 0.2 - 1.2 mg/dL 0.7 0.8 0.7  Alkaline Phos 39 - 117 U/L 44 46 54  AST 0 - 37 U/L _1 ALT 0 - 53 U/L _2 Kidney Function Lab Results  Component Value Date/Time   CREATININE 1.65 (H) 09/14/2019 10:39 AM   CREATININE 1.79 (H) 08/24/2019 08:46 AM   CREATININE 1.16 06/20/2014 08:18 AM   CREATININE 1.41 (H) 03/14/2014 02:01 PM   GFR 39.93 (L) 09/14/2019 10:39 AM   GFRNONAA 42 (L) 09/13/2016 02:25 PM   GFRNONAA 60  06/20/2014 08:18 AM   GFRAA 48 (L) 09/13/2016 02:25 PM   GFRAA 69 06/20/2014 08:18 AM   K 3.8 09/14/2019 10:39 AM   K 3.9 08/24/2019 08:46 AM   Patient checks BP at home infrequently (hard for him to get cuff on) Patient home BP readings are ranging: Unable to assess  Patient has failed these meds in the past: lisinopril (held due to AKI in 06/2016), losartan (switched to metoprolol) Patient is currently controlled on the following medications:  . Amlodipine 19m daily AM . Triamterene-hctz 75-54mdaily AM  Denies headache or chest pains.  Plan -Continue current medications     Hyperlipidemia   LDL goal <100 (LDL goal listed <70 in chart)  Lipid Panel     Component Value Date/Time   CHOL 147 09/14/2019 1039   TRIG 166.0 (H) 09/14/2019 1039   HDL 34.30 (L) 09/14/2019 1039   LDLCALC 79 09/14/2019 1039   LDLDIRECT 65.0 11/03/2018 1038    Hepatic Function Latest Ref Rng & Units 09/14/2019 08/24/2019 07/30/2019  Total Protein 6.0 - 8.3 g/dL 6.5 6.7 6.6  Albumin 3.5 - 5.2 g/dL 3.9 4.0 3.9  AST 0 - 37 U/L _3 ALT 0 - 53 U/L _4 Alk Phosphatase 39 - 117 U/L 44 46 54  Total Bilirubin 0.2 - 1.2 mg/dL 0.7 0.8 0.7  Bilirubin, Direct 0.0 - 0.3 mg/dL - - -     The ASCVD Risk score ([place] et al., 2013) failed to calculate for the following reasons:   The 2013 ASCVD risk score is only valid for ages 4066o 7965 Patient has failed these meds in past: None noted  Patient is currently controlled on the following medications:  . Atorvastatin 2056mstates he hasn't taken since April, due to myalgia)  With hx of diabetes pt could benefit from statin therapy, but noting his age and LDL <100 could consider not restarting. Will continue this discussion at future visits. Main focus of this visit was diabetes  Plan -Continue control with diet and exercise  -Consider repeat lipid panel  Future Plan -Consider changing to more hydrophilic statin such as rosuvastatin or  pravastatin   Diabetes   A1c goal <7%  FBG:  80-130 PPBG: <180  Recent Relevant Labs: Lab Results  Component Value Date/Time   HGBA1C 7.7 (H) 09/14/2019 10:39 AM   HGBA1C 7.4 (H) 06/10/2019 08:27 AM   GFR 39.93 (L) 09/14/2019 10:39 AM   GFR 36.35 (L) 08/24/2019 08:46 AM   MICROALBUR 26.7 (H) 07/15/2016 10:22 AM   MICROALBUR 1.01 07/07/2012 09:42 AM    Last diabetic Eye exam:  Lab Results  Component Value Date/Time   HMDIABEYEEXA No Retinopathy 05/26/2019 12:00 AM    Last diabetic Foot exam: No results found for: HMDIABFOOTEX   Checking BG: Daily (fasting)  Recent FBG Readings: 82 86 118 109 86 102 190 (banana pudding and ribs the night before at the [employer]) 126 133 96 134 135 156 129 Avg 120.1  Patient has failed these meds in past: metformin (renal fx) Patient is currently uncontrolled on the following medications: . Januvia 137m 1/2 to 1 tab daily AM . Glipizide 177m#2 twice daily AM and Dinner . Glucocil #2 twice daily (dietary supplement)  Patient states cost of Januvia is a barrier. Can complete PAP for Januvia and/or consider completing PAP for GLP1 to replace DDP4 for greater a1c lowering.   Fasting BG at goal. Suspect PPBG are above goal.  Plan -Check BG 2 hrs after meals for PPBG assessment -Continue current medications   Future Plan -Consider switching Januvia to GLP1 (will need to consider PAP for GLP1)   AFIB   Patient is currently rate controlled.  Patient has failed these meds in past: verapamil (junctional rhythm) Patient is currently controlled on the following medications:  Eliquis 2.75m24mwice daily AM and Dinner  Metoprolol succinate 275m375mily AM  Patient states cost of Eliquis can be a barrier. Will complete Eliquis PAP paperwork.  Denies palpitations or bleeding.  Plan -Continue current medications   Heart Failure   Type: Diastolic  Last ejection fraction: 07/10/2016 LVEF: 55% NYHA Class: II (slight  limitation of activity) AHA HF Stage: C (Heart disease and symptoms present)  Patient has failed these meds in past: None noted  Patient is currently controlled on the following medications:  Furosemide 20mg475mneeded with weight  States he does weigh daily. Weighs 217-225. Tries to stay around 220.  Denies SOB. Reports he has baseline/minimal edema bilaterally.  Patient states he needs a refill of furosemide.  Plan -Continue current medications

## 2019-11-07 NOTE — Patient Instructions (Signed)
Visit Information  Goals Addressed            This Visit's Progress   . Chronic Care Management Pharmacy Care Plan       CARE PLAN ENTRY (see longitudinal plan of care for additional care plan information)  Current Barriers:  . Chronic Disease Management support, education, and care coordination needs related to Hypertension, Hyperlipidemia, Diabetes, AFib, Heart Failure   Hypertension BP Readings from Last 3 Encounters:  09/21/19 126/72  09/14/19 110/72  08/24/19 126/76   . Pharmacist Clinical Goal(s): o Over the next 90 days, patient will work with PharmD and providers to maintain BP goal <140/90 . Current regimen:  . Amlodipine 5mg  daily AM . Triamterene-hctz 75-50mg  daily AM . Patient self care activities - Over the next 90 days, patient will: o Maintain hypertension medication regimen.   Hyperlipidemia Lab Results  Component Value Date/Time   LDLCALC 79 09/14/2019 10:39 AM   LDLDIRECT 65.0 11/03/2018 10:38 AM   . Pharmacist Clinical Goal(s): o Over the next 90 days, patient will work with PharmD and providers to maintain LDL goal < 100 . Current regimen:  o Diet and exercise management   . Interventions: o Consider changing atorvastatin to another cholesterol medication after next lipid panel o Consider repeat lipid panel . Patient self care activities - Over the next 90 days, patient will: o Maintain LDL <100  Diabetes Lab Results  Component Value Date/Time   HGBA1C 7.7 (H) 09/14/2019 10:39 AM   HGBA1C 7.4 (H) 06/10/2019 08:27 AM   . Pharmacist Clinical Goal(s): o Over the next 90 days, patient will work with PharmD and providers to achieve A1c goal <7% . Current regimen:  . Januvia 100mg  1/2 to 1 tab daily AM . Glipizide 10mg  #2 twice daily AM and Dinner . Glucocil #2 twice daily . Interventions: o Consider PAP for Januvia or GLP1 o Requested patient to check blood sugar 2 hours after meal . Patient self care activities - Over the next 90 days,  patient will: o Check blood sugar once daily, document, and provide at future appointments o Contact provider with any episodes of hypoglycemia  Afib  . Pharmacist Clinical Goal(s) o Over the next 90 days, patient will work with PharmD and providers to reduce complications associated with AFib . Current regimen:   Eliquis 2.5mg  twice daily AM and Dinner  Metoprolol succinate 25mg  daily AM . Interventions: o Complete Eliquis patient assistance paperwork . Patient self care activities - Over the next 90 days, patient will: o Maintain Afib medication regimen  Medication management . Pharmacist Clinical Goal(s): o Over the next 90 days, patient will work with PharmD and providers to achieve optimal medication adherence . [facility] . Interventions o Comprehensive medication review performed. o Continue current medication management strategy . Patient self care activities - Over the next 90 days, patient will: o Focus on medication adherence by filling and taking medications appropriately  o Take medications as prescribed o Report any questions or concerns to PharmD and/or provider(s)  Initial goal documentation        Mr. Allemand was given information about Chronic Care Management services today including:  1. CCM service includes personalized support from designated clinical staff supervised by his physician, including individualized plan of care and coordination with other care providers 2. 24/7 contact phone numbers for assistance for urgent and routine care needs. 3. Standard insurance, coinsurance, copays and deductibles apply for chronic care management only during months in which we provide  at least 20 minutes of these services. Most insurances cover these services at 100%, however patients may be responsible for any copay, coinsurance and/or deductible if applicable. This service may help you avoid the need for more expensive face-to-face services. 4. Only one  practitioner may furnish and bill the service in a calendar month. 5. The patient may stop CCM services at any time (effective at the end of the month) by phone call to the office staff.  Patient agreed to services and verbal consent obtained.   The patient verbalized understanding of instructions provided today and agreed to receive a mailed copy of patient instruction and/or educational materials. Telephone follow up appointment with pharmacy team member scheduled for: 11/12/2019  Melvenia Beam Ladawn Boullion, PharmD Clinical Pharmacist [facility] [phone]   Cholesterol Content in Foods Cholesterol is a waxy, fat-like substance that helps to carry fat in the blood. The body needs cholesterol in small amounts, but too much cholesterol can cause damage to the arteries and heart. Most people should eat less than 200 milligrams (mg) of cholesterol a Amberley Hamler. Foods with cholesterol  Cholesterol is found in animal-based foods, such as meat, seafood, and dairy. Generally, low-fat dairy and lean meats have less cholesterol than full-fat dairy and fatty meats. The milligrams of cholesterol per serving (mg per serving) of common cholesterol-containing foods are listed below. Meat and other proteins  Egg -- one large whole egg has 186 mg.  Veal shank -- 4 oz has 141 mg.  Lean ground [place] (93% lean) -- 4 oz has 118 mg.  Fat-trimmed lamb loin -- 4 oz has 106 mg.  Lean ground beef (90% lean) -- 4 oz has 100 mg.  Lobster -- 3.5 oz has 90 mg.  Pork loin chops -- 4 oz has 86 mg.  Canned salmon -- 3.5 oz has 83 mg.  Fat-trimmed beef top loin -- 4 oz has 78 mg.  Frankfurter -- 1 frank (3.5 oz) has 77 mg.  Crab -- 3.5 oz has 71 mg.  Roasted chicken without skin, white meat -- 4 oz has 66 mg.  Light bologna -- 2 oz has 45 mg.  Deli-cut [place] -- 2 oz has 31 mg.  Canned tuna -- 3.5 oz has 31 mg.  Berniece Salines -- 1 oz has 29 mg.  Oysters and mussels (raw) -- 3.5 oz has 25  mg.  Mackerel -- 1 oz has 22 mg.  Trout -- 1 oz has 20 mg.  Pork sausage -- 1 link (1 oz) has 17 mg.  Salmon -- 1 oz has 16 mg.  Tilapia -- 1 oz has 14 mg. Dairy  Soft-serve ice cream --  cup (4 oz) has 103 mg.  Whole-milk yogurt -- 1 cup (8 oz) has 29 mg.  Cheddar cheese -- 1 oz has 28 mg.  American cheese -- 1 oz has 28 mg.  Whole milk -- 1 cup (8 oz) has 23 mg.  2% milk -- 1 cup (8 oz) has 18 mg.  Cream cheese -- 1 tablespoon (Tbsp) has 15 mg.  Cottage cheese --  cup (4 oz) has 14 mg.  Low-fat (1%) milk -- 1 cup (8 oz) has 10 mg.  Sour cream -- 1 Tbsp has 8.5 mg.  Low-fat yogurt -- 1 cup (8 oz) has 8 mg.  Nonfat Greek yogurt -- 1 cup (8 oz) has 7 mg.  Half-and-half cream -- 1 Tbsp has 5 mg. Fats and oils  Cod liver oil -- 1 tablespoon (Tbsp)  has 82 mg.  Butter -- 1 Tbsp has 15 mg.  Lard -- 1 Tbsp has 14 mg.  Bacon grease -- 1 Tbsp has 14 mg.  Mayonnaise -- 1 Tbsp has 5-10 mg.  Margarine -- 1 Tbsp has 3-10 mg. Exact amounts of cholesterol in these foods may vary depending on specific ingredients and brands. Foods without cholesterol Most plant-based foods do not have cholesterol unless you combine them with a food that has cholesterol. Foods without cholesterol include:  Grains and cereals.  Vegetables.  Fruits.  Vegetable oils, such as olive, canola, and sunflower oil.  Legumes, such as peas, beans, and lentils.  Nuts and seeds.  Egg whites. Summary  The body needs cholesterol in small amounts, but too much cholesterol can cause damage to the arteries and heart.  Most people should eat less than 200 milligrams (mg) of cholesterol a Annlee Glandon. This information is not intended to replace advice given to you by your health care provider. Make sure you discuss any questions you have with your health care provider. Document Revised: 02/14/2017 Document Reviewed: 10/29/2016 Elsevier Patient Education  [place].

## 2019-11-08 ENCOUNTER — Other Ambulatory Visit: Payer: Self-pay | Admitting: Family Medicine

## 2019-11-08 DIAGNOSIS — N183 Chronic kidney disease, stage 3 unspecified: Secondary | ICD-10-CM

## 2019-11-08 MED ORDER — OZEMPIC (0.25 OR 0.5 MG/DOSE) 2 MG/1.5ML ~~LOC~~ SOPN: PEN_INJECTOR | SUBCUTANEOUS | 1 refills | Status: DC

## 2019-11-09 ENCOUNTER — Telehealth: Payer: Self-pay | Admitting: *Deleted

## 2019-11-09 ENCOUNTER — Other Ambulatory Visit: Payer: Self-pay | Admitting: *Deleted

## 2019-11-09 DIAGNOSIS — E0822 Diabetes mellitus due to underlying condition with diabetic chronic kidney disease: Secondary | ICD-10-CM

## 2019-11-09 DIAGNOSIS — E785 Hyperlipidemia, unspecified: Secondary | ICD-10-CM

## 2019-11-09 DIAGNOSIS — I10 Essential (primary) hypertension: Secondary | ICD-10-CM

## 2019-11-09 NOTE — Progress Notes (Signed)
A 

## 2019-11-09 NOTE — Telephone Encounter (Signed)
Patient spoke with Melvenia Beam the pharmacist and requested a refill on furosemide.  His last fill was back on 03/04/18.  He stated he thinks he needs now because usually does not take that often.  Would you be ok with refill

## 2019-11-09 NOTE — Telephone Encounter (Signed)
Yes can refill for daily prn use. #30 with 1 rf

## 2019-11-10 MED ORDER — FUROSEMIDE 20 MG PO TABS: 20.0000 mg | ORAL_TABLET | Freq: Every day | ORAL | 1 refills | Status: DC | PRN

## 2019-11-10 NOTE — Telephone Encounter (Signed)
Rx sent in

## 2019-11-12 ENCOUNTER — Ambulatory Visit: Payer: PPO | Admitting: Pharmacist

## 2019-11-12 DIAGNOSIS — E0822 Diabetes mellitus due to underlying condition with diabetic chronic kidney disease: Secondary | ICD-10-CM

## 2019-11-12 DIAGNOSIS — I4819 Other persistent atrial fibrillation: Secondary | ICD-10-CM

## 2019-11-12 NOTE — Chronic Care Management (AMB) (Signed)
Chronic Care Management Pharmacy  Name: ALDOUS HOUSEL  MRN: [id number] DOB: 05-23-1933  Chief Complaint/ HPI  Vincent Allen,  86 y.o. , male presents for their Follow-Up CCM visit with the clinical pharmacist via telephone due to COVID-19 Pandemic.  PCP : [name], MD  Their chronic conditions include: Hypertension, Hyperlipidemia, Diabetes, AFib, Heart Failure  Office Visits: None since last CCM visit on 11/04/19.   Consult Visit: None since last CCM visit on 11/04/19.  Medications: Outpatient Encounter Medications as of 11/12/2019  Medication Sig  . amLODipine (NORVASC) 5 MG tablet TAKE 1 TABLET BY MOUTH EVERY Aundrey Elahi  . atorvastatin (LIPITOR) 20 MG tablet Take 1 tablet (20 mg total) by mouth daily.  . Calcium 600 MG tablet Take 1 tablet (600 mg total) by mouth 2 (two) times daily.  Marland Kitchen ELIQUIS 2.5 MG TABS tablet TAKE 1 TABLET BY MOUTH TWICE A Christion Leonhard  . furosemide (LASIX) 20 MG tablet Take 1 tablet (20 mg total) by mouth daily as needed for edema (weight gain >3lbs in 24 hrs, SOB).  Marland Kitchen glipiZIDE (GLUCOTROL) 10 MG tablet TAKE 2 TABLETS BY MOUTH TWICE A Girard Koontz BEFORE MEALS  . glucose blood (ONE TOUCH ULTRA TEST) test strip USE 1 STRIP 2 TIMES DAILY TO CHECK BLOOD SUGAR DX E08.22, N18.3  . meclizine (ANTIVERT) 25 MG tablet Take 1 tablet (25 mg total) by mouth 3 (three) times daily as needed for dizziness. (Patient not taking: Reported on 11/04/2019)  . metoprolol succinate (TOPROL-XL) 25 MG 24 hr tablet TAKE 1 TABLET BY MOUTH EVERY Sabian Kuba  . Semaglutide,0.25 or 0.5MG /DOS, (OZEMPIC, 0.25 OR 0.5 MG/DOSE,) 2 MG/1.5ML SOPN 0.25 mg SQ weekly x 4 weeks then increase to 0.5 mg weekly as needed and as directed.  . triamterene-hydrochlorothiazide (MAXZIDE) 75-50 MG tablet TAKE 1 TABLET BY MOUTH EVERY Naythan Douthit   No facility-administered encounter medications on file as of 11/12/2019.   SDOH Screenings   Alcohol Screen:   . Last Alcohol Screening Score (AUDIT): Not on file  Depression (PHQ2-9):  Low Risk   . PHQ-2 Score: 0  Financial Resource Strain: Medium Risk  . Difficulty of Paying Living Expenses: Somewhat hard  Food Insecurity: No Food Insecurity  . Worried About Charity fundraiser in the Last Year: Never true  . Ran Out of Food in the Last Year: Never true  Housing: Low Risk   . Last Housing Risk Score: 0  Physical Activity:   . Days of Exercise per Week: Not on file  . Minutes of Exercise per Session: Not on file  Social Connections:   . Frequency of Communication with Friends and Family: Not on file  . Frequency of Social Gatherings with Friends and Family: Not on file  . Attends Religious Services: Not on file  . Active Member of Clubs or Organizations: Not on file  . Attends Archivist Meetings: Not on file  . Marital Status: Not on file  Stress:   . Feeling of Stress : Not on file  Tobacco Use: Low Risk   . Smoking Tobacco Use: Never Smoker  . Smokeless Tobacco Use: Never Used  Transportation Needs: No Transportation Needs  . Lack of Transportation (Medical): No  . Lack of Transportation (Non-Medical): No      Current Diagnosis/Assessment:  Goals Addressed            This Visit's Progress   . Chronic Care Management Pharmacy Care Plan       CARE PLAN ENTRY (see  longitudinal plan of care for additional care plan information)  Current Barriers:  . Chronic Disease Management support, education, and care coordination needs related to Hypertension, Hyperlipidemia, Diabetes, AFib, Heart Failure   Hypertension BP Readings from Last 3 Encounters:  09/21/19 126/72  09/14/19 110/72  08/24/19 126/76   . Pharmacist Clinical Goal(s): o Over the next 90 days, patient will work with PharmD and providers to maintain BP goal <140/90 . Current regimen:  . Amlodipine 5mg  daily AM . Triamterene-hctz 75-50mg  daily AM . Patient self care activities - Over the next 90 days, patient will: o Maintain hypertension medication regimen.    Hyperlipidemia Lab Results  Component Value Date/Time   LDLCALC 79 09/14/2019 10:39 AM   LDLDIRECT 65.0 11/03/2018 10:38 AM   . Pharmacist Clinical Goal(s): o Over the next 90 days, patient will work with PharmD and providers to maintain LDL goal < 100 . Current regimen:  o Diet and exercise management   . Interventions: o Consider changing atorvastatin to another cholesterol medication after next lipid panel o Consider repeat lipid panel . Patient self care activities - Over the next 90 days, patient will: o Maintain LDL <100  Diabetes Lab Results  Component Value Date/Time   HGBA1C 7.7 (H) 09/14/2019 10:39 AM   HGBA1C 7.4 (H) 06/10/2019 08:27 AM   . Pharmacist Clinical Goal(s): o Over the next 90 days, patient will work with PharmD and providers to achieve A1c goal <7% . Current regimen:  . Januvia 100mg  1/2 to 1 tab daily AM . Glipizide 10mg  #2 twice daily AM and Dinner . Glucocil #2 twice daily . Interventions: o Consider PAP for Januvia or GLP1 o Requested patient to check blood sugar 2 hours after meal . Patient self care activities - Over the next 90 days, patient will: o Check blood sugar once daily, document, and provide at future appointments o Contact provider with any episodes of hypoglycemia  Afib  . Pharmacist Clinical Goal(s) o Over the next 90 days, patient will work with PharmD and providers to reduce complications associated with AFib . Current regimen:   Eliquis 2.5mg  twice daily AM and Dinner  Metoprolol succinate 25mg  daily AM . Interventions: o Complete Eliquis patient assistance paperwork . Patient self care activities - Over the next 90 days, patient will: o Maintain Afib medication regimen  Medication management . Pharmacist Clinical Goal(s): o Over the next 90 days, patient will work with PharmD and providers to achieve optimal medication adherence . [facility] . Interventions o Comprehensive medication review  performed. o Continue current medication management strategy . Patient self care activities - Over the next 90 days, patient will: o Focus on medication adherence by filling and taking medications appropriately  o Take medications as prescribed o Report any questions or concerns to PharmD and/or provider(s)  Please see past updates related to this goal by clicking on the "Past Updates" button in the selected goal         Diabetes   A1c goal <7%  FBG: 80-130 PPBG: <180  Recent Relevant Labs: Lab Results  Component Value Date/Time   HGBA1C 7.7 (H) 09/14/2019 10:39 AM   HGBA1C 7.4 (H) 06/10/2019 08:27 AM   GFR 39.93 (L) 09/14/2019 10:39 AM   GFR 36.35 (L) 08/24/2019 08:46 AM   MICROALBUR 26.7 (H) 07/15/2016 10:22 AM   MICROALBUR 1.01 07/07/2012 09:42 AM    Last diabetic Eye exam:  Lab Results  Component Value Date/Time   HMDIABEYEEXA No Retinopathy 05/26/2019 12:00 AM  Last diabetic Foot exam: No results found for: HMDIABFOOTEX   Checking BG: Daily (fasting)  Recent FBG Readings: 82 86 118 109 86 102 190 (banana pudding and ribs the night before at the [employer]) 126 133 96 134 135 156 129 Avg 120.1   Patient has failed these meds in past: metformin (renal fx) Patient is currently uncontrolled on the following medications: . Januvia 100mg  1/2 to 1 tab daily AM . Glipizide 10mg  #2 twice daily AM and Dinner . Glucocil #2 twice daily (dietary supplement)  Patient states cost of Januvia is a barrier. Can complete PAP for Januvia and/or consider completing PAP for GLP1 to replace DDP4 for greater a1c lowering.   Fasting BG at goal. Suspect PPBG are above goal.  Update 11/12/19 101 112 137 98 128 136 131 134 Avg 122.1  Discussed switch to GLP1 and patient states he is not ready to do an injection. Discussed that he could get better a1c lowering, but he states he is not ready. He states he would like to continue with his current regiment of  Januvia and glipizide because he feels his BG are remaining stable. Suspect his post prandial BG is above goal.   Discussed how due to his renal function he should probably stick with 1/2 tab, but his BG may benefit from taking a whole tab.  Could consider trying oral GLP1 (Rybelsus) and doing a PAP for this medication, but patient may be hesitant to change. Will continue to monitor and determine next steps after determining if his PPBG is above goal and/or at next a1c check.  Plan -Check BG 2 hrs after meals for PPBG assessment -Continue current medications   Future Plan -Consider switching Januvia to oral GLP1 - Rybelsus (will need to consider PAP for GLP1)   AFIB   Patient is currently rate controlled.  Patient has failed these meds in past: verapamil (junctional rhythm) Patient is currently controlled on the following medications:  Eliquis 2.5mg  twice daily AM and Dinner  Metoprolol succinate 25mg  daily AM  Patient states cost of Eliquis can be a barrier. Will complete Eliquis PAP paperwork.  Denies palpitations or bleeding.  Update 11/12/19 Patient has not received Eliquis PAP paperwork. Will send again.   Plan -Continue current medications

## 2019-11-12 NOTE — Patient Instructions (Signed)
Visit Information  Goals Addressed            This Visit's Progress    Chronic Care Management Pharmacy Care Plan       CARE PLAN ENTRY (see longitudinal plan of care for additional care plan information)  Current Barriers:   Chronic Disease Management support, education, and care coordination needs related to Hypertension, Hyperlipidemia, Diabetes, AFib, Heart Failure   Hypertension BP Readings from Last 3 Encounters:  09/21/19 126/72  09/14/19 110/72  08/24/19 126/76    Pharmacist Clinical Goal(s): o Over the next 90 days, patient will work with PharmD and providers to maintain BP goal <140/90  Current regimen:   Amlodipine 5mg  daily AM  Triamterene-hctz 75-50mg  daily AM  Patient self care activities - Over the next 90 days, patient will: o Maintain hypertension medication regimen.   Hyperlipidemia Lab Results  Component Value Date/Time   LDLCALC 79 09/14/2019 10:39 AM   LDLDIRECT 65.0 11/03/2018 10:38 AM    Pharmacist Clinical Goal(s): o Over the next 90 days, patient will work with PharmD and providers to maintain LDL goal < 100  Current regimen:  o Diet and exercise management    Interventions: o Consider changing atorvastatin to another cholesterol medication after next lipid panel o Consider repeat lipid panel  Patient self care activities - Over the next 90 days, patient will: o Maintain LDL <100  Diabetes Lab Results  Component Value Date/Time   HGBA1C 7.7 (H) 09/14/2019 10:39 AM   HGBA1C 7.4 (H) 06/10/2019 08:27 AM    Pharmacist Clinical Goal(s): o Over the next 90 days, patient will work with PharmD and providers to achieve A1c goal <7%  Current regimen:   Januvia 100mg  1/2 to 1 tab daily AM  Glipizide 10mg  #2 twice daily AM and Dinner  Glucocil #2 twice daily  Interventions: o Consider PAP for Januvia or GLP1 o Requested patient to check blood sugar 2 hours after meal  Patient self care activities - Over the next 90 days,  patient will: o Check blood sugar once daily, document, and provide at future appointments o Contact provider with any episodes of hypoglycemia  Afib   Pharmacist Clinical Goal(s) o Over the next 90 days, patient will work with PharmD and providers to reduce complications associated with AFib  Current regimen:   Eliquis 2.5mg  twice daily AM and Dinner  Metoprolol succinate 25mg  daily AM  Interventions: o Complete Eliquis patient assistance paperwork  Patient self care activities - Over the next 90 days, patient will: o Maintain Afib medication regimen  Medication management  Pharmacist Clinical Goal(s): o Over the next 90 days, patient will work with PharmD and providers to achieve optimal medication adherence  [facility]  Interventions o Comprehensive medication review performed. o Continue current medication management strategy  Patient self care activities - Over the next 90 days, patient will: o Focus on medication adherence by filling and taking medications appropriately  o Take medications as prescribed o Report any questions or concerns to PharmD and/or provider(s)  Please see past updates related to this goal by clicking on the "Past Updates" button in the selected goal         The patient verbalized understanding of instructions provided today and agreed to receive a mailed copy of patient instruction and/or educational materials.  Telephone follow up appointment with pharmacy team member scheduled for: 01/11/2020  Melvenia Beam Tatyanna Cronk, PharmD Clinical Pharmacist [facility] [phone]

## 2019-11-24 ENCOUNTER — Telehealth: Payer: Self-pay | Admitting: Emergency Medicine

## 2019-11-24 NOTE — Progress Notes (Signed)
Chronic Care Management Pharmacy Assistant   Name: Vincent Allen  MRN: [id number] DOB: 1933-08-09  Reason for Encounter: Disease State  Patient Questions:  1.  Have you seen any other providers since your last visit? No  2.  Any changes in your medicines or health? No    PCP : [name], MD   Their chronic conditions include: Hypertension, Hyperlipidemia, Diabetes, AFib, Heart Failure  No office visits, consults or hospitalizations since their last visit with the clinical pharmacist.  Allergies:   Allergies  Allergen Reactions  . Penicillins Hives and Itching    Has patient had a PCN reaction causing immediate rash, facial/tongue/throat swelling, SOB or lightheadedness with hypotension:Yes Has patient had a PCN reaction causing severe rash involving mucus membranes or skin necrosis:No Has patient had a PCN reaction that required hospitalization:No Has patient had a PCN reaction occurring within the last 10 years:No If all of the above answers are "NO", then may proceed with Cephalosporin use.   . Verapamil     Junctional rhythm  . Influenza Vaccines Rash    Medications: Outpatient Encounter Medications as of 11/24/2019  Medication Sig  . amLODipine (NORVASC) 5 MG tablet TAKE 1 TABLET BY MOUTH EVERY DAY  . atorvastatin (LIPITOR) 20 MG tablet Take 1 tablet (20 mg total) by mouth daily.  . Calcium 600 MG tablet Take 1 tablet (600 mg total) by mouth 2 (two) times daily.  Marland Kitchen ELIQUIS 2.5 MG TABS tablet TAKE 1 TABLET BY MOUTH TWICE A DAY  . furosemide (LASIX) 20 MG tablet Take 1 tablet (20 mg total) by mouth daily as needed for edema (weight gain >3lbs in 24 hrs, SOB).  Marland Kitchen glipiZIDE (GLUCOTROL) 10 MG tablet TAKE 2 TABLETS BY MOUTH TWICE A DAY BEFORE MEALS  . glucose blood (ONE TOUCH ULTRA TEST) test strip USE 1 STRIP 2 TIMES DAILY TO CHECK BLOOD SUGAR DX E08.22, N18.3  . meclizine (ANTIVERT) 25 MG tablet Take 1 tablet (25 mg total) by mouth 3 (three) times daily as  needed for dizziness. (Patient not taking: Reported on 11/04/2019)  . metoprolol succinate (TOPROL-XL) 25 MG 24 hr tablet TAKE 1 TABLET BY MOUTH EVERY DAY  . Semaglutide,0.25 or 0.5MG /DOS, (OZEMPIC, 0.25 OR 0.5 MG/DOSE,) 2 MG/1.5ML SOPN 0.25 mg SQ weekly x 4 weeks then increase to 0.5 mg weekly as needed and as directed.  . triamterene-hydrochlorothiazide (MAXZIDE) 75-50 MG tablet TAKE 1 TABLET BY MOUTH EVERY DAY   No facility-administered encounter medications on file as of 11/24/2019.    Current Diagnosis: Patient Active Problem List   Diagnosis Date Noted  . Knee pain, bilateral 09/20/2019  . Morbid obesity ([place]) 09/14/2019  . Urinary frequency 11/06/2016  . S/P placement of cardiac pacemaker 10/24/2016  . Chronic diastolic CHF (congestive heart failure) ([place]) 10/24/2016  . Cardiac device in situ   . Debility 08/15/2016  . Persistent atrial fibrillation ([place]) 07/09/2016  . Vitamin D deficiency 07/17/2015  . Preventative health care 07/17/2015  . Lumbago 12/02/2014  . Pain of toe of left foot 03/14/2014  . Diabetes mellitus type 2 in obese ([place]) 12/12/2013  . Medicare annual wellness visit, subsequent 01/17/2013  . Essential hypertension 01/13/2013  . Chronic kidney disease stage III (GFR 30-59 ml/min) 05/03/2012  . Insomnia 06/27/2011  . Osteopenia 09/02/2010  . Family history of colon cancer 09/02/2010  . Diabetes mellitus with chronic kidney disease ([place])   . Hypertensive heart disease   . Hyperlipidemia LDL goal <70   . Carotid  artery disease ([place])   . GERD (gastroesophageal reflux disease)   . CTS (carpal tunnel syndrome) 08/28/2010    Goals Addressed   None    Recent Relevant Labs: Lab Results  Component Value Date/Time   HGBA1C 7.7 (H) 09/14/2019 10:39 AM   HGBA1C 7.4 (H) 06/10/2019 08:27 AM   MICROALBUR 26.7 (H) 07/15/2016 10:22 AM   MICROALBUR 1.01 07/07/2012 09:42 AM    Kidney Function Lab Results  Component Value Date/Time   CREATININE 1.65 (H)  09/14/2019 10:39 AM   CREATININE 1.79 (H) 08/24/2019 08:46 AM   CREATININE 1.16 06/20/2014 08:18 AM   CREATININE 1.41 (H) 03/14/2014 02:01 PM   GFR 39.93 (L) 09/14/2019 10:39 AM   GFRNONAA 42 (L) 09/13/2016 02:25 PM   GFRNONAA 60 06/20/2014 08:18 AM   GFRAA 48 (L) 09/13/2016 02:25 PM   GFRAA 69 06/20/2014 08:18 AM    Left message on voicemail three times.  Patient did not call back.     Follow-Up:  Comptroller and Pharmacist Review   [place] N. [place], [place]

## 2019-11-30 ENCOUNTER — Other Ambulatory Visit: Payer: Self-pay | Admitting: Family Medicine

## 2019-11-30 ENCOUNTER — Telehealth: Payer: Self-pay | Admitting: Pharmacist

## 2019-11-30 NOTE — Progress Notes (Addendum)
Chronic Care Management Pharmacy Assistant   Name: Vincent Allen  MRN: [id number] DOB: 1934/02/15  Reason for Encounter: Disease State  Patient Questions:  1.  Have you seen any other providers since your last visit? No  2.  Any changes in your medicines or health? No    PCP : Vincent Lukes, MD   Their chronic conditions include: Hypertension, Hyperlipidemia, Diabetes, AFib, Heart Failure  No Office visits, Hospitalizations, or Consults since last CCM visit in 11/12/2019.  Allergies:   Allergies  Allergen Reactions  . Penicillins Hives and Itching    Has patient had a PCN reaction causing immediate rash, facial/tongue/throat swelling, SOB or lightheadedness with hypotension:Yes Has patient had a PCN reaction causing severe rash involving mucus membranes or skin necrosis:No Has patient had a PCN reaction that required hospitalization:No Has patient had a PCN reaction occurring within the last 10 years:No If all of the above answers are "NO", then may proceed with Cephalosporin use.   . Verapamil     Junctional rhythm  . Influenza Vaccines Rash    Medications: Outpatient Encounter Medications as of 11/30/2019  Medication Sig  . amLODipine (NORVASC) 5 MG tablet TAKE 1 TABLET BY MOUTH EVERY DAY  . atorvastatin (LIPITOR) 20 MG tablet Take 1 tablet (20 mg total) by mouth daily.  . Calcium 600 MG tablet Take 1 tablet (600 mg total) by mouth 2 (two) times daily.  Marland Kitchen ELIQUIS 2.5 MG TABS tablet TAKE 1 TABLET BY MOUTH TWICE A DAY  . furosemide (LASIX) 20 MG tablet Take 1 tablet (20 mg total) by mouth daily as needed for edema (weight gain >3lbs in 24 hrs, SOB).  Marland Kitchen glipiZIDE (GLUCOTROL) 10 MG tablet TAKE 2 TABLETS BY MOUTH TWICE A DAY BEFORE MEALS  . glucose blood (ONE TOUCH ULTRA TEST) test strip USE 1 STRIP 2 TIMES DAILY TO CHECK BLOOD SUGAR DX E08.22, N18.3  . meclizine (ANTIVERT) 25 MG tablet Take 1 tablet (25 mg total) by mouth 3 (three) times daily as needed for  dizziness. (Patient not taking: Reported on 11/04/2019)  . metoprolol succinate (TOPROL-XL) 25 MG 24 hr tablet TAKE 1 TABLET BY MOUTH EVERY DAY  . Semaglutide,0.25 or 0.5MG /DOS, (OZEMPIC, 0.25 OR 0.5 MG/DOSE,) 2 MG/1.5ML SOPN 0.25 mg SQ weekly x 4 weeks then increase to 0.5 mg weekly as needed and as directed.  . triamterene-hydrochlorothiazide (MAXZIDE) 75-50 MG tablet TAKE 1 TABLET BY MOUTH EVERY DAY   No facility-administered encounter medications on file as of 11/30/2019.    Current Diagnosis: Patient Active Problem List   Diagnosis Date Noted  . Knee pain, bilateral 09/20/2019  . Morbid obesity ([place]) 09/14/2019  . Urinary frequency 11/06/2016  . S/P placement of cardiac pacemaker 10/24/2016  . Chronic diastolic CHF (congestive heart failure) ([place]) 10/24/2016  . Cardiac device in situ   . Debility 08/15/2016  . Persistent atrial fibrillation ([place]) 07/09/2016  . Vitamin D deficiency 07/17/2015  . Preventative health care 07/17/2015  . Lumbago 12/02/2014  . Pain of toe of left foot 03/14/2014  . Diabetes mellitus type 2 in obese ([place]) 12/12/2013  . Medicare annual wellness visit, subsequent 01/17/2013  . Essential hypertension 01/13/2013  . Chronic kidney disease stage III (GFR 30-59 ml/min) 05/03/2012  . Insomnia 06/27/2011  . Osteopenia 09/02/2010  . Family history of colon cancer 09/02/2010  . Diabetes mellitus with chronic kidney disease ([place])   . Hypertensive heart disease   . Hyperlipidemia LDL goal <70   . Carotid artery disease (  [facility])   . GERD (gastroesophageal reflux disease)   . CTS (carpal tunnel syndrome) 08/28/2010    Goals Addressed   None    Recent Relevant Labs: Lab Results  Component Value Date/Time   HGBA1C 7.7 (H) 09/14/2019 10:39 AM   HGBA1C 7.4 (H) 06/10/2019 08:27 AM   MICROALBUR 26.7 (H) 07/15/2016 10:22 AM   MICROALBUR 1.01 07/07/2012 09:42 AM    Kidney Function Lab Results  Component Value Date/Time   CREATININE 1.65 (H) 09/14/2019 10:39 AM    CREATININE 1.79 (H) 08/24/2019 08:46 AM   CREATININE 1.16 06/20/2014 08:18 AM   CREATININE 1.41 (H) 03/14/2014 02:01 PM   GFR 39.93 (L) 09/14/2019 10:39 AM   GFRNONAA 42 (L) 09/13/2016 02:25 PM   GFRNONAA 60 06/20/2014 08:18 AM   GFRAA 48 (L) 09/13/2016 02:25 PM   GFRAA 69 06/20/2014 08:18 AM    . Current antihyperglycemic regimen:   Januvia 100mg  1/2 to 1 tab daily AM  Glipizide 10mg  #2 twice daily AM and Dinner  Glucocil #2 twice daily . What recent interventions/DTPs have been made to improve glycemic control:  ? Consider PAP for Januvia or GLP1 ? Requested patient to check blood sugar 2 hours after meal . Have there been any recent hospitalizations or ED visits since last visit with CPP? No . Patient denies hypoglycemic symptoms. . Patient denies hyperglycemic symptoms. . How often are you checking your blood sugar? twice daily . What are your blood sugars ranging?  o Before meals: 122-130 o 2 hours After meals: 200-210  . During the week, how often does your blood glucose drop below 70? Never . Are you checking your feet daily/regularly? Yes  Adherence Review: Is the patient currently on a STATIN medication? Yes  Atorvastatin 20mg  Is the patient currently on ACE/ARB medication? No Does the patient have >5 day gap between last estimated fill dates? Yes    Follow-Up:  Pharmacist Review   [place] Shannon, [facility] [place] Pharmacist Assistant [phone]  Atorvastatin not listed in dispense report. Will need to inquire about this at follow up. Reviewed by: De Blanch, PharmD Clinical Pharmacist [facility] [phone]

## 2019-12-02 ENCOUNTER — Other Ambulatory Visit: Payer: Self-pay | Admitting: Family Medicine

## 2019-12-09 ENCOUNTER — Other Ambulatory Visit: Payer: Self-pay | Admitting: Family Medicine

## 2019-12-14 ENCOUNTER — Telehealth: Payer: Self-pay | Admitting: Pharmacist

## 2019-12-14 NOTE — Progress Notes (Addendum)
Verified Adherence Gap Information. Per insurance data, the patient is 100% compliant with their diabetes medications Glipizide 10 mg tab last filled 09-13-19 for 90 day supply  and Januvia 100 mg tab last filled 09-13-19 for 90 day supply.  Fanny Skates, [place] Pharmacist Assistant [phone]  Noted patient would like to remain on Januvia. Will complete PAP for Januvia. -KUVJ Reviewed by: De Blanch, PharmD Clinical Pharmacist [facility] [phone]

## 2019-12-16 ENCOUNTER — Other Ambulatory Visit: Payer: Self-pay

## 2019-12-16 ENCOUNTER — Ambulatory Visit (INDEPENDENT_AMBULATORY_CARE_PROVIDER_SITE_OTHER): Payer: PPO | Admitting: Family Medicine

## 2019-12-16 VITALS — BP 129/72 | HR 79 | Temp 97.5°F | Resp 13 | Ht 72.0 in | Wt 230.8 lb

## 2019-12-16 DIAGNOSIS — E669 Obesity, unspecified: Secondary | ICD-10-CM

## 2019-12-16 DIAGNOSIS — E785 Hyperlipidemia, unspecified: Secondary | ICD-10-CM | POA: Diagnosis not present

## 2019-12-16 DIAGNOSIS — E559 Vitamin D deficiency, unspecified: Secondary | ICD-10-CM

## 2019-12-16 DIAGNOSIS — E0822 Diabetes mellitus due to underlying condition with diabetic chronic kidney disease: Secondary | ICD-10-CM | POA: Diagnosis not present

## 2019-12-16 DIAGNOSIS — I119 Hypertensive heart disease without heart failure: Secondary | ICD-10-CM

## 2019-12-16 DIAGNOSIS — E1169 Type 2 diabetes mellitus with other specified complication: Secondary | ICD-10-CM

## 2019-12-16 DIAGNOSIS — N183 Chronic kidney disease, stage 3 unspecified: Secondary | ICD-10-CM | POA: Diagnosis not present

## 2019-12-16 DIAGNOSIS — I10 Essential (primary) hypertension: Secondary | ICD-10-CM

## 2019-12-16 NOTE — Assessment & Plan Note (Signed)
Well controlled, no changes to meds. Encouraged heart healthy diet such as the DASH diet and exercise as tolerated.  °

## 2019-12-16 NOTE — Assessment & Plan Note (Signed)
Tolerating statin, encouraged heart healthy diet, avoid trans fats, minimize simple carbs and saturated fats. Increase exercise as tolerated 

## 2019-12-16 NOTE — Assessment & Plan Note (Signed)
Supplement and monitor 

## 2019-12-16 NOTE — Assessment & Plan Note (Signed)
Hydrate and monitor 

## 2019-12-16 NOTE — Assessment & Plan Note (Signed)
hgba1c acceptable, minimize simple carbs. Increase exercise as tolerated. Continue current meds 

## 2019-12-16 NOTE — Progress Notes (Signed)
Subjective:    Patient ID: Vincent Allen, male    DOB: 04/20/1933, 86 y.o.   MRN: [id number]  Chief Complaint  Patient presents with  . 3 month followup    HPI Patient is in today for follow up on chronic medical concerns. No recent febrile illness or hospitalizations. He has been doing well although he does acknowledge he has not been following a diabetic diet well. He stays active. Continues to decline the COVID shots. No polyuria or polydipsia. Denies CP/palp/SOB/HA/congestion/fevers/GI or GU c/o. Taking meds as prescribed  Past Medical History:  Diagnosis Date  . Arthritis   . Bradycardia   . Cataract   . Chronic kidney disease stage III (GFR 30-59 ml/min) 05/03/2012  . Colon polyps   . Complication of anesthesia    emesis  . Diabetes mellitus   . GERD (gastroesophageal reflux disease)   . HOH (hard of hearing)   . HTN (hypertension) 01/13/2013  . Hyperlipidemia   . Hypertension   . Pacemaker 12/2016  . Persistent atrial fibrillation ([place])   . Presence of permanent cardiac pacemaker 09/20/2016  . Symptomatic bradycardia   . Vitamin D deficiency 07/17/2015    Past Surgical History:  Procedure Laterality Date  . ANKLE ARTHROPLASTY    . CARDIOVERSION N/A 08/20/2016   Procedure: CARDIOVERSION;  Surgeon: Sanda Klein, MD;  Location: [facility];  Service: Cardiovascular;  Laterality: N/A;  . CATARACT EXTRACTION  2010, 2014  . CATARACT EXTRACTION  02/28/14  . CHOLECYSTECTOMY    . CHOLECYSTECTOMY N/A 05/03/2012   Procedure: LAPAROSCOPIC CHOLECYSTECTOMY WITH INTRAOPERATIVE CHOLANGIOGRAM;  Surgeon: [name], MD;  Location: [facility];  Service: General;  Laterality: N/A;  . INSERT / REPLACE / REMOVE PACEMAKER  09/20/2016  . PACEMAKER IMPLANT N/A 09/20/2016   Procedure: Pacemaker Implant;  Surgeon: Deboraha Sprang, MD;  Location: [facility];  Service: Cardiovascular;  Laterality: N/A;  . ROTATOR CUFF REPAIR    . SHOULDER SURGERY    . TONSILLECTOMY      Family  History  Problem Relation Age of Onset  . Cancer Mother        colon, breast, pancreas, skin cancer  . Heart disease Father        heart valve replaced  . Stroke Father   . Diabetes Father   . Cancer Paternal Grandmother        colon  . Obesity Son   . Heart disease Son        bradycardia    Social History   Socioeconomic History  . Marital status: Widowed    Spouse name: Not on file  . Number of children: Not on file  . Years of education: Not on file  . Highest education level: Not on file  Occupational History  . Not on file  Tobacco Use  . Smoking status: Never Smoker  . Smokeless tobacco: Never Used  Vaping Use  . Vaping Use: Never used  Substance and Sexual Activity  . Alcohol use: Yes    Alcohol/week: 1.0 standard drink    Types: 1 Glasses of wine per week  . Drug use: No  . 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.  Other Topics Concern  . Not on file  Social History Narrative  . Not on file   Social Determinants of Health   Financial Resource Strain: Medium Risk  .  Difficulty of Paying Living Expenses: Somewhat hard  Food Insecurity: No Food Insecurity  . Worried About Charity fundraiser in the Last Year: Never true  . Ran Out of Food in the Last Year: Never true  Transportation Needs: No Transportation Needs  . Lack of Transportation (Medical): No  . Lack of Transportation (Non-Medical): No  Physical Activity:   . Days of Exercise per Week: Not on file  . Minutes of Exercise per Session: Not on file  Stress:   . Feeling of Stress : Not on file  Social Connections:   . Frequency of Communication with Friends and Family: Not on file  . Frequency of Social Gatherings with Friends and Family: Not on file  . Attends Religious Services: Not on file  . Active Member of Clubs or Organizations: Not on file  . Attends Archivist Meetings: Not  on file  . Marital Status: Not on file  Intimate Partner Violence:   . Fear of Current or Ex-Partner: Not on file  . Emotionally Abused: Not on file  . Physically Abused: Not on file  . Sexually Abused: Not on file    Outpatient Medications Prior to Visit  Medication Sig Dispense Refill  . amLODipine (NORVASC) 5 MG tablet TAKE 1 TABLET BY MOUTH EVERY DAY 90 tablet 1  . Calcium 600 MG tablet Take 1 tablet (600 mg total) by mouth 2 (two) times daily. 60 tablet 0  . ELIQUIS 2.5 MG TABS tablet TAKE 1 TABLET BY MOUTH TWICE A DAY 60 tablet 5  . furosemide (LASIX) 20 MG tablet TAKE 1 TABLET BY MOUTH DAILY AS NEEDED FOR EDEMA (WEIGHT GAIN >3LBS IN 24 HRS, SOB). 30 tablet 1  . glipiZIDE (GLUCOTROL) 10 MG tablet TAKE 2 TABLETS BY MOUTH TWICE A DAY BEFORE MEALS 360 tablet 0  . glucose blood (ONE TOUCH ULTRA TEST) test strip USE 1 STRIP 2 TIMES DAILY TO CHECK BLOOD SUGAR DX E08.22, N18.3 300 each 1  . metoprolol succinate (TOPROL-XL) 25 MG 24 hr tablet TAKE 1 TABLET BY MOUTH EVERY DAY 90 tablet 0  . Semaglutide,0.25 or 0.5MG /DOS, (OZEMPIC, 0.25 OR 0.5 MG/DOSE,) 2 MG/1.5ML SOPN 0.25 mg SQ weekly x 4 weeks then increase to 0.5 mg weekly as needed and as directed. 3 mL 1  . triamterene-hydrochlorothiazide (MAXZIDE) 75-50 MG tablet TAKE 1 TABLET BY MOUTH EVERY DAY 90 tablet 1  . meclizine (ANTIVERT) 25 MG tablet Take 1 tablet (25 mg total) by mouth 3 (three) times daily as needed for dizziness. 30 tablet 0  . atorvastatin (LIPITOR) 20 MG tablet Take 1 tablet (20 mg total) by mouth daily. 90 tablet 3   No facility-administered medications prior to visit.    Allergies  Allergen Reactions  . Penicillins Hives and Itching    Has patient had a PCN reaction causing immediate rash, facial/tongue/throat swelling, SOB or lightheadedness with hypotension:Yes Has patient had a PCN reaction causing severe rash involving mucus membranes or skin necrosis:No Has patient had a PCN reaction that required  hospitalization:No Has patient had a PCN reaction occurring within the last 10 years:No If all of the above answers are "NO", then may proceed with Cephalosporin use.   . Verapamil     Junctional rhythm  . Influenza Vaccines Rash    tinnitus    Review of Systems  Constitutional: Negative for fever and malaise/fatigue.  HENT: Negative for congestion.   Eyes: Negative for blurred vision.  Respiratory: Negative for shortness of breath.   Cardiovascular:  Negative for chest pain, palpitations and leg swelling.  Gastrointestinal: Negative for abdominal pain, blood in stool and nausea.  Genitourinary: Negative for dysuria and frequency.  Musculoskeletal: Positive for joint pain. Negative for falls.  Skin: Negative for rash.  Neurological: Negative for dizziness, loss of consciousness and headaches.  Endo/Heme/Allergies: Negative for environmental allergies.  Psychiatric/Behavioral: Negative for depression. The patient is not nervous/anxious.        Objective:    Physical Exam Vitals and nursing note reviewed.  Constitutional:      General: He is not in acute distress.    Appearance: He is well-developed.  HENT:     Head: Normocephalic and atraumatic.     Nose: Nose normal.  Eyes:     General:        Right eye: No discharge.        Left eye: No discharge.  Cardiovascular:     Rate and Rhythm: Normal rate and regular rhythm.  Pulmonary:     Effort: Pulmonary effort is normal.     Breath sounds: Normal breath sounds.  Abdominal:     General: Bowel sounds are normal.     Palpations: Abdomen is soft.     Tenderness: There is no abdominal tenderness.  Musculoskeletal:     Cervical back: Normal range of motion and neck supple.  Skin:    General: Skin is warm and dry.  Neurological:     Mental Status: He is alert and oriented to person, place, and time.     BP 129/72 (BP Location: Left Arm, Patient Position: Sitting, Cuff Size: Small)   Pulse 79   Temp (!) 97.5 F (36.4  C) (Oral)   Resp 13   Ht 6' (1.829 m)   Wt 230 lb 12.8 oz (104.7 kg)   SpO2 98%   BMI 31.30 kg/m  Wt Readings from Last 3 Encounters:  12/16/19 230 lb 12.8 oz (104.7 kg)  09/21/19 232 lb (105.2 kg)  09/14/19 230 lb 6.4 oz (104.5 kg)    Diabetic Foot Exam - Simple   No data filed     Lab Results  Component Value Date   WBC 8.3 09/14/2019   HGB 14.6 09/14/2019   HCT 44.9 09/14/2019   PLT 154.0 09/14/2019   GLUCOSE 116 (H) 09/14/2019   CHOL 147 09/14/2019   TRIG 166.0 (H) 09/14/2019   HDL 34.30 (L) 09/14/2019   LDLDIRECT 65.0 11/03/2018   LDLCALC 79 09/14/2019   ALT 20 09/14/2019   AST 19 09/14/2019   NA 137 09/14/2019   K 3.8 09/14/2019   CL 100 09/14/2019   CREATININE 1.65 (H) 09/14/2019   BUN 28 (H) 09/14/2019   CO2 28 09/14/2019   TSH 3.39 09/14/2019   PSA 1.18 10/07/2012   INR 1.0 08/15/2016   HGBA1C 7.7 (H) 09/14/2019   MICROALBUR 26.7 (H) 07/15/2016    Lab Results  Component Value Date   TSH 3.39 09/14/2019   Lab Results  Component Value Date   WBC 8.3 09/14/2019   HGB 14.6 09/14/2019   HCT 44.9 09/14/2019   MCV 86.2 09/14/2019   PLT 154.0 09/14/2019   Lab Results  Component Value Date   NA 137 09/14/2019   K 3.8 09/14/2019   CO2 28 09/14/2019   GLUCOSE 116 (H) 09/14/2019   BUN 28 (H) 09/14/2019   CREATININE 1.65 (H) 09/14/2019   BILITOT 0.7 09/14/2019   ALKPHOS 44 09/14/2019   AST 19 09/14/2019   ALT 20 09/14/2019  PROT 6.5 09/14/2019   ALBUMIN 3.9 09/14/2019   CALCIUM 9.7 09/14/2019   ANIONGAP 8 07/10/2016   GFR 39.93 (L) 09/14/2019   Lab Results  Component Value Date   CHOL 147 09/14/2019   Lab Results  Component Value Date   HDL 34.30 (L) 09/14/2019   Lab Results  Component Value Date   LDLCALC 79 09/14/2019   Lab Results  Component Value Date   TRIG 166.0 (H) 09/14/2019   Lab Results  Component Value Date   CHOLHDL 4 09/14/2019   Lab Results  Component Value Date   HGBA1C 7.7 (H) 09/14/2019       Assessment  & Plan:   Problem List Items Addressed This Visit    Diabetes mellitus with chronic kidney disease ([place]) - Primary (Chronic)   Relevant Orders   Hemoglobin A1c   Hypertensive heart disease (Chronic)   Relevant Orders   CBC   Comprehensive metabolic panel   TSH   Hyperlipidemia LDL goal <70 (Chronic)    Tolerating statin, encouraged heart healthy diet, avoid trans fats, minimize simple carbs and saturated fats. Increase exercise as tolerated      Relevant Orders   Lipid panel   Chronic kidney disease stage III (GFR 30-59 ml/min) (Chronic)    Hydrate and monitor      Essential hypertension    Well controlled, no changes to meds. Encouraged heart healthy diet such as the DASH diet and exercise as tolerated.       Diabetes mellitus type 2 in obese (H[facility])    hgba1c acceptable, minimize simple carbs. Increase exercise as tolerated. Continue current meds      Vitamin D deficiency    Supplement and monitor      Relevant Orders   VITAMIN D 25 Hydroxy (Vit-D Deficiency, Fractures)      I have discontinued Jerre C. Folds "Bob"'s meclizine. I am also having him maintain his calcium carbonate, atorvastatin, glucose blood, amLODipine, Eliquis, metoprolol succinate, Ozempic (0.25 or 0.5 MG/DOSE), triamterene-hydrochlorothiazide, furosemide, and glipiZIDE.  No orders of the defined types were placed in this encounter.    [name], MD

## 2019-12-16 NOTE — Patient Instructions (Signed)

## 2019-12-17 LAB — COMPREHENSIVE METABOLIC PANEL
AG Ratio: 1.4 (calc) (ref 1.0–2.5)
ALT: 20 U/L (ref 9–46)
AST: 18 U/L (ref 10–35)
Albumin: 4 g/dL (ref 3.6–5.1)
Alkaline phosphatase (APISO): 48 U/L (ref 35–144)
BUN/Creatinine Ratio: 20 (calc) (ref 6–22)
BUN: 34 mg/dL — ABNORMAL HIGH (ref 7–25)
CO2: 24 mmol/L (ref 20–32)
Calcium: 10 mg/dL (ref 8.6–10.3)
Chloride: 101 mmol/L (ref 98–110)
Creat: 1.74 mg/dL — ABNORMAL HIGH (ref 0.70–1.11)
Globulin: 2.9 g/dL (calc) (ref 1.9–3.7)
Glucose, Bld: 93 mg/dL (ref 65–99)
Potassium: 4.1 mmol/L (ref 3.5–5.3)
Sodium: 137 mmol/L (ref 135–146)
Total Bilirubin: 0.8 mg/dL (ref 0.2–1.2)
Total Protein: 6.9 g/dL (ref 6.1–8.1)

## 2019-12-17 LAB — HEMOGLOBIN A1C
Hgb A1c MFr Bld: 7.2 % of total Hgb — ABNORMAL HIGH (ref <5.7)
Mean Plasma Glucose: 160 (calc)
eAG (mmol/L): 8.9 (calc)

## 2019-12-17 LAB — CBC
HCT: 45.9 % (ref 38.5–50.0)
Hemoglobin: 15.2 g/dL (ref 13.2–17.1)
MCH: 27.9 pg (ref 27.0–33.0)
MCHC: 33.1 g/dL (ref 32.0–36.0)
MCV: 84.4 fL (ref 80.0–100.0)
MPV: 10.6 fL (ref 7.5–12.5)
Platelets: 198 10*3/uL (ref 140–400)
RBC: 5.44 10*6/uL (ref 4.20–5.80)
RDW: 13 % (ref 11.0–15.0)
WBC: 9 10*3/uL (ref 3.8–10.8)

## 2019-12-17 LAB — VITAMIN D 25 HYDROXY (VIT D DEFICIENCY, FRACTURES): Vit D, 25-Hydroxy: 63 ng/mL (ref 30–100)

## 2019-12-17 LAB — LIPID PANEL
Cholesterol: 163 mg/dL (ref <200)
HDL: 32 mg/dL — ABNORMAL LOW (ref >=40)
LDL Cholesterol (Calc): 101 mg/dL (calc) — ABNORMAL HIGH
Non-HDL Cholesterol (Calc): 131 mg/dL (calc) — ABNORMAL HIGH (ref <130)
Total CHOL/HDL Ratio: 5.1 (calc) — ABNORMAL HIGH (ref <5.0)
Triglycerides: 178 mg/dL — ABNORMAL HIGH (ref <150)

## 2019-12-17 LAB — TSH: TSH: 4.49 mIU/L (ref 0.40–4.50)

## 2019-12-17 NOTE — Progress Notes (Signed)
LMOM to return call.

## 2019-12-18 ENCOUNTER — Other Ambulatory Visit: Payer: Self-pay | Admitting: Family Medicine

## 2019-12-29 ENCOUNTER — Ambulatory Visit (INDEPENDENT_AMBULATORY_CARE_PROVIDER_SITE_OTHER): Payer: PPO

## 2019-12-29 DIAGNOSIS — I4819 Other persistent atrial fibrillation: Secondary | ICD-10-CM

## 2019-12-29 LAB — CUP PACEART REMOTE DEVICE CHECK
Battery Remaining Longevity: 108 mo
Battery Voltage: 2.99 V
Brady Statistic RA Percent Paced: 0.62 %
Brady Statistic RV Percent Paced: 99.78 %
Date Time Interrogation Session: 20211011211222
Implantable Lead Implant Date: 20180706
Implantable Lead Implant Date: 20180706
Implantable Lead Location: 753859
Implantable Lead Location: 753860
Implantable Lead Model: 5076
Implantable Lead Model: 5076
Implantable Pulse Generator Implant Date: 20180706
Lead Channel Impedance Value: 323 Ohm
Lead Channel Impedance Value: 494 Ohm
Lead Channel Impedance Value: 532 Ohm
Lead Channel Impedance Value: 551 Ohm
Lead Channel Pacing Threshold Amplitude: 0.625 V
Lead Channel Pacing Threshold Pulse Width: 0.4 ms
Lead Channel Sensing Intrinsic Amplitude: 0.875 mV
Lead Channel Sensing Intrinsic Amplitude: 0.875 mV
Lead Channel Sensing Intrinsic Amplitude: 11 mV
Lead Channel Sensing Intrinsic Amplitude: 11 mV
Lead Channel Setting Pacing Amplitude: 2 V
Lead Channel Setting Pacing Amplitude: 2.5 V
Lead Channel Setting Pacing Pulse Width: 0.4 ms
Lead Channel Setting Sensing Sensitivity: 2.8 mV

## 2019-12-30 ENCOUNTER — Other Ambulatory Visit: Payer: Self-pay | Admitting: Family Medicine

## 2019-12-31 ENCOUNTER — Other Ambulatory Visit: Payer: Self-pay | Admitting: Family Medicine

## 2019-12-31 DIAGNOSIS — I4819 Other persistent atrial fibrillation: Secondary | ICD-10-CM

## 2020-01-03 ENCOUNTER — Other Ambulatory Visit: Payer: Self-pay

## 2020-01-03 ENCOUNTER — Other Ambulatory Visit: Payer: Self-pay | Admitting: Family Medicine

## 2020-01-03 MED ORDER — AMLODIPINE BESYLATE 5 MG PO TABS
5.0000 mg | ORAL_TABLET | Freq: Every day | ORAL | 1 refills | Status: DC
Start: 2020-01-03 — End: 2020-06-27

## 2020-01-03 NOTE — Progress Notes (Signed)
Remote pacemaker transmission.   

## 2020-01-11 ENCOUNTER — Ambulatory Visit: Payer: PPO | Admitting: Pharmacist

## 2020-01-11 DIAGNOSIS — E0822 Diabetes mellitus due to underlying condition with diabetic chronic kidney disease: Secondary | ICD-10-CM

## 2020-01-11 DIAGNOSIS — I4819 Other persistent atrial fibrillation: Secondary | ICD-10-CM

## 2020-01-11 NOTE — Chronic Care Management (AMB) (Signed)
Chronic Care Management Pharmacy  Name: Vincent Allen  MRN: [id number] DOB: 12/11/1933  Chief Complaint/ HPI  Vincent Allen,  86 y.o. , male presents for their Follow-Up CCM visit with the clinical pharmacist via telephone due to COVID-19 Pandemic.  PCP : [name], MD  Their chronic conditions include: Hypertension, Hyperlipidemia, Diabetes, AFib, Heart Failure  Office Visits: 12/16/19: Visit w/ Dr. [name] -  Labs ordered. No med changes noted.  MD note: Notify sugar is better but creatinine (Kidney marker) is off slightly more. Make sure to drink 60-80 ounces of fluids daily and avoid taking over the counter meds without checking with [place]. We can either refer to nephrology (kidney doctor) for consultation or recheck cmp in 4-6 weeks at patient discretion.  Consult Visit: None since last CCM visit on 11/12/19.  Medications: Outpatient Encounter Medications as of 01/11/2020  Medication Sig  . amLODipine (NORVASC) 5 MG tablet Take 1 tablet (5 mg total) by mouth daily.  Marland Kitchen atorvastatin (LIPITOR) 20 MG tablet Take 1 tablet (20 mg total) by mouth daily.  . Calcium 600 MG tablet Take 1 tablet (600 mg total) by mouth 2 (two) times daily.  Marland Kitchen ELIQUIS 2.5 MG TABS tablet TAKE 1 TABLET BY MOUTH TWICE A Destiny Hagin  . furosemide (LASIX) 20 MG tablet TAKE 1 TABLET BY MOUTH DAILY AS NEEDED FOR EDEMA (WEIGHT GAIN >3LBS IN 24 HRS, SOB).  Marland Kitchen glipiZIDE (GLUCOTROL) 10 MG tablet TAKE 2 TABLETS BY MOUTH TWICE A Kailea Dannemiller BEFORE MEALS  . glucose blood (ONE TOUCH ULTRA TEST) test strip USE 1 STRIP 2 TIMES DAILY TO CHECK BLOOD SUGAR DX E08.22, N18.3  . metoprolol succinate (TOPROL-XL) 25 MG 24 hr tablet TAKE 1 TABLET BY MOUTH EVERY Shalika Arntz  . Semaglutide,0.25 or 0.5MG /DOS, (OZEMPIC, 0.25 OR 0.5 MG/DOSE,) 2 MG/1.5ML SOPN 0.25 mg SQ weekly x 4 weeks then increase to 0.5 mg weekly as needed and as directed.  . triamterene-hydrochlorothiazide (MAXZIDE) 75-50 MG tablet TAKE 1 TABLET BY MOUTH EVERY Unique Sillas   No  facility-administered encounter medications on file as of 01/11/2020.   SDOH Screenings   Alcohol Screen:   . Last Alcohol Screening Score (AUDIT): Not on file  Depression (PHQ2-9): Low Risk   . PHQ-2 Score: 0  Financial Resource Strain: Medium Risk  . Difficulty of Paying Living Expenses: Somewhat hard  Food Insecurity: No Food Insecurity  . Worried About Charity fundraiser in the Last Year: Never true  . Ran Out of Food in the Last Year: Never true  Housing: Low Risk   . Last Housing Risk Score: 0  Physical Activity:   . Days of Exercise per Week: Not on file  . Minutes of Exercise per Session: Not on file  Social Connections:   . Frequency of Communication with Friends and Family: Not on file  . Frequency of Social Gatherings with Friends and Family: Not on file  . Attends Religious Services: Not on file  . Active Member of Clubs or Organizations: Not on file  . Attends Archivist Meetings: Not on file  . Marital Status: Not on file  Stress:   . Feeling of Stress : Not on file  Tobacco Use: Low Risk   . Smoking Tobacco Use: Never Smoker  . Smokeless Tobacco Use: Never Used  Transportation Needs: No Transportation Needs  . Lack of Transportation (Medical): No  . Lack of Transportation (Non-Medical): No      Current Diagnosis/Assessment:  Goals Addressed  This Visit's Progress   . Chronic Care Management Pharmacy Care Plan       CARE PLAN ENTRY (see longitudinal plan of care for additional care plan information)  Current Barriers:  . Chronic Disease Management support, education, and care coordination needs related to Hypertension, Hyperlipidemia, Diabetes, AFib, Heart Failure   Hypertension BP Readings from Last 3 Encounters:  12/16/19 129/72  09/21/19 126/72  09/14/19 110/72   . Pharmacist Clinical Goal(s): o Over the next 90 days, patient will work with PharmD and providers to maintain BP goal <140/90 . Current regimen:   . Amlodipine 5mg  daily AM . Triamterene-hctz 75-50mg  daily AM . Patient self care activities - Over the next 90 days, patient will: o Maintain hypertension medication regimen.   Hyperlipidemia Lab Results  Component Value Date/Time   LDLCALC 101 (H) 12/16/2019 03:43 PM   LDLDIRECT 65.0 11/03/2018 10:38 AM   . Pharmacist Clinical Goal(s): o Over the next 90 days, patient will work with PharmD and providers to achieve LDL goal < 100 . Current regimen:  o Diet and exercise management   . Interventions: o Consider changing atorvastatin to another cholesterol medication after next lipid panel o Consider repeat lipid panel . Patient self care activities - Over the next 90 days, patient will: o Achieve LDL <100  Diabetes Lab Results  Component Value Date/Time   HGBA1C 7.2 (H) 12/16/2019 03:43 PM   HGBA1C 7.7 (H) 09/14/2019 10:39 AM   . Pharmacist Clinical Goal(s): o Over the next 90 days, patient will work with PharmD and providers to achieve A1c goal <7% . Current regimen:  . Januvia 100mg  1 tab daily AM . Glipizide 10mg  #2 twice daily AM and Dinner . Glucocil #2 twice daily . Interventions: o Consider PAP for Januvia or GLP1 o Requested patient to check blood sugar 2 hours after meal . Patient self care activities - Over the next 90 days, patient will: o Check blood sugar once daily, document, and provide at future appointments o Contact provider with any episodes of hypoglycemia  Afib  . Pharmacist Clinical Goal(s) o Over the next 90 days, patient will work with PharmD and providers to reduce complications associated with AFib . Current regimen:   Eliquis 2.5mg  twice daily AM and Dinner  Metoprolol succinate 25mg  daily AM . Interventions: o Complete Eliquis patient assistance paperwork (completed) . Patient self care activities - Over the next 90 days, patient will: o Maintain Afib medication regimen  Medication management . Pharmacist Clinical Goal(s): o Over the  next 90 days, patient will work with PharmD and providers to achieve optimal medication adherence . Current [facility] . Interventions o Comprehensive medication review performed. o Continue current medication management strategy . Patient self care activities - Over the next 90 days, patient will: o Focus on medication adherence by filling and taking medications appropriately  o Take medications as prescribed o Report any questions or concerns to PharmD and/or provider(s)  Please see past updates related to this goal by clicking on the "Past Updates" button in the selected goal         Diabetes   A1c goal <7%  FBG: 80-130 PPBG: <180  Recent Relevant Labs: Lab Results  Component Value Date/Time   HGBA1C 7.2 (H) 12/16/2019 03:43 PM   HGBA1C 7.7 (H) 09/14/2019 10:39 AM   GFR 39.93 (L) 09/14/2019 10:39 AM   GFR 36.35 (L) 08/24/2019 08:46 AM   MICROALBUR 26.7 (H) 07/15/2016 10:22 AM   MICROALBUR 1.01 07/07/2012 09:42 AM  Last diabetic Eye exam:  Lab Results  Component Value Date/Time   HMDIABEYEEXA No Retinopathy 05/26/2019 12:00 AM    Last diabetic Foot exam: No results found for: HMDIABFOOTEX   Checking BG: Daily (fasting)  Recent FBG Readings: 82 86 118 109 86 102 190 (banana pudding and ribs the night before at the [employer]) 126 133 96 134 135 156 129 Avg 120.1   Patient has failed these meds in past: metformin (renal fx) Patient is currently uncontrolled, but trending down on the following medications: . Januvia 100mg  1 tab daily AM . Glipizide 10mg  #2 twice daily AM and Dinner . Glucocil #2 twice daily (dietary supplement)  Patient states cost of Januvia is a barrier. Can complete PAP for Januvia and/or consider completing PAP for GLP1 to replace DDP4 for greater a1c lowering.   Fasting BG at goal. Suspect PPBG are above goal.  Update 11/12/19 101 112 137 98 128 136 131 134 Avg 122.1  Discussed switch to GLP1 and patient  states he is not ready to do an injection. Discussed that he could get better a1c lowering, but he states he is not ready. He states he would like to continue with his current regiment of Januvia and glipizide because he feels his BG are remaining stable. Suspect his post prandial BG is above goal.   Discussed how due to his renal function he should probably stick with 1/2 tab, but his BG may benefit from taking a whole tab.  Could consider trying oral GLP1 (Rybelsus) and doing a PAP for this medication, but patient may be hesitant to change. Will continue to monitor and determine next steps after determining if his PPBG is above goal and/or at next a1c check.  Update 01/11/20 A1c trending down. Will continue with current regimen for now. If next a1c >7% will consider completing paperwork for Rybelsus.  He has "cake fits" 111 this AM Highest: 180 (barbeque rib Gaylen Venning) Lowest: 98 (none less than 90)  Discussed request Dr. [name] had for patient to follow up with CMP or see nephrology. Pt would like to schedule labs. Will help coordinate lab appt.  Plan -Check BG 2 hrs after meals for PPBG assessment -Continue current medications  -Complete lab visit to further assess kidney function  Future Plan -Consider switching Januvia to oral GLP1 - Rybelsus (will need to consider PAP for GLP1)   AFIB   Patient is currently rate controlled.  Patient has failed these meds in past: verapamil (junctional rhythm) Patient is currently controlled on the following medications:  Eliquis 2.5mg  twice daily AM and Dinner  Metoprolol succinate 25mg  daily AM  Patient states cost of Eliquis can be a barrier. Will complete Eliquis PAP paperwork.  Denies palpitations or bleeding.  Update 11/12/19 Patient has not received Eliquis PAP paperwork. Will send again.   Update 01/11/20 Received PAP paperwork from patient. Received signature from Dr. [name] and faxed PAP application. He is paying $125/30DS of  Eliquis.  Plan -Continue current medications  -Follow up on PAP status  De Blanch, PharmD Clinical Pharmacist [facility] at [facility] [phone]

## 2020-01-11 NOTE — Patient Instructions (Signed)
Visit Information  Goals Addressed            This Visit's Progress   . Chronic Care Management Pharmacy Care Plan       CARE PLAN ENTRY (see longitudinal plan of care for additional care plan information)  Current Barriers:  . Chronic Disease Management support, education, and care coordination needs related to Hypertension, Hyperlipidemia, Diabetes, AFib, Heart Failure   Hypertension BP Readings from Last 3 Encounters:  12/16/19 129/72  09/21/19 126/72  09/14/19 110/72   . Pharmacist Clinical Goal(s): o Over the next 90 days, patient will work with PharmD and providers to maintain BP goal <140/90 . Current regimen:  . Amlodipine 5mg  daily AM . Triamterene-hctz 75-50mg  daily AM . Patient self care activities - Over the next 90 days, patient will: o Maintain hypertension medication regimen.   Hyperlipidemia Lab Results  Component Value Date/Time   LDLCALC 101 (H) 12/16/2019 03:43 PM   LDLDIRECT 65.0 11/03/2018 10:38 AM   . Pharmacist Clinical Goal(s): o Over the next 90 days, patient will work with PharmD and providers to achieve LDL goal < 100 . Current regimen:  o Diet and exercise management   . Interventions: o Consider changing atorvastatin to another cholesterol medication after next lipid panel o Consider repeat lipid panel . Patient self care activities - Over the next 90 days, patient will: o Achieve LDL <100  Diabetes Lab Results  Component Value Date/Time   HGBA1C 7.2 (H) 12/16/2019 03:43 PM   HGBA1C 7.7 (H) 09/14/2019 10:39 AM   . Pharmacist Clinical Goal(s): o Over the next 90 days, patient will work with PharmD and providers to achieve A1c goal <7% . Current regimen:  . Januvia 100mg  1 tab daily AM . Glipizide 10mg  #2 twice daily AM and Dinner . Glucocil #2 twice daily . Interventions: o Consider PAP for Januvia or GLP1 o Requested patient to check blood sugar 2 hours after meal . Patient self care activities - Over the next 90 days, patient  will: o Check blood sugar once daily, document, and provide at future appointments o Contact provider with any episodes of hypoglycemia  Afib  . Pharmacist Clinical Goal(s) o Over the next 90 days, patient will work with PharmD and providers to reduce complications associated with AFib . Current regimen:   Eliquis 2.5mg  twice daily AM and Dinner  Metoprolol succinate 25mg  daily AM . Interventions: o Complete Eliquis patient assistance paperwork (completed) . Patient self care activities - Over the next 90 days, patient will: o Maintain Afib medication regimen  Medication management . Pharmacist Clinical Goal(s): o Over the next 90 days, patient will work with PharmD and providers to achieve optimal medication adherence . [facility] . Interventions o Comprehensive medication review performed. o Continue current medication management strategy . Patient self care activities - Over the next 90 days, patient will: o Focus on medication adherence by filling and taking medications appropriately  o Take medications as prescribed o Report any questions or concerns to PharmD and/or provider(s)  Please see past updates related to this goal by clicking on the "Past Updates" button in the selected goal         Patient verbalizes understanding of instructions provided today.   Telephone follow up appointment with pharmacy team member scheduled for: 04/11/2020  Melvenia Beam Reinette Cuneo, PharmD Clinical Pharmacist [facility] at [facility] [phone]

## 2020-01-24 ENCOUNTER — Telehealth: Payer: Self-pay | Admitting: Pharmacist

## 2020-01-24 NOTE — Progress Notes (Addendum)
Chronic Care Management Pharmacy Assistant   Name: HOMERO HYSON  MRN: [id number] DOB: 17-May-1933  Reason for Encounter: Patient Assistance Coordination  PCP : [name], MD  Allergies:   Allergies  Allergen Reactions  . Penicillins Hives and Itching    Has patient had a PCN reaction causing immediate rash, facial/tongue/throat swelling, SOB or lightheadedness with hypotension:Yes Has patient had a PCN reaction causing severe rash involving mucus membranes or skin necrosis:No Has patient had a PCN reaction that required hospitalization:No Has patient had a PCN reaction occurring within the last 10 years:No If all of the above answers are "NO", then may proceed with Cephalosporin use.   . Verapamil     Junctional rhythm  . Influenza Vaccines Rash    tinnitus    Medications: Outpatient Encounter Medications as of 01/24/2020  Medication Sig  . amLODipine (NORVASC) 5 MG tablet Take 1 tablet (5 mg total) by mouth daily.  Marland Kitchen atorvastatin (LIPITOR) 20 MG tablet Take 1 tablet (20 mg total) by mouth daily.  . Calcium 600 MG tablet Take 1 tablet (600 mg total) by mouth 2 (two) times daily.  Marland Kitchen ELIQUIS 2.5 MG TABS tablet TAKE 1 TABLET BY MOUTH TWICE A DAY  . furosemide (LASIX) 20 MG tablet TAKE 1 TABLET BY MOUTH DAILY AS NEEDED FOR EDEMA (WEIGHT GAIN >3LBS IN 24 HRS, SOB).  Marland Kitchen glipiZIDE (GLUCOTROL) 10 MG tablet TAKE 2 TABLETS BY MOUTH TWICE A DAY BEFORE MEALS  . glucose blood (ONE TOUCH ULTRA TEST) test strip USE 1 STRIP 2 TIMES DAILY TO CHECK BLOOD SUGAR DX E08.22, N18.3  . metoprolol succinate (TOPROL-XL) 25 MG 24 hr tablet TAKE 1 TABLET BY MOUTH EVERY DAY  . Semaglutide,0.25 or 0.5MG /DOS, (OZEMPIC, 0.25 OR 0.5 MG/DOSE,) 2 MG/1.5ML SOPN 0.25 mg SQ weekly x 4 weeks then increase to 0.5 mg weekly as needed and as directed.  . triamterene-hydrochlorothiazide (MAXZIDE) 75-50 MG tablet TAKE 1 TABLET BY MOUTH EVERY DAY   No facility-administered encounter medications on file as of  01/24/2020.    Current Diagnosis: Patient Active Problem List   Diagnosis Date Noted  . Knee pain, bilateral 09/20/2019  . Morbid obesity ([place]) 09/14/2019  . Urinary frequency 11/06/2016  . S/P placement of cardiac pacemaker 10/24/2016  . Chronic diastolic CHF (congestive heart failure) ([place]) 10/24/2016  . Cardiac device in situ   . Debility 08/15/2016  . Persistent atrial fibrillation ([place]) 07/09/2016  . Vitamin D deficiency 07/17/2015  . Preventative health care 07/17/2015  . Lumbago 12/02/2014  . Pain of toe of left foot 03/14/2014  . Diabetes mellitus type 2 in obese ([place]) 12/12/2013  . Medicare annual wellness visit, subsequent 01/17/2013  . Essential hypertension 01/13/2013  . Chronic kidney disease stage III (GFR 30-59 ml/min) 05/03/2012  . Insomnia 06/27/2011  . Osteopenia 09/02/2010  . Family history of colon cancer 09/02/2010  . Diabetes mellitus with chronic kidney disease ([place])   . Hypertensive heart disease   . Hyperlipidemia LDL goal <70   . Carotid artery disease ([place])   . GERD (gastroesophageal reflux disease)   . CTS (carpal tunnel syndrome) 08/28/2010    Goals Addressed   None    01-24-20 Spoke with representative of [employer] informed me patient is approved for Eliquis from January 22, 2020-March 17, 2020.  Reported patient needs to call and scheduled shipment.  [phone]  01-25-20 Patient is aware via voicemail   Follow-Up:  Patient Assistance Coordination   [place] Shannon, [place]  [facility] at [place] [phone]  Received fax of approval to PCP office. Placed in batch to be scanned to patient's chart. -APTC Reviewed by: De Blanch, PharmD Clinical Pharmacist [facility] at [facility] [phone]

## 2020-01-26 ENCOUNTER — Telehealth: Payer: Self-pay | Admitting: Pharmacist

## 2020-01-26 NOTE — Progress Notes (Addendum)
Chronic Care Management Pharmacy Assistant   Name: Vincent Allen  MRN: [id number] DOB: Dec 22, 1933  Reason for Encounter: Medication Review   PCP : [name], MD  Allergies:   Allergies  Allergen Reactions  . Penicillins Hives and Itching    Has patient had a PCN reaction causing immediate rash, facial/tongue/throat swelling, SOB or lightheadedness with hypotension:Yes Has patient had a PCN reaction causing severe rash involving mucus membranes or skin necrosis:No Has patient had a PCN reaction that required hospitalization:No Has patient had a PCN reaction occurring within the last 10 years:No If all of the above answers are "NO", then may proceed with Cephalosporin use.   . Verapamil     Junctional rhythm  . Influenza Vaccines Rash    tinnitus    Medications: Outpatient Encounter Medications as of 01/26/2020  Medication Sig  . amLODipine (NORVASC) 5 MG tablet Take 1 tablet (5 mg total) by mouth daily.  Marland Kitchen atorvastatin (LIPITOR) 20 MG tablet Take 1 tablet (20 mg total) by mouth daily.  . Calcium 600 MG tablet Take 1 tablet (600 mg total) by mouth 2 (two) times daily.  Marland Kitchen ELIQUIS 2.5 MG TABS tablet TAKE 1 TABLET BY MOUTH TWICE A DAY  . furosemide (LASIX) 20 MG tablet TAKE 1 TABLET BY MOUTH DAILY AS NEEDED FOR EDEMA (WEIGHT GAIN >3LBS IN 24 HRS, SOB).  Marland Kitchen glipiZIDE (GLUCOTROL) 10 MG tablet TAKE 2 TABLETS BY MOUTH TWICE A DAY BEFORE MEALS  . glucose blood (ONE TOUCH ULTRA TEST) test strip USE 1 STRIP 2 TIMES DAILY TO CHECK BLOOD SUGAR DX E08.22, N18.3  . metoprolol succinate (TOPROL-XL) 25 MG 24 hr tablet TAKE 1 TABLET BY MOUTH EVERY DAY  . Semaglutide,0.25 or 0.5MG /DOS, (OZEMPIC, 0.25 OR 0.5 MG/DOSE,) 2 MG/1.5ML SOPN 0.25 mg SQ weekly x 4 weeks then increase to 0.5 mg weekly as needed and as directed.  . triamterene-hydrochlorothiazide (MAXZIDE) 75-50 MG tablet TAKE 1 TABLET BY MOUTH EVERY DAY   No facility-administered encounter medications on file as of 01/26/2020.      Current Diagnosis: Patient Active Problem List   Diagnosis Date Noted  . Knee pain, bilateral 09/20/2019  . Morbid obesity ([place]) 09/14/2019  . Urinary frequency 11/06/2016  . S/P placement of cardiac pacemaker 10/24/2016  . Chronic diastolic CHF (congestive heart failure) ([place]) 10/24/2016  . Cardiac device in situ   . Debility 08/15/2016  . Persistent atrial fibrillation ([place]) 07/09/2016  . Vitamin D deficiency 07/17/2015  . Preventative health care 07/17/2015  . Lumbago 12/02/2014  . Pain of toe of left foot 03/14/2014  . Diabetes mellitus type 2 in obese ([place]) 12/12/2013  . Medicare annual wellness visit, subsequent 01/17/2013  . Essential hypertension 01/13/2013  . Chronic kidney disease stage III (GFR 30-59 ml/min) 05/03/2012  . Insomnia 06/27/2011  . Osteopenia 09/02/2010  . Family history of colon cancer 09/02/2010  . Diabetes mellitus with chronic kidney disease ([place])   . Hypertensive heart disease   . Hyperlipidemia LDL goal <70   . Carotid artery disease ([place])   . GERD (gastroesophageal reflux disease)   . CTS (carpal tunnel syndrome) 08/28/2010    Goals Addressed   None    Patient called to get the number for his Eliquis assistance program to get a shipment scheduled to be delivered to his home. [phone].   Follow-Up:  Pharmacist Review   [place] Shannon, [place] [facility] [place] Pharmacist Assistant [phone]  Reviewed by: De Blanch, PharmD Clinical  Pharmacist [place] at [facility] [phone]

## 2020-01-31 ENCOUNTER — Ambulatory Visit: Payer: PPO | Admitting: Family Medicine

## 2020-02-01 ENCOUNTER — Other Ambulatory Visit: Payer: Self-pay

## 2020-02-01 ENCOUNTER — Telehealth: Payer: Self-pay | Admitting: *Deleted

## 2020-02-01 DIAGNOSIS — I10 Essential (primary) hypertension: Secondary | ICD-10-CM

## 2020-02-01 NOTE — Telephone Encounter (Signed)
Future lab for CMP placed in chart

## 2020-02-01 NOTE — Telephone Encounter (Signed)
Pt has lab appt on 02/03/20 but I do not see any future orders in Epic. Please place orders if appropriate.

## 2020-02-01 NOTE — Telephone Encounter (Signed)
Thank you :)

## 2020-02-02 ENCOUNTER — Other Ambulatory Visit: Payer: Self-pay

## 2020-02-02 ENCOUNTER — Encounter: Payer: Self-pay | Admitting: Podiatry

## 2020-02-02 ENCOUNTER — Ambulatory Visit: Payer: PPO | Admitting: Podiatry

## 2020-02-02 DIAGNOSIS — B351 Tinea unguium: Secondary | ICD-10-CM | POA: Diagnosis not present

## 2020-02-02 DIAGNOSIS — M79674 Pain in right toe(s): Secondary | ICD-10-CM

## 2020-02-02 DIAGNOSIS — L600 Ingrowing nail: Secondary | ICD-10-CM | POA: Diagnosis not present

## 2020-02-02 DIAGNOSIS — M79675 Pain in left toe(s): Secondary | ICD-10-CM

## 2020-02-02 DIAGNOSIS — E119 Type 2 diabetes mellitus without complications: Secondary | ICD-10-CM

## 2020-02-02 DIAGNOSIS — E0822 Diabetes mellitus due to underlying condition with diabetic chronic kidney disease: Secondary | ICD-10-CM | POA: Diagnosis not present

## 2020-02-02 DIAGNOSIS — N183 Chronic kidney disease, stage 3 unspecified: Secondary | ICD-10-CM

## 2020-02-03 ENCOUNTER — Other Ambulatory Visit (INDEPENDENT_AMBULATORY_CARE_PROVIDER_SITE_OTHER): Payer: PPO

## 2020-02-03 DIAGNOSIS — I10 Essential (primary) hypertension: Secondary | ICD-10-CM | POA: Diagnosis not present

## 2020-02-03 LAB — COMPREHENSIVE METABOLIC PANEL
ALT: 18 U/L (ref 0–53)
AST: 18 U/L (ref 0–37)
Albumin: 3.9 g/dL (ref 3.5–5.2)
Alkaline Phosphatase: 54 U/L (ref 39–117)
BUN: 32 mg/dL — ABNORMAL HIGH (ref 6–23)
CO2: 26 mEq/L (ref 19–32)
Calcium: 9.4 mg/dL (ref 8.4–10.5)
Chloride: 103 mEq/L (ref 96–112)
Creatinine, Ser: 1.6 mg/dL — ABNORMAL HIGH (ref 0.40–1.50)
GFR: 39.32 mL/min — ABNORMAL LOW (ref >60.00)
Glucose, Bld: 136 mg/dL — ABNORMAL HIGH (ref 70–99)
Potassium: 4 mEq/L (ref 3.5–5.1)
Sodium: 138 mEq/L (ref 135–145)
Total Bilirubin: 1.2 mg/dL (ref 0.2–1.2)
Total Protein: 6.8 g/dL (ref 6.0–8.3)

## 2020-02-05 NOTE — Progress Notes (Signed)
ANNUAL DIABETIC FOOT EXAM  Subjective: Vincent Allen presents today for for annual diabetic foot examination and painful thick toenails that are difficult to trim. Pain interferes with ambulation. Aggravating factors include wearing enclosed shoe gear. Pain is relieved with periodic professional debridement..  Patient relates 25 year h/o diabetes.  Patient denies any h/o foot wounds.  Patient denies symptoms of foot numbness.  Patient denies symptoms of foot tingling.  Patient denies symptoms of burning in feet.  Patient's blood sugar was 127 mg/dl this morning.   [name], MD is patient's PCP. Last visit was 12/16/2019.  Past Medical History:  Diagnosis Date  . Arthritis   . Bradycardia   . Cataract   . Chronic kidney disease stage III (GFR 30-59 ml/min) 05/03/2012  . Colon polyps   . Complication of anesthesia    emesis  . Diabetes mellitus   . GERD (gastroesophageal reflux disease)   . HOH (hard of hearing)   . HTN (hypertension) 01/13/2013  . Hyperlipidemia   . Hypertension   . Pacemaker 12/2016  . Persistent atrial fibrillation ([place])   . Presence of permanent cardiac pacemaker 09/20/2016  . Symptomatic bradycardia   . Vitamin D deficiency 07/17/2015   Patient Active Problem List   Diagnosis Date Noted  . Knee pain, bilateral 09/20/2019  . Morbid obesity ([place]) 09/14/2019  . Urinary frequency 11/06/2016  . S/P placement of cardiac pacemaker 10/24/2016  . Chronic diastolic CHF (congestive heart failure) ([place]) 10/24/2016  . Cardiac device in situ   . Debility 08/15/2016  . Persistent atrial fibrillation ([place]) 07/09/2016  . Vitamin D deficiency 07/17/2015  . Preventative health care 07/17/2015  . Lumbago 12/02/2014  . Pain of toe of left foot 03/14/2014  . Diabetes mellitus type 2 in obese ([place]) 12/12/2013  . Medicare annual wellness visit, subsequent 01/17/2013  . Essential hypertension 01/13/2013  . Chronic kidney disease stage III (GFR 30-59 ml/min)  05/03/2012  . Insomnia 06/27/2011  . Osteopenia 09/02/2010  . Family history of colon cancer 09/02/2010  . Diabetes mellitus with chronic kidney disease ([place])   . Hypertensive heart disease   . Hyperlipidemia LDL goal <70   . Carotid artery disease ([place])   . GERD (gastroesophageal reflux disease)   . CTS (carpal tunnel syndrome) 08/28/2010   Past Surgical History:  Procedure Laterality Date  . ANKLE ARTHROPLASTY    . CARDIOVERSION N/A 08/20/2016   Procedure: CARDIOVERSION;  Surgeon: Sanda Klein, MD;  Location: [facility];  Service: Cardiovascular;  Laterality: N/A;  . CATARACT EXTRACTION  2010, 2014  . CATARACT EXTRACTION  02/28/14  . CHOLECYSTECTOMY    . CHOLECYSTECTOMY N/A 05/03/2012   Procedure: LAPAROSCOPIC CHOLECYSTECTOMY WITH INTRAOPERATIVE CHOLANGIOGRAM;  Surgeon: [name], MD;  Location: [name];  Service: General;  Laterality: N/A;  . INSERT / REPLACE / REMOVE PACEMAKER  09/20/2016  . PACEMAKER IMPLANT N/A 09/20/2016   Procedure: Pacemaker Implant;  Surgeon: Deboraha Sprang, MD;  Location: [facility];  Service: Cardiovascular;  Laterality: N/A;  . ROTATOR CUFF REPAIR    . SHOULDER SURGERY    . TONSILLECTOMY     Current Outpatient Medications on File Prior to Visit  Medication Sig Dispense Refill  . amLODipine (NORVASC) 5 MG tablet Take 1 tablet (5 mg total) by mouth daily. 90 tablet 1  . atorvastatin (LIPITOR) 20 MG tablet Take 1 tablet (20 mg total) by mouth daily. 90 tablet 3  . Calcium 600 MG tablet Take 1 tablet (600 mg total)  by mouth 2 (two) times daily. 60 tablet 0  . ELIQUIS 2.5 MG TABS tablet TAKE 1 TABLET BY MOUTH TWICE A DAY 60 tablet 5  . furosemide (LASIX) 20 MG tablet TAKE 1 TABLET BY MOUTH DAILY AS NEEDED FOR EDEMA (WEIGHT GAIN >3LBS IN 24 HRS, SOB). 30 tablet 1  . glipiZIDE (GLUCOTROL) 10 MG tablet TAKE 2 TABLETS BY MOUTH TWICE A DAY BEFORE MEALS 360 tablet 0  . glucose blood (ONE TOUCH ULTRA TEST) test strip USE 1 STRIP 2 TIMES DAILY TO CHECK  BLOOD SUGAR DX E08.22, N18.3 300 each 1  . metoprolol succinate (TOPROL-XL) 25 MG 24 hr tablet TAKE 1 TABLET BY MOUTH EVERY DAY 90 tablet 0  . Semaglutide,0.25 or 0.5MG /DOS, (OZEMPIC, 0.25 OR 0.5 MG/DOSE,) 2 MG/1.5ML SOPN 0.25 mg SQ weekly x 4 weeks then increase to 0.5 mg weekly as needed and as directed. 3 mL 1  . triamterene-hydrochlorothiazide (MAXZIDE) 75-50 MG tablet TAKE 1 TABLET BY MOUTH EVERY DAY 90 tablet 1   No current facility-administered medications on file prior to visit.    Allergies  Allergen Reactions  . Penicillins Hives and Itching    Has patient had a PCN reaction causing immediate rash, facial/tongue/throat swelling, SOB or lightheadedness with hypotension:Yes Has patient had a PCN reaction causing severe rash involving mucus membranes or skin necrosis:No Has patient had a PCN reaction that required hospitalization:No Has patient had a PCN reaction occurring within the last 10 years:No If all of the above answers are "NO", then may proceed with Cephalosporin use.   . Verapamil     Junctional rhythm  . Influenza Vaccines Rash    tinnitus   Social History   Occupational History  . Not on file  Tobacco Use  . Smoking status: Never Smoker  . Smokeless tobacco: Never Used  Vaping Use  . Vaping Use: Never used  Substance and Sexual Activity  . Alcohol use: Yes    Alcohol/week: 1.0 standard drink    Types: 1 Glasses of wine per week  . Drug use: No  . 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.   Family History  Problem Relation Age of Onset  . Cancer Mother        colon, breast, pancreas, skin cancer  . Heart disease Father        heart valve replaced  . Stroke Father   . Diabetes Father   . Cancer Paternal Grandmother        colon  . Obesity Son   . Heart disease Son        bradycardia   Immunization History  Administered Date(s) Administered  .  Pneumococcal Conjugate-13 03/23/2015  . Pneumococcal Polysaccharide-23 06/27/2011  . Tdap 05/19/2014     Review of Systems: Negative except as noted in the HPI.  Objective: There were no vitals filed for this visit.  Vincent Allen is a pleasant 86 y.o. male in NAD. AAO X 3.  Vascular Examination: Capillary fill time to digits <3 seconds b/l lower extremities. Palpable pedal pulses b/l LE. Pedal hair absent. Lower extremity skin temperature gradient within normal limits. No pain with calf compression b/l. Nonpitting edema noted b/l lower extremities.  Dermatological Examination: Pedal skin with normal turgor, texture and tone bilaterally. No open wounds bilaterally. No interdigital macerations bilaterally. Toenails 1-5 b/l elongated, discolored, dystrophic, thickened, crumbly with subungual debris and tenderness to  dorsal palpation. Incurvated nailplate medial border(s) L hallux.  Nail border hypertrophy minimal. There is tenderness to palpation. Sign(s) of infection: no clinical signs of infection noted on examination today..  Musculoskeletal Examination: Normal muscle strength 5/5 to all lower extremity muscle groups bilaterally. No pain crepitus or joint limitation noted with ROM b/l. No gross bony deformities bilaterally. Utilizes cane for ambulation assistance.  Footwear Assessment: Does the patient wear appropriate shoes? Yes. Does the patient need inserts/orthotics? No.  Neurological Examination: Protective sensation intact 5/5 intact bilaterally with 10g monofilament b/l. Vibratory sensation intact b/l. Proprioception intact bilaterally.  Hemoglobin A1C Latest Ref Rng & Units 12/16/2019 09/14/2019 06/10/2019 02/15/2019  HGBA1C <5.7 % of total Hgb 7.2(H) 7.7(H) 7.4(H) 8.4(H)  Some recent data might be hidden   Assessment: 1. Pain due to onychomycosis of toenails of both feet   2. Ingrown toenail without infection   3. Diabetes mellitus due to underlying condition with stage 3  chronic kidney disease, unspecified whether long term insulin use, unspecified whether stage 3a or 3b CKD ([place])   4. Encounter for diabetic foot exam ([place])      ADA Risk Categorization: Low Risk :  Patient has all of the following: Intact protective sensation No prior foot ulcer  No severe deformity Pedal pulses present  Plan: -Examined patient. -Diabetic foot examination performed on today's visit. -Continue diabetic foot care principles. -Patient to continue soft, supportive shoe gear daily. -Toenails 1-5 b/l were debrided in length and girth with sterile nail nippers and dremel without iatrogenic bleeding.  -Offending nail border debrided and curretaged L hallux utilizing sterile nail nipper and currette. Border(s) cleansed with alcohol and triple antibiotic ointment applied. Patient instructed to apply Neosporin to L hallux once daily for 7 days. -Patient to report any pedal injuries to medical professional immediately. -Patient/POA to call should there be question/concern in the interim.  Return in about 3 months (around 05/04/2020) for diabetic foot care.  Marzetta Board, DPM

## 2020-02-07 ENCOUNTER — Other Ambulatory Visit: Payer: Self-pay | Admitting: Family Medicine

## 2020-02-18 ENCOUNTER — Other Ambulatory Visit: Payer: Self-pay | Admitting: Family Medicine

## 2020-02-21 ENCOUNTER — Encounter: Payer: Self-pay | Admitting: Family Medicine

## 2020-02-21 ENCOUNTER — Other Ambulatory Visit: Payer: Self-pay | Admitting: Family Medicine

## 2020-02-22 NOTE — Telephone Encounter (Signed)
Pt requesting refill on Januvia- no longer on med list. Please advise.

## 2020-02-25 ENCOUNTER — Ambulatory Visit: Payer: Self-pay | Admitting: Pharmacist

## 2020-02-25 ENCOUNTER — Other Ambulatory Visit: Payer: Self-pay | Admitting: *Deleted

## 2020-02-25 MED ORDER — JANUVIA 100 MG PO TABS
100.0000 mg | ORAL_TABLET | Freq: Every day | ORAL | 1 refills | Status: DC
Start: 2020-02-25 — End: 2020-07-18

## 2020-02-25 NOTE — Telephone Encounter (Signed)
Patient decided he would no like to do injectable therapy and a1c improved.  Rx sent to pharmacy for [place].

## 2020-02-25 NOTE — Chronic Care Management (AMB) (Signed)
Patient called me asking for Januvia refill.  Note that initial plan was to switch patient to Ozempic for increased efficacy in lowering a1c. At subsequent visit when observed a1c trend downward on his current regimen, pt and I agreed he could remain on Januvia.   Med list to reflect that patient is still taking Januvia 100mg  daily.  If cost becomes barrier, would be happy to coordinate patient assistance for Januvia.  If a1c trends up could consider oral GLP1 such as Rybelsus (pt revealed he would not like to use injectable) and apply for patient assistance for Rybelsus as cost may be a barrier for patient.   Plan -Update med list to reflect that patient takes Januvia 100mg  daily  -Discontinue Ozempic (pt decided he would not like to do injectable therapy and a1c improved from initial conversation) -Send in refills of [facility] [place] -If a1c is above goal consider initiation of oral GLP1 such as Rybelsus to replace Januvia. Patient assistance will most likely need to be pursued to prevent barrier to access  [place], PharmD, BCACP Clinical Pharmacist [facility] [phone]

## 2020-03-02 ENCOUNTER — Other Ambulatory Visit: Payer: Self-pay | Admitting: Family Medicine

## 2020-03-23 NOTE — Progress Notes (Signed)
Virtual Visit via Telephone Note   This visit type was conducted due to national recommendations for restrictions regarding the COVID-19 Pandemic (e.g. social distancing) in an effort to limit this patient's exposure and mitigate transmission in our community.  Due to his co-morbid illnesses, this patient is at least at moderate risk for complications without adequate follow up.  This format is felt to be most appropriate for this patient at this time.  The patient did not have access to video technology/had technical difficulties with video requiring transitioning to audio format only (telephone).  All issues noted in this document were discussed and addressed.  No physical exam could be performed with this format.  Please refer to the patient's chart for his  consent to telehealth for [facility].    Date:  03/24/2020   ID:  Vincent Allen, DOB 1933-08-13, MRN [id number] The patient was identified using 2 identifiers.  Patient Location: Home Provider Location: Office/Clinic  PCP:  [name], MD  Cardiologist:  Pixie Casino, MD  Electrophysiologist:  None   Evaluation Performed:  Follow-Up Visit  Chief Complaint:  Afib, PPM  History of Present Illness:    Vincent Allen is a 87 y.o. male with persistent atrial fibrillation with SVR, hypertensive heart disease, chronic diastolic heart failure, carotid artery disease, GERD, DM 2, CKD stage III.  He is a former Dr. Wynonia Lawman patient who is followed for paroxysmal atrial fibrillation.  He was chronically anticoagulated with Eliquis 2.5 mg twice daily due to age and renal function.  Echocardiogram at that time showed normal EF, mild diastolic dysfunction, and moderate left atrial enlargement. He was cardioverted in 2018 with symptom improvement. However, he converted back to Afib with rates in the 40s.  Since he was symptomatic with this, he underwent PPM insertion.  Interrogation shows 100% atrial fibrillation.  He was last seen  in clinic by Coletta Memos, NP on 09/21/2019 and was doing well at that time.  He presents today for follow-up. He has no complaints other than feeling unsteady on his feet. He now walks with a cane or uses a buggy at the grocery store. He denies chest pain, SOB, recent falls and syncope. He is compliant on all medications and has no bleeding problems on eliquis.  129/72 Weight 230 lbs -- > up from 222 lbs HR 79  The patient does not have symptoms concerning for COVID-19 infection (fever, chills, cough, or new shortness of breath).    Past Medical History:  Diagnosis Date  . Arthritis   . Bradycardia   . Cataract   . Chronic kidney disease stage III (GFR 30-59 ml/min) 05/03/2012  . Colon polyps   . Complication of anesthesia    emesis  . Diabetes mellitus   . GERD (gastroesophageal reflux disease)   . HOH (hard of hearing)   . HTN (hypertension) 01/13/2013  . Hyperlipidemia   . Hypertension   . Pacemaker 12/2016  . Persistent atrial fibrillation ([place])   . Presence of permanent cardiac pacemaker 09/20/2016  . Symptomatic bradycardia   . Vitamin D deficiency 07/17/2015   Past Surgical History:  Procedure Laterality Date  . ANKLE ARTHROPLASTY    . CARDIOVERSION N/A 08/20/2016   Procedure: CARDIOVERSION;  Surgeon: Sanda Klein, MD;  Location: [facility];  Service: Cardiovascular;  Laterality: N/A;  . CATARACT EXTRACTION  2010, 2014  . CATARACT EXTRACTION  02/28/14  . CHOLECYSTECTOMY    . CHOLECYSTECTOMY N/A 05/03/2012   Procedure: LAPAROSCOPIC CHOLECYSTECTOMY WITH INTRAOPERATIVE  CHOLANGIOGRAM;  Surgeon: [name], MD;  Location: [facility];  Service: General;  Laterality: N/A;  . INSERT / REPLACE / REMOVE PACEMAKER  09/20/2016  . PACEMAKER IMPLANT N/A 09/20/2016   Procedure: Pacemaker Implant;  Surgeon: Deboraha Sprang, MD;  Location: [facility];  Service: Cardiovascular;  Laterality: N/A;  . ROTATOR CUFF REPAIR    . SHOULDER SURGERY    . TONSILLECTOMY       Current  Meds  Medication Sig  . amLODipine (NORVASC) 5 MG tablet Take 1 tablet (5 mg total) by mouth daily.  Marland Kitchen atorvastatin (LIPITOR) 20 MG tablet Take 1 tablet (20 mg total) by mouth daily.  . Calcium 600 MG tablet Take 1 tablet (600 mg total) by mouth 2 (two) times daily.  Marland Kitchen ELIQUIS 2.5 MG TABS tablet TAKE 1 TABLET BY MOUTH TWICE A DAY  . furosemide (LASIX) 20 MG tablet TAKE 1 TABLET BY MOUTH DAILY AS NEEDED FOR EDEMA (WEIGHT GAIN >3LBS IN 24 HRS, SOB).  Marland Kitchen glipiZIDE (GLUCOTROL) 10 MG tablet Take 2 tablets (20 mg total) by mouth 2 (two) times daily before a meal.  . glucose blood (ONE TOUCH ULTRA TEST) test strip USE 1 STRIP 2 TIMES DAILY TO CHECK BLOOD SUGAR DX E08.22, N18.3  . JANUVIA 100 MG tablet Take 1 tablet (100 mg total) by mouth daily.  . metoprolol succinate (TOPROL-XL) 25 MG 24 hr tablet Take 1 tablet (25 mg total) by mouth daily.  Marland Kitchen triamterene-hydrochlorothiazide (MAXZIDE) 75-50 MG tablet TAKE 1 TABLET BY MOUTH EVERY DAY     Allergies:   Penicillins, Verapamil, and Influenza vaccines   Social History   Tobacco Use  . Smoking status: Never Smoker  . Smokeless tobacco: Never Used  Vaping Use  . Vaping Use: Never used  Substance Use Topics  . Alcohol use: Yes    Alcohol/week: 1.0 standard drink    Types: 1 Glasses of wine per week  . Drug use: No     Family Hx: The patient's family history includes Cancer in his mother and paternal grandmother; Diabetes in his father; Heart disease in his father and son; Obesity in his son; Stroke in his father.  ROS:   Please see the history of present illness.     All other systems reviewed and are negative.   Prior CV studies:   The following studies were reviewed today:  Last interrogation 12/27/19 - normal, no changes  Labs/Other Tests and Data Reviewed:    EKG:  No ECG reviewed.  Recent Labs: 12/16/2019: Hemoglobin 15.2; Platelets 198; TSH 4.49 02/03/2020: ALT 18; BUN 32; Creatinine, Ser 1.60; Potassium 4.0; Sodium 138    Recent Lipid Panel Lab Results  Component Value Date/Time   CHOL 163 12/16/2019 03:43 PM   TRIG 178 (H) 12/16/2019 03:43 PM   HDL 32 (L) 12/16/2019 03:43 PM   CHOLHDL 5.1 (H) 12/16/2019 03:43 PM   LDLCALC 101 (H) 12/16/2019 03:43 PM   LDLDIRECT 65.0 11/03/2018 10:38 AM    Wt Readings from Last 3 Encounters:  03/24/20 230 lb (104.3 kg)  12/16/19 230 lb 12.8 oz (104.7 kg)  09/21/19 232 lb (105.2 kg)     Risk Assessment/Calculations:     CHA2DS2-VASc Score = 6  This indicates a 9.7% annual risk of stroke. The patient's score is based upon: CHF History: Yes HTN History: Yes Diabetes History: Yes Stroke History: No Vascular Disease History: Yes Age Score: 2 Gender Score: 0     Objective:  Vital Signs:  BP 129/72   Pulse 79   Ht 6' (1.829 m)   Wt 230 lb (104.3 kg)   BMI 31.19 kg/m    VITAL SIGNS:  reviewed GEN:  no acute distress RESPIRATORY:  unlabored NEURO:  alert and oriented x 3, no obvious focal deficit PSYCH:  normal affect  ASSESSMENT & PLAN:    Afib with SVR Symptomatic bradycardia PPM in place Chronic anticoagulation - no problems with bleeding - palpitations - no recent syncope or falls   Hypertension - he is not taking his BP because he can't get the cuff tight by himself - last reading at PCP's office was 129/77 - no changes in medications   DM2 - last A1c 7.2% - followed by PCP      COVID-19 Education: The signs and symptoms of COVID-19 were discussed with the patient and how to seek care for testing (follow up with PCP or arrange E-visit).  The importance of social distancing was discussed today.  Time:   Today, I have spent 15 minutes with the patient with telehealth technology discussing the above problems.     Medication Adjustments/Labs and Tests Ordered: Current medicines are reviewed at length with the patient today.  Concerns regarding medicines are outlined above.   Tests Ordered: No orders of the defined types  were placed in this encounter.   Medication Changes: No orders of the defined types were placed in this encounter.   Follow Up:  In Person in 6 month(s)  Signed, Ledora Bottcher, [place]  03/24/2020 3:01 PM    [facility]

## 2020-03-24 ENCOUNTER — Telehealth (INDEPENDENT_AMBULATORY_CARE_PROVIDER_SITE_OTHER): Payer: PPO | Admitting: Physician Assistant

## 2020-03-24 ENCOUNTER — Encounter: Payer: Self-pay | Admitting: Physician Assistant

## 2020-03-24 VITALS — BP 129/72 | HR 79 | Ht 72.0 in | Wt 230.0 lb

## 2020-03-24 DIAGNOSIS — E0822 Diabetes mellitus due to underlying condition with diabetic chronic kidney disease: Secondary | ICD-10-CM | POA: Diagnosis not present

## 2020-03-24 DIAGNOSIS — I4821 Permanent atrial fibrillation: Secondary | ICD-10-CM | POA: Diagnosis not present

## 2020-03-24 DIAGNOSIS — N183 Chronic kidney disease, stage 3 unspecified: Secondary | ICD-10-CM

## 2020-03-24 DIAGNOSIS — R001 Bradycardia, unspecified: Secondary | ICD-10-CM

## 2020-03-24 DIAGNOSIS — Z950 Presence of cardiac pacemaker: Secondary | ICD-10-CM

## 2020-03-24 DIAGNOSIS — I10 Essential (primary) hypertension: Secondary | ICD-10-CM | POA: Diagnosis not present

## 2020-03-24 DIAGNOSIS — I495 Sick sinus syndrome: Secondary | ICD-10-CM | POA: Diagnosis not present

## 2020-03-24 NOTE — Patient Instructions (Signed)
Medication Instructions:  The current medical regimen is effective;  continue present plan and medications.  *If you need a refill on your cardiac medications before your next appointment, please call your pharmacy*   Follow-Up: At [facility], you and your health needs are our priority.  As part of our continuing mission to provide you with exceptional heart care, we have created designated Provider Care Teams.  These Care Teams include your primary Cardiologist (physician) and Advanced Practice Providers (APPs -  Physician Assistants and Nurse Practitioners) who all work together to provide you with the care you need, when you need it.  We recommend signing up for the patient portal called "MyChart".  Sign up information is provided on this After Visit Summary.  MyChart is used to connect with patients for Virtual Visits (Telemedicine).  Patients are able to view lab/test results, encounter notes, upcoming appointments, etc.  Non-urgent messages can be sent to your provider as well.   To learn more about what you can do with MyChart, go to [email or web address].    Your next appointment:   6 month(s)  The format for your next appointment:   In Person  Provider:   K. Chad Hilty, MD      

## 2020-03-29 ENCOUNTER — Ambulatory Visit (INDEPENDENT_AMBULATORY_CARE_PROVIDER_SITE_OTHER): Payer: PPO

## 2020-03-29 DIAGNOSIS — I495 Sick sinus syndrome: Secondary | ICD-10-CM

## 2020-03-29 LAB — CUP PACEART REMOTE DEVICE CHECK
Battery Remaining Longevity: 102 mo
Battery Voltage: 2.99 V
Brady Statistic RA Percent Paced: 1.19 %
Brady Statistic RV Percent Paced: 99.82 %
Date Time Interrogation Session: 20220111201459
Implantable Lead Implant Date: 20180706
Implantable Lead Implant Date: 20180706
Implantable Lead Location: 753859
Implantable Lead Location: 753860
Implantable Lead Model: 5076
Implantable Lead Model: 5076
Implantable Pulse Generator Implant Date: 20180706
Lead Channel Impedance Value: 285 Ohm
Lead Channel Impedance Value: 456 Ohm
Lead Channel Impedance Value: 475 Ohm
Lead Channel Impedance Value: 494 Ohm
Lead Channel Pacing Threshold Amplitude: 0.625 V
Lead Channel Pacing Threshold Pulse Width: 0.4 ms
Lead Channel Sensing Intrinsic Amplitude: 0.5 mV
Lead Channel Sensing Intrinsic Amplitude: 0.5 mV
Lead Channel Sensing Intrinsic Amplitude: 8.875 mV
Lead Channel Sensing Intrinsic Amplitude: 8.875 mV
Lead Channel Setting Pacing Amplitude: 2 V
Lead Channel Setting Pacing Amplitude: 2.5 V
Lead Channel Setting Pacing Pulse Width: 0.4 ms
Lead Channel Setting Sensing Sensitivity: 2.8 mV

## 2020-04-06 NOTE — Chronic Care Management (AMB) (Signed)
Chronic Care Management Pharmacy  Name: MCADOO MUZQUIZ  MRN: [id number] DOB: [birth year]  Chief Complaint/ HPI  Vincent Allen,  87 y.o. , male presents for their Follow-Up CCM visit with the clinical pharmacist via telephone due to COVID-19 Pandemic.  PCP : [name], MD  Their chronic conditions include: Hypertension, Hyperlipidemia, Diabetes, AFib, Heart Failure  Office Visits: 12/16/19: Visit w/ Dr. [name] -  Labs ordered. No med changes noted.  MD note: Notify sugar is better but creatinine (Kidney marker) is off slightly more. Make sure to drink 60-80 ounces of fluids daily and avoid taking over the counter meds without checking with [place]. We can either refer to nephrology (kidney doctor) for consultation or recheck cmp in 4-6 weeks at patient discretion.  Consult Visit: None since last CCM visit on 11/12/19.  Medications: Outpatient Encounter Medications as of 04/11/2020  Medication Sig  . amLODipine (NORVASC) 5 MG tablet Take 1 tablet (5 mg total) by mouth daily.  . Calcium 600 MG tablet Take 1 tablet (600 mg total) by mouth 2 (two) times daily.  Marland Kitchen ELIQUIS 2.5 MG TABS tablet TAKE 1 TABLET BY MOUTH TWICE A DAY  . furosemide (LASIX) 20 MG tablet TAKE 1 TABLET BY MOUTH DAILY AS NEEDED FOR EDEMA (WEIGHT GAIN >3LBS IN 24 HRS, SOB).  Marland Kitchen glipiZIDE (GLUCOTROL) 10 MG tablet Take 2 tablets (20 mg total) by mouth 2 (two) times daily before a meal.  . glucose blood (ONE TOUCH ULTRA TEST) test strip USE 1 STRIP 2 TIMES DAILY TO CHECK BLOOD SUGAR DX E08.22, N18.3  . JANUVIA 100 MG tablet Take 1 tablet (100 mg total) by mouth daily.  . metoprolol succinate (TOPROL-XL) 25 MG 24 hr tablet Take 1 tablet (25 mg total) by mouth daily.  Marland Kitchen triamterene-hydrochlorothiazide (MAXZIDE) 75-50 MG tablet TAKE 1 TABLET BY MOUTH EVERY DAY  . atorvastatin (LIPITOR) 20 MG tablet Take 1 tablet (20 mg total) by mouth daily.   No facility-administered encounter medications on file as of 04/11/2020.    SDOH Screenings   Alcohol Screen: Not on file  Depression (PHQ2-9): Low Risk   . PHQ-2 Score: 0  Financial Resource Strain: Medium Risk  . Difficulty of Paying Living Expenses: Somewhat hard  Food Insecurity: No Food Insecurity  . Worried About Charity fundraiser in the Last Year: Never true  . Ran Out of Food in the Last Year: Never true  Housing: Low Risk   . Last Housing Risk Score: 0  Physical Activity: Not on file  Social Connections: Not on file  Stress: Not on file  Tobacco Use: Low Risk   . Smoking Tobacco Use: Never Smoker  . Smokeless Tobacco Use: Never Used  Transportation Needs: No Transportation Needs  . Lack of Transportation (Medical): No  . Lack of Transportation (Non-Medical): No      Current Diagnosis/Assessment:  Goals Addressed            This Visit's Progress   . Chronic Care Management Pharmacy Care Plan       CARE PLAN ENTRY (see longitudinal plan of care for additional care plan information)  Current Barriers:  . Chronic Disease Management support, education, and care coordination needs related to Hypertension, Hyperlipidemia, Diabetes, AFib, Heart Failure   Hypertension BP Readings from Last 3 Encounters:  03/24/20 129/72  12/16/19 129/72  09/21/19 126/72   . Pharmacist Clinical Goal(s): o Over the next 90 days, patient will work with PharmD and providers to maintain BP goal <  140/90 . Current regimen:  . Amlodipine 7m daily AM . Triamterene-hctz 75-520mdaily AM . Patient self care activities - Over the next 90 days, patient will: o Maintain hypertension medication regimen.   Hyperlipidemia Lab Results  Component Value Date/Time   LDLCALC 101 (H) 12/16/2019 03:43 PM   LDLDIRECT 65.0 11/03/2018 10:38 AM   . Pharmacist Clinical Goal(s): o Over the next 90 days, patient will work with PharmD and providers to achieve LDL goal < 100 . Current regimen:  o Diet and exercise management   . Interventions: o Consider changing  atorvastatin to another cholesterol medication after next lipid panel o Consider repeat lipid panel . Patient self care activities - Over the next 90 days, patient will: o Achieve LDL <100  Diabetes Lab Results  Component Value Date/Time   HGBA1C 7.2 (H) 12/16/2019 03:43 PM   HGBA1C 7.7 (H) 09/14/2019 10:39 AM   . Pharmacist Clinical Goal(s): o Over the next 90 days, patient will work with PharmD and providers to achieve A1c goal <7% . Current regimen:  . Januvia 10040m tab daily AM . Glipizide 61m70m twice daily AM and Dinner . Glucocil #2 twice daily . Interventions: o Consider PAP for Januvia, email sent with application o Requested patient to check blood sugar 2 hours after meal o Reviewed home blood sugar logs (fasting) . Patient self care activities - Over the next 90 days, patient will: o Check blood sugar once daily, document, and provide at future appointments o Contact provider with any episodes of hypoglycemia  Afib  . Pharmacist Clinical Goal(s) o Over the next 90 days, patient will work with PharmD and providers to reduce complications associated with AFib . Current regimen:   Eliquis 2.5mg 86mce daily AM and Dinner  Metoprolol succinate 25mg 55my AM . Interventions: o Emailed updated paperwork for Eliquis PAP (patient will need to meet 3% OOP spend before qualification) . Patient self care activities - Over the next 90 days, patient will: o Maintain Afib medication regimen o Notify providers once 3% out of pocket has been met  Medication management . Pharmacist Clinical Goal(s): o Over the next 90 days, patient will work with PharmD and providers to achieve optimal medication adherence . [facility] . Interventions o Comprehensive medication review performed. o Continue current medication management strategy . Patient self care activities - Over the next 90 days, patient will: o Focus on medication adherence by filling and taking medications  appropriately  o Take medications as prescribed o Report any questions or concerns to PharmD and/or provider(s)  Please see past updates related to this goal by clicking on the "Past Updates" button in the selected goal         Diabetes   A1c goal <7%  FBG: 80-130 PPBG: <180  Recent Relevant Labs: Lab Results  Component Value Date/Time   HGBA1C 7.2 (H) 12/16/2019 03:43 PM   HGBA1C 7.7 (H) 09/14/2019 10:39 AM   GFR 39.32 (L) 02/03/2020 08:09 AM   GFR 39.93 (L) 09/14/2019 10:39 AM   MICROALBUR 26.7 (H) 07/15/2016 10:22 AM   MICROALBUR 1.01 07/07/2012 09:42 AM    Last diabetic Eye exam:  Lab Results  Component Value Date/Time   HMDIABEYEEXA No Retinopathy 05/26/2019 12:00 AM    Last diabetic Foot exam: No results found for: HMDIABFOOTEX   Checking BG: Daily (fasting)  Recent FBG Readings: 82 86 118 109 86 102 190 (banana pudding and ribs the night before at the [employer]) 126 133  96 134 135 156 129 Avg 120.1   Patient has failed these meds in past: metformin (renal fx) Patient is currently uncontrolled, but trending down on the following medications: . Januvia 153m 1 tab daily AM . Glipizide 150m#2 twice daily AM and Dinner . Glucocil #2 twice daily (dietary supplement)  Patient states cost of Januvia is a barrier. Can complete PAP for Januvia and/or consider completing PAP for GLP1 to replace DDP4 for greater a1c lowering.   Fasting BG at goal. Suspect PPBG are above goal.  Update 11/12/19 101 112 137 98 128 136 131 134 Avg 122.1  Discussed switch to GLP1 and patient states he is not ready to do an injection. Discussed that he could get better a1c lowering, but he states he is not ready. He states he would like to continue with his current regiment of Januvia and glipizide because he feels his BG are remaining stable. Suspect his post prandial BG is above goal.   Discussed how due to his renal function he should probably stick with 1/2  tab, but his BG may benefit from taking a whole tab.  Could consider trying oral GLP1 (Rybelsus) and doing a PAP for this medication, but patient may be hesitant to change. Will continue to monitor and determine next steps after determining if his PPBG is above goal and/or at next a1c check.  Update 01/11/20 A1c trending down. Will continue with current regimen for now. If next a1c >7% will consider completing paperwork for Rybelsus.  He has "cake fits" 111 this AM Highest: 180 (barbeque rib day) Lowest: 98 (none less than 90)  Discussed request Dr. BlCharlett Blakead for patient to follow up with CMP or see nephrology. Pt would like to schedule labs. Will help coordinate lab appt.  Update 04/11/20 113, 99, 112, 103, 107 (average 107) Prefers to keep regimen the same, will assess after next A1c coming up Denies any episodes of hypoglycemia Still is not checking PPG sugars Recommend A1c at upcoming visit on 2/3 Will email patient's son a copy of the Januiva PAP application   Plan -Check BG 2 hrs after meals for PPBG assessment -Continue current medications  - A1c at next visit  Future Plan -If A1c elevated - may consider Rybelsus for better A1c lowering as patient prefers non - injectable   AFIB   Patient is currently rate controlled.  Patient has failed these meds in past: verapamil (junctional rhythm) Patient is currently controlled on the following medications:  Eliquis 2.63m42mwice daily AM and Dinner  Metoprolol succinate 263m66mily AM  Patient states cost of Eliquis can be a barrier. Will complete Eliquis PAP paperwork.  Denies palpitations or bleeding.  Update 11/12/19 Patient has not received Eliquis PAP paperwork. Will send again.   Update 01/11/20 Received PAP paperwork from patient. Received signature from Dr. BlytCharlett Blake faxed PAP application. He is paying $125/30DS of Eliquis.  Update 04/11/20 Eliquis copay is going to be a barrier he has 13 days left, discussed  the OOP spend requirement for this program of 3%.  He will contact us [place] he has met this.  Will email application to patient to have filled out in the mean time. Denies abnormal bruising or bleeding.  Plan -Continue current medications  -Email PAP application  ChriBeverly MilcharmD Clinical Pharmacist [phone]

## 2020-04-10 ENCOUNTER — Telehealth: Payer: Self-pay | Admitting: Pharmacist

## 2020-04-10 NOTE — Progress Notes (Addendum)
Chronic Care Management Pharmacy Assistant   Name: Vincent Allen  MRN: [id number] DOB: 27-Sep-1933  Reason for Encounter: Patient Phone Call   PCP : Vincent Lukes, MD  Allergies:   Allergies  Allergen Reactions   Penicillins Hives and Itching    Has patient had a PCN reaction causing immediate rash, facial/tongue/throat swelling, SOB or lightheadedness with hypotension:Yes Has patient had a PCN reaction causing severe rash involving mucus membranes or skin necrosis:No Has patient had a PCN reaction that required hospitalization:No Has patient had a PCN reaction occurring within the last 10 years:No If all of the above answers are "NO", then may proceed with Cephalosporin use.    Verapamil     Junctional rhythm   Influenza Vaccines Rash    tinnitus    Medications: Outpatient Encounter Medications as of 04/10/2020  Medication Sig   amLODipine (NORVASC) 5 MG tablet Take 1 tablet (5 mg total) by mouth daily.   atorvastatin (LIPITOR) 20 MG tablet Take 1 tablet (20 mg total) by mouth daily.   Calcium 600 MG tablet Take 1 tablet (600 mg total) by mouth 2 (two) times daily.   ELIQUIS 2.5 MG TABS tablet TAKE 1 TABLET BY MOUTH TWICE A DAY   furosemide (LASIX) 20 MG tablet TAKE 1 TABLET BY MOUTH DAILY AS NEEDED FOR EDEMA (WEIGHT GAIN >3LBS IN 24 HRS, SOB).   glipiZIDE (GLUCOTROL) 10 MG tablet Take 2 tablets (20 mg total) by mouth 2 (two) times daily before a meal.   glucose blood (ONE TOUCH ULTRA TEST) test strip USE 1 STRIP 2 TIMES DAILY TO CHECK BLOOD SUGAR DX E08.22, N18.3   JANUVIA 100 MG tablet Take 1 tablet (100 mg total) by mouth daily.   metoprolol succinate (TOPROL-XL) 25 MG 24 hr tablet Take 1 tablet (25 mg total) by mouth daily.   triamterene-hydrochlorothiazide (MAXZIDE) 75-50 MG tablet TAKE 1 TABLET BY MOUTH EVERY DAY   No facility-administered encounter medications on file as of 04/10/2020.    Current Diagnosis: Patient Active Problem List   Diagnosis Date Noted    Knee pain, bilateral 09/20/2019   Morbid obesity ([place]) 09/14/2019   Urinary frequency 11/06/2016   S/P placement of cardiac pacemaker 10/24/2016   Chronic diastolic CHF (congestive heart failure) ([place]) 10/24/2016   Cardiac device in situ    Debility 08/15/2016   Persistent atrial fibrillation ([place]) 07/09/2016   Vitamin D deficiency 07/17/2015   Preventative health care 07/17/2015   Lumbago 12/02/2014   Pain of toe of left foot 03/14/2014   Diabetes mellitus type 2 in obese ([place]) 12/12/2013   Medicare annual wellness visit, subsequent 01/17/2013   Essential hypertension 01/13/2013   Chronic kidney disease stage III (GFR 30-59 ml/min) 05/03/2012   Insomnia 06/27/2011   Osteopenia 09/02/2010   Family history of colon cancer 09/02/2010   Diabetes mellitus with chronic kidney disease ([place])    Hypertensive heart disease    Hyperlipidemia LDL goal <70    Carotid artery disease ([facility])    GERD (gastroesophageal reflux disease)    CTS (carpal tunnel syndrome) 08/28/2010    Goals Addressed   None    Patient contacted me regarding his patient assistance for Eliquis. He applied last calendar year and was approved but has questions about 2022 eligibility. Informed Vincent Allen that the information I have indicates he would need to submit up to 3% out of pocket medication expenses before the manufacturer would begin to cover the medication expenses. Since it was so early in  the year I did not believe he had reached that amount yet. Informed him that I would contact Roosvelt Harps on his behalf.  Made outbound call to [facility]. Verified the out of pocket expenses needed to meet 3% before he would be able to get the benefit. Spoke with Vincent Allen who stated Vincent Allen application was still valid, however he does need to provide the OOP expenses directly to [facility]. Currently his is the verification process and would remain in that status until all patient documents were received.  Called  Vincent Allen back to inform him of this information. Went through his annual income and household size. Based on the numbers provided by the patient, he would need to have $602 out of pocket expenses towards medication before he can submit his receipts to [facility].Patient stated understanding. He agreed to keep track of the monthly figures and send in his documents at the appropriate time. Updated the patient's progress in Patient Assistance Tracker.  Follow-Up:  Pharmacist Review   Fanny Skates, [place] Pharmacist Assistant [phone]  2 minutes spent in review, coordination, and documentation.  Reviewed by: Beverly Milch, PharmD Clinical Pharmacist [facility] [phone]

## 2020-04-11 ENCOUNTER — Ambulatory Visit: Payer: PPO

## 2020-04-11 DIAGNOSIS — E0822 Diabetes mellitus due to underlying condition with diabetic chronic kidney disease: Secondary | ICD-10-CM

## 2020-04-11 DIAGNOSIS — I4819 Other persistent atrial fibrillation: Secondary | ICD-10-CM

## 2020-04-11 NOTE — Patient Instructions (Addendum)
Visit Information  Goals Addressed            This Visit's Progress   . Chronic Care Management Pharmacy Care Plan       CARE PLAN ENTRY (see longitudinal plan of care for additional care plan information)  Current Barriers:  . Chronic Disease Management support, education, and care coordination needs related to Hypertension, Hyperlipidemia, Diabetes, AFib, Heart Failure   Hypertension BP Readings from Last 3 Encounters:  03/24/20 129/72  12/16/19 129/72  09/21/19 126/72   . Pharmacist Clinical Goal(s): o Over the next 90 days, patient will work with PharmD and providers to maintain BP goal <140/90 . Current regimen:  . Amlodipine 20m daily AM . Triamterene-hctz 75-568mdaily AM . Patient self care activities - Over the next 90 days, patient will: o Maintain hypertension medication regimen.   Hyperlipidemia Lab Results  Component Value Date/Time   LDLCALC 101 (H) 12/16/2019 03:43 PM   LDLDIRECT 65.0 11/03/2018 10:38 AM   . Pharmacist Clinical Goal(s): o Over the next 90 days, patient will work with PharmD and providers to achieve LDL goal < 100 . Current regimen:  o Diet and exercise management   . Interventions: o Consider changing atorvastatin to another cholesterol medication after next lipid panel o Consider repeat lipid panel . Patient self care activities - Over the next 90 days, patient will: o Achieve LDL <100  Diabetes Lab Results  Component Value Date/Time   HGBA1C 7.2 (H) 12/16/2019 03:43 PM   HGBA1C 7.7 (H) 09/14/2019 10:39 AM   . Pharmacist Clinical Goal(s): o Over the next 90 days, patient will work with PharmD and providers to achieve A1c goal <7% . Current regimen:  . Januvia 10042m tab daily AM . Glipizide 25m31m twice daily AM and Dinner . Glucocil #2 twice daily . Interventions: o Consider PAP for Januvia, email sent with application o Requested patient to check blood sugar 2 hours after meal o Reviewed home blood sugar logs  (fasting) . Patient self care activities - Over the next 90 days, patient will: o Check blood sugar once daily, document, and provide at future appointments o Contact provider with any episodes of hypoglycemia  Afib  . Pharmacist Clinical Goal(s) o Over the next 90 days, patient will work with PharmD and providers to reduce complications associated with AFib . Current regimen:   Eliquis 2.5mg 3mce daily AM and Dinner  Metoprolol succinate 25mg 47my AM . Interventions: o Emailed updated paperwork for Eliquis PAP (patient will need to meet 3% OOP spend before qualification) . Patient self care activities - Over the next 90 days, patient will: o Maintain Afib medication regimen o Notify providers once 3% out of pocket has been met  Medication management . Pharmacist Clinical Goal(s): o Over the next 90 days, patient will work with PharmD and providers to achieve optimal medication adherence . [facility] . Interventions o Comprehensive medication review performed. o Continue current medication management strategy . Patient self care activities - Over the next 90 days, patient will: o Focus on medication adherence by filling and taking medications appropriately  o Take medications as prescribed o Report any questions or concerns to PharmD and/or provider(s)  Please see past updates related to this goal by clicking on the "Past Updates" button in the selected goal         The patient verbalized understanding of instructions, educational materials, and care plan provided today and agreed to receive a mailed copy of patient instructions,  educational materials, and care plan.   Telephone follow up appointment with pharmacy team member scheduled for:  3 months  Edythe Clarity, [facility]  Preventing Atrial Fibrillation-Related Stroke Atrial fibrillation (AFib) is a common type of irregular or rapid heartbeat (arrhythmia) that increases the risk for a stroke. In AFib, the  top portions of the heart (atria) beat out of sync with the lower portions of the heart. When the muscles of the atria tighten in an uncoordinated way (fibrillating), blood can pool in the heart and form clots. A stroke can be caused by a blood clot that travels to the brain. This type of stroke is preventable. Understanding AFib and knowing how to properly manage it can prevent a stroke from happening. How can this condition affect me? Having AFib can increase your risk for a stroke. A stroke is a medical emergency. It can lead to brain damage and can sometimes be life-threatening. A stroke can be a life-changing event. What can increase my risk? Having AFib can increase your risk of stroke. Other risk factors include:  Having heart failure.  Having high blood pressure.  Being older than age 11.  Having diabetes.  Having a history of vascular disease, such as heart attack or stroke.  Being male.  Having a history of transient ischemic attacks (TIAs). This is sometimes called a ministroke.  Having a family history of stroke. Risk factors that you can change include:  Smoking.  High cholesterol.  Being inactive (sedentary lifestyle).  Eating a diet that is high in fat, cholesterol, and salt. What actions can I take to prevent this?  Maintain a healthy weight.  Get regular exercise. This can include at least 40 minutes of moderate exercise on most days, such as walking at a fast pace, or vigorous exercise, such as jogging.  Get evaluated for obstructive sleep apnea. Talk to your health care provider about getting a sleep evaluation if you snore a lot or have excessive sleepiness.  Manage any other medical conditions you have, such as hypertension or diabetes. Medicines  Take over-the-counter and prescription medicines only as told by your health care provider.  If your health care provider prescribed an anticoagulant, take it exactly as told. Taking too much blood-thinning  medicine can cause bleeding. Taking too little may not give you the protection that you need against stroke and other problems. Eating and drinking  Eat healthy foods, including at least 5 servings of fruits and vegetables a day.  Do not drink alcohol.  Do not drink beverages that have caffeine, such as coffee, soda, and tea.  Follow instructions about your diet as told by your health care provider. Questions to ask your health care provider Contact a health care provider if:  You notice a change in the rate, rhythm, or strength of your heartbeat.  You feel dizzy.  You are taking an anticoagulant and you have more bruises than usual.  You get tired more easily when you exercise or do similar activities. Get help right away if:  You have chest pain.  You have trouble breathing.  You have pain in your abdomen.  You experience unusual sweating or weakness.  You take anticoagulants and you: ? Have severe headaches or confusion. ? Have blood in your vomit, bowel movement, or urine. ? Have bleeding that will not stop. ? Fall or injure your head.  You have any symptoms of a stroke. "BE FAST" is an easy way to remember the main warning signs of a stroke: ?  B - Balance. Signs are dizziness, trouble walking, or loss of balance. ? E - Eyes. Signs are trouble seeing or a sudden change in vision. ? F - Face. Signs are sudden weakness or numbness of the face, or the face or eyelid drooping on one side. ? A - Arms. Signs are weakness or numbness in an arm. This happens suddenly and usually on one side of the body. ? S - Speech. Signs are sudden trouble speaking, slurred speech, or trouble understanding what people say. ? T - Time. Time to call emergency services. Write down what time symptoms started.  You have other signs of a stroke, such as: ? A sudden, severe headache with no known cause. ? Nausea or vomiting. ? Seizure. These symptoms may represent a serious problem that is an  emergency. Do not wait to see if the symptoms will go away. Get medical help right away. Call your local emergency services (911 in the [place] Do not drive yourself to the hospital.   Summary  Having atrial fibrillation (AFib) can increase the risk for a stroke. Talk with your health care provider about what symptoms to watch for.  AFib-related stroke is preventable. Proper management of AFib can prevent you from having a stroke.  Talk with your health care provider about whether anticoagulant medicine is right for you.  Learn the warning signs of a stroke and remember "BE FAST." This information is not intended to replace advice given to you by your health care provider. Make sure you discuss any questions you have with your health care provider. Document Revised: 11/11/2019 Document Reviewed: 11/11/2019 Elsevier Patient Education  [place].

## 2020-04-11 NOTE — Progress Notes (Signed)
Remote pacemaker transmission.   

## 2020-04-13 DIAGNOSIS — C44311 Basal cell carcinoma of skin of nose: Secondary | ICD-10-CM | POA: Diagnosis not present

## 2020-04-13 DIAGNOSIS — C44319 Basal cell carcinoma of skin of other parts of face: Secondary | ICD-10-CM | POA: Diagnosis not present

## 2020-04-13 DIAGNOSIS — Z85828 Personal history of other malignant neoplasm of skin: Secondary | ICD-10-CM | POA: Diagnosis not present

## 2020-04-13 DIAGNOSIS — D692 Other nonthrombocytopenic purpura: Secondary | ICD-10-CM | POA: Diagnosis not present

## 2020-04-13 DIAGNOSIS — L570 Actinic keratosis: Secondary | ICD-10-CM | POA: Diagnosis not present

## 2020-04-13 DIAGNOSIS — I872 Venous insufficiency (chronic) (peripheral): Secondary | ICD-10-CM | POA: Diagnosis not present

## 2020-04-13 DIAGNOSIS — I8311 Varicose veins of right lower extremity with inflammation: Secondary | ICD-10-CM | POA: Diagnosis not present

## 2020-04-13 DIAGNOSIS — I8312 Varicose veins of left lower extremity with inflammation: Secondary | ICD-10-CM | POA: Diagnosis not present

## 2020-04-13 DIAGNOSIS — D485 Neoplasm of uncertain behavior of skin: Secondary | ICD-10-CM | POA: Diagnosis not present

## 2020-04-20 ENCOUNTER — Other Ambulatory Visit: Payer: Self-pay

## 2020-04-20 ENCOUNTER — Ambulatory Visit (INDEPENDENT_AMBULATORY_CARE_PROVIDER_SITE_OTHER): Payer: PPO | Admitting: Family Medicine

## 2020-04-20 VITALS — BP 138/88 | HR 92 | Temp 97.3°F | Resp 20 | Wt 236.2 lb

## 2020-04-20 DIAGNOSIS — E0822 Diabetes mellitus due to underlying condition with diabetic chronic kidney disease: Secondary | ICD-10-CM

## 2020-04-20 DIAGNOSIS — N183 Chronic kidney disease, stage 3 unspecified: Secondary | ICD-10-CM

## 2020-04-20 DIAGNOSIS — E669 Obesity, unspecified: Secondary | ICD-10-CM

## 2020-04-20 DIAGNOSIS — E559 Vitamin D deficiency, unspecified: Secondary | ICD-10-CM | POA: Diagnosis not present

## 2020-04-20 DIAGNOSIS — E1169 Type 2 diabetes mellitus with other specified complication: Secondary | ICD-10-CM

## 2020-04-20 DIAGNOSIS — I4819 Other persistent atrial fibrillation: Secondary | ICD-10-CM | POA: Diagnosis not present

## 2020-04-20 DIAGNOSIS — E785 Hyperlipidemia, unspecified: Secondary | ICD-10-CM | POA: Diagnosis not present

## 2020-04-20 DIAGNOSIS — I10 Essential (primary) hypertension: Secondary | ICD-10-CM

## 2020-04-20 LAB — LIPID PANEL
Cholesterol: 153 mg/dL (ref 0–200)
HDL: 35.1 mg/dL — ABNORMAL LOW (ref >39.00)
LDL Cholesterol: 95 mg/dL (ref 0–99)
NonHDL: 117.94
Total CHOL/HDL Ratio: 4
Triglycerides: 113 mg/dL (ref 0.0–149.0)
VLDL: 22.6 mg/dL (ref 0.0–40.0)

## 2020-04-20 LAB — COMPREHENSIVE METABOLIC PANEL
ALT: 17 U/L (ref 0–53)
AST: 20 U/L (ref 0–37)
Albumin: 4.1 g/dL (ref 3.5–5.2)
Alkaline Phosphatase: 65 U/L (ref 39–117)
BUN: 39 mg/dL — ABNORMAL HIGH (ref 6–23)
CO2: 26 mEq/L (ref 19–32)
Calcium: 10.1 mg/dL (ref 8.4–10.5)
Chloride: 100 mEq/L (ref 96–112)
Creatinine, Ser: 1.89 mg/dL — ABNORMAL HIGH (ref 0.40–1.50)
GFR: 32.15 mL/min — ABNORMAL LOW (ref >60.00)
Glucose, Bld: 158 mg/dL — ABNORMAL HIGH (ref 70–99)
Potassium: 3.9 mEq/L (ref 3.5–5.1)
Sodium: 136 mEq/L (ref 135–145)
Total Bilirubin: 1 mg/dL (ref 0.2–1.2)
Total Protein: 7.3 g/dL (ref 6.0–8.3)

## 2020-04-20 LAB — HEMOGLOBIN A1C: Hgb A1c MFr Bld: 7 % — ABNORMAL HIGH (ref 4.6–6.5)

## 2020-04-20 LAB — VITAMIN D 25 HYDROXY (VIT D DEFICIENCY, FRACTURES): VITD: 74.75 ng/mL (ref 30.00–100.00)

## 2020-04-20 LAB — CBC
HCT: 42.2 % (ref 39.0–52.0)
Hemoglobin: 13.3 g/dL (ref 13.0–17.0)
MCHC: 31.6 g/dL (ref 30.0–36.0)
MCV: 85.1 fl (ref 78.0–100.0)
Platelets: 175 10*3/uL (ref 150.0–400.0)
RBC: 4.96 Mil/uL (ref 4.22–5.81)
RDW: 14.3 % (ref 11.5–15.5)
WBC: 7.6 10*3/uL (ref 4.0–10.5)

## 2020-04-20 LAB — TSH: TSH: 4.84 u[IU]/mL — ABNORMAL HIGH (ref 0.35–4.50)

## 2020-04-20 NOTE — Patient Instructions (Signed)

## 2020-04-23 NOTE — Assessment & Plan Note (Signed)
Supplement and monitor 

## 2020-04-23 NOTE — Assessment & Plan Note (Signed)
hgba1c acceptable, minimize simple carbs. Increase exercise as tolerated. Continue current meds 

## 2020-04-23 NOTE — Assessment & Plan Note (Signed)
Rate controlled and tolerating Eliquis  

## 2020-04-23 NOTE — Assessment & Plan Note (Signed)
Well controlled, no changes to meds. Encouraged heart healthy diet such as the DASH diet and exercise as tolerated.  °

## 2020-04-23 NOTE — Assessment & Plan Note (Signed)
Encouraged heart healthy diet, increase exercise, avoid trans fats, consider a krill oil cap daily 

## 2020-04-23 NOTE — Progress Notes (Signed)
Subjective:    Patient ID: Vincent Allen, male    DOB: [birth year], 87 y.o.   MRN: [id number]  Chief Complaint  Patient presents with  . Follow-up    HPI Patient is in today for follow up on chronic medical concerns. No recent febrile illness or hospitalizations. He is doing well overall. He has not suffered any falls or injury. Tries to maintain a heart healthy diet. Denies CP/palp/SOB/HA/congestion/fevers/GI or GU c/o. Taking meds as prescribed  Past Medical History:  Diagnosis Date  . Arthritis   . Bradycardia   . Cataract   . Chronic kidney disease stage III (GFR 30-59 ml/min) 05/03/2012  . Colon polyps   . Complication of anesthesia    emesis  . Diabetes mellitus   . GERD (gastroesophageal reflux disease)   . HOH (hard of hearing)   . HTN (hypertension) 01/13/2013  . Hyperlipidemia   . Hypertension   . Pacemaker 12/2016  . Persistent atrial fibrillation ([place])   . Presence of permanent cardiac pacemaker 09/20/2016  . Symptomatic bradycardia   . Vitamin D deficiency 07/17/2015    Past Surgical History:  Procedure Laterality Date  . ANKLE ARTHROPLASTY    . CARDIOVERSION N/A 08/20/2016   Procedure: CARDIOVERSION;  Surgeon: Sanda Klein, MD;  Location: [facility];  Service: Cardiovascular;  Laterality: N/A;  . CATARACT EXTRACTION  2010, 2014  . CATARACT EXTRACTION  02/28/14  . CHOLECYSTECTOMY    . CHOLECYSTECTOMY N/A 05/03/2012   Procedure: LAPAROSCOPIC CHOLECYSTECTOMY WITH INTRAOPERATIVE CHOLANGIOGRAM;  Surgeon: [name], MD;  Location: [facility];  Service: General;  Laterality: N/A;  . INSERT / REPLACE / REMOVE PACEMAKER  09/20/2016  . PACEMAKER IMPLANT N/A 09/20/2016   Procedure: Pacemaker Implant;  Surgeon: Deboraha Sprang, MD;  Location: [facility];  Service: Cardiovascular;  Laterality: N/A;  . ROTATOR CUFF REPAIR    . SHOULDER SURGERY    . TONSILLECTOMY      Family History  Problem Relation Age of Onset  . Cancer Mother        colon, breast,  pancreas, skin cancer  . Heart disease Father        heart valve replaced  . Stroke Father   . Diabetes Father   . Cancer Paternal Grandmother        colon  . Obesity Son   . Heart disease Son        bradycardia    Social History   Socioeconomic History  . Marital status: Widowed    Spouse name: Not on file  . Number of children: Not on file  . Years of education: Not on file  . Highest education level: Not on file  Occupational History  . Not on file  Tobacco Use  . Smoking status: Never Smoker  . Smokeless tobacco: Never Used  Vaping Use  . Vaping Use: Never used  Substance and Sexual Activity  . Alcohol use: Yes    Alcohol/week: 1.0 standard drink    Types: 1 Glasses of wine per week  . Drug use: No  . 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.  Other Topics Concern  . Not on file  Social History Narrative  . Not on file   Social Determinants of Health   Financial Resource Strain: Medium Risk  . Difficulty of Paying Living Expenses: Somewhat hard  Food Insecurity: No Food Insecurity  .  Worried About Charity fundraiser in the Last Year: Never true  . Ran Out of Food in the Last Year: Never true  Transportation Needs: No Transportation Needs  . Lack of Transportation (Medical): No  . Lack of Transportation (Non-Medical): No  Physical Activity: Not on file  Stress: Not on file  Social Connections: Not on file  Intimate Partner Violence: Not on file    Outpatient Medications Prior to Visit  Medication Sig Dispense Refill  . amLODipine (NORVASC) 5 MG tablet Take 1 tablet (5 mg total) by mouth daily. 90 tablet 1  . Calcium 600 MG tablet Take 1 tablet (600 mg total) by mouth 2 (two) times daily. 60 tablet 0  . ELIQUIS 2.5 MG TABS tablet TAKE 1 TABLET BY MOUTH TWICE A DAY 60 tablet 5  . furosemide (LASIX) 20 MG tablet TAKE 1 TABLET BY MOUTH DAILY AS NEEDED FOR  EDEMA (WEIGHT GAIN >3LBS IN 24 HRS, SOB). 30 tablet 1  . glipiZIDE (GLUCOTROL) 10 MG tablet Take 2 tablets (20 mg total) by mouth 2 (two) times daily before a meal. 360 tablet 0  . glucose blood (ONE TOUCH ULTRA TEST) test strip USE 1 STRIP 2 TIMES DAILY TO CHECK BLOOD SUGAR DX E08.22, N18.3 300 each 1  . JANUVIA 100 MG tablet Take 1 tablet (100 mg total) by mouth daily. 90 tablet 1  . metoprolol succinate (TOPROL-XL) 25 MG 24 hr tablet Take 1 tablet (25 mg total) by mouth daily. 90 tablet 1  . triamterene-hydrochlorothiazide (MAXZIDE) 75-50 MG tablet TAKE 1 TABLET BY MOUTH EVERY DAY 90 tablet 1  . atorvastatin (LIPITOR) 20 MG tablet Take 1 tablet (20 mg total) by mouth daily. 90 tablet 3   No facility-administered medications prior to visit.    Allergies  Allergen Reactions  . Penicillins Hives and Itching    Has patient had a PCN reaction causing immediate rash, facial/tongue/throat swelling, SOB or lightheadedness with hypotension:Yes Has patient had a PCN reaction causing severe rash involving mucus membranes or skin necrosis:No Has patient had a PCN reaction that required hospitalization:No Has patient had a PCN reaction occurring within the last 10 years:No If all of the above answers are "NO", then may proceed with Cephalosporin use.   . Verapamil     Junctional rhythm  . Influenza Vaccines Rash    tinnitus    Review of Systems  Constitutional: Negative for fever and malaise/fatigue.  HENT: Negative for congestion.   Eyes: Negative for blurred vision.  Respiratory: Negative for shortness of breath.   Cardiovascular: Negative for chest pain, palpitations and leg swelling.  Gastrointestinal: Negative for abdominal pain, blood in stool and nausea.  Genitourinary: Negative for dysuria and frequency.  Musculoskeletal: Negative for falls.  Skin: Negative for rash.  Neurological: Negative for dizziness, loss of consciousness and headaches.  Endo/Heme/Allergies: Negative for  environmental allergies.  Psychiatric/Behavioral: Negative for depression. The patient is not nervous/anxious.        Objective:    Physical Exam Vitals and nursing note reviewed.  Constitutional:      General: He is not in acute distress.    Appearance: He is well-developed and well-nourished.  HENT:     Head: Normocephalic and atraumatic.     Nose: Nose normal.  Eyes:     General:        Right eye: No discharge.        Left eye: No discharge.  Cardiovascular:     Rate and Rhythm: Normal rate. Rhythm  irregular.     Heart sounds: No murmur heard.   Pulmonary:     Effort: Pulmonary effort is normal.     Breath sounds: Normal breath sounds.  Abdominal:     General: Bowel sounds are normal.     Palpations: Abdomen is soft.     Tenderness: There is no abdominal tenderness.  Musculoskeletal:        General: No edema.     Cervical back: Normal range of motion and neck supple.  Skin:    General: Skin is warm and dry.  Neurological:     Mental Status: He is alert and oriented to person, place, and time.  Psychiatric:        Mood and Affect: Mood and affect normal.     BP 138/88   Pulse 92   Temp (!) 97.3 F (36.3 C)   Resp 20   Wt 236 lb 3.2 oz (107.1 kg)   SpO2 99%   BMI 32.03 kg/m  Wt Readings from Last 3 Encounters:  04/20/20 236 lb 3.2 oz (107.1 kg)  03/24/20 230 lb (104.3 kg)  12/16/19 230 lb 12.8 oz (104.7 kg)    Diabetic Foot Exam - Simple   No data filed    Lab Results  Component Value Date   WBC 7.6 04/20/2020   HGB 13.3 04/20/2020   HCT 42.2 04/20/2020   PLT 175.0 04/20/2020   GLUCOSE 158 (H) 04/20/2020   CHOL 153 04/20/2020   TRIG 113.0 04/20/2020   HDL 35.10 (L) 04/20/2020   LDLDIRECT 65.0 11/03/2018   LDLCALC 95 04/20/2020   ALT 17 04/20/2020   AST 20 04/20/2020   NA 136 04/20/2020   K 3.9 04/20/2020   CL 100 04/20/2020   CREATININE 1.89 (H) 04/20/2020   BUN 39 (H) 04/20/2020   CO2 26 04/20/2020   TSH 4.84 (H) 04/20/2020   PSA  1.18 10/07/2012   INR 1.0 08/15/2016   HGBA1C 7.0 (H) 04/20/2020   MICROALBUR 26.7 (H) 07/15/2016    Lab Results  Component Value Date   TSH 4.84 (H) 04/20/2020   Lab Results  Component Value Date   WBC 7.6 04/20/2020   HGB 13.3 04/20/2020   HCT 42.2 04/20/2020   MCV 85.1 04/20/2020   PLT 175.0 04/20/2020   Lab Results  Component Value Date   NA 136 04/20/2020   K 3.9 04/20/2020   CO2 26 04/20/2020   GLUCOSE 158 (H) 04/20/2020   BUN 39 (H) 04/20/2020   CREATININE 1.89 (H) 04/20/2020   BILITOT 1.0 04/20/2020   ALKPHOS 65 04/20/2020   AST 20 04/20/2020   ALT 17 04/20/2020   PROT 7.3 04/20/2020   ALBUMIN 4.1 04/20/2020   CALCIUM 10.1 04/20/2020   ANIONGAP 8 07/10/2016   GFR 32.15 (L) 04/20/2020   Lab Results  Component Value Date   CHOL 153 04/20/2020   Lab Results  Component Value Date   HDL 35.10 (L) 04/20/2020   Lab Results  Component Value Date   LDLCALC 95 04/20/2020   Lab Results  Component Value Date   TRIG 113.0 04/20/2020   Lab Results  Component Value Date   CHOLHDL 4 04/20/2020   Lab Results  Component Value Date   HGBA1C 7.0 (H) 04/20/2020       Assessment & Plan:   Problem List Items Addressed This Visit    Diabetes mellitus with chronic kidney disease ([place]) - Primary (Chronic)    hgba1c acceptable, minimize simple carbs. Increase exercise as  tolerated. Continue current meds      Relevant Orders   Hemoglobin A1c (Completed)   Hyperlipidemia LDL goal <70 (Chronic)    Encouraged heart healthy diet, increase exercise, avoid trans fats, consider a krill oil cap daily      Relevant Orders   Lipid panel (Completed)   Essential hypertension    Well controlled, no changes to meds. Encouraged heart healthy diet such as the DASH diet and exercise as tolerated.       Relevant Orders   CBC (Completed)   Comprehensive metabolic panel (Completed)   TSH (Completed)   Diabetes mellitus type 2 in obese (HCC)   Relevant Orders    Hemoglobin A1c (Completed)   Vitamin D deficiency    Supplement and monitor      Relevant Orders   VITAMIN D 25 Hydroxy (Vit-D Deficiency, Fractures) (Completed)   Persistent atrial fibrillation ([place])    Rate controlled and tolerating Eliquis         I have discontinued Ewart C. Roback "Bob"'s atorvastatin. I am also having him maintain his calcium carbonate, glucose blood, triamterene-hydrochlorothiazide, furosemide, Eliquis, amLODipine, metoprolol succinate, Januvia, and glipiZIDE.  No orders of the defined types were placed in this encounter.    [name], MD

## 2020-05-01 DIAGNOSIS — C441122 Basal cell carcinoma of skin of right lower eyelid, including canthus: Secondary | ICD-10-CM | POA: Diagnosis not present

## 2020-05-01 DIAGNOSIS — Z85828 Personal history of other malignant neoplasm of skin: Secondary | ICD-10-CM | POA: Diagnosis not present

## 2020-05-10 ENCOUNTER — Other Ambulatory Visit: Payer: Self-pay

## 2020-05-10 ENCOUNTER — Encounter: Payer: Self-pay | Admitting: Podiatry

## 2020-05-10 ENCOUNTER — Ambulatory Visit: Payer: PPO | Admitting: Podiatry

## 2020-05-10 DIAGNOSIS — M79675 Pain in left toe(s): Secondary | ICD-10-CM

## 2020-05-10 DIAGNOSIS — M79674 Pain in right toe(s): Secondary | ICD-10-CM

## 2020-05-10 DIAGNOSIS — B351 Tinea unguium: Secondary | ICD-10-CM | POA: Diagnosis not present

## 2020-05-10 DIAGNOSIS — N183 Chronic kidney disease, stage 3 unspecified: Secondary | ICD-10-CM

## 2020-05-10 DIAGNOSIS — E0822 Diabetes mellitus due to underlying condition with diabetic chronic kidney disease: Secondary | ICD-10-CM

## 2020-05-13 NOTE — Progress Notes (Signed)
Subjective: Vincent Allen presents today for at risk foot care. Pt has h/o NIDDM with chronic kidney disease and painful thick toenails that are difficult to trim. Pain interferes with ambulation. Aggravating factors include wearing enclosed shoe gear. Pain is relieved with periodic professional debridement.Vincent Allen, Vincent Levan, MD is patient's PCP. Last visit was 04/20/2020.  He relates no new pedal concerns on today's visit. Last A1c was 7.0 on 04/20/2020.   Allergies  Allergen Reactions  . Penicillins Hives and Itching    Has patient had a PCN reaction causing immediate rash, facial/tongue/throat swelling, SOB or lightheadedness with hypotension:Yes Has patient had a PCN reaction causing severe rash involving mucus membranes or skin necrosis:No Has patient had a PCN reaction that required hospitalization:No Has patient had a PCN reaction occurring within the last 10 years:No If all of the above answers are "NO", then may proceed with Cephalosporin use.   . Verapamil     Junctional rhythm  . Influenza Vaccines Rash    tinnitus   Review of Systems: Negative except as noted in the HPI.  Objective: There were no vitals filed for this visit.  Vincent Allen is a pleasant 87 y.o. male in NAD. AAO X 3.  Vascular Examination: Capillary fill time to digits <3 seconds b/l lower extremities. Palpable pedal pulses b/l LE. Pedal hair absent. Lower extremity skin temperature gradient within normal limits. No pain with calf compression b/l. Nonpitting edema noted b/l lower extremities.  Dermatological Examination: Pedal skin with normal turgor, texture and tone bilaterally. No open wounds bilaterally. No interdigital macerations bilaterally. Toenails 1-5 b/l elongated, discolored, dystrophic, thickened, crumbly with subungual debris and tenderness to dorsal palpation.  Musculoskeletal Examination: Normal muscle strength 5/5 to all lower extremity muscle groups bilaterally. No pain crepitus  or joint limitation noted with ROM b/l. No gross bony deformities bilaterally. Utilizes cane for ambulation assistance.  Neurological Examination: Protective sensation intact 5/5 intact bilaterally with 10g monofilament b/l. Vibratory sensation intact b/l. Proprioception intact bilaterally.  Hemoglobin A1C Latest Ref Rng & Units 04/20/2020 12/16/2019 09/14/2019 06/10/2019  HGBA1C 4.6 - 6.5 % 7.0(H) 7.2(H) 7.7(H) 7.4(H)  Some recent data might be hidden   Assessment: 1. Pain due to onychomycosis of toenails of both feet   2. Diabetes mellitus due to underlying condition with stage 3 chronic kidney disease, unspecified whether long term insulin use, unspecified whether stage 3a or 3b CKD ([place])     Plan: -Examined patient. -Continue diabetic foot care principles. -Patient to continue soft, supportive shoe gear daily. -Toenails 1-5 b/l were debrided in length and girth with sterile nail nippers and dremel without iatrogenic bleeding.  -Patient to report any pedal injuries to medical professional immediately. -Patient/POA to call should there be question/concern in the interim.  Return in about 3 months (around 08/07/2020).  Marzetta Board, DPM

## 2020-05-22 ENCOUNTER — Other Ambulatory Visit: Payer: Self-pay | Admitting: Family Medicine

## 2020-05-26 ENCOUNTER — Other Ambulatory Visit: Payer: Self-pay | Admitting: Family Medicine

## 2020-05-29 DIAGNOSIS — Z961 Presence of intraocular lens: Secondary | ICD-10-CM | POA: Diagnosis not present

## 2020-05-29 DIAGNOSIS — E119 Type 2 diabetes mellitus without complications: Secondary | ICD-10-CM | POA: Diagnosis not present

## 2020-05-29 DIAGNOSIS — H04123 Dry eye syndrome of bilateral lacrimal glands: Secondary | ICD-10-CM | POA: Diagnosis not present

## 2020-05-29 DIAGNOSIS — H02834 Dermatochalasis of left upper eyelid: Secondary | ICD-10-CM | POA: Diagnosis not present

## 2020-05-29 DIAGNOSIS — H26491 Other secondary cataract, right eye: Secondary | ICD-10-CM | POA: Diagnosis not present

## 2020-05-29 DIAGNOSIS — H35371 Puckering of macula, right eye: Secondary | ICD-10-CM | POA: Diagnosis not present

## 2020-05-29 DIAGNOSIS — H353131 Nonexudative age-related macular degeneration, bilateral, early dry stage: Secondary | ICD-10-CM | POA: Diagnosis not present

## 2020-05-29 DIAGNOSIS — H47323 Drusen of optic disc, bilateral: Secondary | ICD-10-CM | POA: Diagnosis not present

## 2020-05-29 DIAGNOSIS — H02831 Dermatochalasis of right upper eyelid: Secondary | ICD-10-CM | POA: Diagnosis not present

## 2020-05-29 LAB — HM DIABETES EYE EXAM

## 2020-06-27 ENCOUNTER — Other Ambulatory Visit: Payer: Self-pay | Admitting: Family Medicine

## 2020-06-28 ENCOUNTER — Ambulatory Visit (INDEPENDENT_AMBULATORY_CARE_PROVIDER_SITE_OTHER): Payer: PPO

## 2020-06-28 DIAGNOSIS — I495 Sick sinus syndrome: Secondary | ICD-10-CM

## 2020-06-29 LAB — CUP PACEART REMOTE DEVICE CHECK
Battery Remaining Longevity: 97 mo
Battery Voltage: 2.98 V
Brady Statistic RA Percent Paced: 2.11 %
Brady Statistic RV Percent Paced: 98.59 %
Date Time Interrogation Session: 20220413045932
Implantable Lead Implant Date: 20180706
Implantable Lead Implant Date: 20180706
Implantable Lead Location: 753859
Implantable Lead Location: 753860
Implantable Lead Model: 5076
Implantable Lead Model: 5076
Implantable Pulse Generator Implant Date: 20180706
Lead Channel Impedance Value: 304 Ohm
Lead Channel Impedance Value: 418 Ohm
Lead Channel Impedance Value: 475 Ohm
Lead Channel Impedance Value: 475 Ohm
Lead Channel Pacing Threshold Amplitude: 0.75 V
Lead Channel Pacing Threshold Pulse Width: 0.4 ms
Lead Channel Sensing Intrinsic Amplitude: 0.625 mV
Lead Channel Sensing Intrinsic Amplitude: 0.625 mV
Lead Channel Sensing Intrinsic Amplitude: 8 mV
Lead Channel Sensing Intrinsic Amplitude: 8 mV
Lead Channel Setting Pacing Amplitude: 2 V
Lead Channel Setting Pacing Amplitude: 2.5 V
Lead Channel Setting Pacing Pulse Width: 0.4 ms
Lead Channel Setting Sensing Sensitivity: 2.8 mV

## 2020-07-13 ENCOUNTER — Ambulatory Visit (INDEPENDENT_AMBULATORY_CARE_PROVIDER_SITE_OTHER): Payer: PPO | Admitting: Pharmacist

## 2020-07-13 DIAGNOSIS — N183 Chronic kidney disease, stage 3 unspecified: Secondary | ICD-10-CM | POA: Diagnosis not present

## 2020-07-13 DIAGNOSIS — I5032 Chronic diastolic (congestive) heart failure: Secondary | ICD-10-CM

## 2020-07-13 DIAGNOSIS — I4819 Other persistent atrial fibrillation: Secondary | ICD-10-CM | POA: Diagnosis not present

## 2020-07-13 DIAGNOSIS — E1169 Type 2 diabetes mellitus with other specified complication: Secondary | ICD-10-CM

## 2020-07-13 DIAGNOSIS — E669 Obesity, unspecified: Secondary | ICD-10-CM

## 2020-07-13 DIAGNOSIS — M85852 Other specified disorders of bone density and structure, left thigh: Secondary | ICD-10-CM

## 2020-07-13 DIAGNOSIS — M85851 Other specified disorders of bone density and structure, right thigh: Secondary | ICD-10-CM

## 2020-07-13 DIAGNOSIS — I10 Essential (primary) hypertension: Secondary | ICD-10-CM | POA: Diagnosis not present

## 2020-07-13 DIAGNOSIS — E785 Hyperlipidemia, unspecified: Secondary | ICD-10-CM | POA: Diagnosis not present

## 2020-07-13 NOTE — Chronic Care Management (AMB) (Addendum)
Chronic Care Management Pharmacy Note  07/13/2020 Name:  Vincent Allen MRN:  [id number] DOB:  1933/07/13  Subjective: Vincent Allen is an 87 y.o. year old male who is a primary patient of [name], MD.  The CCM team was consulted for assistance with disease management and care coordination needs.    Engaged with patient by telephone for follow up visit in response to provider referral for pharmacy case management and/or care coordination services.   Consent to Services:  The patient was given information about Chronic Care Management services, agreed to services, and gave verbal consent prior to initiation of services.  Please see initial visit note for detailed documentation.   Patient Care Team: [name], MD as PCP - General (Family Medicine) Debara Pickett Nadean Corwin, MD as PCP - Cardiology (Cardiology) Clent Jacks, MD as Consulting Physician (Ophthalmology) Danella Sensing, MD as Consulting Physician (Dermatology) Dorothy Spark, MD as Consulting Physician (Cardiology) [name], MD as Consulting Physician (Orthopedic Surgery) Regal, Tamala Fothergill, DPM as Consulting Physician (Podiatry) Cherre Robins, PharmD (Pharmacist)  Recent office visits: 04/20/2020 - PCP (Dr [name]): F/U chronic medical concerns; labs checked; no medication changes  Recent consult visits: 05/10/2020 - Podiatry (Dr Elisha Ponder) - at risk foot care. No medication changes.  05/01/2020 - Dermatology (Dr Sarajane Jews) - unable to see records but looks like doxycycline 166m bid was prescribed for 5 days  04/13/2020 - Dermatology (Dr JRonnald Ramp - unable to see records  Hospital visits: None in previous 6 months  Objective:  Lab Results  Component Value Date   CREATININE 1.89 (H) 04/20/2020   CREATININE 1.60 (H) 02/03/2020   CREATININE 1.74 (H) 12/16/2019    Lab Results  Component Value Date   HGBA1C 7.0 (H) 04/20/2020   Last diabetic Eye exam:  Lab Results  Component Value Date/Time    HMDIABEYEEXA No Retinopathy 05/26/2019 12:00 AM    Last diabetic Foot exam: No results found for: HMDIABFOOTEX      Component Value Date/Time   CHOL 153 04/20/2020 1106   TRIG 113.0 04/20/2020 1106   HDL 35.10 (L) 04/20/2020 1106   CHOLHDL 4 04/20/2020 1106   VLDL 22.6 04/20/2020 1106   LDLCALC 95 04/20/2020 1106   LDLCALC 101 (H) 12/16/2019 1543   LDLDIRECT 65.0 11/03/2018 1038    Hepatic Function Latest Ref Rng & Units 04/20/2020 02/03/2020 12/16/2019  Total Protein 6.0 - 8.3 g/dL 7.3 6.8 6.9  Albumin 3.5 - 5.2 g/dL 4.1 3.9 -  AST 0 - 37 U/L _0 ALT 0 - 53 U/L _1 Alk Phosphatase 39 - 117 U/L 65 54 -  Total Bilirubin 0.2 - 1.2 mg/dL 1.0 1.2 0.8  Bilirubin, Direct 0.0 - 0.3 mg/dL - - -    Lab Results  Component Value Date/Time   TSH 4.84 (H) 04/20/2020 11:06 AM   TSH 4.49 12/16/2019 03:43 PM    CBC Latest Ref Rng & Units 04/20/2020 12/16/2019 09/14/2019  WBC 4.0 - 10.5 K/uL 7.6 9.0 8.3  Hemoglobin 13.0 - 17.0 g/dL 13.3 15.2 14.6  Hematocrit 39.0 - 52.0 % 42.2 45.9 44.9  Platelets 150.0 - 400.0 K/uL 175.0 198 154.0    Lab Results  Component Value Date/Time   VD25OH 74.75 04/20/2020 11:06 AM   VD25OH 63 12/16/2019 03:43 PM   VD25OH 60.29 09/14/2019 10:39 AM    Clinical ASCVD: Yes  The ASCVD Risk score (Mikey BussingDC Jr., et al., 2013) failed to calculate for the  following reasons:   The 2013 ASCVD risk score is only valid for ages 37 to 16    Other: (CHADS2VASc if Afib, PHQ9 if depression, MMRC or CAT for COPD, ACT, DEXA)  Social History   Tobacco Use  Smoking Status Never Smoker  Smokeless Tobacco Never Used   BP Readings from Last 3 Encounters:  04/20/20 138/88  03/24/20 129/72  12/16/19 129/72   Pulse Readings from Last 3 Encounters:  04/20/20 92  03/24/20 79  12/16/19 79   Wt Readings from Last 3 Encounters:  04/20/20 236 lb 3.2 oz (107.1 kg)  03/24/20 230 lb (104.3 kg)  12/16/19 230 lb 12.8 oz (104.7 kg)    Assessment: Review of patient  past medical history, allergies, medications, health status, including review of consultants reports, laboratory and other test data, was performed as part of comprehensive evaluation and provision of chronic care management services.   SDOH:  (Social Determinants of Health) assessments and interventions performed:  SDOH Interventions   Flowsheet Row Most Recent Value  SDOH Interventions   Physical Activity Interventions Other (Comments)  [activity limited by instable gait]  Transportation Interventions Intervention Not Indicated      CCM Care Plan  Allergies  Allergen Reactions  . Atorvastatin     Myalgias / leg pain and weakness  . Penicillins Hives and Itching    Has patient had a PCN reaction causing immediate rash, facial/tongue/throat swelling, SOB or lightheadedness with hypotension:Yes Has patient had a PCN reaction causing severe rash involving mucus membranes or skin necrosis:No Has patient had a PCN reaction that required hospitalization:No Has patient had a PCN reaction occurring within the last 10 years:No If all of the above answers are "NO", then may proceed with Cephalosporin use.   . Verapamil     Junctional rhythm  . Influenza Vaccines Rash    tinnitus    Medications Reviewed Today    Reviewed by Cherre Robins, PharmD (Pharmacist) on 07/13/20 at 1135  Med List Status: <None>  Medication Order Taking? Sig Documenting Provider Last Dose Status Informant  amLODipine (NORVASC) 5 MG tablet [id number] Yes TAKE 1 TABLET BY MOUTH EVERY DAY [name], MD Taking Active   Calcium 600 MG tablet [id number] Yes Take 1 tablet (600 mg total) by mouth 2 (two) times daily. Burnice Logan, MD Taking Active Self  ELIQUIS 2.5 MG TABS tablet [id number] Yes TAKE 1 TABLET BY MOUTH TWICE A DAY [name], MD Taking Active   furosemide (LASIX) 20 MG tablet [id number] Yes TAKE 1 TABLET BY MOUTH DAILY AS NEEDED FOR EDEMA (WEIGHT GAIN >3LBS IN 24 HRS, SOB). [name], MD Taking  Active   glipiZIDE (GLUCOTROL) 10 MG tablet [id number] Yes TAKE 2 TABLETS (20 MG TOTAL) BY MOUTH 2 (TWO) TIMES DAILY BEFORE A MEAL. [name], MD Taking Active   glucose blood (ONE TOUCH ULTRA TEST) test strip [id number] Yes USE 1 STRIP 2 TIMES DAILY TO CHECK BLOOD SUGAR DX E08.22, N18.3 [name], MD Taking Active   JANUVIA 100 MG tablet [id number] Yes Take 1 tablet (100 mg total) by mouth daily. [name], MD Taking Active   Krill Oil 1000 MG CAPS [id number] Yes Take 1 capsule by mouth daily. [provider] Taking Active   metoprolol succinate (TOPROL-XL) 25 MG 24 hr tablet [id number] Yes Take 1 tablet (25 mg total) by mouth daily. [name], MD Taking Active   triamterene-hydrochlorothiazide [facility]) 75-50 MG tablet [id number] Yes TAKE 1 TABLET BY  MOUTH EVERY DAY [name], MD Taking Active   Vitamin D, Cholecalciferol, 25 MCG (1000 UT) CAPS [id number] Yes Take 1 capsule by mouth daily. [provider] Taking Active   Zinc 50 MG TABS [id number] Yes Take 1 tablet by mouth daily. [provider] Taking Active           Patient Active Problem List   Diagnosis Date Noted  . Knee pain, bilateral 09/20/2019  . Morbid obesity ([place]) 09/14/2019  . Urinary frequency 11/06/2016  . S/P placement of cardiac pacemaker 10/24/2016  . Chronic diastolic CHF (congestive heart failure) ([place]) 10/24/2016  . Cardiac device in situ   . Debility 08/15/2016  . Persistent atrial fibrillation ([place]) 07/09/2016  . Vitamin D deficiency 07/17/2015  . Preventative health care 07/17/2015  . Lumbago 12/02/2014  . Pain of toe of left foot 03/14/2014  . Diabetes mellitus type 2 in obese ([place]) 12/12/2013  . Medicare annual wellness visit, subsequent 01/17/2013  . Essential hypertension 01/13/2013  . Chronic kidney disease stage III (GFR 30-59 ml/min) 05/03/2012  . Insomnia 06/27/2011  . Osteopenia 09/02/2010  . Family history of colon cancer 09/02/2010  . Diabetes  mellitus with chronic kidney disease ([place])   . Hypertensive heart disease   . Hyperlipidemia LDL goal <70   . Carotid artery disease ([place])   . GERD (gastroesophageal reflux disease)   . CTS (carpal tunnel syndrome) 08/28/2010    Immunization History  Administered Date(s) Administered  . Pneumococcal Conjugate-13 03/23/2015  . Pneumococcal Polysaccharide-23 06/27/2011  . Tdap 05/19/2014    Conditions to be addressed/monitored: Atrial Fibrillation, CHF, CAD, HTN, HLD, DMII and osteopenia; CKD - stage 3b  Care Plan : General Pharmacy (Adult)  Updates made by Cherre Robins, PHARMD since 07/13/2020 12:00 AM    Problem: CHL AMB "PATIENT-SPECIFIC PROBLEM"   Priority: High  Onset Date: 07/13/2020  Note:   CARE PLAN ENTRY (see longitudinal plan of care for additional care plan information)  Current Barriers:  . Chronic Disease Management support, education, and care coordination needs related to Hypertension, Hyperlipidemia, Diabetes, atrial fibrillation, Heart Failure, CKD - 3   Hypertension / High Blood pressure BP Readings from Last 3 Encounters:  04/20/20 138/88  03/24/20 129/72  12/16/19 129/72   .  BP goal <140/90 - Controlled . Current regimen:  . Amlodipine 68m daily in morning . Triamterene-hctz 75-560mdaily in morning . Patient self care activities - Over the next 90 days, patient will: o Maintain hypertension medication regimen.   Hyperlipidemia Lab Results  Component Value Date/Time   LDLCALC 95 04/20/2020 11:06 AM   LDLCALC 101 (H) 12/16/2019 03:43 PM   LDLDIRECT 65.0 11/03/2018 10:38 AM   .  LDL goal < 70 - not currently at goal . Tried atorvastatin in past - caused leg pain and weakness . Current regimen:  o Diet and exercise management   o Krill Oil 100074maily . Interventions: o Consider retrial of alternative statin like  rosuvastatin 5mg58mily  o Recommend limiting intake of food high in saturated fat / avoid fried foods; increase intake of  vegetables o Continue Krill Oil  Diabetes Lab Results  Component Value Date/Time   HGBA1C 7.0 (H) 04/20/2020 11:06 AM   HGBA1C 7.2 (H) 12/16/2019 03:43 PM   . Current regimen:  . Januvia 100mg37mablet daily in morning . Glipizide 10mg 52mke 2 tablets with morning meal and 2 tablets with evening meal . Glucocil - take 2 tablets twice a day .  Checks BG daily  . Recent home BG readings: 95, 87, 156, 116, 137, 157, 147, 87, 125, 116, 117, 131, 133, 125 . Denies hypoglycemic events or symptoms. . Interventions: o Mailed PAP for Januvia o Recommended checking blood glucose / sugar daily o Consider less aggressive A1c goal of <7.5% given patient's age and CVD history.  o Reviewed recent Scr / eGFR which was 1.89 / 32 (04/20/2020) - Recommend either decrease Januvia dose to 75m  daily due to eGFR 30 to 45 mL/min or could consider SGLT2 / Jardiance or GLP1 / Ozempic o Reviewed home blood glucose readings and reviewed goals  - Fasting blood glucose goal (before meals) = 80 to 130 - Blood glucose goal after a meal = less than 180    Chronic Heart Failure:  . Current Regimen:  o Furosemide 261mdaily if needed for weight gain of 3lbs on 24 hours or for shortness of breath o Metoprolol succinate ER 2553maily . Patient has scales at home but only checks weight 2 or 3 times per week . Interventions:  o Discussed importance of checking weight daily  o Reviewed when to take as needed furosemide   Atrial fibrillation  . Current regimen:   Eliquis 2.5mg59mice daily in morning and with dinner  Metoprolol succinate 25mg71mly in morning . Interventions: o Recommended patient check in with his pharmacy to see if he has met 3% OOP spend for Eliquis patient assistance application o Maintain current atrial fibrillation medication regimen o Notify providers once 3% out of pocket has been met - bring in documentation of 2022 medication costs from pharmacy  Decreased Kidney Function:  BMET     Component Value Date/Time   NA 136 04/20/2020 1106   K 3.9 04/20/2020 1106   CL 100 04/20/2020 1106   CO2 26 04/20/2020 1106   GLUCOSE 158 (H) 04/20/2020 1106   BUN 39 (H) 04/20/2020 1106   CREATININE 1.89 (H) 04/20/2020 1106   CREATININE 1.74 (H) 12/16/2019 1543   CALCIUM 10.1 04/20/2020 1106   GFRNONAA 32 (L) 04/20/2020 1106    . Current regimen:   none . Interventions: o Discussed that Dr BlythCharlett Blakesuggested referral to nephrologist after last labs. Patient is agreeable o Sent message to Dr BlythFrederik Pearical team to complete referral to nephrologist o Recommend adjust dose of Januvia to 50mg 14my or consider SGLT2 / GLP1 due to recent renal function change. o Avoid over the counter pain relievers like Aleve / naproxen; Motrin / ibuprofen. o If need for pain relief, headaches or fever - Tylenol / acetaminophen 500mg u39m 2 tablets every 8 hours is safer option.    Osteopenia:  . Last DEXA 12/2019 o Lowest T-Score = -2.4 at left femur neck ;  o Right femur neck = -1.4 . History of fracture foot and shoulder. Per patient sustained from fall from standing position  Fall Risk  07/13/2020 10/06/2019 09/14/2019 02/10/2019 12/18/2017  Falls in the past year? 0 0 0 0 No  Comment - - - Emmi Telephone Survey: data to providers prior to load -  Number falls in past yr: 0 0 - - -  Injury with Fall? 0 0 - - -  Comment - - - - -  Risk for fall due to : History of fall(s);Impaired balance/gait - - - -  Follow up Falls prevention discussed;Falls evaluation completed Education provided;Falls prevention discussed - - - .  Interventions:  o Continue to take calcium + D dail  o Discussed fall prevention o Continue to use cane for ambulation assistance o Consider recheck DEXA   Medication management . Pharmacist Clinical Goal(s): o Over the next 90 days, patient will work with PharmD and providers to achieve optimal medication adherence . Current [facility] . Interventions o Comprehensive medication review performed. o Continue current medication management strategy . Patient self care activities - Over the next 90 days, patient will: o Focus on medication adherence by filling and taking medications appropriately  o Take medications as prescribed o Report any questions or concerns to PharmD and/or provider(s)  Please see past updates related to this goal by clicking on the "Past Updates" button in the selected goal      Medication Assistance: Application for Eliquis  medication assistance program. in process.  Anticipated assistance start date 09/15/2020.  See plan of care for additional detail. (patient has to spend $600 out of pocket to complete application process) Also will apply or Januvia patient assistance  Patient's preferred pharmacy is:  [id number] [place], [place] - [address] [place] [id number]: [phone]: [phone]  Follow Up:  Patient agrees to Care Plan and Follow-up.  Plan: Mailed patient application for Januvia   Consider lowering dose of Januvia to 519mdaily in light of recent decreased in eGFR or change to alterntive therapy like SGLT2 / Jardiance or Ozempic  Message sent to clinical team to complete referral for nephrology per Dr BlFrederik Pearabs notes from last CMP  Fall prevention discuss - consider recheck DEXA    Telephone follow up appointment with care management team member scheduled for:  2 months  TaCherre RobinsPharmD Clinical Pharmacist [facility]

## 2020-07-13 NOTE — Progress Notes (Signed)
Remote pacemaker transmission.   

## 2020-07-13 NOTE — Patient Instructions (Signed)
Visit Information Vincent Allen,   It was a pleasure speaking with you today. Please feel free to contact me if you have any questions or concerns. Below is information regarding you health goals.  I have included the application for Januvia assistance - please complete the first page area highlight and either mail back to the office or bring to your next appointment.    Cherre Robins, PharmD Clinical Pharmacist [facility] [place] [facility] [phone]  PATIENT GOALS: Goals Addressed            This Visit's Progress   . Chronic Care Management Pharmacy Care Plan   On track    CARE PLAN ENTRY (see longitudinal plan of care for additional care plan information)  Current Barriers:  . Chronic Disease Management support, education, and care coordination needs related to Hypertension, Hyperlipidemia, Diabetes, atrial fibrillation, Heart Failure, decrease kidney function   Hypertension / High Blood pressure BP Readings from Last 3 Encounters:  04/20/20 138/88  03/24/20 129/72  12/16/19 129/72   . Pharmacist Clinical Goal(s): o Over the next 90 days, patient will work with PharmD and providers to maintain BP goal <140/90 . Current regimen:  . Amlodipine 105m daily in morning . Triamterene-hctz 75-571mdaily in morning . Patient self care activities - Over the next 90 days, patient will: o Maintain hypertension medication regimen.   Hyperlipidemia Lab Results  Component Value Date/Time   LDLCALC 95 04/20/2020 11:06 AM   LDLCALC 101 (H) 12/16/2019 03:43 PM   LDLDIRECT 65.0 11/03/2018 10:38 AM   . Pharmacist Clinical Goal(s): o Over the next 90 days, patient will work with PharmD and providers to achieve LDL goal < 70 . Current regimen:  o Diet and exercise management   o Krill Oil 100057maily . Interventions: o Discussed past experiences with atorvastatin - caused leg pain and weakness o Recommend limiting intake of food high in saturated fat / avoid fried  foods; increase intake of vegetables . Patient self care activities - Over the next 90 days, patient will: o Achieve LDL <100 o Continue Krill Oil  Diabetes Lab Results  Component Value Date/Time   HGBA1C 7.0 (H) 04/20/2020 11:06 AM   HGBA1C 7.2 (H) 12/16/2019 03:43 PM   . Pharmacist Clinical Goal(s): o Over the next 90 days, patient will work with PharmD and providers to achieve A1c goal <7% . Current regimen:  . Januvia 100m13mtablet daily in morning . Glipizide 10mg20make 2 tablets with morning meal and 2 tablets with evening meal . Glucocil - take 2 tablets twice a day . Interventions: o Mailed PAP for Januvia o Recommended checking blood glucose / sugar daily o Reviewed home blood glucose readings and reviewed goals  - Fasting blood glucose goal (before meals) = 80 to 130 - Blood glucose goal after a meal = less than 180  . Patient self care activities - Over the next 90 days, patient will: o Check blood sugar once daily, document, and provide at future appointments o Contact provider with any episodes of hypoglycemia o Complete application for Januvia patient assistance and return to office  Atrial fibrillation  . Pharmacist Clinical Goal(s) o Over the next 90 days, patient will work with PharmD and providers to reduce complications associated with atrial fibrillation . Current regimen:   Eliquis 2.5mg t38me daily in morning and with dinner  Metoprolol succinate 25mg d70m in morning . Interventions: o Recommended patient check in with his pharmacy to see if he  has met 3% OOP spend for Eliquis patient assistance application . Patient self care activities - Over the next 90 days, patient will: o Maintain current atrial fibrillation medication regimen o Notify providers once 3% out of pocket has been met - bring in documentation of 2022 medication costs from pharmacy  Decreased Kidney Function:  . Pharmacist Clinical Goal(s) o Over the next 90 days, patient will work  with PharmD and providers to slow / prevent further decrease in kidney function . Current regimen:   none . Interventions: o Discussed that Dr [name] has suggested referral to nephrologist after last labs. Patient is agreeable o Sent message to Dr Frederik Pear clinical team to complete referral to nephrologist . Patient self care activities - Over the next 90 days, patient will: o Avoid over the counter pain relievers like Aleve / naproxen; Motrin / ibuprofen. o If you need anything for pain relief, headaches or fever - Tylenol / acetaminophen 547m up to 2 tablets every 8 hours is safer option.  o See nephrologist / kidney specialist  Medication management . Pharmacist Clinical Goal(s): o Over the next 90 days, patient will work with PharmD and providers to achieve optimal medication adherence . Current pharmacy: [facility] . Interventions o Comprehensive medication review performed. o Continue current medication management strategy . Patient self care activities - Over the next 90 days, patient will: o Focus on medication adherence by filling and taking medications appropriately  o Take medications as prescribed o Report any questions or concerns to PharmD and/or provider(s)  Please see past updates related to this goal by clicking on the "Past Updates" button in the selected goal         The patient verbalized understanding of instructions, educational materials, and care plan provided today and agreed to receive a mailed copy of patient instructions, educational materials, and care plan.   Telephone follow up appointment with care management team member scheduled for: 2 months   Fall Prevention in the Home, Adult Falls can cause injuries and can affect people from all age groups. There are many simple things that you can do to make your home safe and to help prevent falls. Ask for help when making these changes, if needed. What actions can I take to prevent falls? General  instructions  Use good lighting in all rooms. Replace any light bulbs that burn out.  Turn on lights if it is dark. Use night-lights.  Place frequently used items in easy-to-reach places. Lower the shelves around your home if necessary.  Set up furniture so that there are clear paths around it. Avoid moving your furniture around.  Remove throw rugs and other tripping hazards from the floor.  Avoid walking on wet floors.  Fix any uneven floor surfaces.  Add color or contrast paint or tape to grab bars and handrails in your home. Place contrasting color strips on the first and last steps of stairways.  When you use a stepladder, make sure that it is completely opened and that the sides are firmly locked. Have someone hold the ladder while you are using it. Do not climb a closed stepladder.  Be aware of any and all pets. What can I do in the bathroom?  Keep the floor dry. Immediately clean up any water that spills onto the floor.  Remove soap buildup in the tub or shower on a regular basis.  Use non-skid mats or decals on the floor of the tub or shower.  Attach bath mats securely with double-sided,  non-slip rug tape.  If you need to sit down while you are in the shower, use a plastic, non-slip stool.  Install grab bars by the toilet and in the tub and shower. Do not use towel bars as grab bars.      What can I do in the bedroom?  Make sure that a bedside light is easy to reach.  Do not use oversized bedding that drapes onto the floor.  Have a firm chair that has side arms to use for getting dressed. What can I do in the kitchen?  Clean up any spills right away.  If you need to reach for something above you, use a sturdy step stool that has a grab bar.  Keep electrical cables out of the way.  Do not use floor polish or wax that makes floors slippery. If you must use wax, make sure that it is non-skid floor wax. What can I do in the stairways?  Do not leave any items  on the stairs.  Make sure that you have a light switch at the top of the stairs and the bottom of the stairs. Have them installed if you do not have them.  Make sure that there are handrails on both sides of the stairs. Fix handrails that are broken or loose. Make sure that handrails are as long as the stairways.  Install non-slip stair treads on all stairs in your home.  Avoid having throw rugs at the top or bottom of stairways, or secure the rugs with carpet tape to prevent them from moving.  Choose a carpet design that does not hide the edge of steps on the stairway.  Check any carpeting to make sure that it is firmly attached to the stairs. Fix any carpet that is loose or worn. What can I do on the outside of my home?  Use bright outdoor lighting.  Regularly repair the edges of walkways and driveways and fix any cracks.  Remove high doorway thresholds.  Trim any shrubbery on the main path into your home.  Regularly check that handrails are securely fastened and in good repair. Both sides of any steps should have handrails.  Install guardrails along the edges of any raised decks or porches.  Clear walkways of debris and clutter, including tools and rocks.  Have leaves, snow, and ice cleared regularly.  Use sand or salt on walkways during winter months.  In the garage, clean up any spills right away, including grease or oil spills. What other actions can I take?  Wear closed-toe shoes that fit well and support your feet. Wear shoes that have rubber soles or low heels.  Use mobility aids as needed, such as canes, walkers, scooters, and crutches.  Review your medicines with your health care provider. Some medicines can cause dizziness or changes in blood pressure, which increase your risk of falling. Talk with your health care provider about other ways that you can decrease your risk of falls. This may include working with a physical therapist or trainer to improve your  strength, balance, and endurance. Where to find more information  Centers for Disease Control and Prevention, STEADI: WebmailGuide.co.za  [employer] on Aging: BrainJudge.co.uk Contact a health care provider if:  You are afraid of falling at home.  You feel weak, drowsy, or dizzy at home.  You fall at home. Summary  There are many simple things that you can do to make your home safe and to help prevent falls.  Ways to make  your home safe include removing tripping hazards and installing grab bars in the bathroom.  Ask for help when making these changes in your home. This information is not intended to replace advice given to you by your health care provider. Make sure you discuss any questions you have with your health care provider. Document Revised: 02/14/2017 Document Reviewed: 10/17/2016 Elsevier Patient Education  2021 [employer].

## 2020-07-14 ENCOUNTER — Other Ambulatory Visit: Payer: Self-pay | Admitting: Family Medicine

## 2020-07-18 ENCOUNTER — Other Ambulatory Visit: Payer: Self-pay

## 2020-07-18 MED ORDER — SITAGLIPTIN PHOSPHATE 50 MG PO TABS: 50.0000 mg | ORAL_TABLET | Freq: Every day | ORAL | 3 refills | Status: DC

## 2020-08-10 ENCOUNTER — Other Ambulatory Visit: Payer: Self-pay | Admitting: Family Medicine

## 2020-08-10 ENCOUNTER — Other Ambulatory Visit: Payer: Self-pay

## 2020-08-10 ENCOUNTER — Ambulatory Visit (INDEPENDENT_AMBULATORY_CARE_PROVIDER_SITE_OTHER): Payer: PPO | Admitting: Family Medicine

## 2020-08-10 VITALS — BP 143/78 | HR 79 | Temp 97.6°F | Resp 20 | Ht 72.0 in | Wt 235.2 lb

## 2020-08-10 DIAGNOSIS — E559 Vitamin D deficiency, unspecified: Secondary | ICD-10-CM

## 2020-08-10 DIAGNOSIS — I5032 Chronic diastolic (congestive) heart failure: Secondary | ICD-10-CM

## 2020-08-10 DIAGNOSIS — R350 Frequency of micturition: Secondary | ICD-10-CM | POA: Diagnosis not present

## 2020-08-10 DIAGNOSIS — N183 Chronic kidney disease, stage 3 unspecified: Secondary | ICD-10-CM | POA: Diagnosis not present

## 2020-08-10 DIAGNOSIS — Z0000 Encounter for general adult medical examination without abnormal findings: Secondary | ICD-10-CM

## 2020-08-10 DIAGNOSIS — E1169 Type 2 diabetes mellitus with other specified complication: Secondary | ICD-10-CM

## 2020-08-10 DIAGNOSIS — I4819 Other persistent atrial fibrillation: Secondary | ICD-10-CM

## 2020-08-10 DIAGNOSIS — E0822 Diabetes mellitus due to underlying condition with diabetic chronic kidney disease: Secondary | ICD-10-CM

## 2020-08-10 DIAGNOSIS — I10 Essential (primary) hypertension: Secondary | ICD-10-CM

## 2020-08-10 DIAGNOSIS — R600 Localized edema: Secondary | ICD-10-CM

## 2020-08-10 DIAGNOSIS — R0600 Dyspnea, unspecified: Secondary | ICD-10-CM | POA: Diagnosis not present

## 2020-08-10 DIAGNOSIS — I779 Disorder of arteries and arterioles, unspecified: Secondary | ICD-10-CM

## 2020-08-10 DIAGNOSIS — E785 Hyperlipidemia, unspecified: Secondary | ICD-10-CM | POA: Diagnosis not present

## 2020-08-10 DIAGNOSIS — D689 Coagulation defect, unspecified: Secondary | ICD-10-CM | POA: Insufficient documentation

## 2020-08-10 DIAGNOSIS — E669 Obesity, unspecified: Secondary | ICD-10-CM | POA: Diagnosis not present

## 2020-08-10 DIAGNOSIS — E119 Type 2 diabetes mellitus without complications: Secondary | ICD-10-CM

## 2020-08-10 NOTE — Progress Notes (Signed)
Patient ID: Vincent Allen, male    DOB: 08/11/1933  Age: 87 y.o. MRN: [id number]    Subjective:  Subjective  HPI Vincent Allen presents for comprehensive physical exam today and follow up on management of chronic concerns of bilateral LE swelling and having low energy. He reports that he has been feeling SOB and LE bilateral swelling that has been happening intermittently. He states that has trouble bending his knees due to his LE's swelling. He states that his SOB is starting to get worse and states that he experiences it when walking or doing physical activity, but not when sitting or resting. He reports that his Lasix helps relief the symptoms of LE swelling. He denies any chest pain, fever, abdominal pain, cough, chills, sore throat, dysuria, urinary incontinence, back pain, HA, or N/VD. He reports that he experiences knee joint swelling that has been flaring up recently. He endorses being well hydrated and having a healthy diet. He reports having ear pain and endorses decreased hear, but denies tinnitus.    Review of Systems  Constitutional: Negative for chills, fatigue and fever.       (+) low energy  HENT: Positive for ear pain. Negative for congestion, rhinorrhea, sinus pressure, sinus pain, sore throat and tinnitus.   Eyes: Negative for pain.  Respiratory: Positive for shortness of breath. Negative for cough.   Cardiovascular: Positive for leg swelling. Negative for chest pain and palpitations.  Gastrointestinal: Negative for abdominal pain, blood in stool, diarrhea, nausea and vomiting.  Genitourinary: Negative for flank pain, frequency and penile pain.  Musculoskeletal: Positive for joint swelling (bilateral knees). Negative for back pain.  Neurological: Negative for headaches.    History Past Medical History:  Diagnosis Date  . Arthritis   . Bradycardia   . Cataract   . Chronic kidney disease stage III (GFR 30-59 ml/min) 05/03/2012  . Colon polyps   . Complication of  anesthesia    emesis  . Diabetes mellitus   . GERD (gastroesophageal reflux disease)   . HOH (hard of hearing)   . HTN (hypertension) 01/13/2013  . Hyperlipidemia   . Hypertension   . Pacemaker 12/2016  . Persistent atrial fibrillation ([place])   . Presence of permanent cardiac pacemaker 09/20/2016  . Symptomatic bradycardia   . Vitamin D deficiency 07/17/2015    He has a past surgical history that includes Cataract extraction (2010, 2014); Rotator cuff repair; Ankle arthroplasty; Tonsillectomy; Cholecystectomy; Cholecystectomy (N/A, 05/03/2012); Cataract extraction (02/28/14); Shoulder surgery; Cardioversion (N/A, 08/20/2016); Insert / replace / remove pacemaker (09/20/2016); and PACEMAKER IMPLANT (N/A, 09/20/2016).   His family history includes Cancer in his mother and paternal grandmother; Diabetes in his father; Heart disease in his father and son; Obesity in his son; Stroke in his father.He reports that he has never smoked. He has never used smokeless tobacco. He reports current alcohol use of about 1.0 standard drink of alcohol per week. He reports that he does not use drugs.  Current Outpatient Medications on File Prior to Visit  Medication Sig Dispense Refill  . amLODipine (NORVASC) 5 MG tablet TAKE 1 TABLET BY MOUTH EVERY DAY 90 tablet 1  . Calcium 600 MG tablet Take 1 tablet (600 mg total) by mouth 2 (two) times daily. 60 tablet 0  . ELIQUIS 2.5 MG TABS tablet TAKE 1 TABLET BY MOUTH TWICE A DAY 60 tablet 5  . glipiZIDE (GLUCOTROL) 10 MG tablet TAKE 2 TABLETS (20 MG TOTAL) BY MOUTH 2 (TWO) TIMES DAILY BEFORE A MEAL. [place]  tablet 1  . glucose blood (ONE TOUCH ULTRA TEST) test strip USE 1 STRIP 2 TIMES DAILY TO CHECK BLOOD SUGAR DX E08.22, N18.3 300 each 1  . Krill Oil 1000 MG CAPS Take 1 capsule by mouth daily.    . metoprolol succinate (TOPROL-XL) 25 MG 24 hr tablet Take 1 tablet (25 mg total) by mouth daily. 90 tablet 1  . sitaGLIPtin (JANUVIA) 50 MG tablet Take 1 tablet (50 mg total) by  mouth daily. 30 tablet 3  . triamterene-hydrochlorothiazide (MAXZIDE) 75-50 MG tablet TAKE 1 TABLET BY MOUTH EVERY DAY 90 tablet 1  . Vitamin D, Cholecalciferol, 25 MCG (1000 UT) CAPS Take 1 capsule by mouth daily.    . Zinc 50 MG TABS Take 1 tablet by mouth daily.     No current facility-administered medications on file prior to visit.     Objective:  Objective  Physical Exam Constitutional:      General: He is not in acute distress.    Appearance: Normal appearance. He is not ill-appearing or toxic-appearing.  HENT:     Head: Normocephalic and atraumatic.     Right Ear: Tympanic membrane, ear canal and external ear normal.     Left Ear: Tympanic membrane, ear canal and external ear normal.     Nose: No congestion or rhinorrhea.  Eyes:     Extraocular Movements: Extraocular movements intact.     Right eye: No nystagmus.     Left eye: No nystagmus.     Pupils: Pupils are equal, round, and reactive to light.  Cardiovascular:     Rate and Rhythm: Normal rate and regular rhythm.     Pulses: Normal pulses.     Heart sounds: No murmur heard. Friction rub present.  Pulmonary:     Effort: Pulmonary effort is normal. No respiratory distress.     Breath sounds: Normal breath sounds. No wheezing, rhonchi or rales.  Abdominal:     General: Bowel sounds are normal.     Palpations: Abdomen is soft. There is no mass.     Tenderness: There is no abdominal tenderness. There is no guarding.     Hernia: No hernia is present.  Musculoskeletal:        General: Normal range of motion.     Cervical back: Normal range of motion and neck supple.  Skin:    General: Skin is warm and dry.  Neurological:     Mental Status: He is alert and oriented to person, place, and time.  Psychiatric:        Behavior: Behavior normal.    BP (!) 143/78   Pulse 79   Temp 97.6 F (36.4 C)   Resp 20   Ht 6' (1.829 m)   Wt 235 lb 3.2 oz (106.7 kg)   SpO2 99%   BMI 31.90 kg/m  Wt Readings from Last 3  Encounters:  08/10/20 235 lb 3.2 oz (106.7 kg)  04/20/20 236 lb 3.2 oz (107.1 kg)  03/24/20 230 lb (104.3 kg)     Lab Results  Component Value Date   WBC 8.2 08/10/2020   HGB 13.5 08/10/2020   HCT 41.6 08/10/2020   PLT 158.0 08/10/2020   GLUCOSE 100 (H) 08/10/2020   CHOL 145 08/10/2020   TRIG 98.0 08/10/2020   HDL 33.00 (L) 08/10/2020   LDLDIRECT 65.0 11/03/2018   LDLCALC 92 08/10/2020   ALT 24 08/10/2020   AST 32 08/10/2020   NA 136 08/10/2020   K 4.3 08/10/2020  CL 101 08/10/2020   CREATININE 1.67 (H) 08/10/2020   BUN 41 (H) 08/10/2020   CO2 23 08/10/2020   TSH 4.67 (H) 08/10/2020   PSA 1.18 10/07/2012   INR 1.0 08/15/2016   HGBA1C 6.8 (H) 08/10/2020   MICROALBUR 26.7 (H) 07/15/2016    DG Lumbar Spine Complete  Result Date: 11/13/2017 CLINICAL DATA:  Bilateral leg weakness. EXAM: LUMBAR SPINE - COMPLETE 4+ VIEW COMPARISON:  CT 04/22/2012. FINDINGS: Lumbar scoliosis and diffuse multilevel severe degenerative change. No acute abnormality. Aortoiliac atherosclerotic vascular calcification. Pelvic phleboliths. Surgical clips right upper quadrant. IMPRESSION: Lumbar scoliosis and diffuse multilevel severe degenerative change. No acute abnormality. Electronically Signed   By: Marcello Moores  Register   On: 11/13/2017 07:34     Assessment & Plan:  Plan    No orders of the defined types were placed in this encounter.   Problem List Items Addressed This Visit    Diabetes mellitus with chronic kidney disease (HCC) (Chronic)    hgba1c acceptable, minimize simple carbs. Increase exercise as tolerated. Continue current meds      Hyperlipidemia LDL goal <70 (Chronic)    Encouraged heart healthy diet, increase exercise, avoid trans fats, consider a krill oil cap daily      Relevant Orders   Lipid panel (Completed)   Carotid artery disease ([place]) (Chronic)   Essential hypertension    Well controlled, no changes to meds. Encouraged heart healthy diet such as the DASH diet and  exercise as tolerated.       Relevant Orders   CBC (Completed)   Comprehensive metabolic panel (Completed)   TSH (Completed)   RESOLVED: Diabetes mellitus type 2 in obese (HCC)   Relevant Orders   Hemoglobin A1c (Completed)   Vitamin D deficiency    Supplement and monitor      Relevant Orders   VITAMIN D 25 Hydroxy (Vit-D Deficiency, Fractures) (Completed)   Preventative health care    Patient encouraged to maintain heart healthy diet, regular exercise, adequate sleep. Consider daily probiotics. Take medications as prescribed. Labs ordered and reviewed. He has aged out of colonoscopy for screening purposes. He continues to decline Covid vaccination, he is encouraged to reconsider.       Persistent atrial fibrillation (HCC)    Rate controlled and tolerating anticoagulation      Chronic diastolic CHF (congestive heart failure) (HCC)   Urinary frequency   Relevant Orders   Urinalysis   Urine Culture (Completed)   Morbid obesity (HCC)   Pedal edema    Minimize salt, check echo. Elevate feet, try compression socks and increase Lasix x 3 days       Other Visit Diagnoses    Dyspnea, unspecified type    -  Primary   Relevant Orders   ECHOCARDIOGRAM COMPLETE      Follow-up: Return in about 2 months (around 10/10/2020).   I,David Hanna,acting as a scribe for [name], MD.,have documented all relevant documentation on the behalf of [name], MD,as directed by  [name], MD while in the presence of [name], MD.  I, [name], MD personally performed the services described in this documentation. All medical record entries made by the scribe were at my direction and in my presence. I have reviewed the chart and agree that the record reflects my personal performance and is accurate and complete

## 2020-08-10 NOTE — Patient Instructions (Signed)
Preventive Care 85 Years and Older, Male Preventive care refers to lifestyle choices and visits with your health care provider that can promote health and wellness. This includes:  A yearly physical exam. This is also called an annual wellness visit.  Regular dental and eye exams.  Immunizations.  Screening for certain conditions.  Healthy lifestyle choices, such as: ? Eating a healthy diet. ? Getting regular exercise. ? Not using drugs or products that contain nicotine and tobacco. ? Limiting alcohol use. What can I expect for my preventive care visit? Physical exam Your health care provider will check your:  Height and weight. These may be used to calculate your BMI (body mass index). BMI is a measurement that tells if you are at a healthy weight.  Heart rate and blood pressure.  Body temperature.  Skin for abnormal spots. Counseling Your health care provider may ask you questions about your:  Past medical problems.  Family's medical history.  Alcohol, tobacco, and drug use.  Emotional well-being.  Home life and relationship well-being.  Sexual activity.  Diet, exercise, and sleep habits.  History of falls.  Memory and ability to understand (cognition).  Work and work environment.  Access to firearms. What immunizations do I need? Vaccines are usually given at various ages, according to a schedule. Your health care provider will recommend vaccines for you based on your age, medical history, and lifestyle or other factors, such as travel or where you work.   What tests do I need? Blood tests  Lipid and cholesterol levels. These may be checked every 5 years, or more often depending on your overall health.  Hepatitis C test.  Hepatitis B test. Screening  Lung cancer screening. You may have this screening every year starting at age 55 if you have a 30-pack-year history of smoking and currently smoke or have quit within the past 15 years.  Colorectal  cancer screening. ? All adults should have this screening starting at age 50 and continuing until age 75. ? Your health care provider may recommend screening at age 45 if you are at increased risk. ? You will have tests every 1-10 years, depending on your results and the type of screening test.  Prostate cancer screening. Recommendations will vary depending on your family history and other risks.  Genital exam to check for testicular cancer or hernias.  Diabetes screening. ? This is done by checking your blood sugar (glucose) after you have not eaten for a while (fasting). ? You may have this done every 1-3 years.  Abdominal aortic aneurysm (AAA) screening. You may need this if you are a current or former smoker.  STD (sexually transmitted disease) testing, if you are at risk. Follow these instructions at home: Eating and drinking  Eat a diet that includes fresh fruits and vegetables, whole grains, lean protein, and low-fat dairy products. Limit your intake of foods with high amounts of sugar, saturated fats, and salt.  Take vitamin and mineral supplements as recommended by your health care provider.  Do not drink alcohol if your health care provider tells you not to drink.  If you drink alcohol: ? Limit how much you have to 0-2 drinks a day. ? Be aware of how much alcohol is in your drink. In the [place] one drink equals one 12 oz bottle of beer (355 mL), one 5 oz glass of wine (148 mL), or one 1 oz glass of hard liquor (44 mL).   Lifestyle  Take daily care of your teeth   and gums. Brush your teeth every morning and night with fluoride toothpaste. Floss one time each day.  Stay active. Exercise for at least 30 minutes 5 or more days each week.  Do not use any products that contain nicotine or tobacco, such as cigarettes, e-cigarettes, and chewing tobacco. If you need help quitting, ask your health care provider.  Do not use drugs.  If you are sexually active, practice safe sex.  Use a condom or other form of protection to prevent STIs (sexually transmitted infections).  Talk with your health care provider about taking a low-dose aspirin or statin.  Find healthy ways to cope with stress, such as: ? Meditation, yoga, or listening to music. ? Journaling. ? Talking to a trusted person. ? Spending time with friends and family. Safety  Always wear your seat belt while driving or riding in a vehicle.  Do not drive: ? If you have been drinking alcohol. Do not ride with someone who has been drinking. ? When you are tired or distracted. ? While texting.  Wear a helmet and other protective equipment during sports activities.  If you have firearms in your house, make sure you follow all gun safety procedures. What's next?  Visit your health care provider once a year for an annual wellness visit.  Ask your health care provider how often you should have your eyes and teeth checked.  Stay up to date on all vaccines. This information is not intended to replace advice given to you by your health care provider. Make sure you discuss any questions you have with your health care provider. Document Revised: 12/01/2018 Document Reviewed: 02/26/2018 Elsevier Patient Education  2021 [employer]  

## 2020-08-11 ENCOUNTER — Other Ambulatory Visit (INDEPENDENT_AMBULATORY_CARE_PROVIDER_SITE_OTHER): Payer: PPO

## 2020-08-11 ENCOUNTER — Encounter: Payer: Self-pay | Admitting: Family Medicine

## 2020-08-11 DIAGNOSIS — R17 Unspecified jaundice: Secondary | ICD-10-CM

## 2020-08-11 DIAGNOSIS — R7989 Other specified abnormal findings of blood chemistry: Secondary | ICD-10-CM

## 2020-08-11 DIAGNOSIS — R748 Abnormal levels of other serum enzymes: Secondary | ICD-10-CM

## 2020-08-11 LAB — URINALYSIS, ROUTINE W REFLEX MICROSCOPIC
Bilirubin Urine: NEGATIVE
Hgb urine dipstick: NEGATIVE
Ketones, ur: NEGATIVE
Leukocytes,Ua: NEGATIVE
Nitrite: NEGATIVE
RBC / HPF: NONE SEEN (ref 0–?)
Specific Gravity, Urine: 1.015 (ref 1.000–1.030)
Total Protein, Urine: 100 — AB
Urine Glucose: NEGATIVE
Urobilinogen, UA: 1 (ref 0.0–1.0)
pH: 6.5 (ref 5.0–8.0)

## 2020-08-11 LAB — CBC
HCT: 41.6 % (ref 39.0–52.0)
Hemoglobin: 13.5 g/dL (ref 13.0–17.0)
MCHC: 32.5 g/dL (ref 30.0–36.0)
MCV: 83.3 fl (ref 78.0–100.0)
Platelets: 158 10*3/uL (ref 150.0–400.0)
RBC: 4.99 Mil/uL (ref 4.22–5.81)
RDW: 16.2 % — ABNORMAL HIGH (ref 11.5–15.5)
WBC: 8.2 10*3/uL (ref 4.0–10.5)

## 2020-08-11 LAB — COMPREHENSIVE METABOLIC PANEL
ALT: 24 U/L (ref 0–53)
AST: 32 U/L (ref 0–37)
Albumin: 3.8 g/dL (ref 3.5–5.2)
Alkaline Phosphatase: 172 U/L — ABNORMAL HIGH (ref 39–117)
BUN: 41 mg/dL — ABNORMAL HIGH (ref 6–23)
CO2: 23 mEq/L (ref 19–32)
Calcium: 9.9 mg/dL (ref 8.4–10.5)
Chloride: 101 mEq/L (ref 96–112)
Creatinine, Ser: 1.67 mg/dL — ABNORMAL HIGH (ref 0.40–1.50)
GFR: 37.21 mL/min — ABNORMAL LOW (ref >60.00)
Glucose, Bld: 100 mg/dL — ABNORMAL HIGH (ref 70–99)
Potassium: 4.3 mEq/L (ref 3.5–5.1)
Sodium: 136 mEq/L (ref 135–145)
Total Bilirubin: 2.1 mg/dL — ABNORMAL HIGH (ref 0.2–1.2)
Total Protein: 6.8 g/dL (ref 6.0–8.3)

## 2020-08-11 LAB — URINE CULTURE
MICRO NUMBER:: 11938580
Result:: NO GROWTH
SPECIMEN QUALITY:: ADEQUATE

## 2020-08-11 LAB — LIPID PANEL
Cholesterol: 145 mg/dL (ref 0–200)
HDL: 33 mg/dL — ABNORMAL LOW (ref >39.00)
LDL Cholesterol: 92 mg/dL (ref 0–99)
NonHDL: 112
Total CHOL/HDL Ratio: 4
Triglycerides: 98 mg/dL (ref 0.0–149.0)
VLDL: 19.6 mg/dL (ref 0.0–40.0)

## 2020-08-11 LAB — GAMMA GT: GGT: 169 U/L — ABNORMAL HIGH (ref 7–51)

## 2020-08-11 LAB — TSH: TSH: 4.67 u[IU]/mL — ABNORMAL HIGH (ref 0.35–4.50)

## 2020-08-11 LAB — HEMOGLOBIN A1C: Hgb A1c MFr Bld: 6.8 % — ABNORMAL HIGH (ref 4.6–6.5)

## 2020-08-11 LAB — VITAMIN D 25 HYDROXY (VIT D DEFICIENCY, FRACTURES): VITD: 76.09 ng/mL (ref 30.00–100.00)

## 2020-08-11 LAB — T4, FREE: Free T4: 1.01 ng/dL (ref 0.60–1.60)

## 2020-08-14 DIAGNOSIS — R600 Localized edema: Secondary | ICD-10-CM | POA: Insufficient documentation

## 2020-08-14 NOTE — Assessment & Plan Note (Signed)
Encouraged heart healthy diet, increase exercise, avoid trans fats, consider a krill oil cap daily 

## 2020-08-14 NOTE — Assessment & Plan Note (Signed)
Well controlled, no changes to meds. Encouraged heart healthy diet such as the DASH diet and exercise as tolerated.  °

## 2020-08-14 NOTE — Assessment & Plan Note (Signed)
Patient encouraged to maintain heart healthy diet, regular exercise, adequate sleep. Consider daily probiotics. Take medications as prescribed. Labs ordered and reviewed. He has aged out of colonoscopy for screening purposes. He continues to decline Covid vaccination, he is encouraged to reconsider.

## 2020-08-14 NOTE — Assessment & Plan Note (Signed)
Supplement and monitor 

## 2020-08-14 NOTE — Assessment & Plan Note (Signed)
hgba1c acceptable, minimize simple carbs. Increase exercise as tolerated. Continue current meds 

## 2020-08-14 NOTE — Assessment & Plan Note (Signed)
Rate controlled and tolerating anticoagulation

## 2020-08-14 NOTE — Assessment & Plan Note (Signed)
Minimize salt, check echo. Elevate feet, try compression socks and increase Lasix x 3 days

## 2020-08-15 ENCOUNTER — Encounter: Payer: Self-pay | Admitting: *Deleted

## 2020-08-15 ENCOUNTER — Other Ambulatory Visit: Payer: Self-pay | Admitting: *Deleted

## 2020-08-15 DIAGNOSIS — R748 Abnormal levels of other serum enzymes: Secondary | ICD-10-CM

## 2020-08-17 ENCOUNTER — Telehealth: Payer: Self-pay | Admitting: Internal Medicine

## 2020-08-17 ENCOUNTER — Ambulatory Visit (HOSPITAL_BASED_OUTPATIENT_CLINIC_OR_DEPARTMENT_OTHER): Payer: PPO | Attending: Family Medicine

## 2020-08-17 NOTE — Telephone Encounter (Signed)
Patient's son called to see if the office could get the patient in asap. Informed son that we do not have anything in the immediate future but would be able to put on the cancellation list. Says that was not good enough and wants to talk with a nurse about patient

## 2020-08-17 NOTE — Telephone Encounter (Signed)
Spoke to patient's son Vincent Allen.He stated father has been feeling bad since the first of year.He has noticed for the past 1 month he is worse.He gets tired and sob just walking to bathroom.He has increased swelling in lower legs and feet.Stated in March in weighed 223 lbs.Stated 1 week ago at PCP he weighed 235 lbs.Stated PCP advised him to take Lasix 20 mg daily instead of as needed.He had a abd ct today and is scheduled to have a echo tomorrow.Appointment offered with PA tomorrow but stated he cannot bring him.Advised he can double lasix dose for the next 3 days then return to normal dose.Appointment scheduled with Coletta Memos NP 6/23 at 2:15 pm.Advised to go to ED if he becomes worse.I will send message to Dr.Hilty to make him aware.

## 2020-08-17 NOTE — Telephone Encounter (Signed)
Thanks ... I agree, sounds like CHF - may be related to pacemaker. Needs to be seen - if he cannot make it to appt, then go to ER.  Dr Lemmie Evens

## 2020-08-18 ENCOUNTER — Ambulatory Visit: Payer: PPO | Admitting: General Practice

## 2020-08-18 ENCOUNTER — Ambulatory Visit (HOSPITAL_BASED_OUTPATIENT_CLINIC_OR_DEPARTMENT_OTHER)
Admission: RE | Admit: 2020-08-18 | Discharge: 2020-08-18 | Disposition: A | Discharge status: Home / Self Care | Payer: PPO | Source: Ambulatory Visit | Attending: Family Medicine | Admitting: Family Medicine

## 2020-08-18 ENCOUNTER — Other Ambulatory Visit: Payer: Self-pay

## 2020-08-18 DIAGNOSIS — R0609 Other forms of dyspnea: Secondary | ICD-10-CM | POA: Diagnosis not present

## 2020-08-18 DIAGNOSIS — R0600 Dyspnea, unspecified: Secondary | ICD-10-CM | POA: Diagnosis not present

## 2020-08-18 LAB — ECHOCARDIOGRAM COMPLETE
Area-P 1/2: 2.42 cm2
MV M vel: 4.83 m/s
MV Peak grad: 93.3 mmHg
S' Lateral: 4.09 cm

## 2020-08-21 ENCOUNTER — Ambulatory Visit: Payer: PPO | Admitting: Podiatry

## 2020-08-21 ENCOUNTER — Other Ambulatory Visit: Payer: Self-pay | Admitting: Family Medicine

## 2020-08-21 ENCOUNTER — Encounter: Payer: Self-pay | Admitting: Podiatry

## 2020-08-21 ENCOUNTER — Other Ambulatory Visit: Payer: Self-pay

## 2020-08-21 DIAGNOSIS — B351 Tinea unguium: Secondary | ICD-10-CM | POA: Diagnosis not present

## 2020-08-21 DIAGNOSIS — N183 Chronic kidney disease, stage 3 unspecified: Secondary | ICD-10-CM | POA: Diagnosis not present

## 2020-08-21 DIAGNOSIS — M79674 Pain in right toe(s): Secondary | ICD-10-CM

## 2020-08-21 DIAGNOSIS — E0822 Diabetes mellitus due to underlying condition with diabetic chronic kidney disease: Secondary | ICD-10-CM

## 2020-08-21 DIAGNOSIS — M79675 Pain in left toe(s): Secondary | ICD-10-CM | POA: Diagnosis not present

## 2020-08-21 NOTE — Progress Notes (Signed)
  Subjective:  Patient ID: Vincent Allen, male    DOB: 03/21/1933,  MRN: [id number]  87 y.o. male presents preventative diabetic foot care and painful thick toenails that are difficult to trim. Pain interferes with ambulation. Aggravating factors include wearing enclosed shoe gear. Pain is relieved with periodic professional debridement.   Patient states his blood glucose was 100 mg/dl this morning. He states he is retaining fluid and that's why his feet are swollen. "I'm on fluid pills for that." He voices no other pedal problems on today's visit.  PCP is Dr. [name] and last visit was 08/10/2020.  Allergies  Allergen Reactions  . Atorvastatin     Myalgias / leg pain and weakness  . Penicillins Hives and Itching    Has patient had a PCN reaction causing immediate rash, facial/tongue/throat swelling, SOB or lightheadedness with hypotension:Yes Has patient had a PCN reaction causing severe rash involving mucus membranes or skin necrosis:No Has patient had a PCN reaction that required hospitalization:No Has patient had a PCN reaction occurring within the last 10 years:No If all of the above answers are "NO", then may proceed with Cephalosporin use.   . Verapamil     Junctional rhythm  . Influenza Vaccines Rash    tinnitus    Review of Systems: Negative except as noted in the HPI.   Objective:   Constitutional Pt is a pleasant 87 y.o. Caucasian male morbidly obese in NAD. AAO x 3.   Vascular Capillary fill time to digits <3 seconds b/l lower extremities. Faintly palpable pedal pulses b/l. Pedal hair absent. Lower extremity skin temperature gradient within normal limits. No pain with calf compression b/l. +1 pitting edema b/l feet.  Neurologic Protective sensation intact 5/5 intact bilaterally with 10g monofilament b/l.  Dermatologic Pedal skin with normal turgor, texture and tone bilaterally. No open wounds bilaterally. No interdigital macerations bilaterally. Toenails 1-5 b/l  elongated, discolored, dystrophic, thickened, crumbly with subungual debris and tenderness to dorsal palpation.  Orthopedic: Normal muscle strength 5/5 to all lower extremity muscle groups bilaterally. No pain crepitus or joint limitation noted with ROM b/l. No gross bony deformities bilaterally. Wearing appropriate fitting shoe gear. Utilizes cane for ambulation assistance.   Radiographs: None Assessment:   1. Pain due to onychomycosis of toenails of both feet   2. Diabetes mellitus due to underlying condition with stage 3 chronic kidney disease, unspecified whether long term insulin use, unspecified whether stage 3a or 3b CKD ([place])    Plan:  Patient was evaluated and treated and all questions answered.  Onychomycosis with pain -Nails palliatively debridement as below. -Educated on self-care  Procedure: Nail Debridement Rationale: Pain Type of Debridement: manual, sharp debridement. Instrumentation: Nail nipper, rotary burr. Number of Nails: 10  -Examined patient. -Continue diabetic foot care principles. -Patient to continue soft, supportive shoe gear daily. -Toenails 1-5 b/l were debrided in length and girth with sterile nail nippers and dremel without iatrogenic bleeding.  -Patient to report any pedal injuries to medical professional immediately. -Patient/POA to call should there be question/concern in the interim.  Return in about 3 months (around 11/21/2020).  Marzetta Board, DPM

## 2020-08-29 ENCOUNTER — Other Ambulatory Visit: Payer: Self-pay | Admitting: Family Medicine

## 2020-08-29 ENCOUNTER — Telehealth: Payer: Self-pay

## 2020-08-29 MED ORDER — TRIAMCINOLONE ACETONIDE 0.1 % EX CREA
1.0000 | TOPICAL_CREAM | Freq: Two times a day (BID) | CUTANEOUS | 0 refills | Status: DC
Start: 2020-08-29 — End: 2021-02-26

## 2020-08-29 NOTE — Telephone Encounter (Signed)
Pt's Son, Shanon Brow, called to let PCP know pt has developed a rash on both shins, no itching associated with the rash.  He stated the swelling has gone down in pt's legs since taking the furosemide.  He was wondering if there is a topical ointment or cream that can be called in for the rash in pt's legs.  Shanon Brow can be reached at [phone].

## 2020-08-29 NOTE — Telephone Encounter (Signed)
Pt's son is aware of instructions

## 2020-09-01 ENCOUNTER — Other Ambulatory Visit: Payer: Self-pay

## 2020-09-01 ENCOUNTER — Ambulatory Visit (HOSPITAL_BASED_OUTPATIENT_CLINIC_OR_DEPARTMENT_OTHER)
Admission: RE | Admit: 2020-09-01 | Discharge: 2020-09-01 | Disposition: A | Discharge status: Home / Self Care | Payer: PPO | Source: Ambulatory Visit | Attending: Family Medicine | Admitting: Family Medicine

## 2020-09-01 DIAGNOSIS — R748 Abnormal levels of other serum enzymes: Secondary | ICD-10-CM | POA: Diagnosis not present

## 2020-09-01 DIAGNOSIS — K760 Fatty (change of) liver, not elsewhere classified: Secondary | ICD-10-CM | POA: Diagnosis not present

## 2020-09-05 NOTE — Progress Notes (Signed)
Cardiology Clinic Note   Patient Name: Vincent Allen Date of Encounter: 09/07/2020  Primary Care Provider:  [name], MD Primary Cardiologist:  Pixie Casino, MD  Patient Profile    Vincent Grinder. Allen 87 year old male presents to the clinic today for evaluation of his weakness and lower extremity swelling.  Past Medical History    Past Medical History:  Diagnosis Date   Arthritis    Bradycardia    Cataract    Chronic kidney disease stage III (GFR 30-59 ml/min) 05/03/2012   Colon polyps    Complication of anesthesia    emesis   Diabetes mellitus    GERD (gastroesophageal reflux disease)    HOH (hard of hearing)    HTN (hypertension) 01/13/2013   Hyperlipidemia    Hypertension    Pacemaker 12/2016   Persistent atrial fibrillation ([place])    Presence of permanent cardiac pacemaker 09/20/2016   Symptomatic bradycardia    Vitamin D deficiency 07/17/2015   Past Surgical History:  Procedure Laterality Date   ANKLE ARTHROPLASTY     CARDIOVERSION N/A 08/20/2016   Procedure: CARDIOVERSION;  Surgeon: Sanda Klein, MD;  Location: [facility];  Service: Cardiovascular;  Laterality: N/A;   CATARACT EXTRACTION  2010, 2014   CATARACT EXTRACTION  02/28/14   CHOLECYSTECTOMY     CHOLECYSTECTOMY N/A 05/03/2012   Procedure: LAPAROSCOPIC CHOLECYSTECTOMY WITH INTRAOPERATIVE CHOLANGIOGRAM;  Surgeon: [name], MD;  Location: [facility];  Service: General;  Laterality: N/A;   INSERT / REPLACE / REMOVE PACEMAKER  09/20/2016   PACEMAKER IMPLANT N/A 09/20/2016   Procedure: Pacemaker Implant;  Surgeon: Deboraha Sprang, MD;  Location: [facility];  Service: Cardiovascular;  Laterality: N/A;   ROTATOR CUFF REPAIR     SHOULDER SURGERY     TONSILLECTOMY      Allergies  Allergies  Allergen Reactions   Atorvastatin     Myalgias / leg pain and weakness   Penicillins Hives and Itching    Has patient had a PCN reaction causing immediate rash, facial/tongue/throat swelling, SOB  or lightheadedness with hypotension:Yes Has patient had a PCN reaction causing severe rash involving mucus membranes or skin necrosis:No Has patient had a PCN reaction that required hospitalization:No Has patient had a PCN reaction occurring within the last 10 years:No If all of the above answers are "NO", then may proceed with Cephalosporin use.    Verapamil     Junctional rhythm   Influenza Vaccines Rash    tinnitus    History of Present Illness    Vincent Allen has a PMH of hypertensive heart disease, CAD, essential hypertension, persistent atrial fibrillation, chronic diastolic CHF, GERD, chronic CKD stage III, hyperlipidemia, status post PPM, and morbid obesity.   He is previously seen by Dr. Wynonia Lawman for his paroxysmal atrial fibrillation. chads vas score of 4.  He was started on Eliquis 2.5 mg twice daily due to his creatinine and age.  An echocardiogram at that time showed normal LV function, diastolic dysfunction, moderate left atrial enlargement.  Outpatient monitoring was arranged to evaluate his persistent atrial fibrillation/flutter with controlled ventricular response to assess for presence of pauses.  He reported being fatigued at that time.  He reported that he had been somewhat fatigued for over 60 years.  He was interested in getting back to flying and felt that his lack of energy was keeping him from this.   He was last seen by Dr. Debara Pickett on 05/11/2018.  During that time he was doing  well.  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  He was worried that he may follow-up.  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  He denied chest pain, shortness of breath, syncope.  His pacemaker interrogation at that time showed normal pacing.  He was ventricular paced at that time.  He was noted to have persistent/permanent atrial fibrillation.  He denied bleeding issues with Eliquis.  His blood pressure was well controlled.  His LDL  at that time was 100.  His goal is slightly lower than this however aggressive intervention was not chosen.  He was not sure if he was taking atorvastatin 20 and it was recommended that if he is not taking it he consider resuming the medicine.   He was seen by his PCP on 09/14/2019.  During that time he described fast heart rates, indicating that he had had 3 episodes.  He denied shortness of breath, chest pain, lower extremity edema, and diaphoresis .  His blood pressure was well controlled at 110/72.   He presented to the clinic 09/21/2019 for follow-up evaluation and stated he had 3 episodes of accelerated heart rate.  He noted that the fast heart rate lasted for less than a minute and then went back to normal.  He stated the last incident where he noticed a rapid heart rate was around 1 month prior.  His increased heart rate woke him from his sleep.  He stated that by the time he sat up and walked to the bathroom his heart rate had returned to normal.  His EKG  showed occasional premature ventricular complexes and fusion complexes.  He stated that he had also recently been put on metoprolol succinate and taken off losartan.  Given his infrequent nature of palpitations we continued to monitor.  Lab work from 09/14/2019 unremarkable.  He stated he did not add extra salt to his food.  However he did not search for low-sodium options.  I gave him the salty 6 diet sheet, asked him increase his physical activity  tolerated, and ordered a monitor in the future if episodes recur.  His echocardiogram 09/14/2020 showed an LVEF of 40-45%, moderate mitral valve regurgitation, and mild aortic valve sclerosis with no stenosis.   He was seen by Fabian Sharp PA-C on 03/24/2020.  During that time he had no complaints other than feeling unsteady on his feet.  He was walking with a cane and using a shopping cart at the grocery store.  He denied chest pain, shortness of breath, recent falls, and syncope.  He denied bleeding problems  on Eliquis.  His weight was 230 pounds which was up from 222 pounds.  CHA2DS2-VASc score 6  His son contacted the nurse triage line on 08/17/2020 with complaints of lower extremity edema and weakness.  He presents to the clinic today with his son for evaluation and states over the last several weeks he has noticed increased work of breathing with normal activities and increased swelling in his lower legs.  He describes some weakness as well.  He reports that he continues to urinate well with his furosemide.  His weight is up around 10-12 pounds.  We reviewed the importance of heart healthy low-sodium diet, fluid restriction, and elevating his lower extremities.  He reports that he is not able to wear lower extremity support stockings.  I will increase his furosemide x4 days, give supplemental potassium, give him a salty 6  diet sheet, and have him follow-up in 2 weeks.   Today he denies chest pain, shortness of breath, lower extremity edema, fatigue, palpitations, melena, hematuria, hemoptysis, diaphoresis, weakness, presyncope, syncope, orthopnea, and PND.  Home Medications    Prior to Admission medications   Medication Sig Start Date End Date Taking? Authorizing Provider  amLODipine (NORVASC) 5 MG tablet TAKE 1 TABLET BY MOUTH EVERY DAY 06/27/20   [name], MD  Calcium 600 MG tablet Take 1 tablet (600 mg total) by mouth 2 (two) times daily. 01/30/11   Burnice Logan, MD  ELIQUIS 2.5 MG TABS tablet TAKE 1 TABLET BY MOUTH TWICE A DAY 01/01/20   [name], MD  furosemide (LASIX) 20 MG tablet TAKE 1 TABLET BY MOUTH DAILY AS NEEDED FOR EDEMA (WEIGHT GAIN >3LBS IN 24 HRS, SOB). 08/10/20   [name], MD  glipiZIDE (GLUCOTROL) 10 MG tablet TAKE 2 TABLETS (20 MG TOTAL) BY MOUTH 2 (TWO) TIMES DAILY BEFORE A MEAL. 05/29/20   [name], MD  glucose blood (ONE TOUCH ULTRA TEST) test strip USE 1 STRIP 2 TIMES DAILY TO CHECK BLOOD SUGAR DX E08.22, N18.3 11/03/18   [name], MD  Krill  Oil 1000 MG CAPS Take 1 capsule by mouth daily.    [provider]  metoprolol succinate (TOPROL-XL) 25 MG 24 hr tablet TAKE 1 TABLET BY MOUTH EVERY DAY 08/22/20   [name], MD  sitaGLIPtin (JANUVIA) 50 MG tablet Take 1 tablet (50 mg total) by mouth daily. 07/18/20   [name], MD  triamcinolone cream (KENALOG) 0.1 % Apply 1 application topically 2 (two) times daily. 08/29/20   [name], MD  triamterene-hydrochlorothiazide (MAXZIDE) 75-50 MG tablet TAKE 1 TABLET BY MOUTH EVERY DAY 05/22/20   [name], MD  Vitamin D, Cholecalciferol, 25 MCG (1000 UT) CAPS Take 1 capsule by mouth daily.    [provider]  Zinc 50 MG TABS Take 1 tablet by mouth daily.    [provider]    Family History    Family History  Problem Relation Age of Onset   Cancer Mother        colon, breast, pancreas, skin cancer   Heart disease Father        heart valve replaced   Stroke Father    Diabetes Father    Cancer Paternal Grandmother        colon   Obesity Son    Heart disease Son        bradycardia   He indicated that his mother is deceased. He indicated that his father is deceased. He indicated that his maternal grandmother is deceased. He indicated that his maternal grandfather is deceased. He indicated that his paternal grandmother is deceased. He indicated that his paternal grandfather is deceased. He indicated that his daughter is alive. He indicated that only one of his two sons is alive.  Social History    Social History   Socioeconomic History   Marital status: Widowed    Spouse name: Not on file   Number of children: Not on file   Years of education: Not on file   Highest education level: Not on file  Occupational History   Not on file  Tobacco Use   Smoking status: Never   Smokeless tobacco: Never  Vaping Use   Vaping Use: Never used  Substance and Sexual Activity   Alcohol use: Yes    Alcohol/week: 1.0 standard drink  Types: 1 Glasses  of wine per week   Drug use: No   Sexual activity: Not on file    Comment: lives alone , no major dietary restrictions, 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.  Other Topics Concern   Not on file  Social History Narrative   Not on file   Social Determinants of Health   Financial Resource Strain: Medium Risk   Difficulty of Paying Living Expenses: Somewhat hard  Food Insecurity: No Food Insecurity   Worried About Running Out of Food in the Last Year: Never true   Ran Out of Food in the Last Year: Never true  Transportation Needs: No Transportation Needs   Lack of Transportation (Medical): No   Lack of Transportation (Non-Medical): No  Physical Activity: Inactive   Days of Exercise per Week: 0 days   Minutes of Exercise per Session: 0 min  Stress: Not on file  Social Connections: Not on file  Intimate Partner Violence: Not on file     Review of Systems    General:  No chills, fever, night sweats or weight changes.  Cardiovascular:  No chest pain, dyspnea on exertion, edema, orthopnea, palpitations, paroxysmal nocturnal dyspnea. Dermatological: No rash, lesions/masses Respiratory: No cough, dyspnea Urologic: No hematuria, dysuria Abdominal:   No nausea, vomiting, diarrhea, bright red blood per rectum, melena, or hematemesis Neurologic:  No visual changes, wkns, changes in mental status. All other systems reviewed and are otherwise negative except as noted above.  Physical Exam    VS:  BP 133/80   Pulse 65   Ht 6' (1.829 m)   Wt 236 lb (107 kg)   SpO2 97%   BMI 32.01 kg/m  , BMI Body mass index is 32.01 kg/m. GEN: Well nourished, well developed, in no acute distress. HEENT: normal. Neck: Supple, no JVD, carotid bruits, or masses. Cardiac: RRR, no murmurs, rubs, or gallops. No clubbing, cyanosis, edema.  Radials/DP/PT 2+ and equal bilaterally.  Respiratory:  Respirations regular and unlabored, clear to auscultation bilaterally. GI: Soft, nontender,  nondistended, BS + x 4. MS: no deformity or atrophy. Skin: warm and dry, no rash. Neuro:  Strength and sensation are intact. Psych: Normal affect.  Accessory Clinical Findings    Recent Labs: 08/10/2020: ALT 24; BUN 41; Creatinine, Ser 1.67; Hemoglobin 13.5; Platelets 158.0; Potassium 4.3; Sodium 136; TSH 4.67   Recent Lipid Panel    Component Value Date/Time   CHOL 145 08/10/2020 1513   TRIG 98.0 08/10/2020 1513   HDL 33.00 (L) 08/10/2020 1513   CHOLHDL 4 08/10/2020 1513   VLDL 19.6 08/10/2020 1513   LDLCALC 92 08/10/2020 1513   LDLCALC 101 (H) 12/16/2019 1543   LDLDIRECT 65.0 11/03/2018 1038    ECG personally reviewed by me today- none today.  demand pacemaker, sinus rhythm with A-V dissociation and wide complex QRS axis with occasional PVCs and fusion complexes left axis deviation nonspecific intraventricular block lateral infarct undetermined age, inferior infarct undetermined age 6 bpm   EKG 05/11/2018 V-paced 75 bpm   Echocardiogram 07/10/2016 Study Conclusions   - Left ventricle: The cavity size was normal. Wall thickness was    normal. The estimated ejection fraction was 55%. Indeterminant    diastolic function (atrial fibrillation). Although no diagnostic    regional wall motion abnormality was identified, this possibility    cannot be completely excluded on the basis of this study.  - Aortic valve: There was no stenosis.  - Mitral valve: Mildly calcified annulus. Mildly  calcified leaflets    . There was mild regurgitation.  - Left atrium: The atrium was moderately dilated.  - Right ventricle: The cavity size was mildly dilated. Systolic    function was normal.  - Tricuspid valve: Peak RV-RA gradient (S): 46 mm Hg.  - Pulmonary arteries: PA peak pressure: 54 mm Hg (S).  - Systemic veins: IVC measured 2.1 cm with > 50% respirophasic    variation, suggesting RA pressure 8 mmHg.     Echocardiogram 09/14/2020 IMPRESSIONS     1. Left ventricular ejection  fraction, by estimation, is 40 to 45%. The  left ventricle has mildly decreased function. The left ventricle has no  regional wall motion abnormalities. Left ventricular diastolic function  could not be evaluated.   2. The mitral valve is normal in structure. Moderate mitral valve  regurgitation. No evidence of mitral stenosis.   3. The aortic valve is normal in structure. Aortic valve regurgitation is  not visualized. Mild aortic valve sclerosis is present, with no evidence  of aortic valve stenosis.   4. The inferior vena cava is normal in size with greater than 50%  respiratory variability, suggesting right atrial pressure of 3 mmHg. Assessment & Plan   1.  Systolic and diastolic CHF-+1 bilateral pitting lower extremity edema.  No increased DOE, orthopnea or PND. Continue metoprolol, Maxide,  Increase furosemide to 40 mg daily x3 days then return to 20 mg daily Start potassium 10 mill equivalents x3 days then stop Heart healthy low-sodium diet-salty 6 given Increase physical activity as tolerated Order BMP in 1 week. Daily weights contact office with a weight gain of 3 pounds overnight or 5 pounds in 1 week Elevate lower extremities when active Fluid restriction less than 64 ounces daily  Essential hypertension-BP today 133/80.  Well-controlled at home Continue amlodipine, metoprolol, Maxide Heart healthy low-sodium diet-salty 6 given Increase physical activity as tolerated   Hyperlipidemia- 08/10/2020: Cholesterol 145; HDL 33.00; LDL Cholesterol 92; Triglycerides 98.0; VLDL 19.6 Continue atorvastatin Heart healthy low-sodium high-fiber diet Increase physical activity as tolerated Follows with PCP  Persistent atrial fibrillation/LBBB-heart rate today 65 bpm.  Status post PPM 7/18.  CHA2DS2-VASc score 4.  Noticed 3 episodes of accelerated heart rate.  Last episode of increased heart rate was more than 1 month ago.  Labs from 09/14/2019 unremarkable. Continue Eliquis,  metoprolol Continue to monitor. Contact EP for remote device interrogation and follow-up.  Disposition: Follow-up with me in 2 weeks.  Jossie Ng. Tesia Lybrand NP-C    09/07/2020, 2:24 PM [facility] [address] Office [phone] Fax [phone]  Notice: This dictation was prepared with Dragon dictation along with smaller phrase technology. Any transcriptional errors that result from this process are unintentional and may not be corrected upon review.  I spent 15 minutes examining this patient, reviewing medications, and using patient centered shared decision making involving her cardiac care.  Prior to her visit I spent greater than 20 minutes reviewing her past medical history,  medications, and prior cardiac tests.

## 2020-09-06 ENCOUNTER — Other Ambulatory Visit: Payer: Self-pay | Admitting: Family Medicine

## 2020-09-07 ENCOUNTER — Ambulatory Visit: Payer: PPO | Admitting: General Practice

## 2020-09-07 ENCOUNTER — Encounter: Payer: Self-pay | Admitting: General Practice

## 2020-09-07 ENCOUNTER — Other Ambulatory Visit: Payer: Self-pay

## 2020-09-07 VITALS — BP 133/80 | HR 65 | Ht 72.0 in | Wt 236.0 lb

## 2020-09-07 DIAGNOSIS — Z79899 Other long term (current) drug therapy: Secondary | ICD-10-CM | POA: Diagnosis not present

## 2020-09-07 DIAGNOSIS — I10 Essential (primary) hypertension: Secondary | ICD-10-CM | POA: Diagnosis not present

## 2020-09-07 DIAGNOSIS — I5043 Acute on chronic combined systolic (congestive) and diastolic (congestive) heart failure: Secondary | ICD-10-CM

## 2020-09-07 DIAGNOSIS — I4819 Other persistent atrial fibrillation: Secondary | ICD-10-CM

## 2020-09-07 DIAGNOSIS — E785 Hyperlipidemia, unspecified: Secondary | ICD-10-CM

## 2020-09-07 DIAGNOSIS — I447 Left bundle-branch block, unspecified: Secondary | ICD-10-CM | POA: Diagnosis not present

## 2020-09-07 MED ORDER — FUROSEMIDE 20 MG PO TABS: 20.0000 mg | ORAL_TABLET | Freq: Every day | ORAL | 1 refills | Status: DC

## 2020-09-07 MED ORDER — POTASSIUM CHLORIDE CRYS ER 20 MEQ PO TBCR: 20.0000 meq | EXTENDED_RELEASE_TABLET | Freq: Every day | ORAL | 0 refills | Status: DC

## 2020-09-07 NOTE — Patient Instructions (Signed)
Medication Instructions:  INCREASE FUROSEMIDE 40MG  DAILY x4 DAYS THEN BACK TO 20MG  DAILY  TAKE POTASSIUM 20MEQ x4DAYS THEN STOP *If you need a refill on your cardiac medications before your next appointment, please call your pharmacy*  Lab Work:    BMET IN 1 WEEK  Special Instructions CALL IF WEIGHT/SWELLING IS NOT GONE  PLEASE READ AND FOLLOW SALTY 6-ATTACHED-1,800mg  daily  Follow-Up: Your next appointment:  10-20-2020 In Person with Morning Glory, FNP-C   At [facility], you and your health needs are our priority.  As part of our continuing mission to provide you with exceptional heart care, we have created designated Provider Care Teams.  These Care Teams include your primary Cardiologist (physician) and Advanced Practice Providers (APPs -  Physician Assistants and Nurse Practitioners) who all work together to provide you with the care you need, when you need it.            6 SALTY THINGS TO AVOID     1,800MG  DAILY

## 2020-09-12 ENCOUNTER — Encounter: Payer: Self-pay | Admitting: *Deleted

## 2020-09-12 ENCOUNTER — Telehealth: Payer: Self-pay | Admitting: Internal Medicine

## 2020-09-12 NOTE — Telephone Encounter (Signed)
Spoke with the patient's son per the DPR. He stated that the patient was down 6 pounds since the last office visit. The patient still has mild bilateral edema and shortness of breath but it has improved.   The patient took Furosemide 40 mg for 4 days and has been taking Potassium 20 mEq once daily for the past four days. The son would like to know if the patient should continue the Furosemide 40 mg and Potassium a few more days and if the patient should be on 20 mEq or 10 mEq of the potassium.   The patient's son has been reminded for the patient to have lab work completed this week.    Per office note: Increase furosemide to 40 mg daily x3 days then return to 20 mg daily Start potassium 10 mill equivalents x3 days then stop Heart healthy low-sodium diet-salty 6 given Increase physical activity as tolerated Order BMP in 1 week.

## 2020-09-12 NOTE — Telephone Encounter (Signed)
  Patient's son is calling to let Coletta Memos know that Mr Crotty has lost about 6 pounds since last Thursday. He was 236 at the appt on 6/23 with Denyse Amass and this morning he weighs 227. Denyse Amass had told them to let him know if he lost any weight after being put on the lasix and potassium. They would like to know if he should continue to take these. Can send response through MyChart

## 2020-09-12 NOTE — Telephone Encounter (Signed)
Message sent to the patient's MyChart per the son's request with Jesse's reply:  Thank you for the update.  Please have Vincent Allen continue 20 mg of furosemide daily.  He may stop his potassium.  We will wait for him to have his BMP drawn and reviewed the results.  He may use lower extremity support stockings, continue to avoid sodium in his diet, and elevate his lower extremities when he is not active.  Thank you. Andrews

## 2020-09-15 ENCOUNTER — Telehealth: Payer: Self-pay | Admitting: Family Medicine

## 2020-09-15 ENCOUNTER — Other Ambulatory Visit: Payer: Self-pay

## 2020-09-15 ENCOUNTER — Other Ambulatory Visit (INDEPENDENT_AMBULATORY_CARE_PROVIDER_SITE_OTHER): Payer: PPO

## 2020-09-15 DIAGNOSIS — R748 Abnormal levels of other serum enzymes: Secondary | ICD-10-CM

## 2020-09-15 LAB — COMPREHENSIVE METABOLIC PANEL
ALT: 17 U/L (ref 0–53)
AST: 27 U/L (ref 0–37)
Albumin: 3.7 g/dL (ref 3.5–5.2)
Alkaline Phosphatase: 129 U/L — ABNORMAL HIGH (ref 39–117)
BUN: 34 mg/dL — ABNORMAL HIGH (ref 6–23)
CO2: 24 mEq/L (ref 19–32)
Calcium: 9.6 mg/dL (ref 8.4–10.5)
Chloride: 102 mEq/L (ref 96–112)
Creatinine, Ser: 1.69 mg/dL — ABNORMAL HIGH (ref 0.40–1.50)
GFR: 36.66 mL/min — ABNORMAL LOW (ref >60.00)
Glucose, Bld: 103 mg/dL — ABNORMAL HIGH (ref 70–99)
Potassium: 3.8 mEq/L (ref 3.5–5.1)
Sodium: 139 mEq/L (ref 135–145)
Total Bilirubin: 1.7 mg/dL — ABNORMAL HIGH (ref 0.2–1.2)
Total Protein: 6.7 g/dL (ref 6.0–8.3)

## 2020-09-15 LAB — GAMMA GT: GGT: 124 U/L — ABNORMAL HIGH (ref 7–51)

## 2020-09-15 NOTE — Telephone Encounter (Signed)
Pt's son dropped off document to be filled out by provider ([employer] document 2 pages and also an envelope with documents- all documents in large yellow envelope) Pt would like document to be mailed out to address on document and call pt when it is mailed out. Document put at front office tray under providers name.

## 2020-09-19 NOTE — Telephone Encounter (Signed)
Left message on machine to see if patient wanted to pickup forms or we mail them to him?

## 2020-09-20 NOTE — Telephone Encounter (Signed)
Patient will pickup paperwork.  Paperwork placed up front.

## 2020-09-27 ENCOUNTER — Ambulatory Visit (INDEPENDENT_AMBULATORY_CARE_PROVIDER_SITE_OTHER): Payer: PPO

## 2020-09-27 DIAGNOSIS — I495 Sick sinus syndrome: Secondary | ICD-10-CM

## 2020-09-27 LAB — CUP PACEART REMOTE DEVICE CHECK
Battery Remaining Longevity: 92 mo
Battery Voltage: 2.98 V
Brady Statistic RA Percent Paced: 2.54 %
Brady Statistic RV Percent Paced: 99.67 %
Date Time Interrogation Session: 20220713003742
Implantable Lead Implant Date: 20180706
Implantable Lead Implant Date: 20180706
Implantable Lead Location: 753859
Implantable Lead Location: 753860
Implantable Lead Model: 5076
Implantable Lead Model: 5076
Implantable Pulse Generator Implant Date: 20180706
Lead Channel Impedance Value: 304 Ohm
Lead Channel Impedance Value: 399 Ohm
Lead Channel Impedance Value: 456 Ohm
Lead Channel Impedance Value: 513 Ohm
Lead Channel Pacing Threshold Amplitude: 0.75 V
Lead Channel Pacing Threshold Pulse Width: 0.4 ms
Lead Channel Sensing Intrinsic Amplitude: 0.5 mV
Lead Channel Sensing Intrinsic Amplitude: 0.5 mV
Lead Channel Sensing Intrinsic Amplitude: 10.875 mV
Lead Channel Sensing Intrinsic Amplitude: 10.875 mV
Lead Channel Setting Pacing Amplitude: 2 V
Lead Channel Setting Pacing Amplitude: 2.5 V
Lead Channel Setting Pacing Pulse Width: 0.4 ms
Lead Channel Setting Sensing Sensitivity: 2.8 mV

## 2020-09-29 ENCOUNTER — Other Ambulatory Visit: Payer: Self-pay | Admitting: Family Medicine

## 2020-10-08 ENCOUNTER — Other Ambulatory Visit: Payer: Self-pay | Admitting: Family Medicine

## 2020-10-08 DIAGNOSIS — I4819 Other persistent atrial fibrillation: Secondary | ICD-10-CM

## 2020-10-11 DIAGNOSIS — Z85828 Personal history of other malignant neoplasm of skin: Secondary | ICD-10-CM | POA: Diagnosis not present

## 2020-10-11 DIAGNOSIS — C4442 Squamous cell carcinoma of skin of scalp and neck: Secondary | ICD-10-CM | POA: Diagnosis not present

## 2020-10-11 DIAGNOSIS — L821 Other seborrheic keratosis: Secondary | ICD-10-CM | POA: Diagnosis not present

## 2020-10-11 DIAGNOSIS — D044 Carcinoma in situ of skin of scalp and neck: Secondary | ICD-10-CM | POA: Diagnosis not present

## 2020-10-11 DIAGNOSIS — D485 Neoplasm of uncertain behavior of skin: Secondary | ICD-10-CM | POA: Diagnosis not present

## 2020-10-11 DIAGNOSIS — D692 Other nonthrombocytopenic purpura: Secondary | ICD-10-CM | POA: Diagnosis not present

## 2020-10-18 NOTE — Progress Notes (Signed)
Cardiology Clinic Note   Patient Name: Vincent Allen Date of Encounter: 10/20/2020  Primary Care Provider:  [name], MD Primary Cardiologist:  Vincent Casino, MD  Patient Profile    Vincent Allen 87 year old male presents to the clinic today for follow-up evaluation of his weakness and lower extremity swelling.  Past Medical History    Past Medical History:  Diagnosis Date   Arthritis    Bradycardia    Cataract    Chronic kidney disease stage III (GFR 30-59 ml/min) 05/03/2012   Colon polyps    Complication of anesthesia    emesis   Diabetes mellitus    GERD (gastroesophageal reflux disease)    HOH (hard of hearing)    HTN (hypertension) 01/13/2013   Hyperlipidemia    Hypertension    Pacemaker 12/2016   Persistent atrial fibrillation ([place])    Presence of permanent cardiac pacemaker 09/20/2016   Symptomatic bradycardia    Vitamin D deficiency 07/17/2015   Past Surgical History:  Procedure Laterality Date   ANKLE ARTHROPLASTY     CARDIOVERSION N/A 08/20/2016   Procedure: CARDIOVERSION;  Surgeon: Vincent Klein, MD;  Location: [facility];  Service: Cardiovascular;  Laterality: N/A;   CATARACT EXTRACTION  2010, 2014   CATARACT EXTRACTION  02/28/14   CHOLECYSTECTOMY     CHOLECYSTECTOMY N/A 05/03/2012   Procedure: LAPAROSCOPIC CHOLECYSTECTOMY WITH INTRAOPERATIVE CHOLANGIOGRAM;  Surgeon: Vincent Ober, MD;  Location: [facility];  Service: General;  Laterality: N/A;   INSERT / REPLACE / REMOVE PACEMAKER  09/20/2016   PACEMAKER IMPLANT N/A 09/20/2016   Procedure: Pacemaker Implant;  Surgeon: Vincent Sprang, MD;  Location: [facility];  Service: Cardiovascular;  Laterality: N/A;   ROTATOR CUFF REPAIR     SHOULDER SURGERY     TONSILLECTOMY      Allergies  Allergies  Allergen Reactions   Atorvastatin     Myalgias / leg pain and weakness   Penicillins Hives and Itching    Has patient had a PCN reaction causing immediate rash, facial/tongue/throat  swelling, SOB or lightheadedness with hypotension:Yes Has patient had a PCN reaction causing severe rash involving mucus membranes or skin necrosis:No Has patient had a PCN reaction that required hospitalization:No Has patient had a PCN reaction occurring within the last 10 years:No If all of the above answers are "NO", then may proceed with Cephalosporin use.    Verapamil     Junctional rhythm   Influenza Vaccines Rash    tinnitus    History of Present Illness    [name] has a PMH of hypertensive heart disease, CAD, essential hypertension, persistent atrial fibrillation, chronic diastolic CHF, GERD, chronic CKD stage III, hyperlipidemia, status post PPM, and morbid obesity.   He is previously seen by Vincent Allen for his paroxysmal atrial fibrillation. chads vas score of 4.  He was started on Eliquis 2.5 mg twice daily due to his creatinine and age.  An echocardiogram at that time showed normal LV function, diastolic dysfunction, moderate left atrial enlargement.  Outpatient monitoring was arranged to evaluate his persistent atrial fibrillation/flutter with controlled ventricular response to assess for presence of pauses.  He reported being fatigued at that time.  He reported that he had been somewhat fatigued for over 60 years.  He was interested in getting back to flying and felt that his lack of energy was keeping him from this.   He was last seen by Vincent Allen on 05/11/2018.  During that time he was  doing well.  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  He was worried that he may follow-up.  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  He denied chest pain, shortness of breath, syncope.  His pacemaker interrogation at that time showed normal pacing.  He was ventricular paced at that time.  He was noted to have persistent/permanent atrial fibrillation.  He denied bleeding issues with Eliquis.  His blood pressure was well  controlled.  His LDL at that time was 100.  His goal is slightly lower than this however aggressive intervention was not chosen.  He was not sure if he was taking atorvastatin 20 and it was recommended that if he is not taking it he consider resuming the medicine.   He was seen by his PCP on 09/14/2019.  During that time he described fast heart rates, indicating that he had had 3 episodes.  He denied shortness of breath, chest pain, lower extremity edema, and diaphoresis .  His blood pressure was well controlled at 110/72.   He presented to the clinic 09/21/2019 for follow-up evaluation and stated he had 3 episodes of accelerated heart rate.  He noted that the fast heart rate lasted for less than a minute and then went back to normal.  He stated the last incident where he noticed a rapid heart rate was around 1 month prior.  His increased heart rate woke him from his sleep.  He stated that by the time he sat up and walked to the bathroom his heart rate had returned to normal.  His EKG  showed occasional premature ventricular complexes and fusion complexes.  He stated that he had also recently been put on metoprolol succinate and taken off losartan.  Given his infrequent nature of palpitations we continued to monitor.  Lab work from 09/14/2019 unremarkable.  He stated he did not add extra salt to his food.  However he did not search for low-sodium options.  I gave him the salty 6 diet sheet, asked him increase his physical activity  tolerated, and ordered a monitor in the future if episodes recur.  His echocardiogram 09/14/2020 showed an LVEF of 40-45%, moderate mitral valve regurgitation, and mild aortic valve sclerosis with no stenosis.   He was seen by Vincent Sharp PA-C on 03/24/2020.  During that time he had no complaints other than feeling unsteady on his feet.  He was walking with a cane and using a shopping cart at the grocery store.  He denied chest pain, shortness of breath, recent falls, and syncope.  He  denied bleeding problems on Eliquis.  His weight was 230 pounds which was up from 222 pounds.  CHA2DS2-VASc score 6   His son contacted the nurse triage line on 08/17/2020 with complaints of lower extremity edema and weakness.   He presented to the clinic 09/08/2018 with his son for evaluation and stated over the last several weeks he had noticed increased work of breathing with normal activities and increased swelling in his lower legs.  He described some weakness as well.  He reported that he continued to urinate well with his furosemide.  His weight was up around 10-12 pounds.  We reviewed the importance of heart healthy low-sodium diet, fluid restriction, and elevating his lower extremities.  He reported that he was not able to wear lower extremity support stockings.  I increased his furosemide x4 days, gave supplemental potassium, gave him a  salty 6 diet sheet, and planned follow-up in 2 weeks.   He presents to the clinic today for follow-up evaluation states he feels fatigued.  He reports that he has been fairly sedentary over the last year and a half.  He has been monitoring his weight closely and weighs 220 pounds today during his previous visit he was 236 pounds.  He is now taking his furosemide every 2 to 3 days.  He reports that when he takes his furosemide he is unable to sleep due to frequent episodes of urination.  His son reports that he intermittent dry cough.  They deny fevers chills and production with the cough.  I explained that it sounds like it might be related to allergies.  He reports that he has had intermittent periods of allergies for several years in the spring and summer.  He does take his furosemide in a.m.  I reviewed the importance of heart healthy low-sodium diet and increasing his physical activity.  I will give him chair exercises to start increasing his physical activity with.  His son asks about B12 deficiency.  I have asked him to follow-up with his PCP to have his B12  checked.  At that time I would like him to have his fasting lipids and LFTs drawn as well.  We will have him follow-up in 6 months.   Today he denies chest pain, shortness of breath, lower extremity edema, fatigue, palpitations, melena, hematuria, hemoptysis, diaphoresis, weakness, presyncope, syncope, orthopnea, and PND.  Home Medications    Prior to Admission medications   Medication Sig Start Date End Date Taking? Authorizing Provider  amLODipine (NORVASC) 5 MG tablet TAKE 1 TABLET BY MOUTH EVERY DAY 06/27/20   Vincent Lukes, MD  Calcium 600 MG tablet Take 1 tablet (600 mg total) by mouth 2 (two) times daily. 01/30/11   Burnice Logan, MD  ELIQUIS 2.5 MG TABS tablet TAKE 1 TABLET BY MOUTH TWICE A DAY 10/09/20   Vincent Lukes, MD  furosemide (LASIX) 20 MG tablet TAKE 1 TABLET BY MOUTH DAILY AS NEEDED FOR EDEMA (WEIGHT GAIN >3LBS IN 24 HRS, SOB). 09/29/20   Vincent Lukes, MD  glipiZIDE (GLUCOTROL) 10 MG tablet TAKE 2 TABLETS (20 MG TOTAL) BY MOUTH 2 (TWO) TIMES DAILY BEFORE A MEAL. 05/29/20   Vincent Lukes, MD  glucose blood (ONE TOUCH ULTRA TEST) test strip USE 1 STRIP 2 TIMES DAILY TO CHECK BLOOD SUGAR DX E08.22, N18.3 11/03/18   Vincent Lukes, MD  Krill Oil 1000 MG CAPS Take 1 capsule by mouth daily.    [provider]  metoprolol succinate (TOPROL-XL) 25 MG 24 hr tablet TAKE 1 TABLET BY MOUTH EVERY DAY 08/22/20   Vincent Lukes, MD  sitaGLIPtin (JANUVIA) 50 MG tablet Take 1 tablet (50 mg total) by mouth daily. 07/18/20   Vincent Lukes, MD  triamcinolone cream (KENALOG) 0.1 % Apply 1 application topically 2 (two) times daily. 08/29/20   Vincent Lukes, MD  triamterene-hydrochlorothiazide (MAXZIDE) 75-50 MG tablet TAKE 1 TABLET BY MOUTH EVERY DAY 05/22/20   Vincent Lukes, MD  Vitamin D, Cholecalciferol, 25 MCG (1000 UT) CAPS Take 1 capsule by mouth daily.    [provider]  Zinc 50 MG TABS Take 1 tablet by mouth daily.    [provider]    Family History     Family History  Problem Relation Age of Onset   Cancer Mother  colon, breast, pancreas, skin cancer   Heart disease Father        heart valve replaced   Stroke Father    Diabetes Father    Cancer Paternal Grandmother        colon   Obesity Son    Heart disease Son        bradycardia   He indicated that his mother is deceased. He indicated that his father is deceased. He indicated that his maternal grandmother is deceased. He indicated that his maternal grandfather is deceased. He indicated that his paternal grandmother is deceased. He indicated that his paternal grandfather is deceased. He indicated that his daughter is alive. He indicated that only one of his two sons is alive.  Social History    Social History   Socioeconomic History   Marital status: Widowed    Spouse name: Not on file   Number of children: Not on file   Years of education: Not on file   Highest education level: Not on file  Occupational History   Not on file  Tobacco Use   Smoking status: Never   Smokeless tobacco: Never  Vaping Use   Vaping Use: Never used  Substance and Sexual Activity   Alcohol use: Yes    Alcohol/week: 1.0 standard drink    Types: 1 Glasses of wine per week   Drug use: No   Sexual activity: Not on file    Comment: lives alone , no major dietary restrictions, 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.  Other Topics Concern   Not on file  Social History Narrative   Not on file   Social Determinants of Health   Financial Resource Strain: Medium Risk   Difficulty of Paying Living Expenses: Somewhat hard  Food Insecurity: Not on file  Transportation Needs: No Transportation Needs   Lack of Transportation (Medical): No   Lack of Transportation (Non-Medical): No  Physical Activity: Inactive   Days of Exercise per Week: 0 days   Minutes of Exercise per Session: 0 min  Stress: Not on file  Social Connections: Not on file  Intimate Partner Violence:  Not on file     Review of Systems    General:  No chills, fever, night sweats or weight changes.  Cardiovascular:  No chest pain, dyspnea on exertion, edema, orthopnea, palpitations, paroxysmal nocturnal dyspnea. Dermatological: No rash, lesions/masses Respiratory: No cough, dyspnea Urologic: No hematuria, dysuria Abdominal:   No nausea, vomiting, diarrhea, bright red blood per rectum, melena, or hematemesis Neurologic:  No visual changes, wkns, changes in mental status. All other systems reviewed and are otherwise negative except as noted above.  Physical Exam    VS:  BP 126/76 (BP Location: Left Arm, Patient Position: Sitting, Cuff Size: Normal)   Pulse 66   Ht 6' (1.829 m)   Wt 220 lb 9.6 oz (100.1 kg)   SpO2 93%   BMI 29.92 kg/m  , BMI Body mass index is 29.92 kg/m. GEN: Well nourished, well developed, in no acute distress. HEENT: normal. Neck: Supple, no JVD, carotid bruits, or masses. Cardiac: RRR, no murmurs, rubs, or gallops. No clubbing, cyanosis, edema.  Radials/DP/PT 2+ and equal bilaterally.  Respiratory:  Respirations regular and unlabored, clear to auscultation bilaterally. GI: Soft, nontender, nondistended, BS + x 4. MS: no deformity or atrophy. Skin: warm and dry, no rash. Neuro:  Strength and sensation are intact. Psych: Normal affect.  Accessory Clinical Findings    Recent Labs: 08/10/2020:  Hemoglobin 13.5; Platelets 158.0; TSH 4.67 09/15/2020: ALT 17; BUN 34; Creatinine, Ser 1.69; Potassium 3.8; Sodium 139   Recent Lipid Panel    Component Value Date/Time   CHOL 145 08/10/2020 1513   TRIG 98.0 08/10/2020 1513   HDL 33.00 (L) 08/10/2020 1513   CHOLHDL 4 08/10/2020 1513   VLDL 19.6 08/10/2020 1513   LDLCALC 92 08/10/2020 1513   LDLCALC 101 (H) 12/16/2019 1543   LDLDIRECT 65.0 11/03/2018 1038    ECG personally reviewed by me today-none today.  EKG 09/21/2019 Demand pacemaker, sinus rhythm with A-V dissociation and wide complex QRS axis with  occasional PVCs and fusion complexes left axis deviation nonspecific intraventricular block lateral infarct undetermined age, inferior infarct undetermined age 70 bpm   EKG 05/11/2018 V-paced 75 bpm   Echocardiogram 07/10/2016 Study Conclusions   - Left ventricle: The cavity size was normal. Wall thickness was    normal. The estimated ejection fraction was 55%. Indeterminant    diastolic function (atrial fibrillation). Although no diagnostic    regional wall motion abnormality was identified, this possibility    cannot be completely excluded on the basis of this study.  - Aortic valve: There was no stenosis.  - Mitral valve: Mildly calcified annulus. Mildly calcified leaflets    . There was mild regurgitation.  - Left atrium: The atrium was moderately dilated.  - Right ventricle: The cavity size was mildly dilated. Systolic    function was normal.  - Tricuspid valve: Peak RV-RA gradient (S): 46 mm Hg.  - Pulmonary arteries: PA peak pressure: 54 mm Hg (S).  - Systemic veins: IVC measured 2.1 cm with > 50% respirophasic    variation, suggesting RA pressure 8 mmHg.      Echocardiogram 09/14/2020 IMPRESSIONS     1. Left ventricular ejection fraction, by estimation, is 40 to 45%. The  left ventricle has mildly decreased function. The left ventricle has no  regional wall motion abnormalities. Left ventricular diastolic function  could not be evaluated.   2. The mitral valve is normal in structure. Moderate mitral valve  regurgitation. No evidence of mitral stenosis.   3. The aortic valve is normal in structure. Aortic valve regurgitation is  not visualized. Mild aortic valve sclerosis is present, with no evidence  of aortic valve stenosis.   4. The inferior vena cava is normal in size with greater than 50%  respiratory variability, suggesting right atrial pressure of 3 mmHg.  Assessment & Plan   1.  Diastolic CHF-weight today 220.9 pounds down from 236 pounds.  Has +1 bilateral  pitting edema to mid shin.  No increased DOE. Continue metoprolol, Maxide, furosemide Heart healthy low-sodium diet-salty 6 given Increase physical activity as tolerated    Persistent atrial fibrillation/LBBB-heart rate today 66.  CHA2DS2-VASc score 4.  Noticed 3 episodes of accelerated heart rate.  Last episode of increased heart rate was more than 1 month ago.  Labs from 09/14/2019 unremarkable. Continue Eliquis, metoprolol Continue to monitor. Followed by EP  Essential hypertension-BP today 126/76.  Well-controlled at home Continue amlodipine, metoprolol, Maxide Heart healthy low-sodium diet-salty 6 given Increase physical activity as tolerated   Hyperlipidemia-LDL 79 on 09/14/2019 Continue atorvastatin Heart healthy low-sodium high-fiber diet Increase physical activity as tolerated Repeat with PCP fasting lipids and LFTs  Disposition: Follow-up with Vincent Allen in 6 months.   Jossie Ng. Blessed Cotham NP-C    10/20/2020, 2:21 PM [place] [address] Office [phone] Fax [phone]  Notice: This  dictation was prepared with Dragon dictation along with smaller phrase technology. Any transcriptional errors that result from this process are unintentional and may not be corrected upon review.  I spent 14 minutes examining this patient, reviewing medications, and using patient centered shared decision making involving her cardiac care.  Prior to her visit I spent greater than 20 minutes reviewing her past medical history,  medications, and prior cardiac tests.

## 2020-10-20 ENCOUNTER — Encounter: Payer: Self-pay | Admitting: General Practice

## 2020-10-20 ENCOUNTER — Other Ambulatory Visit: Payer: Self-pay

## 2020-10-20 ENCOUNTER — Ambulatory Visit: Payer: PPO | Admitting: General Practice

## 2020-10-20 VITALS — BP 126/76 | HR 66 | Ht 72.0 in | Wt 220.6 lb

## 2020-10-20 DIAGNOSIS — I4819 Other persistent atrial fibrillation: Secondary | ICD-10-CM

## 2020-10-20 DIAGNOSIS — I5043 Acute on chronic combined systolic (congestive) and diastolic (congestive) heart failure: Secondary | ICD-10-CM

## 2020-10-20 DIAGNOSIS — E785 Hyperlipidemia, unspecified: Secondary | ICD-10-CM

## 2020-10-20 DIAGNOSIS — I10 Essential (primary) hypertension: Secondary | ICD-10-CM

## 2020-10-20 NOTE — Progress Notes (Signed)
Remote pacemaker transmission.   

## 2020-10-20 NOTE — Patient Instructions (Signed)
Medication Instructions:  OK TO TAKE FUROSEMIDE AS NEEDED *If you need a refill on your cardiac medications before your next appointment, please call your pharmacy*  Lab Work: NONE  Special Instructions MAKE SURE TO CALL YOUR [place] MD TO DISCUSS B VITAMINS FOR FATIGUE AND FASTING CHOLESTEROLLABS  PLEASE READ AND FOLLOW SALTY 6-ATTACHED-1,800 mg daily  PLEASE INCREASE PHYSICAL ACTIVITY AS TOLERATED-MAKE SURE TO DO SOME PHYSICAL ACTIVITY DAILY.  SEE CHAIR EXERCISES TO DO DAILY  Follow-Up: Your next appointment:  6 month(s) In Person with K. Mali Hilty, MD ONLY   At [facility], you and your health needs are our priority.  As part of our continuing mission to provide you with exceptional heart care, we have created designated Provider Care Teams.  These Care Teams include your primary Cardiologist (physician) and Advanced Practice Providers (APPs -  Physician Assistants and Nurse Practitioners) who all work together to provide you with the care you need, when you need it.         Exercises to do While Sitting  Exercises that you do while sitting (chair exercises) can give you many of the same benefits as full exercise. Benefits include strengthening your heart, burning calories, and keeping muscles and joints healthy. Exercise can also improve your mood and help with depression andanxiety. You may benefit from chair exercises if you are unable to do standing exercises because of: Diabetic foot pain. Obesity. Illness. Arthritis. Recovery from surgery or injury. Breathing problems. Balance problems. Another type of disability. Before starting chair exercises, check with your health care provider or a physical therapist to find out how much exercise you can tolerate and which exercises are safe for you. If your health care provider approves: Start out slowly and build up over time. Aim to work up to about 10-20 minutes for each exercise session. Make exercise part of your  daily routine. Drink water when you exercise. Do not wait until you are thirsty. Drink every 10-15 minutes. Stop exercising right away if you have pain, nausea, shortness of breath, or dizziness. If you are exercising in a wheelchair, make sure to lock the wheels. Ask your health care provider whether you can do tai chi or yoga. Many positions in these mind-body exercises can be modified to do while seated. Warm-up Before starting other exercises: Sit up as straight as you can. Have your knees bent at 90 degrees, which is the shape of the capital letter "L." Keep your feet flat on the floor. Sit at the front edge of your chair, if you can. Pull in (tighten) the muscles in your abdomen and stretch your spine and neck as straight as you can. Hold this position for a few minutes. Breathe in and out evenly. Try to concentrate on your breathing, and relax your mind. Stretching Exercise A: Arm stretch Hold your arms out straight in front of your body. Bend your hands at the wrist with your fingers pointing up, as if signaling someone to stop. Notice the slight tension in your forearms as you hold the position. Keeping your arms out and your hands bent, rotate your hands outward as far as you can and hold this stretch. Aim to have your thumbs pointing up and your pinkie fingers pointing down. Slowly repeat arm stretches for one minute as tolerated. Exercise B: Leg stretch If you can move your legs, try to "draw" letters on the floor with the toes of your foot. Write your name with one foot. Write your name with the toes  of your other foot. Slowly repeat the movements for one minute as tolerated. Exercise C: Reach for the sky Reach your hands as far over your head as you can to stretch your spine. Move your hands and arms as if you are climbing a rope. Slowly repeat the movements for one minute as tolerated. Range of motion exercises Exercise A: Shoulder roll Let your arms hang loosely at your  sides. Lift just your shoulders up toward your ears, then let them relax back down. When your shoulders feel loose, rotate your shoulders in backward and forward circles. Do shoulder rolls slowly for one minute as tolerated. Exercise B: March in place As if you are marching, pump your arms and lift your legs up and down. Lift your knees as high as you can. If you are unable to lift your knees, just pump your arms and move your ankles and feet up and down. March in place for one minute as tolerated. Exercise C: Seated jumping jacks Let your arms hang down straight. Keeping your arms straight, lift them up over your head. Aim to point your fingers to the ceiling. While you lift your arms, straighten your legs and slide your heels along the floor to your sides, as wide as you can. As you bring your arms back down to your sides, slide your legs back together. If you are unable to use your legs, just move your arms. Slowly repeat seated jumping jacks for one minute as tolerated. Strengthening exercises Exercise A: Shoulder squeeze Hold your arms straight out from your body to your sides, with your elbows bent and your fists pointed at the ceiling. Keeping your arms in the bent position, move them forward so your elbows and forearms meet in front of your face. Open your arms back out as wide as you can with your elbows still bent, until you feel your shoulder blades squeezing together. Hold for 5 seconds. Slowly repeat the movements forward and backward for one minute as tolerated. Contact a health care provider if you: Had to stop exercising due to any of the following: Pain. Nausea. Shortness of breath. Dizziness. Fatigue. Have significant pain or soreness after exercising. Get help right away if you have: Chest pain. Difficulty breathing. These symptoms may represent a serious problem that is an emergency. Do not wait to see if the symptoms will go away. Get medical help right away. Call  your local emergency services (911 in the [place] Do not drive yourself to the hospital. This information is not intended to replace advice given to you by your health care provider. Make sure you discuss any questions you have with your healthcare provider. Document Revised: 06/14/2019 Document Reviewed: 07/01/2019 Elsevier Patient Education  2022 [employer].

## 2020-10-26 ENCOUNTER — Encounter: Payer: Self-pay | Admitting: Family Medicine

## 2020-10-30 DIAGNOSIS — D044 Carcinoma in situ of skin of scalp and neck: Secondary | ICD-10-CM | POA: Diagnosis not present

## 2020-10-30 DIAGNOSIS — Z85828 Personal history of other malignant neoplasm of skin: Secondary | ICD-10-CM | POA: Diagnosis not present

## 2020-10-30 DIAGNOSIS — C4442 Squamous cell carcinoma of skin of scalp and neck: Secondary | ICD-10-CM | POA: Diagnosis not present

## 2020-11-04 ENCOUNTER — Other Ambulatory Visit: Payer: Self-pay | Admitting: Family Medicine

## 2020-11-09 ENCOUNTER — Other Ambulatory Visit: Payer: Self-pay | Admitting: Family Medicine

## 2020-11-13 DIAGNOSIS — L570 Actinic keratosis: Secondary | ICD-10-CM | POA: Diagnosis not present

## 2020-11-16 ENCOUNTER — Other Ambulatory Visit: Payer: Self-pay | Admitting: Family Medicine

## 2020-11-27 ENCOUNTER — Other Ambulatory Visit: Payer: Self-pay | Admitting: Family Medicine

## 2020-11-27 ENCOUNTER — Other Ambulatory Visit: Payer: Self-pay

## 2020-11-27 ENCOUNTER — Encounter: Payer: Self-pay | Admitting: Podiatry

## 2020-11-27 ENCOUNTER — Ambulatory Visit: Payer: PPO | Admitting: Podiatry

## 2020-11-27 DIAGNOSIS — M79674 Pain in right toe(s): Secondary | ICD-10-CM | POA: Diagnosis not present

## 2020-11-27 DIAGNOSIS — E0822 Diabetes mellitus due to underlying condition with diabetic chronic kidney disease: Secondary | ICD-10-CM

## 2020-11-27 DIAGNOSIS — M79675 Pain in left toe(s): Secondary | ICD-10-CM

## 2020-11-27 DIAGNOSIS — N183 Chronic kidney disease, stage 3 unspecified: Secondary | ICD-10-CM | POA: Diagnosis not present

## 2020-11-27 DIAGNOSIS — B351 Tinea unguium: Secondary | ICD-10-CM

## 2020-11-30 ENCOUNTER — Encounter: Payer: Self-pay | Admitting: Family Medicine

## 2020-11-30 ENCOUNTER — Other Ambulatory Visit: Payer: Self-pay

## 2020-11-30 ENCOUNTER — Ambulatory Visit (INDEPENDENT_AMBULATORY_CARE_PROVIDER_SITE_OTHER): Payer: PPO | Admitting: Family Medicine

## 2020-11-30 VITALS — BP 130/75 | HR 79 | Temp 98.0°F | Resp 16 | Wt 228.2 lb

## 2020-11-30 DIAGNOSIS — E785 Hyperlipidemia, unspecified: Secondary | ICD-10-CM

## 2020-11-30 DIAGNOSIS — N183 Chronic kidney disease, stage 3 unspecified: Secondary | ICD-10-CM | POA: Diagnosis not present

## 2020-11-30 DIAGNOSIS — R351 Nocturia: Secondary | ICD-10-CM

## 2020-11-30 DIAGNOSIS — E559 Vitamin D deficiency, unspecified: Secondary | ICD-10-CM

## 2020-11-30 DIAGNOSIS — I10 Essential (primary) hypertension: Secondary | ICD-10-CM | POA: Diagnosis not present

## 2020-11-30 DIAGNOSIS — K760 Fatty (change of) liver, not elsewhere classified: Secondary | ICD-10-CM | POA: Diagnosis not present

## 2020-11-30 DIAGNOSIS — I4819 Other persistent atrial fibrillation: Secondary | ICD-10-CM | POA: Diagnosis not present

## 2020-11-30 DIAGNOSIS — I119 Hypertensive heart disease without heart failure: Secondary | ICD-10-CM | POA: Diagnosis not present

## 2020-11-30 DIAGNOSIS — E0822 Diabetes mellitus due to underlying condition with diabetic chronic kidney disease: Secondary | ICD-10-CM

## 2020-11-30 MED ORDER — FUROSEMIDE 20 MG PO TABS: ORAL_TABLET | ORAL | 2 refills | Status: DC

## 2020-11-30 NOTE — Progress Notes (Signed)
  Subjective:  Patient ID: Vincent Allen, male    DOB: 27-Sep-1933,  MRN: [id number]  87 y.o. male presents with at risk foot care. Pt has h/o NIDDM with chronic kidney disease and thick, elongated toenails 1-5 bilaterally which are tender when wearing enclosed shoe gear..    Patient's blood sugar was 81 mg/dl today.  Last A1c was 6.8%.  PCP: [name], MD and last visit was: 08/10/2020.  Review of Systems: Negative except as noted in the HPI.   Allergies  Allergen Reactions   Atorvastatin     Myalgias / leg pain and weakness   Penicillins Hives and Itching    Has patient had a PCN reaction causing immediate rash, facial/tongue/throat swelling, SOB or lightheadedness with hypotension:Yes Has patient had a PCN reaction causing severe rash involving mucus membranes or skin necrosis:No Has patient had a PCN reaction that required hospitalization:No Has patient had a PCN reaction occurring within the last 10 years:No If all of the above answers are "NO", then may proceed with Cephalosporin use.    Verapamil     Junctional rhythm   Influenza Vaccines Rash    tinnitus    Objective:  There were no vitals filed for this visit. Constitutional Patient is a pleasant 87 y.o. Caucasian male obese in NAD. AAO x 3.  Vascular Capillary fill time to digits immediate b/l.  DP/PT pulse(s) are palpable b/l lower extremities. Pedal hair sparse. Lower extremity skin temperature gradient within normal limits. No pain with calf compression b/l. No edema noted b/l lower extremities. No cyanosis or clubbing noted.  Neurologic Protective sensation intact 5/5 intact bilaterally with 10g monofilament b/l. Vibratory sensation intact b/l. No clonus b/l.  Dermatologic Pedal skin is warm and supple b/l.  No open wounds b/l lower extremities. No interdigital macerations b/l lower extremities. Toenails 1-5 b/l elongated, discolored, dystrophic, thickened, crumbly with subungual debris and tenderness to dorsal  palpation.   Orthopedic: Normal muscle strength 5/5 to all lower extremity muscle groups bilaterally. Utilizes cane for ambulation assistance.   Hemoglobin A1C Latest Ref Rng & Units 08/10/2020 04/20/2020 12/16/2019  HGBA1C 4.6 - 6.5 % 6.8(H) 7.0(H) 7.2(H)  Some recent data might be hidden   Assessment:   1. Pain due to onychomycosis of toenails of both feet   2. Diabetes mellitus due to underlying condition with stage 3 chronic kidney disease, unspecified whether long term insulin use, unspecified whether stage 3a or 3b CKD ([place])    Plan:  Patient was evaluated and treated and all questions answered. Consent given for treatment as described below: -No new findings. No new orders. -Patient to continue soft, supportive shoe gear daily. -Toenails 1-5 b/l were debrided in length and girth with sterile nail nippers and dremel without iatrogenic bleeding.  -Patient to report any pedal injuries to medical professional immediately. -Patient/POA to call should there be question/concern in the interim.  Return in about 3 months (around 02/26/2021).  Marzetta Board, DPM

## 2020-11-30 NOTE — Progress Notes (Signed)
Subjective:   By signing my name below, I, [name], attest that this documentation has been prepared under the direction and in the presence of 11/30/2020   Patient ID: Vincent Allen, male    DOB: 09/22/1933, 87 y.o.   MRN: [id number]  Chief Complaint  Patient presents with   Follow-up    HPI Patient with an hx of bradycardia, atrial fibrillation, GERD, hypertension and diabetes is presented today for an office visit and other chronic medical concerns. He is accompanied by his son.His son  stated his blood sugars at home has been stable. He experiences occasional fatigue at rest and shortness of breath when walking. He also reports having LE edema and rashes on both legs. His son includes a couple days ago, his legs has some drainage and the rashes are worsening.[facility] cardiology told him to take Lasix 20 mg once a day and has been helpful. He also included he naps during the day however at night, he doesn't sleep much due to having to urinate frequently every 2-3 hours. He denies  He drinks 1-2 of  the little bottles of water everyday. He cooks at home and goes out to eat however he tries to watch his salt intake. He eats pre-cooked bacon and tenderloins but he likes to eat country ham. Declines shingrix, COVID-19 and Flu vaccinations at this time.    Patient denies chest pains, palpitations, chest tightness, blood in stool, dizziness, headaches, syncope, GU or GI c/o.  Past Medical History:  Diagnosis Date   Arthritis    Bradycardia    Cataract    Chronic kidney disease stage III (GFR 30-59 ml/min) 05/03/2012   Colon polyps    Complication of anesthesia    emesis   Diabetes mellitus    GERD (gastroesophageal reflux disease)    HOH (hard of hearing)    HTN (hypertension) 01/13/2013   Hyperlipidemia    Hypertension    Pacemaker 12/2016   Persistent atrial fibrillation ([place])    Presence of permanent cardiac pacemaker 09/20/2016   Symptomatic bradycardia    Vitamin D  deficiency 07/17/2015    Past Surgical History:  Procedure Laterality Date   ANKLE ARTHROPLASTY     CARDIOVERSION N/A 08/20/2016   Procedure: CARDIOVERSION;  Surgeon: Sanda Klein, MD;  Location: [place] [facility];  Service: Cardiovascular;  Laterality: N/A;   CATARACT EXTRACTION  2010, 2014   CATARACT EXTRACTION  02/28/14   CHOLECYSTECTOMY     CHOLECYSTECTOMY N/A 05/03/2012   Procedure: LAPAROSCOPIC CHOLECYSTECTOMY WITH INTRAOPERATIVE CHOLANGIOGRAM;  Surgeon: [name], MD;  Location: [place];  Service: General;  Laterality: N/A;   INSERT / REPLACE / REMOVE PACEMAKER  09/20/2016   PACEMAKER IMPLANT N/A 09/20/2016   Procedure: Pacemaker Implant;  Surgeon: Deboraha Sprang, MD;  Location: [place] [facility];  Service: Cardiovascular;  Laterality: N/A;   ROTATOR CUFF REPAIR     SHOULDER SURGERY     TONSILLECTOMY      Family History  Problem Relation Age of Onset   Cancer Mother        colon, breast, pancreas, skin cancer   Heart disease Father        heart valve replaced   Stroke Father    Diabetes Father    Cancer Paternal Grandmother        colon   Obesity Son    Heart disease Son        bradycardia    Social History   Socioeconomic History  Marital status: Widowed    Spouse name: Not on file   Number of children: Not on file   Years of education: Not on file   Highest education level: Not on file  Occupational History   Not on file  Tobacco Use   Smoking status: Never   Smokeless tobacco: Never  Vaping Use   Vaping Use: Never used  Substance and Sexual Activity   Alcohol use: Yes    Alcohol/week: 1.0 standard drink    Types: 1 Glasses of wine per week   Drug use: No   Sexual activity: Not on file    Comment: lives alone , no major dietary restrictions, 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.  Other Topics Concern   Not on file  Social History Narrative   Not on file   Social Determinants of Health   Financial Resource Strain: Medium Risk    Difficulty of Paying Living Expenses: Somewhat hard  Food Insecurity: Not on file  Transportation Needs: No Transportation Needs   Lack of Transportation (Medical): No   Lack of Transportation (Non-Medical): No  Physical Activity: Inactive   Days of Exercise per Week: 0 days   Minutes of Exercise per Session: 0 min  Stress: Not on file  Social Connections: Not on file  Intimate Partner Violence: Not on file    Outpatient Medications Prior to Visit  Medication Sig Dispense Refill   amLODipine (NORVASC) 5 MG tablet TAKE 1 TABLET BY MOUTH EVERY DAY 90 tablet 1   Calcium 600 MG tablet Take 1 tablet (600 mg total) by mouth 2 (two) times daily. 60 tablet 0   ELIQUIS 2.5 MG TABS tablet TAKE 1 TABLET BY MOUTH TWICE A DAY 60 tablet 5   glipiZIDE (GLUCOTROL) 10 MG tablet TAKE 2 TABLETS (20 MG TOTAL) BY MOUTH 2 (TWO) TIMES DAILY BEFORE A MEAL. 360 tablet 1   glucose blood (ONE TOUCH ULTRA TEST) test strip USE 1 STRIP 2 TIMES DAILY TO CHECK BLOOD SUGAR DX E08.22, N18.3 300 each 1   JANUVIA 50 MG tablet TAKE 1 TABLET BY MOUTH EVERY DAY 30 tablet 3   Krill Oil 1000 MG CAPS Take 1 capsule by mouth daily.     metoprolol succinate (TOPROL-XL) 25 MG 24 hr tablet TAKE 1 TABLET BY MOUTH EVERY DAY 90 tablet 1   mupirocin ointment (BACTROBAN) 2 % Apply topically daily.     triamcinolone cream (KENALOG) 0.1 % Apply 1 application topically 2 (two) times daily. 80 g 0   triamterene-hydrochlorothiazide (MAXZIDE) 75-50 MG tablet TAKE 1 TABLET BY MOUTH EVERY DAY 90 tablet 1   Vitamin D, Cholecalciferol, 25 MCG (1000 UT) CAPS Take 1 capsule by mouth daily.     Zinc 50 MG TABS Take 1 tablet by mouth daily.     furosemide (LASIX) 20 MG tablet TAKE 1 TABLET BY MOUTH DAILY AS NEEDED FOR EDEMA (WEIGHT GAIN >3LBS IN 24 HRS, SOB). 30 tablet 1   No facility-administered medications prior to visit.    Allergies  Allergen Reactions   Atorvastatin     Myalgias / leg pain and weakness   Penicillins Hives and Itching     Has patient had a PCN reaction causing immediate rash, facial/tongue/throat swelling, SOB or lightheadedness with hypotension:Yes Has patient had a PCN reaction causing severe rash involving mucus membranes or skin necrosis:No Has patient had a PCN reaction that required hospitalization:No Has patient had a PCN reaction occurring within the last 10 years:No If  all of the above answers are "NO", then may proceed with Cephalosporin use.    Verapamil     Junctional rhythm   Influenza Vaccines Rash    tinnitus    Review of Systems  Constitutional:  Positive for malaise/fatigue. Negative for chills and fever.  HENT:  Negative for congestion, ear pain and sinus pain.   Eyes:  Negative for blurred vision, pain and redness.  Respiratory:  Positive for shortness of breath. Negative for cough and sputum production.   Cardiovascular:  Positive for leg swelling. Negative for chest pain and palpitations.  Gastrointestinal:  Negative for abdominal pain, blood in stool and nausea.  Genitourinary:  Positive for frequency. Negative for dysuria, hematuria and urgency.  Musculoskeletal:  Negative for falls, joint pain and myalgias.  Skin:  Positive for rash (on both legs/drainage).  Neurological:  Negative for dizziness, weakness and headaches.  Psychiatric/Behavioral:  Negative for memory loss. The patient is not nervous/anxious and does not have insomnia.       Objective:    Physical Exam Constitutional:      Appearance: Normal appearance. He is not ill-appearing or toxic-appearing.  HENT:     Head: Normocephalic and atraumatic.     Right Ear: Tympanic membrane and external ear normal.     Left Ear: Tympanic membrane and external ear normal.     Nose: Nose normal.  Eyes:     General: No scleral icterus.    Extraocular Movements: Extraocular movements intact.     Pupils: Pupils are equal, round, and reactive to light.  Cardiovascular:     Rate and Rhythm: Normal rate and regular rhythm.      Pulses: Normal pulses.     Heart sounds: Normal heart sounds. No murmur heard. Pulmonary:     Effort: Pulmonary effort is normal.     Breath sounds: Normal breath sounds.  Abdominal:     General: Abdomen is flat.     Palpations: Abdomen is soft. There is no mass.     Tenderness: There is no right CVA tenderness or left CVA tenderness.  Musculoskeletal:        General: No swelling.     Cervical back: Normal range of motion and neck supple.     Right lower leg: Edema present.     Left lower leg: Edema present.  Lymphadenopathy:     Cervical: No cervical adenopathy.  Skin:    General: Skin is warm and dry.  Neurological:     Mental Status: He is alert and oriented to person, place, and time.     Deep Tendon Reflexes: Reflexes normal.  Psychiatric:        Mood and Affect: Mood normal.        Behavior: Behavior normal.    BP 130/75   Pulse 79   Temp 98 F (36.7 C)   Resp 16   Wt 228 lb 3.2 oz (103.5 kg)   SpO2 100%   BMI 30.95 kg/m  Wt Readings from Last 3 Encounters:  11/30/20 228 lb 3.2 oz (103.5 kg)  10/20/20 220 lb 9.6 oz (100.1 kg)  09/07/20 236 lb (107 kg)    Diabetic Foot Exam - Simple   No data filed    Lab Results  Component Value Date   WBC 6.7 11/30/2020   HGB 13.8 11/30/2020   HCT 43.0 11/30/2020   PLT 142.0 (L) 11/30/2020   GLUCOSE 139 (H) 11/30/2020   CHOL 141 11/30/2020   TRIG 90.0 11/30/2020  HDL 36.70 (L) 11/30/2020   LDLDIRECT 65.0 11/03/2018   LDLCALC 86 11/30/2020   ALT 16 11/30/2020   AST 23 11/30/2020   NA 136 11/30/2020   K 4.3 11/30/2020   CL 101 11/30/2020   CREATININE 1.71 (H) 11/30/2020   BUN 43 (H) 11/30/2020   CO2 23 11/30/2020   TSH 4.55 11/30/2020   PSA 1.18 10/07/2012   INR 1.0 08/15/2016   HGBA1C 6.3 11/30/2020   MICROALBUR 26.7 (H) 07/15/2016    Lab Results  Component Value Date   TSH 4.55 11/30/2020   Lab Results  Component Value Date   WBC 6.7 11/30/2020   HGB 13.8 11/30/2020   HCT 43.0 11/30/2020   MCV  85.8 11/30/2020   PLT 142.0 (L) 11/30/2020   Lab Results  Component Value Date   NA 136 11/30/2020   K 4.3 11/30/2020   CO2 23 11/30/2020   GLUCOSE 139 (H) 11/30/2020   BUN 43 (H) 11/30/2020   CREATININE 1.71 (H) 11/30/2020   BILITOT 2.0 (H) 11/30/2020   ALKPHOS 125 (H) 11/30/2020   AST 23 11/30/2020   ALT 16 11/30/2020   PROT 6.9 11/30/2020   ALBUMIN 3.7 11/30/2020   CALCIUM 9.5 11/30/2020   ANIONGAP 8 07/10/2016   GFR 36.09 (L) 11/30/2020   Lab Results  Component Value Date   CHOL 141 11/30/2020   Lab Results  Component Value Date   HDL 36.70 (L) 11/30/2020   Lab Results  Component Value Date   LDLCALC 86 11/30/2020   Lab Results  Component Value Date   TRIG 90.0 11/30/2020   Lab Results  Component Value Date   CHOLHDL 4 11/30/2020   Lab Results  Component Value Date   HGBA1C 6.3 11/30/2020       Assessment & Plan:   Problem List Items Addressed This Visit     Diabetes mellitus with chronic kidney disease ([place]) - Primary (Chronic)    hgba1c acceptable, minimize simple carbs. Increase exercise as tolerated. Continue current meds      Relevant Orders   Hemoglobin A1c (Completed)   Hypertensive heart disease (Chronic)   Relevant Medications   furosemide (LASIX) 20 MG tablet   Hyperlipidemia LDL goal <70 (Chronic)    Encourage heart healthy diet such as MIND or DASH diet, increase exercise, avoid trans fats, simple carbohydrates and processed foods, consider a krill or fish or flaxseed oil cap daily.       Relevant Medications   furosemide (LASIX) 20 MG tablet   Other Relevant Orders   Lipid panel (Completed)   Chronic kidney disease stage III (GFR 30-59 ml/min) (Chronic)    Hydrate and monitor.       Essential hypertension    Well controlled, no changes to meds. Encouraged heart healthy diet such as the DASH diet and exercise as tolerated.       Relevant Medications   furosemide (LASIX) 20 MG tablet   Other Relevant Orders   CBC  (Completed)   Comprehensive metabolic panel (Completed)   TSH (Completed)   Vitamin D deficiency    Supplement and monitor      Persistent atrial fibrillation ([facility])    Rate controlled, tolerating Eliquis      Relevant Medications   furosemide (LASIX) 20 MG tablet   Morbid obesity ([place])    Encouraged DASH or MIND diet, decrease po intake and increase exercise as tolerated. Needs 7-8 hours of sleep nightly. Avoid trans fats, eat small, frequent meals every 4-5 hours with  lean proteins, complex carbs and healthy fats. Minimize simple carbs, high fat foods and processed foods      Fatty liver    With Ultrasound confirmation and abnormal labs. Minimize simple carbs and stay as active as able      Other Visit Diagnoses     Nocturia       Relevant Orders   Ambulatory referral to Urology      F/U in 4 months  Meds ordered this encounter  Medications   furosemide (LASIX) 20 MG tablet    Sig: TAKE 1-2 TABLETs BY MOUTH DAILY AS NEEDED FOR EDEMA (WEIGHT GAIN >3LBS IN 24 HRS, SOB).    Dispense:  60 tablet    Refill:  2    I, [name], MD, personally preformed the services described in this documentation.  All medical record entries made by the scribe were at my direction and in my presence.  I have reviewed the chart and discharge instructions (if applicable) and agree that the record reflects my personal performance and is accurate and complete. [name] 11/30/2020   I,Jada Bradford,acting as a scribe for [name], MD.,have documented all relevant documentation on the behalf of [name], MD,as directed by  [name], MD while in the presence of [name], MD.  I, [name], MD personally performed the services described in this documentation. All medical record entries made by the scribe were at my direction and in my presence. I have reviewed the chart and agree that the record reflects my personal performance and is accurate and complete     [name], MD

## 2020-11-30 NOTE — Patient Instructions (Addendum)
[facility]    Paxlovid is the new COVID medication we can give you if you get COVID so make sure you test if you have symptoms because we have to treat by day 5 of symptoms for it to be effective. If you are positive let us know so we can treat. If a home test is negative and your symptoms are persistent get a PCR test. Can check testing locations at [email or web address] If you are positive we will make an appointment with [place] and we will send in Paxlovid if you would like it. Check with your pharmacy before we meet to confirm they have it in stock, if they do not then we can get the prescription at the [facility]

## 2020-12-01 DIAGNOSIS — K760 Fatty (change of) liver, not elsewhere classified: Secondary | ICD-10-CM | POA: Insufficient documentation

## 2020-12-01 LAB — HEMOGLOBIN A1C: Hgb A1c MFr Bld: 6.3 % (ref 4.6–6.5)

## 2020-12-01 LAB — COMPREHENSIVE METABOLIC PANEL
ALT: 16 U/L (ref 0–53)
AST: 23 U/L (ref 0–37)
Albumin: 3.7 g/dL (ref 3.5–5.2)
Alkaline Phosphatase: 125 U/L — ABNORMAL HIGH (ref 39–117)
BUN: 43 mg/dL — ABNORMAL HIGH (ref 6–23)
CO2: 23 mEq/L (ref 19–32)
Calcium: 9.5 mg/dL (ref 8.4–10.5)
Chloride: 101 mEq/L (ref 96–112)
Creatinine, Ser: 1.71 mg/dL — ABNORMAL HIGH (ref 0.40–1.50)
GFR: 36.09 mL/min — ABNORMAL LOW (ref >60.00)
Glucose, Bld: 139 mg/dL — ABNORMAL HIGH (ref 70–99)
Potassium: 4.3 mEq/L (ref 3.5–5.1)
Sodium: 136 mEq/L (ref 135–145)
Total Bilirubin: 2 mg/dL — ABNORMAL HIGH (ref 0.2–1.2)
Total Protein: 6.9 g/dL (ref 6.0–8.3)

## 2020-12-01 LAB — LIPID PANEL
Cholesterol: 141 mg/dL (ref 0–200)
HDL: 36.7 mg/dL — ABNORMAL LOW (ref >39.00)
LDL Cholesterol: 86 mg/dL (ref 0–99)
NonHDL: 104.35
Total CHOL/HDL Ratio: 4
Triglycerides: 90 mg/dL (ref 0.0–149.0)
VLDL: 18 mg/dL (ref 0.0–40.0)

## 2020-12-01 LAB — TSH: TSH: 4.55 u[IU]/mL (ref 0.35–5.50)

## 2020-12-01 LAB — CBC
HCT: 43 % (ref 39.0–52.0)
Hemoglobin: 13.8 g/dL (ref 13.0–17.0)
MCHC: 32.2 g/dL (ref 30.0–36.0)
MCV: 85.8 fl (ref 78.0–100.0)
Platelets: 142 10*3/uL — ABNORMAL LOW (ref 150.0–400.0)
RBC: 5.01 Mil/uL (ref 4.22–5.81)
RDW: 15.6 % — ABNORMAL HIGH (ref 11.5–15.5)
WBC: 6.7 10*3/uL (ref 4.0–10.5)

## 2020-12-01 NOTE — Assessment & Plan Note (Signed)
Encouraged DASH or MIND diet, decrease po intake and increase exercise as tolerated. Needs 7-8 hours of sleep nightly. Avoid trans fats, eat small, frequent meals every 4-5 hours with lean proteins, complex carbs and healthy fats. Minimize simple carbs, high fat foods and processed foods 

## 2020-12-01 NOTE — Assessment & Plan Note (Signed)
Hydrate and monitor 

## 2020-12-01 NOTE — Assessment & Plan Note (Signed)
Rate controlled, tolerating Eliquis 

## 2020-12-01 NOTE — Assessment & Plan Note (Signed)
Supplement and monitor 

## 2020-12-01 NOTE — Assessment & Plan Note (Signed)
Encourage heart healthy diet such as MIND or DASH diet, increase exercise, avoid trans fats, simple carbohydrates and processed foods, consider a krill or fish or flaxseed oil cap daily.  °

## 2020-12-01 NOTE — Assessment & Plan Note (Signed)
With Ultrasound confirmation and abnormal labs. Minimize simple carbs and stay as active as able

## 2020-12-01 NOTE — Assessment & Plan Note (Signed)
Well controlled, no changes to meds. Encouraged heart healthy diet such as the DASH diet and exercise as tolerated.  °

## 2020-12-01 NOTE — Assessment & Plan Note (Signed)
hgba1c acceptable, minimize simple carbs. Increase exercise as tolerated. Continue current meds 

## 2020-12-19 ENCOUNTER — Other Ambulatory Visit: Payer: Self-pay | Admitting: Family Medicine

## 2020-12-27 ENCOUNTER — Ambulatory Visit: Payer: PPO

## 2020-12-27 ENCOUNTER — Telehealth: Payer: Self-pay

## 2020-12-27 ENCOUNTER — Ambulatory Visit (INDEPENDENT_AMBULATORY_CARE_PROVIDER_SITE_OTHER): Payer: PPO

## 2020-12-27 DIAGNOSIS — I495 Sick sinus syndrome: Secondary | ICD-10-CM

## 2020-12-27 NOTE — Progress Notes (Deleted)
Subjective:   Vincent Allen is a 87 y.o. male who presents for Medicare Annual/Subsequent preventive examination.  I connected with *** today by telephone and verified that I am speaking with the correct person using two identifiers. Location patient: home Location provider: work Persons participating in the virtual visit: patient, Marine scientist.    I discussed the limitations, risks, security and privacy concerns of performing an evaluation and management service by telephone and the availability of in person appointments. I also discussed with the patient that there may be a patient responsible charge related to this service. The patient expressed understanding and verbally consented to this telephonic visit.    Interactive audio and video telecommunications were attempted between this provider and patient, however failed, due to patient having technical difficulties OR patient did not have access to video capability.  We continued and completed visit with audio only.  Some vital signs may be absent or patient reported.   Time Spent with patient on telephone encounter: *** minutes   Review of Systems    ***       Objective:    There were no vitals filed for this visit. There is no height or weight on file to calculate BMI.  Advanced Directives 10/06/2019 11/12/2016 10/05/2016 09/20/2016 09/20/2016 08/20/2016 07/09/2016  Does Patient Have a Medical Advance Directive? Yes Yes No Yes Yes Yes No  Type of Paramedic of [place];Living will [place];Living will - - Living will;Healthcare Power of [place];Living will -  Does patient want to make changes to medical advance directive? No - Patient declined - - No - Patient declined - - -  Copy of [place] in Chart? No - copy requested - - - - - -  Would patient like information on creating a medical advance directive? - - - - - - No - Patient declined   Pre-existing out of facility DNR order (yellow form or pink MOST form) - - - - - - -    Current Medications (verified) Outpatient Encounter Medications as of 12/27/2020  Medication Sig   amLODipine (NORVASC) 5 MG tablet TAKE 1 TABLET BY MOUTH EVERY DAY   Calcium 600 MG tablet Take 1 tablet (600 mg total) by mouth 2 (two) times daily.   ELIQUIS 2.5 MG TABS tablet TAKE 1 TABLET BY MOUTH TWICE A DAY   furosemide (LASIX) 20 MG tablet TAKE 1-2 TABLETs BY MOUTH DAILY AS NEEDED FOR EDEMA (WEIGHT GAIN >3LBS IN 24 HRS, SOB).   glipiZIDE (GLUCOTROL) 10 MG tablet TAKE 2 TABLETS (20 MG TOTAL) BY MOUTH 2 (TWO) TIMES DAILY BEFORE A MEAL.   glucose blood (ONE TOUCH ULTRA TEST) test strip USE 1 STRIP 2 TIMES DAILY TO CHECK BLOOD SUGAR DX E08.22, N18.3   JANUVIA 50 MG tablet TAKE 1 TABLET BY MOUTH EVERY DAY   Krill Oil 1000 MG CAPS Take 1 capsule by mouth daily.   metoprolol succinate (TOPROL-XL) 25 MG 24 hr tablet TAKE 1 TABLET BY MOUTH EVERY DAY   mupirocin ointment (BACTROBAN) 2 % Apply topically daily.   triamcinolone cream (KENALOG) 0.1 % Apply 1 application topically 2 (two) times daily.   triamterene-hydrochlorothiazide (MAXZIDE) 75-50 MG tablet TAKE 1 TABLET BY MOUTH EVERY DAY   Vitamin D, Cholecalciferol, 25 MCG (1000 UT) CAPS Take 1 capsule by mouth daily.   Zinc 50 MG TABS Take 1 tablet by mouth daily.   No facility-administered encounter medications on file as of  12/27/2020.    Allergies (verified) Atorvastatin, Penicillins, Verapamil, and Influenza vaccines   History: Past Medical History:  Diagnosis Date   Arthritis    Bradycardia    Cataract    Chronic kidney disease stage III (GFR 30-59 ml/min) 05/03/2012   Colon polyps    Complication of anesthesia    emesis   Diabetes mellitus    GERD (gastroesophageal reflux disease)    HOH (hard of hearing)    HTN (hypertension) 01/13/2013   Hyperlipidemia    Hypertension    Pacemaker 12/2016   Persistent atrial fibrillation ([place])     Presence of permanent cardiac pacemaker 09/20/2016   Symptomatic bradycardia    Vitamin D deficiency 07/17/2015   Past Surgical History:  Procedure Laterality Date   ANKLE ARTHROPLASTY     CARDIOVERSION N/A 08/20/2016   Procedure: CARDIOVERSION;  Surgeon: Sanda Klein, MD;  Location: [facility];  Service: Cardiovascular;  Laterality: N/A;   CATARACT EXTRACTION  2010, 2014   CATARACT EXTRACTION  02/28/14   CHOLECYSTECTOMY     CHOLECYSTECTOMY N/A 05/03/2012   Procedure: LAPAROSCOPIC CHOLECYSTECTOMY WITH INTRAOPERATIVE CHOLANGIOGRAM;  Surgeon: [name], MD;  Location: [facility];  Service: General;  Laterality: N/A;   INSERT / REPLACE / REMOVE PACEMAKER  09/20/2016   PACEMAKER IMPLANT N/A 09/20/2016   Procedure: Pacemaker Implant;  Surgeon: Deboraha Sprang, MD;  Location: [facility];  Service: Cardiovascular;  Laterality: N/A;   ROTATOR CUFF REPAIR     SHOULDER SURGERY     TONSILLECTOMY     Family History  Problem Relation Age of Onset   Cancer Mother        colon, breast, pancreas, skin cancer   Heart disease Father        heart valve replaced   Stroke Father    Diabetes Father    Cancer Paternal Grandmother        colon   Obesity Son    Heart disease Son        bradycardia   Social History   Socioeconomic History   Marital status: Widowed    Spouse name: Not on file   Number of children: Not on file   Years of education: Not on file   Highest education level: Not on file  Occupational History   Not on file  Tobacco Use   Smoking status: Never   Smokeless tobacco: Never  Vaping Use   Vaping Use: Never used  Substance and Sexual Activity   Alcohol use: Yes    Alcohol/week: 1.0 standard drink    Types: 1 Glasses of wine per week   Drug use: No   Sexual activity: Not on file    Comment: lives alone , no major dietary restrictions, 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.  Other Topics Concern   Not on file  Social History Narrative   Not  on file   Social Determinants of Health   Financial Resource Strain: Medium Risk   Difficulty of Paying Living Expenses: Somewhat hard  Food Insecurity: Not on file  Transportation Needs: No Transportation Needs   Lack of Transportation (Medical): No   Lack of Transportation (Non-Medical): No  Physical Activity: Inactive   Days of Exercise per Week: 0 days   Minutes of Exercise per Session: 0 min  Stress: Not on file  Social Connections: Not on file    Tobacco Counseling Counseling given: Not Answered   Clinical Intake:  Diabetes:  Is the patient diabetic?  Yes  If diabetic, was a CBG obtained today?  No  Did the patient bring in their glucometer from home?  {YES/NO:21197} How often do you monitor your CBG's? ***.   Financial Strains and Diabetes Management:  Are you having any financial strains with the device, your supplies or your medication? {YES/NO:21197}.  Does the patient want to be seen by Chronic Care Management for management of their diabetes?  {YES/NO:21197} Would the patient like to be referred to a Nutritionist or for Diabetic Management?  {YES/NO:21197}  Diabetic Exams:  Diabetic Eye Exam: Completed 05/29/2020.   Diabetic Foot Exam: Completed 02/02/2020.          Activities of Daily Living In your present state of health, do you have any difficulty performing the following activities: 11/30/2020  Hearing? Y  Vision? N  Difficulty concentrating or making decisions? N  Walking or climbing stairs? Y  Dressing or bathing? N  Doing errands, shopping? N  Some recent data might be hidden    Patient Care Team: [name], MD as PCP - General (Family Medicine) Debara Pickett Nadean Corwin, MD as PCP - Cardiology (Cardiology) Clent Jacks, MD as Consulting Physician (Ophthalmology) Danella Sensing, MD as Consulting Physician (Dermatology) Dorothy Spark, MD (Inactive) as Consulting Physician (Cardiology) [name], MD as  Consulting Physician (Orthopedic Surgery) Regal, Tamala Fothergill, DPM as Consulting Physician (Podiatry) Cherre Robins, PharmD (Pharmacist)  Indicate any recent Medical Services you may have received from other than [facility] providers in the past year (date may be approximate).     Assessment:   This is a routine wellness examination for [place].  Hearing/Vision screen No results found.  Dietary issues and exercise activities discussed:     Goals Addressed   None    Depression Screen PHQ 2/9 Scores 11/30/2020 10/06/2019 09/14/2019 06/29/2019 12/18/2017 07/25/2016 01/23/2016  PHQ - 2 Score 0 0 0 0 0 0 0  PHQ- 9 Score - - 0 0 - - -    Fall Risk Fall Risk  07/13/2020 10/06/2019 09/14/2019 02/10/2019 12/18/2017  Falls in the past year? 0 0 0 0 No  Comment - - - Emmi Telephone Survey: data to providers prior to load -  Number falls in past yr: 0 0 - - -  Injury with Fall? 0 0 - - -  Comment - - - - -  Risk for fall due to : History of fall(s);Impaired balance/gait - - - -  Follow up Falls prevention discussed;Falls evaluation completed Education provided;Falls prevention discussed - - -    FALL RISK PREVENTION PERTAINING TO THE HOME:  Any stairs in or around the home? {YES/NO:21197} If so, are there any without handrails? {YES/NO:21197} Home free of loose throw rugs in walkways, pet beds, electrical cords, etc? {YES/NO:21197} Adequate lighting in your home to reduce risk of falls? {YES/NO:21197}  ASSISTIVE DEVICES UTILIZED TO PREVENT FALLS:  Life alert? {YES/NO:21197} Use of a cane, walker or w/c? {YES/NO:21197} Grab bars in the bathroom? {YES/NO:21197} Shower chair or bench in shower? {YES/NO:21197} Elevated toilet seat or a handicapped toilet? {YES/NO:21197}  TIMED UP AND GO:  Was the test performed? {YES/NO:21197}.  Length of time to ambulate 10 feet: *** sec.   {Appearance of LKGM:0102725}  Cognitive Function:        Immunizations Immunization History  Administered Date(s)  Administered   Pneumococcal Conjugate-13 03/23/2015   Pneumococcal Polysaccharide-23 06/27/2011   Tdap 05/19/2014    TDAP status: Up to date  {  Flu Vaccine status:[id number]}  Pneumococcal vaccine status: Up to date  Covid-19 vaccine status: Information provided on how to obtain vaccines.   Qualifies for Shingles Vaccine? Yes   Zostavax completed No   Shingrix Completed?: No.    Education has been provided regarding the importance of this vaccine. Patient has been advised to call insurance company to determine out of pocket expense if they have not yet received this vaccine. Advised may also receive vaccine at local pharmacy or Health Dept. Verbalized acceptance and understanding.  Screening Tests Health Maintenance  Topic Date Due   Zoster Vaccines- Shingrix (1 of 2) Never done   URINE MICROALBUMIN  07/15/2017   INFLUENZA VACCINE  10/16/2020   FOOT EXAM  02/01/2021   OPHTHALMOLOGY EXAM  05/29/2021   HEMOGLOBIN A1C  05/30/2021   TETANUS/TDAP  05/18/2024   HPV VACCINES  Aged Out   COVID-19 Vaccine  Discontinued    Health Maintenance  Health Maintenance Due  Topic Date Due   Zoster Vaccines- Shingrix (1 of 2) Never done   URINE MICROALBUMIN  07/15/2017   INFLUENZA VACCINE  10/16/2020    Colorectal cancer screening: No longer required.   Lung Cancer Screening: (Low Dose CT Chest recommended if Age 35-80 years, 30 pack-year currently smoking OR have quit w/in 15years.) does not qualify.    Additional Screening:  Hepatitis C Screening: does not qualify  Vision Screening: Recommended annual ophthalmology exams for early detection of glaucoma and other disorders of the eye. Is the patient up to date with their annual eye exam?  Yes  Who is the provider or what is the name of the office in which the patient attends annual eye exams? ***   Dental Screening: Recommended annual dental exams for proper oral hygiene  Community Resource Referral / Chronic Care Management: CRR  required this visit?  {YES/NO:21197}  CCM required this visit?  {YES/NO:21197}     Plan:     I have personally reviewed and noted the following in the patient's chart:   Medical and social history Use of alcohol, tobacco or illicit drugs  Current medications and supplements including opioid prescriptions. {Opioid Prescriptions:[phone]} Functional ability and status Nutritional status Physical activity Advanced directives List of other physicians Hospitalizations, surgeries, and ER visits in previous 12 months Vitals Screenings to include cognitive, depression, and falls Referrals and appointments  In addition, I have reviewed and discussed with patient certain preventive protocols, quality metrics, and best practice recommendations. A written personalized care plan for preventive services as well as general preventive health recommendations were provided to patient.   Due to this being a telephonic visit, the after visit summary with patients personalized plan was offered to patient via mail or my-chart. ***Patient declined at this time./ Patient would like to access on my-chart/ per request, patient was mailed a copy of AVS./ Patient preferred to pick up at office at next visit.   Marta Antu, LPN   27/08/2374  Nurse Health Advisor  Nurse Notes: ***

## 2020-12-27 NOTE — Telephone Encounter (Signed)
Patient was scheduled for a 2:20 appt (phone visit) for his Medicare Wellness exam. Attempted x 3 to reach patient. Left message for patient to call back to reschedule.

## 2020-12-28 LAB — CUP PACEART REMOTE DEVICE CHECK
Battery Remaining Longevity: 85 mo
Battery Voltage: 2.98 V
Brady Statistic RA Percent Paced: 2.61 %
Brady Statistic RV Percent Paced: 99.57 %
Date Time Interrogation Session: 20221012062443
Implantable Lead Implant Date: 20180706
Implantable Lead Implant Date: 20180706
Implantable Lead Location: 753859
Implantable Lead Location: 753860
Implantable Lead Model: 5076
Implantable Lead Model: 5076
Implantable Pulse Generator Implant Date: 20180706
Lead Channel Impedance Value: 285 Ohm
Lead Channel Impedance Value: 361 Ohm
Lead Channel Impedance Value: 380 Ohm
Lead Channel Impedance Value: 418 Ohm
Lead Channel Pacing Threshold Amplitude: 0.875 V
Lead Channel Pacing Threshold Pulse Width: 0.4 ms
Lead Channel Sensing Intrinsic Amplitude: 0.75 mV
Lead Channel Sensing Intrinsic Amplitude: 0.75 mV
Lead Channel Sensing Intrinsic Amplitude: 10.875 mV
Lead Channel Sensing Intrinsic Amplitude: 10.875 mV
Lead Channel Setting Pacing Amplitude: 2 V
Lead Channel Setting Pacing Amplitude: 2.5 V
Lead Channel Setting Pacing Pulse Width: 0.4 ms
Lead Channel Setting Sensing Sensitivity: 2.8 mV

## 2021-01-01 ENCOUNTER — Ambulatory Visit (INDEPENDENT_AMBULATORY_CARE_PROVIDER_SITE_OTHER): Payer: PPO

## 2021-01-01 VITALS — Ht 72.0 in | Wt 234.0 lb

## 2021-01-01 DIAGNOSIS — Z0000 Encounter for general adult medical examination without abnormal findings: Secondary | ICD-10-CM

## 2021-01-01 NOTE — Progress Notes (Signed)
Subjective:   Vincent Allen is a 87 y.o. male who presents for Medicare Annual/Subsequent preventive examination.  I connected with [name]. Auman today by telephone and verified that I am speaking with the correct person using two identifiers. Location patient: home Location provider: work Persons participating in the virtual visit: patient, Marine scientist.    I discussed the limitations, risks, security and privacy concerns of performing an evaluation and management service by telephone and the availability of in person appointments. I also discussed with the patient that there may be a patient responsible charge related to this service. The patient expressed understanding and verbally consented to this telephonic visit.    Interactive audio and video telecommunications were attempted between this provider and patient, however failed, due to patient having technical difficulties OR patient did not have access to video capability.  We continued and completed visit with audio only.  Some vital signs may be absent or patient reported.   Time Spent with patient on telephone encounter: 30 minutes   Review of Systems     Cardiac Risk Factors include: advanced age (>88men, >76 women);diabetes mellitus;hypertension;dyslipidemia     Objective:    Today's Vitals   01/01/21 1304  Weight: 234 lb (106.1 kg)  Height: 6' (1.829 m)   Body mass index is 31.74 kg/m.  Advanced Directives 01/01/2021 10/06/2019 11/12/2016 10/05/2016 09/20/2016 09/20/2016 08/20/2016  Does Patient Have a Medical Advance Directive? Yes Yes Yes No Yes Yes Yes  Type of Paramedic of [place];Living will [place];Living will [place];Living will - - Living will;Healthcare Power of [place];Living will  Does patient want to make changes to medical advance directive? Yes (MAU/Ambulatory/Procedural Areas - Information given) No - Patient  declined - - No - Patient declined - -  Copy of [place] in Chart? No - copy requested No - copy requested - - - - -  Would patient like information on creating a medical advance directive? - - - - - - -  Pre-existing out of facility DNR order (yellow form or pink MOST form) - - - - - - -    Current Medications (verified) Outpatient Encounter Medications as of 01/01/2021  Medication Sig   amLODipine (NORVASC) 5 MG tablet TAKE 1 TABLET BY MOUTH EVERY DAY   Calcium 600 MG tablet Take 1 tablet (600 mg total) by mouth 2 (two) times daily.   ELIQUIS 2.5 MG TABS tablet TAKE 1 TABLET BY MOUTH TWICE A DAY   furosemide (LASIX) 20 MG tablet TAKE 1-2 TABLETs BY MOUTH DAILY AS NEEDED FOR EDEMA (WEIGHT GAIN >3LBS IN 24 HRS, SOB).   glipiZIDE (GLUCOTROL) 10 MG tablet TAKE 2 TABLETS (20 MG TOTAL) BY MOUTH 2 (TWO) TIMES DAILY BEFORE A MEAL.   glucose blood (ONE TOUCH ULTRA TEST) test strip USE 1 STRIP 2 TIMES DAILY TO CHECK BLOOD SUGAR DX E08.22, N18.3   JANUVIA 50 MG tablet TAKE 1 TABLET BY MOUTH EVERY DAY   Krill Oil 1000 MG CAPS Take 1 capsule by mouth daily.   metoprolol succinate (TOPROL-XL) 25 MG 24 hr tablet TAKE 1 TABLET BY MOUTH EVERY DAY   mupirocin ointment (BACTROBAN) 2 % Apply topically daily.   triamcinolone cream (KENALOG) 0.1 % Apply 1 application topically 2 (two) times daily.   triamterene-hydrochlorothiazide (MAXZIDE) 75-50 MG tablet TAKE 1 TABLET BY MOUTH EVERY DAY   Vitamin D, Cholecalciferol, 25 MCG (1000 UT) CAPS Take 1 capsule  by mouth daily.   Zinc 50 MG TABS Take 1 tablet by mouth daily.   No facility-administered encounter medications on file as of 01/01/2021.    Allergies (verified) Atorvastatin, Penicillins, Verapamil, and Influenza vaccines   History: Past Medical History:  Diagnosis Date   Arthritis    Bradycardia    Cataract    Chronic kidney disease stage III (GFR 30-59 ml/min) 05/03/2012   Colon polyps    Complication of anesthesia    emesis    Diabetes mellitus    GERD (gastroesophageal reflux disease)    HOH (hard of hearing)    HTN (hypertension) 01/13/2013   Hyperlipidemia    Hypertension    Pacemaker 12/2016   Persistent atrial fibrillation ([place])    Presence of permanent cardiac pacemaker 09/20/2016   Symptomatic bradycardia    Vitamin D deficiency 07/17/2015   Past Surgical History:  Procedure Laterality Date   ANKLE ARTHROPLASTY     CARDIOVERSION N/A 08/20/2016   Procedure: CARDIOVERSION;  Surgeon: Sanda Klein, MD;  Location: [facility];  Service: Cardiovascular;  Laterality: N/A;   CATARACT EXTRACTION  2010, 2014   CATARACT EXTRACTION  02/28/14   CHOLECYSTECTOMY     CHOLECYSTECTOMY N/A 05/03/2012   Procedure: LAPAROSCOPIC CHOLECYSTECTOMY WITH INTRAOPERATIVE CHOLANGIOGRAM;  Surgeon: [name], MD;  Location: [facility];  Service: General;  Laterality: N/A;   INSERT / REPLACE / REMOVE PACEMAKER  09/20/2016   PACEMAKER IMPLANT N/A 09/20/2016   Procedure: Pacemaker Implant;  Surgeon: Deboraha Sprang, MD;  Location: [facility];  Service: Cardiovascular;  Laterality: N/A;   ROTATOR CUFF REPAIR     SHOULDER SURGERY     TONSILLECTOMY     Family History  Problem Relation Age of Onset   Cancer Mother        colon, breast, pancreas, skin cancer   Heart disease Father        heart valve replaced   Stroke Father    Diabetes Father    Cancer Paternal Grandmother        colon   Obesity Son    Heart disease Son        bradycardia   Social History   Socioeconomic History   Marital status: Widowed    Spouse name: Not on file   Number of children: Not on file   Years of education: Not on file   Highest education level: Not on file  Occupational History   Not on file  Tobacco Use   Smoking status: Never   Smokeless tobacco: Never  Vaping Use   Vaping Use: Never used  Substance and Sexual Activity   Alcohol use: Yes    Alcohol/week: 1.0 standard drink    Types: 1 Glasses of wine per week   Drug use:  No   Sexual activity: Not on file    Comment: lives alone , no major dietary restrictions, 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.  Other Topics Concern   Not on file  Social History Narrative   Not on file   Social Determinants of Health   Financial Resource Strain: Low Risk    Difficulty of Paying Living Expenses: Not hard at all  Food Insecurity: No Food Insecurity   Worried About Charity fundraiser in the Last Year: Never true   [place] in the Last Year: Never true  Transportation Needs: No Transportation Needs   Lack of Transportation (Medical): No   Lack of Transportation (  Non-Medical): No  Physical Activity: Insufficiently Active   Days of Exercise per Week: 7 days   Minutes of Exercise per Session: 10 min  Stress: No Stress Concern Present   Feeling of Stress : Not at all  Social Connections: Socially Isolated   Frequency of Communication with Friends and Family: More than three times a week   Frequency of Social Gatherings with Friends and Family: Three times a week   Attends Religious Services: Never   Active Member of Clubs or Organizations: No   Attends Archivist Meetings: Never   Marital Status: Widowed    Tobacco Counseling Counseling given: Not Answered   Clinical Intake:  Pre-visit preparation completed: Yes  Pain : No/denies pain     BMI - recorded: 31.74 Nutritional Status: BMI > 30  Obese Nutritional Risks: Unintentional weight gain Diabetes: Yes CBG done?: No (phone visit) Did pt. bring in CBG monitor from home?: No  How often do you need to have someone help you when you read instructions, pamphlets, or other written materials from your doctor or pharmacy?: 1 - Never  Diabetes:  Is the patient diabetic?  Yes  If diabetic, was a CBG obtained today?  No , visit completed over the phone.  Did the patient bring in their glucometer from home?  No , visit completed over the phone.  How often do you  monitor your CBG's? daily.   Financial Strains and Diabetes Management:  Are you having any financial strains with the device, your supplies or your medication? No .  Does the patient want to be seen by Chronic Care Management for management of their diabetes?  No  Would the patient like to be referred to a Nutritionist or for Diabetic Management?  No   Diabetic Exams:  Diabetic Eye Exam: Completed 05/29/20. Pt has been advised about the importance in completing this exam.  Diabetic Foot Exam: Completed 02/02/20. Pt has been advised about the importance in completing this exam.  Interpreter Needed?: No  Information entered by :: Orrin Brigham LPN   Activities of Daily Living In your present state of health, do you have any difficulty performing the following activities: 01/01/2021 11/30/2020  Hearing? Tempie Donning  Vision? N N  Difficulty concentrating or making decisions? N N  Walking or climbing stairs? Y Y  Comment uses cane -  Dressing or bathing? Y N  Comment putting on shoes -  Doing errands, shopping? Y N  Comment has assistance with grocery shopping -  Preparing Food and eating ? N -  Using the Toilet? N -  In the past six months, have you accidently leaked urine? N -  Do you have problems with loss of bowel control? N -  Managing your Medications? N -  Managing your Finances? N -  Housekeeping or managing your Housekeeping? N -  Some recent data might be hidden    Patient Care Team: [name], MD as PCP - General (Family Medicine) Debara Pickett Nadean Corwin, MD as PCP - Cardiology (Cardiology) Clent Jacks, MD as Consulting Physician (Ophthalmology) Danella Sensing, MD as Consulting Physician (Dermatology) Dorothy Spark, MD (Inactive) as Consulting Physician (Cardiology) [name], MD as Consulting Physician (Orthopedic Surgery) Regal, Tamala Fothergill, DPM as Consulting Physician (Podiatry) Cherre Robins, PharmD (Pharmacist)  Indicate any recent Medical Services you  may have received from other than [facility] providers in the past year (date may be approximate).     Assessment:   This is a routine wellness  examination for Vincent Allen.  Hearing/Vision screen Hearing Screening - Comments:: Yes, patient states having a hard time hearing. Last exam was a couple of years ago. Vision Screening - Comments:: Last eye exam 2022, Dr Schuyler Amor.   Dietary issues and exercise activities discussed: Current Exercise Habits: The patient does not participate in regular exercise at present, Exercise limited by: Other - see comments (Decrease in stamina)   Goals Addressed             This Visit's Progress    Patient Stated       Patient would like to move more.        Depression Screen PHQ 2/9 Scores 01/01/2021 11/30/2020 10/06/2019 09/14/2019 06/29/2019 12/18/2017 07/25/2016  PHQ - 2 Score 0 0 0 0 0 0 0  PHQ- 9 Score - - - 0 0 - -    Fall Risk Fall Risk  01/01/2021 07/13/2020 10/06/2019 09/14/2019 02/10/2019  Falls in the past year? 0 0 0 0 0  Comment - - - - Emmi Telephone Survey: data to providers prior to load  Number falls in past yr: 0 0 0 - -  Injury with Fall? 0 0 0 - -  Comment - - - - -  Risk for fall due to : No Fall Risks History of fall(s);Impaired balance/gait - - -  Follow up Falls prevention discussed Falls prevention discussed;Falls evaluation completed Education provided;Falls prevention discussed - -    FALL RISK PREVENTION PERTAINING TO THE HOME:  Any stairs in or around the home? No  Home free of loose throw rugs in walkways, pet beds, electrical cords, etc? Yes  Adequate lighting in your home to reduce risk of falls? Yes   ASSISTIVE DEVICES UTILIZED TO PREVENT FALLS:  Life alert? No  Use of a cane, walker or w/c? Yes  , cane Grab bars in the bathroom? Yes  Shower chair or bench in shower? No  Elevated toilet seat or a handicapped toilet? Yes   TIMED UP AND GO:  Was the test performed? No . , Visit was completed over the  phone.   Cognitive Function: Normal cognitive status assessed  by this Nurse Health Advisor. No abnormalities found.        Immunizations Immunization History  Administered Date(s) Administered   Pneumococcal Conjugate-13 03/23/2015   Pneumococcal Polysaccharide-23 06/27/2011   Tdap 05/19/2014    TDAP status: Up to date  Flu Vaccine status: Declined, Education has been provided regarding the importance of this vaccine but patient still declined. Advised may receive this vaccine at local pharmacy or Health Dept. Aware to provide a copy of the vaccination record if obtained from local pharmacy or Health Dept. Verbalized acceptance and understanding.  Pneumococcal vaccine status: Up to date  Covid-19 vaccine status: Declined, Education has been provided regarding the importance of this vaccine but patient still declined. Advised may receive this vaccine at local pharmacy or Health Dept.or vaccine clinic. Aware to provide a copy of the vaccination record if obtained from local pharmacy or Health Dept. Verbalized acceptance and understanding.  Qualifies for Shingles Vaccine? Yes , Patient declined at this time.  Zostavax completed No  Shingrix Completed?: No.    Education has been provided regarding the importance of this vaccine. Patient has been advised to call insurance company to determine out of pocket expense if they have not yet received this vaccine. Advised may also receive vaccine at local pharmacy or Health Dept. Verbalized acceptance and understanding.  Screening Tests Health Maintenance  Topic  Date Due   Zoster Vaccines- Shingrix (1 of 2) Never done   URINE MICROALBUMIN  07/15/2017   INFLUENZA VACCINE  10/16/2020   FOOT EXAM  02/01/2021   OPHTHALMOLOGY EXAM  05/29/2021   HEMOGLOBIN A1C  05/30/2021   TETANUS/TDAP  05/18/2024   HPV VACCINES  Aged Out   COVID-19 Vaccine  Discontinued    Health Maintenance  Health Maintenance Due  Topic Date Due   Zoster Vaccines-  Shingrix (1 of 2) Never done   URINE MICROALBUMIN  07/15/2017   INFLUENZA VACCINE  10/16/2020    Colorectal cancer screening: No longer required.   Lung Cancer Screening: (Low Dose CT Chest recommended if Age 62-80 years, 30 pack-year currently smoking OR have quit w/in 15years.) does not qualify.     Additional Screening:  Hepatitis C Screening: does not qualify;   Vision Screening: Recommended annual ophthalmology exams for early detection of glaucoma and other disorders of the eye. Is the patient up to date with their annual eye exam?  Yes  Who is the provider or what is the name of the office in which the patient attends annual eye exams? Dr. Schuyler Amor  Dental Screening: Recommended annual dental exams for proper oral hygiene  Community Resource Referral / Chronic Care Management: CRR required this visit?  No   CCM required this visit?  No      Plan:     I have personally reviewed and noted the following in the patient's chart:   Medical and social history Use of alcohol, tobacco or illicit drugs  Current medications and supplements including opioid prescriptions. Patient is not currently taking opioid prescriptions. Functional ability and status Nutritional status Physical activity Advanced directives List of other physicians Hospitalizations, surgeries, and ER visits in previous 12 months Vitals Screenings to include cognitive, depression, and falls Referrals and appointments  In addition, I have reviewed and discussed with patient certain preventive protocols, quality metrics, and best practice recommendations. A written personalized care plan for preventive services as well as general preventive health recommendations were provided to patient.  Due to this being a telephonic visit, the after visit summary with patients personalized plan was offered to patient via mail or my-chart. Patient would like to access on my-chart.    Loma Messing, LPN   [id number]    Nurse Health Advisor  Nurse Notes: None

## 2021-01-01 NOTE — Patient Instructions (Signed)
[name] , Thank you for taking time to complete your Medicare Wellness Visit. I appreciate your ongoing commitment to your health goals. Please review the following plan we discussed and let me know if I can assist you in the future.   Screening recommendations/referrals: Colonoscopy: no longer required Recommended yearly ophthalmology/optometry visit for glaucoma screening and checkup Recommended yearly dental visit for hygiene and checkup  Vaccinations: Influenza vaccine: due, declined today, if you would like to schedule contact PCP or local pharmacy. Pneumococcal vaccine: up to date Tdap vaccine: up to date, completed 05/19/14, Due 05/18/24 Shingles vaccine: declined today, if you would like to schedule contact your local pharmacy.    Covid-19: Newest booster available at local pharmacy  Advanced directives: will bring a copy to next visit.  Conditions/risks identified: see problem list  Next appointment: Follow up in one year for your annual wellness visit.   Preventive Care 85 Years and Older, Male Preventive care refers to lifestyle choices and visits with your health care provider that can promote health and wellness. What does preventive care include? A yearly physical exam. This is also called an annual well check. Dental exams once or twice a year. Routine eye exams. Ask your health care provider how often you should have your eyes checked. Personal lifestyle choices, including: Daily care of your teeth and gums. Regular physical activity. Eating a healthy diet. Avoiding tobacco and drug use. Limiting alcohol use. Practicing safe sex. Taking low doses of aspirin every day. Taking vitamin and mineral supplements as recommended by your health care provider. What happens during an annual well check? The services and screenings done by your health care provider during your annual well check will depend on your age, overall health, lifestyle risk factors, and family  history of disease. Counseling  Your health care provider may ask you questions about your: Alcohol use. Tobacco use. Drug use. Emotional well-being. Home and relationship well-being. Sexual activity. Eating habits. History of falls. Memory and ability to understand (cognition). Work and work Statistician. Screening  You may have the following tests or measurements: Height, weight, and BMI. Blood pressure. Lipid and cholesterol levels. These may be checked every 5 years, or more frequently if you are over 73 years old. Skin check. Lung cancer screening. You may have this screening every year starting at age 38 if you have a 30-pack-year history of smoking and currently smoke or have quit within the past 15 years. Fecal occult blood test (FOBT) of the stool. You may have this test every year starting at age 5. Flexible sigmoidoscopy or colonoscopy. You may have a sigmoidoscopy every 5 years or a colonoscopy every 10 years starting at age 30. Prostate cancer screening. Recommendations will vary depending on your family history and other risks. Hepatitis C blood test. Hepatitis B blood test. Sexually transmitted disease (STD) testing. Diabetes screening. This is done by checking your blood sugar (glucose) after you have not eaten for a while (fasting). You may have this done every 1-3 years. Abdominal aortic aneurysm (AAA) screening. You may need this if you are a current or former smoker. Osteoporosis. You may be screened starting at age 55 if you are at high risk. Talk with your health care provider about your test results, treatment options, and if necessary, the need for more tests. Vaccines  Your health care provider may recommend certain vaccines, such as: Influenza vaccine. This is recommended every year. Tetanus, diphtheria, and acellular pertussis (Tdap, Td) vaccine. You may need a Td booster  every 10 years. Zoster vaccine. You may need this after age 66. Pneumococcal  13-valent conjugate (PCV13) vaccine. One dose is recommended after age 71. Pneumococcal polysaccharide (PPSV23) vaccine. One dose is recommended after age 7. Talk to your health care provider about which screenings and vaccines you need and how often you need them. This information is not intended to replace advice given to you by your health care provider. Make sure you discuss any questions you have with your health care provider. Document Released: 03/31/2015 Document Revised: 11/22/2015 Document Reviewed: 01/03/2015 Elsevier Interactive Patient Education  2017 [employer] [employer] Prevention in the Home Falls can cause injuries. They can happen to people of all ages. There are many things you can do to make your home safe and to help prevent falls. What can I do on the outside of my home? Regularly fix the edges of walkways and driveways and fix any cracks. Remove anything that might make you trip as you walk through a door, such as a raised step or threshold. Trim any bushes or trees on the path to your home. Use bright outdoor lighting. Clear any walking paths of anything that might make someone trip, such as rocks or tools. Regularly check to see if handrails are loose or broken. Make sure that both sides of any steps have handrails. Any raised decks and porches should have guardrails on the edges. Have any leaves, snow, or ice cleared regularly. Use sand or salt on walking paths during winter. Clean up any spills in your garage right away. This includes oil or grease spills. What can I do in the bathroom? Use night lights. Install grab bars by the toilet and in the tub and shower. Do not use towel bars as grab bars. Use non-skid mats or decals in the tub or shower. If you need to sit down in the shower, use a plastic, non-slip stool. Keep the floor dry. Clean up any water that spills on the floor as soon as it happens. Remove soap buildup in the tub or shower regularly. Attach  bath mats securely with double-sided non-slip rug tape. Do not have throw rugs and other things on the floor that can make you trip. What can I do in the bedroom? Use night lights. Make sure that you have a light by your bed that is easy to reach. Do not use any sheets or blankets that are too big for your bed. They should not hang down onto the floor. Have a firm chair that has side arms. You can use this for support while you get dressed. Do not have throw rugs and other things on the floor that can make you trip. What can I do in the kitchen? Clean up any spills right away. Avoid walking on wet floors. Keep items that you use a lot in easy-to-reach places. If you need to reach something above you, use a strong step stool that has a grab bar. Keep electrical cords out of the way. Do not use floor polish or wax that makes floors slippery. If you must use wax, use non-skid floor wax. Do not have throw rugs and other things on the floor that can make you trip. What can I do with my stairs? Do not leave any items on the stairs. Make sure that there are handrails on both sides of the stairs and use them. Fix handrails that are broken or loose. Make sure that handrails are as long as the stairways. Check any carpeting to  make sure that it is firmly attached to the stairs. Fix any carpet that is loose or worn. Avoid having throw rugs at the top or bottom of the stairs. If you do have throw rugs, attach them to the floor with carpet tape. Make sure that you have a light switch at the top of the stairs and the bottom of the stairs. If you do not have them, ask someone to add them for you. What else can I do to help prevent falls? Wear shoes that: Do not have high heels. Have rubber bottoms. Are comfortable and fit you well. Are closed at the toe. Do not wear sandals. If you use a stepladder: Make sure that it is fully opened. Do not climb a closed stepladder. Make sure that both sides of the  stepladder are locked into place. Ask someone to hold it for you, if possible. Clearly mark and make sure that you can see: Any grab bars or handrails. First and last steps. Where the edge of each step is. Use tools that help you move around (mobility aids) if they are needed. These include: Canes. Walkers. Scooters. Crutches. Turn on the lights when you go into a dark area. Replace any light bulbs as soon as they burn out. Set up your furniture so you have a clear path. Avoid moving your furniture around. If any of your floors are uneven, fix them. If there are any pets around you, be aware of where they are. Review your medicines with your doctor. Some medicines can make you feel dizzy. This can increase your chance of falling. Ask your doctor what other things that you can do to help prevent falls. This information is not intended to replace advice given to you by your health care provider. Make sure you discuss any questions you have with your health care provider. Document Released: 12/29/2008 Document Revised: 08/10/2015 Document Reviewed: 04/08/2014 Elsevier Interactive Patient Education  2017 [employer].

## 2021-01-05 NOTE — Progress Notes (Signed)
Remote pacemaker transmission.   

## 2021-01-08 DIAGNOSIS — R3914 Feeling of incomplete bladder emptying: Secondary | ICD-10-CM | POA: Diagnosis not present

## 2021-01-08 DIAGNOSIS — N401 Enlarged prostate with lower urinary tract symptoms: Secondary | ICD-10-CM | POA: Diagnosis not present

## 2021-01-08 DIAGNOSIS — R3915 Urgency of urination: Secondary | ICD-10-CM | POA: Diagnosis not present

## 2021-01-12 ENCOUNTER — Ambulatory Visit (INDEPENDENT_AMBULATORY_CARE_PROVIDER_SITE_OTHER): Payer: PPO | Admitting: Pharmacist

## 2021-01-12 DIAGNOSIS — E785 Hyperlipidemia, unspecified: Secondary | ICD-10-CM

## 2021-01-12 DIAGNOSIS — E0822 Diabetes mellitus due to underlying condition with diabetic chronic kidney disease: Secondary | ICD-10-CM

## 2021-01-12 DIAGNOSIS — N183 Chronic kidney disease, stage 3 unspecified: Secondary | ICD-10-CM

## 2021-01-12 DIAGNOSIS — I119 Hypertensive heart disease without heart failure: Secondary | ICD-10-CM

## 2021-01-12 DIAGNOSIS — R002 Palpitations: Secondary | ICD-10-CM

## 2021-01-15 DIAGNOSIS — E0822 Diabetes mellitus due to underlying condition with diabetic chronic kidney disease: Secondary | ICD-10-CM | POA: Diagnosis not present

## 2021-01-15 DIAGNOSIS — E785 Hyperlipidemia, unspecified: Secondary | ICD-10-CM | POA: Diagnosis not present

## 2021-01-15 DIAGNOSIS — N183 Chronic kidney disease, stage 3 unspecified: Secondary | ICD-10-CM

## 2021-01-15 DIAGNOSIS — I119 Hypertensive heart disease without heart failure: Secondary | ICD-10-CM | POA: Diagnosis not present

## 2021-01-15 NOTE — Chronic Care Management (AMB) (Signed)
Chronic Care Management Pharmacy Note  01/15/2021 Name:  Vincent Allen MRN:  [id number] DOB:  1933/11/03  Subjective: Vincent Allen is an 87 y.o. year old male who is a primary patient of [name], MD.  The CCM team was consulted for assistance with disease management and care coordination needs.    Engaged with patient by telephone for follow up visit in response to provider referral for pharmacy case management and/or care coordination services.   Consent to Services:  The patient was given information about Chronic Care Management services, agreed to services, and gave verbal consent prior to initiation of services.  Please see initial visit note for detailed documentation.   Patient Care Team: [name], MD as PCP - General (Family Medicine) Debara Pickett Nadean Corwin, MD as PCP - Cardiology (Cardiology) Clent Jacks, MD as Consulting Physician (Ophthalmology) Danella Sensing, MD as Consulting Physician (Dermatology) Dorothy Spark, MD (Inactive) as Consulting Physician (Cardiology) [name], MD as Consulting Physician (Orthopedic Surgery) Regal, Tamala Fothergill, DPM as Consulting Physician (Podiatry) Cherre Robins, PharmD (Pharmacist)   Hospital visits: None in previous 6 months  Objective:  Lab Results  Component Value Date   CREATININE 1.71 (H) 11/30/2020   CREATININE 1.69 (H) 09/15/2020   CREATININE 1.67 (H) 08/10/2020    Lab Results  Component Value Date   HGBA1C 6.3 11/30/2020   Last diabetic Eye exam:  Lab Results  Component Value Date/Time   HMDIABEYEEXA No Retinopathy 05/26/2019 12:00 AM    Last diabetic Foot exam: No results found for: HMDIABFOOTEX      Component Value Date/Time   CHOL 141 11/30/2020 1600   TRIG 90.0 11/30/2020 1600   HDL 36.70 (L) 11/30/2020 1600   CHOLHDL 4 11/30/2020 1600   VLDL 18.0 11/30/2020 1600   LDLCALC 86 11/30/2020 1600   LDLCALC 101 (H) 12/16/2019 1543   LDLDIRECT 65.0 11/03/2018 1038     Hepatic Function Latest Ref Rng & Units 11/30/2020 09/15/2020 08/10/2020  Total Protein 6.0 - 8.3 g/dL 6.9 6.7 6.8  Albumin 3.5 - 5.2 g/dL 3.7 3.7 3.8  AST 0 - 37 U/L 23 27 32  ALT 0 - 53 U/L _0 Alk Phosphatase 39 - 117 U/L 125(H) 129(H) 172(H)  Total Bilirubin 0.2 - 1.2 mg/dL 2.0(H) 1.7(H) 2.1(H)  Bilirubin, Direct 0.0 - 0.3 mg/dL - - -    Lab Results  Component Value Date/Time   TSH 4.55 11/30/2020 04:00 PM   TSH 4.67 (H) 08/10/2020 03:13 PM   FREET4 1.01 08/11/2020 04:25 PM    CBC Latest Ref Rng & Units 11/30/2020 08/10/2020 04/20/2020  WBC 4.0 - 10.5 K/uL 6.7 8.2 7.6  Hemoglobin 13.0 - 17.0 g/dL 13.8 13.5 13.3  Hematocrit 39.0 - 52.0 % 43.0 41.6 42.2  Platelets 150.0 - 400.0 K/uL 142.0(L) 158.0 175.0    Lab Results  Component Value Date/Time   VD25OH 76.09 08/10/2020 03:13 PM   VD25OH 74.75 04/20/2020 11:06 AM    Clinical ASCVD: Yes  The ASCVD Risk score (Arnett DK, et al., 2019) failed to calculate for the following reasons:   The 2019 ASCVD risk score is only valid for ages 43 to 35     Social History   Tobacco Use  Smoking Status Never  Smokeless Tobacco Never   BP Readings from Last 3 Encounters:  11/30/20 130/75  10/20/20 126/76  09/07/20 133/80   Pulse Readings from Last 3 Encounters:  11/30/20 79  10/20/20 66  09/07/20  10   Wt Readings from Last 3 Encounters:  01/01/21 234 lb (106.1 kg)  11/30/20 228 lb 3.2 oz (103.5 kg)  10/20/20 220 lb 9.6 oz (100.1 kg)    Assessment: Review of patient past medical history, allergies, medications, health status, including review of consultants reports, laboratory and other test data, was performed as part of comprehensive evaluation and provision of chronic care management services.   SDOH:  (Social Determinants of Health) assessments and interventions performed:  SDOH Interventions    Flowsheet Row Most Recent Value  SDOH Interventions   Financial Strain Interventions Other (Comment)  [Getting Januvia  from PAP,  Wil assist with Eliquis PAP if eligible]       CCM Care Plan  Allergies  Allergen Reactions   Atorvastatin     Myalgias / leg pain and weakness   Penicillins Hives and Itching    Has patient had a PCN reaction causing immediate rash, facial/tongue/throat swelling, SOB or lightheadedness with hypotension:Yes Has patient had a PCN reaction causing severe rash involving mucus membranes or skin necrosis:No Has patient had a PCN reaction that required hospitalization:No Has patient had a PCN reaction occurring within the last 10 years:No If all of the above answers are "NO", then may proceed with Cephalosporin use.    Verapamil     Junctional rhythm   Influenza Vaccines Rash    tinnitus    Medications Reviewed Today     Reviewed by Cherre Robins, PharmD (Pharmacist) on 01/12/21 at [facility] List Status: <None>   Medication Order Taking? Sig Documenting Provider Last Dose Status Informant  amLODipine (NORVASC) 5 MG tablet [id number] Yes TAKE 1 TABLET BY MOUTH EVERY DAY [name], MD Taking Active   Calcium 600 MG tablet [id number] Yes Take 1 tablet (600 mg total) by mouth 2 (two) times daily. Burnice Logan, MD Taking Active Self  ELIQUIS 2.5 MG TABS tablet [id number] Yes TAKE 1 TABLET BY MOUTH TWICE A DAY [name], MD Taking Active   furosemide (LASIX) 20 MG tablet [id number] Yes TAKE 1-2 TABLETs BY MOUTH DAILY AS NEEDED FOR EDEMA (WEIGHT GAIN >3LBS IN 24 HRS, SOB). [name], MD Taking Active   glipiZIDE (GLUCOTROL) 10 MG tablet [id number] Yes TAKE 2 TABLETS (20 MG TOTAL) BY MOUTH 2 (TWO) TIMES DAILY BEFORE A MEAL. [name], MD Taking Active   glucose blood (ONE TOUCH ULTRA TEST) test strip [id number] Yes USE 1 STRIP 2 TIMES DAILY TO CHECK BLOOD SUGAR DX E08.22, N18.3 [name], MD Taking Active   JANUVIA 50 MG tablet [id number] Yes TAKE 1 TABLET BY MOUTH EVERY DAY [name], MD Taking Active   Krill Oil 1000 MG CAPS [id number] Yes Take 1  capsule by mouth daily. [provider] Taking Active   metoprolol succinate (TOPROL-XL) 25 MG 24 hr tablet [id number] Yes TAKE 1 TABLET BY MOUTH EVERY DAY [name], MD Taking Active   mupirocin ointment (BACTROBAN) 2 % [id number] Yes Apply topically daily. [provider] Taking Active   triamcinolone cream (KENALOG) 0.1 % [id number] Yes Apply 1 application topically 2 (two) times daily. [name], MD Taking Active   triamterene-hydrochlorothiazide [facility]) 75-50 MG tablet [id number] Yes TAKE 1 TABLET BY MOUTH EVERY DAY [name], MD Taking Active   Vitamin D, Cholecalciferol, 25 MCG (1000 UT) CAPS [id number] Yes Take 1 capsule by mouth daily. [provider] Taking Active   Zinc 50 MG TABS [id number] Yes Take 1 tablet  by mouth daily. [provider] Taking Active             Patient Active Problem List   Diagnosis Date Noted   Fatty liver 12/01/2020   Pedal edema 08/14/2020   Knee pain, bilateral 09/20/2019   Morbid obesity ([place]) 09/14/2019   Urinary frequency 11/06/2016   S/P placement of cardiac pacemaker 10/24/2016   Chronic diastolic CHF (congestive heart failure) ([place]) 10/24/2016   Cardiac device in situ    Debility 08/15/2016   Persistent atrial fibrillation ([place]) 07/09/2016   Vitamin D deficiency 07/17/2015   Preventative health care 07/17/2015   Lumbago 12/02/2014   Pain of toe of left foot 03/14/2014   Medicare annual wellness visit, subsequent 01/17/2013   Essential hypertension 01/13/2013   Chronic kidney disease stage III (GFR 30-59 ml/min) 05/03/2012   Insomnia 06/27/2011   Osteopenia 09/02/2010   Family history of colon cancer 09/02/2010   Diabetes mellitus with chronic kidney disease ([place])    Hypertensive heart disease    Hyperlipidemia LDL goal <70    Carotid artery disease (H[facility])    GERD (gastroesophageal reflux disease)    CTS (carpal tunnel syndrome) 08/28/2010    Immunization History  Administered  Date(s) Administered   Pneumococcal Conjugate-13 03/23/2015   Pneumococcal Polysaccharide-23 06/27/2011   Tdap 05/19/2014    Conditions to be addressed/monitored: Atrial Fibrillation, CHF, CAD, HTN, HLD, DMII and osteopenia; CKD - stage 3b  Care Plan : General Pharmacy (Adult)  Updates made by Cherre Robins, PHARMD since 01/15/2021 12:00 AM     Problem: Provide education, support and care coordination for medication therapy and chronic conditions   Priority: High  Onset Date: 07/13/2020  Note:   CARE PLAN ENTRY (see longitudinal plan of care for additional care plan information)  Current Barriers:  Chronic Disease Management support, education, and care coordination needs related to Hypertension, Hyperlipidemia, Diabetes, atrial fibrillation, Heart Failure, CKD - 3   Hypertension / High Blood pressure BP Readings from Last 3 Encounters:  04/20/20 138/88  03/24/20 129/72  12/16/19 129/72   BP goal <140/90 - Controlled Current regimen:  Amlodipine 5mg  daily in morning Triamterene-hydrochlorothiazide  75-50mg  daily in morning Patient self care activities - Over the next 90 days, patient will: Maintain hypertension medication regimen.   Hyperlipidemia Lab Results  Component Value Date/Time   LDLCALC 95 04/20/2020 11:06 AM   LDLCALC 101 (H) 12/16/2019 03:43 PM   LDLDIRECT 65.0 11/03/2018 10:38 AM   LDL goal < 70 - not currently at goal Tried atorvastatin in past - caused leg pain and weakness Current regimen:  Diet and exercise management   Krill Oil 1000mg  daily Interventions: Consider retrial of alternative statin like  rosuvastatin 5mg  daily  Recommend limiting intake of food high in saturated fat / avoid fried foods; increase intake of vegetables Continue Krill Oil  Diabetes Lab Results  Component Value Date/Time   HGBA1C 7.0 (H) 04/20/2020 11:06 AM   HGBA1C 7.2 (H) 12/16/2019 03:43 PM  Current regimen:  Januvia 50mg  1 tablet daily in morning Glipizide 10mg  -  take 2 tablets with morning meal and 2 tablets with evening meal Glucocil - take 2 tablets twice a day Checks BG daily  Recent home BG readings: 95, 87, 156, 116, 137, 157, 147, 87, 125, 116, 117, 131, 133, 125 Denies hypoglycemic events or symptoms. Patient completed patient assistance program for Januvia and has been receiving since around June 2022.  Interventions: Recommended checking blood glucose / sugar daily Consider less aggressive A1c  goal of <7.5% given patient's age and CVD history.  Reviewed home blood glucose readings and reviewed goals  Fasting blood glucose goal (before meals) = 80 to 130 Blood glucose goal after a meal = less than 180    Chronic Heart Failure:  Current Regimen:  Furosemide 67m daily if needed for weight gain of 3lbs on 24 hours or for shortness of breath Metoprolol succinate ER 246mdaily Patient has scales at home but only checks weight 2 or 3 times per week Interventions:  Discussed importance of checking weight daily  Reviewed when to take as needed furosemide   Atrial fibrillation  Current regimen:  Eliquis 2.21m87mwice daily in morning and with dinner Metoprolol succinate 221m44mily in morning Interventions: Recommended patient check in with his pharmacy to see if he has met 3% OOP spend for Eliquis patient assistance application (not sure that he will meet for 2022.  Left #4 weeks of Eliquis 2.21mg 58mples in front office for patient to pick up Maintain current atrial fibrillation medication regimen   Decreased Kidney Function:  BMET Lab Results  Component Value Date   NA 136 11/30/2020   K 4.3 11/30/2020   CO2 23 11/30/2020   GLUCOSE 139 (H) 11/30/2020   BUN 43 (H) 11/30/2020   CREATININE 1.71 (H) 11/30/2020   CALCIUM 9.5 11/30/2020   GFRNONAA 42 (L) 09/13/2016   Current regimen:  none Interventions: Discussed that Dr BlythCharlett Blakesuggested referral to nephrologist after last labs. Patient is agreeable (completed)  Recommend  adjust dose of Januvia to 50mg 76my or consider SGLT2 / GLP1 due to recent renal function change. (Completed)  Avoid over the counter pain relievers like Aleve / naproxen; Motrin / ibuprofen. If need for pain relief, headaches or fever - Tylenol / acetaminophen 500mg u14m 2 tablets every 8 hours is safer option.   Osteopenia:  Last DEXA 12/2019 Lowest T-Score = -2.4 at left femur neck ;  Right femur neck = -1.4 History of fracture foot and shoulder. Per patient sustained from fall from standing position  Fall Risk  01/01/2021 07/13/2020 10/06/2019 09/14/2019 02/10/2019  Falls in the past year? 0 0 0 0 0  Comment - - - - Emmi Telephone Survey: data to providers prior to load  Number falls in past yr: 0 0 0 - -  Injury with Fall? 0 0 0 - -  Comment - - - - -  Risk for fall due to : No Fall Risks History of fall(s);Impaired balance/gait - - -  Follow up Falls prevention discussed Falls prevention discussed;Falls evaluation completed Education provided;Falls prevention discussed - -  Interventions:  Continue to take calcium + D dail Discussed fall prevention Continue to use cane for ambulation assistance Consider recheck DEXA   Medication management Pharmacist Clinical Goal(s): Over the next 90 days, patient will work with PharmD and providers to achieve optimal medication adherence [facility] Interventions Comprehensive medication review performed. Continue current medication management strategy Patient self care activities - Over the next 90 days, patient will: Focus on medication adherence by filling and taking medications appropriately  Take medications as prescribed Report any questions or concerns to PharmD and/or provider(s)  Please see past updates related to this goal by clicking on the "Past Updates" button in the selected goal      Medication Assistance: Application for Eliquis  medication assistance program was mailed to patient but he does not think he has  met the 3% OOP spend. He has been approved  for patient assistance for Januvia in June 2022 through 03/17/2021.  in process.   Patient's preferred pharmacy is:  [id number] [place], [place] - [address] [place] [address]Phone: [phone]: [phone]  Follow Up:  Patient agrees to Care Plan and Follow-up.    Telephone follow up appointment with care management team member scheduled for:  2 months  TCherre Robins PharmD Clinical Pharmacist LCoopers PlainsMViennaHBlue Mountain [facility] [place]

## 2021-01-15 NOTE — Patient Instructions (Signed)
Visit Information  PATIENT GOALS:  Goals Addressed             This Visit's Progress    Chronic Care Management Pharmacy Care Plan       CARE PLAN ENTRY (see longitudinal plan of care for additional care plan information)  Current Barriers:  Chronic Disease Management support, education, and care coordination needs related to Hypertension, Hyperlipidemia, Diabetes, atrial fibrillation, Heart Failure, decrease kidney function   Hypertension / High Blood pressure BP Readings from Last 3 Encounters:  11/30/20 130/75  10/20/20 126/76  09/07/20 133/80  Pharmacist Clinical Goal(s): Over the next 90 days, patient will work with PharmD and providers to maintain BP goal <140/90 Current regimen:  Amlodipine 60m daily in morning Triamterene-hydrochlorothiazide  75-53mdaily in morning Patient self care activities - Over the next 90 days, patient will: Maintain hypertension medication regimen.   Hyperlipidemia Lab Results  Component Value Date/Time   LDLCALC 86 11/30/2020 04:00 PM   LDLCALC 101 (H) 12/16/2019 03:43 PM   LDLDIRECT 65.0 11/03/2018 10:38 AM  Pharmacist Clinical Goal(s): Over the next 90 days, patient will work with PharmD and providers to achieve LDL goal < 70 Current regimen:  Diet and exercise management   Krill Oil 100078maily Interventions: Discussed past experiences with atorvastatin - caused leg pain and weakness Recommend limiting intake of food high in saturated fat / avoid fried foods; increase intake of vegetables Patient self care activities - Over the next 90 days, patient will: Achieve LDL <100 Continue Krill Oil  Diabetes Lab Results  Component Value Date/Time   HGBA1C 6.3 11/30/2020 04:00 PM   HGBA1C 6.8 (H) 08/10/2020 03:13 PM  Pharmacist Clinical Goal(s): Over the next 90 days, patient will work with PharmD and providers to achieve A1c goal <7% Current regimen:  Januvia 4m3mtablet daily in morning Glipizide 10mg80make 2 tablets with  morning meal and 2 tablets with evening meal Glucocil - take 2 tablets twice a day Interventions: Mailed PAP for Januvia Recommended checking blood glucose / sugar daily Reviewed home blood glucose readings and reviewed goals  Fasting blood glucose goal (before meals) = 80 to 130 Blood glucose goal after a meal = less than 180  Patient self care activities - Over the next 90 days, patient will: Check blood sugar once daily, document, and provide at future appointments Contact provider with any episodes of hypoglycemia Complete application for Januvia patient assistance and return to office  Atrial fibrillation  Pharmacist Clinical Goal(s) Over the next 90 days, patient will work with PharmD and providers to reduce complications associated with atrial fibrillation Current regimen:  Eliquis 2.5mg t73me daily in morning and with dinner Metoprolol succinate 25mg d51m in morning Interventions: Recommended patient check in with his pharmacy to see if he has met 3% OOP spend for Eliquis patient assistance application Patient self care activities - Over the next 90 days, patient will: Maintain current atrial fibrillation medication regimen Notify providers once 3% out of pocket has been met - bring in [place] of 20[place] [place] at front office (01/12/2021)  Decreased Kidney Function:  Pharmacist Clinical Goal(s) Over the next 90 days, patient will work with PharmD and providers to slow / prevent further decrease in kidney function Current regimen:  none Interventions: Reviewed medications for adjustment based on kidney funciton Patient self care activities - Over the next 90 days, patient will: Avoid over the counter pain relievers like Aleve / naproxen; Motrin /  ibuprofen. If you need anything for pain relief, headaches or fever - Tylenol / acetaminophen 546m up to 2 tablets every 8 hours is safer option.  See nephrologist / kidney  specialist  Medication management Pharmacist Clinical Goal(s): Over the next 90 days, patient will work with PharmD and providers to achieve optimal medication adherence Current pharmacy: [facility] Interventions Comprehensive medication review performed. Continue current medication management strategy Patient self care activities - Over the next 90 days, patient will: Focus on medication adherence by filling and taking medications appropriately  Take medications as prescribed Report any questions or concerns to PharmD and/or provider(s)  Please see past updates related to this goal by clicking on the "Past Updates" button in the selected goal          Patient verbalizes understanding of instructions provided today and agrees to [place] in [place]   Telephone follow up appointment with care management team member scheduled for: 2 months (to assess and apply for patient assistance program for 2023)  TCherre Robins PharmD Clinical Pharmacist [facility]

## 2021-01-22 ENCOUNTER — Encounter: Payer: Self-pay | Admitting: Family Medicine

## 2021-01-24 NOTE — Telephone Encounter (Signed)
Pt scheduled for tomorrow at 8:40

## 2021-01-25 ENCOUNTER — Other Ambulatory Visit: Payer: Self-pay

## 2021-01-25 ENCOUNTER — Telehealth (INDEPENDENT_AMBULATORY_CARE_PROVIDER_SITE_OTHER): Payer: PPO | Admitting: Family Medicine

## 2021-01-25 VITALS — Wt 237.0 lb

## 2021-01-25 DIAGNOSIS — I4819 Other persistent atrial fibrillation: Secondary | ICD-10-CM

## 2021-01-25 DIAGNOSIS — R600 Localized edema: Secondary | ICD-10-CM | POA: Diagnosis not present

## 2021-01-25 DIAGNOSIS — I10 Essential (primary) hypertension: Secondary | ICD-10-CM

## 2021-01-25 DIAGNOSIS — M25471 Effusion, right ankle: Secondary | ICD-10-CM | POA: Diagnosis not present

## 2021-01-25 DIAGNOSIS — E0822 Diabetes mellitus due to underlying condition with diabetic chronic kidney disease: Secondary | ICD-10-CM

## 2021-01-25 DIAGNOSIS — N183 Chronic kidney disease, stage 3 unspecified: Secondary | ICD-10-CM | POA: Diagnosis not present

## 2021-01-25 DIAGNOSIS — M79604 Pain in right leg: Secondary | ICD-10-CM | POA: Diagnosis not present

## 2021-01-25 NOTE — Assessment & Plan Note (Signed)
He feels well. No increased SOB, CP, palp, tolerating Eliquis. Continue to monitor

## 2021-01-25 NOTE — Assessment & Plan Note (Signed)
Worse over last 2 weeks and worse in right leg as compared to left leg. Encouraged compression hose, daily weights. He increased his Lasix from one tab to 2 just 2 days ago and has lost 2 pounds. Take 2 tabs daily for next 3 days then decrease to 1 tab po daily. Minimize sodium and due to increase in right leg with knee pain also will check an ultrasound for DVT exclusion

## 2021-01-25 NOTE — Assessment & Plan Note (Signed)
Monitor and report any concerns, no changes to meds. Encouraged heart healthy diet such as the DASH diet and exercise as tolerated.  ?

## 2021-01-25 NOTE — Assessment & Plan Note (Signed)
hgba1c acceptable, minimize simple carbs. Increase exercise as tolerated. Continue current meds 

## 2021-01-25 NOTE — Patient Instructions (Signed)
Edema °Edema is an abnormal buildup of fluids in the body tissues and under the skin. Swelling of the legs, feet, and ankles is a common symptom that becomes more likely as you get older. Swelling is also common in looser tissues, like around the eyes. When the affected area is squeezed, the fluid may move out of that spot and leave a dent for a few moments. This dent is called pitting edema. °There are many possible causes of edema. Eating too much salt (sodium) and being on your feet or sitting for a long time can cause edema in your legs, feet, and ankles. Hot weather may make edema worse. Common causes of edema include: °Heart failure. °Liver or kidney disease. °Weak leg blood vessels. °Cancer. °An injury. °Pregnancy. °Medicines. °Being obese. °Low protein levels in the blood. °Edema is usually painless. Your skin may look swollen or shiny. °Follow these instructions at home: °Keep the affected body part raised (elevated) above the level of your heart when you are sitting or lying down. °Do not sit still or stand for long periods of time. °Do not wear tight clothing. Do not wear garters on your upper legs. °Exercise your legs to get your circulation going. This helps to move the fluid back into your blood vessels, and it may help the swelling go down. °Wear elastic bandages or support stockings to reduce swelling as told by your health care provider. °Eat a low-salt (low-sodium) diet to reduce fluid as told by your health care provider. °Depending on the cause of your swelling, you may need to limit how much fluid you drink (fluid restriction). °Take over-the-counter and prescription medicines only as told by your health care provider. °Contact a health care provider if: °Your edema does not get better with treatment. °You have heart, liver, or kidney disease and have symptoms of edema. °You have sudden and unexplained weight gain. °Get help right away if: °You develop shortness of breath or chest pain. °You  cannot breathe when you lie down. °You develop pain, redness, or warmth in the swollen areas. °You have heart, liver, or kidney disease and suddenly get edema. °You have a fever and your symptoms suddenly get worse. °Summary °Edema is an abnormal buildup of fluids in the body tissues and under the skin. °Eating too much salt (sodium) and being on your feet or sitting for a long time can cause edema in your legs, feet, and ankles. °Keep the affected body part raised (elevated) above the level of your heart when you are sitting or lying down. °This information is not intended to replace advice given to you by your health care provider. Make sure you discuss any questions you have with your health care provider. °Document Revised: 08/03/2020 Document Reviewed: 12/28/2019 °Elsevier Patient Education © 2022 [employer] Inc[employer] ° °

## 2021-01-25 NOTE — Progress Notes (Signed)
MyChart Video Visit    Virtual Visit via Video Note   This visit type was conducted due to national recommendations for restrictions regarding the COVID-19 Pandemic (e.g. social distancing) in an effort to limit this patient's exposure and mitigate transmission in our community. This patient is at least at moderate risk for complications without adequate follow up. This format is felt to be most appropriate for this patient at this time. Physical exam was limited by quality of the video and audio technology used for the visit. S. Chism, CMA was able to get the patient set up on a video visit.  Patient location: Home Patient and provider in visit Provider location: Office  I discussed the limitations of evaluation and management by telemedicine and the availability of in person appointments. The patient expressed understanding and agreed to proceed.  Visit Date: 01/25/21  Today's healthcare provider: [name], MD     Subjective:    Patient ID: Vincent Allen, male    DOB: January 25, 1934, 87 y.o.   MRN: [id number]  Chief Complaint  Patient presents with   Leg Swelling    HPI Patient is in today for a virtual video visit for follow up on LE edema and htn disease. He has c/o worsening edema in LE's bilaterally, however RLE > LLE. He is also experiencing progressively worse pain over a couple of weeks now. Denies CP/palp/HA/congestion/fevers/falls/GI or GU c/o. Taking meds as prescribed. He reports that his current weight is 239 lbs. He experiences SOB, but this is his current baseline. No major change in diet or activity level   Past Medical History:  Diagnosis Date   Arthritis    Bradycardia    Cataract    Chronic kidney disease stage III (GFR 30-59 ml/min) 05/03/2012   Colon polyps    Complication of anesthesia    emesis   Diabetes mellitus    GERD (gastroesophageal reflux disease)    HOH (hard of hearing)    HTN (hypertension) 01/13/2013   Hyperlipidemia     Hypertension    Pacemaker 12/2016   Persistent atrial fibrillation ([place])    Presence of permanent cardiac pacemaker 09/20/2016   Symptomatic bradycardia    Vitamin D deficiency 07/17/2015    Past Surgical History:  Procedure Laterality Date   ANKLE ARTHROPLASTY     CARDIOVERSION N/A 08/20/2016   Procedure: CARDIOVERSION;  Surgeon: Sanda Klein, MD;  Location: [facility];  Service: Cardiovascular;  Laterality: N/A;   CATARACT EXTRACTION  2010, 2014   CATARACT EXTRACTION  02/28/14   CHOLECYSTECTOMY     CHOLECYSTECTOMY N/A 05/03/2012   Procedure: LAPAROSCOPIC CHOLECYSTECTOMY WITH INTRAOPERATIVE CHOLANGIOGRAM;  Surgeon: [name], MD;  Location: [facility];  Service: General;  Laterality: N/A;   INSERT / REPLACE / REMOVE PACEMAKER  09/20/2016   PACEMAKER IMPLANT N/A 09/20/2016   Procedure: Pacemaker Implant;  Surgeon: Deboraha Sprang, MD;  Location: [facility];  Service: Cardiovascular;  Laterality: N/A;   ROTATOR CUFF REPAIR     SHOULDER SURGERY     TONSILLECTOMY      Family History  Problem Relation Age of Onset   Cancer Mother        colon, breast, pancreas, skin cancer   Heart disease Father        heart valve replaced   Stroke Father    Diabetes Father    Cancer Paternal Grandmother        colon   Obesity Son    Heart disease Son  bradycardia    Social History   Socioeconomic History   Marital status: Widowed    Spouse name: Not on file   Number of children: Not on file   Years of education: Not on file   Highest education level: Not on file  Occupational History   Not on file  Tobacco Use   Smoking status: Never   Smokeless tobacco: Never  Vaping Use   Vaping Use: Never used  Substance and Sexual Activity   Alcohol use: Yes    Alcohol/week: 1.0 standard drink    Types: 1 Glasses of wine per week   Drug use: No   Sexual activity: Not on file    Comment: lives alone , no major dietary restrictions, 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  Other Topics Concern   Not on file  Social History Narrative   Not on file   Social Determinants of Health   Financial Resource Strain: Medium Risk   Difficulty of Paying Living Expenses: Somewhat hard  Food Insecurity: No Food Insecurity   Worried About Running Out of Food in the Last Year: Never true   Ran Out of Food in the Last Year: Never true  Transportation Needs: No Transportation Needs   Lack of Transportation (Medical): No   Lack of Transportation (Non-Medical): No  Physical Activity: Insufficiently Active   Days of Exercise per Week: 7 days   Minutes of Exercise per Session: 10 min  Stress: No Stress Concern Present   Feeling of Stress : Not at all  Social Connections: Socially Isolated   Frequency of Communication with Friends and Family: More than three times a week   Frequency of Social Gatherings with Friends and Family: Three times a week   Attends Religious Services: Never   Active Member of Clubs or Organizations: No   Attends Archivist Meetings: Never   Marital Status: Widowed  Human resources officer Violence: Not At Risk   Fear of Current or Ex-Partner: No   Emotionally Abused: No   Physically Abused: No   Sexually Abused: No    Outpatient Medications Prior to Visit  Medication Sig Dispense Refill   amLODipine (NORVASC) 5 MG tablet TAKE 1 TABLET BY MOUTH EVERY DAY 90 tablet 1   Calcium 600 MG tablet Take 1 tablet (600 mg total) by mouth 2 (two) times daily. 60 tablet 0   ELIQUIS 2.5 MG TABS tablet TAKE 1 TABLET BY MOUTH TWICE A DAY 60 tablet 5   furosemide (LASIX) 20 MG tablet TAKE 1-2 TABLETs BY MOUTH DAILY AS NEEDED FOR EDEMA (WEIGHT GAIN >3LBS IN 24 HRS, SOB). 60 tablet 2   glipiZIDE (GLUCOTROL) 10 MG tablet TAKE 2 TABLETS (20 MG TOTAL) BY MOUTH 2 (TWO) TIMES DAILY BEFORE A MEAL. 360 tablet 1   glucose blood (ONE TOUCH ULTRA TEST) test strip USE 1 STRIP 2 TIMES DAILY TO CHECK BLOOD SUGAR DX E08.22, N18.3 300 each 1   JANUVIA 50 MG  tablet TAKE 1 TABLET BY MOUTH EVERY DAY 30 tablet 3   Krill Oil 1000 MG CAPS Take 1 capsule by mouth daily.     metoprolol succinate (TOPROL-XL) 25 MG 24 hr tablet TAKE 1 TABLET BY MOUTH EVERY DAY 90 tablet 1   mupirocin ointment (BACTROBAN) 2 % Apply topically daily.     triamcinolone cream (KENALOG) 0.1 % Apply 1 application topically 2 (two) times daily. 80 g 0   triamterene-hydrochlorothiazide (MAXZIDE) 75-50 MG tablet TAKE 1 TABLET BY  MOUTH EVERY DAY 90 tablet 1   Vitamin D, Cholecalciferol, 25 MCG (1000 UT) CAPS Take 1 capsule by mouth daily.     Zinc 50 MG TABS Take 1 tablet by mouth daily.     No facility-administered medications prior to visit.    Allergies  Allergen Reactions   Atorvastatin     Myalgias / leg pain and weakness   Penicillins Hives and Itching    Has patient had a PCN reaction causing immediate rash, facial/tongue/throat swelling, SOB or lightheadedness with hypotension:Yes Has patient had a PCN reaction causing severe rash involving mucus membranes or skin necrosis:No Has patient had a PCN reaction that required hospitalization:No Has patient had a PCN reaction occurring within the last 10 years:No If all of the above answers are "NO", then may proceed with Cephalosporin use.    Verapamil     Junctional rhythm   Influenza Vaccines Rash    tinnitus    Review of Systems  Constitutional:  Negative for chills, fever and malaise/fatigue.  HENT:  Negative for congestion, sinus pain and sore throat.   Eyes:  Negative for blurred vision.  Respiratory:  Positive for shortness of breath. Negative for cough.   Cardiovascular:  Negative for chest pain, palpitations and leg swelling.  Gastrointestinal:  Negative for blood in stool, diarrhea, nausea and vomiting.  Genitourinary:  Negative for flank pain and frequency.  Musculoskeletal:  Negative for back pain.  Skin:  Negative for rash.  Neurological:  Negative for headaches.      Objective:    Physical  Exam Constitutional:      Appearance: Normal appearance.  HENT:     Head: Normocephalic and atraumatic.     Right Ear: External ear normal.     Left Ear: External ear normal.  Pulmonary:     Effort: Pulmonary effort is normal.  Musculoskeletal:        General: Swelling (right > left) present.     Cervical back: No rigidity.     Right lower leg: Edema present.     Left lower leg: Edema present.     Comments: -scaly  Neurological:     Mental Status: He is alert and oriented to person, place, and time.  Psychiatric:        Behavior: Behavior normal.    Wt 237 lb (107.5 kg)   BMI 32.14 kg/m  Wt Readings from Last 3 Encounters:  01/25/21 237 lb (107.5 kg)  01/01/21 234 lb (106.1 kg)  11/30/20 228 lb 3.2 oz (103.5 kg)    Diabetic Foot Exam - Simple   No data filed    Lab Results  Component Value Date   WBC 6.7 11/30/2020   HGB 13.8 11/30/2020   HCT 43.0 11/30/2020   PLT 142.0 (L) 11/30/2020   GLUCOSE 139 (H) 11/30/2020   CHOL 141 11/30/2020   TRIG 90.0 11/30/2020   HDL 36.70 (L) 11/30/2020   LDLDIRECT 65.0 11/03/2018   LDLCALC 86 11/30/2020   ALT 16 11/30/2020   AST 23 11/30/2020   NA 136 11/30/2020   K 4.3 11/30/2020   CL 101 11/30/2020   CREATININE 1.71 (H) 11/30/2020   BUN 43 (H) 11/30/2020   CO2 23 11/30/2020   TSH 4.55 11/30/2020   PSA 1.18 10/07/2012   INR 1.0 08/15/2016   HGBA1C 6.3 11/30/2020   MICROALBUR 26.7 (H) 07/15/2016    Lab Results  Component Value Date   TSH 4.55 11/30/2020   Lab Results  Component Value Date  WBC 6.7 11/30/2020   HGB 13.8 11/30/2020   HCT 43.0 11/30/2020   MCV 85.8 11/30/2020   PLT 142.0 (L) 11/30/2020   Lab Results  Component Value Date   NA 136 11/30/2020   K 4.3 11/30/2020   CO2 23 11/30/2020   GLUCOSE 139 (H) 11/30/2020   BUN 43 (H) 11/30/2020   CREATININE 1.71 (H) 11/30/2020   BILITOT 2.0 (H) 11/30/2020   ALKPHOS 125 (H) 11/30/2020   AST 23 11/30/2020   ALT 16 11/30/2020   PROT 6.9 11/30/2020    ALBUMIN 3.7 11/30/2020   CALCIUM 9.5 11/30/2020   ANIONGAP 8 07/10/2016   GFR 36.09 (L) 11/30/2020   Lab Results  Component Value Date   CHOL 141 11/30/2020   Lab Results  Component Value Date   HDL 36.70 (L) 11/30/2020   Lab Results  Component Value Date   LDLCALC 86 11/30/2020   Lab Results  Component Value Date   TRIG 90.0 11/30/2020   Lab Results  Component Value Date   CHOLHDL 4 11/30/2020   Lab Results  Component Value Date   HGBA1C 6.3 11/30/2020       Assessment & Plan:   Problem List Items Addressed This Visit     Diabetes mellitus with chronic kidney disease ([place]) (Chronic)    hgba1c acceptable, minimize simple carbs. Increase exercise as tolerated. Continue current meds      Essential hypertension    Monitor and report any concerns, no changes to meds. Encouraged heart healthy diet such as the DASH diet and exercise as tolerated.       Persistent atrial fibrillation ([place])    He feels well. No increased SOB, CP, palp, tolerating Eliquis. Continue to monitor      Pedal edema    Worse over last 2 weeks and worse in right leg as compared to left leg. Encouraged compression hose, daily weights. He increased his Lasix from one tab to 2 just 2 days ago and has lost 2 pounds. Take 2 tabs daily for next 3 days then decrease to 1 tab po daily. Minimize sodium and due to increase in right leg with knee pain also will check an ultrasound for DVT exclusion      Other Visit Diagnoses     Right leg pain    -  Primary   Relevant Orders   US Venous Img Lower Unilateral Right (DVT)   Right ankle swelling       Relevant Orders   US Venous Img Lower Unilateral Right (DVT)         No orders of the defined types were placed in this encounter.   I discussed the assessment and treatment plan with the patient. The patient was provided an opportunity to ask questions and all were answered. The patient agreed with the plan and demonstrated an understanding of the  instructions.   The patient was advised to call back or seek an in-person evaluation if the symptoms worsen or if the condition fails to improve as anticipated.  I provided 15 minutes of face-to-face time during this encounter.   [name], MD [facility] [phone] (phone) [phone] (fax)  Chaves, Suezanne Jacquet, acting as a scribe for [name], MD, have documented all relevent documentation on behalf of [name], MD, as directed by [name], MD while in the presence of [name], MD. DO:01/25/21.  I, [name], MD personally performed the  services described in this documentation. All medical record entries made by the scribe were at my direction and in my presence. I have reviewed the chart and agree that the record reflects my personal performance and is accurate and complete

## 2021-01-26 ENCOUNTER — Other Ambulatory Visit: Payer: Self-pay

## 2021-01-26 ENCOUNTER — Ambulatory Visit (HOSPITAL_BASED_OUTPATIENT_CLINIC_OR_DEPARTMENT_OTHER)
Admission: RE | Admit: 2021-01-26 | Discharge: 2021-01-26 | Disposition: A | Discharge status: Home / Self Care | Payer: PPO | Source: Ambulatory Visit | Attending: Family Medicine | Admitting: Family Medicine

## 2021-01-26 DIAGNOSIS — M25471 Effusion, right ankle: Secondary | ICD-10-CM | POA: Insufficient documentation

## 2021-01-26 DIAGNOSIS — M7989 Other specified soft tissue disorders: Secondary | ICD-10-CM | POA: Diagnosis not present

## 2021-01-26 DIAGNOSIS — R600 Localized edema: Secondary | ICD-10-CM | POA: Diagnosis not present

## 2021-01-26 DIAGNOSIS — M79604 Pain in right leg: Secondary | ICD-10-CM | POA: Insufficient documentation

## 2021-01-26 DIAGNOSIS — M7121 Synovial cyst of popliteal space [Baker], right knee: Secondary | ICD-10-CM | POA: Diagnosis not present

## 2021-01-29 ENCOUNTER — Other Ambulatory Visit: Payer: Self-pay

## 2021-01-29 DIAGNOSIS — M25869 Other specified joint disorders, unspecified knee: Secondary | ICD-10-CM

## 2021-02-01 ENCOUNTER — Ambulatory Visit (INDEPENDENT_AMBULATORY_CARE_PROVIDER_SITE_OTHER): Payer: PPO | Admitting: Orthopaedic Surgery

## 2021-02-01 ENCOUNTER — Encounter: Payer: Self-pay | Admitting: Orthopaedic Surgery

## 2021-02-01 ENCOUNTER — Other Ambulatory Visit: Payer: Self-pay

## 2021-02-01 DIAGNOSIS — M1711 Unilateral primary osteoarthritis, right knee: Secondary | ICD-10-CM | POA: Diagnosis not present

## 2021-02-01 NOTE — Progress Notes (Signed)
Office Visit Note   Patient: Vincent Allen           Date of Birth: 1933-12-14           MRN: [id number] Visit Date: 02/01/2021              Requested by: [name], MD [address] [place],  [place] [address] PCP: [name], MD   Assessment & Plan: Visit Diagnoses:  1. Unilateral primary osteoarthritis, right knee     Plan: Vincent Allen recently had a Doppler study to rule out DVT of his right lower extremity.  The study was negative but it did demonstrate a popliteal cyst and he was referred to the office for further evaluation.  He relates not having any trouble with his knee and's, specifically, no pain.  On occasion it will swell.  He does use a cane for support and balance.  I could not palpate a popliteal cyst.  He had minimal swelling of the knee with full extension of flexion 95 degrees and no localized areas of tenderness.  There is no reason to treat the popliteal cyst and as he is not having any symptoms referable to the knee there is no reason to consider any further treatment.  Discussed all the above with him.  Answered his questions we will see him back as needed. Vincent Allen had films of his left knee in the past demonstrating tricompartmental degenerative changes with CPPD.  I suspect he has the same in the right knee  Follow-Up Instructions: Return if symptoms worsen or fail to improve.   Orders:  No orders of the defined types were placed in this encounter.  No orders of the defined types were placed in this encounter.     Procedures: No procedures performed   Clinical Data: No additional findings.   Subjective: Chief Complaint  Patient presents with   Right Knee - Pain  Patient presents today for his right knee. He said that he was having an ultrasound done to rule out DVT and the sonographer saw a cyst behind his right knee. He said that his knee does swell some, but has no pain. He states that his knee does not bother  him.  HPI  Review of Systems   Objective: Vital Signs: There were no vitals taken for this visit.  Physical Exam Constitutional:      Appearance: He is well-developed.  Pulmonary:     Effort: Pulmonary effort is normal.  Skin:    General: Skin is warm and dry.  Neurological:     Mental Status: He is alert and oriented to person, place, and time.  Psychiatric:        Behavior: Behavior normal.    Ortho Exam awake alert and oriented x3.  Comfortable sitting.  Right knee with minimal effusion.  The knee was not hot warm or red.  I could not palpate a popliteal cyst.  No popliteal pain.  No calf pain.  Has some venous stasis changes throughout the leg.  Full extension of flexed about 95 degrees without instability.  Specialty Comments:  No specialty comments available.  Imaging: No results found.   PMFS History: Patient Active Problem List   Diagnosis Date Noted   Unilateral primary osteoarthritis, right knee 02/01/2021   Fatty liver 12/01/2020   Pedal edema 08/14/2020   Knee pain, bilateral 09/20/2019   Morbid obesity ([place]) 09/14/2019   Urinary frequency 11/06/2016   S/P placement  of cardiac pacemaker 10/24/2016   Chronic diastolic CHF (congestive heart failure) ([place]) 10/24/2016   Cardiac device in situ    Debility 08/15/2016   Persistent atrial fibrillation ([place]) 07/09/2016   Vitamin D deficiency 07/17/2015   Preventative health care 07/17/2015   Lumbago 12/02/2014   Pain of toe of left foot 03/14/2014   Medicare annual wellness visit, subsequent 01/17/2013   Essential hypertension 01/13/2013   Chronic kidney disease stage III (GFR 30-59 ml/min) 05/03/2012   Insomnia 06/27/2011   Osteopenia 09/02/2010   Family history of colon cancer 09/02/2010   Diabetes mellitus with chronic kidney disease ([place])    Hypertensive heart disease    Hyperlipidemia LDL goal <70    Carotid artery disease (HCC)    GERD (gastroesophageal reflux disease)    CTS (carpal tunnel  syndrome) 08/28/2010   Past Medical History:  Diagnosis Date   Arthritis    Bradycardia    Cataract    Chronic kidney disease stage III (GFR 30-59 ml/min) 05/03/2012   Colon polyps    Complication of anesthesia    emesis   Diabetes mellitus    GERD (gastroesophageal reflux disease)    HOH (hard of hearing)    HTN (hypertension) 01/13/2013   Hyperlipidemia    Hypertension    Pacemaker 12/2016   Persistent atrial fibrillation (HCC)    Presence of permanent cardiac pacemaker 09/20/2016   Symptomatic bradycardia    Vitamin D deficiency 07/17/2015    Family History  Problem Relation Age of Onset   Cancer Mother        colon, breast, pancreas, skin cancer   Heart disease Father        heart valve replaced   Stroke Father    Diabetes Father    Cancer Paternal Grandmother        colon   Obesity Son    Heart disease Son        bradycardia    Past Surgical History:  Procedure Laterality Date   ANKLE ARTHROPLASTY     CARDIOVERSION N/A 08/20/2016   Procedure: CARDIOVERSION;  Surgeon: Sanda Klein, MD;  Location: [facility];  Service: Cardiovascular;  Laterality: N/A;   CATARACT EXTRACTION  2010, 2014   CATARACT EXTRACTION  02/28/14   CHOLECYSTECTOMY     CHOLECYSTECTOMY N/A 05/03/2012   Procedure: LAPAROSCOPIC CHOLECYSTECTOMY WITH INTRAOPERATIVE CHOLANGIOGRAM;  Surgeon: [name], MD;  Location: [facility];  Service: General;  Laterality: N/A;   INSERT / REPLACE / REMOVE PACEMAKER  09/20/2016   PACEMAKER IMPLANT N/A 09/20/2016   Procedure: Pacemaker Implant;  Surgeon: Deboraha Sprang, MD;  Location: [facility];  Service: Cardiovascular;  Laterality: N/A;   ROTATOR CUFF REPAIR     SHOULDER SURGERY     TONSILLECTOMY     Social History   Occupational History   Not on file  Tobacco Use   Smoking status: Never   Smokeless tobacco: Never  Vaping Use   Vaping Use: Never used  Substance and Sexual Activity   Alcohol use: Yes    Alcohol/week: 1.0 standard drink     Types: 1 Glasses of wine per week   Drug use: No   Sexual activity: Not on file    Comment: lives alone , no major dietary restrictions, 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.

## 2021-02-14 ENCOUNTER — Other Ambulatory Visit: Payer: Self-pay | Admitting: Family Medicine

## 2021-02-19 DIAGNOSIS — R3914 Feeling of incomplete bladder emptying: Secondary | ICD-10-CM | POA: Diagnosis not present

## 2021-02-19 DIAGNOSIS — N401 Enlarged prostate with lower urinary tract symptoms: Secondary | ICD-10-CM | POA: Diagnosis not present

## 2021-02-24 ENCOUNTER — Other Ambulatory Visit: Payer: Self-pay | Admitting: Family Medicine

## 2021-02-25 ENCOUNTER — Other Ambulatory Visit: Payer: Self-pay | Admitting: Family Medicine

## 2021-02-26 ENCOUNTER — Other Ambulatory Visit: Payer: Self-pay

## 2021-02-26 ENCOUNTER — Encounter: Payer: Self-pay | Admitting: Podiatry

## 2021-02-26 ENCOUNTER — Ambulatory Visit: Payer: PPO | Admitting: Podiatry

## 2021-02-26 DIAGNOSIS — I872 Venous insufficiency (chronic) (peripheral): Secondary | ICD-10-CM

## 2021-02-26 DIAGNOSIS — E119 Type 2 diabetes mellitus without complications: Secondary | ICD-10-CM | POA: Diagnosis not present

## 2021-02-26 DIAGNOSIS — B351 Tinea unguium: Secondary | ICD-10-CM

## 2021-02-26 DIAGNOSIS — N183 Chronic kidney disease, stage 3 unspecified: Secondary | ICD-10-CM | POA: Diagnosis not present

## 2021-02-26 DIAGNOSIS — M2142 Flat foot [pes planus] (acquired), left foot: Secondary | ICD-10-CM

## 2021-02-26 DIAGNOSIS — L84 Corns and callosities: Secondary | ICD-10-CM | POA: Diagnosis not present

## 2021-02-26 DIAGNOSIS — M79674 Pain in right toe(s): Secondary | ICD-10-CM | POA: Diagnosis not present

## 2021-02-26 DIAGNOSIS — M79675 Pain in left toe(s): Secondary | ICD-10-CM

## 2021-02-26 DIAGNOSIS — E0822 Diabetes mellitus due to underlying condition with diabetic chronic kidney disease: Secondary | ICD-10-CM

## 2021-02-26 DIAGNOSIS — M2141 Flat foot [pes planus] (acquired), right foot: Secondary | ICD-10-CM

## 2021-02-26 NOTE — Progress Notes (Signed)
ANNUAL DIABETIC FOOT EXAM  Subjective: Vincent Allen presents today for for annual diabetic foot examination, at risk foot care. Pt has h/o NIDDM with chronic kidney disease, and painful elongated mycotic toenails 1-5 bilaterally which are tender when wearing enclosed shoe gear. Pain is relieved with periodic professional debridement..  Patient relates 25-30 year h/o diabetes.  Patient denies any h/o foot wounds.  Patient's blood sugar was 110 mg/dl this morning.   Patient's son, Vincent Allen, is present during today's visit.  Vincent Allen states he was prescribed a cream for his leg rash by Dr. [name], but he feels it is not working. Son, Vincent Allen, states he also hasn't used it in a while. He states he still does have itching on occasion.        Vincent Lukes, Vincent Allen is patient's PCP. Last visit was 01/25/2021.  Past Medical History:  Diagnosis Date   Arthritis    Bradycardia    Cataract    Chronic kidney disease stage III (GFR 30-59 ml/min) 05/03/2012   Colon polyps    Complication of anesthesia    emesis   Diabetes mellitus    GERD (gastroesophageal reflux disease)    HOH (hard of hearing)    HTN (hypertension) 01/13/2013   Hyperlipidemia    Hypertension    Pacemaker 12/2016   Persistent atrial fibrillation (HCC)    Presence of permanent cardiac pacemaker 09/20/2016   Symptomatic bradycardia    Vitamin D deficiency 07/17/2015   Patient Active Problem List   Diagnosis Date Noted   Unilateral primary osteoarthritis, right knee 02/01/2021   Fatty liver 12/01/2020   Pedal edema 08/14/2020   Knee pain, bilateral 09/20/2019   Morbid obesity ([place]) 09/14/2019   Urinary frequency 11/06/2016   S/P placement of cardiac pacemaker 10/24/2016   Chronic diastolic CHF (congestive heart failure) ([place]) 10/24/2016   Cardiac device in situ    Debility 08/15/2016   Persistent atrial fibrillation ([place]) 07/09/2016   Vitamin D deficiency 07/17/2015   Preventative health care 07/17/2015    Lumbago 12/02/2014   Pain of toe of left foot 03/14/2014   Medicare annual wellness visit, subsequent 01/17/2013   Essential hypertension 01/13/2013   Chronic kidney disease stage III (GFR 30-59 ml/min) 05/03/2012   Insomnia 06/27/2011   Osteopenia 09/02/2010   Family history of colon cancer 09/02/2010   Diabetes mellitus with chronic kidney disease ([place])    Hypertensive heart disease    Hyperlipidemia LDL goal <70    Carotid artery disease ([place])    GERD (gastroesophageal reflux disease)    CTS (carpal tunnel syndrome) 08/28/2010   Past Surgical History:  Procedure Laterality Date   ANKLE ARTHROPLASTY     CARDIOVERSION N/A 08/20/2016   Procedure: CARDIOVERSION;  Surgeon: Sanda Klein, Vincent Allen;  Location: [facility];  Service: Cardiovascular;  Laterality: N/A;   CATARACT EXTRACTION  2010, 2014   CATARACT EXTRACTION  02/28/14   CHOLECYSTECTOMY     CHOLECYSTECTOMY N/A 05/03/2012   Procedure: LAPAROSCOPIC CHOLECYSTECTOMY WITH INTRAOPERATIVE CHOLANGIOGRAM;  Surgeon: [name], Vincent Allen;  Location: [facility];  Service: General;  Laterality: N/A;   INSERT / REPLACE / REMOVE PACEMAKER  09/20/2016   PACEMAKER IMPLANT N/A 09/20/2016   Procedure: Pacemaker Implant;  Surgeon: Deboraha Sprang, Vincent Allen;  Location: [facility];  Service: Cardiovascular;  Laterality: N/A;   ROTATOR CUFF REPAIR     SHOULDER SURGERY     TONSILLECTOMY     Current Outpatient Medications on File Prior to Visit  Medication Sig Dispense  Refill   amLODipine (NORVASC) 5 MG tablet TAKE 1 TABLET BY MOUTH EVERY DAY 90 tablet 1   Calcium 600 MG tablet Take 1 tablet (600 mg total) by mouth 2 (two) times daily. 60 tablet 0   ELIQUIS 2.5 MG TABS tablet TAKE 1 TABLET BY MOUTH TWICE A DAY 60 tablet 5   finasteride (PROSCAR) 5 MG tablet Take 5 mg by mouth daily.     furosemide (LASIX) 20 MG tablet TAKE 1-2 TABLETs BY MOUTH DAILY AS NEEDED FOR EDEMA (WEIGHT GAIN >3LBS IN 24 HRS, SOB). 60 tablet 2   glipiZIDE (GLUCOTROL) 10 MG tablet TAKE  2 TABLETS (20 MG TOTAL) BY MOUTH 2 (TWO) TIMES DAILY BEFORE A MEAL. 360 tablet 1   glucose blood (ONE TOUCH ULTRA TEST) test strip USE 1 STRIP 2 TIMES DAILY TO CHECK BLOOD SUGAR DX E08.22, N18.3 300 each 1   JANUVIA 50 MG tablet TAKE 1 TABLET BY MOUTH EVERY DAY 30 tablet 3   Krill Oil 1000 MG CAPS Take 1 capsule by mouth daily.     metoprolol succinate (TOPROL-XL) 25 MG 24 hr tablet TAKE 1 TABLET BY MOUTH EVERY DAY 90 tablet 1   mupirocin ointment (BACTROBAN) 2 % Apply topically daily.     silodosin (RAPAFLO) 4 MG CAPS capsule Take 4 mg by mouth daily.     triamcinolone cream (KENALOG) 0.1 % Apply 1 application topically 2 (two) times daily. 80 g 0   triamterene-hydrochlorothiazide (MAXZIDE) 75-50 MG tablet TAKE 1 TABLET BY MOUTH EVERY DAY 90 tablet 1   Vitamin D, Cholecalciferol, 25 MCG (1000 UT) CAPS Take 1 capsule by mouth daily.     Zinc 50 MG TABS Take 1 tablet by mouth daily.     No current facility-administered medications on file prior to visit.    Allergies  Allergen Reactions   Atorvastatin     Myalgias / leg pain and weakness   Penicillins Hives and Itching    Has patient had a PCN reaction causing immediate rash, facial/tongue/throat swelling, SOB or lightheadedness with hypotension:Yes Has patient had a PCN reaction causing severe rash involving mucus membranes or skin necrosis:No Has patient had a PCN reaction that required hospitalization:No Has patient had a PCN reaction occurring within the last 10 years:No If all of the above answers are "NO", then may proceed with Cephalosporin use.    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.   Family History  Problem Relation Age of Onset   Cancer Mother        colon, breast, pancreas, skin cancer   Heart disease Father        heart valve replaced   Stroke Father    Diabetes Father    Cancer Paternal Grandmother        colon   Obesity Son    Heart disease Son        bradycardia  Immunization History  Administered Date(s) Administered   Pneumococcal Conjugate-13 03/23/2015   Pneumococcal Polysaccharide-23 06/27/2011   Tdap 05/19/2014     Review of Systems: Negative except as noted in the HPI.   Objective: There were no vitals filed for this visit.  Vincent Allen is a pleasant 87 y.o. male in NAD. AAO X 3.  Vascular Examination: Capillary refill time to digits immediate b/l. Palpable DP pulse(s) b/l LE. Faintly palpable PT pulse(s) b/l lower extremities. Pedal hair sparse. No pain with calf compression RLE. Lower extremity skin temperature gradient within normal limits. Dependent rubor noted b/l lower extremities. Trace edema noted BLE. Evidence of chronic venous insufficiency b/l LE. No cyanosis or clubbing noted b/l LE.  Dermatological Examination: No open wounds b/l LE. No interdigital macerations noted b/l LE. Toenails 1-5 b/l elongated, discolored, dystrophic, thickened, crumbly with subungual debris and tenderness to dorsal palpation. Hyperkeratotic lesion(s) plantar heel pad of both feet.  No erythema, no edema, no drainage, no fluctuance. Hyperpigmentation consistent with findings of chronic venous insufficiency is present b/l lower extremities.  Musculoskeletal Examination: Muscle strength 5/5 to all lower extremity muscle groups bilaterally. No pain, crepitus or joint limitation noted with ROM bilateral LE. Pes planus deformity noted bilateral LE. Utilizes cane for ambulation assistance.  Footwear Assessment: Does  the patient wear appropriate shoes? Yes. Does the patient need inserts/orthotics? No.  Neurological Examination: Protective sensation intact 5/5 intact bilaterally with 10g monofilament b/l.  Hemoglobin A1C Latest Ref Rng & Units 11/30/2020 08/10/2020 04/20/2020  HGBA1C 4.6 - 6.5 % 6.3 6.8(H) 7.0(H)  Some recent data might be hidden   Assessment: 1. Pain due to onychomycosis of toenails of both feet   2. Callus   3. Stasis dermatitis of both legs   4. Pes planus of both feet   5. Diabetes mellitus due to underlying condition with stage 3 chronic kidney disease, unspecified whether long term insulin use, unspecified whether stage 3a or 3b CKD ([place])   6. Encounter for diabetic foot exam ([place])     ADA Risk Categorization: Low Risk :  Patient has all of the following: Intact protective sensation No prior foot ulcer  No severe deformity Pedal pulses present  Plan: -Examined patient. -Photos taken of legs on today's visit. Will send to Dr. [name]. -Discussed diabetic shoes on today's visit. Patient declines on today. He has no threatening preulcerative lesions. Calluses should do well with periodic professional debridement. -Diabetic foot examination performed today. -Mycotic toenails 1-5 bilaterally were debrided in length and girth with sterile nail nippers and dremel without incident. -Callus(es) plantar heel pad of both feet pared utilizing sterile scalpel blade without complication or incident. Total number debrided =2. -Patient/POA to call should there be question/concern in the interim.  Return in about 3 months (around 05/27/2021).  Marzetta Board, DPM

## 2021-03-07 ENCOUNTER — Ambulatory Visit (INDEPENDENT_AMBULATORY_CARE_PROVIDER_SITE_OTHER): Payer: PPO | Admitting: Pharmacist

## 2021-03-07 DIAGNOSIS — R600 Localized edema: Secondary | ICD-10-CM

## 2021-03-07 DIAGNOSIS — I779 Disorder of arteries and arterioles, unspecified: Secondary | ICD-10-CM

## 2021-03-07 DIAGNOSIS — E0822 Diabetes mellitus due to underlying condition with diabetic chronic kidney disease: Secondary | ICD-10-CM

## 2021-03-07 DIAGNOSIS — I10 Essential (primary) hypertension: Secondary | ICD-10-CM

## 2021-03-07 DIAGNOSIS — E785 Hyperlipidemia, unspecified: Secondary | ICD-10-CM

## 2021-03-07 DIAGNOSIS — N183 Chronic kidney disease, stage 3 unspecified: Secondary | ICD-10-CM

## 2021-03-07 DIAGNOSIS — I4819 Other persistent atrial fibrillation: Secondary | ICD-10-CM

## 2021-03-13 NOTE — Patient Instructions (Addendum)
Vincent Allen It was a pleasure speaking with you today.  I have attached a summary of our visit today and information about your health goals.  I recommend tyring either Eucerin or Aquaphor ointment on your legs for the scaling. If not better in 7 days contact office (if call immediately if symptoms worsen)   Our next appointment is by telephone on April 16, 2021 at 1:45pm  Please call the care guide team at [phone] if you need to cancel or reschedule your appointment.   If you have any questions or concerns, please feel free to contact me either at the phone number below or with a MyChart message.   Keep up the good work!  Cherre Robins, PharmD Clinical Pharmacist [facility] [phone] (direct line)  [phone] (main office number)  CARE PLAN ENTRY Updated 03/07/2021    Hypertension / High Blood pressure BP Readings from Last 3 Encounters:  11/30/20 130/75  10/20/20 126/76  09/07/20 133/80   Pharmacist Clinical Goal(s): Over the next 90 days, patient will work with PharmD and providers to maintain BP goal <140/90 Current regimen:  Amlodipine 5mg  daily in morning Triamterene-hydrochlorothiazide  75-50mg  daily in morning Interventions: Continue current hypertension medication regimen.  Reviewed blood pressure goal Consider using Eucerin or Aquaphor on legs to help with dryness and scaling.  Report to PCP if no improvement in 7 days.   Hyperlipidemia Lab Results  Component Value Date/Time   LDLCALC 86 11/30/2020 04:00 PM   LDLCALC 101 (H) 12/16/2019 03:43 PM   LDLDIRECT 65.0 11/03/2018 10:38 AM   Pharmacist Clinical Goal(s): Over the next 90 days, patient will work with PharmD and providers to achieve LDL goal < 70 Current regimen:  Diet and exercise management   Krill Oil 1000mg  daily Interventions: Discussed past experiences with atorvastatin - caused leg pain and weakness Recommend limiting intake of food high in  saturated fat / avoid fried foods; increase intake of vegetables Patient self care activities - Over the next 90 days, patient will: Achieve LDL < 70 Continue Krill Oil  Diabetes Lab Results  Component Value Date/Time   HGBA1C 6.3 11/30/2020 04:00 PM   HGBA1C 6.8 (H) 08/10/2020 03:13 PM   Pharmacist Clinical Goal(s): Over the next 90 days, patient will work with PharmD and providers to achieve A1c goal <7% Current regimen:  Januvia 50mg  1 tablet daily in morning Glipizide 10mg  - take 2 tablets with morning meal and 2 tablets with evening meal Glucocil - take 2 tablets twice a day Interventions: Requested refill for Januvia through [employer] patient assistance program  Recommended checking blood glucose / sugar daily Reviewed home blood glucose readings and reviewed goals  Fasting blood glucose goal (before meals) = 80 to 130 Blood glucose goal after a meal = less than 180  Patient self care activities - Over the next 90 days, patient will: Check blood sugar once daily, document, and provide at future appointments Contact provider with any episodes of hypoglycemia  Atrial fibrillation  Pharmacist Clinical Goal(s) Over the next 90 days, patient will work with PharmD and providers to reduce complications associated with atrial fibrillation Current regimen:  Eliquis 2.5mg  twice daily in morning and with dinner Metoprolol succinate 25mg  daily in morning Interventions: Maintain current atrial fibrillation medication regimen Will continue to monitor in 2023 for patient assistance program needs regarding Eliquis  Decreased Kidney Function:  Pharmacist Clinical Goal(s) Over the next 90 days, patient will work with PharmD and providers to slow / prevent  further decrease in kidney function Current regimen:  none Interventions: Reviewed medications for adjustment based on kidney funciton Patient self care activities - Over the next 90 days, patient will: Avoid over the counter pain  relievers like Aleve / naproxen; Motrin / ibuprofen. If you need anything for pain relief, headaches or fever - Tylenol / acetaminophen 500mg  up to 2 tablets every 8 hours is safer option.   Medication management Pharmacist Clinical Goal(s): Over the next 90 days, patient will work with PharmD and providers to achieve optimal medication adherence Current pharmacy: [facility] Interventions Comprehensive medication review performed. Continue current medication management strategy Patient self care activities - Over the next 90 days, patient will: Focus on medication adherence by filling and taking medications appropriately  Take medications as prescribed Report any questions or concerns to PharmD and/or provider(s)  Patient verbalizes understanding of instructions provided today and agrees to view in [place].

## 2021-03-13 NOTE — Chronic Care Management (AMB) (Signed)
Chronic Care Management Pharmacy Note  03/13/2021 Name:  Vincent Allen MRN:  [id number] DOB:  1934-01-05  Summary:  Patient's states he legs are still very scaly. Denies itching. He has completed Triamcinolone 0.1% cream. Recommended he try Eucerin or Aquaphor ointment for 7 days, if not improved, call office.  Assisted patient with requesting last refill for 2022 for Januiva through patient assistance. Could consider change to SGLT2 next year [place] has a good patient assistance program) since patient also has CHF and CKD 3.  Patient has appointment to see PCP 04/02/2021.  Subjective: Vincent Allen is an 87 y.o. year old male who is a primary patient of [name], MD.  The CCM team was consulted for assistance with disease management and care coordination needs.    Engaged with patient by telephone for follow up visit in response to provider referral for pharmacy case management and/or care coordination services.   Consent to Services:  The patient was given information about Chronic Care Management services, agreed to services, and gave verbal consent prior to initiation of services.  Please see initial visit note for detailed documentation.   Patient Care Team: [name], MD as PCP - General (Family Medicine) Debara Pickett Nadean Corwin, MD as PCP - Cardiology (Cardiology) Clent Jacks, MD as Consulting Physician (Ophthalmology) Danella Sensing, MD as Consulting Physician (Dermatology) Dorothy Spark, MD (Inactive) as Consulting Physician (Cardiology) [name], MD as Consulting Physician (Orthopedic Surgery) Regal, Tamala Fothergill, DPM as Consulting Physician (Podiatry) Cherre Robins, RPH-CPP (Pharmacist)  Recent Office Visits:  01/25/2021 - Fam Med (Dr [name]) Video Visit for right leg pain and swelling / HTN. Recommended compression hose; increase furosemide to 2 tablets for 3 days, then go back to 1 tablet Vincent. Ordered ultrasound to r/o DVT.  Ultrasound showed  no DVT in right leg.   Recent Consults:  02/26/2021 - Podiatry (Dr Elisha Ponder) Annual Diabetic foot exam 02/01/2021 [facility] (Dr Durward Fortes) Seen for right knee pain. Noted to have osteoarthritis of right knee and popliteal cyst per ultrasound. No intervention at this time as patient denied any symptoms.  Hospital visits: None in previous 6 months  Objective:  Lab Results  Component Value Date   CREATININE 1.71 (H) 11/30/2020   CREATININE 1.69 (H) 09/15/2020   CREATININE 1.67 (H) 08/10/2020    Lab Results  Component Value Date   HGBA1C 6.3 11/30/2020   Last diabetic Eye exam:  Lab Results  Component Value Date/Time   HMDIABEYEEXA No Retinopathy 05/26/2019 12:00 AM    Last diabetic Foot exam: No results found for: HMDIABFOOTEX      Component Value Date/Time   CHOL 141 11/30/2020 1600   TRIG 90.0 11/30/2020 1600   HDL 36.70 (L) 11/30/2020 1600   CHOLHDL 4 11/30/2020 1600   VLDL 18.0 11/30/2020 1600   LDLCALC 86 11/30/2020 1600   LDLCALC 101 (H) 12/16/2019 1543   LDLDIRECT 65.0 11/03/2018 1038    Hepatic Function Latest Ref Rng & Units 11/30/2020 09/15/2020 08/10/2020  Total Protein 6.0 - 8.3 g/dL 6.9 6.7 6.8  Albumin 3.5 - 5.2 g/dL 3.7 3.7 3.8  AST 0 - 37 U/L 23 27 32  ALT 0 - 53 U/L _0 Alk Phosphatase 39 - 117 U/L 125(H) 129(H) 172(H)  Total Bilirubin 0.2 - 1.2 mg/dL 2.0(H) 1.7(H) 2.1(H)  Bilirubin, Direct 0.0 - 0.3 mg/dL - - -    Lab Results  Component Value Date/Time   TSH 4.55 11/30/2020 04:00  PM   TSH 4.67 (H) 08/10/2020 03:13 PM   FREET4 1.01 08/11/2020 04:25 PM    CBC Latest Ref Rng & Units 11/30/2020 08/10/2020 04/20/2020  WBC 4.0 - 10.5 K/uL 6.7 8.2 7.6  Hemoglobin 13.0 - 17.0 g/dL 13.8 13.5 13.3  Hematocrit 39.0 - 52.0 % 43.0 41.6 42.2  Platelets 150.0 - 400.0 K/uL 142.0(L) 158.0 175.0    Lab Results  Component Value Date/Time   VD25OH 76.09 08/10/2020 03:13 PM   VD25OH 74.75 04/20/2020 11:06 AM    Clinical ASCVD: Yes  The ASCVD Risk  score (Arnett DK, et al., 2019) failed to calculate for the following reasons:   The 2019 ASCVD risk score is only valid for ages 72 to 29     Social History   Tobacco Use  Smoking Status Never  Smokeless Tobacco Never   BP Readings from Last 3 Encounters:  11/30/20 130/75  10/20/20 126/76  09/07/20 133/80   Pulse Readings from Last 3 Encounters:  11/30/20 79  10/20/20 66  09/07/20 65   Wt Readings from Last 3 Encounters:  01/25/21 237 lb (107.5 kg)  01/01/21 234 lb (106.1 kg)  11/30/20 228 lb 3.2 oz (103.5 kg)    Assessment: Review of patient past medical history, allergies, medications, health status, including review of consultants reports, laboratory and other test data, was performed as part of comprehensive evaluation and provision of chronic care management services.   SDOH:  (Social Determinants of Health) assessments and interventions performed:     CCM Care Plan  Allergies  Allergen Reactions   Atorvastatin     Myalgias / leg pain and weakness   Penicillins Hives and Itching    Has patient had a PCN reaction causing immediate rash, facial/tongue/throat swelling, SOB or lightheadedness with hypotension:Yes Has patient had a PCN reaction causing severe rash involving mucus membranes or skin necrosis:No Has patient had a PCN reaction that required hospitalization:No Has patient had a PCN reaction occurring within the last 10 years:No If all of the above answers are "NO", then may proceed with Cephalosporin use.    Verapamil     Junctional rhythm   Influenza Vaccines Rash    tinnitus    Medications Reviewed Today     Reviewed by Cherre Robins, RPH-CPP (Pharmacist) on 03/07/21 at [facility] List Status: <None>   Medication Order Taking? Sig Documenting Provider Last Dose Status Informant  amLODipine (NORVASC) 5 MG tablet [id number] Yes TAKE 1 TABLET BY MOUTH EVERY DAY [name], MD Taking Active   Calcium 600 MG tablet [id number] Yes Take 1 tablet (600  mg total) by mouth 2 (two) times Vincent. Burnice Logan, MD Taking Active Self  ELIQUIS 2.5 MG TABS tablet [id number] Yes TAKE 1 TABLET BY MOUTH TWICE A DAY [name], MD Taking Active   finasteride (PROSCAR) 5 MG tablet [id number] Yes Take 5 mg by mouth Vincent. [provider] Taking Active   furosemide (LASIX) 20 MG tablet [id number] Yes TAKE 1-2 TABLETS BY MOUTH Vincent AS NEEDED FOR EDEMA (WEIGHT GAIN >3LBS IN 24 HRS, SOB). [name], MD Taking Active   glipiZIDE (GLUCOTROL) 10 MG tablet [id number] Yes TAKE 2 TABLETS (20 MG TOTAL) BY MOUTH 2 (TWO) TIMES Vincent BEFORE A MEAL. [name], MD Taking Active   glucose blood (ONE TOUCH ULTRA TEST) test strip [id number] Yes USE 1 STRIP 2 TIMES Vincent TO CHECK BLOOD SUGAR DX E08.22, N18.3 [name], MD Taking Active   JANUVIA 50  MG tablet [id number] Yes TAKE 1 TABLET BY MOUTH EVERY DAY [name], MD Taking Active            Med Note Antony Contras, [place] B   Wed Mar 07, 2021  1:39 PM) Approved to get from [employer] thru 03/17/2021  Krill Oil 1000 MG CAPS [id number] Yes Take 1 capsule by mouth Vincent. [provider] Taking Active   metoprolol succinate (TOPROL-XL) 25 MG 24 hr tablet [id number] Yes TAKE 1 TABLET BY MOUTH EVERY DAY [name], MD Taking Active   mupirocin ointment (BACTROBAN) 2 % [id number] No Apply topically Vincent.  Patient not taking: Reported on 03/07/2021   [provider] Not Taking Active   silodosin (RAPAFLO) 4 MG CAPS capsule [id number] Yes Take 4 mg by mouth Vincent. [provider] Taking Active   triamcinolone cream (KENALOG) 0.1 % [id number] Yes APPLY TO AFFECTED AREA TWICE A DAY [name], MD Taking Active   triamterene-hydrochlorothiazide (MAXZIDE) 75-50 MG tablet [id number] Yes TAKE 1 TABLET BY MOUTH EVERY DAY [name], MD Taking Active   Vitamin D, Cholecalciferol, 25 MCG (1000 UT) CAPS [id number] Yes Take 1 capsule by mouth Vincent. [provider] Taking Active    Zinc 50 MG TABS [id number] Yes Take 1 tablet by mouth Vincent. [provider] Taking Active             Patient Active Problem List   Diagnosis Date Noted   Unilateral primary osteoarthritis, right knee 02/01/2021   Fatty liver 12/01/2020   Pedal edema 08/14/2020   Knee pain, bilateral 09/20/2019   Morbid obesity ([place]) 09/14/2019   Urinary frequency 11/06/2016   S/P placement of cardiac pacemaker 10/24/2016   Chronic diastolic CHF (congestive heart failure) ([place]) 10/24/2016   Cardiac device in situ    Debility 08/15/2016   Persistent atrial fibrillation ([place]) 07/09/2016   Vitamin D deficiency 07/17/2015   Preventative health care 07/17/2015   Lumbago 12/02/2014   Pain of toe of left foot 03/14/2014   Medicare annual wellness visit, subsequent 01/17/2013   Essential hypertension 01/13/2013   Chronic kidney disease stage III (GFR 30-59 ml/min) 05/03/2012   Insomnia 06/27/2011   Osteopenia 09/02/2010   Family history of colon cancer 09/02/2010   Diabetes mellitus with chronic kidney disease ([place])    Hypertensive heart disease    Hyperlipidemia LDL goal <70    Carotid artery disease ([facility])    GERD (gastroesophageal reflux disease)    CTS (carpal tunnel syndrome) 08/28/2010    Immunization History  Administered Date(s) Administered   Pneumococcal Conjugate-13 03/23/2015   Pneumococcal Polysaccharide-23 06/27/2011   Tdap 05/19/2014    Conditions to be addressed/monitored: Atrial Fibrillation, CHF, CAD, HTN, HLD, DMII and osteopenia; CKD - stage 3b  Care Plan : General Pharmacy (Adult)  Updates made by Cherre Robins, RPH-CPP since 03/13/2021 12:00 AM     Problem: Provide education, support and care coordination for medication therapy and chronic conditions   Priority: High  Onset Date: 07/13/2020  Note:   CARE PLAN ENTRY   Current Barriers:  Chronic Disease Management support, education, and care coordination needs related to Hypertension, Hyperlipidemia,  Diabetes, atrial fibrillation, Heart Failure, CKD - 3   Hypertension / High Blood pressure  BP goal <140/90 - Controlled Current regimen:  Amlodipine 45m Vincent in morning Triamterene-hydrochlorothiazide  75-522mdaily in morning Checks blood pressure at home sometimes but not in the last week. No readings to report.  Denies shortness of breath or lower extremity  edema (he has been having shortness of breath per visit on 01/25/2021.  He does reports that he still has a rash on his legs. Does not itch but mostly scaling. He has been using triamcinolone 0.1% cream twice a day but he is now out and has not really helped.  Interventions:  Recommended continue current antihypertensive therapy Reviewed blood pressure goal Consider using Eucerin or Aquaphor on legs to help with dryness and scaling.  Report to PCP if no improvement in 7 days.    Hyperlipidemia  LDL goal < 70 - not currently at goal (last LDL was 86)  Tried atorvastatin in past - caused leg pain and weakness Current regimen:  Diet and exercise management   Krill Oil 1024m Vincent Interventions: Consider retrial of alternative statin like  rosuvastatin 572mdaily - patient declined Recommend limiting intake of food high in saturated fat / avoid fried foods; increase intake of vegetables Continue Krill Oil  Diabetes Lab Results  Component Value Date   HGBA1C 6.3 11/30/2020  . A1c is at goal Current regimen:  Januvia 5026m tablet Vincent in morning Glipizide 18m48mtake 2 tablets with morning meal and 2 tablets with evening meal Glucocil - take 2 tablets twice a day Checks BG Vincent  Recent home blood glucose range - 85 to 142 Denies hypoglycemic events or symptoms. Patient received Januvia from patient assistance program in 2022. I assisted in requesting refill from [employer] today. Patient reports he received [id number] for patient assistance program for Januvia and he mailed back to the patient assistance program program  however I spoke with representative at [employer] they have not received yet.  Interventions: Assisted in requesting refill for Januvia from patient assistance program today.  Patient states he has mailed back application for [id number] Januvia to [employer] but representative I spoke with today states they have not received yet. Representative states it might take 7 to 10 business days to show in their system.  Will continue to follow up. Could consider change to SGLT2 like Jardiance or Farxiga in place of Januvia since patient has CHF, CKD and DM (could also help with edema) Recommended checking blood glucose / sugar Vincent Consider less aggressive A1c goal of <7.5% given patient's age and CVD history.  Reviewed home blood glucose readings and reviewed goals  Fasting blood glucose goal (before meals) = 80 to 130 Blood glucose goal after a meal = less than 180    Chronic Heart Failure (HFrEF) / chronic venous insufficiency:  Stable; Goal: preventions of CHF exacerbations and minimize symptoms of CHF. Current Regimen:  Furosemide 20mg64mly if needed for weight gain of 3lbs on 24 hours or for shortness of breath Metoprolol succinate ER 25mg 91my Last EF = 40-45% (08/18/2020) Patient has scales at home but only checks weight 2 or 3 times per week. Last office visit weight was 239 lbs. Patients recent home weights have been 236 lbs (on 12/20 and 12/21) Denies shortness of breath or lower extremity edema Interventions:  Discussed importance of checking weight Vincent  Reviewed when to take as needed furosemide  Atrial fibrillation  Current regimen:  Eliquis 2.5mg tw68m Vincent in morning and with dinner Metoprolol succinate 25mg da37min morning Patient did reach coverage gap in 2022 but not 3% out of pocket spend to apply for [employer] patient assistance program for 2022. Patient states today he has enough Eliquis to last until benefits reset in January 2023.   Interventions: Maintain  current atrial fibrillation medication regimen Will monitor medication cost in 2023 and apply for patient assistance program for Eliquis if patient qualifies.    Decreased Kidney Function:  BMET Lab Results  Component Value Date   NA 136 11/30/2020   K 4.3 11/30/2020   CO2 23 11/30/2020   GLUCOSE 139 (H) 11/30/2020   BUN 43 (H) 11/30/2020   CREATININE 1.71 (H) 11/30/2020   CALCIUM 9.5 11/30/2020   GFRNONAA 42 (L) 09/13/2016   Current regimen:  none Interventions: Reviewed medications to see if any need adjustment based on current renal function.  Consider SGLT2 for CKD, CHF and DM Avoid over the counter pain relievers like Aleve / naproxen; Motrin / ibuprofen. If need for pain relief, headaches or fever - Tylenol / acetaminophen 541m up to 2 tablets every 8 hours is safer option.   Osteopenia:  Last DEXA 12/2019 Lowest T-Score = -2.4 at left femur neck ;  Right femur neck = -1.4 History of fracture foot and shoulder. Per patient sustained from fall from standing position  Fall Risk  01/01/2021 07/13/2020 10/06/2019 09/14/2019 02/10/2019  Falls in the past year? 0 0 0 0 0  Comment - - - - Emmi Telephone Survey: data to providers prior to load  Number falls in past yr: 0 0 0 - -  Injury with Fall? 0 0 0 - -  Comment - - - - -  Risk for fall due to : No Fall Risks History of fall(s);Impaired balance/gait - - -  Follow up Falls prevention discussed Falls prevention discussed;Falls evaluation completed Education provided;Falls prevention discussed - -  Interventions:  Continue to take calcium + D Vincent Discussed fall prevention Continue to use cane for ambulation assistance Consider recheck DEXA in 2023  Medication management Pharmacist Clinical Goal(s): Over the next 90 days, patient will work with PharmD and providers to achieve optimal medication adherence Current [facility] Interventions Comprehensive medication review performed. Continue  current medication management strategy Assisted with patient assistance program for Januvia  Please see past updates related to this goal by clicking on the "Past Updates" button in the selected goal      Medication Assistance: approved for patient assistance for Januvia in June 2022 through 03/17/2021. Patient has mailed application for [id number] is currently in process.  Patient did not meet 3% out of pocket spend for Eliquis patient assistance program in 2022 but will follow in 2023.    Patient's preferred pharmacy is:  [id number] [place], [place] - [address] [place]  [id number]: [phone]: [phone]  Follow Up:  Patient agrees to Care Plan and Follow-up.    Telephone follow up appointment with care management team member scheduled for:  2 months  TaCherre RobinsPharmD Clinical Pharmacist [facility]

## 2021-03-17 DIAGNOSIS — E785 Hyperlipidemia, unspecified: Secondary | ICD-10-CM | POA: Diagnosis not present

## 2021-03-17 DIAGNOSIS — N183 Chronic kidney disease, stage 3 unspecified: Secondary | ICD-10-CM | POA: Diagnosis not present

## 2021-03-17 DIAGNOSIS — E0822 Diabetes mellitus due to underlying condition with diabetic chronic kidney disease: Secondary | ICD-10-CM | POA: Diagnosis not present

## 2021-03-17 DIAGNOSIS — I10 Essential (primary) hypertension: Secondary | ICD-10-CM | POA: Diagnosis not present

## 2021-03-17 DIAGNOSIS — I4819 Other persistent atrial fibrillation: Secondary | ICD-10-CM | POA: Diagnosis not present

## 2021-03-20 ENCOUNTER — Telehealth: Payer: Self-pay | Admitting: Family Medicine

## 2021-03-20 DIAGNOSIS — Z7901 Long term (current) use of anticoagulants: Secondary | ICD-10-CM | POA: Diagnosis not present

## 2021-03-20 DIAGNOSIS — E11649 Type 2 diabetes mellitus with hypoglycemia without coma: Secondary | ICD-10-CM | POA: Diagnosis not present

## 2021-03-20 DIAGNOSIS — E1122 Type 2 diabetes mellitus with diabetic chronic kidney disease: Secondary | ICD-10-CM | POA: Diagnosis not present

## 2021-03-20 DIAGNOSIS — E1165 Type 2 diabetes mellitus with hyperglycemia: Secondary | ICD-10-CM | POA: Diagnosis not present

## 2021-03-20 DIAGNOSIS — I4891 Unspecified atrial fibrillation: Secondary | ICD-10-CM | POA: Diagnosis not present

## 2021-03-20 DIAGNOSIS — Z7984 Long term (current) use of oral hypoglycemic drugs: Secondary | ICD-10-CM | POA: Diagnosis not present

## 2021-03-20 DIAGNOSIS — D692 Other nonthrombocytopenic purpura: Secondary | ICD-10-CM | POA: Diagnosis not present

## 2021-03-20 DIAGNOSIS — N1832 Chronic kidney disease, stage 3b: Secondary | ICD-10-CM | POA: Diagnosis not present

## 2021-03-20 NOTE — Telephone Encounter (Signed)
Vincent Allen is calling from [facility] stating she was at her visit with the patient and the patient's son stated concerns regarding his dad's ability of getting out of bed and walking since he gets very tired after walking 50-70 feet. She would like for PT and OT to be ordered for the patient to help him find a safer way to get out of bed and also OT to help him strengthen. Please advice.    Call back number : [phone]

## 2021-03-21 NOTE — Telephone Encounter (Signed)
Patient was last seen 01/25/21.  Are you ok with order?

## 2021-03-23 NOTE — Telephone Encounter (Signed)
Lvm to call back

## 2021-03-26 NOTE — Telephone Encounter (Signed)
Verbal done 

## 2021-03-28 ENCOUNTER — Ambulatory Visit (INDEPENDENT_AMBULATORY_CARE_PROVIDER_SITE_OTHER): Payer: PPO

## 2021-03-28 DIAGNOSIS — I495 Sick sinus syndrome: Secondary | ICD-10-CM | POA: Diagnosis not present

## 2021-03-28 LAB — CUP PACEART REMOTE DEVICE CHECK
Battery Remaining Longevity: 80 mo
Battery Voltage: 2.97 V
Brady Statistic RA Percent Paced: 2.15 %
Brady Statistic RV Percent Paced: 99.75 %
Date Time Interrogation Session: 20230111020539
Implantable Lead Implant Date: 20180706
Implantable Lead Implant Date: 20180706
Implantable Lead Location: 753859
Implantable Lead Location: 753860
Implantable Lead Model: 5076
Implantable Lead Model: 5076
Implantable Pulse Generator Implant Date: 20180706
Lead Channel Impedance Value: 285 Ohm
Lead Channel Impedance Value: 342 Ohm
Lead Channel Impedance Value: 361 Ohm
Lead Channel Impedance Value: 380 Ohm
Lead Channel Pacing Threshold Amplitude: 0.75 V
Lead Channel Pacing Threshold Pulse Width: 0.4 ms
Lead Channel Sensing Intrinsic Amplitude: 0.625 mV
Lead Channel Sensing Intrinsic Amplitude: 0.625 mV
Lead Channel Sensing Intrinsic Amplitude: 3.5 mV
Lead Channel Sensing Intrinsic Amplitude: 3.5 mV
Lead Channel Setting Pacing Amplitude: 2 V
Lead Channel Setting Pacing Amplitude: 2.5 V
Lead Channel Setting Pacing Pulse Width: 0.4 ms
Lead Channel Setting Sensing Sensitivity: 2.8 mV

## 2021-04-02 ENCOUNTER — Encounter: Payer: Self-pay | Admitting: Family Medicine

## 2021-04-02 ENCOUNTER — Ambulatory Visit (HOSPITAL_BASED_OUTPATIENT_CLINIC_OR_DEPARTMENT_OTHER)
Admission: RE | Admit: 2021-04-02 | Discharge: 2021-04-02 | Disposition: A | Discharge status: Home / Self Care | Payer: PPO | Source: Ambulatory Visit | Attending: Family Medicine | Admitting: Family Medicine

## 2021-04-02 ENCOUNTER — Ambulatory Visit (INDEPENDENT_AMBULATORY_CARE_PROVIDER_SITE_OTHER): Payer: PPO | Admitting: Family Medicine

## 2021-04-02 ENCOUNTER — Other Ambulatory Visit: Payer: Self-pay

## 2021-04-02 VITALS — BP 122/66 | HR 62 | Temp 98.3°F | Resp 16

## 2021-04-02 DIAGNOSIS — E785 Hyperlipidemia, unspecified: Secondary | ICD-10-CM

## 2021-04-02 DIAGNOSIS — E0822 Diabetes mellitus due to underlying condition with diabetic chronic kidney disease: Secondary | ICD-10-CM | POA: Diagnosis not present

## 2021-04-02 DIAGNOSIS — E559 Vitamin D deficiency, unspecified: Secondary | ICD-10-CM

## 2021-04-02 DIAGNOSIS — T148XXA Other injury of unspecified body region, initial encounter: Secondary | ICD-10-CM | POA: Insufficient documentation

## 2021-04-02 DIAGNOSIS — I10 Essential (primary) hypertension: Secondary | ICD-10-CM

## 2021-04-02 DIAGNOSIS — R059 Cough, unspecified: Secondary | ICD-10-CM | POA: Diagnosis not present

## 2021-04-02 DIAGNOSIS — E441 Mild protein-calorie malnutrition: Secondary | ICD-10-CM

## 2021-04-02 DIAGNOSIS — N183 Chronic kidney disease, stage 3 unspecified: Secondary | ICD-10-CM | POA: Diagnosis not present

## 2021-04-02 DIAGNOSIS — R109 Unspecified abdominal pain: Secondary | ICD-10-CM | POA: Diagnosis not present

## 2021-04-02 DIAGNOSIS — R0602 Shortness of breath: Secondary | ICD-10-CM | POA: Diagnosis not present

## 2021-04-02 DIAGNOSIS — I119 Hypertensive heart disease without heart failure: Secondary | ICD-10-CM

## 2021-04-02 DIAGNOSIS — I5032 Chronic diastolic (congestive) heart failure: Secondary | ICD-10-CM

## 2021-04-02 DIAGNOSIS — S300XXA Contusion of lower back and pelvis, initial encounter: Secondary | ICD-10-CM

## 2021-04-02 DIAGNOSIS — T1490XA Injury, unspecified, initial encounter: Secondary | ICD-10-CM

## 2021-04-02 LAB — POC URINALSYSI DIPSTICK (AUTOMATED)
Bilirubin, UA: NEGATIVE
Blood, UA: NEGATIVE
Glucose, UA: NEGATIVE
Ketones, UA: NEGATIVE
Leukocytes, UA: NEGATIVE
Nitrite, UA: NEGATIVE
Protein, UA: POSITIVE — AB
Spec Grav, UA: 1.015 (ref 1.010–1.025)
Urobilinogen, UA: 0.2 E.U./dL
pH, UA: 6 (ref 5.0–8.0)

## 2021-04-02 MED ORDER — GLIPIZIDE 10 MG PO TABS: 5.0000 mg | ORAL_TABLET | Freq: Two times a day (BID) | ORAL | 1 refills | Status: DC

## 2021-04-02 NOTE — Assessment & Plan Note (Signed)
Hydrate and monitor 

## 2021-04-02 NOTE — Assessment & Plan Note (Signed)
Well controlled, no changes to meds. Encouraged heart healthy diet such as the DASH diet and exercise as tolerated.  °

## 2021-04-02 NOTE — Assessment & Plan Note (Signed)
Family notes a fairly recent 10# weight gain, check cmp and if renal functions have not risen can consider increasing diuretics.

## 2021-04-02 NOTE — Progress Notes (Signed)
Subjective:    Patient ID: Vincent Allen, male    DOB: [birth year], 88 y.o.   MRN: [id number]  No chief complaint on file.   HPI Patient is in today for follow up on chronic medical concerns and he is accompanied by his children. He is struggling with worsening weakness, sob and weight gain. The worst of his decline started about 2 weeks ago after a fall in his home resulted in a back injury/contusion. He now has extensive bruising but they do not believe he broke any bones. He notes a recent increase in sob and a cough as well but no fevers or chills. He was struggling with abdominal pain but that has improved. His bowels and urine are moving normally. He has a low appetite but they are trying to get protein in him several times a day. They note he recently had a low sugar of 68 and his highest sugar this month was 171. Sugars generally in 130s and 140s. No c/o polyuria or polydipsia.   Past Medical History:  Diagnosis Date   Arthritis    Bradycardia    Cataract    Chronic kidney disease stage III (GFR 30-59 ml/min) 05/03/2012   Colon polyps    Complication of anesthesia    emesis   Diabetes mellitus    GERD (gastroesophageal reflux disease)    HOH (hard of hearing)    HTN (hypertension) 01/13/2013   Hyperlipidemia    Hypertension    Pacemaker 12/2016   Persistent atrial fibrillation ([place])    Presence of permanent cardiac pacemaker 09/20/2016   Symptomatic bradycardia    Vitamin D deficiency 07/17/2015    Past Surgical History:  Procedure Laterality Date   ANKLE ARTHROPLASTY     CARDIOVERSION N/A 08/20/2016   Procedure: CARDIOVERSION;  Surgeon: Sanda Klein, MD;  Location: [facility];  Service: Cardiovascular;  Laterality: N/A;   CATARACT EXTRACTION  2010, 2014   CATARACT EXTRACTION  02/28/14   CHOLECYSTECTOMY     CHOLECYSTECTOMY N/A 05/03/2012   Procedure: LAPAROSCOPIC CHOLECYSTECTOMY WITH INTRAOPERATIVE CHOLANGIOGRAM;  Surgeon: [name], MD;  Location: [facility];   Service: General;  Laterality: N/A;   INSERT / REPLACE / REMOVE PACEMAKER  09/20/2016   PACEMAKER IMPLANT N/A 09/20/2016   Procedure: Pacemaker Implant;  Surgeon: Deboraha Sprang, MD;  Location: [facility];  Service: Cardiovascular;  Laterality: N/A;   ROTATOR CUFF REPAIR     SHOULDER SURGERY     TONSILLECTOMY      Family History  Problem Relation Age of Onset   Cancer Mother        colon, breast, pancreas, skin cancer   Heart disease Father        heart valve replaced   Stroke Father    Diabetes Father    Cancer Paternal Grandmother        colon   Obesity Son    Heart disease Son        bradycardia    Social History   Socioeconomic History   Marital status: Widowed    Spouse name: Not on file   Number of children: Not on file   Years of education: Not on file   Highest education level: Not on file  Occupational History   Not on file  Tobacco Use   Smoking status: Never   Smokeless tobacco: Never  Vaping Use   Vaping Use: Never used  Substance and Sexual Activity   Alcohol use: Yes    Alcohol/week:  1.0 standard drink    Types: 1 Glasses of wine per week   Drug use: No   Sexual activity: Not on file    Comment: lives alone , no major dietary restrictions, 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.  Other Topics Concern   Not on file  Social History Narrative   Not on file   Social Determinants of Health   Financial Resource Strain: Medium Risk   Difficulty of Paying Living Expenses: Somewhat hard  Food Insecurity: No Food Insecurity   Worried About Running Out of Food in the Last Year: Never true   Ran Out of Food in the Last Year: Never true  Transportation Needs: No Transportation Needs   Lack of Transportation (Medical): No   Lack of Transportation (Non-Medical): No  Physical Activity: Insufficiently Active   Days of Exercise per Week: 7 days   Minutes of Exercise per Session: 10 min  Stress: No Stress Concern Present   Feeling  of Stress : Not at all  Social Connections: Socially Isolated   Frequency of Communication with Friends and Family: More than three times a week   Frequency of Social Gatherings with Friends and Family: Three times a week   Attends Religious Services: Never   Active Member of Clubs or Organizations: No   Attends Archivist Meetings: Never   Marital Status: Widowed  Human resources officer Violence: Not At Risk   Fear of Current or Ex-Partner: No   Emotionally Abused: No   Physically Abused: No   Sexually Abused: No    Outpatient Medications Prior to Visit  Medication Sig Dispense Refill   amLODipine (NORVASC) 5 MG tablet TAKE 1 TABLET BY MOUTH EVERY DAY 90 tablet 1   Calcium 600 MG tablet Take 1 tablet (600 mg total) by mouth 2 (two) times daily. 60 tablet 0   ELIQUIS 2.5 MG TABS tablet TAKE 1 TABLET BY MOUTH TWICE A DAY 60 tablet 5   finasteride (PROSCAR) 5 MG tablet Take 5 mg by mouth daily.     furosemide (LASIX) 20 MG tablet TAKE 1-2 TABLETS BY MOUTH DAILY AS NEEDED FOR EDEMA (WEIGHT GAIN >3LBS IN 24 HRS, SOB). 180 tablet 1   glucose blood (ONE TOUCH ULTRA TEST) test strip USE 1 STRIP 2 TIMES DAILY TO CHECK BLOOD SUGAR DX E08.22, N18.3 300 each 1   JANUVIA 50 MG tablet TAKE 1 TABLET BY MOUTH EVERY DAY 30 tablet 3   Krill Oil 1000 MG CAPS Take 1 capsule by mouth daily.     metoprolol succinate (TOPROL-XL) 25 MG 24 hr tablet TAKE 1 TABLET BY MOUTH EVERY DAY 90 tablet 1   mupirocin ointment (BACTROBAN) 2 % Apply topically daily. (Patient not taking: Reported on 03/07/2021)     silodosin (RAPAFLO) 4 MG CAPS capsule Take 4 mg by mouth daily.     triamcinolone cream (KENALOG) 0.1 % APPLY TO AFFECTED AREA TWICE A DAY 60 g 1   triamterene-hydrochlorothiazide (MAXZIDE) 75-50 MG tablet TAKE 1 TABLET BY MOUTH EVERY DAY 90 tablet 1   Vitamin D, Cholecalciferol, 25 MCG (1000 UT) CAPS Take 1 capsule by mouth daily.     Zinc 50 MG TABS Take 1 tablet by mouth daily.     glipiZIDE (GLUCOTROL)  10 MG tablet TAKE 2 TABLETS (20 MG TOTAL) BY MOUTH 2 (TWO) TIMES DAILY BEFORE A MEAL. 360 tablet 1   No facility-administered medications prior to visit.    Allergies  Allergen Reactions  Atorvastatin     Myalgias / leg pain and weakness   Penicillins Hives and Itching    Has patient had a PCN reaction causing immediate rash, facial/tongue/throat swelling, SOB or lightheadedness with hypotension:Yes Has patient had a PCN reaction causing severe rash involving mucus membranes or skin necrosis:No Has patient had a PCN reaction that required hospitalization:No Has patient had a PCN reaction occurring within the last 10 years:No If all of the above answers are "NO", then may proceed with Cephalosporin use.    Verapamil     Junctional rhythm   Influenza Vaccines Rash    tinnitus    Review of Systems  Constitutional:  Positive for malaise/fatigue. Negative for fever.  HENT:  Negative for congestion.   Eyes:  Negative for blurred vision.  Respiratory:  Positive for shortness of breath.   Cardiovascular:  Negative for chest pain, palpitations and leg swelling.  Gastrointestinal:  Positive for abdominal pain. Negative for blood in stool and nausea.  Genitourinary:  Negative for dysuria and frequency.  Musculoskeletal:  Positive for back pain and falls.  Skin:  Negative for rash.  Neurological:  Positive for weakness. Negative for dizziness, loss of consciousness and headaches.  Endo/Heme/Allergies:  Negative for environmental allergies.  Psychiatric/Behavioral:  Negative for depression. The patient is not nervous/anxious.       Objective:    Physical Exam Constitutional:      General: He is not in acute distress.    Appearance: Normal appearance. He is obese. He is not ill-appearing or toxic-appearing.  HENT:     Head: Normocephalic and atraumatic.     Right Ear: External ear normal.     Left Ear: External ear normal.     Nose: Nose normal.  Eyes:     General:        Right  eye: No discharge.        Left eye: No discharge.     Comments: Conjunctivae pale pink  Cardiovascular:     Rate and Rhythm: Normal rate.     Heart sounds: Murmur heard.  Pulmonary:     Effort: Pulmonary effort is normal.     Breath sounds: Normal breath sounds.  Skin:    Findings: No rash.  Neurological:     Mental Status: He is alert and oriented to person, place, and time.  Psychiatric:        Behavior: Behavior normal.    BP 122/66    Pulse 62    Temp 98.3 F (36.8 C)    Resp 16    SpO2 98%  Wt Readings from Last 3 Encounters:  01/25/21 237 lb (107.5 kg)  01/01/21 234 lb (106.1 kg)  11/30/20 228 lb 3.2 oz (103.5 kg)    Diabetic Foot Exam - Simple   No data filed    Lab Results  Component Value Date   WBC 6.7 11/30/2020   HGB 13.8 11/30/2020   HCT 43.0 11/30/2020   PLT 142.0 (L) 11/30/2020   GLUCOSE 139 (H) 11/30/2020   CHOL 141 11/30/2020   TRIG 90.0 11/30/2020   HDL 36.70 (L) 11/30/2020   LDLDIRECT 65.0 11/03/2018   LDLCALC 86 11/30/2020   ALT 16 11/30/2020   AST 23 11/30/2020   NA 136 11/30/2020   K 4.3 11/30/2020   CL 101 11/30/2020   CREATININE 1.71 (H) 11/30/2020   BUN 43 (H) 11/30/2020   CO2 23 11/30/2020   TSH 4.55 11/30/2020   PSA 1.18 10/07/2012   INR 1.0 08/15/2016  HGBA1C 6.3 11/30/2020   MICROALBUR 26.7 (H) 07/15/2016    Lab Results  Component Value Date   TSH 4.55 11/30/2020   Lab Results  Component Value Date   WBC 6.7 11/30/2020   HGB 13.8 11/30/2020   HCT 43.0 11/30/2020   MCV 85.8 11/30/2020   PLT 142.0 (L) 11/30/2020   Lab Results  Component Value Date   NA 136 11/30/2020   K 4.3 11/30/2020   CO2 23 11/30/2020   GLUCOSE 139 (H) 11/30/2020   BUN 43 (H) 11/30/2020   CREATININE 1.71 (H) 11/30/2020   BILITOT 2.0 (H) 11/30/2020   ALKPHOS 125 (H) 11/30/2020   AST 23 11/30/2020   ALT 16 11/30/2020   PROT 6.9 11/30/2020   ALBUMIN 3.7 11/30/2020   CALCIUM 9.5 11/30/2020   ANIONGAP 8 07/10/2016   GFR 36.09 (L) 11/30/2020    Lab Results  Component Value Date   CHOL 141 11/30/2020   Lab Results  Component Value Date   HDL 36.70 (L) 11/30/2020   Lab Results  Component Value Date   LDLCALC 86 11/30/2020   Lab Results  Component Value Date   TRIG 90.0 11/30/2020   Lab Results  Component Value Date   CHOLHDL 4 11/30/2020   Lab Results  Component Value Date   HGBA1C 6.3 11/30/2020       Assessment & Plan:   Problem List Items Addressed This Visit     Diabetes mellitus with chronic kidney disease ([place]) (Chronic)    hgba1c acceptable, minimize simple carbs. Increase exercise as tolerated. Continue current meds except drop the Glipizide to 5 mg bid and monitor      Relevant Medications   glipiZIDE (GLUCOTROL) 10 MG tablet   Other Relevant Orders   Hemoglobin A1c   Hypertensive heart disease (Chronic)    Well controlled, no changes to meds. Encouraged heart healthy diet such as the DASH diet and exercise as tolerated.       Hyperlipidemia LDL goal <70 (Chronic)   Relevant Orders   CBC with Differential/Platelet   Comprehensive metabolic panel   Lipid panel   TSH   Chronic kidney disease stage III (GFR 30-59 ml/min) (Chronic)    Hydrate and monitor      Essential hypertension - Primary   Relevant Orders   CBC with Differential/Platelet   Comprehensive metabolic panel   Lipid panel   TSH   Vitamin D deficiency    Supplement and monitor      Chronic diastolic CHF (congestive heart failure) ([place])    Family notes a fairly recent 10# weight gain, check cmp and if renal functions have not risen can consider increasing diuretics.       Morbid obesity ([place])    Encouraged DASH or MIND diet, decrease po intake and increase exercise as tolerated. Needs 7-8 hours of sleep nightly. Avoid trans fats, eat small, frequent meals every 4-5 hours with lean proteins, complex carbs and healthy fats. Minimize simple carbs, high fat foods and processed foods      Relevant Medications   glipiZIDE  (GLUCOTROL) 10 MG tablet   Contusion    Fell onto his back roughly 2 weeks ago in his bathroom and has been in increased pain, less mobile and more frail since then. Bruising is extensive will check labs today including CK and cmp.patient encouraged to hydrate well.      SOB (shortness of breath)    And cough, check CXR today      Relevant Orders  DG Chest 2 View (Completed)   Ambulatory referral to Pulmonology   Other Visit Diagnoses     Trauma       Relevant Orders   CK (Creatine Kinase)   Malnutrition of mild degree (HCC)       Relevant Orders   Prealbumin   Abdominal pain, unspecified abdominal location       Relevant Orders   Urine Culture   POCT Urinalysis Dipstick (Automated) (Completed)   Cough, unspecified type       Relevant Orders   DG Chest 2 View (Completed)   Ambulatory referral to Pulmonology       I have changed Ryatt C. Diloreto "Bob"'s glipiZIDE. I am also having him maintain his calcium carbonate, glucose blood, Vitamin D (Cholecalciferol), Zinc, Krill Oil, Eliquis, Januvia, triamterene-hydrochlorothiazide, mupirocin ointment, amLODipine, metoprolol succinate, triamcinolone cream, furosemide, finasteride, and silodosin.  Meds ordered this encounter  Medications   glipiZIDE (GLUCOTROL) 10 MG tablet    Sig: Take 0.5 tablets (5 mg total) by mouth 2 (two) times daily before a meal.    Dispense:  360 tablet    Refill:  1     [name], MD

## 2021-04-02 NOTE — Assessment & Plan Note (Signed)
And cough, check CXR today

## 2021-04-02 NOTE — Assessment & Plan Note (Signed)
Fell onto his back roughly 2 weeks ago in his bathroom and has been in increased pain, less mobile and more frail since then. Bruising is extensive will check labs today including CK and cmp.patient encouraged to hydrate well.

## 2021-04-02 NOTE — Assessment & Plan Note (Signed)
Supplement and monitor 

## 2021-04-02 NOTE — Assessment & Plan Note (Signed)
Encouraged DASH or MIND diet, decrease po intake and increase exercise as tolerated. Needs 7-8 hours of sleep nightly. Avoid trans fats, eat small, frequent meals every 4-5 hours with lean proteins, complex carbs and healthy fats. Minimize simple carbs, high fat foods and processed foods 

## 2021-04-02 NOTE — Patient Instructions (Signed)
Stasis Dermatitis Stasis dermatitis is a long-term (chronic) skin condition that happens when veins can no longer pump blood back to the heart (poor circulation). This condition causes a red or brown scaly rash or sores (ulcers) from the pooling of blood (stasis). This condition usually affects the lower legs. It may affect one leg or both legs. Without treatment, severe stasis dermatitis can lead to other skin conditions and infections. What are the causes? This condition is caused by poor circulation. What increases the risk? You are more likely to develop this condition if: You are not very active. You stand for long periods of time. You have veins that have become enlarged and twisted (varicose veins). You have leg veins that are not strong enough to send blood back to the heart (venous insufficiency). You have had a blood clot. You have been pregnant many times. You have had vein surgery. You are obese. You have heart or kidney failure. You are 86 years of age or older. You have had injuries to your legs in the past. What are the signs or symptoms? Common early symptoms of this condition include: Itchiness in one or both of your legs. Swelling in your ankle or leg. This might get better overnight but be worse again during the day. Skin that looks thin on your ankle and leg. Red or brown marks that develop slowly. Skin that is dry, cracked, or easily irritated. Red, swollen skin that is sore or has a burning feeling. An achy or heavy feeling after you walk or stand for long periods of time. Pain. Later and more severe symptoms of this condition include: Skin that looks shiny. Small, open sores (ulcers). These are often red or purple and leak fluid. Skin that feels hard. Severe itching. A change in the shape or color of your lower legs. Severe pain. Difficulty walking. How is this diagnosed? This condition may be diagnosed based on: Your symptoms and medical history. A  physical exam. You may also have tests, including: Blood tests. Imaging tests to check blood flow (Doppler ultrasound). Allergy tests. You may need to see a health care provider who specializes in skin diseases (dermatologist). How is this treated? This condition may be treated with: Compression stockings or an elastic wrap to improve circulation. Medicines, such as: Corticosteroid creams and ointments. Non-corticosteroid medicines applied to the skin (topical). Medicine to reduce swelling in the legs (diuretics). Antibiotics. Medicine to relieve itching (antihistamines). A bandage (dressing). A wrap that contains zinc and gelatin (Unna boot). Follow these instructions at home: Skin care Moisturize your skin as told by your health care provider. Do not use moisturizers with fragrance. This can irritate your skin. Apply a cool, wet cloth (cool compress) to the affected areas. Do not scratch your skin. Do not rub your skin dry after a bath or shower. Gently pat your skin dry. Do not use scented soaps, detergents, or perfumes. Medicines Take or use over-the-counter and prescription medicines only as told by your health care provider. If you were prescribed an antibiotic medicine, take or use it as told by your health care provider. Do not stop taking or using the antibiotic even if your condition improves. Activity Walk as told by your health care provider. Walking increases blood flow. Do calf and ankle exercises throughout the day as told by your health care provider. This will help increase blood flow. Raise (elevate) your legs above the level of your heart when you are sitting or lying down. Lifestyle Work with your health  care provider to lose weight, if needed. Do not cross your legs when you sit. Do not stand or sit in one position for long periods of time. Wear comfortable, loose-fitting clothing. Circulation in your legs will be worse if you wear tight pants, belts, and  waistbands. Do not use any products that contain nicotine or tobacco, such as cigarettes, e-cigarettes, and chewing tobacco. If you need help quitting, ask your health care provider. General instructions If you were asked to use one of the following to help with your condition, follow instructions from your health care provider on how to: Remove and change any dressing. Wear compression stockings. These stockings help to prevent blood clots and reduce swelling in your legs. Wear the [employer]. Keep all follow-up visits as told by your health care provider. This is important. Contact a health care provider if: Your condition does not improve with treatment. Your condition gets worse. You have signs of infection in the affected area. Watch for: Swelling. Tenderness. Redness. Soreness. Warmth. You have a fever. Get help right away if: You notice red streaks coming from the affected area. Your bone or joint underneath the affected area becomes painful after the skin has healed. The affected area turns darker. You feel a deep pain in your leg or groin. You are short of breath. Summary Stasis dermatitis is a long-term (chronic) skin condition that happens when veins can no longer pump blood back to the heart (poor circulation). Wear compression stockings as told by your health care provider. These stockings help to prevent blood clots and reduce swelling in your legs. Follow instructions from your health care provider about activity, medicines, and lifestyle. Contact a health care provider if you have a fever or have signs of infection in the affected area. Keep all follow-up visits as told by your health care provider. This is important. This information is not intended to replace advice given to you by your health care provider. Make sure you discuss any questions you have with your health care provider. Document Revised: 05/15/2020 Document Reviewed: 05/15/2020 Elsevier Patient Education   2022 [place]. Chronic Venous Insufficiency Chronic venous insufficiency is a condition where the leg veins cannot effectively pump blood from the legs to the heart. This happens when the vein walls are either stretched, weakened, or damaged, or when the valves inside the vein are damaged. With the right treatment, you should be able to continue with an active life. This condition is also called venous stasis. What are the causes? Common causes of this condition include: High blood pressure inside the veins (venous hypertension). Sitting or standing too long, causing increased blood pressure in the leg veins. A blood clot that blocks blood flow in a vein (deep vein thrombosis, DVT). Inflammation of a vein (phlebitis) that causes a blood clot to form. Tumors in the pelvis that cause blood to back up. What increases the risk? The following factors may make you more likely to develop this condition: Having a family history of this condition. Obesity. Pregnancy. Living without enough regular physical activity or exercise (sedentary lifestyle). Smoking. Having a job that requires long periods of standing or sitting in one place. Being a certain age. Women in their 22s and 69s and men in their 28s are more likely to develop this condition. What are the signs or symptoms? Symptoms of this condition include: Veins that are enlarged, bulging, or twisted (varicose veins). Skin breakdown or ulcers. Reddened skin or dark discoloration of skin on the leg  between the knee and ankle. Brown, smooth, tight, and painful skin just above the ankle, usually on the inside of the leg (lipodermatosclerosis). Swelling of the legs. How is this diagnosed? This condition may be diagnosed based on: Your medical history. A physical exam. Tests, such as: A procedure that creates an image of a blood vessel and nearby organs and provides information about blood flow through the blood vessel (duplex  ultrasound). A procedure that tests blood flow (plethysmography). A procedure that looks at the veins using X-ray and dye (venogram). How is this treated? The goals of treatment are to help you return to an active life and to minimize pain or disability. Treatment depends on the severity of your condition, and it may include: Wearing compression stockings. These can help relieve symptoms and help prevent your condition from getting worse. However, they do not cure the condition. Sclerotherapy. This procedure involves an injection of a solution that shrinks damaged veins. Surgery. This may involve: Removing a diseased vein (vein stripping). Cutting off blood flow through the vein (laser ablation surgery). Repairing or reconstructing a valve within the affected vein. Follow these instructions at home:   Wear compression stockings as told by your health care provider. These stockings help to prevent blood clots and reduce swelling in your legs. Take over-the-counter and prescription medicines only as told by your health care provider. Stay active by exercising, walking, or doing different activities. Ask your health care provider what activities are safe for you and how much exercise you need. Drink enough fluid to keep your urine pale yellow. Do not use any products that contain nicotine or tobacco, such as cigarettes, e-cigarettes, and chewing tobacco. If you need help quitting, ask your health care provider. Keep all follow-up visits as told by your health care provider. This is important. Contact a health care provider if you: Have redness, swelling, or more pain in the affected area. See a red streak or line that goes up or down from the affected area. Have skin breakdown or skin loss in the affected area, even if the breakdown is small. Get an injury in the affected area. Get help right away if: You get an injury and an open wound in the affected area. You have: Severe pain that does  not get better with medicine. Sudden numbness or weakness in the foot or ankle below the affected area. Trouble moving your foot or ankle. A fever. Worse or persistent symptoms. Chest pain. Shortness of breath. Summary Chronic venous insufficiency is a condition where the leg veins cannot effectively pump blood from the legs to the heart. Chronic venous insufficiency occurs when the vein walls become stretched, weakened, or damaged, or when valves within the vein are damaged. Treatment depends on how severe your condition is. It often involves wearing compression stockings and may involve having a procedure. Make sure you stay active by exercising, walking, or doing different activities. Ask your health care provider what activities are safe for you and how much exercise you need. This information is not intended to replace advice given to you by your health care provider. Make sure you discuss any questions you have with your health care provider. Document Revised: 05/16/2020 Document Reviewed: 05/16/2020 Elsevier Patient Education  [place].

## 2021-04-02 NOTE — Assessment & Plan Note (Addendum)
hgba1c acceptable, minimize simple carbs. Increase exercise as tolerated. Continue current meds except drop the Glipizide to 5 mg bid and monitor

## 2021-04-03 ENCOUNTER — Other Ambulatory Visit: Payer: Self-pay | Admitting: Family Medicine

## 2021-04-03 DIAGNOSIS — E46 Unspecified protein-calorie malnutrition: Secondary | ICD-10-CM

## 2021-04-03 DIAGNOSIS — N289 Disorder of kidney and ureter, unspecified: Secondary | ICD-10-CM

## 2021-04-03 LAB — LIPID PANEL
Cholesterol: 125 mg/dL (ref 0–200)
HDL: 31.3 mg/dL — ABNORMAL LOW (ref >39.00)
LDL Cholesterol: 77 mg/dL (ref 0–99)
NonHDL: 93.62
Total CHOL/HDL Ratio: 4
Triglycerides: 84 mg/dL (ref 0.0–149.0)
VLDL: 16.8 mg/dL (ref 0.0–40.0)

## 2021-04-03 LAB — CBC WITH DIFFERENTIAL/PLATELET
Basophils Absolute: 0.1 10*3/uL (ref 0.0–0.1)
Basophils Relative: 0.7 % (ref 0.0–3.0)
Eosinophils Absolute: 0.1 10*3/uL (ref 0.0–0.7)
Eosinophils Relative: 0.7 % (ref 0.0–5.0)
HCT: 34.6 % — ABNORMAL LOW (ref 39.0–52.0)
Hemoglobin: 11.1 g/dL — ABNORMAL LOW (ref 13.0–17.0)
Lymphocytes Relative: 8.3 % — ABNORMAL LOW (ref 12.0–46.0)
Lymphs Abs: 0.8 10*3/uL (ref 0.7–4.0)
MCHC: 32 g/dL (ref 30.0–36.0)
MCV: 86.2 fl (ref 78.0–100.0)
Monocytes Absolute: 1.3 10*3/uL — ABNORMAL HIGH (ref 0.1–1.0)
Monocytes Relative: 13.3 % — ABNORMAL HIGH (ref 3.0–12.0)
Neutro Abs: 7.7 10*3/uL (ref 1.4–7.7)
Neutrophils Relative %: 77 % (ref 43.0–77.0)
Platelets: 229 10*3/uL (ref 150.0–400.0)
RBC: 4.01 Mil/uL — ABNORMAL LOW (ref 4.22–5.81)
RDW: 14.9 % (ref 11.5–15.5)
WBC: 10 10*3/uL (ref 4.0–10.5)

## 2021-04-03 LAB — COMPREHENSIVE METABOLIC PANEL
ALT: 16 U/L (ref 0–53)
AST: 28 U/L (ref 0–37)
Albumin: 3.6 g/dL (ref 3.5–5.2)
Alkaline Phosphatase: 77 U/L (ref 39–117)
BUN: 74 mg/dL — ABNORMAL HIGH (ref 6–23)
CO2: 25 mEq/L (ref 19–32)
Calcium: 8.9 mg/dL (ref 8.4–10.5)
Chloride: 96 mEq/L (ref 96–112)
Creatinine, Ser: 2.17 mg/dL — ABNORMAL HIGH (ref 0.40–1.50)
GFR: 27.05 mL/min — ABNORMAL LOW (ref >60.00)
Glucose, Bld: 59 mg/dL — ABNORMAL LOW (ref 70–99)
Potassium: 3.6 mEq/L (ref 3.5–5.1)
Sodium: 134 mEq/L — ABNORMAL LOW (ref 135–145)
Total Bilirubin: 1.8 mg/dL — ABNORMAL HIGH (ref 0.2–1.2)
Total Protein: 6.9 g/dL (ref 6.0–8.3)

## 2021-04-03 LAB — PREALBUMIN: Prealbumin: 10 mg/dL — ABNORMAL LOW (ref 21–43)

## 2021-04-03 LAB — URINE CULTURE
MICRO NUMBER:: 12875336
Result:: NO GROWTH
SPECIMEN QUALITY:: ADEQUATE

## 2021-04-03 LAB — TSH: TSH: 4.58 u[IU]/mL (ref 0.35–5.50)

## 2021-04-03 LAB — CK: Total CK: 59 U/L (ref 7–232)

## 2021-04-03 LAB — HEMOGLOBIN A1C: Hgb A1c MFr Bld: 5.8 % (ref 4.6–6.5)

## 2021-04-03 NOTE — Progress Notes (Unsigned)
B

## 2021-04-04 ENCOUNTER — Other Ambulatory Visit: Payer: Self-pay | Admitting: *Deleted

## 2021-04-04 ENCOUNTER — Telehealth: Payer: Self-pay | Admitting: *Deleted

## 2021-04-04 DIAGNOSIS — N289 Disorder of kidney and ureter, unspecified: Secondary | ICD-10-CM

## 2021-04-04 DIAGNOSIS — D649 Anemia, unspecified: Secondary | ICD-10-CM

## 2021-04-04 DIAGNOSIS — E46 Unspecified protein-calorie malnutrition: Secondary | ICD-10-CM

## 2021-04-04 NOTE — Telephone Encounter (Signed)
Son Shanon Brow will try 1/2 half tablet once a day.

## 2021-04-04 NOTE — Telephone Encounter (Signed)
FYI Son Shanon Brow notified of results.  Patient is very weak to try and get in for [place] or Fri for lab work.  They will push for Monday.  They are working with patient and try to get him more protein and to eat.  He also stated that sugars have been running low like 66.  He stated that they have been giving him 1 tablet twice a day.  I advised that he is suppose to be on 1/2 tab twice a day.  They stated they will get it changed.  Advised to call if sugar levels drop.

## 2021-04-04 NOTE — Telephone Encounter (Signed)
-----   Message from [name], MD sent at 04/03/2021 12:54 PM EST ----- Notify labs note worsening kidney function so I have referred to nephrology. It is worse some because of his injury but his kidneys were already slowing down. He must try to drink fluids and I would like to not increase the lasix if we do not need to. Repeat CMP, cbc with diff and CK on Thursday or Friday this week if possible to monitor. Mild anemia noted due to injury, make sure he is taking multivitamin with minerals. Also his protein in his blood the pre Albumin is low so I have referred him to nutritionist and he should try and up protein intake as discussed in visit

## 2021-04-05 ENCOUNTER — Encounter: Payer: Self-pay | Admitting: Family Medicine

## 2021-04-05 DIAGNOSIS — E162 Hypoglycemia, unspecified: Secondary | ICD-10-CM | POA: Diagnosis not present

## 2021-04-05 DIAGNOSIS — Z7984 Long term (current) use of oral hypoglycemic drugs: Secondary | ICD-10-CM | POA: Diagnosis not present

## 2021-04-09 ENCOUNTER — Other Ambulatory Visit (INDEPENDENT_AMBULATORY_CARE_PROVIDER_SITE_OTHER): Payer: PPO

## 2021-04-09 DIAGNOSIS — E46 Unspecified protein-calorie malnutrition: Secondary | ICD-10-CM

## 2021-04-09 DIAGNOSIS — D649 Anemia, unspecified: Secondary | ICD-10-CM | POA: Diagnosis not present

## 2021-04-09 DIAGNOSIS — N289 Disorder of kidney and ureter, unspecified: Secondary | ICD-10-CM | POA: Diagnosis not present

## 2021-04-09 NOTE — Progress Notes (Signed)
Remote pacemaker transmission.   

## 2021-04-10 ENCOUNTER — Other Ambulatory Visit: Payer: Self-pay

## 2021-04-10 ENCOUNTER — Telehealth: Payer: Self-pay

## 2021-04-10 LAB — COMPREHENSIVE METABOLIC PANEL
ALT: 18 U/L (ref 0–53)
AST: 22 U/L (ref 0–37)
Albumin: 3.3 g/dL — ABNORMAL LOW (ref 3.5–5.2)
Alkaline Phosphatase: 84 U/L (ref 39–117)
BUN: 89 mg/dL (ref 6–23)
CO2: 25 mEq/L (ref 19–32)
Calcium: 9 mg/dL (ref 8.4–10.5)
Chloride: 96 mEq/L (ref 96–112)
Creatinine, Ser: 2.32 mg/dL — ABNORMAL HIGH (ref 0.40–1.50)
GFR: 24.96 mL/min — ABNORMAL LOW (ref >60.00)
Glucose, Bld: 149 mg/dL — ABNORMAL HIGH (ref 70–99)
Potassium: 3.5 mEq/L (ref 3.5–5.1)
Sodium: 133 mEq/L — ABNORMAL LOW (ref 135–145)
Total Bilirubin: 1.6 mg/dL — ABNORMAL HIGH (ref 0.2–1.2)
Total Protein: 6.5 g/dL (ref 6.0–8.3)

## 2021-04-10 LAB — CBC WITH DIFFERENTIAL/PLATELET
Basophils Absolute: 0.1 10*3/uL (ref 0.0–0.1)
Basophils Relative: 1.2 % (ref 0.0–3.0)
Eosinophils Absolute: 0.2 10*3/uL (ref 0.0–0.7)
Eosinophils Relative: 1.6 % (ref 0.0–5.0)
HCT: 33.6 % — ABNORMAL LOW (ref 39.0–52.0)
Hemoglobin: 10.8 g/dL — ABNORMAL LOW (ref 13.0–17.0)
Lymphocytes Relative: 8.6 % — ABNORMAL LOW (ref 12.0–46.0)
Lymphs Abs: 0.8 10*3/uL (ref 0.7–4.0)
MCHC: 32.2 g/dL (ref 30.0–36.0)
MCV: 86 fl (ref 78.0–100.0)
Monocytes Absolute: 1 10*3/uL (ref 0.1–1.0)
Monocytes Relative: 10.4 % (ref 3.0–12.0)
Neutro Abs: 7.3 10*3/uL (ref 1.4–7.7)
Neutrophils Relative %: 78.2 % — ABNORMAL HIGH (ref 43.0–77.0)
Platelets: 257 10*3/uL (ref 150.0–400.0)
RBC: 3.91 Mil/uL — ABNORMAL LOW (ref 4.22–5.81)
RDW: 14.8 % (ref 11.5–15.5)
WBC: 9.4 10*3/uL (ref 4.0–10.5)

## 2021-04-10 LAB — CK: Total CK: 46 U/L (ref 7–232)

## 2021-04-10 NOTE — Telephone Encounter (Signed)
CRITICAL VALUE STICKER  CRITICAL VALUE: BUN 89  RECEIVER (on-site recipient of call): Shaquitta Burbridge, RMA  DATE & TIME NOTIFIED: 04/10/2021 @ 11:13 am  MESSENGER (representative from lab): [place]   MD NOTIFIED: [name], MD   TIME OF NOTIFICATION: 11:22 am   RESPONSE:

## 2021-04-12 ENCOUNTER — Other Ambulatory Visit: Payer: Self-pay | Admitting: *Deleted

## 2021-04-12 DIAGNOSIS — N289 Disorder of kidney and ureter, unspecified: Secondary | ICD-10-CM

## 2021-04-13 DIAGNOSIS — N1832 Chronic kidney disease, stage 3b: Secondary | ICD-10-CM | POA: Diagnosis not present

## 2021-04-13 DIAGNOSIS — E162 Hypoglycemia, unspecified: Secondary | ICD-10-CM | POA: Diagnosis not present

## 2021-04-13 DIAGNOSIS — Z7984 Long term (current) use of oral hypoglycemic drugs: Secondary | ICD-10-CM | POA: Diagnosis not present

## 2021-04-16 ENCOUNTER — Ambulatory Visit (INDEPENDENT_AMBULATORY_CARE_PROVIDER_SITE_OTHER): Payer: PPO | Admitting: Pharmacist

## 2021-04-16 DIAGNOSIS — I10 Essential (primary) hypertension: Secondary | ICD-10-CM

## 2021-04-16 DIAGNOSIS — E785 Hyperlipidemia, unspecified: Secondary | ICD-10-CM

## 2021-04-16 DIAGNOSIS — N289 Disorder of kidney and ureter, unspecified: Secondary | ICD-10-CM

## 2021-04-16 DIAGNOSIS — N183 Chronic kidney disease, stage 3 unspecified: Secondary | ICD-10-CM

## 2021-04-16 DIAGNOSIS — E0822 Diabetes mellitus due to underlying condition with diabetic chronic kidney disease: Secondary | ICD-10-CM

## 2021-04-17 DIAGNOSIS — N183 Chronic kidney disease, stage 3 unspecified: Secondary | ICD-10-CM

## 2021-04-17 DIAGNOSIS — E785 Hyperlipidemia, unspecified: Secondary | ICD-10-CM

## 2021-04-17 DIAGNOSIS — E0822 Diabetes mellitus due to underlying condition with diabetic chronic kidney disease: Secondary | ICD-10-CM | POA: Diagnosis not present

## 2021-04-17 DIAGNOSIS — I10 Essential (primary) hypertension: Secondary | ICD-10-CM | POA: Diagnosis not present

## 2021-04-17 NOTE — Chronic Care Management (AMB) (Signed)
Chronic Care Management Pharmacy Note  04/17/2021 Name:  Vincent Allen MRN:  [id number] DOB:  1934-01-02  Summary:  Lowered dose of Januvia to take 76m - 0.5 tablet = 211mdaily based on recent decrease in renal function. Could also consider adding Farxiga for CKD as long as GFR remains > 2559min since patient has CHF, CKD and DM (could also help with edema); but at this eGFR patient will likely not get much blood glucose lowering from SGLT2. Will see what nephrologist recommends after appointment 04/20/2021. After you meet with kidney specialist we will apply for Januvia patient assistance if they recommended to continue Januvia Recommended he hold zinc and Glucocil supplements - discuss with nephrologist./ kidney specialist if these are safe to restart.  Continue to check blood glucose / sugar twice a day - notify our office you if you have reading <80 or >250.  Subjective: Vincent Allen an [age]o. year old male who is a primary patient of BlyMosie LukesD.  The CCM team was consulted for assistance with disease management and care coordination needs.    Engaged with patient by telephone for follow up visit in response to provider referral for pharmacy case management and/or care coordination services.   Consent to Services:  The patient was given information about Chronic Care Management services, agreed to services, and gave verbal consent prior to initiation of services.  Please see initial visit note for detailed documentation.   Patient Care Team: BlyMosie LukesD as PCP - General (Family Medicine) HilDebara PickettnNadean CorwinD as PCP - Cardiology (Cardiology) GroClent JacksD as Consulting Physician (Ophthalmology) JonDanella SensingD as Consulting Physician (Dermatology) NelDorothy SparkD as Consulting Physician (Cardiology) WhiGarald BaldingD as Consulting Physician (Orthopedic Surgery) Regal, NorTamala FothergillPM as Consulting Physician (Podiatry) EckCherre RobinsRPH-CPP (Pharmacist)  Recent Office Visits:  03/23/2021 - Fam Med (Dr BlyCharlett Blake/U diabetes; patient experience 2 hypoglycemia events recently. Lowered dose of glipizide to 5mg79mice a day. Labs checked - showed worsening renal function. Stopped triamterene/HCTZ. rechecked renal funciton in 1 week. Referral to nephrology. 01/25/2021 - Fam Med (Dr BlytCharlett Blakedeo Visit for right leg pain and swelling / HTN. Recommended compression hose; increase furosemide to 2 tablets for 3 days, then go back to 1 tablet daily. Ordered ultrasound to r/o DVT.  Ultrasound showed no DVT in right leg.   Recent Consults:  02/26/2021 - Podiatry (Dr GalaElisha Pondernual Diabetic foot exam 02/01/2021 - [facility]Sophronia Simasgery (Dr WhitDurward Fortesen for right knee pain. Noted to have osteoarthritis of right knee and popliteal cyst per ultrasound. No intervention at this time as patient denied any symptoms.  Hospital visits: None in previous 6 months  Objective:  Lab Results  Component Value Date   CREATININE 2.32 (H) 04/09/2021   CREATININE 2.17 (H) 04/02/2021   CREATININE 1.71 (H) 11/30/2020    Lab Results  Component Value Date   HGBA1C 5.8 04/02/2021   Last diabetic Eye exam:  Lab Results  Component Value Date/Time   HMDIABEYEEXA No Retinopathy 05/26/2019 12:00 AM    Last diabetic Foot exam: No results found for: HMDIABFOOTEX      Component Value Date/Time   CHOL 125 04/02/2021 1456   TRIG 84.0 04/02/2021 1456   HDL 31.30 (L) 04/02/2021 1456   CHOLHDL 4 04/02/2021 1456   VLDL 16.8 04/02/2021 1456   LDLCALC 77 04/02/2021 1456   LDLCALC 101 (H) 12/16/2019 1543   LDLDIRECT 65.0 11/03/2018 1038  Hepatic Function Latest Ref Rng & Units 04/09/2021 04/02/2021 11/30/2020  Total Protein 6.0 - 8.3 g/dL 6.5 6.9 6.9  Albumin 3.5 - 5.2 g/dL 3.3(L) 3.6 3.7  AST 0 - 37 U/L _0 ALT 0 - 53 U/L _1 Alk Phosphatase 39 - 117 U/L 84 77 125(H)  Total Bilirubin 0.2 - 1.2 mg/dL 1.6(H) 1.8(H) 2.0(H)  Bilirubin, Direct 0.0 -  0.3 mg/dL - - -    Lab Results  Component Value Date/Time   TSH 4.58 04/02/2021 02:56 PM   TSH 4.55 11/30/2020 04:00 PM   FREET4 1.01 08/11/2020 04:25 PM    CBC Latest Ref Rng & Units 04/09/2021 04/02/2021 11/30/2020  WBC 4.0 - 10.5 K/uL 9.4 10.0 6.7  Hemoglobin 13.0 - 17.0 g/dL 10.8(L) 11.1(L) 13.8  Hematocrit 39.0 - 52.0 % 33.6(L) 34.6(L) 43.0  Platelets 150.0 - 400.0 K/uL 257.0 229.0 142.0(L)    Lab Results  Component Value Date/Time   VD25OH 76.09 08/10/2020 03:13 PM   VD25OH 74.75 04/20/2020 11:06 AM    Clinical ASCVD: Yes  The ASCVD Risk score (Arnett DK, et al., 2019) failed to calculate for the following reasons:   The 2019 ASCVD risk score is only valid for ages 27 to 67     Social History   Tobacco Use  Smoking Status Never  Smokeless Tobacco Never   BP Readings from Last 3 Encounters:  04/02/21 122/66  11/30/20 130/75  10/20/20 126/76   Pulse Readings from Last 3 Encounters:  04/02/21 62  11/30/20 79  10/20/20 66   Wt Readings from Last 3 Encounters:  01/25/21 237 lb (107.5 kg)  01/01/21 234 lb (106.1 kg)  11/30/20 228 lb 3.2 oz (103.5 kg)    Assessment: Review of patient past medical history, allergies, medications, health status, including review of consultants reports, laboratory and other test data, was performed as part of comprehensive evaluation and provision of chronic care management services.   SDOH:  (Social Determinants of Health) assessments and interventions performed:  SDOH Interventions    Flowsheet Row Most Recent Value  SDOH Interventions   Financial Strain Interventions --  [starting process for Januvia PAP - provided application but will hold off until after patient sees nephrologist]        CCM Care Plan  Allergies  Allergen Reactions   Atorvastatin     Myalgias / leg pain and weakness   Penicillins Hives and Itching    Has patient had a PCN reaction causing immediate rash, facial/tongue/throat swelling, SOB or  lightheadedness with hypotension:Yes Has patient had a PCN reaction causing severe rash involving mucus membranes or skin necrosis:No Has patient had a PCN reaction that required hospitalization:No Has patient had a PCN reaction occurring within the last 10 years:No If all of the above answers are "NO", then may proceed with Cephalosporin use.    Verapamil     Junctional rhythm   Influenza Vaccines Rash    tinnitus    Medications Reviewed Today     Reviewed by Cherre Robins, RPH-CPP (Pharmacist) on 04/16/21 at 1430  Med List Status: <None>   Medication Order Taking? Sig Documenting Provider Last Dose Status Informant  amLODipine (NORVASC) 5 MG tablet [id number] Yes TAKE 1 TABLET BY MOUTH EVERY DAY [name], MD Taking Active   Calcium 600 MG tablet [id number] Yes Take 1 tablet (600 mg total) by mouth 2 (two) times daily. Burnice Logan, MD Taking Active Self  ELIQUIS 2.5 MG TABS tablet [id number]  Yes TAKE 1 TABLET BY MOUTH TWICE A DAY [name], MD Taking Active   finasteride (PROSCAR) 5 MG tablet [id number] Yes Take 5 mg by mouth daily. [provider] Taking Active   furosemide (LASIX) 20 MG tablet [id number] Yes TAKE 1-2 TABLETS BY MOUTH DAILY AS NEEDED FOR EDEMA (WEIGHT GAIN >3LBS IN 24 HRS, SOB). [name], MD Taking Active   glipiZIDE (GLUCOTROL) 10 MG tablet [id number] Yes Take 0.5 tablets (5 mg total) by mouth 2 (two) times daily before a meal.  Patient taking differently: Take 5 mg by mouth daily before breakfast.   [name], MD Taking Active   glucose blood (ONE TOUCH ULTRA TEST) test strip [id number] Yes USE 1 STRIP 2 TIMES DAILY TO CHECK BLOOD SUGAR DX E08.22, N18.3 [name], MD Taking Active   JANUVIA 50 MG tablet [id number] Yes TAKE 1 TABLET BY MOUTH EVERY DAY [name], MD Taking Active            Med Note Antony Contras, [place] B   Wed Mar 07, 2021  1:39 PM) Approved to get from [employer] thru 03/17/2021  Krill Oil 1000 MG CAPS [id number] Yes  Take 1 capsule by mouth daily. [provider] Taking Active   metoprolol succinate (TOPROL-XL) 25 MG 24 hr tablet [id number] Yes TAKE 1 TABLET BY MOUTH EVERY DAY [name], MD Taking Active   mupirocin ointment (BACTROBAN) 2 % [id number] Yes Apply topically daily. [provider] Taking Active   silodosin (RAPAFLO) 4 MG CAPS capsule [id number] Yes Take 4 mg by mouth daily. [provider] Taking Active   triamcinolone cream (KENALOG) 0.1 % [id number] Yes APPLY TO AFFECTED AREA TWICE A DAY [name], MD Taking Active   triamterene-hydrochlorothiazide [facility]) 75-50 MG tablet [id number] Yes TAKE 1 TABLET BY MOUTH EVERY DAY [name], MD Taking Active            Med Note [facility], [place] B   Mon Apr 16, 2021  2:30 PM) Holding due to increased renal function  Vitamin D, Cholecalciferol, 25 MCG (1000 UT) CAPS [id number] Yes Take 1 capsule by mouth daily. [provider] Taking Active   Zinc 50 MG TABS [id number] Yes Take 1 tablet by mouth daily. [provider] Taking Active             Patient Active Problem List   Diagnosis Date Noted   Contusion 04/02/2021   SOB (shortness of breath) 04/02/2021   Unilateral primary osteoarthritis, right knee 02/01/2021   Fatty liver 12/01/2020   Pedal edema 08/14/2020   Knee pain, bilateral 09/20/2019   Morbid obesity ([place]) 09/14/2019   Urinary frequency 11/06/2016   S/P placement of cardiac pacemaker 10/24/2016   Chronic diastolic CHF (congestive heart failure) ([place]) 10/24/2016   Cardiac device in situ    Debility 08/15/2016   Persistent atrial fibrillation ([place]) 07/09/2016   Vitamin D deficiency 07/17/2015   Preventative health care 07/17/2015   Lumbago 12/02/2014   Pain of toe of left foot 03/14/2014   Medicare annual wellness visit, subsequent 01/17/2013   Essential hypertension 01/13/2013   Chronic kidney disease stage III (GFR 30-59 ml/min) 05/03/2012   Insomnia 06/27/2011    Osteopenia 09/02/2010   Family history of colon cancer 09/02/2010   Diabetes mellitus with chronic kidney disease ([place])    Hypertensive heart disease    Hyperlipidemia LDL goal <70    Carotid artery disease (HCC)    GERD (gastroesophageal reflux disease)  CTS (carpal tunnel syndrome) 08/28/2010    Immunization History  Administered Date(s) Administered   Pneumococcal Conjugate-13 03/23/2015   Pneumococcal Polysaccharide-23 06/27/2011   Tdap 05/19/2014    Conditions to be addressed/monitored: Atrial Fibrillation, CHF, CAD, HTN, HLD, DMII and osteopenia; CKD - stage 3b  Care Plan : General Pharmacy (Adult)  Updates made by Cherre Robins, RPH-CPP since 04/17/2021 12:00 AM     Problem: Provide education, support and care coordination for medication therapy and chronic conditions   Priority: High  Onset Date: 07/13/2020  Note:   CARE PLAN ENTRY   Current Barriers:  Decreasing renal function - evaluate for need to adjust medications Not checking blood pressure or weight daily   Hypertension / High Blood pressure  BP goal <140/90 - Controlled Current regimen:  Amlodipine 59m daily in morning Metoprolol succinate 276mtwice a day Previous meds tried: triamterene-hydrochlorothiazide (stopped due to increase in Scr)  Checks blood pressure at home sometimes. No readings to report today Denies shortness of breath;  Son reports patient has trace edema since stopped triamterene-hydrochlorothiazide. Patient will be seen in office to recheck blood pressure and edema 04/18/2021 and to have CMP rechecked.  Interventions:  Recommended continue current antihypertensive therapy Reviewed blood pressure goal  Chronic Heart Failure (HFrEF) / chronic venous insufficiency:  Stable; Goal: prevention of CHF exacerbations and minimize symptoms of CHF. Current Regimen:  Furosemide 204maily if needed for weight gain of 3lbs on 24 hours or for shortness of breath Metoprolol succinate ER 27m78mwice a day Last EF = 40-45% (08/18/2020) Patient has scales at home but only checks weight 2 or 3 times per week. Denies shortness of breath Son reports patient has trace edema since stopped triamterene-hydrochlorothiazide. Patient will be seen in office to recheck blood pressure and edema 04/18/2021 and to have CMP rechecked.  Interventions:  Discussed importance of checking weight daily  Reviewed when to take as needed furosemide  Hyperlipidemia  LDL goal < 70 - not currently at goal (last LDL was 86)  Tried atorvastatin in past - caused leg pain and weakness Current regimen:  Diet and exercise management   Krill Oil 1000mg3mly Interventions: Consider retrial of alternative statin like  rosuvastatin 5mg d63my - patient declined Recommend limiting intake of food high in saturated fat / avoid fried foods; increase intake of vegetables Continue Krill Oil  Diabetes Lab Results  Component Value Date   HGBA1C 5.8 04/02/2021  . A1c is at goal Current regimen:  Januvia 50mg 156mlet daily in morning Glipizide 10mg - 21m 0.5 tablets = 5mg with47mrning meal and with evening meal Glucocil - take 2 tablets twice a day Checks BG daily  Last week has 2 lows. Was 67 during66ight. Dose of glipizide was lowered to 5mg twice90mday Since dose change blood glucose has been 190, 174, 139, 170, 211, 149 and 157 Patient received Januvia from patient assistance program in 2022. I assisted in requesting refill from [employer] today. Patient reports he received 2023 [id number] for patient assistance program for Januvia and he mailed back to the patient assistance program program however I spoke with representative at [employer] and DIRECTVhad not received yet as of 04/17/2021. Recent serum creatinine was elevated 2.17 and eGFR was 27mL/min (27m6/2023) ; checked again 04/09/2021 and Scr was 2.32 and eGFR was 24.96 Interventions: Decreased dose of Januvia to 27mg daily 27md on recent decreased in eGFR. Patient  to see Nephrologist 04/20/2021. Could consider either continuing Januvia 27mg or27m  change to Tradjenta 66m daily which does not need to be adjusted based on renal function.  Could also consider adding Farxiga for CKD as long as GFR remains > 276mmin since patient has CHF, CKD and DM (could also help with edema); but at this eGFR patient will likely not get much blood glucose lowering from SGLT2.  Continue to check blood glucose / sugar 1 to 2 times per day. Notify PCP office if experience blood glucose < 80 Consider less aggressive A1c goal of <7.5% given patient's age and CVD history.  Reviewed home blood glucose readings and reviewed goals  Fasting blood glucose goal (before meals) = 80 to 130 Blood glucose goal after a meal = less than 180   Atrial fibrillation  Current regimen:  Eliquis 2.55m755mwice daily in morning and with dinner Metoprolol succinate 255m83mice a day Patient did reach coverage gap in 2022 but not 3% out of pocket spend to apply for [employer] patient assistance program for 2022. Benefits have reset for 2023.  Interventions: Maintain current atrial fibrillation medication regimen Will monitor medication cost in 2023 and apply for patient assistance program for Eliquis if patient qualifies.    Decreased Kidney Function:  BMET Lab Results  Component Value Date   NA 133 (L) 04/09/2021   K 3.5 04/09/2021   CO2 25 04/09/2021   GLUCOSE 149 (H) 04/09/2021   BUN 89 (HH) 04/09/2021   CREATININE 2.32 (H) 04/09/2021   CALCIUM 9.0 04/09/2021   GFRNONAA 24.96 (L) 04/09/2021   Current regimen:  none Interventions: Reviewed medications to see if any need adjustment based on current renal function.  Lowered dose of januvia to 255mg57mly Consider SGLT2 for CKD, CHF and DM Patient to see nephrologist 04/20/2021 Avoid over the counter pain relievers like Aleve / naproxen; Motrin / ibuprofen. If need for pain relief, headaches or fever - Tylenol /  acetaminophen 500mg 27mo 2 tablets every 8 hours is safer option.  Reviewed patient's over-the-counter medications - recommended he hold Glucocil (since contains chromium picolinate and Milberry Extract) and Zinc supplement until discusses with nephrologist.   Osteopenia:  Last DEXA 12/2019 Lowest T-Score = -2.4 at left femur neck ;  Right femur neck = -1.4 History of fracture foot and shoulder. Per patient sustained from fall from standing position  Fall Risk  01/01/2021 07/13/2020 10/06/2019 09/14/2019 02/10/2019  Falls in the past year? 0 0 0 0 0  Comment - - - - Emmi Telephone Survey: data to providers prior to load  Number falls in past yr: 0 0 0 - -  Injury with Fall? 0 0 0 - -  Comment - - - - -  Risk for fall due to : No Fall Risks History of fall(s);Impaired balance/gait - - -  Follow up Falls prevention discussed Falls prevention discussed;Falls evaluation completed Education provided;Falls prevention discussed - -  Interventions: (addressed at previous visit) Continue to take calcium + D daily Discussed fall prevention Continue to use cane for ambulation assistance Consider recheck DEXA in 2023  Medication management Pharmacist Clinical Goal(s): Over the next 90 days, patient will work with PharmD and providers to achieve optimal medication adherence [facility] Interventions Comprehensive medication review performed. Continue current medication management strategy Discussed over-the-counter therapy and renal function - recommended holding Gulcocil and zinc supplements until discussed further with nephrologist.   Please see past updates related to this goal by clicking on the "Past Updates" button in the selected goal  Medication Assistance: approved for patient assistance for Januvia in June 2022 through 03/17/2021. Patient has mailed application for 1916 and is currently in process.  Patient did not meet 3% out of pocket spend for Eliquis patient  assistance program in 2022 but will follow in 2023.    Patient's preferred pharmacy is:  [id number] [place], [place] - [address] [place] [id number]: [phone]: [phone]   Follow Up:  Patient agrees to Care Plan and Follow-up.    Telephone follow up appointment with care management team member scheduled for:  2 weeks  TCherre Robins PharmD Clinical Pharmacist [facility]

## 2021-04-17 NOTE — Patient Instructions (Signed)
Vincent Allen It was a pleasure speaking with you today.  Below is a summary of our phone visit and recommendation:   Lower dose of Januvia to take 50mg  - 0.5 tablet = 25mg  daily  After you meet with kidney specialist we will apply for Januvia patient assistance if they recommend for you to continue Januvia Hold zinc and Glucocil supplements until you see kidney specialist - discuss with nephrologist./ kidney specialist if these are safe to restart.  Continue to check your blood glucose / sugar twice a day - notify our office you if you have reading <80 or >250.  If you have any questions or concerns, please feel free to contact me either at the phone number below or with a MyChart message.   Keep up the good work!  Cherre Robins, PharmD Clinical Pharmacist [facility] [phone] (direct line)  [phone] (main office number)  CARE PLAN ENTRY    Hypertension / High Blood pressure BP Readings from Last 3 Encounters:  04/02/21 122/66  11/30/20 130/75  10/20/20 126/76   Pharmacist Clinical Goal(s): Over the next 90 days, patient will work with PharmD and providers to maintain BP goal <140/90 Current regimen:  Amlodipine 5mg  daily in morning Metoprolol succinate 25mg  twice a day (recently increase from once daily) Interventions: Continue current hypertension medication regimen.  Reviewed blood pressure goal   Hyperlipidemia Lab Results  Component Value Date/Time   LDLCALC 77 04/02/2021 02:56 PM   LDLCALC 101 (H) 12/16/2019 03:43 PM   LDLDIRECT 65.0 11/03/2018 10:38 AM   Pharmacist Clinical Goal(s): Over the next 90 days, patient will work with PharmD and providers to achieve LDL goal < 70 Current regimen:  Diet and exercise management   Krill Oil 1000mg  daily Interventions: Discussed past experiences with atorvastatin - caused leg pain and weakness Recommend limiting intake of food high in saturated fat / avoid fried foods; increase  intake of vegetables Patient self care activities - Over the next 90 days, patient will: Achieve LDL < 70 Continue Krill Oil  Diabetes Lab Results  Component Value Date/Time   HGBA1C 5.8 04/02/2021 02:56 PM   HGBA1C 6.3 11/30/2020 04:00 PM   Pharmacist Clinical Goal(s): Over the next 90 days, patient will work with PharmD and providers to achieve A1c goal <7% Current regimen:  Januvia 50mg  1 tablet daily in morning Glipizide 10mg  - take 0.5 talbet = 5mg  twice a day with morning and evening meals.  Glucocil - take 2 tablets twice a day Interventions: Reviewed medication dose and renal funciton  Recommended checking blood glucose / sugar daily Reviewed home blood glucose readings and reviewed goals  Fasting blood glucose goal (before meals) = 80 to 130 Blood glucose goal after a meal = less than 180  Patient self care activities - Over the next 90 days, patient will: Decrease dose of Januvia to 25mg  daily .   Continue to check blood glucose / sugar 1 to 2 times per day. Notify PCP office if experience blood glucose < 80 or > 250 Reviewed home blood glucose readings and reviewed goals  Fasting blood glucose goal (before meals) = 80 to 130 Blood glucose goal after a meal = less than 180   Atrial fibrillation  Pharmacist Clinical Goal(s) Over the next 90 days, patient will work with PharmD and providers to reduce complications associated with atrial fibrillation Current regimen:  Eliquis 2.5mg  twice daily in morning and with dinner Metoprolol succinate 25mg  twice a day Interventions: Maintain current  atrial fibrillation medication regimen Will continue to monitor in 2023 for patient assistance program needs regarding Eliquis  Decreased Kidney Function:  Pharmacist Clinical Goal(s) Over the next 90 days, patient will work with PharmD and providers to slow / prevent further decrease in kidney function Current regimen:  none Interventions: Reviewed medications for adjustment  based on kidney funciton Patient self care activities - Over the next 90 days, patient will: Avoid over the counter pain relievers like Aleve / naproxen; Motrin / ibuprofen. If you need anything for pain relief, headaches or fever - Tylenol / acetaminophen 500mg  up to 2 tablets every 8 hours is safer option.  Appointment with Kidney specialist 04/20/2021 Stop taking zinc and Gulcocil supplements until you discuss further with nephrologist.   Medication management Pharmacist Clinical Goal(s): Over the next 90 days, patient will work with PharmD and providers to achieve optimal medication adherence Current pharmacy: [facility] Interventions Comprehensive medication review performed. Continue current medication management strategy Patient self care activities - Over the next 90 days, patient will: Focus on medication adherence by filling and taking medications appropriately  Take medications as prescribed Report any questions or concerns to PharmD and/or provider(s) Discussed over-the-counter therapy and renal function - recommended holding Gulcocil and zinc supplements until discussed further with nephrologist  The patient verbalized understanding of instructions, educational materials, and care plan provided today and agreed to receive a mailed copy of patient instructions, educational materials, and care plan.

## 2021-04-18 ENCOUNTER — Ambulatory Visit: Payer: PPO

## 2021-04-18 ENCOUNTER — Other Ambulatory Visit: Payer: PPO

## 2021-04-19 ENCOUNTER — Ambulatory Visit: Payer: PPO

## 2021-04-19 ENCOUNTER — Other Ambulatory Visit: Payer: PPO

## 2021-04-20 ENCOUNTER — Encounter (HOSPITAL_COMMUNITY): Payer: Self-pay | Admitting: Emergency Medicine

## 2021-04-20 ENCOUNTER — Inpatient Hospital Stay (HOSPITAL_COMMUNITY)
Admission: EM | Admit: 2021-04-20 | Discharge: 2021-05-03 | DRG: 291 | Disposition: A | Discharge status: Home Health | Payer: PPO | Attending: Internal Medicine | Admitting: Internal Medicine

## 2021-04-20 ENCOUNTER — Observation Stay (HOSPITAL_COMMUNITY): Payer: PPO

## 2021-04-20 ENCOUNTER — Emergency Department (HOSPITAL_COMMUNITY): Payer: PPO

## 2021-04-20 DIAGNOSIS — J9811 Atelectasis: Secondary | ICD-10-CM | POA: Diagnosis not present

## 2021-04-20 DIAGNOSIS — Z452 Encounter for adjustment and management of vascular access device: Secondary | ICD-10-CM

## 2021-04-20 DIAGNOSIS — I472 Ventricular tachycardia, unspecified: Secondary | ICD-10-CM | POA: Diagnosis not present

## 2021-04-20 DIAGNOSIS — E876 Hypokalemia: Secondary | ICD-10-CM | POA: Diagnosis present

## 2021-04-20 DIAGNOSIS — Z7901 Long term (current) use of anticoagulants: Secondary | ICD-10-CM

## 2021-04-20 DIAGNOSIS — N184 Chronic kidney disease, stage 4 (severe): Secondary | ICD-10-CM | POA: Diagnosis not present

## 2021-04-20 DIAGNOSIS — E0822 Diabetes mellitus due to underlying condition with diabetic chronic kidney disease: Secondary | ICD-10-CM | POA: Diagnosis not present

## 2021-04-20 DIAGNOSIS — I5031 Acute diastolic (congestive) heart failure: Secondary | ICD-10-CM | POA: Diagnosis not present

## 2021-04-20 DIAGNOSIS — R7989 Other specified abnormal findings of blood chemistry: Secondary | ICD-10-CM | POA: Diagnosis present

## 2021-04-20 DIAGNOSIS — E1122 Type 2 diabetes mellitus with diabetic chronic kidney disease: Secondary | ICD-10-CM | POA: Diagnosis present

## 2021-04-20 DIAGNOSIS — I10 Essential (primary) hypertension: Secondary | ICD-10-CM | POA: Diagnosis not present

## 2021-04-20 DIAGNOSIS — E1165 Type 2 diabetes mellitus with hyperglycemia: Secondary | ICD-10-CM | POA: Diagnosis present

## 2021-04-20 DIAGNOSIS — R0602 Shortness of breath: Secondary | ICD-10-CM | POA: Diagnosis present

## 2021-04-20 DIAGNOSIS — Z20822 Contact with and (suspected) exposure to covid-19: Secondary | ICD-10-CM | POA: Diagnosis present

## 2021-04-20 DIAGNOSIS — I4819 Other persistent atrial fibrillation: Secondary | ICD-10-CM

## 2021-04-20 DIAGNOSIS — I5043 Acute on chronic combined systolic (congestive) and diastolic (congestive) heart failure: Secondary | ICD-10-CM

## 2021-04-20 DIAGNOSIS — N1831 Chronic kidney disease, stage 3a: Secondary | ICD-10-CM | POA: Diagnosis not present

## 2021-04-20 DIAGNOSIS — K219 Gastro-esophageal reflux disease without esophagitis: Secondary | ICD-10-CM | POA: Diagnosis present

## 2021-04-20 DIAGNOSIS — I5082 Biventricular heart failure: Secondary | ICD-10-CM | POA: Diagnosis present

## 2021-04-20 DIAGNOSIS — I509 Heart failure, unspecified: Secondary | ICD-10-CM | POA: Diagnosis not present

## 2021-04-20 DIAGNOSIS — N170 Acute kidney failure with tubular necrosis: Secondary | ICD-10-CM

## 2021-04-20 DIAGNOSIS — Z823 Family history of stroke: Secondary | ICD-10-CM

## 2021-04-20 DIAGNOSIS — I779 Disorder of arteries and arterioles, unspecified: Secondary | ICD-10-CM | POA: Diagnosis present

## 2021-04-20 DIAGNOSIS — Z950 Presence of cardiac pacemaker: Secondary | ICD-10-CM | POA: Diagnosis not present

## 2021-04-20 DIAGNOSIS — E559 Vitamin D deficiency, unspecified: Secondary | ICD-10-CM | POA: Diagnosis present

## 2021-04-20 DIAGNOSIS — I50811 Acute right heart failure: Secondary | ICD-10-CM | POA: Insufficient documentation

## 2021-04-20 DIAGNOSIS — N179 Acute kidney failure, unspecified: Secondary | ICD-10-CM | POA: Diagnosis not present

## 2021-04-20 DIAGNOSIS — E669 Obesity, unspecified: Secondary | ICD-10-CM | POA: Diagnosis present

## 2021-04-20 DIAGNOSIS — E1129 Type 2 diabetes mellitus with other diabetic kidney complication: Secondary | ICD-10-CM | POA: Diagnosis not present

## 2021-04-20 DIAGNOSIS — I4891 Unspecified atrial fibrillation: Secondary | ICD-10-CM | POA: Diagnosis not present

## 2021-04-20 DIAGNOSIS — N1832 Chronic kidney disease, stage 3b: Secondary | ICD-10-CM | POA: Diagnosis present

## 2021-04-20 DIAGNOSIS — R188 Other ascites: Secondary | ICD-10-CM | POA: Diagnosis not present

## 2021-04-20 DIAGNOSIS — Z66 Do not resuscitate: Secondary | ICD-10-CM | POA: Diagnosis present

## 2021-04-20 DIAGNOSIS — N189 Chronic kidney disease, unspecified: Secondary | ICD-10-CM | POA: Diagnosis not present

## 2021-04-20 DIAGNOSIS — I130 Hypertensive heart and chronic kidney disease with heart failure and stage 1 through stage 4 chronic kidney disease, or unspecified chronic kidney disease: Principal | ICD-10-CM | POA: Diagnosis present

## 2021-04-20 DIAGNOSIS — Z833 Family history of diabetes mellitus: Secondary | ICD-10-CM | POA: Diagnosis not present

## 2021-04-20 DIAGNOSIS — E440 Moderate protein-calorie malnutrition: Secondary | ICD-10-CM | POA: Diagnosis not present

## 2021-04-20 DIAGNOSIS — Z8249 Family history of ischemic heart disease and other diseases of the circulatory system: Secondary | ICD-10-CM | POA: Diagnosis not present

## 2021-04-20 DIAGNOSIS — R0600 Dyspnea, unspecified: Secondary | ICD-10-CM

## 2021-04-20 DIAGNOSIS — N183 Chronic kidney disease, stage 3 unspecified: Secondary | ICD-10-CM | POA: Diagnosis not present

## 2021-04-20 DIAGNOSIS — Z6832 Body mass index (BMI) 32.0-32.9, adult: Secondary | ICD-10-CM

## 2021-04-20 DIAGNOSIS — Z79899 Other long term (current) drug therapy: Secondary | ICD-10-CM

## 2021-04-20 DIAGNOSIS — Z959 Presence of cardiac and vascular implant and graft, unspecified: Secondary | ICD-10-CM | POA: Diagnosis not present

## 2021-04-20 DIAGNOSIS — I495 Sick sinus syndrome: Secondary | ICD-10-CM | POA: Diagnosis present

## 2021-04-20 DIAGNOSIS — E785 Hyperlipidemia, unspecified: Secondary | ICD-10-CM | POA: Diagnosis present

## 2021-04-20 DIAGNOSIS — I129 Hypertensive chronic kidney disease with stage 1 through stage 4 chronic kidney disease, or unspecified chronic kidney disease: Secondary | ICD-10-CM | POA: Diagnosis not present

## 2021-04-20 DIAGNOSIS — R778 Other specified abnormalities of plasma proteins: Secondary | ICD-10-CM | POA: Diagnosis present

## 2021-04-20 DIAGNOSIS — Z808 Family history of malignant neoplasm of other organs or systems: Secondary | ICD-10-CM | POA: Diagnosis not present

## 2021-04-20 DIAGNOSIS — L0390 Cellulitis, unspecified: Secondary | ICD-10-CM | POA: Diagnosis not present

## 2021-04-20 DIAGNOSIS — E871 Hypo-osmolality and hyponatremia: Secondary | ICD-10-CM | POA: Diagnosis present

## 2021-04-20 DIAGNOSIS — E66811 Obesity, class 1: Secondary | ICD-10-CM | POA: Diagnosis present

## 2021-04-20 DIAGNOSIS — Z7984 Long term (current) use of oral hypoglycemic drugs: Secondary | ICD-10-CM

## 2021-04-20 DIAGNOSIS — I5033 Acute on chronic diastolic (congestive) heart failure: Secondary | ICD-10-CM | POA: Diagnosis present

## 2021-04-20 DIAGNOSIS — I517 Cardiomegaly: Secondary | ICD-10-CM | POA: Diagnosis not present

## 2021-04-20 LAB — CBC WITH DIFFERENTIAL/PLATELET
Abs Immature Granulocytes: 0.06 10*3/uL (ref 0.00–0.07)
Basophils Absolute: 0 10*3/uL (ref 0.0–0.1)
Basophils Relative: 0 %
Eosinophils Absolute: 0.1 10*3/uL (ref 0.0–0.5)
Eosinophils Relative: 1 %
HCT: 37 % — ABNORMAL LOW (ref 39.0–52.0)
Hemoglobin: 11.9 g/dL — ABNORMAL LOW (ref 13.0–17.0)
Immature Granulocytes: 1 %
Lymphocytes Relative: 15 %
Lymphs Abs: 1.5 10*3/uL (ref 0.7–4.0)
MCH: 27.5 pg (ref 26.0–34.0)
MCHC: 32.2 g/dL (ref 30.0–36.0)
MCV: 85.6 fL (ref 80.0–100.0)
Monocytes Absolute: 1.2 10*3/uL — ABNORMAL HIGH (ref 0.1–1.0)
Monocytes Relative: 12 %
Neutro Abs: 7.3 10*3/uL (ref 1.7–7.7)
Neutrophils Relative %: 71 %
Platelets: 233 10*3/uL (ref 150–400)
RBC: 4.32 MIL/uL (ref 4.22–5.81)
RDW: 14.6 % (ref 11.5–15.5)
WBC: 10.2 10*3/uL (ref 4.0–10.5)
nRBC: 0 % (ref 0.0–0.2)

## 2021-04-20 LAB — HEPATIC FUNCTION PANEL
ALT: 17 U/L (ref 0–44)
AST: 23 U/L (ref 15–41)
Albumin: 2.6 g/dL — ABNORMAL LOW (ref 3.5–5.0)
Alkaline Phosphatase: 65 U/L (ref 38–126)
Bilirubin, Direct: 0.5 mg/dL — ABNORMAL HIGH (ref 0.0–0.2)
Indirect Bilirubin: 0.9 mg/dL (ref 0.3–0.9)
Total Bilirubin: 1.4 mg/dL — ABNORMAL HIGH (ref 0.3–1.2)
Total Protein: 6.5 g/dL (ref 6.5–8.1)

## 2021-04-20 LAB — BASIC METABOLIC PANEL
Anion gap: 15 (ref 5–15)
BUN: 75 mg/dL — ABNORMAL HIGH (ref 8–23)
CO2: 21 mmol/L — ABNORMAL LOW (ref 22–32)
Calcium: 9.3 mg/dL (ref 8.9–10.3)
Chloride: 99 mmol/L (ref 98–111)
Creatinine, Ser: 2.08 mg/dL — ABNORMAL HIGH (ref 0.61–1.24)
GFR, Estimated: 31 mL/min — ABNORMAL LOW (ref >60)
Glucose, Bld: 168 mg/dL — ABNORMAL HIGH (ref 70–99)
Potassium: 3.2 mmol/L — ABNORMAL LOW (ref 3.5–5.1)
Sodium: 135 mmol/L (ref 135–145)

## 2021-04-20 LAB — GLUCOSE, CAPILLARY
Glucose-Capillary: 170 mg/dL — ABNORMAL HIGH (ref 70–99)
Glucose-Capillary: 190 mg/dL — ABNORMAL HIGH (ref 70–99)

## 2021-04-20 LAB — COOXEMETRY PANEL
Carboxyhemoglobin: 1.4 % (ref 0.5–1.5)
Methemoglobin: 0.9 % (ref 0.0–1.5)
O2 Saturation: 66 %
Total hemoglobin: 11.6 g/dL — ABNORMAL LOW (ref 12.0–16.0)

## 2021-04-20 LAB — URINALYSIS, ROUTINE W REFLEX MICROSCOPIC
Bilirubin Urine: NEGATIVE
Glucose, UA: NEGATIVE mg/dL
Hgb urine dipstick: NEGATIVE
Ketones, ur: NEGATIVE mg/dL
Leukocytes,Ua: NEGATIVE
Nitrite: NEGATIVE
Protein, ur: NEGATIVE mg/dL
Specific Gravity, Urine: 1.02 (ref 1.005–1.030)
pH: 5.5 (ref 5.0–8.0)

## 2021-04-20 LAB — BRAIN NATRIURETIC PEPTIDE: B Natriuretic Peptide: 1460.7 pg/mL — ABNORMAL HIGH (ref 0.0–100.0)

## 2021-04-20 LAB — TROPONIN I (HIGH SENSITIVITY)
Troponin I (High Sensitivity): 83 ng/L — ABNORMAL HIGH (ref <18)
Troponin I (High Sensitivity): 93 ng/L — ABNORMAL HIGH (ref <18)

## 2021-04-20 LAB — MAGNESIUM: Magnesium: 2 mg/dL (ref 1.7–2.4)

## 2021-04-20 LAB — RESP PANEL BY RT-PCR (FLU A&B, COVID) ARPGX2
Influenza A by PCR: NEGATIVE
Influenza B by PCR: NEGATIVE
SARS Coronavirus 2 by RT PCR: NEGATIVE

## 2021-04-20 MED ORDER — ACETAMINOPHEN 650 MG RE SUPP: 650.0000 mg | Freq: Four times a day (QID) | RECTAL | Status: DC | PRN

## 2021-04-20 MED ORDER — METOPROLOL SUCCINATE ER 25 MG PO TB24
25.0000 mg | ORAL_TABLET | Freq: Two times a day (BID) | ORAL | Status: DC
  Administered 2021-04-21 – 2021-04-22 (×4): 25 mg via ORAL
  Filled 2021-04-20 (×4): qty 1

## 2021-04-20 MED ORDER — APIXABAN 2.5 MG PO TABS
2.5000 mg | ORAL_TABLET | Freq: Two times a day (BID) | ORAL | Status: DC
  Administered 2021-04-20 – 2021-05-03 (×26): 2.5 mg via ORAL
  Filled 2021-04-20 (×26): qty 1

## 2021-04-20 MED ORDER — SODIUM CHLORIDE 0.9% FLUSH: 3.0000 mL | INTRAVENOUS | Status: DC | PRN

## 2021-04-20 MED ORDER — FUROSEMIDE 10 MG/ML IJ SOLN
40.0000 mg | Freq: Once | INTRAMUSCULAR | Status: AC
  Administered 2021-04-20: 40 mg via INTRAVENOUS
  Filled 2021-04-20: qty 4

## 2021-04-20 MED ORDER — POTASSIUM CHLORIDE CRYS ER 20 MEQ PO TBCR
20.0000 meq | EXTENDED_RELEASE_TABLET | Freq: Once | ORAL | Status: AC
  Administered 2021-04-20: 20 meq via ORAL
  Filled 2021-04-20: qty 1

## 2021-04-20 MED ORDER — FUROSEMIDE 10 MG/ML IJ SOLN
80.0000 mg | Freq: Two times a day (BID) | INTRAMUSCULAR | Status: DC
  Administered 2021-04-21 – 2021-04-22 (×4): 80 mg via INTRAVENOUS
  Filled 2021-04-20 (×4): qty 8

## 2021-04-20 MED ORDER — MILRINONE LACTATE IN DEXTROSE 20-5 MG/100ML-% IV SOLN: 0.2500 ug/kg/min | INTRAVENOUS | Status: DC

## 2021-04-20 MED ORDER — SODIUM CHLORIDE 0.9 % IV SOLN: 250.0000 mL | INTRAVENOUS | Status: DC | PRN

## 2021-04-20 MED ORDER — ACETAMINOPHEN 325 MG PO TABS
650.0000 mg | ORAL_TABLET | Freq: Four times a day (QID) | ORAL | Status: DC | PRN
  Administered 2021-04-22 – 2021-04-30 (×6): 650 mg via ORAL
  Filled 2021-04-20 (×8): qty 2

## 2021-04-20 MED ORDER — SODIUM CHLORIDE 0.9% FLUSH
3.0000 mL | Freq: Two times a day (BID) | INTRAVENOUS | Status: DC
  Administered 2021-04-20 – 2021-04-21 (×2): 3 mL via INTRAVENOUS

## 2021-04-20 MED ORDER — INSULIN ASPART 100 UNIT/ML IJ SOLN
0.0000 [IU] | Freq: Three times a day (TID) | INTRAMUSCULAR | Status: DC
  Administered 2021-04-20 – 2021-04-21 (×2): 2 [IU] via SUBCUTANEOUS
  Administered 2021-04-21: 1 [IU] via SUBCUTANEOUS
  Administered 2021-04-21: 2 [IU] via SUBCUTANEOUS
  Administered 2021-04-22: 1 [IU] via SUBCUTANEOUS
  Administered 2021-04-22 (×2): 2 [IU] via SUBCUTANEOUS

## 2021-04-20 NOTE — Assessment & Plan Note (Addendum)
Blood pressure stable today with systolic 337 range, yesterday was 149 mmHg. Continue close monitoring off antihypertensive therapy.

## 2021-04-20 NOTE — ED Provider Notes (Signed)
[facility] Emergency Department Provider Note MRN:  [id number]  Arrival date & time: 04/20/21     Chief Complaint   Shortness of Breath   History of Present Illness   Vincent Allen is a 88 y.o. year-old male with a history of CKD 3, HTN, HLD presenting to the ED with chief complaint of shortness of breath onset 3 weeks. .   Patient presents to emergency department and when asked what brings him to the emergency department he states "I don't know doc, Im falling apart'.  He states that he has had over 20 pounds of weight gain with associated shortness of breath and lower extremity and abdominal swelling over the past 3 weeks.  The patient states that he has had no fevers but does admit to dry cough and chills as well as orthopnea and exertional shortness of breath.  He denies that he is having chest pain, abdominal pain, headaches, or new rashes.  The patient reports that he has been compliant with his diuretic regimen.  But states that he has been producing less urine than he typically does.  Review of Systems  A thorough review of systems was obtained and all systems are negative except as noted in the HPI and PMH.   Patient's Health History    Past Medical History:  Diagnosis Date   Arthritis    Bradycardia    Cataract    Chronic kidney disease stage III (GFR 30-59 ml/min) 05/03/2012   Colon polyps    Complication of anesthesia    emesis   Diabetes mellitus    GERD (gastroesophageal reflux disease)    HOH (hard of hearing)    HTN (hypertension) 01/13/2013   Hyperlipidemia    Hypertension    Pacemaker 12/2016   Persistent atrial fibrillation ([place])    Presence of permanent cardiac pacemaker 09/20/2016   Symptomatic bradycardia    Vitamin D deficiency 07/17/2015    Past Surgical History:  Procedure Laterality Date   ANKLE ARTHROPLASTY     CARDIOVERSION N/A 08/20/2016   Procedure: CARDIOVERSION;  Surgeon: Sanda Klein, MD;  Location: [facility]  ENDOSCOPY;  Service: Cardiovascular;  Laterality: N/A;   CATARACT EXTRACTION  2010, 2014   CATARACT EXTRACTION  02/28/14   CHOLECYSTECTOMY     CHOLECYSTECTOMY N/A 05/03/2012   Procedure: LAPAROSCOPIC CHOLECYSTECTOMY WITH INTRAOPERATIVE CHOLANGIOGRAM;  Surgeon: [name], MD;  Location: [facility];  Service: General;  Laterality: N/A;   INSERT / REPLACE / REMOVE PACEMAKER  09/20/2016   PACEMAKER IMPLANT N/A 09/20/2016   Procedure: Pacemaker Implant;  Surgeon: Deboraha Sprang, MD;  Location: [facility];  Service: Cardiovascular;  Laterality: N/A;   ROTATOR CUFF REPAIR     SHOULDER SURGERY     TONSILLECTOMY      Family History  Problem Relation Age of Onset   Cancer Mother        colon, breast, pancreas, skin cancer   Heart disease Father        heart valve replaced   Stroke Father    Diabetes Father    Cancer Paternal Grandmother        colon   Obesity Son    Heart disease Son        bradycardia    Social History   Socioeconomic History   Marital status: Widowed    Spouse name: Not on file   Number of children: Not on file   Years of education: Not on file   Highest education  level: Not on file  Occupational History   Not on file  Tobacco Use   Smoking status: Never   Smokeless tobacco: Never  Vaping Use   Vaping Use: Never used  Substance and Sexual Activity   Alcohol use: Yes    Alcohol/week: 1.0 standard drink    Types: 1 Glasses of wine per week   Drug use: No   Sexual activity: Not on file    Comment: lives alone , no major dietary restrictions, 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.  Other Topics Concern   Not on file  Social History Narrative   Not on file   Social Determinants of Health   Financial Resource Strain: Medium Risk   Difficulty of Paying Living Expenses: Somewhat hard  Food Insecurity: No Food Insecurity   Worried About Running Out of Food in the Last Year: Never true   Ran Out of Food in the Last Year: Never true   Transportation Needs: No Transportation Needs   Lack of Transportation (Medical): No   Lack of Transportation (Non-Medical): No  Physical Activity: Insufficiently Active   Days of Exercise per Week: 7 days   Minutes of Exercise per Session: 10 min  Stress: No Stress Concern Present   Feeling of Stress : Not at all  Social Connections: Socially Isolated   Frequency of Communication with Friends and Family: More than three times a week   Frequency of Social Gatherings with Friends and Family: Three times a week   Attends Religious Services: Never   Active Member of Clubs or Organizations: No   Attends Archivist Meetings: Never   Marital Status: Widowed  Human resources officer Violence: Not At Risk   Fear of Current or Ex-Partner: No   Emotionally Abused: No   Physically Abused: No   Sexually Abused: No     Physical Exam   Physical Exam Vitals and nursing note reviewed.  Constitutional:      Appearance: Normal appearance.  HENT:     Head: Normocephalic and atraumatic.  Cardiovascular:     Rate and Rhythm: Normal rate and regular rhythm.     Pulses: Normal pulses.  Pulmonary:     Effort: Pulmonary effort is normal.     Breath sounds: Normal breath sounds. No rales.  Abdominal:     General: Abdomen is flat. There is distension (anasarca to chest wall).     Palpations: Abdomen is soft.     Tenderness: There is no abdominal tenderness. There is no right CVA tenderness or left CVA tenderness.  Musculoskeletal:     Cervical back: Normal range of motion and neck supple.     Right lower leg: Edema present.     Left lower leg: Edema present.     Comments: Lateral pitting edema to knees  Skin:    Capillary Refill: Capillary refill takes less than 2 seconds.  Neurological:     General: No focal deficit present.     Mental Status: He is alert and oriented to person, place, and time.     Cranial Nerves: No cranial nerve deficit.     Sensory: No sensory deficit.     Motor:  No weakness.  Psychiatric:        Mood and Affect: Mood normal.      Diagnostic and Interventional Summary    EKG Interpretation  Date/Time:  Friday April 20 2021 13:03:29 EST Ventricular Rate:  61 PR Interval:    QRS Duration: 190 QT  Interval:  538 QTC Calculation: 541 R Axis:   221 Text Interpretation: Ventricular-paced rhythm Abnormal ECG When compared with ECG of 21-Sep-2016 02:11, PREVIOUS ECG IS PRESENT Confirmed by Noemi Chapel [phone]) on 04/20/2021 3:42:19 PM       Labs Reviewed  BASIC METABOLIC PANEL - Abnormal; Notable for the following components:      Result Value   Potassium 3.2 (*)    CO2 21 (*)    Glucose, Bld 168 (*)    BUN 75 (*)    Creatinine, Ser 2.08 (*)    GFR, Estimated 31 (*)    All other components within normal limits  CBC WITH DIFFERENTIAL/PLATELET - Abnormal; Notable for the following components:   Hemoglobin 11.9 (*)    HCT 37.0 (*)    Monocytes Absolute 1.2 (*)    All other components within normal limits  BRAIN NATRIURETIC PEPTIDE - Abnormal; Notable for the following components:   B Natriuretic Peptide 1,460.7 (*)    All other components within normal limits  TROPONIN I (HIGH SENSITIVITY) - Abnormal; Notable for the following components:   Troponin I (High Sensitivity) 93 (*)    All other components within normal limits  RESP PANEL BY RT-PCR (FLU A&B, COVID) ARPGX2  MAGNESIUM  TROPONIN I (HIGH SENSITIVITY)    DG Chest 2 View  Final Result      Medications  insulin aspart (novoLOG) injection 0-9 Units (has no administration in time range)  furosemide (LASIX) injection 40 mg (40 mg Intravenous Given 04/20/21 1531)  potassium chloride SA (KLOR-CON M) CR tablet 20 mEq (20 mEq Oral Given 04/20/21 1530)     Procedures  /  Critical Care Procedures  ED Course and Medical Decision Making  Initial Impression and Ddx 88 year old male presents to emergency department for evaluation of shortness of breath and weight gain.  Differential  diagnosis includes but not limited to the following: Heart failure exacerbation, nonresponse to diuretics, cardiorenal syndrome, pleural effusions, pneumonia, COVID/flu.  I will obtain labs and imaging to further evaluate.  Past medical/surgical history that increases complexity of ED encounter: CKD, heart failure with reduced ejection fraction.  Interpretation of Diagnostics I personally reviewed the EKG, Chest Xray, and Cardiac Monitor and my interpretation is as follows: Ventricular paced rhythm seen on cardiac monitor.  EKG reviewed and the patient was ventricular paced rhythm.  Chest x-ray with cardiomegaly but no evidence of volume overload.    Patient with an elevated troponin on labs obtained in triage however the patient is not experiencing chest pain at this time.  Have low suspicion for ACS.  Patient did have an elevated BNP over 1000.  Labs reviewed and creatinine at baseline.  Will administer IV Lasix and consult the hospitalist for admission for heart failure exacerbation versus cardiorenal syndrome.  Patient Reassessment and Ultimate Disposition/Management The hospitalist team agrees to admit the patient to their service for further management of his volume overload state.  Suspect that he is having poor response to Lasix regimen at home.  Patient management required discussion with the following services or consulting groups:  Hospitalist Service  Complexity of Problems Addressed Acute illness or injury that poses threat of life of bodily function  Additional Data Reviewed and Analyzed Further history obtained from: Recent PCP notes and Recent Consult notes  Factors Impacting ED Encounter Risk Consideration of hospitalization    Final Clinical Impressions(s) / ED Diagnoses     ICD-10-CM   1. SOB (shortness of breath)  R06.02  Zachery Dakins, MD 04/20/21 1556    Noemi Chapel, MD 04/22/21 [phone]

## 2021-04-20 NOTE — Progress Notes (Signed)
Charge RN notified PICC placement ,will be done tomorrow 04/21/21.

## 2021-04-20 NOTE — Assessment & Plan Note (Addendum)
Mildly elevated with flat trend, do not suspect ACS -Likely demand ischemia in the setting of CHF

## 2021-04-20 NOTE — Assessment & Plan Note (Addendum)
Hypokalemia hyponatremia. Hypomagnesemia  Patient with negative fluid balance.  Renal function with serum cr at 1.92 with K at 2,7 and Na 134 with serum bicarbonate at 33.   Plan to continue K correction with KCL (scheduled to have 110 meq KCl today), plan to follow electrolytes this pm at 16:00  Continue close monitoring renal function and electrolytes.  Holding on loop diuretic therapy for today.

## 2021-04-20 NOTE — Assessment & Plan Note (Addendum)
Replaced. °

## 2021-04-20 NOTE — Consult Note (Signed)
CONSULTATION NOTE   Patient Name: Vincent Allen Date of Encounter: 04/20/2021 Cardiologist: Pixie Casino, MD Electrophysiologist: None Advanced Heart Failure: None   Chief Complaint   Shortness of breath, edema  Patient Profile   88 yo male patient of mine with a history of chronic systolic heart failure (LVEF 40 to 45%), stage IIIa chronic kidney disease, type 2 diabetes, hypertension, dyslipidemia, persistent atrial fibrillation and symptomatic bradycardia status post pacemaker, presents with progressive shortness of breath and lower extremity edema concerning for heart failure.  HPI   Vincent Allen is a 88 y.o. male who is being seen today for the evaluation of CHF at the request of Dr. Rogers Blocker.  This is a pleasant 88 year old male patient of mine who is well-known with a history of chronic systolic heart failure and LVEF 40 to 45%, stage IIIa CKD, type 2 diabetes, hypertension, dyslipidemia and persistent atrial fibrillation also status post permanent pacemaker for symptomatic bradycardia, now presenting with 3-week history of worsening shortness of breath with exertion, 20 to 25 pound weight gain, significant increasing abdominal girth and symptoms concerning for congestive heart failure.  He reports medication compliance.  He was advised by his nephrologist to present to the emergency department.  In the ER was found to have a very high BNP of 1004 and 60, creatinine elevated 2.08 BUN 75, initial troponin 93, chest x-ray showed no pulmonary edema.  He was given 40 mg IV Lasix but has not diuresed much.  Recent remote device check in January 2023 showed permanent AF with appropriate lead histograms.  It should be noted that his office weight was 220 pounds in August 2022. He has not been weighed yet in the ER.  PMHx   Past Medical History:  Diagnosis Date   Arthritis    Bradycardia    Cataract    Chronic kidney disease stage III (GFR 30-59 ml/min) 05/03/2012   Colon polyps     Complication of anesthesia    emesis   Diabetes mellitus    GERD (gastroesophageal reflux disease)    HOH (hard of hearing)    HTN (hypertension) 01/13/2013   Hyperlipidemia    Hypertension    Pacemaker 12/2016   Persistent atrial fibrillation ([place])    Presence of permanent cardiac pacemaker 09/20/2016   Symptomatic bradycardia    Vitamin D deficiency 07/17/2015    Past Surgical History:  Procedure Laterality Date   ANKLE ARTHROPLASTY     CARDIOVERSION N/A 08/20/2016   Procedure: CARDIOVERSION;  Surgeon: Sanda Klein, MD;  Location: [facility];  Service: Cardiovascular;  Laterality: N/A;   CATARACT EXTRACTION  2010, 2014   CATARACT EXTRACTION  02/28/14   CHOLECYSTECTOMY     CHOLECYSTECTOMY N/A 05/03/2012   Procedure: LAPAROSCOPIC CHOLECYSTECTOMY WITH INTRAOPERATIVE CHOLANGIOGRAM;  Surgeon: [name], MD;  Location: [facility];  Service: General;  Laterality: N/A;   INSERT / REPLACE / REMOVE PACEMAKER  09/20/2016   PACEMAKER IMPLANT N/A 09/20/2016   Procedure: Pacemaker Implant;  Surgeon: Deboraha Sprang, MD;  Location: [facility];  Service: Cardiovascular;  Laterality: N/A;   ROTATOR CUFF REPAIR     SHOULDER SURGERY     TONSILLECTOMY      FAMHx   Family History  Problem Relation Age of Onset   Cancer Mother        colon, breast, pancreas, skin cancer   Heart disease Father        heart valve replaced   Stroke Father  Diabetes Father    Cancer Paternal Grandmother        colon   Obesity Son    Heart disease Son        bradycardia    SOCHx    reports that he has never smoked. He has never used smokeless tobacco. He reports current alcohol use of about 1.0 standard drink per week. He reports that he does not use drugs.  Outpatient Medications   No current facility-administered medications on file prior to encounter.   Current Outpatient Medications on File Prior to Encounter  Medication Sig Dispense Refill   acetaminophen (TYLENOL) 500 MG tablet  Take 500 mg by mouth every 6 (six) hours as needed for moderate pain.     amLODipine (NORVASC) 5 MG tablet TAKE 1 TABLET BY MOUTH EVERY DAY (Patient taking differently: Take 5 mg by mouth daily.) 90 tablet 1   ELIQUIS 2.5 MG TABS tablet TAKE 1 TABLET BY MOUTH TWICE A DAY (Patient taking differently: Take 2.5 mg by mouth 2 (two) times daily.) 60 tablet 5   furosemide (LASIX) 20 MG tablet TAKE 1-2 TABLETS BY MOUTH DAILY AS NEEDED FOR EDEMA (WEIGHT GAIN >3LBS IN 24 HRS, SOB). (Patient taking differently: Take 40 mg by mouth daily.) 180 tablet 1   glipiZIDE (GLUCOTROL) 10 MG tablet Take 0.5 tablets (5 mg total) by mouth 2 (two) times daily before a meal. 360 tablet 1   Krill Oil 1000 MG CAPS Take 1,000 mg by mouth daily.     metoprolol succinate (TOPROL-XL) 25 MG 24 hr tablet Take 25 mg by mouth in the morning and at bedtime. 90 tablet 1   Multiple Vitamin (MULTIVITAMIN WITH MINERALS) TABS tablet Take 1 tablet by mouth daily.     OVER THE COUNTER MEDICATION Take 1 capsule by mouth 2 (two) times daily. Glucocil     sitaGLIPtin (JANUVIA) 50 MG tablet Take 25 mg by mouth 2 (two) times daily. 30 tablet 3   Zinc 50 MG TABS Take 50 mg by mouth daily.     glucose blood (ONE TOUCH ULTRA TEST) test strip USE 1 STRIP 2 TIMES DAILY TO CHECK BLOOD SUGAR DX E08.22, N18.3 300 each 1   triamcinolone cream (KENALOG) 0.1 % APPLY TO AFFECTED AREA TWICE A DAY (Patient not taking: Reported on 04/20/2021) 60 g 1   triamterene-hydrochlorothiazide (MAXZIDE) 75-50 MG tablet TAKE 1 TABLET BY MOUTH EVERY DAY (Patient not taking: Reported on 04/20/2021) 90 tablet 1    Inpatient Medications    Scheduled Meds:  insulin aspart  0-9 Units Subcutaneous TID WC   sodium chloride flush  3 mL Intravenous Q12H    Continuous Infusions:  sodium chloride      PRN Meds: sodium chloride, acetaminophen **OR** acetaminophen, sodium chloride flush   ALLERGIES   Allergies  Allergen Reactions   Atorvastatin     Myalgias / leg pain and  weakness   Penicillins Hives and Itching    Has patient had a PCN reaction causing immediate rash, facial/tongue/throat swelling, SOB or lightheadedness with hypotension:Yes Has patient had a PCN reaction causing severe rash involving mucus membranes or skin necrosis:No Has patient had a PCN reaction that required hospitalization:No Has patient had a PCN reaction occurring within the last 10 years:No If all of the above answers are "NO", then may proceed with Cephalosporin use.    Verapamil     Junctional rhythm   Influenza Vaccines Rash    tinnitus    ROS   Pertinent items noted  in HPI and remainder of comprehensive ROS otherwise negative.  Vitals   Vitals:   04/20/21 1505 04/20/21 1515 04/20/21 1530 04/20/21 1545  BP: 134/70 125/70 130/70 127/80  Pulse: (!) 58 (!) 58 (!) 59 62  Resp: 19  20   Temp:      SpO2: 99% 98% 98% 98%   No intake or output data in the 24 hours ending 04/20/21 1633 There were no vitals filed for this visit.  Physical Exam   General appearance: alert, no distress, and pale Neck: JVD - several cm above sternal notch, no carotid bruit, and thyroid not enlarged, symmetric, no tenderness/mass/nodules Lungs: diminished breath sounds bibasilar Heart: regular rate and rhythm and pacer in left upper chest wall Abdomen: Protuberant, nontender to palpation, positive fluid wave, positive HJR Extremities: edema 2-3+ bilateral lower extremity pitting, chronic venous stasis changes Pulses: 1+ bilateral lower extremity Skin: Pale, cool, dry Neurologic: Grossly normal Psych: Pleasant  Labs   Results for orders placed or performed during the hospital encounter of 04/20/21 (from the past 48 hour(s))  Basic metabolic panel     Status: Abnormal   Collection Time: 04/20/21  1:48 PM  Result Value Ref Range   Sodium 135 135 - 145 mmol/L   Potassium 3.2 (L) 3.5 - 5.1 mmol/L   Chloride 99 98 - 111 mmol/L   CO2 21 (L) 22 - 32 mmol/L   Glucose, Bld 168 (H) 70 - 99  mg/dL    Comment: Glucose reference range applies only to samples taken after fasting for at least 8 hours.   BUN 75 (H) 8 - 23 mg/dL   Creatinine, Ser 2.08 (H) 0.61 - 1.24 mg/dL   Calcium 9.3 8.9 - 10.3 mg/dL   GFR, Estimated 31 (L) >60 mL/min    Comment: (NOTE) Calculated using the CKD-EPI Creatinine Equation (2021)    Anion gap 15 5 - 15    Comment: Performed at [facility] [address], [place], [place] [address]  CBC with Differential     Status: Abnormal   Collection Time: 04/20/21  1:48 PM  Result Value Ref Range   WBC 10.2 4.0 - 10.5 K/uL   RBC 4.32 4.22 - 5.81 MIL/uL   Hemoglobin 11.9 (L) 13.0 - 17.0 g/dL   HCT 37.0 (L) 39.0 - 52.0 %   MCV 85.6 80.0 - 100.0 fL   MCH 27.5 26.0 - 34.0 pg   MCHC 32.2 30.0 - 36.0 g/dL   RDW 14.6 11.5 - 15.5 %   Platelets 233 150 - 400 K/uL   nRBC 0.0 0.0 - 0.2 %   Neutrophils Relative % 71 %   Neutro Abs 7.3 1.7 - 7.7 K/uL   Lymphocytes Relative 15 %   Lymphs Abs 1.5 0.7 - 4.0 K/uL   Monocytes Relative 12 %   Monocytes Absolute 1.2 (H) 0.1 - 1.0 K/uL   Eosinophils Relative 1 %   Eosinophils Absolute 0.1 0.0 - 0.5 K/uL   Basophils Relative 0 %   Basophils Absolute 0.0 0.0 - 0.1 K/uL   Immature Granulocytes 1 %   Abs Immature Granulocytes 0.06 0.00 - 0.07 K/uL    Comment: Performed at [facility] [address] [place], [place] [address]  Brain natriuretic peptide     Status: Abnormal   Collection Time: 04/20/21  1:48 PM  Result Value Ref Range   B Natriuretic Peptide 1,460.7 (H) 0.0 - 100.0 pg/mL    Comment: Performed at [facility]  Lab, [address], [place], [place] [address]  Troponin I (High Sensitivity)     Status: Abnormal   Collection Time: 04/20/21  1:48 PM  Result Value Ref Range   Troponin I (High Sensitivity) 93 (H) <18 ng/L    Comment: (NOTE) Elevated high sensitivity troponin I (hsTnI) values and significant  changes across serial measurements may suggest ACS but many other  chronic and acute  conditions are known to elevate hsTnI results.  Refer to the "Links" section for chest pain algorithms and additional  guidance. Performed at [facility], [place] [address] [place], [place] [address]     ECG   A. fib with ventricular pacing at 61- Personally Reviewed  Telemetry   A. fib with ventricular pacing- Personally Reviewed  Radiology   DG Chest 2 View  Result Date: 04/20/2021 CLINICAL DATA:  Shortness of breath. EXAM: CHEST - 2 VIEW COMPARISON:  Chest x-rays dated 04/02/2021 and 09/21/2016. FINDINGS: Study is hypoinspiratory with crowding of the perihilar and bibasilar bronchovascular markings. Given the slightly low lung volumes, lungs are clear. No confluent opacity to suggest a developing pneumonia. No pleural effusion or pneumothorax is seen. Stable cardiomegaly. LEFT chest wall pacemaker/ICD apparatus in place. Osseous structures about the chest are unremarkable. IMPRESSION: Low lung volumes. No active cardiopulmonary disease. No evidence of pneumonia or pulmonary edema. Cardiomegaly. Electronically Signed   By: Franki Cabot M.D.   On: 04/20/2021 14:00    Cardiac Studies   N/A  Impression   Principal Problem:   Acute on chronic combined systolic and diastolic CHF (congestive heart failure) (HCC) Active Problems:   Diabetes mellitus with chronic kidney disease ([place])   Carotid artery disease (HCC)   Essential hypertension   Persistent atrial fibrillation (HCC)   Hypokalemia   Recommendation   Acute on chronic combined systolic and diastolic congestive heart failure -with predominant right heart failure symptoms Mr. Drummond presents with 20 to 25 pound weight gain and progressive dyspnea on exertion over the past several weeks and has notable lower extremity edema and increased abdominal girth with fluid wave concerning for ascites.  I am concerned he has developed right ventricular probable biventricular heart failure.  He has made little urine with his  initial dose of IV Lasix.  I would advise an additional 40 mg and continuing 80 mg IV twice daily given his reduced GFR.  He already has a very high BUN and and it may likely be third spacing.  Recommend placing a PICC line and obtaining Co. oximetry panels.  He may need inotropic therapy. Echo ordered. Would also recommend abdominal ultrasound for ascites - may need paracentesis. Permanent atrial fibrillation Noted, on anticoagulation, status post pacemaker which is functioning appropriately. Status post permanent pacemaker V pacing 100% of the time -question pacemaker related cardiomyopathy Hypertension Appears well-controlled CKD 3A Creatinine around 2- GFR 31. Monitor with diuresis.  DNR -he does not want resuscitation but does want aggressive medical therapy if it can improve his quality of life  Thanks for the consultation. Cardiology will follow closely with you.  Time Spent Directly with Patient:  I have spent a total of 45 minutes with the patient reviewing hospital notes, telemetry, EKGs, labs and examining the patient as well as establishing an assessment and plan that was discussed personally with the patient.  > 50% of time was spent in direct patient care.  Length of Stay:  LOS: 0 days   Pixie Casino, MD, [facility], [place]  [facility] Diplomate of the American Board of Clinical Lipidology Attending Cardiologist  Direct Dial: [phone]   Fax: [phone]  Website:  [email or web address] 04/20/2021, 4:33 PM

## 2021-04-20 NOTE — ED Triage Notes (Signed)
Patient states he was sent to [facility] by nephrologist for evaluation of shortness of breath that started three weeks ago. Patient reports shortness of breath and urinary frequency. Denies pain. Patient is alert, oriented, and dyspneic at rest.

## 2021-04-20 NOTE — Assessment & Plan Note (Addendum)
Uncontrolled hyperglycemia At home patient on sitagliptin and glipizide.   Fasting glucose this am is 179, will continue close glucose cover and monitoring, he is tolerating po well.

## 2021-04-20 NOTE — Assessment & Plan Note (Addendum)
SP paracentesis for ascites related heart failure. Echocardiogram with LV EF 40 to 48%, RV systolic function with mild reduction, moderate elevation in systolic pulmonary pressure. Mild to moderate tricuspid valve regurgitation.  Patient required inotropic support with milrinone and paracentesis.    Patient with negative fluid balance since admission 18,724 ml with improvement of his symptoms.   His urine output over last 24 hrs is 4,100 ml.   Heart failure therapy with empagliflozin.  Diuretic therapy on hold for today.  Add further guideline directed therapy in the next 24 hrs.

## 2021-04-20 NOTE — ED Provider Notes (Signed)
I saw and evaluated the patient, reviewed the resident's note and I agree with the findings and plan.  Pertinent History: This patient is an 88 year old male, presents with significant fluid overload of his bilateral legs with anasarca extending all the way up to his chest wall, he has had a significant weight gain recently, no fevers or chills  Pertinent Exam findings: No JVD however the patient does have significant symmetrical pitting edema of his bilateral lower extremities with anasarca extending up onto his chest wall, lungs are clear, heart is regular, pacemaker present, no redness or tenderness over the pacemaker.  He does have stasis dermatitis of his bilateral shin area.  I was personally present and directly supervised the following procedures:  Medical evaluation and resuscitation  Intravenous Lasix, laboratory work-up consistent with patient's history of chronic renal failure, troponin likely secondary to chronic renal failure as he has had no chest pain whatsoever.  He is also in congestive heart failure based on BNP, will discuss with hospitalist for admission.  He was sent here from nephrology for admission due to significant fluid overload   EKG Interpretation  Date/Time:  Friday April 20 2021 13:03:29 EST Ventricular Rate:  61 PR Interval:    QRS Duration: 190 QT Interval:  538 QTC Calculation: 541 R Axis:   221 Text Interpretation: Ventricular-paced rhythm Abnormal ECG When compared with ECG of 21-Sep-2016 02:11, PREVIOUS ECG IS PRESENT Confirmed by Noemi Chapel [phone]) on 04/20/2021 3:42:19 PM        I personally interpreted the EKG as well as the resident and agree with the interpretation on the resident's chart.  Final diagnoses:  None      Noemi Chapel, MD 04/22/21 [phone]

## 2021-04-20 NOTE — Assessment & Plan Note (Addendum)
At home on metoprolol 25 mg succinate,  Rate continue controlled range of 58 to 61 bpm today. Continue anticoagulation with apixaban and continue telemetry monitoring.

## 2021-04-20 NOTE — Assessment & Plan Note (Deleted)
-   See discussion above, s/p paracentesis, -Suspect this is all related to right heart failure, he does not have history of other known liver disease or alcoholism, abdominal ultrasound 6/22 noted fatty liver

## 2021-04-20 NOTE — ED Provider Triage Note (Signed)
Emergency Medicine Provider Triage Evaluation Note  Vincent Allen , a 88 y.o. male  was evaluated in triage.  Pt complains of shortness of breath onset 3 weeks.  Per patient he was sent to the ED by his nephrologist, Dr. Hollie Salk due to his symptoms.  Has not tried a medication for symptoms.  Denies chest pain, abdominal pain, nausea, vomiting.   Review of Systems  Positive: As per HPI above Negative: Chest pain  Physical Exam  BP 128/81 (BP Location: Left Arm)    Pulse 65    Temp 98.7 F (37.1 C)    Resp (!) 24    SpO2 100%  Gen:   Awake, no distress  Resp:  Normal effort  MSK:   Moves extremities without difficulty  Other:  2+ pitting edema noted to bilateral lower extremities with edema extending above the level of the knees.  No chest wall tenderness to palpation.  Medical Decision Making  Medically screening exam initiated at 1:37 PM.  Appropriate orders placed.  [name] was informed that the remainder of the evaluation will be completed by another provider, this initial triage assessment does not replace that evaluation, and the importance of remaining in the ED until their evaluation is complete.  1:45 PM - Discussed with RN that patient is in need of a room immediately. RN aware and working on room placement.     Devan Danzer A, PA-C 04/20/21 1345

## 2021-04-20 NOTE — Assessment & Plan Note (Signed)
V-pacing, follow cards recs

## 2021-04-20 NOTE — H&P (Signed)
History and Physical    Patient: Vincent Allen GGY:[id number] DOB: 12-Mar-1934 DOA: 04/20/2021 DOS: the patient was seen and examined on 04/20/2021 PCP: [name], MD  Patient coming from: Home - lives alone. Still drives, uses cane    Chief Complaint: dyspnea on exertion/weight gain   HPI: Vincent Allen is a 88 y.o. male with medical history significant of CKD stage 3, T2DM, GERD, HTN, HLD, persistent Afib on eliquis, symptomatic bradycardia s/p PPM presenting with 3 week history of worsening shortness of breath with exertion, 20-25 pound weight gain. He saw his nephrologist and she advised him to come to ED. He denies any orthopnea, has a dry cough. He states he has fluid "everywhere." Has not skipped any medication.   Denies any fever/chills, headaches or dizziness, chest pain or palpitations, stomach pain, N//v/D, denies any dysuria, but has had increased urinary frequency with decreased output.  Weight in office at cardiologist in 10/2020 was 220#.   ER Course:  vitals: afebrile, bp: 128/81, HR: 65, RR: 16, oxygen: 100%RA Pertinent labs: hgb: 11.9, potassium: 3.2, BUN: 75, creatinine: 2.08, BNP: 1460, troponin 93> CXR: no opacity or pulmonary edema.  In ED: given lasix 40mg  and potassium.   Review of Systems: As mentioned in the history of present illness. All other systems reviewed and are negative. Past Medical History:  Diagnosis Date   Arthritis    Bradycardia    Cataract    Chronic kidney disease stage III (GFR 30-59 ml/min) 05/03/2012   Colon polyps    Complication of anesthesia    emesis   Diabetes mellitus    GERD (gastroesophageal reflux disease)    HOH (hard of hearing)    HTN (hypertension) 01/13/2013   Hyperlipidemia    Hypertension    Pacemaker 12/2016   Persistent atrial fibrillation ([place])    Presence of permanent cardiac pacemaker 09/20/2016   Symptomatic bradycardia    Vitamin D deficiency 07/17/2015   Past Surgical History:  Procedure  Laterality Date   ANKLE ARTHROPLASTY     CARDIOVERSION N/A 08/20/2016   Procedure: CARDIOVERSION;  Surgeon: Sanda Klein, MD;  Location: [facility];  Service: Cardiovascular;  Laterality: N/A;   CATARACT EXTRACTION  2010, 2014   CATARACT EXTRACTION  02/28/14   CHOLECYSTECTOMY     CHOLECYSTECTOMY N/A 05/03/2012   Procedure: LAPAROSCOPIC CHOLECYSTECTOMY WITH INTRAOPERATIVE CHOLANGIOGRAM;  Surgeon: [name], MD;  Location: [facility];  Service: General;  Laterality: N/A;   INSERT / REPLACE / REMOVE PACEMAKER  09/20/2016   PACEMAKER IMPLANT N/A 09/20/2016   Procedure: Pacemaker Implant;  Surgeon: Deboraha Sprang, MD;  Location: [facility];  Service: Cardiovascular;  Laterality: N/A;   ROTATOR CUFF REPAIR     SHOULDER SURGERY     TONSILLECTOMY     Social History:  reports that he has never smoked. He has never used smokeless tobacco. He reports current alcohol use of about 1.0 standard drink per week. He reports that he does not use drugs.  Allergies  Allergen Reactions   Atorvastatin     Myalgias / leg pain and weakness   Penicillins Hives and Itching    Has patient had a PCN reaction causing immediate rash, facial/tongue/throat swelling, SOB or lightheadedness with hypotension:Yes Has patient had a PCN reaction causing severe rash involving mucus membranes or skin necrosis:No Has patient had a PCN reaction that required hospitalization:No Has patient had a PCN reaction occurring within the last 10 years:No If all of the above answers are "  NO", then may proceed with Cephalosporin use.    Verapamil     Junctional rhythm   Influenza Vaccines Rash    tinnitus    Family History  Problem Relation Age of Onset   Cancer Mother        colon, breast, pancreas, skin cancer   Heart disease Father        heart valve replaced   Stroke Father    Diabetes Father    Cancer Paternal Grandmother        colon   Obesity Son    Heart disease Son        bradycardia    Prior to  Admission medications   Medication Sig Start Date End Date Taking? Authorizing Provider  amLODipine (NORVASC) 5 MG tablet TAKE 1 TABLET BY MOUTH EVERY DAY Patient taking differently: Take 5 mg by mouth daily. 12/19/20  Yes [name], MD  ELIQUIS 2.5 MG TABS tablet TAKE 1 TABLET BY MOUTH TWICE A DAY Patient taking differently: Take 2.5 mg by mouth 2 (two) times daily. 10/09/20  Yes [name], MD  glipiZIDE (GLUCOTROL) 10 MG tablet Take 0.5 tablets (5 mg total) by mouth 2 (two) times daily before a meal. 04/02/21  Yes [name], MD  metoprolol succinate (TOPROL-XL) 25 MG 24 hr tablet Take 1 tablet (25 mg total) by mouth in the morning and at bedtime. 04/16/21  Yes [name], MD  Multiple Vitamin (MULTIVITAMIN WITH MINERALS) TABS tablet Take 1 tablet by mouth daily.   Yes [provider]  sitaGLIPtin (JANUVIA) 50 MG tablet Take 25 mg by mouth 2 (two) times daily. 04/16/21  Yes [name], MD  Calcium 600 MG tablet Take 1 tablet (600 mg total) by mouth 2 (two) times daily. 01/30/11   Burnice Logan, MD  finasteride (PROSCAR) 5 MG tablet Take 5 mg by mouth daily. 02/19/21   [provider]  furosemide (LASIX) 20 MG tablet TAKE 1-2 TABLETS BY MOUTH DAILY AS NEEDED FOR EDEMA (WEIGHT GAIN >3LBS IN 24 HRS, SOB). Patient taking differently: Take 20 mg by mouth 2 (two) times daily. 02/26/21   [name], MD  glucose blood (ONE TOUCH ULTRA TEST) test strip USE 1 STRIP 2 TIMES DAILY TO CHECK BLOOD SUGAR DX E08.22, N18.3 11/03/18   [name], MD  Krill Oil 1000 MG CAPS Take 1 capsule by mouth daily.    [provider]  mupirocin ointment (BACTROBAN) 2 % Apply topically daily. 10/30/20   [provider]  silodosin (RAPAFLO) 4 MG CAPS capsule Take 4 mg by mouth daily. 01/09/21   [provider]  triamcinolone cream (KENALOG) 0.1 % APPLY TO AFFECTED AREA TWICE A DAY 02/26/21   [name], MD  triamterene-hydrochlorothiazide  (MAXZIDE) 75-50 MG tablet TAKE 1 TABLET BY MOUTH EVERY DAY Patient not taking: Reported on 04/20/2021 11/27/20   [name], MD  Vitamin D, Cholecalciferol, 25 MCG (1000 UT) CAPS Take 1 capsule by mouth daily.    [provider]  Zinc 50 MG TABS Take 1 tablet by mouth daily.    [provider]    Physical Exam: Vitals:   04/20/21 1630 04/20/21 1635 04/20/21 1720 04/20/21 1952  BP: 120/66  133/64 117/66  Pulse: (!) 58 (!) 59 63 60  Resp:   18 20  Temp:   (!) 96.9 F (36.1 C) 97.6 F (36.4 C)  TempSrc:    Oral  SpO2: 97% 97% 100%  100%  Weight:   111.7 kg   Height:   6' (1.829 m)    General:  Appears calm and comfortable and is in NAD Eyes:  PERRL, EOMI, normal lids, iris ENT:  hard of hearing, lips & tongue, mmm; appropriate dentition Neck:  no LAD, masses or thyromegaly;  no carotid bruits. + JVD Cardiovascular:  RRR, no m/r/g. 3+ edema to abdomen.  Respiratory:   CTA bilaterally with no wheezes/rales/rhonchi.  Normal respiratory effort. Abdomen:  soft, distended with fluid. BS+. No rebound or guarding  Back:   normal alignment, no CVAT Skin:  no rash or induration seen on limited exam. Bilateral LE edema with vesicles chronic venous stasis  Musculoskeletal:  grossly normal tone BUE/BLE, good ROM, no bony abnormality Lower extremity:   Limited foot exam with no ulcerations.  2+ distal pulses. Psychiatric:  grossly normal mood and affect, speech fluent and appropriate, AOx3 Neurologic:  CN 2-12 grossly intact, moves all extremities in coordinated fashion, sensation intact   Radiological Exams on Admission: Independently reviewed - see discussion in A/P where applicable  DG Chest 2 View  Result Date: 04/20/2021 CLINICAL DATA:  Shortness of breath. EXAM: CHEST - 2 VIEW COMPARISON:  Chest x-rays dated 04/02/2021 and 09/21/2016. FINDINGS: Study is hypoinspiratory with crowding of the perihilar and bibasilar bronchovascular markings. Given the slightly low lung  volumes, lungs are clear. No confluent opacity to suggest a developing pneumonia. No pleural effusion or pneumothorax is seen. Stable cardiomegaly. LEFT chest wall pacemaker/ICD apparatus in place. Osseous structures about the chest are unremarkable. IMPRESSION: Low lung volumes. No active cardiopulmonary disease. No evidence of pneumonia or pulmonary edema. Cardiomegaly. Electronically Signed   By: Franki Cabot M.D.   On: 04/20/2021 14:00    EKG: Independently reviewed.  NSR with rate V-paced at 61bpm; nonspecific ST changes with no evidence of acute ischemia   Labs on Admission: I have personally reviewed the available labs and imaging studies at the time of the admission.  Pertinent labs:   hgb: 11.9,  potassium: 3.2,  BUN: 75,  creatinine: 2.08,  BNP: 1460,  troponin 83>    Assessment and Plan: * Acute on chronic combined systolic and diastolic CHF (congestive heart failure) ([place])- (present on admission) 88 year old with 3 week history of worsening dyspnea on exertion, edema and weight gain found to be in acute on chronic combined CHF. BNP elevated >1000.  Concern for RV failure with extensive edema and ascites -admit to telemetry -cardiology consulted-dr. Hilty -echo ordered -US ascites ordered. May need paracentesis, stable at this time -strict intake/output -daily weights. Weight in clinic in august was 220, up to 244 today  -received 40mg  IV lasix in ED with no output. Will give an additional 40mg  tonight after discussing with cardiology. Follow output, but with renal function concern may need inotrope.  -f/u on cardiology recs appreciate assistance   Acute renal failure superimposed on stage 3a chronic kidney disease ([place]) Baseline creatinine appears to be around 1.6-1.8 Over past 2 weeks has been 2.1-2.3 Concern for cardio-renal in setting of CHF exac Will follow closely with diureses Cardiology following, may need nephrology   Ascites Concern for RV heart  failure Checking ascites [place] No concerns for SBP at this time  Depending on how well he diureses, may need IR F/u on ultrasound  Hypokalemia- (present on admission) Check magnesium Given 42meq in ED Trend bmp  Will likely need replacement with aggressive diuresis   Essential hypertension- (present on admission) Well controlled.  Continue toprol, hold norvasc in setting of CHF exacerbation   Elevated troponin 83, delta pending Likely demand ischemia in setting of CHF exacerbation No chest pain, no ekg changes concerning for acs Continue to monitor   Diabetes mellitus with chronic kidney disease ([place])- (present on admission) a1c of 5.8 in 03/2021, tightly controlled for an 88 year old Hold oral drug Sensitive SSI and accuchecks per protocol   Cardiac device in situ- (present on admission) V-pacing, follow cards recs   Persistent atrial fibrillation ([place])- (present on admission) V-paced rhythm  Continue eliquis and toprol    Advance Care Planning:   Code Status: DNR DNR   Consults: cardiology: Dr. Debara Pickett   DVT Prophylaxis: eliquis   Family Communication: called daughter: Henry Russel   Severity of Illness: The appropriate patient status for this patient is OBSERVATION. Observation status is judged to be reasonable and necessary in order to provide the required intensity of service to ensure the patient's safety. The patient's presenting symptoms, physical exam findings, and initial radiographic and laboratory data in the context of their medical condition is felt to place them at decreased risk for further clinical deterioration. Furthermore, it is anticipated that the patient will be medically stable for discharge from the hospital within 2 midnights of admission.   Author: Orma Flaming, MD 04/20/2021 9:20 PM  For on call review [email or web address].

## 2021-04-21 ENCOUNTER — Observation Stay (HOSPITAL_COMMUNITY): Payer: PPO

## 2021-04-21 ENCOUNTER — Other Ambulatory Visit: Payer: Self-pay | Admitting: Family Medicine

## 2021-04-21 ENCOUNTER — Observation Stay: Payer: Self-pay

## 2021-04-21 DIAGNOSIS — Z833 Family history of diabetes mellitus: Secondary | ICD-10-CM | POA: Diagnosis not present

## 2021-04-21 DIAGNOSIS — I10 Essential (primary) hypertension: Secondary | ICD-10-CM | POA: Diagnosis not present

## 2021-04-21 DIAGNOSIS — E1122 Type 2 diabetes mellitus with diabetic chronic kidney disease: Secondary | ICD-10-CM | POA: Diagnosis not present

## 2021-04-21 DIAGNOSIS — I50811 Acute right heart failure: Secondary | ICD-10-CM | POA: Diagnosis not present

## 2021-04-21 DIAGNOSIS — Z959 Presence of cardiac and vascular implant and graft, unspecified: Secondary | ICD-10-CM | POA: Diagnosis not present

## 2021-04-21 DIAGNOSIS — E871 Hypo-osmolality and hyponatremia: Secondary | ICD-10-CM | POA: Diagnosis not present

## 2021-04-21 DIAGNOSIS — Z8249 Family history of ischemic heart disease and other diseases of the circulatory system: Secondary | ICD-10-CM | POA: Diagnosis not present

## 2021-04-21 DIAGNOSIS — N1831 Chronic kidney disease, stage 3a: Secondary | ICD-10-CM | POA: Diagnosis not present

## 2021-04-21 DIAGNOSIS — I517 Cardiomegaly: Secondary | ICD-10-CM | POA: Diagnosis not present

## 2021-04-21 DIAGNOSIS — J9811 Atelectasis: Secondary | ICD-10-CM | POA: Diagnosis not present

## 2021-04-21 DIAGNOSIS — Z20822 Contact with and (suspected) exposure to covid-19: Secondary | ICD-10-CM | POA: Diagnosis not present

## 2021-04-21 DIAGNOSIS — I5033 Acute on chronic diastolic (congestive) heart failure: Secondary | ICD-10-CM | POA: Insufficient documentation

## 2021-04-21 DIAGNOSIS — E876 Hypokalemia: Secondary | ICD-10-CM | POA: Diagnosis present

## 2021-04-21 DIAGNOSIS — I472 Ventricular tachycardia, unspecified: Secondary | ICD-10-CM | POA: Diagnosis not present

## 2021-04-21 DIAGNOSIS — I130 Hypertensive heart and chronic kidney disease with heart failure and stage 1 through stage 4 chronic kidney disease, or unspecified chronic kidney disease: Secondary | ICD-10-CM | POA: Diagnosis not present

## 2021-04-21 DIAGNOSIS — I4819 Other persistent atrial fibrillation: Secondary | ICD-10-CM | POA: Diagnosis not present

## 2021-04-21 DIAGNOSIS — N179 Acute kidney failure, unspecified: Secondary | ICD-10-CM | POA: Diagnosis present

## 2021-04-21 DIAGNOSIS — E0822 Diabetes mellitus due to underlying condition with diabetic chronic kidney disease: Secondary | ICD-10-CM | POA: Diagnosis not present

## 2021-04-21 DIAGNOSIS — Z66 Do not resuscitate: Secondary | ICD-10-CM | POA: Diagnosis not present

## 2021-04-21 DIAGNOSIS — R778 Other specified abnormalities of plasma proteins: Secondary | ICD-10-CM | POA: Diagnosis not present

## 2021-04-21 DIAGNOSIS — I5082 Biventricular heart failure: Secondary | ICD-10-CM | POA: Diagnosis present

## 2021-04-21 DIAGNOSIS — R188 Other ascites: Secondary | ICD-10-CM | POA: Diagnosis not present

## 2021-04-21 DIAGNOSIS — N1832 Chronic kidney disease, stage 3b: Secondary | ICD-10-CM | POA: Diagnosis present

## 2021-04-21 DIAGNOSIS — I5043 Acute on chronic combined systolic (congestive) and diastolic (congestive) heart failure: Secondary | ICD-10-CM | POA: Diagnosis not present

## 2021-04-21 DIAGNOSIS — E559 Vitamin D deficiency, unspecified: Secondary | ICD-10-CM | POA: Diagnosis present

## 2021-04-21 DIAGNOSIS — Z7901 Long term (current) use of anticoagulants: Secondary | ICD-10-CM | POA: Diagnosis not present

## 2021-04-21 DIAGNOSIS — E440 Moderate protein-calorie malnutrition: Secondary | ICD-10-CM | POA: Diagnosis not present

## 2021-04-21 DIAGNOSIS — I495 Sick sinus syndrome: Secondary | ICD-10-CM | POA: Diagnosis not present

## 2021-04-21 DIAGNOSIS — E785 Hyperlipidemia, unspecified: Secondary | ICD-10-CM | POA: Diagnosis present

## 2021-04-21 DIAGNOSIS — N183 Chronic kidney disease, stage 3 unspecified: Secondary | ICD-10-CM | POA: Diagnosis not present

## 2021-04-21 DIAGNOSIS — N170 Acute kidney failure with tubular necrosis: Secondary | ICD-10-CM | POA: Diagnosis not present

## 2021-04-21 DIAGNOSIS — E669 Obesity, unspecified: Secondary | ICD-10-CM | POA: Diagnosis not present

## 2021-04-21 DIAGNOSIS — K219 Gastro-esophageal reflux disease without esophagitis: Secondary | ICD-10-CM | POA: Diagnosis present

## 2021-04-21 DIAGNOSIS — Z808 Family history of malignant neoplasm of other organs or systems: Secondary | ICD-10-CM | POA: Diagnosis not present

## 2021-04-21 DIAGNOSIS — I5031 Acute diastolic (congestive) heart failure: Secondary | ICD-10-CM | POA: Diagnosis not present

## 2021-04-21 DIAGNOSIS — R0602 Shortness of breath: Secondary | ICD-10-CM | POA: Diagnosis present

## 2021-04-21 LAB — CBC
HCT: 33.3 % — ABNORMAL LOW (ref 39.0–52.0)
Hemoglobin: 10.6 g/dL — ABNORMAL LOW (ref 13.0–17.0)
MCH: 27.1 pg (ref 26.0–34.0)
MCHC: 31.8 g/dL (ref 30.0–36.0)
MCV: 85.2 fL (ref 80.0–100.0)
Platelets: 198 10*3/uL (ref 150–400)
RBC: 3.91 MIL/uL — ABNORMAL LOW (ref 4.22–5.81)
RDW: 14.6 % (ref 11.5–15.5)
WBC: 7.6 10*3/uL (ref 4.0–10.5)
nRBC: 0 % (ref 0.0–0.2)

## 2021-04-21 LAB — BASIC METABOLIC PANEL
Anion gap: 12 (ref 5–15)
BUN: 76 mg/dL — ABNORMAL HIGH (ref 8–23)
CO2: 22 mmol/L (ref 22–32)
Calcium: 8.7 mg/dL — ABNORMAL LOW (ref 8.9–10.3)
Chloride: 102 mmol/L (ref 98–111)
Creatinine, Ser: 2.03 mg/dL — ABNORMAL HIGH (ref 0.61–1.24)
GFR, Estimated: 32 mL/min — ABNORMAL LOW (ref >60)
Glucose, Bld: 130 mg/dL — ABNORMAL HIGH (ref 70–99)
Potassium: 3.2 mmol/L — ABNORMAL LOW (ref 3.5–5.1)
Sodium: 136 mmol/L (ref 135–145)

## 2021-04-21 LAB — GLUCOSE, CAPILLARY
Glucose-Capillary: 129 mg/dL — ABNORMAL HIGH (ref 70–99)
Glucose-Capillary: 156 mg/dL — ABNORMAL HIGH (ref 70–99)
Glucose-Capillary: 163 mg/dL — ABNORMAL HIGH (ref 70–99)
Glucose-Capillary: 159 mg/dL — ABNORMAL HIGH (ref 70–99)

## 2021-04-21 LAB — ALBUMIN, PLEURAL OR PERITONEAL FLUID: Albumin, Fluid: 1.5 g/dL

## 2021-04-21 LAB — PROTEIN, PLEURAL OR PERITONEAL FLUID: Total protein, fluid: 3.2 g/dL

## 2021-04-21 LAB — GRAM STAIN

## 2021-04-21 LAB — COOXEMETRY PANEL
Carboxyhemoglobin: 1.5 % (ref 0.5–1.5)
Methemoglobin: 0.8 % (ref 0.0–1.5)
O2 Saturation: 75.9 %
Total hemoglobin: 10.7 g/dL — ABNORMAL LOW (ref 12.0–16.0)

## 2021-04-21 MED ORDER — METOLAZONE 2.5 MG PO TABS
2.5000 mg | ORAL_TABLET | Freq: Once | ORAL | Status: AC
  Administered 2021-04-21: 2.5 mg via ORAL
  Filled 2021-04-21: qty 1

## 2021-04-21 MED ORDER — POTASSIUM CHLORIDE CRYS ER 20 MEQ PO TBCR
40.0000 meq | EXTENDED_RELEASE_TABLET | Freq: Once | ORAL | Status: AC
  Administered 2021-04-21: 40 meq via ORAL
  Filled 2021-04-21: qty 2

## 2021-04-21 MED ORDER — ALBUMIN HUMAN 5 % IV SOLN
25.0000 g | Freq: Once | INTRAVENOUS | Status: AC
  Administered 2021-04-21: 25 g via INTRAVENOUS
  Filled 2021-04-21: qty 500

## 2021-04-21 MED ORDER — CHLORHEXIDINE GLUCONATE CLOTH 2 % EX PADS
6.0000 | MEDICATED_PAD | Freq: Every day | CUTANEOUS | Status: DC
  Administered 2021-04-21 – 2021-05-03 (×13): 6 via TOPICAL

## 2021-04-21 MED ORDER — LIDOCAINE HCL (PF) 1 % IJ SOLN
INTRAMUSCULAR | Status: AC
  Administered 2021-04-21: 10 mL via INTRAMUSCULAR
  Filled 2021-04-21: qty 30

## 2021-04-21 MED ORDER — SODIUM CHLORIDE 0.9% FLUSH
10.0000 mL | Freq: Two times a day (BID) | INTRAVENOUS | Status: DC
  Administered 2021-04-21 – 2021-05-03 (×16): 10 mL

## 2021-04-21 NOTE — Progress Notes (Signed)
° °  [place] reviewed and looks good at 76%. Not suggesting a role for inotropes at this point. Had 5L paracentesis performed - there is likely more flluid. Studies sent on the fluid. Repeat echo is pending - continue diuresis. Will give metolazone 2.5 mg x 1 now to augment diuresis.  Pixie Casino, MD, [facility], [place] [employer] Director of the [facility] Lipid [facility]  Cardiovascular [facility] Reduction [facility] Diplomate of the American Board of Clinical Lipidology Attending Cardiologist  Direct Dial: [phone]   Fax: [phone]  Website:  [email or web address]

## 2021-04-21 NOTE — Progress Notes (Signed)
PROGRESS NOTE    Vincent Allen  FYB:[id number] DOB: 05/20/1933 DOA: 04/20/2021 PCP: [name], MD  Brief Narrative:Vincent Allen is a 88 y.o. male with medical history significant of CKD stage 3, T2DM, GERD, HTN, HLD, persistent Afib on eliquis, symptomatic bradycardia s/p PPM presenting with 3 week history of worsening shortness of breath with exertion, 20-25 pound weight gain In the ED his creatinine 2.0, BNP 1460, troponin 93, chest x-ray was unremarkable  Subjective: -Feels lousy, tired, short of breath and abdominal distention  Assessment and Plan: * Acute on chronic combined systolic and diastolic CHF (congestive heart failure) ([place])- (present on admission) Moderate MR, right heart failure -last echo with EF 40-45%, moderate MR  -Poor urine output yesterday, Lasix increased to 80 Mg twice daily -IR consulted for paracentesis, albumin x1 -Monitor urine output, kidney function -May need inotropic support if response remains poor -Appreciate cardiology input  Elevated troponin- (present on admission) Mildly elevated with flat trend, do not suspect ACS -Likely demand ischemia in the setting of CHF  Ascites- (present on admission) - See discussion above, IR consulted for paracentesis  Acute renal failure superimposed on stage 3a chronic kidney disease ([place])- (present on admission) Cardiorenal syndrome  -baseline creatinine appears to be around 1.6-1.8 -Now 2.0 from 2.3 yesterday, monitor with diuresis,  see discussion above  Persistent atrial fibrillation ([place])- (present on admission) - Stable, continue Toprol and Eliquis  Diabetes mellitus with chronic kidney disease ([place])- (present on admission) a1c of 5.8 in 03/2021 -CBG stable, continue sliding scale insulin -Glucotrol and Januvia on hold  Hypokalemia- (present on admission) Replaced  Cardiac device in situ- (present on admission) V-pacing, follow cards recs   Essential hypertension- (present on  admission) - BP stable, continue Toprol, amlodipine on hold -IV Lasix as above  DVT prophylaxis:eliquis Code Status: DNR Family Communication: Discussed with patient in detail, no family at bedside Disposition Plan:   Consultants:  cards  Procedures:   Antimicrobials:    Objective: Vitals:   04/20/21 2345 04/21/21 0225 04/21/21 0345 04/21/21 0710  BP: 121/62  122/71 116/61  Pulse: 63  69 (!) 59  Resp: 20  16 16   Temp: 97.8 F (36.6 C)  98 F (36.7 C) (!) 97.4 F (36.3 C)  TempSrc: Oral  Oral Oral  SpO2: 100%  98% 99%  Weight:  111.1 kg    Height:        Intake/Output Summary (Last 24 hours) at 04/21/2021 1027 Last data filed at 04/21/2021 0757 Gross per 24 hour  Intake 483 ml  Output 800 ml  Net -317 ml   Filed Weights   04/20/21 1720 04/21/21 0225  Weight: 111.7 kg 111.1 kg    Examination:  General exam: Appears calm and comfortable  Respiratory system: Clear to auscultation Cardiovascular system: S1 & S2 heard, RRR.  Abd: nondistended, soft and nontender.Normal bowel sounds heard. Central nervous system: Alert and oriented. No focal neurological deficits. Extremities: no edema Skin: No rashes Psychiatry:  Mood & affect appropriate.     Data Reviewed:   CBC: Recent Labs  Lab 04/20/21 1348 04/21/21 0335  WBC 10.2 7.6  NEUTROABS 7.3  --   HGB 11.9* 10.6*  HCT 37.0* 33.3*  MCV 85.6 85.2  PLT 233 527   Basic Metabolic Panel: Recent Labs  Lab 04/20/21 1254 04/20/21 1348 04/21/21 0335  NA  --  135 136  K  --  3.2* 3.2*  CL  --  99 102  CO2  --  21* 22  GLUCOSE  --  168* 130*  BUN  --  75* 76*  CREATININE  --  2.08* 2.03*  CALCIUM  --  9.3 8.7*  MG 2.0  --   --    GFR: Estimated Creatinine Clearance: 34.2 mL/min (A) (by C-G formula based on SCr of 2.03 mg/dL (H)). Liver Function Tests: Recent Labs  Lab 04/20/21 1810  AST 23  ALT 17  ALKPHOS 65  BILITOT 1.4*  PROT 6.5  ALBUMIN 2.6*   No results for input(s): LIPASE, AMYLASE in  the last 168 hours. No results for input(s): AMMONIA in the last 168 hours. Coagulation Profile: No results for input(s): INR, PROTIME in the last 168 hours. Cardiac Enzymes: No results for input(s): CKTOTAL, CKMB, CKMBINDEX, TROPONINI in the last 168 hours. BNP (last 3 results) No results for input(s): PROBNP in the last 8760 hours. HbA1C: No results for input(s): HGBA1C in the last 72 hours. CBG: Recent Labs  Lab 04/20/21 1734 04/20/21 2058 04/21/21 0550  GLUCAP 170* 190* 129*   Lipid Profile: No results for input(s): CHOL, HDL, LDLCALC, TRIG, CHOLHDL, LDLDIRECT in the last 72 hours. Thyroid Function Tests: No results for input(s): TSH, T4TOTAL, FREET4, T3FREE, THYROIDAB in the last 72 hours. Anemia Panel: No results for input(s): VITAMINB12, FOLATE, FERRITIN, TIBC, IRON, RETICCTPCT in the last 72 hours. Urine analysis:    Component Value Date/Time   COLORURINE YELLOW 04/20/2021 2041   APPEARANCEUR CLEAR 04/20/2021 2041   LABSPEC 1.020 04/20/2021 2041   PHURINE 5.5 04/20/2021 2041   GLUCOSEU NEGATIVE 04/20/2021 2041   GLUCOSEU NEGATIVE 08/10/2020 1513   HGBUR NEGATIVE 04/20/2021 2041   BILIRUBINUR NEGATIVE 04/20/2021 2041   BILIRUBINUR Negative 04/02/2021 1605   KETONESUR NEGATIVE 04/20/2021 2041   PROTEINUR NEGATIVE 04/20/2021 2041   UROBILINOGEN 0.2 04/02/2021 1605   UROBILINOGEN 1.0 08/10/2020 1513   NITRITE NEGATIVE 04/20/2021 2041   LEUKOCYTESUR NEGATIVE 04/20/2021 2041   Sepsis Labs: @LABRCNTIP (procalcitonin:4,lacticidven:4)  ) Recent Results (from the past 240 hour(s))  Resp Panel by RT-PCR (Flu A&B, Covid) Nasopharyngeal Swab     Status: None   Collection Time: 04/20/21  3:30 PM   Specimen: Nasopharyngeal Swab; Nasopharyngeal(NP) swabs in vial transport medium  Result Value Ref Range Status   SARS Coronavirus 2 by RT PCR NEGATIVE NEGATIVE Final    Comment: (NOTE) SARS-CoV-2 target nucleic acids are NOT DETECTED.  The SARS-CoV-2 RNA is generally  detectable in upper respiratory specimens during the acute phase of infection. The lowest concentration of SARS-CoV-2 viral copies this assay can detect is 138 copies/mL. A negative result does not preclude SARS-Cov-2 infection and should not be used as the sole basis for treatment or other patient management decisions. A negative result may occur with  improper specimen collection/handling, submission of specimen other than nasopharyngeal swab, presence of viral mutation(s) within the areas targeted by this assay, and inadequate number of viral copies(<138 copies/mL). A negative result must be combined with clinical observations, patient history, and epidemiological information. The expected result is Negative.  Fact Sheet for Patients:  EntrepreneurPulse.com.au  Fact Sheet for Healthcare Providers:  IncredibleEmployment.be  This test is no t yet approved or cleared by the [place] FDA and  has been authorized for detection and/or diagnosis of SARS-CoV-2 by FDA under an Emergency Use Authorization (EUA). This EUA will remain  in effect (meaning this test can be used) for the duration of the COVID-19 declaration under Section 564(b)(1) of the Act, 21 U.S.C.section 360bbb-3(b)(1), unless the authorization is terminated  or revoked sooner.       Influenza A by PCR NEGATIVE NEGATIVE Final   Influenza B by PCR NEGATIVE NEGATIVE Final    Comment: (NOTE) The Xpert Xpress SARS-CoV-2/FLU/RSV plus assay is intended as an aid in the diagnosis of influenza from Nasopharyngeal swab specimens and should not be used as a sole basis for treatment. Nasal washings and aspirates are unacceptable for Xpert Xpress SARS-CoV-2/FLU/RSV testing.  Fact Sheet for Patients: EntrepreneurPulse.com.au  Fact Sheet for Healthcare Providers: IncredibleEmployment.be  This test is not yet approved or cleared by the [place] FDA  and has been authorized for detection and/or diagnosis of SARS-CoV-2 by FDA under an Emergency Use Authorization (EUA). This EUA will remain in effect (meaning this test can be used) for the duration of the COVID-19 declaration under Section 564(b)(1) of the Act, 21 U.S.C. section 360bbb-3(b)(1), unless the authorization is terminated or revoked.  Performed at [facility], [place] [address] [place], [place] [address]      Radiology Studies: DG Chest 2 View  Result Date: 04/20/2021 CLINICAL DATA:  Shortness of breath. EXAM: CHEST - 2 VIEW COMPARISON:  Chest x-rays dated 04/02/2021 and 09/21/2016. FINDINGS: Study is hypoinspiratory with crowding of the perihilar and bibasilar bronchovascular markings. Given the slightly low lung volumes, lungs are clear. No confluent opacity to suggest a developing pneumonia. No pleural effusion or pneumothorax is seen. Stable cardiomegaly. LEFT chest wall pacemaker/ICD apparatus in place. Osseous structures about the chest are unremarkable. IMPRESSION: Low lung volumes. No active cardiopulmonary disease. No evidence of pneumonia or pulmonary edema. Cardiomegaly. Electronically Signed   By: Franki Cabot M.D.   On: 04/20/2021 14:00   DG CHEST PORT 1 VIEW  Result Date: 04/21/2021 CLINICAL DATA:  Placement of PICC line EXAM: PORTABLE CHEST 1 VIEW COMPARISON:  04/20/2021 FINDINGS: Transverse diameter of heart is increased. Central pulmonary vessels are prominent. There is increase in interstitial markings in the parahilar regions and lower lung fields, possibly suggesting mild interstitial edema. There is no focal pulmonary consolidation. Costophrenic angles are clear. There is no pneumothorax. Pacemaker battery is seen in the left infraclavicular region. There is interval placement of PICC line through the right upper extremity with its tip in the region of superior vena cava. Postsurgical changes are noted in the right shoulder. IMPRESSION: Cardiomegaly. Central  pulmonary vessels are more prominent suggesting CHF. No new focal infiltrates are seen. Tip of PICC line is seen in the course of superior vena cava. Electronically Signed   By: Elmer Picker M.D.   On: 04/21/2021 09:52   [place] ASCITES (ABDOMEN LIMITED)  Result Date: 04/20/2021 CLINICAL DATA:  Ascites, abdominal distention. EXAM: LIMITED ABDOMEN ULTRASOUND FOR ASCITES TECHNIQUE: Limited ultrasound survey for ascites was performed in all four abdominal quadrants. COMPARISON:  09/01/2020. FINDINGS: Moderate to large ascites is noted in all 4 quadrants, the largest pocket in the right upper quadrant measuring up to 15.8 cm. IMPRESSION: Moderate to large ascites. Electronically Signed   By: Brett Fairy M.D.   On: 04/20/2021 22:44   [place] EKG SITE RITE  Result Date: 04/21/2021 If Site Rite image not attached, placement could not be confirmed due to current cardiac rhythm.    Scheduled Meds:  apixaban  2.5 mg Oral BID   Chlorhexidine Gluconate Cloth  6 each Topical Daily   furosemide  80 mg Intravenous BID   insulin aspart  0-9 Units Subcutaneous TID WC   lidocaine (PF)       metoprolol succinate  25  mg Oral BID   sodium chloride flush  3 mL Intravenous Q12H   Continuous Infusions:  sodium chloride     albumin human       LOS: 0 days    Time spent: 56min  Domenic Polite, MD [facility]   04/21/2021, 10:27 AM

## 2021-04-21 NOTE — Procedures (Signed)
Ultrasound-guided diagnostic and therapeutic paracentesis performed yielding 5 liters (maximum ordered) of yellow fluid. No immediate complications. EBL none.

## 2021-04-21 NOTE — Progress Notes (Signed)
Secure chat with Dr. Myrene Buddy, nephrology. Received approval for PICC placement. CrCl 26.

## 2021-04-21 NOTE — Plan of Care (Signed)
°  Problem: Clinical Measurements: Goal: Will remain free from infection Outcome: Progressing Goal: Diagnostic test results will improve Outcome: Progressing Goal: Respiratory complications will improve Outcome: Progressing Goal: Cardiovascular complication will be avoided Outcome: Progressing   Problem: Elimination: Goal: Will not experience complications related to urinary retention Outcome: Progressing   Problem: Pain Managment: Goal: General experience of comfort will improve Outcome: Progressing   Problem: Safety: Goal: Ability to remain free from injury will improve Outcome: Progressing

## 2021-04-21 NOTE — Progress Notes (Signed)
Peripherally Inserted Central Catheter Placement  The IV Nurse has discussed with the patient and/or persons authorized to consent for the patient, the purpose of this procedure and the potential benefits and risks involved with this procedure.  The benefits include less needle sticks, lab draws from the catheter, and the patient may be discharged home with the catheter. Risks include, but not limited to, infection, bleeding, blood clot (thrombus formation), and puncture of an artery; nerve damage and irregular heartbeat and possibility to perform a PICC exchange if needed/ordered by physician.  Alternatives to this procedure were also discussed.  Bard Power PICC patient education guide, fact sheet on infection prevention and patient information card has been provided to patient /or left at bedside.    PICC Placement Documentation  PICC Double Lumen 04/21/21 PICC Right Brachial 39 cm 0 cm (Active)  Indication for Insertion or Continuance of Line Chronic illness with exacerbations (CF, Sickle Cell, etc.) 04/21/21 0916  Exposed Catheter (cm) 0 cm 04/21/21 0916  Site Assessment Clean;Dry;Intact 04/21/21 0916  Lumen #1 Status Saline locked;Flushed;Blood return noted 04/21/21 0916  Lumen #2 Status Flushed;Saline locked;Blood return noted 04/21/21 0916  Dressing Type Transparent;Securing device 04/21/21 0916  Dressing Status Clean;Dry;Intact 04/21/21 0916  Antimicrobial disc in place? Yes 04/21/21 0916  Safety Lock Not Applicable 76/22/63 3354  Line Care Connections checked and tightened 04/21/21 0916  Line Adjustment (NICU/IV Team Only) No 04/21/21 0916  Dressing Intervention New dressing 04/21/21 0916  Dressing Change Due 04/28/21 04/21/21 0916       Rolena Infante 04/21/2021, 9:17 AM

## 2021-04-21 NOTE — Progress Notes (Signed)
DAILY PROGRESS NOTE   Patient Name: Vincent Allen Date of Encounter: 04/21/2021 Cardiologist: Pixie Casino, MD  Chief Complaint   Still feels poorly  Patient Profile   88 yo male patient of mine with a history of chronic systolic heart failure (LVEF 40 to 45%), stage IIIa chronic kidney disease, type 2 diabetes, hypertension, dyslipidemia, persistent atrial fibrillation and symptomatic bradycardia status post pacemaker, presents with progressive shortness of breath and lower extremity edema concerning for heart failure.  Subjective   Vincent Allen reports he had a rough night.  He had ultrasound late last evening which showed large volume ascites.  I have already discussed with the hospitalist about proceeding with a paracentesis today.  Echo is ordered.  PICC line was just placed this morning.  He had a Co. ox yesterday at 66% but this was a peripheral venous measurement.  A repeat   Co-ox will be sent stat from the PICC line.  I suspect right heart failure and will likely need inotropic therapy.  He said he "peed a lot" overnight but output was fairly minimal as not as much as expected with Lasix.  Creatinine stayed very stable.  Blood pressure stable.  He remains in a paced rhythm.  Objective   Vitals:   04/20/21 2345 04/21/21 0225 04/21/21 0345 04/21/21 0710  BP: 121/62  122/71 116/61  Pulse: 63  69 (!) 59  Resp: _0 Temp: 97.8 F (36.6 C)  98 F (36.7 C) (!) 97.4 F (36.3 C)  TempSrc: Oral  Oral Oral  SpO2: 100%  98% 99%  Weight:  111.1 kg    Height:        Intake/Output Summary (Last 24 hours) at 04/21/2021 1018 Last data filed at 04/21/2021 0757 Gross per 24 hour  Intake 483 ml  Output 800 ml  Net -317 ml   Filed Weights   04/20/21 1720 04/21/21 0225  Weight: 111.7 kg 111.1 kg    Physical Exam   General appearance: alert, no distress, and morbidly obese Neck: JVD - several cm above sternal notch, no carotid bruit, and thyroid not enlarged,  symmetric, no tenderness/mass/nodules Lungs: diminished breath sounds bibasilar Heart: regular rate and rhythm Abdomen: Protuberant, positive fluid wave Extremities: edema 2-3+ bilateral pitting Pulses: 2+ and symmetric Skin: Pale, warm, dry Neurologic: Grossly normal Psych: Frustrated  Inpatient Medications    Scheduled Meds:  apixaban  2.5 mg Oral BID   Chlorhexidine Gluconate Cloth  6 each Topical Daily   furosemide  80 mg Intravenous BID   insulin aspart  0-9 Units Subcutaneous TID WC   metoprolol succinate  25 mg Oral BID   sodium chloride flush  3 mL Intravenous Q12H    Continuous Infusions:  sodium chloride     albumin human      PRN Meds: sodium chloride, acetaminophen **OR** acetaminophen, sodium chloride flush   Labs   Results for orders placed or performed during the hospital encounter of 04/20/21 (from the past 48 hour(s))  Troponin I (High Sensitivity)     Status: Abnormal   Collection Time: 04/20/21 12:54 PM  Result Value Ref Range   Troponin I (High Sensitivity) 83 (H) <18 ng/L    Comment: (NOTE) Elevated high sensitivity troponin I (hsTnI) values and significant  changes across serial measurements may suggest ACS but many other  chronic and acute conditions are known to elevate hsTnI results.  Refer to the "Links" section for chest pain algorithms and additional  guidance.  Performed at [facility], [place] [address] [place], [place] [address]   Magnesium     Status: None   Collection Time: 04/20/21 12:54 PM  Result Value Ref Range   Magnesium 2.0 1.7 - 2.4 mg/dL    Comment: Performed at [facility], [place] [address], [place], [place] [address]  Basic metabolic panel     Status: Abnormal   Collection Time: 04/20/21  1:48 PM  Result Value Ref Range   Sodium 135 135 - 145 mmol/L   Potassium 3.2 (L) 3.5 - 5.1 mmol/L   Chloride 99 98 - 111 mmol/L   CO2 21 (L) 22 - 32 mmol/L   Glucose, Bld 168 (H) 70 - 99 mg/dL    Comment: Glucose  reference range applies only to samples taken after fasting for at least 8 hours.   BUN 75 (H) 8 - 23 mg/dL   Creatinine, Ser 2.08 (H) 0.61 - 1.24 mg/dL   Calcium 9.3 8.9 - 10.3 mg/dL   GFR, Estimated 31 (L) >60 mL/min    Comment: (NOTE) Calculated using the CKD-EPI Creatinine Equation (2021)    Anion gap 15 5 - 15    Comment: Performed at [facility] [address], [place], [place] [address]  CBC with Differential     Status: Abnormal   Collection Time: 04/20/21  1:48 PM  Result Value Ref Range   WBC 10.2 4.0 - 10.5 K/uL   RBC 4.32 4.22 - 5.81 MIL/uL   Hemoglobin 11.9 (L) 13.0 - 17.0 g/dL   HCT 37.0 (L) 39.0 - 52.0 %   MCV 85.6 80.0 - 100.0 fL   MCH 27.5 26.0 - 34.0 pg   MCHC 32.2 30.0 - 36.0 g/dL   RDW 14.6 11.5 - 15.5 %   Platelets 233 150 - 400 K/uL   nRBC 0.0 0.0 - 0.2 %   Neutrophils Relative % 71 %   Neutro Abs 7.3 1.7 - 7.7 K/uL   Lymphocytes Relative 15 %   Lymphs Abs 1.5 0.7 - 4.0 K/uL   Monocytes Relative 12 %   Monocytes Absolute 1.2 (H) 0.1 - 1.0 K/uL   Eosinophils Relative 1 %   Eosinophils Absolute 0.1 0.0 - 0.5 K/uL   Basophils Relative 0 %   Basophils Absolute 0.0 0.0 - 0.1 K/uL   Immature Granulocytes 1 %   Abs Immature Granulocytes 0.06 0.00 - 0.07 K/uL    Comment: Performed at [facility] [address], [place], [place] [address]  Brain natriuretic peptide     Status: Abnormal   Collection Time: 04/20/21  1:48 PM  Result Value Ref Range   B Natriuretic Peptide 1,460.7 (H) 0.0 - 100.0 pg/mL    Comment: Performed at [facility] [address] [place], [place] [address]  Troponin I (High Sensitivity)     Status: Abnormal   Collection Time: 04/20/21  1:48 PM  Result Value Ref Range   Troponin I (High Sensitivity) 93 (H) <18 ng/L    Comment: (NOTE) Elevated high sensitivity troponin I (hsTnI) values and significant  changes across serial measurements may suggest ACS but many other  chronic and acute conditions are known to  elevate hsTnI results.  Refer to the "Links" section for chest pain algorithms and additional  guidance. Performed at [facility], [place] [address], [place], [place] [address]   Resp Panel by RT-PCR (Flu A&B, Covid) Nasopharyngeal Swab     Status: None  Collection Time: 04/20/21  3:30 PM   Specimen: Nasopharyngeal Swab; Nasopharyngeal(NP) swabs in vial transport medium  Result Value Ref Range   SARS Coronavirus 2 by RT PCR NEGATIVE NEGATIVE    Comment: (NOTE) SARS-CoV-2 target nucleic acids are NOT DETECTED.  The SARS-CoV-2 RNA is generally detectable in upper respiratory specimens during the acute phase of infection. The lowest concentration of SARS-CoV-2 viral copies this assay can detect is 138 copies/mL. A negative result does not preclude SARS-Cov-2 infection and should not be used as the sole basis for treatment or other patient management decisions. A negative result may occur with  improper specimen collection/handling, submission of specimen other than nasopharyngeal swab, presence of viral mutation(s) within the areas targeted by this assay, and inadequate number of viral copies(<138 copies/mL). A negative result must be combined with clinical observations, patient history, and epidemiological information. The expected result is Negative.  Fact Sheet for Patients:  EntrepreneurPulse.com.au  Fact Sheet for Healthcare Providers:  IncredibleEmployment.be  This test is no t yet approved or cleared by the [place] FDA and  has been authorized for detection and/or diagnosis of SARS-CoV-2 by FDA under an Emergency Use Authorization (EUA). This EUA will remain  in effect (meaning this test can be used) for the duration of the COVID-19 declaration under Section 564(b)(1) of the Act, 21 U.S.C.section 360bbb-3(b)(1), unless the authorization is terminated  or revoked sooner.       Influenza A by PCR NEGATIVE NEGATIVE   Influenza  B by PCR NEGATIVE NEGATIVE    Comment: (NOTE) The Xpert Xpress SARS-CoV-2/FLU/RSV plus assay is intended as an aid in the diagnosis of influenza from Nasopharyngeal swab specimens and should not be used as a sole basis for treatment. Nasal washings and aspirates are unacceptable for Xpert Xpress SARS-CoV-2/FLU/RSV testing.  Fact Sheet for Patients: EntrepreneurPulse.com.au  Fact Sheet for Healthcare Providers: IncredibleEmployment.be  This test is not yet approved or cleared by the [place] FDA and has been authorized for detection and/or diagnosis of SARS-CoV-2 by FDA under an Emergency Use Authorization (EUA). This EUA will remain in effect (meaning this test can be used) for the duration of the COVID-19 declaration under Section 564(b)(1) of the Act, 21 U.S.C. section 360bbb-3(b)(1), unless the authorization is terminated or revoked.  Performed at [facility], [place] [address], [place], [place] [address]   Glucose, capillary     Status: Abnormal   Collection Time: 04/20/21  5:34 PM  Result Value Ref Range   Glucose-Capillary 170 (H) 70 - 99 mg/dL    Comment: Glucose reference range applies only to samples taken after fasting for at least 8 hours.  Hepatic function panel     Status: Abnormal   Collection Time: 04/20/21  6:10 PM  Result Value Ref Range   Total Protein 6.5 6.5 - 8.1 g/dL   Albumin 2.6 (L) 3.5 - 5.0 g/dL   AST 23 15 - 41 U/L   ALT 17 0 - 44 U/L   Alkaline Phosphatase 65 38 - 126 U/L   Total Bilirubin 1.4 (H) 0.3 - 1.2 mg/dL   Bilirubin, Direct 0.5 (H) 0.0 - 0.2 mg/dL   Indirect Bilirubin 0.9 0.3 - 0.9 mg/dL    Comment: Performed at [facility] [address] [place], [place] [address]  Cooxemetry Panel (carboxy, met, total hgb, O2 sat)     Status: Abnormal   Collection Time: 04/20/21  6:10 PM  Result Value Ref Range   Total hemoglobin 11.6 (L)  12.0 - 16.0 g/dL   O2 Saturation 66.0 %    Carboxyhemoglobin 1.4 0.5 - 1.5 %   Methemoglobin 0.9 0.0 - 1.5 %    Comment: Performed at [facility] [address] [place], [place] [address]  Urinalysis, Routine w reflex microscopic Urine, Clean Catch     Status: None   Collection Time: 04/20/21  8:41 PM  Result Value Ref Range   Color, Urine YELLOW YELLOW   APPearance CLEAR CLEAR   Specific Gravity, Urine 1.020 1.005 - 1.030   pH 5.5 5.0 - 8.0   Glucose, UA NEGATIVE NEGATIVE mg/dL   Hgb urine dipstick NEGATIVE NEGATIVE   Bilirubin Urine NEGATIVE NEGATIVE   Ketones, ur NEGATIVE NEGATIVE mg/dL   Protein, ur NEGATIVE NEGATIVE mg/dL   Nitrite NEGATIVE NEGATIVE   Leukocytes,Ua NEGATIVE NEGATIVE    Comment: Microscopic not done on urines with negative protein, blood, leukocytes, nitrite, or glucose < 500 mg/dL. Performed at [facility], [place] [address] [place], [place] [address]   Glucose, capillary     Status: Abnormal   Collection Time: 04/20/21  8:58 PM  Result Value Ref Range   Glucose-Capillary 190 (H) 70 - 99 mg/dL    Comment: Glucose reference range applies only to samples taken after fasting for at least 8 hours.   Comment 1 Notify RN    Comment 2 Document in Chart   Basic metabolic panel     Status: Abnormal   Collection Time: 04/21/21  3:35 AM  Result Value Ref Range   Sodium 136 135 - 145 mmol/L   Potassium 3.2 (L) 3.5 - 5.1 mmol/L   Chloride 102 98 - 111 mmol/L   CO2 22 22 - 32 mmol/L   Glucose, Bld 130 (H) 70 - 99 mg/dL    Comment: Glucose reference range applies only to samples taken after fasting for at least 8 hours.   BUN 76 (H) 8 - 23 mg/dL   Creatinine, Ser 2.03 (H) 0.61 - 1.24 mg/dL   Calcium 8.7 (L) 8.9 - 10.3 mg/dL   GFR, Estimated 32 (L) >60 mL/min    Comment: (NOTE) Calculated using the CKD-EPI Creatinine Equation (2021)    Anion gap 12 5 - 15    Comment: Performed at [facility] [address] [place], [place] [address]  CBC     Status: Abnormal   Collection Time:  04/21/21  3:35 AM  Result Value Ref Range   WBC 7.6 4.0 - 10.5 K/uL   RBC 3.91 (L) 4.22 - 5.81 MIL/uL   Hemoglobin 10.6 (L) 13.0 - 17.0 g/dL   HCT 33.3 (L) 39.0 - 52.0 %   MCV 85.2 80.0 - 100.0 fL   MCH 27.1 26.0 - 34.0 pg   MCHC 31.8 30.0 - 36.0 g/dL   RDW 14.6 11.5 - 15.5 %   Platelets 198 150 - 400 K/uL   nRBC 0.0 0.0 - 0.2 %    Comment: Performed at [facility], [place] [address] [place], [place] [address]  Glucose, capillary     Status: Abnormal   Collection Time: 04/21/21  5:50 AM  Result Value Ref Range   Glucose-Capillary 129 (H) 70 - 99 mg/dL    Comment: Glucose reference range applies only to samples taken after fasting for at least 8 hours.   Comment 1 Notify RN    Comment 2 Document in Chart     ECG   N/A  Telemetry   V-paced -  Personally Reviewed  Radiology    DG Chest 2 View  Result Date: 04/20/2021 CLINICAL DATA:  Shortness of breath. EXAM: CHEST - 2 VIEW COMPARISON:  Chest x-rays dated 04/02/2021 and 09/21/2016. FINDINGS: Study is hypoinspiratory with crowding of the perihilar and bibasilar bronchovascular markings. Given the slightly low lung volumes, lungs are clear. No confluent opacity to suggest a developing pneumonia. No pleural effusion or pneumothorax is seen. Stable cardiomegaly. LEFT chest wall pacemaker/ICD apparatus in place. Osseous structures about the chest are unremarkable. IMPRESSION: Low lung volumes. No active cardiopulmonary disease. No evidence of pneumonia or pulmonary edema. Cardiomegaly. Electronically Signed   By: Franki Cabot M.D.   On: 04/20/2021 14:00   DG CHEST PORT 1 VIEW  Result Date: 04/21/2021 CLINICAL DATA:  Placement of PICC line EXAM: PORTABLE CHEST 1 VIEW COMPARISON:  04/20/2021 FINDINGS: Transverse diameter of heart is increased. Central pulmonary vessels are prominent. There is increase in interstitial markings in the parahilar regions and lower lung fields, possibly suggesting mild interstitial edema. There is no focal  pulmonary consolidation. Costophrenic angles are clear. There is no pneumothorax. Pacemaker battery is seen in the left infraclavicular region. There is interval placement of PICC line through the right upper extremity with its tip in the region of superior vena cava. Postsurgical changes are noted in the right shoulder. IMPRESSION: Cardiomegaly. Central pulmonary vessels are more prominent suggesting CHF. No new focal infiltrates are seen. Tip of PICC line is seen in the course of superior vena cava. Electronically Signed   By: Elmer Picker M.D.   On: 04/21/2021 09:52   [place] ASCITES (ABDOMEN LIMITED)  Result Date: 04/20/2021 CLINICAL DATA:  Ascites, abdominal distention. EXAM: LIMITED ABDOMEN ULTRASOUND FOR ASCITES TECHNIQUE: Limited ultrasound survey for ascites was performed in all four abdominal quadrants. COMPARISON:  09/01/2020. FINDINGS: Moderate to large ascites is noted in all 4 quadrants, the largest pocket in the right upper quadrant measuring up to 15.8 cm. IMPRESSION: Moderate to large ascites. Electronically Signed   By: Brett Fairy M.D.   On: 04/20/2021 22:44   [place] EKG SITE RITE  Result Date: 04/21/2021 If Site Rite image not attached, placement could not be confirmed due to current cardiac rhythm.   Cardiac Studies   Echo pending  Assessment   Principal Problem:   Acute on chronic combined systolic and diastolic CHF (congestive heart failure) (HCC) Active Problems:   Diabetes mellitus with chronic kidney disease ([facility])   Essential hypertension   Persistent atrial fibrillation (HCC)   Cardiac device in situ   Hypokalemia   Acute renal failure superimposed on stage 3a chronic kidney disease (HCC)   Ascites   Acute right-sided heart failure (HCC)   Elevated troponin   Plan   Acute on chronic combined systolic and diastolic congestive heart failure -with predominant right heart failure symptoms Mr. Bendorf presents with 20 to 25 pound weight gain and progressive  dyspnea on exertion over the past several weeks and has notable lower extremity edema and increased abdominal girth with fluid wave concerning for ascites.  I am concerned he has developed right ventricular probable biventricular heart failure.  He has made little urine with his initial dose of IV Lasix.  Continue Lasix 80 IV twice daily  PICC line placed-stat co-ox, suspect he will need inotrope therapy Large volume ascites -proceed with diagnostic and therapeutic paracentesis per interventional radiology May need advanced heart failure evaluation. Permanent atrial fibrillation Noted, on anticoagulation, status post pacemaker which is functioning appropriately. Status post permanent pacemaker  V pacing 100% of the time -question pacemaker related cardiomyopathy Hypertension Appears well-controlled CKD 3A Creatinine around 2- GFR 31.  Creatinine stable but urine output not terrific.  Time Spent Directly with Patient:  I have spent a total of 35 minutes with the patient reviewing hospital notes, telemetry, EKGs, labs and examining the patient as well as establishing an assessment and plan that was discussed personally with the patient.  > 50% of time was spent in direct patient care.  Length of Stay:  LOS: 0 days   Pixie Casino, MD, [facility], [place] Director of the Advanced Lipid Disorders &  Cardiovascular Risk Reduction Clinic Diplomate of the American Board of Clinical Lipidology Attending Cardiologist  Direct Dial: [phone]   Fax: [phone]  Website:  [email or web address]  Nadean Corwin Ernesto Zukowski 04/21/2021, 10:18 AM

## 2021-04-22 ENCOUNTER — Inpatient Hospital Stay (HOSPITAL_COMMUNITY): Payer: PPO

## 2021-04-22 DIAGNOSIS — N170 Acute kidney failure with tubular necrosis: Secondary | ICD-10-CM | POA: Diagnosis not present

## 2021-04-22 DIAGNOSIS — I5031 Acute diastolic (congestive) heart failure: Secondary | ICD-10-CM | POA: Diagnosis not present

## 2021-04-22 DIAGNOSIS — I5043 Acute on chronic combined systolic (congestive) and diastolic (congestive) heart failure: Secondary | ICD-10-CM | POA: Diagnosis not present

## 2021-04-22 DIAGNOSIS — Z959 Presence of cardiac and vascular implant and graft, unspecified: Secondary | ICD-10-CM | POA: Diagnosis not present

## 2021-04-22 DIAGNOSIS — I50811 Acute right heart failure: Secondary | ICD-10-CM | POA: Diagnosis not present

## 2021-04-22 LAB — MAGNESIUM: Magnesium: 1.9 mg/dL (ref 1.7–2.4)

## 2021-04-22 LAB — ECHOCARDIOGRAM COMPLETE
AR max vel: 2.6 cm2
AV Area VTI: 2.64 cm2
AV Area mean vel: 2.34 cm2
AV Mean grad: 3 mmHg
AV Peak grad: 5.1 mmHg
Ao pk vel: 1.13 m/s
Height: 72 in
S' Lateral: 3.6 cm
Weight: 3684.8 oz

## 2021-04-22 LAB — BASIC METABOLIC PANEL
Anion gap: 11 (ref 5–15)
BUN: 69 mg/dL — ABNORMAL HIGH (ref 8–23)
CO2: 24 mmol/L (ref 22–32)
Calcium: 8.6 mg/dL — ABNORMAL LOW (ref 8.9–10.3)
Chloride: 101 mmol/L (ref 98–111)
Creatinine, Ser: 1.94 mg/dL — ABNORMAL HIGH (ref 0.61–1.24)
GFR, Estimated: 33 mL/min — ABNORMAL LOW (ref >60)
Glucose, Bld: 143 mg/dL — ABNORMAL HIGH (ref 70–99)
Potassium: 2.9 mmol/L — ABNORMAL LOW (ref 3.5–5.1)
Sodium: 136 mmol/L (ref 135–145)

## 2021-04-22 LAB — CBC
HCT: 31.8 % — ABNORMAL LOW (ref 39.0–52.0)
Hemoglobin: 10.7 g/dL — ABNORMAL LOW (ref 13.0–17.0)
MCH: 28.2 pg (ref 26.0–34.0)
MCHC: 33.6 g/dL (ref 30.0–36.0)
MCV: 83.7 fL (ref 80.0–100.0)
Platelets: 177 10*3/uL (ref 150–400)
RBC: 3.8 MIL/uL — ABNORMAL LOW (ref 4.22–5.81)
RDW: 14.6 % (ref 11.5–15.5)
WBC: 8.7 10*3/uL (ref 4.0–10.5)
nRBC: 0 % (ref 0.0–0.2)

## 2021-04-22 LAB — GLUCOSE, CAPILLARY
Glucose-Capillary: 141 mg/dL — ABNORMAL HIGH (ref 70–99)
Glucose-Capillary: 124 mg/dL — ABNORMAL HIGH (ref 70–99)
Glucose-Capillary: 177 mg/dL — ABNORMAL HIGH (ref 70–99)
Glucose-Capillary: 160 mg/dL — ABNORMAL HIGH (ref 70–99)
Glucose-Capillary: 221 mg/dL — ABNORMAL HIGH (ref 70–99)

## 2021-04-22 LAB — COOXEMETRY PANEL
Carboxyhemoglobin: 1.9 % — ABNORMAL HIGH (ref 0.5–1.5)
Methemoglobin: 0.8 % (ref 0.0–1.5)
O2 Saturation: 73.5 %
Total hemoglobin: 10.5 g/dL — ABNORMAL LOW (ref 12.0–16.0)

## 2021-04-22 MED ORDER — PERFLUTREN LIPID MICROSPHERE
1.0000 mL | INTRAVENOUS | Status: AC | PRN
  Administered 2021-04-22: 3 mL via INTRAVENOUS
  Filled 2021-04-22: qty 10

## 2021-04-22 MED ORDER — INSULIN ASPART 100 UNIT/ML IJ SOLN: 0.0000 [IU] | Freq: Three times a day (TID) | INTRAMUSCULAR | Status: DC

## 2021-04-22 MED ORDER — METOLAZONE 2.5 MG PO TABS
2.5000 mg | ORAL_TABLET | Freq: Once | ORAL | Status: AC
  Administered 2021-04-22: 2.5 mg via ORAL
  Filled 2021-04-22: qty 1

## 2021-04-22 MED ORDER — POTASSIUM CHLORIDE CRYS ER 20 MEQ PO TBCR
40.0000 meq | EXTENDED_RELEASE_TABLET | ORAL | Status: AC
  Administered 2021-04-22 (×2): 40 meq via ORAL
  Filled 2021-04-22 (×2): qty 2

## 2021-04-22 MED ORDER — ALBUMIN HUMAN 5 % IV SOLN
25.0000 g | Freq: Once | INTRAVENOUS | Status: AC
  Administered 2021-04-22: 25 g via INTRAVENOUS
  Filled 2021-04-22: qty 500

## 2021-04-22 MED ORDER — MAGNESIUM SULFATE 2 GM/50ML IV SOLN
2.0000 g | Freq: Once | INTRAVENOUS | Status: AC
  Administered 2021-04-22: 2 g via INTRAVENOUS
  Filled 2021-04-22: qty 50

## 2021-04-22 MED ORDER — POTASSIUM CHLORIDE CRYS ER 20 MEQ PO TBCR
40.0000 meq | EXTENDED_RELEASE_TABLET | Freq: Every day | ORAL | Status: AC
Start: 2021-04-22 — End: 2021-04-23
  Administered 2021-04-22 – 2021-04-23 (×2): 40 meq via ORAL
  Filled 2021-04-22 (×2): qty 2

## 2021-04-22 MED ORDER — MILRINONE LACTATE IN DEXTROSE 20-5 MG/100ML-% IV SOLN
0.2500 ug/kg/min | INTRAVENOUS | Status: DC
  Administered 2021-04-22 – 2021-04-30 (×16): 0.25 ug/kg/min via INTRAVENOUS
  Filled 2021-04-22 (×17): qty 100

## 2021-04-22 MED ORDER — INSULIN ASPART 100 UNIT/ML IJ SOLN
0.0000 [IU] | Freq: Three times a day (TID) | INTRAMUSCULAR | Status: DC
  Administered 2021-04-22: 3 [IU] via SUBCUTANEOUS
  Administered 2021-04-23 (×2): 2 [IU] via SUBCUTANEOUS
  Administered 2021-04-23: 3 [IU] via SUBCUTANEOUS
  Administered 2021-04-23 – 2021-04-24 (×3): 2 [IU] via SUBCUTANEOUS
  Administered 2021-04-24: 3 [IU] via SUBCUTANEOUS
  Administered 2021-04-24 – 2021-04-25 (×2): 2 [IU] via SUBCUTANEOUS

## 2021-04-22 NOTE — Progress Notes (Addendum)
PROGRESS NOTE    Vincent Allen  ERD:[id number] DOB: 1933-10-21 DOA: 04/20/2021 PCP: [name], MD  Brief Narrative:Vincent Allen is a 88 y.o. male with medical history significant of CKD stage 3, T2DM, GERD, HTN, HLD, persistent Afib on eliquis, symptomatic bradycardia s/p PPM presenting with 3 week history of worsening shortness of breath with exertion, 20-25 pound weight gain In the ED his creatinine 2.0, BNP 1460, troponin 93, chest x-ray was unremarkable  Subjective: -Feels better today, breathing is improving, had 5 L drained by paracentesis yesterday  Assessment and Plan: * Acute on chronic combined systolic and diastolic CHF (congestive heart failure) ([place])- (present on admission) Moderate MR, right heart failure -last echo with EF 40-45%, moderate MR  -Finally improving, he is 1.6 L negative, continue Lasix 80 mg twice daily -Underwent paracentesis yesterday, 5 L drained -Appreciate cardiology input, plan to start milrinone today -Remains on low-dose metoprolol  Elevated troponin- (present on admission) Mildly elevated with flat trend, do not suspect ACS -Likely demand ischemia in the setting of CHF  Ascites- (present on admission) - See discussion above, s/p paracentesis, -Suspect this is all related to right heart failure, he does not have history of other known liver disease  Acute renal failure superimposed on stage 3a chronic kidney disease ([place])- (present on admission) Cardiorenal syndrome  -baseline creatinine appears to be around 1.6-1.8 -Now 2.0 from 2.3 yesterday, monitor with diuresis,  see discussion above  Persistent atrial fibrillation ([place])- (present on admission) - Stable, continue Toprol and Eliquis  Diabetes mellitus with chronic kidney disease ([place])- (present on admission) a1c of 5.8 in 03/2021 -CBG stable, continue sliding scale insulin -Glucotrol and Januvia on hold  Hypokalemia- (present on admission) Replaced  Cardiac device in  situ- (present on admission) V-pacing, follow cards recs   Essential hypertension- (present on admission) - BP stable, continue Toprol, amlodipine on hold -IV Lasix as above  DVT prophylaxis:eliquis Code Status: DNR Family Communication: Discussed with patient and daughter at bedside Disposition Plan: Home pending improvement in volume status  Consultants:  cards  Procedures: Paracentesis 2/4, 5 L drained  Antimicrobials:    Objective: Vitals:   04/22/21 1200 04/22/21 2013 04/23/21 0424 04/23/21 0442  BP: (!) 120/56 (!) 111/48  (!) 109/47  Pulse: 61 60 (!) 54 (!) 59  Resp: 17 18 18 17   Temp:  97.6 F (36.4 C)  97.6 F (36.4 C)  TempSrc:  Oral  Oral  SpO2:  98%  98%  Weight:   106.6 kg   Height:        Intake/Output Summary (Last 24 hours) at 04/23/2021 0844 Last data filed at 04/23/2021 0829 Gross per 24 hour  Intake 1386.49 ml  Output 1450 ml  Net -63.51 ml   Filed Weights   04/21/21 0225 04/22/21 0354 04/23/21 0424  Weight: 111.1 kg 104.5 kg 106.6 kg    Examination:  General exam: Appears calm and comfortable  Respiratory system: Clear to auscultation Cardiovascular system: S1 & S2 heard, RRR.  Abd: nondistended, soft and nontender.Normal bowel sounds heard. Central nervous system: Alert and oriented. No focal neurological deficits. Extremities: no edema Skin: No rashes Psychiatry:  Mood & affect appropriate.     Data Reviewed:   CBC: Recent Labs  Lab 04/20/21 1348 04/21/21 0335 04/22/21 0532  WBC 10.2 7.6 8.7  NEUTROABS 7.3  --   --   HGB 11.9* 10.6* 10.7*  HCT 37.0* 33.3* 31.8*  MCV 85.6 85.2 83.7  PLT 233 198 177  Basic Metabolic Panel: Recent Labs  Lab 04/20/21 1254 04/20/21 1348 04/21/21 0335 04/22/21 0532 04/23/21 0429  NA  --  135 136 136 130*  K  --  3.2* 3.2* 2.9* 3.2*  CL  --  99 102 101 98  CO2  --  21* 22 24 23   GLUCOSE  --  168* 130* 143* 182*  BUN  --  75* 76* 69* 72*  CREATININE  --  2.08* 2.03* 1.94* 2.18*   CALCIUM  --  9.3 8.7* 8.6* 8.2*  MG 2.0  --   --  1.9 1.9   GFR: Estimated Creatinine Clearance: 31.3 mL/min (A) (by C-G formula based on SCr of 2.18 mg/dL (H)). Liver Function Tests: Recent Labs  Lab 04/20/21 1810  AST 23  ALT 17  ALKPHOS 65  BILITOT 1.4*  PROT 6.5  ALBUMIN 2.6*   No results for input(s): LIPASE, AMYLASE in the last 168 hours. No results for input(s): AMMONIA in the last 168 hours. Coagulation Profile: No results for input(s): INR, PROTIME in the last 168 hours. Cardiac Enzymes: No results for input(s): CKTOTAL, CKMB, CKMBINDEX, TROPONINI in the last 168 hours. BNP (last 3 results) No results for input(s): PROBNP in the last 8760 hours. HbA1C: No results for input(s): HGBA1C in the last 72 hours. CBG: Recent Labs  Lab 04/22/21 0751 04/22/21 1122 04/22/21 1629 04/22/21 2055 04/23/21 0600  GLUCAP 124* 177* 160* 221* 180*   Lipid Profile: No results for input(s): CHOL, HDL, LDLCALC, TRIG, CHOLHDL, LDLDIRECT in the last 72 hours. Thyroid Function Tests: No results for input(s): TSH, T4TOTAL, FREET4, T3FREE, THYROIDAB in the last 72 hours. Anemia Panel: No results for input(s): VITAMINB12, FOLATE, FERRITIN, TIBC, IRON, RETICCTPCT in the last 72 hours. Urine analysis:    Component Value Date/Time   COLORURINE YELLOW 04/20/2021 2041   APPEARANCEUR CLEAR 04/20/2021 2041   LABSPEC 1.020 04/20/2021 2041   PHURINE 5.5 04/20/2021 2041   GLUCOSEU NEGATIVE 04/20/2021 2041   GLUCOSEU NEGATIVE 08/10/2020 1513   HGBUR NEGATIVE 04/20/2021 2041   BILIRUBINUR NEGATIVE 04/20/2021 2041   BILIRUBINUR Negative 04/02/2021 1605   KETONESUR NEGATIVE 04/20/2021 2041   PROTEINUR NEGATIVE 04/20/2021 2041   UROBILINOGEN 0.2 04/02/2021 1605   UROBILINOGEN 1.0 08/10/2020 1513   NITRITE NEGATIVE 04/20/2021 2041   LEUKOCYTESUR NEGATIVE 04/20/2021 2041   Sepsis Labs: @LABRCNTIP (procalcitonin:4,lacticidven:4)  ) Recent Results (from the past 240 hour(s))  Resp Panel  by RT-PCR (Flu A&B, Covid) Nasopharyngeal Swab     Status: None   Collection Time: 04/20/21  3:30 PM   Specimen: Nasopharyngeal Swab; Nasopharyngeal(NP) swabs in vial transport medium  Result Value Ref Range Status   SARS Coronavirus 2 by RT PCR NEGATIVE NEGATIVE Final    Comment: (NOTE) SARS-CoV-2 target nucleic acids are NOT DETECTED.  The SARS-CoV-2 RNA is generally detectable in upper respiratory specimens during the acute phase of infection. The lowest concentration of SARS-CoV-2 viral copies this assay can detect is 138 copies/mL. A negative result does not preclude SARS-Cov-2 infection and should not be used as the sole basis for treatment or other patient management decisions. A negative result may occur with  improper specimen collection/handling, submission of specimen other than nasopharyngeal swab, presence of viral mutation(s) within the areas targeted by this assay, and inadequate number of viral copies(<138 copies/mL). A negative result must be combined with clinical observations, patient history, and epidemiological information. The expected result is Negative.  Fact Sheet for Patients:  EntrepreneurPulse.com.au  Fact Sheet for Healthcare Providers:  IncredibleEmployment.be  This test is no t yet approved or cleared by the [place] and  has been authorized for detection and/or diagnosis of SARS-CoV-2 by FDA under an Emergency Use Authorization (EUA). This EUA will remain  in effect (meaning this test can be used) for the duration of the COVID-19 declaration under Section 564(b)(1) of the Act, 21 U.S.C.section 360bbb-3(b)(1), unless the authorization is terminated  or revoked sooner.       Influenza A by PCR NEGATIVE NEGATIVE Final   Influenza B by PCR NEGATIVE NEGATIVE Final    Comment: (NOTE) The Xpert Xpress SARS-CoV-2/FLU/RSV plus assay is intended as an aid in the diagnosis of influenza from Nasopharyngeal swab  specimens and should not be used as a sole basis for treatment. Nasal washings and aspirates are unacceptable for Xpert Xpress SARS-CoV-2/FLU/RSV testing.  Fact Sheet for Patients: EntrepreneurPulse.com.au  Fact Sheet for Healthcare Providers: IncredibleEmployment.be  This test is not yet approved or cleared by the [place] FDA and has been authorized for detection and/or diagnosis of SARS-CoV-2 by FDA under an Emergency Use Authorization (EUA). This EUA will remain in effect (meaning this test can be used) for the duration of the COVID-19 declaration under Section 564(b)(1) of the Act, 21 U.S.C. section 360bbb-3(b)(1), unless the authorization is terminated or revoked.  Performed at [facility], [place] [address], [place], [place] [address]   Gram stain     Status: None   Collection Time: 04/21/21 11:39 AM   Specimen: Peritoneal Washings  Result Value Ref Range Status   Specimen Description PERITONEAL FLUID  Final   Special Requests NONE  Final   Gram Stain   Final    WBC PRESENT,BOTH PMN AND MONONUCLEAR NO ORGANISMS SEEN CYTOSPIN SMEAR Performed at [facility], [address], [place], [place] [address]    Report Status 04/21/2021 FINAL  Final  Culture, body fluid w Gram Stain-bottle     Status: None (Preliminary result)   Collection Time: 04/21/21 11:39 AM   Specimen: Peritoneal Washings  Result Value Ref Range Status   Specimen Description PERITONEAL FLUID  Final   Special Requests NONE  Final   Culture   Final    NO GROWTH < 24 HOURS Performed at [facility], [place] [address], [place], [place] [address]    Report Status PENDING  Incomplete     Radiology Studies: US Paracentesis  Result Date: 04/21/2021 INDICATION: Patient with history of heart failure, chronic kidney disease, ascites. Request received for diagnostic and therapeutic paracentesis up to 5 liters. EXAM: ULTRASOUND GUIDED DIAGNOSTIC AND  THERAPEUTIC PARACENTESIS MEDICATIONS: 10 mL 1% lidocaine COMPLICATIONS: None immediate. PROCEDURE: Informed written consent was obtained from the patient after a discussion of the risks, benefits and alternatives to treatment. A timeout was performed prior to the initiation of the procedure. Initial ultrasound scanning demonstrates a large amount of ascites within the right lower abdominal quadrant. The right lower abdomen was prepped and draped in the usual sterile fashion. 1% lidocaine was used for local anesthesia. Following this, a 19 gauge, 10-cm, Yueh catheter was introduced. An ultrasound image was saved for documentation purposes. The paracentesis was performed. The catheter was removed and a dressing was applied. The patient tolerated the procedure well without immediate post procedural complication. Patient received post-procedure intravenous albumin; see nursing notes for details. FINDINGS: A total of approximately 5 liters of yellow fluid was removed. Samples were sent to the laboratory as requested by the clinical team. IMPRESSION: Successful ultrasound-guided diagnostic  and therapeutic paracentesis yielding 5 liters of peritoneal fluid. Read by: Rowe Canuto, PA-C Electronically Signed   By: Sandi Mariscal M.D.   On: 04/21/2021 12:35   DG CHEST PORT 1 VIEW  Result Date: 04/21/2021 CLINICAL DATA:  Placement of PICC line EXAM: PORTABLE CHEST 1 VIEW COMPARISON:  04/20/2021 FINDINGS: Transverse diameter of heart is increased. Central pulmonary vessels are prominent. There is increase in interstitial markings in the parahilar regions and lower lung fields, possibly suggesting mild interstitial edema. There is no focal pulmonary consolidation. Costophrenic angles are clear. There is no pneumothorax. Pacemaker battery is seen in the left infraclavicular region. There is interval placement of PICC line through the right upper extremity with its tip in the region of superior vena cava. Postsurgical changes are  noted in the right shoulder. IMPRESSION: Cardiomegaly. Central pulmonary vessels are more prominent suggesting CHF. No new focal infiltrates are seen. Tip of PICC line is seen in the course of superior vena cava. Electronically Signed   By: Elmer Picker M.D.   On: 04/21/2021 09:52   ECHOCARDIOGRAM COMPLETE  Result Date: 04/22/2021    ECHOCARDIOGRAM REPORT   Patient Name:   Vincent Allen Date of Exam: 04/22/2021 Medical Rec #:  [id number]         Height:       72.0 in Accession #:    [id number]        Weight:       230.3 lb Date of Birth:  1933/09/10         BSA:          2.262 m Patient Age:    8 years          BP:           120/56 mmHg Patient Gender: M                 HR:           61 bpm. Exam Location:  Inpatient Procedure: 2D Echo, Cardiac Doppler, Color Doppler and Intracardiac            Opacification Agent Indications:    CHF-Acute Diastolic  History:        Patient has prior history of Echocardiogram examinations, most                 recent 08/18/2020. Pacemaker, Arrythmias:Atrial Fibrillation and                 Bradycardia; Risk Factors:Hypertension, Diabetes and                 Dyslipidemia. CKD. DOE.  Sonographer:    Clayton Lefort RDCS (AE) Referring Phys: [id number] [facility]  Sonographer Comments: Suboptimal apical window. IMPRESSIONS  1. Windows are limited. Challenging to fully assess wall motion. LVEF approximately 40-45%. Left ventricular ejection fraction, by estimation, is 40 to 45%. The left ventricle has mildly decreased function. Left ventricular diastolic parameters were normal.  2. Right ventricular systolic function is mildly reduced. The right ventricular size is normal. There is moderately elevated pulmonary artery systolic pressure.  3. The mitral valve is grossly normal. No evidence of mitral valve regurgitation.  4. Tricuspid valve regurgitation is mild to moderate.  5. Aortic valve regurgitation is not visualized. Aortic valve sclerosis/calcification is present, without any  evidence of aortic stenosis.  6. The inferior vena cava IVC not well visualized. Comparison(s): No significant change from prior study. FINDINGS  Left Ventricle: Windows are limited. Challenging to fully  assess wall motion. LVEF approximately 40-45%. Left ventricular ejection fraction, by estimation, is 40 to 45%. The left ventricle has mildly decreased function. Definity contrast agent was given  IV to delineate the left ventricular endocardial borders. The left ventricular internal cavity size was normal in size. There is no left ventricular hypertrophy. Left ventricular diastolic parameters were normal. Right Ventricle: The right ventricular size is normal. No increase in right ventricular wall thickness. Right ventricular systolic function is mildly reduced. There is moderately elevated pulmonary artery systolic pressure. The tricuspid regurgitant velocity is 3.09 m/s, and with an assumed right atrial pressure of 15 mmHg, the estimated right ventricular systolic pressure is 94.7 mmHg. Left Atrium: Left atrial size was normal in size. Right Atrium: Right atrial size was normal in size. Pericardium: There is no evidence of pericardial effusion. Mitral Valve: The mitral valve is grossly normal. No evidence of mitral valve regurgitation. Tricuspid Valve: The tricuspid valve is grossly normal. Tricuspid valve regurgitation is mild to moderate. Aortic Valve: Aortic valve regurgitation is not visualized. Aortic valve sclerosis/calcification is present, without any evidence of aortic stenosis. Aortic valve mean gradient measures 3.0 mmHg. Aortic valve peak gradient measures 5.1 mmHg. Aortic valve  area, by VTI measures 2.64 cm. Pulmonic Valve: The pulmonic valve was not well visualized. Pulmonic valve regurgitation is not visualized. Aorta: The aortic root and ascending aorta are structurally normal, with no evidence of dilitation. Venous: The inferior vena cava IVC not well visualized. IAS/Shunts: The interatrial  septum was not well visualized. Additional Comments: A device lead is visualized.  LEFT VENTRICLE PLAX 2D LVIDd:         4.90 cm LVIDs:         3.60 cm LV PW:         1.10 cm LV IVS:        1.30 cm LVOT diam:     2.10 cm LV SV:         54 LV SV Index:   24 LVOT Area:     3.46 cm  RIGHT VENTRICLE            IVC RV Basal diam:  3.70 cm    IVC diam: 2.70 cm RV Mid diam:    3.70 cm RV S prime:     9.46 cm/s TAPSE (M-mode): 1.4 cm LEFT ATRIUM           Index        RIGHT ATRIUM           Index LA diam:      4.20 cm 1.86 cm/m   RA Area:     20.80 cm LA Vol (A2C): 47.6 ml 21.04 ml/m  RA Volume:   59.90 ml  26.48 ml/m LA Vol (A4C): 67.7 ml 29.93 ml/m  AORTIC VALVE AV Area (Vmax):    2.60 cm AV Area (Vmean):   2.34 cm AV Area (VTI):     2.64 cm AV Vmax:           113.00 cm/s AV Vmean:          79.100 cm/s AV VTI:            0.203 m AV Peak Grad:      5.1 mmHg AV Mean Grad:      3.0 mmHg LVOT Vmax:         84.90 cm/s LVOT Vmean:        53.500 cm/s LVOT VTI:  0.155 m LVOT/AV VTI ratio: 0.76  AORTA Ao Root diam: 3.80 cm Ao Asc diam:  3.40 cm TRICUSPID VALVE TR Peak grad:   38.2 mmHg TR Vmax:        309.00 cm/s  SHUNTS Systemic VTI:  0.16 m Systemic Diam: 2.10 cm Vincent Allen Electronically signed by Vincent Allen Signature Date/Time: 04/22/2021/4:23:20 PM    Final      Scheduled Meds:  apixaban  2.5 mg Oral BID   Chlorhexidine Gluconate Cloth  6 each Topical Daily   furosemide  80 mg Intravenous BID   insulin aspart  0-9 Units Subcutaneous TID AC & HS   metoprolol succinate  25 mg Oral BID   potassium chloride  40 mEq Oral Daily   sodium chloride flush  10-40 mL Intracatheter Q12H   Continuous Infusions:  milrinone 0.25 mcg/kg/min (04/23/21 0600)     LOS: 2 days    Time spent: 59min  Domenic Polite, MD [facility]   04/23/2021, 8:44 AM

## 2021-04-22 NOTE — Progress Notes (Signed)
DAILY PROGRESS NOTE   Patient Name: Vincent Allen Date of Encounter: 04/22/2021 Cardiologist: Pixie Casino, MD  Chief Complaint   Feels better today  Patient Profile   88 yo male patient of mine with a history of chronic systolic heart failure (LVEF 40 to 45%), stage IIIa chronic kidney disease, type 2 diabetes, hypertension, dyslipidemia, persistent atrial fibrillation and symptomatic bradycardia status post pacemaker, presents with progressive shortness of breath and lower extremity edema concerning for heart failure.  Subjective   Diuresed about 844 cc overnight-  5L removed by paracentesis - looks like right heart failure, no SBP. Echo shows fatty livera, but not clearly cirrhosis. Suspect right heart failure. COOX has been good -echo pending. D/w Dr. Aundra Dubin with CHF, he recommends milrinone to aid with diuresis.  Objective   Vitals:   04/21/21 1511 04/21/21 1942 04/22/21 0348 04/22/21 0354  BP: (!) 116/56 (!) 109/54 121/65   Pulse:  60    Resp: _0 Temp:  (!) 97.5 F (36.4 C) 97.6 F (36.4 C)   TempSrc:  Oral Oral   SpO2:  99% 98%   Weight:    104.5 kg  Height:        Intake/Output Summary (Last 24 hours) at 04/22/2021 8887 Last data filed at 04/22/2021 5797 Gross per 24 hour  Intake 1535.28 ml  Output 2500 ml  Net -964.72 ml   Filed Weights   04/20/21 1720 04/21/21 0225 04/22/21 0354  Weight: 111.7 kg 111.1 kg 104.5 kg    Physical Exam   General appearance: alert, no distress, and morbidly obese Neck: JVD - several cm above sternal notch, no carotid bruit, and thyroid not enlarged, symmetric, no tenderness/mass/nodules Lungs: diminished breath sounds bibasilar Heart: regular rate and rhythm Abdomen: Protuberant, positive fluid wave Extremities: edema 2-3+ bilateral pitting Pulses: 2+ and symmetric Skin: Pale, warm, dry Neurologic: Grossly normal Psych: Frustrated  Inpatient Medications    Scheduled Meds:  apixaban  2.5 mg Oral BID    Chlorhexidine Gluconate Cloth  6 each Topical Daily   furosemide  80 mg Intravenous BID   insulin aspart  0-9 Units Subcutaneous TID WC   metoprolol succinate  25 mg Oral BID   sodium chloride flush  10-40 mL Intracatheter Q12H    Continuous Infusions:    PRN Meds: acetaminophen **OR** acetaminophen   Labs   Results for orders placed or performed during the hospital encounter of 04/20/21 (from the past 48 hour(s))  Troponin I (High Sensitivity)     Status: Abnormal   Collection Time: 04/20/21 12:54 PM  Result Value Ref Range   Troponin I (High Sensitivity) 83 (H) <18 ng/L    Comment: (NOTE) Elevated high sensitivity troponin I (hsTnI) values and significant  changes across serial measurements may suggest ACS but many other  chronic and acute conditions are known to elevate hsTnI results.  Refer to the "Links" section for chest pain algorithms and additional  guidance. Performed at [facility], [place] [address] [place], [place] [address]   Magnesium     Status: None   Collection Time: 04/20/21 12:54 PM  Result Value Ref Range   Magnesium 2.0 1.7 - 2.4 mg/dL    Comment: Performed at [facility], [place] [address], [place], [place] [address]  Basic metabolic panel     Status: Abnormal   Collection Time: 04/20/21  1:48 PM  Result Value Ref Range   Sodium 135 135 - 145 mmol/L  Potassium 3.2 (L) 3.5 - 5.1 mmol/L   Chloride 99 98 - 111 mmol/L   CO2 21 (L) 22 - 32 mmol/L   Glucose, Bld 168 (H) 70 - 99 mg/dL    Comment: Glucose reference range applies only to samples taken after fasting for at least 8 hours.   BUN 75 (H) 8 - 23 mg/dL   Creatinine, Ser 2.08 (H) 0.61 - 1.24 mg/dL   Calcium 9.3 8.9 - 10.3 mg/dL   GFR, Estimated 31 (L) >60 mL/min    Comment: (NOTE) Calculated using the CKD-EPI Creatinine Equation (2021)    Anion gap 15 5 - 15    Comment: Performed at [facility] [address], [place], [place] [address]  CBC with Differential     Status:  Abnormal   Collection Time: 04/20/21  1:48 PM  Result Value Ref Range   WBC 10.2 4.0 - 10.5 K/uL   RBC 4.32 4.22 - 5.81 MIL/uL   Hemoglobin 11.9 (L) 13.0 - 17.0 g/dL   HCT 37.0 (L) 39.0 - 52.0 %   MCV 85.6 80.0 - 100.0 fL   MCH 27.5 26.0 - 34.0 pg   MCHC 32.2 30.0 - 36.0 g/dL   RDW 14.6 11.5 - 15.5 %   Platelets 233 150 - 400 K/uL   nRBC 0.0 0.0 - 0.2 %   Neutrophils Relative % 71 %   Neutro Abs 7.3 1.7 - 7.7 K/uL   Lymphocytes Relative 15 %   Lymphs Abs 1.5 0.7 - 4.0 K/uL   Monocytes Relative 12 %   Monocytes Absolute 1.2 (H) 0.1 - 1.0 K/uL   Eosinophils Relative 1 %   Eosinophils Absolute 0.1 0.0 - 0.5 K/uL   Basophils Relative 0 %   Basophils Absolute 0.0 0.0 - 0.1 K/uL   Immature Granulocytes 1 %   Abs Immature Granulocytes 0.06 0.00 - 0.07 K/uL    Comment: Performed at [facility] [address], [place], [place] [address]  Brain natriuretic peptide     Status: Abnormal   Collection Time: 04/20/21  1:48 PM  Result Value Ref Range   B Natriuretic Peptide 1,460.7 (H) 0.0 - 100.0 pg/mL    Comment: Performed at [facility] [address] [place], [place] [address]  Troponin I (High Sensitivity)     Status: Abnormal   Collection Time: 04/20/21  1:48 PM  Result Value Ref Range   Troponin I (High Sensitivity) 93 (H) <18 ng/L    Comment: (NOTE) Elevated high sensitivity troponin I (hsTnI) values and significant  changes across serial measurements may suggest ACS but many other  chronic and acute conditions are known to elevate hsTnI results.  Refer to the "Links" section for chest pain algorithms and additional  guidance. Performed at [facility], [place] [address], [place], [place] [address]   Resp Panel by RT-PCR (Flu A&B, Covid) Nasopharyngeal Swab     Status: None   Collection Time: 04/20/21  3:30 PM   Specimen: Nasopharyngeal Swab; Nasopharyngeal(NP) swabs in vial transport medium  Result Value Ref Range   SARS Coronavirus 2 by RT PCR NEGATIVE  NEGATIVE    Comment: (NOTE) SARS-CoV-2 target nucleic acids are NOT DETECTED.  The SARS-CoV-2 RNA is generally detectable in upper respiratory specimens during the acute phase of infection. The lowest concentration of SARS-CoV-2 viral copies this assay can detect is 138 copies/mL. A negative result does not preclude SARS-Cov-2 infection and should not be used as the  sole basis for treatment or other patient management decisions. A negative result may occur with  improper specimen collection/handling, submission of specimen other than nasopharyngeal swab, presence of viral mutation(s) within the areas targeted by this assay, and inadequate number of viral copies(<138 copies/mL). A negative result must be combined with clinical observations, patient history, and epidemiological information. The expected result is Negative.  Fact Sheet for Patients:  EntrepreneurPulse.com.au  Fact Sheet for Healthcare Providers:  IncredibleEmployment.be  This test is no t yet approved or cleared by the [place] FDA and  has been authorized for detection and/or diagnosis of SARS-CoV-2 by FDA under an Emergency Use Authorization (EUA). This EUA will remain  in effect (meaning this test can be used) for the duration of the COVID-19 declaration under Section 564(b)(1) of the Act, 21 U.S.C.section 360bbb-3(b)(1), unless the authorization is terminated  or revoked sooner.       Influenza A by PCR NEGATIVE NEGATIVE   Influenza B by PCR NEGATIVE NEGATIVE    Comment: (NOTE) The Xpert Xpress SARS-CoV-2/FLU/RSV plus assay is intended as an aid in the diagnosis of influenza from Nasopharyngeal swab specimens and should not be used as a sole basis for treatment. Nasal washings and aspirates are unacceptable for Xpert Xpress SARS-CoV-2/FLU/RSV testing.  Fact Sheet for Patients: EntrepreneurPulse.com.au  Fact Sheet for Healthcare  Providers: IncredibleEmployment.be  This test is not yet approved or cleared by the [place] FDA and has been authorized for detection and/or diagnosis of SARS-CoV-2 by FDA under an Emergency Use Authorization (EUA). This EUA will remain in effect (meaning this test can be used) for the duration of the COVID-19 declaration under Section 564(b)(1) of the Act, 21 U.S.C. section 360bbb-3(b)(1), unless the authorization is terminated or revoked.  Performed at [facility], [place] [address] [place], [place] [address]   Glucose, capillary     Status: Abnormal   Collection Time: 04/20/21  5:34 PM  Result Value Ref Range   Glucose-Capillary 170 (H) 70 - 99 mg/dL    Comment: Glucose reference range applies only to samples taken after fasting for at least 8 hours.  Hepatic function panel     Status: Abnormal   Collection Time: 04/20/21  6:10 PM  Result Value Ref Range   Total Protein 6.5 6.5 - 8.1 g/dL   Albumin 2.6 (L) 3.5 - 5.0 g/dL   AST 23 15 - 41 U/L   ALT 17 0 - 44 U/L   Alkaline Phosphatase 65 38 - 126 U/L   Total Bilirubin 1.4 (H) 0.3 - 1.2 mg/dL   Bilirubin, Direct 0.5 (H) 0.0 - 0.2 mg/dL   Indirect Bilirubin 0.9 0.3 - 0.9 mg/dL    Comment: Performed at [facility] [address] [place], [place] [address]  Cooxemetry Panel (carboxy, met, total hgb, O2 sat)     Status: Abnormal   Collection Time: 04/20/21  6:10 PM  Result Value Ref Range   Total hemoglobin 11.6 (L) 12.0 - 16.0 g/dL   O2 Saturation 66.0 %   Carboxyhemoglobin 1.4 0.5 - 1.5 %   Methemoglobin 0.9 0.0 - 1.5 %    Comment: Performed at [facility] [address], [place],  [address]  Urinalysis, Routine w reflex microscopic Urine, Clean Catch     Status: None   Collection Time: 04/20/21  8:41 PM  Result Value Ref Range   Color, Urine YELLOW YELLOW   APPearance CLEAR CLEAR   Specific Gravity, Urine 1.020 1.005 -  1.030   pH 5.5 5.0 - 8.0   Glucose, UA NEGATIVE  NEGATIVE mg/dL   Hgb urine dipstick NEGATIVE NEGATIVE   Bilirubin Urine NEGATIVE NEGATIVE   Ketones, ur NEGATIVE NEGATIVE mg/dL   Protein, ur NEGATIVE NEGATIVE mg/dL   Nitrite NEGATIVE NEGATIVE   Leukocytes,Ua NEGATIVE NEGATIVE    Comment: Microscopic not done on urines with negative protein, blood, leukocytes, nitrite, or glucose < 500 mg/dL. Performed at [facility], [place] [address] [place], [place] [address]   Glucose, capillary     Status: Abnormal   Collection Time: 04/20/21  8:58 PM  Result Value Ref Range   Glucose-Capillary 190 (H) 70 - 99 mg/dL    Comment: Glucose reference range applies only to samples taken after fasting for at least 8 hours.   Comment 1 Notify RN    Comment 2 Document in Chart   Basic metabolic panel     Status: Abnormal   Collection Time: 04/21/21  3:35 AM  Result Value Ref Range   Sodium 136 135 - 145 mmol/L   Potassium 3.2 (L) 3.5 - 5.1 mmol/L   Chloride 102 98 - 111 mmol/L   CO2 22 22 - 32 mmol/L   Glucose, Bld 130 (H) 70 - 99 mg/dL    Comment: Glucose reference range applies only to samples taken after fasting for at least 8 hours.   BUN 76 (H) 8 - 23 mg/dL   Creatinine, Ser 2.03 (H) 0.61 - 1.24 mg/dL   Calcium 8.7 (L) 8.9 - 10.3 mg/dL   GFR, Estimated 32 (L) >60 mL/min    Comment: (NOTE) Calculated using the CKD-EPI Creatinine Equation (2021)    Anion gap 12 5 - 15    Comment: Performed at [facility] [address], [place], [place] [address]  CBC     Status: Abnormal   Collection Time: 04/21/21  3:35 AM  Result Value Ref Range   WBC 7.6 4.0 - 10.5 K/uL   RBC 3.91 (L) 4.22 - 5.81 MIL/uL   Hemoglobin 10.6 (L) 13.0 - 17.0 g/dL   HCT 33.3 (L) 39.0 - 52.0 %   MCV 85.2 80.0 - 100.0 fL   MCH 27.1 26.0 - 34.0 pg   MCHC 31.8 30.0 - 36.0 g/dL   RDW 14.6 11.5 - 15.5 %   Platelets 198 150 - 400 K/uL   nRBC 0.0 0.0 - 0.2 %    Comment: Performed at [facility], [place] [address], [place], [place] [address]  Glucose, capillary      Status: Abnormal   Collection Time: 04/21/21  5:50 AM  Result Value Ref Range   Glucose-Capillary 129 (H) 70 - 99 mg/dL    Comment: Glucose reference range applies only to samples taken after fasting for at least 8 hours.   Comment 1 Notify RN    Comment 2 Document in Chart   Cooxemetry Panel (carboxy, met, total hgb, O2 sat)     Status: Abnormal   Collection Time: 04/21/21 10:20 AM  Result Value Ref Range   Total hemoglobin 10.7 (L) 12.0 - 16.0 g/dL   O2 Saturation 75.9 %   Carboxyhemoglobin 1.5 0.5 - 1.5 %   Methemoglobin 0.8 0.0 - 1.5 %    Comment: Performed at [facility], [place] [address] [place], [place] [address]  Albumin, pleural or peritoneal fluid      Status: None   Collection Time: 04/21/21 11:39 AM  Result Value Ref Range  Albumin, Fluid 1.5 g/dL    Comment: (NOTE) No normal range established for this test Results should be evaluated in conjunction with serum values    Fluid Type-FALB CYTO PERI     Comment: Performed at [facility], [place] [address] [place], [place] [address]  Protein, pleural or peritoneal fluid     Status: None   Collection Time: 04/21/21 11:39 AM  Result Value Ref Range   Total protein, fluid 3.2 g/dL    Comment: (NOTE) No normal range established for this test Results should be evaluated in conjunction with serum values    Fluid Type-FTP CYTO PERI     Comment: Performed at [facility] [address], [place], [place] [address]  Gram stain     Status: None   Collection Time: 04/21/21 11:39 AM   Specimen: Peritoneal Washings  Result Value Ref Range   Specimen Description PERITONEAL FLUID    Special Requests NONE    Gram Stain      WBC PRESENT,BOTH PMN AND MONONUCLEAR NO ORGANISMS SEEN CYTOSPIN SMEAR Performed at [facility], [address], [place], [place] [address]    Report Status 04/21/2021 FINAL   Glucose, capillary     Status: Abnormal   Collection Time: 04/21/21 12:02 PM  Result Value Ref Range    Glucose-Capillary 156 (H) 70 - 99 mg/dL    Comment: Glucose reference range applies only to samples taken after fasting for at least 8 hours.  Glucose, capillary     Status: Abnormal   Collection Time: 04/21/21  4:46 PM  Result Value Ref Range   Glucose-Capillary 163 (H) 70 - 99 mg/dL    Comment: Glucose reference range applies only to samples taken after fasting for at least 8 hours.  Glucose, capillary     Status: Abnormal   Collection Time: 04/21/21  9:01 PM  Result Value Ref Range   Glucose-Capillary 159 (H) 70 - 99 mg/dL    Comment: Glucose reference range applies only to samples taken after fasting for at least 8 hours.   Comment 1 Notify RN    Comment 2 Document in Chart   Cooxemetry Panel (carboxy, met, total hgb, O2 sat)     Status: Abnormal   Collection Time: 04/22/21  5:32 AM  Result Value Ref Range   Total hemoglobin 10.5 (L) 12.0 - 16.0 g/dL   O2 Saturation 73.5 %   Carboxyhemoglobin 1.9 (H) 0.5 - 1.5 %   Methemoglobin 0.8 0.0 - 1.5 %    Comment: Performed at [facility], [place] [address], [place], [place] [address]  CBC     Status: Abnormal   Collection Time: 04/22/21  5:32 AM  Result Value Ref Range   WBC 8.7 4.0 - 10.5 K/uL   RBC 3.80 (L) 4.22 - 5.81 MIL/uL   Hemoglobin 10.7 (L) 13.0 - 17.0 g/dL   HCT 31.8 (L) 39.0 - 52.0 %   MCV 83.7 80.0 - 100.0 fL   MCH 28.2 26.0 - 34.0 pg   MCHC 33.6 30.0 - 36.0 g/dL   RDW 14.6 11.5 - 15.5 %   Platelets 177 150 - 400 K/uL   nRBC 0.0 0.0 - 0.2 %    Comment: Performed at [facility], [place] [address] [place], [place] [address]  Basic metabolic panel     Status: Abnormal   Collection Time: 04/22/21  5:32 AM  Result Value Ref Range   Sodium 136 135 - 145  mmol/L   Potassium 2.9 (L) 3.5 - 5.1 mmol/L   Chloride 101 98 - 111 mmol/L   CO2 24 22 - 32 mmol/L   Glucose, Bld 143 (H) 70 - 99 mg/dL    Comment: Glucose reference range applies only to samples taken after fasting for at least 8 hours.   BUN 69 (H) 8 - 23  mg/dL   Creatinine, Ser 1.94 (H) 0.61 - 1.24 mg/dL   Calcium 8.6 (L) 8.9 - 10.3 mg/dL   GFR, Estimated 33 (L) >60 mL/min    Comment: (NOTE) Calculated using the CKD-EPI Creatinine Equation (2021)    Anion gap 11 5 - 15    Comment: Performed at [facility] [address] [place], [place] [address]  Magnesium     Status: None   Collection Time: 04/22/21  5:32 AM  Result Value Ref Range   Magnesium 1.9 1.7 - 2.4 mg/dL    Comment: Performed at [facility] [address], [place], [place] [address]  Glucose, capillary     Status: Abnormal   Collection Time: 04/22/21  5:54 AM  Result Value Ref Range   Glucose-Capillary 141 (H) 70 - 99 mg/dL    Comment: Glucose reference range applies only to samples taken after fasting for at least 8 hours.   Comment 1 Notify RN    Comment 2 Document in Chart   Glucose, capillary     Status: Abnormal   Collection Time: 04/22/21  7:51 AM  Result Value Ref Range   Glucose-Capillary 124 (H) 70 - 99 mg/dL    Comment: Glucose reference range applies only to samples taken after fasting for at least 8 hours.    ECG   N/A  Telemetry   V-paced - Personally Reviewed  Radiology    DG Chest 2 View  Result Date: 04/20/2021 CLINICAL DATA:  Shortness of breath. EXAM: CHEST - 2 VIEW COMPARISON:  Chest x-rays dated 04/02/2021 and 09/21/2016. FINDINGS: Study is hypoinspiratory with crowding of the perihilar and bibasilar bronchovascular markings. Given the slightly low lung volumes, lungs are clear. No confluent opacity to suggest a developing pneumonia. No pleural effusion or pneumothorax is seen. Stable cardiomegaly. LEFT chest wall pacemaker/ICD apparatus in place. Osseous structures about the chest are unremarkable. IMPRESSION: Low lung volumes. No active cardiopulmonary disease. No evidence of pneumonia or pulmonary edema. Cardiomegaly. Electronically Signed   By: Franki Cabot M.D.   On: 04/20/2021 14:00   US Paracentesis  Result Date:  04/21/2021 INDICATION: Patient with history of heart failure, chronic kidney disease, ascites. Request received for diagnostic and therapeutic paracentesis up to 5 liters. EXAM: ULTRASOUND GUIDED DIAGNOSTIC AND THERAPEUTIC PARACENTESIS MEDICATIONS: 10 mL 1% lidocaine COMPLICATIONS: None immediate. PROCEDURE: Informed written consent was obtained from the patient after a discussion of the risks, benefits and alternatives to treatment. A timeout was performed prior to the initiation of the procedure. Initial ultrasound scanning demonstrates a large amount of ascites within the right lower abdominal quadrant. The right lower abdomen was prepped and draped in the usual sterile fashion. 1% lidocaine was used for local anesthesia. Following this, a 19 gauge, 10-cm, Yueh catheter was introduced. An ultrasound image was saved for documentation purposes. The paracentesis was performed. The catheter was removed and a dressing was applied. The patient tolerated the procedure well without immediate post procedural complication. Patient received post-procedure intravenous albumin; see nursing notes for details. FINDINGS: A total of approximately 5 liters of yellow fluid was removed. Samples were sent  to the laboratory as requested by the clinical team. IMPRESSION: Successful ultrasound-guided diagnostic and therapeutic paracentesis yielding 5 liters of peritoneal fluid. Read by: Rowe Jonovan, PA-C Electronically Signed   By: Sandi Mariscal M.D.   On: 04/21/2021 12:35   DG CHEST PORT 1 VIEW  Result Date: 04/21/2021 CLINICAL DATA:  Placement of PICC line EXAM: PORTABLE CHEST 1 VIEW COMPARISON:  04/20/2021 FINDINGS: Transverse diameter of heart is increased. Central pulmonary vessels are prominent. There is increase in interstitial markings in the parahilar regions and lower lung fields, possibly suggesting mild interstitial edema. There is no focal pulmonary consolidation. Costophrenic angles are clear. There is no pneumothorax.  Pacemaker battery is seen in the left infraclavicular region. There is interval placement of PICC line through the right upper extremity with its tip in the region of superior vena cava. Postsurgical changes are noted in the right shoulder. IMPRESSION: Cardiomegaly. Central pulmonary vessels are more prominent suggesting CHF. No new focal infiltrates are seen. Tip of PICC line is seen in the course of superior vena cava. Electronically Signed   By: Elmer Picker M.D.   On: 04/21/2021 09:52   [place] ASCITES (ABDOMEN LIMITED)  Result Date: 04/20/2021 CLINICAL DATA:  Ascites, abdominal distention. EXAM: LIMITED ABDOMEN ULTRASOUND FOR ASCITES TECHNIQUE: Limited ultrasound survey for ascites was performed in all four abdominal quadrants. COMPARISON:  09/01/2020. FINDINGS: Moderate to large ascites is noted in all 4 quadrants, the largest pocket in the right upper quadrant measuring up to 15.8 cm. IMPRESSION: Moderate to large ascites. Electronically Signed   By: Brett Fairy M.D.   On: 04/20/2021 22:44   [place] EKG SITE RITE  Result Date: 04/21/2021 If Site Rite image not attached, placement could not be confirmed due to current cardiac rhythm.   Cardiac Studies   Echo pending  Assessment   Principal Problem:   Acute on chronic combined systolic and diastolic CHF (congestive heart failure) (HCC) Active Problems:   Diabetes mellitus with chronic kidney disease ([place])   Essential hypertension   Persistent atrial fibrillation (HCC)   Cardiac device in situ   Hypokalemia   Acute renal failure superimposed on stage 3a chronic kidney disease (HCC)   Ascites   Acute right-sided heart failure (HCC)   Elevated troponin   Acute on chronic diastolic CHF (congestive heart failure) ([place])   Plan   Acute on chronic combined systolic and diastolic congestive heart failure -with predominant right heart failure symptoms Mr. Tomeo presents with 20 to 25 pound weight gain and progressive dyspnea on exertion  over the past several weeks and has notable lower extremity edema and increased abdominal girth with fluid wave concerning for ascites.  I am concerned he has developed right ventricular probable biventricular heart failure.  He has made little urine with his initial dose of IV Lasix.  Continue Lasix 80 IV twice daily  Will continue metolazone 2.5 mg daily Continue CO-OXs Large volume ascites removed 5L Will start milrinone 0.25 mcg/kg/min today Permanent atrial fibrillation Noted, on anticoagulation, status post pacemaker which is functioning appropriately. Status post permanent pacemaker V pacing 100% of the time -question pacemaker related cardiomyopathy Hypertension Appears well-controlled CKD 3A Creatinine improving slowly with diuresis  Time Spent Directly with Patient:  I have spent a total of 35 minutes with the patient reviewing hospital notes, telemetry, EKGs, labs and examining the patient as well as establishing an assessment and plan that was discussed personally with the patient.  > 50% of time was spent in direct patient care.  Length of Stay:  LOS: 1 day   Pixie Casino, MD, [facility], [place] Director of the Advanced Lipid Disorders &  Cardiovascular Risk Reduction Clinic Diplomate of the American Board of Clinical Lipidology Attending Cardiologist  Direct Dial: [phone]   Fax: [phone]  Website:  [email or web address] Marwan Lipe 04/22/2021, 9:07 AM

## 2021-04-22 NOTE — Progress Notes (Signed)
Mobility Specialist Progress Note    04/22/21 1418  Mobility  Bed Position Chair  Activity Transferred from bed to chair  Level of Assistance +2 (takes two people)  Assistive Device  (HHA)  Distance Ambulated (ft) 3 ft  Activity Response Tolerated fair  $Mobility charge 1 Mobility   Pt received in bed and agreeable. Left with RN present.   [facility] Mobility Specialist  M.S. 2C and 6E: [phone] M.S. 4E: [phone]

## 2021-04-22 NOTE — TOC Progression Note (Signed)
Transition of Care [facility]) - Progression Note    Patient Details  Name: Vincent Allen MRN: [id number] Date of Birth: December 01, 1933  Transition of Care [facility]) CM/SW Contact  Zenon Mayo, RN Phone Number: 04/22/2021, 8:35 AM  Clinical Narrative:     Transition of Care [facility]) Screening Note   Patient Details  Name: Vincent Allen Date of Birth: 10-21-1933   Transition of Care [facility]) CM/SW Contact:    Zenon Mayo, RN Phone Number: 04/22/2021, 8:36 AM    Transition of Care Department [facility]) has reviewed patient and no TOC needs have been identified at this time. We will continue to monitor patient advancement through interdisciplinary progression rounds. If new patient transition needs arise, please place a TOC consult.          Expected Discharge Plan and Services                                                 Social Determinants of Health (SDOH) Interventions    Readmission Risk Interventions No flowsheet data found.

## 2021-04-23 DIAGNOSIS — I5043 Acute on chronic combined systolic (congestive) and diastolic (congestive) heart failure: Secondary | ICD-10-CM | POA: Diagnosis not present

## 2021-04-23 LAB — GLUCOSE, CAPILLARY
Glucose-Capillary: 180 mg/dL — ABNORMAL HIGH (ref 70–99)
Glucose-Capillary: 208 mg/dL — ABNORMAL HIGH (ref 70–99)
Glucose-Capillary: 195 mg/dL — ABNORMAL HIGH (ref 70–99)
Glucose-Capillary: 190 mg/dL — ABNORMAL HIGH (ref 70–99)

## 2021-04-23 LAB — COOXEMETRY PANEL
Carboxyhemoglobin: 2 % — ABNORMAL HIGH (ref 0.5–1.5)
Methemoglobin: 0.6 % (ref 0.0–1.5)
O2 Saturation: 90.4 %
Total hemoglobin: 9.2 g/dL — ABNORMAL LOW (ref 12.0–16.0)

## 2021-04-23 LAB — BASIC METABOLIC PANEL
Anion gap: 9 (ref 5–15)
BUN: 72 mg/dL — ABNORMAL HIGH (ref 8–23)
CO2: 23 mmol/L (ref 22–32)
Calcium: 8.2 mg/dL — ABNORMAL LOW (ref 8.9–10.3)
Chloride: 98 mmol/L (ref 98–111)
Creatinine, Ser: 2.18 mg/dL — ABNORMAL HIGH (ref 0.61–1.24)
GFR, Estimated: 29 mL/min — ABNORMAL LOW (ref >60)
Glucose, Bld: 182 mg/dL — ABNORMAL HIGH (ref 70–99)
Potassium: 3.2 mmol/L — ABNORMAL LOW (ref 3.5–5.1)
Sodium: 130 mmol/L — ABNORMAL LOW (ref 135–145)

## 2021-04-23 LAB — MAGNESIUM: Magnesium: 1.9 mg/dL (ref 1.7–2.4)

## 2021-04-23 MED ORDER — FUROSEMIDE 10 MG/ML IJ SOLN
120.0000 mg | Freq: Two times a day (BID) | INTRAVENOUS | Status: DC
  Filled 2021-04-23: qty 12

## 2021-04-23 MED ORDER — FUROSEMIDE 10 MG/ML IJ SOLN
120.0000 mg | Freq: Two times a day (BID) | INTRAVENOUS | Status: DC
  Administered 2021-04-23 – 2021-04-30 (×15): 120 mg via INTRAVENOUS
  Filled 2021-04-23 (×2): qty 10
  Filled 2021-04-23 (×3): qty 12
  Filled 2021-04-23 (×2): qty 10
  Filled 2021-04-23: qty 12
  Filled 2021-04-23: qty 10
  Filled 2021-04-23: qty 12
  Filled 2021-04-23: qty 10
  Filled 2021-04-23: qty 12
  Filled 2021-04-23: qty 10
  Filled 2021-04-23: qty 2
  Filled 2021-04-23 (×3): qty 12
  Filled 2021-04-23: qty 10

## 2021-04-23 NOTE — Progress Notes (Signed)
Inpatient Diabetes Program Recommendations  AACE/ADA: New Consensus Statement on Inpatient Glycemic Control (2015)  Target Ranges:  Prepandial:   less than 140 mg/dL      Peak postprandial:   less than 180 mg/dL (1-2 hours)      Critically ill patients:  140 - 180 mg/dL   Lab Results  Component Value Date   GLUCAP 208 (H) 04/23/2021   HGBA1C 5.8 04/02/2021    Review of Glycemic Control  Latest Reference Range & Units 04/22/21 11:22 04/22/21 16:29 04/22/21 20:55 04/23/21 06:00 04/23/21 11:28  Glucose-Capillary 70 - 99 mg/dL 177 (H) 160 (H) 221 (H) 180 (H) 208 (H)   Diabetes history: DM 2 Outpatient Diabetes medications:  Glucotrol 5 mg bid, Januvia 25 mg bid Current orders for Inpatient glycemic control:  Novolog sensitive tid with meals and HS Inpatient Diabetes Program Recommendations:   If blood sugars remain>180 mg/dL, consider adding Semglee 5 units daily.   Thanks,  Adah Perl, RN, BC-ADM Inpatient Diabetes Coordinator Pager [phone]  (8a-5p)

## 2021-04-23 NOTE — Progress Notes (Signed)
Progress Note  Patient Name: Vincent Allen Date of Encounter: 04/23/2021  [facility] Cardiologist: Pixie Casino, MD   Subjective   No CP; dyspnea with some improvement  Inpatient Medications    Scheduled Meds:  apixaban  2.5 mg Oral BID   Chlorhexidine Gluconate Cloth  6 each Topical Daily   furosemide  80 mg Intravenous BID   insulin aspart  0-9 Units Subcutaneous TID AC & HS   metoprolol succinate  25 mg Oral BID   potassium chloride  40 mEq Oral Daily   sodium chloride flush  10-40 mL Intracatheter Q12H   Continuous Infusions:  milrinone 0.25 mcg/kg/min (04/23/21 0600)   PRN Meds: acetaminophen **OR** acetaminophen   Vital Signs    Vitals:   04/22/21 1200 04/22/21 2013 04/23/21 0424 04/23/21 0442  BP: (!) 120/56 (!) 111/48  (!) 109/47  Pulse: 61 60 (!) 54 (!) 59  Resp: 17 18 18 17   Temp:  97.6 F (36.4 C)  97.6 F (36.4 C)  TempSrc:  Oral  Oral  SpO2:  98%  98%  Weight:   106.6 kg   Height:        Intake/Output Summary (Last 24 hours) at 04/23/2021 0924 Last data filed at 04/23/2021 0829 Gross per 24 hour  Intake 1386.49 ml  Output 1350 ml  Net 36.49 ml   Last 3 Weights 04/23/2021 04/22/2021 04/21/2021  Weight (lbs) 235 lb 0.2 oz 230 lb 4.8 oz 244 lb 14.9 oz  Weight (kg) 106.6 kg 104.463 kg 111.1 kg      Telemetry    Atrial fibrillation with intermittent ventricular pacing; ? Failure to sense; rate controlled - Personally Reviewed  Physical Exam   GEN: No acute distress.   Neck: supple Cardiac: irregular Respiratory: Clear to auscultation bilaterally. GI: Soft, fluid wave noted MS: 3+ edema Neuro:  Nonfocal  Psych: Normal affect   Labs    High Sensitivity Troponin:   Recent Labs  Lab 04/20/21 1254 04/20/21 1348  TROPONINIHS 83* 93*     Chemistry Recent Labs  Lab 04/20/21 1254 04/20/21 1348 04/20/21 1810 04/21/21 0335 04/22/21 0532 04/23/21 0429  NA  --    < >  --  136 136 130*  K  --    < >  --  3.2* 2.9* 3.2*  CL  --     < >  --  102 101 98  CO2  --    < >  --  22 24 23   GLUCOSE  --    < >  --  130* 143* 182*  BUN  --    < >  --  76* 69* 72*  CREATININE  --    < >  --  2.03* 1.94* 2.18*  CALCIUM  --    < >  --  8.7* 8.6* 8.2*  MG 2.0  --   --   --  1.9 1.9  PROT  --   --  6.5  --   --   --   ALBUMIN  --   --  2.6*  --   --   --   AST  --   --  23  --   --   --   ALT  --   --  17  --   --   --   ALKPHOS  --   --  65  --   --   --   BILITOT  --   --  1.4*  --   --   --   GFRNONAA  --    < >  --  32* 33* 29*  ANIONGAP  --    < >  --  12 11 9    < > = values in this interval not displayed.    Hematology Recent Labs  Lab 04/20/21 1348 04/21/21 0335 04/22/21 0532  WBC 10.2 7.6 8.7  RBC 4.32 3.91* 3.80*  HGB 11.9* 10.6* 10.7*  HCT 37.0* 33.3* 31.8*  MCV 85.6 85.2 83.7  MCH 27.5 27.1 28.2  MCHC 32.2 31.8 33.6  RDW 14.6 14.6 14.6  PLT 233 198 177   BNP Recent Labs  Lab 04/20/21 1348  BNP 1,460.7*     Radiology    US Paracentesis  Result Date: 04/21/2021 INDICATION: Patient with history of heart failure, chronic kidney disease, ascites. Request received for diagnostic and therapeutic paracentesis up to 5 liters. EXAM: ULTRASOUND GUIDED DIAGNOSTIC AND THERAPEUTIC PARACENTESIS MEDICATIONS: 10 mL 1% lidocaine COMPLICATIONS: None immediate. PROCEDURE: Informed written consent was obtained from the patient after a discussion of the risks, benefits and alternatives to treatment. A timeout was performed prior to the initiation of the procedure. Initial ultrasound scanning demonstrates a large amount of ascites within the right lower abdominal quadrant. The right lower abdomen was prepped and draped in the usual sterile fashion. 1% lidocaine was used for local anesthesia. Following this, a 19 gauge, 10-cm, Yueh catheter was introduced. An ultrasound image was saved for documentation purposes. The paracentesis was performed. The catheter was removed and a dressing was applied. The patient tolerated the  procedure well without immediate post procedural complication. Patient received post-procedure intravenous albumin; see nursing notes for details. FINDINGS: A total of approximately 5 liters of yellow fluid was removed. Samples were sent to the laboratory as requested by the clinical team. IMPRESSION: Successful ultrasound-guided diagnostic and therapeutic paracentesis yielding 5 liters of peritoneal fluid. Read by: Rowe Harsha, PA-C Electronically Signed   By: Sandi Mariscal M.D.   On: 04/21/2021 12:35   DG CHEST PORT 1 VIEW  Result Date: 04/21/2021 CLINICAL DATA:  Placement of PICC line EXAM: PORTABLE CHEST 1 VIEW COMPARISON:  04/20/2021 FINDINGS: Transverse diameter of heart is increased. Central pulmonary vessels are prominent. There is increase in interstitial markings in the parahilar regions and lower lung fields, possibly suggesting mild interstitial edema. There is no focal pulmonary consolidation. Costophrenic angles are clear. There is no pneumothorax. Pacemaker battery is seen in the left infraclavicular region. There is interval placement of PICC line through the right upper extremity with its tip in the region of superior vena cava. Postsurgical changes are noted in the right shoulder. IMPRESSION: Cardiomegaly. Central pulmonary vessels are more prominent suggesting CHF. No new focal infiltrates are seen. Tip of PICC line is seen in the course of superior vena cava. Electronically Signed   By: Elmer Picker M.D.   On: 04/21/2021 09:52   ECHOCARDIOGRAM COMPLETE  Result Date: 04/22/2021    ECHOCARDIOGRAM REPORT   Patient Name:   Vincent Allen Date of Exam: 04/22/2021 Medical Rec #:  [id number]         Height:       72.0 in Accession #:    [id number]        Weight:       230.3 lb Date of Birth:  1934/02/01         BSA:          2.262 m Patient Age:  87 years          BP:           120/56 mmHg Patient Gender: M                 HR:           61 bpm. Exam Location:  Inpatient Procedure: 2D Echo,  Cardiac Doppler, Color Doppler and Intracardiac            Opacification Agent Indications:    CHF-Acute Diastolic  History:        Patient has prior history of Echocardiogram examinations, most                 recent 08/18/2020. Pacemaker, Arrythmias:Atrial Fibrillation and                 Bradycardia; Risk Factors:Hypertension, Diabetes and                 Dyslipidemia. CKD. DOE.  Sonographer:    Clayton Lefort RDCS (AE) Referring Phys: [id number] [facility]  Sonographer Comments: Suboptimal apical window. IMPRESSIONS  1. Windows are limited. Challenging to fully assess wall motion. LVEF approximately 40-45%. Left ventricular ejection fraction, by estimation, is 40 to 45%. The left ventricle has mildly decreased function. Left ventricular diastolic parameters were normal.  2. Right ventricular systolic function is mildly reduced. The right ventricular size is normal. There is moderately elevated pulmonary artery systolic pressure.  3. The mitral valve is grossly normal. No evidence of mitral valve regurgitation.  4. Tricuspid valve regurgitation is mild to moderate.  5. Aortic valve regurgitation is not visualized. Aortic valve sclerosis/calcification is present, without any evidence of aortic stenosis.  6. The inferior vena cava IVC not well visualized. Comparison(s): No significant change from prior study. FINDINGS  Left Ventricle: Windows are limited. Challenging to fully assess wall motion. LVEF approximately 40-45%. Left ventricular ejection fraction, by estimation, is 40 to 45%. The left ventricle has mildly decreased function. Definity contrast agent was given  IV to delineate the left ventricular endocardial borders. The left ventricular internal cavity size was normal in size. There is no left ventricular hypertrophy. Left ventricular diastolic parameters were normal. Right Ventricle: The right ventricular size is normal. No increase in right ventricular wall thickness. Right ventricular systolic function is  mildly reduced. There is moderately elevated pulmonary artery systolic pressure. The tricuspid regurgitant velocity is 3.09 m/s, and with an assumed right atrial pressure of 15 mmHg, the estimated right ventricular systolic pressure is 84.1 mmHg. Left Atrium: Left atrial size was normal in size. Right Atrium: Right atrial size was normal in size. Pericardium: There is no evidence of pericardial effusion. Mitral Valve: The mitral valve is grossly normal. No evidence of mitral valve regurgitation. Tricuspid Valve: The tricuspid valve is grossly normal. Tricuspid valve regurgitation is mild to moderate. Aortic Valve: Aortic valve regurgitation is not visualized. Aortic valve sclerosis/calcification is present, without any evidence of aortic stenosis. Aortic valve mean gradient measures 3.0 mmHg. Aortic valve peak gradient measures 5.1 mmHg. Aortic valve  area, by VTI measures 2.64 cm. Pulmonic Valve: The pulmonic valve was not well visualized. Pulmonic valve regurgitation is not visualized. Aorta: The aortic root and ascending aorta are structurally normal, with no evidence of dilitation. Venous: The inferior vena cava IVC not well visualized. IAS/Shunts: The interatrial septum was not well visualized. Additional Comments: A device lead is visualized.  LEFT VENTRICLE PLAX 2D LVIDd:         4.90  cm LVIDs:         3.60 cm LV PW:         1.10 cm LV IVS:        1.30 cm LVOT diam:     2.10 cm LV SV:         54 LV SV Index:   24 LVOT Area:     3.46 cm  RIGHT VENTRICLE            IVC RV Basal diam:  3.70 cm    IVC diam: 2.70 cm RV Mid diam:    3.70 cm RV S prime:     9.46 cm/s TAPSE (M-mode): 1.4 cm LEFT ATRIUM           Index        RIGHT ATRIUM           Index LA diam:      4.20 cm 1.86 cm/m   RA Area:     20.80 cm LA Vol (A2C): 47.6 ml 21.04 ml/m  RA Volume:   59.90 ml  26.48 ml/m LA Vol (A4C): 67.7 ml 29.93 ml/m  AORTIC VALVE AV Area (Vmax):    2.60 cm AV Area (Vmean):   2.34 cm AV Area (VTI):     2.64 cm AV  Vmax:           113.00 cm/s AV Vmean:          79.100 cm/s AV VTI:            0.203 m AV Peak Grad:      5.1 mmHg AV Mean Grad:      3.0 mmHg LVOT Vmax:         84.90 cm/s LVOT Vmean:        53.500 cm/s LVOT VTI:          0.155 m LVOT/AV VTI ratio: 0.76  AORTA Ao Root diam: 3.80 cm Ao Asc diam:  3.40 cm TRICUSPID VALVE TR Peak grad:   38.2 mmHg TR Vmax:        309.00 cm/s  SHUNTS Systemic VTI:  0.16 m Systemic Diam: 2.10 cm Phineas Inches Electronically signed by Phineas Inches Signature Date/Time: 04/22/2021/4:23:20 PM    Final      Patient Profile     88 y.o. male with past medical history of chronic combined systolic/diastolic congestive heart failure, persistent atrial fibrillation, diabetes mellitus, hypertension, previous pacemaker, chronic stage IIIa kidney disease admitted with acute on chronic combined systolic/diastolic congestive heart failure.  Echocardiogram this admission shows ejection fraction 40 to 45%, moderate pulmonary hypertension, mild to moderate tricuspid regurgitation.  Assessment & Plan    1 acute on chronic combined systolic/diastolic congestive heart failure-, coox this morning is 90. I/O -[id number] since admission. Wt 106.6 kg.  Patient remains markedly volume overloaded.  I will increase Lasix to 120 mg IV twice daily.  Continue to follow renal function.  We will continue milrinone for now as he does appear to have RV dysfunction on his echocardiogram.  There is likely a combination of congestive heart failure, worsening renal function and low albumin contributing to patient's volume excess.  Note he feels improved following paracentesis.  2 chronic stage IIIb kidney disease-we will continue to follow renal function closely with diuresis.  3 permanent atrial fibrillation-continue apixaban.  Will hold metoprolol in setting of acute CHF and follow heart rate.  4 previous pacemaker-we will have device interrogated.  For questions or updates, please contact [facility] Please  consult [email or web address] for contact info under        Signed, Kirk Ruths, MD  04/23/2021, 9:24 AM

## 2021-04-23 NOTE — Progress Notes (Addendum)
PROGRESS NOTE    Vincent Allen  YIR:[id number] DOB: 03-10-1934 DOA: 04/20/2021 PCP: [name], MD  Brief Narrative:Vincent Allen is a 88 y.o. male with medical history significant of CKD stage 3, T2DM, GERD, HTN, HLD, persistent Afib on eliquis, symptomatic bradycardia s/p PPM presenting with 3 week history of worsening shortness of breath with exertion, 20-25 pound weight gain In the ED his creatinine 2.0, BNP 1460, troponin 93, chest x-ray was unremarkable. -Started on IV Lasix, poor response, started milrinone -Paracentesis on 2/4-5 L drained  Subjective: -Feels about the same as yesterday, felt better after paracentesis  Assessment and Plan: * Acute on chronic combined systolic and diastolic CHF (congestive heart failure) ([place])- (present on admission) Moderate MR, right heart failure -last echo with EF 40-45%, moderate MR  -Improving predominantly after paracentesis, remains on IV Lasix 80 mg twice daily, he is -1.7 L  -Lasix increased to 120 mg twice daily, started on milrinone 2/5 -Underwent paracentesis 2/4, 5 L drained -Appreciate cardiology input  Elevated troponin- (present on admission) Mildly elevated with flat trend, do not suspect ACS -Likely demand ischemia in the setting of CHF  Ascites- (present on admission) - See discussion above, s/p paracentesis, -Suspect this is all related to right heart failure, he does not have history of other known liver disease or alcoholism, abdominal ultrasound 6/22 noted fatty liver  Acute renal failure superimposed on stage 3a chronic kidney disease ([place])- (present on admission) Cardiorenal syndrome  -baseline creatinine appears to be around 1.6-1.8 -Creatinine fluctuating, 2.1 today, monitor with diuresis,  Persistent atrial fibrillation ([place])- (present on admission) - Stable, continue Eliquis, Toprol on hold  Diabetes mellitus with chronic kidney disease ([place])- (present on admission) a1c of 5.8 in 03/2021 -CBG  stable, continue sliding scale insulin -Glucotrol and Januvia on hold  Hypokalemia- (present on admission) Replaced  Cardiac device in situ- (present on admission) V-pacing, follow cards recs   Essential hypertension- (present on admission) - BP stable, continue Toprol, amlodipine on hold -IV Lasix as above  DVT prophylaxis:eliquis Code Status: DNR Family Communication: Discussed with patient and daughter at bedside yesterday Disposition Plan: Home pending improvement in volume status  Consultants:  cards  Procedures: Paracentesis 2/4, 5 L drained  Antimicrobials:    Objective: Vitals:   04/22/21 1200 04/22/21 2013 04/23/21 0424 04/23/21 0442  BP: (!) 120/56 (!) 111/48  (!) 109/47  Pulse: 61 60 (!) 54 (!) 59  Resp: 17 18 18 17   Temp:  97.6 F (36.4 C)  97.6 F (36.4 C)  TempSrc:  Oral  Oral  SpO2:  98%  98%  Weight:   106.6 kg   Height:        Intake/Output Summary (Last 24 hours) at 04/23/2021 0954 Last data filed at 04/23/2021 5009 Gross per 24 hour  Intake 1386.49 ml  Output 1350 ml  Net 36.49 ml   Filed Weights   04/21/21 0225 04/22/21 0354 04/23/21 0424  Weight: 111.1 kg 104.5 kg 106.6 kg    Examination:  General exam: Pleasant elderly male sitting up in bed, AAOx3, hard of hearing HEENT: Positive JVD CVS: S1-S2, regular rhythm Lungs: Decreased breath sounds at the bases otherwise clear Abdomen: Obese, soft, nontender, less distended, positive fluid thrill Extremities: 2-3+ edema  Skin: No rashes, chronic changes in lower leg Psychiatry:  Mood & affect appropriate.     Data Reviewed:   CBC: Recent Labs  Lab 04/20/21 1348 04/21/21 0335 04/22/21 0532  WBC 10.2 7.6 8.7  NEUTROABS 7.3  --   --  HGB 11.9* 10.6* 10.7*  HCT 37.0* 33.3* 31.8*  MCV 85.6 85.2 83.7  PLT 233 198 297   Basic Metabolic Panel: Recent Labs  Lab 04/20/21 1254 04/20/21 1348 04/21/21 0335 04/22/21 0532 04/23/21 0429  NA  --  135 136 136 130*  K  --  3.2* 3.2*  2.9* 3.2*  CL  --  99 102 101 98  CO2  --  21* 22 24 23   GLUCOSE  --  168* 130* 143* 182*  BUN  --  75* 76* 69* 72*  CREATININE  --  2.08* 2.03* 1.94* 2.18*  CALCIUM  --  9.3 8.7* 8.6* 8.2*  MG 2.0  --   --  1.9 1.9   GFR: Estimated Creatinine Clearance: 31.3 mL/min (A) (by C-G formula based on SCr of 2.18 mg/dL (H)). Liver Function Tests: Recent Labs  Lab 04/20/21 1810  AST 23  ALT 17  ALKPHOS 65  BILITOT 1.4*  PROT 6.5  ALBUMIN 2.6*   No results for input(s): LIPASE, AMYLASE in the last 168 hours. No results for input(s): AMMONIA in the last 168 hours. Coagulation Profile: No results for input(s): INR, PROTIME in the last 168 hours. Cardiac Enzymes: No results for input(s): CKTOTAL, CKMB, CKMBINDEX, TROPONINI in the last 168 hours. BNP (last 3 results) No results for input(s): PROBNP in the last 8760 hours. HbA1C: No results for input(s): HGBA1C in the last 72 hours. CBG: Recent Labs  Lab 04/22/21 0751 04/22/21 1122 04/22/21 1629 04/22/21 2055 04/23/21 0600  GLUCAP 124* 177* 160* 221* 180*   Lipid Profile: No results for input(s): CHOL, HDL, LDLCALC, TRIG, CHOLHDL, LDLDIRECT in the last 72 hours. Thyroid Function Tests: No results for input(s): TSH, T4TOTAL, FREET4, T3FREE, THYROIDAB in the last 72 hours. Anemia Panel: No results for input(s): VITAMINB12, FOLATE, FERRITIN, TIBC, IRON, RETICCTPCT in the last 72 hours. Urine analysis:    Component Value Date/Time   COLORURINE YELLOW 04/20/2021 2041   APPEARANCEUR CLEAR 04/20/2021 2041   LABSPEC 1.020 04/20/2021 2041   PHURINE 5.5 04/20/2021 2041   GLUCOSEU NEGATIVE 04/20/2021 2041   GLUCOSEU NEGATIVE 08/10/2020 1513   HGBUR NEGATIVE 04/20/2021 2041   BILIRUBINUR NEGATIVE 04/20/2021 2041   BILIRUBINUR Negative 04/02/2021 1605   KETONESUR NEGATIVE 04/20/2021 2041   PROTEINUR NEGATIVE 04/20/2021 2041   UROBILINOGEN 0.2 04/02/2021 1605   UROBILINOGEN 1.0 08/10/2020 1513   NITRITE NEGATIVE 04/20/2021 2041    LEUKOCYTESUR NEGATIVE 04/20/2021 2041   Sepsis Labs: @LABRCNTIP (procalcitonin:4,lacticidven:4)  ) Recent Results (from the past 240 hour(s))  Resp Panel by RT-PCR (Flu A&B, Covid) Nasopharyngeal Swab     Status: None   Collection Time: 04/20/21  3:30 PM   Specimen: Nasopharyngeal Swab; Nasopharyngeal(NP) swabs in vial transport medium  Result Value Ref Range Status   SARS Coronavirus 2 by RT PCR NEGATIVE NEGATIVE Final    Comment: (NOTE) SARS-CoV-2 target nucleic acids are NOT DETECTED.  The SARS-CoV-2 RNA is generally detectable in upper respiratory specimens during the acute phase of infection. The lowest concentration of SARS-CoV-2 viral copies this assay can detect is 138 copies/mL. A negative result does not preclude SARS-Cov-2 infection and should not be used as the sole basis for treatment or other patient management decisions. A negative result may occur with  improper specimen collection/handling, submission of specimen other than nasopharyngeal swab, presence of viral mutation(s) within the areas targeted by this assay, and inadequate number of viral copies(<138 copies/mL). A negative result must be combined with clinical observations, patient history, and epidemiological information.  The expected result is Negative.  Fact Sheet for Patients:  EntrepreneurPulse.com.au  Fact Sheet for Healthcare Providers:  IncredibleEmployment.be  This test is no t yet approved or cleared by the [place] FDA and  has been authorized for detection and/or diagnosis of SARS-CoV-2 by FDA under an Emergency Use Authorization (EUA). This EUA will remain  in effect (meaning this test can be used) for the duration of the COVID-19 declaration under Section 564(b)(1) of the Act, 21 U.S.C.section 360bbb-3(b)(1), unless the authorization is terminated  or revoked sooner.       Influenza A by PCR NEGATIVE NEGATIVE Final   Influenza B by PCR NEGATIVE  NEGATIVE Final    Comment: (NOTE) The Xpert Xpress SARS-CoV-2/FLU/RSV plus assay is intended as an aid in the diagnosis of influenza from Nasopharyngeal swab specimens and should not be used as a sole basis for treatment. Nasal washings and aspirates are unacceptable for Xpert Xpress SARS-CoV-2/FLU/RSV testing.  Fact Sheet for Patients: EntrepreneurPulse.com.au  Fact Sheet for Healthcare Providers: IncredibleEmployment.be  This test is not yet approved or cleared by the [place] FDA and has been authorized for detection and/or diagnosis of SARS-CoV-2 by FDA under an Emergency Use Authorization (EUA). This EUA will remain in effect (meaning this test can be used) for the duration of the COVID-19 declaration under Section 564(b)(1) of the Act, 21 U.S.C. section 360bbb-3(b)(1), unless the authorization is terminated or revoked.  Performed at [facility], [address] [place], [place] [address]   Gram stain     Status: None   Collection Time: 04/21/21 11:39 AM   Specimen: Peritoneal Washings  Result Value Ref Range Status   Specimen Description PERITONEAL FLUID  Final   Special Requests NONE  Final   Gram Stain   Final    WBC PRESENT,BOTH PMN AND MONONUCLEAR NO ORGANISMS SEEN CYTOSPIN SMEAR Performed at [facility], [address], [place], [place] [address]    Report Status 04/21/2021 FINAL  Final  Culture, body fluid w Gram Stain-bottle     Status: None (Preliminary result)   Collection Time: 04/21/21 11:39 AM   Specimen: Peritoneal Washings  Result Value Ref Range Status   Specimen Description PERITONEAL FLUID  Final   Special Requests NONE  Final   Culture   Final    NO GROWTH 2 DAYS Performed at [facility] [address] [place], [place] [address]    Report Status PENDING  Incomplete     Radiology Studies: US Paracentesis  Result Date: 04/21/2021 INDICATION: Patient with history of heart failure,  chronic kidney disease, ascites. Request received for diagnostic and therapeutic paracentesis up to 5 liters. EXAM: ULTRASOUND GUIDED DIAGNOSTIC AND THERAPEUTIC PARACENTESIS MEDICATIONS: 10 mL 1% lidocaine COMPLICATIONS: None immediate. PROCEDURE: Informed written consent was obtained from the patient after a discussion of the risks, benefits and alternatives to treatment. A timeout was performed prior to the initiation of the procedure. Initial ultrasound scanning demonstrates a large amount of ascites within the right lower abdominal quadrant. The right lower abdomen was prepped and draped in the usual sterile fashion. 1% lidocaine was used for local anesthesia. Following this, a 19 gauge, 10-cm, Yueh catheter was introduced. An ultrasound image was saved for documentation purposes. The paracentesis was performed. The catheter was removed and a dressing was applied. The patient tolerated the procedure well without immediate post procedural complication. Patient received post-procedure intravenous albumin; see nursing notes for details. FINDINGS: A total of approximately 5 liters of  yellow fluid was removed. Samples were sent to the laboratory as requested by the clinical team. IMPRESSION: Successful ultrasound-guided diagnostic and therapeutic paracentesis yielding 5 liters of peritoneal fluid. Read by: Rowe Jacub, PA-C Electronically Signed   By: Sandi Mariscal M.D.   On: 04/21/2021 12:35   ECHOCARDIOGRAM COMPLETE  Result Date: 04/22/2021    ECHOCARDIOGRAM REPORT   Patient Name:   AQUILLA VOILES Date of Exam: 04/22/2021 Medical Rec #:  [id number]         Height:       72.0 in Accession #:    [id number]        Weight:       230.3 lb Date of Birth:  06/10/1933         BSA:          2.262 m Patient Age:    89 years          BP:           120/56 mmHg Patient Gender: M                 HR:           61 bpm. Exam Location:  Inpatient Procedure: 2D Echo, Cardiac Doppler, Color Doppler and Intracardiac             Opacification Agent Indications:    CHF-Acute Diastolic  History:        Patient has prior history of Echocardiogram examinations, most                 recent 08/18/2020. Pacemaker, Arrythmias:Atrial Fibrillation and                 Bradycardia; Risk Factors:Hypertension, Diabetes and                 Dyslipidemia. CKD. DOE.  Sonographer:    Clayton Lefort RDCS (AE) Referring Phys: [id number] [facility]  Sonographer Comments: Suboptimal apical window. IMPRESSIONS  1. Windows are limited. Challenging to fully assess wall motion. LVEF approximately 40-45%. Left ventricular ejection fraction, by estimation, is 40 to 45%. The left ventricle has mildly decreased function. Left ventricular diastolic parameters were normal.  2. Right ventricular systolic function is mildly reduced. The right ventricular size is normal. There is moderately elevated pulmonary artery systolic pressure.  3. The mitral valve is grossly normal. No evidence of mitral valve regurgitation.  4. Tricuspid valve regurgitation is mild to moderate.  5. Aortic valve regurgitation is not visualized. Aortic valve sclerosis/calcification is present, without any evidence of aortic stenosis.  6. The inferior vena cava IVC not well visualized. Comparison(s): No significant change from prior study. FINDINGS  Left Ventricle: Windows are limited. Challenging to fully assess wall motion. LVEF approximately 40-45%. Left ventricular ejection fraction, by estimation, is 40 to 45%. The left ventricle has mildly decreased function. Definity contrast agent was given  IV to delineate the left ventricular endocardial borders. The left ventricular internal cavity size was normal in size. There is no left ventricular hypertrophy. Left ventricular diastolic parameters were normal. Right Ventricle: The right ventricular size is normal. No increase in right ventricular wall thickness. Right ventricular systolic function is mildly reduced. There is moderately elevated pulmonary artery  systolic pressure. The tricuspid regurgitant velocity is 3.09 m/s, and with an assumed right atrial pressure of 15 mmHg, the estimated right ventricular systolic pressure is 67.1 mmHg. Left Atrium: Left atrial size was normal in size. Right Atrium: Right atrial size was normal in size.  Pericardium: There is no evidence of pericardial effusion. Mitral Valve: The mitral valve is grossly normal. No evidence of mitral valve regurgitation. Tricuspid Valve: The tricuspid valve is grossly normal. Tricuspid valve regurgitation is mild to moderate. Aortic Valve: Aortic valve regurgitation is not visualized. Aortic valve sclerosis/calcification is present, without any evidence of aortic stenosis. Aortic valve mean gradient measures 3.0 mmHg. Aortic valve peak gradient measures 5.1 mmHg. Aortic valve  area, by VTI measures 2.64 cm. Pulmonic Valve: The pulmonic valve was not well visualized. Pulmonic valve regurgitation is not visualized. Aorta: The aortic root and ascending aorta are structurally normal, with no evidence of dilitation. Venous: The inferior vena cava IVC not well visualized. IAS/Shunts: The interatrial septum was not well visualized. Additional Comments: A device lead is visualized.  LEFT VENTRICLE PLAX 2D LVIDd:         4.90 cm LVIDs:         3.60 cm LV PW:         1.10 cm LV IVS:        1.30 cm LVOT diam:     2.10 cm LV SV:         54 LV SV Index:   24 LVOT Area:     3.46 cm  RIGHT VENTRICLE            IVC RV Basal diam:  3.70 cm    IVC diam: 2.70 cm RV Mid diam:    3.70 cm RV S prime:     9.46 cm/s TAPSE (M-mode): 1.4 cm LEFT ATRIUM           Index        RIGHT ATRIUM           Index LA diam:      4.20 cm 1.86 cm/m   RA Area:     20.80 cm LA Vol (A2C): 47.6 ml 21.04 ml/m  RA Volume:   59.90 ml  26.48 ml/m LA Vol (A4C): 67.7 ml 29.93 ml/m  AORTIC VALVE AV Area (Vmax):    2.60 cm AV Area (Vmean):   2.34 cm AV Area (VTI):     2.64 cm AV Vmax:           113.00 cm/s AV Vmean:          79.100 cm/s AV  VTI:            0.203 m AV Peak Grad:      5.1 mmHg AV Mean Grad:      3.0 mmHg LVOT Vmax:         84.90 cm/s LVOT Vmean:        53.500 cm/s LVOT VTI:          0.155 m LVOT/AV VTI ratio: 0.76  AORTA Ao Root diam: 3.80 cm Ao Asc diam:  3.40 cm TRICUSPID VALVE TR Peak grad:   38.2 mmHg TR Vmax:        309.00 cm/s  SHUNTS Systemic VTI:  0.16 m Systemic Diam: 2.10 cm Phineas Inches Electronically signed by Phineas Inches Signature Date/Time: 04/22/2021/4:23:20 PM    Final      Scheduled Meds:  apixaban  2.5 mg Oral BID   Chlorhexidine Gluconate Cloth  6 each Topical Daily   insulin aspart  0-9 Units Subcutaneous TID AC & HS   potassium chloride  40 mEq Oral Daily   sodium chloride flush  10-40 mL Intracatheter Q12H   Continuous Infusions:  furosemide     milrinone 0.25 mcg/kg/min (  04/23/21 0600)     LOS: 2 days    Time spent: 30min  Domenic Polite, MD [facility]   04/23/2021, 9:54 AM

## 2021-04-24 DIAGNOSIS — I5043 Acute on chronic combined systolic (congestive) and diastolic (congestive) heart failure: Secondary | ICD-10-CM | POA: Diagnosis not present

## 2021-04-24 LAB — GLUCOSE, CAPILLARY
Glucose-Capillary: 172 mg/dL — ABNORMAL HIGH (ref 70–99)
Glucose-Capillary: 205 mg/dL — ABNORMAL HIGH (ref 70–99)
Glucose-Capillary: 171 mg/dL — ABNORMAL HIGH (ref 70–99)
Glucose-Capillary: 159 mg/dL — ABNORMAL HIGH (ref 70–99)

## 2021-04-24 LAB — COOXEMETRY PANEL
Carboxyhemoglobin: 2 % — ABNORMAL HIGH (ref 0.5–1.5)
Methemoglobin: 0.9 % (ref 0.0–1.5)
O2 Saturation: 77 %
Total hemoglobin: 9.6 g/dL — ABNORMAL LOW (ref 12.0–16.0)

## 2021-04-24 LAB — BASIC METABOLIC PANEL
Anion gap: 10 (ref 5–15)
BUN: 77 mg/dL — ABNORMAL HIGH (ref 8–23)
CO2: 24 mmol/L (ref 22–32)
Calcium: 8.5 mg/dL — ABNORMAL LOW (ref 8.9–10.3)
Chloride: 98 mmol/L (ref 98–111)
Creatinine, Ser: 2.37 mg/dL — ABNORMAL HIGH (ref 0.61–1.24)
GFR, Estimated: 26 mL/min — ABNORMAL LOW (ref >60)
Glucose, Bld: 165 mg/dL — ABNORMAL HIGH (ref 70–99)
Potassium: 3.4 mmol/L — ABNORMAL LOW (ref 3.5–5.1)
Sodium: 132 mmol/L — ABNORMAL LOW (ref 135–145)

## 2021-04-24 LAB — CYTOLOGY - NON PAP

## 2021-04-24 LAB — MAGNESIUM: Magnesium: 1.8 mg/dL (ref 1.7–2.4)

## 2021-04-24 MED ORDER — MAGNESIUM SULFATE 2 GM/50ML IV SOLN
INTRAVENOUS | Status: AC
  Filled 2021-04-24: qty 50

## 2021-04-24 MED ORDER — POTASSIUM CHLORIDE CRYS ER 20 MEQ PO TBCR
40.0000 meq | EXTENDED_RELEASE_TABLET | Freq: Two times a day (BID) | ORAL | Status: DC
  Administered 2021-04-24: 40 meq via ORAL
  Filled 2021-04-24: qty 2

## 2021-04-24 MED ORDER — MAGNESIUM SULFATE 2 GM/50ML IV SOLN
2.0000 g | Freq: Once | INTRAVENOUS | Status: AC
  Administered 2021-04-24: 2 g via INTRAVENOUS
  Filled 2021-04-24: qty 50

## 2021-04-24 MED ORDER — METOLAZONE 5 MG PO TABS
5.0000 mg | ORAL_TABLET | Freq: Once | ORAL | Status: AC
  Administered 2021-04-24: 5 mg via ORAL
  Filled 2021-04-24: qty 1

## 2021-04-24 MED ORDER — POTASSIUM CHLORIDE CRYS ER 20 MEQ PO TBCR
40.0000 meq | EXTENDED_RELEASE_TABLET | Freq: Once | ORAL | Status: AC
  Administered 2021-04-24: 40 meq via ORAL
  Filled 2021-04-24: qty 2

## 2021-04-24 MED ORDER — POTASSIUM CHLORIDE CRYS ER 20 MEQ PO TBCR
40.0000 meq | EXTENDED_RELEASE_TABLET | Freq: Two times a day (BID) | ORAL | Status: AC
Start: 2021-04-24 — End: 2021-04-25
  Administered 2021-04-24 – 2021-04-25 (×2): 40 meq via ORAL
  Filled 2021-04-24 (×2): qty 2

## 2021-04-24 NOTE — Progress Notes (Signed)
Pt had 7 beats of polymorphic V tach. Pt resting in bed and asymptomatic. Will pass on to day shift RN and page MD.

## 2021-04-24 NOTE — Progress Notes (Signed)
PROGRESS NOTE    Vincent Allen  OJJ:[id number] DOB: 1933/09/11 DOA: 04/20/2021 PCP: [name], MD  Brief Narrative:Vincent Allen is a 88 y.o. male with medical history significant of CKD stage 3, T2DM, GERD, HTN, HLD, persistent Afib on eliquis, symptomatic bradycardia s/p PPM presenting with 3 week history of worsening shortness of breath with exertion, 20-25 pound weight gain In the ED his creatinine 2.0, BNP 1460, troponin 93, chest x-ray was unremarkable. -Started on IV Lasix, poor response, started milrinone -Paracentesis on 2/4-->5 L drained  Subjective: -Feels poorly, tired, no considerable improvement in breathing after paracentesis  Assessment and Plan: * Acute on chronic combined systolic and diastolic CHF (congestive heart failure) ([place])- (present on admission) Moderate MR, right heart failure -last echo with EF 40-45%, moderate MR  -Lasix increased to 120 mg twice daily, started on milrinone 2/5 -Underwent paracentesis 2/4, 5 L drained -negative1.1 L, mild uptrend in creatinine as well -Appreciate cardiology input, poor response to diuretics so far, may need right heart cath  Elevated troponin- (present on admission) Mildly elevated with flat trend, do not suspect ACS -Likely demand ischemia in the setting of CHF  Ascites- (present on admission) - See discussion above, s/p paracentesis, -Suspect this is all related to right heart failure, he does not have history of other known liver disease or alcoholism, abdominal ultrasound 6/22 noted fatty liver  Acute renal failure superimposed on stage 3a chronic kidney disease ([place])- (present on admission) Cardiorenal syndrome  -baseline creatinine appears to be around 1.6-1.8 -Creatinine fluctuating, which, slight uptrend now, will monitor with diuresis -May need right heart cath  Persistent atrial fibrillation ([place])- (present on admission) - Stable, continue Eliquis, Toprol on hold  Diabetes mellitus with  chronic kidney disease ([place])- (present on admission) a1c of 5.8 in 03/2021 -CBG stable, continue sliding scale insulin -Glucotrol and Januvia on hold  Hypokalemia- (present on admission) Replaced  Cardiac device in situ- (present on admission) V-pacing, follow cards recs   Essential hypertension- (present on admission) - BP stable, Toprol, amlodipine on hold -IV Lasix as above  DVT prophylaxis:eliquis Code Status: DNR Family Communication: Discussed daughter  70/5  disposition Plan: Home pending improvement in volume status  Consultants:  cards  Procedures: Paracentesis 2/4, 5 L drained  Antimicrobials:    Objective: Vitals:   04/24/21 0248 04/24/21 0404 04/24/21 0825 04/24/21 1109  BP:  (!) 115/54 (!) 114/59 (!) 119/50  Pulse:  68 63 62  Resp:  15 20 18   Temp:  97.9 F (36.6 C) 98 F (36.7 C) (!) 97.5 F (36.4 C)  TempSrc:  Oral Oral Oral  SpO2:  97% 99% 97%  Weight: 107 kg     Height:        Intake/Output Summary (Last 24 hours) at 04/24/2021 1143 Last data filed at 04/24/2021 9371 Gross per 24 hour  Intake 1329.97 ml  Output 800 ml  Net 529.97 ml   Filed Weights   04/22/21 0354 04/23/21 0424 04/24/21 0248  Weight: 104.5 kg 106.6 kg 107 kg    Examination:  General exam: Pleasant elderly male laying in bed, AAOx3, hard of hearing HEENT: Positive JVD CVS: S1-S2, regular rate and Lungs: Decreased breath sound at the bases otherwise clear Abdomen: Obese, soft, nontender, bowel sounds present, less distended Extremities: 2-3+ edema  Skin: No rashes, chronic changes in lower leg Psychiatry:  Mood & affect appropriate.     Data Reviewed:   CBC: Recent Labs  Lab 04/20/21 1348 04/21/21 0335 04/22/21 0532  WBC 10.2 7.6 8.7  NEUTROABS 7.3  --   --   HGB 11.9* 10.6* 10.7*  HCT 37.0* 33.3* 31.8*  MCV 85.6 85.2 83.7  PLT 233 198 742   Basic Metabolic Panel: Recent Labs  Lab 04/20/21 1254 04/20/21 1348 04/21/21 0335 04/22/21 0532 04/23/21 0429  04/24/21 0412  NA  --  135 136 136 130* 132*  K  --  3.2* 3.2* 2.9* 3.2* 3.4*  CL  --  99 102 101 98 98  CO2  --  21* 22 24 23 24   GLUCOSE  --  168* 130* 143* 182* 165*  BUN  --  75* 76* 69* 72* 77*  CREATININE  --  2.08* 2.03* 1.94* 2.18* 2.37*  CALCIUM  --  9.3 8.7* 8.6* 8.2* 8.5*  MG 2.0  --   --  1.9 1.9 1.8   GFR: Estimated Creatinine Clearance: 28.8 mL/min (A) (by C-G formula based on SCr of 2.37 mg/dL (H)). Liver Function Tests: Recent Labs  Lab 04/20/21 1810  AST 23  ALT 17  ALKPHOS 65  BILITOT 1.4*  PROT 6.5  ALBUMIN 2.6*   No results for input(s): LIPASE, AMYLASE in the last 168 hours. No results for input(s): AMMONIA in the last 168 hours. Coagulation Profile: No results for input(s): INR, PROTIME in the last 168 hours. Cardiac Enzymes: No results for input(s): CKTOTAL, CKMB, CKMBINDEX, TROPONINI in the last 168 hours. BNP (last 3 results) No results for input(s): PROBNP in the last 8760 hours. HbA1C: No results for input(s): HGBA1C in the last 72 hours. CBG: Recent Labs  Lab 04/23/21 1128 04/23/21 1543 04/23/21 2043 04/24/21 0607 04/24/21 1106  GLUCAP 208* 195* 190* 172* 205*   Lipid Profile: No results for input(s): CHOL, HDL, LDLCALC, TRIG, CHOLHDL, LDLDIRECT in the last 72 hours. Thyroid Function Tests: No results for input(s): TSH, T4TOTAL, FREET4, T3FREE, THYROIDAB in the last 72 hours. Anemia Panel: No results for input(s): VITAMINB12, FOLATE, FERRITIN, TIBC, IRON, RETICCTPCT in the last 72 hours. Urine analysis:    Component Value Date/Time   COLORURINE YELLOW 04/20/2021 2041   APPEARANCEUR CLEAR 04/20/2021 2041   LABSPEC 1.020 04/20/2021 2041   PHURINE 5.5 04/20/2021 2041   GLUCOSEU NEGATIVE 04/20/2021 2041   GLUCOSEU NEGATIVE 08/10/2020 1513   HGBUR NEGATIVE 04/20/2021 2041   BILIRUBINUR NEGATIVE 04/20/2021 2041   BILIRUBINUR Negative 04/02/2021 1605   KETONESUR NEGATIVE 04/20/2021 2041   PROTEINUR NEGATIVE 04/20/2021 2041    UROBILINOGEN 0.2 04/02/2021 1605   UROBILINOGEN 1.0 08/10/2020 1513   NITRITE NEGATIVE 04/20/2021 2041   LEUKOCYTESUR NEGATIVE 04/20/2021 2041   Sepsis Labs: @LABRCNTIP (procalcitonin:4,lacticidven:4)  ) Recent Results (from the past 240 hour(s))  Resp Panel by RT-PCR (Flu A&B, Covid) Nasopharyngeal Swab     Status: None   Collection Time: 04/20/21  3:30 PM   Specimen: Nasopharyngeal Swab; Nasopharyngeal(NP) swabs in vial transport medium  Result Value Ref Range Status   SARS Coronavirus 2 by RT PCR NEGATIVE NEGATIVE Final    Comment: (NOTE) SARS-CoV-2 target nucleic acids are NOT DETECTED.  The SARS-CoV-2 RNA is generally detectable in upper respiratory specimens during the acute phase of infection. The lowest concentration of SARS-CoV-2 viral copies this assay can detect is 138 copies/mL. A negative result does not preclude SARS-Cov-2 infection and should not be used as the sole basis for treatment or other patient management decisions. A negative result may occur with  improper specimen collection/handling, submission of specimen other than nasopharyngeal swab, presence of viral mutation(s) within the areas  targeted by this assay, and inadequate number of viral copies(<138 copies/mL). A negative result must be combined with clinical observations, patient history, and epidemiological information. The expected result is Negative.  Fact Sheet for Patients:  EntrepreneurPulse.com.au  Fact Sheet for Healthcare Providers:  IncredibleEmployment.be  This test is no t yet approved or cleared by the [place] FDA and  has been authorized for detection and/or diagnosis of SARS-CoV-2 by FDA under an Emergency Use Authorization (EUA). This EUA will remain  in effect (meaning this test can be used) for the duration of the COVID-19 declaration under Section 564(b)(1) of the Act, 21 U.S.C.section 360bbb-3(b)(1), unless the authorization is terminated   or revoked sooner.       Influenza A by PCR NEGATIVE NEGATIVE Final   Influenza B by PCR NEGATIVE NEGATIVE Final    Comment: (NOTE) The Xpert Xpress SARS-CoV-2/FLU/RSV plus assay is intended as an aid in the diagnosis of influenza from Nasopharyngeal swab specimens and should not be used as a sole basis for treatment. Nasal washings and aspirates are unacceptable for Xpert Xpress SARS-CoV-2/FLU/RSV testing.  Fact Sheet for Patients: EntrepreneurPulse.com.au  Fact Sheet for Healthcare Providers: IncredibleEmployment.be  This test is not yet approved or cleared by the [place] FDA and has been authorized for detection and/or diagnosis of SARS-CoV-2 by FDA under an Emergency Use Authorization (EUA). This EUA will remain in effect (meaning this test can be used) for the duration of the COVID-19 declaration under Section 564(b)(1) of the Act, 21 U.S.C. section 360bbb-3(b)(1), unless the authorization is terminated or revoked.  Performed at [facility], [place] [address], [place], [place] [address]   Gram stain     Status: None   Collection Time: 04/21/21 11:39 AM   Specimen: Peritoneal Washings  Result Value Ref Range Status   Specimen Description PERITONEAL FLUID  Final   Special Requests NONE  Final   Gram Stain   Final    WBC PRESENT,BOTH PMN AND MONONUCLEAR NO ORGANISMS SEEN CYTOSPIN SMEAR Performed at [facility], [address] [place], [place] [address]    Report Status 04/21/2021 FINAL  Final  Culture, body fluid w Gram Stain-bottle     Status: None (Preliminary result)   Collection Time: 04/21/21 11:39 AM   Specimen: Peritoneal Washings  Result Value Ref Range Status   Specimen Description PERITONEAL FLUID  Final   Special Requests NONE  Final   Culture   Final    NO GROWTH 3 DAYS Performed at [facility] [address] [place], [place] [address]    Report Status PENDING  Incomplete      Radiology Studies: ECHOCARDIOGRAM COMPLETE  Result Date: 04/22/2021    ECHOCARDIOGRAM REPORT   Patient Name:   CASSIDY TABET Date of Exam: 04/22/2021 Medical Rec #:  [id number]         Height:       72.0 in Accession #:    [id number]        Weight:       230.3 lb Date of Birth:  Jan 07, 1934         BSA:          2.262 m Patient Age:    38 years          BP:           120/56 mmHg Patient Gender: M                 HR:  61 bpm. Exam Location:  Inpatient Procedure: 2D Echo, Cardiac Doppler, Color Doppler and Intracardiac            Opacification Agent Indications:    CHF-Acute Diastolic  History:        Patient has prior history of Echocardiogram examinations, most                 recent 08/18/2020. Pacemaker, Arrythmias:Atrial Fibrillation and                 Bradycardia; Risk Factors:Hypertension, Diabetes and                 Dyslipidemia. CKD. DOE.  Sonographer:    Clayton Lefort RDCS (AE) Referring Phys: [id number] [facility]  Sonographer Comments: Suboptimal apical window. IMPRESSIONS  1. Windows are limited. Challenging to fully assess wall motion. LVEF approximately 40-45%. Left ventricular ejection fraction, by estimation, is 40 to 45%. The left ventricle has mildly decreased function. Left ventricular diastolic parameters were normal.  2. Right ventricular systolic function is mildly reduced. The right ventricular size is normal. There is moderately elevated pulmonary artery systolic pressure.  3. The mitral valve is grossly normal. No evidence of mitral valve regurgitation.  4. Tricuspid valve regurgitation is mild to moderate.  5. Aortic valve regurgitation is not visualized. Aortic valve sclerosis/calcification is present, without any evidence of aortic stenosis.  6. The inferior vena cava IVC not well visualized. Comparison(s): No significant change from prior study. FINDINGS  Left Ventricle: Windows are limited. Challenging to fully assess wall motion. LVEF approximately 40-45%. Left ventricular  ejection fraction, by estimation, is 40 to 45%. The left ventricle has mildly decreased function. Definity contrast agent was given  IV to delineate the left ventricular endocardial borders. The left ventricular internal cavity size was normal in size. There is no left ventricular hypertrophy. Left ventricular diastolic parameters were normal. Right Ventricle: The right ventricular size is normal. No increase in right ventricular wall thickness. Right ventricular systolic function is mildly reduced. There is moderately elevated pulmonary artery systolic pressure. The tricuspid regurgitant velocity is 3.09 m/s, and with an assumed right atrial pressure of 15 mmHg, the estimated right ventricular systolic pressure is 94.7 mmHg. Left Atrium: Left atrial size was normal in size. Right Atrium: Right atrial size was normal in size. Pericardium: There is no evidence of pericardial effusion. Mitral Valve: The mitral valve is grossly normal. No evidence of mitral valve regurgitation. Tricuspid Valve: The tricuspid valve is grossly normal. Tricuspid valve regurgitation is mild to moderate. Aortic Valve: Aortic valve regurgitation is not visualized. Aortic valve sclerosis/calcification is present, without any evidence of aortic stenosis. Aortic valve mean gradient measures 3.0 mmHg. Aortic valve peak gradient measures 5.1 mmHg. Aortic valve  area, by VTI measures 2.64 cm. Pulmonic Valve: The pulmonic valve was not well visualized. Pulmonic valve regurgitation is not visualized. Aorta: The aortic root and ascending aorta are structurally normal, with no evidence of dilitation. Venous: The inferior vena cava IVC not well visualized. IAS/Shunts: The interatrial septum was not well visualized. Additional Comments: A device lead is visualized.  LEFT VENTRICLE PLAX 2D LVIDd:         4.90 cm LVIDs:         3.60 cm LV PW:         1.10 cm LV IVS:        1.30 cm LVOT diam:     2.10 cm LV SV:         54 LV  SV Index:   24 LVOT Area:      3.46 cm  RIGHT VENTRICLE            IVC RV Basal diam:  3.70 cm    IVC diam: 2.70 cm RV Mid diam:    3.70 cm RV S prime:     9.46 cm/s TAPSE (M-mode): 1.4 cm LEFT ATRIUM           Index        RIGHT ATRIUM           Index LA diam:      4.20 cm 1.86 cm/m   RA Area:     20.80 cm LA Vol (A2C): 47.6 ml 21.04 ml/m  RA Volume:   59.90 ml  26.48 ml/m LA Vol (A4C): 67.7 ml 29.93 ml/m  AORTIC VALVE AV Area (Vmax):    2.60 cm AV Area (Vmean):   2.34 cm AV Area (VTI):     2.64 cm AV Vmax:           113.00 cm/s AV Vmean:          79.100 cm/s AV VTI:            0.203 m AV Peak Grad:      5.1 mmHg AV Mean Grad:      3.0 mmHg LVOT Vmax:         84.90 cm/s LVOT Vmean:        53.500 cm/s LVOT VTI:          0.155 m LVOT/AV VTI ratio: 0.76  AORTA Ao Root diam: 3.80 cm Ao Asc diam:  3.40 cm TRICUSPID VALVE TR Peak grad:   38.2 mmHg TR Vmax:        309.00 cm/s  SHUNTS Systemic VTI:  0.16 m Systemic Diam: 2.10 cm Phineas Inches Electronically signed by Phineas Inches Signature Date/Time: 04/22/2021/4:23:20 PM    Final      Scheduled Meds:  apixaban  2.5 mg Oral BID   Chlorhexidine Gluconate Cloth  6 each Topical Daily   insulin aspart  0-9 Units Subcutaneous TID AC & HS   potassium chloride  40 mEq Oral BID   sodium chloride flush  10-40 mL Intracatheter Q12H   Continuous Infusions:  furosemide 120 mg (04/24/21 0842)   magnesium sulfate bolus IVPB 2 g (04/24/21 1133)   milrinone 0.25 mcg/kg/min (04/24/21 1136)     LOS: 3 days    Time spent: 57min  Domenic Polite, MD [facility]   04/24/2021, 11:43 AM

## 2021-04-24 NOTE — Progress Notes (Signed)
Progress Note  Patient Name: Vincent Allen Date of Encounter: 04/24/2021  [facility] Cardiologist: Pixie Casino, MD   Subjective   No CP; dyspnea continues to improve  Inpatient Medications    Scheduled Meds:  apixaban  2.5 mg Oral BID   Chlorhexidine Gluconate Cloth  6 each Topical Daily   insulin aspart  0-9 Units Subcutaneous TID AC & HS   potassium chloride  40 mEq Oral BID   sodium chloride flush  10-40 mL Intracatheter Q12H   Continuous Infusions:  furosemide 120 mg (04/24/21 0842)   milrinone 0.25 mcg/kg/min (04/24/21 1136)   PRN Meds: acetaminophen **OR** acetaminophen   Vital Signs    Vitals:   04/24/21 0248 04/24/21 0404 04/24/21 0825 04/24/21 1109  BP:  (!) 115/54 (!) 114/59 (!) 119/50  Pulse:  68 63 62  Resp:  15 20 18   Temp:  97.9 F (36.6 C) 98 F (36.7 C) (!) 97.5 F (36.4 C)  TempSrc:  Oral Oral Oral  SpO2:  97% 99% 97%  Weight: 107 kg     Height:        Intake/Output Summary (Last 24 hours) at 04/24/2021 1319 Last data filed at 04/24/2021 1309 Gross per 24 hour  Intake 1266.97 ml  Output 800 ml  Net 466.97 ml    Last 3 Weights 04/24/2021 04/23/2021 04/22/2021  Weight (lbs) 235 lb 14.4 oz 235 lb 0.2 oz 230 lb 4.8 oz  Weight (kg) 107.004 kg 106.6 kg 104.463 kg      Telemetry    Atrial fibrillation with intermittent ventricular pacing; rate controlled - Personally Reviewed  Physical Exam   GEN: No acute distress. Elderly Neck: Positive JVD Cardiac: irregular, normal rate Respiratory: CTA GI: Soft, fluid wave noted, no masses MS: 2+ edema Neuro: Grossly intact Psych: Normal affect   Labs    High Sensitivity Troponin:   Recent Labs  Lab 04/20/21 1254 04/20/21 1348  TROPONINIHS 83* 93*      Chemistry Recent Labs  Lab 04/20/21 1810 04/21/21 0335 04/22/21 0532 04/23/21 0429 04/24/21 0412  NA  --    < > 136 130* 132*  K  --    < > 2.9* 3.2* 3.4*  CL  --    < > 101 98 98  CO2  --    < > 24 23 24   GLUCOSE  --    <  > 143* 182* 165*  BUN  --    < > 69* 72* 77*  CREATININE  --    < > 1.94* 2.18* 2.37*  CALCIUM  --    < > 8.6* 8.2* 8.5*  MG  --   --  1.9 1.9 1.8  PROT 6.5  --   --   --   --   ALBUMIN 2.6*  --   --   --   --   AST 23  --   --   --   --   ALT 17  --   --   --   --   ALKPHOS 65  --   --   --   --   BILITOT 1.4*  --   --   --   --   GFRNONAA  --    < > 33* 29* 26*  ANIONGAP  --    < > 11 9 10    < > = values in this interval not displayed.     Hematology Recent Labs  Lab 04/20/21 1348  04/21/21 0335 04/22/21 0532  WBC 10.2 7.6 8.7  RBC 4.32 3.91* 3.80*  HGB 11.9* 10.6* 10.7*  HCT 37.0* 33.3* 31.8*  MCV 85.6 85.2 83.7  MCH 27.5 27.1 28.2  MCHC 32.2 31.8 33.6  RDW 14.6 14.6 14.6  PLT 233 198 177    BNP Recent Labs  Lab 04/20/21 1348  BNP 1,460.7*      Radiology    ECHOCARDIOGRAM COMPLETE  Result Date: 04/22/2021    ECHOCARDIOGRAM REPORT   Patient Name:   Vincent Allen Date of Exam: 04/22/2021 Medical Rec #:  [id number]         Height:       72.0 in Accession #:    [id number]        Weight:       230.3 lb Date of Birth:  07-Jul-1933         BSA:          2.262 m Patient Age:    66 years          BP:           120/56 mmHg Patient Gender: M                 HR:           61 bpm. Exam Location:  Inpatient Procedure: 2D Echo, Cardiac Doppler, Color Doppler and Intracardiac            Opacification Agent Indications:    CHF-Acute Diastolic  History:        Patient has prior history of Echocardiogram examinations, most                 recent 08/18/2020. Pacemaker, Arrythmias:Atrial Fibrillation and                 Bradycardia; Risk Factors:Hypertension, Diabetes and                 Dyslipidemia. CKD. DOE.  Sonographer:    Clayton Lefort RDCS (AE) Referring Phys: [id number] [facility]  Sonographer Comments: Suboptimal apical window. IMPRESSIONS  1. Windows are limited. Challenging to fully assess wall motion. LVEF approximately 40-45%. Left ventricular ejection fraction, by estimation, is 40  to 45%. The left ventricle has mildly decreased function. Left ventricular diastolic parameters were normal.  2. Right ventricular systolic function is mildly reduced. The right ventricular size is normal. There is moderately elevated pulmonary artery systolic pressure.  3. The mitral valve is grossly normal. No evidence of mitral valve regurgitation.  4. Tricuspid valve regurgitation is mild to moderate.  5. Aortic valve regurgitation is not visualized. Aortic valve sclerosis/calcification is present, without any evidence of aortic stenosis.  6. The inferior vena cava IVC not well visualized. Comparison(s): No significant change from prior study. FINDINGS  Left Ventricle: Windows are limited. Challenging to fully assess wall motion. LVEF approximately 40-45%. Left ventricular ejection fraction, by estimation, is 40 to 45%. The left ventricle has mildly decreased function. Definity contrast agent was given  IV to delineate the left ventricular endocardial borders. The left ventricular internal cavity size was normal in size. There is no left ventricular hypertrophy. Left ventricular diastolic parameters were normal. Right Ventricle: The right ventricular size is normal. No increase in right ventricular wall thickness. Right ventricular systolic function is mildly reduced. There is moderately elevated pulmonary artery systolic pressure. The tricuspid regurgitant velocity is 3.09 m/s, and with an assumed right atrial pressure of 15 mmHg, the estimated right ventricular systolic pressure is  53.2 mmHg. Left Atrium: Left atrial size was normal in size. Right Atrium: Right atrial size was normal in size. Pericardium: There is no evidence of pericardial effusion. Mitral Valve: The mitral valve is grossly normal. No evidence of mitral valve regurgitation. Tricuspid Valve: The tricuspid valve is grossly normal. Tricuspid valve regurgitation is mild to moderate. Aortic Valve: Aortic valve regurgitation is not visualized.  Aortic valve sclerosis/calcification is present, without any evidence of aortic stenosis. Aortic valve mean gradient measures 3.0 mmHg. Aortic valve peak gradient measures 5.1 mmHg. Aortic valve  area, by VTI measures 2.64 cm. Pulmonic Valve: The pulmonic valve was not well visualized. Pulmonic valve regurgitation is not visualized. Aorta: The aortic root and ascending aorta are structurally normal, with no evidence of dilitation. Venous: The inferior vena cava IVC not well visualized. IAS/Shunts: The interatrial septum was not well visualized. Additional Comments: A device lead is visualized.  LEFT VENTRICLE PLAX 2D LVIDd:         4.90 cm LVIDs:         3.60 cm LV PW:         1.10 cm LV IVS:        1.30 cm LVOT diam:     2.10 cm LV SV:         54 LV SV Index:   24 LVOT Area:     3.46 cm  RIGHT VENTRICLE            IVC RV Basal diam:  3.70 cm    IVC diam: 2.70 cm RV Mid diam:    3.70 cm RV S prime:     9.46 cm/s TAPSE (M-mode): 1.4 cm LEFT ATRIUM           Index        RIGHT ATRIUM           Index LA diam:      4.20 cm 1.86 cm/m   RA Area:     20.80 cm LA Vol (A2C): 47.6 ml 21.04 ml/m  RA Volume:   59.90 ml  26.48 ml/m LA Vol (A4C): 67.7 ml 29.93 ml/m  AORTIC VALVE AV Area (Vmax):    2.60 cm AV Area (Vmean):   2.34 cm AV Area (VTI):     2.64 cm AV Vmax:           113.00 cm/s AV Vmean:          79.100 cm/s AV VTI:            0.203 m AV Peak Grad:      5.1 mmHg AV Mean Grad:      3.0 mmHg LVOT Vmax:         84.90 cm/s LVOT Vmean:        53.500 cm/s LVOT VTI:          0.155 m LVOT/AV VTI ratio: 0.76  AORTA Ao Root diam: 3.80 cm Ao Asc diam:  3.40 cm TRICUSPID VALVE TR Peak grad:   38.2 mmHg TR Vmax:        309.00 cm/s  SHUNTS Systemic VTI:  0.16 m Systemic Diam: 2.10 cm Phineas Inches Electronically signed by Phineas Inches Signature Date/Time: 04/22/2021/4:23:20 PM    Final      Patient Profile     88 y.o. male with past medical history of chronic combined systolic/diastolic congestive heart failure, persistent  atrial fibrillation, diabetes mellitus, hypertension, previous pacemaker, chronic stage IIIa kidney disease admitted with acute on chronic combined systolic/diastolic congestive heart  failure.  Echocardiogram this admission shows ejection fraction 40 to 45%, moderate pulmonary hypertension, mild to moderate tricuspid regurgitation.  Assessment & Plan    1 acute on chronic combined systolic/diastolic congestive heart failure-, coox this morning is 77.  Not responding well to Lasix and patient remains markedly volume overloaded.  Continue Lasix 120 mg twice daily and add metolazone 5 mg.  Continue to follow renal function.  We will continue milrinone for now as he does appear to have RV dysfunction on his echocardiogram.  There is likely a combination of congestive heart failure, worsening renal function and low albumin contributing to patient's volume excess.    2 chronic stage IIIb kidney disease-we will continue to follow renal function closely with diuresis.  Creatinine slightly worse today.  3 permanent atrial fibrillation-continue apixaban.  Will hold metoprolol in setting of acute CHF and follow heart rate.  4 previous pacemaker-we will have device interrogated.  For questions or updates, please contact [facility] Please consult [email or web address] for contact info under        Signed, Kirk Ruths, MD  04/24/2021, 1:19 PM

## 2021-04-24 NOTE — Progress Notes (Signed)
Notified by RN, that Pt wants to be a Full COde now, called and d/w pt, he reiterated wanting to be shocked once or twice and an attempt at resuscitation, but not prolonged life support. -will DC DNR order  Domenic Polite, MD

## 2021-04-24 NOTE — Progress Notes (Signed)
Heart Failure Nurse Navigator Progress Note  PCP: [name], MD PCP-Cardiologist: Alma Friendly., MD Admission Diagnosis: a/c CHF Admitted from: home alone  Presentation:   Vincent Allen presented 2/3 increased SOB. Pt resting in bed at 40 degrees on room air. Patient interactive with interview process. Pt had 5L taken off via paracentesis this hospitalization. Lives home alone, has family that lives in town but doesn't feel they are helpful. States "no one can stand to live with me".  Pt states he has not been able to cook for himself so he has been eating out/delivery more in the past 4 weeks. Pt states he feels miserable and hasn't urinated much in the past day. Noted 358mL dark yellow urine and suction canister via external condom catheter (not documented in flowsheet at this time). Pt states he had diarrhea 3x last night using the [facility] with assistance. Pt legs are edematous, warm and ruddy. Requested unna boots from primary team, order placed. Pending patient improvement will plan for HV TOC clinic to optimize medication regimen and continue close cardiology follow up upon DC. Explained benefits of Heart & Vascular Transitions of Care Clinic appointment, patient agreeable.      ECHO/ LVEF: 40-45%, RV mildly reduced. Mod elevated PASP, mild/mod TR  Clinical Course:  Past Medical History:  Diagnosis Date   Arthritis    Bradycardia    Cataract    Chronic kidney disease stage III (GFR 30-59 ml/min) 05/03/2012   Colon polyps    Complication of anesthesia    emesis   Diabetes mellitus    GERD (gastroesophageal reflux disease)    HOH (hard of hearing)    HTN (hypertension) 01/13/2013   Hyperlipidemia    Hypertension    Pacemaker 12/2016   Persistent atrial fibrillation (HCC)    Presence of permanent cardiac pacemaker 09/20/2016   Symptomatic bradycardia    Vitamin D deficiency 07/17/2015     Social History   Socioeconomic History   Marital status: Widowed    Spouse name: Not  on file   Number of children: Not on file   Years of education: Not on file   Highest education level: Not on file  Occupational History   Not on file  Tobacco Use   Smoking status: Never   Smokeless tobacco: Never  Vaping Use   Vaping Use: Never used  Substance and Sexual Activity   Alcohol use: Yes    Alcohol/week: 1.0 standard drink    Types: 1 Glasses of wine per week   Drug use: No   Sexual activity: Not on file    Comment: lives alone , no major dietary restrictions, 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.  Other Topics Concern   Not on file  Social History Narrative   Not on file   Social Determinants of Health   Financial Resource Strain: Medium Risk   Difficulty of Paying Living Expenses: Somewhat hard  Food Insecurity: No Food Insecurity   Worried About Running Out of Food in the Last Year: Never true   Ran Out of Food in the Last Year: Never true  Transportation Needs: No Transportation Needs   Lack of Transportation (Medical): No   Lack of Transportation (Non-Medical): No  Physical Activity: Insufficiently Active   Days of Exercise per Week: 7 days   Minutes of Exercise per Session: 10 min  Stress: No Stress Concern Present   Feeling of Stress : Not at all  Social Connections: Socially Isolated  Frequency of Communication with Friends and Family: More than three times a week   Frequency of Social Gatherings with Friends and Family: Three times a week   Attends Religious Services: Never   Active Member of Clubs or Organizations: No   Attends Archivist Meetings: Never   Marital Status: Widowed    High Risk Criteria for Readmission and/or Poor Patient Outcomes: Heart failure hospital admissions (last 6 months): 1  No Show rate: 2% Difficult social situation: no Demonstrates medication adherence: yes Primary Language: English Literacy level: able to read/write and comprehend.   Barriers of Care:   -dietary  indiscretion  Considerations/Referrals:   Referral made to Heart Failure Pharmacist Stewardship: yes, appreciated Referral made to Heart Failure CSW/NCM TOC: no Referral made to Heart & Vascular TOC clinic: yes, 2/15 @ 9AM  Items for Follow-up on DC/TOC: -optimize -dietary modifications   Pricilla Holm, MSN, RN Heart Failure Nurse Navigator [phone]

## 2021-04-24 NOTE — Progress Notes (Signed)
Orthopedic Tech Progress Note Patient Details:  Vincent Allen 11-Apr-1933 [id number]  Ortho Devices Type of Ortho Device: Haematologist Ortho Device/Splint Location: BLE Ortho Device/Splint Interventions: Ordered, Application, Adjustment   Post Interventions Patient Tolerated: Well Instructions Provided: Care of [place] 04/24/2021, 6:35 PM

## 2021-04-25 ENCOUNTER — Inpatient Hospital Stay (HOSPITAL_COMMUNITY): Payer: PPO

## 2021-04-25 DIAGNOSIS — I5043 Acute on chronic combined systolic (congestive) and diastolic (congestive) heart failure: Secondary | ICD-10-CM | POA: Diagnosis not present

## 2021-04-25 LAB — COOXEMETRY PANEL
Carboxyhemoglobin: 1.5 % (ref 0.5–1.5)
Methemoglobin: 1 % (ref 0.0–1.5)
O2 Saturation: 69.1 %
Total hemoglobin: 9.7 g/dL — ABNORMAL LOW (ref 12.0–16.0)

## 2021-04-25 LAB — CBC
HCT: 28.1 % — ABNORMAL LOW (ref 39.0–52.0)
Hemoglobin: 9.1 g/dL — ABNORMAL LOW (ref 13.0–17.0)
MCH: 27.4 pg (ref 26.0–34.0)
MCHC: 32.4 g/dL (ref 30.0–36.0)
MCV: 84.6 fL (ref 80.0–100.0)
Platelets: 178 10*3/uL (ref 150–400)
RBC: 3.32 MIL/uL — ABNORMAL LOW (ref 4.22–5.81)
RDW: 14.5 % (ref 11.5–15.5)
WBC: 6.8 10*3/uL (ref 4.0–10.5)
nRBC: 0 % (ref 0.0–0.2)

## 2021-04-25 LAB — BASIC METABOLIC PANEL
Anion gap: 12 (ref 5–15)
BUN: 76 mg/dL — ABNORMAL HIGH (ref 8–23)
CO2: 22 mmol/L (ref 22–32)
Calcium: 8.2 mg/dL — ABNORMAL LOW (ref 8.9–10.3)
Chloride: 97 mmol/L — ABNORMAL LOW (ref 98–111)
Creatinine, Ser: 2.34 mg/dL — ABNORMAL HIGH (ref 0.61–1.24)
GFR, Estimated: 27 mL/min — ABNORMAL LOW (ref >60)
Glucose, Bld: 152 mg/dL — ABNORMAL HIGH (ref 70–99)
Potassium: 3.6 mmol/L (ref 3.5–5.1)
Sodium: 131 mmol/L — ABNORMAL LOW (ref 135–145)

## 2021-04-25 LAB — GLUCOSE, CAPILLARY
Glucose-Capillary: 181 mg/dL — ABNORMAL HIGH (ref 70–99)
Glucose-Capillary: 199 mg/dL — ABNORMAL HIGH (ref 70–99)

## 2021-04-25 LAB — MAGNESIUM: Magnesium: 1.9 mg/dL (ref 1.7–2.4)

## 2021-04-25 MED ORDER — POTASSIUM CHLORIDE CRYS ER 20 MEQ PO TBCR
40.0000 meq | EXTENDED_RELEASE_TABLET | Freq: Once | ORAL | Status: AC
Start: 2021-04-25 — End: 2021-04-25
  Administered 2021-04-25: 40 meq via ORAL
  Filled 2021-04-25: qty 2

## 2021-04-25 MED ORDER — METOLAZONE 5 MG PO TABS
5.0000 mg | ORAL_TABLET | Freq: Once | ORAL | Status: AC
  Administered 2021-04-25: 5 mg via ORAL
  Filled 2021-04-25: qty 1

## 2021-04-25 NOTE — Progress Notes (Signed)
°   04/25/21 1600 04/25/21 2000  Vitals  BP (!) 124/46 (!) 115/48  MAP (mmHg) 69  --   Pulse Rate 63 60  ECG Heart Rate 64 60  MEWS COLOR  MEWS Score Color Green Green  Oxygen Therapy  SpO2 (!) 85 %  --   O2 Device Room Air  --   Pain Assessment  Pain Scale 0-10  --   Pain Score 0  --   MEWS Score  MEWS Temp 0 0  MEWS Systolic 0 0  MEWS Pulse 0 0  MEWS RR 0 0  MEWS LOC 0 0  MEWS Score 0 0   Patient experiencing SOB with Desating applied 02 at 5l N/C. Daughter walked in. Called Rapid response and paged MD. Who rounded at the bedside.

## 2021-04-25 NOTE — Progress Notes (Addendum)
Progress Note  Patient Name: Vincent Allen Date of Encounter: 04/25/2021  [facility] Cardiologist: Pixie Casino, MD   Subjective   No acute overnight events. Patient still markedly volume overloaded. He had decent urinary output yesterday. He does not feel like his breathing has improved much and he still has his cough but he does states he feels better than yesterday. No chest pain.  Inpatient Medications    Scheduled Meds:  apixaban  2.5 mg Oral BID   Chlorhexidine Gluconate Cloth  6 each Topical Daily   insulin aspart  0-9 Units Subcutaneous TID AC & HS   sodium chloride flush  10-40 mL Intracatheter Q12H   Continuous Infusions:  furosemide 120 mg (04/25/21 0829)   milrinone 0.25 mcg/kg/min (04/25/21 0642)   PRN Meds: acetaminophen **OR** acetaminophen   Vital Signs    Vitals:   04/24/21 0825 04/24/21 1109 04/24/21 1947 04/24/21 2115  BP: (!) 114/59 (!) 119/50 (!) 124/58 (!) 123/50  Pulse: 63 62 66   Resp: 20 18 20 20   Temp: 98 F (36.7 C) (!) 97.5 F (36.4 C) 97.9 F (36.6 C)   TempSrc: Oral Oral Oral   SpO2: 99% 97% 97%   Weight:      Height:        Intake/Output Summary (Last 24 hours) at 04/25/2021 0914 Last data filed at 04/25/2021 0817 Gross per 24 hour  Intake 1229.03 ml  Output 2450 ml  Net -1220.97 ml   Last 3 Weights 04/24/2021 04/23/2021 04/22/2021  Weight (lbs) 235 lb 14.4 oz 235 lb 0.2 oz 230 lb 4.8 oz  Weight (kg) 107.004 kg 106.6 kg 104.463 kg      Telemetry    Underlying atrial fibrillation with intermittent ventricular pacing. Rates in the 29s. - Personally Reviewed  ECG    No new ECG tracing today. - Personally Reviewed  Physical Exam   GEN: No acute distress. Hard of hearing. Neck: Positive JVD. Cardiac: Irregularly irregular rhythm with normal rate.  No murmurs, rubs, or gallops.  Respiratory: Clear to auscultation bilaterally. No significant wheezes, rhonchi, or rales appreciated. GI: Soft and non-tender. MS: 2+ pitting  edema extending all the way up thighs. No deformities. Skin: Warm and dry. Neuro:  No focal deficits. Psych: Normal affect. Responds appropriately.   Labs    High Sensitivity Troponin:   Recent Labs  Lab 04/20/21 1254 04/20/21 1348  TROPONINIHS 83* 93*     Chemistry Recent Labs  Lab 04/20/21 1810 04/21/21 0335 04/22/21 0532 04/23/21 0429 04/24/21 0412 04/25/21 0237  NA  --    < > 136 130* 132* 131*  K  --    < > 2.9* 3.2* 3.4* 3.6  CL  --    < > 101 98 98 97*  CO2  --    < > 24 23 24 22   GLUCOSE  --    < > 143* 182* 165* 152*  BUN  --    < > 69* 72* 77* 76*  CREATININE  --    < > 1.94* 2.18* 2.37* 2.34*  CALCIUM  --    < > 8.6* 8.2* 8.5* 8.2*  MG  --   --  1.9 1.9 1.8  --   PROT 6.5  --   --   --   --   --   ALBUMIN 2.6*  --   --   --   --   --   AST 23  --   --   --   --   --  ALT 17  --   --   --   --   --   ALKPHOS 65  --   --   --   --   --   BILITOT 1.4*  --   --   --   --   --   GFRNONAA  --    < > 33* 29* 26* 27*  ANIONGAP  --    < > 11 9 10 12    < > = values in this interval not displayed.    Lipids No results for input(s): CHOL, TRIG, HDL, LABVLDL, LDLCALC, CHOLHDL in the last 168 hours.  Hematology Recent Labs  Lab 04/21/21 0335 04/22/21 0532 04/25/21 0237  WBC 7.6 8.7 6.8  RBC 3.91* 3.80* 3.32*  HGB 10.6* 10.7* 9.1*  HCT 33.3* 31.8* 28.1*  MCV 85.2 83.7 84.6  MCH 27.1 28.2 27.4  MCHC 31.8 33.6 32.4  RDW 14.6 14.6 14.5  PLT 198 177 178   Thyroid No results for input(s): TSH, FREET4 in the last 168 hours.  BNP Recent Labs  Lab 04/20/21 1348  BNP 1,460.7*    DDimer No results for input(s): DDIMER in the last 168 hours.   Radiology    No results found.  Cardiac Studies   Echocardiogram 04/22/2021: Impressions:  1. Windows are limited. Challenging to fully assess wall motion. LVEF approximately 40-45%. Left ventricular ejection fraction, by estimation, is 40 to 45%. The left ventricle has mildly decreased function. Left  ventricular  diastolic parameters were normal.   2. Right ventricular systolic function is mildly reduced. The right ventricular size is normal. There is moderately elevated pulmonary artery systolic pressure.   3. The mitral valve is grossly normal. No evidence of mitral valve regurgitation.   4. Tricuspid valve regurgitation is mild to moderate.   5. Aortic valve regurgitation is not visualized. Aortic valve  sclerosis/calcification is present, without any evidence of aortic stenosis.   6. The inferior vena cava IVC not well visualized.   Comparison(s): No significant change from prior study.   Patient Profile     88 y.o. male with a history of chronic combined CHF with EF of 40-45%, persistent atrial fibrillation, s/p PPM, hypertension, hyperlipidemia, type 2 diabetes mellitus, and CKD stage III who was admitted on 04/20/2021 with acute CHF after presenting with progressive shortness of breath and marked volume overload.  Assessment & Plan    Acute on Chronic Combined CHF Ascites BNP elevated at 1,460. Chest x-ray showed cardiomegaly with low lung volumes but no overt edema. Abdominal ultrasound showed moderate to large ascites. Echo showed LVEF of 40-45%, normal RV with mildly reduced systolic function, moderately elevated PASP, and mild to moderate TR. Patient is being aggressively IV diuresed with assistance of Milrinone. Currently on IV Lasix 120mg  twice daily as intermittent dosing of Metolazone (last dose 04/24/2021). Also underwent paracentesis on 04/21/2021 with removal of 5L of peritoneal fluid. Documented urinary output of 2.1 L yesterday but only net negative 1.9 L this admission. No updated weight today but down 11 lbs from admission yesterday. Renal function stable. CO-OX 69 today. - Still markedly volume overloaded on exam. - Continue IV Lasix 120mg  twice daily. Will likely need another dose of Metolazone today.  - No ACEi/ARB/ARNI or MRA due to renal function. - Can consider starting  beta-blocker when he is more compensated. - Continue to monitor daily weights, strict I/Os, and renal function.  - Likely a combination of CHF, worsening renal function, and low albumin contributing  to patient's volume excess.   Permanent Atrial Fibrillation Rate controlled. - No beta-blocker right now in setting of acute CHF. - Continue Eliquis 2.5 twice daily (reduced dose due to renal function and age)  Sinus Node Dysfunction S/p PPM Recent remote interrogation showed v-paced 99.7% of the time. - Followed by Dr. Caryl Comes  Hypertension BP well controlled. - Continue medications for CHF as above.  CKD Stage III-IV Creatinine 2.08 on admission. Baseline around 1.7 to 2.3.  - Stable at 2.34 today.  - Continue to monitor closely with diuresis.  Otherwise, per primary team.  For questions or updates, please contact [place] Please consult [email or web address] for contact info under    Signed, Darreld Mclean, PA-C  04/25/2021, 9:14 AM   Patient as above, patient seen and examined.  He remains significantly volume overloaded on exam.  Some improvement with diuresis with addition of metolazone yesterday.  We will give 5 mg today and continue Lasix at present dose.  Continue to follow renal function.  He is also on milrinone. Kirk Ruths, MD

## 2021-04-25 NOTE — Progress Notes (Signed)
°   04/25/21 2000  Vitals  BP (!) 115/48  MAP (mmHg) 68  Pulse Rate 60  ECG Heart Rate 60  Resp 15  MEWS COLOR  MEWS Score Color Green  Pain Assessment  Pain Scale 0-10  Pain Score 0  MEWS Score  MEWS Temp 0  MEWS Systolic 0  MEWS Pulse 0  MEWS RR 0  MEWS LOC 0  MEWS Score 0

## 2021-04-25 NOTE — Significant Event (Signed)
Patient experiencing Headache and paired PVC with non sustained Vtach 14 beats . Paged MD to make aware.

## 2021-04-25 NOTE — Progress Notes (Signed)
Progress Note   Patient: Vincent Allen RWE:[id number] DOB: [birth year] DOA: 04/20/2021     4 DOS: the patient was seen and examined on 04/25/2021   Brief hospital course: [name] was admitted to the hospital with the working diagnosis of decompensated heart failure.   88 yo male with the past medical history of CKD stage 3, T2DM, HTN, GERD, persistent atrial fibrillation and bradycardia sp pacemaker implantation who presented with dyspnea and weight gain. Reported 3 weeks of worsening dyspnea, along with 20 to 25 lbs weight gain, his nephrologist advised him to come to the ED. On his initial physical examination his blood pressure was 128/81, HR 65, RR 16, and oxygen saturation 100% on room air. Heart with S1 and S2 rhythmic, lungs with no rales or rhonchi, abdomen soft but distended, positive lower extremity edema +++.   Na 135, K 3,2 Cl 99, bicarb 21, glucose 168, bun 75 and cr 2,0  BNP 1,460 High sensitive troponin 83 and 93  Wbc 10,2 hgb 11,9, hct 37 and plt 233  SARS COVID 19 negative   Urine analysis with SG 1,020, negative leukocytes.   Chest radiograph with cardiomegaly with hilar vascular congestion and small left pleural effusion.   EKG 61 bpm, right axis deviation, QTc 541, ventricular paced rhythm with positive PVC, no significant ST segment or T wave changes.   Patient placed on aggressive diuresis with IV furosemide and started on inotropic support with IV milrinone.   02/04 paracentesis with 5 L removed with good toleration    Assessment and Plan Assessment and Plan: * Acute on chronic combined systolic and diastolic CHF (congestive heart failure) ([place])- (present on admission) Patient continue to have dyspnea.  Urine output over last 24 hrs is 6,195 ml Systolic blood pressure 093 to 120 mmHg.   Plan to continue aggressive diuresis with furosemide 120 mg bid and inotropic support with milrinone.  Continue with metolazone 5 mg dose today.  Lower extremities with  unna boots.  Patient had paracentesis for ascites, likely related to right sided heart failure.   Acute renal failure superimposed on stage 3a chronic kidney disease (Vincent Allen)- (present on admission) Hypokalemia hyponatremia Renal function with serum cr at 2,34 with K at 3,6 and Na 131 with serum bicarbonate at 22. Plan to continue diuresis for hypervolemia.  Avoid hypotension and nephrotoxic medications.   Essential hypertension- (present on admission) Continue close blood pressure monitoring, patient with low output heart failure.   Persistent atrial fibrillation (Vincent Allen)- (present on admission) Continue nticoagulation with apixaban.  Off Av blockade due to acute heart failure decompensation, on inotropic support.   Diabetes mellitus with chronic kidney disease (Vincent Allen)- (present on admission) His glucose has been stable with fasting glucose this am at 152. Capillary 171, 159 and 181 Plan to discontinue insulin therapy and continue capillary glucose monitoring as needed.  Patient is tolerating po well.   Cardiac device in situ- (present on admission) V-pacing, follow cards recs   Elevated troponin-resolved as of 04/25/2021, (present on admission) Mildly elevated with flat trend, do not suspect ACS -Likely demand ischemia in the setting of CHF        Subjective: Patient with no nausea or vomiting, no chest pain, continue to have dyspnea, and lower extremity edema, improved symptoms but not back to baseline.   Objective BP (!) 123/48 (BP Location: Left Arm)    Pulse 60    Temp 98 F (36.7 C) (Oral)    Resp 19    Ht 6' (  1.829 m)    Wt 107.9 kg    SpO2 97%    BMI 32.26 kg/m   Neurology  awake and alert ENT mild pallor but no icterus  Cardiovascular heart with S1 and S2 present, no gallops or murmurs No JVD, Positive lower extremity edema ++, unna boots place bilaterally  Pulmonary  lung with scattered rales but no wheezing, no rhonchi, on anterior auscultation  Abdominal soft and non  tender, mild distended  Skin Musculoskeletal   Data Reviewed:    Family Communication: no family at the bedside (friend)  Disposition: Status is: Inpatient Remains inpatient appropriate because: advanced heart failure management.                Author: Tawni Millers, MD 04/25/2021 2:03 PM  For on call review [email or web address].

## 2021-04-25 NOTE — Hospital Course (Addendum)
Mr. Schueller was admitted to the hospital with the working diagnosis of decompensated heart failure.   88 yo male with the past medical history of CKD stage 3, T2DM, HTN, GERD, persistent atrial fibrillation and bradycardia sp pacemaker implantation who presented with dyspnea and weight gain. Reported 3 weeks of worsening dyspnea, along with 20 to 25 lbs weight gain, his nephrologist advised him to come to the ED. On his initial physical examination his blood pressure was 128/81, HR 65, RR 16, and oxygen saturation 100% on room air. Heart with S1 and S2 rhythmic, lungs with no rales or rhonchi, abdomen soft but distended, positive lower extremity edema +++.   Na 135, K 3,2 Cl 99, bicarb 21, glucose 168, bun 75 and cr 2,0  BNP 1,460 High sensitive troponin 83 and 93  Wbc 10,2 hgb 11,9, hct 37 and plt 233  SARS COVID 19 negative   Urine analysis with SG 1,020, negative leukocytes.   Chest radiograph with cardiomegaly with hilar vascular congestion and small left pleural effusion.   EKG 61 bpm, right axis deviation, QTc 541, ventricular paced rhythm with positive PVC, no significant ST segment or T wave changes.   Patient placed on aggressive diuresis with IV furosemide and started on inotropic support with IV milrinone.   02/04 paracentesis with 5 L removed with good toleration   Patient slowly improving volume status but continue with significant hypervolemia, despite aggressive diuretic therapy and inotropic support.   02/12 patient with improvement in volume status with decreased edema and dyspnea.  Continue to be very weak and deconditioned, possible transfer to CIR  02/13 discontinue milrinone infusion and transitioned furosemide to oral route.  Improved hypervolemia, plan to transfer to CIR when bed available.

## 2021-04-25 NOTE — Progress Notes (Signed)
° °  Paged about 14 beats of non-sustained VT. Patient has a pacemaker and is v-paced 99.7% of the time. I reviewed telemetry but did not see any long runs of NSVT. Agree with checking Magnesium. I will give another dose of potassium this evening given aggressive diuresis with IV Lasix 120mg  twice daily and Metolazone. Asked RN to page on call APP if Magnesium level is abnormal.  Darreld Mclean, PA-C 04/25/2021 4:19 PM

## 2021-04-26 ENCOUNTER — Institutional Professional Consult (permissible substitution): Payer: PPO | Admitting: Internal Medicine

## 2021-04-26 DIAGNOSIS — E669 Obesity, unspecified: Secondary | ICD-10-CM | POA: Diagnosis present

## 2021-04-26 DIAGNOSIS — I5043 Acute on chronic combined systolic (congestive) and diastolic (congestive) heart failure: Secondary | ICD-10-CM | POA: Diagnosis not present

## 2021-04-26 DIAGNOSIS — E66811 Obesity, class 1: Secondary | ICD-10-CM | POA: Diagnosis present

## 2021-04-26 LAB — CULTURE, BODY FLUID W GRAM STAIN -BOTTLE: Culture: NO GROWTH

## 2021-04-26 LAB — GLUCOSE, CAPILLARY
Glucose-Capillary: 190 mg/dL — ABNORMAL HIGH (ref 70–99)
Glucose-Capillary: 258 mg/dL — ABNORMAL HIGH (ref 70–99)

## 2021-04-26 LAB — COOXEMETRY PANEL
Carboxyhemoglobin: 1.4 % (ref 0.5–1.5)
Methemoglobin: 0.9 % (ref 0.0–1.5)
O2 Saturation: 74 %
Total hemoglobin: 9.8 g/dL — ABNORMAL LOW (ref 12.0–16.0)

## 2021-04-26 LAB — BASIC METABOLIC PANEL
Anion gap: 9 (ref 5–15)
BUN: 73 mg/dL — ABNORMAL HIGH (ref 8–23)
CO2: 25 mmol/L (ref 22–32)
Calcium: 8.4 mg/dL — ABNORMAL LOW (ref 8.9–10.3)
Chloride: 99 mmol/L (ref 98–111)
Creatinine, Ser: 2.26 mg/dL — ABNORMAL HIGH (ref 0.61–1.24)
GFR, Estimated: 28 mL/min — ABNORMAL LOW (ref >60)
Glucose, Bld: 185 mg/dL — ABNORMAL HIGH (ref 70–99)
Potassium: 3.3 mmol/L — ABNORMAL LOW (ref 3.5–5.1)
Sodium: 133 mmol/L — ABNORMAL LOW (ref 135–145)

## 2021-04-26 LAB — MAGNESIUM: Magnesium: 1.8 mg/dL (ref 1.7–2.4)

## 2021-04-26 MED ORDER — POTASSIUM CHLORIDE CRYS ER 20 MEQ PO TBCR
40.0000 meq | EXTENDED_RELEASE_TABLET | Freq: Two times a day (BID) | ORAL | Status: DC
  Administered 2021-04-26 – 2021-05-03 (×15): 40 meq via ORAL
  Filled 2021-04-26 (×16): qty 2

## 2021-04-26 MED ORDER — METOLAZONE 5 MG PO TABS
5.0000 mg | ORAL_TABLET | Freq: Once | ORAL | Status: AC
  Administered 2021-04-26: 5 mg via ORAL
  Filled 2021-04-26: qty 1

## 2021-04-26 MED ORDER — MAGNESIUM SULFATE 2 GM/50ML IV SOLN
2.0000 g | Freq: Once | INTRAVENOUS | Status: AC
  Administered 2021-04-26: 2 g via INTRAVENOUS
  Filled 2021-04-26: qty 50

## 2021-04-26 NOTE — Care Management Important Message (Signed)
Important Message  Patient Details  Name: Vincent Allen MRN: [id number] Date of Birth: [birth year]   Medicare Important Message Given:  Yes     Shelda Altes 04/26/2021, 9:09 AM

## 2021-04-26 NOTE — Progress Notes (Signed)
Progress Note  Patient Name: Vincent Allen Date of Encounter: 04/26/2021  [facility] Cardiologist: Pixie Casino, MD   Subjective   Weak; no CP; not dyspneic at rest  Inpatient Medications    Scheduled Meds:  apixaban  2.5 mg Oral BID   Chlorhexidine Gluconate Cloth  6 each Topical Daily   sodium chloride flush  10-40 mL Intracatheter Q12H   Continuous Infusions:  furosemide 120 mg (04/25/21 1726)   milrinone 0.25 mcg/kg/min (04/25/21 2357)   PRN Meds: acetaminophen **OR** acetaminophen   Vital Signs    Vitals:   04/25/21 1130 04/25/21 1600 04/25/21 2000 04/26/21 0642  BP: (!) 123/48 (!) 124/46 (!) 115/48 (!) 120/50  Pulse: 60 63 60 61  Resp: 19  15 17   Temp: 98 F (36.7 C)  (!) 97.3 F (36.3 C) (!) 97.4 F (36.3 C)  TempSrc: Oral  Oral Oral  SpO2: 97% (!) 85% 90% 95%  Weight:    107.8 kg  Height:        Intake/Output Summary (Last 24 hours) at 04/26/2021 0830 Last data filed at 04/26/2021 0820 Gross per 24 hour  Intake 949.8 ml  Output 2400 ml  Net -1450.2 ml   Last 3 Weights 04/26/2021 04/25/2021 04/24/2021  Weight (lbs) 237 lb 10.5 oz 237 lb 14 oz 235 lb 14.4 oz  Weight (kg) 107.8 kg 107.9 kg 107.004 kg      Telemetry    Sinus - Personally Reviewed  Physical Exam   GEN: No acute distress.   Neck: supple Cardiac: RRR, no murmurs, rubs, or gallops.  Respiratory: Clear to auscultation bilaterally. GI: Soft, abdominal wall edema MS: 3+ edema Neuro:  Nonfocal  Psych: Normal affect   Labs    High Sensitivity Troponin:   Recent Labs  Lab 04/20/21 1254 04/20/21 1348  TROPONINIHS 83* 93*     Chemistry Recent Labs  Lab 04/20/21 1810 04/21/21 0335 04/24/21 0412 04/25/21 0237 04/25/21 1624 04/26/21 0453  NA  --    < > 132* 131*  --  133*  K  --    < > 3.4* 3.6  --  3.3*  CL  --    < > 98 97*  --  99  CO2  --    < > 24 22  --  25  GLUCOSE  --    < > 165* 152*  --  185*  BUN  --    < > 77* 76*  --  73*  CREATININE  --    < > 2.37*  2.34*  --  2.26*  CALCIUM  --    < > 8.5* 8.2*  --  8.4*  MG  --    < > 1.8  --  1.9 1.8  PROT 6.5  --   --   --   --   --   ALBUMIN 2.6*  --   --   --   --   --   AST 23  --   --   --   --   --   ALT 17  --   --   --   --   --   ALKPHOS 65  --   --   --   --   --   BILITOT 1.4*  --   --   --   --   --   GFRNONAA  --    < > 26* 27*  --  28*  ANIONGAP  --    < >  10 12  --  9   < > = values in this interval not displayed.     Hematology Recent Labs  Lab 04/21/21 0335 04/22/21 0532 04/25/21 0237  WBC 7.6 8.7 6.8  RBC 3.91* 3.80* 3.32*  HGB 10.6* 10.7* 9.1*  HCT 33.3* 31.8* 28.1*  MCV 85.2 83.7 84.6  MCH 27.1 28.2 27.4  MCHC 31.8 33.6 32.4  RDW 14.6 14.6 14.5  PLT 198 177 178     BNP Recent Labs  Lab 04/20/21 1348  BNP 1,460.7*      Radiology    DG Chest 1 View  Result Date: 04/25/2021 CLINICAL DATA:  Shortness of breath EXAM: CHEST  1 VIEW COMPARISON:  04/21/2021 FINDINGS: Left pacer remains in place, unchanged. Cardiomegaly. Left base linear densities, likely atelectasis. No overt edema or effusions. Right PICC line tip is in the SVC, unchanged. IMPRESSION: Cardiomegaly. Left base atelectasis. Electronically Signed   By: Rolm Baptise M.D.   On: 04/25/2021 19:04     Patient Profile     88 y.o. male with a history of chronic combined CHF with EF of 40-45%, persistent atrial fibrillation, s/p PPM, hypertension, hyperlipidemia, type 2 diabetes mellitus, and CKD stage III who was admitted on 04/20/2021 with acute CHF after presenting with progressive shortness of breath and marked volume overload.  Echocardiogram this admission shows ejection fraction 40 to 45%, mild RV dysfunction, moderate pulmonary hypertension, mild to moderate tricuspid regurgitation.  Assessment & Plan    1 acute on chronic combined systolic/diastolic congestive heart failure-I/O-1440; wt 107.8 kg.  Some improvement in response to diuretics.  We will continue milrinone at present dose.  Continue Lasix  120 mg IV twice daily.  We will give metolazone 5 mg today.  Continue to follow potassium and renal function closely.  2 chronic stage IIIb kidney disease-renal function is unchanged today.  We will continue to follow.  3 permanent atrial fibrillation-continue apixaban.  4 prior pacemaker  5 hypokalemia/hypomagnesemia-we will supplement.  For questions or updates, please contact [place] Please consult [email or web address] for contact info under        Signed, Kirk Ruths, MD  04/26/2021, 8:30 AM

## 2021-04-26 NOTE — Progress Notes (Signed)
Heart Failure Nurse Navigator Progress Note  Updated AVS with change to HV TOC clinic appt. Rescheduled for Monday 2/20 @ 11AM.  Pt continues on milrinone gtt and metolazone. OOB to chair today, feels weak.   Pricilla Holm, MSN, RN Heart Failure Nurse Navigator [phone]

## 2021-04-26 NOTE — Progress Notes (Signed)
Progress Note   Patient: Vincent Allen QHU:[id number] DOB: 03/10/1934 DOA: 04/20/2021     5 DOS: the patient was seen and examined on 04/26/2021   Brief hospital course: Mr. Leitz was admitted to the hospital with the working diagnosis of decompensated heart failure.   88 yo male with the past medical history of CKD stage 3, T2DM, HTN, GERD, persistent atrial fibrillation and bradycardia sp pacemaker implantation who presented with dyspnea and weight gain. Reported 3 weeks of worsening dyspnea, along with 20 to 25 lbs weight gain, his nephrologist advised him to come to the ED. On his initial physical examination his blood pressure was 128/81, HR 65, RR 16, and oxygen saturation 100% on room air. Heart with S1 and S2 rhythmic, lungs with no rales or rhonchi, abdomen soft but distended, positive lower extremity edema +++.   Na 135, K 3,2 Cl 99, bicarb 21, glucose 168, bun 75 and cr 2,0  BNP 1,460 High sensitive troponin 83 and 93  Wbc 10,2 hgb 11,9, hct 37 and plt 233  SARS COVID 19 negative   Urine analysis with SG 1,020, negative leukocytes.   Chest radiograph with cardiomegaly with hilar vascular congestion and small left pleural effusion.   EKG 61 bpm, right axis deviation, QTc 541, ventricular paced rhythm with positive PVC, no significant ST segment or T wave changes.   Patient placed on aggressive diuresis with IV furosemide and started on inotropic support with IV milrinone.   02/04 paracentesis with 5 L removed with good toleration   Patient slowly improving volume status but continue with significant hypervolemia, despite aggressive diuretic therapy and inotropic support.    Assessment and Plan: * Acute on chronic combined systolic and diastolic CHF (congestive heart failure) ([place])- (present on admission) Improvement in dyspnea, but continue to have edema at the lower extremities.  Urine output over last 24 hrs is 4,656 ml Systolic blood pressure 812 to 137  mmHg  Diuresis with furosemide 120 mg bid  Continue with inotropic support with milrinone.  Metolazone 5 mg dose today.  Out of bed to chair and continue with oral fluid restrictions.   Follow up chest film personally reviewed with improvement in pulmonary edema.   Acute renal failure superimposed on stage 3a chronic kidney disease ([place])- (present on admission) Hypokalemia hyponatremia Serum cr today is 2,26 with K at 3,3 and serum bicarbonate at 25 with Cl 99 and Na 133.  Plan to continue diuresis for hypervolemia. Follow up renal function in am.   Essential hypertension- (present on admission) Continue with aggressive diuresis for heart failure decompensation.   Persistent atrial fibrillation ([place])- (present on admission) Anticoagulation with apixaban. Holding AV blockade due to acute heart failure decompensation.  Continue with inotropic support.   Diabetes mellitus with chronic kidney disease ([place])- (present on admission) Patient with poor oral intake, his fasting glucose today was 185. Off insulin therapy Continue to check capillary glucose as needed.   Cardiac device in situ V-pacing, follow cards recs   Class 1 obesity Calculated BMI is 32,2   Elevated troponin-resolved as of 04/25/2021, (present on admission) Mildly elevated with flat trend, do not suspect ACS -Likely demand ischemia in the setting of CHF        Subjective: patient continue to be very weak and deconditioned, out of bed to chair, dyspnea slowly improving, but continue to have lower extremity edema  Physical Exam: Vitals:   04/25/21 1600 04/25/21 2000 04/26/21 0642 04/26/21 1055  BP: (!) 124/46 (!) 115/48 Marland Kitchen)  120/50 (!) 137/56  Pulse: 63 60 61 (!) 59  Resp:  15 17 18   Temp:  (!) 97.3 F (36.3 C) (!) 97.4 F (36.3 C) 97.6 F (36.4 C)  TempSrc:  Oral Oral Oral  SpO2: (!) 85% 90% 95% 97%  Weight:   107.8 kg   Height:       Neurology awake and alert ENT mild pallor Cardiovascular heart  with S1 and S2 present and rhythmic, no gallops or murmurs No JVD Lower extremities with unna boots bilaterally, positive pitting edema at the thighs bilaterally  Abdomen protuberant non tender or distended.   Data Reviewed:  Reviewed   Family Communication: no family at the bedside   Disposition: Status is: Inpatient Remains inpatient appropriate because: decompensated heart failure       Planned Discharge Destination: Home      Author: Tawni Millers, MD 04/26/2021 2:08 PM  For on call review [email or web address].

## 2021-04-26 NOTE — Assessment & Plan Note (Signed)
Calculated BMI is 32,2

## 2021-04-27 ENCOUNTER — Telehealth: Payer: PPO

## 2021-04-27 DIAGNOSIS — E669 Obesity, unspecified: Secondary | ICD-10-CM

## 2021-04-27 DIAGNOSIS — E440 Moderate protein-calorie malnutrition: Secondary | ICD-10-CM | POA: Diagnosis present

## 2021-04-27 DIAGNOSIS — I5043 Acute on chronic combined systolic (congestive) and diastolic (congestive) heart failure: Secondary | ICD-10-CM | POA: Diagnosis not present

## 2021-04-27 DIAGNOSIS — R778 Other specified abnormalities of plasma proteins: Secondary | ICD-10-CM

## 2021-04-27 LAB — BASIC METABOLIC PANEL
Anion gap: 10 (ref 5–15)
BUN: 69 mg/dL — ABNORMAL HIGH (ref 8–23)
CO2: 27 mmol/L (ref 22–32)
Calcium: 8.5 mg/dL — ABNORMAL LOW (ref 8.9–10.3)
Chloride: 95 mmol/L — ABNORMAL LOW (ref 98–111)
Creatinine, Ser: 2.05 mg/dL — ABNORMAL HIGH (ref 0.61–1.24)
GFR, Estimated: 31 mL/min — ABNORMAL LOW (ref >60)
Glucose, Bld: 232 mg/dL — ABNORMAL HIGH (ref 70–99)
Potassium: 3 mmol/L — ABNORMAL LOW (ref 3.5–5.1)
Sodium: 132 mmol/L — ABNORMAL LOW (ref 135–145)

## 2021-04-27 LAB — GLUCOSE, CAPILLARY
Glucose-Capillary: 168 mg/dL — ABNORMAL HIGH (ref 70–99)
Glucose-Capillary: 216 mg/dL — ABNORMAL HIGH (ref 70–99)

## 2021-04-27 LAB — COOXEMETRY PANEL
Carboxyhemoglobin: 1.6 % — ABNORMAL HIGH (ref 0.5–1.5)
Methemoglobin: 1 % (ref 0.0–1.5)
O2 Saturation: 70.7 %
Total hemoglobin: 9.9 g/dL — ABNORMAL LOW (ref 12.0–16.0)

## 2021-04-27 MED ORDER — ADULT MULTIVITAMIN W/MINERALS CH
1.0000 | ORAL_TABLET | Freq: Every day | ORAL | Status: DC
  Administered 2021-04-27 – 2021-05-03 (×7): 1 via ORAL
  Filled 2021-04-27 (×7): qty 1

## 2021-04-27 MED ORDER — GUAIFENESIN ER 600 MG PO TB12
600.0000 mg | ORAL_TABLET | Freq: Two times a day (BID) | ORAL | Status: DC
  Administered 2021-04-27 – 2021-05-03 (×12): 600 mg via ORAL
  Filled 2021-04-27 (×12): qty 1

## 2021-04-27 MED ORDER — POTASSIUM CHLORIDE CRYS ER 20 MEQ PO TBCR: 40.0000 meq | EXTENDED_RELEASE_TABLET | Freq: Once | ORAL | Status: DC

## 2021-04-27 MED ORDER — METOLAZONE 5 MG PO TABS
5.0000 mg | ORAL_TABLET | Freq: Once | ORAL | Status: AC
  Administered 2021-04-27: 5 mg via ORAL
  Filled 2021-04-27: qty 1

## 2021-04-27 MED ORDER — ENSURE ENLIVE PO LIQD
237.0000 mL | Freq: Two times a day (BID) | ORAL | Status: DC
  Administered 2021-04-28 – 2021-05-01 (×5): 237 mL via ORAL

## 2021-04-27 MED ORDER — POTASSIUM CHLORIDE CRYS ER 20 MEQ PO TBCR
40.0000 meq | EXTENDED_RELEASE_TABLET | Freq: Once | ORAL | Status: AC
  Administered 2021-04-27: 40 meq via ORAL
  Filled 2021-04-27: qty 2

## 2021-04-27 NOTE — Progress Notes (Signed)
? ?  Inpatient Rehab Admissions Coordinator : ? ?Per therapy recommendations, patient was screened for CIR candidacy by Kinzie Wickes RN MSN.  At this time patient appears to be a potential candidate for CIR. I will place a rehab consult per protocol for full assessment. Please call me with any questions. ? ?Elmira Olkowski RN MSN ?Admissions Coordinator ?[phone] ?  ?

## 2021-04-27 NOTE — Evaluation (Signed)
Physical Therapy Evaluation Patient Details Name: Vincent Allen MRN: [id number] DOB: Jan 24, 1934 Today's Date: 04/27/2021  History of Present Illness  Pt is an 88 y.o. male who presented 04/20/21 with x3 week hx of DOE and 20-25 lb weight gain. Pt admitted with volume overload and acute on chronic combined systolic/diastolic congestive heart failure, chronic stage IIIb kidney disease, and hypokalemia/hypomagnesemia. S/p paracentesis 2/4. PMH: CHF, afib, s/p PPM, HTN, HLD, DM2, CKD stage III   Clinical Impression  Pt presents with condition above and deficits mentioned below, see PT Problem List. PTA, he was mod I using a SPC for mobility, relying on his son and daughter to provide transportation and do the grocery shopping. Pt lives alone in a 1-level house with a level entry. Pt normally sleeps in his lift chair. Currently, pt displays deficits in gross overall strength, balance, activity tolerance, R knee flexion ROM (chronic), and L ankle ROM (chronic). Due to these deficits, he required maxA to come to stand from the low surface of a standard recliner and modA to take a few side steps with a RW to transfer to the bed. He is at high risk for falls. Due to pt's high motivation to return to being independent and living alone along with his drastic change in functional status, recommending pt receive intensive therapy in the AIR setting. Will continue to follow acutely.     Recommendations for follow up therapy are one component of a multi-disciplinary discharge planning process, led by the attending physician.  Recommendations may be updated based on patient status, additional functional criteria and insurance authorization.  Follow Up Recommendations Acute inpatient rehab (3hours/day)    Assistance Recommended at Discharge Frequent or constant Supervision/Assistance  Patient can return home with the following  A lot of help with walking and/or transfers;A lot of help with  bathing/dressing/bathroom;Assistance with cooking/housework;Assist for transportation    Equipment Recommendations BSC/3in1;Other (comment);Rolling walker (2 wheels) (tub bench)  Recommendations for Other Services  Rehab consult    Functional Status Assessment Patient has had a recent decline in their functional status and demonstrates the ability to make significant improvements in function in a reasonable and predictable amount of time.     Precautions / Restrictions Precautions Precautions: Fall Precaution Comments: HOH Restrictions Weight Bearing Restrictions: No      Mobility  Bed Mobility Overal bed mobility: Needs Assistance Bed Mobility: Sit to Supine       Sit to supine: Min assist   General bed mobility comments: Cues to lean laterally onto elbow, minA to lift and bring legs onto bed.    Transfers Overall transfer level: Needs assistance Equipment used: Rolling walker (2 wheels), 1 person hand held assist Transfers: Sit to/from Stand, Bed to chair/wheelchair/BSC Sit to Stand: Max assist   Step pivot transfers: Mod assist       General transfer comment: Limited R knee flexion impacting foot placement for transfers. Attempted to come to stand from recliner with RW, but pt unable to push up from recliner to clear buttocks. Altered method to PT anterior approach with pt's arms around PT's waist to facilitate anterior weight shift, maxA to power up to stand and extend hips, trunk, and knees and then transition hands from PT to RW. ModA to steady pt and cue to turn with steps to R chair > bed.    Ambulation/Gait Ambulation/Gait assistance: Mod assist Gait Distance (Feet): 4 Feet Assistive device: Rolling walker (2 wheels) Gait Pattern/deviations: Step-through pattern, Decreased stride length, Trunk flexed Gait velocity:  reduced Gait velocity interpretation: <1.31 ft/sec, indicative of household ambulator   General Gait Details: Pt with slow, unsteady gait with  RW, stepping towards his R chair > bed. ModA to steady pt and cue to turn.  Stairs            Wheelchair Mobility    Modified Rankin (Stroke Patients Only)       Balance Overall balance assessment: Needs assistance Sitting-balance support: No upper extremity supported, Feet supported Sitting balance-Leahy Scale: Fair Sitting balance - Comments: Static sitting EOB with supervision   Standing balance support: Bilateral upper extremity supported, Reliant on assistive device for balance, During functional activity Standing balance-Leahy Scale: Poor Standing balance comment: Reliant on RW and modA to stand.                             Pertinent Vitals/Pain Pain Assessment Pain Assessment: Faces Faces Pain Scale: Hurts little more Pain Location: R knee with flexion Pain Descriptors / Indicators: Grimacing, Guarding, Discomfort Pain Intervention(s): Limited activity within patient's tolerance, Monitored during session, Repositioned    Home Living Family/patient expects to be discharged to:: Private residence Living Arrangements: Alone Available Help at Discharge: Family;Available PRN/intermittently Type of Home: House Home Access: Level entry       Home Layout: One level Home Equipment: Cane - single point;Rolling Walker (2 wheels);Grab bars - toilet;Grab bars - tub/shower;Other (comment) (lift chair) Additional Comments: Pt sleeps in lift chair "90%" of the time, and it assists him to stand due to chronic R knee flexion ROM limitations. Daughter "does everything: and son "does nothing". Son is "supposed to be helping me" and be by almost daily, daughter comes to see him 2-3x/week.    Prior Function Prior Level of Function : Needs assist;History of Falls (last six months)       Physical Assist : ADLs (physical)     Mobility Comments: Pt mod I using SPC for mobility. x1 fall in which he slipped on water. ADLs Comments: Pt's daughter does the grocer shopping  and helps clean the house PRN. Pt's son provides transportation.     Hand Dominance        Extremity/Trunk Assessment   Upper Extremity Assessment Upper Extremity Assessment: Defer to OT evaluation    Lower Extremity Assessment Lower Extremity Assessment: RLE deficits/detail;LLE deficits/detail;Generalized weakness RLE Deficits / Details: Edema noted; denies numbness/tingling throughout; limitations in ankle DF/PF ROM due to hx of surgery and pins being placed; WFL knee ROM LLE Deficits / Details: Edema noted; denies numbness/tingling throughout; [facility] ankle ROM; limited knee flexion ROM with pt reporting chronic issues with knee    Cervical / Trunk Assessment Cervical / Trunk Assessment: Kyphotic  Communication   Communication: HOH  Cognition Arousal/Alertness: Awake/alert Behavior During Therapy: WFL for tasks assessed/performed Overall Cognitive Status: Within Functional Limits for tasks assessed                                 General Comments: Follows all commands appropriately.        General Comments      Exercises     Assessment/Plan    PT Assessment Patient needs continued PT services  PT Problem List Decreased strength;Decreased range of motion;Decreased activity tolerance;Decreased balance;Decreased mobility;Obesity;Pain       PT Treatment Interventions DME instruction;Gait training;Functional mobility training;Therapeutic activities;Balance training;Therapeutic exercise;Neuromuscular re-education;Patient/family education    PT Goals (Current  goals can be found in the Care Plan section)  Acute Rehab PT Goals Patient Stated Goal: to get better PT Goal Formulation: With patient Time For Goal Achievement: 05/11/21 Potential to Achieve Goals: Good    Frequency Min 3X/week     Co-evaluation               AM-PAC PT "6 Clicks" Mobility  Outcome Measure Help needed turning from your back to your side while in a flat bed without using  bedrails?: A Little Help needed moving from lying on your back to sitting on the side of a flat bed without using bedrails?: A Little Help needed moving to and from a bed to a chair (including a wheelchair)?: A Lot Help needed standing up from a chair using your arms (e.g., wheelchair or bedside chair)?: A Lot Help needed to walk in hospital room?: Total Help needed climbing 3-5 steps with a railing? : Total 6 Click Score: 12    End of Session Equipment Utilized During Treatment: Gait belt Activity Tolerance: Patient tolerated treatment well Patient left: in bed;with call bell/phone within reach;with bed alarm set   PT Visit Diagnosis: Unsteadiness on feet (R26.81);Other abnormalities of gait and mobility (R26.89);History of falling (Z91.81);Muscle weakness (generalized) (M62.81);Difficulty in walking, not elsewhere classified (R26.2);Pain Pain - Right/Left: Right Pain - part of body: Knee    Time: 1216-2446 PT Time Calculation (min) (ACUTE ONLY): 28 min   Charges:   PT Evaluation $PT Eval Moderate Complexity: 1 Mod PT Treatments $Therapeutic Activity: 8-22 mins        Moishe Spice, PT, DPT Acute Rehabilitation Services  Pager: [phone] Office: [phone]   Orvan Falconer 04/27/2021, 4:08 PM

## 2021-04-27 NOTE — TOC Progression Note (Addendum)
Transition of Care [facility]) - Progression Note    Patient Details  Name: PASTOR SGRO MRN: [id number] Date of Birth: [birth year]  Transition of Care [facility]) CM/SW Contact  Zenon Mayo, RN Phone Number: 04/27/2021, 3:32 PM  Clinical Narrative:    NCM spoke with patient at bedside, he lives alone, states his son or daughter will come by two to three times a week to see him.  He states son will take him to his MD apts, NCM offered choice for [facility] services if needed ,  physical therapy is working with him right now, he states he does not have a preference.  NCM made referral to Freddie Breech with [facility], will await PT recs.  , PT is rec CIR for patient, Jackquline Denmark will continue to follow also.    Expected Discharge Plan: [place] Barriers to Discharge: Continued Medical Work up  Expected Discharge Plan and Services Expected Discharge Plan: [place]   Discharge Planning Services: CM Consult Post Acute Care Choice: [place] arrangements for the past 2 months: Single Family Home                   DME Agency: NA       HH Arranged: RN, PT HH Agency: [facility] Date [place]: 04/27/21 Time [place]: 4825 Representative spoke with at [place]: [place] ([place]) Interventions    Readmission Risk Interventions No flowsheet data found.

## 2021-04-27 NOTE — Progress Notes (Addendum)
Progress Note  Patient Name: Vincent Allen Date of Encounter: 04/27/2021  [facility] HeartCare Cardiologist: Pixie Casino, MD   Subjective   Remains weak, no CP  Inpatient Medications    Scheduled Meds:  apixaban  2.5 mg Oral BID   Chlorhexidine Gluconate Cloth  6 each Topical Daily   potassium chloride  40 mEq Oral BID   potassium chloride  40 mEq Oral Once   sodium chloride flush  10-40 mL Intracatheter Q12H   Continuous Infusions:  furosemide 120 mg (04/26/21 1838)   milrinone 0.25 mcg/kg/min (04/27/21 0150)   PRN Meds: acetaminophen **OR** acetaminophen   Vital Signs    Vitals:   04/26/21 1055 04/26/21 1847 04/26/21 2000 04/27/21 0453  BP: (!) 137/56 (!) 137/56 (!) 129/52 (!) 121/51  Pulse: (!) 59 61 64 61  Resp: 18 18 20 18   Temp: 97.6 F (36.4 C) (!) 97.5 F (36.4 C) 97.8 F (36.6 C) 98.3 F (36.8 C)  TempSrc: Oral Oral Oral Oral  SpO2: 97% 99% 98% 96%  Weight:    104.5 kg  Height:        Intake/Output Summary (Last 24 hours) at 04/27/2021 1033 Last data filed at 04/27/2021 1003 Gross per 24 hour  Intake 780.03 ml  Output 3050 ml  Net -2269.97 ml    Last 3 Weights 04/27/2021 04/26/2021 04/25/2021  Weight (lbs) 230 lb 4.8 oz 237 lb 10.5 oz 237 lb 14 oz  Weight (kg) 104.463 kg 107.8 kg 107.9 kg      Telemetry    Sinus - Personally Reviewed  Physical Exam   GEN: Chronically ill appearing, NAD  Neck: supple; JVP difficult Cardiac: RRR Respiratory: CTA anteriorly GI: Soft, abdominal wall edema, no masses MS: 3+ edema Neuro:  Grossly intact Psych: Normal affect   Labs    High Sensitivity Troponin:   Recent Labs  Lab 04/20/21 1254 04/20/21 1348  TROPONINIHS 83* 93*      Chemistry Recent Labs  Lab 04/20/21 1810 04/21/21 0335 04/24/21 0412 04/25/21 0237 04/25/21 1624 04/26/21 0453 04/27/21 0500  NA  --    < > 132* 131*  --  133* 132*  K  --    < > 3.4* 3.6  --  3.3* 3.0*  CL  --    < > 98 97*  --  99 95*  CO2  --    < > 24 22   --  25 27  GLUCOSE  --    < > 165* 152*  --  185* 232*  BUN  --    < > 77* 76*  --  73* 69*  CREATININE  --    < > 2.37* 2.34*  --  2.26* 2.05*  CALCIUM  --    < > 8.5* 8.2*  --  8.4* 8.5*  MG  --    < > 1.8  --  1.9 1.8  --   PROT 6.5  --   --   --   --   --   --   ALBUMIN 2.6*  --   --   --   --   --   --   AST 23  --   --   --   --   --   --   ALT 17  --   --   --   --   --   --   ALKPHOS 65  --   --   --   --   --   --  BILITOT 1.4*  --   --   --   --   --   --   GFRNONAA  --    < > 26* 27*  --  28* 31*  ANIONGAP  --    < > 10 12  --  9 10   < > = values in this interval not displayed.      Hematology Recent Labs  Lab 04/21/21 0335 04/22/21 0532 04/25/21 0237  WBC 7.6 8.7 6.8  RBC 3.91* 3.80* 3.32*  HGB 10.6* 10.7* 9.1*  HCT 33.3* 31.8* 28.1*  MCV 85.2 83.7 84.6  MCH 27.1 28.2 27.4  MCHC 31.8 33.6 32.4  RDW 14.6 14.6 14.5  PLT 198 177 178      BNP Recent Labs  Lab 04/20/21 1348  BNP 1,460.7*       Radiology    DG Chest 1 View  Result Date: 04/25/2021 CLINICAL DATA:  Shortness of breath EXAM: CHEST  1 VIEW COMPARISON:  04/21/2021 FINDINGS: Left pacer remains in place, unchanged. Cardiomegaly. Left base linear densities, likely atelectasis. No overt edema or effusions. Right PICC line tip is in the SVC, unchanged. IMPRESSION: Cardiomegaly. Left base atelectasis. Electronically Signed   By: Rolm Baptise M.D.   On: 04/25/2021 19:04     Patient Profile     88 y.o. male with a history of chronic combined CHF with EF of 40-45%, persistent atrial fibrillation, s/p PPM, hypertension, hyperlipidemia, type 2 diabetes mellitus, and CKD stage III who was admitted on 04/20/2021 with acute CHF after presenting with progressive shortness of breath and marked volume overload.  Echocardiogram this admission shows ejection fraction 40 to 45%, mild RV dysfunction, moderate pulmonary hypertension, mild to moderate tricuspid regurgitation.  Assessment & Plan    1 acute on  chronic combined systolic/diastolic congestive heart failure-I/O -2273; wt 104.5 kg.  Diuresis much better over the last 24 hours.  We will continue milrinone at present dose.  Continue Lasix 120 mg IV twice daily.  We will give metolazone 5 mg today.  Continue to follow potassium and renal function closely.  2 chronic stage IIIb kidney disease-renal function improved today with diuresis.  We will continue to follow.  3 permanent atrial fibrillation-continue apixaban.  4 prior pacemaker-device with question failure to sense.  We will have device interrogated.  5 hypokalemia/hypomagnesemia-we will supplement.  For questions or updates, please contact [place] Please consult [email or web address] for contact info under        Signed, Kirk Ruths, MD  04/27/2021, 10:33 AM

## 2021-04-27 NOTE — Progress Notes (Signed)
Progress Note   Patient: Vincent Allen BJS:[id number] DOB: [birth year] DOA: 04/20/2021     6 DOS: the patient was seen and examined on 04/27/2021   Brief hospital course: Vincent Allen was admitted to the hospital with the working diagnosis of decompensated heart failure.   88 yo male with the past medical history of CKD stage 3, T2DM, HTN, GERD, persistent atrial fibrillation and bradycardia sp pacemaker implantation who presented with dyspnea and weight gain. Reported 3 weeks of worsening dyspnea, along with 20 to 25 lbs weight gain, his nephrologist advised him to come to the ED. On his initial physical examination his blood pressure was 128/81, HR 65, RR 16, and oxygen saturation 100% on room air. Heart with S1 and S2 rhythmic, lungs with no rales or rhonchi, abdomen soft but distended, positive lower extremity edema +++.   Na 135, K 3,2 Cl 99, bicarb 21, glucose 168, bun 75 and cr 2,0  BNP 1,460 High sensitive troponin 83 and 93  Wbc 10,2 hgb 11,9, hct 37 and plt 233  SARS COVID 19 negative   Urine analysis with SG 1,020, negative leukocytes.   Chest radiograph with cardiomegaly with hilar vascular congestion and small left pleural effusion.   EKG 61 bpm, right axis deviation, QTc 541, ventricular paced rhythm with positive PVC, no significant ST segment or T wave changes.   Patient placed on aggressive diuresis with IV furosemide and started on inotropic support with IV milrinone.   02/04 paracentesis with 5 L removed with good toleration   Patient slowly improving volume status but continue with significant hypervolemia, despite aggressive diuretic therapy and inotropic support.    Assessment and Plan: * Acute on chronic combined systolic and diastolic CHF (congestive heart failure) ([place])- (present on admission) Clinically is getting better, but continue to be volume overloaded.   Urine output over last 24 hrs is 3,050 ml His blood pressure continue to be stable with systolic  607 mmHg.   Plan to continue with diuresis with furosemide 120 mg bid  Inotropic support with milrinone.  Continue with Metolazone 5 mg.   Out of bed to chair as tolerated.  Consult PT and OT to improve mobility.   Acute renal failure superimposed on stage 3a chronic kidney disease ([place])- (present on admission) Hypokalemia hyponatremia Patient is responding to high dose furosemide along with inotropic support.   Na is 132, K is 3,0 and serum cr at 2,0 with bicarbonate at 27. Continue close monitoring of renal function and electrolytes.  Continue diuresis with furosemide and metolazone.    Essential hypertension- (present on admission) Patient is tolerating well diuresis and inotropic support.  Continue close follow up on blood pressure.   Persistent atrial fibrillation ([place])- (present on admission) Continue with apixaban for anticoagulation.  Continue with milrinone for inotropic support.   Diabetes mellitus with chronic kidney disease ([place])- (present on admission) Glucose continue to be stable with fasting glucose at 232 this am, capillary 190, 258 and 168.  Patient is tolerating po well.   Cardiac device in situ V-pacing, follow cards recs   Class 1 obesity Calculated BMI is 32,2   Elevated troponin-resolved as of 04/25/2021, (present on admission) Mildly elevated with flat trend, do not suspect ACS -Likely demand ischemia in the setting of CHF        Subjective: patient is feeling better but not back to his baseline, continue to have edema at his lower extremities, but dyspnea seems to be improving.   Physical Exam: Vitals:  04/26/21 2000 04/27/21 0453 04/27/21 0800 04/27/21 1056  BP: (!) 129/52 (!) 121/51 (!) 138/46 (!) 141/47  Pulse: 64 61  60  Resp: 20 18 18 20   Temp: 97.8 F (36.6 C) 98.3 F (36.8 C)  98.4 F (36.9 C)  TempSrc: Oral Oral  Oral  SpO2: 98% 96%  97%  Weight:  104.5 kg    Height:       Neurology patient is awake and alert ENT no  pallor Cardiovascular with Heart S1 and S2 present, irregularly irregular, with no gallops, positive murmur at the apex systolic.  Mild JVD Positive lower extremity edema at the thighs 2+, legs with unna boots in place.  Abdomen soft, protuberant   Data Reviewed:  Noted   Family Communication: no family at the beside   Disposition: Status is: Inpatient Remains inpatient appropriate because: heart failure management     Planned Discharge Destination: Home     Author: Tawni Millers, MD 04/27/2021 12:51 PM  For on call review [email or web address].

## 2021-04-27 NOTE — Progress Notes (Signed)
Initial Nutrition Assessment  DOCUMENTATION CODES:   Non-severe (moderate) malnutrition in context of chronic illness  INTERVENTION:   Multivitamin w/ minerals daily Ensure Enlive po BID, each supplement provides 350 kcal and 20 grams of protein. Encourage good PO intake  NUTRITION DIAGNOSIS:   Moderate Malnutrition related to chronic illness (CHF) as evidenced by moderate fat depletion, severe muscle depletion.  GOAL:   Patient will meet greater than or equal to 90% of their needs  MONITOR:   PO intake, Supplement acceptance, Weight trends, Labs  REASON FOR ASSESSMENT:   Consult Assessment of nutrition requirement/status  ASSESSMENT:   88 y.o. male presented to the ED with SOB and 20-25# weight gain. PMH includes T2DM, GERD, CKD III, HTN, and CHF. Pt admitted with CHF exacerbation and AKI on CKD.   2/04 - Paracentesis - removed 5 L  Pt reports that his appetite has been for a while now and that he practically has to force himself to eat. Pt reports that he lives alone and he tries to eat as much as he can.  Per EMR, pt intake includes: 2/7: Lunch 10%, Dinner 100% 2/8: Breakfast 100%, Lunch 100% 2/9: Breakfast 100%, Lunch 50%, Dinner 50% 2/10: Breakfast 75%  Pt reports that he drinks a supplement at home but could not recall the brand, states that he typically drinks 1/day. Discussed that we have Ensure here, pt agreeable.  Pt reports that his UBW is ~220# and that he is still currently more than that. Pt reports that he uses a can at home to ambulate with.   Admission Weight: 111.7 kg Current Weight: 104.5 kg  Medications reviewed and include: Potassium Chloride, IV lasix Labs reviewed: Sodium 132, Potassium 3.0, BUN 69, Creatinine 2.05, 24 hr CBG 168-258   UOP: 3050 mL x 24 hr I/O's: -6.3 L since admit  NUTRITION - FOCUSED PHYSICAL EXAM:  Flowsheet Row Most Recent Value  Orbital Region Moderate depletion  Upper Arm Region No depletion  Thoracic and Lumbar  Region No depletion  Buccal Region Moderate depletion  Temple Region Moderate depletion  Clavicle Bone Region Severe depletion  Clavicle and Acromion Bone Region Severe depletion  Scapular Bone Region Severe depletion  Dorsal Hand Mild depletion  Patellar Region Unable to assess  Anterior Thigh Region Unable to assess  Posterior Calf Region Unable to assess  Edema (RD Assessment) Moderate  Hair Reviewed  Eyes Reviewed  Mouth Reviewed  Skin Reviewed  Nails Reviewed       Diet Order:   Diet Order             Diet Carb Modified Fluid consistency: Thin; Room service appropriate? Yes; Fluid restriction: 1200 mL Fluid  Diet effective now                   EDUCATION NEEDS:   No education needs have been identified at this time  Skin:  Skin Assessment: Reviewed RN Assessment  Last BM:  2/7  Height:   Ht Readings from Last 1 Encounters:  04/20/21 6' (1.829 m)    Weight:   Wt Readings from Last 1 Encounters:  04/27/21 104.5 kg    Ideal Body Weight:  80.9 kg  BMI:  Body mass index is 31.23 kg/m.  Estimated Nutritional Needs:   Kcal:  2100-2300  Protein:  105-120 grams  Fluid:  >/= 2.1 L    Leng Montesdeoca Louie Casa, RD, LDN Clinical Dietitian See [facility] for contact information.

## 2021-04-28 LAB — MAGNESIUM: Magnesium: 1.8 mg/dL (ref 1.7–2.4)

## 2021-04-28 LAB — COOXEMETRY PANEL
Carboxyhemoglobin: 1.7 % — ABNORMAL HIGH (ref 0.5–1.5)
Methemoglobin: 0.9 % (ref 0.0–1.5)
O2 Saturation: 74 %
Total hemoglobin: 10.1 g/dL — ABNORMAL LOW (ref 12.0–16.0)

## 2021-04-28 LAB — BASIC METABOLIC PANEL
Anion gap: 8 (ref 5–15)
BUN: 68 mg/dL — ABNORMAL HIGH (ref 8–23)
CO2: 28 mmol/L (ref 22–32)
Calcium: 8.4 mg/dL — ABNORMAL LOW (ref 8.9–10.3)
Chloride: 96 mmol/L — ABNORMAL LOW (ref 98–111)
Creatinine, Ser: 2.03 mg/dL — ABNORMAL HIGH (ref 0.61–1.24)
GFR, Estimated: 32 mL/min — ABNORMAL LOW (ref >60)
Glucose, Bld: 235 mg/dL — ABNORMAL HIGH (ref 70–99)
Potassium: 3.1 mmol/L — ABNORMAL LOW (ref 3.5–5.1)
Sodium: 132 mmol/L — ABNORMAL LOW (ref 135–145)

## 2021-04-28 LAB — GLUCOSE, CAPILLARY
Glucose-Capillary: 201 mg/dL — ABNORMAL HIGH (ref 70–99)
Glucose-Capillary: 265 mg/dL — ABNORMAL HIGH (ref 70–99)

## 2021-04-28 MED ORDER — POTASSIUM CHLORIDE CRYS ER 20 MEQ PO TBCR
40.0000 meq | EXTENDED_RELEASE_TABLET | Freq: Two times a day (BID) | ORAL | Status: AC
  Administered 2021-04-28 (×2): 40 meq via ORAL
  Filled 2021-04-28 (×2): qty 2

## 2021-04-28 MED ORDER — EMPAGLIFLOZIN 10 MG PO TABS
10.0000 mg | ORAL_TABLET | Freq: Every day | ORAL | Status: DC
  Administered 2021-04-28 – 2021-05-03 (×6): 10 mg via ORAL
  Filled 2021-04-28 (×6): qty 1

## 2021-04-28 MED ORDER — MAGNESIUM OXIDE -MG SUPPLEMENT 400 (240 MG) MG PO TABS
400.0000 mg | ORAL_TABLET | Freq: Two times a day (BID) | ORAL | Status: AC
  Administered 2021-04-28 (×2): 400 mg via ORAL
  Filled 2021-04-28 (×2): qty 1

## 2021-04-28 MED ORDER — INSULIN ASPART 100 UNIT/ML IJ SOLN
0.0000 [IU] | Freq: Three times a day (TID) | INTRAMUSCULAR | Status: DC
  Administered 2021-04-28: 8 [IU] via SUBCUTANEOUS
  Administered 2021-04-29: 5 [IU] via SUBCUTANEOUS
  Administered 2021-04-29 (×2): 3 [IU] via SUBCUTANEOUS
  Administered 2021-04-30 (×2): 5 [IU] via SUBCUTANEOUS
  Administered 2021-04-30: 3 [IU] via SUBCUTANEOUS
  Administered 2021-05-01: 5 [IU] via SUBCUTANEOUS
  Administered 2021-05-01: 3 [IU] via SUBCUTANEOUS
  Administered 2021-05-01: 5 [IU] via SUBCUTANEOUS
  Administered 2021-05-02: 2 [IU] via SUBCUTANEOUS
  Administered 2021-05-02 – 2021-05-03 (×3): 3 [IU] via SUBCUTANEOUS

## 2021-04-28 NOTE — Progress Notes (Signed)
Inpatient Rehab Admissions:  Inpatient Rehab Consult received.  I met with patient and daughter, Vincent Allen at the bedside for rehabilitation assessment and to discuss goals and expectations of an inpatient rehab admission.  Pt asked that I speak with Renee to explain CIR. Renee acknowledged understanding of CIR goals and expectations. She is supportive of pt pursuing CIR. She confirmed that she will be able to provide 24/7 supervision/assistance when pt discharges from hospital. Will continue to follow.  Signed: Gayland Curry, [place], [place] Admissions Coordinator [phone]

## 2021-04-28 NOTE — Evaluation (Signed)
Occupational Therapy Evaluation Patient Details Name: Vincent Allen MRN: [id number] DOB: 11/25/1933 Today's Date: 04/28/2021   History of Present Illness Pt is an 88 y.o. male who presented 04/20/21 with x3 week hx of DOE and 20-25 lb weight gain. Pt admitted with volume overload and acute on chronic combined systolic/diastolic congestive heart failure, chronic stage IIIb kidney disease, and hypokalemia/hypomagnesemia. S/p paracentesis 2/4. PMH: CHF, afib, s/p PPM, HTN, HLD, DM2, CKD stage III   Clinical Impression   Pt is very motivated to participate in session as reported "I will try to do what you want me to do". Pt able to complete bed mobility with max assistance. Pt required increase in time while sitting at EOB  as started to lean onto L elbow but with increase in time was able to complete sitting at midline with UE dressing and bathing with min assistance. Pt required max assist from sit to stand but was able to remain for about 56min in standing and taking a couple side steps but required mod to max assist with walker. Pt currently with functional limitations due to the deficits listed below (see OT Problem List).  Pt will benefit from skilled OT to increase their safety and independence with ADL and functional mobility for ADL to facilitate discharge to venue listed below.        Recommendations for follow up therapy are one component of a multi-disciplinary discharge planning process, led by the attending physician.  Recommendations may be updated based on patient status, additional functional criteria and insurance authorization.   Follow Up Recommendations  Acute inpatient rehab (3hours/day)    Assistance Recommended at Discharge Frequent or constant Supervision/Assistance  Patient can return home with the following A lot of help with walking and/or transfers;A lot of help with bathing/dressing/bathroom    Functional Status Assessment  Patient has had a recent decline in their  functional status and demonstrates the ability to make significant improvements in function in a reasonable and predictable amount of time.  Equipment Recommendations       Recommendations for Other Services       Precautions / Restrictions Precautions Precautions: Fall Precaution Comments: HOH Restrictions Weight Bearing Restrictions: No      Mobility Bed Mobility Overal bed mobility: Needs Assistance Bed Mobility: Sit to Supine       Sit to supine: Max assist, HOB elevated   General bed mobility comments: Pt needed increase time as leaning onto L elbow and then able to complete unsupportive sitting with increase in time    Transfers Overall transfer level: Needs assistance Equipment used: Rolling walker (2 wheels) Transfers: Sit to/from Stand Sit to Stand: Max assist, From elevated surface           General transfer comment: min assist to control walker and complete side stepping to [facility], noted pt reported they felt like they were going posterior but was going anterior when standing      Balance Overall balance assessment: Needs assistance Sitting-balance support: No upper extremity supported, Feet supported Sitting balance-Leahy Scale: Fair     Standing balance support: Bilateral upper extremity supported Standing balance-Leahy Scale: Poor Standing balance comment: Reliant on RW and modA to stand.                           ADL either performed or assessed with clinical judgement   ADL Overall ADL's : Needs assistance/impaired Eating/Feeding: Set up;Sitting   Grooming: Wash/dry hands;Wash/dry face;Set up;Cueing  for safety;Cueing for sequencing;Sitting   Upper Body Bathing: Minimal assistance;Cueing for safety;Cueing for sequencing;Sitting   Lower Body Bathing: Maximal assistance;Sitting/lateral leans   Upper Body Dressing : Minimal assistance;Cueing for safety;Cueing for sequencing;Sitting   Lower Body Dressing: Maximal assistance;Cueing for  safety;Cueing for sequencing;Sit to/from stand   Toilet Transfer: Cueing for safety;Cueing for sequencing;Moderate assistance;Rolling walker (2 wheels)   Toileting- Clothing Manipulation and Hygiene: Total assistance;Cueing for safety;Cueing for sequencing;Sit to/from stand               Vision         Perception     Praxis      Pertinent Vitals/Pain Pain Assessment Pain Assessment: Faces Faces Pain Scale: Hurts a little bit Pain Location: R knee with flexion Pain Descriptors / Indicators: Grimacing, Guarding, Discomfort Pain Intervention(s): Limited activity within patient's tolerance, Monitored during session, Repositioned     Hand Dominance Right   Extremity/Trunk Assessment Upper Extremity Assessment Upper Extremity Assessment: RUE deficits/detail;LUE deficits/detail RUE Deficits / Details: Pt reported chronic issues with BUE  and can be painful with AROM and PROM. Pt limited to about 60 degrees of forward flexion in RUE and about 20 degrees in LUE LUE Deficits / Details: Pt reported chronic issues with BUE  and can be painful with AROM and PROM. Pt limited to about 60 degrees of forward flexion in RUE and about 20 degrees in LUE   Lower Extremity Assessment Lower Extremity Assessment: Defer to PT evaluation RLE Deficits / Details: Edema noted; denies numbness/tingling throughout; limitations in ankle DF/PF ROM due to hx of surgery and pins being placed; WFL knee ROM LLE Deficits / Details: Edema noted; denies numbness/tingling throughout; [facility] ankle ROM; limited knee flexion ROM with pt reporting chronic issues with knee   Cervical / Trunk Assessment Cervical / Trunk Assessment: Kyphotic   Communication Communication Communication: HOH   Cognition Arousal/Alertness: Awake/alert Behavior During Therapy: WFL for tasks assessed/performed Overall Cognitive Status: Within Functional Limits for tasks assessed                                 General  Comments: Follows all commands appropriately.     General Comments       Exercises     Shoulder Instructions      Home Living Family/patient expects to be discharged to:: Private residence Living Arrangements: Alone Available Help at Discharge: Family;Available PRN/intermittently Type of Home: House Home Access: Level entry     Home Layout: One level     Bathroom Shower/Tub: Tub/shower unit;Sponge bathes at baseline   Bathroom Toilet: Handicapped height     Home Equipment: Cane - single point;Rolling Walker (2 wheels);Grab bars - toilet;Grab bars - tub/shower;Other (comment)   Additional Comments: Pt sleeps in lift chair "90%" of the time, and it assists him to stand due to chronic R knee flexion ROM limitations. Daughter "does everything: and son "does nothing". Son is "supposed to be helping me" and be by almost daily, daughter comes to see him 2-3x/week. Pt reporteing they ahve just been doing what they can at [facility]      Prior Functioning/Environment Prior Level of Function : Needs assist;History of Falls (last six months)       Physical Assist : ADLs (physical)     Mobility Comments: Pt mod I using SPC for mobility. x1 fall in which he slipped on water. ADLs Comments: Pt's daughter does the grocer shopping and helps  clean the house PRN. Pt's son provides transportation.        OT Problem List: Decreased strength;Decreased range of motion;Decreased activity tolerance;Impaired balance (sitting and/or standing);Decreased safety awareness;Cardiopulmonary status limiting activity;Pain      OT Treatment/Interventions: Self-care/ADL training;Therapeutic exercise;DME and/or AE instruction;Therapeutic activities;Patient/family education;Balance training    OT Goals(Current goals can be found in the care plan section) Acute Rehab OT Goals Patient Stated Goal: to get stronger OT Goal Formulation: With patient Time For Goal Achievement: 05/12/21 Potential to Achieve Goals:  Good ADL Goals Pt Will Perform Upper Body Bathing: with modified independence;sitting Pt Will Perform Lower Body Bathing: with modified independence;sit to/from stand Pt Will Transfer to Toilet: with supervision;ambulating;regular height toilet;grab bars Pt Will Perform Tub/Shower Transfer: with supervision;ambulating;shower seat;grab bars  OT Frequency: Min 2X/week    Co-evaluation              AM-PAC OT "6 Clicks" Daily Activity     Outcome Measure Help from another person eating meals?: None Help from another person taking care of personal grooming?: A Little Help from another person toileting, which includes using toliet, bedpan, or urinal?: Total Help from another person bathing (including washing, rinsing, drying)?: A Lot Help from another person to put on and taking off regular upper body clothing?: A Little Help from another person to put on and taking off regular lower body clothing?: A Lot 6 Click Score: 15   End of Session Equipment Utilized During Treatment: Gait belt;Rolling walker (2 wheels) Nurse Communication: Mobility status  Activity Tolerance: Patient tolerated treatment well Patient left: in bed;with call bell/phone within reach;with bed alarm set  OT Visit Diagnosis: Unsteadiness on feet (R26.81);Other abnormalities of gait and mobility (R26.89);Repeated falls (R29.6);Pain Pain - Right/Left:  (Le/BUE)                Time: 7782-4235 OT Time Calculation (min): 36 min Charges:  OT General Charges $OT Visit: 1 Visit OT Evaluation $OT Eval Low Complexity: 1 Low OT Treatments $Self Care/Home Management : 8-22 mins  Joeseph Amor OTR/L  Acute Rehab Services  [phone] office number [phone] pager number   Joeseph Amor 04/28/2021, 12:58 PM

## 2021-04-28 NOTE — PMR Pre-admission (Shared)
PMR Admission Coordinator Pre-Admission Assessment  Patient: Vincent Allen is an 88 y.o., male MRN: [id number] DOB: March 27, 1933 Height: 6' (182.9 cm) Weight: 96.8 kg  Insurance Information HMO:     PPO: yes     PCP:      IPA:      80/20:      OTHER:  PRIMARY: [employer]     Policy#: [id number]      Subscriber: patient CM Name: ***      Phone#: ***     Fax#: [phone] Pre-Cert#: ***      Employer: *** Benefits:  Phone #: [phone]     Name:  Eff. Date: 03/18/21     Deduct: no deductible Out of Pocket Max: $3,000 ($0 met)      Life Max: NA CIR: $200/day co-pay for days 1-5, 100% coverage for days 6-90      SNF: 100% coverage days 1-20, $184/day co-pay for days 21-100 Outpatient: $15/visit co-pay     Co-Pay:  Home Health: 100% coverage      Co-Pay:  DME: 80% coverage     Co-Pay: 20% co-insurance Providers: in-network SECONDARY:       Policy#:      Phone#:   Development worker, community:       Phone#:   The Actuary for patients in Inpatient Rehabilitation Facilities with attached Privacy Act [place] Records was provided and verbally reviewed with: {CHL IP Patient Family AG:[id number]}  Emergency Contact Information Contact Information     Name Relation Home Work [place] Daughter [phone]    [place], [place] [phone]         Current Medical History  Patient Admitting Diagnosis: debility d/t acute on chronic combined systolic and diastolic CHF History of Present Illness: Pt is a 88 year old male with medical hx significant for: CHF, CKD stage 3, DM II, HTN, GERD, HLD, persistent A-fib, bradycardia s/p pacemaker implantation. Pt presented to hospital on 04/20/21 with c/o SOB, 20-25lb weight gain, lower extremity and abdominal swelling over past 3 weeks.  EKG showed pt was ventricular paced rhythm. Chest x-ray showed cardiomegaly but no evidence of volume overload. PICC line placed on 04/21/21; initial  Co-ox was 76% Ultrasound showed large volume ascites. Paracentesis performed on 04/21/21 yielded 5L of yellow fluid. Echo on 04/22/21 showed EF 40-45%. Pt placed on aggressive diuresis with IV furosemide and inotropic support with IV milrinone. Therapy evaluations completed and CIR recommended d/t pt's deficits in functional mobility and ability to complete ADLs independently.     Patient's medical record from [facility] has been reviewed by the rehabilitation admission coordinator and physician.  Past Medical History  Past Medical History:  Diagnosis Date   Arthritis    Bradycardia    Cataract    Chronic kidney disease stage III (GFR 30-59 ml/min) 05/03/2012   Colon polyps    Complication of anesthesia    emesis   Diabetes mellitus    GERD (gastroesophageal reflux disease)    HOH (hard of hearing)    HTN (hypertension) 01/13/2013   Hyperlipidemia    Hypertension    Pacemaker 12/2016   Persistent atrial fibrillation (HCC)    Presence of permanent cardiac pacemaker 09/20/2016   Symptomatic bradycardia    Vitamin D deficiency 07/17/2015    Has the patient had major surgery during 100 days prior to admission? No  Family History   family history includes Cancer in his mother and paternal grandmother; Diabetes in his  father; Heart disease in his father and son; Obesity in his son; Stroke in his father.  Current Medications  Current Facility-Administered Medications:    acetaminophen (TYLENOL) tablet 650 mg, 650 mg, Oral, Q6H PRN, 650 mg at 04/25/21 1526 **OR** acetaminophen (TYLENOL) suppository 650 mg, 650 mg, Rectal, Q6H PRN, Orma Flaming, MD   apixaban Arne [place]) tablet 2.5 mg, 2.5 mg, Oral, BID, Orma Flaming, MD, 2.5 mg at 04/30/21 0844   bisacodyl (DULCOLAX) EC tablet 5 mg, 5 mg, Oral, QHS, Arrien, Jimmy Picket, MD   Chlorhexidine Gluconate Cloth 2 % PADS 6 each, 6 each, Topical, Daily, Domenic Polite, MD, 6 each at 04/30/21 0847   empagliflozin (JARDIANCE) tablet 10  mg, 10 mg, Oral, Daily, Arrien, Jimmy Picket, MD, 10 mg at 04/30/21 0844   feeding supplement (ENSURE ENLIVE / ENSURE PLUS) liquid 237 mL, 237 mL, Oral, BID BM, Arrien, Jimmy Picket, MD, 237 mL at 04/30/21 0847   furosemide (LASIX) tablet 80 mg, 80 mg, Oral, Daily, Croitoru, Mihai, MD   guaiFENesin (MUCINEX) 12 hr tablet 600 mg, 600 mg, Oral, BID, Arrien, Jimmy Picket, MD, 600 mg at 04/30/21 0844   insulin aspart (novoLOG) injection 0-15 Units, 0-15 Units, Subcutaneous, TID WC, Arrien, Jimmy Picket, MD, 5 Units at 04/30/21 1158   insulin glargine-yfgn (SEMGLEE) injection 5 Units, 5 Units, Subcutaneous, Daily, Arrien, Jimmy Picket, MD   multivitamin with minerals tablet 1 tablet, 1 tablet, Oral, Daily, Arrien, Jimmy Picket, MD, 1 tablet at 04/30/21 0844   potassium chloride SA (KLOR-CON M) CR tablet 40 mEq, 40 mEq, Oral, BID, Lelon Perla, MD, 40 mEq at 04/30/21 0844   sodium chloride flush (NS) 0.9 % injection 10-40 mL, 10-40 mL, Intracatheter, Q12H, Hilty, Nadean Corwin, MD, 10 mL at 04/29/21 6010  Patients Current Diet:  Diet Order             Diet Carb Modified Fluid consistency: Thin; Room service appropriate? Yes; Fluid restriction: 1200 mL Fluid  Diet effective now                   Precautions / Restrictions Precautions Precautions: Fall Precaution Comments: HOH Restrictions Weight Bearing Restrictions: No   Has the patient had 2 or more falls or a fall with injury in the past year? No  Prior Activity Level Limited Community (1-2x/wk): leaves house for  MD appointments  Prior Functional Level Self Care: Did the patient need help bathing, dressing, using the toilet or eating? Independent  Indoor Mobility: Did the patient need assistance with walking from room to room (with or without device)? Independent  Stairs: Did the patient need assistance with internal or external stairs (with or without device)? Needed some help  Functional Cognition: Did the  patient need help planning regular tasks such as shopping or remembering to take medications? Needed some help  Patient Information    Patient's Response To:     Home Assistive Devices / Equipment Home Assistive Devices/Equipment: Cane (specify quad or straight), Dentures (specify type) Home Equipment: Cane - single point, Rolling Walker (2 wheels), Grab bars - toilet, Grab bars - tub/shower, Other (comment)  Prior Device Use: Indicate devices/aids used by the patient prior to current illness, exacerbation or injury?  cane  Current Functional Level Cognition  Overall Cognitive Status: Within Functional Limits for tasks assessed Orientation Level: Oriented X4 General Comments: Follows all commands appropriately.    Extremity Assessment (includes Sensation/Coordination)  Upper Extremity Assessment: RUE deficits/detail, LUE deficits/detail RUE Deficits / Details: Pt reported chronic  issues with BUE  and can be painful with AROM and PROM. Pt limited to about 60 degrees of forward flexion in RUE and about 20 degrees in LUE LUE Deficits / Details: Pt reported chronic issues with BUE  and can be painful with AROM and PROM. Pt limited to about 60 degrees of forward flexion in RUE and about 20 degrees in LUE  Lower Extremity Assessment: Defer to PT evaluation RLE Deficits / Details: Edema noted; denies numbness/tingling throughout; limitations in ankle DF/PF ROM due to hx of surgery and pins being placed; [facility] knee ROM LLE Deficits / Details: Edema noted; denies numbness/tingling throughout; [facility] ankle ROM; limited knee flexion ROM with pt reporting chronic issues with knee    ADLs  Overall ADL's : Needs assistance/impaired Eating/Feeding: Set up, Sitting Grooming: Wash/dry hands, Wash/dry face, Set up, Sitting Upper Body Bathing: Minimal assistance, Cueing for safety, Cueing for sequencing, Sitting Lower Body Bathing: Maximal assistance, Sitting/lateral leans Upper Body Dressing : Minimal  assistance, Cueing for safety, Cueing for sequencing, Sitting Lower Body Dressing: Maximal assistance, Cueing for safety, Cueing for sequencing, Sit to/from stand Toilet Transfer: Cueing for safety, Cueing for sequencing, Rolling walker (2 wheels), Maximal assistance Toilet Transfer Details (indicate cue type and reason): max A to come into standing this session Toileting- Clothing Manipulation and Hygiene: Total assistance, Cueing for safety, Cueing for sequencing, Sit to/from stand Toileting - Clothing Manipulation Details (indicate cue type and reason): unable to balance in standing without BUE on RW Functional mobility during ADLs: Maximal assistance, Cueing for safety, Cueing for sequencing, Rolling walker (2 wheels) General ADL Comments: Patient continues to demonstrate poor activity tolerance and endurance    Mobility  Overal bed mobility: Needs Assistance Bed Mobility: Supine to Sit, Sit to Supine Supine to sit: Min assist Sit to supine: Mod assist General bed mobility comments: pt able to move LE OOB and minA to pull on therapist to achieve sitting EOB, then modA to bring BLE into bed    Transfers  Overall transfer level: Needs assistance Equipment used: 1 person hand held assist Transfers: Sit to/from Stand Sit to Stand: Min assist Bed to/from chair/wheelchair/BSC transfer type:: Step pivot Step pivot transfers: Mod assist General transfer comment: pt declined use of RW this session and was able to demo good strength with BUE to initiate hip lift from EOB. With increased time and effort, pt able to slowly rise with minA to steady in standing. completed x6    Ambulation / Gait / Stairs / Wheelchair Mobility  Ambulation/Gait Ambulation/Gait assistance: Mod assist Gait Distance (Feet): 3 Feet Assistive device: 1 person hand held assist Gait Pattern/deviations: Step-to pattern, Decreased stride length, Decreased dorsiflexion - right, Decreased dorsiflexion - left General Gait  Details: pt with small steps with minimal clearance and modA to steady with movements Gait velocity: reduced Gait velocity interpretation: <1.31 ft/sec, indicative of household ambulator    Posture / Balance Dynamic Sitting Balance Sitting balance - Comments: Static sitting EOB with supervision Balance Overall balance assessment: Needs assistance Sitting-balance support: No upper extremity supported, Feet supported Sitting balance-Leahy Scale: Fair Sitting balance - Comments: Static sitting EOB with supervision Standing balance support: Bilateral upper extremity supported Standing balance-Leahy Scale: Poor Standing balance comment: static standing wtih minA and single UE support    Special needs/care consideration Skin Cellulitis: leg/bilateral; Ecchymosis: arm/bilateral, Bladder incontinence, External urinary catheter and Diabetic management semglee 5 units daily; novoLOG 0-15 units 3x daily with meals   Previous Home Environment (from acute therapy documentation) Living  Arrangements: Alone Available Help at Discharge: Family, Available 24 hours/day Type of Home: House Home Layout: One level Home Access: Level entry Bathroom Shower/Tub: Chiropodist: Handicapped height Bathroom Accessibility: Yes How Accessible: Accessible via walker [place]: Other (Comment) (participates in [facility] program which allows nurse to come to house a few times a month) Additional Comments: Pt sleeps in lift chair "90%" of the time, and it assists him to stand due to chronic R knee flexion ROM limitations. Daughter "does everything: and son "does nothing". Son is "supposed to be helping me" and be by almost daily, daughter comes to see him 2-3x/week. Pt reporteing they ahve just been doing what they can at [facility]  Discharge Living Setting Plans for Discharge Living Setting: Patient's home Type of Home at Discharge: House Discharge Home Layout: One level Discharge Home Access:  Level entry Discharge Bathroom Shower/Tub: Tub/shower unit Discharge Bathroom Toilet: Handicapped height Discharge Bathroom Accessibility: Yes How Accessible: Accessible via walker Does the patient have any problems obtaining your medications?: No  Social/Family/Support Systems Anticipated Caregiver: Henry Russel, daughter Anticipated Caregiver's Contact Information: [phone] Caregiver Availability: 24/7 Discharge Plan Discussed with Primary Caregiver: Yes Is Caregiver In Agreement with Plan?: Yes Does Caregiver/Family have Issues with Lodging/Transportation while Pt is in Rehab?: No  Goals Patient/Family Goal for Rehab: Supervision: PT/OT Expected length of stay: 12-14 days Pt/Family Agrees to Admission and willing to participate: Yes Program Orientation Provided & Reviewed with Pt/Caregiver Including Roles  & Responsibilities: Yes  Decrease burden of Care through IP rehab admission: NA  Possible need for SNF placement upon discharge: Not anticipated  Patient Condition: I have reviewed medical records from [facility], spoken with CSW, and patient and daughter. I met with patient at the bedside for inpatient rehabilitation assessment.  Patient will benefit from ongoing PT and OT, can actively participate in 3 hours of therapy a day 5 days of the week, and can make measurable gains during the admission.  Patient will also benefit from the coordinated team approach during an Inpatient Acute Rehabilitation admission.  The patient will receive intensive therapy as well as Rehabilitation physician, nursing, social worker, and care management interventions.  Due to bladder management, safety, skin/wound care, disease management, medication administration, and patient education the patient requires 24 hour a day rehabilitation nursing.  The patient is currently *** with mobility and basic ADLs.  Discharge setting and therapy post discharge at home with home health is anticipated.   Patient has agreed to participate in the Acute Inpatient Rehabilitation Program and will admit {Time; today/tomorrow:10263}.  Preadmission Screen Completed By:  Bethel Born, 04/30/2021 2:47 PM ______________________________________________________________________   Discussed status with Dr. Marland Kitchen on *** at *** and received approval for admission today.  Admission Coordinator:  Bethel Born, CCC-SLP, time ***/Date ***   Assessment/Plan: Diagnosis: Does the need for close, 24 hr/day Medical supervision in concert with the patient's rehab needs make it unreasonable for this patient to be served in a less intensive setting? {yes_no_potentially:3041433} Co-Morbidities requiring supervision/potential complications: *** Due to {due TG:2563893}, does the patient require 24 hr/day rehab nursing? {yes_no_potentially:3041433} Does the patient require coordinated care of a physician, rehab nurse, PT, OT, and SLP to address physical and functional deficits in the context of the above medical diagnosis(es)? {yes_no_potentially:3041433} Addressing deficits in the following areas: {deficits:3041436} Can the patient actively participate in an intensive therapy program of at least 3 hrs of therapy 5 days a week? {yes_no_potentially:3041433} The potential for patient to  make measurable gains while on inpatient rehab is {potential:3041437} Anticipated functional outcomes upon discharge from inpatient rehab: {functional outcomes:304600100} PT, {functional outcomes:304600100} OT, {functional outcomes:304600100} SLP Estimated rehab length of stay to reach the above functional goals is: *** Anticipated discharge destination: {anticipated dc setting:21604} 10. Overall Rehab/Functional Prognosis: {potential:3041437}   MD Signature: ***

## 2021-04-28 NOTE — Progress Notes (Signed)
Progress Note  Patient Name: Vincent Allen Date of Encounter: 04/28/2021  Primary Cardiologist:   Pixie Casino, MD   Subjective   Very hard of hearing.  No acute complaints.  No SOB.  Very weak.    Inpatient Medications    Scheduled Meds:  apixaban  2.5 mg Oral BID   Chlorhexidine Gluconate Cloth  6 each Topical Daily   feeding supplement  237 mL Oral BID BM   guaiFENesin  600 mg Oral BID   multivitamin with minerals  1 tablet Oral Daily   potassium chloride  40 mEq Oral BID   sodium chloride flush  10-40 mL Intracatheter Q12H   Continuous Infusions:  furosemide 120 mg (04/27/21 1757)   milrinone 0.25 mcg/kg/min (04/28/21 0142)   PRN Meds: acetaminophen **OR** acetaminophen   Vital Signs    Vitals:   04/27/21 1056 04/27/21 1600 04/27/21 2010 04/28/21 0400  BP: (!) 141/47 124/76 (!) 129/49 (!) 123/44  Pulse: 60   (!) 59  Resp: 20  17 14   Temp: 98.4 F (36.9 C)  97.8 F (36.6 C) 98 F (36.7 C)  TempSrc: Oral  Oral Oral  SpO2: 97%  97% 97%  Weight:    102.2 kg  Height:        Intake/Output Summary (Last 24 hours) at 04/28/2021 0809 Last data filed at 04/28/2021 0500 Gross per 24 hour  Intake 785.37 ml  Output 3101 ml  Net -2315.63 ml   Filed Weights   04/26/21 0642 04/27/21 0453 04/28/21 0400  Weight: 107.8 kg 104.5 kg 102.2 kg    Telemetry    Ventricular demand pacing - Personally Reviewed  ECG    NA - Personally Reviewed  Physical Exam   GEN: No acute distress.   Neck: No  JVD Cardiac: RRR, no murmurs, rubs, or gallops.  Respiratory: Clear  to auscultation bilaterally. GI: Soft, nontender, non-distended  MS:    Severe leg edema; No deformity. Neuro:  Nonfocal  Psych: Normal affect   Labs    Chemistry Recent Labs  Lab 04/26/21 0453 04/27/21 0500 04/28/21 0400  NA 133* 132* 132*  K 3.3* 3.0* 3.1*  CL 99 95* 96*  CO2 25 27 28   GLUCOSE 185* 232* 235*  BUN 73* 69* 68*  CREATININE 2.26* 2.05* 2.03*  CALCIUM 8.4* 8.5* 8.4*   GFRNONAA 28* 31* 32*  ANIONGAP 9 10 8      Hematology Recent Labs  Lab 04/22/21 0532 04/25/21 0237  WBC 8.7 6.8  RBC 3.80* 3.32*  HGB 10.7* 9.1*  HCT 31.8* 28.1*  MCV 83.7 84.6  MCH 28.2 27.4  MCHC 33.6 32.4  RDW 14.6 14.5  PLT 177 178    Cardiac EnzymesNo results for input(s): TROPONINI in the last 168 hours. No results for input(s): TROPIPOC in the last 168 hours.   BNPNo results for input(s): BNP, PROBNP in the last 168 hours.   DDimer No results for input(s): DDIMER in the last 168 hours.   Radiology    No results found.  Cardiac Studies   ECHO:  1. Windows are limited. Challenging to fully assess wall motion. LVEF  approximately 40-45%. Left ventricular ejection fraction, by estimation,  is 40 to 45%. The left ventricle has mildly decreased function. Left  ventricular diastolic parameters were  normal.   2. Right ventricular systolic function is mildly reduced. The right  ventricular size is normal. There is moderately elevated pulmonary artery  systolic pressure.   3. The mitral valve is  grossly normal. No evidence of mitral valve  regurgitation.   4. Tricuspid valve regurgitation is mild to moderate.   5. Aortic valve regurgitation is not visualized. Aortic valve  sclerosis/calcification is present, without any evidence of aortic  stenosis.   6. The inferior vena cava IVC not well visualized.   Comparison(s): No significant change from prior study.   Patient Profile     88 y.o. male combined CHF with EF of 40-45%, persistent atrial fibrillation, s/p PPM, hypertension, hyperlipidemia, type 2 diabetes mellitus, and CKD stage III who was admitted on 04/20/2021 with acute CHF after presenting with progressive shortness of breath and marked volume overload.  Echocardiogram this admission shows ejection fraction 40 to 45%, mild RV dysfunction, moderate pulmonary hypertension, mild to moderate tricuspid regurgitation.  Assessment & Plan    Acute on chronic  systolic and diastolic HF: He continues on furosemide and metolazone.  Creatinine as below.  Remains on milrinone.  Net -8.1 L.  Weight continues to fall down 21 pounds since admission.  Needs continued diuresis with the current dose.  Seems to be tolerating this.   CKD IIIB: Creatinine 2.03 and down from a peak.  Follow  Atrial fib:  On DOAC.  Rate OK.  Paced  Pacemaker: Question failure to sense.  Interrogation requested.   I don't see this in the shadow chart.  We will look for this.   For questions or updates, please contact [place] Please consult [email or web address] for contact info under Cardiology/STEMI.   Signed, Minus Breeding, MD  04/28/2021, 8:09 AM

## 2021-04-28 NOTE — Progress Notes (Addendum)
Progress Note   Vincent Allen: Vincent Allen ZDG:[id number] DOB: 15-Sep-1933 DOA: 04/20/2021     7 DOS: the Vincent Allen was seen and examined on 04/28/2021   Brief hospital course: Vincent Allen was admitted to the hospital with the working diagnosis of decompensated heart failure.   88 yo male with the past medical history of CKD stage 3, T2DM, HTN, GERD, persistent atrial fibrillation and bradycardia sp pacemaker implantation who presented with dyspnea and weight gain. Reported 3 weeks of worsening dyspnea, along with 20 to 25 lbs weight gain, his nephrologist advised him to come to the ED. On his initial physical examination his blood pressure was 128/81, HR 65, RR 16, and oxygen saturation 100% on room air. Heart with S1 and S2 rhythmic, lungs with no rales or rhonchi, abdomen soft but distended, positive lower extremity edema +++.   Na 135, K 3,2 Cl 99, bicarb 21, glucose 168, bun 75 and cr 2,0  BNP 1,460 High sensitive troponin 83 and 93  Wbc 10,2 hgb 11,9, hct 37 and plt 233  SARS COVID 19 negative   Urine analysis with SG 1,020, negative leukocytes.   Chest radiograph with cardiomegaly with hilar vascular congestion and small left pleural effusion.   EKG 61 bpm, right axis deviation, QTc 541, ventricular paced rhythm with positive PVC, no significant ST segment or T wave changes.   Vincent Allen placed on aggressive diuresis with IV furosemide and started on inotropic support with IV milrinone.   02/04 paracentesis with 5 L removed with good toleration   Vincent Allen slowly improving volume status but continue with significant hypervolemia, despite aggressive diuretic therapy and inotropic support.    Assessment and Plan: * Acute on chronic combined systolic and diastolic CHF (congestive heart failure) ([place])- (present on admission) SP paracentesis for ascites related heart failure. Echocardiogram with LV EF 40 to 95%, RV systolic function with mild reduction, moderate elevation in systolic  pulmonary pressure. Mild to moderate tricuspid valve regurgitation.   Slowly improving volume status, but not yet euvolemic.   Urine output over last 24 hrs is 1,884 ml His systolic blood pressure has been 120 to 130 mmHg.   On diuresis with furosemide 120 mg bid  Inotropic support with milrinone.  To consider adding acetazolamide Add empagliflozin.    Continue to encourage mobility. Out of bed to chair as tolerated.  Consult PT and OT to improve mobility.   Acute renal failure superimposed on stage 3a chronic kidney disease ([place])- (present on admission) Hypokalemia hyponatremia Renal function with serum cr at 2,0 with K at 3,1 and serum bicarbonate at 28.  Na is 132.   Plan to continue with high dose furosemide.  To consider adding acetazolamide to improve diuresis.   Essential hypertension- (present on admission) Blood pressure has been stable, continue with milrinone and diuretic therapy.    Persistent atrial fibrillation ([place])- (present on admission) On apixaban for anticoagulation.  Rate is controlled in to 60 bpm range.   Diabetes mellitus with chronic kidney disease ([place])- (present on admission) Uncontrolled hyperglycemia, with fasting glucose this am at 235. Will resume insulin sliding scale for now Continue close monitoring of capillary glucose.  Add empagliflozin.   Cardiac device in situ V-pacing, follow cards recs   Malnutrition of moderate degree- (present on admission) Continue with nutritional supplements.   Class 1 obesity Calculated BMI is 32,2   Elevated troponin-resolved as of 04/25/2021, (present on admission) Mildly elevated with flat trend, do not suspect ACS -Likely demand ischemia in the setting  of CHF        Subjective: Vincent Allen with slow improvement in edema, continue to be very weak and deconditioned. Tolerating po well.   Physical Exam: Vitals:   04/27/21 1600 04/27/21 2010 04/28/21 0400 04/28/21 1143  BP: 124/76 (!) 129/49 (!)  123/44 133/65  Pulse:   (!) 59 63  Resp:  17 14 20   Temp:  97.8 F (36.6 C) 98 F (36.7 C) 98.4 F (36.9 C)  TempSrc:  Oral Oral Oral  SpO2:  97% 97% 97%  Weight:   102.2 kg   Height:       Neurology awake and alert ENT with mild pallor Cardiovascular S1 and S2 present with no gallops or murmurs No JVD Positive lower extremities at the thighs ++ pitting bilaterally, positive unna boots bilaterally Respiratory anterior auscultation with no wheezing or rales, no rhonchi Abdomen soft and not distended, protuberant.   Data Reviewed:    Family Communication: no family at the beside   Disposition: Status is: Inpatient Remains inpatient appropriate because: heart failure management.      Planned Discharge Destination: Rehab      Author: Tawni Millers, MD 04/28/2021 1:45 PM  For on call review [email or web address].

## 2021-04-28 NOTE — Assessment & Plan Note (Addendum)
On nutritional supplements.

## 2021-04-29 DIAGNOSIS — E440 Moderate protein-calorie malnutrition: Secondary | ICD-10-CM

## 2021-04-29 LAB — BASIC METABOLIC PANEL
Anion gap: 10 (ref 5–15)
Anion gap: 11 (ref 5–15)
BUN: 63 mg/dL — ABNORMAL HIGH (ref 8–23)
BUN: 65 mg/dL — ABNORMAL HIGH (ref 8–23)
CO2: 30 mmol/L (ref 22–32)
CO2: 32 mmol/L (ref 22–32)
Calcium: 8.7 mg/dL — ABNORMAL LOW (ref 8.9–10.3)
Calcium: 8.7 mg/dL — ABNORMAL LOW (ref 8.9–10.3)
Chloride: 95 mmol/L — ABNORMAL LOW (ref 98–111)
Chloride: 92 mmol/L — ABNORMAL LOW (ref 98–111)
Creatinine, Ser: 1.91 mg/dL — ABNORMAL HIGH (ref 0.61–1.24)
Creatinine, Ser: 1.92 mg/dL — ABNORMAL HIGH (ref 0.61–1.24)
GFR, Estimated: 34 mL/min — ABNORMAL LOW (ref >60)
GFR, Estimated: 34 mL/min — ABNORMAL LOW (ref >60)
Glucose, Bld: 195 mg/dL — ABNORMAL HIGH (ref 70–99)
Glucose, Bld: 207 mg/dL — ABNORMAL HIGH (ref 70–99)
Potassium: 3.3 mmol/L — ABNORMAL LOW (ref 3.5–5.1)
Potassium: 3.1 mmol/L — ABNORMAL LOW (ref 3.5–5.1)
Sodium: 135 mmol/L (ref 135–145)
Sodium: 135 mmol/L (ref 135–145)

## 2021-04-29 LAB — COOXEMETRY PANEL
Carboxyhemoglobin: 1.6 % — ABNORMAL HIGH (ref 0.5–1.5)
Methemoglobin: 1.1 % (ref 0.0–1.5)
O2 Saturation: 70.4 %
Total hemoglobin: 10.3 g/dL — ABNORMAL LOW (ref 12.0–16.0)

## 2021-04-29 LAB — GLUCOSE, CAPILLARY
Glucose-Capillary: 170 mg/dL — ABNORMAL HIGH (ref 70–99)
Glucose-Capillary: 241 mg/dL — ABNORMAL HIGH (ref 70–99)
Glucose-Capillary: 181 mg/dL — ABNORMAL HIGH (ref 70–99)
Glucose-Capillary: 192 mg/dL — ABNORMAL HIGH (ref 70–99)

## 2021-04-29 LAB — MAGNESIUM: Magnesium: 1.7 mg/dL (ref 1.7–2.4)

## 2021-04-29 MED ORDER — POTASSIUM CHLORIDE ER 10 MEQ PO TBCR
20.0000 meq | EXTENDED_RELEASE_TABLET | Freq: Once | ORAL | Status: AC
  Administered 2021-04-29: 20 meq via ORAL
  Filled 2021-04-29 (×2): qty 2

## 2021-04-29 MED ORDER — MAGNESIUM SULFATE 2 GM/50ML IV SOLN
2.0000 g | Freq: Once | INTRAVENOUS | Status: AC
  Administered 2021-04-29: 2 g via INTRAVENOUS
  Filled 2021-04-29: qty 50

## 2021-04-29 MED ORDER — POTASSIUM CHLORIDE 10 MEQ/50ML IV SOLN
10.0000 meq | INTRAVENOUS | Status: AC
  Administered 2021-04-29 – 2021-04-30 (×4): 10 meq via INTRAVENOUS
  Filled 2021-04-29 (×4): qty 50

## 2021-04-29 NOTE — Progress Notes (Signed)
° °  RN notified me of 11 beats of NSVT, pt asymptomatic. EF 40-45%, on lasix and metolazone and IV milrinone. He is neg 12 L since admit.     Will check BMP and Mg+ now   his k+ this AM was 3.3 and he is on Kdur 40 meq BID.   Will ask RN to call results of labs to MD on call.   Cecilie Kicks, FNP-C [facility] [place]  EEF:[phone] or after 5pm and on weekends call [phone] 04/29/2021.

## 2021-04-29 NOTE — Progress Notes (Signed)
Progress Note   Patient: Vincent Allen VHQ:[id number] DOB: 1933-07-16 DOA: 04/20/2021     8 DOS: the patient was seen and examined on 04/29/2021   Brief hospital course: Vincent Allen was admitted to the hospital with the working diagnosis of decompensated heart failure.   88 yo male with the past medical history of CKD stage 3, T2DM, HTN, GERD, persistent atrial fibrillation and bradycardia sp pacemaker implantation who presented with dyspnea and weight gain. Reported 3 weeks of worsening dyspnea, along with 20 to 25 lbs weight gain, his nephrologist advised him to come to the ED. On his initial physical examination his blood pressure was 128/81, HR 65, RR 16, and oxygen saturation 100% on room air. Heart with S1 and S2 rhythmic, lungs with no rales or rhonchi, abdomen soft but distended, positive lower extremity edema +++.   Na 135, K 3,2 Cl 99, bicarb 21, glucose 168, bun 75 and cr 2,0  BNP 1,460 High sensitive troponin 83 and 93  Wbc 10,2 hgb 11,9, hct 37 and plt 233  SARS COVID 19 negative   Urine analysis with SG 1,020, negative leukocytes.   Chest radiograph with cardiomegaly with hilar vascular congestion and small left pleural effusion.   EKG 61 bpm, right axis deviation, QTc 541, ventricular paced rhythm with positive PVC, no significant ST segment or T wave changes.   Patient placed on aggressive diuresis with IV furosemide and started on inotropic support with IV milrinone.   02/04 paracentesis with 5 L removed with good toleration   Patient slowly improving volume status but continue with significant hypervolemia, despite aggressive diuretic therapy and inotropic support.   02/12 patient with improvement in volume status with decreased edema and dyspnea.  Continue to be very weak and deconditioned, possible transfer to CIR   Assessment and Plan: * Acute on chronic combined systolic and diastolic CHF (congestive heart failure) ([place])- (present on admission) SP  paracentesis for ascites related heart failure. Echocardiogram with LV EF 40 to 41%, RV systolic function with mild reduction, moderate elevation in systolic pulmonary pressure. Mild to moderate tricuspid valve regurgitation.    Urine output over last 24 hrs is 3244 ml His systolic blood pressure continue to be around 130 mmHg.   Responding well to  diuresis with furosemide 120 mg bid  Inotropic support with milrinone. Empagliflozin. Intermittent dosing or metolazone.     Patient very deconditioned, possible transfer to CIR when heart failure more compensated.   Acute renal failure superimposed on stage 3a chronic kidney disease ([place])- (present on admission) Hypokalemia hyponatremia Patient is diuresing well with improvement in his edema.  Renal function today with a serum cr at 1,91 with K at 3,3 and serum bicarbonate at 30.   Continue with furosemide and intermittent dosing of metolazone.  Follow up renal function and electrolytes in am.   Essential hypertension- (present on admission) Patient on milrinone for inotropic support Continue close monitoring of blood pressure.   Persistent atrial fibrillation ([place])- (present on admission) Patient continued rate control and anticoagulation with apixaban.   Diabetes mellitus with chronic kidney disease ([place])- (present on admission) Uncontrolled hyperglycemia Fasting glucose today is 195, capillary 170 and 241 Plan to continue with insulin sliding scale for glucose cover and monitoring.  Continue with empagliflozin.   Cardiac device in situ V-pacing, follow cards recs   Malnutrition of moderate degree- (present on admission) Continue with nutritional supplements.   Class 1 obesity Calculated BMI is 32,2   Elevated troponin-resolved as of 04/25/2021, (  present on admission) Mildly elevated with flat trend, do not suspect ACS -Likely demand ischemia in the setting of CHF        Subjective: patient is feeling better,  decreased edema of lower extremities, continue to be very weak and deconditioned   Physical Exam: Vitals:   04/29/21 0401 04/29/21 0402 04/29/21 0600 04/29/21 1129  BP:    (!) 136/51  Pulse: (!) 57 (!) 59  60  Resp: 17 19  18   Temp:    97.9 F (36.6 C)  TempSrc:    Oral  SpO2:    97%  Weight:   99.3 kg   Height:       Neurology awake and alert, deconditioned ENT positive pallor  Cardiovascular with S1 and S2 present and rhythmic, with no gallops or murmurs,  No JVD Lower extremity with trace edema, decreased pitting edema at the thighs he has unna boots bilaterally  Respiratory with no wheezing and no rales Abdomen soft and non tender  Data Reviewed:    Family Communication: I spoke with patient's daughter at the bedside, we talked in detail about patient's condition, plan of care and prognosis and all questions were addressed.   Disposition: Status is: Inpatient Remains inpatient appropriate because: heart failure management       Planned Discharge Destination:  CIR       Author: Tawni Millers, MD 04/29/2021 1:40 PM  For on call review [email or web address].

## 2021-04-29 NOTE — Progress Notes (Addendum)
Progress Note  Patient Name: Vincent Allen Date of Encounter: 04/29/2021  Primary Cardiologist:   Pixie Casino, MD   Subjective   Very hard of hearing.  Very taciturn.  Denies pain or SOB. Does not want to get up to the bed today but agrees to sit up on the side of the bed.   Inpatient Medications    Scheduled Meds:  apixaban  2.5 mg Oral BID   Chlorhexidine Gluconate Cloth  6 each Topical Daily   empagliflozin  10 mg Oral Daily   feeding supplement  237 mL Oral BID BM   guaiFENesin  600 mg Oral BID   insulin aspart  0-15 Units Subcutaneous TID WC   multivitamin with minerals  1 tablet Oral Daily   potassium chloride  40 mEq Oral BID   sodium chloride flush  10-40 mL Intracatheter Q12H   Continuous Infusions:  furosemide 120 mg (04/28/21 1733)   milrinone 0.25 mcg/kg/min (04/29/21 0207)   PRN Meds: acetaminophen **OR** acetaminophen   Vital Signs    Vitals:   04/29/21 0400 04/29/21 0401 04/29/21 0402 04/29/21 0600  BP: (!) 131/53     Pulse: 60 (!) 57 (!) 59   Resp: 17 17 19    Temp: 97.9 F (36.6 C)     TempSrc: Oral     SpO2: 92%     Weight:    99.3 kg  Height:        Intake/Output Summary (Last 24 hours) at 04/29/2021 0806 Last data filed at 04/29/2021 0759 Gross per 24 hour  Intake 1247 ml  Output 4850 ml  Net -3603 ml   Filed Weights   04/27/21 0453 04/28/21 0400 04/29/21 0600  Weight: 104.5 kg 102.2 kg 99.3 kg    Telemetry   Demand ventricular pacing- Personally Reviewed  ECG    NA - Personally Reviewed  Physical Exam   GEN: No  acute distress.   Neck:    8 cm JVD Cardiac: Irregular RR, soft systolic murmur, no diastolic murmurs, rubs, or gallops.  Respiratory:    Decreased breath sounds. No crackles GI: Soft, nontender, non-distended, normal bowel sounds  MS:  Severe bilateral leg edema; No deformity. Neuro:   Nonfocal  Psych: Oriented and appropriate    Labs    Chemistry Recent Labs  Lab 04/27/21 0500 04/28/21 0400  04/29/21 0500  NA 132* 132* 135  K 3.0* 3.1* 3.3*  CL 95* 96* 95*  CO2 27 28 30   GLUCOSE 232* 235* 195*  BUN 69* 68* 63*  CREATININE 2.05* 2.03* 1.91*  CALCIUM 8.5* 8.4* 8.7*  GFRNONAA 31* 32* 34*  ANIONGAP 10 8 10      Hematology Recent Labs  Lab 04/25/21 0237  WBC 6.8  RBC 3.32*  HGB 9.1*  HCT 28.1*  MCV 84.6  MCH 27.4  MCHC 32.4  RDW 14.5  PLT 178    Cardiac EnzymesNo results for input(s): TROPONINI in the last 168 hours. No results for input(s): TROPIPOC in the last 168 hours.   BNPNo results for input(s): BNP, PROBNP in the last 168 hours.   DDimer No results for input(s): DDIMER in the last 168 hours.   Radiology    No results found.  Cardiac Studies   ECHO:  1. Windows are limited. Challenging to fully assess wall motion. LVEF  approximately 40-45%. Left ventricular ejection fraction, by estimation,  is 40 to 45%. The left ventricle has mildly decreased function. Left  ventricular diastolic parameters were  normal.  2. Right ventricular systolic function is mildly reduced. The right  ventricular size is normal. There is moderately elevated pulmonary artery  systolic pressure.   3. The mitral valve is grossly normal. No evidence of mitral valve  regurgitation.   4. Tricuspid valve regurgitation is mild to moderate.   5. Aortic valve regurgitation is not visualized. Aortic valve  sclerosis/calcification is present, without any evidence of aortic  stenosis.   6. The inferior vena cava IVC not well visualized.   Comparison(s): No significant change from prior study.   Patient Profile     88 y.o. male combined CHF with EF of 40-45%, persistent atrial fibrillation, s/p PPM, hypertension, hyperlipidemia, type 2 diabetes mellitus, and CKD stage III who was admitted on 04/20/2021 with acute CHF after presenting with progressive shortness of breath and marked volume overload.  Echocardiogram this admission shows ejection fraction 40 to 45%, mild RV  dysfunction, moderate pulmonary hypertension, mild to moderate tricuspid regurgitation.  Assessment & Plan    Acute on chronic systolic and diastolic HF: He continues on furosemide and metolazone.  Remains on milrinone.  Net - 11.6 liters .  I would continue current therapy today.  CoOx 70.4  CKD IIIB: Creatinine down to 1.91.    Atrial fib:  On DOAC.  Rate OK.  Paced  Pacemaker: Question failure to sense.  Interrogation requested.   I don't see that this was done.  Team can call in the AM for interrogation.    For questions or updates, please contact [place] Please consult [email or web address] for contact info under Cardiology/STEMI.   Signed, Minus Breeding, MD  04/29/2021, 8:06 AM

## 2021-04-30 DIAGNOSIS — I10 Essential (primary) hypertension: Secondary | ICD-10-CM

## 2021-04-30 DIAGNOSIS — E0822 Diabetes mellitus due to underlying condition with diabetic chronic kidney disease: Secondary | ICD-10-CM

## 2021-04-30 DIAGNOSIS — N183 Chronic kidney disease, stage 3 unspecified: Secondary | ICD-10-CM

## 2021-04-30 DIAGNOSIS — N179 Acute kidney failure, unspecified: Secondary | ICD-10-CM

## 2021-04-30 LAB — BASIC METABOLIC PANEL
Anion gap: 10 (ref 5–15)
BUN: 64 mg/dL — ABNORMAL HIGH (ref 8–23)
CO2: 31 mmol/L (ref 22–32)
Calcium: 8.7 mg/dL — ABNORMAL LOW (ref 8.9–10.3)
Chloride: 92 mmol/L — ABNORMAL LOW (ref 98–111)
Creatinine, Ser: 1.98 mg/dL — ABNORMAL HIGH (ref 0.61–1.24)
GFR, Estimated: 32 mL/min — ABNORMAL LOW (ref >60)
Glucose, Bld: 236 mg/dL — ABNORMAL HIGH (ref 70–99)
Potassium: 3.5 mmol/L (ref 3.5–5.1)
Sodium: 133 mmol/L — ABNORMAL LOW (ref 135–145)

## 2021-04-30 LAB — GLUCOSE, CAPILLARY
Glucose-Capillary: 212 mg/dL — ABNORMAL HIGH (ref 70–99)
Glucose-Capillary: 206 mg/dL — ABNORMAL HIGH (ref 70–99)
Glucose-Capillary: 163 mg/dL — ABNORMAL HIGH (ref 70–99)
Glucose-Capillary: 208 mg/dL — ABNORMAL HIGH (ref 70–99)

## 2021-04-30 LAB — COOXEMETRY PANEL
Carboxyhemoglobin: 1.7 % — ABNORMAL HIGH (ref 0.5–1.5)
Methemoglobin: 0.9 % (ref 0.0–1.5)
O2 Saturation: 72.5 %
Total hemoglobin: 10 g/dL — ABNORMAL LOW (ref 12.0–16.0)

## 2021-04-30 LAB — MAGNESIUM: Magnesium: 2 mg/dL (ref 1.7–2.4)

## 2021-04-30 MED ORDER — BISACODYL 5 MG PO TBEC
5.0000 mg | DELAYED_RELEASE_TABLET | Freq: Every day | ORAL | Status: DC
  Administered 2021-04-30: 5 mg via ORAL
  Filled 2021-04-30: qty 1

## 2021-04-30 MED ORDER — FUROSEMIDE 40 MG PO TABS
80.0000 mg | ORAL_TABLET | Freq: Every day | ORAL | Status: DC
  Administered 2021-04-30 – 2021-05-01 (×2): 80 mg via ORAL
  Filled 2021-04-30 (×2): qty 2

## 2021-04-30 MED ORDER — INSULIN GLARGINE-YFGN 100 UNIT/ML ~~LOC~~ SOLN
5.0000 [IU] | Freq: Every day | SUBCUTANEOUS | Status: DC
  Administered 2021-04-30 – 2021-05-03 (×4): 5 [IU] via SUBCUTANEOUS
  Filled 2021-04-30 (×4): qty 0.05

## 2021-04-30 NOTE — Progress Notes (Signed)
Physical Therapy Treatment Patient Details Name: Vincent Allen MRN: [id number] DOB: 11/09/1933 Today's Date: 04/30/2021   History of Present Illness Pt is an 88 y.o. male who presented 04/20/21 with x3 week hx of DOE and 20-25 lb weight gain. Pt admitted with volume overload and acute on chronic combined systolic/diastolic congestive heart failure, chronic stage IIIb kidney disease, and hypokalemia/hypomagnesemia. S/p paracentesis 2/4. PMH: CHF, afib, s/p PPM, HTN, HLD, DM2, CKD stage III    PT Comments    The pt was agreeable to session this morning with focus on LE exercises and progressing OOB mobility. He reports some fatigue after OT session earlier in the morning, but was able to demo good movement in LE for exercises as well as progress to completing multiple sit-stand transfers with minA of 1 and single UE support. The pt was challenged by completing x5 in a row from slightly elevated EOB, but all VSS. He continues to require significant assist to complete short bouts of stepping at this time due to impaired stability, activity tolerance, and LE strength. Will continue to benefit from skilled PT acutely and continue to recommend intensive therapies following d/c to facilitate return to full independence.    Recommendations for follow up therapy are one component of a multi-disciplinary discharge planning process, led by the attending physician.  Recommendations may be updated based on patient status, additional functional criteria and insurance authorization.  Follow Up Recommendations  Acute inpatient rehab (3hours/day)     Assistance Recommended at Discharge Frequent or constant Supervision/Assistance  Patient can return home with the following A lot of help with walking and/or transfers;A lot of help with bathing/dressing/bathroom;Assistance with cooking/housework;Assist for transportation   Equipment Recommendations  BSC/3in1;Other (comment);Rolling walker (2 wheels) (tub bench)     Recommendations for Other Services       Precautions / Restrictions Precautions Precautions: Fall Precaution Comments: HOH Restrictions Weight Bearing Restrictions: No     Mobility  Bed Mobility Overal bed mobility: Needs Assistance Bed Mobility: Supine to Sit, Sit to Supine     Supine to sit: Min assist Sit to supine: Mod assist   General bed mobility comments: pt able to move LE OOB and minA to pull on therapist to achieve sitting EOB, then modA to bring BLE into bed    Transfers Overall transfer level: Needs assistance Equipment used: 1 person hand held assist Transfers: Sit to/from Stand Sit to Stand: Min assist           General transfer comment: pt declined use of RW this session and was able to demo good strength with BUE to initiate hip lift from EOB. With increased time and effort, pt able to slowly rise with minA to steady in standing. completed x6    Ambulation/Gait Ambulation/Gait assistance: Mod assist Gait Distance (Feet): 3 Feet Assistive device: 1 person hand held assist Gait Pattern/deviations: Step-to pattern, Decreased stride length, Decreased dorsiflexion - right, Decreased dorsiflexion - left Gait velocity: reduced     General Gait Details: pt with small steps with minimal clearance and modA to steady with movements     Balance Overall balance assessment: Needs assistance Sitting-balance support: No upper extremity supported, Feet supported Sitting balance-Leahy Scale: Fair Sitting balance - Comments: Static sitting EOB with supervision   Standing balance support: Bilateral upper extremity supported Standing balance-Leahy Scale: Poor Standing balance comment: static standing wtih minA and single UE support  Cognition Arousal/Alertness: Awake/alert Behavior During Therapy: WFL for tasks assessed/performed Overall Cognitive Status: Within Functional Limits for tasks assessed                                  General Comments: Follows all commands appropriately.        Exercises General Exercises - Lower Extremity Ankle Circles/Pumps: AROM, Both, 10 reps, Supine Long Arc Quad: AAROM, 15 reps, Seated Heel Slides: AAROM, Both, 15 reps, Supine Other Exercises Other Exercises: repeated sit-stand from EOB x6    General Comments General comments (skin integrity, edema, etc.): VSS on RA      Pertinent Vitals/Pain Pain Assessment Pain Assessment: No/denies pain Pain Intervention(s): Monitored during session     PT Goals (current goals can now be found in the care plan section) Acute Rehab PT Goals Patient Stated Goal: to get better PT Goal Formulation: With patient Time For Goal Achievement: 05/11/21 Potential to Achieve Goals: Good Progress towards PT goals: Progressing toward goals    Frequency    Min 3X/week      PT Plan Current plan remains appropriate       AM-PAC PT "6 Clicks" Mobility   Outcome Measure  Help needed turning from your back to your side while in a flat bed without using bedrails?: A Little Help needed moving from lying on your back to sitting on the side of a flat bed without using bedrails?: A Little Help needed moving to and from a bed to a chair (including a wheelchair)?: A Lot Help needed standing up from a chair using your arms (e.g., wheelchair or bedside chair)?: A Little Help needed to walk in hospital room?: Total Help needed climbing 3-5 steps with a railing? : Total 6 Click Score: 13    End of Session Equipment Utilized During Treatment: Gait belt Activity Tolerance: Patient tolerated treatment well Patient left: in bed;with call bell/phone within reach;with bed alarm set Nurse Communication: Mobility status PT Visit Diagnosis: Unsteadiness on feet (R26.81);Other abnormalities of gait and mobility (R26.89);History of falling (Z91.81);Muscle weakness (generalized) (M62.81);Difficulty in walking, not elsewhere  classified (R26.2);Pain Pain - Right/Left: Right Pain - part of body: Knee     Time: 9563-8756 PT Time Calculation (min) (ACUTE ONLY): 28 min  Charges:  $Therapeutic Exercise: 23-37 mins                     West Carbo, PT, DPT   Acute Rehabilitation Department Pager #: [phone]   Sandra Cockayne 04/30/2021, 11:32 AM

## 2021-04-30 NOTE — Progress Notes (Signed)
Inpatient Rehab Admissions Coordinator:  Saw pt at bedside. Pt not medically cleared for CIR. Will continue to follow.  Gayland Curry, [employer], [place] Admissions Coordinator [phone]

## 2021-04-30 NOTE — Progress Notes (Addendum)
Progress Note   Patient: Vincent Allen DPO:[id number] DOB: September 01, 1933 DOA: 04/20/2021     9 DOS: the patient was seen and examined on 04/30/2021   Brief hospital course: [name] was admitted to the hospital with the working diagnosis of decompensated heart failure.   88 yo male with the past medical history of CKD stage 3, T2DM, HTN, GERD, persistent atrial fibrillation and bradycardia sp pacemaker implantation who presented with dyspnea and weight gain. Reported 3 weeks of worsening dyspnea, along with 20 to 25 lbs weight gain, his nephrologist advised him to come to the ED. On his initial physical examination his blood pressure was 128/81, HR 65, RR 16, and oxygen saturation 100% on room air. Heart with S1 and S2 rhythmic, lungs with no rales or rhonchi, abdomen soft but distended, positive lower extremity edema +++.   Na 135, K 3,2 Cl 99, bicarb 21, glucose 168, bun 75 and cr 2,0  BNP 1,460 High sensitive troponin 83 and 93  Wbc 10,2 hgb 11,9, hct 37 and plt 233  SARS COVID 19 negative   Urine analysis with SG 1,020, negative leukocytes.   Chest radiograph with cardiomegaly with hilar vascular congestion and small left pleural effusion.   EKG 61 bpm, right axis deviation, QTc 541, ventricular paced rhythm with positive PVC, no significant ST segment or T wave changes.   Patient placed on aggressive diuresis with IV furosemide and started on inotropic support with IV milrinone.   02/04 paracentesis with 5 L removed with good toleration   Patient slowly improving volume status but continue with significant hypervolemia, despite aggressive diuretic therapy and inotropic support.   02/12 patient with improvement in volume status with decreased edema and dyspnea.  Continue to be very weak and deconditioned, possible transfer to CIR  02/13 discontinue milrinone infusion and transitioned furosemide to oral route.   Assessment and Plan: * Acute on chronic combined systolic and  diastolic CHF (congestive heart failure) ([place])- (present on admission) SP paracentesis for ascites related heart failure. Echocardiogram with LV EF 40 to 43%, RV systolic function with mild reduction, moderate elevation in systolic pulmonary pressure. Mild to moderate tricuspid valve regurgitation.    Volume status continue to improve, urine output over last 24 hrs is 4,675 ml Blood pressure is 154 to 008 systolic    Plan to discontinue milrinone and transition to oral diuretic therapy.   Continue with empagliflozin and possible addition of low dose B blocker in the following days.   Acute renal failure superimposed on stage 3a chronic kidney disease ([place])- (present on admission) Hypokalemia hyponatremia. Hypomagnesemia  Appropriate urine output with improvement in his volume status.  Renal function with serum cr at 1.98 with K at 3,5 and serum bicarbonate at 31. Mg 2,0 from 1,7  Continue with oral diuresis 80 mg daily and follow up renal function in am. Continue K correction with Kcl.   Essential hypertension- (present on admission) Discontinue milrinone today and plan to continue with oral diuresis. Possible start beta blocker in the next 24 hrs as tolerated.  Persistent atrial fibrillation ([place])- (present on admission) At home on metoprolol 25 mg succinate, that was on hold during low output heart failure decompensation. Continue with apixaban for anticoagulation and possible resumption of B blocker in the next 24 hrs.   Diabetes mellitus with chronic kidney disease ([place])- (present on admission) Uncontrolled hyperglycemia Continue glucose cover and monitoring with insulin sliding scale.  Will add basal insulin 5 units.  At home patient on  sitagliptin and glipizide.   Cardiac device in situ V-pacing, follow cards recs   Malnutrition of moderate degree- (present on admission) Continue with nutritional supplements.   Class 1 obesity- (present on admission) Calculated BMI is  32,2   Elevated troponin-resolved as of 04/25/2021, (present on admission) Mildly elevated with flat trend, do not suspect ACS -Likely demand ischemia in the setting of CHF        Subjective: Patient with improvement in his dyspnea and lower extremity edema, he continue to be very weak and deconditioned   Physical Exam: Vitals:   04/30/21 0500 04/30/21 0848 04/30/21 0958 04/30/21 1130  BP: (!) 128/46 (!) 126/54  (!) 149/61  Pulse: 61 64  63  Resp: 15 18  18   Temp: 98.1 F (36.7 C)   98 F (36.7 C)  TempSrc: Oral   Oral  SpO2: 98% 98% 100% 97%  Weight: 96.8 kg     Height:       Neurology awake and alert ENT with no pallor Cardiovascular with S1 and S2 present and irregular with no gallops or rubs, no murmurs No JVD Lower extremity edema + at the thighs pitting bilaterally with unna boots on both legs, Respiratory with no wheezing or rhonchi on anterior auscultation, no rales.  Abdomen is soft and non tender  Data Reviewed:    Family Communication: no family at the bedside   Disposition: Status is: Inpatient Remains inpatient appropriate because: heart failure management      Planned Discharge Destination: Rehab CIR      Author: Tawni Millers, MD 04/30/2021 2:27 PM  For on call review [email or web address].

## 2021-04-30 NOTE — Progress Notes (Signed)
Progress Note  Patient Name: Vincent Allen Date of Encounter: 04/30/2021  [facility] Cardiologist: Pixie Casino, MD; Bernerd Pho, MD  Subjective   Marked improvement in edema, not limited to his feet and presacral area. Continues have excellent diuresis.  Weight is now 15 kg lower than on admission.  Surette negative fluid balance of 15 L since admission.  On previous office visits his "dry weight" appeared to be in the 228-232 lb range, he is already well beneath that at 213 pounds. Renal parameters have remained stable with a creatinine around 1.9-2.0 and a BUN in the 60s.  Potassium back in normal range today.  Inpatient Medications    Scheduled Meds:  apixaban  2.5 mg Oral BID   Chlorhexidine Gluconate Cloth  6 each Topical Daily   empagliflozin  10 mg Oral Daily   feeding supplement  237 mL Oral BID BM   guaiFENesin  600 mg Oral BID   insulin aspart  0-15 Units Subcutaneous TID WC   multivitamin with minerals  1 tablet Oral Daily   potassium chloride  40 mEq Oral BID   sodium chloride flush  10-40 mL Intracatheter Q12H   Continuous Infusions:  furosemide 120 mg (04/30/21 0854)   milrinone 0.25 mcg/kg/min (04/30/21 1200)   PRN Meds: acetaminophen **OR** acetaminophen   Vital Signs    Vitals:   04/30/21 0500 04/30/21 0848 04/30/21 0958 04/30/21 1130  BP: (!) 128/46 (!) 126/54  (!) 149/61  Pulse: 61 64  63  Resp: 15 18  18   Temp: 98.1 F (36.7 C)   98 F (36.7 C)  TempSrc: Oral   Oral  SpO2: 98% 98% 100% 97%  Weight: 96.8 kg     Height:        Intake/Output Summary (Last 24 hours) at 04/30/2021 1316 Last data filed at 04/30/2021 1131 Gross per 24 hour  Intake 909.63 ml  Output 4575 ml  Net -3665.37 ml   Last 3 Weights 04/30/2021 04/29/2021 04/28/2021  Weight (lbs) 213 lb 8 oz 219 lb 225 lb 5 oz  Weight (kg) 96.843 kg 99.338 kg 102.2 kg      Telemetry    Ventricular paced rhythm- Personally Reviewed  ECG    Ventricular paced rhythm  background rhythm is atrial fibrillation ; note very broad QRS complex  at 210 ms- Personally Reviewed  Physical Exam  Lying fully supine in bed without breathing difficulty GEN: No acute distress.   Neck: No JVD Cardiac: RRR, widely split second heart sound, no murmurs, rubs, or gallops.  Respiratory: Clear to auscultation bilaterally. GI: Soft, nontender, non-distended  MS: Symmetrical 1+ pedal edema, also with some edema on the back of his forearms and presacral he; No deformity. Neuro:  Nonfocal  Psych: Normal affect   Labs    High Sensitivity Troponin:   Recent Labs  Lab 04/20/21 1254 04/20/21 1348  TROPONINIHS 83* 93*     Chemistry Recent Labs  Lab 04/28/21 0400 04/29/21 0500 04/29/21 1704 04/30/21 0139  NA 132* 135 135 133*  K 3.1* 3.3* 3.1* 3.5  CL 96* 95* 92* 92*  CO2 28 30 32 31  GLUCOSE 235* 195* 207* 236*  BUN 68* 63* 65* 64*  CREATININE 2.03* 1.91* 1.92* 1.98*  CALCIUM 8.4* 8.7* 8.7* 8.7*  MG 1.8  --  1.7 2.0  GFRNONAA 32* 34* 34* 32*  ANIONGAP 8 10 11 10     Lipids No results for input(s): CHOL, TRIG, HDL, LABVLDL, LDLCALC, CHOLHDL in the  last 168 hours.  Hematology Recent Labs  Lab 04/25/21 0237  WBC 6.8  RBC 3.32*  HGB 9.1*  HCT 28.1*  MCV 84.6  MCH 27.4  MCHC 32.4  RDW 14.5  PLT 178   Thyroid No results for input(s): TSH, FREET4 in the last 168 hours.  BNPNo results for input(s): BNP, PROBNP in the last 168 hours.  DDimer No results for input(s): DDIMER in the last 168 hours.   Radiology    No results found.  Cardiac Studies   ECHO 205 2023:   1. Windows are limited. Challenging to fully assess wall motion. LVEF  approximately 40-45%. Left ventricular ejection fraction, by estimation,  is 40 to 45%. The left ventricle has mildly decreased function. Left  ventricular diastolic parameters were  normal.   2. Right ventricular systolic function is mildly reduced. The right  ventricular size is normal. There is moderately elevated  pulmonary artery  systolic pressure.   3. The mitral valve is grossly normal. No evidence of mitral valve  regurgitation.   4. Tricuspid valve regurgitation is mild to moderate.   5. Aortic valve regurgitation is not visualized. Aortic valve  sclerosis/calcification is present, without any evidence of aortic  stenosis.   6. The inferior vena cava IVC not well visualized.   Comparison(s): No significant change from prior study.   Patient Profile     88 y.o. male with mildly depressed left ventricular systolic function, persistent atrial fibrillation with 100% ventricular paced rhythm, hypertension, hyperlipidemia, type 2 diabetes mellitus, CKD stage IIIb admitted with acute exacerbation of heart failure with biventricular manifestation  Assessment & Plan    Stop milrinone.  Switch to oral diuretics.  Watch overnight for response to oral diuretic before discharge tomorrow.  He is going to inpatient cardiac rehab when we can fine-tune his diuretic prescription there. May be a good candidate for upgrade to CRT since he has virtually 100% ventricular pacing.     For questions or updates, please contact [place] Please consult [email or web address] for contact info under        Signed, Sanda Klein, MD  04/30/2021, 1:16 PM

## 2021-04-30 NOTE — Plan of Care (Signed)
Problem: Pain Managment: Goal: General experience of comfort will improve Outcome: Completed/Met   Problem: Elimination: Goal: Will not experience complications related to urinary retention Outcome: Completed/Met   Problem: Coping: Goal: Level of anxiety will decrease Outcome: Completed/Met   Problem: Clinical Measurements: Goal: Will remain free from infection Outcome: Completed/Met

## 2021-04-30 NOTE — Progress Notes (Signed)
Occupational Therapy Treatment Patient Details Name: Vincent Allen MRN: [id number] DOB: [birth year] Today's Date: 04/30/2021   History of present illness Pt is an 88 y.o. male who presented 04/20/21 with x3 week hx of DOE and 20-25 lb weight gain. Pt admitted with volume overload and acute on chronic combined systolic/diastolic congestive heart failure, chronic stage IIIb kidney disease, and hypokalemia/hypomagnesemia. S/p paracentesis 2/4. PMH: CHF, afib, s/p PPM, HTN, HLD, DM2, CKD stage III   OT comments  Patient continues to make steady progress towards goals in skilled OT session. Patient's session encompassed functional mobility, transfers, and ADLs. Patient continues to remain limited by fatigue and generalized weakness, but endorses, "I could do whatever I wanted to just 6 weeks ago". Patient able to complete sit<>stand from EOB with max A and complete marches with close min A prior to fatiguing and requesting to return back to bed. Patient continues to make a strong candidate for inpatient rehab as he is motivated and has a promising chance of returning back to independent baseline. Therapy will continue to follow.    Recommendations for follow up therapy are one component of a multi-disciplinary discharge planning process, led by the attending physician.  Recommendations may be updated based on patient status, additional functional criteria and insurance authorization.    Follow Up Recommendations  Acute inpatient rehab (3hours/day)    Assistance Recommended at Discharge Frequent or constant Supervision/Assistance  Patient can return home with the following  A lot of help with walking and/or transfers;A lot of help with bathing/dressing/bathroom   Equipment Recommendations       Recommendations for Other Services      Precautions / Restrictions Precautions Precautions: Fall Precaution Comments: HOH Restrictions Weight Bearing Restrictions: No       Mobility Bed  Mobility Overal bed mobility: Needs Assistance Bed Mobility: Supine to Sit, Sit to Supine     Supine to sit: Min assist Sit to supine: Mod assist, HOB elevated   General bed mobility comments: patient able to use bed rail to come into sitting needs mod A to bring BLEs back into bed    Transfers Overall transfer level: Needs assistance Equipment used: Rolling walker (2 wheels) Transfers: Sit to/from Stand Sit to Stand: Max assist, From elevated surface           General transfer comment: max A to come into sitting, but able to complete marching in place with close min gaurd in order for safety, did not attempt steps as no further assist was present     Balance Overall balance assessment: Needs assistance Sitting-balance support: No upper extremity supported, Feet supported Sitting balance-Leahy Scale: Fair     Standing balance support: Bilateral upper extremity supported Standing balance-Leahy Scale: Poor Standing balance comment: Reliant on RW                           ADL either performed or assessed with clinical judgement   ADL Overall ADL's : Needs assistance/impaired     Grooming: Wash/dry hands;Wash/dry face;Set up;Sitting                   Toilet Transfer: Cueing for safety;Cueing for sequencing;Rolling walker (2 wheels);Maximal assistance Toilet Transfer Details (indicate cue type and reason): max A to come into standing this session Toileting- Clothing Manipulation and Hygiene: Total assistance;Cueing for safety;Cueing for sequencing;Sit to/from stand Toileting - Clothing Manipulation Details (indicate cue type and reason): unable to balance in standing without BUE  on RW     Functional mobility during ADLs: Maximal assistance;Cueing for safety;Cueing for sequencing;Rolling walker (2 wheels) General ADL Comments: Patient continues to demonstrate poor activity tolerance and endurance    Extremity/Trunk Assessment              Vision        Perception     Praxis      Cognition Arousal/Alertness: Awake/alert Behavior During Therapy: WFL for tasks assessed/performed Overall Cognitive Status: Within Functional Limits for tasks assessed                                          Exercises      Shoulder Instructions       General Comments      Pertinent Vitals/ Pain       Pain Assessment Pain Assessment: Faces Faces Pain Scale: Hurts a little bit Pain Location: R knee with flexion Pain Descriptors / Indicators: Grimacing, Guarding, Discomfort Pain Intervention(s): Limited activity within patient's tolerance, Monitored during session, Repositioned  Home Living                                          Prior Functioning/Environment              Frequency  Min 2X/week        Progress Toward Goals  OT Goals(current goals can now be found in the care plan section)  Progress towards OT goals: Progressing toward goals  Acute Rehab OT Goals Patient Stated Goal: to get back to feeling better OT Goal Formulation: With patient Time For Goal Achievement: 05/12/21 Potential to Achieve Goals: Good  Plan Discharge plan remains appropriate    Co-evaluation                 AM-PAC OT "6 Clicks" Daily Activity     Outcome Measure   Help from another person eating meals?: None Help from another person taking care of personal grooming?: A Little Help from another person toileting, which includes using toliet, bedpan, or urinal?: Total Help from another person bathing (including washing, rinsing, drying)?: A Lot Help from another person to put on and taking off regular upper body clothing?: A Little Help from another person to put on and taking off regular lower body clothing?: A Lot 6 Click Score: 15    End of Session Equipment Utilized During Treatment: Gait belt;Rolling walker (2 wheels)  OT Visit Diagnosis: Unsteadiness on feet (R26.81);Other abnormalities  of gait and mobility (R26.89);Repeated falls (R29.6);Pain Pain - Right/Left: Right Pain - part of body: Knee   Activity Tolerance Patient tolerated treatment well   Patient Left in bed;with call bell/phone within reach;with bed alarm set   Nurse Communication Mobility status        Time: 3818-2993 OT Time Calculation (min): 20 min  Charges: OT General Charges $OT Visit: 1 Visit OT Treatments $Self Care/Home Management : 8-22 mins  Corinne Ports E. Kate Sweetman, OTR/L Acute Rehabilitation Services [phone] [place] 04/30/2021, 10:04 AM

## 2021-04-30 NOTE — Progress Notes (Signed)
Inpatient Diabetes Program Recommendations  AACE/ADA: New Consensus Statement on Inpatient Glycemic Control (2015)  Target Ranges:  Prepandial:   less than 140 mg/dL      Peak postprandial:   less than 180 mg/dL (1-2 hours)      Critically ill patients:  140 - 180 mg/dL   Lab Results  Component Value Date   GLUCAP 206 (H) 04/30/2021   HGBA1C 5.8 04/02/2021    Review of Glycemic Control  Latest Reference Range & Units 04/29/21 06:04 04/29/21 11:32 04/29/21 16:11 04/29/21 20:57 04/30/21 05:22 04/30/21 11:30  Glucose-Capillary 70 - 99 mg/dL 170 (H) 241 (H) 181 (H) 192 (H) 212 (H) 206 (H)  (H): Data is abnormally high Diabetes history: DM 2 Outpatient Diabetes medications:  Glucotrol 5 mg bid, Januvia 25 mg bid Current orders for Inpatient glycemic control:  Novolog moderate tid with meals, Jardiance 10 mg  Inpatient Diabetes Program Recommendations:   -If blood sugars remain>180 mg/dL, consider adding Semglee 5 units daily.  Thank you, Nani Gasser. Marshall Kampf, RN, MSN, CDE  Diabetes Coordinator Inpatient Glycemic Control Team Team Pager [phone] (8am-5pm) 04/30/2021 11:42 AM

## 2021-04-30 NOTE — Care Management Important Message (Signed)
Important Message  Patient Details  Name: Vincent Allen MRN: [id number] Date of Birth: 1934/02/19   Medicare Important Message Given:  Yes     Shelda Altes 04/30/2021, 8:31 AM

## 2021-05-01 ENCOUNTER — Other Ambulatory Visit (HOSPITAL_COMMUNITY): Payer: Self-pay

## 2021-05-01 LAB — GLUCOSE, CAPILLARY
Glucose-Capillary: 173 mg/dL — ABNORMAL HIGH (ref 70–99)
Glucose-Capillary: 219 mg/dL — ABNORMAL HIGH (ref 70–99)
Glucose-Capillary: 200 mg/dL — ABNORMAL HIGH (ref 70–99)
Glucose-Capillary: 202 mg/dL — ABNORMAL HIGH (ref 70–99)
Glucose-Capillary: 140 mg/dL — ABNORMAL HIGH (ref 70–99)

## 2021-05-01 LAB — BASIC METABOLIC PANEL
Anion gap: 10 (ref 5–15)
Anion gap: 11 (ref 5–15)
BUN: 61 mg/dL — ABNORMAL HIGH (ref 8–23)
BUN: 59 mg/dL — ABNORMAL HIGH (ref 8–23)
CO2: 33 mmol/L — ABNORMAL HIGH (ref 22–32)
CO2: 33 mmol/L — ABNORMAL HIGH (ref 22–32)
Calcium: 9.2 mg/dL (ref 8.9–10.3)
Calcium: 8.9 mg/dL (ref 8.9–10.3)
Chloride: 91 mmol/L — ABNORMAL LOW (ref 98–111)
Chloride: 90 mmol/L — ABNORMAL LOW (ref 98–111)
Creatinine, Ser: 1.92 mg/dL — ABNORMAL HIGH (ref 0.61–1.24)
Creatinine, Ser: 1.9 mg/dL — ABNORMAL HIGH (ref 0.61–1.24)
GFR, Estimated: 34 mL/min — ABNORMAL LOW (ref >60)
GFR, Estimated: 34 mL/min — ABNORMAL LOW (ref >60)
Glucose, Bld: 179 mg/dL — ABNORMAL HIGH (ref 70–99)
Glucose, Bld: 200 mg/dL — ABNORMAL HIGH (ref 70–99)
Potassium: 2.7 mmol/L — CL (ref 3.5–5.1)
Potassium: 3.5 mmol/L (ref 3.5–5.1)
Sodium: 134 mmol/L — ABNORMAL LOW (ref 135–145)
Sodium: 134 mmol/L — ABNORMAL LOW (ref 135–145)

## 2021-05-01 MED ORDER — POTASSIUM CHLORIDE 10 MEQ/100ML IV SOLN
10.0000 meq | INTRAVENOUS | Status: AC
  Administered 2021-05-01 (×3): 10 meq via INTRAVENOUS
  Filled 2021-05-01 (×3): qty 100

## 2021-05-01 MED ORDER — POTASSIUM CHLORIDE CRYS ER 20 MEQ PO TBCR
40.0000 meq | EXTENDED_RELEASE_TABLET | Freq: Once | ORAL | Status: AC
  Administered 2021-05-01: 40 meq via ORAL
  Filled 2021-05-01: qty 2

## 2021-05-01 NOTE — TOC Benefit Eligibility Note (Signed)
Patient Teacher, English as a foreign language completed.    The patient is currently admitted and upon discharge could be taking Jardiance 10 mg.  The current 30 day co-pay is, $40.00.   The patient is insured through [place], [place] Patient Advocate Specialist [place] Patient Advocate Team Direct Number: [phone]  Fax: [phone]

## 2021-05-01 NOTE — TOC Progression Note (Signed)
Transition of Care [facility]) - Progression Note    Patient Details  Name: Vincent Allen MRN: [id number] Date of Birth: January 04, 1934  [facility] of [facility]) CM/SW Contact  Zenon Mayo, RN Phone Number: 05/01/2021, 2:54 PM  Clinical Narrative:    CIR was denied per Pamala Hurry , MD to do peer to peer by 12 noon tomorrow.  MD is aware and has the phone number and information to contact insurance MD.    Expected Discharge Plan: [place] Barriers to Discharge: Continued Medical Work up  Expected Discharge Plan and Services Expected Discharge Plan: [place]   Discharge Planning Services: CM Consult Post Acute Care Choice: [place] arrangements for the past 2 months: Single Family Home                   DME Agency: NA       HH Arranged: RN, PT HH Agency: [facility] Date [place]: 04/27/21 Time [place]: [id number] Representative spoke with at [place]: [place] ([place]) Interventions    Readmission Risk Interventions No flowsheet data found.

## 2021-05-01 NOTE — Progress Notes (Signed)
Inpatient Rehabilitation Admissions Coordinator   I await peer to peer with Dr Cathlean Sauer and Dr Amalia Hailey of Health Team advantage. Currently CIR has been denied by [facility]. I spoke with his daughter, Joseph Art, by phone and she is aware of the initial denial. She will be at bedside tomorrow and I will follow up with her with final determination.  Danne Baxter, RN, MSN Rehab Admissions Coordinator [phone] 05/01/2021 3:58 PM

## 2021-05-01 NOTE — Progress Notes (Addendum)
Progress Note  Patient Name: Vincent Allen Date of Encounter: 05/01/2021  [facility] Cardiologist: Pixie Casino, MD EP- Jolyn Nap, MD  Subjective   Patient denies chest pain, SOB, palpitations, dizziness. Weight is down to 211 lbs this AM (down from 246.25 on admission).    Inpatient Medications    Scheduled Meds:  apixaban  2.5 mg Oral BID   bisacodyl  5 mg Oral QHS   Chlorhexidine Gluconate Cloth  6 each Topical Daily   empagliflozin  10 mg Oral Daily   feeding supplement  237 mL Oral BID BM   furosemide  80 mg Oral Daily   guaiFENesin  600 mg Oral BID   insulin aspart  0-15 Units Subcutaneous TID WC   insulin glargine-yfgn  5 Units Subcutaneous Daily   multivitamin with minerals  1 tablet Oral Daily   potassium chloride  40 mEq Oral BID   sodium chloride flush  10-40 mL Intracatheter Q12H   Continuous Infusions:  potassium chloride 10 mEq (05/01/21 0748)   PRN Meds: acetaminophen **OR** acetaminophen   Vital Signs    Vitals:   04/30/21 1130 04/30/21 2025 05/01/21 0008 05/01/21 0442  BP: (!) 149/61 (!) 117/44  (!) 135/49  Pulse: 63 61  60  Resp: 18 18  17   Temp: 98 F (36.7 C) 98 F (36.7 C)  97.9 F (36.6 C)  TempSrc: Oral Oral  Oral  SpO2: 97% 94%  97%  Weight:   95.7 kg   Height:        Intake/Output Summary (Last 24 hours) at 05/01/2021 0815 Last data filed at 05/01/2021 0443 Gross per 24 hour  Intake 400 ml  Output 4100 ml  Net -3700 ml   Last 3 Weights 05/01/2021 04/30/2021 04/29/2021  Weight (lbs) 211 lb 213 lb 8 oz 219 lb  Weight (kg) 95.709 kg 96.843 kg 99.338 kg      Telemetry    Ventricular paced rhythm, background rhythm is atrial fibrillation. Heart Rate low 60s  - Personally Reviewed  ECG    No new tracings since 2/8 - Personally Reviewed  Physical Exam   GEN: No acute distress.   Neck: No JVD Cardiac: RRR, no murmurs, rubs, or gallops. Widely split S2  Respiratory: Clear to auscultation bilaterally. GI: Soft,  nontender, non-distended  MS: 1+ pedal edema bilaterally; No deformity. Neuro:  Nonfocal  Psych: Normal affect   Labs    High Sensitivity Troponin:   Recent Labs  Lab 04/20/21 1254 04/20/21 1348  TROPONINIHS 83* 93*     Chemistry Recent Labs  Lab 04/28/21 0400 04/29/21 0500 04/29/21 1704 04/30/21 0139 05/01/21 0500  NA 132*   < > 135 133* 134*  K 3.1*   < > 3.1* 3.5 2.7*  CL 96*   < > 92* 92* 91*  CO2 28   < > 32 31 33*  GLUCOSE 235*   < > 207* 236* 179*  BUN 68*   < > 65* 64* 61*  CREATININE 2.03*   < > 1.92* 1.98* 1.92*  CALCIUM 8.4*   < > 8.7* 8.7* 9.2  MG 1.8  --  1.7 2.0  --   GFRNONAA 32*   < > 34* 32* 34*  ANIONGAP 8   < > 11 10 10    < > = values in this interval not displayed.    Lipids No results for input(s): CHOL, TRIG, HDL, LABVLDL, LDLCALC, CHOLHDL in the last 168 hours.  Hematology Recent Labs  Lab 04/25/21 0237  WBC 6.8  RBC 3.32*  HGB 9.1*  HCT 28.1*  MCV 84.6  MCH 27.4  MCHC 32.4  RDW 14.5  PLT 178   Thyroid No results for input(s): TSH, FREET4 in the last 168 hours.  BNPNo results for input(s): BNP, PROBNP in the last 168 hours.  DDimer No results for input(s): DDIMER in the last 168 hours.   Radiology    No results found.  Cardiac Studies     ECHO 205 2023:   1. Windows are limited. Challenging to fully assess wall motion. LVEF  approximately 40-45%. Left ventricular ejection fraction, by estimation,  is 40 to 45%. The left ventricle has mildly decreased function. Left  ventricular diastolic parameters were  normal.   2. Right ventricular systolic function is mildly reduced. The right  ventricular size is normal. There is moderately elevated pulmonary artery  systolic pressure.   3. The mitral valve is grossly normal. No evidence of mitral valve  regurgitation.   4. Tricuspid valve regurgitation is mild to moderate.   5. Aortic valve regurgitation is not visualized. Aortic valve  sclerosis/calcification is present, without  any evidence of aortic  stenosis.   6. The inferior vena cava IVC not well visualized.   Comparison(s): No significant change from prior study.   Patient Profile     88 y.o. male  with mildly depressed left ventricular systolic function, persistent atrial fibrillation with 100% ventricular paced rhythm, hypertension, hyperlipidemia, type 2 diabetes mellitus, CKD stage IIIb admitted with acute exacerbation of heart failure with biventricular manifestation  Assessment & Plan   Acute on chronic combined systolic and diastolic CHF  -  Echo this admission showed LVEF 02-72%, normal diastolic parameters  - Patient's volume status has been improving. Net -18L fluids since admission. Weight is down to 211lbs this AM (down from 246.25 on admission).  - Transitioned from IV lasix to oral lasix yesterday 2/13. Continued to have good diuresis on oral medications and output 2 L urine overnight  - Discontinued milrinone yesterday 2/13  - Continue jardiance 10 mg daily  - Patient will be discharged to inpatient cardiac rehab. There, we can continue to monitor volume status and adjust diuretic as needed - BP 118/51 this AM, HR in the 60s. Patient was on metoprolol prior to admission, held in the setting of decompensated HF. Will continue to hold due to low BP, low and well controlled HR   - Not on ACE/ARB/ARNI due to kidney function  - Patient already has appointments scheduled with heart and vascular TOC, Dr. Debara Pickett for after discharge.   AKI on Stage IIIa CKD  - Creatinine stable at 1.92, BUN stable at 61 - Continue to avoid nephrotoxic medications  Hypokalemia - K has been low with diuresis, will need to be on K supplementation on discharge as he will be on oral lasix   Persistent Atrial Fibrillation  - On metoprolol at home, this was held in the setting of decompensated heart failure. Will continue to hold at this time due to low HR (60s) and low BP  - Continue eliquis for anticoagulation (on 2.5  mg due to age, renal function)   Symptomatic Bradycardia s/p [employer] PPM  - May be a good candidate for upgrade to CRT as patient has nearly 100% ventricular pacing    For questions or updates, please contact [facility] Please consult [email or web address] for contact info under        Signed, Margie Billet, PA-C  05/01/2021, 8:15 AM    I have seen and examined the patient along with Margie Billet, PA-C .  I have reviewed the chart, notes and new data.  I agree with PA's note.  Key new complaints: Feeling better.  Denies dyspnea at rest or with light activity.  Continues to have excellent urine output after switching to oral diuretics. Key examination changes: Trivial residual edema in his feet. Key new findings / data: Moderately severe hypokalemia, renal function unchanged.  PLAN: Can hold diuretics briefly while we replace his potassium. Ready for transfer to inpatient rehab.  We will continue to adjust dose of oral diuretic and monitor potassium levels while there.   Sanda Klein, MD, [place] [phone] 05/01/2021, 10:06 AM

## 2021-05-01 NOTE — Progress Notes (Signed)
Potassium 2.7. paged DR Opyd. See new orders

## 2021-05-01 NOTE — Progress Notes (Addendum)
Progress Note   Patient: JESON CAMACHO KGY:[id number] DOB: May 20, 1933 DOA: 04/20/2021     10 DOS: the patient was seen and examined on 05/01/2021   Brief hospital course: Mr. Ybarra was admitted to the hospital with the working diagnosis of decompensated heart failure.   88 yo male with the past medical history of CKD stage 3, T2DM, HTN, GERD, persistent atrial fibrillation and bradycardia sp pacemaker implantation who presented with dyspnea and weight gain. Reported 3 weeks of worsening dyspnea, along with 20 to 25 lbs weight gain, his nephrologist advised him to come to the ED. On his initial physical examination his blood pressure was 128/81, HR 65, RR 16, and oxygen saturation 100% on room air. Heart with S1 and S2 rhythmic, lungs with no rales or rhonchi, abdomen soft but distended, positive lower extremity edema +++.   Na 135, K 3,2 Cl 99, bicarb 21, glucose 168, bun 75 and cr 2,0  BNP 1,460 High sensitive troponin 83 and 93  Wbc 10,2 hgb 11,9, hct 37 and plt 233  SARS COVID 19 negative   Urine analysis with SG 1,020, negative leukocytes.   Chest radiograph with cardiomegaly with hilar vascular congestion and small left pleural effusion.   EKG 61 bpm, right axis deviation, QTc 541, ventricular paced rhythm with positive PVC, no significant ST segment or T wave changes.   Patient placed on aggressive diuresis with IV furosemide and started on inotropic support with IV milrinone.   02/04 paracentesis with 5 L removed with good toleration   Patient slowly improving volume status but continue with significant hypervolemia, despite aggressive diuretic therapy and inotropic support.   02/12 patient with improvement in volume status with decreased edema and dyspnea.  Continue to be very weak and deconditioned, possible transfer to CIR  02/13 discontinue milrinone infusion and transitioned furosemide to oral route.  Improved hypervolemia, plan to transfer to CIR when bed available.    Assessment and Plan: * Acute on chronic combined systolic and diastolic CHF (congestive heart failure) ([place])- (present on admission) SP paracentesis for ascites related heart failure. Echocardiogram with LV EF 40 to 02%, RV systolic function with mild reduction, moderate elevation in systolic pulmonary pressure. Mild to moderate tricuspid valve regurgitation.  Patient required inotropic support with milrinone and paracentesis.    Patient with negative fluid balance since admission 18,724 ml with improvement of his symptoms.   His urine output over last 24 hrs is 4,100 ml.   Heart failure therapy with empagliflozin.  Diuretic therapy on hold for today.  Add further guideline directed therapy in the next 24 hrs.   Acute renal failure superimposed on stage 3a chronic kidney disease ([place])- (present on admission) Hypokalemia hyponatremia. Hypomagnesemia  Patient with negative fluid balance.  Renal function with serum cr at 1.92 with K at 2,7 and Na 134 with serum bicarbonate at 33.   Plan to continue K correction with KCL (scheduled to have 110 meq KCl today), plan to follow electrolytes this pm at 16:00  Continue close monitoring renal function and electrolytes.  Holding on loop diuretic therapy for today.   Essential hypertension- (present on admission) Blood pressure stable today with systolic 637 range, yesterday was 149 mmHg. Continue close monitoring off antihypertensive therapy.   Persistent atrial fibrillation ([place])- (present on admission) At home on metoprolol 25 mg succinate,  Rate continue controlled range of 58 to 61 bpm today. Continue anticoagulation with apixaban and continue telemetry monitoring.   Diabetes mellitus with chronic kidney disease ([place])- (  present on admission) Uncontrolled hyperglycemia At home patient on sitagliptin and glipizide.   Fasting glucose this am is 179, will continue close glucose cover and monitoring, he is tolerating po well.   Cardiac  device in situ V-pacing, follow cards recs   Malnutrition of moderate degree- (present on admission) On nutritional supplements.   Class 1 obesity- (present on admission) Calculated BMI is 32,2   Elevated troponin-resolved as of 04/25/2021, (present on admission) Mildly elevated with flat trend, do not suspect ACS -Likely demand ischemia in the setting of CHF        Subjective: Patient is feeling better, with improved dyspnea and lower extremity edema, continue to be very weak and deconditioned.   Physical Exam: Vitals:   05/01/21 0008 05/01/21 0442 05/01/21 0843 05/01/21 1116  BP:  (!) 135/49 (!) 118/51 (!) 117/59  Pulse:  60 61 61  Resp:  17 15 20   Temp:  97.9 F (36.6 C) 98.1 F (36.7 C) (!) 97.4 F (36.3 C)  TempSrc:  Oral Oral Oral  SpO2:  97% 100% 99%  Weight: 95.7 kg     Height:       Neurology awake and alert ENT with mild pallor Cardiovascular with S1 and S2 present and irregular with no gallops or rubs, no murmurs No JVD Trace lower extremity edema at the thighs pitting, legs with unna boots bilaterally Respiratory with no wheezing or rales no rhonchi Abdomen soft and non tender  Data Reviewed:    Family Communication: no family at the bedside   Disposition: Status is: Inpatient Remains inpatient appropriate because: heart failure      Planned Discharge Destination: Rehab CIR      Author: Tawni Millers, MD 05/01/2021 11:22 AM  For on call review [email or web address].

## 2021-05-02 ENCOUNTER — Encounter (HOSPITAL_COMMUNITY): Payer: PPO

## 2021-05-02 LAB — BASIC METABOLIC PANEL
Anion gap: 10 (ref 5–15)
BUN: 57 mg/dL — ABNORMAL HIGH (ref 8–23)
CO2: 34 mmol/L — ABNORMAL HIGH (ref 22–32)
Calcium: 8.7 mg/dL — ABNORMAL LOW (ref 8.9–10.3)
Chloride: 92 mmol/L — ABNORMAL LOW (ref 98–111)
Creatinine, Ser: 1.91 mg/dL — ABNORMAL HIGH (ref 0.61–1.24)
GFR, Estimated: 34 mL/min — ABNORMAL LOW (ref >60)
Glucose, Bld: 124 mg/dL — ABNORMAL HIGH (ref 70–99)
Potassium: 3.2 mmol/L — ABNORMAL LOW (ref 3.5–5.1)
Sodium: 136 mmol/L (ref 135–145)

## 2021-05-02 LAB — MAGNESIUM: Magnesium: 1.7 mg/dL (ref 1.7–2.4)

## 2021-05-02 LAB — GLUCOSE, CAPILLARY
Glucose-Capillary: 139 mg/dL — ABNORMAL HIGH (ref 70–99)
Glucose-Capillary: 184 mg/dL — ABNORMAL HIGH (ref 70–99)
Glucose-Capillary: 159 mg/dL — ABNORMAL HIGH (ref 70–99)
Glucose-Capillary: 130 mg/dL — ABNORMAL HIGH (ref 70–99)

## 2021-05-02 MED ORDER — POTASSIUM CHLORIDE CRYS ER 20 MEQ PO TBCR
40.0000 meq | EXTENDED_RELEASE_TABLET | Freq: Once | ORAL | Status: AC
  Administered 2021-05-02: 40 meq via ORAL
  Filled 2021-05-02: qty 2

## 2021-05-02 MED ORDER — MAGNESIUM SULFATE 2 GM/50ML IV SOLN
2.0000 g | Freq: Once | INTRAVENOUS | Status: AC
  Administered 2021-05-02: 2 g via INTRAVENOUS
  Filled 2021-05-02: qty 50

## 2021-05-02 NOTE — Progress Notes (Signed)
PROGRESS NOTE    Vincent Allen  DGL:[id number] DOB: 1933-10-29 DOA: 04/20/2021 PCP: [name], MD  Brief Narrative: 87/M with chronic systolic and diastolic CHF, EF 51%, persistent A-fib, V paced, type 2 diabetes mellitus, hypertension, dyslipidemia, CKD 3B presented with biventricular predominantly right heart failure -Improved significantly with diuresis, was on milrinone and Lasix drip, now transitioned to p.o. meds, discharge planning   Subjective: -Feels much better overall, now considering going home  Assessment and Plan: * Acute on chronic combined systolic and diastolic CHF (HCC) Moderate MR, right heart failure -Last echo with EF 40-45%, moderate MR -Underwent paracentesis on 2/4, 5 L drained -Diuresed with high-dose IV Lasix and milrinone -Now significantly improved he is 21 L negative -Lasix on hold, continue supplemental potassium -Continue Jardiance, not on ARB/AN RI due to CKD -Discharge planning, hopefully start p.o. Lasix tomorrow, ? BB -Will need close cardiology follow-up, TOC following for SNF versus home with home health services, declined by CIR  Acute renal failure superimposed on stage 3a chronic kidney disease ([place])- (present on admission) Hypokalemia hyponatremia. Hypomagnesemia  -Baseline creatinine around 1.7-2, creatinine stable around 1.9 now, monitor  Persistent atrial fibrillation ([place])- (present on admission) -Metoprolol on hold, continue apixaban  Diabetes mellitus with chronic kidney disease ([place])- (present on admission) -Continue Jardiance, glipizide on hold, sliding scale  Malnutrition of moderate degree- (present on admission) On nutritional supplements.   Class 1 obesity- (present on admission) Calculated BMI is 32,2  Cardiac device in situ V-pacing, follow cards recs   Essential hypertension- (present on admission) -Stable, see discussion above   DVT prophylaxis: Apixaban Code Status: Full code Family Communication:  Daughter at bedside Disposition Plan: To be determined, SNF versus home with home health services   Consultants:  Cardiology  Procedures:   Antimicrobials:    Objective: Vitals:   05/01/21 1955 05/02/21 0353 05/02/21 0359 05/02/21 1120  BP: (!) 129/53 (!) 128/58  (!) 118/59  Pulse: 61 (!) 59  61  Resp: 20 14  (!) 21  Temp: 97.9 F (36.6 C) 98.2 F (36.8 C)  98.2 F (36.8 C)  TempSrc: Oral Oral  Oral  SpO2: 97% 97%  97%  Weight:   94.7 kg   Height:        Intake/Output Summary (Last 24 hours) at 05/02/2021 1205 Last data filed at 05/02/2021 1058 Gross per 24 hour  Intake 610 ml  Output 3650 ml  Net -3040 ml   Filed Weights   04/30/21 0500 05/01/21 0008 05/02/21 0359  Weight: 96.8 kg 95.7 kg 94.7 kg    Examination:  General exam: Pleasant elderly male sitting up in bed, hard of hearing, no distress, AAOx3 HEENT: No JVD CVS: S1-S2, regular rhythm Lungs: Decreased breath sounds to bases Abdomen: Obese, soft, nontender, Extremities: No edema Skin: Chronic venous stasis changes in both legs  Psychiatry: Judgement and insight appear normal. Mood & affect appropriate.     Data Reviewed:   CBC: No results for input(s): WBC, NEUTROABS, HGB, HCT, MCV, PLT in the last 168 hours. Basic Metabolic Panel: Recent Labs  Lab 04/26/21 0453 04/27/21 0500 04/28/21 0400 04/29/21 0500 04/29/21 1704 04/30/21 0139 05/01/21 0500 05/01/21 1635 05/02/21 0539  NA 133*   < > 132*   < > 135 133* 134* 134* 136  K 3.3*   < > 3.1*   < > 3.1* 3.5 2.7* 3.5 3.2*  CL 99   < > 96*   < > 92* 92* 91* 90* 92*  CO2  25   < > 28   < > 32 31 33* 33* 34*  GLUCOSE 185*   < > 235*   < > 207* 236* 179* 200* 124*  BUN 73*   < > 68*   < > 65* 64* 61* 59* 57*  CREATININE 2.26*   < > 2.03*   < > 1.92* 1.98* 1.92* 1.90* 1.91*  CALCIUM 8.4*   < > 8.4*   < > 8.7* 8.7* 9.2 8.9 8.7*  MG 1.8  --  1.8  --  1.7 2.0  --   --  1.7   < > = values in this interval not displayed.   GFR: Estimated  Creatinine Clearance: 33.8 mL/min (A) (by C-G formula based on SCr of 1.91 mg/dL (H)). Liver Function Tests: No results for input(s): AST, ALT, ALKPHOS, BILITOT, PROT, ALBUMIN in the last 168 hours. No results for input(s): LIPASE, AMYLASE in the last 168 hours. No results for input(s): AMMONIA in the last 168 hours. Coagulation Profile: No results for input(s): INR, PROTIME in the last 168 hours. Cardiac Enzymes: No results for input(s): CKTOTAL, CKMB, CKMBINDEX, TROPONINI in the last 168 hours. BNP (last 3 results) No results for input(s): PROBNP in the last 8760 hours. HbA1C: No results for input(s): HGBA1C in the last 72 hours. CBG: Recent Labs  Lab 05/01/21 1113 05/01/21 1621 05/01/21 1724 05/01/21 2102 05/02/21 0607  GLUCAP 219* 200* 202* 140* 139*   Lipid Profile: No results for input(s): CHOL, HDL, LDLCALC, TRIG, CHOLHDL, LDLDIRECT in the last 72 hours. Thyroid Function Tests: No results for input(s): TSH, T4TOTAL, FREET4, T3FREE, THYROIDAB in the last 72 hours. Anemia Panel: No results for input(s): VITAMINB12, FOLATE, FERRITIN, TIBC, IRON, RETICCTPCT in the last 72 hours. Urine analysis:    Component Value Date/Time   COLORURINE YELLOW 04/20/2021 2041   APPEARANCEUR CLEAR 04/20/2021 2041   LABSPEC 1.020 04/20/2021 2041   PHURINE 5.5 04/20/2021 2041   GLUCOSEU NEGATIVE 04/20/2021 2041   GLUCOSEU NEGATIVE 08/10/2020 1513   HGBUR NEGATIVE 04/20/2021 2041   BILIRUBINUR NEGATIVE 04/20/2021 2041   BILIRUBINUR Negative 04/02/2021 1605   KETONESUR NEGATIVE 04/20/2021 2041   PROTEINUR NEGATIVE 04/20/2021 2041   UROBILINOGEN 0.2 04/02/2021 1605   UROBILINOGEN 1.0 08/10/2020 1513   NITRITE NEGATIVE 04/20/2021 2041   LEUKOCYTESUR NEGATIVE 04/20/2021 2041   Sepsis Labs: @LABRCNTIP (procalcitonin:4,lacticidven:4)  )No results found for this or any previous visit (from the past 240 hour(s)).   Radiology Studies: No results found.   Scheduled Meds:  apixaban  2.5 mg  Oral BID   bisacodyl  5 mg Oral QHS   Chlorhexidine Gluconate Cloth  6 each Topical Daily   empagliflozin  10 mg Oral Daily   feeding supplement  237 mL Oral BID BM   guaiFENesin  600 mg Oral BID   insulin aspart  0-15 Units Subcutaneous TID WC   insulin glargine-yfgn  5 Units Subcutaneous Daily   multivitamin with minerals  1 tablet Oral Daily   potassium chloride  40 mEq Oral BID   sodium chloride flush  10-40 mL Intracatheter Q12H   Continuous Infusions:   LOS: 11 days    Time spent: 67min    Domenic Polite, MD [facility]   05/02/2021, 12:05 PM

## 2021-05-02 NOTE — Progress Notes (Signed)
Inpatient Rehabilitation Admissions Coordinator   I have notified Dr Broadus John of request to complete peer to peer with [employer] today.  Danne Baxter, RN, MSN Rehab Admissions Coordinator [phone] 05/02/2021 8:48 AM

## 2021-05-02 NOTE — Progress Notes (Signed)
Inpatient Rehabilitation Admissions Coordinator   I met at bedside with patient and his daughter, Joseph Art. They are aware of denial from [employer] after peer to peer with Dr Cathlean Sauer with Dr Amalia Hailey. I have alerted acute team and TOC. We will sign off at this time.  Danne Baxter, RN, MSN Rehab Admissions Coordinator [phone] 05/02/2021 10:19 AM

## 2021-05-02 NOTE — Progress Notes (Addendum)
Progress Note  Patient Name: Vincent Allen Date of Encounter: 05/02/2021  [facility] Cardiologist: Pixie Casino, MD   Subjective   Patient denies any chest pain, SOB, dizziness, palpitations. Feels that the swelling in his legs is much better. Daughter at bedside.   Inpatient Medications    Scheduled Meds:  apixaban  2.5 mg Oral BID   bisacodyl  5 mg Oral QHS   Chlorhexidine Gluconate Cloth  6 each Topical Daily   empagliflozin  10 mg Oral Daily   feeding supplement  237 mL Oral BID BM   guaiFENesin  600 mg Oral BID   insulin aspart  0-15 Units Subcutaneous TID WC   insulin glargine-yfgn  5 Units Subcutaneous Daily   multivitamin with minerals  1 tablet Oral Daily   potassium chloride  40 mEq Oral BID   sodium chloride flush  10-40 mL Intracatheter Q12H   Continuous Infusions:  PRN Meds: acetaminophen **OR** acetaminophen   Vital Signs    Vitals:   05/01/21 1354 05/01/21 1955 05/02/21 0353 05/02/21 0359  BP: (!) 128/59 (!) 129/53 (!) 128/58   Pulse: 60 61 (!) 59   Resp: 15 20 14    Temp: (!) 97.5 F (36.4 C) 97.9 F (36.6 C) 98.2 F (36.8 C)   TempSrc: Oral Oral Oral   SpO2: 97% 97% 97%   Weight:    94.7 kg  Height:        Intake/Output Summary (Last 24 hours) at 05/02/2021 0849 Last data filed at 05/02/2021 0835 Gross per 24 hour  Intake 600 ml  Output 4350 ml  Net -3750 ml   Last 3 Weights 05/02/2021 05/01/2021 04/30/2021  Weight (lbs) 208 lb 12.4 oz 211 lb 213 lb 8 oz  Weight (kg) 94.7 kg 95.709 kg 96.843 kg      Telemetry    Predominantly ventricular paced rhythm, HR in the 60s-low 70s - Personally Reviewed  ECG    No new tracings - Personally Reviewed  Physical Exam   GEN: No acute distress.   Neck: No JVD Cardiac: RRR, no murmurs, rubs, or gallops. Widely split S2 Respiratory: Clear to auscultation bilaterally. GI: Soft, nontender, non-distended  MS: No edema; No deformity. Legs wrapped in bandages  Neuro:  Nonfocal  Psych:  Normal affect   Labs    High Sensitivity Troponin:   Recent Labs  Lab 04/20/21 1254 04/20/21 1348  TROPONINIHS 83* 93*     Chemistry Recent Labs  Lab 04/29/21 1704 04/30/21 0139 05/01/21 0500 05/01/21 1635 05/02/21 0539  NA 135 133* 134* 134* 136  K 3.1* 3.5 2.7* 3.5 3.2*  CL 92* 92* 91* 90* 92*  CO2 32 31 33* 33* 34*  GLUCOSE 207* 236* 179* 200* 124*  BUN 65* 64* 61* 59* 57*  CREATININE 1.92* 1.98* 1.92* 1.90* 1.91*  CALCIUM 8.7* 8.7* 9.2 8.9 8.7*  MG 1.7 2.0  --   --  1.7  GFRNONAA 34* 32* 34* 34* 34*  ANIONGAP 11 10 10 11 10     Lipids No results for input(s): CHOL, TRIG, HDL, LABVLDL, LDLCALC, CHOLHDL in the last 168 hours.  HematologyNo results for input(s): WBC, RBC, HGB, HCT, MCV, MCH, MCHC, RDW, PLT in the last 168 hours. Thyroid No results for input(s): TSH, FREET4 in the last 168 hours.  BNPNo results for input(s): BNP, PROBNP in the last 168 hours.  DDimer No results for input(s): DDIMER in the last 168 hours.   Radiology    No results found.  Cardiac Studies     ECHO 04/22/2021:   1. Windows are limited. Challenging to fully assess wall motion. LVEF  approximately 40-45%. Left ventricular ejection fraction, by estimation,  is 40 to 45%. The left ventricle has mildly decreased function. Left  ventricular diastolic parameters were  normal.   2. Right ventricular systolic function is mildly reduced. The right  ventricular size is normal. There is moderately elevated pulmonary artery  systolic pressure.   3. The mitral valve is grossly normal. No evidence of mitral valve  regurgitation.   4. Tricuspid valve regurgitation is mild to moderate.   5. Aortic valve regurgitation is not visualized. Aortic valve  sclerosis/calcification is present, without any evidence of aortic  stenosis.   6. The inferior vena cava IVC not well visualized.   Comparison(s): No significant change from prior study.   Patient Profile     88 y.o. male  with mildly depressed  left ventricular systolic function, persistent atrial fibrillation with 100% ventricular paced rhythm, hypertension, hyperlipidemia, type 2 diabetes mellitus, CKD stage IIIb admitted with acute exacerbation of heart failure with biventricular manifestation  Assessment & Plan    Acute on chronic combined systolic and diastolic CHF  - Echo this admission showed LVEF 56-25%, normal diastolic parameters - Volume status continues to improve, net -21.6 L fluids since admission. Weight down to 208.7 lbs this AM (down from 246.25 lbs on admission)  - Oral lasix currently being held as hypokalemia is corrected. Will plan to restart lasix 80mg  PO daily as K improves. Will need to be on K supplementation at home  - Continue jardiace 10 mg daily  - Patient was on metoprolol prior to admission, was held in the setting of decompensated HF. As BP improves, can plan to add back low dose BB. HR in the 60s-70s per telemetry, should be able to tolerate low dose BB with PPM  - Not on ACE/ARB/ARNI due to kidney function  - Per chart, patient's insurance denied Inpatient cardiac rehab. Primary MD will be arranging discharge planning  - With patient not being in inpatient rehab, he will need his K levels monitored and lasix dose adjusted while outpatient. Patient has a heart vascular TOC appointment on 2/20 and an appointment with Dr. Debara Pickett on 2/22. Will need BMP at follow up to monitor K and kidney function   AKI on Stage IIIa CKD  - Creatinine stable at 1.91 (baseline around 1.7-2) - Continue to avoid nephrotoxic medications   Hypokalemia  - K has been low with diuresis, will need to be on K supplementation on discharge as he will be on oral lasix   Persistent Atrial Fibrillation  - On metoprolol at home, this was held in the setting of decompensated heart failure.  Can restart a low dose BB as BP continues to improve.  - Continue eliquis for anticoagulation (on 2.5 mg due to age, renal function)   Symptomatic  Bradycardia s/p [employer] PPM  - May be a good candidate for upgrade to CRT as patient has nearly 100% ventricular pacing (99.7% of the time is v-paced)    For questions or updates, please contact [facility] Please consult [email or web address] for contact info under        Signed, Margie Billet, PA-C  05/02/2021, 8:49 AM     I have seen and examined the patient along with Margie Billet, PA-C .  I have reviewed the chart, notes and new data.  I agree with his note.  Key new  complaints: Continues to improve, leg edema almost completely gone, feet still puffy.  Spontaneously diuresing without diuretics last 24 hours. Key examination changes: Mild pedal edema.  Down to 95 kg (209 pounds). Key new findings / data: Stable creatinine at 1.9.  Potassium 3.2.  PLAN: Spontaneous diuresis probably with improved RV output with less dilation and improved ventricular geometry. Still hypokalemic. Start diuretic tomorrow. Should be ready to DC by tomorrow from HF point of view. Would have a plan for increased diuretic dose for weight gain >3 lb/day, >5 lb/week or whenever he reaches 210 lb. Will send note to Dr. Caryl Comes for evaluation of possible CRT upgrade, if felt appropriate. CIR denied by insurance, so home with PT versus NH based on PT evaluation?  Sanda Klein, MD, [place] [phone] 05/02/2021, 11:21 AM

## 2021-05-03 LAB — BASIC METABOLIC PANEL
Anion gap: 10 (ref 5–15)
BUN: 50 mg/dL — ABNORMAL HIGH (ref 8–23)
CO2: 32 mmol/L (ref 22–32)
Calcium: 8.6 mg/dL — ABNORMAL LOW (ref 8.9–10.3)
Chloride: 93 mmol/L — ABNORMAL LOW (ref 98–111)
Creatinine, Ser: 1.64 mg/dL — ABNORMAL HIGH (ref 0.61–1.24)
GFR, Estimated: 41 mL/min — ABNORMAL LOW (ref >60)
Glucose, Bld: 105 mg/dL — ABNORMAL HIGH (ref 70–99)
Potassium: 3.6 mmol/L (ref 3.5–5.1)
Sodium: 135 mmol/L (ref 135–145)

## 2021-05-03 LAB — CBC
HCT: 35.3 % — ABNORMAL LOW (ref 39.0–52.0)
Hemoglobin: 11.4 g/dL — ABNORMAL LOW (ref 13.0–17.0)
MCH: 27.5 pg (ref 26.0–34.0)
MCHC: 32.3 g/dL (ref 30.0–36.0)
MCV: 85.3 fL (ref 80.0–100.0)
Platelets: 221 10*3/uL (ref 150–400)
RBC: 4.14 MIL/uL — ABNORMAL LOW (ref 4.22–5.81)
RDW: 15 % (ref 11.5–15.5)
WBC: 6.7 10*3/uL (ref 4.0–10.5)
nRBC: 0 % (ref 0.0–0.2)

## 2021-05-03 LAB — GLUCOSE, CAPILLARY
Glucose-Capillary: 119 mg/dL — ABNORMAL HIGH (ref 70–99)
Glucose-Capillary: 183 mg/dL — ABNORMAL HIGH (ref 70–99)
Glucose-Capillary: 184 mg/dL — ABNORMAL HIGH (ref 70–99)

## 2021-05-03 MED ORDER — FUROSEMIDE 40 MG PO TABS
80.0000 mg | ORAL_TABLET | Freq: Every day | ORAL | Status: DC
  Administered 2021-05-03: 80 mg via ORAL
  Filled 2021-05-03: qty 2

## 2021-05-03 MED ORDER — EMPAGLIFLOZIN 10 MG PO TABS: 10.0000 mg | ORAL_TABLET | Freq: Every day | ORAL | 0 refills | Status: DC

## 2021-05-03 MED ORDER — FUROSEMIDE 40 MG PO TABS: 80.0000 mg | ORAL_TABLET | Freq: Every day | ORAL | 1 refills | Status: DC

## 2021-05-03 MED ORDER — METOPROLOL SUCCINATE ER 25 MG PO TB24: 25.0000 mg | ORAL_TABLET | Freq: Every day | ORAL | Status: DC

## 2021-05-03 MED ORDER — SPIRONOLACTONE 12.5 MG HALF TABLET
12.5000 mg | ORAL_TABLET | Freq: Every day | ORAL | Status: DC
  Administered 2021-05-03: 12.5 mg via ORAL
  Filled 2021-05-03: qty 1

## 2021-05-03 MED ORDER — POTASSIUM CHLORIDE CRYS ER 20 MEQ PO TBCR: 40.0000 meq | EXTENDED_RELEASE_TABLET | Freq: Every day | ORAL | 0 refills | Status: DC

## 2021-05-03 MED ORDER — METOPROLOL SUCCINATE ER 25 MG PO TB24
25.0000 mg | ORAL_TABLET | Freq: Every day | ORAL | Status: DC
  Administered 2021-05-03: 25 mg via ORAL
  Filled 2021-05-03: qty 1

## 2021-05-03 MED ORDER — METOPROLOL SUCCINATE ER 25 MG PO TB24
12.5000 mg | ORAL_TABLET | Freq: Every day | ORAL | Status: DC
Start: 2021-05-03 — End: 2021-05-03

## 2021-05-03 MED ORDER — SPIRONOLACTONE 25 MG PO TABS: 12.5000 mg | ORAL_TABLET | Freq: Every day | ORAL | 0 refills | Status: DC

## 2021-05-03 NOTE — Social Work (Signed)
CSW spoke with pt in room and informed CSW that he and his daughter both agreed that he should go home. Pt has DC orders for home DC. CSW will sign off.

## 2021-05-03 NOTE — TOC Transition Note (Signed)
Transition of Care [facility]) - CM/SW Discharge Note   Patient Details  Name: Vincent Allen MRN: [id number] Date of Birth: Sep 12, 1933  [facility]) CM/SW Contact:  Zenon Mayo, RN Phone Number: 05/03/2021, 12:46 PM   Clinical Narrative:    Patient is for dc today, he is set up with [facility].   NCM informed Anderson Malta that patient is for dc today.  His wife will transport him home. Picc line to be taken out prior to dc.    Final next level of care: [place] Barriers to Discharge: No Barriers Identified   Patient Goals and CMS Choice Patient states their goals for this hospitalization and ongoing recovery are:: return home CMS Medicare.gov Compare Post Acute Care list provided to:: Patient Choice offered to / list presented to : Patient  Discharge Placement                       Discharge Plan and Services   Discharge Planning Services: CM Consult Post Acute Care Choice: Home Health            DME Agency: NA       HH Arranged: RN, PT, OT, Nurse's Aide [place] Agency: [facility] Date [place]: 04/27/21 Time [place]: 9311 Representative spoke with at [place]: [place] Determinants of Health ([place]) Interventions     Readmission Risk Interventions No flowsheet data found.

## 2021-05-03 NOTE — Progress Notes (Signed)
Physical Therapy Treatment Patient Details Name: Vincent Allen MRN: [id number] DOB: September 05, 1933 Today's Date: 05/03/2021   History of Present Illness Pt is an 88 y.o. male who presented 04/20/21 with x3 week hx of DOE and 20-25 lb weight gain. Pt admitted with volume overload and acute on chronic combined systolic/diastolic congestive heart failure, chronic stage IIIb kidney disease, and hypokalemia/hypomagnesemia. S/p paracentesis 2/4. PMH: CHF, afib, s/p PPM, HTN, HLD, DM2, CKD stage III    PT Comments    The pt was agreeable to session this afternoon stating he has no concerns about mobility or return home. The pt continues to require mod-maxA to stand from any surface in the room (EOB or recliner) and benefits from max cues for increased forward momentum and use of UE to power up in addition to mod-maxA from therapist. The pt also states he will not use a RW in the home (although he does have one) so we tried ambulation with use of quad cane only. The pt had frequent LOB in multiple directions and required up to modA to maintain upright and continue with ambulation. The pt was able to pick up an item from the floor but continued to require significant UE support on bed rail to steady while reaching outside BOS. I understand the pt is eager to return home with HHPT, the pt remains at significant risk of falls with any OOB mobility and will benefit from hands-on assist from family for all mobility at d/c. Recommend max HHPT to improve functional strength and power for transfers as well as endurance and balance to decrease need for assist with ambulation in the home.    Recommendations for follow up therapy are one component of a multi-disciplinary discharge planning process, led by the attending physician.  Recommendations may be updated based on patient status, additional functional criteria and insurance authorization.  Follow Up Recommendations  Home health PT     Assistance Recommended at  Discharge Frequent or constant Supervision/Assistance  Patient can return home with the following A lot of help with walking and/or transfers;Two people to help with walking and/or transfers;A lot of help with bathing/dressing/bathroom;Assistance with cooking/housework;Assist for transportation   Equipment Recommendations  BSC/3in1;Other (comment) (tub bench)    Recommendations for Other Services       Precautions / Restrictions Precautions Precautions: Fall Precaution Comments: HOH Restrictions Weight Bearing Restrictions: No     Mobility  Bed Mobility Overal bed mobility: Needs Assistance Bed Mobility: Supine to Sit     Supine to sit: Min assist, HOB elevated     General bed mobility comments: minA with increased time and heavy use of bed rails    Transfers Overall transfer level: Needs assistance Equipment used: Quad cane Transfers: Sit to/from Stand Sit to Stand: Max assist, Mod assist           General transfer comment: pt unable to stand from EOB or recliner without mod-maxA and max cues for initiating forward momentum. strong posterior lean which pt is dependent on therapist to pull him up from low surface. pt states he has a lift chair and therefore does not need to be able to stand from chair height    Ambulation/Gait Ambulation/Gait assistance: Mod assist, Min assist Gait Distance (Feet): 75 Feet Assistive device: Quad cane, 1 person hand held assist Gait Pattern/deviations: Step-to pattern, Shuffle, Staggering left, Staggering right Gait velocity: reduced     General Gait Details: pt with narrow steps and frequent LOB needing modA to correct. States he  is more stable with use of SPC vs quad cane I supplied       Balance Overall balance assessment: Needs assistance Sitting-balance support: Feet supported Sitting balance-Leahy Scale: Fair   Postural control: Posterior lean Standing balance support: Bilateral upper extremity supported Standing  balance-Leahy Scale: Poor Standing balance comment: BUE support and modA to maintain with ambulation. pt states he will not use RW at home so given quad cane and single HHA                            Cognition Arousal/Alertness: Awake/alert Behavior During Therapy: WFL for tasks assessed/performed Overall Cognitive Status: Within Functional Limits for tasks assessed                                 General Comments: pt follows commands, decreased insight to safety and need for assistance.        Exercises      General Comments General comments (skin integrity, edema, etc.): VSS on RA      Pertinent Vitals/Pain Pain Assessment Pain Assessment: No/denies pain Pain Intervention(s): Monitored during session     PT Goals (current goals can now be found in the care plan section) Acute Rehab PT Goals Patient Stated Goal: to get better PT Goal Formulation: With patient Time For Goal Achievement: 05/11/21 Potential to Achieve Goals: Good Progress towards PT goals: Progressing toward goals    Frequency    Min 3X/week      PT Plan Discharge plan needs to be updated       AM-PAC PT "6 Clicks" Mobility   Outcome Measure  Help needed turning from your back to your side while in a flat bed without using bedrails?: A Little Help needed moving from lying on your back to sitting on the side of a flat bed without using bedrails?: A Little Help needed moving to and from a bed to a chair (including a wheelchair)?: A Lot Help needed standing up from a chair using your arms (e.g., wheelchair or bedside chair)?: A Lot Help needed to walk in hospital room?: A Lot Help needed climbing 3-5 steps with a railing? : Total 6 Click Score: 13    End of Session Equipment Utilized During Treatment: Gait belt Activity Tolerance: Patient tolerated treatment well Patient left: in chair;with call bell/phone within reach Nurse Communication: Mobility status PT Visit  Diagnosis: Unsteadiness on feet (R26.81);Other abnormalities of gait and mobility (R26.89);History of falling (Z91.81);Muscle weakness (generalized) (M62.81);Difficulty in walking, not elsewhere classified (R26.2);Pain Pain - Right/Left: Right     Time: 9407-6808 PT Time Calculation (min) (ACUTE ONLY): 31 min  Charges:  $Therapeutic Exercise: 23-37 mins                     [place], PT, DPT   Acute Rehabilitation Department Pager #: [phone]   Sandra Cockayne 05/03/2021, 2:13 PM

## 2021-05-03 NOTE — Progress Notes (Signed)
Heart Failure Nurse Navigator Progress Note Patient appt moved to 05/11/2021 secondary to patient's anticipated discharge date.  Discharge navigator updated with AVS information

## 2021-05-03 NOTE — Care Management Important Message (Signed)
Important Message  Patient Details  Name: Vincent Allen MRN: [id number] Date of Birth: 1934/02/07   Medicare Important Message Given:  Yes     Shelda Altes 05/03/2021, 9:11 AM

## 2021-05-03 NOTE — Progress Notes (Signed)
Occupational Therapy Treatment Patient Details Name: Vincent Allen MRN: [id number] DOB: 06/22/1933 Today's Date: 05/03/2021   History of present illness Pt is an 88 y.o. male who presented 04/20/21 with x3 week hx of DOE and 20-25 lb weight gain. Pt admitted with volume overload and acute on chronic combined systolic/diastolic congestive heart failure, chronic stage IIIb kidney disease, and hypokalemia/hypomagnesemia. S/p paracentesis 2/4. PMH: CHF, afib, s/p PPM, HTN, HLD, DM2, CKD stage III   OT comments  Pt presented sitting at EOB. Pt from very elevated surface of bed was min guard to stand but in chair level was max assistance. Pt reported when home they only use lift chair and does not sit on any other surfaces in the home. Pt declined to use walker at the start but then attempted to pull/steady onto other items and was shown the benefit of a FW with mobility. Pt at this time was shown and educated on how to complete LE dressing as decrease in ability to complete at this time and requiring moderate assist with no AE. Pt at this time may decline SNF and if they do the next best recommendation would be HH therapies and aide if possible. Pt currently with functional limitations due to the deficits listed below (see OT Problem List).  Pt will benefit from skilled OT to increase their safety and independence with ADL and functional mobility for ADL to facilitate discharge to venue listed below.     Recommendations for follow up therapy are one component of a multi-disciplinary discharge planning process, led by the attending physician.  Recommendations may be updated based on patient status, additional functional criteria and insurance authorization.    Follow Up Recommendations  Skilled nursing-short term rehab (<3 hours/day) (Pt may decline SNF at this time and if they do will need HH with the max about of support if possible.)    Assistance Recommended at Discharge Frequent or constant  Supervision/Assistance  Patient can return home with the following  A lot of help with bathing/dressing/bathroom;Assistance with cooking/housework;Assist for transportation   Equipment Recommendations  BSC/3in1 (pt at baseline reported sponge bath, long handle reacher and sock aide)    Recommendations for Other Services      Precautions / Restrictions Precautions Precautions: Fall Precaution Comments: HOH Restrictions Weight Bearing Restrictions: No       Mobility Bed Mobility               General bed mobility comments: pt presented sitting at EOB    Transfers Overall transfer level: Needs assistance Equipment used: Rolling walker (2 wheels) (Pt from very elevated surface of bed was min guard but from chair was max assist. Pt uses a lift chair at home and reports they do not sit anywhere else in the home) Transfers: Sit to/from Stand Sit to Stand: Min guard, From elevated surface (very elevated surfaces min guar but from low chair requires max assistance)           General transfer comment: Pt needed to be directed to use walker as attmepting to pull on other itesms even when presenting to use.     Balance Overall balance assessment: Needs assistance Sitting-balance support: Feet supported Sitting balance-Leahy Scale: Fair Sitting balance - Comments: noted at first posterior leaning Postural control: Posterior lean Standing balance support: Bilateral upper extremity supported Standing balance-Leahy Scale: Poor Standing balance comment: Pt able to complete some LB dressing ADLS while standing and letting go  ADL either performed or assessed with clinical judgement   ADL Overall ADL's : Needs assistance/impaired Eating/Feeding: Sitting;Modified independent Eating/Feeding Details (indicate cue type and reason): sitting at EOB eating on first attempt noted posterior tilt backwards Grooming: Wash/dry hands;Wash/dry face;Set  up;Sitting   Upper Body Bathing: Minimal assistance;Cueing for safety;Cueing for sequencing;Sitting   Lower Body Bathing: Moderate assistance;Cueing for safety;Cueing for sequencing;Sit to/from stand Lower Body Bathing Details (indicate cue type and reason): Pt neeeds assist with starting dressing over B feet then able to get from knees Upper Body Dressing : Minimal assistance;Sitting   Lower Body Dressing: Moderate assistance;Cueing for safety;Cueing for sequencing;Sit to/from stand   Toilet Transfer: Min guard;Cueing for safety;Cueing for sequencing;BSC/3in1   Toileting- Water quality scientist and Hygiene: Minimal assistance;Cueing for safety;Cueing for sequencing;Sit to/from stand       Functional mobility during ADLs: Min guard;Rolling walker (2 wheels)      Extremity/Trunk Assessment Upper Extremity Assessment Upper Extremity Assessment: RUE deficits/detail RUE Deficits / Details: Pt reported chronic issues with BUE  and can be painful with AROM and PROM. limited to about 20-30 degrees LUE Deficits / Details: Pt reported chronic issues with BUE  and can be painful with AROM and PROM.limited to about 85 degrees of flexion   Lower Extremity Assessment Lower Extremity Assessment: Defer to PT evaluation        Vision       Perception     Praxis      Cognition Arousal/Alertness: Awake/alert Behavior During Therapy: WFL for tasks assessed/performed Overall Cognitive Status: Within Functional Limits for tasks assessed                                 General Comments: Follows all commands appropriately.        Exercises Exercises: General Upper Extremity General Exercises - Upper Extremity Shoulder Flexion: AROM, 10 reps, Left Chair Push Up: 10 reps, Seated, Strengthening    Shoulder Instructions       General Comments      Pertinent Vitals/ Pain       Pain Assessment Pain Assessment: Faces Faces Pain Scale: Hurts a little bit Pain Location:  sometimes with RUE due to liitations Pain Descriptors / Indicators: Discomfort Pain Intervention(s): Monitored during session  Home Living                                          Prior Functioning/Environment              Frequency  Min 2X/week        Progress Toward Goals  OT Goals(current goals can now be found in the care plan section)  Progress towards OT goals: Progressing toward goals  Acute Rehab OT Goals Patient Stated Goal: to go home OT Goal Formulation: With patient Time For Goal Achievement: 05/12/21 Potential to Achieve Goals: Good ADL Goals Pt Will Perform Upper Body Bathing: with modified independence;sitting Pt Will Perform Lower Body Bathing: with modified independence;sit to/from stand Pt Will Transfer to Toilet: with supervision;ambulating;regular height toilet;grab bars Pt Will Perform Tub/Shower Transfer: with supervision;ambulating;shower seat;grab bars  Plan Discharge plan needs to be updated    Co-evaluation                 AM-PAC OT "6 Clicks" Daily Activity     Outcome Measure   Help  from another person eating meals?: None Help from another person taking care of personal grooming?: A Little Help from another person toileting, which includes using toliet, bedpan, or urinal?: A Little Help from another person bathing (including washing, rinsing, drying)?: A Lot Help from another person to put on and taking off regular upper body clothing?: A Little Help from another person to put on and taking off regular lower body clothing?: A Lot 6 Click Score: 17    End of Session Equipment Utilized During Treatment: Gait belt;Rolling walker (2 wheels)  OT Visit Diagnosis: Unsteadiness on feet (R26.81);Other abnormalities of gait and mobility (R26.89);Repeated falls (R29.6);Pain Pain - Right/Left: Right Pain - part of body: Knee   Activity Tolerance Patient tolerated treatment well   Patient Left in chair;with call  bell/phone within reach   Nurse Communication Mobility status        Time: 3832-9191 OT Time Calculation (min): 23 min  Charges: OT General Charges $OT Visit: 1 Visit OT Treatments $Self Care/Home Management : 23-37 mins  Joeseph Amor OTR/L  Acute Rehab Services  [phone] office number [phone] pager number   Joeseph Amor 05/03/2021, 12:45 PM

## 2021-05-03 NOTE — Discharge Summary (Signed)
Physician Discharge Summary  Vincent MCALEXANDER EGB:[id number] DOB: 11-08-1933 DOA: 04/20/2021  PCP: [name], MD  Admit date: 04/20/2021 Discharge date: 05/03/2021  Time spent: 35 minutes  Recommendations for Outpatient Follow-up:  Heart failure TOC clinic on 2/24 Follow-up with Dr. Debara Pickett on 2/22, please check BMP and potassium at follow-up   Discharge Diagnoses:  Principal Problem:   Acute on chronic combined systolic and diastolic CHF (congestive heart failure) ([place]) AKI on CKD 3a Hypokalemia   Persistent atrial fibrillation (HCC)   Acute renal failure superimposed on stage 3a chronic kidney disease (Vincent Allen)   Diabetes mellitus with chronic kidney disease (Vincent Allen)   Essential hypertension   Class 1 obesity   Malnutrition of moderate degree Symptomatic bradycardia status post [employer] PPM  Discharge Condition: Stable  Diet recommendation: Diabetic, low-sodium  Filed Weights   05/01/21 0008 05/02/21 0359 05/03/21 0500  Weight: 95.7 kg 94.7 kg 92.7 kg    History of present illness:  87/M with chronic systolic and diastolic CHF, EF 37%, persistent A-fib, V paced, type 2 diabetes mellitus, hypertension, dyslipidemia, CKD 3B presented with biventricular predominantly right heart failure  Hospital Course:  Acute on chronic combined systolic and diastolic CHF (Vincent Allen) Moderate MR, right heart failure -Last echo with EF 40-45%, moderate MR -Underwent paracentesis on 2/4, 5 L drained -Diuresed with high-dose IV Lasix and milrinone -Now significantly improved he is 21 L negative -not on ARB/ANRI due to CKD -He will discharge home on Lasix 80 Mg daily, KCl, Aldactone, Toprol, Jardiance -PT OT eval completed, SNF recommended which patient declined, he will be going home with home health services and increased supervision from his son and daughter   Acute renal failure superimposed on stage 3a chronic kidney disease (Vincent Allen)- (present on admission) Hypokalemia hyponatremia.  Hypomagnesemia  -Baseline creatinine around 1.7-2, creatinine briefly worsened, since then has improved and stabilized back to baseline   Persistent atrial fibrillation (HCC)-(present on admission) -Metoprolol resumed, continue apixaban   Diabetes mellitus with chronic kidney disease (Vincent Allen)- -Started on Jardiance, glipizide resumed at discharge    Malnutrition of moderate degree- On nutritional supplements.    Class 1 obesity- (present on admission) Calculated BMI is 32,2   Cardiac device in situ V-pacing, follow cards recs    Essential hypertension -Stable, see discussion above     Consultants:  Cardiology  Discharge Exam: Vitals:   05/03/21 0758 05/03/21 0826  BP: 117/62 115/61  Pulse: 66 64  Resp: 11 15  Temp: 97.9 F (36.6 C)   SpO2: 98% 98%    General: AAOx3 Cardiovascular: S1-S2, regular rate rhythm Respiratory: Clear  Discharge Instructions   Discharge Instructions     Diet - low sodium heart healthy   Complete by: As directed    Diet Carb Modified   Complete by: As directed    Increase activity slowly   Complete by: As directed       Allergies as of 05/03/2021       Reactions   Atorvastatin    Myalgias / leg pain and weakness   Penicillins Hives, Itching   Has patient had a PCN reaction causing immediate rash, facial/tongue/throat swelling, SOB or lightheadedness with hypotension:Yes Has patient had a PCN reaction causing severe rash involving mucus membranes or skin necrosis:No Has patient had a PCN reaction that required hospitalization:No Has patient had a PCN reaction occurring within the last 10 years:No If all of the above answers are "NO", then may proceed with Cephalosporin use.   Verapamil  Junctional rhythm   Influenza Vaccines Rash   tinnitus        Medication List     STOP taking these medications    amLODipine 5 MG tablet Commonly known as: NORVASC   triamcinolone cream 0.1 % Commonly known as: KENALOG    triamterene-hydrochlorothiazide 75-50 MG tablet Commonly known as: MAXZIDE       TAKE these medications    acetaminophen 500 MG tablet Commonly known as: TYLENOL Take 500 mg by mouth every 6 (six) hours as needed for moderate pain.   Eliquis 2.5 MG Tabs tablet Generic drug: apixaban TAKE 1 TABLET BY MOUTH TWICE A DAY What changed: how much to take   empagliflozin 10 MG Tabs tablet Commonly known as: JARDIANCE Take 1 tablet (10 mg total) by mouth daily. Start taking on: May 04, 2021   furosemide 40 MG tablet Commonly known as: LASIX Take 2 tablets (80 mg total) by mouth daily. What changed:  medication strength See the new instructions.   glipiZIDE 10 MG tablet Commonly known as: GLUCOTROL Take 0.5 tablets (5 mg total) by mouth 2 (two) times daily before a meal.   glucose blood test strip Commonly known as: ONE TOUCH ULTRA TEST USE 1 STRIP 2 TIMES DAILY TO CHECK BLOOD SUGAR DX E08.22, N18.3   Krill Oil 1000 MG Caps Take 1,000 mg by mouth daily.   metoprolol succinate 25 MG 24 hr tablet Commonly known as: TOPROL-XL Take 1 tablet (25 mg total) by mouth daily. What changed: when to take this   multivitamin with minerals Tabs tablet Take 1 tablet by mouth daily.   OVER THE COUNTER MEDICATION Take 1 capsule by mouth 2 (two) times daily. Glucocil   potassium chloride SA 20 MEQ tablet Commonly known as: KLOR-CON M Take 2 tablets (40 mEq total) by mouth daily.   sitaGLIPtin 50 MG tablet Commonly known as: JANUVIA Take 25 mg by mouth 2 (two) times daily.   spironolactone 25 MG tablet Commonly known as: ALDACTONE Take 0.5 tablets (12.5 mg total) by mouth daily. Start taking on: May 04, 2021   Zinc 50 MG Tabs Take 50 mg by mouth daily.       Allergies  Allergen Reactions   Atorvastatin     Myalgias / leg pain and weakness   Penicillins Hives and Itching    Has patient had a PCN reaction causing immediate rash, facial/tongue/throat swelling,  SOB or lightheadedness with hypotension:Yes Has patient had a PCN reaction causing severe rash involving mucus membranes or skin necrosis:No Has patient had a PCN reaction that required hospitalization:No Has patient had a PCN reaction occurring within the last 10 years:No If all of the above answers are "NO", then may proceed with Cephalosporin use.    Verapamil     Junctional rhythm   Influenza Vaccines Rash    tinnitus    Follow-up Information     [facility]. Go to.   Specialty: Cardiology Why: Monday, February 24 @ 9:00 AM for [facility] clinic within [place].  Bring all medications with you.  underground garage code: [id number] located at [employer], off [address] [employer]. Sport and exercise psychologist information: [address] [phone] [place], [place] Follow up.   Specialty: Home Health Services Why: HHRN,HHPT Contact information: [address] [place] [address] [phone]  The results of significant diagnostics from this hospitalization (including imaging, microbiology, ancillary and laboratory) are listed below for reference.    Significant Diagnostic Studies: DG Chest 1 View  Result Date: 04/25/2021 CLINICAL DATA:  Shortness of breath EXAM: CHEST  1 VIEW COMPARISON:  04/21/2021 FINDINGS: Left pacer remains in place, unchanged. Cardiomegaly. Left base linear densities, likely atelectasis. No overt edema or effusions. Right PICC line tip is in the SVC, unchanged. IMPRESSION: Cardiomegaly. Left base atelectasis. Electronically Signed   By: Rolm Baptise M.D.   On: 04/25/2021 19:04   DG Chest 2 View  Result Date: 04/20/2021 CLINICAL DATA:  Shortness of breath. EXAM: CHEST - 2 VIEW COMPARISON:  Chest x-rays dated 04/02/2021 and 09/21/2016. FINDINGS: Study is hypoinspiratory with crowding of the perihilar and bibasilar  bronchovascular markings. Given the slightly low lung volumes, lungs are clear. No confluent opacity to suggest a developing pneumonia. No pleural effusion or pneumothorax is seen. Stable cardiomegaly. LEFT chest wall pacemaker/ICD apparatus in place. Osseous structures about the chest are unremarkable. IMPRESSION: Low lung volumes. No active cardiopulmonary disease. No evidence of pneumonia or pulmonary edema. Cardiomegaly. Electronically Signed   By: Franki Cabot M.D.   On: 04/20/2021 14:00   US Paracentesis  Result Date: 04/21/2021 INDICATION: Patient with history of heart failure, chronic kidney disease, ascites. Request received for diagnostic and therapeutic paracentesis up to 5 liters. EXAM: ULTRASOUND GUIDED DIAGNOSTIC AND THERAPEUTIC PARACENTESIS MEDICATIONS: 10 mL 1% lidocaine COMPLICATIONS: None immediate. PROCEDURE: Informed written consent was obtained from the patient after a discussion of the risks, benefits and alternatives to treatment. A timeout was performed prior to the initiation of the procedure. Initial ultrasound scanning demonstrates a large amount of ascites within the right lower abdominal quadrant. The right lower abdomen was prepped and draped in the usual sterile fashion. 1% lidocaine was used for local anesthesia. Following this, a 19 gauge, 10-cm, Yueh catheter was introduced. An ultrasound image was saved for documentation purposes. The paracentesis was performed. The catheter was removed and a dressing was applied. The patient tolerated the procedure well without immediate post procedural complication. Patient received post-procedure intravenous albumin; see nursing notes for details. FINDINGS: A total of approximately 5 liters of yellow fluid was removed. Samples were sent to the laboratory as requested by the clinical team. IMPRESSION: Successful ultrasound-guided diagnostic and therapeutic paracentesis yielding 5 liters of peritoneal fluid. Read by: Rowe Kenzie, PA-C  Electronically Signed   By: Sandi Mariscal M.D.   On: 04/21/2021 12:35   DG CHEST PORT 1 VIEW  Result Date: 04/21/2021 CLINICAL DATA:  Placement of PICC line EXAM: PORTABLE CHEST 1 VIEW COMPARISON:  04/20/2021 FINDINGS: Transverse diameter of heart is increased. Central pulmonary vessels are prominent. There is increase in interstitial markings in the parahilar regions and lower lung fields, possibly suggesting mild interstitial edema. There is no focal pulmonary consolidation. Costophrenic angles are clear. There is no pneumothorax. Pacemaker battery is seen in the left infraclavicular region. There is interval placement of PICC line through the right upper extremity with its tip in the region of superior vena cava. Postsurgical changes are noted in the right shoulder. IMPRESSION: Cardiomegaly. Central pulmonary vessels are more prominent suggesting CHF. No new focal infiltrates are seen. Tip of PICC line is seen in the course of superior vena cava. Electronically Signed   By: Elmer Picker M.D.   On: 04/21/2021 09:52   ECHOCARDIOGRAM COMPLETE  Result Date: 04/22/2021    ECHOCARDIOGRAM REPORT   Patient Name:   Vincent Allen  Daron Offer Date of Exam: 04/22/2021 Medical Rec #:  [id number]         Height:       72.0 in Accession #:    [id number]        Weight:       230.3 lb Date of Birth:  07/29/1933         BSA:          2.262 m Patient Age:    88 years          BP:           120/56 mmHg Patient Gender: M                 HR:           61 bpm. Exam Location:  Inpatient Procedure: 2D Echo, Cardiac Doppler, Color Doppler and Intracardiac            Opacification Agent Indications:    CHF-Acute Diastolic  History:        Patient has prior history of Echocardiogram examinations, most                 recent 08/18/2020. Pacemaker, Arrythmias:Atrial Fibrillation and                 Bradycardia; Risk Factors:Hypertension, Diabetes and                 Dyslipidemia. CKD. DOE.  Sonographer:    Clayton Lefort RDCS (AE) Referring Phys:  [id number] [facility]  Sonographer Comments: Suboptimal apical window. IMPRESSIONS  1. Windows are limited. Challenging to fully assess wall motion. LVEF approximately 40-45%. Left ventricular ejection fraction, by estimation, is 40 to 45%. The left ventricle has mildly decreased function. Left ventricular diastolic parameters were normal.  2. Right ventricular systolic function is mildly reduced. The right ventricular size is normal. There is moderately elevated pulmonary artery systolic pressure.  3. The mitral valve is grossly normal. No evidence of mitral valve regurgitation.  4. Tricuspid valve regurgitation is mild to moderate.  5. Aortic valve regurgitation is not visualized. Aortic valve sclerosis/calcification is present, without any evidence of aortic stenosis.  6. The inferior vena cava IVC not well visualized. Comparison(s): No significant change from prior study. FINDINGS  Left Ventricle: Windows are limited. Challenging to fully assess wall motion. LVEF approximately 40-45%. Left ventricular ejection fraction, by estimation, is 40 to 45%. The left ventricle has mildly decreased function. Definity contrast agent was given  IV to delineate the left ventricular endocardial borders. The left ventricular internal cavity size was normal in size. There is no left ventricular hypertrophy. Left ventricular diastolic parameters were normal. Right Ventricle: The right ventricular size is normal. No increase in right ventricular wall thickness. Right ventricular systolic function is mildly reduced. There is moderately elevated pulmonary artery systolic pressure. The tricuspid regurgitant velocity is 3.09 m/s, and with an assumed right atrial pressure of 15 mmHg, the estimated right ventricular systolic pressure is 30.8 mmHg. Left Atrium: Left atrial size was normal in size. Right Atrium: Right atrial size was normal in size. Pericardium: There is no evidence of pericardial effusion. Mitral Valve: The mitral valve  is grossly normal. No evidence of mitral valve regurgitation. Tricuspid Valve: The tricuspid valve is grossly normal. Tricuspid valve regurgitation is mild to moderate. Aortic Valve: Aortic valve regurgitation is not visualized. Aortic valve sclerosis/calcification is present, without any evidence of aortic stenosis. Aortic valve mean gradient measures 3.0 mmHg. Aortic valve peak gradient measures  5.1 mmHg. Aortic valve  area, by VTI measures 2.64 cm. Pulmonic Valve: The pulmonic valve was not well visualized. Pulmonic valve regurgitation is not visualized. Aorta: The aortic root and ascending aorta are structurally normal, with no evidence of dilitation. Venous: The inferior vena cava IVC not well visualized. IAS/Shunts: The interatrial septum was not well visualized. Additional Comments: A device lead is visualized.  LEFT VENTRICLE PLAX 2D LVIDd:         4.90 cm LVIDs:         3.60 cm LV PW:         1.10 cm LV IVS:        1.30 cm LVOT diam:     2.10 cm LV SV:         54 LV SV Index:   24 LVOT Area:     3.46 cm  RIGHT VENTRICLE            IVC RV Basal diam:  3.70 cm    IVC diam: 2.70 cm RV Mid diam:    3.70 cm RV S prime:     9.46 cm/s TAPSE (M-mode): 1.4 cm LEFT ATRIUM           Index        RIGHT ATRIUM           Index LA diam:      4.20 cm 1.86 cm/m   RA Area:     20.80 cm LA Vol (A2C): 47.6 ml 21.04 ml/m  RA Volume:   59.90 ml  26.48 ml/m LA Vol (A4C): 67.7 ml 29.93 ml/m  AORTIC VALVE AV Area (Vmax):    2.60 cm AV Area (Vmean):   2.34 cm AV Area (VTI):     2.64 cm AV Vmax:           113.00 cm/s AV Vmean:          79.100 cm/s AV VTI:            0.203 m AV Peak Grad:      5.1 mmHg AV Mean Grad:      3.0 mmHg LVOT Vmax:         84.90 cm/s LVOT Vmean:        53.500 cm/s LVOT VTI:          0.155 m LVOT/AV VTI ratio: 0.76  AORTA Ao Root diam: 3.80 cm Ao Asc diam:  3.40 cm TRICUSPID VALVE TR Peak grad:   38.2 mmHg TR Vmax:        309.00 cm/s  SHUNTS Systemic VTI:  0.16 m Systemic Diam: 2.10 cm Phineas Inches  Electronically signed by Phineas Inches Signature Date/Time: 04/22/2021/4:23:20 PM    Final    [place] ASCITES (ABDOMEN LIMITED)  Result Date: 04/20/2021 CLINICAL DATA:  Ascites, abdominal distention. EXAM: LIMITED ABDOMEN ULTRASOUND FOR ASCITES TECHNIQUE: Limited ultrasound survey for ascites was performed in all four abdominal quadrants. COMPARISON:  09/01/2020. FINDINGS: Moderate to large ascites is noted in all 4 quadrants, the largest pocket in the right upper quadrant measuring up to 15.8 cm. IMPRESSION: Moderate to large ascites. Electronically Signed   By: Brett Fairy M.D.   On: 04/20/2021 22:44   [place] EKG SITE RITE  Result Date: 04/21/2021 If Site Rite image not attached, placement could not be confirmed due to current cardiac rhythm.   Microbiology: No results found for this or any previous visit (from the past 240 hour(s)).   Labs: Basic Metabolic Panel: Recent Labs  Lab 04/28/21  0400 04/29/21 0500 04/29/21 1704 04/30/21 0139 05/01/21 0500 05/01/21 1635 05/02/21 0539 05/03/21 0520  NA 132*   < > 135 133* 134* 134* 136 135  K 3.1*   < > 3.1* 3.5 2.7* 3.5 3.2* 3.6  CL 96*   < > 92* 92* 91* 90* 92* 93*  CO2 28   < > 32 31 33* 33* 34* 32  GLUCOSE 235*   < > 207* 236* 179* 200* 124* 105*  BUN 68*   < > 65* 64* 61* 59* 57* 50*  CREATININE 2.03*   < > 1.92* 1.98* 1.92* 1.90* 1.91* 1.64*  CALCIUM 8.4*   < > 8.7* 8.7* 9.2 8.9 8.7* 8.6*  MG 1.8  --  1.7 2.0  --   --  1.7  --    < > = values in this interval not displayed.   Liver Function Tests: No results for input(s): AST, ALT, ALKPHOS, BILITOT, PROT, ALBUMIN in the last 168 hours. No results for input(s): LIPASE, AMYLASE in the last 168 hours. No results for input(s): AMMONIA in the last 168 hours. CBC: Recent Labs  Lab 05/03/21 0520  WBC 6.7  HGB 11.4*  HCT 35.3*  MCV 85.3  PLT 221   Cardiac Enzymes: No results for input(s): CKTOTAL, CKMB, CKMBINDEX, TROPONINI in the last 168 hours. BNP: BNP (last 3 results) Recent Labs     04/20/21 1348  BNP 1,460.7*    ProBNP (last 3 results) No results for input(s): PROBNP in the last 8760 hours.  CBG: Recent Labs  Lab 05/02/21 1208 05/02/21 1651 05/02/21 2119 05/03/21 0546 05/03/21 1036  GLUCAP 184* 159* 130* 119* 183*       Signed:  Domenic Polite MD.  [facility] 05/03/2021, 11:56 AM

## 2021-05-03 NOTE — Progress Notes (Addendum)
Progress Note  Patient Name: Vincent Allen Date of Encounter: 05/03/2021  [facility] Cardiologist: Pixie Casino, MD   Subjective   Denies any chest pain, SOB, palpitations, abdominal bloating. Feels like the swelling in his legs has improved. Continues to have an occasional cough, nonproductive.   Inpatient Medications    Scheduled Meds:  apixaban  2.5 mg Oral BID   bisacodyl  5 mg Oral QHS   Chlorhexidine Gluconate Cloth  6 each Topical Daily   empagliflozin  10 mg Oral Daily   feeding supplement  237 mL Oral BID BM   guaiFENesin  600 mg Oral BID   insulin aspart  0-15 Units Subcutaneous TID WC   insulin glargine-yfgn  5 Units Subcutaneous Daily   multivitamin with minerals  1 tablet Oral Daily   potassium chloride  40 mEq Oral BID   sodium chloride flush  10-40 mL Intracatheter Q12H   Continuous Infusions:  PRN Meds: acetaminophen **OR** acetaminophen   Vital Signs    Vitals:   05/02/21 1120 05/02/21 2011 05/03/21 0314 05/03/21 0500  BP: (!) 118/59 115/66 116/69   Pulse: 61 61 66   Resp: (!) 21 16 16    Temp: 98.2 F (36.8 C) 98.1 F (36.7 C) 97.9 F (36.6 C)   TempSrc: Oral Oral Oral   SpO2: 97% 97% 97%   Weight:    92.7 kg  Height:        Intake/Output Summary (Last 24 hours) at 05/03/2021 0743 Last data filed at 05/03/2021 0500 Gross per 24 hour  Intake 850 ml  Output 1100 ml  Net -250 ml   Last 3 Weights 05/03/2021 05/02/2021 05/01/2021  Weight (lbs) 204 lb 5.9 oz 208 lb 12.4 oz 211 lb  Weight (kg) 92.7 kg 94.7 kg 95.709 kg      Telemetry    Ventricular pacing, HR in the 60s  - Personally Reviewed  ECG    No new tracings - Personally Reviewed  Physical Exam   GEN: No acute distress.   Neck: No JVD Cardiac: Irregular rate and rhythm, no murmurs, rubs, or gallops. Widely split S2 Respiratory: Clear to auscultation bilaterally. GI: Soft, nontender, non-distended  MS: Trace pedal edema; No deformity. Legs wrapped in bandages   Neuro:  Nonfocal  Psych: Normal affect   Labs    High Sensitivity Troponin:   Recent Labs  Lab 04/20/21 1254 04/20/21 1348  TROPONINIHS 83* 93*     Chemistry Recent Labs  Lab 04/29/21 1704 04/30/21 0139 05/01/21 0500 05/01/21 1635 05/02/21 0539 05/03/21 0520  NA 135 133*   < > 134* 136 135  K 3.1* 3.5   < > 3.5 3.2* 3.6  CL 92* 92*   < > 90* 92* 93*  CO2 32 31   < > 33* 34* 32  GLUCOSE 207* 236*   < > 200* 124* 105*  BUN 65* 64*   < > 59* 57* 50*  CREATININE 1.92* 1.98*   < > 1.90* 1.91* 1.64*  CALCIUM 8.7* 8.7*   < > 8.9 8.7* 8.6*  MG 1.7 2.0  --   --  1.7  --   GFRNONAA 34* 32*   < > 34* 34* 41*  ANIONGAP 11 10   < > 11 10 10    < > = values in this interval not displayed.    Lipids No results for input(s): CHOL, TRIG, HDL, LABVLDL, LDLCALC, CHOLHDL in the last 168 hours.  Hematology Recent Labs  Lab 05/03/21  0520  WBC 6.7  RBC 4.14*  HGB 11.4*  HCT 35.3*  MCV 85.3  MCH 27.5  MCHC 32.3  RDW 15.0  PLT 221   Thyroid No results for input(s): TSH, FREET4 in the last 168 hours.  BNPNo results for input(s): BNP, PROBNP in the last 168 hours.  DDimer No results for input(s): DDIMER in the last 168 hours.   Radiology    No results found.  Cardiac Studies   ECHO 04/22/2021:   1. Windows are limited. Challenging to fully assess wall motion. LVEF  approximately 40-45%. Left ventricular ejection fraction, by estimation,  is 40 to 45%. The left ventricle has mildly decreased function. Left  ventricular diastolic parameters were  normal.   2. Right ventricular systolic function is mildly reduced. The right  ventricular size is normal. There is moderately elevated pulmonary artery  systolic pressure.   3. The mitral valve is grossly normal. No evidence of mitral valve  regurgitation.   4. Tricuspid valve regurgitation is mild to moderate.   5. Aortic valve regurgitation is not visualized. Aortic valve  sclerosis/calcification is present, without any evidence  of aortic  stenosis.   6. The inferior vena cava IVC not well visualized.   Comparison(s): No significant change from prior study.   Patient Profile     88 y.o. male with mildly depressed left ventricular systolic function, persistent atrial fibrillation with 100% ventricular paced rhythm, hypertension, hyperlipidemia, type 2 diabetes mellitus, CKD stage IIIb admitted with acute exacerbation of heart failure with biventricular manifestation  Assessment & Plan    Acute on chronic combined systolic and diastolic CHF  - Echo this admission showed LVEF 87-56%, normal diastolic parameters  - Lasix has been on hold for the past 2 days as K was supplemented. Patient put out 1.1 L urine in the past 24 hours. Spontaneous diuresis is probably related to improved RV output as patient more euvolemic. Currently net -21.9 L fluids since admission, weight down to 204 Lbs (down from 246.25 lbs on admission) - K 3.6 this AM. Ok to restart oral diuretic. Patient was taking 40mg  lasix daily prior to admission. Plan to discharge patient on 80mg  with addition dosing PRN for weight gain >3 lb/day, >5 lb/week or whenever he reaches 210 lb. - Continue jardiance 10 mg daily  - Patient was on metoprolol prior to admission, held in the setting of decompensated heart failure. HR well controlled in the 60s, but ventricular paced 99.7% of the time. With predominantly paced rhythm, BB would not significantly affect HR. Will restart BB at this time.  - With patient's hypokalemia on lasix and fairly stable creatinine, could add spironolactone. Will start spironolactone 12.5 mg.  - Patient has a heart vascular TOC appointment on 2/20 and an appointment with Dr. Debara Pickett on 2/22. Will need BMP at follow up to monitor K and kidney function    AKI on Stage IIIa CKD  - Creatinine stable at 1.64 (baseline around 1.7-2) - Continue to avoid nephrotoxic medications    Hypokalemia  - K has been low with diuresis, improved this AM at  3.6.    Persistent Atrial Fibrillation  - On metoprolol at home, this was held in the setting of decompensated heart failure. Predominantly ventricular paced rhythm per telemetry. BB restarted this AM  - Continue eliquis for anticoagulation (on 2.5 mg due to age, renal function)    Symptomatic Bradycardia s/p Medtronic PPM  - May be a good candidate for upgrade to CRT as patient  has nearly 100% ventricular pacing (99.7% v-paced)     For questions or updates, please contact [place] Please consult [email or web address] for contact info under        Signed, Margie Billet, PA-C  05/03/2021, 7:43 AM     I have seen and examined the patient along with Margie Billet, PA-C .  I have reviewed the chart, notes and new data.  I agree with PA/NP's note.  Key new complaints: no dyspnea at rest or walking Key examination changes: still has pedal edema Key new findings / data: creatinine improved, K normal  PLAN: Will add low dose spironolactone and restart loop diuretic. If OK w PT, ready to go home. Otherwise plan for SNF.  Sanda Klein, MD, [place] [phone] 05/03/2021, 8:55 AM

## 2021-05-03 NOTE — Progress Notes (Signed)
Patient verbalize permission to speak to daughter and go over discharge instructions wit RN. RN called and spoke to [place], daughter and provided discharge instructions, medication changes, and follow up appointments made for the patient. Patient's daughter verbalize understanding. PT in the room providing therapy with the patient. PICC team en route to remove PICC.

## 2021-05-04 ENCOUNTER — Telehealth: Payer: Self-pay

## 2021-05-04 DIAGNOSIS — I4819 Other persistent atrial fibrillation: Secondary | ICD-10-CM | POA: Diagnosis not present

## 2021-05-04 DIAGNOSIS — E876 Hypokalemia: Secondary | ICD-10-CM | POA: Diagnosis not present

## 2021-05-04 DIAGNOSIS — Z9181 History of falling: Secondary | ICD-10-CM | POA: Diagnosis not present

## 2021-05-04 DIAGNOSIS — H9190 Unspecified hearing loss, unspecified ear: Secondary | ICD-10-CM | POA: Diagnosis not present

## 2021-05-04 DIAGNOSIS — Z950 Presence of cardiac pacemaker: Secondary | ICD-10-CM | POA: Diagnosis not present

## 2021-05-04 DIAGNOSIS — I130 Hypertensive heart and chronic kidney disease with heart failure and stage 1 through stage 4 chronic kidney disease, or unspecified chronic kidney disease: Secondary | ICD-10-CM | POA: Diagnosis not present

## 2021-05-04 DIAGNOSIS — Z6827 Body mass index (BMI) 27.0-27.9, adult: Secondary | ICD-10-CM | POA: Diagnosis not present

## 2021-05-04 DIAGNOSIS — I509 Heart failure, unspecified: Secondary | ICD-10-CM | POA: Diagnosis not present

## 2021-05-04 DIAGNOSIS — I50810 Right heart failure, unspecified: Secondary | ICD-10-CM | POA: Diagnosis not present

## 2021-05-04 DIAGNOSIS — Z87448 Personal history of other diseases of urinary system: Secondary | ICD-10-CM | POA: Diagnosis not present

## 2021-05-04 DIAGNOSIS — Z7901 Long term (current) use of anticoagulants: Secondary | ICD-10-CM | POA: Diagnosis not present

## 2021-05-04 DIAGNOSIS — E440 Moderate protein-calorie malnutrition: Secondary | ICD-10-CM | POA: Diagnosis not present

## 2021-05-04 DIAGNOSIS — N1832 Chronic kidney disease, stage 3b: Secondary | ICD-10-CM | POA: Diagnosis not present

## 2021-05-04 DIAGNOSIS — Z9889 Other specified postprocedural states: Secondary | ICD-10-CM | POA: Diagnosis not present

## 2021-05-04 DIAGNOSIS — E1122 Type 2 diabetes mellitus with diabetic chronic kidney disease: Secondary | ICD-10-CM | POA: Diagnosis not present

## 2021-05-04 DIAGNOSIS — I5043 Acute on chronic combined systolic (congestive) and diastolic (congestive) heart failure: Secondary | ICD-10-CM | POA: Diagnosis not present

## 2021-05-04 DIAGNOSIS — K219 Gastro-esophageal reflux disease without esophagitis: Secondary | ICD-10-CM | POA: Diagnosis not present

## 2021-05-04 DIAGNOSIS — Z8639 Personal history of other endocrine, nutritional and metabolic disease: Secondary | ICD-10-CM | POA: Diagnosis not present

## 2021-05-04 DIAGNOSIS — Z7984 Long term (current) use of oral hypoglycemic drugs: Secondary | ICD-10-CM | POA: Diagnosis not present

## 2021-05-04 DIAGNOSIS — E785 Hyperlipidemia, unspecified: Secondary | ICD-10-CM | POA: Diagnosis not present

## 2021-05-04 DIAGNOSIS — E559 Vitamin D deficiency, unspecified: Secondary | ICD-10-CM | POA: Diagnosis not present

## 2021-05-04 DIAGNOSIS — Z9049 Acquired absence of other specified parts of digestive tract: Secondary | ICD-10-CM | POA: Diagnosis not present

## 2021-05-04 NOTE — Telephone Encounter (Signed)
Transition Care Management Follow-up Telephone Call Date of discharge and from where: 05/03/2021-[facility] How have you been since you were released from the hospital? Feeling a little better Any questions or concerns? No  Items Reviewed: Did the pt receive and understand the discharge instructions provided? Yes  Medications obtained and verified? Yes  Other? Yes  Any new allergies since your discharge? No  Dietary orders reviewed? Yes Do you have support at home? Yes   Home Care and Equipment/Supplies: Were home health services ordered? yes If so, what is the name of the agency? [facility]  Has the agency set up a time to come to the patient's home? no Were any new equipment or medical supplies ordered?  No What is the name of the medical supply agency? N/a Were you able to get the supplies/equipment? not applicable Do you have any questions related to the use of the equipment or supplies? N/a  Functional Questionnaire: (I = Independent and D = Dependent) ADLs: I with assistance  Bathing/Dressing- I with assistance  Meal Prep- D  Eating- I  Maintaining continence- I  Transferring/Ambulation- I with [place]  Managing Meds- D  Follow up appointments reviewed:  PCP Hospital f/u appt confirmed? Yes  Scheduled to see Caleen Jobs on 05/14/2021 @ 11:20. [facility] f/u appt confirmed? Yes  Scheduled to see Dr. Debara Pickett on 05/09/2021 @ 2:15. Are transportation arrangements needed? No  If their condition worsens, is the pt aware to call PCP or go to the Emergency Dept.? Yes Was the patient provided with contact information for the PCP's office or ED? Yes Was to pt encouraged to call back with questions or concerns? Yes

## 2021-05-07 ENCOUNTER — Encounter (HOSPITAL_COMMUNITY): Payer: PPO

## 2021-05-08 ENCOUNTER — Telehealth: Payer: Self-pay | Admitting: Family Medicine

## 2021-05-08 NOTE — Telephone Encounter (Signed)
Caller/Agency: [place] Number: [phone] Requesting OT/PT/Skilled Nursing/Social Work/Speech Therapy: OT Frequency: 1 x 5

## 2021-05-08 NOTE — Telephone Encounter (Signed)
Contacted [facility] and gave approval for verbal order. OT/PT/skilled nursing/social work/speech therapy. 1 x5

## 2021-05-09 ENCOUNTER — Other Ambulatory Visit: Payer: Self-pay

## 2021-05-09 ENCOUNTER — Encounter: Payer: Self-pay | Admitting: Internal Medicine

## 2021-05-09 ENCOUNTER — Ambulatory Visit: Payer: PPO | Admitting: Internal Medicine

## 2021-05-09 VITALS — BP 105/62 | HR 67 | Ht 72.0 in | Wt 201.7 lb

## 2021-05-09 DIAGNOSIS — I4819 Other persistent atrial fibrillation: Secondary | ICD-10-CM

## 2021-05-09 DIAGNOSIS — I50811 Acute right heart failure: Secondary | ICD-10-CM

## 2021-05-09 DIAGNOSIS — R351 Nocturia: Secondary | ICD-10-CM | POA: Diagnosis not present

## 2021-05-09 DIAGNOSIS — I5041 Acute combined systolic (congestive) and diastolic (congestive) heart failure: Secondary | ICD-10-CM

## 2021-05-09 DIAGNOSIS — I447 Left bundle-branch block, unspecified: Secondary | ICD-10-CM | POA: Diagnosis not present

## 2021-05-09 DIAGNOSIS — N401 Enlarged prostate with lower urinary tract symptoms: Secondary | ICD-10-CM | POA: Diagnosis not present

## 2021-05-09 DIAGNOSIS — Z950 Presence of cardiac pacemaker: Secondary | ICD-10-CM | POA: Diagnosis not present

## 2021-05-09 DIAGNOSIS — Z79899 Other long term (current) drug therapy: Secondary | ICD-10-CM | POA: Diagnosis not present

## 2021-05-09 MED ORDER — TAMSULOSIN HCL 0.4 MG PO CAPS
0.4000 mg | ORAL_CAPSULE | Freq: Every day | ORAL | 3 refills | Status: DC
Start: 2021-05-09 — End: 2022-03-07

## 2021-05-09 NOTE — Patient Instructions (Signed)
Medication Instructions:  START flomax 0.4mg  daily at nighttime   *If you need a refill on your cardiac medications before your next appointment, please call your pharmacy*   Lab Work: BMET, magnesium today   If you have labs (blood work) drawn today and your tests are completely normal, you will receive your results only by: [place] (if you have MyChart) OR A paper copy in the mail If you have any lab test that is abnormal or we need to change your treatment, we will call you to review the results.   Testing/Procedures: NONE   Follow-Up: At [facility], you and your health needs are our priority.  As part of our continuing mission to provide you with exceptional heart care, we have created designated Provider Care Teams.  These Care Teams include your primary Cardiologist (physician) and Advanced Practice Providers (APPs -  Physician Assistants and Nurse Practitioners) who all work together to provide you with the care you need, when you need it.  We recommend signing up for the patient portal called "MyChart".  Sign up information is provided on this After Visit Summary.  MyChart is used to connect with patients for Virtual Visits (Telemedicine).  Patients are able to view lab/test results, encounter notes, upcoming appointments, etc.  Non-urgent messages can be sent to your provider as well.   To learn more about what you can do with MyChart, go to [email or web address].    Your next appointment:   3 month(s)  The format for your next appointment:   In Person  Provider:   Pixie Casino, MD {    Other Instructions You have been referred to Dr. Allegra Lai (cardiac electrophysiologist)  Located at [place] [address] [employer]

## 2021-05-09 NOTE — Progress Notes (Signed)
OFFICE FOLLOW-UP NOTE  Chief Complaint:  Follow-up pacemaker  Primary Care Physician: [name], MD  HPI:  Vincent Allen is a 88 y.o. male with a past medial history significant for atrial fibrillation/flutter with slow ventricular response. Seen recently in the hospital after mechanical fall in the home. He has a history of paroxysmal atrial fibrillation in the past and was seen by Dr. Wynonia Lawman that was not on any anticoagulation. His CHADSVASC score is 4. He was started on Eliquis 2.5 mg twice a day due to age greater than 80 creatinine greater than 1.5. An echocardiogram was performed which showed normal systolic function, diastolic dysfunction moderate left atrial enlargement. I arranged for outpatient monitoring which shows persistent atrial fibrillation/flutter with controlled ventricular response and at times bradycardia but no clear long pauses or any other indications for pacemaker at this time. He reports being fatigued. He's been an active problem for more than 60 years and is interested in getting back to [place] feels that his lack of energy is keeping him back from that.  09/12/2016  Vincent Allen returns for follow-up today. He underwent recent a cardioversion by Dr. Sallyanne Kuster. This was successful and he reported feeling a little better with heart rates in sinus in the 50s for about a week, however then started to feel more fatigued. He's not been on any AV nodal blocking medications. He reports compliance with Eliquis. EKG today shows atrial fibrillation with a marked bradycardic response of 40. Notes that his heart rate will not necessarily increase with any exertion.  10/24/2016  Vincent Allen returns today for follow-up. He underwent successful pacemaker placement by Dr. Caryl Comes about 3 weeks ago. This was a Medtronic dual-chamber pacemaker. Subsequent pacemaker follow-up shows 100% atrial fibrillation. Pacemaker appears to be working properly. The wound appears to be well-healed. He is  restricted his arm movement now and is interested in more activity. He recently had lab work in advance to upcoming visit with Dr. Randel Pigg. Total cholesterol is 110, Travis was 167, HDL 37 and LDL 39. Metabolic profile was unremarkable with potassium however was slightly low at 3.4 and creatinine 1.45. Blood pressure is elevated today. I reviewed home readings indicating his blood pressure ranges from the about the 170Y to 174B systolic over 44H diastolic. Heart rate is been mostly in the 70s and 80s. There is no evidence of rapid atrial fibrillation or bradycardia. He reports improvement in his energy level.  04/28/2017  Vincent Allen returns today for follow-up.  He has had significant improvement in his symptoms after placement of pacemaker.  He still gets a little short of breath but is not been quite as active.  Weight is somewhat better now to 19 and was as high as 225 in November.  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  Blood pressure is well controlled today.  Has had follow-up with Dr. Caryl Comes who feels the pacemaker is working appropriately.  05/11/2018  Vincent Allen is seen today in annual follow-up.  Overall he is doing well.  He is not been working on his airplane is much recently.  He says that he is worried about falls.  He did sell off a lot of his projects as well as his angina recently.  He denies any chest pain or shortness of breath.  He said no syncopal episodes.  Recent pacemaker interrogation shows a normal pacing and he is ventricular paced today.  He has persistent if not permanent A. fib.  He is not  have any bleeding problems on Eliquis.  Blood pressures been well controlled.  Lab work recently and April 23, 2018 showed total cholesterol 171, HDL 34, triglycerides of 182 and LDL of 100.  His goal LDL should be slightly lower than that I would suspect if we want to be aggressive probably less than 100.  He is not listed to be on statin therapy but previously was on  atorvastatin 20 mg.  He is not sure if he is taking it but again I had him listed on that a year ago.  I would advise if he is come off the medication to restart that otherwise we may need to consider increasing it further.  05/09/2021  Vincent Allen is seen today in follow-up.  He was recently hospitalized and I cared for him for about a week of that hospitalization that actually went more than 2 weeks.  He had symptoms of right heart failure but also had some LV dysfunction with EF around 45%.  He underwent paracentesis with about 5 L of fluid removed.  This was transudative and look more like heart failure.  Subsequently was placed on milrinone and had good color oximetry panels.  He diuresed from 244 pounds down to 201 pounds today which is his new her dry weight.  On discharge his Lasix was increased to 40 mg twice daily.  He is currently taking that in the morning.  He continues to have issues with nocturia and has had BPH for years but is not on treatment for that.  I suspect this is the reason for his nocturnal overflow incontinence.  Blood pressure is good today.  He says he feels well and is ambulating without difficulty.  Oxygen saturation has improved.  He will need repeat labs including metabolic profile and magnesium because of issues with hypokalemia during the hospitalization.  He has a transition of care follow-up with heart failure on February 24.  It was also noted that he had 100% ventricular pacing on recent remote checks.  This could be an etiology of his reduced LV function.  He was seen by Dr. Sallyanne Kuster, who recommended evaluation for CRT-P therapy.  I would then refer Vincent Allen to our electrophysiologist.  PMHx:  Past Medical History:  Diagnosis Date   Arthritis    Bradycardia    Cataract    Chronic kidney disease stage III (GFR 30-59 ml/min) 05/03/2012   Colon polyps    Complication of anesthesia    emesis   Diabetes mellitus    GERD (gastroesophageal reflux disease)    HOH (hard of  hearing)    HTN (hypertension) 01/13/2013   Hyperlipidemia    Hypertension    Pacemaker 12/2016   Persistent atrial fibrillation ([place])    Presence of permanent cardiac pacemaker 09/20/2016   Symptomatic bradycardia    Vitamin D deficiency 07/17/2015    Past Surgical History:  Procedure Laterality Date   ANKLE ARTHROPLASTY     CARDIOVERSION N/A 08/20/2016   Procedure: CARDIOVERSION;  Surgeon: Sanda Klein, MD;  Location: [facility];  Service: Cardiovascular;  Laterality: N/A;   CATARACT EXTRACTION  2010, 2014   CATARACT EXTRACTION  02/28/14   CHOLECYSTECTOMY     CHOLECYSTECTOMY N/A 05/03/2012   Procedure: LAPAROSCOPIC CHOLECYSTECTOMY WITH INTRAOPERATIVE CHOLANGIOGRAM;  Surgeon: [name], MD;  Location: [facility];  Service: General;  Laterality: N/A;   INSERT / REPLACE / REMOVE PACEMAKER  09/20/2016   PACEMAKER IMPLANT N/A 09/20/2016   Procedure: Pacemaker Implant;  Surgeon: Deboraha Sprang,  MD;  Location: [facility];  Service: Cardiovascular;  Laterality: N/A;   ROTATOR CUFF REPAIR     SHOULDER SURGERY     TONSILLECTOMY      FAMHx:  Family History  Problem Relation Age of Onset   Cancer Mother        colon, breast, pancreas, skin cancer   Heart disease Father        heart valve replaced   Stroke Father    Diabetes Father    Cancer Paternal Grandmother        colon   Obesity Son    Heart disease Son        bradycardia    SOCHx:   reports that he has never smoked. He has never used smokeless tobacco. He reports current alcohol use of about 1.0 standard drink per week. He reports that he does not use drugs.  ALLERGIES:  Allergies  Allergen Reactions   Atorvastatin     Myalgias / leg pain and weakness   Penicillins Hives and Itching    Has patient had a PCN reaction causing immediate rash, facial/tongue/throat swelling, SOB or lightheadedness with hypotension:Yes Has patient had a PCN reaction causing severe rash involving mucus membranes or skin  necrosis:No Has patient had a PCN reaction that required hospitalization:No Has patient had a PCN reaction occurring within the last 10 years:No If all of the above answers are "NO", then may proceed with Cephalosporin use.    Verapamil     Junctional rhythm   Influenza Vaccines Rash    tinnitus    ROS: Pertinent items noted in HPI and remainder of comprehensive ROS otherwise negative.  HOME MEDS: Current Outpatient Medications on File Prior to Visit  Medication Sig Dispense Refill   acetaminophen (TYLENOL) 500 MG tablet Take 500 mg by mouth every 6 (six) hours as needed for moderate pain.     ELIQUIS 2.5 MG TABS tablet TAKE 1 TABLET BY MOUTH TWICE A DAY 60 tablet 5   empagliflozin (JARDIANCE) 10 MG TABS tablet Take 1 tablet (10 mg total) by mouth daily. 30 tablet 0   furosemide (LASIX) 40 MG tablet Take 2 tablets (80 mg total) by mouth daily. 60 tablet 1   glipiZIDE (GLUCOTROL) 10 MG tablet Take 0.5 tablets (5 mg total) by mouth 2 (two) times daily before a meal. 360 tablet 1   glucose blood (ONE TOUCH ULTRA TEST) test strip USE 1 STRIP 2 TIMES DAILY TO CHECK BLOOD SUGAR DX E08.22, N18.3 300 each 1   Krill Oil 1000 MG CAPS Take 1,000 mg by mouth daily.     metoprolol succinate (TOPROL-XL) 25 MG 24 hr tablet Take 1 tablet (25 mg total) by mouth daily.     Multiple Vitamin (MULTIVITAMIN WITH MINERALS) TABS tablet Take 1 tablet by mouth daily.     OVER THE COUNTER MEDICATION Take 1 capsule by mouth 2 (two) times daily. Glucocil     potassium chloride SA (KLOR-CON M) 20 MEQ tablet Take 2 tablets (40 mEq total) by mouth daily. 60 tablet 0   sitaGLIPtin (JANUVIA) 50 MG tablet Take 25 mg by mouth 2 (two) times daily. 30 tablet 3   spironolactone (ALDACTONE) 25 MG tablet Take 0.5 tablets (12.5 mg total) by mouth daily. 30 tablet 0   Zinc 50 MG TABS Take 50 mg by mouth daily.     No current facility-administered medications on file prior to visit.    LABS/IMAGING: No results found for  this or any  previous visit (from the past 48 hour(s)). No results found.  LIPID PANEL:    Component Value Date/Time   CHOL 125 04/02/2021 1456   TRIG 84.0 04/02/2021 1456   HDL 31.30 (L) 04/02/2021 1456   CHOLHDL 4 04/02/2021 1456   VLDL 16.8 04/02/2021 1456   LDLCALC 77 04/02/2021 1456   LDLCALC 101 (H) 12/16/2019 1543   LDLDIRECT 65.0 11/03/2018 1038     WEIGHTS: Wt Readings from Last 3 Encounters:  05/09/21 201 lb 11.2 oz (91.5 kg)  05/03/21 204 lb 5.9 oz (92.7 kg)  01/25/21 237 lb (107.5 kg)    VITALS: BP 105/62    Pulse 67    Ht 6' (1.829 m)    Wt 201 lb 11.2 oz (91.5 kg)    SpO2 99%    BMI 27.36 kg/m   EXAM: General appearance: alert, no distress, and pale Neck: no carotid bruit, no JVD, and thyroid not enlarged, symmetric, no tenderness/mass/nodules Lungs: clear to auscultation bilaterally Heart: regular rate and rhythm, S1, S2 normal, no murmur, click, rub or gallop and Healing pacemaker site left upper chest Abdomen: soft, non-tender; bowel sounds normal; no masses,  no organomegaly and mildly protuberant Extremities: edema 1+ bilateral LE Pulses: 2+ and symmetric Skin: Skin color, texture, turgor normal. 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   Vincent Allen was recently hospitalized for several weeks for acute combined systolic and diastolic heart failure with predominant right heart failure symptoms.  He underwent paracentesis and diuresis with milrinone.  He was diuresed 43 pounds and weighs 201 today.  His breathing has  improved and he is ambulating at home.  He has 100% ventricular pacing and may have contributed to his cardiomyopathy.  He also has an underlying left bundle branch block.  Will refer him to electrophysiology for evaluation of CRT-P therapy.  He will continue on higher dose Lasix 80 mg in the morning.  He has had some nocturia which is probably overflow incontinence.  He has a history of BPH.  Start tamsulosin 0.4 mg nightly.  Will check magnesium and metabolic profile today.  He has heart failure transition clinic follow-up in 2 days and can follow-up with me otherwise in 3 months or sooner as necessary.  Vincent Casino, MD, [facility], [place] Director of the Advanced Lipid Disorders &  Cardiovascular Risk Reduction Clinic Diplomate of the American Board of Clinical Lipidology Attending Cardiologist  Direct Dial: [phone]   Fax: [phone]  Website:  [email or web address] 05/09/2021, 2:41 PM

## 2021-05-10 ENCOUNTER — Ambulatory Visit (INDEPENDENT_AMBULATORY_CARE_PROVIDER_SITE_OTHER): Payer: PPO | Admitting: Pharmacist

## 2021-05-10 ENCOUNTER — Other Ambulatory Visit: Payer: Self-pay | Admitting: *Deleted

## 2021-05-10 DIAGNOSIS — I5032 Chronic diastolic (congestive) heart failure: Secondary | ICD-10-CM

## 2021-05-10 DIAGNOSIS — E0822 Diabetes mellitus due to underlying condition with diabetic chronic kidney disease: Secondary | ICD-10-CM

## 2021-05-10 DIAGNOSIS — N183 Chronic kidney disease, stage 3 unspecified: Secondary | ICD-10-CM

## 2021-05-10 DIAGNOSIS — I10 Essential (primary) hypertension: Secondary | ICD-10-CM

## 2021-05-10 LAB — BASIC METABOLIC PANEL
BUN/Creatinine Ratio: 21 (ref 10–24)
BUN: 47 mg/dL — ABNORMAL HIGH (ref 8–27)
CO2: 24 mmol/L (ref 20–29)
Calcium: 9.4 mg/dL (ref 8.6–10.2)
Chloride: 96 mmol/L (ref 96–106)
Creatinine, Ser: 2.23 mg/dL — ABNORMAL HIGH (ref 0.76–1.27)
Glucose: 127 mg/dL — ABNORMAL HIGH (ref 70–99)
Potassium: 5.1 mmol/L (ref 3.5–5.2)
Sodium: 137 mmol/L (ref 134–144)
eGFR: 28 mL/min/{1.73_m2} — ABNORMAL LOW (ref >59)

## 2021-05-10 LAB — MAGNESIUM: Magnesium: 2 mg/dL (ref 1.6–2.3)

## 2021-05-10 MED ORDER — SITAGLIPTIN PHOSPHATE 25 MG PO TABS: 25.0000 mg | ORAL_TABLET | Freq: Every day | ORAL | 2 refills | Status: DC

## 2021-05-10 NOTE — Chronic Care Management (AMB) (Signed)
° ° °Chronic Care Management °Pharmacy Note ° °05/10/2021 °Name:  Vincent Allen MRN:  [id number] DOB:  05/28/1933 ° °Summary:  °Lowered dose of Januvia to take 50mg - 0.5 tablet = 25mg daily based on recent decrease in renal function.  °Continue Jardiance 10mg daily for CKD, CHF and DM.  °Reminded to check weight and blood glucose daily; Check blood pressure 2 to 3 times per week.  °Notify our office you if you have reading blood glucose <80 or >250. °Provided applications for Jardiance and Januvia medication assistance program - patient to sign in office 05/14/2021 ° °Subjective: °Vincent Allen is an 88 y.o. year old male who is a primary patient of [name], [name], MD.  The CCM team was consulted for assistance with disease management and care coordination needs.   ° °Engaged with patient by telephone for follow up visit in response to provider referral for pharmacy case management and/or care coordination services.  ° °Consent to Services:  °The patient was given information about Chronic Care Management services, agreed to services, and gave verbal consent prior to initiation of services.  Please see initial visit note for detailed documentation.  ° °Patient Care Team: °[name], [name], MD as PCP - General (Family Medicine) °Hilty, Kenneth C, MD as PCP - Cardiology (Cardiology) °Groat, [place], MD as Consulting Physician (Ophthalmology) °Jones, Daniel, MD as Consulting Physician (Dermatology) °Nelson, Katarina H, MD as Consulting Physician (Cardiology) °Whitfield, Peter W, MD as Consulting Physician (Orthopedic Surgery) °Regal, Norman S, DPM as Consulting Physician (Podiatry) °Eckard, Tammy, RPH-CPP (Pharmacist) ° °Recent Office Visits:  °03/23/2021 - Fam Med (Dr [name]) F/U diabetes; patient experience 2 hypoglycemia events recently. Lowered dose of glipizide to 5mg twice a day. Labs checked - showed worsening renal function. Stopped triamterene/HCTZ. rechecked renal funciton in 1 week. Referral to  nephrology. °01/25/2021 - Fam Med (Dr [name]) Video Visit for right leg pain and swelling / HTN. Recommended compression hose; increase furosemide to 2 tablets for 3 days, then go back to 1 tablet daily. Ordered ultrasound to r/o DVT.  Ultrasound showed no DVT in right leg.  ° °Recent Consults:  °05/10/2021 - Cardio (Dr Hilty) F/U afib and CHF. Started tamsulosin 0.4mg nightly for BPH. Labs checked.  °04/20/2021 - Nephrology ([facility]) no notes available. Patient sent to hospital due to suspected CHF exacerbation. °02/26/2021 - Podiatry (Dr Galaway) Annual Diabetic foot exam °02/01/2021 - Orth Surgery (Dr Whitfield) Seen for right knee pain. Noted to have osteoarthritis of right knee and popliteal cyst per ultrasound. No intervention at this time as patient denied any symptoms. ° °Hospital visits: ° 04/20/2021 to 05/03/2021 Hospitalization at [facility]  for CHF.  °Medications stated during hospitalization: Jardiance 10mg daily, potsssium 20 mEq -  2 tablets daily and spironolactone 12.5mg daily. °Medications stopped during hospitalization: silodosin 4mg (Rapaflo), amlodipine 5mg, calcium carbonate, finasterine 5mg, trimaterene-HCTZ 75-50mg, vitamin D °Medications changed during hospitalization: metoprolol decreased from 25mg twice a day to 25mg once a day. Furosemide increased form 20 to 40mg daiy to 80mg daily.  ° °Objective: ° °Lab Results  °Component Value Date  ° CREATININE 2.23 (H) 05/09/2021  ° CREATININE 1.64 (H) 05/03/2021  ° CREATININE 1.91 (H) 05/02/2021  ° ° °Lab Results  °Component Value Date  ° HGBA1C 5.8 04/02/2021  ° °Last diabetic Eye exam:  °Lab Results  °Component Value Date/Time  ° HMDIABEYEEXA No Retinopathy 05/26/2019 12:00 AM  °  °Last diabetic Foot exam: No results found for: HMDIABFOOTEX  ° °   °  Component Value Date/Time  ° CHOL 125 04/02/2021 1456  ° TRIG 84.0 04/02/2021 1456  ° HDL 31.30 (L) 04/02/2021 1456  ° CHOLHDL 4 04/02/2021 1456  ° VLDL 16.8 04/02/2021 1456  ° LDLCALC  77 04/02/2021 1456  ° LDLCALC 101 (H) 12/16/2019 1543  ° LDLDIRECT 65.0 11/03/2018 1038  ° ° °Hepatic Function Latest Ref Rng & Units 04/20/2021 04/09/2021 04/02/2021  °Total Protein 6.5 - 8.1 g/dL 6.5 6.5 6.9  °Albumin 3.5 - 5.0 g/dL 2.6(L) 3.3(L) 3.6  °AST 15 - 41 U/L 23 22 28  °ALT 0 - 44 U/L 17 18 16  °Alk Phosphatase 38 - 126 U/L 65 84 77  °Total Bilirubin 0.3 - 1.2 mg/dL 1.4(H) 1.6(H) 1.8(H)  °Bilirubin, Direct 0.0 - 0.2 mg/dL 0.5(H) - -  ° ° °Lab Results  °Component Value Date/Time  ° TSH 4.58 04/02/2021 02:56 PM  ° TSH 4.55 11/30/2020 04:00 PM  ° FREET4 1.01 08/11/2020 04:25 PM  ° ° °CBC Latest Ref Rng & Units 05/03/2021 04/25/2021 04/22/2021  °WBC 4.0 - 10.5 K/uL 6.7 6.8 8.7  °Hemoglobin 13.0 - 17.0 g/dL 11.4(L) 9.1(L) 10.7(L)  °Hematocrit 39.0 - 52.0 % 35.3(L) 28.1(L) 31.8(L)  °Platelets 150 - 400 K/uL 221 178 177  ° ° °Lab Results  °Component Value Date/Time  ° VD25OH 76.09 08/10/2020 03:13 PM  ° VD25OH 74.75 04/20/2020 11:06 AM  ° ° °Clinical ASCVD: Yes  °The ASCVD Risk score (Arnett DK, et al., 2019) failed to calculate for the following reasons: °  The 2019 ASCVD risk score is only valid for ages 40 to 79   ° ° °Social History  ° °Tobacco Use  °Smoking Status Never  °Smokeless Tobacco Never  ° °BP Readings from Last 3 Encounters:  °05/09/21 105/62  °05/03/21 115/61  °04/02/21 122/66  ° °Pulse Readings from Last 3 Encounters:  °05/09/21 67  °05/03/21 64  °04/02/21 62  ° °Wt Readings from Last 3 Encounters:  °05/09/21 201 lb 11.2 oz (91.5 kg)  °05/03/21 204 lb 5.9 oz (92.7 kg)  °01/25/21 237 lb (107.5 kg)  ° ° °Assessment: Review of patient past medical history, allergies, medications, health status, including review of consultants reports, laboratory and other test data, was performed as part of comprehensive evaluation and provision of chronic care management services.  ° °SDOH:  (Social Determinants of Health) assessments and interventions performed:  ° ° ° ° °CCM Care Plan ° °Allergies  °Allergen Reactions  °  Atorvastatin   °  Myalgias / leg pain and weakness  ° Penicillins Hives and Itching  °  Has patient had a PCN reaction causing immediate rash, facial/tongue/throat swelling, SOB or lightheadedness with hypotension:Yes °Has patient had a PCN reaction causing severe rash involving mucus membranes or skin necrosis:No °Has patient had a PCN reaction that required hospitalization:No °Has patient had a PCN reaction occurring within the last 10 years:No °If all of the above answers are "NO", then may proceed with Cephalosporin use. °  ° Verapamil   °  Junctional rhythm  ° Influenza Vaccines Rash  °  tinnitus  ° ° °Medications Reviewed Today   ° ° Reviewed by Eckard, Tammy, RPH-CPP (Pharmacist) on 05/10/21 at 1033  Med List Status: <None>  ° °Medication Order Taking? Sig Documenting Provider Last Dose Status Informant  °acetaminophen (TYLENOL) 500 MG tablet [id number] Yes Take 500 mg by mouth every 6 (six) hours as needed for moderate pain. [provider] Taking Active Child  °ELIQUIS 2.5 MG TABS tablet [id number] Yes TAKE   1 TABLET BY MOUTH TWICE A DAY [name], [name], MD Taking Active   °empagliflozin (JARDIANCE) 10 MG TABS tablet [id number] Yes Take 1 tablet (10 mg total) by mouth daily. Joseph, Preetha, MD Taking Active   °furosemide (LASIX) 40 MG tablet [id number] Yes Take 2 tablets (80 mg total) by mouth daily. Joseph, Preetha, MD Taking Active   °glipiZIDE (GLUCOTROL) 10 MG tablet [id number] Yes Take 0.5 tablets (5 mg total) by mouth 2 (two) times daily before a meal. [name], [name], MD Taking Active Child  °glucose blood (ONE TOUCH ULTRA TEST) test strip [id number] Yes USE 1 STRIP 2 TIMES DAILY TO CHECK BLOOD SUGAR DX E08.22, N18.3 [name], [name], MD Taking Active Child  °Krill Oil 1000 MG CAPS [id number] Yes Take 1,000 mg by mouth daily. [provider] Taking Active Child  °metoprolol succinate (TOPROL-XL) 25 MG 24 hr tablet [id number] Yes Take 1 tablet (25 mg total) by mouth daily. Joseph, Preetha, MD  Taking Active   °Multiple Vitamin (MULTIVITAMIN WITH MINERALS) TABS tablet [id number] Yes Take 1 tablet by mouth daily. [provider] Taking Active Child  °OVER THE COUNTER MEDICATION [id number] Yes Take 1 capsule by mouth 2 (two) times daily. Glucocil [provider] Taking Active Child  °potassium chloride SA (KLOR-CON M) 20 MEQ tablet [id number] Yes Take 2 tablets (40 mEq total) by mouth daily. Joseph, Preetha, MD Taking Active   °sitaGLIPtin (JANUVIA) 50 MG tablet [id number] Yes Take 25 mg by mouth 2 (two) times daily. [name], [name], MD Taking Active Child  °spironolactone (ALDACTONE) 25 MG tablet [id number] Yes Take 0.5 tablets (12.5 mg total) by mouth daily. Joseph, Preetha, MD Taking Active   °tamsulosin (FLOMAX) 0.4 MG CAPS capsule [id number] Yes Take 1 capsule (0.4 mg total) by mouth at bedtime. Hilty, Kenneth C, MD Taking Active   °Zinc 50 MG TABS [id number] Yes Take 50 mg by mouth daily. [provider] Taking Active Child  ° °  °  ° °  ° ° °Patient Active Problem List  ° Diagnosis Date Noted  ° Malnutrition of moderate degree 04/27/2021  ° Class 1 obesity 04/26/2021  ° Acute on chronic diastolic CHF (congestive heart failure) (HCC) 04/21/2021  ° Acute on chronic combined systolic and diastolic CHF (congestive heart failure) (HCC) 04/20/2021  ° Acute renal failure superimposed on stage 3a chronic kidney disease (HCC) 04/20/2021  ° Acute right-sided heart failure (HCC)   ° Contusion 04/02/2021  ° SOB (shortness of breath) 04/02/2021  ° Unilateral primary osteoarthritis, right knee 02/01/2021  ° Fatty liver 12/01/2020  ° Pedal edema 08/14/2020  ° Knee pain, bilateral 09/20/2019  ° Morbid obesity (HCC) 09/14/2019  ° Urinary frequency 11/06/2016  ° S/P placement of cardiac pacemaker 10/24/2016  ° Chronic diastolic CHF (congestive heart failure) (HCC) 10/24/2016  ° Cardiac device in situ   ° Debility 08/15/2016  ° Persistent atrial fibrillation (HCC) 07/09/2016  ° Vitamin D deficiency  07/17/2015  ° Preventative health care 07/17/2015  ° Lumbago 12/02/2014  ° Pain of toe of left foot 03/14/2014  ° Essential hypertension 01/13/2013  ° Insomnia 06/27/2011  ° Osteopenia 09/02/2010  ° Family history of colon cancer 09/02/2010  ° Diabetes mellitus with chronic kidney disease (HCC)   ° Hypertensive heart disease   ° Hyperlipidemia LDL goal <70   ° CTS (carpal tunnel syndrome) 08/28/2010  ° ° °Immunization History  °Administered Date(s) Administered  ° Pneumococcal Conjugate-13 03/23/2015  ° Pneumococcal Polysaccharide-23 06/27/2011  ° Tdap 05/19/2014  ° ° °Conditions   to be addressed/monitored: °Atrial Fibrillation, CHF, CAD, HTN, HLD, DMII and osteopenia; CKD - stage 3b ° °Care Plan : General Pharmacy (Adult)  °Updates made by Eckard, Tammy, RPH-CPP since 05/10/2021 12:00 AM  °  ° °Problem: Provide education, support and care coordination for medication therapy and chronic conditions   °Priority: High  °Onset Date: 07/13/2020  °Note:   °CARE PLAN ENTRY ° ° °Current Barriers:  °Decreasing renal function - evaluate for need to adjust medications °Not checking blood pressure or weight daily (improved)  °Needs assistance with cost of medications - reaches coverage gap °  °Hypertension / High Blood pressure ° BP goal <140/90 - Controlled °Current regimen:  °Metoprolol succinate 25mg daily (dose decreased 04/2021) °Previous meds tried: triamterene-hydrochlorothiazide (stopped due to increase in Scr), amlodipine - stopped due to edema °Checks blood pressure at home sometimes: 125 to 135 / 60's °Interventions:  °Recommended continue current antihypertensive therapy °Reviewed blood pressure goal; recommended check blood pressure 2 to 3 times per week and record. ° °Chronic Heart Failure (HFrEF) / chronic venous insufficiency:  °Recent exacerbation requiring hospitalization for 2 weeks;  °Goal: prevention of CHF exacerbations and minimize symptoms of CHF. °Current Regimen:  °Furosemide 40mg - take 2 tablets = 80mg  daily each morning (dose increased during recent hospitalization)  °Metoprolol succinate ER 25mg once a day (dose decreased during recent hospitalization)  °Jardiance 10mg daily  (started during recent hospitalization)  °Spironolactone 25mg - take 0.5 tablet = 12.5mg daily (started during recent hospitalization) °Potassium supplement 20 mEq - take 2 tablets = 40 mEq daily °Last EF = 40-45%  °Lost 40 lbs during hospitalization (new dry weight 201 lbs)  °Patient is weighing every day now.  °Denies shortness of breath; states he feel much better.   °Interventions:  °Discussed signs and symptoms of CHF exacerbation - weight gain, SOB, abdominal fullness, swelling in legs or abdomen, Fatigue and weakness, changes in ability to perform usual activities, persistent cough or wheezing with white or pink blood-tinged mucus, nausea and lack of appetite °Continue to weigh daily - report weight gain of more than 3 lbs in 24 hours or 5 lbs in 1 week. °Provided patient assistance program application for Jardiance (patient to sign in office 05/14/2021) ° °Hyperlipidemia ° LDL goal < 70 - not currently at goal (last LDL was 86)  °Tried atorvastatin in past - caused leg pain and weakness °Current regimen:  °Diet and exercise management   °Krill Oil 1000mg daily °Interventions: (addressed at previous visit)  °Consider retrial of alternative statin like  rosuvastatin 5mg daily - patient declined °Recommend limiting intake of food high in saturated fat / avoid fried foods; increase intake of vegetables °Continue Krill Oil ° °Diabetes °Lab Results  °Component Value Date  ° HGBA1C 5.8 04/02/2021  °. A1c is at goal °Current regimen:  °Januvia 25mg twice a day °Jardiance 10mg daily (though at current Scr might not be getting much blood glucose lowering from Jardiance) °Glipizide 10mg - take 0.5 tablets = 5mg with morning meal and with evening meal °Checks BG daily: recent readings - 127, 87, 97,, 100 °Patient received Januvia from patient  assistance program in 2022. Patient reports he received 2023 application for patient assistance program for Januvia and he mailed back to the patient assistance program program however at last visit I spoke with representative at [employer] and they had not received application as of 04/17/2021. °Recent serum creatinine was elevated 2.23 and eGFR was 28mL/min (05/09/2021) °Interventions: °Decreased dose of Januvia to 25mg   daily based on recent decreased in eGFR. (May consider stopping Januvia if blood glucose continues to be at goal).  Continue to check blood glucose / sugar 1 to 2 times per day. Notify PCP office if experience blood glucose < 80 Consider less aggressive A1c goal of <7.5% given patient's age and CVD history.  Reviewed home blood glucose readings and reviewed goals  Fasting blood glucose goal (before meals) = 80 to 130 Blood glucose goal after a meal = less than 180   Atrial fibrillation  Current regimen:  Eliquis 2.102m twice daily in morning and with dinner Metoprolol succinate 269monce a day Patient did reach coverage gap in 2022 but not 3% out of pocket spend to apply for [employer] Eliquis patient assistance program for 2022. Benefits have reset for 2023.  Interventions: (addressed at previous visit)  Maintain current atrial fibrillation medication regimen Will monitor medication cost in 2023 and apply for patient assistance program for Eliquis if patient qualifies.    Decreased Kidney Function:  Lab Results  Component Value Date   NA 137 05/09/2021   K 5.1 05/09/2021   CO2 24 05/09/2021   GLUCOSE 127 (H) 05/09/2021   BUN 47 (H) 05/09/2021   CREATININE 2.23 (H) 05/09/2021   CALCIUM 9.4 05/09/2021   EGFR 28 (L) 05/09/2021   GFRNONAA 41 (L) 05/03/2021  Current regimen:  Jardiance 1072maily Interventions: Reviewed medications to see if any need adjustment based on current renal function.  Lowered dose of januvia to 71m61mily Avoid over the counter pain  relievers like Aleve / naproxen; Motrin / ibuprofen. If need medication for pain relief, headaches or fever - Tylenol / acetaminophen 500mg5mto 2 tablets every 8 hours is safer option.   Osteopenia:  Last DEXA 12/2019 Lowest T-Score = -2.4 at left femur neck ;  Right femur neck = -1.4 History of fracture foot and shoulder. Per patient sustained from fall from standing position  Fall Risk  01/01/2021 07/13/2020 10/06/2019 09/14/2019 02/10/2019  Falls in the past year? 0 0 0 0 0  Comment - - - - Emmi Telephone Survey: data to providers prior to load  Number falls in past yr: 0 0 0 - -  Injury with Fall? 0 0 0 - -  Comment - - - - -  Risk for fall due to : No Fall Risks History of fall(s);Impaired balance/gait - - -  Follow up Falls prevention discussed Falls prevention discussed;Falls evaluation completed Education provided;Falls prevention discussed - -  Interventions: (addressed at previous visit) Continue to take calcium + D daily Discussed fall prevention Continue to use cane for ambulation assistance Consider recheck DEXA in 2023  BPH / nocturia:  Not controlled Patient previously took Rapaflo (not effective) and finasteride (patient unsure why stopped) Current therapy:  Tamsulosin 0.4mg d72my Has only had 1 dose of tamsulosin last night but patient reports he believes that it helped a little.  Son asks about Myrbetriq Interventions:  Discussed giving tamsulosin more time to take effect. If patient still has frequent nocturia in future can consider Myrbetriq or retrial of finasteride.   Medication management Pharmacist Clinical Goal(s): Over the next 90 days, patient will work with PharmD and providers to achieve optimal medication adherence Current pharmacy: [facility] Interventions Comprehensive medication review performed. Continue current medication management strategy Sent in updated prescription of lower dose of Januvia 71mg d10m to [facility] - asked to profile for now Provided  applications for patient assistance program for Januvia and  Jardiance - patient will be in office 05/14/2021 and will sign then.  ° °Please see past updates related to this goal by clicking on the "Past Updates" button in the selected goal  °  ° ° °Medication Assistance: approved for patient assistance for Januvia in June 2022 through 03/17/2021.  °Left application for Jardiance and Januvia at front desk for patient to sign when in office 05/14/2021.  °Patient did not meet 3% out of pocket spend for Eliquis patient assistance program in 2022 but will follow in 2023.  ° ° °Patient's preferred pharmacy is: ° °[id number] - [place], [place] - [address] °[address] °[place] [address] °Phone: [phone] Fax: [phone] ° ° ° °Follow Up:  Patient agrees to Care Plan and Follow-up. ° ° ° Telephone follow up appointment with care management team member scheduled for:  2 to 3 weeks ° °Tammy Eckard, PharmD °Clinical Pharmacist °[facility] °[facility] ° ° ° ° °

## 2021-05-10 NOTE — Progress Notes (Signed)
HEART & VASCULAR TRANSITION OF CARE CONSULT NOTE    Referring Physician: Dr. Sallyanne Kuster  Primary Care: [name], MD Primary Cardiologist: Pixie Casino, MD Electrophysiologist: Dr. Caryl Comes   HPI: Referred to clinic by Dr. Sallyanne Kuster for heart failure consultation.   88 y/o male w/ h/o chronic afib, bradycardia and sinus node dysfunction s/p MDT PPM implant in 2018 by Dr. Caryl Comes. Prior to PPM, had echo 4/18 that showed normal LVEF, 55%. RV mildly dilated w/ normal systolic function.   Echo done 6/22 showed drop in EF down to 40-45%, RV normal.   Recently admitted 2/23 w/ a/c heart failure w/ marked fluid overload. Echo EF 40-45%, RV mild-mod reduced. Mild-mod TR. Device interrogation showed 99.7% RV pacing.   He was placed on IV Lasix but had had initial poor response due to low output and AKI,  required milrinone w/ subsequent improvement in diuresis and SCr. He diuresed over 40 lb. Also required ultrasound-guided diagnostic and therapeutic paracentesis yielding 5 liters of peritoneal fluid, transudative c/w HF. SCr improved from peak of 2.4>>1.9. Transitioned to PO Lasix, Jardiance + spiro. D/c wt 203 lb.  He had f/u w/ Dr. Debara Pickett earlier this week on 2/22. Wt was stable at 201 lb. Labs showed bump in Scr back up from 1.9>>2.2, K upper limits of normal at 5.1. Na ok at 137. Spiro and KCl discontinued.   Presents to [facility] clinic today for f/u. Here w/ daughter. In [place]. Lives home alone and ambulates w/ walker. Fairly independent w/ most ALDs but daughter and son help as well. Feels fairly decent since discharge. Wt stable at 204 lb today (201 lb on home scale). Still w/ some residual LE on exam but no abdominal distention/edema. Wearing TED hoses. Reports good urinary response w/ lasix. Has been fluid restricting. SBPs ok. No orthostatic symptoms.     Cardiac Testing   ECHO 04/22/2021:   1. Windows are limited. Challenging to fully assess wall motion. LVEF  approximately 40-45%. Left  ventricular ejection fraction, by estimation,  is 40 to 45%. The left ventricle has mildly decreased function. Left  ventricular diastolic parameters were  normal.   2. Right ventricular systolic function is mildly reduced. The right  ventricular size is normal. There is moderately elevated pulmonary artery  systolic pressure.   3. The mitral valve is grossly normal. No evidence of mitral valve  regurgitation.   4. Tricuspid valve regurgitation is mild to moderate.   5. Aortic valve regurgitation is not visualized. Aortic valve  sclerosis/calcification is present, without any evidence of aortic  stenosis.   6. The inferior vena cava IVC not well visualized.   Comparison(s): No significant change from prior study.    Review of Systems: [y] = yes, [ ]  = no   General: Weight gain [ ] ; Weight loss [ ] ; Anorexia [ ] ; Fatigue [ ] ; Fever [ ] ; Chills [ ] ; Weakness [ ]   Cardiac: Chest pain/pressure [ ] ; Resting SOB [ ] ; Exertional SOB [ ] ; Orthopnea [ ] ; Pedal Edema [Y ]; Palpitations [ ] ; Syncope [ ] ; Presyncope [ ] ; Paroxysmal nocturnal dyspnea[ ]   Pulmonary: Cough [ ] ; Wheezing[ ] ; Hemoptysis[ ] ; Sputum [ ] ; Snoring [ ]   GI: Vomiting[ ] ; Dysphagia[ ] ; Melena[ ] ; Hematochezia [ ] ; Heartburn[ ] ; Abdominal pain [ ] ; Constipation [ ] ; Diarrhea [ ] ; BRBPR [ ]   GU: Hematuria[ ] ; Dysuria [ ] ; Nocturia[ ]   Vascular: Pain in legs with walking [ ] ; Pain in feet with lying  flat [ ] ; Non-healing sores [ ] ; Stroke [ ] ; TIA [ ] ; Slurred speech [ ] ;  Neuro: Headaches[ ] ; Vertigo[ ] ; Seizures[ ] ; Paresthesias[ ] ;Blurred vision [ ] ; Diplopia [ ] ; Vision changes [ ]   Ortho/Skin: Arthritis [ ] ; Joint pain [ ] ; Muscle pain [ ] ; Joint swelling [ ] ; Back Pain [ ] ; Rash [ ]   Psych: Depression[ ] ; Anxiety[ ]   Heme: Bleeding problems [ ] ; Clotting disorders [ ] ; Anemia [ ]   Endocrine: Diabetes [ ] ; Thyroid dysfunction[ ]    Past Medical History:  Diagnosis Date   Arthritis    Bradycardia    Cataract    Chronic  kidney disease stage III (GFR 30-59 ml/min) 05/03/2012   Colon polyps    Complication of anesthesia    emesis   Diabetes mellitus    GERD (gastroesophageal reflux disease)    HOH (hard of hearing)    HTN (hypertension) 01/13/2013   Hyperlipidemia    Hypertension    Pacemaker 12/2016   Persistent atrial fibrillation (HCC)    Presence of permanent cardiac pacemaker 09/20/2016   Symptomatic bradycardia    Vitamin D deficiency 07/17/2015    Current Outpatient Medications  Medication Sig Dispense Refill   acetaminophen (TYLENOL) 500 MG tablet Take 500 mg by mouth every 6 (six) hours as needed for moderate pain.     ELIQUIS 2.5 MG TABS tablet TAKE 1 TABLET BY MOUTH TWICE A DAY 60 tablet 5   empagliflozin (JARDIANCE) 10 MG TABS tablet Take 1 tablet (10 mg total) by mouth daily. 30 tablet 0   furosemide (LASIX) 40 MG tablet Take 2 tablets (80 mg total) by mouth daily. 60 tablet 1   glipiZIDE (GLUCOTROL) 10 MG tablet Take 0.5 tablets (5 mg total) by mouth 2 (two) times daily before a meal. 360 tablet 1   glucose blood (ONE TOUCH ULTRA TEST) test strip USE 1 STRIP 2 TIMES DAILY TO CHECK BLOOD SUGAR DX E08.22, N18.3 300 each 1   Krill Oil 1000 MG CAPS Take 1,000 mg by mouth daily.     metoprolol succinate (TOPROL-XL) 25 MG 24 hr tablet Take 1 tablet (25 mg total) by mouth daily.     Multiple Vitamin (MULTIVITAMIN WITH MINERALS) TABS tablet Take 1 tablet by mouth daily.     OVER THE COUNTER MEDICATION Take 1 capsule by mouth 2 (two) times daily. Glucocil     sitaGLIPtin (JANUVIA) 25 MG tablet Take 1 tablet (25 mg total) by mouth daily. 30 tablet 2   tamsulosin (FLOMAX) 0.4 MG CAPS capsule Take 1 capsule (0.4 mg total) by mouth at bedtime. 90 capsule 3   Zinc 50 MG TABS Take 50 mg by mouth daily.     No current facility-administered medications for this encounter.    Allergies  Allergen Reactions   Atorvastatin     Myalgias / leg pain and weakness   Penicillins Hives and Itching    Has  patient had a PCN reaction causing immediate rash, facial/tongue/throat swelling, SOB or lightheadedness with hypotension:Yes Has patient had a PCN reaction causing severe rash involving mucus membranes or skin necrosis:No Has patient had a PCN reaction that required hospitalization:No Has patient had a PCN reaction occurring within the last 10 years:No If all of the above answers are "NO", then may proceed with Cephalosporin use.    Verapamil     Junctional rhythm   Influenza Vaccines Rash    tinnitus      Social History   Socioeconomic History  Marital status: Widowed    Spouse name: Not on file   Number of children: Not on file   Years of education: Not on file   Highest education level: Not on file  Occupational History   Not on file  Tobacco Use   Smoking status: Never   Smokeless tobacco: Never  Vaping Use   Vaping Use: Never used  Substance and Sexual Activity   Alcohol use: Yes    Alcohol/week: 1.0 standard drink    Types: 1 Glasses of wine per week   Drug use: No   Sexual activity: Not on file    Comment: lives alone , no major dietary restrictions, 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.  Other Topics Concern   Not on file  Social History Narrative   Not on file   Social Determinants of Health   Financial Resource Strain: Medium Risk   Difficulty of Paying Living Expenses: Somewhat hard  Food Insecurity: No Food Insecurity   Worried About Running Out of Food in the Last Year: Never true   Ran Out of Food in the Last Year: Never true  Transportation Needs: No Transportation Needs   Lack of Transportation (Medical): No   Lack of Transportation (Non-Medical): No  Physical Activity: Insufficiently Active   Days of Exercise per Week: 7 days   Minutes of Exercise per Session: 10 min  Stress: No Stress Concern Present   Feeling of Stress : Not at all  Social Connections: Socially Isolated   Frequency of Communication with Friends and  Family: More than three times a week   Frequency of Social Gatherings with Friends and Family: Three times a week   Attends Religious Services: Never   Active Member of Clubs or Organizations: No   Attends Archivist Meetings: Never   Marital Status: Widowed  Human resources officer Violence: Not At Risk   Fear of Current or Ex-Partner: No   Emotionally Abused: No   Physically Abused: No   Sexually Abused: No      Family History  Problem Relation Age of Onset   Cancer Mother        colon, breast, pancreas, skin cancer   Heart disease Father        heart valve replaced   Stroke Father    Diabetes Father    Cancer Paternal Grandmother        colon   Obesity Son    Heart disease Son        bradycardia    Vitals:   05/11/21 0911  BP: (!) 118/58  Pulse: 82  SpO2: 100%  Weight: 92.8 kg    PHYSICAL EXAM: General:  Well appearing elderly WM in [place]. No respiratory difficulty HEENT: normal Neck: supple. JVD 6 cm. Carotids 2+ bilat; no bruits. No lymphadenopathy or thryomegaly appreciated. Cor: PMI nondisplaced. Irregularly irregular rhythm and rate. 2/6 TR murmur  Lungs: clear Abdomen: soft, nontender, nondistended. No hepatosplenomegaly. No bruits or masses. Good bowel sounds. Extremities: no cyanosis, clubbing, rash, 1+ pretibial edema +TED hoses  Neuro: alert & oriented x 3, cranial nerves grossly intact. moves all 4 extremities w/o difficulty. Affect pleasant.  ECG: Not performed    ASSESSMENT & PLAN:  Chronic Biventricular Heart Failure  - Echo 06/2016 EF 50-55%, RV normal - 09/2016 PPM placed for symptomatic bradycardia - Echo 6/22 Drop in EF 40-45%, RV ok - recent admit w/ a/c CHF w/ marked fluid overload and low output, requiring milrinone to help  w/ diuresis. Echo EF 40-45%, RV mild-mod reduced. Device interrogation showed nearly 100% pacing. Suspect pacing induced CM due to high RV pacing - He has been referred back to EP for upgrade to CRT-P. Has appt  scheduled 3/9 - NYHA Class II-III, confounded by age and deconditioning. No resting dyspnea. Wt stable since discharge. Still w/ mild residual pedal edema but responding well to PO Lasix.  - Continue PO Lasix 80 mg daily. Seems to be keeping fluid off   - Informed that if Lasix stops working to contact Dr. Lysbeth Penner office for transition to Torsemide  - Off Arlyce Harman w/ elevated K and SCr - Continue Jardiance 10 mg daily  - No ARB/ARB/digoxin w/ CKD - discussed continuation of daily wts and fluid restriction  - agree that he would benefit from upgrade to CRT-P  - Dr. Debara Pickett following labs, (BMP checked 2/22, repeat ordered 2/27)   2. Chronic Afib - rate controlled - Eliquis for a/c  3. TachyBrady - s/p PPM, w/ high RV pacing (99%) - subsequent PICM w/ BiV dysfunction  - referring for CRT-P upgrade   4. Recent AKI on CKD Stage IIIb - baseline SCr ~1.9, recent bump to 2.2  - cardiorenal syndrome/ recent low output in setting of BiV dysfunction  - on Jardiance 10 mg  - Arlyce Harman recently discontinued w/ elevated K  - reports good UOP w/ PO Lasix. Not hypotensive in clinic today  - Cardiology following labs, scheduled for f/u BMP on Monday   5. Type 2D  - Jardiance and Januvia  - followed by PCP    NYHA II-III GDMT  Diuretic- Lasix 80 mg daily  BB- Toprol XL 25 mg daily  Ace/ARB/ARNI no CKD  MRA No CKD and borderline high K  SGLT2i Jardiance 10 mg daily     Referred to HFSW (PCP, Medications, Transportation, ETOH Abuse, Drug Abuse, Insurance, Museum/gallery curator ):  No Refer to Pharmacy:  No Refer to Home Health: Yes (already followed by [facility])  Refer to Advanced Heart Failure Clinic: No  Refer to General Cardiology: Yes (Dr. Laban Emperor and Dr. Caryl Comes)   Follow up  w/ Dr. Debara Pickett and Dr. Caryl Comes

## 2021-05-10 NOTE — Patient Instructions (Signed)
Vincent Allen It was a pleasure speaking with you today.  I have attached a summary of our visit today and information about your health goals.  (See below)   If you have any questions or concerns, please feel free to contact me either at the phone number below or with a MyChart message.   Keep up the good work!  Cherre Robins, PharmD Clinical Pharmacist [facility] [phone] (direct line)  [phone] (main office number)  Chronic Care Management CARE PLAN (updated 05/10/2021)    Hypertension / High Blood pressure BP Readings from Last 3 Encounters:  05/09/21 105/62  05/03/21 115/61  04/02/21 122/66   Pharmacist Clinical Goal(s): Over the next 90 days, patient will work with PharmD and providers to maintain BP goal <140/90 Current regimen:  Metoprolol succinate 25mg  daily Interventions: Continue current hypertension medication regimen.  Reviewed blood pressure goal Recommend check blood pressure 2 to 3 times per week and record   Congestive Heart Disease:  Goal: prevention of worsening of heart disease and minimize symptoms of congestive heart diseaes Current Regimen:  Furosemide 40mg  - take 2 tablets = 80mg  daily each morning (dose increased during recent hospitalization)  Metoprolol succinate ER 25mg  once a day (dose decreased during recent hospitalization)  Jardiance 10mg  daily  (started during recent hospitalization)  Spironolactone 25mg  - take 0.5 tablet = 12.5mg  daily (started during recent hospitalization) Potassium supplement 20 mEq - take 2 tablets = 40 mEq daily (started during recent hospitalization)  Interventions:  Discussed signs and symptoms of CHF exacerbation - weight gain, SOB, abdominal fullness, swelling in legs or abdomen, Fatigue and weakness, changes in ability to perform usual activities, persistent cough or wheezing with white or pink blood-tinged mucus, nausea and lack of appetite Continue to weigh daily -  report weight gain of more than 3 lbs in 24 hours or 5 lbs in 1 week. Provided patient assistance program application for Jardiance (patient to sign in office 05/14/2021)  Hyperlipidemia Lab Results  Component Value Date/Time   LDLCALC 77 04/02/2021 02:56 PM   LDLCALC 101 (H) 12/16/2019 03:43 PM   LDLDIRECT 65.0 11/03/2018 10:38 AM   Pharmacist Clinical Goal(s): Over the next 90 days, patient will work with PharmD and providers to achieve LDL goal < 70 Current regimen:  Diet and exercise management   Krill Oil 1000mg  daily Interventions: Discussed past experiences with atorvastatin - caused leg pain and weakness Recommend limiting intake of food high in saturated fat / avoid fried foods; increase intake of vegetables Patient self care activities - Over the next 90 days, patient will: Achieve LDL < 70 Continue Krill Oil  Diabetes Lab Results  Component Value Date/Time   HGBA1C 5.8 04/02/2021 02:56 PM   HGBA1C 6.3 11/30/2020 04:00 PM   Pharmacist Clinical Goal(s): Over the next 90 days, patient will work with PharmD and providers to maintain A1c goal <7.5% Current regimen:  Januvia 50mg  0.5 tablet twice a day Glipizide 10mg  - take 0.5 talbet = 5mg  twice a day with morning and evening meals.  Jardiance 10mg  daily Interventions: Reviewed medication dose and renal funciton  Recommended checking blood glucose / sugar daily Reviewed home blood glucose readings and reviewed goals  Fasting blood glucose goal (before meals) = 80 to 130 Blood glucose goal after a meal = less than 180  Patient self care activities - Over the next 90 days, patient will: Decrease dose of Januvia to 25mg  daily .   Continue to check blood glucose /  sugar 1 to 2 times per day. Notify Dr Frederik Pear office if experience blood glucose < 80 Reviewed home blood glucose readings and reviewed goals  Fasting blood glucose goal (before meals) = 80 to 130 Blood glucose goal after a meal = less than 180   Atrial  fibrillation  Pharmacist Clinical Goal(s) Over the next 90 days, patient will work with PharmD and providers to reduce complications associated with atrial fibrillation Current regimen:  Eliquis 2.5mg  twice daily in morning and with dinner Metoprolol succinate 25mg  daily Interventions: Maintain current atrial fibrillation medication regimen and plan to follow up with Dr Caryl Comes regarding pacemaker Will continue to monitor in 2023 for patient assistance program needs regarding Eliquis  Decreased Kidney Function:  Pharmacist Clinical Goal(s) Over the next 90 days, patient will work with PharmD and providers to slow / prevent further decrease in kidney function Current regimen:  Jardiance 10mg  daily Interventions: Reviewed medications for adjustment based on kidney funciton Patient self care activities - Over the next 90 days, patient will: Avoid over the counter pain relievers like Aleve / naproxen; Motrin / ibuprofen. If you need anything for pain relief, headaches or fever - Tylenol / acetaminophen 500mg  up to 2 tablets every 8 hours is safer option.   Medication management Pharmacist Clinical Goal(s): Over the next 90 days, patient will work with PharmD and providers to achieve optimal medication adherence Current pharmacy: [facility] Interventions Comprehensive medication review performed. Continue current medication management strategy Patient self care activities - Over the next 90 days, patient will: Focus on medication adherence by filling and taking medications appropriately  Take medications as prescribed Report any questions or concerns to PharmD and/or provider(s) I will leave applications for medication assistance program for Jardiance and Januvia at the front desk for you to review / sign when you are in the office 05/14/2021  Patient verbalizes understanding of instructions and care plan provided today and agrees to view in Agency. Active MyChart status confirmed with patient.

## 2021-05-11 ENCOUNTER — Ambulatory Visit (HOSPITAL_COMMUNITY)
Admit: 2021-05-11 | Discharge: 2021-05-11 | Disposition: A | Discharge status: Home / Self Care | Payer: PPO | Attending: Cardiology | Admitting: Cardiology

## 2021-05-11 ENCOUNTER — Other Ambulatory Visit: Payer: Self-pay

## 2021-05-11 ENCOUNTER — Encounter (HOSPITAL_COMMUNITY): Payer: Self-pay

## 2021-05-11 VITALS — BP 118/58 | HR 82 | Wt 204.5 lb

## 2021-05-11 DIAGNOSIS — Z950 Presence of cardiac pacemaker: Secondary | ICD-10-CM | POA: Insufficient documentation

## 2021-05-11 DIAGNOSIS — I130 Hypertensive heart and chronic kidney disease with heart failure and stage 1 through stage 4 chronic kidney disease, or unspecified chronic kidney disease: Secondary | ICD-10-CM | POA: Diagnosis not present

## 2021-05-11 DIAGNOSIS — I5022 Chronic systolic (congestive) heart failure: Secondary | ICD-10-CM | POA: Diagnosis not present

## 2021-05-11 DIAGNOSIS — I5082 Biventricular heart failure: Secondary | ICD-10-CM | POA: Insufficient documentation

## 2021-05-11 DIAGNOSIS — Z7901 Long term (current) use of anticoagulants: Secondary | ICD-10-CM | POA: Diagnosis not present

## 2021-05-11 DIAGNOSIS — I482 Chronic atrial fibrillation, unspecified: Secondary | ICD-10-CM | POA: Diagnosis not present

## 2021-05-11 DIAGNOSIS — Z79899 Other long term (current) drug therapy: Secondary | ICD-10-CM | POA: Diagnosis not present

## 2021-05-11 DIAGNOSIS — Z596 Low income: Secondary | ICD-10-CM | POA: Diagnosis not present

## 2021-05-11 DIAGNOSIS — N179 Acute kidney failure, unspecified: Secondary | ICD-10-CM | POA: Insufficient documentation

## 2021-05-11 DIAGNOSIS — N1832 Chronic kidney disease, stage 3b: Secondary | ICD-10-CM | POA: Insufficient documentation

## 2021-05-11 DIAGNOSIS — I495 Sick sinus syndrome: Secondary | ICD-10-CM | POA: Diagnosis not present

## 2021-05-11 DIAGNOSIS — E1122 Type 2 diabetes mellitus with diabetic chronic kidney disease: Secondary | ICD-10-CM | POA: Insufficient documentation

## 2021-05-11 DIAGNOSIS — Z7984 Long term (current) use of oral hypoglycemic drugs: Secondary | ICD-10-CM | POA: Diagnosis not present

## 2021-05-11 NOTE — Patient Instructions (Signed)
Continue current medications  Thank you for allowing [place] to provider your heart failure care after your recent hospitalization. Please follow-up with [facility] in 1 month, they will call you for that appointment  Do the following things EVERYDAY: Weigh yourself in the morning before breakfast. Write it down and keep it in a log. Take your medicines as prescribed Eat low salt foods--Limit salt (sodium) to 2000 mg per day.  Stay as active as you can everyday Limit all fluids for the day to less than 2 liters

## 2021-05-14 ENCOUNTER — Encounter: Payer: Self-pay | Admitting: Family Medicine

## 2021-05-14 ENCOUNTER — Ambulatory Visit (INDEPENDENT_AMBULATORY_CARE_PROVIDER_SITE_OTHER): Payer: PPO | Admitting: Family Medicine

## 2021-05-14 VITALS — BP 120/61 | HR 78 | Ht 72.0 in | Wt 202.0 lb

## 2021-05-14 DIAGNOSIS — E875 Hyperkalemia: Secondary | ICD-10-CM | POA: Diagnosis not present

## 2021-05-14 DIAGNOSIS — I5033 Acute on chronic diastolic (congestive) heart failure: Secondary | ICD-10-CM | POA: Diagnosis not present

## 2021-05-14 DIAGNOSIS — N179 Acute kidney failure, unspecified: Secondary | ICD-10-CM

## 2021-05-14 DIAGNOSIS — N1831 Chronic kidney disease, stage 3a: Secondary | ICD-10-CM | POA: Diagnosis not present

## 2021-05-14 LAB — BASIC METABOLIC PANEL
BUN: 49 mg/dL — ABNORMAL HIGH (ref 6–23)
CO2: 27 mEq/L (ref 19–32)
Calcium: 9.1 mg/dL (ref 8.4–10.5)
Chloride: 98 mEq/L (ref 96–112)
Creatinine, Ser: 2.19 mg/dL — ABNORMAL HIGH (ref 0.40–1.50)
GFR: 26.74 mL/min — ABNORMAL LOW (ref >60.00)
Glucose, Bld: 112 mg/dL — ABNORMAL HIGH (ref 70–99)
Potassium: 3.8 mEq/L (ref 3.5–5.1)
Sodium: 136 mEq/L (ref 135–145)

## 2021-05-14 LAB — CBC
HCT: 35.7 % — ABNORMAL LOW (ref 39.0–52.0)
Hemoglobin: 11.4 g/dL — ABNORMAL LOW (ref 13.0–17.0)
MCHC: 32 g/dL (ref 30.0–36.0)
MCV: 86 fl (ref 78.0–100.0)
Platelets: 183 10*3/uL (ref 150.0–400.0)
RBC: 4.15 Mil/uL — ABNORMAL LOW (ref 4.22–5.81)
RDW: 15.9 % — ABNORMAL HIGH (ref 11.5–15.5)
WBC: 5.6 10*3/uL (ref 4.0–10.5)

## 2021-05-14 NOTE — Patient Instructions (Signed)
So glad you are doing much better! Repeating some blood work today. Continue taking all of your medicines as prescribed and follow-up at your upcoming appointments. Let us know if you need anything in the meantime. We will update you with lab results.   Please contact office for follow-up if symptoms do not improve or worsen. Seek emergency care if symptoms become severe.

## 2021-05-14 NOTE — Progress Notes (Signed)
Acute Office Visit  Subjective:    Patient ID: Vincent Allen, male    DOB: 12-Dec-1933, 88 y.o.   MRN: [id number]  CC: hospital follow-up   HPI Patient is in today for hospital follow-up.   Admitted 04/20/21 Discharged 05/03/21 (discharge summary reviewed) Patient was admitted with biventricular, predominantly right heart failure.  -EF 40-45%, moderate MR -paracentesis on 04/21/21: 5L removed -received high-dose IV lasix and milrinone - 21 L negative by d/c -D/c'd on Lasix 80 mg daily, KCl, Aldactone, Toprol, Jardiance  -Declined SNF, d/c home with home health -Acute on chronic renal failure: baseline Cr 1.7-2 -Afib: metoprolol and apixaban; v-paced on telemetry  -DM: started Jardiance, resumed glipizide at d/c  05/09/21 Cardiology follow-up with Dr. Debara Pickett (note reviewed) -down to 201 lbs (new dry weight) -O2 sats improved, patient feeling well and ambulating without difficulty - 100% v-pacing, possibly d/t reduced LV function, consider eval for CRT-P, refer to EP specialist -Repeat labs: Cr 2.23 (1.63 at d/c), K 5.1 -> stopped aldactone and potassium, repeat in 1 week.    05/11/21 Heart & Vascular (CHF) follow-up (note reviewed) -Patient feeling decent at this appointment -Weight was 204 lbs -Had some residual BLE edema, no distention, good urination, not orthostatic    Today: Patient reports he is feeling quite a bit better. He has been taking all meds as prescribed and is having good urine output. Reports he is doing well with ADLs and ambulating at home with a cane. Home health is already set up for OT, PT, nurse, and food via hospital program. He has been wearing compression socks, elevating his feet when sitting, eating a low sodium diet, fluid restriction, and weighing daily (199.1 lbs at home today). He reports his breathing is great, no wheezing or shortness of breath, chest pain, edema, productive, cough, fevers, GI/GU symptoms. BP has been staying around 110/70s and  CBG around 100. He has upcoming appointments scheduled with nutrition/diabetes, EP/cardiology, and PCP. No complaints or concerns today.       Past Medical History:  Diagnosis Date   Arthritis    Bradycardia    Cataract    Chronic kidney disease stage III (GFR 30-59 ml/min) 05/03/2012   Colon polyps    Complication of anesthesia    emesis   Diabetes mellitus    GERD (gastroesophageal reflux disease)    HOH (hard of hearing)    HTN (hypertension) 01/13/2013   Hyperlipidemia    Hypertension    Pacemaker 12/2016   Persistent atrial fibrillation ([place])    Presence of permanent cardiac pacemaker 09/20/2016   Symptomatic bradycardia    Vitamin D deficiency 07/17/2015    Past Surgical History:  Procedure Laterality Date   ANKLE ARTHROPLASTY     CARDIOVERSION N/A 08/20/2016   Procedure: CARDIOVERSION;  Surgeon: Sanda Klein, MD;  Location: [facility];  Service: Cardiovascular;  Laterality: N/A;   CATARACT EXTRACTION  2010, 2014   CATARACT EXTRACTION  02/28/14   CHOLECYSTECTOMY     CHOLECYSTECTOMY N/A 05/03/2012   Procedure: LAPAROSCOPIC CHOLECYSTECTOMY WITH INTRAOPERATIVE CHOLANGIOGRAM;  Surgeon: [name], MD;  Location: [facility];  Service: General;  Laterality: N/A;   INSERT / REPLACE / REMOVE PACEMAKER  09/20/2016   PACEMAKER IMPLANT N/A 09/20/2016   Procedure: Pacemaker Implant;  Surgeon: Deboraha Sprang, MD;  Location: [facility];  Service: Cardiovascular;  Laterality: N/A;   ROTATOR CUFF REPAIR     SHOULDER SURGERY     TONSILLECTOMY      Family  History  Problem Relation Age of Onset   Cancer Mother        colon, breast, pancreas, skin cancer   Heart disease Father        heart valve replaced   Stroke Father    Diabetes Father    Cancer Paternal Grandmother        colon   Obesity Son    Heart disease Son        bradycardia    Social History   Socioeconomic History   Marital status: Widowed    Spouse name: Not on file   Number of children: Not on file    Years of education: Not on file   Highest education level: Not on file  Occupational History   Not on file  Tobacco Use   Smoking status: Never   Smokeless tobacco: Never  Vaping Use   Vaping Use: Never used  Substance and Sexual Activity   Alcohol use: Yes    Alcohol/week: 1.0 standard drink    Types: 1 Glasses of wine per week   Drug use: No   Sexual activity: Not on file    Comment: lives alone , no major dietary restrictions, 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.  Other Topics Concern   Not on file  Social History Narrative   Not on file   Social Determinants of Health   Financial Resource Strain: Medium Risk   Difficulty of Paying Living Expenses: Somewhat hard  Food Insecurity: No Food Insecurity   Worried About Running Out of Food in the Last Year: Never true   Ran Out of Food in the Last Year: Never true  Transportation Needs: No Transportation Needs   Lack of Transportation (Medical): No   Lack of Transportation (Non-Medical): No  Physical Activity: Insufficiently Active   Days of Exercise per Week: 7 days   Minutes of Exercise per Session: 10 min  Stress: No Stress Concern Present   Feeling of Stress : Not at all  Social Connections: Socially Isolated   Frequency of Communication with Friends and Family: More than three times a week   Frequency of Social Gatherings with Friends and Family: Three times a week   Attends Religious Services: Never   Active Member of Clubs or Organizations: No   Attends Archivist Meetings: Never   Marital Status: Widowed  Human resources officer Violence: Not At Risk   Fear of Current or Ex-Partner: No   Emotionally Abused: No   Physically Abused: No   Sexually Abused: No    Outpatient Medications Prior to Visit  Medication Sig Dispense Refill   acetaminophen (TYLENOL) 500 MG tablet Take 500 mg by mouth every 6 (six) hours as needed for moderate pain.     ELIQUIS 2.5 MG TABS tablet TAKE 1 TABLET  BY MOUTH TWICE A DAY 60 tablet 5   empagliflozin (JARDIANCE) 10 MG TABS tablet Take 1 tablet (10 mg total) by mouth daily. 30 tablet 0   furosemide (LASIX) 40 MG tablet Take 2 tablets (80 mg total) by mouth daily. 60 tablet 1   glipiZIDE (GLUCOTROL) 10 MG tablet Take 0.5 tablets (5 mg total) by mouth 2 (two) times daily before a meal. 360 tablet 1   glucose blood (ONE TOUCH ULTRA TEST) test strip USE 1 STRIP 2 TIMES DAILY TO CHECK BLOOD SUGAR DX E08.22, N18.3 300 each 1   Krill Oil 1000 MG CAPS Take 1,000 mg by mouth daily.  metoprolol succinate (TOPROL-XL) 25 MG 24 hr tablet Take 1 tablet (25 mg total) by mouth daily.     Multiple Vitamin (MULTIVITAMIN WITH MINERALS) TABS tablet Take 1 tablet by mouth daily.     OVER THE COUNTER MEDICATION Take 1 capsule by mouth 2 (two) times daily. Glucocil     sitaGLIPtin (JANUVIA) 25 MG tablet Take 1 tablet (25 mg total) by mouth daily. 30 tablet 2   tamsulosin (FLOMAX) 0.4 MG CAPS capsule Take 1 capsule (0.4 mg total) by mouth at bedtime. 90 capsule 3   Zinc 50 MG TABS Take 50 mg by mouth daily.     No facility-administered medications prior to visit.    Allergies  Allergen Reactions   Atorvastatin     Myalgias / leg pain and weakness   Penicillins Hives and Itching    Has patient had a PCN reaction causing immediate rash, facial/tongue/throat swelling, SOB or lightheadedness with hypotension:Yes Has patient had a PCN reaction causing severe rash involving mucus membranes or skin necrosis:No Has patient had a PCN reaction that required hospitalization:No Has patient had a PCN reaction occurring within the last 10 years:No If all of the above answers are "NO", then may proceed with Cephalosporin use.    Verapamil     Junctional rhythm   Influenza Vaccines Rash    tinnitus    Review of Systems All review of systems negative except what is listed in the HPI     Objective:    Physical Exam Vitals reviewed.  Constitutional:      General:  He is not in acute distress.    Appearance: Normal appearance. He is not ill-appearing.  Cardiovascular:     Rate and Rhythm: Normal rate and regular rhythm.  Pulmonary:     Effort: Pulmonary effort is normal.     Breath sounds: Normal breath sounds. No wheezing, rhonchi or rales.  Abdominal:     General: Abdomen is flat.     Palpations: Abdomen is soft.  Musculoskeletal:     Cervical back: Normal range of motion and neck supple.     Right lower leg: No edema.     Left lower leg: No edema.  Skin:    General: Skin is warm and dry.     Findings: No erythema or rash.  Neurological:     General: No focal deficit present.     Mental Status: He is alert and oriented to person, place, and time. Mental status is at baseline.  Psychiatric:        Mood and Affect: Mood normal.        Behavior: Behavior normal.        Thought Content: Thought content normal.        Judgment: Judgment normal.    There were no vitals taken for this visit. Wt Readings from Last 3 Encounters:  05/11/21 204 lb 8 oz (92.8 kg)  05/09/21 201 lb 11.2 oz (91.5 kg)  05/03/21 204 lb 5.9 oz (92.7 kg)    Health Maintenance Due  Topic Date Due   Zoster Vaccines- Shingrix (1 of 2) Never done   URINE MICROALBUMIN  07/15/2017   INFLUENZA VACCINE  10/16/2020    There are no preventive care reminders to display for this patient.   Lab Results  Component Value Date   TSH 4.58 04/02/2021   Lab Results  Component Value Date   WBC 6.7 05/03/2021   HGB 11.4 (L) 05/03/2021   HCT 35.3 (L) 05/03/2021  MCV 85.3 05/03/2021   PLT 221 05/03/2021   Lab Results  Component Value Date   NA 137 05/09/2021   K 5.1 05/09/2021   CO2 24 05/09/2021   GLUCOSE 127 (H) 05/09/2021   BUN 47 (H) 05/09/2021   CREATININE 2.23 (H) 05/09/2021   BILITOT 1.4 (H) 04/20/2021   ALKPHOS 65 04/20/2021   AST 23 04/20/2021   ALT 17 04/20/2021   PROT 6.5 04/20/2021   ALBUMIN 2.6 (L) 04/20/2021   CALCIUM 9.4 05/09/2021   ANIONGAP 10  05/03/2021   EGFR 28 (L) 05/09/2021   GFR 24.96 (L) 04/09/2021   Lab Results  Component Value Date   CHOL 125 04/02/2021   Lab Results  Component Value Date   HDL 31.30 (L) 04/02/2021   Lab Results  Component Value Date   LDLCALC 77 04/02/2021   Lab Results  Component Value Date   TRIG 84.0 04/02/2021   Lab Results  Component Value Date   CHOLHDL 4 04/02/2021   Lab Results  Component Value Date   HGBA1C 5.8 04/02/2021       Assessment & Plan:   1. Acute on chronic diastolic CHF (congestive heart failure) ([place]) Patient doing very well today. Almost at dry weight (202 lbs), no signs of fluid overload. Home health services already in place. For now, continue lasix 80 mg daily, metoprolol 25 mg daily, flomax 0.4 mg daily. Will update him with lab results and any changes to plan. Discussed significance of low sodium diet, compression socks, elevation of legs, daily weight, fluid restrictions, med compliance, keeping all upcoming appointments. Patient denies needing anything else today. Patient aware of signs/symptoms requiring further/urgent evaluation. Son present at visit today with no additional complaints or concerns.  - Basic metabolic panel - CBC  2. Hyperkalemia 3. Acute renal failure superimposed on stage 3a chronic kidney disease, unspecified acute renal failure type ([place]) Repeating BMP today since he has been off of the spironolactone and potassium for several days now. Asymptomatic.  - Basic metabolic panel  Please contact office for follow-up if symptoms do not improve or worsen. Seek emergency care if symptoms become severe.  I spent 30 minutes dedicated to the care of this patient on the date of this encounter to include pre-visit chart review of prior notes and results, face-to-face time with the patient, and post-visit ordering of testing as indicated.    Terrilyn Saver, NP

## 2021-05-15 DIAGNOSIS — N183 Chronic kidney disease, stage 3 unspecified: Secondary | ICD-10-CM | POA: Diagnosis not present

## 2021-05-15 DIAGNOSIS — E0822 Diabetes mellitus due to underlying condition with diabetic chronic kidney disease: Secondary | ICD-10-CM

## 2021-05-15 DIAGNOSIS — I5032 Chronic diastolic (congestive) heart failure: Secondary | ICD-10-CM | POA: Diagnosis not present

## 2021-05-15 DIAGNOSIS — I10 Essential (primary) hypertension: Secondary | ICD-10-CM

## 2021-05-16 NOTE — Addendum Note (Signed)
Addended by: Caleen Jobs B on: 05/16/2021 08:12 AM   Modules accepted: Orders

## 2021-05-16 NOTE — Progress Notes (Signed)
Kidney function is still slightly off - please schedule another lab appointment for 1 week so we can ensure levels are continuing to improve.  Blood counts are at baseline.

## 2021-05-17 DIAGNOSIS — K219 Gastro-esophageal reflux disease without esophagitis: Secondary | ICD-10-CM | POA: Diagnosis not present

## 2021-05-17 DIAGNOSIS — E876 Hypokalemia: Secondary | ICD-10-CM | POA: Diagnosis not present

## 2021-05-17 DIAGNOSIS — E440 Moderate protein-calorie malnutrition: Secondary | ICD-10-CM | POA: Diagnosis not present

## 2021-05-17 DIAGNOSIS — I130 Hypertensive heart and chronic kidney disease with heart failure and stage 1 through stage 4 chronic kidney disease, or unspecified chronic kidney disease: Secondary | ICD-10-CM | POA: Diagnosis not present

## 2021-05-17 DIAGNOSIS — Z9181 History of falling: Secondary | ICD-10-CM | POA: Diagnosis not present

## 2021-05-17 DIAGNOSIS — N1832 Chronic kidney disease, stage 3b: Secondary | ICD-10-CM | POA: Diagnosis not present

## 2021-05-17 DIAGNOSIS — H9190 Unspecified hearing loss, unspecified ear: Secondary | ICD-10-CM | POA: Diagnosis not present

## 2021-05-17 DIAGNOSIS — I5043 Acute on chronic combined systolic (congestive) and diastolic (congestive) heart failure: Secondary | ICD-10-CM | POA: Diagnosis not present

## 2021-05-17 DIAGNOSIS — I50810 Right heart failure, unspecified: Secondary | ICD-10-CM | POA: Diagnosis not present

## 2021-05-17 DIAGNOSIS — Z7984 Long term (current) use of oral hypoglycemic drugs: Secondary | ICD-10-CM | POA: Diagnosis not present

## 2021-05-17 DIAGNOSIS — Z9049 Acquired absence of other specified parts of digestive tract: Secondary | ICD-10-CM | POA: Diagnosis not present

## 2021-05-17 DIAGNOSIS — E785 Hyperlipidemia, unspecified: Secondary | ICD-10-CM | POA: Diagnosis not present

## 2021-05-17 DIAGNOSIS — E1122 Type 2 diabetes mellitus with diabetic chronic kidney disease: Secondary | ICD-10-CM | POA: Diagnosis not present

## 2021-05-17 DIAGNOSIS — Z950 Presence of cardiac pacemaker: Secondary | ICD-10-CM | POA: Diagnosis not present

## 2021-05-17 DIAGNOSIS — I4819 Other persistent atrial fibrillation: Secondary | ICD-10-CM | POA: Diagnosis not present

## 2021-05-17 DIAGNOSIS — E559 Vitamin D deficiency, unspecified: Secondary | ICD-10-CM | POA: Diagnosis not present

## 2021-05-17 DIAGNOSIS — Z7901 Long term (current) use of anticoagulants: Secondary | ICD-10-CM | POA: Diagnosis not present

## 2021-05-21 ENCOUNTER — Ambulatory Visit: Payer: PPO | Admitting: Dietician

## 2021-05-22 ENCOUNTER — Other Ambulatory Visit: Payer: Self-pay | Admitting: Family Medicine

## 2021-05-22 ENCOUNTER — Encounter: Payer: Self-pay | Admitting: *Deleted

## 2021-05-24 ENCOUNTER — Other Ambulatory Visit: Payer: Self-pay

## 2021-05-24 ENCOUNTER — Encounter: Payer: Self-pay | Admitting: Internal Medicine

## 2021-05-24 ENCOUNTER — Ambulatory Visit: Payer: PPO | Admitting: Internal Medicine

## 2021-05-24 VITALS — BP 100/52 | HR 65 | Ht 72.0 in | Wt 203.0 lb

## 2021-05-24 DIAGNOSIS — I5032 Chronic diastolic (congestive) heart failure: Secondary | ICD-10-CM | POA: Diagnosis not present

## 2021-05-24 DIAGNOSIS — I420 Dilated cardiomyopathy: Secondary | ICD-10-CM

## 2021-05-24 DIAGNOSIS — Z01812 Encounter for preprocedural laboratory examination: Secondary | ICD-10-CM | POA: Diagnosis not present

## 2021-05-24 DIAGNOSIS — I4821 Permanent atrial fibrillation: Secondary | ICD-10-CM | POA: Diagnosis not present

## 2021-05-24 DIAGNOSIS — Z950 Presence of cardiac pacemaker: Secondary | ICD-10-CM

## 2021-05-24 NOTE — Progress Notes (Signed)
Cardiology Office Note:    Date:  05/28/2021   ID:  Vincent Allen, DOB 1933-07-11, MRN [id number]  PCP:  [name], MD  Cardiologist:  Pixie Casino, MD  Electrophysiologist:  Vincent Axe, MD   Referring MD: [name], MD   Chief Complaint: follow-up of CHF  History of Present Illness:    Vincent Allen is a 88 y.o. male with a history of chronic combined CHF with EF of 40-45%, permanent atrial fibrillation on Eliquis, symptomatic bradycardia s/p PPM, hypertension, hyperlipidemia, type 2 diabetes mellitus, and CKD stage III who is followed by Dr. Debara Pickett and presents today for follow-up of CHF.  Patient has a history of permanent atrial fibrillation as well as symptomatic bradycardia and underwent placement of dual-chamber Medtronic PPM in 09/2016 by Dr. Caryl Comes.  Does not look like patient has ever had an ischemic evaluation.  Echo in 08/2020 showed LVEF of 40-45% with normal wall motion and moderate MR.  He was recently admitted from 04/20/2021 to 05/03/2021 for acute on chronic CHF after presenting with a 3-week history of dyspnea on exertion, a 20-25lb weight gain, and significant increasing abdominal girth..  Echo showed LVEF of 40-45% with normal diastolic function, mildly reduced RV systolic function, no MR, mild to moderate TR, and moderately elevated PASP.  He was aggressively diuresed with IV Lasix with initial poor response due to low output and AKI. He required Milrinone with subsequent improvement in diuresis and renal function. He also underwent ultrasound-guided paracentesis with removal of 5 L of yellow fluid on 2/4.  He was net negative 21 L during admission and discharge weight was 204 lbs (down from 246 lbs on admission).  He was discharged on Lasix 80 mg daily and Spironolactone 12.5 mg daily.   Patient was seen by Dr. Debara Pickett on 05/09/2021 for follow-up at which time he was doing relatively well since discharge.  Weight was stable at 201 lbs.  He was referred to EP  for evaluation of CRT-P therapy given underlying LBBB and 100% ventricular pacing on last device check which was felt to possibly the cause of his cardiomyopathy.  He was also started on Flomax for his BPH.  Spironolactone had to be stopped due to rise in creatinine and potassium. He was also seen by the [facility] on 05/11/2021.  Patient was recently seen by Dr. Caryl Comes on 05/24/2021 and decision was made to proceed with pacemaker upgrade and CRT-P implantation on 06/22/2021.  Patient presents today for follow-up.  Here with son.  Patient has been doing well.  Weight is stable at 203 lbs in the office and he states it is stable at home as well. He is working with PT at home and is getting stronger.  He is now able to ambulate around the house without a cane.  He denies any significant shortness of breath with exertion.  No orthopnea or PND.  He has mild lower extremity swelling but it is well controlled with Lasix.  No chest pain, palpitations, lightheadedness, dizziness, syncope. Biggest complaint today is frequent urination at night. He takes his Lasix at 7am in the morning and still has to get up multiple times throughout the night. He does have BPH and has an upcoming visit with Urology within the next month or two.  Past Medical History:  Diagnosis Date   Arthritis    Bradycardia    Cataract    Chronic kidney disease stage III (GFR 30-59 ml/min) 05/03/2012   Colon  polyps    Complication of anesthesia    emesis   Diabetes mellitus    GERD (gastroesophageal reflux disease)    HOH (hard of hearing)    HTN (hypertension) 01/13/2013   Hyperlipidemia    Hypertension    Pacemaker 12/2016   Persistent atrial fibrillation ([place])    Presence of permanent cardiac pacemaker 09/20/2016   Symptomatic bradycardia    Vitamin D deficiency 07/17/2015    Past Surgical History:  Procedure Laterality Date   ANKLE ARTHROPLASTY     CARDIOVERSION N/A 08/20/2016   Procedure: CARDIOVERSION;  Surgeon:  Sanda Klein, MD;  Location: [facility];  Service: Cardiovascular;  Laterality: N/A;   CATARACT EXTRACTION  2010, 2014   CATARACT EXTRACTION  02/28/14   CHOLECYSTECTOMY     CHOLECYSTECTOMY N/A 05/03/2012   Procedure: LAPAROSCOPIC CHOLECYSTECTOMY WITH INTRAOPERATIVE CHOLANGIOGRAM;  Surgeon: [name], MD;  Location: [facility];  Service: General;  Laterality: N/A;   INSERT / REPLACE / REMOVE PACEMAKER  09/20/2016   PACEMAKER IMPLANT N/A 09/20/2016   Procedure: Pacemaker Implant;  Surgeon: Deboraha Sprang, MD;  Location: [place] [facility];  Service: Cardiovascular;  Laterality: N/A;   ROTATOR CUFF REPAIR     SHOULDER SURGERY     TONSILLECTOMY      Current Medications: Current Meds  Medication Sig   acetaminophen (TYLENOL) 500 MG tablet Take 500 mg by mouth every 6 (six) hours as needed for moderate pain.   ELIQUIS 2.5 MG TABS tablet TAKE 1 TABLET BY MOUTH TWICE A DAY   empagliflozin (JARDIANCE) 10 MG TABS tablet Take 1 tablet (10 mg total) by mouth daily.   furosemide (LASIX) 40 MG tablet Take 2 tablets (80 mg total) by mouth daily.   glipiZIDE (GLUCOTROL) 10 MG tablet Take 0.5 tablets (5 mg total) by mouth 2 (two) times daily before a meal.   glucose blood (ONE TOUCH ULTRA TEST) test strip USE 1 STRIP 2 TIMES DAILY TO CHECK BLOOD SUGAR DX E08.22, N18.3   Krill Oil 1000 MG CAPS Take 1,000 mg by mouth daily.   Multiple Vitamin (MULTIVITAMIN WITH MINERALS) TABS tablet Take 1 tablet by mouth daily.   OVER THE COUNTER MEDICATION Take 1 capsule by mouth 2 (two) times daily. Glucocil   sitaGLIPtin (JANUVIA) 25 MG tablet Take 1 tablet (25 mg total) by mouth daily.   tamsulosin (FLOMAX) 0.4 MG CAPS capsule Take 1 capsule (0.4 mg total) by mouth at bedtime.   Zinc 50 MG TABS Take 50 mg by mouth daily.     Allergies:   Atorvastatin, Penicillins, Verapamil, and Influenza vaccines   Social History   Socioeconomic History   Marital status: Widowed    Spouse name: Not on file   Number of  children: Not on file   Years of education: Not on file   Highest education level: Not on file  Occupational History   Not on file  Tobacco Use   Smoking status: Never   Smokeless tobacco: Never  Vaping Use   Vaping Use: Never used  Substance and Sexual Activity   Alcohol use: Yes    Alcohol/week: 1.0 standard drink    Types: 1 Glasses of wine per week   Drug use: No   Sexual activity: Not on file    Comment: lives alone , no major dietary restrictions, 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.  Other Topics Concern   Not on file  Social History Narrative   Not on file  Social Determinants of Health   Financial Resource Strain: Medium Risk   Difficulty of Paying Living Expenses: Somewhat hard  Food Insecurity: No Food Insecurity   Worried About Charity fundraiser in the Last Year: Never true   Ran Out of Food in the Last Year: Never true  Transportation Needs: No Transportation Needs   Lack of Transportation (Medical): No   Lack of Transportation (Non-Medical): No  Physical Activity: Insufficiently Active   Days of Exercise per Week: 7 days   Minutes of Exercise per Session: 10 min  Stress: No Stress Concern Present   Feeling of Stress : Not at all  Social Connections: Socially Isolated   Frequency of Communication with Friends and Family: More than three times a week   Frequency of Social Gatherings with Friends and Family: Three times a week   Attends Religious Services: Never   Active Member of Clubs or Organizations: No   Attends Archivist Meetings: Never   Marital Status: Widowed     Family History: The patient's family history includes Cancer in his mother and paternal grandmother; Diabetes in his father; Heart disease in his father and son; Obesity in his son; Stroke in his father.  ROS:   Please see the history of present illness.     EKGs/Labs/Other Studies Reviewed:    The following studies were reviewed  today:  Echocardiogram 04/22/2021: Impressions:  1. Windows are limited. Challenging to fully assess wall motion. LVEF  approximately 40-45%. Left ventricular ejection fraction, by estimation,  is 40 to 45%. The left ventricle has mildly decreased function. Left  ventricular diastolic parameters were  normal.   2. Right ventricular systolic function is mildly reduced. The right  ventricular size is normal. There is moderately elevated pulmonary artery  systolic pressure.   3. The mitral valve is grossly normal. No evidence of mitral valve  regurgitation.   4. Tricuspid valve regurgitation is mild to moderate.   5. Aortic valve regurgitation is not visualized. Aortic valve  sclerosis/calcification is present, without any evidence of aortic  stenosis.   6. The inferior vena cava IVC not well visualized.   Comparison(s): No significant change from prior study.   EKG:  EKG not ordered today.   Recent Labs: 04/02/2021: TSH 4.58 04/20/2021: ALT 17; B Natriuretic Peptide 1,460.7 05/09/2021: Magnesium 2.0 05/24/2021: BUN 45; Creatinine, Ser 2.06; Hemoglobin 11.8; Platelets 142; Potassium 3.7; Sodium 143  Recent Lipid Panel    Component Value Date/Time   CHOL 125 04/02/2021 1456   TRIG 84.0 04/02/2021 1456   HDL 31.30 (L) 04/02/2021 1456   CHOLHDL 4 04/02/2021 1456   VLDL 16.8 04/02/2021 1456   LDLCALC 77 04/02/2021 1456   LDLCALC 101 (H) 12/16/2019 1543   LDLDIRECT 65.0 11/03/2018 1038    Physical Exam:    Vital Signs: BP 124/62 (BP Location: Left Arm, Patient Position: Sitting, Cuff Size: Normal)    Pulse 77    Ht 6' (1.829 m)    Wt 203 lb (92.1 kg)    BMI 27.53 kg/m     Wt Readings from Last 3 Encounters:  05/28/21 203 lb (92.1 kg)  05/24/21 203 lb (92.1 kg)  05/14/21 202 lb (91.6 kg)     General: 88 y.o. Caucasian male in no acute distress. HEENT: Normocephalic and atraumatic. Sclera clear.  Neck: Supple. No carotid bruits. No JVD. Heart: RRR. Distinct S1 and S2. Very soft  systolic murmur noted. No gallops or rubs. Radial and distal pedal pulses 2+  and equal bilaterally. Lungs: No increased work of breathing. Clear to ausculation bilaterally. No wheezes, rhonchi, or rales.  Abdomen: Soft, non-distended, and non-tender to palpation.  Extremities: No to trace lower extremity edema bilaterally (left leg chronically larger than right).    Skin: Warm and dry. Neuro: Alert and oriented x3. No focal deficits. Psych: Normal affect. Responds appropriately.  Assessment:    1. Chronic combined systolic and diastolic CHF (congestive heart failure) ([place])   2. Permanent atrial fibrillation ([place])   3. Symptomatic bradycardia   4. Primary hypertension   5. Hyperlipidemia, unspecified hyperlipidemia type   6. Type 2 diabetes mellitus with complication, without long-term current use of insulin (HCC)   7. Stage 3 chronic kidney disease, unspecified whether stage 3a or 3b CKD ([place])     Plan:    Chronic Combined CHF Patient recently admitted with acute on chronic CHF.  Echo showed LVEF of 40-45% with normal diastolic function, mildly reduced RV systolic function, no MR, mild to moderate TR, and moderately elevated PASP.  He was aggressively diuresed with IV Lasix with the assistance of Milrinone.  Also underwent paracentesis for ascites with removal of 5 L of fluid. - Euvolemic on exam.  - Continue Lasix '80mg'$  daily - Continue Toprol-XL '25mg'$  daily. - Continue Jardiance '10mg'$  daily. - Initially started on Spironolactone but this had to be stopped due to renal function and elevated potassium. - No ARB/ARNI/Digoxin due to CKD. - Continue daily weights and sodium/fluid restrictions. - He was recently seen by Dr. Caryl Comes for consideration of CRT-P therapy given underlying LBBB and 100% ventricular pacing on last device check which was felt to possibly the cause of his cardiomyopathy. Plan is for CRT-P implantation on 06/22/2021.  Permanent Atrial Fibrillation Symptomatic Bradycardia  s/p Medtronic PPM Recent device check showed 100% ventricular paced rhythm. - Continue Toprol-XL '25mg'$  daily. - Continue Eliquis 2.'5mg'$  twice daily (reduced dose due to renal function and age).  Hypertension BP well controlled in the office. BP soft at times at home in the 100s/50s. Asymptomatic with this. - Continue medications for CHF as above. - Advised patient to let us know if systolic BP is consisently below 100.  Hyperlipidemia Lipid panel in 03/2021: Total Cholesterol 125, Triglycerides 84, HDL 31.3, LDL 77. - Not on any statins. Intolerant to Lipitor in the past.   Type 2 Diabetes Mellitus Hemoglobin A1c 5.8% in 03/2021. - On Jardiance and Glipizide.  CKD Stage IIIb Baseline creatinine 1.6 to 1.8 in 2022 but since most recent hospitalization has been around 1.9 to 2.2.  - Creatinine 2.06 on last check on 05/24/2021. - PCP is following closely. Also has follow-up with Nephrology soon.  Disposition: Follow up in 4 months with Dr. Debara Pickett.   Medication Adjustments/Labs and Tests Ordered: Current medicines are reviewed at length with the patient today.  Concerns regarding medicines are outlined above.  No orders of the defined types were placed in this encounter.  No orders of the defined types were placed in this encounter.   Patient Instructions  Medication Instructions:  No Changes *If you need a refill on your cardiac medications before your next appointment, please call your pharmacy*   Lab Work: No Labs If you have labs (blood work) drawn today and your tests are completely normal, you will receive your results only by: [place] (if you have MyChart) OR A paper copy in the mail If you have any lab test that is abnormal or we need to change your treatment, we will  call you to review the results.   Testing/Procedures: No Testing   Follow-Up: At [facility], you and your health needs are our priority.  As part of our continuing mission to provide you with  exceptional heart care, we have created designated Provider Care Teams.  These Care Teams include your primary Cardiologist (physician) and Advanced Practice Providers (APPs -  Physician Assistants and Nurse Practitioners) who all work together to provide you with the care you need, when you need it.  We recommend signing up for the patient portal called "MyChart".  Sign up information is provided on this After Visit Summary.  MyChart is used to connect with patients for Virtual Visits (Telemedicine).  Patients are able to view lab/test results, encounter notes, upcoming appointments, etc.  Non-urgent messages can be sent to your provider as well.   To learn more about what you can do with MyChart, go to [email or web address].    Your next appointment:   4 month(s)  The format for your next appointment:   In Person  Provider:   Pixie Casino, MD         Signed, Darreld Mclean, PA-C  05/28/2021 12:05 PM    [place]

## 2021-05-24 NOTE — Patient Instructions (Signed)
Medication Instructions:  ?Your physician has recommended you make the following change in your medication:  ? ?** Stop Toprol for now due to low blood pressure. ? ?*If you need a refill on your cardiac medications before your next appointment, please call your pharmacy* ? ? ?Lab Work: ? ?CBC and BMET today ? ?If you have labs (blood work) drawn today and your tests are completely normal, you will receive your results only by: ?MyChart Message (if you have MyChart) OR ?A paper copy in the mail ?If you have any lab test that is abnormal or we need to change your treatment, we will call you to review the results. ? ? ?Testing/Procedures: ?CRT or cardiac resynchronization therapy is a treatment used to correct heart failure. When you have heart failure your heart is weakened and doesn?t pump as well as it should. This therapy may help reduce symptoms and improve the quality of life.  Please see the handout/brochure given to you today to get more information of the different options of therapy. ? ? ? ?Follow-Up: ?At [facility], you and your health needs are our priority.  As part of our continuing mission to provide you with exceptional heart care, we have created designated Provider Care Teams.  These Care Teams include your primary Cardiologist (physician) and Advanced Practice Providers (APPs -  Physician Assistants and Nurse Practitioners) who all work together to provide you with the care you need, when you need it. ? ?We recommend signing up for the patient portal called "MyChart".  Sign up information is provided on this After Visit Summary.  MyChart is used to connect with patients for Virtual Visits (Telemedicine).  Patients are able to view lab/test results, encounter notes, upcoming appointments, etc.  Non-urgent messages can be sent to your provider as well.   ?To learn more about what you can do with MyChart, go to [email or web address].   ? ?Your next appointment:   ?To be scheduled ?

## 2021-05-24 NOTE — Progress Notes (Signed)
? ? ? ? ? ?Patient Care Team: ?[name], MD as PCP - General (Family Medicine) ?Pixie Casino, MD as PCP - Cardiology (Cardiology) ?Clent Jacks, MD as Consulting Physician (Ophthalmology) ?Danella Sensing, MD as Consulting Physician (Dermatology) ?Dorothy Spark, MD as Consulting Physician (Cardiology) ?[name], MD as Consulting Physician (Orthopedic Surgery) ?Wallene Huh, DPM as Consulting Physician (Podiatry) ?Cherre Robins, RPH-CPP (Pharmacist) ? ? ?HPI ? ?Vincent Allen is a 88 y.o. male ?Seen in follow-up for bradycardia with atrial fibrillation.  Dual-chamber Medtronic pacemaker 7/18. The hope had been that improving his heart rate would attenuate symptoms following pacing and we could leave him in atrial fibrillation.   \ ? ?Permanent atrial fibrillation; anticoagulated with apixaban no bleeding ? ?Hospitalized 1/23 with congestive heart failure underwent inotropic support and diuresis from 244 pounds--201 pounds.  He was noted to be ventricularly pacing 100% of the time and is referred for consideration of CRT upgrade ? ?Since discharge, he is better.  Edema continues to improve.  Minimal lightheadedness with standing.  No chest pain.  ? ?DATE TEST EF   ?4/18 Echo  55%   ?2/23 Echo   40-45 %   ?     ?     ? ?Date Cr K Hgb  ?9/21   15.2  ?2/23 2.19<<1.9 3.8 9.1>>11.4  ?      ? ? ?  ?Thromboembolic risk factors ( age - -2, HTN-1, DM-1, Vasc disease -1) for a CHADSVASc Score of 5;  He is on reduced dose apixoban  ? ?Past Medical History:  ?Diagnosis Date  ? Arthritis   ? Bradycardia   ? Cataract   ? Chronic kidney disease stage III (GFR 30-59 ml/min) 05/03/2012  ? Colon polyps   ? Complication of anesthesia   ? emesis  ? Diabetes mellitus   ? GERD (gastroesophageal reflux disease)   ? HOH (hard of hearing)   ? HTN (hypertension) 01/13/2013  ? Hyperlipidemia   ? Hypertension   ? Pacemaker 12/2016  ? Persistent atrial fibrillation ([place])   ? Presence of permanent cardiac pacemaker  09/20/2016  ? Symptomatic bradycardia   ? Vitamin D deficiency 07/17/2015  ? ? ?Past Surgical History:  ?Procedure Laterality Date  ? ANKLE ARTHROPLASTY    ? CARDIOVERSION N/A 08/20/2016  ? Procedure: CARDIOVERSION;  Surgeon: Sanda Carden Teel, MD;  Location: [facility];  Service: Cardiovascular;  Laterality: N/A;  ? CATARACT EXTRACTION  2010, 2014  ? CATARACT EXTRACTION  02/28/14  ? CHOLECYSTECTOMY    ? CHOLECYSTECTOMY N/A 05/03/2012  ? Procedure: LAPAROSCOPIC CHOLECYSTECTOMY WITH INTRAOPERATIVE CHOLANGIOGRAM;  Surgeon: [name], MD;  Location: [facility];  Service: General;  Laterality: N/A;  ? INSERT / REPLACE / REMOVE PACEMAKER  09/20/2016  ? PACEMAKER IMPLANT N/A 09/20/2016  ? Procedure: Pacemaker Implant;  Surgeon: Deboraha Sprang, MD;  Location: [place] [facility];  Service: Cardiovascular;  Laterality: N/A;  ? ROTATOR CUFF REPAIR    ? SHOULDER SURGERY    ? TONSILLECTOMY    ? ? ?Current Outpatient Medications  ?Medication Sig Dispense Refill  ? acetaminophen (TYLENOL) 500 MG tablet Take 500 mg by mouth every 6 (six) hours as needed for moderate pain.    ? ELIQUIS 2.5 MG TABS tablet TAKE 1 TABLET BY MOUTH TWICE A DAY 60 tablet 5  ? empagliflozin (JARDIANCE) 10 MG TABS tablet Take 1 tablet (10 mg total) by mouth daily. 30 tablet 0  ? furosemide (LASIX) 40 MG tablet Take 2  tablets (80 mg total) by mouth daily. 60 tablet 1  ? glipiZIDE (GLUCOTROL) 10 MG tablet Take 0.5 tablets (5 mg total) by mouth 2 (two) times daily before a meal. 360 tablet 1  ? glucose blood (ONE TOUCH ULTRA TEST) test strip USE 1 STRIP 2 TIMES DAILY TO CHECK BLOOD SUGAR DX E08.22, N18.3 300 each 1  ? Krill Oil 1000 MG CAPS Take 1,000 mg by mouth daily.    ? Multiple Vitamin (MULTIVITAMIN WITH MINERALS) TABS tablet Take 1 tablet by mouth daily.    ? OVER THE COUNTER MEDICATION Take 1 capsule by mouth 2 (two) times daily. Glucocil    ? sitaGLIPtin (JANUVIA) 25 MG tablet Take 1 tablet (25 mg total) by mouth daily. 30 tablet 2  ? tamsulosin (FLOMAX)  0.4 MG CAPS capsule Take 1 capsule (0.4 mg total) by mouth at bedtime. 90 capsule 3  ? Zinc 50 MG TABS Take 50 mg by mouth daily.    ? ?No current facility-administered medications for this visit.  ? ? ?Allergies  ?Allergen Reactions  ? Atorvastatin   ?  Myalgias / leg pain and weakness  ? Penicillins Hives and Itching  ?  Has patient had a PCN reaction causing immediate rash, facial/tongue/throat swelling, SOB or lightheadedness with hypotension:Yes ?Has patient had a PCN reaction causing severe rash involving mucus membranes or skin necrosis:No ?Has patient had a PCN reaction that required hospitalization:No ?Has patient had a PCN reaction occurring within the last 10 years:No ?If all of the above answers are "NO", then may proceed with Cephalosporin use. ?  ? Verapamil   ?  Junctional rhythm  ? Influenza Vaccines Rash  ?  tinnitus  ? ? ? ? ?Review of Systems negative except from HPI and PMH ? ?Physical Exam ?BP (!) 100/52   Pulse 65   Ht 6' (1.829 m)   Wt 203 lb (92.1 kg)   SpO2 98%   BMI 27.53 kg/m?  ?Well developed and well nourished in no acute distress ?HENT normal ?Neck supple with JVP-7-8 cm ?Clear ?Device pocket well healed; without hematoma or erythema.  There is no tethering  ?Regular rate and rhythm, no  murmur ?Abd-soft with active BS ?No Clubbing cyanosis   2+ edema ?Skin-warm and dry ?A & Oriented  Grossly normal sensory and motor function ? ?ECG   ?Atrial fibrillation with ventricular pacing   ? ? ?Assessment and  Plan ? ?Atrial fibrillation-persistent slow ventricular response ? ?Dyspnea on exertion  ? ?First-degree AV block ? ?Congestive heart failure systolic/diastolic/acute on chronic ? ?Pacemaker  ? ?Renal dysfunction grade 4 ?Estimated Creatinine Clearance: 27.1 mL/min (A) (by C-G formula based on SCr of 2.19 mg/dL (H)). ? ?  ?Patient has interval deterioration of LV function.  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  Resynchronization to be associated with significant interval improvement in LV function.  His renal function is concerning in terms of access and we discussed that we would approach access without contrast but we might need help with this.  We also discussed the fact that coronary sinus pacing frequently requires some degree of contrast in these exposures put his kidneys at risk.  We could also attempt left bundle branch area pacing as a contrast free alternative.  We reviewed further the risks and benefits including but not limited to perforation infection and bleeding.  He and his daughter understand these  risks and are willing to proceed.  His apixaban will be held for 24 hours prior to the procedure ? ? ? ?Current medicines are reviewed at length with the patient today .  The patient does not  have concerns regarding medicines. ? ?

## 2021-05-24 NOTE — H&P (View-Only) (Signed)
? ? ? ? ? ?Patient Care Team: ?[name], MD as PCP - General (Family Medicine) ?Pixie Casino, MD as PCP - Cardiology (Cardiology) ?Clent Jacks, MD as Consulting Physician (Ophthalmology) ?Danella Sensing, MD as Consulting Physician (Dermatology) ?Dorothy Spark, MD as Consulting Physician (Cardiology) ?[name], MD as Consulting Physician (Orthopedic Surgery) ?Wallene Huh, DPM as Consulting Physician (Podiatry) ?Cherre Robins, RPH-CPP (Pharmacist) ? ? ?HPI ? ?Vincent Allen is a 88 y.o. male ?Seen in follow-up for bradycardia with atrial fibrillation.  Dual-chamber Medtronic pacemaker 7/18. The hope had been that improving his heart rate would attenuate symptoms following pacing and we could leave him in atrial fibrillation.   \ ? ?Permanent atrial fibrillation; anticoagulated with apixaban no bleeding ? ?Hospitalized 1/23 with congestive heart failure underwent inotropic support and diuresis from 244 pounds--201 pounds.  He was noted to be ventricularly pacing 100% of the time and is referred for consideration of CRT upgrade ? ?Since discharge, he is better.  Edema continues to improve.  Minimal lightheadedness with standing.  No chest pain.  ? ?DATE TEST EF   ?4/18 Echo  55%   ?2/23 Echo   40-45 %   ?     ?     ? ?Date Cr K Hgb  ?9/21   15.2  ?2/23 2.19<<1.9 3.8 9.1>>11.4  ?      ? ? ?  ?Thromboembolic risk factors ( age - -2, HTN-1, DM-1, Vasc disease -1) for a CHADSVASc Score of 5;  He is on reduced dose apixoban  ? ?Past Medical History:  ?Diagnosis Date  ? Arthritis   ? Bradycardia   ? Cataract   ? Chronic kidney disease stage III (GFR 30-59 ml/min) 05/03/2012  ? Colon polyps   ? Complication of anesthesia   ? emesis  ? Diabetes mellitus   ? GERD (gastroesophageal reflux disease)   ? HOH (hard of hearing)   ? HTN (hypertension) 01/13/2013  ? Hyperlipidemia   ? Hypertension   ? Pacemaker 12/2016  ? Persistent atrial fibrillation ([place])   ? Presence of permanent cardiac pacemaker  09/20/2016  ? Symptomatic bradycardia   ? Vitamin D deficiency 07/17/2015  ? ? ?Past Surgical History:  ?Procedure Laterality Date  ? ANKLE ARTHROPLASTY    ? CARDIOVERSION N/A 08/20/2016  ? Procedure: CARDIOVERSION;  Surgeon: Sanda Myeisha Kruser, MD;  Location: [facility];  Service: Cardiovascular;  Laterality: N/A;  ? CATARACT EXTRACTION  2010, 2014  ? CATARACT EXTRACTION  02/28/14  ? CHOLECYSTECTOMY    ? CHOLECYSTECTOMY N/A 05/03/2012  ? Procedure: LAPAROSCOPIC CHOLECYSTECTOMY WITH INTRAOPERATIVE CHOLANGIOGRAM;  Surgeon: [name], MD;  Location: [facility];  Service: General;  Laterality: N/A;  ? INSERT / REPLACE / REMOVE PACEMAKER  09/20/2016  ? PACEMAKER IMPLANT N/A 09/20/2016  ? Procedure: Pacemaker Implant;  Surgeon: Deboraha Sprang, MD;  Location: [facility];  Service: Cardiovascular;  Laterality: N/A;  ? ROTATOR CUFF REPAIR    ? SHOULDER SURGERY    ? TONSILLECTOMY    ? ? ?Current Outpatient Medications  ?Medication Sig Dispense Refill  ? acetaminophen (TYLENOL) 500 MG tablet Take 500 mg by mouth every 6 (six) hours as needed for moderate pain.    ? ELIQUIS 2.5 MG TABS tablet TAKE 1 TABLET BY MOUTH TWICE A DAY 60 tablet 5  ? empagliflozin (JARDIANCE) 10 MG TABS tablet Take 1 tablet (10 mg total) by mouth daily. 30 tablet 0  ? furosemide (LASIX) 40 MG tablet Take 2  tablets (80 mg total) by mouth daily. 60 tablet 1  ? glipiZIDE (GLUCOTROL) 10 MG tablet Take 0.5 tablets (5 mg total) by mouth 2 (two) times daily before a meal. 360 tablet 1  ? glucose blood (ONE TOUCH ULTRA TEST) test strip USE 1 STRIP 2 TIMES DAILY TO CHECK BLOOD SUGAR DX E08.22, N18.3 300 each 1  ? Krill Oil 1000 MG CAPS Take 1,000 mg by mouth daily.    ? Multiple Vitamin (MULTIVITAMIN WITH MINERALS) TABS tablet Take 1 tablet by mouth daily.    ? OVER THE COUNTER MEDICATION Take 1 capsule by mouth 2 (two) times daily. Glucocil    ? sitaGLIPtin (JANUVIA) 25 MG tablet Take 1 tablet (25 mg total) by mouth daily. 30 tablet 2  ? tamsulosin (FLOMAX)  0.4 MG CAPS capsule Take 1 capsule (0.4 mg total) by mouth at bedtime. 90 capsule 3  ? Zinc 50 MG TABS Take 50 mg by mouth daily.    ? ?No current facility-administered medications for this visit.  ? ? ?Allergies  ?Allergen Reactions  ? Atorvastatin   ?  Myalgias / leg pain and weakness  ? Penicillins Hives and Itching  ?  Has patient had a PCN reaction causing immediate rash, facial/tongue/throat swelling, SOB or lightheadedness with hypotension:Yes ?Has patient had a PCN reaction causing severe rash involving mucus membranes or skin necrosis:No ?Has patient had a PCN reaction that required hospitalization:No ?Has patient had a PCN reaction occurring within the last 10 years:No ?If all of the above answers are "NO", then may proceed with Cephalosporin use. ?  ? Verapamil   ?  Junctional rhythm  ? Influenza Vaccines Rash  ?  tinnitus  ? ? ? ? ?Review of Systems negative except from HPI and PMH ? ?Physical Exam ?BP (!) 100/52   Pulse 65   Ht 6' (1.829 m)   Wt 203 lb (92.1 kg)   SpO2 98%   BMI 27.53 kg/m?  ?Well developed and well nourished in no acute distress ?HENT normal ?Neck supple with JVP-7-8 cm ?Clear ?Device pocket well healed; without hematoma or erythema.  There is no tethering  ?Regular rate and rhythm, no  murmur ?Abd-soft with active BS ?No Clubbing cyanosis   2+ edema ?Skin-warm and dry ?A & Oriented  Grossly normal sensory and motor function ? ?ECG   ?Atrial fibrillation with ventricular pacing   ? ? ?Assessment and  Plan ? ?Atrial fibrillation-persistent slow ventricular response ? ?Dyspnea on exertion  ? ?First-degree AV block ? ?Congestive heart failure systolic/diastolic/acute on chronic ? ?Pacemaker  ? ?Renal dysfunction grade 4 ?Estimated Creatinine Clearance: 27.1 mL/min (A) (by C-G formula based on SCr of 2.19 mg/dL (H)). ? ?  ?Patient has interval deterioration of LV function.  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  Resynchronization to be associated with significant interval improvement in LV function.  His renal function is concerning in terms of access and we discussed that we would approach access without contrast but we might need help with this.  We also discussed the fact that coronary sinus pacing frequently requires some degree of contrast in these exposures put his kidneys at risk.  We could also attempt left bundle branch area pacing as a contrast free alternative.  We reviewed further the risks and benefits including but not limited to perforation infection and bleeding.  He and his daughter understand these  risks and are willing to proceed.  His apixaban will be held for 24 hours prior to the procedure ? ? ? ?Current medicines are reviewed at length with the patient today .  The patient does not  have concerns regarding medicines. ? ?

## 2021-05-26 LAB — CBC
Hematocrit: 36.4 % — ABNORMAL LOW (ref 37.5–51.0)
Hemoglobin: 11.8 g/dL — ABNORMAL LOW (ref 13.0–17.7)
MCH: 27.5 pg (ref 26.6–33.0)
MCHC: 32.4 g/dL (ref 31.5–35.7)
MCV: 85 fL (ref 79–97)
Platelets: 142 10*3/uL — ABNORMAL LOW (ref 150–450)
RBC: 4.29 x10E6/uL (ref 4.14–5.80)
RDW: 14.3 % (ref 11.6–15.4)
WBC: 6.3 10*3/uL (ref 3.4–10.8)

## 2021-05-26 LAB — BASIC METABOLIC PANEL
BUN/Creatinine Ratio: 22 (ref 10–24)
BUN: 45 mg/dL — ABNORMAL HIGH (ref 8–27)
CO2: 23 mmol/L (ref 20–29)
Calcium: 9 mg/dL (ref 8.6–10.2)
Chloride: 100 mmol/L (ref 96–106)
Creatinine, Ser: 2.06 mg/dL — ABNORMAL HIGH (ref 0.76–1.27)
Glucose: 140 mg/dL — ABNORMAL HIGH (ref 70–99)
Potassium: 3.7 mmol/L (ref 3.5–5.2)
Sodium: 143 mmol/L (ref 134–144)
eGFR: 31 mL/min/{1.73_m2} — ABNORMAL LOW (ref >59)

## 2021-05-28 ENCOUNTER — Other Ambulatory Visit: Payer: Self-pay | Admitting: Family Medicine

## 2021-05-28 ENCOUNTER — Ambulatory Visit: Payer: PPO | Admitting: Student

## 2021-05-28 ENCOUNTER — Other Ambulatory Visit: Payer: Self-pay

## 2021-05-28 ENCOUNTER — Encounter: Payer: Self-pay | Admitting: Student

## 2021-05-28 VITALS — BP 124/62 | HR 77 | Ht 72.0 in | Wt 203.0 lb

## 2021-05-28 DIAGNOSIS — E118 Type 2 diabetes mellitus with unspecified complications: Secondary | ICD-10-CM | POA: Diagnosis not present

## 2021-05-28 DIAGNOSIS — E785 Hyperlipidemia, unspecified: Secondary | ICD-10-CM | POA: Diagnosis not present

## 2021-05-28 DIAGNOSIS — I10 Essential (primary) hypertension: Secondary | ICD-10-CM

## 2021-05-28 DIAGNOSIS — N183 Chronic kidney disease, stage 3 unspecified: Secondary | ICD-10-CM | POA: Diagnosis not present

## 2021-05-28 DIAGNOSIS — I5042 Chronic combined systolic (congestive) and diastolic (congestive) heart failure: Secondary | ICD-10-CM

## 2021-05-28 DIAGNOSIS — R001 Bradycardia, unspecified: Secondary | ICD-10-CM | POA: Diagnosis not present

## 2021-05-28 DIAGNOSIS — I4821 Permanent atrial fibrillation: Secondary | ICD-10-CM | POA: Diagnosis not present

## 2021-05-28 NOTE — Patient Instructions (Signed)
Medication Instructions:  ?No Changes ?*If you need a refill on your cardiac medications before your next appointment, please call your pharmacy* ? ? ?Lab Work: ?No Labs ?If you have labs (blood work) drawn today and your tests are completely normal, you will receive your results only by: ?MyChart Message (if you have MyChart) OR ?A paper copy in the mail ?If you have any lab test that is abnormal or we need to change your treatment, we will call you to review the results. ? ? ?Testing/Procedures: ?No Testing ? ? ?Follow-Up: ?At [facility], you and your health needs are our priority.  As part of our continuing mission to provide you with exceptional heart care, we have created designated Provider Care Teams.  These Care Teams include your primary Cardiologist (physician) and Advanced Practice Providers (APPs -  Physician Assistants and Nurse Practitioners) who all work together to provide you with the care you need, when you need it. ? ?We recommend signing up for the patient portal called "MyChart".  Sign up information is provided on this After Visit Summary.  MyChart is used to connect with patients for Virtual Visits (Telemedicine).  Patients are able to view lab/test results, encounter notes, upcoming appointments, etc.  Non-urgent messages can be sent to your provider as well.   ?To learn more about what you can do with MyChart, go to [email or web address].   ? ?Your next appointment:   ?4 month(s) ? ?The format for your next appointment:   ?In Person ? ?Provider:   ?Pixie Casino, MD   ? ? ?  ?

## 2021-05-29 ENCOUNTER — Ambulatory Visit (INDEPENDENT_AMBULATORY_CARE_PROVIDER_SITE_OTHER): Payer: PPO | Admitting: Pharmacist

## 2021-05-29 DIAGNOSIS — I4819 Other persistent atrial fibrillation: Secondary | ICD-10-CM

## 2021-05-29 DIAGNOSIS — E785 Hyperlipidemia, unspecified: Secondary | ICD-10-CM

## 2021-05-29 DIAGNOSIS — E0822 Diabetes mellitus due to underlying condition with diabetic chronic kidney disease: Secondary | ICD-10-CM

## 2021-05-29 DIAGNOSIS — I5032 Chronic diastolic (congestive) heart failure: Secondary | ICD-10-CM

## 2021-05-29 DIAGNOSIS — N183 Chronic kidney disease, stage 3 unspecified: Secondary | ICD-10-CM

## 2021-05-29 DIAGNOSIS — I10 Essential (primary) hypertension: Secondary | ICD-10-CM

## 2021-05-29 NOTE — Chronic Care Management (AMB) (Signed)
? ? ?Chronic Care Management ?Pharmacy Note ? ?05/29/2021 ?Name:  Vincent Allen MRN:  [id number] DOB:  1933-12-11 ? ?Summary:  ?Patient is still taking metoprolol 41m daily - Dr KAlvy Bimlernotes showed that metoprolol was discontinued and is not on med list but patient reports he is still taking and CSande Rives NP mentions to continue metoprolol in her notes from 05/28/2021. Will consult with cardiology office for clarification.  ?Reminded to check weight and blood glucose daily; Check blood pressure 2 to 3 times per week.  ?Notify our office you if you have reading blood glucose <80 or >250. ?Mailed applications for Jardiance and Januvia medication assistance program to patient to complete.  He will return to our office after he complete patient portion. ? ?Subjective: ?Vincent HILLOCKis an 88y.o. year old male who is a primary patient of BMosie Lukes MD.  The CCM team was consulted for assistance with disease management and care coordination needs.   ? ?Engaged with patient and son by telephone for follow up visit in response to provider referral for pharmacy case management and/or care coordination services.  ? ?Consent to Services:  ?The patient was given information about Chronic Care Management services, agreed to services, and gave verbal consent prior to initiation of services.  Please see initial visit note for detailed documentation.  ? ?Patient Care Team: ?BMosie Lukes MD as PCP - General (Family Medicine) ?HPixie Casino MD as PCP - Cardiology (Cardiology) ?KDeboraha Sprang MD as PCP - Electrophysiology (Cardiology) ?GClent Jacks MD as Consulting Physician (Ophthalmology) ?JDanella Sensing MD as Consulting Physician (Dermatology) ?NDorothy Spark MD as Consulting Physician (Cardiology) ?WGarald Balding MD as Consulting Physician (Orthopedic Surgery) ?RWallene Huh DPM as Consulting Physician (Podiatry) ?ECherre Robins RPH-CPP (Pharmacist) ? ?Recent Office Visits:   ?05/14/2021 - Fam Med (Olevia Bowens NP) Seen for hospital follow up. No med changes noted. Checked CBC and CMP - Scr was up a little at 2.19 and eGFR was 26.75. ?03/23/2021 - Fam Med (Dr BCharlett Blake F/U diabetes; patient experience 2 hypoglycemia events recently. Lowered dose of glipizide to 520mtwice a day. Labs checked - showed worsening renal function. Stopped triamterene/HCTZ. rechecked renal funciton in 1 week. Referral to nephrology. ?01/25/2021 - Fam Med (Dr BlCharlett BlakeVideo Visit for right leg pain and swelling / HTN. Recommended compression hose; increase furosemide to 2 tablets for 3 days, then go back to 1 tablet daily. Ordered ultrasound to r/o DVT.  Ultrasound showed no DVT in right leg.  ? ?Recent Consults:  ?05/28/2021 - [facility]. No med changes noted.  ?05/24/2021 - Cardio (Dr KlCaryl ComesF/U CHF and afib with pacemaker. Discussed CRTP implantation. Patient to hold ELiquis prior to procedure. Discontinued metoprolol (for now) due to low BP. ?05/11/2021 - Cardio (Transition [facility] Care Team - [facility]. No medication changes noted.  ?05/10/2021 - Cardio (Dr HiDebara PickettF/U afib and CHF. Started tamsulosin 0.38m41mightly for BPH. Labs checked.  ?04/20/2021 - Nephrology ([place]) no notes available. Patient sent to hospital due to suspected CHF exacerbation. ?02/26/2021 - Podiatry (Dr GalElisha Pondernnual Diabetic foot exam ?02/01/2021 - [facility]ophronia Simasrgery (Dr WhiDurward [place] for right knee pain. Noted to have osteoarthritis of right knee and popliteal cyst per ultrasound. No intervention at this time as patient denied any symptoms. ? ?Hospital visits: ? [facility]20[facility]2021 [facility]  ?Medications stated during hospitalization: Jardiance 64m52mily, potsssium 20  mEq -  2 tablets daily and spironolactone 12.22m daily. ?Medications stopped during hospitalization: silodosin 474m(Rapaflo), amlodipine 32m96mcalcium carbonate, finasterine  32mg332mrimaterene-HCTZ 75-50mg36mtamin D ?Medications changed during hospitalization: metoprolol decreased from 232mg 34me a day to 232mg o56ma day. Furosemide increased form 20 to 40mg da37mo 80mg dai57m ? ?Objective: ? ?Lab Results  ?Component Value Date  ? CREATININE 2.06 (H) 05/24/2021  ? CREATININE 2.19 (H) 05/14/2021  ? CREATININE 2.23 (H) 05/09/2021  ? ? ?Lab Results  ?Component Value Date  ? HGBA1C 5.8 04/02/2021  ? ?Last diabetic Eye exam:  ?Lab Results  ?Component Value Date/Time  ? HMDIABEYEEXA No Retinopathy 05/26/2019 12:00 AM  ?  ?Last diabetic Foot exam: No results found for: HMDIABFOOTEX  ? ?   ?Component Value Date/Time  ? CHOL 125 04/02/2021 1456  ? TRIG 84.0 04/02/2021 1456  ? HDL 31.30 (L) 04/02/2021 1456  ? CHOLHDL 4 04/02/2021 1456  ? VLDL 16.8 04/02/2021 1456  ? LDLCALC 7[place]/2023 1456  ? LDLCALC 101 (H) 12/16/2019 1543  ? LDLDIRECT 65.0 11/03/2018 1038  ? ? ?Hepatic Function Latest Ref Rng & Units 04/20/2021 04/09/2021 04/02/2021  ?Total Protein 6.5 - 8.1 g/dL 6.5 6.5 6.9  ?Albumin 3.5 - 5.0 g/dL 2.6(L) 3.3(L) 3.6  ?AST 15 - 41 U/L '23 22 28  ' ?ALT 0 - 44 U/L '17 18 16  ' ?Alk Phosphatase 38 - 126 U/L 65 84 77  ?Total Bilirubin 0.3 - 1.2 mg/dL 1.4(H) 1.6(H) 1.8(H)  ?Bilirubin, Direct 0.0 - 0.2 mg/dL 0.5(H) - -  ? ? ?Lab Results  ?Component Value Date/Time  ? TSH 4.58 04/02/2021 02:56 PM  ? TSH 4.55 11/30/2020 04:00 PM  ? FREET4 1.01 08/11/2020 04:25 PM  ? ? ?CBC Latest Ref Rng & Units 05/24/2021 05/14/2021 05/03/2021  ?WBC 3.4 - 10.8 x10E3/uL 6.3 5.6 6.7  ?Hemoglobin 13.0 - 17.7 g/dL 11.8(L) 11.4(L) 11.4(L)  ?Hematocrit 37.5 - 51.0 % 36.4(L) 35.7(L) 35.3(L)  ?Platelets 150 - 450 x10E3/uL 142(L) 183.0 221  ? ? ?Lab Results  ?Component Value Date/Time  ? VD25OH 76.09 08/10/2020 03:13 PM  ? VD25OH 74.75 04/20/2020 11:06 AM  ? ? ?Clinical ASCVD: Yes  ?The ASCVD Risk score (Arnett DK, et al., 2019) failed to calculate for the following reasons: ?  The 2019 ASCVD risk score is only valid for ages 40 to 79 37 ? ?40Social History  ? ?Tobacco Use  ?Smoking Status Never  ?Smokeless Tobacco Never  ? ?BP Readings from Last 3 Encounters:  ?05/28/21 124/62  ?05/24/21 (!) 100/52  ?05/14/21 120/61  ? ?Pulse Readings from Last 3 Encounters:  ?05/28/21 77  ?05/24/21 65  ?05/14/21 78  ? ?Wt Readings from Last 3 Encounters:  ?05/28/21 203 lb (92.1 kg)  ?05/24/21 203 lb (92.1 kg)  ?05/14/21 202 lb (91.6 kg)  ? ? ?Assessment: Review of patient past medical history, allergies, medications, health status, including review of consultants reports, laboratory and other test data, was performed as part of comprehensive evaluation and provision of chronic care management services.  ? ?SDOH:  (Social Determinants of Health) assessments and interventions performed:  ? ? ? ? ?CCM Care [place]  ?Allergen Reactions  ? Atorvastatin   ?  Myalgias / leg pain and weakness  ? Penicillins Hives and Itching  ?  Has patient had a PCN reaction causing immediate rash, facial/tongue/throat swelling, SOB or lightheadedness with hypotension:Yes ?Has patient had a PCN reaction causing severe rash involving mucus membranes or  skin necrosis:No ?Has patient had a PCN reaction that required hospitalization:No ?Has patient had a PCN reaction occurring within the last 10 years:No ?If all of the above answers are "NO", then may proceed with Cephalosporin use. ?  ? Verapamil   ?  Junctional rhythm  ? Influenza Vaccines Rash  ?  tinnitus  ? ? ?Medications Reviewed Today   ? ? Reviewed by Cherre Robins, RPH-CPP (Pharmacist) on 05/29/21 at 1042  Med List Status: <None>  ? ?Medication Order Taking? Sig Documenting Provider Last Dose Status Informant  ?acetaminophen (TYLENOL) 500 MG tablet [id number] Yes Take 500 mg by mouth every 6 (six) hours as needed for moderate pain. [provider] Taking Active Child  ?ELIQUIS 2.5 MG TABS tablet [id number] Yes TAKE 1 TABLET BY MOUTH TWICE A DAY [name], MD Taking Active   ?empagliflozin (JARDIANCE) 10 MG  TABS tablet [id number] Yes Take 1 tablet (10 mg total) by mouth daily. Domenic Polite, MD Taking Active   ?furosemide (LASIX) 40 MG tablet [id number] Yes Take 2 tablets (80 mg total) by mouth daily. Domenic Polite

## 2021-05-29 NOTE — Patient Instructions (Addendum)
[name] ?It was a pleasure speaking with you today.  ?I have attached a summary of our visit today and information about your health goals.  ? ?Our next appointment is by telephone on Jul 24, 2021 at 11:00am ? ?Please call the care guide team at [phone] if you need to cancel or reschedule your appointment.  ? ?If you have any questions or concerns, please feel free to contact me either at the phone number below or with a MyChart message.  ? ? ?Cherre Robins, PharmD ?Clinical Pharmacist ?[facility] ?[place] ?[phone] (direct line)  ?[phone] (main office number) ? ? ?Chronic Care Management  CARE PLAN  ? ?  ?Hypertension / High Blood pressure ?BP Readings from Last 3 Encounters:  ?05/28/21 124/62  ?05/24/21 (!) 100/52  ?05/14/21 120/61  ? ?Pharmacist Clinical Goal(s): ?Over the next 90 days, patient will work with PharmD and providers to maintain BP goal <140/90 ?Current regimen:  ?Metoprolol succinate '25mg'$  daily ?Interventions: ?Continue current hypertension medication regimen.  ?Reviewed blood pressure goal ?Recommend check blood pressure 2 to 3 times per week and record ?We are checking with cardiology office to verify if you should continue metoprolol or not ? ? ?Congestive Heart Disease:  ?Goal: prevention of worsening of heart disease and minimize symptoms of congestive heart diseaes ?Current Regimen:  ?Furosemide '40mg'$  - take 2 tablets = '80mg'$  daily each morning  ?Metoprolol succinate ER '25mg'$  once a day  ?Jardiance '10mg'$  daily    ?Interventions:  ?Discussed signs and symptoms of CHF exacerbation - weight gain, SOB, abdominal fullness, swelling in legs or abdomen, Fatigue and weakness, changes in ability to perform usual activities, persistent cough or wheezing with white or pink blood-tinged mucus, nausea and lack of appetite ?Continue to weigh daily - report weight gain of more than 3 lbs in 24 hours or 5 lbs in 1 week. ?Mailed patient assistance program application for  Jardiance  ? ?Hyperlipidemia ?Lab Results  ?Component Value Date/Time  ? LDLCALC 77 04/02/2021 02:56 PM  ? LDLCALC 101 (H) 12/16/2019 03:43 PM  ? LDLDIRECT 65.0 11/03/2018 10:38 AM  ? ?Pharmacist Clinical Goal(s): ?Over the next 90 days, patient will work with PharmD and providers to achieve LDL goal < 70 ?Current regimen:  ?Diet and exercise management   ?Krill Oil '1000mg'$  daily ?Interventions: ?Discussed past experiences with atorvastatin - caused leg pain and weakness ?Recommend limiting intake of food high in saturated fat / avoid fried foods; increase intake of vegetables ?Patient self care activities - Over the next 90 days, patient will: ?Achieve LDL < 70 ?Continue Krill Oil ? ?Diabetes ?Lab Results  ?Component Value Date/Time  ? HGBA1C 5.8 04/02/2021 02:56 PM  ? HGBA1C 6.3 11/30/2020 04:00 PM  ? ?Pharmacist Clinical Goal(s): ?Over the next 90 days, patient will work with PharmD and providers to maintain A1c goal <7.5% ?Current regimen:  ?Januvia '50mg'$  0.5 tablet twice a day ?Glipizide '10mg'$  - take 0.5 talbet = '5mg'$  twice a day with morning and evening meals.  ?Jardiance '10mg'$  daily ?Interventions: ?Reviewed medication dose and renal funciton  ?Recommended checking blood glucose / sugar daily ?Reviewed home blood glucose readings and reviewed goals  ?Fasting blood glucose goal (before meals) = 80 to 130 ?Blood glucose goal after a meal = less than 180  ?Patient self care activities - Over the next 90 days, patient will:   ?Continue to check blood glucose / sugar 1 to 2 times per day. Notify Dr Frederik Pear office if experience blood glucose <  80 ?How to Treat a Low Glucose Level:  ?If you have a low blood glucose less than 70, please eat / drink 15 grams of carbohydrates (4 oz of juice, soda, 4 glucose tablets, or 3-4 pieces of hard candy).  It is best so choose a "quick" source of sugar that does not contain fat (chocolate and peanut butter might take longer to increase your blood glucose) ?Wait 15 minutes and then  recheck your blood glucose. If your blood glucose is still less than 70, eat another 15 grams of carbohydrates.  ?Wait another 15 minutes and recheck your glucose.  ?Continue this until your blood glucose is over 70. Once you blood glucose is over 70, eat a snack with protein in it to prevent your blood glucose from dropping again. ?Reviewed home blood glucose readings and reviewed goals  ?Fasting blood glucose goal (before meals) = 80 to 130 ?Blood glucose goal after a meal = less than 180  ? ?Atrial fibrillation  ?Pharmacist Clinical Goal(s) ?Over the next 90 days, patient will work with PharmD and providers to reduce complications associated with atrial fibrillation ?Current regimen:  ?Eliquis 2.'5mg'$  twice daily in morning and with dinner ?Metoprolol succinate '25mg'$  daily ?Interventions: ?Maintain current atrial fibrillation medication regimen and plan to follow up with Dr Caryl Comes regarding pacemaker ?Will continue to monitor in 2023 for patient assistance program needs regarding Eliquis ? ?Decreased Kidney Function:  ?Pharmacist Clinical Goal(s) ?Over the next 90 days, patient will work with PharmD and providers to slow / prevent further decrease in kidney function ?Current regimen:  ?Jardiance '10mg'$  daily ?Interventions: ?Reviewed medications for adjustment based on kidney funciton ?Patient self care activities - Over the next 90 days, patient will: ?Avoid over the counter pain relievers like Aleve / naproxen; Motrin / ibuprofen. ?If you need anything for pain relief, headaches or fever - Tylenol / acetaminophen '500mg'$  up to 2 tablets every 8 hours is safer option.  ?Follow up with [facility] 06/06/2021 ? ?Medication management ?Pharmacist Clinical Goal(s): ?Over the next 90 days, patient will work with PharmD and providers to achieve optimal medication adherence ?Current pharmacy: [facility] ?Interventions ?Comprehensive medication review performed. ?Continue current medication management strategy ?Patient  self care activities - Over the next 90 days, patient will: ?Focus on medication adherence by filling and taking medications appropriately  ?Take medications as prescribed ?Report any questions or concerns to PharmD and/or provider(s) ?I have mailed applications for Jardiance and Januvia patient assistance. Please complete and return to me at [facility].  ? ? ? ?Patient verbalizes understanding of instructions and care plan provided today and agrees to view in [place]. Active MyChart status confirmed with patient.    ?

## 2021-05-30 ENCOUNTER — Telehealth: Payer: Self-pay | Admitting: Pharmacist

## 2021-05-30 ENCOUNTER — Ambulatory Visit: Payer: PPO | Admitting: Podiatry

## 2021-05-30 NOTE — Telephone Encounter (Signed)
Left detailed message for patient's son that [name] is to continue to take metoprolol ER '25mg'$  daily. Monitor blood pressure and contact office if SBP < 100 or DBP < 50 or if he has any symptoms of low blood glucose - lightheadedness / dizziness, blurry vision, tiredness or unsteadiness.  ?

## 2021-05-30 NOTE — Telephone Encounter (Signed)
-----   Message from Darreld Mclean, [place] sent at 05/29/2021  5:18 PM EDT ----- ?Hi Tiffanye Hartmann, ? ?Thank you for clarifying. Yes, I would like him to continue to take it given his history of chronic combined CHF and the fact that he is not having any symptoms of hypotension. However, I did advise patient to let us know if systolic BP dropped below 100. ? ?Thank you! ?Callie  ?----- Message ----- ?From: Cherre Robins, RPH-CPP ?Sent: 05/29/2021   4:42 PM EDT ?To: Deboraha Sprang, MD, Darreld Mclean, PA-C ? ?Patient is still taking metoprolol '25mg'$  daily. It was removed from med list 05/24/2021 at appointment with Dr Caryl Comes but Annamary Carolin mentioned to continue in her OV note 05/28/2021. Patient is still taking so I added back to med list but wanted to verify that he should be taking metoprolol. Home blood pressure readings have been SBP 100 to 110 and DBP 55 to 65 and HR 60's.  ? ?

## 2021-06-05 ENCOUNTER — Other Ambulatory Visit: Payer: Self-pay | Admitting: Family Medicine

## 2021-06-06 ENCOUNTER — Other Ambulatory Visit: Payer: Self-pay | Admitting: Family Medicine

## 2021-06-06 DIAGNOSIS — N189 Chronic kidney disease, unspecified: Secondary | ICD-10-CM | POA: Diagnosis not present

## 2021-06-06 DIAGNOSIS — N179 Acute kidney failure, unspecified: Secondary | ICD-10-CM | POA: Diagnosis not present

## 2021-06-06 DIAGNOSIS — Z950 Presence of cardiac pacemaker: Secondary | ICD-10-CM | POA: Diagnosis not present

## 2021-06-06 DIAGNOSIS — I4891 Unspecified atrial fibrillation: Secondary | ICD-10-CM | POA: Diagnosis not present

## 2021-06-06 DIAGNOSIS — I129 Hypertensive chronic kidney disease with stage 1 through stage 4 chronic kidney disease, or unspecified chronic kidney disease: Secondary | ICD-10-CM | POA: Diagnosis not present

## 2021-06-06 DIAGNOSIS — E1129 Type 2 diabetes mellitus with other diabetic kidney complication: Secondary | ICD-10-CM | POA: Diagnosis not present

## 2021-06-06 DIAGNOSIS — I509 Heart failure, unspecified: Secondary | ICD-10-CM | POA: Diagnosis not present

## 2021-06-15 DIAGNOSIS — E0822 Diabetes mellitus due to underlying condition with diabetic chronic kidney disease: Secondary | ICD-10-CM | POA: Diagnosis not present

## 2021-06-15 DIAGNOSIS — I10 Essential (primary) hypertension: Secondary | ICD-10-CM

## 2021-06-15 DIAGNOSIS — E785 Hyperlipidemia, unspecified: Secondary | ICD-10-CM | POA: Diagnosis not present

## 2021-06-15 DIAGNOSIS — I4819 Other persistent atrial fibrillation: Secondary | ICD-10-CM | POA: Diagnosis not present

## 2021-06-15 DIAGNOSIS — I5032 Chronic diastolic (congestive) heart failure: Secondary | ICD-10-CM

## 2021-06-15 DIAGNOSIS — N183 Chronic kidney disease, stage 3 unspecified: Secondary | ICD-10-CM | POA: Diagnosis not present

## 2021-06-18 NOTE — Progress Notes (Deleted)
? ?Subjective:  ? ? Patient ID: Vincent Allen, male    DOB: Aug 04, 1933, 88 y.o.   MRN: [id number] ? ?No chief complaint on file. ? ? ?HPI ?Patient is in today for a follow up. ? ?Past Medical History:  ?Diagnosis Date  ? Arthritis   ? Bradycardia   ? Cataract   ? Chronic kidney disease stage III (GFR 30-59 ml/min) 05/03/2012  ? Colon polyps   ? Complication of anesthesia   ? emesis  ? Diabetes mellitus   ? GERD (gastroesophageal reflux disease)   ? HOH (hard of hearing)   ? HTN (hypertension) 01/13/2013  ? Hyperlipidemia   ? Hypertension   ? Pacemaker 12/2016  ? Persistent atrial fibrillation ([place])   ? Presence of permanent cardiac pacemaker 09/20/2016  ? Symptomatic bradycardia   ? Vitamin D deficiency 07/17/2015  ? ? ?Past Surgical History:  ?Procedure Laterality Date  ? ANKLE ARTHROPLASTY    ? CARDIOVERSION N/A 08/20/2016  ? Procedure: CARDIOVERSION;  Surgeon: Sanda Klein, MD;  Location: [facility];  Service: Cardiovascular;  Laterality: N/A;  ? CATARACT EXTRACTION  2010, 2014  ? CATARACT EXTRACTION  02/28/14  ? CHOLECYSTECTOMY    ? CHOLECYSTECTOMY N/A 05/03/2012  ? Procedure: LAPAROSCOPIC CHOLECYSTECTOMY WITH INTRAOPERATIVE CHOLANGIOGRAM;  Surgeon: [name], MD;  Location: [facility];  Service: General;  Laterality: N/A;  ? INSERT / REPLACE / REMOVE PACEMAKER  09/20/2016  ? PACEMAKER IMPLANT N/A 09/20/2016  ? Procedure: Pacemaker Implant;  Surgeon: Deboraha Sprang, MD;  Location: [facility];  Service: Cardiovascular;  Laterality: N/A;  ? ROTATOR CUFF REPAIR    ? SHOULDER SURGERY    ? TONSILLECTOMY    ? ? ?Family History  ?Problem Relation Age of Onset  ? Cancer Mother   ?     colon, breast, pancreas, skin cancer  ? Heart disease Father   ?     heart valve replaced  ? Stroke Father   ? Diabetes Father   ? Cancer Paternal Grandmother   ?     colon  ? Obesity Son   ? Heart disease Son   ?     bradycardia  ? ? ?Social History  ? ?Socioeconomic History  ? Marital status: Widowed  ?  Spouse name: Not on file   ? Number of children: Not on file  ? Years of education: Not on file  ? Highest education level: Not on file  ?Occupational History  ? Not on file  ?Tobacco Use  ? Smoking status: Never  ? Smokeless tobacco: Never  ?Vaping Use  ? Vaping Use: Never used  ?Substance and Sexual Activity  ? Alcohol use: Yes  ?  Alcohol/week: 1.0 standard drink  ?  Types: 1 Glasses of wine per week  ? Drug use: No  ? Sexual activity: Not on file  ?  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.  ?Other Topics Concern  ? Not on file  ?Social History Narrative  ? Not on file  ? ?Social Determinants of Health  ? ?Financial Resource Strain: Medium Risk  ? Difficulty of Paying Living Expenses: Somewhat hard  ?Food Insecurity: No Food Insecurity  ? Worried About Charity fundraiser in the Last Year: Never true  ? Ran Out of Food in the Last Year: Never true  ?Transportation Needs: No Transportation Needs  ? Lack of Transportation (Medical): No  ? Lack of Transportation (Non-Medical):  No  ?Physical Activity: Insufficiently Active  ? Days of Exercise per Week: 7 days  ? Minutes of Exercise per Session: 10 min  ?Stress: No Stress Concern Present  ? Feeling of Stress : Not at all  ?Social Connections: Socially Isolated  ? Frequency of Communication with Friends and Family: More than three times a week  ? Frequency of Social Gatherings with Friends and Family: Three times a week  ? Attends Religious Services: Never  ? Active Member of Clubs or Organizations: No  ? Attends Archivist Meetings: Never  ? Marital Status: Widowed  ?Intimate Partner Violence: Not At Risk  ? Fear of Current or Ex-Partner: No  ? Emotionally Abused: No  ? Physically Abused: No  ? Sexually Abused: No  ? ? ?Outpatient Medications Prior to Visit  ?Medication Sig Dispense Refill  ? acetaminophen (TYLENOL) 500 MG tablet Take 500 mg by mouth every 6 (six) hours as needed for moderate pain.    ? ELIQUIS  2.5 MG TABS tablet TAKE 1 TABLET BY MOUTH TWICE A DAY 60 tablet 5  ? furosemide (LASIX) 40 MG tablet TAKE 2 TABLETS BY MOUTH EVERY DAY 60 tablet 1  ? glipiZIDE (GLUCOTROL) 10 MG tablet Take 0.5 tablets (5 mg total) by mouth 2 (two) times daily before a meal. 360 tablet 1  ? glucose blood (ONE TOUCH ULTRA TEST) test strip USE 1 STRIP 2 TIMES DAILY TO CHECK BLOOD SUGAR DX E08.22, N18.3 300 each 1  ? JARDIANCE 10 MG TABS tablet TAKE 1 TABLET BY MOUTH EVERY DAY 30 tablet 0  ? KLOR-CON M20 20 MEQ tablet TAKE 2 TABLETS BY MOUTH DAILY 180 tablet 1  ? Krill Oil 1000 MG CAPS Take 1,000 mg by mouth daily.    ? metoprolol succinate (TOPROL-XL) 25 MG 24 hr tablet Take 25 mg by mouth daily.    ? Multiple Vitamin (MULTIVITAMIN WITH MINERALS) TABS tablet Take 1 tablet by mouth daily.    ? OVER THE COUNTER MEDICATION Take 1 capsule by mouth 2 (two) times daily. Glucocil (Patient not taking: Reported on 05/29/2021)    ? sitaGLIPtin (JANUVIA) 25 MG tablet Take 1 tablet (25 mg total) by mouth daily. 30 tablet 2  ? tamsulosin (FLOMAX) 0.4 MG CAPS capsule Take 1 capsule (0.4 mg total) by mouth at bedtime. 90 capsule 3  ? Zinc 50 MG TABS Take 50 mg by mouth daily.    ? ?No facility-administered medications prior to visit.  ? ? ?Allergies  ?Allergen Reactions  ? Atorvastatin   ?  Myalgias / leg pain and weakness  ? Penicillins Hives and Itching  ?  Has patient had a PCN reaction causing immediate rash, facial/tongue/throat swelling, SOB or lightheadedness with hypotension:Yes ?Has patient had a PCN reaction causing severe rash involving mucus membranes or skin necrosis:No ?Has patient had a PCN reaction that required hospitalization:No ?Has patient had a PCN reaction occurring within the last 10 years:No ?If all of the above answers are "NO", then may proceed with Cephalosporin use. ?  ? Verapamil   ?  Junctional rhythm  ? Influenza Vaccines Rash  ?  tinnitus  ? ? ?ROS ? ?   ?Objective:  ?  ?Physical Exam ? ?There were no vitals taken for  this visit. ?Wt Readings from Last 3 Encounters:  ?05/28/21 203 lb (92.1 kg)  ?05/24/21 203 lb (92.1 kg)  ?05/14/21 202 lb (91.6 kg)  ? ? ?Diabetic Foot Exam - Simple   ?No data filed ?  ? ?  Lab Results  ?Component Value Date  ? WBC 6.3 05/24/2021  ? HGB 11.8 (L) 05/24/2021  ? HCT 36.4 (L) 05/24/2021  ? PLT 142 (L) 05/24/2021  ? GLUCOSE 140 (H) 05/24/2021  ? CHOL 125 04/02/2021  ? TRIG 84.0 04/02/2021  ? HDL 31.30 (L) 04/02/2021  ? LDLDIRECT 65.0 11/03/2018  ? [place] 77 04/02/2021  ? ALT 17 04/20/2021  ? AST 23 04/20/2021  ? NA 143 05/24/2021  ? K 3.7 05/24/2021  ? CL 100 05/24/2021  ? CREATININE 2.06 (H) 05/24/2021  ? BUN 45 (H) 05/24/2021  ? CO2 23 05/24/2021  ? TSH 4.58 04/02/2021  ? PSA 1.18 10/07/2012  ? INR 1.0 08/15/2016  ? HGBA1C 5.8 04/02/2021  ? MICROALBUR 26.7 (H) 07/15/2016  ? ? ?Lab Results  ?Component Value Date  ? TSH 4.58 04/02/2021  ? ?Lab Results  ?Component Value Date  ? WBC 6.3 05/24/2021  ? HGB 11.8 (L) 05/24/2021  ? HCT 36.4 (L) 05/24/2021  ? MCV 85 05/24/2021  ? PLT 142 (L) 05/24/2021  ? ?Lab Results  ?Component Value Date  ? NA 143 05/24/2021  ? K 3.7 05/24/2021  ? CO2 23 05/24/2021  ? GLUCOSE 140 (H) 05/24/2021  ? BUN 45 (H) 05/24/2021  ? CREATININE 2.06 (H) 05/24/2021  ? BILITOT 1.4 (H) 04/20/2021  ? ALKPHOS 65 04/20/2021  ? AST 23 04/20/2021  ? ALT 17 04/20/2021  ? PROT 6.5 04/20/2021  ? ALBUMIN 2.6 (L) 04/20/2021  ? CALCIUM 9.0 05/24/2021  ? ANIONGAP 10 05/03/2021  ? EGFR 31 (L) 05/24/2021  ? GFR 26.74 (L) 05/14/2021  ? ?Lab Results  ?Component Value Date  ? CHOL 125 04/02/2021  ? ?Lab Results  ?Component Value Date  ? HDL 31.30 (L) 04/02/2021  ? ?Lab Results  ?Component Value Date  ? [place] 77 04/02/2021  ? ?Lab Results  ?Component Value Date  ? TRIG 84.0 04/02/2021  ? ?Lab Results  ?Component Value Date  ? CHOLHDL 4 04/02/2021  ? ?Lab Results  ?Component Value Date  ? HGBA1C 5.8 04/02/2021  ? ? ?   ?Assessment & Plan:  ? ?Problem List Items Addressed This Visit   ?None ? ? ?I am having  Vincent Allen "Vincent Allen" maintain his glucose blood, Zinc, Krill Oil, glipiZIDE, multivitamin with minerals, OVER THE COUNTER MEDICATION, acetaminophen, Eliquis, tamsulosin, sitaGLIPtin, metoprolol

## 2021-06-19 ENCOUNTER — Ambulatory Visit (INDEPENDENT_AMBULATORY_CARE_PROVIDER_SITE_OTHER): Payer: PPO | Admitting: Family Medicine

## 2021-06-19 ENCOUNTER — Encounter: Payer: Self-pay | Admitting: Family Medicine

## 2021-06-19 VITALS — BP 136/64 | HR 65 | Ht 72.0 in | Wt 202.1 lb

## 2021-06-19 DIAGNOSIS — R600 Localized edema: Secondary | ICD-10-CM | POA: Diagnosis not present

## 2021-06-19 DIAGNOSIS — I5032 Chronic diastolic (congestive) heart failure: Secondary | ICD-10-CM

## 2021-06-19 DIAGNOSIS — N183 Chronic kidney disease, stage 3 unspecified: Secondary | ICD-10-CM

## 2021-06-19 DIAGNOSIS — E0822 Diabetes mellitus due to underlying condition with diabetic chronic kidney disease: Secondary | ICD-10-CM

## 2021-06-19 DIAGNOSIS — I4821 Permanent atrial fibrillation: Secondary | ICD-10-CM

## 2021-06-19 DIAGNOSIS — Z0000 Encounter for general adult medical examination without abnormal findings: Secondary | ICD-10-CM

## 2021-06-19 DIAGNOSIS — E785 Hyperlipidemia, unspecified: Secondary | ICD-10-CM | POA: Diagnosis not present

## 2021-06-19 DIAGNOSIS — E559 Vitamin D deficiency, unspecified: Secondary | ICD-10-CM

## 2021-06-19 NOTE — Progress Notes (Signed)
? ?Subjective:  ? ?By signing my name below, I, Carylon Perches, attest that this documentation has been prepared under the direction and in the presence of [name] MD, 06/19/2021   ? ? Patient ID: Vincent Allen, male    DOB: 1934/03/11, 88 y.o.   MRN: [id number] ? ?Chief Complaint  ?Patient presents with  ? Follow-up  ? ? ?HPI ?Patient is in today for an office visit. He is accompanied with his son.  He is doing well.  ? ?He reports no recent febrile illness or hospitalizations. ? ?His leg swelling has been improving. He is currently wearing compression socks. He occasionally wears them while he is sleeping.  ? ?His blood sugar has been improving. His A1C has been well. He has been eating a steady diet. He has been drinking sugar free drinks. He drinks about 40 oz of water a day. ?Lab Results  ?Component Value Date  ? HGBA1C 5.8 04/02/2021  ? ?His weight is decreasing. ?Wt Readings from Last 3 Encounters:  ?06/19/21 202 lb 1.6 oz (91.7 kg)  ?05/28/21 203 lb (92.1 kg)  ?05/24/21 203 lb (92.1 kg)  ? ?He has been seeing a kidney specialist recently. He was reported to be doing well. He has been monitoring his salt intake. His diet has been adjusting to accompany his salt intake. ? ?He is scheduled to get his pacemaker updated. He has been regularly seeing a cardiologist. His energy has been improving.  ? ?He denies CP/palp/SOB/HA/congestion/fevers/GI c/o. Taking meds as prescribed  ? ?Past Medical History:  ?Diagnosis Date  ? Arthritis   ? Bradycardia   ? Cataract   ? Chronic kidney disease stage III (GFR 30-59 ml/min) 05/03/2012  ? Colon polyps   ? Complication of anesthesia   ? emesis  ? Diabetes mellitus   ? GERD (gastroesophageal reflux disease)   ? HOH (hard of hearing)   ? HTN (hypertension) 01/13/2013  ? Hyperlipidemia   ? Hypertension   ? Pacemaker 12/2016  ? Persistent atrial fibrillation ([place])   ? Presence of permanent cardiac pacemaker 09/20/2016  ? Symptomatic bradycardia   ? Vitamin D deficiency  07/17/2015  ? ? ?Past Surgical History:  ?Procedure Laterality Date  ? ANKLE ARTHROPLASTY    ? CARDIOVERSION N/A 08/20/2016  ? Procedure: CARDIOVERSION;  Surgeon: Sanda Klein, MD;  Location: [facility];  Service: Cardiovascular;  Laterality: N/A;  ? CATARACT EXTRACTION  2010, 2014  ? CATARACT EXTRACTION  02/28/14  ? CHOLECYSTECTOMY    ? CHOLECYSTECTOMY N/A 05/03/2012  ? Procedure: LAPAROSCOPIC CHOLECYSTECTOMY WITH INTRAOPERATIVE CHOLANGIOGRAM;  Surgeon: [name], MD;  Location: [facility];  Service: General;  Laterality: N/A;  ? INSERT / REPLACE / REMOVE PACEMAKER  09/20/2016  ? PACEMAKER IMPLANT N/A 09/20/2016  ? Procedure: Pacemaker Implant;  Surgeon: Deboraha Sprang, MD;  Location: [facility];  Service: Cardiovascular;  Laterality: N/A;  ? ROTATOR CUFF REPAIR    ? SHOULDER SURGERY    ? TONSILLECTOMY    ? ? ?Family History  ?Problem Relation Age of Onset  ? Cancer Mother   ?     colon, breast, pancreas, skin cancer  ? Heart disease Father   ?     heart valve replaced  ? Stroke Father   ? Diabetes Father   ? Cancer Paternal Grandmother   ?     colon  ? Obesity Son   ? Heart disease Son   ?     bradycardia  ? ? ?Social History  ? ?  Socioeconomic History  ? Marital status: Widowed  ?  Spouse name: Not on file  ? Number of children: Not on file  ? Years of education: Not on file  ? Highest education level: Not on file  ?Occupational History  ? Not on file  ?Tobacco Use  ? Smoking status: Never  ? Smokeless tobacco: Never  ?Vaping Use  ? Vaping Use: Never used  ?Substance and Sexual Activity  ? Alcohol use: Yes  ?  Alcohol/week: 1.0 standard drink  ?  Types: 1 Glasses of wine per week  ? Drug use: No  ? Sexual activity: Not on file  ?  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.  ?Other Topics Concern  ? Not on file  ?Social History Narrative  ? Not on file  ? ?Social Determinants of Health  ? ?Financial Resource Strain: Medium Risk  ?  Difficulty of Paying Living Expenses: Somewhat hard  ?Food Insecurity: No Food Insecurity  ? Worried About Charity fundraiser in the Last Year: Never true  ? Ran Out of Food in the Last Year: Never true  ?Transportation Needs: No Transportation Needs  ? Lack of Transportation (Medical): No  ? Lack of Transportation (Non-Medical): No  ?Physical Activity: Insufficiently Active  ? Days of Exercise per Week: 7 days  ? Minutes of Exercise per Session: 10 min  ?Stress: No Stress Concern Present  ? Feeling of Stress : Not at all  ?Social Connections: Socially Isolated  ? Frequency of Communication with Friends and Family: More than three times a week  ? Frequency of Social Gatherings with Friends and Family: Three times a week  ? Attends Religious Services: Never  ? Active Member of Clubs or Organizations: No  ? Attends Archivist Meetings: Never  ? Marital Status: Widowed  ?Intimate Partner Violence: Not At Risk  ? Fear of Current or Ex-Partner: No  ? Emotionally Abused: No  ? Physically Abused: No  ? Sexually Abused: No  ? ? ?Outpatient Medications Prior to Visit  ?Medication Sig Dispense Refill  ? acetaminophen (TYLENOL) 500 MG tablet Take 500 mg by mouth every 6 (six) hours as needed for moderate pain.    ? ELIQUIS 2.5 MG TABS tablet TAKE 1 TABLET BY MOUTH TWICE A DAY 60 tablet 5  ? furosemide (LASIX) 40 MG tablet TAKE 2 TABLETS BY MOUTH EVERY DAY 60 tablet 1  ? glipiZIDE (GLUCOTROL) 10 MG tablet Take 0.5 tablets (5 mg total) by mouth 2 (two) times daily before a meal. 360 tablet 1  ? glucose blood (ONE TOUCH ULTRA TEST) test strip USE 1 STRIP 2 TIMES DAILY TO CHECK BLOOD SUGAR DX E08.22, N18.3 300 each 1  ? JARDIANCE 10 MG TABS tablet TAKE 1 TABLET BY MOUTH EVERY DAY 30 tablet 0  ? Krill Oil 1000 MG CAPS Take 1,000 mg by mouth daily.    ? Multiple Vitamin (MULTIVITAMIN WITH MINERALS) TABS tablet Take 1 tablet by mouth daily.    ? sitaGLIPtin (JANUVIA) 25 MG tablet Take 1 tablet (25 mg total) by mouth daily.  (Patient taking differently: Take 12.5 mg by mouth daily.) 30 tablet 2  ? tamsulosin (FLOMAX) 0.4 MG CAPS capsule Take 1 capsule (0.4 mg total) by mouth at bedtime. 90 capsule 3  ? Zinc 50 MG TABS Take 50 mg by mouth daily.    ? KLOR-CON M20 20 MEQ tablet TAKE 2 TABLETS BY MOUTH DAILY  180 tablet 1  ? metoprolol succinate (TOPROL-XL) 25 MG 24 hr tablet Take 25 mg by mouth daily.    ? ?No facility-administered medications prior to visit.  ? ? ?Allergies  ?Allergen Reactions  ? Atorvastatin   ?  Myalgias / leg pain and weakness  ? Penicillins Hives and Itching  ?  Has patient had a PCN reaction causing immediate rash, facial/tongue/throat swelling, SOB or lightheadedness with hypotension:Yes ?Has patient had a PCN reaction causing severe rash involving mucus membranes or skin necrosis:No ?Has patient had a PCN reaction that required hospitalization:No ?Has patient had a PCN reaction occurring within the last 10 years:No ?If all of the above answers are "NO", then may proceed with Cephalosporin use. ?  ? Verapamil   ?  Junctional rhythm  ? Influenza Vaccines Rash  ?  tinnitus  ? ? ?Review of Systems  ?Constitutional:  Negative for fever.  ?HENT:  Negative for congestion.   ?Respiratory:  Negative for shortness of breath.   ?Cardiovascular:  Positive for leg swelling. Negative for chest pain and palpitations.  ?Gastrointestinal: Negative.   ?Genitourinary:  Positive for frequency.  ? ?   ?Objective:  ?  ?Physical Exam ?Constitutional:   ?   General: He is not in acute distress. ?   Appearance: He is not ill-appearing.  ?HENT:  ?   Head: Normocephalic and atraumatic.  ?   Right Ear: External ear normal.  ?   Left Ear: External ear normal.  ?   Mouth/Throat:  ?   Tonsils: No tonsillar exudate.  ?Eyes:  ?   Extraocular Movements: Extraocular movements intact.  ?   Pupils: Pupils are equal, round, and reactive to light.  ?Cardiovascular:  ?   Rate and Rhythm: Normal rate and regular rhythm.  ?   Heart sounds: Normal heart  sounds. No murmur heard. ?  No gallop.  ?Pulmonary:  ?   Effort: Pulmonary effort is normal. No respiratory distress.  ?   Breath sounds: Normal breath sounds. No wheezing or rales.  ?Musculoskeletal:  ?   Right

## 2021-06-19 NOTE — Assessment & Plan Note (Signed)
Improved with decreased sodium intake and compression stockings used daily. ?

## 2021-06-19 NOTE — Assessment & Plan Note (Signed)
Supplement and monitor 

## 2021-06-19 NOTE — Assessment & Plan Note (Signed)
Encourage heart healthy diet such as MIND or DASH diet, increase exercise, avoid trans fats, simple carbohydrates and processed foods, consider a krill or fish or flaxseed oil cap daily.  °

## 2021-06-19 NOTE — Patient Instructions (Signed)
Edema °Edema is an abnormal buildup of fluids in the body tissues and under the skin. Swelling of the legs, feet, and ankles is a common symptom that becomes more likely as you get older. Swelling is also common in looser tissues, like around the eyes. When the affected area is squeezed, the fluid may move out of that spot and leave a dent for a few moments. This dent is called pitting edema. °There are many possible causes of edema. Eating too much salt (sodium) and being on your feet or sitting for a long time can cause edema in your legs, feet, and ankles. Hot weather may make edema worse. Common causes of edema include: °Heart failure. °Liver or kidney disease. °Weak leg blood vessels. °Cancer. °An injury. °Pregnancy. °Medicines. °Being obese. °Low protein levels in the blood. °Edema is usually painless. Your skin may look swollen or shiny. °Follow these instructions at home: °Keep the affected body part raised (elevated) above the level of your heart when you are sitting or lying down. °Do not sit still or stand for long periods of time. °Do not wear tight clothing. Do not wear garters on your upper legs. °Exercise your legs to get your circulation going. This helps to move the fluid back into your blood vessels, and it may help the swelling go down. °Wear elastic bandages or support stockings to reduce swelling as told by your health care provider. °Eat a low-salt (low-sodium) diet to reduce fluid as told by your health care provider. °Depending on the cause of your swelling, you may need to limit how much fluid you drink (fluid restriction). °Take over-the-counter and prescription medicines only as told by your health care provider. °Contact a health care provider if: °Your edema does not get better with treatment. °You have heart, liver, or kidney disease and have symptoms of edema. °You have sudden and unexplained weight gain. °Get help right away if: °You develop shortness of breath or chest pain. °You  cannot breathe when you lie down. °You develop pain, redness, or warmth in the swollen areas. °You have heart, liver, or kidney disease and suddenly get edema. °You have a fever and your symptoms suddenly get worse. °Summary °Edema is an abnormal buildup of fluids in the body tissues and under the skin. °Eating too much salt (sodium) and being on your feet or sitting for a long time can cause edema in your legs, feet, and ankles. °Keep the affected body part raised (elevated) above the level of your heart when you are sitting or lying down. °This information is not intended to replace advice given to you by your health care provider. Make sure you discuss any questions you have with your health care provider. °Document Revised: 08/03/2020 Document Reviewed: 12/28/2019 °Elsevier Patient Education © 2022 [employer] Inc[employer] ° °

## 2021-06-19 NOTE — Assessment & Plan Note (Signed)
Tolerating Eliquis and rate controlled. Is scheduled for new pacemaker later this week. ?

## 2021-06-20 ENCOUNTER — Other Ambulatory Visit: Payer: Self-pay | Admitting: Family Medicine

## 2021-06-20 DIAGNOSIS — Z7984 Long term (current) use of oral hypoglycemic drugs: Secondary | ICD-10-CM | POA: Diagnosis not present

## 2021-06-20 DIAGNOSIS — H9190 Unspecified hearing loss, unspecified ear: Secondary | ICD-10-CM | POA: Diagnosis not present

## 2021-06-20 DIAGNOSIS — I130 Hypertensive heart and chronic kidney disease with heart failure and stage 1 through stage 4 chronic kidney disease, or unspecified chronic kidney disease: Secondary | ICD-10-CM | POA: Diagnosis not present

## 2021-06-20 DIAGNOSIS — E876 Hypokalemia: Secondary | ICD-10-CM | POA: Diagnosis not present

## 2021-06-20 DIAGNOSIS — E1122 Type 2 diabetes mellitus with diabetic chronic kidney disease: Secondary | ICD-10-CM | POA: Diagnosis not present

## 2021-06-20 DIAGNOSIS — Z9049 Acquired absence of other specified parts of digestive tract: Secondary | ICD-10-CM | POA: Diagnosis not present

## 2021-06-20 DIAGNOSIS — I4819 Other persistent atrial fibrillation: Secondary | ICD-10-CM | POA: Diagnosis not present

## 2021-06-20 DIAGNOSIS — Z9181 History of falling: Secondary | ICD-10-CM | POA: Diagnosis not present

## 2021-06-20 DIAGNOSIS — E440 Moderate protein-calorie malnutrition: Secondary | ICD-10-CM | POA: Diagnosis not present

## 2021-06-20 DIAGNOSIS — N1832 Chronic kidney disease, stage 3b: Secondary | ICD-10-CM | POA: Diagnosis not present

## 2021-06-20 DIAGNOSIS — Z7901 Long term (current) use of anticoagulants: Secondary | ICD-10-CM | POA: Diagnosis not present

## 2021-06-20 DIAGNOSIS — Z950 Presence of cardiac pacemaker: Secondary | ICD-10-CM | POA: Diagnosis not present

## 2021-06-20 DIAGNOSIS — I50810 Right heart failure, unspecified: Secondary | ICD-10-CM | POA: Diagnosis not present

## 2021-06-20 DIAGNOSIS — E559 Vitamin D deficiency, unspecified: Secondary | ICD-10-CM | POA: Diagnosis not present

## 2021-06-20 DIAGNOSIS — K219 Gastro-esophageal reflux disease without esophagitis: Secondary | ICD-10-CM | POA: Diagnosis not present

## 2021-06-20 DIAGNOSIS — I5043 Acute on chronic combined systolic (congestive) and diastolic (congestive) heart failure: Secondary | ICD-10-CM | POA: Diagnosis not present

## 2021-06-20 DIAGNOSIS — E785 Hyperlipidemia, unspecified: Secondary | ICD-10-CM | POA: Diagnosis not present

## 2021-06-20 NOTE — Telephone Encounter (Signed)
You saw patient yesterday but not sure if you discussed about getting back on this.  He was taken off when he was in the hospital 04/20/21. ?

## 2021-06-21 ENCOUNTER — Other Ambulatory Visit: Payer: Self-pay | Admitting: Family Medicine

## 2021-06-21 NOTE — Pre-Procedure Instructions (Signed)
Instructed patient on the following items: ?Arrival time 0700 ?Nothing to eat or drink after midnight ?No meds AM of procedure ?Responsible person to drive you home and stay with you for 24 hrs ?Wash with special soap night before and morning of procedure ?If on anti-coagulant drug instructions Eliquis- Last dose this AM ?  ?

## 2021-06-22 ENCOUNTER — Encounter (HOSPITAL_COMMUNITY)
Admission: RE | Disposition: A | Discharge status: Home / Self Care | Payer: PPO | Source: Home / Self Care | Attending: Internal Medicine

## 2021-06-22 ENCOUNTER — Ambulatory Visit (HOSPITAL_COMMUNITY): Payer: PPO

## 2021-06-22 ENCOUNTER — Other Ambulatory Visit: Payer: Self-pay

## 2021-06-22 ENCOUNTER — Ambulatory Visit (HOSPITAL_COMMUNITY)
Admission: RE | Admit: 2021-06-22 | Discharge: 2021-06-22 | Disposition: A | Discharge status: Home / Self Care | Payer: PPO | Attending: Internal Medicine | Admitting: Internal Medicine

## 2021-06-22 DIAGNOSIS — N184 Chronic kidney disease, stage 4 (severe): Secondary | ICD-10-CM | POA: Insufficient documentation

## 2021-06-22 DIAGNOSIS — R42 Dizziness and giddiness: Secondary | ICD-10-CM | POA: Diagnosis not present

## 2021-06-22 DIAGNOSIS — Z950 Presence of cardiac pacemaker: Secondary | ICD-10-CM | POA: Diagnosis not present

## 2021-06-22 DIAGNOSIS — E1122 Type 2 diabetes mellitus with diabetic chronic kidney disease: Secondary | ICD-10-CM | POA: Diagnosis not present

## 2021-06-22 DIAGNOSIS — N183 Chronic kidney disease, stage 3 unspecified: Secondary | ICD-10-CM | POA: Diagnosis not present

## 2021-06-22 DIAGNOSIS — I130 Hypertensive heart and chronic kidney disease with heart failure and stage 1 through stage 4 chronic kidney disease, or unspecified chronic kidney disease: Secondary | ICD-10-CM | POA: Insufficient documentation

## 2021-06-22 DIAGNOSIS — I5040 Unspecified combined systolic (congestive) and diastolic (congestive) heart failure: Secondary | ICD-10-CM | POA: Insufficient documentation

## 2021-06-22 DIAGNOSIS — R0609 Other forms of dyspnea: Secondary | ICD-10-CM | POA: Insufficient documentation

## 2021-06-22 DIAGNOSIS — R001 Bradycardia, unspecified: Secondary | ICD-10-CM | POA: Diagnosis not present

## 2021-06-22 DIAGNOSIS — I509 Heart failure, unspecified: Secondary | ICD-10-CM | POA: Diagnosis not present

## 2021-06-22 DIAGNOSIS — E1136 Type 2 diabetes mellitus with diabetic cataract: Secondary | ICD-10-CM | POA: Diagnosis not present

## 2021-06-22 DIAGNOSIS — I5043 Acute on chronic combined systolic (congestive) and diastolic (congestive) heart failure: Secondary | ICD-10-CM | POA: Diagnosis not present

## 2021-06-22 DIAGNOSIS — R9431 Abnormal electrocardiogram [ECG] [EKG]: Secondary | ICD-10-CM | POA: Insufficient documentation

## 2021-06-22 DIAGNOSIS — I428 Other cardiomyopathies: Secondary | ICD-10-CM | POA: Insufficient documentation

## 2021-06-22 DIAGNOSIS — R0989 Other specified symptoms and signs involving the circulatory and respiratory systems: Secondary | ICD-10-CM | POA: Insufficient documentation

## 2021-06-22 DIAGNOSIS — I420 Dilated cardiomyopathy: Secondary | ICD-10-CM

## 2021-06-22 DIAGNOSIS — I442 Atrioventricular block, complete: Secondary | ICD-10-CM | POA: Diagnosis not present

## 2021-06-22 DIAGNOSIS — I4821 Permanent atrial fibrillation: Secondary | ICD-10-CM | POA: Insufficient documentation

## 2021-06-22 DIAGNOSIS — Z7901 Long term (current) use of anticoagulants: Secondary | ICD-10-CM | POA: Insufficient documentation

## 2021-06-22 DIAGNOSIS — I440 Atrioventricular block, first degree: Secondary | ICD-10-CM | POA: Insufficient documentation

## 2021-06-22 DIAGNOSIS — J811 Chronic pulmonary edema: Secondary | ICD-10-CM | POA: Diagnosis not present

## 2021-06-22 DIAGNOSIS — I517 Cardiomegaly: Secondary | ICD-10-CM | POA: Diagnosis not present

## 2021-06-22 HISTORY — PX: BIV PACEMAKER INSERTION CRT-P: EP1199

## 2021-06-22 LAB — GLUCOSE, CAPILLARY
Glucose-Capillary: 76 mg/dL (ref 70–99)
Glucose-Capillary: 77 mg/dL (ref 70–99)

## 2021-06-22 SURGERY — BIV PACEMAKER INSERTION CRT-P

## 2021-06-22 MED ORDER — SODIUM CHLORIDE 0.9 % IV SOLN: INTRAVENOUS | Status: DC

## 2021-06-22 MED ORDER — HEPARIN (PORCINE) IN NACL 1000-0.9 UT/500ML-% IV SOLN
INTRAVENOUS | Status: DC | PRN
  Administered 2021-06-22: 500 mL

## 2021-06-22 MED ORDER — SODIUM CHLORIDE 0.9 % IV SOLN
INTRAVENOUS | Status: AC
  Filled 2021-06-22: qty 2

## 2021-06-22 MED ORDER — LIDOCAINE HCL (PF) 1 % IJ SOLN
INTRAMUSCULAR | Status: DC | PRN
  Administered 2021-06-22: 60 mL

## 2021-06-22 MED ORDER — VANCOMYCIN HCL IN DEXTROSE 1-5 GM/200ML-% IV SOLN
1000.0000 mg | INTRAVENOUS | Status: AC
  Administered 2021-06-22: 1000 mg via INTRAVENOUS
  Filled 2021-06-22: qty 200

## 2021-06-22 MED ORDER — ONDANSETRON HCL 4 MG/2ML IJ SOLN: 4.0000 mg | Freq: Four times a day (QID) | INTRAMUSCULAR | Status: DC | PRN

## 2021-06-22 MED ORDER — MIDAZOLAM HCL 5 MG/5ML IJ SOLN
INTRAMUSCULAR | Status: DC | PRN
  Administered 2021-06-22 (×2): .5 mg via INTRAVENOUS

## 2021-06-22 MED ORDER — VANCOMYCIN HCL IN DEXTROSE 1-5 GM/200ML-% IV SOLN
INTRAVENOUS | Status: AC
  Filled 2021-06-22: qty 200

## 2021-06-22 MED ORDER — SODIUM CHLORIDE 0.9 % IV SOLN
80.0000 mg | INTRAVENOUS | Status: AC
  Administered 2021-06-22: 80 mg
  Filled 2021-06-22: qty 2

## 2021-06-22 MED ORDER — MIDAZOLAM HCL 5 MG/5ML IJ SOLN
INTRAMUSCULAR | Status: AC
  Filled 2021-06-22: qty 5

## 2021-06-22 MED ORDER — FENTANYL CITRATE (PF) 100 MCG/2ML IJ SOLN
INTRAMUSCULAR | Status: AC
  Filled 2021-06-22: qty 2

## 2021-06-22 MED ORDER — LIDOCAINE HCL (PF) 1 % IJ SOLN
INTRAMUSCULAR | Status: AC
Start: 2021-06-22 — End: ?
  Filled 2021-06-22: qty 60

## 2021-06-22 MED ORDER — FENTANYL CITRATE (PF) 100 MCG/2ML IJ SOLN
INTRAMUSCULAR | Status: DC | PRN
Start: 2021-06-22 — End: 2021-06-22
  Administered 2021-06-22 (×2): 25 ug via INTRAVENOUS

## 2021-06-22 MED ORDER — ACETAMINOPHEN 325 MG PO TABS
325.0000 mg | ORAL_TABLET | ORAL | Status: DC | PRN
  Filled 2021-06-22: qty 2

## 2021-06-22 SURGICAL SUPPLY — 15 items
BALLN ATTAIN 80 (BALLOONS)
BALLOON ATTAIN 80 (BALLOONS) IMPLANT
CABLE SURGICAL S-101-97-12 (CABLE) ×2 IMPLANT
CATH CPS DIRECT 135 DS2C020 (CATHETERS) ×1 IMPLANT
DEVICE CRTP PERCEPTA QUAD MRI (Pacemaker) ×1 IMPLANT
GUIDEWIRE ANGLED .035X150CM (WIRE) ×1 IMPLANT
HEMOSTAT SURGICEL 2X4 FIBR (HEMOSTASIS) ×1 IMPLANT
KIT MICROPUNCTURE NIT STIFF (SHEATH) ×1 IMPLANT
LEAD QUARTET 1458Q-86CM (Lead) ×1 IMPLANT
PAD DEFIB RADIO PHYSIO CONN (PAD) ×2 IMPLANT
SHEATH 9.5FR PRELUDE SNAP 13 (SHEATH) ×1 IMPLANT
SLITTER UNIVERSAL DS2A003 (MISCELLANEOUS) ×1 IMPLANT
TRAY PACEMAKER INSERTION (PACKS) ×2 IMPLANT
WIRE ACUITY WHISPER EDS 4648 (WIRE) ×1 IMPLANT
WIRE HI TORQ VERSACORE-J 145CM (WIRE) ×1 IMPLANT

## 2021-06-22 NOTE — Interval H&P Note (Signed)
History and Physical Interval Note: ? ?06/22/2021 ?8:03 AM ? ?Vincent Allen  has presented today for surgery, with the diagnosis of cardiomyopathy.  The various methods of treatment have been discussed with the patient and family. After consideration of risks, benefits and other options for treatment, the patient has consented to  Procedure(s): ?UPGRADE TO BIV PACEMAKER INSERTION CRT-P (N/A) as a surgical intervention.  The patient's history has been reviewed, patient examined, no change in status, stable for surgery.  I have reviewed the patient's chart and labs.  Questions were answered to the patient's satisfaction.   ? ? ?[name] ? ?Renal insufficiency   ? ?Complete heart block  ? ?Atrial fib permanent ? ?LV dysfunction moderate ? ?Suspect LSCV stenosis or occlusion ? ?Pacer Medtronic  ? ? ? ?For CRT upgrade  have reviewed the risks of infection and perforation etc  and benefits and the possibility that we may need to go to the Right side and tunnel ?He understands agrees and is will to proceed  ?

## 2021-06-22 NOTE — Discharge Instructions (Addendum)
After Your Pacemaker ? ? ?You have a Medtronic Pacemaker ? ?ACTIVITY ?Do not lift your arm above shoulder height for 1 week after your procedure. After 7 days, you may progress as below.  ?You should remove your sling 24 hours after your procedure, unless otherwise instructed by your provider.  ? ? ? Friday June 29, 2021  Saturday June 30, 2021 Sunday July 01, 2021 Monday July 02, 2021  ? ?Do not lift, push, pull, or carry anything over 10 pounds with the affected arm until 6 weeks (Friday Aug 03, 2021 ) after your procedure.  ? ?You may drive AFTER your wound check, unless you have been told otherwise by your provider.  ? ?Ask your healthcare provider when you can go back to work ? ? ?INCISION/Dressing ?If you are on a blood thinner such as Coumadin, Xarelto, Eliquis, Plavix, or Pradaxa please confirm with your provider when this should be resumed.  ? ?If large square, outer bandage is left in place, this can be removed after 24 hours from your procedure. Do not remove steri-strips or glue as below.  ? ?Monitor your Pacemaker site for redness, swelling, and drainage. Call the device clinic at [phone] if you experience these symptoms or fever/chills. ? ?If your incision is closed with Dermabond/Surgical glue. You may shower 1 day after your pacemaker implant and wash around the site with soap and water.   ? ?If you were discharged in a sling, please do not wear this during the day more than 48 hours after your surgery unless otherwise instructed. This may increase the risk of stiffness and soreness in your shoulder.  ? ?Avoid lotions, ointments, or perfumes over your incision until it is well-healed. ? ?You may use a hot tub or a pool AFTER your wound check appointment if the incision is completely closed. ? ?Pacemaker Alerts:  Some alerts are vibratory and others beep. These are NOT emergencies. Please call our office to let us know. If this occurs at night or on weekends, it can wait until the next  business day. Send a remote transmission. ? ?If your device is capable of reading fluid status (for heart failure), you will be offered monthly monitoring to review this with you.  ? ?DEVICE MANAGEMENT ?Remote monitoring is used to monitor your pacemaker from home. This monitoring is scheduled every 91 days by our office. It allows [place] to keep an eye on the functioning of your device to ensure it is working properly. You will routinely see your Electrophysiologist annually (more often if necessary).  ? ?You should receive your ID card for your new device in 4-8 weeks. Keep this card with you at all times once received. Consider wearing a medical alert bracelet or necklace. ? ?Your Pacemaker may be MRI compatible. This will be discussed at your next office visit/wound check.  You should avoid contact with strong electric or magnetic fields.  ? ?Do not use amateur (ham) radio equipment or electric (arc) welding torches. MP3 player headphones with magnets should not be used. Some devices are safe to use if held at least 12 inches (30 cm) from your Pacemaker. These include power tools, lawn mowers, and speakers. If you are unsure if something is safe to use, ask your health care provider. ? ?When using your cell phone, hold it to the ear that is on the opposite side from the Pacemaker. Do not leave your cell phone in a pocket over the Pacemaker. ? ?You may safely use electric blankets, heating  pads, computers, and microwave ovens. ? ?Call the office right away if: ?You have chest pain. ?You feel more short of breath than you have felt before. ?You feel more light-headed than you have felt before. ?Your incision starts to open up. ? ?This information is not intended to replace advice given to you by your health care provider. Make sure you discuss any questions you have with your health care provider.  ?

## 2021-06-25 ENCOUNTER — Telehealth: Payer: Self-pay

## 2021-06-25 ENCOUNTER — Encounter (HOSPITAL_COMMUNITY): Payer: Self-pay | Admitting: Internal Medicine

## 2021-06-25 NOTE — Telephone Encounter (Signed)
Follow-up after same day discharge: ?Implant date: 06/22/2021 ?MD: [name], MD ?Device: Medtronic [phone] Marcelino Scot CRT-P ?Location: Left Chest ? ? ?Wound check visit: 07/04/2021 @ 11:20 ?90 day MD follow-up: 09/25/2021 @ 2:15 ? ?Remote Transmission received: TBD ?~ Patient had to pair remote monitor with MDT on the phone. MDT tech support states she will call patient back in 1 hour to see if paired with new device. ? ?Dressing removed: Yes  ?

## 2021-06-25 NOTE — Telephone Encounter (Signed)
-----   Message from Shirley Friar, PA-C sent at 06/22/2021 12:18 PM EDT ----- ?Same day BIV upgrade SK 4/7 ? ?

## 2021-06-26 ENCOUNTER — Encounter (HOSPITAL_COMMUNITY): Payer: Self-pay | Admitting: Internal Medicine

## 2021-06-27 ENCOUNTER — Other Ambulatory Visit: Payer: Self-pay | Admitting: Family Medicine

## 2021-06-27 NOTE — Telephone Encounter (Signed)
Remote transmission received. Normal device function. Presenting rhythm AF/BVP controlled rates. On [place]. ?

## 2021-06-29 NOTE — H&P (Signed)
Renal insufficiency   ?  ?Complete heart block  ? ?Pacemaker associated cardiomyopathy ? ?HFmrEF-acute/chronic  ? ?Atrial fib permanent ?  ?LV dysfunction moderate ?  ?Suspect LSCV stenosis or occlusion ?  ?Pacer Medtronic  ?  ?

## 2021-06-30 ENCOUNTER — Other Ambulatory Visit: Payer: Self-pay | Admitting: Family Medicine

## 2021-07-02 MED ORDER — EMPAGLIFLOZIN 10 MG PO TABS: 10.0000 mg | ORAL_TABLET | Freq: Every day | ORAL | 1 refills | Status: DC

## 2021-07-02 NOTE — Addendum Note (Signed)
Addended by: Lynnea Ferrier R on: 07/02/2021 09:55 AM ? ? Modules accepted: Orders ? ?

## 2021-07-04 ENCOUNTER — Ambulatory Visit (INDEPENDENT_AMBULATORY_CARE_PROVIDER_SITE_OTHER): Payer: PPO

## 2021-07-04 DIAGNOSIS — I5032 Chronic diastolic (congestive) heart failure: Secondary | ICD-10-CM | POA: Diagnosis not present

## 2021-07-04 LAB — CUP PACEART INCLINIC DEVICE CHECK
Battery Remaining Longevity: 87 mo
Battery Voltage: 3.13 V
Brady Statistic RA Percent Paced: 0 %
Brady Statistic RV Percent Paced: 98.54 %
Date Time Interrogation Session: 20230419165907
Implantable Lead Implant Date: 20180706
Implantable Lead Implant Date: 20180706
Implantable Lead Implant Date: 20230407
Implantable Lead Location: 753858
Implantable Lead Location: 753859
Implantable Lead Location: 753860
Implantable Lead Model: 5076
Implantable Lead Model: 5076
Implantable Pulse Generator Implant Date: 20230407
Lead Channel Impedance Value: 1064 Ohm
Lead Channel Impedance Value: 1083 Ohm
Lead Channel Impedance Value: 1140 Ohm
Lead Channel Impedance Value: 304 Ohm
Lead Channel Impedance Value: 380 Ohm
Lead Channel Impedance Value: 380 Ohm
Lead Channel Impedance Value: 456 Ohm
Lead Channel Impedance Value: 494 Ohm
Lead Channel Impedance Value: 551 Ohm
Lead Channel Impedance Value: 589 Ohm
Lead Channel Impedance Value: 646 Ohm
Lead Channel Impedance Value: 931 Ohm
Lead Channel Impedance Value: 988 Ohm
Lead Channel Impedance Value: 988 Ohm
Lead Channel Pacing Threshold Amplitude: 0.75 V
Lead Channel Pacing Threshold Amplitude: 1.875 V
Lead Channel Pacing Threshold Pulse Width: 0.4 ms
Lead Channel Pacing Threshold Pulse Width: 0.8 ms
Lead Channel Sensing Intrinsic Amplitude: 0.875 mV
Lead Channel Sensing Intrinsic Amplitude: 1 mV
Lead Channel Sensing Intrinsic Amplitude: 4.375 mV
Lead Channel Sensing Intrinsic Amplitude: 6 mV
Lead Channel Setting Pacing Amplitude: 2 V
Lead Channel Setting Pacing Amplitude: 3.5 V
Lead Channel Setting Pacing Pulse Width: 0.4 ms
Lead Channel Setting Pacing Pulse Width: 0.8 ms
Lead Channel Setting Sensing Sensitivity: 1.2 mV

## 2021-07-04 NOTE — Progress Notes (Signed)
Wound check appointment. Dermabond removed. Wound without redness or edema. Incision edges approximated, wound well healed. Normal device function. Thresholds, sensing, and impedances consistent with implant measurements. Device programmed at 3.5V/auto capture programmed on for extra safety margin until 3 month visit. Histogram distribution appropriate for patient and level of activity. AT/AF burden 100%. No high ventricular rates noted. Patient educated about wound care, arm mobility, lifting restrictions. ROV in 3 months with implanting physician. ?

## 2021-07-04 NOTE — Patient Instructions (Signed)
? ?  After Your Pacemaker   Monitor your pacemaker site for redness, swelling, and drainage. Call the device clinic at [phone] if you experience these symptoms or fever/chills.  Your incision was closed with Dermabond:  You may shower 1 day after your defibrillator implant and wash your incision with soap and water. Avoid lotions, ointments, or perfumes over your incision until it is well-healed.  You may use a hot tub or a pool after your wound check appointment if the incision is completely closed.  Do not lift, push or pull greater than 10 pounds with the affected arm until 6 weeks after your procedure. There are no other restrictions in arm movement after your wound check appointment.  You may drive, unless driving has been restricted by your healthcare providers.  Remote monitoring is used to monitor your pacemaker from home. This monitoring is scheduled every 91 days by our office. It allows us to keep an eye on the functioning of your device to ensure it is working properly. You will routinely see your Electrophysiologist annually (more often if necessary).  

## 2021-07-11 ENCOUNTER — Telehealth: Payer: Self-pay | Admitting: Family Medicine

## 2021-07-11 NOTE — Telephone Encounter (Signed)
Pt's son called on pt's behalf to update Dr. Si Gaul with the following symptoms: ? ?Adverse Weight Loss - has gone from 198 on 4.4.23 to 187 present ?No change in eating habits - still maintaining a low sodium diet ?Still taking '80mg'$  of Lasix ?Blood Sugar is 100-105 every morning fasting ? ?Please Advise. ?

## 2021-07-12 NOTE — Telephone Encounter (Signed)
Spoke with pt's son. He states pt is actually doing pretty well. He is eating like normal and his appetite is good. Advised pt's son they could add in some ensure or boost. Advised pt's son to let us now if he has any unusual symptoms.  ?

## 2021-07-13 ENCOUNTER — Encounter: Payer: Self-pay | Admitting: Podiatry

## 2021-07-13 ENCOUNTER — Ambulatory Visit: Payer: PPO | Admitting: Podiatry

## 2021-07-13 DIAGNOSIS — M79675 Pain in left toe(s): Secondary | ICD-10-CM

## 2021-07-13 DIAGNOSIS — E0822 Diabetes mellitus due to underlying condition with diabetic chronic kidney disease: Secondary | ICD-10-CM

## 2021-07-13 DIAGNOSIS — B351 Tinea unguium: Secondary | ICD-10-CM

## 2021-07-13 DIAGNOSIS — N183 Chronic kidney disease, stage 3 unspecified: Secondary | ICD-10-CM

## 2021-07-13 DIAGNOSIS — M79674 Pain in right toe(s): Secondary | ICD-10-CM

## 2021-07-22 NOTE — Progress Notes (Signed)
?Subjective:  ?Patient ID: Vincent Allen, male    DOB: 1934-01-26,  MRN: [id number] ? ?Vincent Allen presents to clinic today for at risk foot care. Pt has h/o NIDDM with chronic kidney disease and callus(es) bilateral heels and painful thick toenails that are difficult to trim. Painful toenails interfere with ambulation. Aggravating factors include wearing enclosed shoe gear. Pain is relieved with periodic professional debridement. Painful calluses are aggravated when weightbearing with and without shoegear. Pain is relieved with periodic professional debridement. ? ?Patient states blood glucose was 114 mg/dl today.   ? ?Last known HgA1c was 6.4%. ? ?Patient states he has been hospitalized since his last visit and has lost about 40 pounds. ? ?PCP is [name], MD , and last visit was June 19, 2021. ? ?Allergies  ?Allergen Reactions  ? Atorvastatin   ?  Myalgias / leg pain and weakness  ? Penicillins Hives and Itching  ?  Has patient had a PCN reaction causing immediate rash, facial/tongue/throat swelling, SOB or lightheadedness with hypotension:Yes ?Has patient had a PCN reaction causing severe rash involving mucus membranes or skin necrosis:No ?Has patient had a PCN reaction that required hospitalization:No ?Has patient had a PCN reaction occurring within the last 10 years:No ?If all of the above answers are "NO", then may proceed with Cephalosporin use. ?  ? Verapamil   ?  Junctional rhythm  ? Influenza Vaccines Rash  ?  tinnitus  ? ? ?Review of Systems: Negative except as noted in the HPI. ? ?Objective: There were no vitals filed for this visit. ? ?Vincent Allen is a pleasant 88 y.o. male in NAD. AAO X 3. ? ?Vascular Examination: ?Capillary refill time to digits immediate b/l. Palpable DP pulse(s) b/l LE. Faintly palpable PT pulse(s) b/l lower extremities. Pedal hair sparse. No pain with calf compression RLE. Lower extremity skin temperature gradient within normal limits. Trace edema noted BLE.  Evidence of chronic venous insufficiency b/l LE. No cyanosis or clubbing noted b/l LE. ? ?Dermatological Examination: ?No open wounds b/l LE. No interdigital macerations noted b/l LE. Toenails 1-5 b/l elongated, discolored, dystrophic, thickened, crumbly with subungual debris and tenderness to dorsal palpation. No hyperkeratotic lesion(s) plantar heel pad of both feet.  No erythema, no edema, no drainage, no fluctuance. Hyperpigmentation consistent with findings of chronic venous insufficiency is present b/l lower extremities. ? ?Musculoskeletal Examination: ?Muscle strength 5/5 to all lower extremity muscle groups bilaterally. No pain, crepitus or joint limitation noted with ROM bilateral LE. Pes planus deformity noted bilateral LE. Utilizes cane for ambulation assistance. ? ?Neurological Examination: ?Protective sensation intact 5/5 intact bilaterally with 10g monofilament b/l. ? ? ?  Latest Ref Rng & Units 04/02/2021  ?  2:56 PM 11/30/2020  ?  4:00 PM 08/10/2020  ?  3:13 PM  ?Hemoglobin A1C  ?Hemoglobin-A1c 4.6 - 6.5 % 5.8   6.3   6.8    ? ?Assessment/Plan: ?1. Pain due to onychomycosis of toenails of both feet   ?2. Diabetes mellitus due to underlying condition with stage 3 chronic kidney disease, unspecified whether long term insulin use, unspecified whether stage 3a or 3b CKD ([place])   ?  ? ?-Patient was evaluated and treated. All patient's and/or POA's questions/concerns answered on today's visit. ?-Patient to continue soft, supportive shoe gear daily. ?-Toenails 1-5 b/l were debrided in length and girth with sterile nail nippers and dremel without iatrogenic bleeding.  ?-Patient/POA to call should there be question/concern in the interim.  ? ?Return in about  3 months (around 10/12/2021). ? ?Marzetta Board, DPM  ?

## 2021-07-23 ENCOUNTER — Ambulatory Visit: Payer: PPO | Admitting: Internal Medicine

## 2021-07-24 ENCOUNTER — Ambulatory Visit (INDEPENDENT_AMBULATORY_CARE_PROVIDER_SITE_OTHER): Payer: PPO | Admitting: Pharmacist

## 2021-07-24 DIAGNOSIS — I10 Essential (primary) hypertension: Secondary | ICD-10-CM

## 2021-07-24 DIAGNOSIS — E785 Hyperlipidemia, unspecified: Secondary | ICD-10-CM

## 2021-07-24 DIAGNOSIS — I5032 Chronic diastolic (congestive) heart failure: Secondary | ICD-10-CM

## 2021-07-24 DIAGNOSIS — I4821 Permanent atrial fibrillation: Secondary | ICD-10-CM

## 2021-07-24 DIAGNOSIS — N183 Chronic kidney disease, stage 3 unspecified: Secondary | ICD-10-CM

## 2021-07-24 DIAGNOSIS — E0822 Diabetes mellitus due to underlying condition with diabetic chronic kidney disease: Secondary | ICD-10-CM

## 2021-07-24 NOTE — Chronic Care Management (AMB) (Signed)
? ? ?Chronic Care Management ?Pharmacy Note ? ?07/24/2021 ?Name:  Vincent Allen MRN:  [id number] DOB:  08/17/1933 ? ?Summary:  ?CHF: Checking weight daily. Had lost about 12lbs in April 2023 but son reports weight has been stable over the last 2 weeks. Appetite is normal and no change in eating habits. He is taking furosemide 66m - 2 tablets = 850mdaily. Son asks if patient might be able to reduce dose of furosemide due to increased frequency of nocturia. Patient is taking furosemide in the morning and also takes tamsulosin each evening. ?Son also mentions that patient has been complaining of weakness and twitching in legs. Potassium supplement was discontinued 05/10/2021. Last potassium checked 05/24/2021 was 3.7.   ?Type 2 DM: Home blood glucose has been 90 to 120. None < 80 at home but blood glucose during hospitalization in April for pacemaker change was 77 and 76. Patient is taking glipizide 103m 0.5 tablet = 5mg8mily, Januvia 50mg27m.5 tablet = 25mg 64my and Jardiance 10mg d17m. Last A1c was 5.8% on 04/02/2021.  ?Medication Management: Not in Medicare coverage gap yet but is taking 3 costly medications - Januvia, Jardiance and Eliquis. Patient has completed medication assistance program applications for each but did not include financial information.  ? ?Plan:  ?Will check with Dr [name] aCharlett Blakechecking BMP, BNP and A1c. Add potassium after labs if needed. BNP is WNL could lower dose of furosemide to 60mg da35m(1.5 tablet) and continue to check weight. Given patient's age, if a1C still < 7.0% consider stopping glipizide to prevent hypoglycemia. Son to check for information and provided to Clinical Pharmacist Practitioner. ? ?Follow up: PCP appointment 10/02/2021; Cardio appointment 09/25/2021;Clinical Pharmacist Practitioner will check back in 1 month regarding weight and patient assistance.  ? ? ?Subjective: ?Vincent CNGAI PARCELL7 y.o. 50ar old male who is a primary patient of [name], SMosie LukesThe CCM team was consulted for assistance with disease management and care coordination needs.   ? ?Engaged with patient and son by telephone for follow up visit in response to provider referral for pharmacy case management and/or care coordination services.  ? ?Consent to Services:  ?The patient was given information about Chronic Care Management services, agreed to services, and gave verbal consent prior to initiation of services.  Please see initial visit note for detailed documentation.  ? ?Patient Care Team: ?[name], SMosie LukesPCP - General (Family Medicine) ?Hilty, KPixie CasinoPCP - Cardiology (Cardiology) ?Klein, SDeboraha SprangPCP - Electrophysiology (Cardiology) ?Groat, RClent JacksConsulting Physician (Ophthalmology) ?Jones, DDanella SensingConsulting Physician (Dermatology) ?Nelson, Vincent SparkConsulting Physician (Cardiology) ?WhitfielGarald BaldingConsulting Physician (Orthopedic Surgery) ?Regal, NWallene Huh Consulting Physician (Podiatry) ?Getsemani Lindon, Vincent RobinsP (Pharmacist) ? ?Recent Office Visits:  ?07/11/2021 - Fam Med (Dr [name]) PCharlett BlakeCall. Son reports weight loss from 198 to 187 over the last 3 weeks. Eating habits the same. Nutrition consult offered. Suggested add 2 meal supplements of Boost or Endure daily. Appt in 4 weeks with PCP. ?06/19/2021 - Fam Med (Dr [name]) PCharlett Blaketative health visit and F/U chronic conditions. Metoprolol 25mg and53massium was removed from patient's med list during appointment but not reason stated.  ?05/14/2021 - Fam Med (Beck, NPOlevia Bowensn for hospital follow up. No med changes noted. Checked CBC and CMP - Scr was up a little at 2.19 and eGFR was 26.75. ?03/23/2021 - Fam Med (Dr [name])Vincent Allen  F/U diabetes; patient experience 2 hypoglycemia events recently. Lowered dose of glipizide to 72m twice a day. Labs checked - showed worsening renal function. Stopped triamterene/HCTZ. rechecked renal funciton in 1 week. Referral to nephrology. ?01/25/2021 - Fam  Med (Dr BCharlett Allen Video Visit for right leg pain and swelling / HTN. Recommended compression hose; increase furosemide to 2 tablets for 3 days, then go back to 1 tablet daily. Ordered ultrasound to r/o DVT.  Ultrasound showed no DVT in right leg.  ? ?Recent Consults:  ?07/13/2021 - Podiatry - Diabetic foot care.  ?07/04/2021 - Cardio (Rollene Rotunda RN) CHF clinic - wound check post pacemaker placement.  ?[facility]2021 [facility] No med changes noted.  ?05/24/2021 - Cardio (Dr KCaryl Comes F/U CHF and afib with pacemaker. Discussed CRTP implantation. Patient to hold ELiquis prior to procedure. Discontinued metoprolol (for now) due to low BP. ?05/11/2021 - Cardio (Transition of Care Team - [facility]mm[facility] for [facility]F hospital follow up. No medication changes noted.  ?05/10/2021 - Cardio (Dr HDebara Pickett F/U afib and CHF. Started tamsulosin 0.448mnightly for BPH. Labs checked.  ?04/20/2021 - Nephrology (CaMacedoniaidney) no notes available. Patient sent to hospital due to suspected CHF exacerbation. ?02/26/2021 - Podiatry (Dr GaElisha PonderAnnual Diabetic foot exam ?02/01/2021 - [facility] (Dr WhDurward FortesSeen for right knee pain. Noted to have osteoarthritis of right knee and popliteal cyst per ultrasound. No intervention at this time as patient denied any symptoms. ? ?[facility]: ?[facility] Dr KlCaryl Comes ? ? [facility]20/20[facility]  ?Medications stated during hospitalization: Jardiance 1026maily, potsssium 20 mEq -  2 tablets daily and spironolactone 12.5mg42mily. ?Medications stopped during hospitalization: silodosin 4mg 26mpaflo), amlodipine 5mg, 3mcium carbonate, finasterine 5mg, t55materene-HCTZ 75-50mg, v64min D ?Medications changed during hospitalization: metoprolol decreased from 25mg twi48m day to 25mg once56may. Furosemide increased form 20 to 40mg daiy 25m0mg daily.20m ?Objective: ? ?Lab Results  ?Component Value Date  ? CREATININE 2.06 (H) 05/24/2021  ? CREATININE 2.19 (H) 05/14/2021  ? CREATININE 2.23 (H) 05/09/2021  ? ? ?Lab Results  ?Component Value Date  ? HGBA1C 5.8 04/02/2021  ? ?Last diabetic Eye exam:  ?Lab Results  ?Component Value Date/Time  ? HMDIABEYEEXA No Retinopathy 05/26/2019 12:00 AM  ?  ?Last diabetic Foot exam: No results found for: HMDIABFOOTEX  ? ?   ?Component Value Date/Time  ? CHOL 125 04/02/2021 1456  ? TRIG 84.0 04/02/2021 1456  ? HDL 31.30 (L) 04/02/2021 1456  ? CHOLHDL 4 04/02/2021 1456  ? VLDL 16.8 04/02/2021 1456  ? LDLCALC 77 0[place]23 1456  ? LDLCALC 101 (H) 12/16/2019 1543  ? LDLDIRECT 65.0 11/03/2018 1038  ? ? ? ?  Latest Ref Rng & Units 04/20/2021  ?  6:10 PM 04/09/2021  ?  3:40 PM 04/02/2021  ?  2:56 PM  ?Hepatic Function  ?Total Protein 6.5 - 8.1 g/dL 6.5   6.5   6.9    ?Albumin 3.5 - 5.0 g/dL 2.6   3.3   3.6    ?AST 15 - 41 U/L '23   22   28    ' ?ALT 0 - 44 U/L '17   18   16    ' ?Alk Phosphatase 38 - 126 U/L 65   84   77    ?Total Bilirubin 0.3 - 1.2 mg/dL 1.4   1.6  1.8    ?Bilirubin, Direct 0.0 - 0.2 mg/dL 0.5      ? ? ?Lab Results  ?Component Value Date/Time  ? TSH 4.58 04/02/2021 02:56 PM  ? TSH 4.55 11/30/2020 04:00 PM  ? FREET4 1.01 08/11/2020 04:25 PM  ? ? ? ?  Latest Ref Rng & Units 05/24/2021  ?  2:56 PM 05/14/2021  ? 12:11 PM 05/03/2021  ?  5:20 AM  ?CBC  ?WBC 3.4 - 10.8 x10E3/uL 6.3   5.6   6.7    ?Hemoglobin 13.0 - 17.7 g/dL 11.8   11.4   11.4    ?Hematocrit 37.5 - 51.0 % 36.4   35.7   35.3    ?Platelets 150 - 450 x10E3/uL 142   183.0   221    ? ? ?Lab Results  ?Component Value Date/Time  ? VD25OH 76.09 08/10/2020 03:13 PM  ? VD25OH 74.75 04/20/2020 11:06 AM  ? ? ?Clinical ASCVD: Yes  ?The ASCVD Risk score (Arnett DK, et al., 2019) failed to calculate for the following reasons: ?  The 2019 ASCVD risk score is only valid for ages 31 to 11   ? ? ?Social History  ? ?Tobacco Use  ?Smoking Status Never  ?Smokeless Tobacco Never  ? ?BP  Readings from Last 3 Encounters:  ?06/22/21 127/68  ?06/19/21 136/64  ?05/28/21 124/62  ? ?Pulse Readings from Last 3 Encounters:  ?06/22/21 60  ?06/19/21 65  ?05/28/21 77  ? ?Wt Readings from Last 3 Encounters:  ?06/22/21 197

## 2021-07-25 MED ORDER — SITAGLIPTIN PHOSPHATE 25 MG PO TABS: 25.0000 mg | ORAL_TABLET | Freq: Every day | ORAL | 2 refills | Status: DC

## 2021-07-25 NOTE — Patient Instructions (Signed)
Vincent Allen ?It was a pleasure speaking with you and your son again ?Below is a summary of your health goals and care plan ? ?If you have any questions or concerns, please feel free to contact me either at the phone number below or with a MyChart message.  ? ?Keep up the good work! ? ?Cherre Robins, PharmD ?Clinical Pharmacist ?[facility] ?[place] ?[phone] (direct line)  ?[phone] (main office number) ? ?Chronic Care Management Care Plan ? ?  ?Hypertension / High Blood pressure ?BP Readings from Last 3 Encounters:  ?06/22/21 127/68  ?06/19/21 136/64  ?05/28/21 124/62  ? ?Pharmacist Clinical Goal(s): ?Over the next 90 days, patient will work with PharmD and providers to maintain BP goal <140/90 ?Current regimen:  ?Metoprolol succinate '25mg'$  daily ?Interventions: ?Continue current hypertension medication regimen.  ?Reviewed blood pressure goal ?Recommend check blood pressure 2 to 3 times per week and record ? ?Congestive Heart Disease:  ?Goal: prevention of worsening of heart disease and minimize symptoms of congestive heart diseaes ?Current Regimen:  ?Furosemide '40mg'$  - take 2 tablets = '80mg'$  daily each morning  ?Metoprolol succinate ER '25mg'$  once a day  ?Jardiance '10mg'$  daily    ?Interventions:  ?Discussed signs and symptoms of CHF exacerbation - weight gain, SOB, abdominal fullness, swelling in legs or abdomen, Fatigue and weakness, changes in ability to perform usual activities, persistent cough or wheezing with white or pink blood-tinged mucus, nausea and lack of appetite ?Continue to weigh daily - report weight gain of more than 3 lbs in 24 hours or 5 lbs in 1 week. ? ?Hyperlipidemia ?Lab Results  ?Component Value Date/Time  ? LDLCALC 77 04/02/2021 02:56 PM  ? LDLCALC 101 (H) 12/16/2019 03:43 PM  ? LDLDIRECT 65.0 11/03/2018 10:38 AM  ? ?Pharmacist Clinical Goal(s): ?Over the next 90 days, patient will work with PharmD and providers to achieve LDL goal < 70 ?Current regimen:  ?Diet  and exercise management   ?Krill Oil '1000mg'$  daily ?Interventions: ?Discussed past experiences with atorvastatin - caused leg pain and weakness ?Recommend limiting intake of food high in saturated fat / avoid fried foods; increase intake of vegetables ?Patient self care activities - Over the next 90 days, patient will: ?Achieve LDL < 70 ?Continue Krill Oil ? ?Diabetes ?Lab Results  ?Component Value Date/Time  ? HGBA1C 5.8 04/02/2021 02:56 PM  ? HGBA1C 6.3 11/30/2020 04:00 PM  ? ?Pharmacist Clinical Goal(s): ?Over the next 90 days, patient will work with PharmD and providers to maintain A1c goal <7.5% ?Current regimen:  ?Januvia '25mg'$  daily ?Glipizide '10mg'$  - take 0.5 talbet = '5mg'$  daily ?Jardiance '10mg'$  daily ?Interventions: ?Reviewed medication dose and renal funciton  ?Recommended checking blood glucose / sugar daily ?Reviewed home blood glucose readings and reviewed goals  ?Fasting blood glucose goal (before meals) = 80 to 130 ?Blood glucose goal after a meal = less than 180  ?Patient self care activities - Over the next 90 days, patient will:   ?Continue to check blood glucose / sugar 1 to 2 times per day. Notify Dr Frederik Pear office if experience blood glucose < 80 ?How to Treat a Low Glucose Level:  ?If you have a low blood glucose less than 70, please eat / drink 15 grams of carbohydrates (4 oz of juice, soda, 4 glucose tablets, or 3-4 pieces of hard candy).  It is best so choose a "quick" source of sugar that does not contain fat (chocolate and peanut butter might take longer to increase your blood glucose) ?  Wait 15 minutes and then recheck your blood glucose. If your blood glucose is still less than 70, eat another 15 grams of carbohydrates.  ?Wait another 15 minutes and recheck your glucose.  ?Continue this until your blood glucose is over 70. Once you blood glucose is over 70, eat a snack with protein in it to prevent your blood glucose from dropping again. ?Reviewed home blood glucose readings and reviewed goals   ?Fasting blood glucose goal (before meals) = 80 to 130 ?Blood glucose goal after a meal = less than 180  ? ?Atrial fibrillation  ?Pharmacist Clinical Goal(s) ?Over the next 90 days, patient will work with PharmD and providers to reduce complications associated with atrial fibrillation ?Current regimen:  ?Eliquis 2.'5mg'$  twice daily in morning and with dinner ?Metoprolol succinate '25mg'$  daily ?Interventions: ?Maintain current atrial fibrillation medication regimen and plan to follow up with Dr Caryl Comes regarding pacemaker ?Will continue to monitor in 2023 for patient assistance program needs regarding Eliquis ? ?Decreased Kidney Function:  ?Pharmacist Clinical Goal(s) ?Over the next 90 days, patient will work with PharmD and providers to slow / prevent further decrease in kidney function ?Current regimen:  ?Jardiance '10mg'$  daily ?Interventions: ?Reviewed medications for adjustment based on kidney funciton ?Patient self care activities - Over the next 90 days, patient will: ?Avoid over the counter pain relievers like Aleve / naproxen; Motrin / ibuprofen. ?If you need anything for pain relief, headaches or fever - Tylenol / acetaminophen '500mg'$  up to 2 tablets every 8 hours is safer option.  ?Follow up with [facility]  ? ?Medication management ?Pharmacist Clinical Goal(s): ?Over the next 90 days, patient will work with PharmD and providers to achieve optimal medication adherence ?Current pharmacy: [facility] ?Interventions ?Comprehensive medication review performed. ?Continue current medication management strategy ?Patient self care activities - Over the next 90 days, patient will: ?Focus on medication adherence by filling and taking medications appropriately  ?Take medications as prescribed ?Report any questions or concerns to PharmD and/or provider(s) ?Contact Ameena Vesey at [facility] with yearly income for medication assistance program applications for Eliquis, Jardiance and Januvia ?Patient verbalizes  understanding of instructions and care plan provided today and agrees to view in [place]. Active MyChart status confirmed with patient.    ?

## 2021-07-26 ENCOUNTER — Other Ambulatory Visit: Payer: Self-pay

## 2021-07-26 DIAGNOSIS — E785 Hyperlipidemia, unspecified: Secondary | ICD-10-CM

## 2021-07-26 DIAGNOSIS — E0822 Diabetes mellitus due to underlying condition with diabetic chronic kidney disease: Secondary | ICD-10-CM

## 2021-07-26 DIAGNOSIS — I4821 Permanent atrial fibrillation: Secondary | ICD-10-CM

## 2021-07-26 DIAGNOSIS — N183 Chronic kidney disease, stage 3 unspecified: Secondary | ICD-10-CM

## 2021-07-26 DIAGNOSIS — E875 Hyperkalemia: Secondary | ICD-10-CM

## 2021-07-26 DIAGNOSIS — D649 Anemia, unspecified: Secondary | ICD-10-CM

## 2021-07-26 DIAGNOSIS — I5032 Chronic diastolic (congestive) heart failure: Secondary | ICD-10-CM

## 2021-07-26 DIAGNOSIS — I10 Essential (primary) hypertension: Secondary | ICD-10-CM

## 2021-07-26 NOTE — Progress Notes (Signed)
Labs for July appointment ordered. ?

## 2021-07-27 ENCOUNTER — Other Ambulatory Visit: Payer: Self-pay | Admitting: Family Medicine

## 2021-08-01 ENCOUNTER — Telehealth: Payer: Self-pay | Admitting: Family Medicine

## 2021-08-01 NOTE — Telephone Encounter (Signed)
Shanon Brow (son) called stating he had information regarding to pt to give to Tammy. When asked if that information could be realyed, he stated it was personal and would prefer to be called back at [phone]. ?

## 2021-08-01 NOTE — Telephone Encounter (Signed)
Tried to call back - no answer and unable to LM due to MB not set up. Suspect he was calling to provide household income for patient assistance program applications.  ?

## 2021-08-02 NOTE — Telephone Encounter (Signed)
Tried to call back - no answer and unable to LM due to MB not set up

## 2021-08-07 NOTE — Telephone Encounter (Signed)
Patient has already

## 2021-08-07 NOTE — Telephone Encounter (Signed)
Patient has returned applications for Jardiance, Eliquis and Januvia. Needed income information. Called and was able to speak with his son. Provided yearly income for applications. Will complete applications and forward to PCP for review and signature.  Son states that he has reached coverage gap. Will need to request report from pharmacy with total out of pocket spend for 2023. Son also requested Ozempic samples. Will check when in office tomorrow regarding samples.

## 2021-08-08 DIAGNOSIS — E119 Type 2 diabetes mellitus without complications: Secondary | ICD-10-CM | POA: Diagnosis not present

## 2021-08-08 DIAGNOSIS — H26491 Other secondary cataract, right eye: Secondary | ICD-10-CM | POA: Diagnosis not present

## 2021-08-08 DIAGNOSIS — H04123 Dry eye syndrome of bilateral lacrimal glands: Secondary | ICD-10-CM | POA: Diagnosis not present

## 2021-08-08 DIAGNOSIS — H02834 Dermatochalasis of left upper eyelid: Secondary | ICD-10-CM | POA: Diagnosis not present

## 2021-08-08 DIAGNOSIS — H353131 Nonexudative age-related macular degeneration, bilateral, early dry stage: Secondary | ICD-10-CM | POA: Diagnosis not present

## 2021-08-08 DIAGNOSIS — H02831 Dermatochalasis of right upper eyelid: Secondary | ICD-10-CM | POA: Diagnosis not present

## 2021-08-08 DIAGNOSIS — H47323 Drusen of optic disc, bilateral: Secondary | ICD-10-CM | POA: Diagnosis not present

## 2021-08-08 DIAGNOSIS — H35371 Puckering of macula, right eye: Secondary | ICD-10-CM | POA: Diagnosis not present

## 2021-08-08 DIAGNOSIS — Z961 Presence of intraocular lens: Secondary | ICD-10-CM | POA: Diagnosis not present

## 2021-08-08 LAB — HM DIABETES EYE EXAM

## 2021-08-08 NOTE — Telephone Encounter (Signed)
Left #42 samples of Eliquis 2.'5mg'$  for patient / son to pick up at front desk.

## 2021-08-10 ENCOUNTER — Telehealth: Payer: Self-pay | Admitting: Pharmacist

## 2021-08-10 NOTE — Telephone Encounter (Signed)
Mailed patient assistance program application for Januvia '25mg'$  to [employer] patient assistance program (program does not allow faxed applications)  Faxed patient assistance program application for Jardiance to [employer] and also for Eliquis to [employer].

## 2021-08-15 DIAGNOSIS — E1122 Type 2 diabetes mellitus with diabetic chronic kidney disease: Secondary | ICD-10-CM

## 2021-08-15 DIAGNOSIS — I4891 Unspecified atrial fibrillation: Secondary | ICD-10-CM | POA: Diagnosis not present

## 2021-08-15 DIAGNOSIS — Z7984 Long term (current) use of oral hypoglycemic drugs: Secondary | ICD-10-CM

## 2021-08-15 DIAGNOSIS — I130 Hypertensive heart and chronic kidney disease with heart failure and stage 1 through stage 4 chronic kidney disease, or unspecified chronic kidney disease: Secondary | ICD-10-CM

## 2021-08-15 DIAGNOSIS — I509 Heart failure, unspecified: Secondary | ICD-10-CM

## 2021-08-15 DIAGNOSIS — E785 Hyperlipidemia, unspecified: Secondary | ICD-10-CM

## 2021-08-15 DIAGNOSIS — M8580 Other specified disorders of bone density and structure, unspecified site: Secondary | ICD-10-CM

## 2021-08-15 DIAGNOSIS — N1832 Chronic kidney disease, stage 3b: Secondary | ICD-10-CM

## 2021-08-21 ENCOUNTER — Ambulatory Visit (INDEPENDENT_AMBULATORY_CARE_PROVIDER_SITE_OTHER): Payer: PPO

## 2021-08-21 ENCOUNTER — Ambulatory Visit: Payer: PPO | Admitting: Podiatry

## 2021-08-21 DIAGNOSIS — M109 Gout, unspecified: Secondary | ICD-10-CM | POA: Diagnosis not present

## 2021-08-21 DIAGNOSIS — S9032XA Contusion of left foot, initial encounter: Secondary | ICD-10-CM | POA: Diagnosis not present

## 2021-08-21 DIAGNOSIS — M778 Other enthesopathies, not elsewhere classified: Secondary | ICD-10-CM

## 2021-08-21 MED ORDER — DOXYCYCLINE HYCLATE 100 MG PO TABS: 100.0000 mg | ORAL_TABLET | Freq: Two times a day (BID) | ORAL | 0 refills | Status: DC

## 2021-08-21 MED ORDER — TRIAMCINOLONE ACETONIDE 40 MG/ML IJ SUSP
20.0000 mg | Freq: Once | INTRAMUSCULAR | Status: AC
  Administered 2021-08-21: 20 mg

## 2021-08-22 NOTE — Progress Notes (Signed)
Subjective:  Patient ID: Vincent Allen, male    DOB: 13-Jun-1933,  MRN: [id number] HPI Chief Complaint  Patient presents with   Foot Injury    Dorsal midfoot left - drink bottle fell on foot last Friday (08/17/21), cut foot slightly, cut has healed but foot has gotten more sore, red, swollen and hot over the week, tried Biofreeze    88 y.o. male presents with the above complaint.   ROS: Denies fever chills nausea vomit muscle aches pains calf pain back pain chest pain shortness of breath.  Past Medical History:  Diagnosis Date   Arthritis    Bradycardia    Cataract    Chronic kidney disease stage III (GFR 30-59 ml/min) 05/03/2012   Colon polyps    Complication of anesthesia    emesis   Diabetes mellitus    GERD (gastroesophageal reflux disease)    HOH (hard of hearing)    HTN (hypertension) 01/13/2013   Hyperlipidemia    Hypertension    Pacemaker 12/2016   Persistent atrial fibrillation (HCC)    Presence of permanent cardiac pacemaker 09/20/2016   Symptomatic bradycardia    Vitamin D deficiency 07/17/2015   Past Surgical History:  Procedure Laterality Date   ANKLE ARTHROPLASTY     BIV PACEMAKER INSERTION CRT-P N/A 06/22/2021   Procedure: UPGRADE TO BIV PACEMAKER INSERTION CRT-P;  Surgeon: Deboraha Sprang, MD;  Location: [facility];  Service: Cardiovascular;  Laterality: N/A;   CARDIOVERSION N/A 08/20/2016   Procedure: CARDIOVERSION;  Surgeon: Sanda Klein, MD;  Location: [facility];  Service: Cardiovascular;  Laterality: N/A;   CATARACT EXTRACTION  2010, 2014   CATARACT EXTRACTION  02/28/14   CHOLECYSTECTOMY     CHOLECYSTECTOMY N/A 05/03/2012   Procedure: LAPAROSCOPIC CHOLECYSTECTOMY WITH INTRAOPERATIVE CHOLANGIOGRAM;  Surgeon: [name], MD;  Location: [facility];  Service: General;  Laterality: N/A;   INSERT / REPLACE / REMOVE PACEMAKER  09/20/2016   PACEMAKER IMPLANT N/A 09/20/2016   Procedure: Pacemaker Implant;  Surgeon: Deboraha Sprang, MD;  Location: [facility];  Service: Cardiovascular;  Laterality: N/A;   ROTATOR CUFF REPAIR     SHOULDER SURGERY     TONSILLECTOMY      Current Outpatient Medications:    doxycycline (VIBRA-TABS) 100 MG tablet, Take 1 tablet (100 mg total) by mouth 2 (two) times daily., Disp: 20 tablet, Rfl: 0   furosemide (LASIX) 40 MG tablet, TAKE 2 TABLETS BY MOUTH EVERY DAY, Disp: 180 tablet, Rfl: 0   acetaminophen (TYLENOL) 500 MG tablet, Take 500 mg by mouth every 6 (six) hours as needed for moderate pain., Disp: , Rfl:    ELIQUIS 2.5 MG TABS tablet, TAKE 1 TABLET BY MOUTH TWICE A DAY, Disp: 60 tablet, Rfl: 5   empagliflozin (JARDIANCE) 10 MG TABS tablet, Take 1 tablet (10 mg total) by mouth daily., Disp: 90 tablet, Rfl: 1   glipiZIDE (GLUCOTROL) 10 MG tablet, TAKE 2 TABLETS BY MOUTH 2 TIMES DAILY BEFORE A MEAL. (Patient taking differently: Take 5 mg by mouth daily before breakfast.), Disp: 360 tablet, Rfl: 1   glucose blood (ONE TOUCH ULTRA TEST) test strip, USE 1 STRIP 2 TIMES DAILY TO CHECK BLOOD SUGAR DX E08.22, N18.3, Disp: 300 each, Rfl: 1   Krill Oil 1000 MG CAPS, Take 1,000 mg by mouth daily., Disp: , Rfl:    Multiple Vitamin (MULTIVITAMIN WITH MINERALS) TABS tablet, Take 1 tablet by mouth daily., Disp: , Rfl:    sitaGLIPtin (JANUVIA) 25 MG  tablet, Take 1 tablet (25 mg total) by mouth daily., Disp: 30 tablet, Rfl: 2   tamsulosin (FLOMAX) 0.4 MG CAPS capsule, Take 1 capsule (0.4 mg total) by mouth at bedtime., Disp: 90 capsule, Rfl: 3   Zinc 50 MG TABS, Take 50 mg by mouth daily., Disp: , Rfl:   Allergies  Allergen Reactions   Atorvastatin     Myalgias / leg pain and weakness   Penicillins Hives and Itching    Has patient had a PCN reaction causing immediate rash, facial/tongue/throat swelling, SOB or lightheadedness with hypotension:Yes Has patient had a PCN reaction causing severe rash involving mucus membranes or skin necrosis:No Has patient had a PCN reaction that required hospitalization:No Has  patient had a PCN reaction occurring within the last 10 years:No If all of the above answers are "NO", then may proceed with Cephalosporin use.    Verapamil     Junctional rhythm   Influenza Vaccines Rash    tinnitus   Review of Systems Objective:  There were no vitals filed for this visit.  General: Well developed, nourished, in no acute distress, alert and oriented x3   Dermatological: Skin is warm, dry and supple bilateral. Nails x 10 are well maintained; remaining integument appears unremarkable at this time. There are no open sores, no preulcerative lesions, no rash or signs of infection present.  Vascular: Dorsalis Pedis artery and Posterior Tibial artery pedal pulses are 2/4 bilateral with immedate capillary fill time. Pedal hair growth present. No varicosities and no lower extremity edema present bilateral.   Neruologic: Grossly intact via light touch bilateral. Vibratory intact via tuning fork bilateral. Protective threshold with Semmes Wienstein monofilament intact to all pedal sites bilateral. Patellar and Achilles deep tendon reflexes 2+ bilateral. No Babinski or clonus noted bilateral.   Musculoskeletal: No gross boney pedal deformities bilateral. No pain, crepitus, or limitation noted with foot and ankle range of motion bilateral. Muscular strength 5/5 in all groups tested bilateral.  Dorsal aspect of the left foot demonstrates no lacerations no open lesions.  He does have a large area of warm erythema overlying the dorsal aspect of the foot does not appear to be cellulitic traveling any particular direction.  He does have specific painful areas particularly overlying the first tarsometatarsal joint second metatarsal tarsal joint and dorsal aspect of that foot and radiographs do not demonstrate any type of osseous abnormalities in that area.    Gait: Unassisted, Nonantalgic.    Radiographs:  Radiographs demonstrate an osseously mature individual considerable osteopenia.  No  fractures are identified.  No acute findings are identified.  Osteoarthritic changes in the midfoot are identified.  Assessment & Plan:   Assessment: Most likely gouty capsulitis but cannot rule out cellulitic process  Plan: Injected the area today with 10 mg Kenalog 5 mg Marcaine to the point of maximal tenderness he tolerated that well.  Also started him on doxycycline will follow-up with him in a couple of weeks.     Adamari Frede T. [place], [place]

## 2021-08-23 ENCOUNTER — Ambulatory Visit (INDEPENDENT_AMBULATORY_CARE_PROVIDER_SITE_OTHER): Payer: PPO | Admitting: Pharmacist

## 2021-08-23 DIAGNOSIS — I10 Essential (primary) hypertension: Secondary | ICD-10-CM

## 2021-08-23 DIAGNOSIS — I4821 Permanent atrial fibrillation: Secondary | ICD-10-CM

## 2021-08-23 DIAGNOSIS — I779 Disorder of arteries and arterioles, unspecified: Secondary | ICD-10-CM

## 2021-08-23 DIAGNOSIS — E0822 Diabetes mellitus due to underlying condition with diabetic chronic kidney disease: Secondary | ICD-10-CM

## 2021-08-23 DIAGNOSIS — I5032 Chronic diastolic (congestive) heart failure: Secondary | ICD-10-CM

## 2021-08-23 DIAGNOSIS — E785 Hyperlipidemia, unspecified: Secondary | ICD-10-CM

## 2021-08-23 DIAGNOSIS — N183 Chronic kidney disease, stage 3 unspecified: Secondary | ICD-10-CM

## 2021-08-23 NOTE — Chronic Care Management (AMB) (Signed)
Chronic Care Management Pharmacy Note  08/23/2021 Name:  Vincent Allen MRN:  [id number] DOB:  [birth year]  Summary:  Type 2 DM: Home blood glucose has been 93 to 130. None < 80 at home but blood glucose during hospitalization in April for pacemaker change was 77 and 76. Patient is taking glipizide 27m - 0.5 tablet = 529mdaily, Januvia 501m 0.5 tablet = 48m55mily and Jardiance 10mg72mly. Last A1c was 5.8% on 04/02/2021.  Medication Management: checked to see if patient has been approved for patient assistance program for Jardiance - per [facility] program patient must apply for LIS. Assisted in applying for LIS on line today. Decision pending, letter will be mailed to patient.  Patient assistance program for Januvia - still pending (mailed 08/10/2021 to [employer] assistance program for Eliquis - needed updated out of pocket spend for 2023. Patient's son brought in most recent report. [place] to [employer].  Follow up: PCP appointment 10/02/2021; Cardio appointment 09/25/2021;Clinical Pharmacist Practitioner will check back in 1 month regarding blood glucose  and patient assistance.    Subjective: Vincent WEATHERALLn 88 y.88 year old male who is a primary patient of [name]  The CCM team was consulted for assistance with disease management and care coordination needs.    Engaged with patient and son by telephone for follow up visit in response to provider referral for pharmacy case management and/or care coordination services.   Consent to Services:  The patient was given information about Chronic Care Management services, agreed to services, and gave verbal consent prior to initiation of services.  Please see initial visit note for detailed documentation.   Patient Care Team: BlythMosie Lukesas PCP - General (Family Medicine) HiltyDebara PicketteNadean Corwinas PCP - Cardiology (Cardiology) KleinDeboraha Sprangas PCP - Electrophysiology  (Cardiology) GroatClent Jacksas Consulting Physician (Ophthalmology) JonesDanella Sensingas Consulting Physician (Dermatology) NelsoDorothy Sparkas Consulting Physician (Cardiology) WhitfGarald Baldingas Consulting Physician (Orthopedic Surgery) Regal, NormaTamala Fothergill as Consulting Physician (Podiatry) EckarCherre Robins-CPP (Pharmacist)  Recent Office Visits:  07/11/2021 - Fam Med (Dr BlythCharlett Blakene Call. Son reports weight loss from 198 to 187 over the last 3 weeks. Eating habits the same. Nutrition consult offered. Suggested add 2 meal supplements of Boost or Endure daily. Appt in 4 weeks with PCP. 06/19/2021 - Fam Med (Dr BlythCharlett Blakeventative health visit and F/U chronic conditions. Metoprolol 48mg 36mpotassium was removed from patient's med list during appointment but not reason stated.  05/14/2021 - Fam Med (Beck,Olevia BowensSeen for hospital follow up. No med changes noted. Checked CBC and CMP - Scr was up a little at 2.19 and eGFR was 26.75. 03/23/2021 - Fam Med (Dr [name])Charlett Blakediabetes; patient experience 2 hypoglycemia events recently. Lowered dose of glipizide to 5mg tw45m a day. Labs checked - showed worsening renal function. Stopped triamterene/HCTZ. rechecked renal funciton in 1 week. Referral to nephrology. 01/25/2021 - Fam Med (Dr [name]) [name] Visit for right leg pain and swelling / HTN. Recommended compression hose; increase furosemide to 2 tablets for 3 days, then go back to 1 tablet daily. Ordered ultrasound to r/o DVT.  Ultrasound showed no DVT in right leg.   Recent Consults:  07/13/2021 - Podiatry - Diabetic foot care.  07/04/2021 - Cardio ([facility] - wound check post pacemaker placement.  05/28/2021 - [place]. No med changes noted.  05/24/2021 - Cardio (Dr Caryl Comes) F/U CHF and afib with pacemaker. Discussed CRTP implantation. Patient to hold ELiquis prior to procedure. Discontinued metoprolol (for now) due to low BP. 05/11/2021 - Cardio  (Transition of Care Team - [facility] Seen for CHF hospital follow up. No medication changes noted.  05/10/2021 - Cardio (Dr Debara Pickett) F/U afib and CHF. Started tamsulosin 0.72m nightly for BPH. Labs checked.  04/20/2021 - Nephrology ([facility]) no notes available. Patient sent to hospital due to suspected CHF exacerbation. 02/26/2021 - Podiatry (Dr GElisha Ponder Annual Diabetic foot exam 02/01/2021 -[facility] (Dr WDurward Fortes Seen for right knee pain. Noted to have osteoarthritis of right knee and popliteal cyst per ultrasound. No intervention at this time as patient denied any symptoms.  [facility]: [facility] Dr KC[facility] Comes    04/20/2021 to [facility]/03/20[facility]  Medications stated during hospitalization: Jardiance 149mdaily, potsssium 20 mEq -  2 tablets daily and spironolactone 12.26m46maily. Medications stopped during hospitalization: silodosin 4mg78mapaflo), amlodipine 26mg,64mlcium carbonate, finasterine 26mg, 48mmaterene-HCTZ 75-50mg, 76mmin D Medications changed during hospitalization: metoprolol decreased from 226mg tw53ma day to 226mg onc13mday. Furosemide increased form 20 to 40mg daiy84m80mg daily18mObjective:  Lab Results  Component Value Date   CREATININE 2.06 (H) 05/24/2021   CREATININE 2.19 (H) 05/14/2021   CREATININE 2.23 (H) 05/09/2021    Lab Results  Component Value Date   HGBA1C 5.8 04/02/2021   Last diabetic Eye exam:  Lab Results  Component Value Date/Time   HMDIABEYEEXA No Retinopathy 08/08/2021 12:00 AM    Last diabetic Foot exam: No results found for: "HMDIABFOOTEX"      Component Value Date/Time   CHOL 125 04/02/2021 1456   TRIG 84.0 04/02/2021 1456   HDL 31.30 (L) 04/02/2021 1456   CHOLHDL 4 04/02/2021 1456   VLDL 16.8 04/02/2021 1456   LDLCALC 77 04/02/2021 1456   LDLCALC 101 (H) 12/16/2019 1543   LDLDIRECT 65.0 11/03/2018 1038        Latest Ref Rng & Units 04/20/2021    6:10 PM 04/09/2021    3:40 PM 04/02/2021    2:56 PM  Hepatic Function  Total Protein 6.5 - 8.1 g/dL 6.5  6.5  6.9   Albumin 3.5 - 5.0 g/dL 2.6  3.3  3.6   AST 15 - 41 U/L '23  22  28   ' ALT 0 - 44 U/L '17  18  16   ' Alk Phosphatase 38 - 126 U/L 65  84  77   Total Bilirubin 0.3 - 1.2 mg/dL 1.4  1.6  1.8   Bilirubin, Direct 0.0 - 0.2 mg/dL 0.5       Lab Results  Component Value Date/Time   TSH 4.58 04/02/2021 02:56 PM   TSH 4.55 11/30/2020 04:00 PM   FREET4 1.01 08/11/2020 04:25 PM       Latest Ref Rng & Units 05/24/2021    2:56 PM 05/14/2021   12:11 PM 05/03/2021    5:20 AM  CBC  WBC 3.4 - 10.8 x10E3/uL 6.3  5.6  6.7   Hemoglobin 13.0 - 17.7 g/dL 11.8  11.4  11.4   Hematocrit 37.5 - 51.0 % 36.4  35.7  35.3   Platelets 150 - 450 x10E3/uL 142  183.0  221     Lab Results  Component Value Date/Time  VD25OH 76.09 08/10/2020 03:13 PM   VD25OH 74.75 04/20/2020 11:06 AM    Clinical ASCVD: Yes  The ASCVD Risk score (Arnett DK, et al., 2019) failed to calculate for the following reasons:   The 2019 ASCVD risk score is only valid for ages 91 to 84     Social History   Tobacco Use  Smoking Status Never  Smokeless Tobacco Never   BP Readings from Last 3 Encounters:  06/22/21 127/68  06/19/21 136/64  05/28/21 124/62   Pulse Readings from Last 3 Encounters:  06/22/21 60  06/19/21 65  05/28/21 77   Wt Readings from Last 3 Encounters:  06/22/21 197 lb 8 oz (89.6 kg)  06/19/21 202 lb 1.6 oz (91.7 kg)  05/28/21 203 lb (92.1 kg)    Assessment: Review of patient past medical history, allergies, medications, health status, including review of consultants reports, laboratory and other test data, was performed as part of comprehensive evaluation and provision of chronic care management services.   SDOH:  (Social Determinants of Health) assessments and interventions performed:      CCM Care Plan  Allergies  Allergen Reactions    Atorvastatin     Myalgias / leg pain and weakness   Penicillins Hives and Itching    Has patient had a PCN reaction causing immediate rash, facial/tongue/throat swelling, SOB or lightheadedness with hypotension:Yes Has patient had a PCN reaction causing severe rash involving mucus membranes or skin necrosis:No Has patient had a PCN reaction that required hospitalization:No Has patient had a PCN reaction occurring within the last 10 years:No If all of the above answers are "NO", then may proceed with Cephalosporin use.    Verapamil     Junctional rhythm   Influenza Vaccines Rash    tinnitus    Medications Reviewed Today     Reviewed by Cherre Robins, RPH-CPP (Pharmacist) on 08/23/21 at 1152  Med List Status: <None>   Medication Order Taking? Sig Documenting Provider Last Dose Status Informant  acetaminophen (TYLENOL) 500 MG tablet [id number] Yes Take 500 mg by mouth every 6 (six) hours as needed for moderate pain. [provider] Taking Active Child  doxycycline (VIBRA-TABS) 100 MG tablet [id number] Yes Take 1 tablet (100 mg total) by mouth 2 (two) times daily. [place], [place] T, [place] Taking Active   ELIQUIS 2.5 MG TABS tablet [id number] Yes TAKE 1 TABLET BY MOUTH TWICE A DAY [name], MD Taking Active Child  empagliflozin (JARDIANCE) 10 MG TABS tablet [id number] Yes Take 1 tablet (10 mg total) by mouth daily. [name], MD Taking Active   furosemide (LASIX) 40 MG tablet [id number] Yes TAKE 2 TABLETS BY MOUTH EVERY DAY [name], MD Taking Active   glipiZIDE (GLUCOTROL) 10 MG tablet [id number] Yes TAKE 2 TABLETS BY MOUTH 2 TIMES DAILY BEFORE A MEAL.  Patient taking differently: Take 5 mg by mouth daily before breakfast.   [name], MD Taking Active   glucose blood (ONE TOUCH ULTRA TEST) test strip [id number] Yes USE 1 STRIP 2 TIMES DAILY TO CHECK BLOOD SUGAR DX E08.22, N18.3 [name], MD Taking Active Child  Krill Oil 1000 MG CAPS [id number] Yes Take 1,000 mg  by mouth daily. [provider] Taking Active Child  Multiple Vitamin (MULTIVITAMIN WITH MINERALS) TABS tablet [id number] Yes Take 1 tablet by mouth daily. [provider] Taking Active Child  sitaGLIPtin (JANUVIA) 25 MG tablet [id number] Yes Take 1 tablet (25 mg total) by mouth daily. [name], MD  Taking Active   tamsulosin (FLOMAX) 0.4 MG CAPS capsule [id number] Yes Take 1 capsule (0.4 mg total) by mouth at bedtime. Pixie Casino, MD Taking Active Child           Med Note Unk Lightning   Tue May 29, 2021 10:36 AM)    Zinc 50 MG TABS [id number] Yes Take 50 mg by mouth daily. [provider] Taking Active Child            Patient Active Problem List   Diagnosis Date Noted   Permanent atrial fibrillation ([place]) 05/24/2021   Malnutrition of moderate degree 04/27/2021   Contusion 04/02/2021   Unilateral primary osteoarthritis, right knee 02/01/2021   Fatty liver 12/01/2020   Pedal edema 08/14/2020   Knee pain, bilateral 09/20/2019   Morbid obesity ([place]) 09/14/2019   Urinary frequency 11/06/2016   S/P placement of cardiac pacemaker 10/24/2016   Chronic diastolic CHF (congestive heart failure) ([place]) 10/24/2016   Cardiac device in situ    Debility 08/15/2016   Vitamin D deficiency 07/17/2015   Preventative health care 07/17/2015   Lumbago 12/02/2014   Pain of toe of left foot 03/14/2014   Essential hypertension 01/13/2013   Insomnia 06/27/2011   Osteopenia 09/02/2010   Family history of colon cancer 09/02/2010   Diabetes mellitus with chronic kidney disease ([place])    Hyperlipidemia LDL goal <70    CTS (carpal tunnel syndrome) 08/28/2010    Immunization History  Administered Date(s) Administered   Fluad Quad(high Dose 65+) 01/16/2021   Pneumococcal Conjugate-13 03/23/2015   Pneumococcal Polysaccharide-23 06/27/2011   Tdap 05/19/2014    Conditions to be addressed/monitored: Atrial Fibrillation, CHF, CAD, HTN, HLD, DMII and osteopenia; CKD -  stage 3b  There are no care plans that you recently modified to display for this patient.    Medication Assistance: approved for patient assistance for Januvia in June 2022 through 03/17/2021.  Left application for Jardiance and Januvia at front desk for patient to sign when in office 05/14/2021.  Patient did not meet 3% out of pocket spend for Eliquis patient assistance program in 2022 but will follow in 2023.    Patient's preferred pharmacy is:  [id number] [place], [place] - [address] [place] [address]Phone: [phone]: [phone]   Follow Up:  Patient agrees to Care Plan and Follow-up.    Telephone follow up appointment with care management team member scheduled for:  4 to 6 weeks  TCherre Robins PharmD Clinical Pharmacist LThe [facility]Primary [facility] SW [facility]

## 2021-08-23 NOTE — Patient Instructions (Addendum)
[name], It was a pleasure speaking with you  Below is a summary of your health goals and care plan  Continue to take current medications; contact Philip, clinical pharmacist if you have questions or concerns about medications.  Continue to check blood glucose daily.  Home blood glucose goals Fasting blood glucose goal (before meals) = 80 to 130 Blood glucose goal after a meal = less than 180  We applied for Extra assistance with Medicare today. You should receive a letter in 10 to 14 days from [employer] stating if you were approved or denied for this extra assistance. Please call me when you receive this letter. If approved your medication costs will decrease but if denied we will need for forward a copy of the letter to the medication assistance program for Jardiance.  The Medication Assistance program for Eliquis requires you to spend 3% of your income on medications to qualify for their program. This amount should be $623.91 (per [facility] so far in 2023 you have spent $613.14. You will have to spend $10.77 more to qualify. I will check back with [facility] in 2 to 3 weeks.     If you have any questions or concerns, please feel free to contact me either at the phone number below or with a MyChart message.   Keep up the good work!  Cherre Robins, PharmD Clinical Pharmacist [facility] [phone] (direct line)  [phone] (main office number)   Gout diet: What's allowed, what's not Starting a gout diet? Understand which foods are OK and which to avoid. By [facility] Staff Gout is a painful form of arthritis that occurs when high levels of uric acid in the blood cause crystals to form and accumulate in and around a joint. Uric acid is produced when the body breaks down a chemical called purine. Purine occurs naturally in your body, but it's also found in certain foods. Uric acid is eliminated from the body in urine. A gout diet may help  decrease uric acid levels in the blood. A gout diet isn't a cure. But it may lower the risk of recurring gout attacks and slow the progression of joint damage. People with gout who follow a gout diet generally still need medication to manage pain and to lower levels of uric acid. Gout diet goals A gout diet is designed to help you: Achieve a healthy weight and good eating habits Avoid some, but not all, foods with purines Include some foods that can control uric acid levels A good rule of thumb is to eat moderate portions of healthy foods. Diet details The general principles of a gout diet follow typical healthy-diet recommendations: Weight loss. Being overweight increases the risk of developing gout, and losing weight lowers the risk of gout. Research suggests that reducing the number of calories and losing weight -- even without a purine-restricted diet -- lower uric acid levels and reduce the number of gout attacks. Losing weight also lessens the overall stress on joints. Complex carbs. Eat more fruits, vegetables and whole grains, which provide complex carbohydrates. Avoid foods and beverages with high-fructose corn syrup, and limit consumption of naturally sweet fruit juices. Water. Stay well-hydrated by drinking water. Fats. Cut back on saturated fats from red meat, fatty poultry and high-fat dairy products. Proteins. Focus on lean meat and poultry, low-fat dairy and lentils as sources of protein.   Foods that might increase uric acid: Organ and glandular meats. Avoid meats such as liver,  kidney and sweetbreads, which have high purine levels and contribute to high blood levels of uric acid. Red meat. Limit serving sizes of beef, lamb and pork. Seafood. Some types of seafood -- such as anchovies, shellfish (shrimp, oysters crab), sardines and tuna -- are higher in purines than are other types. But the overall health benefits of eating fish may outweigh the risks for people with gout.  Moderate portions of fish can be part of a gout diet. High-purine vegetables. Studies have shown that vegetables high in purines, such as asparagus and spinach, don't increase the risk of gout or recurring gout attacks. Alcohol. Beer and distilled liquors are associated with an increased risk of gout and recurring attacks. Moderate consumption of wine doesn't appear to increase the risk of gout attacks. Avoid alcohol during gout attacks, and limit alcohol, especially beer, between attacks. Sugary foods and beverages. Limit or avoid sugar-sweetened foods such as sweetened cereals, bakery goods and candies. Limit consumption of naturally sweet fruit juices. Foods that might lower gout risk: Vitamin C. Vitamin C may help lower uric acid levels. Talk to your doctor about whether a 500-milligram vitamin C supplement fits into your diet and medication plan. Coffee. Some research suggests that drinking coffee in moderation, especially regular caffeinated coffee, may be associated with a reduced risk of gout. Drinking coffee may not be appropriate if you have other medical conditions. Talk to your doctor about how much coffee is right for you. Cherries. There is some evidence that eating cherries is associated with a reduced risk of gout attacks.  Results Following a gout diet can help limit uric acid production and increase its elimination. A gout diet isn't likely to lower the uric acid concentration in your blood enough to treat your gout without medication. But it may help decrease the number of attacks and limit their severity. Following a gout diet, along with limiting calories and getting regular exercise, can also improve your overall health by helping you achieve and maintain a healthy weight.   The patient verbalized understanding of instructions, educational materials, and care plan provided today and agreed to receive a mailed copy of patient instructions, educational materials, and care plan.

## 2021-08-29 DIAGNOSIS — N1832 Chronic kidney disease, stage 3b: Secondary | ICD-10-CM | POA: Diagnosis not present

## 2021-08-29 DIAGNOSIS — I4891 Unspecified atrial fibrillation: Secondary | ICD-10-CM | POA: Diagnosis not present

## 2021-08-29 DIAGNOSIS — E559 Vitamin D deficiency, unspecified: Secondary | ICD-10-CM | POA: Diagnosis not present

## 2021-08-29 DIAGNOSIS — I509 Heart failure, unspecified: Secondary | ICD-10-CM | POA: Diagnosis not present

## 2021-08-29 DIAGNOSIS — L0390 Cellulitis, unspecified: Secondary | ICD-10-CM | POA: Diagnosis not present

## 2021-08-29 DIAGNOSIS — E1122 Type 2 diabetes mellitus with diabetic chronic kidney disease: Secondary | ICD-10-CM | POA: Diagnosis not present

## 2021-08-29 DIAGNOSIS — N179 Acute kidney failure, unspecified: Secondary | ICD-10-CM | POA: Diagnosis not present

## 2021-09-07 ENCOUNTER — Ambulatory Visit: Payer: PPO | Admitting: Pharmacist

## 2021-09-07 DIAGNOSIS — N183 Chronic kidney disease, stage 3 unspecified: Secondary | ICD-10-CM

## 2021-09-07 DIAGNOSIS — I4821 Permanent atrial fibrillation: Secondary | ICD-10-CM

## 2021-09-07 DIAGNOSIS — I5032 Chronic diastolic (congestive) heart failure: Secondary | ICD-10-CM

## 2021-09-07 DIAGNOSIS — I10 Essential (primary) hypertension: Secondary | ICD-10-CM

## 2021-09-07 MED ORDER — GLIPIZIDE 5 MG PO TABS: 5.0000 mg | ORAL_TABLET | Freq: Every day | ORAL | 1 refills | Status: DC

## 2021-09-11 ENCOUNTER — Ambulatory Visit: Payer: PPO | Admitting: Podiatry

## 2021-09-11 DIAGNOSIS — M778 Other enthesopathies, not elsewhere classified: Secondary | ICD-10-CM

## 2021-09-13 ENCOUNTER — Ambulatory Visit: Payer: PPO

## 2021-09-13 DIAGNOSIS — E1122 Type 2 diabetes mellitus with diabetic chronic kidney disease: Secondary | ICD-10-CM | POA: Diagnosis not present

## 2021-09-13 DIAGNOSIS — N1832 Chronic kidney disease, stage 3b: Secondary | ICD-10-CM | POA: Diagnosis not present

## 2021-09-13 DIAGNOSIS — D692 Other nonthrombocytopenic purpura: Secondary | ICD-10-CM | POA: Diagnosis not present

## 2021-09-13 DIAGNOSIS — Z7984 Long term (current) use of oral hypoglycemic drugs: Secondary | ICD-10-CM | POA: Diagnosis not present

## 2021-09-13 DIAGNOSIS — I129 Hypertensive chronic kidney disease with stage 1 through stage 4 chronic kidney disease, or unspecified chronic kidney disease: Secondary | ICD-10-CM | POA: Diagnosis not present

## 2021-09-14 ENCOUNTER — Ambulatory Visit: Payer: PPO | Admitting: Pharmacist

## 2021-09-14 DIAGNOSIS — I872 Venous insufficiency (chronic) (peripheral): Secondary | ICD-10-CM | POA: Diagnosis not present

## 2021-09-14 DIAGNOSIS — M8580 Other specified disorders of bone density and structure, unspecified site: Secondary | ICD-10-CM

## 2021-09-14 DIAGNOSIS — R351 Nocturia: Secondary | ICD-10-CM

## 2021-09-14 DIAGNOSIS — I4891 Unspecified atrial fibrillation: Secondary | ICD-10-CM | POA: Diagnosis not present

## 2021-09-14 DIAGNOSIS — Z7985 Long-term (current) use of injectable non-insulin antidiabetic drugs: Secondary | ICD-10-CM

## 2021-09-14 DIAGNOSIS — Z7984 Long term (current) use of oral hypoglycemic drugs: Secondary | ICD-10-CM

## 2021-09-14 DIAGNOSIS — E1159 Type 2 diabetes mellitus with other circulatory complications: Secondary | ICD-10-CM

## 2021-09-14 DIAGNOSIS — N401 Enlarged prostate with lower urinary tract symptoms: Secondary | ICD-10-CM | POA: Diagnosis not present

## 2021-09-14 DIAGNOSIS — I4821 Permanent atrial fibrillation: Secondary | ICD-10-CM

## 2021-09-14 DIAGNOSIS — I110 Hypertensive heart disease with heart failure: Secondary | ICD-10-CM | POA: Diagnosis not present

## 2021-09-14 DIAGNOSIS — I5032 Chronic diastolic (congestive) heart failure: Secondary | ICD-10-CM

## 2021-09-14 DIAGNOSIS — E0822 Diabetes mellitus due to underlying condition with diabetic chronic kidney disease: Secondary | ICD-10-CM

## 2021-09-14 DIAGNOSIS — E785 Hyperlipidemia, unspecified: Secondary | ICD-10-CM

## 2021-09-14 NOTE — Patient Instructions (Signed)
MR Baugher Please review your updated care plan thru [place].   If you have any questions or concerns, please feel free to contact me either at the phone number below or with a MyChart message.   Keep up the good work!  Cherre Robins, PharmD Clinical Pharmacist [facility] [place] [phone] (direct line)  [phone] (main office number)   No significant changes to care plan - patient can access thru MyChart. Today's visit was coordination with pharmacy and medication assistance program

## 2021-09-14 NOTE — Progress Notes (Signed)
Opened in error

## 2021-09-14 NOTE — Chronic Care Management (AMB) (Signed)
Chronic Care Management Pharmacy Note  09/14/2021 Name:  Vincent Allen MRN:  [id number] DOB:  1933-12-22  Summary:  Type 2 DM: Patient is taking glipizide 64m - 0.5 tablet = 597mdaily, Januvia 5055m 0.5 tablet = 51m7mily and Jardiance 10mg56mly. Last A1c was 5.8% on 04/02/2021.   Medication Management: checked to see if patient has been approved for patient assistance program for Jardiance - per [facility] patient must apply for LIS. Patient's son states patient received denial letter for LIS. Reminded patient's son to bring in or mail LIS denial letter to office so I can fax to [facility].  Patient assistance program for Januvia - still pending. Patient has received attestation letter from [facility] they have not received signed attestation forms yet (they need patient attest that although he has Medicare Part D, his cost for Januvia is too high and the he is in the coverage gap) LM for patient's son to remember to complete this paperwork.   Patient assistance program for Eliquis - needed updated out of pocket spend for 2023. When last checked patient was about $11 aware from required out of pocket spend of 3% of income. [facility] and patient has spent >$700. I have to request [facility] refax report of current 2023 out of pocket spend. Patient has spent $743 so far. Faxed to [employer] assistance program for review.   Follow up: PCP appointment 10/02/2021; Cardio appointment 09/25/2021; Clinical Pharmacist Practitioner will check back in 1 month regarding blood glucose  and patient assistance.    Subjective: Vincent ROZELLEn 88 y.88 year old male who is a primary patient of [name]  The CCM team was consulted for assistance with disease management and care coordination needs.    Engaged with patient and son by telephone for follow up visit in response to provider referral for pharmacy case management  and/or care coordination services.   Consent to Services:  The patient was given information about Chronic Care Management services, agreed to services, and gave verbal consent prior to initiation of services.  Please see initial visit note for detailed documentation.   Patient Care Team: BlythMosie Lukesas PCP - General (Family Medicine) HiltyDebara PicketteNadean Corwinas PCP - Cardiology (Cardiology) KleinDeboraha Sprangas PCP - Electrophysiology (Cardiology) GroatClent Jacksas Consulting Physician (Ophthalmology) JonesDanella Sensingas Consulting Physician (Dermatology) NelsoDorothy Sparkas Consulting Physician (Cardiology) WhitfGarald Baldingas Consulting Physician (Orthopedic Surgery) Regal, NormaTamala Fothergill as Consulting Physician (Podiatry) EckarCherre Robins-CPP (Pharmacist)  Recent Office Visits:  07/11/2021 - Fam Med (Dr BlythCharlett Blakene Call. Son reports weight loss from 198 to 187 over the last 3 weeks. Eating habits the same. Nutrition consult offered. Suggested add 2 meal supplements of Boost or Endure daily. Appt in 4 weeks with PCP. 06/19/2021 - Fam Med (Dr BlythCharlett Blakeventative health visit and F/U chronic conditions. Metoprolol 51mg 92mpotassium was removed from patient's med list during appointment but not reason stated.  05/14/2021 - Fam Med (Beck,Olevia BowensSeen for hospital follow up. No med changes noted. Checked CBC and CMP - Scr was up a little at 2.19 and eGFR was 26.75. 03/23/2021 - Fam Med (Dr [name])Charlett Blakediabetes; patient experience 2 hypoglycemia events recently. Lowered dose of glipizide to 5mg tw46m a day. Labs checked - showed worsening renal function. Stopped triamterene/HCTZ. rechecked renal  funciton in 1 week. Referral to nephrology.  Recent Consults:  09/11/2021 - Podiatry (Dr Milinda Pointer) follow-up of capsulitis dorsal aspect of left foot. Resolving gouty capsulitis. No med changes noted. 07/13/2021 - Podiatry - Diabetic foot care.  07/04/2021 - Cardio Rollene Rotunda, RN) CHF  clinic - wound check post pacemaker placement.  05/28/2021 - Cardio Sarajane Jews, [facility]) follow up. No med changes noted.  05/24/2021 - Cardio (Dr Caryl Comes) F/U CHF and afib with pacemaker. Discussed CRTP implantation. Patient to hold ELiquis prior to procedure. Discontinued metoprolol (for now) due to low BP. 05/11/2021 - Cardio (Transition of Care Team - [facility]mm[facility], [facility]) Seen for CHF hospital follow up. No medication changes noted.  05/10/2021 - Cardio (Dr Debara Pickett) F/U afib and CHF. Started tamsulosin 0.47m nightly for BPH. Labs checked.  04/20/2021 - Nephrology ([facility]) no notes available. Patient sent to hospital due to suspected CHF exacerbation.  [facility]: [facility] by Dr KCaryl Comes   04/20/2021 to [facility]  Medications stated during hospitalization: Jardiance 171mdaily, potsssium 20 mEq -  2 tablets daily and spironolactone 12.33m68maily. Medications stopped during hospitalization: silodosin 4mg48mapaflo), amlodipine 33mg,48mlcium carbonate, finasterine 33mg, 19mmaterene-HCTZ 75-50mg, 39mmin D Medications changed during hospitalization: metoprolol decreased from 233mg tw3ma day to 233mg onc59mday. Furosemide increased form 20 to 40mg daiy60m80mg daily49mObjective:  Lab Results  Component Value Date   CREATININE 2.06 (H) 05/24/2021   CREATININE 2.19 (H) 05/14/2021   CREATININE 2.23 (H) 05/09/2021    Lab Results  Component Value Date   HGBA1C 5.8 04/02/2021   Last diabetic Eye exam:  Lab Results  Component Value Date/Time   HMDIABEYEEXA No Retinopathy 08/08/2021 12:00 AM    Last diabetic Foot exam: No results found for: "HMDIABFOOTEX"      Component Value Date/Time   CHOL 125 04/02/2021 1456   TRIG 84.0 04/02/2021 1456   HDL 31.30 (L) 04/02/2021 1456   CHOLHDL 4 04/02/2021 1456   VLDL 16.8 04/02/2021 1456   LDLCALC 77 04/02/2021 1456   LDLCALC 101  (H) 12/16/2019 1543   LDLDIRECT 65.0 11/03/2018 1038       Latest Ref Rng & Units 04/20/2021    6:10 PM 04/09/2021    3:40 PM 04/02/2021    2:56 PM  Hepatic Function  Total Protein 6.5 - 8.1 g/dL 6.5  6.5  6.9   Albumin 3.5 - 5.0 g/dL 2.6  3.3  3.6   AST 15 - 41 U/L '23  22  28   ' ALT 0 - 44 U/L '17  18  16   ' Alk Phosphatase 38 - 126 U/L 65  84  77   Total Bilirubin 0.3 - 1.2 mg/dL 1.4  1.6  1.8   Bilirubin, Direct 0.0 - 0.2 mg/dL 0.5       Lab Results  Component Value Date/Time   TSH 4.58 04/02/2021 02:56 PM   TSH 4.55 11/30/2020 04:00 PM   FREET4 1.01 08/11/2020 04:25 PM       Latest Ref Rng & Units 05/24/2021    2:56 PM 05/14/2021   12:11 PM 05/03/2021    5:20 AM  CBC  WBC 3.4 - 10.8 x10E3/uL 6.3  5.6  6.7   Hemoglobin 13.0 - 17.7 g/dL 11.8  11.4  11.4   Hematocrit 37.5 - 51.0 % 36.4  35.7  35.3   Platelets 150 - 450 x10E3/uL 142  183.0  221     Lab Results  Component Value Date/Time   VD25OH 76.09 08/10/2020 03:13 PM   VD25OH 74.75 04/20/2020 11:06 AM    Clinical ASCVD: Yes  The ASCVD Risk score (Arnett DK, et al., 2019) failed to calculate for the following reasons:   The 2019 ASCVD risk score is only valid for ages 14 to 44     Social History   Tobacco Use  Smoking Status Never  Smokeless Tobacco Never   BP Readings from Last 3 Encounters:  06/22/21 127/68  06/19/21 136/64  05/28/21 124/62   Pulse Readings from Last 3 Encounters:  06/22/21 60  06/19/21 65  05/28/21 77   Wt Readings from Last 3 Encounters:  06/22/21 197 lb 8 oz (89.6 kg)  06/19/21 202 lb 1.6 oz (91.7 kg)  05/28/21 203 lb (92.1 kg)    Assessment: Review of patient past medical history, allergies, medications, health status, including review of consultants reports, laboratory and other test data, was performed as part of comprehensive evaluation and provision of chronic care management services.   SDOH:  (Social Determinants of Health) assessments and interventions performed:        CCM Care Plan  Allergies  Allergen Reactions   Atorvastatin     Myalgias / leg pain and weakness   Penicillins Hives and Itching    Has patient had a PCN reaction causing immediate rash, facial/tongue/throat swelling, SOB or lightheadedness with hypotension:Yes Has patient had a PCN reaction causing severe rash involving mucus membranes or skin necrosis:No Has patient had a PCN reaction that required hospitalization:No Has patient had a PCN reaction occurring within the last 10 years:No If all of the above answers are "NO", then may proceed with Cephalosporin use.    Verapamil     Junctional rhythm   Influenza Vaccines Rash    tinnitus    Medications Reviewed Today     Reviewed by Cherre Robins, RPH-CPP (Pharmacist) on 09/14/21 at [phone]  Med List Status: <None>   Medication Order Taking? Sig Documenting Provider Last Dose Status Informant  acetaminophen (TYLENOL) 500 MG tablet [id number] No Take 500 mg by mouth every 6 (six) hours as needed for moderate pain. [provider] Taking Active Child  ELIQUIS 2.5 MG TABS tablet [id number] No TAKE 1 TABLET BY MOUTH TWICE A DAY [name], MD Taking Active Child  empagliflozin (JARDIANCE) 10 MG TABS tablet [id number] No Take 1 tablet (10 mg total) by mouth daily. [name], MD Taking Active   furosemide (LASIX) 40 MG tablet [id number] No TAKE 2 TABLETS BY MOUTH EVERY DAY  Patient taking differently: Take 40 mg by mouth daily.   [name], MD Taking Active   glipiZIDE (GLUCOTROL) 5 MG tablet [id number]  Take 1 tablet (5 mg total) by mouth daily before breakfast. [name], MD  Active            Med Note [facility] [place] B   Fri Sep 07, 2021 10:19 AM) Has 77m glipizide current taking 0.5 tablet daily  glucose blood (ONE TOUCH ULTRA TEST) test strip [id number] USE 1 STRIP 2 TIMES DAILY TO CHECK BLOOD SUGAR DX E08.22, N18.3 BMosie Lukes MD Taking Active Child  Krill Oil 1000 MG CAPS [id number] Take  1,000 mg by mouth daily. [provider] Taking Active Child  Multiple Vitamin (MULTIVITAMIN WITH MINERALS) TABS tablet [id number] Take 1 tablet by mouth daily. [provider] Taking Active Child  sitaGLIPtin (JANUVIA)  25 MG tablet [id number] No Take 1 tablet (25 mg total) by mouth daily. [name], MD Taking Active   tamsulosin [facility]) 0.4 MG CAPS capsule [id number] No Take 1 capsule (0.4 mg total) by mouth at bedtime. Pixie Casino, MD Taking Active Child           Med Note Antony Contras, [place] May 29, 2021 10:36 AM)    Zinc 50 MG TABS [id number] No Take 50 mg by mouth daily. [provider] Taking Active Child            Patient Active Problem List   Diagnosis Date Noted   Permanent atrial fibrillation ([place]) 05/24/2021   Malnutrition of moderate degree 04/27/2021   Contusion 04/02/2021   Unilateral primary osteoarthritis, right knee 02/01/2021   Fatty liver 12/01/2020   Pedal edema 08/14/2020   Knee pain, bilateral 09/20/2019   Morbid obesity ([place]) 09/14/2019   Urinary frequency 11/06/2016   S/P placement of cardiac pacemaker 10/24/2016   Chronic diastolic CHF (congestive heart failure) ([place]) 10/24/2016   Cardiac device in situ    Debility 08/15/2016   Vitamin D deficiency 07/17/2015   Preventative health care 07/17/2015   Lumbago 12/02/2014   Pain of toe of left foot 03/14/2014   Essential hypertension 01/13/2013   Insomnia 06/27/2011   Osteopenia 09/02/2010   Family history of colon cancer 09/02/2010   Diabetes mellitus with chronic kidney disease ([place])    Hyperlipidemia LDL goal <70    CTS (carpal tunnel syndrome) 08/28/2010    Immunization History  Administered Date(s) Administered   Fluad Quad(high Dose 65+) 01/16/2021   Pneumococcal Conjugate-13 03/23/2015   Pneumococcal Polysaccharide-23 06/27/2011   Tdap 05/19/2014    Conditions to be addressed/monitored: Atrial Fibrillation, CHF, CAD, HTN, HLD, DMII and osteopenia;  CKD - stage 3b  Care Plan : General Pharmacy (Adult)  Updates made by Cherre Robins, RPH-CPP since 09/14/2021 12:00 AM     Problem: Provide education, support and care coordination for medication therapy and chronic conditions   Priority: High  Onset Date: 07/13/2020  Note:   CARE PLAN ENTRY   Current Barriers:  Decreasing renal function - evaluate for need to adjust medications Not checking blood pressure or weight daily - goal met  Needs assistance with cost of medications - reaches coverage gap   Hypertension / High Blood pressure  BP goal <140/90 - Controlled Current regimen:  Metoprolol succinate 54m daily  Previous meds tried: triamterene-hydrochlorothiazide (stopped due to increase in Scr), amlodipine - stopped due to edema Checks blood pressure at home: SBP 100 to 120 DBP 55-65 HR 60's Interventions:  Continue current hypertension therapy Continue to check blood pressure 2 to 3 times per week and record. Reviewed blood pressure goal  Chronic Heart Failure (HFrEF) / chronic venous insufficiency:  Recent exacerbation requiring hospitalization for 2 weeks;  Goal: prevention of CHF exacerbations and minimize symptoms of CHF. Current Regimen:  Furosemide 453m- take 2 tablets = 807maily each morning  Metoprolol succinate ER 65m52mce a day  Jardiance 10mg65mly   Last EF = 40-45% Previous medication tried: spironolactone - stopped due ot Scr increase and elevated potassium Patient is weighing every day now. Today's weight was 188 at home Denies shortness of breath; little edema  Wearing compression hose Potassium was discontinued 05/10/2021 - Last serum potassium was 3.7 (05/24/2021). Patient complains of some weakness and twitching in him legs. He is worried that potassium could be low.  Interventions:  Discussed signs and symptoms of CHF exacerbation - weight gain, SOB, abdominal fullness, swelling in legs or abdomen, Fatigue and weakness, changes in ability to  perform usual activities, persistent cough or wheezing with white or pink blood-tinged mucus, nausea and lack of appetite Continue to weigh daily - report weight gain of more than 3 lbs in 24 hours or 5 lbs in 1 week. Followed up patient assistance program applications; provided additional information needed.    Hyperlipidemia  LDL goal < 70 - not currently at goal (last LDL was 86)  Tried atorvastatin in past - caused leg pain and weakness Current regimen:  Diet and exercise management   Krill Oil 1026m daily Interventions: (addressed at previous visit)  Consider retrial of alternative statin like  rosuvastatin 520mdaily - patient declined in past Recommend limiting intake of food high in saturated fat / avoid fried foods; increase intake of vegetables Continue Krill Oil  Diabetes Lab Results  Component Value Date   HGBA1C 5.8 04/02/2021  A1c goal < 7.0%; A1c is at goal Current regimen:  Januvia 2554mnce a day (taking 0.5 tablet of 53m52mardiance 10mg9mly (though at current Scr might not be getting much blood glucose lowering from Jardiance) Glipizide 10mg 77mke 0.5 tablets = 5mg da72m Checks BG daily: usually is 90 to 110 Did have blood glucose in hospital of 77 and 76 on 06/22/2021 Patient received Januvia from patient assistance program in 2022. Patient reports he received 2023 [id number] for patient assistance program for Januvia and he mailed back to the patient assistance program program however at previous visit I spoke with representative at [employer] had not received application as of 01/31/235/32/9924ventions: Continue lower dose of Januvia to 25mg da58mbased on recent decreased in eGFR. (Updated Rx sent to pharmacy again, requested discontinue 53mg pre30mption)  Updated prescription for glipizide for 5mg table75m- take 1 tablet with breakfast (consider decreasing dose in next A1c still < 6.5% Continue to check blood glucose / sugar 1 to 2 times per day. Notify  PCP office if experience blood glucose < 80 Consider less aggressive A1c goal of <7.5% given patient's age and CVD history.  Reviewed home blood glucose readings and reviewed goals  Fasting blood glucose goal (before meals) = 80 to 130 Blood glucose goal after a meal = less than 180  Reviewed how to treat hypoglycemia  Atrial fibrillation  Current regimen:  Eliquis 2.5mg twice 31mly in morning and with dinner Metoprolol succinate 25mg once a32m Patient did reach coverage gap in 2022 but not 3% out of pocket spend to apply for [employer] assistance program for 2022. Benefits have reset for 2023.  Interventions:   Maintain current atrial fibrillation medication regimen applied for patient assistance program for Eliquis. [facility] that [facility] has now met out of pocket spend for 2023 of 3% of income. Submitted additional information on out of pocket spend request by patient assistance program.    Decreased Kidney Function:  Lab Results  Component Value Date   NA 143 05/24/2021   K 3.7 05/24/2021   CO2 23 05/24/2021   GLUCOSE 140 (H) 05/24/2021   BUN 45 (H) 05/24/2021   CREATININE 2.06 (H) 05/24/2021   CALCIUM 9.0 05/24/2021   EGFR 31 (L) 05/24/2021   GFRNONAA 41 (L) 05/03/2021  Current regimen:  Jardiance 10mg daily I68mventions: Reviewed medications to see if any need adjustment based on current renal function.   Continue with  plan to follow up with nephrologist Avoid over the counter pain relievers like Aleve / naproxen; Motrin / ibuprofen. If need medication for pain relief, headaches or fever - Tylenol / acetaminophen 565m up to 2 tablets every 8 hours is safer option.   Osteopenia:  Last DEXA 12/2019 Lowest T-Score = -2.4 at left femur neck ;  Right femur neck = -1.4 History of fracture of foot and shoulder. Per patient sustained from fall from standing position     06/19/2021   11:43 AM 01/01/2021    1:15 PM 07/13/2020   11:38 AM  10/06/2019    1:53 PM 09/14/2019    9:46 AM  Fall Risk   Falls in the past year? 0 0 0 0 0  Number falls in past yr: 0 0 0 0   Injury with Fall? 0 0 0 0   Risk for fall due to : Impaired balance/gait;Impaired mobility No Fall Risks History of fall(s);Impaired balance/gait    Follow up Falls evaluation completed Falls prevention discussed Falls prevention discussed;Falls evaluation completed Education provided;Falls prevention discussed   Interventions: (addressed at previous visit) Continue to take calcium + D daily Discussed fall prevention Continue to use cane for ambulation assistance Consider recheck DEXA in 2023  BPH / nocturia:  Not controlled Patient previously took Rapaflo (not effective) and finasteride (patient unsure why stopped) Current therapy:  Tamsulosin 0.422mdaily Interventions:  Continue tamsulosin Continue with plan to follow up with nephrologist and urologist   Medication management Pharmacist Clinical Goal(s): Over the next 90 days, patient will work with PharmD and providers to achieve optimal medication adherence [facility] Interventions Comprehensive medication review performed. Continue current medication management strategy Assisted patient in applying for LIS at last visit; confirmed with his son today, 09/07/2021 that patient was denied LIS / Extra Help. He will bring letter by office as BI[facility] for [facility] assistance program is requiring proof of LIS denial.  Please see past updates related to this goal by clicking on the "Past Updates" button in the selected goal       Medication Assistance: approved for patient assistance for Januvia in June 2022 through 03/17/2021.  Left application for Jardiance and Januvia at front desk for patient to sign when in office 05/14/2021.  Patient did not meet 3% out of pocket spend for Eliquis patient assistance program in 2022 but will follow in 2023.    Patient's [facility] pharmacy  is[facility]  [id number] [place] [facility] [address] [place] [id number]: [phone]: [phone] Follow Up:  Patient agrees to Care Plan and Follow-up.    Telephone follow up appointment with care management team member scheduled for:  4 to 6 weeks  TamCherre RobinsharmD Clinical Pharmacist [facility]

## 2021-09-21 ENCOUNTER — Ambulatory Visit (INDEPENDENT_AMBULATORY_CARE_PROVIDER_SITE_OTHER): Payer: PPO

## 2021-09-21 DIAGNOSIS — I420 Dilated cardiomyopathy: Secondary | ICD-10-CM | POA: Diagnosis not present

## 2021-09-22 LAB — CUP PACEART REMOTE DEVICE CHECK
Battery Remaining Longevity: 125 mo
Battery Voltage: 3.06 V
Brady Statistic RA Percent Paced: 0 %
Brady Statistic RV Percent Paced: 98.32 %
Date Time Interrogation Session: 20230707170341
Implantable Lead Implant Date: 20180706
Implantable Lead Implant Date: 20180706
Implantable Lead Implant Date: 20230407
Implantable Lead Location: 753858
Implantable Lead Location: 753859
Implantable Lead Location: 753860
Implantable Lead Model: 5076
Implantable Lead Model: 5076
Implantable Pulse Generator Implant Date: 20230407
Lead Channel Impedance Value: 1064 Ohm
Lead Channel Impedance Value: 1102 Ohm
Lead Channel Impedance Value: 304 Ohm
Lead Channel Impedance Value: 361 Ohm
Lead Channel Impedance Value: 399 Ohm
Lead Channel Impedance Value: 437 Ohm
Lead Channel Impedance Value: 456 Ohm
Lead Channel Impedance Value: 513 Ohm
Lead Channel Impedance Value: 589 Ohm
Lead Channel Impedance Value: 684 Ohm
Lead Channel Impedance Value: 836 Ohm
Lead Channel Impedance Value: 893 Ohm
Lead Channel Impedance Value: 969 Ohm
Lead Channel Impedance Value: 969 Ohm
Lead Channel Pacing Threshold Amplitude: 0.75 V
Lead Channel Pacing Threshold Amplitude: 1.25 V
Lead Channel Pacing Threshold Pulse Width: 0.4 ms
Lead Channel Pacing Threshold Pulse Width: 0.8 ms
Lead Channel Sensing Intrinsic Amplitude: 1.5 mV
Lead Channel Sensing Intrinsic Amplitude: 1.5 mV
Lead Channel Sensing Intrinsic Amplitude: 5.875 mV
Lead Channel Sensing Intrinsic Amplitude: 5.875 mV
Lead Channel Setting Pacing Amplitude: 1.75 V
Lead Channel Setting Pacing Amplitude: 2 V
Lead Channel Setting Pacing Pulse Width: 0.4 ms
Lead Channel Setting Pacing Pulse Width: 0.8 ms
Lead Channel Setting Sensing Sensitivity: 1.2 mV

## 2021-09-25 ENCOUNTER — Encounter: Payer: PPO | Admitting: Internal Medicine

## 2021-09-27 ENCOUNTER — Other Ambulatory Visit (INDEPENDENT_AMBULATORY_CARE_PROVIDER_SITE_OTHER): Payer: PPO

## 2021-09-27 ENCOUNTER — Other Ambulatory Visit: Payer: Self-pay

## 2021-09-27 DIAGNOSIS — D649 Anemia, unspecified: Secondary | ICD-10-CM

## 2021-09-27 DIAGNOSIS — I4821 Permanent atrial fibrillation: Secondary | ICD-10-CM

## 2021-09-27 DIAGNOSIS — N183 Chronic kidney disease, stage 3 unspecified: Secondary | ICD-10-CM

## 2021-09-27 DIAGNOSIS — I5032 Chronic diastolic (congestive) heart failure: Secondary | ICD-10-CM | POA: Diagnosis not present

## 2021-09-27 DIAGNOSIS — E785 Hyperlipidemia, unspecified: Secondary | ICD-10-CM | POA: Diagnosis not present

## 2021-09-27 DIAGNOSIS — I10 Essential (primary) hypertension: Secondary | ICD-10-CM | POA: Diagnosis not present

## 2021-09-27 DIAGNOSIS — E0822 Diabetes mellitus due to underlying condition with diabetic chronic kidney disease: Secondary | ICD-10-CM | POA: Diagnosis not present

## 2021-09-27 LAB — HEMOGLOBIN A1C: Hgb A1c MFr Bld: 6.7 % — ABNORMAL HIGH (ref 4.6–6.5)

## 2021-09-27 LAB — CBC
HCT: 43.1 % (ref 39.0–52.0)
Hemoglobin: 13.7 g/dL (ref 13.0–17.0)
MCHC: 31.8 g/dL (ref 30.0–36.0)
MCV: 85 fl (ref 78.0–100.0)
Platelets: 175 10*3/uL (ref 150.0–400.0)
RBC: 5.07 Mil/uL (ref 4.22–5.81)
RDW: 14.9 % (ref 11.5–15.5)
WBC: 7.4 10*3/uL (ref 4.0–10.5)

## 2021-09-27 LAB — COMPREHENSIVE METABOLIC PANEL
ALT: 13 U/L (ref 0–53)
AST: 17 U/L (ref 0–37)
Albumin: 3.5 g/dL (ref 3.5–5.2)
Alkaline Phosphatase: 75 U/L (ref 39–117)
BUN: 34 mg/dL — ABNORMAL HIGH (ref 6–23)
CO2: 23 mEq/L (ref 19–32)
Calcium: 9.5 mg/dL (ref 8.4–10.5)
Chloride: 104 mEq/L (ref 96–112)
Creatinine, Ser: 1.82 mg/dL — ABNORMAL HIGH (ref 0.40–1.50)
GFR: 33.3 mL/min — ABNORMAL LOW (ref >60.00)
Glucose, Bld: 94 mg/dL (ref 70–99)
Potassium: 3.8 mEq/L (ref 3.5–5.1)
Sodium: 138 mEq/L (ref 135–145)
Total Bilirubin: 0.9 mg/dL (ref 0.2–1.2)
Total Protein: 6.8 g/dL (ref 6.0–8.3)

## 2021-09-27 LAB — LIPID PANEL
Cholesterol: 161 mg/dL (ref 0–200)
HDL: 41.3 mg/dL (ref >39.00)
LDL Cholesterol: 102 mg/dL — ABNORMAL HIGH (ref 0–99)
NonHDL: 119.98
Total CHOL/HDL Ratio: 4
Triglycerides: 90 mg/dL (ref 0.0–149.0)
VLDL: 18 mg/dL (ref 0.0–40.0)

## 2021-09-27 LAB — TSH: TSH: 3.74 u[IU]/mL (ref 0.35–5.50)

## 2021-09-27 LAB — BRAIN NATRIURETIC PEPTIDE: Pro B Natriuretic peptide (BNP): 889 pg/mL — ABNORMAL HIGH (ref 0.0–100.0)

## 2021-09-27 MED ORDER — FUROSEMIDE 40 MG PO TABS: ORAL_TABLET | ORAL | 0 refills | Status: DC

## 2021-10-01 ENCOUNTER — Telehealth: Payer: Self-pay | Admitting: Family Medicine

## 2021-10-01 NOTE — Telephone Encounter (Signed)
Shanon Brow (son) called to follow up on assistance info for pt's meds that Tammy was looking into. He would like a call back with any info available when possible.

## 2021-10-01 NOTE — Progress Notes (Signed)
Subjective:    Patient ID: Vincent Allen, male    DOB: January 15, 1934, 88 y.o.   MRN: [id number]  Chief Complaint  Patient presents with   Follow-up    HPI Patient is in today for a follow up on chronic medical concerns. No recent febrile illness or acute hospitalizations. Last week his labs revealed an elevated BNP and his Lasix was increased to 80 mg daily for several days. He lost about 6 pounds but he felt weak and worn out. Denies CP/palp/SOB/HA/congestion/fevers/GI or GU c/o. Taking meds as prescribed   Past Medical History:  Diagnosis Date   Arthritis    Bradycardia    Cataract    Chronic kidney disease stage III (GFR 30-59 ml/min) 05/03/2012   Colon polyps    Complication of anesthesia    emesis   Diabetes mellitus    GERD (gastroesophageal reflux disease)    HOH (hard of hearing)    HTN (hypertension) 01/13/2013   Hyperlipidemia    Hypertension    Pacemaker 12/2016   Persistent atrial fibrillation (HCC)    Presence of permanent cardiac pacemaker 09/20/2016   Symptomatic bradycardia    Vitamin D deficiency 07/17/2015    Past Surgical History:  Procedure Laterality Date   ANKLE ARTHROPLASTY     BIV PACEMAKER INSERTION CRT-P N/A 06/22/2021   Procedure: UPGRADE TO BIV PACEMAKER INSERTION CRT-P;  Surgeon: Duke Salvia, MD;  Location: [facility] INVASIVE CV LAB;  Service: Cardiovascular;  Laterality: N/A;   CARDIOVERSION N/A 08/20/2016   Procedure: CARDIOVERSION;  Surgeon: Thurmon Fair, MD;  Location: [facility] ENDOSCOPY;  Service: Cardiovascular;  Laterality: N/A;   CATARACT EXTRACTION  2010, 2014   CATARACT EXTRACTION  02/28/14   CHOLECYSTECTOMY     CHOLECYSTECTOMY N/A 05/03/2012   Procedure: LAPAROSCOPIC CHOLECYSTECTOMY WITH INTRAOPERATIVE CHOLANGIOGRAM;  Surgeon: Cherylynn Ridges, MD;  Location: [facility] OR;  Service: General;  Laterality: N/A;   INSERT / REPLACE / REMOVE PACEMAKER  09/20/2016   PACEMAKER IMPLANT N/A 09/20/2016   Procedure: Pacemaker Implant;  Surgeon: Duke Salvia,  MD;  [facility] INVAS[facility] CV LAB;  Service: Cardiovascular;  Laterality: N/A;   ROTATOR CUFF REPAIR     SHOULDER SURGERY     TONSILLECTOMY      Family History  Problem Relation Age of Onset   Cancer Mother        colon, breast, pancreas, skin cancer   Heart disease Father        heart valve replaced   Stroke Father    Diabetes Father    Cancer Paternal Grandmother        colon   Obesity Son    Heart disease Son        bradycardia    Social History   Socioeconomic History   Marital status: Widowed    Spouse name: Not on file   Number of children: Not on file   Years of education: Not on file   Highest education level: Not on file  Occupational History   Not on file  Tobacco Use   Smoking status: Never   Smokeless tobacco: Never  Vaping Use   Vaping Use: Never used  Substance and Sexual Activity   Alcohol use: Yes    Alcohol/week: 1.0 standard drink of alcohol    Types: 1 Glasses of wine per week   Drug use: No   Sexual activity: Not on file    Comment: lives alone , no major dietary restrictions, 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  Other Topics Concern   Not on file  Social History Narrative   Not on file   Social Determinants of Health   Financial Resource Strain: Medium Risk (09/07/2021)   Overall Financial Resource Strain (CARDIA)    Difficulty of Paying Living Expenses: Somewhat hard  Food Insecurity: No Food Insecurity (01/01/2021)   Hunger Vital Sign    Worried About Running Out of Food in the Last Year: Never true    Ran Out of Food in the Last Year: Never true  Transportation Needs: No Transportation Needs (01/01/2021)   PRAPARE - Administrator, Civil Service (Medical): No    Lack of Transportation (Non-Medical): No  Physical Activity: Insufficiently Active (01/01/2021)   Exercise Vital Sign    Days of Exercise per Week: 7 days    Minutes of Exercise per Session: 10 min  Stress: No Stress Concern Present  (01/01/2021)   [employer] of Occupational Health - Occupational Stress Questionnaire    Feeling of Stress : Not at all  Social Connections: Socially Isolated (01/01/2021)   Social Connection and Isolation Panel [NHANES]    Frequency of Communication with Friends and Family: More than three times a week    Frequency of Social Gatherings with Friends and Family: Three times a week    Attends Religious Services: Never    Active Member of Clubs or Organizations: No    Attends Banker Meetings: Never    Marital Status: Widowed  Intimate Partner Violence: Not At Risk (01/01/2021)   Humiliation, Afraid, Rape, and Kick questionnaire    Fear of Current or Ex-Partner: No    Emotionally Abused: No    Physically Abused: No    Sexually Abused: No    Outpatient Medications Prior to Visit  Medication Sig Dispense Refill   acetaminophen (TYLENOL) 500 MG tablet Take 500 mg by mouth every 6 (six) hours as needed for moderate pain.     ELIQUIS 2.5 MG TABS tablet TAKE 1 TABLET BY MOUTH TWICE A DAY 60 tablet 5   empagliflozin (JARDIANCE) 10 MG TABS tablet Take 1 tablet (10 mg total) by mouth daily. 90 tablet 1   glipiZIDE (GLUCOTROL) 5 MG tablet Take 1 tablet (5 mg total) by mouth daily before breakfast. 90 tablet 1   glucose blood (ONE TOUCH ULTRA TEST) test strip USE 1 STRIP 2 TIMES DAILY TO CHECK BLOOD SUGAR DX E08.22, N18.3 300 each 1   Krill Oil 1000 MG CAPS Take 1,000 mg by mouth daily.     Multiple Vitamin (MULTIVITAMIN WITH MINERALS) TABS tablet Take 1 tablet by mouth daily.     sitaGLIPtin (JANUVIA) 25 MG tablet Take 1 tablet (25 mg total) by mouth daily. 30 tablet 2   tamsulosin (FLOMAX) 0.4 MG CAPS capsule Take 1 capsule (0.4 mg total) by mouth at bedtime. 90 capsule 3   Zinc 50 MG TABS Take 50 mg by mouth daily.     furosemide (LASIX) 40 MG tablet TAKE 40MG  TWICE DAILY FOR 5 DAYS, THEN 40MG  ONCE DAILY 180 tablet 0   No facility-administered medications prior to visit.     Allergies  Allergen Reactions   Atorvastatin     Myalgias / leg pain and weakness   Penicillins Hives and Itching    Has patient had a PCN reaction causing immediate rash, facial/tongue/throat swelling, SOB or lightheadedness with hypotension:Yes Has patient had a PCN reaction causing severe rash involving mucus membranes or skin necrosis:No Has patient  had a PCN reaction that required hospitalization:No Has patient had a PCN reaction occurring within the last 10 years:No If all of the above answers are "NO", then may proceed with Cephalosporin use.    Verapamil     Junctional rhythm   Influenza Vaccines Rash    tinnitus    Review of Systems  Constitutional:  Positive for malaise/fatigue. Negative for fever.  HENT:  Negative for congestion.   Eyes:  Negative for blurred vision.  Respiratory:  Negative for shortness of breath.   Cardiovascular:  Negative for chest pain, palpitations and leg swelling.  Gastrointestinal:  Negative for abdominal pain, blood in stool and nausea.  Genitourinary:  Negative for dysuria and frequency.  Musculoskeletal:  Negative for falls.  Skin:  Negative for rash.  Neurological:  Negative for dizziness, loss of consciousness and headaches.  Endo/Heme/Allergies:  Negative for environmental allergies.  Psychiatric/Behavioral:  Negative for depression. The patient is not nervous/anxious.        Objective:    Physical Exam Constitutional:      General: He is not in acute distress.    Appearance: Normal appearance. He is not ill-appearing or toxic-appearing.  HENT:     Head: Normocephalic and atraumatic.     Right Ear: External ear normal.     Left Ear: External ear normal.     Nose: Nose normal.  Eyes:     General:        Right eye: No discharge.        Left eye: No discharge.  Cardiovascular:     Rate and Rhythm: Normal rate.  Pulmonary:     Effort: Pulmonary effort is normal.  Skin:    Findings: No rash.  Neurological:     Mental  Status: He is alert and oriented to person, place, and time.  Psychiatric:        Behavior: Behavior normal.     BP 124/88 (BP Location: Left Arm, Patient Position: Sitting, Cuff Size: Normal)   Pulse 65   Resp 20   Ht 6' (1.829 m)   Wt 185 lb (83.9 kg)   SpO2 98%   BMI 25.09 kg/m  Wt Readings from Last 3 Encounters:  10/02/21 185 lb (83.9 kg)  06/22/21 197 lb 8 oz (89.6 kg)  06/19/21 202 lb 1.6 oz (91.7 kg)    Diabetic Foot Exam - Simple   No data filed    Lab Results  Component Value Date   WBC 7.4 09/27/2021   HGB 13.7 09/27/2021   HCT 43.1 09/27/2021   PLT 175.0 09/27/2021   GLUCOSE 133 (H) 10/02/2021   CHOL 161 09/27/2021   TRIG 90.0 09/27/2021   HDL 41.30 09/27/2021   LDLDIRECT 65.0 11/03/2018   LDLCALC 102 (H) 09/27/2021   ALT 13 10/02/2021   AST 19 10/02/2021   NA 138 10/02/2021   K 3.7 10/02/2021   CL 100 10/02/2021   CREATININE 1.78 (H) 10/02/2021   BUN 43 (H) 10/02/2021   CO2 25 10/02/2021   TSH 3.74 09/27/2021   PSA 1.18 10/07/2012   INR 1.0 08/15/2016   HGBA1C 6.7 (H) 09/27/2021   MICROALBUR 26.7 (H) 07/15/2016    Lab Results  Component Value Date   TSH 3.74 09/27/2021   Lab Results  Component Value Date   WBC 7.4 09/27/2021   HGB 13.7 09/27/2021   HCT 43.1 09/27/2021   MCV 85.0 09/27/2021   PLT 175.0 09/27/2021   Lab Results  Component Value Date  NA 138 10/02/2021   K 3.7 10/02/2021   CO2 25 10/02/2021   GLUCOSE 133 (H) 10/02/2021   BUN 43 (H) 10/02/2021   CREATININE 1.78 (H) 10/02/2021   BILITOT 0.9 10/02/2021   ALKPHOS 77 10/02/2021   AST 19 10/02/2021   ALT 13 10/02/2021   PROT 7.3 10/02/2021   ALBUMIN 3.8 10/02/2021   CALCIUM 9.9 10/02/2021   ANIONGAP 10 05/03/2021   EGFR 31 (L) 05/24/2021   GFR 34.19 (L) 10/02/2021   Lab Results  Component Value Date   CHOL 161 09/27/2021   Lab Results  Component Value Date   HDL 41.30 09/27/2021   Lab Results  Component Value Date   LDLCALC 102 (H) 09/27/2021   Lab  Results  Component Value Date   TRIG 90.0 09/27/2021   Lab Results  Component Value Date   CHOLHDL 4 09/27/2021   Lab Results  Component Value Date   HGBA1C 6.7 (H) 09/27/2021       Assessment & Plan:      Problem List Items Addressed This Visit     Diabetes mellitus with chronic kidney disease (HCC) - Primary (Chronic)    hgba1c acceptable, minimize simple carbs. Increase exercise as tolerated. Continue current meds      Relevant Orders   Brain natriuretic peptide (Completed)   Comprehensive metabolic panel (Completed)   Hyperlipidemia LDL goal <70 (Chronic)    Encourage heart healthy diet such as MIND or DASH diet, increase exercise, avoid trans fats, simple carbohydrates and processed foods, consider a krill or fish or flaxseed oil cap daily.       Relevant Medications   furosemide (LASIX) 40 MG tablet   Osteopenia    Encouraged to get adequate exercise, calcium and vitamin d intake      Essential hypertension    Well controlled, no changes to meds. Encouraged heart healthy diet such as the DASH diet and exercise as tolerated.       Relevant Medications   furosemide (LASIX) 40 MG tablet   Vitamin D deficiency    Supplement and monitor      Chronic diastolic CHF (congestive heart failure) (HCC)    His BNP was up with labs his lasix was increased to 80 mg daily for 5 days and will now return to 40 mg daily as he felt excessively tired and worn out on the higher dose and he appears clinically euvolemic.       Relevant Medications   furosemide (LASIX) 40 MG tablet   Morbid obesity (HCC)    Encouraged DASH or MIND diet, decrease po intake and increase exercise as tolerated. Needs 7-8 hours of sleep nightly. Avoid trans fats, eat small, frequent meals every 4-5 hours with lean proteins, complex carbs and healthy fats. Minimize simple carbs, high fat foods and processed foods      Other Visit Diagnoses     Elevated brain natriuretic peptide (BNP) level        Relevant Orders   Brain natriuretic peptide (Completed)   Comprehensive metabolic panel (Completed)       I have changed Charlis C. Trudel "Bob"'s furosemide. I am also having him maintain his glucose blood, Zinc, Krill Oil, multivitamin with minerals, acetaminophen, Eliquis, tamsulosin, empagliflozin, sitaGLIPtin, and glipiZIDE.  Meds ordered this encounter  Medications   furosemide (LASIX) 40 MG tablet    Sig: Take 0.5-1 tablets (20-40 mg total) by mouth daily. TAKE 40MG  TWICE DAILY FOR 5 DAYS, THEN 40MG  ONCE DAILY  Dispense:  90 tablet    Refill:  1

## 2021-10-02 ENCOUNTER — Ambulatory Visit (INDEPENDENT_AMBULATORY_CARE_PROVIDER_SITE_OTHER): Payer: PPO | Admitting: Family Medicine

## 2021-10-02 ENCOUNTER — Encounter: Payer: Self-pay | Admitting: Family Medicine

## 2021-10-02 VITALS — BP 124/88 | HR 65 | Resp 20 | Ht 72.0 in | Wt 185.0 lb

## 2021-10-02 DIAGNOSIS — E0822 Diabetes mellitus due to underlying condition with diabetic chronic kidney disease: Secondary | ICD-10-CM

## 2021-10-02 DIAGNOSIS — E785 Hyperlipidemia, unspecified: Secondary | ICD-10-CM | POA: Diagnosis not present

## 2021-10-02 DIAGNOSIS — I5032 Chronic diastolic (congestive) heart failure: Secondary | ICD-10-CM

## 2021-10-02 DIAGNOSIS — R7989 Other specified abnormal findings of blood chemistry: Secondary | ICD-10-CM | POA: Diagnosis not present

## 2021-10-02 DIAGNOSIS — M85851 Other specified disorders of bone density and structure, right thigh: Secondary | ICD-10-CM | POA: Diagnosis not present

## 2021-10-02 DIAGNOSIS — N183 Chronic kidney disease, stage 3 unspecified: Secondary | ICD-10-CM | POA: Diagnosis not present

## 2021-10-02 DIAGNOSIS — E1122 Type 2 diabetes mellitus with diabetic chronic kidney disease: Secondary | ICD-10-CM

## 2021-10-02 DIAGNOSIS — E559 Vitamin D deficiency, unspecified: Secondary | ICD-10-CM

## 2021-10-02 DIAGNOSIS — M85852 Other specified disorders of bone density and structure, left thigh: Secondary | ICD-10-CM

## 2021-10-02 DIAGNOSIS — I10 Essential (primary) hypertension: Secondary | ICD-10-CM

## 2021-10-02 LAB — COMPREHENSIVE METABOLIC PANEL
ALT: 13 U/L (ref 0–53)
AST: 19 U/L (ref 0–37)
Albumin: 3.8 g/dL (ref 3.5–5.2)
Alkaline Phosphatase: 77 U/L (ref 39–117)
BUN: 43 mg/dL — ABNORMAL HIGH (ref 6–23)
CO2: 25 mEq/L (ref 19–32)
Calcium: 9.9 mg/dL (ref 8.4–10.5)
Chloride: 100 mEq/L (ref 96–112)
Creatinine, Ser: 1.78 mg/dL — ABNORMAL HIGH (ref 0.40–1.50)
GFR: 34.19 mL/min — ABNORMAL LOW (ref >60.00)
Glucose, Bld: 133 mg/dL — ABNORMAL HIGH (ref 70–99)
Potassium: 3.7 mEq/L (ref 3.5–5.1)
Sodium: 138 mEq/L (ref 135–145)
Total Bilirubin: 0.9 mg/dL (ref 0.2–1.2)
Total Protein: 7.3 g/dL (ref 6.0–8.3)

## 2021-10-02 LAB — BRAIN NATRIURETIC PEPTIDE: Pro B Natriuretic peptide (BNP): 778 pg/mL — ABNORMAL HIGH (ref 0.0–100.0)

## 2021-10-02 MED ORDER — FUROSEMIDE 40 MG PO TABS: 20.0000 mg | ORAL_TABLET | Freq: Every day | ORAL | 1 refills | Status: DC

## 2021-10-02 NOTE — Patient Instructions (Signed)
Heart Failure, Diagnosis  Heart failure is a condition in which the heart has trouble pumping blood. This may mean that the heart cannot pump enough blood out to the body or that the heart does not fill up with enough blood. For some people with heart failure, fluid may back up into the lungs. There may also be swelling (edema) in the lower legs. Heart failure is usually a long-term (chronic) condition. It is important for you to take good care of yourself and follow the treatment plan from your health care provider. Different stages of heart failure have different treatment plans. The stages are: Stage A: At risk for heart failure. Having no symptoms of heart failure, but being at risk for developing heart failure. Stage B: Pre-heart failure. Having no symptoms of heart failure, but having structural changes to the heart that indicate heart failure. Stage C: Symptomatic heart failure. Having symptoms of heart failure in addition to structural changes to the heart that indicate heart failure. Stage D: Advanced heart failure. Having symptoms that interfere with daily life and frequent hospitalizations related to heart failure. What are the causes? This condition may be caused by: High blood pressure (hypertension). Hypertension causes the heart muscle to work harder than normal. Coronary artery disease, or CAD. CAD is the buildup of cholesterol and fat (plaque) in the arteries of the heart. Heart attack, also called myocardial infarction. This injures the heart muscle, making it hard for the heart to pump blood. Abnormal heart valves. The valves do not open and close properly, forcing the heart to pump harder to keep the blood flowing. Heart muscle disease, inflammation, or infection (cardiomyopathy or myocarditis). This is damage to the heart muscle. It can increase the risk of heart failure. Lung disease. The heart works harder when the lungs are not healthy. What increases the risk? The risk  of heart failure increases as a person ages. This condition is also more likely to develop in people who: Are obese. Use tobacco or nicotine products. Abuse alcohol or drugs. Have taken medicines that can damage the heart, such as chemotherapy drugs. Have any of these conditions: Diabetes. Abnormal heart rhythms. Thyroid problems. Low blood counts (anemia). Chronic kidney disease. Have a family history of heart failure. What are the signs or symptoms? Symptoms of this condition include: Shortness of breath with activity, such as when climbing stairs. A cough that does not go away. Swelling of the feet, ankles, legs, or abdomen. Losing or gaining weight for no reason. Trouble breathing when lying flat. Waking from sleep because of the need to sit up and get more air. Rapid heartbeat. Other symptoms may include: Tiredness (fatigue) and loss of energy. Feeling light-headed, dizzy, or close to fainting. Nausea or loss of appetite. Waking up more often during the night to urinate (nocturia). Confusion. How is this diagnosed? This condition is diagnosed based on: Your medical history, symptoms, and a physical exam. Blood tests. Diagnostic tests, which may include: Echocardiogram. Electrocardiogram (ECG). Chest X-ray. Exercise stress test. Cardiac MRI. Cardiac catheterization and angiogram. Radionuclide scans. How is this treated? Treatment for this condition is aimed at managing the symptoms of heart failure. Medicines Treatment may include medicines that: Help lower blood pressure by relaxing (dilating) the blood vessels. These medicines are called ACE inhibitors (angiotensin-converting enzyme), ARBs (angiotensin receptor blockers), or vasodilators. Cause the kidneys to remove salt and water from the blood through urination (diuretics). Improve heart muscle strength and prevent the heart from beating too fast (beta blockers). Increase the   force of the heartbeat  (digoxin). Lower heart rates. Certain diabetes medicines (SGLT-2 inhibitors) may also be used in treatment. Healthy behavior changes Treatment may also include making healthy lifestyle changes, such as: Reaching and staying at a healthy weight. Not using tobacco or nicotine products. Eating heart-healthy foods. Limiting or avoiding alcohol. Stopping the use of illegal drugs. Being physically active. Participating in a cardiac rehabilitation program, which is a treatment program to improve your health and well-being through exercise training, education, and counseling. Other treatments Other treatments may include: Procedures to open blocked arteries or repair damaged valves. Placing a pacemaker to improve heart function (cardiac resynchronization therapy). Placing a device to treat serious abnormal heart rhythms (implantable cardioverter defibrillator, or ICD). Placing a device to improve the pumping ability of the heart (left ventricular assist device, or LVAD). Receiving a healthy heart from a donor (heart transplant). This is done when other treatments have not helped. Follow these instructions at home: Manage other health conditions as told by your health care provider. These may include hypertension, diabetes, thyroid disease, or abnormal heart rhythms. Get ongoing education and support as needed. Learn as much as you can about heart failure. Keep all follow-up visits. This is important. Where to find more information American Heart Association: www.heart.org Centers for Disease Control and Prevention: http://www.wolf.info/ NIH National Institute on Aging: http://kim-miller.com/ Summary Heart failure is a condition in which the heart has trouble pumping blood. This condition is commonly caused by high blood pressure and other diseases of the heart and lungs. Symptoms of this condition include shortness of breath, tiredness (fatigue), nausea, and swelling of the feet, ankles, legs, or  abdomen. Treatments for this condition may include medicines, lifestyle changes, and surgery. Manage other health conditions as told by your health care provider. This information is not intended to replace advice given to you by your health care provider. Make sure you discuss any questions you have with your health care provider. Document Revised: 08/31/2020 Document Reviewed: 09/25/2019 Elsevier Patient Education  [place].

## 2021-10-03 NOTE — Assessment & Plan Note (Signed)
Encouraged DASH or MIND diet, decrease po intake and increase exercise as tolerated. Needs 7-8 hours of sleep nightly. Avoid trans fats, eat small, frequent meals every 4-5 hours with lean proteins, complex carbs and healthy fats. Minimize simple carbs, high fat foods and processed foods 

## 2021-10-03 NOTE — Assessment & Plan Note (Signed)
Encourage heart healthy diet such as MIND or DASH diet, increase exercise, avoid trans fats, simple carbohydrates and processed foods, consider a krill or fish or flaxseed oil cap daily.  °

## 2021-10-03 NOTE — Assessment & Plan Note (Signed)
Well controlled, no changes to meds. Encouraged heart healthy diet such as the DASH diet and exercise as tolerated.  °

## 2021-10-03 NOTE — Assessment & Plan Note (Signed)
hgba1c acceptable, minimize simple carbs. Increase exercise as tolerated. Continue current meds 

## 2021-10-03 NOTE — Assessment & Plan Note (Signed)
His BNP was up with labs his lasix was increased to 80 mg daily for 5 days and will now return to 40 mg daily as he felt excessively tired and worn out on the higher dose and he appears clinically euvolemic.

## 2021-10-03 NOTE — Assessment & Plan Note (Signed)
Encouraged to get adequate exercise, calcium and vitamin d intake 

## 2021-10-03 NOTE — Assessment & Plan Note (Signed)
Supplement and monitor 

## 2021-10-04 ENCOUNTER — Ambulatory Visit (INDEPENDENT_AMBULATORY_CARE_PROVIDER_SITE_OTHER): Payer: PPO | Admitting: Internal Medicine

## 2021-10-04 ENCOUNTER — Encounter: Payer: Self-pay | Admitting: Internal Medicine

## 2021-10-04 VITALS — BP 126/84 | HR 66 | Ht 72.0 in | Wt 186.2 lb

## 2021-10-04 DIAGNOSIS — I5032 Chronic diastolic (congestive) heart failure: Secondary | ICD-10-CM

## 2021-10-04 DIAGNOSIS — I4821 Permanent atrial fibrillation: Secondary | ICD-10-CM | POA: Diagnosis not present

## 2021-10-04 DIAGNOSIS — Z950 Presence of cardiac pacemaker: Secondary | ICD-10-CM | POA: Diagnosis not present

## 2021-10-04 NOTE — Patient Instructions (Signed)
Medication Instructions:  Your physician recommends that you continue on your current medications as directed. Please refer to the Current Medication list given to you today.  *If you need a refill on your cardiac medications before your next appointment, please call your pharmacy*   Lab Work: None ordered.  If you have labs (blood work) drawn today and your tests are completely normal, you will receive your results only by: [place] (if you have MyChart) OR A paper copy in the mail If you have any lab test that is abnormal or we need to change your treatment, we will call you to review the results.   Testing/Procedures: None ordered.    Follow-Up: At [facility], you and your health needs are our priority.  As part of our continuing mission to provide you with exceptional heart care, we have created designated Provider Care Teams.  These Care Teams include your primary Cardiologist (physician) and Advanced Practice Providers (APPs -  Physician Assistants and Nurse Practitioners) who all work together to provide you with the care you need, when you need it.  We recommend signing up for the patient portal called "MyChart".  Sign up information is provided on this After Visit Summary.  MyChart is used to connect with patients for Virtual Visits (Telemedicine).  Patients are able to view lab/test results, encounter notes, upcoming appointments, etc.  Non-urgent messages can be sent to your provider as well.   To learn more about what you can do with MyChart, go to [email or web address].    Your next appointment:   9 months with Dr Olin Pia, PA  Important Information About Sugar

## 2021-10-04 NOTE — Progress Notes (Signed)
Patient Care Team: [name], MD as PCP - General (Family Medicine) Debara Pickett Nadean Corwin, MD as PCP - Cardiology (Cardiology) Deboraha Sprang, MD as PCP - Electrophysiology (Cardiology) Clent Jacks, MD as Consulting Physician (Ophthalmology) Danella Sensing, MD as Consulting Physician (Dermatology) Dorothy Spark, MD as Consulting Physician (Cardiology) [name], MD as Consulting Physician (Orthopedic Surgery) Regal, Tamala Fothergill, DPM as Consulting Physician (Podiatry) Cherre Robins, RPH-CPP (Pharmacist)   HPI  Vincent Allen is a 88 y.o. male Seen in follow-up for bradycardia with atrial fibrillation.  Dual-chamber Medtronic pacemaker 7/18. The hope had been that improving his heart rate would attenuate symptoms following pacing and we could leave him in atrial fibrillation.  With progressive left ventricular dysfunction and heart failure and 100% ventricular pacing, he underwent cardiac resynchronization 4/23\  Permanent atrial fibrillation; anticoagulated with apixaban  no bleeding  Feels considerably better following CRT implantation with more energy less dyspnea.  No edema.    DATE TEST EF   4/18 Echo  55%   2/23 Echo   40-45 %              Date Cr K Hgb  9/21   15.2  2/23 2.19<<1.9 3.8 9.1>>11.4   7/23 1.78        Thromboembolic risk factors ( age - -2, HTN-1, DM-1, Vasc disease -1) for a CHADSVASc Score of 5;  He is on reduced dose apixoban   Past Medical History:  Diagnosis Date   Arthritis    Bradycardia    Cataract    Chronic kidney disease stage III (GFR 30-59 ml/min) 05/03/2012   Colon polyps    Complication of anesthesia    emesis   Diabetes mellitus    GERD (gastroesophageal reflux disease)    HOH (hard of hearing)    HTN (hypertension) 01/13/2013   Hyperlipidemia    Hypertension    Pacemaker 12/2016   Persistent atrial fibrillation (HCC)    Presence of permanent cardiac pacemaker 09/20/2016   Symptomatic bradycardia     Vitamin D deficiency 07/17/2015    Past Surgical History:  Procedure Laterality Date   ANKLE ARTHROPLASTY     BIV PACEMAKER INSERTION CRT-P N/A 06/22/2021   Procedure: UPGRADE TO BIV PACEMAKER INSERTION CRT-P;  Surgeon: Deboraha Sprang, MD;  Location: [facility];  Service: Cardiovascular;  Laterality: N/A;   CARDIOVERSION N/A 08/20/2016   Procedure: CARDIOVERSION;  Surgeon: Sanda Antavion Bartoszek, MD;  Location: [facility];  Service: Cardiovascular;  Laterality: N/A;   CATARACT EXTRACTION  2010, 2014   CATARACT EXTRACTION  02/28/14   CHOLECYSTECTOMY     CHOLECYSTECTOMY N/A 05/03/2012   Procedure: LAPAROSCOPIC CHOLECYSTECTOMY WITH INTRAOPERATIVE CHOLANGIOGRAM;  Surgeon: [name], MD;  Location: [facility];  Service: General;  Laterality: N/A;   INSERT / REPLACE / REMOVE PACEMAKER  09/20/2016   PACEMAKER IMPLANT N/A 09/20/2016   Procedure: Pacemaker Implant;  Surgeon: Deboraha Sprang, MD;  Location: [facility];  Service: Cardiovascular;  Laterality: N/A;   ROTATOR CUFF REPAIR     SHOULDER SURGERY     TONSILLECTOMY      Current Outpatient Medications  Medication Sig Dispense Refill   acetaminophen (TYLENOL) 500 MG tablet Take 500 mg by mouth every 6 (six) hours as needed for moderate pain.     ELIQUIS 2.5 MG TABS tablet TAKE 1 TABLET BY MOUTH TWICE A DAY 60 tablet 5   empagliflozin (JARDIANCE) 10 MG TABS tablet Take  1 tablet (10 mg total) by mouth daily. 90 tablet 1   furosemide (LASIX) 40 MG tablet Take 0.5-1 tablets (20-40 mg total) by mouth daily. TAKE '40MG'$  TWICE DAILY FOR 5 DAYS, THEN '40MG'$  ONCE DAILY 90 tablet 1   glipiZIDE (GLUCOTROL) 5 MG tablet Take 1 tablet (5 mg total) by mouth daily before breakfast. 90 tablet 1   glucose blood (ONE TOUCH ULTRA TEST) test strip USE 1 STRIP 2 TIMES DAILY TO CHECK BLOOD SUGAR DX E08.22, N18.3 300 each 1   Krill Oil 1000 MG CAPS Take 1,000 mg by mouth daily.     Multiple Vitamin (MULTIVITAMIN WITH MINERALS) TABS tablet Take 1 tablet by mouth  daily.     sitaGLIPtin (JANUVIA) 25 MG tablet Take 1 tablet (25 mg total) by mouth daily. 30 tablet 2   tamsulosin (FLOMAX) 0.4 MG CAPS capsule Take 1 capsule (0.4 mg total) by mouth at bedtime. 90 capsule 3   Zinc 50 MG TABS Take 50 mg by mouth daily.     No current facility-administered medications for this visit.    Allergies  Allergen Reactions   Atorvastatin     Myalgias / leg pain and weakness   Penicillins Hives and Itching    Has patient had a PCN reaction causing immediate rash, facial/tongue/throat swelling, SOB or lightheadedness with hypotension:Yes Has patient had a PCN reaction causing severe rash involving mucus membranes or skin necrosis:No Has patient had a PCN reaction that required hospitalization:No Has patient had a PCN reaction occurring within the last 10 years:No If all of the above answers are "NO", then may proceed with Cephalosporin use.    Verapamil     Junctional rhythm   Influenza Vaccines Rash    tinnitus      Review of Systems negative except from HPI and PMH  Physical Exam BP 126/84   Pulse 66   Ht 6' (1.829 m)   Wt 186 lb 3.2 oz (84.5 kg)   SpO2 96%   BMI 25.25 kg/m  Well developed and well nourished in no acute distress HENT normal Neck supple with JVP-flat Clear Device pocket well healed; without hematoma or erythema.  There is no tethering  Regular rate and rhythm, no  murmur Abd-soft with active BS No Clubbing cyanosis  edema Skin-warm and dry A & Oriented  Grossly normal sensory and motor function  ECG atrial fibrillation with biventricular pacing and upright QRS lead V1 and a negative QRS lead I   Assessment and  Plan  Atrial fibrillation-persistent slow ventricular response  Dyspnea on exertion   First-degree AV block  Congestive heart failure systolic/diastolic chronic class IIb  Pacemaker-CRT upgrade-Medtronic  Renal dysfunction grade 4 Estimated Creatinine Clearance: 32.7 mL/min (A) (by C-G formula based on  SCr of 1.78 mg/dL (H)).    The patient is significantly improved following CRT upgrade.  In about 2 or 3 months he will need a repeat echocardiogram to assess left ventricular function.  We will have him follow-up with Dr. Debara Pickett.  I will also reach out to Dr. Debara Pickett as to whether we should resume the spironolactone which I would be inclined to do and with his diabetes consider also the use of an ARB/ACE    Current medicines are reviewed at length with the patient today .  The patient does not  have concerns regarding medicines.

## 2021-10-08 NOTE — Progress Notes (Signed)
Remote pacemaker transmission.   

## 2021-10-09 NOTE — Telephone Encounter (Signed)
Vincent Allen. They state that they did a soft credit pull and pateint's income was $40,000 which means 3% out of pocket spend needed would be $1200 instead of about $700 which would be 3% of income patient reported of $20,797. Per [employer] representative they need socail security income verification form faxed to them. I tried to contact patient's son to discuss but no answer and unable to LM.  Also need LIS denial letter to send to [facility] for [place]. Per [employer] Januvia was mailed 09/17/2021 and delivered 09/21/2021 for 90 day supply. Tracking number [id number]; RX# [id number]. Approved 09/15/2021 thru 03/17/2022. Next RF can be requested around 11/18/2021. Called patient and discussed needed information for medication assistance program applications. He states his son will need to assist. He will have Vincent Allen call me.

## 2021-10-10 NOTE — Telephone Encounter (Signed)
Patient's son called earlier today but I was unable to speak to him due to other appointments.  I tried to call patient's son back regarding needs for patient assistance program applications but no answer and unable to LM.

## 2021-10-11 NOTE — Telephone Encounter (Signed)
Tried to call patient's son again to discuss needed information for patient assistance program applications for Jardiance and Eliquis. No answer and unable to LM.

## 2021-10-12 ENCOUNTER — Ambulatory Visit (INDEPENDENT_AMBULATORY_CARE_PROVIDER_SITE_OTHER): Payer: PPO | Admitting: Pharmacist

## 2021-10-12 DIAGNOSIS — E0822 Diabetes mellitus due to underlying condition with diabetic chronic kidney disease: Secondary | ICD-10-CM

## 2021-10-12 DIAGNOSIS — N183 Chronic kidney disease, stage 3 unspecified: Secondary | ICD-10-CM

## 2021-10-12 DIAGNOSIS — E559 Vitamin D deficiency, unspecified: Secondary | ICD-10-CM

## 2021-10-12 DIAGNOSIS — I5032 Chronic diastolic (congestive) heart failure: Secondary | ICD-10-CM

## 2021-10-12 DIAGNOSIS — I779 Disorder of arteries and arterioles, unspecified: Secondary | ICD-10-CM

## 2021-10-12 NOTE — Chronic Care Management (AMB) (Unsigned)
Chronic Care Management Pharmacy Note  10/12/2021 Name:  Vincent Allen MRN:  [id number] DOB:  08-Mar-1934  Summary:  Type 2 DM: Patient is taking glipizide 74m - 0.5 tablet = 537mdaily, Januvia 5077m 0.5 tablet = 53m3mily and Jardiance 10mg13mly. Last A1c was 5.8% on 04/02/2021.   Medication Management: checked to see if patient has been approved for patient assistance program for Jardiance - [facility] patient must apply for LIS. Patient's son states patient received denial letter for LIS. Reminded patient's son to bring in or mail LIS denial letter to office so [facility].  Patient assistance program for Januvia - still pending. Patient has [facility] they have not received signed attestation forms yet (they need patient attest that although he has Medicare Part D, his cost for Januvia is too high and the he is in the coverage gap) LM for patient's son to remember to complete this paperwork.   Patient assistance program for Eliquis - needed updated out of pocket spend for 2023. When last checked patient was about $11 aware from required out of pocket spend of 3% of income. [facility] and patient has spent >$700. I have to request [facility] refax report of current 2023 out of pocket spend. Patient has spent $743 so far. F[place]ed to [facility] program for review.   Follow up: PCP appointment 10/02/2021; Cardio appointment 09/25/2021; Clinical Pharmacist Practitioner will check back in 1 month regarding blood glucose  and patient assistance.    Subjective: [name] 88 y.88 year old male who is a primary patient of BlythMosie Allen  The CCM team was consulted for assistance with disease management and care coordination needs.    Engaged with patient and son by telephone for follow up visit in response to provider referral for pharmacy case management  and/or care coordination services.   Consent to Services:  The patient was given information about Chronic Care Management services, agreed to services, and gave verbal consent prior to initiation of services.  Please see initial visit note for detailed documentation.   Patient Care Team: BlythMosie Lukesas PCP - General (Family Medicine) HiltyDebara PicketteNadean Corwinas PCP - Cardiology (Cardiology) KleinDeboraha Sprangas PCP - Electrophysiology (Cardiology) GroatClent Jacksas Consulting Physician (Ophthalmology) JonesDanella Sensingas Consulting Physician (Dermatology) NelsoDorothy Sparkas Consulting Physician (Cardiology) WhitfGarald Baldingas Consulting Physician (Orthopedic Surgery) Regal, NormaTamala Fothergill as Consulting Physician (Podiatry) EckarCherre Robins-CPP (Pharmacist)  Recent Office Visits:  07/11/2021 - Fam Med (Dr BlythCharlett Blakene Call. Son reports weight loss from 198 to 187 over the last 3 weeks. Eating habits the same. Nutrition consult offered. Suggested add 2 meal supplements of Boost or Endure daily. Appt in 4 weeks with PCP. 06/19/2021 - Fam Med (Dr BlythCharlett Blakeventative health visit and F/U chronic conditions. Metoprolol 53mg 13mpotassium was removed from patient's med list during appointment but not reason stated.  05/14/2021 - Fam Med (Beck,Olevia BowensSeen for hospital follow up. No med changes noted. Checked CBC and CMP - Scr was up a little at 2.19 and eGFR was 26.75. 03/23/2021 - Fam Med (Dr Vincent)Charlett Blakediabetes; patient experience 2 hypoglycemia events recently. Lowered dose of glipizide to 5mg tw17m a day. Labs checked - showed worsening renal function. Stopped triamterene/HCTZ. rechecked renal  funciton in 1 week. Referral to nephrology.  Recent Consults:  09/11/2021 - Podiatry (Dr Milinda Pointer) follow-up of capsulitis dorsal aspect of left foot. Resolving gouty capsulitis. No med changes noted. 07/13/2021 - Podiatry - Diabetic foot care.  07/04/2021 - Cardio Rollene Rotunda, RN) CHF  clinic - wound check post pacemaker placement.  05/28/2021 - Cardio Sarajane Jews, [facility]) follow up. No med changes noted.  05/24/2021 - Cardio (Dr Caryl Comes) F/U CHF and afib with pacemaker. Discussed CRTP implantation. Patient to hold ELiquis prior to procedure. Discontinued metoprolol (for now) due to low BP. 05/11/2021 - Cardio (Transition of Care Team - [facility], [facility]) Seen for CHF hospital follow up. No medication changes noted.  05/10/2021 - Cardio (Dr Debara Pickett) F/U afib and CHF. Started tamsulosin 0.57m nightly for BPH. Labs checked.  04/20/2021 - [facility] no notes available. Patient sent to hospital due to suspected CHF exacerbation.  [facility]: [facility] Dr KCaryl Comes   04/20/2021 to [facility]  Medications stated during hospitalization: Jardiance 126mdaily, potsssium 20 mEq -  2 tablets daily and spironolactone 12.43m49maily. Medications stopped during hospitalization: silodosin 4mg33mapaflo), amlodipine 43mg,21mlcium carbonate, finasterine 43mg, 71mmaterene-HCTZ 75-50mg, 93mmin D Medications changed during hospitalization: metoprolol decreased from 243mg tw743ma day to 243mg onc76mday. Furosemide increased form 20 to 40mg daiy69m80mg daily23mObjective:  Lab Results  Component Value Date   CREATININE 1.78 (H) 10/02/2021   CREATININE 1.82 (H) 09/27/2021   CREATININE 2.06 (H) 05/24/2021    Lab Results  Component Value Date   HGBA1C 6.7 (H) 09/27/2021   Last diabetic Eye exam:  Lab Results  Component Value Date/Time   HMDIABEYEEXA No Retinopathy 08/08/2021 12:00 AM    Last diabetic Foot exam: No results found for: "HMDIABFOOTEX"      Component Value Date/Time   CHOL 161 09/27/2021 0912   TRIG 90.0 09/27/2021 0912   HDL 41.30 09/27/2021 0912   CHOLHDL 4 09/27/2021 0912   VLDL 18.0 09/27/2021 0912   LDLCALC 102 (H) 09/27/2021 0912   LDLCALC  101 (H) 12/16/2019 1543   LDLDIRECT 65.0 11/03/2018 1038       Latest Ref Rng & Units 10/02/2021   12:09 PM 09/27/2021    9:12 AM 04/20/2021    6:10 PM  Hepatic Function  Total Protein 6.0 - 8.3 g/dL 7.3  6.8  6.5   Albumin 3.5 - 5.2 g/dL 3.8  3.5  2.6   AST 0 - 37 U/L '19  17  23   ' ALT 0 - 53 U/L '13  13  17   ' Alk Phosphatase 39 - 117 U/L 77  75  65   Total Bilirubin 0.2 - 1.2 mg/dL 0.9  0.9  1.4   Bilirubin, Direct 0.0 - 0.2 mg/dL   0.5     Lab Results  Component Value Date/Time   TSH 3.74 09/27/2021 09:12 AM   TSH 4.58 04/02/2021 02:56 PM   FREET4 1.01 08/11/2020 04:25 PM       Latest Ref Rng & Units 09/27/2021    9:12 AM 05/24/2021    2:56 PM 05/14/2021   12:11 PM  CBC  WBC 4.0 - 10.5 K/uL 7.4  6.3  5.6   Hemoglobin 13.0 - 17.0 g/dL 13.7  11.8  11.4   Hematocrit 39.0 - 52.0 % 43.1  36.4  35.7   Platelets 150.0 - 400.0 K/uL 175.0  142  183.0     Lab Results  Component Value Date/Time   VD25OH 76.09 08/10/2020 03:13 PM   VD25OH 74.75 04/20/2020 11:06 AM    Clinical ASCVD: Yes  The ASCVD Risk score (Arnett DK, et al., 2019) failed to calculate for the following reasons:   The 2019 ASCVD risk score is only valid for ages 15 to 83     Social History   Tobacco Use  Smoking Status Never  Smokeless Tobacco Never   BP Readings from Last 3 Encounters:  10/04/21 126/84  10/02/21 124/88  06/22/21 127/68   Pulse Readings from Last 3 Encounters:  10/04/21 66  10/02/21 65  06/22/21 60   Wt Readings from Last 3 Encounters:  10/04/21 186 lb 3.2 oz (84.5 kg)  10/02/21 185 lb (83.9 kg)  06/22/21 197 lb 8 oz (89.6 kg)    Assessment: Review of patient past medical history, allergies, medications, health status, including review of consultants reports, laboratory and other test data, was performed as part of comprehensive evaluation and provision of chronic care management services.   SDOH:  (Social Determinants of Health) assessments and interventions performed:  SDOH  Interventions    Flowsheet Row Most Recent Value  SDOH Interventions   Stress Interventions Intervention Not Indicated          CCM Care Plan  Allergies  Allergen Reactions   Atorvastatin     Myalgias / leg pain and weakness   Penicillins Hives and Itching    Has patient had a PCN reaction causing immediate rash, facial/tongue/throat swelling, SOB or lightheadedness with hypotension:Yes Has patient had a PCN reaction causing severe rash involving mucus membranes or skin necrosis:No Has patient had a PCN reaction that required hospitalization:No Has patient had a PCN reaction occurring within the last 10 years:No If all of the above answers are "NO", then may proceed with Cephalosporin use.    Verapamil     Junctional rhythm   Influenza Vaccines Rash    tinnitus    Medications Reviewed Today     Reviewed by Cherre Robins, RPH-CPP (Pharmacist) on 10/12/21 at [place] List Status: <None>   Medication Order Taking? Sig Documenting Provider Last Dose Status Informant  acetaminophen (TYLENOL) 500 MG tablet [id number] Yes Take 500 mg by mouth every 6 (six) hours as needed for moderate pain. [provider] Taking Active Child  ELIQUIS 2.5 MG TABS tablet [id number] Yes TAKE 1 TABLET BY MOUTH TWICE A DAY [name], MD Taking Active Child  empagliflozin (JARDIANCE) 10 MG TABS tablet [id number] Yes Take 1 tablet (10 mg total) by mouth daily. [name], MD Taking Active   furosemide (LASIX) 40 MG tablet [id number] Yes Take 0.5-1 tablets (20-40 mg total) by mouth daily. TAKE 40MG TWICE DAILY FOR 5 DAYS, THEN 40MG ONCE DAILY [name], MD Taking Active   glipiZIDE (GLUCOTROL) 5 MG tablet [id number] Yes Take 1 tablet (5 mg total) by mouth daily before breakfast. [name], MD Taking Active            Med Note [facility] [place] B   Fri Sep 07, 2021 10:19 AM) Has 42m glipizide current taking 0.5 tablet daily  glucose blood (ONE TOUCH ULTRA TEST) test strip  [id number] USE 1 STRIP 2 TIMES DAILY TO CHECK BLOOD SUGAR DX E08.22, N18.3 BMosie Lukes MD Taking Active Child  Krill Oil 1000 MG CAPS [id number] Take 1,000 mg by mouth daily. [provider] Taking Active Child  Multiple Vitamin (MULTIVITAMIN WITH MINERALS) TABS tablet [id number] Yes Take 1 tablet by mouth daily. [provider] Taking Active Child  sitaGLIPtin (JANUVIA) 25 MG tablet [id number] Yes Take 1 tablet (25 mg total) by mouth daily. [name], MD Taking Active            Med Note [facility], [place] B   Fri Oct 12, 2021  9:15 AM) Taking in the afternoon; Getting from [employer] medication assistance program   tamsulosin [facility]) 0.4 MG CAPS capsule [id number] Yes Take 1 capsule (0.4 mg total) by mouth at bedtime. Pixie Casino, MD Taking Active Child           Med Note [facility]   Tue May 29, 2021 10:36 AM)    Zinc 50 MG TABS [id number] Yes Take 50 mg by mouth daily. [provider] Taking Active Child            Patient Active Problem List   Diagnosis Date Noted   Permanent atrial fibrillation ([place]) 05/24/2021   Malnutrition of moderate degree 04/27/2021   Contusion 04/02/2021   Unilateral primary osteoarthritis, right knee 02/01/2021   Fatty liver 12/01/2020   Pedal edema 08/14/2020   Knee pain, bilateral 09/20/2019   Morbid obesity ([place]) 09/14/2019   Urinary frequency 11/06/2016   S/P placement of cardiac pacemaker 10/24/2016   Chronic diastolic CHF (congestive heart failure) ([place]) 10/24/2016   Cardiac device in situ    Debility 08/15/2016   Vitamin D deficiency 07/17/2015   Preventative health care 07/17/2015   Lumbago 12/02/2014   Pain of toe of left foot 03/14/2014   Essential hypertension 01/13/2013   Insomnia 06/27/2011   Osteopenia 09/02/2010   Family history of colon cancer 09/02/2010   Diabetes mellitus with chronic kidney disease ([place])    Hyperlipidemia LDL goal <70    CTS (carpal tunnel syndrome) 08/28/2010     Immunization History  Administered Date(s) Administered   Fluad Quad(high Dose 65+) 01/16/2021   Pneumococcal Conjugate-13 03/23/2015   Pneumococcal Polysaccharide-23 06/27/2011   Tdap 05/19/2014    Conditions to be addressed/monitored: Atrial Fibrillation, CHF, CAD, HTN, HLD, DMII and osteopenia; CKD - stage 3b  Care Plan : General Pharmacy (Adult)  Updates made by Cherre Robins, RPH-CPP since 10/12/2021 12:00 AM     Problem: Provide education, support and care coordination for medication therapy and chronic conditions   Priority: High  Onset Date: 07/13/2020  Note:   CARE PLAN ENTRY   Current Barriers:  Decreasing renal function - evaluate for need to adjust medications Not checking blood pressure or weight daily - goal met  Needs assistance with cost of medications - reaches coverage gap   Hypertension / High Blood pressure  BP goal <140/90 - Controlled Current regimen:  Metoprolol succinate 74m daily  Previous meds tried: triamterene-hydrochlorothiazide (stopped due to increase in Scr), amlodipine - stopped due to edema Checks blood pressure at home: SBP 100 to 120 DBP 55-65 HR 60's Interventions:  Continue current hypertension therapy Continue to check blood pressure 2 to 3 times per week and record. Reviewed blood pressure goal  Chronic Heart Failure (HFrEF) / chronic venous insufficiency:  Recent exacerbation requiring hospitalization for 2 weeks;  Goal: prevention of CHF exacerbations and minimize symptoms of CHF. Current Regimen:  Furosemide 477m- take 1 or 2 tablets daily Metoprolol succinate ER 2566mnce a day  Jardiance 30m16mily   Last EF = 40-45% Previous medication tried: spironolactone - stopped due ot Scr increase and  elevated potassium Patient is weighing every day now. Today's weight was 181.3 lbs (down about 5 lbs over the last 10 days) Denies shortness of breath; little edema  Wearing compression hose Potassium was discontinued  05/10/2021 - Last serum potassium was 3.7 (05/24/2021). Patient complains of some weakness and twitching in him legs. He is worried that potassium could be low.  Interventions:  Discussed signs and symptoms of CHF exacerbation - weight gain, SOB, abdominal fullness, swelling in legs or abdomen, Fatigue and weakness, changes in ability to perform usual activities, persistent cough or wheezing with white or pink blood-tinged mucus, nausea and lack of appetite Continue to weigh daily - report weight gain of more than 3 lbs in 24 hours or 5 lbs in 1 week. Followed up patient assistance program applications; provided additional information needed. Awaiting on family to bring in LIS denial letter that [facility] is requiring.    Hyperlipidemia  LDL goal < 70 - not currently at goal (last LDL was 86)  Tried atorvastatin in past - caused leg pain and weakness Current regimen:  Diet and exercise management   Krill Oil 1074m daily Interventions: (addressed at previous visit)  Consider retrial of alternative statin like  rosuvastatin 575mdaily - patient declined in past Recommend limiting intake of food high in saturated fat / avoid fried foods; increase intake of vegetables Continue Krill Oil  Diabetes Lab Results  Component Value Date   HGBA1C 5.8 04/02/2021  A1c goal < 7.0%; A1c is at goal Current regimen:  Januvia 2563mnce a day (taking 0.5 tablet of 67m22mardiance 10mg33mly (though at current Scr might not be getting much blood glucose lowering from Jardiance) Glipizide 10mg 32mke 0.5 tablets = 5mg da3m Checks BG daily: usually is 90 to 110 Did have blood glucose in hospital of 77 and 76 on 06/22/2021 Approved for [employer] patient assistance program 09/15/2021 thru 03/17/2022. See above regarding Jardiance medication assistance program  Interventions: Continue lower dose of Januvia to 25mg da62m- patient's son endorsed that they received 1st shipment from medication assistance program  09/21/2021 Continue to check blood glucose / sugar 1 to 2 times per day. Notify PCP office if experience blood glucose < 80 Consider less aggressive A1c goal of <7.5% given patient's age and CVD history.  Reviewed home blood glucose readings and reviewed goals  Fasting blood glucose goal (before meals) = 80 to 130 Blood glucose goal after a meal = less than 180  Reviewed how to treat hypoglycemia  Atrial fibrillation  Current regimen:  Eliquis 2.5mg twic16maily in morning and with dinner Metoprolol succinate 25mg once27may Patient has reached coverage gap in 2023 and has spent 3% of income (based on patient reported income this would be about $700) out of pocket. However [employer] denied his application for patient assistance program stating that they performed a soft income check and income was higher than reported on application and that he would needed to spend $1200 out of pocket to qualify for medication assistance program. However they stated this income check is not always correct. They are requesting patient to provide copy of monthly Social Security income.  Interventions:   Maintain current atrial fibrillation medication regimen applied for patient assistance program for Eliquis. Additional information needed - copy of monthly Social Security income. Patient's son,  David is aShanon Browand will bring to office.   Decreased Kidney Function:  Lab Results  Component Value Date   NA 143  05/24/2021   K 3.7 05/24/2021   CO2 23 05/24/2021   GLUCOSE 140 (H) 05/24/2021   BUN 45 (H) 05/24/2021   CREATININE 2.06 (H) 05/24/2021   CALCIUM 9.0 05/24/2021   EGFR 31 (L) 05/24/2021   GFRNONAA 41 (L) 05/03/2021  Current regimen:  Jardiance 68m daily Interventions: Reviewed medications to see if any need adjustment based on current renal function.   Continue with plan to follow up with nephrologist Avoid over the counter pain relievers like Aleve / naproxen; Motrin /  ibuprofen. If need medication for pain relief, headaches or fever - Tylenol / acetaminophen 5037mup to 2 tablets every 8 hours is safer option.   Osteopenia:  Last DEXA 12/2019 Lowest T-Score = -2.4 at left femur neck ;  Right femur neck = -1.4 History of fracture of foot and shoulder. Per patient sustained from fall from standing position     06/19/2021   11:43 AM 01/01/2021    1:15 PM 07/13/2020   11:38 AM 10/06/2019    1:53 PM 09/14/2019    9:46 AM  Fall Risk   Falls in the past year? 0 0 0 0 0  Number falls in past yr: 0 0 0 0   Injury with Fall? 0 0 0 0   Risk for fall due to : Impaired balance/gait;Impaired mobility No Fall Risks History of fall(s);Impaired balance/gait    Follow up Falls evaluation completed Falls prevention discussed Falls prevention discussed;Falls evaluation completed Education provided;Falls prevention discussed   Interventions: (addressed at previous visit) Continue to take calcium + D daily Discussed fall prevention Continue to use cane for ambulation assistance Consider recheck DEXA in 2023  BPH / nocturia:  Not controlled Patient previously took Rapaflo (not effective) and finasteride (patient unsure why stopped) Current therapy:  Tamsulosin 0.72m85maily Interventions:  Continue tamsulosin Continue with plan to follow up with nephrologist and urologist   Medication management Pharmacist Clinical Goal(s): Over the next 90 days, patient will work with PharmD and providers to achieve optimal medication adherence [facility] Comprehensive medication review performed. Continue current medication management strategy Assisted patient in applying for LIS at last visit; confirmed with his son today, 09/07/2021 that patient was denied LIS / Extra Help. He will bring letter by office as [employer] [place] for Jardiance patient assistance program is requiring proof of LIS denial. Bring in copy of monthly social security payment for  Eliquis medication assistance program.  Monitor for signs and symptoms of heart failure / fluid retention - weight gain, SOB, abdominal fullness, swelling in legs or abdomen, Fatigue and weakness, changes in ability to perform usual activities, persistent cough or wheezing with white or pink blood-tinged mucus, nausea and lack of appetite Continue to weigh daily - report weight gain of more than 3 lbs in 24 hours or 5 lbs in 1 week.  Please see past updates related to this goal by clicking on the "Past Updates" button in the selected goal       Medication Assistance: approved for patient assistance for Januvia in June 2022 through 03/17/2021.  Left application for Jardiance and Januvia at front desk for patient to sign when in office 05/14/2021.  Patient did not meet 3% out of pocket spend for Eliquis patient assistance program in 2022 but will follow in 2023.    [id number] [place] [facility] [address] [place]  [place] Phone: [phone] Fax: [phone]    Follow Up:  Patient agrees to Care Plan and Follow-up.    Telephone follow up appointment with care management team member scheduled for:  4 to 6 weeks  Cherre Robins, PharmD Clinical Pharmacist [facility]

## 2021-10-12 NOTE — Patient Instructions (Signed)
  It was a pleasure speaking with you  Below is a summary of your health goals and care plan   Patient Goals:  Continue current medications  Bring letter about Medicare / Social Security extra help by office for [employer] patient assistance program Bring in copy of monthly social security payment for Eliquis medication assistance program.  Monitor for signs and symptoms of heart failure / fluid retention - weight gain, SOB, abdominal fullness, swelling in legs or abdomen, Fatigue and weakness, changes in ability to perform usual activities, persistent cough or wheezing with white or pink blood-tinged mucus, nausea and lack of appetite Continue to weigh daily - report weight gain of more than 3 lbs in 24 hours or 5 lbs in 1 week.  If you have any questions or concerns, please feel free to contact me either at the phone number below or with a MyChart message.   Keep up the good work!  Cherre Robins, PharmD Clinical Pharmacist [place] [phone] (direct line)  [phone] (main office number)   The patient verbalized understanding of instructions, educational materials, and care plan provided today and DECLINED offer to receive copy of patient instructions, educational materials, and care plan.

## 2021-10-15 DIAGNOSIS — N183 Chronic kidney disease, stage 3 unspecified: Secondary | ICD-10-CM

## 2021-10-15 DIAGNOSIS — I5032 Chronic diastolic (congestive) heart failure: Secondary | ICD-10-CM

## 2021-10-15 DIAGNOSIS — E0822 Diabetes mellitus due to underlying condition with diabetic chronic kidney disease: Secondary | ICD-10-CM

## 2021-10-15 NOTE — Telephone Encounter (Signed)
See notes from appointment 10/12/2021

## 2021-10-19 ENCOUNTER — Encounter: Payer: Self-pay | Admitting: Podiatry

## 2021-10-19 ENCOUNTER — Ambulatory Visit: Payer: PPO | Admitting: Podiatry

## 2021-10-19 DIAGNOSIS — N183 Chronic kidney disease, stage 3 unspecified: Secondary | ICD-10-CM | POA: Diagnosis not present

## 2021-10-19 DIAGNOSIS — E0822 Diabetes mellitus due to underlying condition with diabetic chronic kidney disease: Secondary | ICD-10-CM | POA: Diagnosis not present

## 2021-10-19 DIAGNOSIS — M79675 Pain in left toe(s): Secondary | ICD-10-CM

## 2021-10-19 DIAGNOSIS — B351 Tinea unguium: Secondary | ICD-10-CM

## 2021-10-19 DIAGNOSIS — M79674 Pain in right toe(s): Secondary | ICD-10-CM | POA: Diagnosis not present

## 2021-10-25 ENCOUNTER — Ambulatory Visit: Payer: PPO | Admitting: Internal Medicine

## 2021-10-25 ENCOUNTER — Encounter: Payer: Self-pay | Admitting: Internal Medicine

## 2021-10-25 VITALS — BP 141/82 | HR 75 | Ht 72.0 in | Wt 188.0 lb

## 2021-10-25 DIAGNOSIS — I4819 Other persistent atrial fibrillation: Secondary | ICD-10-CM | POA: Diagnosis not present

## 2021-10-25 DIAGNOSIS — Z950 Presence of cardiac pacemaker: Secondary | ICD-10-CM | POA: Diagnosis not present

## 2021-10-25 DIAGNOSIS — I5022 Chronic systolic (congestive) heart failure: Secondary | ICD-10-CM | POA: Diagnosis not present

## 2021-10-25 DIAGNOSIS — E785 Hyperlipidemia, unspecified: Secondary | ICD-10-CM | POA: Diagnosis not present

## 2021-10-25 DIAGNOSIS — I420 Dilated cardiomyopathy: Secondary | ICD-10-CM

## 2021-10-25 MED ORDER — EZETIMIBE 10 MG PO TABS: 10.0000 mg | ORAL_TABLET | Freq: Every day | ORAL | 3 refills | Status: DC

## 2021-10-25 MED ORDER — LOSARTAN POTASSIUM 25 MG PO TABS: 25.0000 mg | ORAL_TABLET | Freq: Every day | ORAL | 3 refills | Status: DC

## 2021-10-25 NOTE — Patient Instructions (Addendum)
Medication Instructions:  START ZETIA 10 mg, take 1 tablet daily START LOSARTAN 25 mg, take 1 tablet daily  *If you need a refill on your cardiac medications before your next appointment, please call your pharmacy*   Lab Work: FASTING CMET and Lipid Panel in 3 months If you have labs (blood work) drawn today and your tests are completely normal, you will receive your results only by: [place] (if you have MyChart) OR A paper copy in the mail If you have any lab test that is abnormal or we need to change your treatment, we will call you to review the results.   Testing/Procedures: Your physician has requested that you have an echocardiogram. Echocardiography is a painless test that uses sound waves to create images of your heart. It provides your doctor with information about the size and shape of your heart and how well your heart's chambers and valves are working. This procedure takes approximately one hour. There are no restrictions for this procedure.    Follow-Up: At [facility], you and your health needs are our priority.  As part of our continuing mission to provide you with exceptional heart care, we have created designated Provider Care Teams.  These Care Teams include your primary Cardiologist (physician) and Advanced Practice Providers (APPs -  Physician Assistants and Nurse Practitioners) who all work together to provide you with the care you need, when you need it.  We recommend signing up for the patient portal called "MyChart".  Sign up information is provided on this After Visit Summary.  MyChart is used to connect with patients for Virtual Visits (Telemedicine).  Patients are able to view lab/test results, encounter notes, upcoming appointments, etc.  Non-urgent messages can be sent to your provider as well.   To learn more about what you can do with MyChart, go to [email or web address].    Your next appointment:   Follow up in 3 or 4 months with Dr. Debara Pickett    Important Information About Sugar

## 2021-10-25 NOTE — Progress Notes (Signed)
OFFICE FOLLOW-UP NOTE  Chief Complaint:  Follow-up CHF  Primary Care Physician: [name], MD  HPI:  Vincent Allen is a 88 y.o. male with a past medial history significant for atrial fibrillation/flutter with slow ventricular response. Seen recently in the hospital after mechanical fall in the home. He has a history of paroxysmal atrial fibrillation in the past and was seen by Dr. Wynonia Lawman that was not on any anticoagulation. His CHADSVASC score is 4. He was started on Eliquis 2.5 mg twice a day due to age greater than 80 creatinine greater than 1.5. An echocardiogram was performed which showed normal systolic function, diastolic dysfunction moderate left atrial enlargement. I arranged for outpatient monitoring which shows persistent atrial fibrillation/flutter with controlled ventricular response and at times bradycardia but no clear long pauses or any other indications for pacemaker at this time. He reports being fatigued. He's been an active problem for more than 60 years and is interested in getting back to [place] feels that his lack of energy is keeping him back from that.  09/12/2016  Vincent Allen returns for follow-up today. He underwent recent a cardioversion by Dr. Sallyanne Kuster. This was successful and he reported feeling a little better with heart rates in sinus in the 50s for about a week, however then started to feel more fatigued. He's not been on any AV nodal blocking medications. He reports compliance with Eliquis. EKG today shows atrial fibrillation with a marked bradycardic response of 40. Notes that his heart rate will not necessarily increase with any exertion.  10/24/2016  Vincent Allen returns today for follow-up. He underwent successful pacemaker placement by Dr. Caryl Comes about 3 weeks ago. This was a Medtronic dual-chamber pacemaker. Subsequent pacemaker follow-up shows 100% atrial fibrillation. Pacemaker appears to be working properly. The wound appears to be well-healed. He is restricted his  arm movement now and is interested in more activity. He recently had lab work in advance to upcoming visit with Dr. Randel Pigg. Total cholesterol is 110, Travis was 167, HDL 37 and LDL 39. Metabolic profile was unremarkable with potassium however was slightly low at 3.4 and creatinine 1.45. Blood pressure is elevated today. I reviewed home readings indicating his blood pressure ranges from the about the 353I to 144R systolic over 15Q diastolic. Heart rate is been mostly in the 70s and 80s. There is no evidence of rapid atrial fibrillation or bradycardia. He reports improvement in his energy level.  04/28/2017  Vincent Allen returns today for follow-up.  He has had significant improvement in his symptoms after placement of pacemaker.  He still gets a little short of breath but is not been quite as active.  Weight is somewhat better now to 19 and was as high as 225 in November.  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  Blood pressure is well controlled today.  Has had follow-up with Dr. Caryl Comes who feels the pacemaker is working appropriately.  05/11/2018  Vincent Allen is seen today in annual follow-up.  Overall he is doing well.  He is not been working on his airplane is much recently.  He says that he is worried about falls.  He did sell off a lot of his projects as well as his angina recently.  He denies any chest pain or shortness of breath.  He said no syncopal episodes.  Recent pacemaker interrogation shows a normal pacing and he is ventricular paced today.  He has persistent if not permanent A. fib.  He is not  have any bleeding problems on Eliquis.  Blood pressures been well controlled.  Lab work recently and April 23, 2018 showed total cholesterol 171, HDL 34, triglycerides of 182 and LDL of 100.  His goal LDL should be slightly lower than that I would suspect if we want to be aggressive probably less than 100.  He is not listed to be on statin therapy but previously was on atorvastatin 20  mg.  He is not sure if he is taking it but again I had him listed on that a year ago.  I would advise if he is come off the medication to restart that otherwise we may need to consider increasing it further.  05/09/2021  Vincent Allen is seen today in follow-up.  He was recently hospitalized and I cared for him for about a week of that hospitalization that actually went more than 2 weeks.  He had symptoms of right heart failure but also had some LV dysfunction with EF around 45%.  He underwent paracentesis with about 5 L of fluid removed.  This was transudative and look more like heart failure.  Subsequently was placed on milrinone and had good color oximetry panels.  He diuresed from 244 pounds down to 201 pounds today which is his new her dry weight.  On discharge his Lasix was increased to 40 mg twice daily.  He is currently taking that in the morning.  He continues to have issues with nocturia and has had BPH for years but is not on treatment for that.  I suspect this is the reason for his nocturnal overflow incontinence.  Blood pressure is good today.  He says he feels well and is ambulating without difficulty.  Oxygen saturation has improved.  He will need repeat labs including metabolic profile and magnesium because of issues with hypokalemia during the hospitalization.  He has a transition of care follow-up with heart failure on February 24.  It was also noted that he had 100% ventricular pacing on recent remote checks.  This could be an etiology of his reduced LV function.  He was seen by Dr. Sallyanne Kuster, who recommended evaluation for CRT-P therapy.  I would then refer Mr. Hopes to our electrophysiologist.  10/25/2021  Vincent Allen returns today for follow-up.  He had been referred to Dr. Caryl Comes for evaluation of possible biventricular pacemaker upgrade.  He did qualify for that and had placement of that earlier this year.  Since then he has had significant improvement in his heart failure symptoms.  Now NYHA class II.   He has been seen by Dr. Caryl Comes recently and his primary care provider who increased his diuretic.  He 1 point he was up to 80 mg of Lasix but noted being dizzy on this dose.  He is currently on Lasix 40 mg daily.  He has appeared euvolemic.  Blood pressure is elevated somewhat today.  He is only on Jardiance for heart failure medications.  He does have stage III-IV chronic kidney disease.  GFR around 32.  Recent lipids in July showed total cholesterol 161, HDL 41, triglycerides 90 and LDL 102.  PMHx:  Past Medical History:  Diagnosis Date   Arthritis    Bradycardia    Cataract    Chronic kidney disease stage III (GFR 30-59 ml/min) 05/03/2012   Colon polyps    Complication of anesthesia    emesis   Diabetes mellitus    GERD (gastroesophageal reflux disease)    HOH (hard of hearing)    HTN (hypertension)  01/13/2013   Hyperlipidemia    Hypertension    Pacemaker 12/2016   Persistent atrial fibrillation (HCC)    Presence of permanent cardiac pacemaker 09/20/2016   Symptomatic bradycardia    Vitamin D deficiency 07/17/2015    Past Surgical History:  Procedure Laterality Date   ANKLE ARTHROPLASTY     BIV PACEMAKER INSERTION CRT-P N/A 06/22/2021   Procedure: UPGRADE TO BIV PACEMAKER INSERTION CRT-P;  Surgeon: Deboraha Sprang, MD;  Location: [facility];  Service: Cardiovascular;  Laterality: N/A;   CARDIOVERSION N/A 08/20/2016   Procedure: CARDIOVERSION;  Surgeon: Sanda Klein, MD;  Location: [facility];  Service: Cardiovascular;  Laterality: N/A;   CATARACT EXTRACTION  2010, 2014   CATARACT EXTRACTION  02/28/14   CHOLECYSTECTOMY     CHOLECYSTECTOMY N/A 05/03/2012   Procedure: LAPAROSCOPIC CHOLECYSTECTOMY WITH INTRAOPERATIVE CHOLANGIOGRAM;  Surgeon: [name], MD;  Location: [facility];  Service: General;  Laterality: N/A;   INSERT / REPLACE / REMOVE PACEMAKER  09/20/2016   PACEMAKER IMPLANT N/A 09/20/2016   Procedure: Pacemaker Implant;  Surgeon: Deboraha Sprang, MD;  Location: [facility];  Service: Cardiovascular;  Laterality: N/A;   ROTATOR CUFF REPAIR     SHOULDER SURGERY     TONSILLECTOMY      FAMHx:  Family History  Problem Relation Age of Onset   Cancer Mother        colon, breast, pancreas, skin cancer   Heart disease Father        heart valve replaced   Stroke Father    Diabetes Father    Cancer Paternal Grandmother        colon   Obesity Son    Heart disease Son        bradycardia    SOCHx:   reports that he has never smoked. He has never used smokeless tobacco. He reports current alcohol use of about 1.0 standard drink of alcohol per week. He reports that he does not use drugs.  ALLERGIES:  Allergies  Allergen Reactions   Atorvastatin     Myalgias / leg pain and weakness   Penicillins Hives and Itching    Has patient had a PCN reaction causing immediate rash, facial/tongue/throat swelling, SOB or lightheadedness with hypotension:Yes Has patient had a PCN reaction causing severe rash involving mucus membranes or skin necrosis:No Has patient had a PCN reaction that required hospitalization:No Has patient had a PCN reaction occurring within the last 10 years:No If all of the above answers are "NO", then may proceed with Cephalosporin use.    Verapamil     Junctional rhythm   Influenza Vaccines Rash    tinnitus    ROS: Pertinent items noted in HPI and remainder of comprehensive ROS otherwise negative.  HOME MEDS: Current Outpatient Medications on File Prior to Visit  Medication Sig Dispense Refill   acetaminophen (TYLENOL) 500 MG tablet Take 500 mg by mouth every 6 (six) hours as needed for moderate pain.     ELIQUIS 2.5 MG TABS tablet TAKE 1 TABLET BY MOUTH TWICE A DAY 60 tablet 5   empagliflozin (JARDIANCE) 10 MG TABS tablet Take 1 tablet (10 mg total) by mouth daily. 90 tablet 1   furosemide (LASIX) 40 MG tablet Take 0.5-1 tablets (20-40 mg total) by mouth daily. TAKE '40MG'$  TWICE DAILY FOR 5 DAYS, THEN '40MG'$  ONCE DAILY 90  tablet 1   glipiZIDE (GLUCOTROL) 5 MG tablet Take 1 tablet (5 mg total) by mouth daily before breakfast.  90 tablet 1   glucose blood (ONE TOUCH ULTRA TEST) test strip USE 1 STRIP 2 TIMES DAILY TO CHECK BLOOD SUGAR DX E08.22, N18.3 300 each 1   Krill Oil 1000 MG CAPS Take 1,000 mg by mouth daily.     Multiple Vitamin (MULTIVITAMIN WITH MINERALS) TABS tablet Take 1 tablet by mouth daily.     sitaGLIPtin (JANUVIA) 25 MG tablet Take 1 tablet (25 mg total) by mouth daily. 30 tablet 2   tamsulosin (FLOMAX) 0.4 MG CAPS capsule Take 1 capsule (0.4 mg total) by mouth at bedtime. 90 capsule 3   Zinc 50 MG TABS Take 50 mg by mouth daily.     No current facility-administered medications on file prior to visit.    LABS/IMAGING: No results found for this or any previous visit (from the past 48 hour(s)). No results found.  LIPID PANEL:    Component Value Date/Time   CHOL 161 09/27/2021 0912   TRIG 90.0 09/27/2021 0912   HDL 41.30 09/27/2021 0912   CHOLHDL 4 09/27/2021 0912   VLDL 18.0 09/27/2021 0912   LDLCALC 102 (H) 09/27/2021 0912   LDLCALC 101 (H) 12/16/2019 1543   LDLDIRECT 65.0 11/03/2018 1038     WEIGHTS: Wt Readings from Last 3 Encounters:  10/25/21 188 lb (85.3 kg)  10/04/21 186 lb 3.2 oz (84.5 kg)  10/02/21 185 lb (83.9 kg)    VITALS: BP (!) 141/82   Pulse 75   Ht 6' (1.829 m)   Wt 188 lb (85.3 kg)   SpO2 97%   BMI 25.50 kg/m   EXAM: General appearance: alert, no distress, and pale Neck: no carotid bruit, no JVD, and thyroid not enlarged, symmetric, no tenderness/mass/nodules Lungs: clear to auscultation bilaterally Heart: regular rate and rhythm, S1, S2 normal, no murmur, click, rub or gallop and Healing pacemaker site left upper chest Abdomen: soft, non-tender; bowel sounds normal; no masses,  no organomegaly and mildly protuberant Extremities: edema 1+ bilateral LE Pulses: 2+ and symmetric Skin: Skin color, texture, turgor normal. 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   Mr. Borquez seems to be doing quite a bit better after recent upgrade to a Medtronic CRT-P device in April 2023.  He appears euvolemic on exam.  He is more physically active.  His energy level has improved.  Will continue his current dose of medications.  He is only on Jardiance for his heart failure.  With his chronic kidney disease I am hesitant to add Aldactone.  I do think we could possibly retrial a low-dose losartan as blood pressure is elevated somewhat.  Start 25 mg daily.  In addition his lipids are not well-controlled.  He has been intolerant to statins before and should have LDL cholesterol less than 70.  Would advise starting ezetimibe 10 mg daily.  Will plan a repeat metabolic profile and a lipid profile in about 3 months and will have a repeat echo at that time to see if there is been any improvement in LV function.  Follow-up with me afterwards.  Pixie Casino, MD, [facility], [place]  [facility] of the [facility] Lipid [facility]  Cardiovascular [facility] Reduction [facility] Diplomate of the American Board of Clinical Lipidology Attending Cardiologist  Direct Dial: [phone]  Fax: [phone]  Website:  [email or web address] 10/25/2021, 10:04 AM

## 2021-10-28 NOTE — Progress Notes (Signed)
  Subjective:  Patient ID: Vincent Allen, male    DOB: 1933/04/28,  MRN: [id number]  Vincent Allen presents to clinic today for at risk foot care. Pt has h/o NIDDM with chronic kidney disease and painful elongated mycotic toenails 1-5 bilaterally which are tender when wearing enclosed shoe gear. Pain is relieved with periodic professional debridement.  Patient states blood glucose was 107 mg/dl today.  Last A1c was 6.2%.  New problem(s): None.   PCP is [name], MD , and last visit was  October 02, 2021  Allergies  Allergen Reactions   Atorvastatin     Myalgias / leg pain and weakness   Penicillins Hives and Itching    Has patient had a PCN reaction causing immediate rash, facial/tongue/throat swelling, SOB or lightheadedness with hypotension:Yes Has patient had a PCN reaction causing severe rash involving mucus membranes or skin necrosis:No Has patient had a PCN reaction that required hospitalization:No Has patient had a PCN reaction occurring within the last 10 years:No If all of the above answers are "NO", then may proceed with Cephalosporin use.    Verapamil     Junctional rhythm   Influenza Vaccines Rash    tinnitus    Review of Systems: Negative except as noted in the HPI.  Objective: No changes noted in today's physical examination. Vincent Allen is a pleasant 88 y.o. male in NAD. AAO X 3.  Vascular Examination: Capillary refill time to digits immediate b/l. Palpable DP pulse(s) b/l LE. Faintly palpable PT pulse(s) b/l lower extremities. Pedal hair sparse. No pain with calf compression RLE. Lower extremity skin temperature gradient within normal limits. Trace edema noted BLE. Evidence of chronic venous insufficiency b/l LE. No cyanosis or clubbing noted b/l LE.  Dermatological Examination: No open wounds b/l LE. No interdigital macerations noted b/l LE. Toenails 1-5 b/l elongated, discolored, dystrophic, thickened, crumbly with subungual debris and  tenderness to dorsal palpation. No hyperkeratotic lesion(s) plantar heel pad of both feet.  No erythema, no edema, no drainage, no fluctuance. Hyperpigmentation consistent with findings of chronic venous insufficiency is present b/l lower extremities.  Musculoskeletal Examination: Muscle strength 5/5 to all lower extremity muscle groups bilaterally. No pain, crepitus or joint limitation noted with ROM bilateral LE. Pes planus deformity noted bilateral LE. Utilizes cane for ambulation assistance.  Neurological Examination: Protective sensation intact 5/5 intact bilaterally with 10g monofilament b/l.     Latest Ref Rng & Units 09/27/2021    9:12 AM 04/02/2021    2:56 PM 11/30/2020    4:00 PM  Hemoglobin A1C  Hemoglobin-A1c 4.6 - 6.5 % 6.7  5.8  6.3    Assessment/Plan: 1. Pain due to onychomycosis of toenails of both feet   2. Diabetes mellitus due to underlying condition with stage 3 chronic kidney disease, unspecified whether long term insulin use, unspecified whether stage 3a or 3b CKD ([place])     -Examined patient. -No new findings. No new orders. -Continue foot and shoe inspections daily. Monitor blood glucose per PCP/Endocrinologist's recommendations. -Mycotic toenails 1-5 bilaterally were debrided in length and girth with sterile nail nippers and dremel without incident. -Patient/POA to call should there be question/concern in the interim.   Return in about 3 months (around 01/19/2022).  Marzetta Board, DPM

## 2021-10-30 ENCOUNTER — Ambulatory Visit (INDEPENDENT_AMBULATORY_CARE_PROVIDER_SITE_OTHER): Payer: PPO | Admitting: Pharmacist

## 2021-10-30 DIAGNOSIS — I5032 Chronic diastolic (congestive) heart failure: Secondary | ICD-10-CM

## 2021-10-30 DIAGNOSIS — E0822 Diabetes mellitus due to underlying condition with diabetic chronic kidney disease: Secondary | ICD-10-CM

## 2021-10-30 DIAGNOSIS — I779 Disorder of arteries and arterioles, unspecified: Secondary | ICD-10-CM

## 2021-10-30 DIAGNOSIS — E785 Hyperlipidemia, unspecified: Secondary | ICD-10-CM

## 2021-10-30 DIAGNOSIS — N183 Chronic kidney disease, stage 3 unspecified: Secondary | ICD-10-CM

## 2021-10-30 NOTE — Chronic Care Management (AMB) (Signed)
Chronic Care Management Pharmacy Note  10/30/2021 Name:  Vincent Allen MRN:  [id number] DOB:  [birth year]  Summary:  Type 2 DM: Patient is taking glipizide 32m - 0.5 tablet = 571mdaily, Januvia 5074m 0.5 tablet = 38m82mily and Jardiance 10mg87mly. Last A1c was 6.7%. Reminded son to bring in copy of LIS denial letter that [place] program is requiring for approval of Jariance medication assistance program. Hyperlipidemia: patient started ezetimibe 10mg 68my for elevated LDL. He has declined to retry statin (atorvastatin caused muscle aches). Noted to have hand tingling since starting Ezetimibe (also started losartan 38mg a37mme time). Patient instructed to hold ezetimibe 10mg fo64mto 2 weeks to see if hand numbness improved. He is to contact Dr Hilty abDebara Pickettolding and if tingling improves. If not might need to consider alternative to losartan or see provider to evaluate for other causes of tingling in hands.  Medication Management: Patient assistance program for Eliquis - Per [employer] check showed different income than that on application. They state sometimes their report is not accurate. BMS is requesting copy of patient's Social security income verification letter for 2023. Reminded patient's son to  ring into office so we can send to BMS.    Follow up: Clinical Pharmacist Practitioner follow up in 2 to 4 weeks.    Subjective: Vincent CHOA DERISO8 y.o. 55ar old male who is a primary patient of [name], SMosie Lukeshe CCM team was consulted for assistance with disease management and care coordination needs.    Engaged with patient and son and patient by telephone for follow up visit in response to provider referral for pharmacy case management and/or care coordination services.   Consent to Services:  The patient was given information about Chronic Care Management services, agreed to services, and gave  verbal consent prior to initiation of services.  Please see initial visit note for detailed documentation.   Patient Care Team: [name], SMosie LukesPCP - General (Family Medicine) Hilty, KDebara Pickett Nadean CorwinPCP - Cardiology (Cardiology) Klein, SDeboraha SprangPCP - Electrophysiology (Cardiology) Groat, RClent JacksConsulting Physician (Ophthalmology) Jones, DDanella SensingConsulting Physician (Dermatology) Nelson, Dorothy SparkConsulting Physician (Cardiology) WhitfielGarald BaldingConsulting Physician (Orthopedic Surgery) Regal, Norman STamala Fothergill Consulting Physician (Podiatry) Ammanda Dobbins, Cherre RobinsP (Pharmacist)  Recent Office Visits:  10/02/2021 - Fam Med (Dr [name]) SCharlett Blakeor follow up. Checked BNP and CMP. Recommended furosemide 40mg twi45m day for 5 days, then 40mg dail55m4/26/2023 - Fam Med (Dr [name]) PhoCharlett Blakell. Son reports weight loss from 198 to 187 over the last 3 weeks. Eating habits the same. Nutrition consult offered. Suggested add 2 meal supplements of Boost or Endure daily. Appt in 4 weeks with PCP. 06/19/2021 - Fam Med (Dr [name]) PreCharlett Blaketive health visit and F/U chronic conditions. Metoprolol 38mg and p65msium was removed from patient's med list during appointment but not reason stated.  05/14/2021 - Fam Med (Beck, NP) Olevia Bowensfor hospital follow up. No med changes noted. Checked CBC and CMP - Scr was up a little at 2.19 and eGFR was 26.75.  Recent Consults:  10/25/2021 - Cardio (Dr Hilty) SeenDebara Pickettcongestive cardiomyopathy and afib. NYHA class II. Started low dose losartan 38mg daily 66mCHF / HTN. Started ezetimibe 10mg daily w73mLDL goal of < 70. Ordered repeat ECHO. Lipids and BMP  recheck in 3 months.  10/19/2021 - Podiatrist (Dr Elisha Ponder) Seen for diabetic foot care and mycotic toenails. Nails debrided. F/U 3 months. 10/04/2021 - Cardio (Dr Caryl Comes) F/U A.fib. The patient is significantly improved following CRT upgrade.  In about 2 or 3 months repeat echocardiogram  to assess left ventricular function.  Recommended follow-up with Dr. Debara Pickett.  Dr Caryl Comes to out to Dr. Debara Pickett as to whether should resume spironolactone or possibly ARB/ACE 09/11/2021 - Podiatry (Dr Milinda Pointer) follow-up of capsulitis dorsal aspect of left foot. Resolving gouty capsulitis. No med changes noted. 07/13/2021 - Podiatry - Diabetic foot care.  07/04/2021 - Cardio Rollene Rotunda, RN) CHF clinic - wound check post pacemaker placement.  05/28/2021 - Cardio Sarajane Jews, [facility]) follow up. No med changes noted.  05/24/2021 - Cardio (Dr Caryl Comes) F/U CHF and afib with pacemaker. Discussed CRTP implantation. Patient to hold ELiquis prior to procedure. Discontinued metoprolol (for now) due to low BP. 05/11/2021 - Cardio (Transition of Care Team - SImmons, [facility]) Seen for CHF hospital follow up. No medication changes noted.    Hospital visits: 06/22/2021 [facility] for [facility] placement / upgrade by Dr Caryl Comes.   04/20/2021 to 05/03/2021 Hospitalization at [facility]  for CHF.  Medications stated during hospitalization: Jardiance 3m daily, potsssium 20 mEq -  2 tablets daily and spironolactone 12.561mdaily. Medications stopped during hospitalization: silodosin 43m21mRapaflo), amlodipine 5mg70malcium carbonate, finasterine 5mg,36mimaterene-HCTZ 75-50mg,19mamin D Medications changed during hospitalization: metoprolol decreased from 25mg t37m a day to 25mg on65m day. Furosemide increased form 20 to 40mg dai543m 80mg dail63m Objective:  Lab Results  Component Value Date   CREATININE 1.78 (H) 10/02/2021   CREATININE 1.82 (H) 09/27/2021   CREATININE 2.06 (H) 05/24/2021    Lab Results  Component Value Date   HGBA1C 6.7 (H) 09/27/2021   Last diabetic Eye exam:  Lab Results  Component Value Date/Time   HMDIABEYEEXA No Retinopathy 08/08/2021 12:00 AM    Last diabetic Foot exam: No results found for: "HMDIABFOOTEX"      Component Value Date/Time   CHOL 161 09/27/2021 0912    TRIG 90.0 09/27/2021 0912   HDL 41.30 09/27/2021 0912   CHOLHDL 4 09/27/2021 0912   VLDL 18.0 09/27/2021 0912   LDLCALC 102 (H) 09/27/2021 0912   LDLCALC 101 (H) 12/16/2019 1543   LDLDIRECT 65.0 11/03/2018 1038       Latest Ref Rng & Units 10/02/2021   12:09 PM 09/27/2021    9:12 AM 04/20/2021    6:10 PM  Hepatic Function  Total Protein 6.0 - 8.3 g/dL 7.3  6.8  6.5   Albumin 3.5 - 5.2 g/dL 3.8  3.5  2.6   AST 0 - 37 U/L _0 ALT 0 - 53 U/L _1 Alk Phosphatase 39 - 117 U/L 77  75  65   Total Bilirubin 0.2 - 1.2 mg/dL 0.9  0.9  1.4   Bilirubin, Direct 0.0 - 0.2 mg/dL   0.5     Lab Results  Component Value Date/Time   TSH 3.74 09/27/2021 09:12 AM   TSH 4.58 04/02/2021 02:56 PM   FREET4 1.01 08/11/2020 04:25 PM       Latest Ref Rng & Units 09/27/2021    9:12 AM 05/24/2021    2:56 PM 05/14/2021   12:11 PM  CBC  WBC 4.0 - 10.5 K/uL 7.4  6.3  5.6  Hemoglobin 13.0 - 17.0 g/dL 13.7  11.8  11.4   Hematocrit 39.0 - 52.0 % 43.1  36.4  35.7   Platelets 150.0 - 400.0 K/uL 175.0  142  183.0     Lab Results  Component Value Date/Time   VD25OH 76.09 08/10/2020 03:13 PM   VD25OH 74.75 04/20/2020 11:06 AM    Clinical ASCVD: Yes  The ASCVD Risk score (Arnett DK, et al., 2019) failed to calculate for the following reasons:   The 2019 ASCVD risk score is only valid for ages 103 to 64     Social History   Tobacco Use  Smoking Status Never  Smokeless Tobacco Never   BP Readings from Last 3 Encounters:  10/25/21 (!) 141/82  10/04/21 126/84  10/02/21 124/88   Pulse Readings from Last 3 Encounters:  10/25/21 75  10/04/21 66  10/02/21 65   Wt Readings from Last 3 Encounters:  10/25/21 188 lb (85.3 kg)  10/04/21 186 lb 3.2 oz (84.5 kg)  10/02/21 185 lb (83.9 kg)    Assessment: Review of patient past medical history, allergies, medications, health status, including review of consultants reports, laboratory and other test data, was performed as part of  comprehensive evaluation and provision of chronic care management services.   SDOH:  (Social Determinants of Health) assessments and interventions performed:        CCM Care Plan  Allergies  Allergen Reactions   Atorvastatin     Myalgias / leg pain and weakness   Penicillins Hives and Itching    Has patient had a PCN reaction causing immediate rash, facial/tongue/throat swelling, SOB or lightheadedness with hypotension:Yes Has patient had a PCN reaction causing severe rash involving mucus membranes or skin necrosis:No Has patient had a PCN reaction that required hospitalization:No Has patient had a PCN reaction occurring within the last 10 years:No If all of the above answers are "NO", then may proceed with Cephalosporin use.    Verapamil     Junctional rhythm   Influenza Vaccines Rash    tinnitus    Medications Reviewed Today     Reviewed by Cherre Robins, RPH-CPP (Pharmacist) on 10/30/21 at 1051  Med List Status: <None>   Medication Order Taking? Sig Documenting Provider Last Dose Status Informant  acetaminophen (TYLENOL) 500 MG tablet [id number] Yes Take 500 mg by mouth every 6 (six) hours as needed for moderate pain. [provider] Taking Active Child  ELIQUIS 2.5 MG TABS tablet [id number] Yes TAKE 1 TABLET BY MOUTH TWICE A DAY [name], MD Taking Active Child  empagliflozin (JARDIANCE) 10 MG TABS tablet [id number] Yes Take 1 tablet (10 mg total) by mouth daily. [name], MD Taking Active   ezetimibe (ZETIA) 10 MG tablet [id number] Yes Take 1 tablet (10 mg total) by mouth daily. Pixie Casino, MD Taking Active   furosemide (LASIX) 40 MG tablet [id number] Yes Take 0.5-1 tablets (20-40 mg total) by mouth daily. TAKE 40MG TWICE DAILY FOR 5 DAYS, THEN 40MG ONCE DAILY [name], MD Taking Active   glipiZIDE (GLUCOTROL) 5 MG tablet [id number] Yes Take 1 tablet (5 mg total) by mouth daily before breakfast. [name], MD Taking Active             Med Note [facility], [place] B   Fri Sep 07, 2021 10:19 AM) Has 47m glipizide current taking 0.5 tablet daily  glucose blood (ONE TOUCH ULTRA TEST) test strip [id number] USE 1 STRIP 2 TIMES DAILY TO CHECK BLOOD  SUGAR DX E08.22, N18.3 [name], MD Taking Active Child  Krill Oil 1000 MG CAPS [id number] Yes Take 1,000 mg by mouth daily. [provider] Taking Active Child  losartan (COZAAR) 25 MG tablet [id number] Yes Take 1 tablet (25 mg total) by mouth daily. Pixie Casino, MD Taking Active   Multiple Vitamin (MULTIVITAMIN WITH MINERALS) TABS tablet [id number] Yes Take 1 tablet by mouth daily. [provider] Taking Active Child  sitaGLIPtin (JANUVIA) 25 MG tablet [id number] Yes Take 1 tablet (25 mg total) by mouth daily. [name], MD Taking Active            Med Note [facility], [place] B   Fri Oct 12, 2021  9:15 AM) Taking in the afternoon; Getting from [employer] medication assistance program   tamsulosin [facility]) 0.4 MG CAPS capsule [id number] Yes Take 1 capsule (0.4 mg total) by mouth at bedtime. Pixie Casino, MD Taking Active Child           Med Note [facility]   Tue May 29, 2021 10:36 AM)    Zinc 50 MG TABS [id number] Yes Take 50 mg by mouth daily. [provider] Taking Active Child            Patient Active Problem List   Diagnosis Date Noted   Permanent atrial fibrillation ([place]) 05/24/2021   Malnutrition of moderate degree 04/27/2021   Contusion 04/02/2021   Unilateral primary osteoarthritis, right knee 02/01/2021   Fatty liver 12/01/2020   Pedal edema 08/14/2020   Knee pain, bilateral 09/20/2019   Morbid obesity ([place]) 09/14/2019   Urinary frequency 11/06/2016   S/P placement of cardiac pacemaker 10/24/2016   Chronic diastolic CHF (congestive heart failure) ([place]) 10/24/2016   Cardiac device in situ    Debility 08/15/2016   Vitamin D deficiency 07/17/2015   Preventative health care 07/17/2015   Lumbago 12/02/2014   Pain of toe of  left foot 03/14/2014   Essential hypertension 01/13/2013   Insomnia 06/27/2011   Osteopenia 09/02/2010   Family history of colon cancer 09/02/2010   Diabetes mellitus with chronic kidney disease ([place])    Hyperlipidemia LDL goal <70    CTS (carpal tunnel syndrome) 08/28/2010    Immunization History  Administered Date(s) Administered   Fluad Quad(high Dose 65+) 01/16/2021   Pneumococcal Conjugate-13 03/23/2015   Pneumococcal Polysaccharide-23 06/27/2011   Tdap 05/19/2014    Conditions to be addressed/monitored: Atrial Fibrillation, CHF, CAD, HTN, HLD, DMII and osteopenia; CKD - stage 3b  Care Plan : General Pharmacy (Adult)  Updates made by Cherre Robins, RPH-CPP since 10/30/2021 12:00 AM     Problem: Provide education, support and care coordination for medication therapy and chronic conditions   Priority: High  Onset Date: 07/13/2020  Note:   CARE PLAN ENTRY   Current Barriers:  Decreasing renal function - evaluate for need to adjust medications Not checking blood pressure or weight daily - goal met  Needs assistance with cost of medications - reaches coverage gap   Hypertension / High Blood pressure  BP goal < 130/80  Last office blood pressure was not at goal Current regimen:  Metoprolol succinate 25mg  daily  Losartan 25mg  daily Previous meds tried: triamterene-hydrochlorothiazide (stopped due to increase in Scr), amlodipine - stopped due to edema Interventions:  Continue current hypertension therapy Continue to check blood pressure 2 to 3 times per week and record. Reviewed blood pressure goal  Chronic Heart Failure (HFrEF) / chronic venous insufficiency:  Recent exacerbation requiring hospitalization  for 2 weeks;  Goal: prevention of CHF exacerbations and minimize symptoms of CHF. Current Regimen:  Furosemide 40mg  - take 1 or 2 tablets daily Metoprolol succinate ER 25mg  once a day  Jardiance 10mg  daily   Losartan 25mg  daily  Last EF = 40-45% Previous  medication tried: spironolactone - stopped due ot Scr increase and elevated potassium Patient is weighing every day now. Today's weight was 181.3 lbs (down about 5 lbs over the last 10 days) Denies shortness of breath; little edema  Wearing compression hose Potassium was discontinued 05/10/2021 - Last serum potassium was 3.7 (09/27/2021). Patient complains of some weakness and twitching in him legs. He is worried that potassium could be low.  Interventions:  Discussed signs and symptoms of CHF exacerbation - weight gain, SOB, abdominal fullness, swelling in legs or abdomen, Fatigue and weakness, changes in ability to perform usual activities, persistent cough or wheezing with white or pink blood-tinged mucus, nausea and lack of appetite Continue to weigh daily - report weight gain of more than 3 lbs in 24 hours or 5 lbs in 1 week. Followed up patient assistance program applications; provided additional information needed. Awaiting on family to bring in LIS denial letter that [facility] C[facility] is requiring. If cannot find at home, son will request copy from social security office.    Hyperlipidemia  LDL goal < 70 - not currently at goal (last LDL was 102)  Tried atorvastatin in past - caused leg pain and weakness Current regimen:  Diet and exercise management   Krill Oil 1000mg  daily Ezetimibe 10mg  daily  Interventions:  Hold ezetimibe for 1 to 2 weeks to see if numbness in hands improved. Report to Dr Debara Pickett. Recommend limiting intake of food high in saturated fat / avoid fried foods; increase intake of vegetables Continue Krill Oil  Diabetes Lab Results  Component Value Date   HGBA1C 5.8 04/02/2021  A1c goal < 7.0%; A1c is at goal Current regimen:  Januvia 25mg  once a day (taking 0.5 tablet of 50mg ) Jardiance 10mg  daily (though at current Scr might not be getting much blood glucose lowering from Jardiance) Glipizide 10mg  - take 0.5 tablets = 5mg  daily Checks BG daily: usually is 90 to  110 Approved for [employer] patient assistance program 09/15/2021 thru 03/17/2022. See above regarding Jardiance medication assistance program  Interventions: Continue lower dose of Januvia to 25mg  daily - patient's son endorsed that they received 1st shipment from medication assistance program 09/21/2021 Continue to check blood glucose / sugar 1 to 2 times per day. Notify PCP office if experience blood glucose < 80 Consider less aggressive A1c goal of <7.5% given patient's age and CVD history.  Reviewed home blood glucose readings and reviewed goals  Fasting blood glucose goal (before meals) = 80 to 130 Blood glucose goal after a meal = less than 180  Reviewed how to treat hypoglycemia  Atrial fibrillation  Current regimen:  Eliquis 2.5mg  twice daily in morning and with dinner Metoprolol succinate 25mg  once a day Patient has reached coverage gap in 2023 and has spent 3% of income (based on patient reported income this would be about $700) out of pocket. However [employer] / Eliquis denied his application for patient assistance program stating that they performed a soft income check and income was higher than reported on application and that he would needed to spend $1200 out of pocket to qualify for medication assistance program. However they stated this income check is not always correct. They are requesting patient  to provide copy of monthly Social Security income.  Interventions:   Maintain current atrial fibrillation medication regimen applied for patient assistance program for Eliquis. Additional information needed - copy of monthly Social Security income. Patient's son,  Shanon Brow is aware and will bring to office.   Decreased Kidney Function:  Current regimen:  Jardiance 20m daily Interventions: Reviewed medications to see if any need adjustment based on current renal function.   Continue with plan to follow up with nephrologist Avoid over the counter pain relievers like Aleve /  naproxen; Motrin / ibuprofen. If need medication for pain relief, headaches or fever - Tylenol / acetaminophen 5040mup to 2 tablets every 8 hours is safer option.   Osteopenia:  Last DEXA 12/2019 Lowest T-Score = -2.4 at left femur neck ;  Right femur neck = -1.4 History of fracture of foot and shoulder. Per patient sustained from fall from standing position     06/19/2021   11:43 AM 01/01/2021    1:15 PM 07/13/2020   11:38 AM 10/06/2019    1:53 PM 09/14/2019    9:46 AM  Fall Risk   Falls in the past year? 0 0 0 0 0  Number falls in past yr: 0 0 0 0   Injury with Fall? 0 0 0 0   Risk for fall due to : Impaired balance/gait;Impaired mobility No Fall Risks History of fall(s);Impaired balance/gait    Follow up Falls evaluation completed Falls prevention discussed Falls prevention discussed;Falls evaluation completed Education provided;Falls prevention discussed   Interventions: (addressed at previous visit) Continue to take calcium + D daily Discussed fall prevention Continue to use cane for ambulation assistance Consider recheck DEXA in 2023  BPH / nocturia:  Not controlled Patient previously took Rapaflo (not effective) and finasteride (patient unsure why stopped) Current therapy:  Tamsulosin 0.60m51maily Interventions:  Continue tamsulosin Continue with plan to follow up with nephrologist and urologist   Medication management Pharmacist Clinical Goal(s): Over the next 90 days, patient will work with PharmD and providers to achieve optimal medication adherence [facility] Inter[facility] Comprehensive medication review performed. Continue current medication management strategy  Patient Goals:  Continue current medications  Bring letter about Medicare / Social Security extra help by office for Jardiance patient assistance program Bring in copy of monthly social security payment for Eliquis medication assistance program.  Monitor for signs and symptoms of heart  failure / fluid retention - weight gain, SOB, abdominal fullness, swelling in legs or abdomen, Fatigue and weakness, changes in ability to perform usual activities, persistent cough or wheezing with white or pink blood-tinged mucus, nausea and lack of appetite Continue to weigh daily - report weight gain of more than 3 lbs in 24 hours or 5 lbs in 1 week. Hold ezetimibe for 1 or 2 weeks to see if numbness in hand improves.  Please see past updates related to this goal by clicking on the "Past Updates" button in the selected goal       Medication Assistance: approved for patient assistance for Januvia through 03/17/2022  Working on getting needed additional information requested by JarGhanad Eliquis medication assistance program. JarVania Reaeds copay of LIS denial letter and Eliquis needs patient Social Security income verification for determining 2023 out of pocket amount. (3% of income)    [facility]  [id number] [place] [facility] [address] [place] [id number]: [phone]:  [phone]  Follow Up:  Patient agrees to Care Plan and Follow-up.    Telephone follow up appointment with care management team member scheduled for:  2 to 4 weeks  Cherre Robins, PharmD Clinical Pharmacist [facility]

## 2021-10-30 NOTE — Patient Instructions (Signed)
Mr. Derksen It was a pleasure speaking with you today.  Below is a summary of your health goals and summary of our recent visit. You can also view your updated Chronic Care Management Care plan through your MyChart account.   Patient Goals:  Continue current medications  Bring letter about Medicare / Social Security extra help by office for [employer] patient assistance program Bring in copy of monthly social security payment for Eliquis medication assistance program.  Monitor for signs and symptoms of heart failure / fluid retention - weight gain, SOB, abdominal fullness, swelling in legs or abdomen, Fatigue and weakness, changes in ability to perform usual activities, persistent cough or wheezing with white or pink blood-tinged mucus, nausea and lack of appetite Continue to weigh daily - report weight gain of more than 3 lbs in 24 hours or 5 lbs in 1 week. Hold ezetimibe for 1 or 2 weeks to see if numbness in hand improves.   As always if you have any questions or concerns especially regarding medications, please feel free to contact me either at the phone number below or with a MyChart message.   Keep up the good work!  Cherre Robins, PharmD Clinical Pharmacist [place] [phone] (direct line)  [phone] (main office number)   Patient verbalizes understanding of instructions and care plan provided today and agrees to view in [place]. Active MyChart status and patient understanding of how to access instructions and care plan via MyChart confirmed with patient.

## 2021-11-05 ENCOUNTER — Encounter: Payer: Self-pay | Admitting: Family Medicine

## 2021-11-05 DIAGNOSIS — L821 Other seborrheic keratosis: Secondary | ICD-10-CM | POA: Diagnosis not present

## 2021-11-05 DIAGNOSIS — D485 Neoplasm of uncertain behavior of skin: Secondary | ICD-10-CM | POA: Diagnosis not present

## 2021-11-05 DIAGNOSIS — D0462 Carcinoma in situ of skin of left upper limb, including shoulder: Secondary | ICD-10-CM | POA: Diagnosis not present

## 2021-11-05 DIAGNOSIS — L570 Actinic keratosis: Secondary | ICD-10-CM | POA: Diagnosis not present

## 2021-11-05 DIAGNOSIS — L820 Inflamed seborrheic keratosis: Secondary | ICD-10-CM | POA: Diagnosis not present

## 2021-11-05 DIAGNOSIS — Z85828 Personal history of other malignant neoplasm of skin: Secondary | ICD-10-CM | POA: Diagnosis not present

## 2021-11-06 ENCOUNTER — Telehealth: Payer: Self-pay | Admitting: Pharmacist

## 2021-11-06 ENCOUNTER — Ambulatory Visit (HOSPITAL_COMMUNITY): Payer: PPO | Attending: Cardiology

## 2021-11-06 DIAGNOSIS — I420 Dilated cardiomyopathy: Secondary | ICD-10-CM

## 2021-11-06 LAB — ECHOCARDIOGRAM COMPLETE
Area-P 1/2: 2.94 cm2
S' Lateral: 4.4 cm

## 2021-11-06 NOTE — Telephone Encounter (Signed)
Patient's son is planning to bring needed documents for Eliquis and Jardiance patient assistance program tomorrow 11/07/2021. Need LIS denial letter for Jardiance patient assistance program and copy of Medicare / Social Security income for Eliquis.  Son asked if we had any samples of either Eliquis or Jardiance. I am working remotely today but will check for samples when in office tomorrow.

## 2021-11-07 NOTE — Telephone Encounter (Signed)
Son dropped off paperwork and placed in Tammy's box up front.

## 2021-11-07 NOTE — Telephone Encounter (Signed)
No Jardiance samples.  Provided #28 samples for Eliquis '5mg'$  - patient's son is aware he will take 0.5 tablet of the '5mg'$  Eliquis twice a day.

## 2021-11-08 ENCOUNTER — Telehealth: Payer: Self-pay | Admitting: Orthopaedic Surgery

## 2021-11-08 NOTE — Telephone Encounter (Signed)
VOB submitted for Orthovisc, right knee   

## 2021-11-08 NOTE — Telephone Encounter (Signed)
Please advise if these series of injections is able to be ordered for patient.

## 2021-11-08 NOTE — Telephone Encounter (Signed)
Patient called in stating he is having bad pain in his knee again and would like the 3 series injections again from Dr. Durward Fortes please advise he would like the appt ASAP patient has Health team advantage

## 2021-11-08 NOTE — Telephone Encounter (Signed)
Noted. Done.

## 2021-11-09 ENCOUNTER — Ambulatory Visit: Payer: PPO | Admitting: Pharmacist

## 2021-11-09 DIAGNOSIS — I5032 Chronic diastolic (congestive) heart failure: Secondary | ICD-10-CM

## 2021-11-09 DIAGNOSIS — N183 Chronic kidney disease, stage 3 unspecified: Secondary | ICD-10-CM

## 2021-11-09 NOTE — Chronic Care Management (AMB) (Signed)
Chronic Care Management Pharmacy Note  11/09/2021 Name:  Vincent Allen MRN:  [id number] DOB:  Apr 18, 1933  Summary:  Type 2 DM: Patient is taking glipizide 81m - 0.5 tablet = 563mdaily, Januvia 5082m 0.5 tablet = 21m39mily and Jardiance 10mg51mly. Last A1c was 6.7%. No change in medications recommended.  Hyperlipidemia: patient started ezetimibe 10mg 95my for elevated LDL. He has declined to retry statin (atorvastatin caused muscle aches).  Medication Management: Patient assistance program for Eliquis - Per [employer] credit check showed different income than that on application. They state sometimes their report is not accurate. BMS is requesting copy of patient's Social security income verification letter for 2023. Faxed copy of LIS / Extra Help denial letter which verifies patient's income. .   Also applied for patient assistance program for Jardiance. Additional information needed - Denial letter for Medicare Extra Assistance / LIS. Faxed copy of this letter to [facility] assistance program.    Follow up: Clinical Pharmacist Practitioner follow up in 2 to 4 weeks.    Subjective: Vincent Allen 88 y.o70year old male who is a primary patient of [name],[name] The CCM team was consulted for assistance with disease management and care coordination needs.    Engaged with patient and son and patient by telephone for follow up visit in response to provider referral for pharmacy case management and/or care coordination services.   Consent to Services:  The patient was given information about Chronic Care Management services, agreed to services, and gave verbal consent prior to initiation of services.  Please see initial visit note for detailed documentation.   Patient Care Team: [name],Mosie Lukess PCP - General (Family Medicine) Hilty,Debara PicketttNadean Corwins PCP - Cardiology (Cardiology) Klein,Deboraha Sprangs  PCP - Electrophysiology (Cardiology) Groat,Clent Jackss Consulting Physician (Ophthalmology) Jones,Danella Sensings Consulting Physician (Dermatology) NelsonDorothy Sparks Consulting Physician (Cardiology) WhitfiGarald Baldings Consulting Physician (Orthopedic Surgery) Regal, NormanTamala Fothergillas Consulting Physician (Podiatry) EckardCherre RobinsCPP (Pharmacist)  Recent Office Visits:  10/02/2021 - Fam Med (Dr [name])[name] for follow up. Checked BNP and CMP. Recommended furosemide 40mg t69m a day for 5 days, then 40mg da86m 07/11/2021 - Fam Med (Dr [name]) PCharlett BlakeCall. Son reports weight loss from 198 to 187 over the last 3 weeks. Eating habits the same. Nutrition consult offered. Suggested add 2 meal supplements of Boost or Endure daily. Appt in 4 weeks with PCP. 06/19/2021 - Fam Med (Dr [name]) PCharlett Blaketative health visit and F/U chronic conditions. Metoprolol 21mg and8massium was removed from patient's med list during appointment but not reason stated.  05/14/2021 - Fam Med (Beck, NPOlevia Bowensn for hospital follow up. No med changes noted. Checked CBC and CMP - Scr was up a little at 2.19 and eGFR was 26.75.  Recent Consults:  10/25/2021 - Cardio (Dr Hilty) SeDebara Pickettr congestive cardiomyopathy and afib. NYHA class II. Started low dose losartan 21mg dail50mr CHF / HTN. Started ezetimibe 10mg daily23mh LDL goal of < 70. Ordered repeat ECHO. Lipids and BMP recheck in 3 months.  10/19/2021 - Podiatrist (Dr Galaway) SeElisha Ponderdiabetic foot care and mycotic toenails. Nails debrided. F/U 3 months. 10/04/2021 - Cardio (Dr Klein) F/U Caryl Comes. The patient is significantly improved following CRT upgrade.  In about 2 or 3 months repeat echocardiogram to assess left  ventricular function.  Recommended follow-up with Dr. Debara Pickett.  Dr Caryl Comes to out to Dr. Debara Pickett as to whether should resume spironolactone or possibly ARB/ACE 09/11/2021 - Podiatry (Dr Milinda Pointer) follow-up of capsulitis dorsal aspect of left foot. Resolving  gouty capsulitis. No med changes noted. 07/13/2021 - Podiatry - Diabetic foot care.  07/04/2021 - Cardio Rollene Rotunda, RN) CHF clinic - wound check post pacemaker placement.  05/28/2021 - Cardio Sarajane Jews, [facility]) follow up. No med changes noted.  05/24/2021 - Cardio (Dr Caryl Comes) F/U CHF and afib with pacemaker. Discussed CRTP implantation. Patient to hold ELiquis prior to procedure. Discontinued metoprolol (for now) due to low BP. 05/11/2021 - Cardio (Transition of Care Team - SImmons, [facility]) Seen for CHF hospital follow up. No medication changes noted.    Hospital visits: 06/22/2021 [facility] for [facility] placement / upgrade by Dr Caryl Comes.   04/20/2021 to 05/03/2021 Hospitalization at [facility]  for CHF.  Medications stated during hospitalization: Jardiance 6m daily, potsssium 20 mEq -  2 tablets daily and spironolactone 12.576mdaily. Medications stopped during hospitalization: silodosin 60m53mRapaflo), amlodipine 5mg11malcium carbonate, finasterine 5mg,63mimaterene-HCTZ 75-50mg,62mamin D Medications changed during hospitalization: metoprolol decreased from 25mg t27m a day to 25mg on21m day. Furosemide increased form 20 to 40mg dai41m 80mg dail16m Objective:  Lab Results  Component Value Date   CREATININE 1.78 (H) 10/02/2021   CREATININE 1.82 (H) 09/27/2021   CREATININE 2.06 (H) 05/24/2021    Lab Results  Component Value Date   HGBA1C 6.7 (H) 09/27/2021   Last diabetic Eye exam:  Lab Results  Component Value Date/Time   HMDIABEYEEXA No Retinopathy 08/08/2021 12:00 AM    Last diabetic Foot exam: No results found for: "HMDIABFOOTEX"      Component Value Date/Time   CHOL 161 09/27/2021 0912   TRIG 90.0 09/27/2021 0912   HDL 41.30 09/27/2021 0912   CHOLHDL 4 09/27/2021 0912   VLDL 18.0 09/27/2021 0912   LDLCALC 102 (H) 09/27/2021 0912   LDLCALC 101 (H) 12/16/2019 1543   LDLDIRECT 65.0 11/03/2018 1038       Latest Ref Rng & Units 10/02/2021    12:09 PM 09/27/2021    9:12 AM 04/20/2021    6:10 PM  Hepatic Function  Total Protein 6.0 - 8.3 g/dL 7.3  6.8  6.5   Albumin 3.5 - 5.2 g/dL 3.8  3.5  2.6   AST 0 - 37 U/L '19  17  23   ' ALT 0 - 53 U/L '13  13  17   ' Alk Phosphatase 39 - 117 U/L 77  75  65   Total Bilirubin 0.2 - 1.2 mg/dL 0.9  0.9  1.4   Bilirubin, Direct 0.0 - 0.2 mg/dL   0.5     Lab Results  Component Value Date/Time   TSH 3.74 09/27/2021 09:12 AM   TSH 4.58 04/02/2021 02:56 PM   FREET4 1.01 08/11/2020 04:25 PM       Latest Ref Rng & Units 09/27/2021    9:12 AM 05/24/2021    2:56 PM 05/14/2021   12:11 PM  CBC  WBC 4.0 - 10.5 K/uL 7.4  6.3  5.6   Hemoglobin 13.0 - 17.0 g/dL 13.7  11.8  11.4   Hematocrit 39.0 - 52.0 % 43.1  36.4  35.7   Platelets 150.0 - 400.0 K/uL 175.0  142  183.0     Lab Results  Component Value Date/Time   VD25OH 76.09 08/10/2020 03:13  PM   VD25OH 74.75 04/20/2020 11:06 AM    Clinical ASCVD: Yes  The ASCVD Risk score (Arnett DK, et al., 2019) failed to calculate for the following reasons:   The 2019 ASCVD risk score is only valid for ages 62 to 107     Social History   Tobacco Use  Smoking Status Never  Smokeless Tobacco Never   BP Readings from Last 3 Encounters:  10/25/21 (!) 141/82  10/04/21 126/84  10/02/21 124/88   Pulse Readings from Last 3 Encounters:  10/25/21 75  10/04/21 66  10/02/21 65   Wt Readings from Last 3 Encounters:  10/25/21 188 lb (85.3 kg)  10/04/21 186 lb 3.2 oz (84.5 kg)  10/02/21 185 lb (83.9 kg)    Assessment: Review of patient past medical history, allergies, medications, health status, including review of consultants reports, laboratory and other test data, was performed as part of comprehensive evaluation and provision of chronic care management services.   SDOH:  (Social Determinants of Health) assessments and interventions performed:        CCM Care Plan  Allergies  Allergen Reactions   Atorvastatin     Myalgias / leg pain and  weakness   Penicillins Hives and Itching    Has patient had a PCN reaction causing immediate rash, facial/tongue/throat swelling, SOB or lightheadedness with hypotension:Yes Has patient had a PCN reaction causing severe rash involving mucus membranes or skin necrosis:No Has patient had a PCN reaction that required hospitalization:No Has patient had a PCN reaction occurring within the last 10 years:No If all of the above answers are "NO", then may proceed with Cephalosporin use.    Verapamil     Junctional rhythm   Influenza Vaccines Rash    tinnitus    Medications Reviewed Today     Reviewed by Cherre Robins, RPH-CPP (Pharmacist) on 11/09/21 at 0754  Med List Status: <None>   Medication Order Taking? Sig Documenting Provider Last Dose Status Informant  acetaminophen (TYLENOL) 500 MG tablet [id number] No Take 500 mg by mouth every 6 (six) hours as needed for moderate pain. [provider] Taking Active Child  ELIQUIS 2.5 MG TABS tablet [id number] No TAKE 1 TABLET BY MOUTH TWICE A DAY [name], MD Taking Active Child  empagliflozin (JARDIANCE) 10 MG TABS tablet [id number] No Take 1 tablet (10 mg total) by mouth daily. [name], MD Taking Active   ezetimibe (ZETIA) 10 MG tablet [id number] No Take 1 tablet (10 mg total) by mouth daily. Pixie Casino, MD Taking Active   furosemide (LASIX) 40 MG tablet [id number] No Take 0.5-1 tablets (20-40 mg total) by mouth daily. TAKE 40MG TWICE DAILY FOR 5 DAYS, THEN 40MG ONCE DAILY [name], MD Taking Active   glipiZIDE (GLUCOTROL) 5 MG tablet [id number] No Take 1 tablet (5 mg total) by mouth daily before breakfast. [name], MD Taking Active            Med Note [facility], [place] B   Fri Sep 07, 2021 10:19 AM) Has 32m glipizide current taking 0.5 tablet daily  glucose blood (ONE TOUCH ULTRA TEST) test strip [id number] USE 1 STRIP 2 TIMES DAILY TO CHECK BLOOD SUGAR DX E08.22, N18.3 BMosie Lukes MD Taking Active Child   Krill Oil 1000 MG CAPS [id number] Take 1,000 mg by mouth daily. [provider] Taking Active Child  losartan (COZAAR) 25 MG tablet [id number] Take 1 tablet (25 mg total) by mouth daily. HLyman Bishop  C, MD Taking Active   Multiple Vitamin (MULTIVITAMIN WITH MINERALS) TABS tablet [id number] No Take 1 tablet by mouth daily. [provider] Taking Active Child  sitaGLIPtin (JANUVIA) 25 MG tablet [id number] No Take 1 tablet (25 mg total) by mouth daily. [name], MD Taking Active            Med Note [facility], [place] B   Fri Oct 12, 2021  9:15 AM) Taking in the afternoon; Getting from [employer] medication assistance program   tamsulosin (FLOMAX) 0.4 MG CAPS capsule [id number] No Take 1 capsule (0.4 mg total) by mouth at bedtime. Pixie Casino, MD Taking Active Child           Med Note Antony Contras, [place] May 29, 2021 10:36 AM)    Zinc 50 MG TABS [id number] No Take 50 mg by mouth daily. [provider] Taking Active Child            Patient Active Problem List   Diagnosis Date Noted   Permanent atrial fibrillation ([place]) 05/24/2021   Malnutrition of moderate degree 04/27/2021   Contusion 04/02/2021   Unilateral primary osteoarthritis, right knee 02/01/2021   Fatty liver 12/01/2020   Pedal edema 08/14/2020   Knee pain, bilateral 09/20/2019   Morbid obesity ([place]) 09/14/2019   Urinary frequency 11/06/2016   S/P placement of cardiac pacemaker 10/24/2016   Chronic diastolic CHF (congestive heart failure) ([place]) 10/24/2016   Cardiac device in situ    Debility 08/15/2016   Vitamin D deficiency 07/17/2015   Preventative health care 07/17/2015   Lumbago 12/02/2014   Pain of toe of left foot 03/14/2014   Essential hypertension 01/13/2013   Insomnia 06/27/2011   Osteopenia 09/02/2010   Family history of colon cancer 09/02/2010   Diabetes mellitus with chronic kidney disease ([place])    Hyperlipidemia LDL goal <70    CTS (carpal tunnel syndrome) 08/28/2010     Immunization History  Administered Date(s) Administered   Fluad Quad(high Dose 65+) 01/16/2021   Pneumococcal Conjugate-13 03/23/2015   Pneumococcal Polysaccharide-23 06/27/2011   Tdap 05/19/2014    Conditions to be addressed/monitored: Atrial Fibrillation, CHF, CAD, HTN, HLD, DMII and osteopenia; CKD - stage 3b  There are no care plans that you recently modified to display for this patient.     Medication Assistance: approved for patient assistance for Januvia through 03/17/2022  Working on getting needed additional information requested by [place] and Eliquis medication assistance program. Vania Rea needs copay of LIS denial letter and Eliquis needs patient Social Security income verification for determining 2023 out of pocket amount. (3% of income)    Patient's preferred pharmacy is:  [id number] [place], [place] - [address] [place] [address]Phone: [phone]: [phone] Follow Up:  Patient agrees to Care Plan and Follow-up.    Telephone follow up appointment with care management team member scheduled for:  2 to 4 weeks  TCherre Robins PharmD Clinical Pharmacist [facility] SW [facility]

## 2021-11-09 NOTE — Patient Instructions (Addendum)
Vincent Allen It was a pleasure speaking with you today.  Below is a summary of your health goals and summary of our recent visit. You can also view your updated Chronic Care Management Care plan through your MyChart account.  We have submitted additional information needed for Jardaince and Eliquis medication assistance program. We should hear their decision in 2 weeks. I have a phone follow up scheduled for 11/21/2021 so we can check on this.   Patient Goals:  Continue current medications  Monitor for signs and symptoms of heart failure / fluid retention - weight gain, SOB, abdominal fullness, swelling in legs or abdomen, Fatigue and weakness, changes in ability to perform usual activities, persistent cough or wheezing with white or pink blood-tinged mucus, nausea and lack of appetite Continue to weigh daily - report weight gain of more than 3 lbs in 24 hours or 5 lbs in 1 week. Hold ezetimibe for 1 or 2 weeks to see if numbness in hand improves.   As always if you have any questions or concerns especially regarding medications, please feel free to contact me either at the phone number below or with a MyChart message.   Keep up the good work!  Cherre Robins, PharmD Clinical Pharmacist [place] [phone] (direct line)  [phone] (main office number)   Patient verbalizes understanding of instructions and care plan provided today and agrees to view in [place]. Active MyChart status and patient understanding of how to access instructions and care plan via MyChart confirmed with patient.

## 2021-11-15 DIAGNOSIS — N401 Enlarged prostate with lower urinary tract symptoms: Secondary | ICD-10-CM | POA: Diagnosis not present

## 2021-11-15 DIAGNOSIS — E785 Hyperlipidemia, unspecified: Secondary | ICD-10-CM | POA: Diagnosis not present

## 2021-11-15 DIAGNOSIS — I110 Hypertensive heart disease with heart failure: Secondary | ICD-10-CM

## 2021-11-15 DIAGNOSIS — R351 Nocturia: Secondary | ICD-10-CM | POA: Diagnosis not present

## 2021-11-15 DIAGNOSIS — I4891 Unspecified atrial fibrillation: Secondary | ICD-10-CM | POA: Diagnosis not present

## 2021-11-15 DIAGNOSIS — E1159 Type 2 diabetes mellitus with other circulatory complications: Secondary | ICD-10-CM | POA: Diagnosis not present

## 2021-11-15 DIAGNOSIS — I5030 Unspecified diastolic (congestive) heart failure: Secondary | ICD-10-CM

## 2021-11-15 DIAGNOSIS — M8580 Other specified disorders of bone density and structure, unspecified site: Secondary | ICD-10-CM

## 2021-11-16 ENCOUNTER — Telehealth: Payer: Self-pay | Admitting: Orthopaedic Surgery

## 2021-11-16 NOTE — Telephone Encounter (Signed)
Pt son Shanon Brow called about an update for gel injections submitted last week. Please call pt when approved. Pt phone number is [phone].

## 2021-11-20 ENCOUNTER — Other Ambulatory Visit: Payer: Self-pay

## 2021-11-20 DIAGNOSIS — M1711 Unilateral primary osteoarthritis, right knee: Secondary | ICD-10-CM

## 2021-11-20 NOTE — Telephone Encounter (Signed)
Tried calling son to schedule for gel injection for patient, but no answer and no VM setup to leave a message.   Talked with patient and appointments have been scheduled.

## 2021-11-21 ENCOUNTER — Ambulatory Visit: Payer: PPO | Admitting: Orthopaedic Surgery

## 2021-11-21 ENCOUNTER — Ambulatory Visit (INDEPENDENT_AMBULATORY_CARE_PROVIDER_SITE_OTHER): Payer: PPO | Admitting: Pharmacist

## 2021-11-21 DIAGNOSIS — I5032 Chronic diastolic (congestive) heart failure: Secondary | ICD-10-CM

## 2021-11-21 DIAGNOSIS — I4821 Permanent atrial fibrillation: Secondary | ICD-10-CM

## 2021-11-21 DIAGNOSIS — N183 Chronic kidney disease, stage 3 unspecified: Secondary | ICD-10-CM

## 2021-11-21 DIAGNOSIS — M1711 Unilateral primary osteoarthritis, right knee: Secondary | ICD-10-CM | POA: Diagnosis not present

## 2021-11-21 MED ORDER — HYALURONAN 30 MG/2ML IX SOSY
30.0000 mg | PREFILLED_SYRINGE | INTRA_ARTICULAR | Status: AC | PRN
  Administered 2021-11-21: 30 mg via INTRA_ARTICULAR

## 2021-11-21 NOTE — Progress Notes (Addendum)
Office Visit Note   Patient: Vincent Allen           Date of Birth: 1933/10/26           MRN: [id number] Visit Date: 11/21/2021              Requested by: [name], MD [address] [place],  [place] [address] PCP: [name], MD  Chief Complaint  Patient presents with   Right Knee - Pain    Here for OrthoVisc #1---      HPI: Vincent Allen comes in today for his first Orthovisc into his right knee.  He has no complaints.  Assessment & Plan: Visit Diagnoses:  1. Unilateral primary osteoarthritis, right knee     Plan: We will go forward with the first Orthovisc injection into his right knee today.  We will follow-up next week for his second.  In 2 weeks he will come in and get an Orthosc injection and he is also been having trouble with some [facility] arthritis Dr. Durward Allen  will inject  at that time  Follow-Up Instructions: 1 week  Ortho Exam  Patient is alert, oriented, no adenopathy, well-dressed, normal affect, normal respiratory effort. Right knee no erythema no effusion compartments of the lower leg are soft and nontender no warmth.  Imaging: No results found. No images are attached to the encounter.  Labs: Lab Results  Component Value Date   HGBA1C 6.7 (H) 09/27/2021   HGBA1C 5.8 04/02/2021   HGBA1C 6.3 11/30/2020   LABURIC 7.0 03/14/2014   REPTSTATUS 04/21/2021 FINAL 04/21/2021   REPTSTATUS 04/26/2021 FINAL 04/21/2021   GRAMSTAIN  04/21/2021    WBC PRESENT,BOTH PMN AND MONONUCLEAR NO ORGANISMS SEEN CYTOSPIN SMEAR Performed at [facility], [place] [address] [place], [place] [address]    CULT  04/21/2021    NO GROWTH 5 DAYS Performed at [facility] [address] [place], [place] [address]    LABORGA NO GROWTH 11/04/2016     Lab Results  Component Value Date   ALBUMIN 3.8 10/02/2021   ALBUMIN 3.5 09/27/2021   ALBUMIN 2.6 (L) 04/20/2021   PREALBUMIN 10 (L) 04/02/2021    Lab Results  Component Value Date    MG 2.0 05/09/2021   MG 1.7 05/02/2021   MG 2.0 04/30/2021   Lab Results  Component Value Date   VD25OH 76.09 08/10/2020   VD25OH 74.75 04/20/2020   VD25OH 63 12/16/2019    Lab Results  Component Value Date   PREALBUMIN 10 (L) 04/02/2021      Latest Ref Rng & Units 09/27/2021    9:12 AM 05/24/2021    2:56 PM 05/14/2021   12:11 PM  CBC EXTENDED  WBC 4.0 - 10.5 K/uL 7.4  6.3  5.6   RBC 4.22 - 5.81 Mil/uL 5.07  4.29  4.15   Hemoglobin 13.0 - 17.0 g/dL 13.7  11.8  11.4   HCT 39.0 - 52.0 % 43.1  36.4  35.7   Platelets 150.0 - 400.0 K/uL 175.0  142  183.0      There is no height or weight on file to calculate BMI.  Orders:  No orders of the defined types were placed in this encounter.  No orders of the defined types were placed in this encounter.    Procedures: Large Joint Inj on 11/21/2021 4:36 PM Details: 25 G needle, anteromedial approach Medications: 30 mg Hyaluronan 30 MG/2ML   Clinical Data:  No additional findings.  ROS:  All other systems negative, except as noted in the HPI. Review of Systems  Objective: Vital Signs: There were no vitals taken for this visit.  Specialty Comments:  No specialty comments available.  PMFS History: Patient Active Problem List   Diagnosis Date Noted   Permanent atrial fibrillation ([place]) 05/24/2021   Malnutrition of moderate degree 04/27/2021   Contusion 04/02/2021   Unilateral primary osteoarthritis, right knee 02/01/2021   Fatty liver 12/01/2020   Pedal edema 08/14/2020   Knee pain, bilateral 09/20/2019   Morbid obesity ([place]) 09/14/2019   Urinary frequency 11/06/2016   S/P placement of cardiac pacemaker 10/24/2016   Chronic diastolic CHF (congestive heart failure) ([place]) 10/24/2016   Cardiac device in situ    Debility 08/15/2016   Vitamin D deficiency 07/17/2015   Preventative health care 07/17/2015   Lumbago 12/02/2014   Pain of toe of left foot 03/14/2014   Essential hypertension 01/13/2013   Insomnia  06/27/2011   Osteopenia 09/02/2010   Family history of colon cancer 09/02/2010   Diabetes mellitus with chronic kidney disease ([place])    Hyperlipidemia LDL goal <70    CTS (carpal tunnel syndrome) 08/28/2010   Past Medical History:  Diagnosis Date   Arthritis    Bradycardia    Cataract    Chronic kidney disease stage III (GFR 30-59 ml/min) 05/03/2012   Colon polyps    Complication of anesthesia    emesis   Diabetes mellitus    GERD (gastroesophageal reflux disease)    HOH (hard of hearing)    HTN (hypertension) 01/13/2013   Hyperlipidemia    Hypertension    Pacemaker 12/2016   Persistent atrial fibrillation (HCC)    Presence of permanent cardiac pacemaker 09/20/2016   Symptomatic bradycardia    Vitamin D deficiency 07/17/2015    Family History  Problem Relation Age of Onset   Cancer Mother        colon, breast, pancreas, skin cancer   Heart disease Father        heart valve replaced   Stroke Father    Diabetes Father    Cancer Paternal Grandmother        colon   Obesity Son    Heart disease Son        bradycardia    Past Surgical History:  Procedure Laterality Date   ANKLE ARTHROPLASTY     BIV PACEMAKER INSERTION CRT-P N/A 06/22/2021   Procedure: UPGRADE TO BIV PACEMAKER INSERTION CRT-P;  Surgeon: Deboraha Sprang, MD;  Location: [facility];  Service: Cardiovascular;  Laterality: N/A;   CARDIOVERSION N/A 08/20/2016   Procedure: CARDIOVERSION;  Surgeon: Sanda Klein, MD;  Location: [facility];  Service: Cardiovascular;  Laterality: N/A;   CATARACT EXTRACTION  2010, 2014   CATARACT EXTRACTION  02/28/14   CHOLECYSTECTOMY     CHOLECYSTECTOMY N/A 05/03/2012   Procedure: LAPAROSCOPIC CHOLECYSTECTOMY WITH INTRAOPERATIVE CHOLANGIOGRAM;  Surgeon: [name], MD;  Location: [facility];  Service: General;  Laterality: N/A;   INSERT / REPLACE / REMOVE PACEMAKER  09/20/2016   PACEMAKER IMPLANT N/A 09/20/2016   Procedure: Pacemaker Implant;  Surgeon: Deboraha Sprang, MD;   Location: [facility];  Service: Cardiovascular;  Laterality: N/A;   ROTATOR CUFF REPAIR     SHOULDER SURGERY     TONSILLECTOMY     Social History   Occupational History   Not on file  Tobacco Use   Smoking status: Never   Smokeless tobacco: Never  Vaping Use   Vaping Use: Never used  Substance and Sexual Activity   Alcohol use: Yes    Alcohol/week: 1.0 standard drink of alcohol    Types: 1 Glasses of wine per week   Drug use: No   Sexual activity: Not on file    Comment: lives alone , no major dietary restrictions, 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.

## 2021-11-21 NOTE — Patient Instructions (Signed)
Vincent Allen It was a pleasure speaking with you today.  Below is a summary of your health goals and summary of our recent visit. You can also view your updated Chronic Care Management Care plan through your MyChart account.    Patient Goals:  Continue current medications  Monitor for signs and symptoms of heart failure / fluid retention - weight gain, SOB, abdominal fullness, swelling in legs or abdomen, Fatigue and weakness, changes in ability to perform usual activities, persistent cough or wheezing with white or pink blood-tinged mucus, nausea and lack of appetite Continue to weigh daily - report weight gain of more than 3 lbs in 24 hours or 5 lbs in 1 week.  As always if you have any questions or concerns especially regarding medications, please feel free to contact me either at the phone number below or with a MyChart message.   Keep up the good work!  Vincent Allen, PharmD Clinical Pharmacist [place] [phone] (direct line)  [phone] (main office number)   The patient verbalized understanding of instructions, educational materials, and care plan provided today and DECLINED offer to receive copy of patient instructions, educational materials, and care plan.

## 2021-11-21 NOTE — Chronic Care Management (AMB) (Signed)
Chronic Care Management Pharmacy Note  11/21/2021 Name:  Vincent Allen MRN:  [id number] DOB:  03-24-1933  Summary:  Type 2 DM: Patient is taking glipizide 41m - 0.5 tablet = 538mdaily, Januvia 5073m 0.5 tablet = 43m69mily and Jardiance 10mg19mly. Last A1c was 6.7%. No change in medications recommended.  Medication Management: Patient approved for medication assistance program for Eliquis and Jardiance at the end of August 2023. Received first delivery of both medications on September 2023. Enrollement ends 03/17/2022.     Follow up: Clinical Pharmacist Practitioner follow up in 2 to 3 months to restart medication assistance program applications for [id number]: Vincent Allen 88 y.88 year old male who is a primary patient of [name]  The CCM team was consulted for assistance with disease management and care coordination needs.    Engaged with patient and son and patient by telephone for follow up visit in response to provider referral for pharmacy case management and/or care coordination services.   Consent to Services:  The patient was given information about Chronic Care Management services, agreed to services, and gave verbal consent prior to initiation of services.  Please see initial visit note for detailed documentation.   Patient Care Team: BlythMosie Lukesas PCP - General (Family Medicine) HiltyDebara PicketteNadean Corwinas PCP - Cardiology (Cardiology) KleinDeboraha Sprangas PCP - Electrophysiology (Cardiology) GroatClent Jacksas Consulting Physician (Ophthalmology) JonesDanella Sensingas Consulting Physician (Dermatology) NelsoDorothy Sparkas Consulting Physician (Cardiology) WhitfGarald Baldingas Consulting Physician (Orthopedic Surgery) Regal, NormaTamala Fothergill as Consulting Physician (Podiatry) EckarCherre Robins-CPP (Pharmacist)  Recent Office Visits:  10/02/2021 - Fam Med (Dr BlythCharlett Blaken for follow up. Checked BNP and CMP.  Recommended furosemide 40mg 57me a day for 5 days, then 40mg d13m. 07/11/2021 - Fam Med (Dr [name]) [name] Call. Son reports weight loss from 198 to 187 over the last 3 weeks. Eating habits the same. Nutrition consult offered. Suggested add 2 meal supplements of Boost or Endure daily. Appt in 4 weeks with PCP. 06/19/2021 - Fam Med (Dr [name]) Charlett Blakentative health visit and F/U chronic conditions. Metoprolol 43mg an74mtassium was removed from patient's med list during appointment but not reason stated.  05/14/2021 - Fam Med (Beck, NOlevia Bowensen for hospital follow up. No med changes noted. Checked CBC and CMP - Scr was up a little at 2.19 and eGFR was 26.75.  Recent Consults:  10/25/2021 - Cardio (Dr Hilty) SDebara Pickettor congestive cardiomyopathy and afib. NYHA class II. Started low dose losartan 43mg dai62mor CHF / HTN. Started ezetimibe 10mg dail54mth LDL goal of < 70. Ordered repeat ECHO. Lipids and BMP recheck in 3 months.  10/19/2021 - Podiatrist (Dr Galaway) SElisha Ponder diabetic foot care and mycotic toenails. Nails debrided. F/U 3 months. 10/04/2021 - Cardio (Dr Klein) F/UCaryl Comesb. The patient is significantly improved following CRT upgrade.  In about 2 or 3 months repeat echocardiogram to assess left ventricular function.  Recommended follow-up with Dr. Hilty.  DrDebara Pickettn to oCaryl Comes Dr. Hilty as tDebara Pickettther should resume spironolactone or possibly ARB/ACE 09/11/2021 - Podiatry (Dr Hyatt) folMilinda Pointerp of capsulitis dorsal aspect of left foot. Resolving gouty capsulitis. No med changes noted. 07/13/2021 - Podiatry - Diabetic foot care.  07/04/2021 - Cardio (Payne, RNRollene Rotundaclinic - wound check post pacemaker placement.  05/28/2021 - [facility]. No  med changes noted.  05/24/2021 - Cardio (Dr Caryl Comes) F/U CHF and afib with pacemaker. Discussed CRTP implantation. Patient to hold ELiquis prior to procedure. Discontinued metoprolol (for now) due to low BP. 05/11/2021 - Cardio (Transition of Care Team -  [facility]) Seen for CHF hospital follow up. No medication changes noted.    Hospital visits: 06/22/2021 [facility] Admisstion at [facility] placement / upgrade by Dr Caryl Comes.   04/20/2021 to 05/03/2021 Hospitalization at [facility]  for CHF.  Medications stated during hospitalization: Jardiance 45m daily, potsssium 20 mEq -  2 tablets daily and spironolactone 12.519mdaily. Medications stopped during hospitalization: silodosin 101m7mRapaflo), amlodipine 5mg80malcium carbonate, finasterine 5mg,26mimaterene-HCTZ 75-50mg,37mamin D Medications changed during hospitalization: metoprolol decreased from 25mg t73m a day to 25mg on69m day. Furosemide increased form 20 to 40mg dai80m 80mg dail67m Objective:  Lab Results  Component Value Date   CREATININE 1.78 (H) 10/02/2021   CREATININE 1.82 (H) 09/27/2021   CREATININE 2.06 (H) 05/24/2021    Lab Results  Component Value Date   HGBA1C 6.7 (H) 09/27/2021   Last diabetic Eye exam:  Lab Results  Component Value Date/Time   HMDIABEYEEXA No Retinopathy 08/08/2021 12:00 AM    Last diabetic Foot exam: No results found for: "HMDIABFOOTEX"      Component Value Date/Time   CHOL 161 09/27/2021 0912   TRIG 90.0 09/27/2021 0912   HDL 41.30 09/27/2021 0912   CHOLHDL 4 09/27/2021 0912   VLDL 18.0 09/27/2021 0912   LDLCALC 102 (H) 09/27/2021 0912   LDLCALC 101 (H) 12/16/2019 1543   LDLDIRECT 65.0 11/03/2018 1038       Latest Ref Rng & Units 10/02/2021   12:09 PM 09/27/2021    9:12 AM 04/20/2021    6:10 PM  Hepatic Function  Total Protein 6.0 - 8.3 g/dL 7.3  6.8  6.5   Albumin 3.5 - 5.2 g/dL 3.8  3.5  2.6   AST 0 - 37 U/L _0 ALT 0 - 53 U/L _1 Alk Phosphatase 39 - 117 U/L 77  75  65   Total Bilirubin 0.2 - 1.2 mg/dL 0.9  0.9  1.4   Bilirubin, Direct 0.0 - 0.2 mg/dL   0.5     Lab Results  Component Value Date/Time   TSH 3.74 09/27/2021 09:12 AM   TSH 4.58 04/02/2021 02:56 PM   FREET4 1.01  08/11/2020 04:25 PM       Latest Ref Rng & Units 09/27/2021    9:12 AM 05/24/2021    2:56 PM 05/14/2021   12:11 PM  CBC  WBC 4.0 - 10.5 K/uL 7.4  6.3  5.6   Hemoglobin 13.0 - 17.0 g/dL 13.7  11.8  11.4   Hematocrit 39.0 - 52.0 % 43.1  36.4  35.7   Platelets 150.0 - 400.0 K/uL 175.0  142  183.0     Lab Results  Component Value Date/Time   VD25OH 76.09 08/10/2020 03:13 PM   VD25OH 74.75 04/20/2020 11:06 AM    Clinical ASCVD: Yes  The ASCVD Risk score (Arnett DK, et al., 2019) failed to calculate for the following reasons:   The 2019 ASCVD risk score is only valid for ages 40 to 79  56 Soc43l History   Tobacco Use  Smoking Status Never  Smokeless Tobacco Never   BP Readings from Last 3 Encounters:  10/25/21 (!)Marland Kitchen  141/82  10/04/21 126/84  10/02/21 124/88   Pulse Readings from Last 3 Encounters:  10/25/21 75  10/04/21 66  10/02/21 65   Wt Readings from Last 3 Encounters:  10/25/21 188 lb (85.3 kg)  10/04/21 186 lb 3.2 oz (84.5 kg)  10/02/21 185 lb (83.9 kg)    Assessment: Review of patient past medical history, allergies, medications, health status, including review of consultants reports, laboratory and other test data, was performed as part of comprehensive evaluation and provision of chronic care management services.   SDOH:  (Social Determinants of Health) assessments and interventions performed:  SDOH Interventions    Flowsheet Row Chronic Care Management from 10/12/2021 in [facility] at [place] Management from 09/07/2021 in [place] at [place] Management from 04/16/2021 in [place] at [facility] [place] Management from 01/12/2021 in [place] at [place] Management from 07/13/2020 in [place] at [place] Interventions       Transportation Interventions -- -- --  -- Intervention Not Indicated  Financial Strain Interventions -- Other (Comment)  [assisting with PAP for expensive medications] --  [starting process for Januvia PAP - provided application but will hold off until after patient sees nephrologist] Other (Comment)  [Getting Januvia from PAP,  Wil assist with Eliquis PAP if eligible] --  Physical Activity Interventions -- -- -- -- Other (Comments)  [activity limited by instable gait]  Stress Interventions Intervention Not Indicated -- -- -- --           CCM Care Plan  Allergies  Allergen Reactions   Atorvastatin     Myalgias / leg pain and weakness   Penicillins Hives and Itching    Has patient had a PCN reaction causing immediate rash, facial/tongue/throat swelling, SOB or lightheadedness with hypotension:Yes Has patient had a PCN reaction causing severe rash involving mucus membranes or skin necrosis:No Has patient had a PCN reaction that required hospitalization:No Has patient had a PCN reaction occurring within the last 10 years:No If all of the above answers are "NO", then may proceed with Cephalosporin use.    Verapamil     Junctional rhythm   Influenza Vaccines Rash    tinnitus    Medications Reviewed Today     Reviewed by Cherre Robins, RPH-CPP (Pharmacist) on 11/21/21 at [place] List Status: <None>   Medication Order Taking? Sig Documenting Provider Last Dose Status Informant  acetaminophen (TYLENOL) 500 MG tablet [id number] Yes Take 500 mg by mouth every 6 (six) hours as needed for moderate pain. [provider] Taking Active Child  ELIQUIS 2.5 MG TABS tablet [id number] Yes TAKE 1 TABLET BY MOUTH TWICE A DAY [name], MD Taking Active Child           Med Note Antony Contras, [place] Nov 21, 2021 10:58 AM) Approved 08/29 thru 03/17/2022 to get from medication assistance program with BMS  empagliflozin (JARDIANCE) 10 MG TABS tablet [id number] Yes Take 1 tablet (10 mg total) by mouth daily. [name], MD  Taking Active            Med Note Antony Contras, [place] B   Wed Nov 21, 2021 10:59 AM) [place] 11/09/2021 thru 03/17/2022 to get thru medication assistance program with [employer]  ezetimibe (ZETIA) 10 MG tablet [id number] Yes Take 1 tablet (10 mg total) by mouth  daily. Pixie Casino, MD Taking Active   furosemide (LASIX) 40 MG tablet [id number] Yes Take 0.5-1 tablets (20-40 mg total) by mouth daily. TAKE $RemoveBef'40MG'PjaSVmqBIH$  TWICE DAILY FOR 5 DAYS, THEN $RemoveBe'40MG'rzFfISYRi$  ONCE DAILY [name], MD Taking Active   glipiZIDE (GLUCOTROL) 5 MG tablet [id number] Yes Take 1 tablet (5 mg total) by mouth daily before breakfast. [name], MD Taking Active            Med Note [facility], [place] B   Fri Sep 07, 2021 10:19 AM) Has $Remov'10mg'sTyZZW$  glipizide current taking 0.5 tablet daily  glucose blood (ONE TOUCH ULTRA TEST) test strip [id number] Yes USE 1 STRIP 2 TIMES DAILY TO CHECK BLOOD SUGAR DX E08.22, N18.3 [name], MD Taking Active Child  Krill Oil 1000 MG CAPS [id number] Yes Take 1,000 mg by mouth daily. [provider] Taking Active Child  losartan (COZAAR) 25 MG tablet [id number] Yes Take 1 tablet (25 mg total) by mouth daily. Pixie Casino, MD Taking Active   Multiple Vitamin (MULTIVITAMIN WITH MINERALS) TABS tablet [id number] Yes Take 1 tablet by mouth daily. [provider] Taking Active Child  sitaGLIPtin (JANUVIA) 25 MG tablet [id number] Yes Take 1 tablet (25 mg total) by mouth daily. [name], MD Taking Active            Med Note Antony Contras, [place]   Wed Nov 21, 2021 10:59 AM) Medication assistance program approved thru 03/17/2022  tamsulosin (FLOMAX) 0.4 MG CAPS capsule [id number] Yes Take 1 capsule (0.4 mg total) by mouth at bedtime. Pixie Casino, MD Taking Active Child           Med Note [facility] [place]   Tue May 29, 2021 10:36 AM)    Zinc 50 MG TABS [id number] Yes Take 50 mg by mouth daily. [provider] Taking Active Child            Patient Active Problem List   Diagnosis Date  Noted   Permanent atrial fibrillation ([place]) 05/24/2021   Malnutrition of moderate degree 04/27/2021   Contusion 04/02/2021   Unilateral primary osteoarthritis, right knee 02/01/2021   Fatty liver 12/01/2020   Pedal edema 08/14/2020   Knee pain, bilateral 09/20/2019   Morbid obesity ([place]) 09/14/2019   Urinary frequency 11/06/2016   S/P placement of cardiac pacemaker 10/24/2016   Chronic diastolic CHF (congestive heart failure) ([place]) 10/24/2016   Cardiac device in situ    Debility 08/15/2016   Vitamin D deficiency 07/17/2015   Preventative health care 07/17/2015   Lumbago 12/02/2014   Pain of toe of left foot 03/14/2014   Essential hypertension 01/13/2013   Insomnia 06/27/2011   Osteopenia 09/02/2010   Family history of colon cancer 09/02/2010   Diabetes mellitus with chronic kidney disease ([place])    Hyperlipidemia LDL goal <70    CTS (carpal tunnel syndrome) 08/28/2010    Immunization History  Administered Date(s) Administered   Fluad Quad(high Dose 65+) 01/16/2021   Pneumococcal Conjugate-13 03/23/2015   Pneumococcal Polysaccharide-23 06/27/2011   Tdap 05/19/2014    Conditions to be addressed/monitored: Atrial Fibrillation, CHF, CAD, HTN, HLD, DMII and osteopenia; CKD - stage 3b  Care Plan : General Pharmacy (Adult)  Updates made by Cherre Robins, RPH-CPP since 11/21/2021 12:00 AM     Problem: Provide education, support and care coordination for medication therapy and chronic conditions   Priority: High  [place] Date: 07/13/2020  Note:   CARE PLAN ENTRY   Current Barriers:  Decreasing renal  function - evaluate for need to adjust medications Not checking blood pressure or weight daily - goal met  Needs assistance with cost of medications - reached coverage gap   Hypertension / High Blood pressure  BP goal < 130/80  Last office blood pressure was not at goal Current regimen:  Metoprolol succinate 40m daily  Losartan 230mdaily Previous meds tried:  triamterene-hydrochlorothiazide (stopped due to increase in Scr), amlodipine - stopped due to edema Interventions:  Continue current hypertension therapy Continue to check blood pressure 2 to 3 times per week and record. Reviewed blood pressure goal  Chronic Heart Failure (HFrEF) / chronic venous insufficiency:  Recent exacerbation requiring hospitalization for 2 weeks;  Goal: prevention of CHF exacerbations and minimize symptoms of CHF. Current Regimen:  Furosemide 4023m take 1 or 2 tablets daily Metoprolol succinate ER 22m44mce a day  Jardiance 10mg70mly   Losartan 22mg 3my  Last EF = 40-45% Previous medication tried: spironolactone - stopped due ot Scr increase and elevated potassium Patient is weighing every day now. Today's weight was 181.3 lbs (down about 5 lbs over the last 10 days) Denies shortness of breath; little edema  Wearing compression hose Potassium was discontinued 05/10/2021 - Last serum potassium was 3.7 (09/27/2021). Patient complains of some weakness and twitching in him legs. He is worried that potassium could be low.  Approved for Jardiance medication assistance program from 11/09/2021 thru 03/17/2022 Interventions:  Discussed signs and symptoms of CHF exacerbation - weight gain, SOB, abdominal fullness, swelling in legs or abdomen, Fatigue and weakness, changes in ability to perform usual activities, persistent cough or wheezing with white or pink blood-tinged mucus, nausea and lack of appetite Continue to weigh daily - report weight gain of more than 3 lbs in 24 hours or 5 lbs in 1 week.    Hyperlipidemia  LDL goal < 70 - not currently at goal (last LDL was 102)  Tried atorvastatin in past - caused leg pain and weakness Current regimen:  Diet and exercise management   Krill Oil 1000mg d41m Ezetimibe 10mg da27m Interventions:  Hold ezetimibe for 1 to 2 weeks to see if numbness in hands improved. Report to Dr Hilty. RDebara Pickettend limiting intake of food  high in saturated fat / avoid fried foods; increase intake of vegetables Continue Krill Oil  Diabetes Lab Results  Component Value Date   HGBA1C 6.7 (H) 09/27/2021  A1c goal < 7.0%; A1c is at goal Current regimen:  Januvia 22mg onc43mday (taking 0.5 tablet of 50mg) Jar48mce 10mg daily45mough at current Scr might not be getting much blood glucose lowering from Jardiance) Glipizide 10mg - take9m tablets = 5mg daily Ch33ms BG daily: usually is 90 to 110 Approved for [employer] patient assistance program 09/15/2021 thru 03/17/2022. See above regarding Jardiance medication assistance program  Approved for Jardiance medication assistance program from 11/09/2021 thru 03/17/2022 Interventions: Continue lower dose of Januvia to 22mg daily Co71mue to check blood glucose / sugar 1 to 2 times per day. Notify PCP office if experience blood glucose < 80 Consider less aggressive A1c goal of <7.5% given patient's age and CVD history.  Reviewed home blood glucose readings and reviewed goals  Fasting blood glucose goal (before meals) = 80 to 130 Blood glucose goal after a meal = less than 180  Reviewed how to treat hypoglycemia  Atrial fibrillation  Current regimen:  Eliquis 2.5mg twice dail98mn morning and with dinner Metoprolol succinate 22mg once a day80mient has  reached coverage gap in 2023 and has spent 3% of income (based on patient reported income this would be about $700) out of pocket. However [employer] / Eliquis denied his application for patient assistance program stating that they performed a soft income check and income was higher than reported on application and that he would needed to spend $1200 out of pocket to qualify for medication assistance program. However they stated this income check is not always correct. They are requesting patient to provide copy of monthly Social Security income. Provided Medicare Extra Assistance / LIS denial letter which verifies patient's income.  Patient has met 3% out of pocket.  11/21/2021 - Verified with BMS, Eliquis approved to get thru  medication assistance program from 11/13/2021 thru 03/17/2022 Interventions:   Maintain current atrial fibrillation medication regimen  Decreased Kidney Function:  Current regimen:  Jardiance 57m daily Interventions: Reviewed medications to see if any need adjustment based on current renal function.   Continue with plan to follow up with nephrologist Avoid over the counter pain relievers like Aleve / naproxen; Motrin / ibuprofen. If need medication for pain relief, headaches or fever - Tylenol / acetaminophen 5045mup to 2 tablets every 8 hours is safer option.   Osteopenia:  Last DEXA 12/2019 Lowest T-Score = -2.4 at left femur neck ;  Right femur neck = -1.4 History of fracture of foot and shoulder. Per patient sustained from fall from standing position     10/02/2021   11:15 AM 06/19/2021   11:43 AM 01/01/2021    1:15 PM 07/13/2020   11:38 AM 10/06/2019    1:53 PM  Fall Risk   Falls in the past year? 0 0 0 0 0  Number falls in past yr: 0 0 0 0 0  Injury with Fall? 0 0 0 0 0  Risk for fall due to : No Fall Risks Impaired balance/gait;Impaired mobility No Fall Risks History of fall(s);Impaired balance/gait   Follow up Falls evaluation completed Falls evaluation completed Falls prevention discussed Falls prevention discussed;Falls evaluation completed Education provided;Falls prevention discussed  Interventions: (addressed at previous visit) Continue to take calcium + D daily Discussed fall prevention Continue to use cane for ambulation assistance Consider recheck DEXA in 2023  BPH / nocturia:  Not controlled Patient previously took Rapaflo (not effective) and finasteride (patient unsure why stopped) Current therapy:  Tamsulosin 0.44m13maily Interventions:  Continue tamsulosin Continue with plan to follow up with nephrologist and urologist   Medication management Pharmacist  Clinical Goal(s): Over the next 90 days, patient will work with PharmD and providers to achieve optimal medication adherence [facility] Interventions Comprehensive medication review performed. Continue current medication management strategy  Patient Goals:  Continue current medications  Monitor for signs and symptoms of heart failure / fluid retention - weight gain, SOB, abdominal fullness, swelling in legs or abdomen, Fatigue and weakness, changes in ability to perform usual activities, persistent cough or wheezing with white or pink blood-tinged mucus, nausea and lack of appetite Continue to weigh daily - report weight gain of more than 3 lbs in 24 hours or 5 lbs in 1 week.  Please see past updates related to this goal by clicking on the "Past Updates" button in the selected goal        Medication Assistance: approved for patient assistance for Januvia, JarVania Read Eliquis through 03/17/2022    Patient's [facility] pharmacy is:  [id number] [place] - [address] [place]  [address] [place] [address] Phone: [phone] Fax: [phone]  Follow Up:  Patient agrees to Care Plan and Follow-up.    Telephone follow up appointment with care management team member scheduled for:  2 to 3 months.   Cherre Robins, PharmD Clinical Pharmacist [facility]

## 2021-11-28 ENCOUNTER — Encounter: Payer: Self-pay | Admitting: Physician Assistant

## 2021-11-28 ENCOUNTER — Ambulatory Visit: Payer: PPO | Admitting: Physician Assistant

## 2021-11-28 DIAGNOSIS — M1711 Unilateral primary osteoarthritis, right knee: Secondary | ICD-10-CM | POA: Diagnosis not present

## 2021-11-28 MED ORDER — HYALURONAN 30 MG/2ML IX SOSY
30.0000 mg | PREFILLED_SYRINGE | INTRA_ARTICULAR | Status: AC | PRN
  Administered 2021-11-28: 30 mg via INTRA_ARTICULAR

## 2021-11-28 NOTE — Progress Notes (Signed)
Office Visit Note   Patient: Vincent Allen           Date of Birth: 1933/12/06           MRN: [id number] Visit Date: 11/28/2021              Requested by: [name], MD [address] [place],  [place] [id number] PCP: [name], MD  Chief Complaint  Patient presents with  . Right Knee - Follow-up    Orthovisc      HPI: Vincent Allen is here for his second Synvisc injection into his right knee.  He has done well with the first injection.  Assessment & Plan: Visit Diagnoses:  1. Unilateral primary osteoarthritis, right knee     Plan: Patient will follow-up in 1 week with Dr. Durward Fortes.  He also has a history of CMC arthritis.  Can go forward with a steroid injection at that time  Follow-Up Instructions: 1 week  Ortho Exam  Patient is alert, oriented, no adenopathy, well-dressed, normal affect, normal respiratory effort. Right knee no effusion no redness no erythema compartments of the lower leg and soft and nontender  Imaging: No results found. No images are attached to the encounter.  Labs: Lab Results  Component Value Date   HGBA1C 6.7 (H) 09/27/2021   HGBA1C 5.8 04/02/2021   HGBA1C 6.3 11/30/2020   LABURIC 7.0 03/14/2014   REPTSTATUS 04/21/2021 FINAL 04/21/2021   REPTSTATUS 04/26/2021 FINAL 04/21/2021   GRAMSTAIN  04/21/2021    WBC PRESENT,BOTH PMN AND MONONUCLEAR NO ORGANISMS SEEN CYTOSPIN SMEAR Performed at [facility], [place] [address], [place], [place] [address]    CULT  04/21/2021    NO GROWTH 5 DAYS Performed at [facility] [address] [place], [place] [address]    LABORGA NO GROWTH 11/04/2016     Lab Results  Component Value Date   ALBUMIN 3.8 10/02/2021   ALBUMIN 3.5 09/27/2021   ALBUMIN 2.6 (L) 04/20/2021   PREALBUMIN 10 (L) 04/02/2021    Lab Results  Component Value Date   MG 2.0 05/09/2021   MG 1.7 05/02/2021   MG 2.0 04/30/2021   Lab Results  Component Value Date   VD25OH 76.09  08/10/2020   VD25OH 74.75 04/20/2020   VD25OH 63 12/16/2019    Lab Results  Component Value Date   PREALBUMIN 10 (L) 04/02/2021      Latest Ref Rng & Units 09/27/2021    9:12 AM 05/24/2021    2:56 PM 05/14/2021   12:11 PM  CBC EXTENDED  WBC 4.0 - 10.5 K/uL 7.4  6.3  5.6   RBC 4.22 - 5.81 Mil/uL 5.07  4.29  4.15   Hemoglobin 13.0 - 17.0 g/dL 13.7  11.8  11.4   HCT 39.0 - 52.0 % 43.1  36.4  35.7   Platelets 150.0 - 400.0 K/uL 175.0  142  183.0      There is no height or weight on file to calculate BMI.  Orders:  No orders of the defined types were placed in this encounter.  No orders of the defined types were placed in this encounter.    Procedures: Large Joint Inj: R knee on 11/28/2021 1:09 PM Indications: pain and diagnostic evaluation Details: 25 G 1.5 in needle, anteromedial approach  Arthrogram: No  Medications: 30 mg Hyaluronan 30 MG/2ML Outcome: tolerated well, no immediate complications Procedure, treatment alternatives, risks and benefits explained, specific risks discussed. Consent  was given by the patient. Immediately prior to procedure a time out was called to verify the correct patient, procedure, equipment, support staff and site/side marked as required. Patient was prepped and draped in the usual sterile fashion.    Clinical Data: No additional findings.  ROS:  All other systems negative, except as noted in the HPI. Review of Systems  Objective: Vital Signs: There were no vitals taken for this visit.  Specialty Comments:  No specialty comments available.  PMFS History: Patient Active Problem List   Diagnosis Date Noted  . Permanent atrial fibrillation ([place]) 05/24/2021  . Malnutrition of moderate degree 04/27/2021  . Contusion 04/02/2021  . Unilateral primary osteoarthritis, right knee 02/01/2021  . Fatty liver 12/01/2020  . Pedal edema 08/14/2020  . Knee pain, bilateral 09/20/2019  . Morbid obesity ([place]) 09/14/2019  . Urinary frequency  11/06/2016  . S/P placement of cardiac pacemaker 10/24/2016  . Chronic diastolic CHF (congestive heart failure) ([place]) 10/24/2016  . Cardiac device in situ   . Debility 08/15/2016  . Vitamin D deficiency 07/17/2015  . Preventative health care 07/17/2015  . Lumbago 12/02/2014  . Pain of toe of left foot 03/14/2014  . Essential hypertension 01/13/2013  . Insomnia 06/27/2011  . Osteopenia 09/02/2010  . Family history of colon cancer 09/02/2010  . Diabetes mellitus with chronic kidney disease ([place])   . Hyperlipidemia LDL goal <70   . CTS (carpal tunnel syndrome) 08/28/2010   Past Medical History:  Diagnosis Date  . Arthritis   . Bradycardia   . Cataract   . Chronic kidney disease stage III (GFR 30-59 ml/min) 05/03/2012  . Colon polyps   . Complication of anesthesia    emesis  . Diabetes mellitus   . GERD (gastroesophageal reflux disease)   . HOH (hard of hearing)   . HTN (hypertension) 01/13/2013  . Hyperlipidemia   . Hypertension   . Pacemaker 12/2016  . Persistent atrial fibrillation ([place])   . Presence of permanent cardiac pacemaker 09/20/2016  . Symptomatic bradycardia   . Vitamin D deficiency 07/17/2015    Family History  Problem Relation Age of Onset  . Cancer Mother        colon, breast, pancreas, skin cancer  . Heart disease Father        heart valve replaced  . Stroke Father   . Diabetes Father   . Cancer Paternal Grandmother        colon  . Obesity Son   . Heart disease Son        bradycardia    Past Surgical History:  Procedure Laterality Date  . ANKLE ARTHROPLASTY    . BIV PACEMAKER INSERTION CRT-P N/A 06/22/2021   Procedure: UPGRADE TO BIV PACEMAKER INSERTION CRT-P;  Surgeon: Deboraha Sprang, MD;  Location: [facility];  Service: Cardiovascular;  Laterality: N/A;  . CARDIOVERSION N/A 08/20/2016   Procedure: CARDIOVERSION;  Surgeon: Sanda Klein, MD;  Location: [facility];  Service: Cardiovascular;  Laterality: N/A;  . CATARACT EXTRACTION  2010,  2014  . CATARACT EXTRACTION  02/28/14  . CHOLECYSTECTOMY    . CHOLECYSTECTOMY N/A 05/03/2012   Procedure: LAPAROSCOPIC CHOLECYSTECTOMY WITH INTRAOPERATIVE CHOLANGIOGRAM;  Surgeon: [name], MD;  Location: [facility];  Service: General;  Laterality: N/A;  . INSERT / REPLACE / REMOVE PACEMAKER  09/20/2016  . PACEMAKER IMPLANT N/A 09/20/2016   Procedure: Pacemaker Implant;  Surgeon: Deboraha Sprang, MD;  Location: [facility];  Service: Cardiovascular;  Laterality: N/A;  .  ROTATOR CUFF REPAIR    . SHOULDER SURGERY    . TONSILLECTOMY     Social History   Occupational History  . Not on file  Tobacco Use  . Smoking status: Never  . Smokeless tobacco: Never  Vaping Use  . Vaping Use: Never used  Substance and Sexual Activity  . Alcohol use: Yes    Alcohol/week: 1.0 standard drink of alcohol    Types: 1 Glasses of wine per week  . Drug use: No  . 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.

## 2021-12-05 ENCOUNTER — Ambulatory Visit: Payer: PPO | Admitting: Orthopaedic Surgery

## 2021-12-05 ENCOUNTER — Encounter: Payer: Self-pay | Admitting: Orthopaedic Surgery

## 2021-12-05 DIAGNOSIS — M1711 Unilateral primary osteoarthritis, right knee: Secondary | ICD-10-CM | POA: Diagnosis not present

## 2021-12-05 DIAGNOSIS — M25561 Pain in right knee: Secondary | ICD-10-CM

## 2021-12-05 DIAGNOSIS — M25562 Pain in left knee: Secondary | ICD-10-CM

## 2021-12-05 DIAGNOSIS — G8929 Other chronic pain: Secondary | ICD-10-CM

## 2021-12-05 MED ORDER — HYALURONAN 30 MG/2ML IX SOSY
30.0000 mg | PREFILLED_SYRINGE | INTRA_ARTICULAR | Status: AC | PRN
  Administered 2021-12-05: 30 mg via INTRA_ARTICULAR

## 2021-12-05 NOTE — Progress Notes (Signed)
Office Visit Note   Patient: Vincent [place]           Date of Birth: 03-14-1934           MRN: [id number] Visit Date: 12/05/2021              Requested by: [name], MD [address] [place],   [id number] PCP: [name], MD   Assessment & Plan: Visit Diagnoses:  1. Chronic pain of both knees   2. Unilateral primary osteoarthritis, right knee     Plan: Third and final Orthovisc injection right knee for chronic osteoarthritis  Follow-Up Instructions: Return if symptoms worsen or fail to improve.   Orders:  No orders of the defined types were placed in this encounter.  No orders of the defined types were placed in this encounter.     Procedures: Large Joint Inj: R knee on 12/05/2021 1:25 PM Indications: pain and joint swelling Details: 25 G 1.5 in needle  Arthrogram: No  Medications: 30 mg Hyaluronan 30 MG/2ML Outcome: tolerated well, no immediate complications Procedure, treatment alternatives, risks and benefits explained, specific risks discussed. Consent was given by the patient. Immediately prior to procedure a time out was called to verify the correct patient, procedure, equipment, support staff and site/side marked as required. Patient was prepped and draped in the usual sterile fashion.      Clinical Data: No additional findings.   Subjective: Chief Complaint  Patient presents with  . Right Knee - Follow-up    orthovisc  Patient presents today for the third and final orthovisc injection into his right knee.  Relates feeling considerably better after the first 2 injections  HPI  Review of Systems   Objective: Vital Signs: There were no vitals taken for this visit.  Physical Exam  Ortho Exam right knee was not hot red warm or swollen.  Minimal medial lateral joint pain.  Positive patella crepitation but no pain with compression  Specialty Comments:  No specialty comments available.  Imaging: No results  found.   PMFS History: Patient Active Problem List   Diagnosis Date Noted  . Permanent atrial fibrillation ([place]) 05/24/2021  . Malnutrition of moderate degree 04/27/2021  . Contusion 04/02/2021  . Unilateral primary osteoarthritis, right knee 02/01/2021  . Fatty liver 12/01/2020  . Pedal edema 08/14/2020  . Knee pain, bilateral 09/20/2019  . Morbid obesity ([place]) 09/14/2019  . Urinary frequency 11/06/2016  . S/P placement of cardiac pacemaker 10/24/2016  . Chronic diastolic CHF (congestive heart failure) ([place]) 10/24/2016  . Cardiac device in situ   . Debility 08/15/2016  . Vitamin D deficiency 07/17/2015  . Preventative health care 07/17/2015  . Lumbago 12/02/2014  . Pain of toe of left foot 03/14/2014  . Essential hypertension 01/13/2013  . Insomnia 06/27/2011  . Osteopenia 09/02/2010  . Family history of colon cancer 09/02/2010  . Diabetes mellitus with chronic kidney disease ([place])   . Hyperlipidemia LDL goal <70   . CTS (carpal tunnel syndrome) 08/28/2010   Past Medical History:  Diagnosis Date  . Arthritis   . Bradycardia   . Cataract   . Chronic kidney disease stage III (GFR 30-59 ml/min) 05/03/2012  . Colon polyps   . Complication of anesthesia    emesis  . Diabetes mellitus   . GERD (gastroesophageal reflux disease)   . HOH (hard of hearing)   . HTN (hypertension) 01/13/2013  . Hyperlipidemia   . Hypertension   .  Pacemaker 12/2016  . Persistent atrial fibrillation ([place])   . Presence of permanent cardiac pacemaker 09/20/2016  . Symptomatic bradycardia   . Vitamin D deficiency 07/17/2015    Family History  Problem Relation Age of Onset  . Cancer Mother        colon, breast, pancreas, skin cancer  . Heart disease Father        heart valve replaced  . Stroke Father   . Diabetes Father   . Cancer Paternal Grandmother        colon  . Obesity Son   . Heart disease Son        bradycardia    Past Surgical History:  Procedure Laterality Date  . ANKLE  ARTHROPLASTY    . BIV PACEMAKER INSERTION CRT-P N/A 06/22/2021   Procedure: UPGRADE TO BIV PACEMAKER INSERTION CRT-P;  Surgeon: Deboraha Sprang, MD;  Location: [facility];  Service: Cardiovascular;  Laterality: N/A;  . CARDIOVERSION N/A 08/20/2016   Procedure: CARDIOVERSION;  Surgeon: Sanda Klein, MD;  Location: [facility];  Service: Cardiovascular;  Laterality: N/A;  . CATARACT EXTRACTION  2010, 2014  . CATARACT EXTRACTION  02/28/14  . CHOLECYSTECTOMY    . CHOLECYSTECTOMY N/A 05/03/2012   Procedure: LAPAROSCOPIC CHOLECYSTECTOMY WITH INTRAOPERATIVE CHOLANGIOGRAM;  Surgeon: [name], MD;  Location: [facility];  Service: General;  Laterality: N/A;  . INSERT / REPLACE / REMOVE PACEMAKER  09/20/2016  . PACEMAKER IMPLANT N/A 09/20/2016   Procedure: Pacemaker Implant;  Surgeon: Deboraha Sprang, MD;  Location: [facility];  Service: Cardiovascular;  Laterality: N/A;  . ROTATOR CUFF REPAIR    . SHOULDER SURGERY    . TONSILLECTOMY     Social History   Occupational History  . Not on file  Tobacco Use  . Smoking status: Never  . Smokeless tobacco: Never  Vaping Use  . Vaping Use: Never used  Substance and Sexual Activity  . Alcohol use: Yes    Alcohol/week: 1.0 standard drink of alcohol    Types: 1 Glasses of wine per week  . Drug use: No  . 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.

## 2021-12-15 DIAGNOSIS — N401 Enlarged prostate with lower urinary tract symptoms: Secondary | ICD-10-CM

## 2021-12-15 DIAGNOSIS — I5030 Unspecified diastolic (congestive) heart failure: Secondary | ICD-10-CM | POA: Diagnosis not present

## 2021-12-15 DIAGNOSIS — I4891 Unspecified atrial fibrillation: Secondary | ICD-10-CM | POA: Diagnosis not present

## 2021-12-15 DIAGNOSIS — R351 Nocturia: Secondary | ICD-10-CM | POA: Diagnosis not present

## 2021-12-15 DIAGNOSIS — E1159 Type 2 diabetes mellitus with other circulatory complications: Secondary | ICD-10-CM

## 2021-12-15 DIAGNOSIS — I110 Hypertensive heart disease with heart failure: Secondary | ICD-10-CM | POA: Diagnosis not present

## 2021-12-21 ENCOUNTER — Ambulatory Visit (INDEPENDENT_AMBULATORY_CARE_PROVIDER_SITE_OTHER): Payer: PPO

## 2021-12-21 DIAGNOSIS — I5032 Chronic diastolic (congestive) heart failure: Secondary | ICD-10-CM

## 2021-12-25 LAB — CUP PACEART REMOTE DEVICE CHECK
Battery Remaining Longevity: 117 mo
Battery Voltage: 3.02 V
Brady Statistic RA Percent Paced: 0 %
Brady Statistic RV Percent Paced: 99.33 %
Date Time Interrogation Session: 20231006074509
Implantable Lead Implant Date: 20180706
Implantable Lead Implant Date: 20180706
Implantable Lead Implant Date: 20230407
Implantable Lead Location: 753858
Implantable Lead Location: 753859
Implantable Lead Location: 753860
Implantable Lead Model: 5076
Implantable Lead Model: 5076
Implantable Pulse Generator Implant Date: 20230407
Lead Channel Impedance Value: 1140 Ohm
Lead Channel Impedance Value: 1197 Ohm
Lead Channel Impedance Value: 1197 Ohm
Lead Channel Impedance Value: 1292 Ohm
Lead Channel Impedance Value: 1292 Ohm
Lead Channel Impedance Value: 1368 Ohm
Lead Channel Impedance Value: 323 Ohm
Lead Channel Impedance Value: 380 Ohm
Lead Channel Impedance Value: 399 Ohm
Lead Channel Impedance Value: 456 Ohm
Lead Channel Impedance Value: 551 Ohm
Lead Channel Impedance Value: 703 Ohm
Lead Channel Impedance Value: 741 Ohm
Lead Channel Impedance Value: 760 Ohm
Lead Channel Pacing Threshold Amplitude: 0.875 V
Lead Channel Pacing Threshold Amplitude: 1.375 V
Lead Channel Pacing Threshold Pulse Width: 0.4 ms
Lead Channel Pacing Threshold Pulse Width: 0.8 ms
Lead Channel Sensing Intrinsic Amplitude: 0.75 mV
Lead Channel Sensing Intrinsic Amplitude: 0.75 mV
Lead Channel Sensing Intrinsic Amplitude: 7 mV
Lead Channel Sensing Intrinsic Amplitude: 7 mV
Lead Channel Setting Pacing Amplitude: 2 V
Lead Channel Setting Pacing Amplitude: 2 V
Lead Channel Setting Pacing Pulse Width: 0.4 ms
Lead Channel Setting Pacing Pulse Width: 0.8 ms
Lead Channel Setting Sensing Sensitivity: 1.2 mV

## 2021-12-27 NOTE — Progress Notes (Signed)
Remote pacemaker transmission.   

## 2021-12-30 ENCOUNTER — Other Ambulatory Visit: Payer: Self-pay | Admitting: Family Medicine

## 2022-01-15 ENCOUNTER — Ambulatory Visit (INDEPENDENT_AMBULATORY_CARE_PROVIDER_SITE_OTHER): Payer: PPO | Admitting: Family Medicine

## 2022-01-15 DIAGNOSIS — N183 Chronic kidney disease, stage 3 unspecified: Secondary | ICD-10-CM

## 2022-01-15 DIAGNOSIS — E0822 Diabetes mellitus due to underlying condition with diabetic chronic kidney disease: Secondary | ICD-10-CM | POA: Diagnosis not present

## 2022-01-15 DIAGNOSIS — E559 Vitamin D deficiency, unspecified: Secondary | ICD-10-CM

## 2022-01-15 DIAGNOSIS — E785 Hyperlipidemia, unspecified: Secondary | ICD-10-CM | POA: Diagnosis not present

## 2022-01-15 DIAGNOSIS — I4821 Permanent atrial fibrillation: Secondary | ICD-10-CM

## 2022-01-15 DIAGNOSIS — I5032 Chronic diastolic (congestive) heart failure: Secondary | ICD-10-CM | POA: Diagnosis not present

## 2022-01-15 LAB — LIPID PANEL
Cholesterol: 149 mg/dL (ref 0–200)
HDL: 43.8 mg/dL (ref >39.00)
LDL Cholesterol: 83 mg/dL (ref 0–99)
NonHDL: 104.9
Total CHOL/HDL Ratio: 3
Triglycerides: 110 mg/dL (ref 0.0–149.0)
VLDL: 22 mg/dL (ref 0.0–40.0)

## 2022-01-15 LAB — MICROALBUMIN / CREATININE URINE RATIO
Creatinine,U: 36.3 mg/dL
Microalb Creat Ratio: 23.6 mg/g (ref 0.0–30.0)
Microalb, Ur: 8.6 mg/dL — ABNORMAL HIGH (ref 0.0–1.9)

## 2022-01-15 LAB — COMPREHENSIVE METABOLIC PANEL
ALT: 16 U/L (ref 0–53)
AST: 21 U/L (ref 0–37)
Albumin: 3.7 g/dL (ref 3.5–5.2)
Alkaline Phosphatase: 64 U/L (ref 39–117)
BUN: 34 mg/dL — ABNORMAL HIGH (ref 6–23)
CO2: 22 mEq/L (ref 19–32)
Calcium: 9.1 mg/dL (ref 8.4–10.5)
Chloride: 106 mEq/L (ref 96–112)
Creatinine, Ser: 1.65 mg/dL — ABNORMAL HIGH (ref 0.40–1.50)
GFR: 37.38 mL/min — ABNORMAL LOW (ref >60.00)
Glucose, Bld: 95 mg/dL (ref 70–99)
Potassium: 3.3 mEq/L — ABNORMAL LOW (ref 3.5–5.1)
Sodium: 140 mEq/L (ref 135–145)
Total Bilirubin: 1.1 mg/dL (ref 0.2–1.2)
Total Protein: 6.6 g/dL (ref 6.0–8.3)

## 2022-01-15 LAB — HEMOGLOBIN A1C: Hgb A1c MFr Bld: 7.1 % — ABNORMAL HIGH (ref 4.6–6.5)

## 2022-01-15 LAB — CBC
HCT: 44.9 % (ref 39.0–52.0)
Hemoglobin: 14.4 g/dL (ref 13.0–17.0)
MCHC: 32.1 g/dL (ref 30.0–36.0)
MCV: 86.7 fl (ref 78.0–100.0)
Platelets: 137 10*3/uL — ABNORMAL LOW (ref 150.0–400.0)
RBC: 5.18 Mil/uL (ref 4.22–5.81)
RDW: 14.1 % (ref 11.5–15.5)
WBC: 6.2 10*3/uL (ref 4.0–10.5)

## 2022-01-15 LAB — VITAMIN D 25 HYDROXY (VIT D DEFICIENCY, FRACTURES): VITD: 88.08 ng/mL (ref 30.00–100.00)

## 2022-01-15 LAB — TSH: TSH: 2.23 u[IU]/mL (ref 0.35–5.50)

## 2022-01-15 NOTE — Patient Instructions (Signed)
Vitamin D Deficiency Vitamin D deficiency is when your body does not have enough vitamin D. Vitamin D is important to your body because: It helps the body maintain calcium and phosphorus levels. These are important minerals. It plays a role in bone health. It reduces inflammation. It improves the body's defense system (immune system). If vitamin D deficiency is severe, it can cause a condition in which your bones become soft. In adults, this condition is called osteomalacia. In children, this condition is called rickets. What are the causes? This condition may be caused by: Not eating enough foods that contain vitamin D. Not getting enough natural sun exposure. Having certain digestive system diseases that make it difficult for your body to absorb vitamin D. These diseases include Crohn's disease, long-term (chronic) pancreatitis, and cystic fibrosis. Having had a surgery in which a part of the stomach or a part of the small intestine was removed. What increases the risk? You are more likely to develop this condition if you: Are an older adult. Do not spend much time outdoors. Live in a long-term care facility. Have dark skin. Take certain medicines, such as steroid medicines or certain seizure medicines. Are overweight or obese. Have chronic kidney or liver disease. What are the signs or symptoms? In mild cases of vitamin D deficiency, there may not be any symptoms. If the condition is severe, symptoms may include: Bone pain. Muscle pain. Not being able to walk normally (abnormal gait). Broken bones caused by a minor injury. Joint pain. How is this diagnosed? This condition may be diagnosed with blood tests. Imaging tests such as X-rays may also be done to look for changes in the bone. How is this treated? Treatment may include taking supplements as told by your health care provider. Your health care provider will tell you what dose is best for you. Supplements may include: Vitamin  D. Calcium. Follow these instructions at home: Eating and drinking Eat foods that contain vitamin D, such as: Dairy products, cereals, or juices that have vitamin D added to them (are fortified). Check the label. Fish, such as salmon or trout. Eggs. The vitamin D is in the yolk. Mushrooms that were treated with UV light. Beef liver. The items listed above may not be a complete list of foods and beverages you can eat and drink. Contact a dietitian for more information. General instructions Take over-the-counter and prescription medicines only as told by your health care provider. Take supplements only as told by your health care provider. Get regular, safe exposure to natural sunlight. Do not use a tanning bed. Maintain a healthy weight. Lose weight if needed. Keep all follow-up visits. This is important. How is this prevented? You can get vitamin D by: Eating foods that naturally contain vitamin D. Eating or drinking products that have been fortified with vitamin D, such as cereals, juices, and dairy products, including milk. Taking a vitamin D supplement or a multivitamin that contains vitamin D. Being in the sun. Your body naturally makes vitamin D when your skin is exposed to sunlight. Your body changes the sunlight into a form of the vitamin that it can use. Contact a health care provider if: Your symptoms do not go away. You feel nauseous or you vomit. You have fewer bowel movements than usual or are constipated. Summary Vitamin D deficiency is when your body does not have enough vitamin D. Vitamin D helps to keep your bones healthy. Vitamin D deficiency is primarily treated by taking supplements. Your health care  provider will suggest what dose is best for you. You can get vitamin D by eating foods that contain vitamin D, by being in the sun, and by taking a vitamin D supplement or a multivitamin that contains vitamin D. This information is not intended to replace advice given  to you by your health care provider. Make sure you discuss any questions you have with your health care provider. Document Revised: 12/08/2020 Document Reviewed: 12/08/2020 Elsevier Patient Education  [place].

## 2022-01-15 NOTE — Progress Notes (Signed)
 Subjective:   By signing my name below, I, Vincent Allen, attest that this documentation has been prepared under the direction and in the presence of [name], [name], MD., 01/15/2022.     Patient ID: Vincent Allen, male    DOB: 06/21/1933, 88 y.o.   MRN: [id number]  Chief Complaint  Patient presents with   Follow-up    Here for follow up   HPI Patient is in today for an office visit. Patient's son is with him and also speaking on his behalf.  Diet: He attempts to consume an appropriate amount of protein and remain well-hydrated.  Chronic Knee Pain: He reports that he received a 30 mg Hyaluronan injection in his right knee on 12/05/21 to manage his osteoarthritis and tolerated it well.  Supplements: He currently takes vitamin D daily.  Past Medical History:  Diagnosis Date   Arthritis    Bradycardia    Cataract    Chronic kidney disease stage III (GFR 30-59 ml/min) 05/03/2012   Colon polyps    Complication of anesthesia    emesis   Diabetes mellitus    GERD (gastroesophageal reflux disease)    HOH (hard of hearing)    HTN (hypertension) 01/13/2013   Hyperlipidemia    Hypertension    Pacemaker 12/2016   Persistent atrial fibrillation (HCC)    Presence of permanent cardiac pacemaker 09/20/2016   Symptomatic bradycardia    Vitamin D deficiency 07/17/2015   Past Surgical History:  Procedure Laterality Date   ANKLE ARTHROPLASTY     BIV PACEMAKER INSERTION CRT-P N/A 06/22/2021   Procedure: UPGRADE TO BIV PACEMAKER INSERTION CRT-P;  Surgeon: Klein, Steven C, MD;  [facility]: [facility];  Service: Cardiovascular;  Laterality: N/A;   CARDIOVERSION N/A 08/20/2016   Procedure: CARDIOVERSION;  Surgeon: Croitoru, Mihai, MD;  Location: [facility];  Service: Cardiovascular;  Laterality: N/A;   CATARACT EXTRACTION  2010, 2014   CATARACT EXTRACTION  02/28/14   CHOLECYSTECTOMY     CHOLECYSTECTOMY N/A 05/03/2012   Procedure: LAPAROSCOPIC CHOLECYSTECTOMY WITH INTRAOPERATIVE  CHOLANGIOGRAM;  Surgeon: James O Wyatt, MD;  Location: [facility] OR;  Service: General;  Laterality: N/A;   INSERT / REPLACE / REMOVE PACEMAKER  09/20/2016   PACEMAKER IMPLANT N/A 09/20/2016   Procedure: Pacemaker Implant;  Surgeon: Klein, Steven C, MD;  Location: [facility];  Service: Cardiovascular;  Laterality: N/A;   ROTATOR CUFF REPAIR     SHOULDER SURGERY     TONSILLECTOMY     Family History  Problem Relation Age of Onset   Cancer Mother        colon, breast, pancreas, skin cancer   Heart disease Father        heart valve replaced   Stroke Father    Diabetes Father    Cancer Paternal Grandmother        colon   Obesity Son    Heart disease Son        bradycardia   Social History   Socioeconomic History   Marital status: Widowed    Spouse name: Not on file   Number of children: Not on file   Years of education: Not on file   Highest education level: Not on file  Occupational History   Not on file  Tobacco Use   Smoking status: Never   Smokeless tobacco: Never  Vaping Use   Vaping Use: Never used  Substance and Sexual Activity   Alcohol use: Yes    Alcohol/week: 1.0 standard   drink of alcohol    Types: 1 Glasses of wine per week   Drug use: No   Sexual activity: Not on file    Comment: lives alone , no major dietary restrictions, 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  Other Topics Concern   Not on file  Social History Narrative   Not on file   Social Determinants of Health   Financial Resource Strain: Medium Risk (09/07/2021)   Overall Financial Resource Strain (CARDIA)    Difficulty of Paying Living Expenses: Somewhat hard  Food Insecurity: No Food Insecurity (01/01/2021)   Hunger Vital Sign    Worried About Running Out of Food in the Last Year: Never true    Ran Out of Food in the Last Year: Never true  Transportation Needs: No Transportation Needs (01/01/2021)   PRAPARE - Transportation    Lack of Transportation (Medical): No     Lack of Transportation (Non-Medical): No  Physical Activity: Insufficiently Active (01/01/2021)   Exercise Vital Sign    Days of Exercise per Week: 7 days    Minutes of Exercise per Session: 10 min  Stress: No Stress Concern Present (01/01/2021)   [employer] - Occupational Stress Questionnaire    Feeling of Stress : Not at all  Social Connections: Socially Isolated (01/01/2021)   Social Connection and Isolation Panel [NHANES]    Frequency of Communication with Friends and Family: More than three times a week    Frequency of Social Gatherings with Friends and Family: Three times a week    Attends Religious Services: Never    Active Member of Clubs or Organizations: No    Attends Club or Organization Meetings: Never    Marital Status: Widowed  Intimate Partner Violence: Not At Risk (01/01/2021)   Humiliation, Afraid, Rape, and Kick questionnaire    Fear of Current or Ex-Partner: No    Emotionally Abused: No    Physically Abused: No    Sexually Abused: No   Outpatient Medications Prior to Visit  Medication Sig Dispense Refill   acetaminophen (TYLENOL) 500 MG tablet Take 500 mg by mouth every 6 (six) hours as needed for moderate pain.     ELIQUIS 2.5 MG TABS tablet TAKE 1 TABLET BY MOUTH TWICE A DAY 60 tablet 5   empagliflozin (JARDIANCE) 10 MG TABS tablet Take 1 tablet (10 mg total) by mouth daily. 90 tablet 1   ezetimibe (ZETIA) 10 MG tablet Take 1 tablet (10 mg total) by mouth daily. 90 tablet 3   furosemide (LASIX) 40 MG tablet Take 0.5-1 tablets (20-40 mg total) by mouth daily. TAKE 40MG TWICE DAILY FOR 5 DAYS, THEN 40MG ONCE DAILY 90 tablet 1   glipiZIDE (GLUCOTROL) 5 MG tablet Take 1 tablet (5 mg total) by mouth daily before breakfast. 90 tablet 1   glucose blood (ONE TOUCH ULTRA TEST) test strip USE 1 STRIP 2 TIMES DAILY TO CHECK BLOOD SUGAR DX E08.22, N18.3 300 each 1   Krill Oil 1000 MG CAPS Take 1,000 mg by mouth daily.     losartan (COZAAR) 25 MG  tablet Take 1 tablet (25 mg total) by mouth daily. 90 tablet 3   Multiple Vitamin (MULTIVITAMIN WITH MINERALS) TABS tablet Take 1 tablet by mouth daily.     sitaGLIPtin (JANUVIA) 25 MG tablet Take 1 tablet (25 mg total) by mouth daily. 30 tablet 2   tamsulosin (FLOMAX) 0.4 MG CAPS capsule Take 1 capsule (0.4 mg total) by mouth at   bedtime. 90 capsule 3   Zinc 50 MG TABS Take 50 mg by mouth daily.     No facility-administered medications prior to visit.   Allergies  Allergen Reactions   Atorvastatin     Myalgias / leg pain and weakness   Penicillins Hives and Itching    Has patient had a PCN reaction causing immediate rash, facial/tongue/throat swelling, SOB or lightheadedness with hypotension:Yes Has patient had a PCN reaction causing severe rash involving mucus membranes or skin necrosis:No Has patient had a PCN reaction that required hospitalization:No Has patient had a PCN reaction occurring within the last 10 years:No If all of the above answers are "NO", then may proceed with Cephalosporin use.    Verapamil     Junctional rhythm   Influenza Vaccines Rash    tinnitus   ROS    Objective:    Physical Exam Constitutional:      General: He is not in acute distress.    Appearance: Normal appearance. He is not ill-appearing.  HENT:     Head: Normocephalic and atraumatic.     Right Ear: External ear normal.     Left Ear: External ear normal.     Mouth/Throat:     Mouth: Mucous membranes are moist.     Pharynx: Oropharynx is clear.  Eyes:     Extraocular Movements: Extraocular movements intact.     Pupils: Pupils are equal, round, and reactive to light.  Cardiovascular:     Rate and Rhythm: Normal rate and regular rhythm.     Pulses: Normal pulses.     Heart sounds: Normal heart sounds. No murmur heard.    No gallop.  Pulmonary:     Effort: Pulmonary effort is normal. No respiratory distress.     Breath sounds: Normal breath sounds. No wheezing or rales.  Abdominal:      General: Bowel sounds are normal.  Skin:    General: Skin is warm and dry.  Neurological:     Mental Status: He is alert and oriented to person, place, and time.  Psychiatric:        Mood and Affect: Mood normal.        Behavior: Behavior normal.        Judgment: Judgment normal.    BP 130/78 (BP Location: Right Arm, Patient Position: Sitting, Cuff Size: Normal)   Pulse 83   Temp 98 F (36.7 C) (Oral)   Resp 16   Ht 6' (1.829 m)   Wt 189 lb 6.4 oz (85.9 kg)   SpO2 97%   BMI 25.69 kg/m  Wt Readings from Last 3 Encounters:  01/15/22 189 lb 6.4 oz (85.9 kg)  10/25/21 188 lb (85.3 kg)  10/04/21 186 lb 3.2 oz (84.5 kg)   Diabetic Foot Exam - Simple   No data filed    Lab Results  Component Value Date   WBC 7.4 09/27/2021   HGB 13.7 09/27/2021   HCT 43.1 09/27/2021   PLT 175.0 09/27/2021   GLUCOSE 133 (H) 10/02/2021   CHOL 161 09/27/2021   TRIG 90.0 09/27/2021   HDL 41.30 09/27/2021   LDLDIRECT 65.0 11/03/2018   LDLCALC 102 (H) 09/27/2021   ALT 13 10/02/2021   AST 19 10/02/2021   NA 138 10/02/2021   K 3.7 10/02/2021   CL 100 10/02/2021   CREATININE 1.78 (H) 10/02/2021   BUN 43 (H) 10/02/2021   CO2 25 10/02/2021   TSH 3.74 09/27/2021   PSA 1.18 10/07/2012   INR   1.0 08/15/2016   HGBA1C 6.7 (H) 09/27/2021   MICROALBUR 26.7 (H) 07/15/2016   Lab Results  Component Value Date   TSH 3.74 09/27/2021   Lab Results  Component Value Date   WBC 7.4 09/27/2021   HGB 13.7 09/27/2021   HCT 43.1 09/27/2021   MCV 85.0 09/27/2021   PLT 175.0 09/27/2021   Lab Results  Component Value Date   NA 138 10/02/2021   K 3.7 10/02/2021   CO2 25 10/02/2021   GLUCOSE 133 (H) 10/02/2021   BUN 43 (H) 10/02/2021   CREATININE 1.78 (H) 10/02/2021   BILITOT 0.9 10/02/2021   ALKPHOS 77 10/02/2021   AST 19 10/02/2021   ALT 13 10/02/2021   PROT 7.3 10/02/2021   ALBUMIN 3.8 10/02/2021   CALCIUM 9.9 10/02/2021   ANIONGAP 10 05/03/2021   EGFR 31 (L) 05/24/2021   GFR 34.19 (L)  10/02/2021   Lab Results  Component Value Date   CHOL 161 09/27/2021   Lab Results  Component Value Date   HDL 41.30 09/27/2021   Lab Results  Component Value Date   LDLCALC 102 (H) 09/27/2021   Lab Results  Component Value Date   TRIG 90.0 09/27/2021   Lab Results  Component Value Date   CHOLHDL 4 09/27/2021   Lab Results  Component Value Date   HGBA1C 6.7 (H) 09/27/2021      Assessment & Plan:   Problem List Items Addressed This Visit     Diabetes mellitus with chronic kidney disease ([place]) (Chronic)    hgba1c acceptable, minimize simple carbs. Increase exercise as tolerated. Continue current meds      Relevant Orders   CBC   TSH   Hemoglobin A1c   Microalbumin / creatinine urine ratio   Hyperlipidemia LDL goal <70 (Chronic)    Encourage heart healthy diet such as MIND or DASH diet, increase exercise, avoid trans fats, simple carbohydrates and processed foods, consider a krill or fish or flaxseed oil cap daily.       Relevant Orders   Comprehensive metabolic panel   Lipid panel   TSH   Vitamin D deficiency    Supplement and monitor      Relevant Orders   VITAMIN D 25 Hydroxy (Vit-D Deficiency, Fractures)   Chronic diastolic CHF (congestive heart failure) (HCC)    No recent exacerbation      Relevant Orders   CBC   Permanent atrial fibrillation (HCC)    Rate controlled, tolerating meds Patient declines vaccinations      Relevant Orders   CBC   TSH   No orders of the defined types were placed in this encounter.  I, [name], MD, personally preformed the services described in this documentation.  All medical record entries made by the scribe were at my direction and in my presence.  I have reviewed the chart and discharge instructions (if applicable) and agree that the record reflects my personal performance and is accurate and complete. 01/15/2022  I,Vincent Allen,acting as a scribe for [name], MD.,have documented all relevant  documentation on the behalf of [name], MD,as directed by  [name], MD while in the presence of [name], MD.  [name], MD

## 2022-01-15 NOTE — Assessment & Plan Note (Signed)
Rate controlled, tolerating meds Patient declines vaccinations

## 2022-01-15 NOTE — Assessment & Plan Note (Signed)
hgba1c acceptable, minimize simple carbs. Increase exercise as tolerated. Continue current meds 

## 2022-01-15 NOTE — Assessment & Plan Note (Signed)
No recent exacerbation 

## 2022-01-15 NOTE — Assessment & Plan Note (Signed)
Supplement and monitor 

## 2022-01-15 NOTE — Assessment & Plan Note (Signed)
Encourage heart healthy diet such as MIND or DASH diet, increase exercise, avoid trans fats, simple carbohydrates and processed foods, consider a krill or fish or flaxseed oil cap daily.  °

## 2022-01-16 ENCOUNTER — Other Ambulatory Visit: Payer: Self-pay

## 2022-01-16 DIAGNOSIS — E785 Hyperlipidemia, unspecified: Secondary | ICD-10-CM

## 2022-01-16 MED ORDER — POTASSIUM CHLORIDE 20 MEQ/15ML (10%) PO SOLN: 10.0000 meq | Freq: Every day | ORAL | Status: AC

## 2022-01-24 ENCOUNTER — Other Ambulatory Visit (INDEPENDENT_AMBULATORY_CARE_PROVIDER_SITE_OTHER): Payer: PPO

## 2022-01-24 DIAGNOSIS — E785 Hyperlipidemia, unspecified: Secondary | ICD-10-CM | POA: Diagnosis not present

## 2022-01-24 LAB — COMPREHENSIVE METABOLIC PANEL
ALT: 20 U/L (ref 0–53)
AST: 20 U/L (ref 0–37)
Albumin: 3.7 g/dL (ref 3.5–5.2)
Alkaline Phosphatase: 74 U/L (ref 39–117)
BUN: 45 mg/dL — ABNORMAL HIGH (ref 6–23)
CO2: 24 mEq/L (ref 19–32)
Calcium: 8.9 mg/dL (ref 8.4–10.5)
Chloride: 104 mEq/L (ref 96–112)
Creatinine, Ser: 1.97 mg/dL — ABNORMAL HIGH (ref 0.40–1.50)
GFR: 30.21 mL/min — ABNORMAL LOW (ref >60.00)
Glucose, Bld: 261 mg/dL — ABNORMAL HIGH (ref 70–99)
Potassium: 4.4 mEq/L (ref 3.5–5.1)
Sodium: 137 mEq/L (ref 135–145)
Total Bilirubin: 0.6 mg/dL (ref 0.2–1.2)
Total Protein: 6.5 g/dL (ref 6.0–8.3)

## 2022-01-24 NOTE — Addendum Note (Signed)
Addended by: Kelle Darting A on: 01/24/2022 09:20 AM   Modules accepted: Orders

## 2022-01-30 ENCOUNTER — Ambulatory Visit: Payer: PPO | Admitting: Pharmacist

## 2022-01-30 DIAGNOSIS — E0822 Diabetes mellitus due to underlying condition with diabetic chronic kidney disease: Secondary | ICD-10-CM

## 2022-01-30 DIAGNOSIS — E785 Hyperlipidemia, unspecified: Secondary | ICD-10-CM

## 2022-01-30 DIAGNOSIS — N183 Chronic kidney disease, stage 3 unspecified: Secondary | ICD-10-CM

## 2022-01-30 NOTE — Progress Notes (Signed)
Pharmacy Note  01/30/2022 Name: Vincent Allen MRN: [id number] DOB: 1933/06/13  Subjective: Vincent Allen is a 88 y.o. year old male who is a primary care patient of [name], MD. Clinical Pharmacist Practitioner referral was placed to assist with medication, diabetes and CHF management.    Engaged with patient and his son by telephone for follow up visit today.  Type 2 DM: Patient is taking glipizide '5mg'$  daily, Januvia '25mg'$  daily and Jardiance '10mg'$  daily. Last A1c increased slightly from 6.7% to 7.1%  Hypokalemia: serum potassium was low 01/15/2022 per notes patient was to start 15mq daily however per son they use potassium supplement they had on hand which was 20 mEq with directions on bottle to take 2 tablets = 40 mEq daily. Potassium was therapeutic 01/24/2022 at 4.4 when rechecked.  CKD - over the last year Scr had improved until labs 01/24/2022 showed increase in Scr to 1.94. Patient will follow up with nephrology 02/01/2022.  Hyperlipidemia - patient was started on ezetimibe by cardiologist 10/25/2021 but stopped about after a few weeks because patient felt is was causing numbness in his hands and fingers. He still is experiencing numbness in hands and fingers.  Medication Management: enrolled thru 03/17/2022 to get JCarvel Gettingand Eliquis from patient assistance program.   Recent Office Visits: 01/24/2022 - lab visit - potassium improved to 4.4 but Scr decreased. Advised to follow up with nephrology. 01/15/2022 - PCP (Dr BCharlett Blake Seen for follow up. Labs checked. Noted low potassium. Started potassium supplement 10 mEq daily. Recheck labs in 2 weeks.   Objective: Review of patient status, including review of consultants reports, laboratory and other test data, was performed as part of comprehensive evaluation and provision of chronic care management services.   Lab Results  Component Value Date   CREATININE 1.97 (H) 01/24/2022   CREATININE 1.65 (H) 01/15/2022    CREATININE 1.78 (H) 10/02/2021    Lab Results  Component Value Date   HGBA1C 7.1 (H) 01/15/2022       Component Value Date/Time   CHOL 149 01/15/2022 1146   TRIG 110.0 01/15/2022 1146   HDL 43.80 01/15/2022 1146   CHOLHDL 3 01/15/2022 1146   VLDL 22.0 01/15/2022 1146   LDLCALC 83 01/15/2022 1146   LDLCALC 101 (H) 12/16/2019 1543   LDLDIRECT 65.0 11/03/2018 1038     Clinical ASCVD: Yes  The ASCVD Risk score (Arnett DK, et al., 2019) failed to calculate for the following reasons:   The 2019 ASCVD risk score is only valid for ages 457to 774   BP Readings from Last 3 Encounters:  01/15/22 130/78  10/25/21 (!) 141/82  10/04/21 126/84     Allergies  Allergen Reactions   Atorvastatin     Myalgias / leg pain and weakness   Penicillins Hives and Itching    Has patient had a PCN reaction causing immediate rash, facial/tongue/throat swelling, SOB or lightheadedness with hypotension:Yes Has patient had a PCN reaction causing severe rash involving mucus membranes or skin necrosis:No Has patient had a PCN reaction that required hospitalization:No Has patient had a PCN reaction occurring within the last 10 years:No If all of the above answers are "NO", then may proceed with Cephalosporin use.    Verapamil     Junctional rhythm   Influenza Vaccines Rash    tinnitus    Medications Reviewed Today     Reviewed by ECherre Robins RPH-CPP (Pharmacist) on 01/30/22 at 1139  Med List Status: <None>  Medication Order Taking? Sig Documenting Provider Last Dose Status Informant  acetaminophen (TYLENOL) 500 MG tablet [id number] Yes Take 500 mg by mouth every 6 (six) hours as needed for moderate pain. [provider] Taking Active Child  ELIQUIS 2.5 MG TABS tablet [id number] Yes TAKE 1 TABLET BY MOUTH TWICE A DAY [name], MD Taking Active Child           Med Note Antony Contras, [place] Nov 21, 2021 10:58 AM) Approved 08/29 thru 03/17/2022 to get from medication assistance  program with [employer]  empagliflozin (JARDIANCE) 10 MG TABS tablet [id number] Yes Take 1 tablet (10 mg total) by mouth daily. [name], MD Taking Active            Med Note Antony Contras, [place] B   Wed Nov 21, 2021 10:59 AM) [place] 11/09/2021 thru 03/17/2022 to get thru medication assistance program with [employer]  ezetimibe (ZETIA) 10 MG tablet [id number] No Take 1 tablet (10 mg total) by mouth daily.  Patient not taking: Reported on 01/30/2022   Pixie Casino, MD Not Taking Active   furosemide (LASIX) 40 MG tablet [id number] Yes Take 0.5-1 tablets (20-40 mg total) by mouth daily. TAKE '40MG'$  TWICE DAILY FOR 5 DAYS, THEN '40MG'$  ONCE DAILY [name], MD Taking Active   glipiZIDE (GLUCOTROL) 5 MG tablet [id number] Yes Take 1 tablet (5 mg total) by mouth daily before breakfast. [name], MD Taking Active            Med Note Antony Contras, [place]   Wed Jan 30, 2022 11:36 AM)    glucose blood (ONE TOUCH ULTRA TEST) test strip [id number] Yes USE 1 STRIP 2 TIMES DAILY TO CHECK BLOOD SUGAR DX E08.22, N18.3 [name], MD Taking Active Child  Krill Oil 1000 MG CAPS [id number] Yes Take 1,000 mg by mouth daily. [provider] Taking Active Child  losartan (COZAAR) 25 MG tablet [id number] Yes Take 1 tablet (25 mg total) by mouth daily. Pixie Casino, MD Taking Active   Multiple Vitamin (MULTIVITAMIN WITH MINERALS) TABS tablet [id number] Yes Take 1 tablet by mouth daily. [provider] Taking Active Child  potassium chloride 20 MEQ/15ML (10%) solution 10 mEq [id number]    Patient taking differently: Take 10 mEq by mouth daily. 1 caps po daily   [name], MD  Consider Medication Status and Discontinue (Entry Error)   potassium chloride SA (KLOR-CON M) 20 MEQ tablet [id number] Yes Take 10 mEq by mouth daily. [provider] Taking Active            Med Note Antony Contras, [place] B   Wed Jan 30, 2022 11:39 AM) Patient had KCL 20 mEq at home from last hospitalization and started  taking according to directions on the bottle which were 2 tabs = 40 mEq daily.   sitaGLIPtin (JANUVIA) 25 MG tablet [id number] Yes Take 1 tablet (25 mg total) by mouth daily. [name], MD Taking Active            Med Note Antony Contras, [place]   Wed Nov 21, 2021 10:59 AM) Medication assistance program approved thru 03/17/2022  tamsulosin (FLOMAX) 0.4 MG CAPS capsule [id number] No Take 1 capsule (0.4 mg total) by mouth at bedtime.  Patient not taking: Reported on 01/30/2022   Pixie Casino, MD Not Taking Active Child           Med Note [facility], [place] May 29, 2021 10:36 AM)    Zinc 50 MG TABS [id number] Yes Take 50 mg by mouth daily. [provider] Taking Active Child            Patient Active Problem List   Diagnosis Date Noted   Permanent atrial fibrillation ([place]) 05/24/2021   Malnutrition of moderate degree 04/27/2021   Unilateral primary osteoarthritis, right knee 02/01/2021   Fatty liver 12/01/2020   Pedal edema 08/14/2020   Knee pain, bilateral 09/20/2019   Morbid obesity ([place]) 09/14/2019   Urinary frequency 11/06/2016   S/P placement of cardiac pacemaker 10/24/2016   Chronic diastolic CHF (congestive heart failure) ([place]) 10/24/2016   Cardiac device in situ    Debility 08/15/2016   Vitamin D deficiency 07/17/2015   Preventative health care 07/17/2015   Lumbago 12/02/2014   Pain of toe of left foot 03/14/2014   Essential hypertension 01/13/2013   Insomnia 06/27/2011   Osteopenia 09/02/2010   Family history of colon cancer 09/02/2010   Diabetes mellitus with chronic kidney disease ([place])    Hyperlipidemia LDL goal <70    CTS (carpal tunnel syndrome) 08/28/2010    Medication Assistance: approved for patient assistance for Januvia, Jardiance and Eliquis through 03/17/2022     Assessment / Plan: Type 2 DM: A1c increased but due to patient age and comorbidities A1c < 7.5% is acceptable.  Since Scr has increase - could increase Jardiance to max does of  '25mg'$  daily. Recommended to patient's son to discussed with nephrology 02/01/2022. Unfortunately he might not get more blood glucose lowering with decreased in Scr but higher dose could be more beneficial for renal and cardio protection.  Continue Jardiance '10mg'$  daily, Januvia '25mg'$  daily and glipizide '5mg'$  daily for now.   Hypokalemia: corrected but patient is taking higher dose that was recommended.  Advised patient and his son to lower dose of KCL 20 mEq to 1 tablet daily. Consider rechecking potassium when he sees nephrology 02/01/2022 or within the next 1 to 2 weeks.  Might be able to lower further in future.   CKD - over the last year Scr had shown improvement until labs 01/24/2022 showed increase in Scr to 1.94.  Continue with plan to follow up with nephrology 02/01/2022.  Could consider increasing Jardiance to '25mg'$   Hyperlipidemia -  Recommend restarting ezetimibe '10mg'$  daily.  Medication Management: enrolled thru 03/17/2022 to get Carvel Getting and Eliquis from patient assistance program. Will start process for 2024 after patient sees nephrology.   Follow Up:  Telephone follow up appointment with care management team member scheduled for:  1 month   Cherre Robins, PharmD Clinical Pharmacist [name] [phone]

## 2022-02-01 ENCOUNTER — Ambulatory Visit: Payer: PPO | Admitting: Podiatry

## 2022-02-01 DIAGNOSIS — I129 Hypertensive chronic kidney disease with stage 1 through stage 4 chronic kidney disease, or unspecified chronic kidney disease: Secondary | ICD-10-CM | POA: Diagnosis not present

## 2022-02-01 DIAGNOSIS — E1122 Type 2 diabetes mellitus with diabetic chronic kidney disease: Secondary | ICD-10-CM | POA: Diagnosis not present

## 2022-02-01 DIAGNOSIS — M542 Cervicalgia: Secondary | ICD-10-CM | POA: Diagnosis not present

## 2022-02-01 DIAGNOSIS — I779 Disorder of arteries and arterioles, unspecified: Secondary | ICD-10-CM | POA: Diagnosis not present

## 2022-02-01 DIAGNOSIS — N1832 Chronic kidney disease, stage 3b: Secondary | ICD-10-CM | POA: Diagnosis not present

## 2022-02-01 DIAGNOSIS — I4891 Unspecified atrial fibrillation: Secondary | ICD-10-CM | POA: Diagnosis not present

## 2022-02-20 ENCOUNTER — Ambulatory Visit (INDEPENDENT_AMBULATORY_CARE_PROVIDER_SITE_OTHER): Payer: PPO

## 2022-02-20 VITALS — Wt 189.0 lb

## 2022-02-20 DIAGNOSIS — Z0000 Encounter for general adult medical examination without abnormal findings: Secondary | ICD-10-CM

## 2022-02-20 NOTE — Patient Instructions (Addendum)
Vincent Allen , Thank you for taking time to come for your Medicare Wellness Visit. I appreciate your ongoing commitment to your health goals. Please review the following plan we discussed and let me know if I can assist you in the future.   These are the goals we discussed:  Goals      Chronic Care Management Pharmacy Care Plan     CARE PLAN ENTRY    Hypertension / High Blood pressure BP Readings from Last 3 Encounters:  10/25/21 (!) 141/82  10/04/21 126/84  10/02/21 124/88  Pharmacist Clinical Goal(s): Over the next 90 days, patient will work with PharmD and providers to maintain BP goal <130/80 Current regimen:  Metoprolol succinate '25mg'$  daily Losartan '25mg'$  daily  Interventions: Continue current hypertension medication regimen.  Reviewed blood pressure goal Recommend check blood pressure 2 to 3 times per week and record  Congestive Heart Disease:  Goal: prevention of worsening of heart disease and minimize symptoms of congestive heart diseaes Current Regimen:  Furosemide '40mg'$  - take 1 or 2 tablets  each morning  Metoprolol succinate ER '25mg'$  once a day  Jardiance '10mg'$  daily    Losartan '25mg'$  daily  Interventions:  Discussed signs and symptoms of CHF exacerbation - weight gain, SOB, abdominal fullness, swelling in legs or abdomen, Fatigue and weakness, changes in ability to perform usual activities, persistent cough or wheezing with white or pink blood-tinged mucus, nausea and lack of appetite Continue to weigh daily - report weight gain of more than 3 lbs in 24 hours or 5 lbs in 1 week.  Hyperlipidemia Lab Results  Component Value Date/Time   LDLCALC 102 (H) 09/27/2021 09:12 AM   LDLCALC 101 (H) 12/16/2019 03:43 PM   LDLDIRECT 65.0 11/03/2018 10:38 AM  Pharmacist Clinical Goal(s): Over the next 90 days, patient will work with PharmD and providers to achieve LDL goal < 70 Current regimen:  Diet and exercise management   Krill Oil '1000mg'$  daily Interventions: Discussed  past experiences with atorvastatin - caused leg pain and weakness Recommend limiting intake of food high in saturated fat / avoid fried foods; increase intake of vegetables Patient self care activities - Over the next 90 days, patient will: Achieve LDL < 70 Continue Krill Oil   Diabetes Lab Results  Component Value Date/Time   HGBA1C 6.7 (H) 09/27/2021 09:12 AM   HGBA1C 5.8 04/02/2021 02:56 PM  Pharmacist Clinical Goal(s): Over the next 90 days, patient will work with PharmD and providers to maintain A1c goal  <7.5% Current regimen:  Januvia '25mg'$  daily Glipizide '10mg'$  - take 0.5 talbet = '5mg'$  daily Jardiance '10mg'$  daily Interventions: Reviewed medication dose and renal funciton  Recommended checking blood glucose / sugar daily Reviewed home blood glucose readings and reviewed goals  Fasting blood glucose goal (before meals) = 80 to 130 Blood glucose goal after a meal = less than 180  Patient self care activities - Over the next 90 days, patient will:   Continue to check blood glucose / sugar 1 to 2 times per day. Notify Dr Frederik Pear office if experience blood glucose < 80 Your next prescription for glipizide will be for '5mg'$  dose - you will take 1 tablet with morning meal How to Treat a Low Glucose Level:  If you have a low blood glucose less than 70, please eat / drink 15 grams of carbohydrates (4 oz of juice, soda, 4 glucose tablets, or 3-4 pieces of hard candy).  It is best so choose a "quick" source of sugar  that does not contain fat (chocolate and peanut butter might take longer to increase your blood glucose) Wait 15 minutes and then recheck your blood glucose. If your blood glucose is still less than 70, eat another 15 grams of carbohydrates.  Wait another 15 minutes and recheck your glucose.  Continue this until your blood glucose is over 70. Once you blood glucose is over 70, eat a snack with protein in it to prevent your blood glucose from dropping again. Reviewed home blood  glucose readings and reviewed goals  Fasting blood glucose goal (before meals) = 80 to 130 Blood glucose goal after a meal = less than 180   Atrial fibrillation  Pharmacist Clinical Goal(s) Over the next 90 days, patient will work with PharmD and providers to reduce complications associated with atrial fibrillation Current regimen:  Eliquis 2.'5mg'$  twice daily in morning and with dinner Metoprolol succinate '25mg'$  daily Interventions: Maintain current atrial fibrillation medication regimen and plan to follow up with Dr Caryl Comes regarding pacemaker  Decreased Kidney Function:  Pharmacist Clinical Goal(s) Over the next 90 days, patient will work with PharmD and providers to slow / prevent further decrease in kidney function Current regimen:  Jardiance '10mg'$  daily Interventions: Reviewed medications for adjustment based on kidney funciton Patient self care activities - Over the next 90 days, patient will: Avoid over the counter pain relievers like Aleve / naproxen; Motrin / ibuprofen. If you need anything for pain relief, headaches or fever - Tylenol / acetaminophen '500mg'$  up to 2 tablets every 8 hours is safer option.  Follow up with [facility]     Medication management Pharmacist Clinical Goal(s): Over the next 90 days, patient will work with PharmD and providers to achieve optimal medication adherence Current pharmacy: [facility] Inter[facility] Comprehensive medication review performed. Continue current medication management strategy Assisted patient in applying for LIS at last visit; confirmed with his son today, 09/07/2021 that patient was denied LIS / Extra Help. He is looking for LIS letter and will bring letter by office as [place] program for Jardiance patient assistance program is requiring proof of LIS denial. Patient self care activities - Over the next 90 days, patient will: Focus on medication adherence by filling and taking medications appropriately  Take medications as  prescribed Report any questions or concerns to PharmD and/or provider(s)   Patient Goals:  Continue current medications  Monitor for signs and symptoms of heart failure / fluid retention - weight gain, SOB, abdominal fullness, swelling in legs or abdomen, Fatigue and weakness, changes in ability to perform usual activities, persistent cough or wheezing with white or pink blood-tinged mucus, nausea and lack of appetite Continue to weigh daily - report weight gain of more than 3 lbs in 24 hours or 5 lbs in 1 week.    Please see past updates related to this goal by clicking on the "Past Updates" button in the selected goal       Maintain health and independence     Patient Stated     Patient would like to move more.      Patient Stated     Get rid of numbness        This is a list of the screening recommended for you and due dates:  Health Maintenance  Topic Date Due   Zoster (Shingles) Vaccine (1 of 2) 05/22/2022*   Flu Shot  06/16/2022*   Complete foot exam   02/26/2022   Hemoglobin A1C  07/16/2022   Eye exam  for diabetics  08/09/2022   Medicare Annual Wellness Visit  02/21/2023   DTaP/Tdap/Td vaccine (2 - Td or Tdap) 05/18/2024   Pneumonia Vaccine  Completed   HPV Vaccine  Aged Out   COVID-19 Vaccine  Discontinued  *Topic was postponed. The date shown is not the original due date.    Advanced directives: Please bring a copy of your health care power of attorney and living will to the office at your convenience.  Conditions/risks identified: Get rid numbness  Next appointment: Follow up in one year for your annual wellness visit.   Preventive Care 41 Years and Older, Male  Preventive care refers to lifestyle choices and visits with your health care provider that can promote health and wellness. What does preventive care include? A yearly physical exam. This is also called an annual well check. Dental exams once or twice a year. Routine eye exams. Ask your health  care provider how often you should have your eyes checked. Personal lifestyle choices, including: Daily care of your teeth and gums. Regular physical activity. Eating a healthy diet. Avoiding tobacco and drug use. Limiting alcohol use. Practicing safe sex. Taking low doses of aspirin every day. Taking vitamin and mineral supplements as recommended by your health care provider. What happens during an annual well check? The services and screenings done by your health care provider during your annual well check will depend on your age, overall health, lifestyle risk factors, and family history of disease. Counseling  Your health care provider may ask you questions about your: Alcohol use. Tobacco use. Drug use. Emotional well-being. Home and relationship well-being. Sexual activity. Eating habits. History of falls. Memory and ability to understand (cognition). Work and work Statistician. Screening  You may have the following tests or measurements: Height, weight, and BMI. Blood pressure. Lipid and cholesterol levels. These may be checked every 5 years, or more frequently if you are over 42 years old. Skin check. Lung cancer screening. You may have this screening every year starting at age 68 if you have a 30-pack-year history of smoking and currently smoke or have quit within the past 15 years. Fecal occult blood test (FOBT) of the stool. You may have this test every year starting at age 81. Flexible sigmoidoscopy or colonoscopy. You may have a sigmoidoscopy every 5 years or a colonoscopy every 10 years starting at age 56. Prostate cancer screening. Recommendations will vary depending on your family history and other risks. Hepatitis C blood test. Hepatitis B blood test. Sexually transmitted disease (STD) testing. Diabetes screening. This is done by checking your blood sugar (glucose) after you have not eaten for a while (fasting). You may have this done every 1-3 years. Abdominal  aortic aneurysm (AAA) screening. You may need this if you are a current or former smoker. Osteoporosis. You may be screened starting at age 8 if you are at high risk. Talk with your health care provider about your test results, treatment options, and if necessary, the need for more tests. Vaccines  Your health care provider may recommend certain vaccines, such as: Influenza vaccine. This is recommended every year. Tetanus, diphtheria, and acellular pertussis (Tdap, Td) vaccine. You may need a Td booster every 10 years. Zoster vaccine. You may need this after age 32. Pneumococcal 13-valent conjugate (PCV13) vaccine. One dose is recommended after age 22. Pneumococcal polysaccharide (PPSV23) vaccine. One dose is recommended after age 19. Talk to your health care provider about which screenings and vaccines you need and how often you  need them. This information is not intended to replace advice given to you by your health care provider. Make sure you discuss any questions you have with your health care provider. Document Released: 03/31/2015 Document Revised: 11/22/2015 Document Reviewed: 01/03/2015 Elsevier Interactive Patient Education  2017 [employer] Prevention in the Home Falls can cause injuries. They can happen to people of all ages. There are many things you can do to make your home safe and to help prevent falls. What can I do on the outside of my home? Regularly fix the edges of walkways and driveways and fix any cracks. Remove anything that might make you trip as you walk through a door, such as a raised step or threshold. Trim any bushes or trees on the path to your home. Use bright outdoor lighting. Clear any walking paths of anything that might make someone trip, such as rocks or tools. Regularly check to see if handrails are loose or broken. Make sure that both sides of any steps have handrails. Any raised decks and porches should have guardrails on the edges. Have any  leaves, snow, or ice cleared regularly. Use sand or salt on walking paths during winter. Clean up any spills in your garage right away. This includes oil or grease spills. What can I do in the bathroom? Use night lights. Install grab bars by the toilet and in the tub and shower. Do not use towel bars as grab bars. Use non-skid mats or decals in the tub or shower. If you need to sit down in the shower, use a plastic, non-slip stool. Keep the floor dry. Clean up any water that spills on the floor as soon as it happens. Remove soap buildup in the tub or shower regularly. Attach bath mats securely with double-sided non-slip rug tape. Do not have throw rugs and other things on the floor that can make you trip. What can I do in the bedroom? Use night lights. Make sure that you have a light by your bed that is easy to reach. Do not use any sheets or blankets that are too big for your bed. They should not hang down onto the floor. Have a firm chair that has side arms. You can use this for support while you get dressed. Do not have throw rugs and other things on the floor that can make you trip. What can I do in the kitchen? Clean up any spills right away. Avoid walking on wet floors. Keep items that you use a lot in easy-to-reach places. If you need to reach something above you, use a strong step stool that has a grab bar. Keep electrical cords out of the way. Do not use floor polish or wax that makes floors slippery. If you must use wax, use non-skid floor wax. Do not have throw rugs and other things on the floor that can make you trip. What can I do with my stairs? Do not leave any items on the stairs. Make sure that there are handrails on both sides of the stairs and use them. Fix handrails that are broken or loose. Make sure that handrails are as long as the stairways. Check any carpeting to make sure that it is firmly attached to the stairs. Fix any carpet that is loose or worn. Avoid  having throw rugs at the top or bottom of the stairs. If you do have throw rugs, attach them to the floor with carpet tape. Make sure that you have a light switch  at the top of the stairs and the bottom of the stairs. If you do not have them, ask someone to add them for you. What else can I do to help prevent falls? Wear shoes that: Do not have high heels. Have rubber bottoms. Are comfortable and fit you well. Are closed at the toe. Do not wear sandals. If you use a stepladder: Make sure that it is fully opened. Do not climb a closed stepladder. Make sure that both sides of the stepladder are locked into place. Ask someone to hold it for you, if possible. Clearly mark and make sure that you can see: Any grab bars or handrails. First and last steps. Where the edge of each step is. Use tools that help you move around (mobility aids) if they are needed. These include: Canes. Walkers. Scooters. Crutches. Turn on the lights when you go into a dark area. Replace any light bulbs as soon as they burn out. Set up your furniture so you have a clear path. Avoid moving your furniture around. If any of your floors are uneven, fix them. If there are any pets around you, be aware of where they are. Review your medicines with your doctor. Some medicines can make you feel dizzy. This can increase your chance of falling. Ask your doctor what other things that you can do to help prevent falls. This information is not intended to replace advice given to you by your health care provider. Make sure you discuss any questions you have with your health care provider. Document Released: 12/29/2008 Document Revised: 08/10/2015 Document Reviewed: 04/08/2014 Elsevier Interactive Patient Education  2017 [employer].

## 2022-02-20 NOTE — Progress Notes (Signed)
I connected with  [name] on 02/20/22 by a audio enabled telemedicine application and verified that I am speaking with the correct person using two identifiers. With son Shanon Brow   Patient Location: Home  Provider Location: Office/Clinic  I discussed the limitations of evaluation and management by telemedicine. The patient expressed understanding and agreed to proceed.   Subjective:   LONNY EISEN is a 88 y.o. male who presents for Medicare Annual/Subsequent preventive examination.  Review of Systems     Cardiac Risk Factors include: advanced age (>30mn, >>53women)     Objective:    Today's Vitals   02/20/22 1120  Weight: 189 lb (85.7 kg)   Body mass index is 25.63 kg/m.     02/20/2022   11:28 AM 06/22/2021    7:44 AM 04/20/2021    5:24 PM 01/01/2021    1:12 PM 10/06/2019    1:51 PM 11/12/2016    2:56 PM 10/05/2016   12:50 PM  Advanced Directives  Does Patient Have a Medical Advance Directive? Yes No Yes Yes Yes Yes No  Type of [place]of [place] will  Healthcare Power of [place] will [place] will   Does patient want to make changes to medical advance directive?   No - Patient declined Yes (MAU/Ambulatory/Procedural Areas - Information given) No - Patient declined    Copy of H[place] Chart? No - copy requested   No - copy requested No - copy requested    Would patient like information on creating a medical advance directive?  No - Patient declined         Current Medications (verified) Outpatient Encounter Medications as of 02/20/2022  Medication Sig   acetaminophen (TYLENOL) 500 MG tablet Take 500 mg by mouth every 6 (six) hours as needed for moderate pain.   b complex vitamins capsule Take 1 capsule by mouth daily.   ELIQUIS 2.5 MG TABS tablet TAKE 1 TABLET BY MOUTH TWICE A DAY   empagliflozin  (JARDIANCE) 10 MG TABS tablet Take 1 tablet (10 mg total) by mouth daily. (Patient taking differently: Take 25 mg by mouth daily.)   ezetimibe (ZETIA) 10 MG tablet Take 1 tablet (10 mg total) by mouth daily.   furosemide (LASIX) 40 MG tablet Take 0.5-1 tablets (20-40 mg total) by mouth daily. TAKE '40MG'$  TWICE DAILY FOR 5 DAYS, THEN '40MG'$  ONCE DAILY   glipiZIDE (GLUCOTROL) 5 MG tablet Take 1 tablet (5 mg total) by mouth daily before breakfast.   glucose blood (ONE TOUCH ULTRA TEST) test strip USE 1 STRIP 2 TIMES DAILY TO CHECK BLOOD SUGAR DX E08.22, N18.3   Krill Oil 1000 MG CAPS Take 1,000 mg by mouth daily.   losartan (COZAAR) 25 MG tablet Take 1 tablet (25 mg total) by mouth daily.   Multiple Vitamin (MULTIVITAMIN WITH MINERALS) TABS tablet Take 1 tablet by mouth daily.   potassium chloride SA (KLOR-CON M) 20 MEQ tablet Take 10 mEq by mouth daily.   sitaGLIPtin (JANUVIA) 25 MG tablet Take 1 tablet (25 mg total) by mouth daily.   tamsulosin (FLOMAX) 0.4 MG CAPS capsule Take 1 capsule (0.4 mg total) by mouth at bedtime.   Zinc 50 MG TABS Take 50 mg by mouth daily.   No facility-administered encounter medications on file as of 02/20/2022.    Allergies (verified) Atorvastatin, Penicillins, Verapamil, and Influenza vaccines   History: Past Medical History:  Diagnosis Date   Arthritis    Bradycardia    Cataract    Chronic kidney disease stage III (GFR 30-59 ml/min) 05/03/2012   Colon polyps    Complication of anesthesia    emesis   Diabetes mellitus    GERD (gastroesophageal reflux disease)    HOH (hard of hearing)    HTN (hypertension) 01/13/2013   Hyperlipidemia    Hypertension    Pacemaker 12/2016   Persistent atrial fibrillation (HCC)    Presence of permanent cardiac pacemaker 09/20/2016   Symptomatic bradycardia    Vitamin D deficiency 07/17/2015   Past Surgical History:  Procedure Laterality Date   ANKLE ARTHROPLASTY     BIV PACEMAKER INSERTION CRT-P N/A 06/22/2021   Procedure:  UPGRADE TO BIV PACEMAKER INSERTION CRT-P;  Surgeon: Deboraha Sprang, MD;  Location: [facility];  Service: Cardiovascular;  Laterality: N/A;   CARDIOVERSION N/A 08/20/2016   Procedure: CARDIOVERSION;  Surgeon: Sanda Klein, MD;  Location: [facility];  Service: Cardiovascular;  Laterality: N/A;   CATARACT EXTRACTION  2010, 2014   CATARACT EXTRACTION  02/28/14   CHOLECYSTECTOMY     CHOLECYSTECTOMY N/A 05/03/2012   Procedure: LAPAROSCOPIC CHOLECYSTECTOMY WITH INTRAOPERATIVE CHOLANGIOGRAM;  Surgeon: [name], MD;  Location: [facility];  Service: General;  Laterality: N/A;   INSERT / REPLACE / REMOVE PACEMAKER  09/20/2016   PACEMAKER IMPLANT N/A 09/20/2016   Procedure: Pacemaker Implant;  Surgeon: Deboraha Sprang, MD;  Location: [facility];  Service: Cardiovascular;  Laterality: N/A;   ROTATOR CUFF REPAIR     SHOULDER SURGERY     TONSILLECTOMY     Family History  Problem Relation Age of Onset   Cancer Mother        colon, breast, pancreas, skin cancer   Heart disease Father        heart valve replaced   Stroke Father    Diabetes Father    Cancer Paternal Grandmother        colon   Obesity Son    Heart disease Son        bradycardia   Social History   Socioeconomic History   Marital status: Widowed    Spouse name: Not on file   Number of children: Not on file   Years of education: Not on file   Highest education level: Not on file  Occupational History   Not on file  Tobacco Use   Smoking status: Never   Smokeless tobacco: Never  Vaping Use   Vaping Use: Never used  Substance and Sexual Activity   Alcohol use: Yes    Alcohol/week: 1.0 standard drink of alcohol    Types: 1 Glasses of wine per week   Drug use: No   Sexual activity: Not on file    Comment: lives alone , no major dietary restrictions, 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.  Other Topics Concern   Not on file  Social History Narrative   Not on file   Social Determinants  of Health   Financial Resource Strain: Low Risk  (02/20/2022)   Overall Financial Resource Strain (CARDIA)    Difficulty of Paying Living Expenses: Not very hard  Food Insecurity: No Food Insecurity (02/20/2022)   Hunger Vital Sign    Worried About Running Out of Food in the Last Year: Never true    Ran Out of Food in the Last Year: Never true  Transportation Needs: No Transportation Needs (02/20/2022)  PRAPARE - Hydrologist (Medical): No    Lack of Transportation (Non-Medical): No  Physical Activity: Insufficiently Active (02/20/2022)   Exercise Vital Sign    Days of Exercise per Week: 7 days    Minutes of Exercise per Session: 10 min  Stress: No Stress Concern Present (02/20/2022)   [place]    Feeling of Stress : Not at all  Social Connections: Socially Isolated (02/20/2022)   Social Connection and Isolation Panel [NHANES]    Frequency of Communication with Friends and Family: More than three times a week    Frequency of Social Gatherings with Friends and Family: Three times a week    Attends Religious Services: Never    Active Member of Clubs or Organizations: No    Attends Archivist Meetings: Never    Marital Status: Widowed    Tobacco Counseling Counseling given: Not Answered   Clinical Intake:  Pre-visit preparation completed: Yes  Pain : No/denies pain     Nutritional Risks: None Diabetes: Yes CBG done?: Yes (101 per pt) CBG resulted in Enter/ Edit results?: No Did pt. bring in CBG monitor from home?: No  How often do you need to have someone help you when you read instructions, pamphlets, or other written materials from your doctor or pharmacy?: 1 - Never  Diabetic?Nutrition Risk Assessment:  Has the patient had any N/V/D within the last 2 months?  No  Does the patient have any non-healing wounds?  No  Has the patient had any unintentional weight loss  or weight gain?  No   Diabetes:  Is the patient diabetic?  Yes  If diabetic, was a CBG obtained today?  Yes  Did the patient bring in their glucometer from home?  No  How often do you monitor your CBG's? Daily .   Financial Strains and Diabetes Management:  Are you having any financial strains with the device, your supplies or your medication? No .  Does the patient want to be seen by Chronic Care Management for management of their diabetes?  No  Would the patient like to be referred to a Nutritionist or for Diabetic Management?  No   Diabetic Exams:  Diabetic Eye Exam: Completed 08/08/21 Diabetic Foot Exam: Completed 02/26/21   Interpreter Needed?: No  Information entered by :: Charlott Rakes, LPN   Activities of Daily Living    02/20/2022   11:29 AM 06/22/2021    7:43 AM  In your present state of health, do you have any difficulty performing the following activities:  Hearing? 1 0  Comment right ear HOH   Vision? 0 1  Difficulty concentrating or making decisions? 0 1  Walking or climbing stairs? 0 1  Dressing or bathing? 0 0  Doing errands, shopping? 0   Preparing Food and eating ? N   Using the Toilet? N   In the past six months, have you accidently leaked urine? N   Do you have problems with loss of bowel control? N   Managing your Medications? N   Managing your Finances? N   Housekeeping or managing your Housekeeping? N     Patient Care Team: [name], MD as PCP - General (Family Medicine) Debara Pickett Nadean Corwin, MD as PCP - Cardiology (Cardiology) Deboraha Sprang, MD as PCP - Electrophysiology (Cardiology) Clent Jacks, MD as Consulting Physician (Ophthalmology) Danella Sensing, MD as Consulting Physician (Dermatology) Dorothy Spark, MD as Consulting  Physician (Cardiology) [name], MD as Consulting Physician (Orthopedic Surgery) Regal, Tamala Fothergill, DPM as Consulting Physician (Podiatry) Cherre Robins, RPH-CPP (Pharmacist)  Indicate any recent  Medical Services you may have received from other than [facility] providers in the past year (date may be approximate).     Assessment:   This is a routine wellness examination for [place].  Hearing/Vision screen Hearing Screening - Comments:: Pt stated HOH right ear  Vision Screening - Comments:: Pt follows up with Dr Katy Fitch for annual eye exams   Dietary issues and exercise activities discussed: Current Exercise Habits: Home exercise routine, Type of exercise: walking;strength training/weights, Time (Minutes): 10, Frequency (Times/Week): 7, Weekly Exercise (Minutes/Week): 70   Goals Addressed             This Visit's Progress    Patient Stated       Get rid of numbness       Depression Screen    02/20/2022   11:26 AM 01/15/2022   11:21 AM 10/02/2021   11:15 AM 06/19/2021   11:44 AM 01/01/2021    1:17 PM 11/30/2020    3:04 PM 10/06/2019    1:53 PM  PHQ 2/9 Scores  PHQ - 2 Score 0 0 0 0 0 0 0  PHQ- 9 Score 0 0         Fall Risk    02/20/2022   11:29 AM 01/15/2022   11:21 AM 10/02/2021   11:15 AM 06/19/2021   11:43 AM 01/01/2021    1:15 PM  Fall Risk   Falls in the past year? 0 0 0 0 0  Number falls in past yr: 0 0 0 0 0  Injury with Fall? 0 0 0 0 0  Risk for fall due to : Impaired vision  No Fall Risks Impaired balance/gait;Impaired mobility No Fall Risks  Follow up Falls prevention discussed Falls evaluation completed Falls evaluation completed Falls evaluation completed Falls prevention discussed    FALL RISK PREVENTION PERTAINING TO THE HOME:  Any stairs in or around the home? No  If so, are there any without handrails? No  Home free of loose throw rugs in walkways, pet beds, electrical cords, etc? Yes  Adequate lighting in your home to reduce risk of falls? Yes   ASSISTIVE DEVICES UTILIZED TO PREVENT FALLS:  Life alert? No  Use of a cane, walker or w/c? No  Grab bars in the bathroom? Yes  Shower chair or bench in shower? Yes  Elevated toilet seat or a  handicapped toilet? Yes   TIMED UP AND GO:  Was the test performed? No .   Cognitive Function:        Immunizations Immunization History  Administered Date(s) Administered   Fluad Quad(high Dose 65+) 01/16/2021   Pneumococcal Conjugate-13 03/23/2015   Pneumococcal Polysaccharide-23 06/27/2011   Tdap 05/19/2014    TDAP status: Up to date  Flu Vaccine status: Declined, Education has been provided regarding the importance of this vaccine but patient still declined. Advised may receive this vaccine at local pharmacy or Health Dept. Aware to provide a copy of the vaccination record if obtained from local pharmacy or Health Dept. Verbalized acceptance and understanding.  Pneumococcal vaccine status: Up to date  Covid-19 vaccine status: Declined, Education has been provided regarding the importance of this vaccine but patient still declined. Advised may receive this vaccine at local pharmacy or Health Dept.or vaccine clinic. Aware to provide a copy of the vaccination record if obtained from local pharmacy or  Health Dept. Verbalized acceptance and understanding.  Qualifies for Shingles Vaccine? Yes   Zostavax completed No   Shingrix Completed?: No.    Education has been provided regarding the importance of this vaccine. Patient has been advised to call insurance company to determine out of pocket expense if they have not yet received this vaccine. Advised may also receive vaccine at local pharmacy or Health Dept. Verbalized acceptance and understanding.  Screening Tests Health Maintenance  Topic Date Due   Zoster Vaccines- Shingrix (1 of 2) 05/22/2022 (Originally 07/26/1952)   INFLUENZA VACCINE  06/16/2022 (Originally 10/16/2021)   FOOT EXAM  02/26/2022   HEMOGLOBIN A1C  07/16/2022   OPHTHALMOLOGY EXAM  08/09/2022   Medicare Annual Wellness (AWV)  02/21/2023   DTaP/Tdap/Td (2 - Td or Tdap) 05/18/2024   Pneumonia Vaccine 31+ Years old  Completed   HPV VACCINES  Aged Out   COVID-19  Vaccine  Discontinued    Health Maintenance  There are no preventive care reminders to display for this patient.   Colorectal cancer screening: No longer required.    Additional Screening:   Vision Screening: Recommended annual ophthalmology exams for early detection of glaucoma and other disorders of the eye. Is the patient up to date with their annual eye exam?  Yes  Who is the provider or what is the name of the office in which the patient attends annual eye exams? Dr Katy Fitch  If pt is not established with a provider, would they like to be referred to a provider to establish care? No .   Dental Screening: Recommended annual dental exams for proper oral hygiene  Community Resource Referral / Chronic Care Management: CRR required this visit?  No   CCM required this visit?  No      Plan:     I have personally reviewed and noted the following in the patient's chart:   Medical and social history Use of alcohol, tobacco or illicit drugs  Current medications and supplements including opioid prescriptions. Patient is not currently taking opioid prescriptions. Functional ability and status Nutritional status Physical activity Advanced directives List of other physicians Hospitalizations, surgeries, and ER visits in previous 12 months Vitals Screenings to include cognitive, depression, and falls Referrals and appointments  In addition, I have reviewed and discussed with patient certain preventive protocols, quality metrics, and best practice recommendations. A written personalized care plan for preventive services as well as general preventive health recommendations were provided to patient.     Willette Brace, LPN   [id number]   Nurse Notes: none

## 2022-02-22 ENCOUNTER — Ambulatory Visit: Payer: PPO | Admitting: Podiatry

## 2022-02-22 ENCOUNTER — Encounter: Payer: Self-pay | Admitting: Podiatry

## 2022-02-22 VITALS — BP 168/90

## 2022-02-22 DIAGNOSIS — B351 Tinea unguium: Secondary | ICD-10-CM | POA: Diagnosis not present

## 2022-02-22 DIAGNOSIS — M79675 Pain in left toe(s): Secondary | ICD-10-CM | POA: Diagnosis not present

## 2022-02-22 DIAGNOSIS — M79674 Pain in right toe(s): Secondary | ICD-10-CM | POA: Diagnosis not present

## 2022-02-22 DIAGNOSIS — N183 Chronic kidney disease, stage 3 unspecified: Secondary | ICD-10-CM | POA: Diagnosis not present

## 2022-02-22 DIAGNOSIS — E0822 Diabetes mellitus due to underlying condition with diabetic chronic kidney disease: Secondary | ICD-10-CM

## 2022-02-22 NOTE — Progress Notes (Signed)
This patient returns to my office for at risk foot care.  This patient requires this care by a professional since this patient will be at risk due to having diabetes and coagulation defect.  This patient is unable to cut nails himself since the patient cannot reach his nails.These nails are painful walking and wearing shoes.  This patient presents for at risk foot care today.  General Appearance  Alert, conversant and in no acute stress.  Vascular  Dorsalis pedis and posterior tibial  pulses are weakly  palpable  bilaterally.  Capillary return is within normal limits  bilaterally. Temperature is within normal limits  bilaterally. Venous stasis  B/L.  Neurologic  Senn-Weinstein monofilament wire test within normal limits  bilaterally. Muscle power within normal limits bilaterally.  Nails Thick disfigured discolored nails with subungual debris  from hallux to fifth toes bilaterally. No evidence of bacterial infection or drainage bilaterally.  Orthopedic  No limitations of motion  feet .  No crepitus or effusions noted.  No bony pathology or digital deformities noted.  Skin  normotropic skin with no porokeratosis noted bilaterally.  No signs of infections or ulcers noted.     Onychomycosis  Pain in right toes  Pain in left toes  Consent was obtained for treatment procedures.   Mechanical debridement of nails 1-5  bilaterally performed with a nail nipper.  Filed with dremel without incident.    Return office visit   3 months                   Told patient to return for periodic foot care and evaluation due to potential at risk complications.   Gardiner Barefoot DPM

## 2022-03-05 ENCOUNTER — Ambulatory Visit: Payer: PPO | Admitting: Pharmacist

## 2022-03-05 DIAGNOSIS — E785 Hyperlipidemia, unspecified: Secondary | ICD-10-CM

## 2022-03-05 DIAGNOSIS — N183 Chronic kidney disease, stage 3 unspecified: Secondary | ICD-10-CM

## 2022-03-05 DIAGNOSIS — I5032 Chronic diastolic (congestive) heart failure: Secondary | ICD-10-CM

## 2022-03-05 NOTE — Progress Notes (Signed)
Pharmacy Note  03/05/2022 Name: Vincent Allen MRN: [id number] DOB: [birth year]  Subjective: Vincent Allen is a 88 y.o. year old male who is a primary care patient of [name], MD. Clinical Pharmacist Practitioner referral was placed to assist with medication, diabetes and CHF management.    Engaged with patient and his son by telephone for follow up visit today.  Type 2 DM: Patient is taking glipizide 8m daily, Januvia 214mdaily and Jardiance 2535maily. Dose of Jardiance was increased from 10 to 75m39mily by nephrologist 02/01/2022. Last A1c increased slightly from 6.7% to 7.1% ; Home blood glucose has been 105, 140, 125 140, 112. Denies symptoms of hypoglycemia.  CHF: Patient checks weight daily. Ranges between 182 and 184lbs. Denies shortness of breath or edema.  Hypokalemia: serum potassium was WNL 02/01/2022. Patient is taking potassium 20mE69m0.5 tablet = 10 mEq daily.   CKD - over the last year Scr has been stable. Patient was seen at [facility] 02/01/2022 by Dr UptonHollie Salk increased Jardiance from 10mg 55my to 75mg d85m. Scr was 1.66 on 02/01/2022 and eGFR was 40. Blood glucose 163 and potassium 3.7.  Hyperlipidemia - Has restarted on ezetimibe 10mg da30m Will see Dr Hilty 12Debara Pickett023. Patient has stopped ezetimibe because he thought might have been cause of numbness and tingling in hands. Did not improve when he stopped ezetimibe. Patient was instructed to see ortho by Dr Upton asHollie Salksuspects numbness and tingling might be related to pinched nerve.  Medication Management: enrolled thru 03/17/2022 to get JardiancCarvel Gettingquis from patient assistance program.  Son endorses that he received [id number] that were mailed.  Patient will need updated Rx for Jardiance 75mg sen67m medication assistance program program pharmacy. Also will need Januvia refilled.  He endorses having 2 or 3 months of Eliquis - last delivery was 02/01/2022 for 90 day supply.     Recent Office Visits: 02/01/2022 - Nephrology (Dr Upton). NHollie Salkthat renal function was stable. Protein was elevated. Increased Jardiance from 10mg dail18m 75mg daily68mU 4 months.  01/24/2022 - lab visit - potassium improved to 4.4 but Scr decreased. Advised to follow up with nephrology. 01/15/2022 - PCP (Dr [name]) SeenCharlett Blakefollow up. Labs checked. Noted low potassium. Started potassium supplement 10 mEq daily. Recheck labs in 2 weeks.   Objective: Review of patient status, including review of consultants reports, laboratory and other test data, was performed as part of comprehensive evaluation and provision of chronic care management services.   Lab Results  Component Value Date   CREATININE 1.97 (H) 01/24/2022   CREATININE 1.65 (H) 01/15/2022   CREATININE 1.78 (H) 10/02/2021    Lab Results  Component Value Date   HGBA1C 7.1 (H) 01/15/2022       Component Value Date/Time   CHOL 149 01/15/2022 1146   TRIG 110.0 01/15/2022 1146   HDL 43.80 01/15/2022 1146   CHOLHDL 3 01/15/2022 1146   VLDL 22.0 01/15/2022 1146   LDLCALC 83 01/15/2022 1146   LDLCALC 101 (H) 12/16/2019 1543   LDLDIRECT 65.0 11/03/2018 1038     Clinical ASCVD: Yes  The ASCVD Risk score (Arnett DK, et al., 2019) failed to calculate for the following reasons:   The 2019 ASCVD risk score is only valid for ages 40 to 79   67P Re37ings from Last 3 Encounters:  02/22/22 (!) 168/90  01/15/22 130/78  10/25/21 (!) 141/82   Blood pressure at nephrology office  visit 02/01/2022 was 136/70  Allergies  Allergen Reactions   Atorvastatin     Myalgias / leg pain and weakness   Penicillins Hives and Itching    Has patient had a PCN reaction causing immediate rash, facial/tongue/throat swelling, SOB or lightheadedness with hypotension:Yes Has patient had a PCN reaction causing severe rash involving mucus membranes or skin necrosis:No Has patient had a PCN reaction that required hospitalization:No Has patient had a  PCN reaction occurring within the last 10 years:No If all of the above answers are "NO", then may proceed with Cephalosporin use.    Verapamil     Junctional rhythm   Influenza Vaccines Rash    tinnitus    Medications Reviewed Today     Reviewed by Cherre Robins, RPH-CPP (Pharmacist) on 03/05/22 at 1130  Med List Status: <None>   Medication Order Taking? Sig Documenting Provider Last Dose Status Informant  acetaminophen (TYLENOL) 500 MG tablet [id number] Yes Take 500 mg by mouth every 6 (six) hours as needed for moderate pain. [provider] Taking Active Child  b complex vitamins capsule [id number] Yes Take 1 capsule by mouth daily. [provider] Taking Active   ELIQUIS 2.5 MG TABS tablet [id number] Yes TAKE 1 TABLET BY MOUTH TWICE A DAY [name], MD Taking Active Child           Med Note Antony Contras, [place] Nov 21, 2021 10:58 AM) Approved 08/29 thru 03/17/2022 to get from medication assistance program with BMS  empagliflozin (JARDIANCE) 25 MG TABS tablet [id number] Yes Take 25 mg by mouth daily. [provider] Taking Active   ezetimibe (ZETIA) 10 MG tablet [id number] Yes Take 1 tablet (10 mg total) by mouth daily. Pixie Casino, MD Taking Active   furosemide (LASIX) 40 MG tablet [id number] Yes Take 0.5-1 tablets (20-40 mg total) by mouth daily. TAKE 40MG TWICE DAILY FOR 5 DAYS, THEN 40MG ONCE DAILY [name], MD Taking Active   glipiZIDE (GLUCOTROL) 5 MG tablet [id number] Yes Take 1 tablet (5 mg total) by mouth daily before breakfast. [name], MD Taking Active            Med Note Antony Contras, [place]   Wed Jan 30, 2022 11:36 AM)    glucose blood (ONE TOUCH ULTRA TEST) test strip [id number] Yes USE 1 STRIP 2 TIMES DAILY TO CHECK BLOOD SUGAR DX E08.22, N18.3 [name], MD Taking Active Child  Krill Oil 1000 MG CAPS [id number] Yes Take 1,000 mg by mouth daily. [provider] Taking Active Child  losartan (COZAAR) 25 MG tablet  [id number] Yes Take 1 tablet (25 mg total) by mouth daily. Pixie Casino, MD Taking Active   Multiple Vitamin (MULTIVITAMIN WITH MINERALS) TABS tablet [id number] Yes Take 1 tablet by mouth daily. [provider] Taking Active Child  potassium chloride SA (KLOR-CON M) 20 MEQ tablet [id number] Yes Take 10 mEq by mouth daily. [provider] Taking Active            Med Note Antony Contras, [place]   Tue Mar 05, 2022 11:30 AM)    sitaGLIPtin (JANUVIA) 25 MG tablet [id number] Yes Take 1 tablet (25 mg total) by mouth daily. [name], MD Taking Active            Med Note Antony Contras, [place] Nov 21, 2021 10:59 AM) Medication assistance program approved thru 03/17/2022  tamsulosin (FLOMAX) 0.4 MG CAPS capsule [id number] No Take 1  capsule (0.4 mg total) by mouth at bedtime.  Patient not taking: Reported on 03/05/2022   Pixie Casino, MD Not Taking Active Child           Med Note [facility] [place]   Tue May 29, 2021 10:36 AM)    Zinc 50 MG TABS [id number] Yes Take 50 mg by mouth daily. [provider] Taking Active Child            Patient Active Problem List   Diagnosis Date Noted   Permanent atrial fibrillation ([place]) 05/24/2021   Malnutrition of moderate degree 04/27/2021   Unilateral primary osteoarthritis, right knee 02/01/2021   Fatty liver 12/01/2020   Pedal edema 08/14/2020   Knee pain, bilateral 09/20/2019   Morbid obesity ([place]) 09/14/2019   Urinary frequency 11/06/2016   S/P placement of cardiac pacemaker 10/24/2016   Chronic diastolic CHF (congestive heart failure) ([place]) 10/24/2016   Cardiac device in situ    Debility 08/15/2016   Vitamin D deficiency 07/17/2015   Preventative health care 07/17/2015   Lumbago 12/02/2014   Pain of toe of left foot 03/14/2014   Essential hypertension 01/13/2013   Insomnia 06/27/2011   Osteopenia 09/02/2010   Family history of colon cancer 09/02/2010   Diabetes mellitus with chronic kidney disease ([place])     Hyperlipidemia LDL goal <70    CTS (carpal tunnel syndrome) 08/28/2010    Medication Assistance: approved for patient assistance for Januvia, Jardiance and Eliquis through 03/17/2022     Assessment / Plan: Type 2 DM: A1c increased but due to patient age and comorbidities A1c < 7.5% is acceptable.  Called [facility] and updated Rx for Jardiance for 66m daily. Patient should received in 7 to 10 business days.  Also requested ref[facility] 211mdaily [facility].  Continue jardiance 2553mJanuvia 57m20md glipizide 5mg 21mly.  Hypokalemia: corrected  Continue potassium chloride 10 mEq daily  CKD - Scr stable.  Continue with plan to follow up with nephrology. Continue Jardiance to 57mg 68m to have BMET rechecked - will get at appt with Dr Hilty Debara Pickett/2023 Hyperlipidemia -  Continue ezetimibe 10mg d22m. Follow up with Dr Hilty aDebara Pickettnned 02/26/2022 Medication Management: enrolled thru 03/17/2022 to get JardianCarvel Gettingiquis from patient assistance program. We have started process for reapplying for 2024. Should be able to apply for Jardiance and Januvia now but will have to wait until he spends 3% of income out of pocket to apply for Eliquis in 2024   Follow Up:  Telephone follow up appointment with care management team member scheduled for:  1 to 2 months   Vernette Moise ECherre RobinsD Clinical Pharmacist [facility] [phone]

## 2022-03-07 ENCOUNTER — Encounter: Payer: Self-pay | Admitting: Internal Medicine

## 2022-03-07 ENCOUNTER — Ambulatory Visit: Payer: PPO | Attending: Internal Medicine | Admitting: Internal Medicine

## 2022-03-07 VITALS — BP 138/80 | HR 72 | Ht 72.0 in | Wt 193.4 lb

## 2022-03-07 DIAGNOSIS — I4821 Permanent atrial fibrillation: Secondary | ICD-10-CM | POA: Diagnosis not present

## 2022-03-07 DIAGNOSIS — Z950 Presence of cardiac pacemaker: Secondary | ICD-10-CM

## 2022-03-07 DIAGNOSIS — I5042 Chronic combined systolic (congestive) and diastolic (congestive) heart failure: Secondary | ICD-10-CM | POA: Diagnosis not present

## 2022-03-07 MED ORDER — EZETIMIBE 10 MG PO TABS: 10.0000 mg | ORAL_TABLET | Freq: Every day | ORAL | 3 refills | Status: DC

## 2022-03-07 NOTE — Progress Notes (Signed)
OFFICE FOLLOW-UP NOTE  Chief Complaint:  Follow-up CHF  Primary Care Physician: [name], MD  HPI:  Vincent Allen is a 88 y.o. male with a past medial history significant for atrial fibrillation/flutter with slow ventricular response. Seen recently in the hospital after mechanical fall in the home. He has a history of paroxysmal atrial fibrillation in the past and was seen by Dr. Wynonia Lawman that was not on any anticoagulation. His CHADSVASC score is 4. He was started on Eliquis 2.5 mg twice a day due to age greater than 80 creatinine greater than 1.5. An echocardiogram was performed which showed normal systolic function, diastolic dysfunction moderate left atrial enlargement. I arranged for outpatient monitoring which shows persistent atrial fibrillation/flutter with controlled ventricular response and at times bradycardia but no clear long pauses or any other indications for pacemaker at this time. He reports being fatigued. He's been an active problem for more than 60 years and is interested in getting back to [place] feels that his lack of energy is keeping him back from that.  09/12/2016  Vincent Allen returns for follow-up today. He underwent recent a cardioversion by Dr. Sallyanne Kuster. This was successful and he reported feeling a little better with heart rates in sinus in the 50s for about a week, however then started to feel more fatigued. He's not been on any AV nodal blocking medications. He reports compliance with Eliquis. EKG today shows atrial fibrillation with a marked bradycardic response of 40. Notes that his heart rate will not necessarily increase with any exertion.  10/24/2016  Vincent Allen returns today for follow-up. He underwent successful pacemaker placement by Dr. Caryl Comes about 3 weeks ago. This was a Medtronic dual-chamber pacemaker. Subsequent pacemaker follow-up shows 100% atrial fibrillation. Pacemaker appears to be working properly. The wound appears to be well-healed. He is restricted  his arm movement now and is interested in more activity. He recently had lab work in advance to upcoming visit with Dr. Randel Pigg. Total cholesterol is 110, Travis was 167, HDL 37 and LDL 39. Metabolic profile was unremarkable with potassium however was slightly low at 3.4 and creatinine 1.45. Blood pressure is elevated today. I reviewed home readings indicating his blood pressure ranges from the about the 151V to 616W systolic over 73X diastolic. Heart rate is been mostly in the 70s and 80s. There is no evidence of rapid atrial fibrillation or bradycardia. He reports improvement in his energy level.  04/28/2017  Vincent Allen returns today for follow-up.  He has had significant improvement in his symptoms after placement of pacemaker.  He still gets a little short of breath but is not been quite as active.  Weight is somewhat better now to 19 and was as high as 225 in November.  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  Blood pressure is well controlled today.  Has had follow-up with Dr. Caryl Comes who feels the pacemaker is working appropriately.  05/11/2018  Vincent Allen is seen today in annual follow-up.  Overall he is doing well.  He is not been working on his airplane is much recently.  He says that he is worried about falls.  He did sell off a lot of his projects as well as his angina recently.  He denies any chest pain or shortness of breath.  He said no syncopal episodes.  Recent pacemaker interrogation shows a normal pacing and he is ventricular paced today.  He has persistent if not permanent A. fib.  He is  not have any bleeding problems on Eliquis.  Blood pressures been well controlled.  Lab work recently and April 23, 2018 showed total cholesterol 171, HDL 34, triglycerides of 182 and LDL of 100.  His goal LDL should be slightly lower than that I would suspect if we want to be aggressive probably less than 100.  He is not listed to be on statin therapy but previously was on atorvastatin  20 mg.  He is not sure if he is taking it but again I had him listed on that a year ago.  I would advise if he is come off the medication to restart that otherwise we may need to consider increasing it further.  05/09/2021  Vincent Allen is seen today in follow-up.  He was recently hospitalized and I cared for him for about a week of that hospitalization that actually went more than 2 weeks.  He had symptoms of right heart failure but also had some LV dysfunction with EF around 45%.  He underwent paracentesis with about 5 L of fluid removed.  This was transudative and look more like heart failure.  Subsequently was placed on milrinone and had good color oximetry panels.  He diuresed from 244 pounds down to 201 pounds today which is his new her dry weight.  On discharge his Lasix was increased to 40 mg twice daily.  He is currently taking that in the morning.  He continues to have issues with nocturia and has had BPH for years but is not on treatment for that.  I suspect this is the reason for his nocturnal overflow incontinence.  Blood pressure is good today.  He says he feels well and is ambulating without difficulty.  Oxygen saturation has improved.  He will need repeat labs including metabolic profile and magnesium because of issues with hypokalemia during the hospitalization.  He has a transition of care follow-up with heart failure on February 24.  It was also noted that he had 100% ventricular pacing on recent remote checks.  This could be an etiology of his reduced LV function.  He was seen by Dr. Sallyanne Kuster, who recommended evaluation for CRT-P therapy.  I would then refer Vincent Allen to our electrophysiologist.  10/25/2021  Vincent Allen returns today for follow-up.  He had been referred to Dr. Caryl Comes for evaluation of possible biventricular pacemaker upgrade.  He did qualify for that and had placement of that earlier this year.  Since then he has had significant improvement in his heart failure symptoms.  Now NYHA class  II.  He has been seen by Dr. Caryl Comes recently and his primary care provider who increased his diuretic.  He 1 point he was up to 80 mg of Lasix but noted being dizzy on this dose.  He is currently on Lasix 40 mg daily.  He has appeared euvolemic.  Blood pressure is elevated somewhat today.  He is only on Jardiance for heart failure medications.  He does have stage III-IV chronic kidney disease.  GFR around 32.  Recent lipids in July showed total cholesterol 161, HDL 41, triglycerides 90 and LDL 102.  03/08/2022  Vincent Allen is seen today for follow-up.  He is doing well and is accompanied by his son.  He is having no chest pain or worsening shortness of breath.  He does have fairly poor hearing.  His remote pacer checks have been stable.  Lipids have been reasonably well-controlled with LDL of 83.  He is due for refill of his Zetia.  He  denies any worsening heart failure symptoms.  EKG shows a ventricular paced rhythm.  Even though he had clinical response to BiV pacing, his LVEF had not really improved.  PMHx:  Past Medical History:  Diagnosis Date   Arthritis    Bradycardia    Cataract    Chronic kidney disease stage III (GFR 30-59 ml/min) 05/03/2012   Colon polyps    Complication of anesthesia    emesis   Diabetes mellitus    GERD (gastroesophageal reflux disease)    HOH (hard of hearing)    HTN (hypertension) 01/13/2013   Hyperlipidemia    Hypertension    Pacemaker 12/2016   Persistent atrial fibrillation (HCC)    Presence of permanent cardiac pacemaker 09/20/2016   Symptomatic bradycardia    Vitamin D deficiency 07/17/2015    Past Surgical History:  Procedure Laterality Date   ANKLE ARTHROPLASTY     BIV PACEMAKER INSERTION CRT-P N/A 06/22/2021   Procedure: UPGRADE TO BIV PACEMAKER INSERTION CRT-P;  Surgeon: Deboraha Sprang, MD;  Location: [facility];  Service: Cardiovascular;  Laterality: N/A;   CARDIOVERSION N/A 08/20/2016   Procedure: CARDIOVERSION;  Surgeon: Sanda Klein, MD;   Location: [facility];  Service: Cardiovascular;  Laterality: N/A;   CATARACT EXTRACTION  2010, 2014   CATARACT EXTRACTION  02/28/14   CHOLECYSTECTOMY     CHOLECYSTECTOMY N/A 05/03/2012   Procedure: LAPAROSCOPIC CHOLECYSTECTOMY WITH INTRAOPERATIVE CHOLANGIOGRAM;  Surgeon: [name], MD;  Location: [facility];  Service: General;  Laterality: N/A;   INSERT / REPLACE / REMOVE PACEMAKER  09/20/2016   PACEMAKER IMPLANT N/A 09/20/2016   Procedure: Pacemaker Implant;  Surgeon: Deboraha Sprang, MD;  Location: [facility];  Service: Cardiovascular;  Laterality: N/A;   ROTATOR CUFF REPAIR     SHOULDER SURGERY     TONSILLECTOMY      FAMHx:  Family History  Problem Relation Age of Onset   Cancer Mother        colon, breast, pancreas, skin cancer   Heart disease Father        heart valve replaced   Stroke Father    Diabetes Father    Cancer Paternal Grandmother        colon   Obesity Son    Heart disease Son        bradycardia    SOCHx:   reports that he has never smoked. He has never used smokeless tobacco. He reports current alcohol use of about 1.0 standard drink of alcohol per week. He reports that he does not use drugs.  ALLERGIES:  Allergies  Allergen Reactions   Atorvastatin     Myalgias / leg pain and weakness   Penicillins Hives and Itching    Has patient had a PCN reaction causing immediate rash, facial/tongue/throat swelling, SOB or lightheadedness with hypotension:Yes Has patient had a PCN reaction causing severe rash involving mucus membranes or skin necrosis:No Has patient had a PCN reaction that required hospitalization:No Has patient had a PCN reaction occurring within the last 10 years:No If all of the above answers are "NO", then may proceed with Cephalosporin use.    Verapamil     Junctional rhythm   Influenza Vaccines Rash    tinnitus    ROS: Pertinent items noted in HPI and remainder of comprehensive ROS otherwise negative.  HOME MEDS: Current  Outpatient Medications on File Prior to Visit  Medication Sig Dispense Refill   acetaminophen (TYLENOL) 500 MG tablet Take 500 mg by  mouth every 6 (six) hours as needed for moderate pain.     b complex vitamins capsule Take 1 capsule by mouth daily.     ELIQUIS 2.5 MG TABS tablet TAKE 1 TABLET BY MOUTH TWICE A DAY 60 tablet 5   empagliflozin (JARDIANCE) 25 MG TABS tablet Take 25 mg by mouth daily.     ezetimibe (ZETIA) 10 MG tablet Take 1 tablet (10 mg total) by mouth daily. 90 tablet 3   furosemide (LASIX) 40 MG tablet Take 0.5-1 tablets (20-40 mg total) by mouth daily. TAKE '40MG'$  TWICE DAILY FOR 5 DAYS, THEN '40MG'$  ONCE DAILY 90 tablet 1   glipiZIDE (GLUCOTROL) 5 MG tablet Take 1 tablet (5 mg total) by mouth daily before breakfast. 90 tablet 1   glucose blood (ONE TOUCH ULTRA TEST) test strip USE 1 STRIP 2 TIMES DAILY TO CHECK BLOOD SUGAR DX E08.22, N18.3 300 each 1   Krill Oil 1000 MG CAPS Take 1,000 mg by mouth daily.     losartan (COZAAR) 25 MG tablet Take 1 tablet (25 mg total) by mouth daily. 90 tablet 3   Multiple Vitamin (MULTIVITAMIN WITH MINERALS) TABS tablet Take 1 tablet by mouth daily.     potassium chloride SA (KLOR-CON M) 20 MEQ tablet Take 10 mEq by mouth daily.     sitaGLIPtin (JANUVIA) 25 MG tablet Take 1 tablet (25 mg total) by mouth daily. 30 tablet 2   Zinc 50 MG TABS Take 50 mg by mouth daily.     No current facility-administered medications on file prior to visit.    LABS/IMAGING: No results found for this or any previous visit (from the past 48 hour(s)). No results found.  LIPID PANEL:    Component Value Date/Time   CHOL 149 01/15/2022 1146   TRIG 110.0 01/15/2022 1146   HDL 43.80 01/15/2022 1146   CHOLHDL 3 01/15/2022 1146   VLDL 22.0 01/15/2022 1146   LDLCALC 83 01/15/2022 1146   LDLCALC 101 (H) 12/16/2019 1543   LDLDIRECT 65.0 11/03/2018 1038     WEIGHTS: Wt Readings from Last 3 Encounters:  03/07/22 193 lb 6.4 oz (87.7 kg)  02/20/22 189 lb (85.7 kg)   01/15/22 189 lb 6.4 oz (85.9 kg)    VITALS: BP 138/80   Pulse 72   Ht 6' (1.829 m)   Wt 193 lb 6.4 oz (87.7 kg)   SpO2 97%   BMI 26.23 kg/m   EXAM: General appearance: alert, no distress, and pale Neck: no carotid bruit, no JVD, and thyroid not enlarged, symmetric, no tenderness/mass/nodules Lungs: clear to auscultation bilaterally Heart: regular rate and rhythm, S1, S2 normal, no murmur, click, rub or gallop and Healing pacemaker site left upper chest Abdomen: soft, non-tender; bowel sounds normal; no masses,  no organomegaly and mildly protuberant Extremities: edema 1+ bilateral LE Pulses: 2+ and symmetric Skin: Skin color, texture, turgor normal. No rashes or lesions Neurologic: Grossly normal Psych: Pleasant  EKG: Biventricular paced at 72-personally reviewed  ASSESSMENT: Chronic combined systolic and diastolic with predominant right heart failure symptoms Suspected pacemaker related cardiomyopathy - s/p CRT-P (Medtronic, 06/2021) Persistent atrial fibrillation/flutter with slow ventricular response - s/p Medtronic PPM (07/1759) LBBB Diastolic congestive heart failure CKD 3A CHADSVASC score 4-anticoagulated on Eliquis 2.5 mg twice a day Dyslipidemia-goal LDL less than 70 BPH  PLAN: 1.   Vincent Allen seems to be doing well.  Weight has been pretty stable.  Blood pressure is reasonable.  I will refill his Zetia today.  He is  had no issues with his pacemaker.  He says he does get some occasional twinges in the left chest.  I wonder if this could be diaphragmatic elation.  He should check with the pacer clinic when he has his next evaluation.  Plan follow-up with me in 6 months or sooner as necessary.  Pixie Casino, MD, [facility], [place] Director of the Advanced Lipid Disorders &  Cardiovascular Risk Reduction Clinic Diplomate of the American Board of Clinical Lipidology Attending Cardiologist  Direct Dial: [phone]  Fax:  [phone]  Website:  [email or web address] 03/07/2022, 9:43 AM

## 2022-03-07 NOTE — Patient Instructions (Signed)
Medication Instructions:  NO CHANGES  *If you need a refill on your cardiac medications before your next appointment, please call your pharmacy*    Follow-Up: At [facility], you and your health needs are our priority.  As part of our continuing mission to provide you with exceptional heart care, we have created designated Provider Care Teams.  These Care Teams include your primary Cardiologist (physician) and Advanced Practice Providers (APPs -  Physician Assistants and Nurse Practitioners) who all work together to provide you with the care you need, when you need it.  We recommend signing up for the patient portal called "MyChart".  Sign up information is provided on this After Visit Summary.  MyChart is used to connect with patients for Virtual Visits (Telemedicine).  Patients are able to view lab/test results, encounter notes, upcoming appointments, etc.  Non-urgent messages can be sent to your provider as well.   To learn more about what you can do with MyChart, go to [email or web address].    Your next appointment:    6 months with Dr. Hilty 

## 2022-03-22 ENCOUNTER — Ambulatory Visit (INDEPENDENT_AMBULATORY_CARE_PROVIDER_SITE_OTHER): Payer: PPO

## 2022-03-22 DIAGNOSIS — I5032 Chronic diastolic (congestive) heart failure: Secondary | ICD-10-CM | POA: Diagnosis not present

## 2022-03-22 LAB — CUP PACEART REMOTE DEVICE CHECK
Battery Remaining Longevity: 113 mo
Battery Voltage: 3.01 V
Brady Statistic RA Percent Paced: 0 %
Brady Statistic RV Percent Paced: 99.66 %
Date Time Interrogation Session: 20240105063522
Implantable Lead Connection Status: 753985
Implantable Lead Connection Status: 753985
Implantable Lead Connection Status: 753985
Implantable Lead Implant Date: 20180706
Implantable Lead Implant Date: 20180706
Implantable Lead Implant Date: 20230407
Implantable Lead Location: 753858
Implantable Lead Location: 753859
Implantable Lead Location: 753860
Implantable Lead Model: 5076
Implantable Lead Model: 5076
Implantable Pulse Generator Implant Date: 20230407
Lead Channel Impedance Value: 1121 Ohm
Lead Channel Impedance Value: 1159 Ohm
Lead Channel Impedance Value: 1178 Ohm
Lead Channel Impedance Value: 1349 Ohm
Lead Channel Impedance Value: 1349 Ohm
Lead Channel Impedance Value: 1425 Ohm
Lead Channel Impedance Value: 304 Ohm
Lead Channel Impedance Value: 361 Ohm
Lead Channel Impedance Value: 399 Ohm
Lead Channel Impedance Value: 456 Ohm
Lead Channel Impedance Value: 532 Ohm
Lead Channel Impedance Value: 722 Ohm
Lead Channel Impedance Value: 760 Ohm
Lead Channel Impedance Value: 779 Ohm
Lead Channel Pacing Threshold Amplitude: 0.875 V
Lead Channel Pacing Threshold Amplitude: 1.375 V
Lead Channel Pacing Threshold Pulse Width: 0.4 ms
Lead Channel Pacing Threshold Pulse Width: 0.8 ms
Lead Channel Sensing Intrinsic Amplitude: 1.125 mV
Lead Channel Sensing Intrinsic Amplitude: 1.125 mV
Lead Channel Sensing Intrinsic Amplitude: 7 mV
Lead Channel Sensing Intrinsic Amplitude: 7 mV
Lead Channel Setting Pacing Amplitude: 2 V
Lead Channel Setting Pacing Amplitude: 2 V
Lead Channel Setting Pacing Pulse Width: 0.4 ms
Lead Channel Setting Pacing Pulse Width: 0.8 ms
Lead Channel Setting Sensing Sensitivity: 1.2 mV
Zone Setting Status: 755011
Zone Setting Status: 755011

## 2022-03-27 ENCOUNTER — Other Ambulatory Visit: Payer: Self-pay | Admitting: Family Medicine

## 2022-04-09 NOTE — Progress Notes (Signed)
Remote pacemaker transmission.   

## 2022-04-13 ENCOUNTER — Other Ambulatory Visit: Payer: Self-pay | Admitting: Family Medicine

## 2022-04-28 ENCOUNTER — Encounter: Payer: Self-pay | Admitting: Family Medicine

## 2022-04-29 ENCOUNTER — Other Ambulatory Visit: Payer: Self-pay | Admitting: Family Medicine

## 2022-04-29 DIAGNOSIS — M545 Low back pain, unspecified: Secondary | ICD-10-CM

## 2022-04-30 DIAGNOSIS — M5416 Radiculopathy, lumbar region: Secondary | ICD-10-CM | POA: Diagnosis not present

## 2022-05-08 ENCOUNTER — Other Ambulatory Visit: Payer: Self-pay | Admitting: Anesthesiology

## 2022-05-08 DIAGNOSIS — M791 Myalgia, unspecified site: Secondary | ICD-10-CM | POA: Diagnosis not present

## 2022-05-08 DIAGNOSIS — M5451 Vertebrogenic low back pain: Secondary | ICD-10-CM

## 2022-05-08 DIAGNOSIS — M5459 Other low back pain: Secondary | ICD-10-CM | POA: Diagnosis not present

## 2022-05-16 ENCOUNTER — Ambulatory Visit
Admission: RE | Admit: 2022-05-16 | Discharge: 2022-05-16 | Disposition: A | Discharge status: Home / Self Care | Payer: PPO | Source: Ambulatory Visit | Attending: Anesthesiology | Admitting: Anesthesiology

## 2022-05-16 ENCOUNTER — Other Ambulatory Visit: Payer: Self-pay | Admitting: Anesthesiology

## 2022-05-16 DIAGNOSIS — M5451 Vertebrogenic low back pain: Secondary | ICD-10-CM

## 2022-05-16 DIAGNOSIS — M4316 Spondylolisthesis, lumbar region: Secondary | ICD-10-CM | POA: Diagnosis not present

## 2022-05-16 DIAGNOSIS — M48061 Spinal stenosis, lumbar region without neurogenic claudication: Secondary | ICD-10-CM | POA: Diagnosis not present

## 2022-05-16 DIAGNOSIS — M5126 Other intervertebral disc displacement, lumbar region: Secondary | ICD-10-CM | POA: Diagnosis not present

## 2022-05-16 MED ORDER — MEPERIDINE HCL 50 MG/ML IJ SOLN: 50.0000 mg | Freq: Once | INTRAMUSCULAR | Status: DC | PRN

## 2022-05-16 MED ORDER — ONDANSETRON HCL 4 MG/2ML IJ SOLN: 4.0000 mg | Freq: Once | INTRAMUSCULAR | Status: DC | PRN

## 2022-05-16 MED ORDER — DIAZEPAM 5 MG PO TABS
5.0000 mg | ORAL_TABLET | Freq: Once | ORAL | Status: AC
  Administered 2022-05-16: 5 mg via ORAL

## 2022-05-16 MED ORDER — IOPAMIDOL (ISOVUE-M 200) INJECTION 41%
15.0000 mL | Freq: Once | INTRAMUSCULAR | Status: AC
  Administered 2022-05-16: 15 mL via INTRATHECAL

## 2022-05-16 NOTE — Discharge Instructions (Signed)

## 2022-05-24 ENCOUNTER — Ambulatory Visit: Payer: PPO | Admitting: Podiatry

## 2022-06-05 DIAGNOSIS — I509 Heart failure, unspecified: Secondary | ICD-10-CM | POA: Diagnosis not present

## 2022-06-05 DIAGNOSIS — N1832 Chronic kidney disease, stage 3b: Secondary | ICD-10-CM | POA: Diagnosis not present

## 2022-06-05 DIAGNOSIS — E1122 Type 2 diabetes mellitus with diabetic chronic kidney disease: Secondary | ICD-10-CM | POA: Diagnosis not present

## 2022-06-05 DIAGNOSIS — I4891 Unspecified atrial fibrillation: Secondary | ICD-10-CM | POA: Diagnosis not present

## 2022-06-05 DIAGNOSIS — I129 Hypertensive chronic kidney disease with stage 1 through stage 4 chronic kidney disease, or unspecified chronic kidney disease: Secondary | ICD-10-CM | POA: Diagnosis not present

## 2022-06-06 ENCOUNTER — Telehealth: Payer: Self-pay

## 2022-06-06 LAB — LAB REPORT - SCANNED
Albumin, Urine POC: 118.2
Creatinine, POC: 128.6 mg/dL
EGFR: 36
Microalb Creat Ratio: 92

## 2022-06-06 NOTE — Telephone Encounter (Signed)
   Pre-operative Risk Assessment    Patient Name: Vincent Allen  DOB: 01-25-1934 MRN: [id number]      Request for Surgical Clearance    Procedure:   Lumbar Selective Nerve Root Block  Date of Surgery:  Clearance 06/11/22                                 Surgeon:  Gaynelle Arabian Surgeon's Group or Practice Name:  [facility] Phone number:  [phone] Fax number:  [phone]   Type of Clearance Requested:   - Pharmacy:  Hold Apixaban (Eliquis) 3   Type of Anesthesia:  Not Indicated   Additional requests/questions:    Signed, Wonda Horner   06/06/2022, 2:15 PM

## 2022-06-06 NOTE — Telephone Encounter (Signed)
   Patient Name: Vincent Allen  DOB: 10-27-1933 MRN: [id number]  Primary Cardiologist: Pixie Casino, MD  Chart reviewed as part of pre-operative protocol coverage. Pre-op clearance already addressed by colleagues in earlier phone notes. To summarize recommendations:  -Per office protocol, patient can hold Eliquis for 3 days prior to procedure. Please restart when safe to do so.  Medical clearance was not requested.   Will route this bundled recommendation to requesting provider via Epic fax function and remove from pre-op pool. Please call with questions.  Elgie Collard, PA-C 06/06/2022, 4:39 PM

## 2022-06-06 NOTE — Telephone Encounter (Signed)
Patient with diagnosis of afib on Eliquis for anticoagulation.    Procedure: Lumbar Selective Nerve Root Block  Date of procedure: 06/11/22   CHA2DS2-VASc Score = 5   This indicates a 7.2% annual risk of stroke. The patient's score is based upon: CHF History: 1 HTN History: 1 Diabetes History: 1 Stroke History: 0 Vascular Disease History: 0 Age Score: 2 Gender Score: 0      CrCl 30.4 ml/min  Per office protocol, patient can hold Eliquis for 3 days prior to procedure.    **This guidance is not considered finalized until pre-operative APP has relayed final recommendations.**

## 2022-06-11 DIAGNOSIS — M5416 Radiculopathy, lumbar region: Secondary | ICD-10-CM | POA: Diagnosis not present

## 2022-06-14 ENCOUNTER — Other Ambulatory Visit: Payer: Self-pay | Admitting: Family Medicine

## 2022-06-14 DIAGNOSIS — I4819 Other persistent atrial fibrillation: Secondary | ICD-10-CM

## 2022-06-17 ENCOUNTER — Other Ambulatory Visit: Payer: Self-pay | Admitting: Family Medicine

## 2022-06-17 ENCOUNTER — Encounter: Payer: Self-pay | Admitting: Family Medicine

## 2022-06-17 MED ORDER — GLIPIZIDE 5 MG PO TABS: ORAL_TABLET | ORAL | 1 refills | Status: DC

## 2022-06-19 ENCOUNTER — Ambulatory Visit: Payer: PPO | Admitting: Podiatry

## 2022-06-19 ENCOUNTER — Other Ambulatory Visit: Payer: Self-pay | Admitting: Family Medicine

## 2022-06-19 ENCOUNTER — Encounter: Payer: Self-pay | Admitting: Podiatry

## 2022-06-19 DIAGNOSIS — B351 Tinea unguium: Secondary | ICD-10-CM

## 2022-06-19 DIAGNOSIS — M79675 Pain in left toe(s): Secondary | ICD-10-CM | POA: Diagnosis not present

## 2022-06-19 DIAGNOSIS — N183 Chronic kidney disease, stage 3 unspecified: Secondary | ICD-10-CM

## 2022-06-19 DIAGNOSIS — M79674 Pain in right toe(s): Secondary | ICD-10-CM

## 2022-06-19 DIAGNOSIS — E0822 Diabetes mellitus due to underlying condition with diabetic chronic kidney disease: Secondary | ICD-10-CM

## 2022-06-19 NOTE — Progress Notes (Signed)
This patient returns to my office for at risk foot care.  This patient requires this care by a professional since this patient will be at risk due to having diabetes and coagulation defect.  This patient is unable to cut nails himself since the patient cannot reach his nails.These nails are painful walking and wearing shoes.  This patient presents for at risk foot care today.  General Appearance  Alert, conversant and in no acute stress.  Vascular  Dorsalis pedis and posterior tibial  pulses are weakly  palpable  bilaterally.  Capillary return is within normal limits  bilaterally. Temperature is within normal limits  bilaterally. Venous stasis  B/L.  Neurologic  Senn-Weinstein monofilament wire test within normal limits  bilaterally. Muscle power within normal limits bilaterally.  Nails Thick disfigured discolored nails with subungual debris  from hallux to fifth toes bilaterally. No evidence of bacterial infection or drainage bilaterally.  Orthopedic  No limitations of motion  feet .  No crepitus or effusions noted.  No bony pathology or digital deformities noted.  Skin  normotropic skin with no porokeratosis noted bilaterally.  No signs of infections or ulcers noted.     Onychomycosis  Pain in right toes  Pain in left toes  Consent was obtained for treatment procedures.   Mechanical debridement of nails 1-5  bilaterally performed with a nail nipper.  Filed with dremel without incident.    Return office visit   4  months                   Told patient to return for periodic foot care and evaluation due to potential at risk complications.   Gardiner Barefoot DPM

## 2022-06-20 DIAGNOSIS — M48061 Spinal stenosis, lumbar region without neurogenic claudication: Secondary | ICD-10-CM | POA: Insufficient documentation

## 2022-06-21 ENCOUNTER — Ambulatory Visit (INDEPENDENT_AMBULATORY_CARE_PROVIDER_SITE_OTHER): Payer: PPO

## 2022-06-21 DIAGNOSIS — I5032 Chronic diastolic (congestive) heart failure: Secondary | ICD-10-CM

## 2022-06-22 LAB — CUP PACEART REMOTE DEVICE CHECK
Battery Remaining Longevity: 111 mo
Battery Voltage: 3 V
Brady Statistic RA Percent Paced: 0 %
Brady Statistic RV Percent Paced: 99.64 %
Date Time Interrogation Session: 20240405073156
Implantable Lead Connection Status: 753985
Implantable Lead Connection Status: 753985
Implantable Lead Connection Status: 753985
Implantable Lead Implant Date: 20180706
Implantable Lead Implant Date: 20180706
Implantable Lead Implant Date: 20230407
Implantable Lead Location: 753858
Implantable Lead Location: 753859
Implantable Lead Location: 753860
Implantable Lead Model: 5076
Implantable Lead Model: 5076
Implantable Pulse Generator Implant Date: 20230407
Lead Channel Impedance Value: 1178 Ohm
Lead Channel Impedance Value: 1178 Ohm
Lead Channel Impedance Value: 1254 Ohm
Lead Channel Impedance Value: 1273 Ohm
Lead Channel Impedance Value: 1349 Ohm
Lead Channel Impedance Value: 1387 Ohm
Lead Channel Impedance Value: 342 Ohm
Lead Channel Impedance Value: 399 Ohm
Lead Channel Impedance Value: 437 Ohm
Lead Channel Impedance Value: 494 Ohm
Lead Channel Impedance Value: 570 Ohm
Lead Channel Impedance Value: 703 Ohm
Lead Channel Impedance Value: 722 Ohm
Lead Channel Impedance Value: 798 Ohm
Lead Channel Pacing Threshold Amplitude: 0.875 V
Lead Channel Pacing Threshold Amplitude: 1.375 V
Lead Channel Pacing Threshold Pulse Width: 0.4 ms
Lead Channel Pacing Threshold Pulse Width: 0.8 ms
Lead Channel Sensing Intrinsic Amplitude: 1.125 mV
Lead Channel Sensing Intrinsic Amplitude: 1.125 mV
Lead Channel Sensing Intrinsic Amplitude: 7 mV
Lead Channel Sensing Intrinsic Amplitude: 7 mV
Lead Channel Setting Pacing Amplitude: 2 V
Lead Channel Setting Pacing Amplitude: 2 V
Lead Channel Setting Pacing Pulse Width: 0.4 ms
Lead Channel Setting Pacing Pulse Width: 0.8 ms
Lead Channel Setting Sensing Sensitivity: 1.2 mV
Zone Setting Status: 755011
Zone Setting Status: 755011

## 2022-06-25 DIAGNOSIS — M48062 Spinal stenosis, lumbar region with neurogenic claudication: Secondary | ICD-10-CM | POA: Diagnosis not present

## 2022-06-25 DIAGNOSIS — M7918 Myalgia, other site: Secondary | ICD-10-CM | POA: Insufficient documentation

## 2022-06-25 DIAGNOSIS — M5451 Vertebrogenic low back pain: Secondary | ICD-10-CM | POA: Insufficient documentation

## 2022-07-01 DIAGNOSIS — M4722 Other spondylosis with radiculopathy, cervical region: Secondary | ICD-10-CM | POA: Diagnosis not present

## 2022-07-06 ENCOUNTER — Other Ambulatory Visit: Payer: Self-pay | Admitting: Family Medicine

## 2022-07-08 ENCOUNTER — Encounter: Payer: Self-pay | Admitting: *Deleted

## 2022-07-24 NOTE — Progress Notes (Signed)
Remote pacemaker transmission.   

## 2022-07-29 NOTE — Assessment & Plan Note (Signed)
Well controlled, no changes to meds. Encouraged heart healthy diet such as the DASH diet and exercise as tolerated.  °

## 2022-07-29 NOTE — Assessment & Plan Note (Signed)
Tolerating Eliquis, rate controlled 

## 2022-07-29 NOTE — Assessment & Plan Note (Signed)
No recent exacerbation, no changes 

## 2022-07-29 NOTE — Assessment & Plan Note (Signed)
Patient encouraged to maintain heart healthy diet, regular exercise, adequate sleep. Consider daily probiotics. Take medications as prescribed. Labs ordered and reviewed. He has aged out of colonoscopy for screening purposes. He continues to decline Covid vaccination

## 2022-07-29 NOTE — Assessment & Plan Note (Signed)
Encourage heart healthy diet such as MIND or DASH diet, increase exercise, avoid trans fats, simple carbohydrates and processed foods, consider a krill or fish or flaxseed oil cap daily.  °

## 2022-07-29 NOTE — Assessment & Plan Note (Signed)
Encouraged DASH or MIND diet, decrease po intake and increase exercise as tolerated. Needs 7-8 hours of sleep nightly. Avoid trans fats, eat small, frequent meals every 4-5 hours with lean proteins, complex carbs and healthy fats. Minimize simple carbs, high fat foods and processed foods 

## 2022-07-29 NOTE — Assessment & Plan Note (Signed)
Supplement and monitor 

## 2022-07-30 ENCOUNTER — Ambulatory Visit (INDEPENDENT_AMBULATORY_CARE_PROVIDER_SITE_OTHER): Payer: PPO | Admitting: Family Medicine

## 2022-07-30 VITALS — BP 130/78 | HR 83 | Temp 98.1°F | Resp 16 | Ht 72.0 in | Wt 190.6 lb

## 2022-07-30 DIAGNOSIS — E663 Overweight: Secondary | ICD-10-CM | POA: Diagnosis not present

## 2022-07-30 DIAGNOSIS — R296 Repeated falls: Secondary | ICD-10-CM | POA: Diagnosis not present

## 2022-07-30 DIAGNOSIS — Z0000 Encounter for general adult medical examination without abnormal findings: Secondary | ICD-10-CM

## 2022-07-30 DIAGNOSIS — N183 Chronic kidney disease, stage 3 unspecified: Secondary | ICD-10-CM | POA: Diagnosis not present

## 2022-07-30 DIAGNOSIS — E785 Hyperlipidemia, unspecified: Secondary | ICD-10-CM | POA: Diagnosis not present

## 2022-07-30 DIAGNOSIS — I4821 Permanent atrial fibrillation: Secondary | ICD-10-CM

## 2022-07-30 DIAGNOSIS — E0822 Diabetes mellitus due to underlying condition with diabetic chronic kidney disease: Secondary | ICD-10-CM

## 2022-07-30 DIAGNOSIS — E559 Vitamin D deficiency, unspecified: Secondary | ICD-10-CM

## 2022-07-30 DIAGNOSIS — I5032 Chronic diastolic (congestive) heart failure: Secondary | ICD-10-CM | POA: Diagnosis not present

## 2022-07-30 DIAGNOSIS — M8580 Other specified disorders of bone density and structure, unspecified site: Secondary | ICD-10-CM

## 2022-07-30 DIAGNOSIS — I10 Essential (primary) hypertension: Secondary | ICD-10-CM | POA: Diagnosis not present

## 2022-07-30 LAB — COMPREHENSIVE METABOLIC PANEL
ALT: 19 U/L (ref 0–53)
AST: 19 U/L (ref 0–37)
Albumin: 3.6 g/dL (ref 3.5–5.2)
Alkaline Phosphatase: 68 U/L (ref 39–117)
BUN: 38 mg/dL — ABNORMAL HIGH (ref 6–23)
CO2: 25 mEq/L (ref 19–32)
Calcium: 9 mg/dL (ref 8.4–10.5)
Chloride: 104 mEq/L (ref 96–112)
Creatinine, Ser: 1.73 mg/dL — ABNORMAL HIGH (ref 0.40–1.50)
GFR: 35.18 mL/min — ABNORMAL LOW (ref >60.00)
Glucose, Bld: 132 mg/dL — ABNORMAL HIGH (ref 70–99)
Potassium: 4 mEq/L (ref 3.5–5.1)
Sodium: 140 mEq/L (ref 135–145)
Total Bilirubin: 0.7 mg/dL (ref 0.2–1.2)
Total Protein: 6.2 g/dL (ref 6.0–8.3)

## 2022-07-30 LAB — CBC WITH DIFFERENTIAL/PLATELET
Basophils Absolute: 0 10*3/uL (ref 0.0–0.1)
Basophils Relative: 0.4 % (ref 0.0–3.0)
Eosinophils Absolute: 0.4 10*3/uL (ref 0.0–0.7)
Eosinophils Relative: 4.7 % (ref 0.0–5.0)
HCT: 44.4 % (ref 39.0–52.0)
Hemoglobin: 14.2 g/dL (ref 13.0–17.0)
Lymphocytes Relative: 19.8 % (ref 12.0–46.0)
Lymphs Abs: 1.6 10*3/uL (ref 0.7–4.0)
MCHC: 31.9 g/dL (ref 30.0–36.0)
MCV: 88.7 fl (ref 78.0–100.0)
Monocytes Absolute: 0.9 10*3/uL (ref 0.1–1.0)
Monocytes Relative: 11.3 % (ref 3.0–12.0)
Neutro Abs: 5.1 10*3/uL (ref 1.4–7.7)
Neutrophils Relative %: 63.8 % (ref 43.0–77.0)
Platelets: 176 10*3/uL (ref 150.0–400.0)
RBC: 5.01 Mil/uL (ref 4.22–5.81)
RDW: 14.2 % (ref 11.5–15.5)
WBC: 8 10*3/uL (ref 4.0–10.5)

## 2022-07-30 LAB — LIPID PANEL
Cholesterol: 152 mg/dL (ref 0–200)
HDL: 44 mg/dL (ref >39.00)
LDL Cholesterol: 78 mg/dL (ref 0–99)
NonHDL: 108.36
Total CHOL/HDL Ratio: 3
Triglycerides: 150 mg/dL — ABNORMAL HIGH (ref 0.0–149.0)
VLDL: 30 mg/dL (ref 0.0–40.0)

## 2022-07-30 LAB — TSH: TSH: 1.67 u[IU]/mL (ref 0.35–5.50)

## 2022-07-30 LAB — HEMOGLOBIN A1C: Hgb A1c MFr Bld: 7.5 % — ABNORMAL HIGH (ref 4.6–6.5)

## 2022-07-30 LAB — VITAMIN D 25 HYDROXY (VIT D DEFICIENCY, FRACTURES): VITD: 75.34 ng/mL (ref 30.00–100.00)

## 2022-07-30 NOTE — Progress Notes (Signed)
Subjective:   By signing my name below, I, Vincent Allen, attest that this documentation has been prepared under the direction and in the presence of Vincent Canary, MD., 07/30/2022.   Patient ID: Vincent Allen, male    DOB: [birth year], 89 y.o.   MRN: [id number]  No chief complaint on file.  HPI Patient is in today for a comprehensive physical exam and follow up on chronic medical concerns. He is accompanied by his son.  Chronic Diastolic CHF (congestive heart failure Patient has significant past medical history of atrial fibrillation/flutter with slow ventricular response. He continues to follow with his cardiologist Dr. Zoila Shutter to manage his chronic diastolic CHF and continues taking Eliquis 2.5 mg and Ezetimibe 10 mg which have been tolerable. Additionally, he continues to see his nephrologist every 3-4 months.  DEXA/Vitamin D Deficiency Patient's last DEXA scan was completed on 01/14/2011 which revealed moderate to high fracture risk in the femur neck. He is open to repeating this.   Immunizations Patient declines offered COVID-19, Influenza, Shingles, and Tetanus vaccinations today.  Urinary Frequency  Patient reports that he has been experiencing urinary frequency and incontinence that is worse during the day. He denies dysuria or hematuria. He states that he has an enlarged prostate and is interested in following with urology.  Spinal Stenosis of Lumbar Region Patient continues to experience severe pain due to spinal stenosis of the lumbar region with neurogenic claudication. He continues to follow with [facility] Ceasar [facility] at [facility] and reports that he stopped taking Gabapentin, Hydrocodone, Prednisone, and Tramadol due to increases in his blood glucose. He is now taking Tylenol 500 mg as needed. Today he denies CP/palpitations/SOB/HA/fever/chills/GI symptoms. Lab Results  Component Value Date   HGBA1C 7.1 (H) 01/15/2022   Past Medical History:  Diagnosis Date    Arthritis    Bradycardia    Cataract    Chronic kidney disease stage III (GFR 30-59 ml/min) 05/03/2012   Colon polyps    Complication of anesthesia    emesis   Diabetes mellitus    GERD (gastroesophageal reflux disease)    HOH (hard of hearing)    HTN (hypertension) 01/13/2013   Hyperlipidemia    Hypertension    Pacemaker 12/2016   Persistent atrial fibrillation (HCC)    Presence of permanent cardiac pacemaker 09/20/2016   Symptomatic bradycardia    Vitamin D deficiency 07/17/2015    Past Surgical History:  Procedure Laterality Date   ANKLE ARTHROPLASTY     BIV PACEMAKER INSERTION CRT-P N/A 06/22/2021   Procedure: UPGRADE TO BIV PACEMAKER INSERTION CRT-P;  Surgeon: Duke Salvia, MD;  Location: [name] INVASIVE CV LAB;  Service: Cardiovascular;  Laterality: N/A;   CARDIOVERSION N/A 08/20/2016   Procedure: CARDIOVERSION;  Surgeon: Thurmon Fair, MD;  Location: [facility] ENDOSCOPY;  Service: Cardiovascular;  Laterality: N/A;   CATARACT EXTRACTION  2010, 2014   CATARACT EXTRACTION  02/28/14   CHOLECYSTECTOMY     CHOLECYSTECTOMY N/A 05/03/2012   Procedure: LAPAROSCOPIC CHOLECYSTECTOMY WITH INTRAOPERATIVE CHOLANGIOGRAM;  Surgeon: Cherylynn Ridges, MD;  Location: [facility] OR;  Service: General;  Laterality: N/A;   INSERT / REPLACE / REMOVE PACEMAKER  09/20/2016   PACEMAKER IMPLANT N/A 09/20/2016   Procedure: Pacemaker Implant;  Surgeon: Duke Salvia, MD;  Location: [facility] INVASIVE CV LAB;  Service: Cardiovascular;  Laterality: N/A;   ROTATOR CUFF REPAIR     SHOULDER SURGERY     TONSILLECTOMY      Family History  Problem Relation Age of Onset  Cancer Mother        colon, breast, pancreas, skin cancer   Heart disease Father        heart valve replaced   Stroke Father    Diabetes Father    Cancer Paternal Grandmother        colon   Obesity Son    Heart disease Son        bradycardia    Social History   Socioeconomic History   Marital status: Widowed    Spouse name: Not on file   Number of  children: Not on file   Years of education: Not on file   Highest education level: Not on file  Occupational History   Not on file  Tobacco Use   Smoking status: Never   Smokeless tobacco: Never  Vaping Use   Vaping Use: Never used  Substance and Sexual Activity   Alcohol use: Yes    Alcohol/week: 1.0 standard drink of alcohol    Types: 1 Glasses of wine per week   Drug use: No   Sexual activity: Not on file    Comment: lives alone , no major dietary restrictions, 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.  Other Topics Concern   Not on file  Social History Narrative   Not on file   Social Determinants of Health   Financial Resource Strain: Low Risk  (02/20/2022)   Overall Financial Resource Strain (CARDIA)    Difficulty of Paying Living Expenses: Not very hard  Food Insecurity: No Food Insecurity (02/20/2022)   Hunger Vital Sign    Worried About Running Out of Food in the Last Year: Never true    Ran Out of Food in the Last Year: Never true  Transportation Needs: No Transportation Needs (02/20/2022)   PRAPARE - Administrator, Civil Service (Medical): No    Lack of Transportation (Non-Medical): No  Physical Activity: Insufficiently Active (02/20/2022)   Exercise Vital Sign    Days of Exercise per Week: 7 days    Minutes of Exercise per Session: 10 min  Stress: No Stress Concern Present (02/20/2022)   [employer] of Occupational Health - Occupational Stress Questionnaire    Feeling of Stress : Not at all  Social Connections: Socially Isolated (02/20/2022)   Social Connection and Isolation Panel [NHANES]    Frequency of Communication with Friends and Family: More than three times a week    Frequency of Social Gatherings with Friends and Family: Three times a week    Attends Religious Services: Never    Active Member of Clubs or Organizations: No    Attends Banker Meetings: Never    Marital Status: Widowed  Intimate Partner  Violence: Not At Risk (02/20/2022)   Humiliation, Afraid, Rape, and Kick questionnaire    Fear of Current or Ex-Partner: No    Emotionally Abused: No    Physically Abused: No    Sexually Abused: No    Outpatient Medications Prior to Visit  Medication Sig Dispense Refill   acetaminophen (TYLENOL) 500 MG tablet Take 500 mg by mouth every 6 (six) hours as needed for moderate pain.     b complex vitamins capsule Take 1 capsule by mouth daily.     ELIQUIS 2.5 MG TABS tablet TAKE 1 TABLET BY MOUTH TWICE A DAY 60 tablet 5   empagliflozin (JARDIANCE) 25 MG TABS tablet Take 25 mg by mouth daily.     ezetimibe (ZETIA) 10 MG tablet Take  1 tablet (10 mg total) by mouth daily. 90 tablet 3   furosemide (LASIX) 40 MG tablet TAKE 1 TABLET BY MOUTH TWICE DAILY FOR 5 DAYS THEN DAILY THEREAFTER 180 tablet 1   glipiZIDE (GLUCOTROL) 5 MG tablet TAKE 1 TABLET BY MOUTH EVERY DAY BEFORE BREAKFAST 90 tablet 1   glucose blood (ONE TOUCH ULTRA TEST) test strip USE 1 STRIP 2 TIMES DAILY TO CHECK BLOOD SUGAR DX E08.22, N18.3 300 each 1   JANUVIA 50 MG tablet TAKE 1 TABLET BY MOUTH EVERY DAY 30 tablet 3   Krill Oil 1000 MG CAPS Take 1,000 mg by mouth daily.     losartan (COZAAR) 25 MG tablet Take 1 tablet (25 mg total) by mouth daily. 90 tablet 3   Multiple Vitamin (MULTIVITAMIN WITH MINERALS) TABS tablet Take 1 tablet by mouth daily.     potassium chloride SA (KLOR-CON M) 20 MEQ tablet Take 10 mEq by mouth daily.     sitaGLIPtin (JANUVIA) 25 MG tablet Take 1 tablet (25 mg total) by mouth daily. 30 tablet 2   Zinc 50 MG TABS Take 50 mg by mouth daily.     No facility-administered medications prior to visit.    Allergies  Allergen Reactions   Atorvastatin     Myalgias / leg pain and weakness   Penicillins Hives and Itching    Has patient had a PCN reaction causing immediate rash, facial/tongue/throat swelling, SOB or lightheadedness with hypotension:Yes Has patient had a PCN reaction causing severe rash involving  mucus membranes or skin necrosis:No Has patient had a PCN reaction that required hospitalization:No Has patient had a PCN reaction occurring within the last 10 years:No If all of the above answers are "NO", then may proceed with Cephalosporin use.    Verapamil     Junctional rhythm   Influenza Vaccines Rash    tinnitus    Review of Systems  Constitutional:  Negative for chills and fever.  Respiratory:  Negative for shortness of breath.   Cardiovascular:  Negative for chest pain and palpitations.  Gastrointestinal:  Negative for abdominal pain, blood in stool, constipation, diarrhea, nausea and vomiting.  Genitourinary:  Positive for frequency and urgency. Negative for dysuria and hematuria.  Skin:           Neurological:  Negative for headaches.       Objective:    Physical Exam Constitutional:      General: He is not in acute distress.    Appearance: Normal appearance. He is not ill-appearing.  HENT:     Head: Normocephalic and atraumatic.     Right Ear: Tympanic membrane, ear canal and external ear normal.     Left Ear: Tympanic membrane, ear canal and external ear normal.     Nose: Nose normal.     Mouth/Throat:     Mouth: Mucous membranes are moist.     Pharynx: Oropharynx is clear.  Eyes:     General:        Right eye: No discharge.        Left eye: No discharge.     Extraocular Movements: Extraocular movements intact.     Right eye: No nystagmus.     Left eye: No nystagmus.     Pupils: Pupils are equal, round, and reactive to light.  Neck:     Vascular: No carotid bruit.  Cardiovascular:     Rate and Rhythm: Normal rate and regular rhythm.     Pulses: Normal pulses.  Heart sounds: Normal heart sounds. No murmur heard.    No gallop.     Comments: There is an occasional skipped heartbeat. Pulmonary:     Effort: Pulmonary effort is normal. No respiratory distress.     Breath sounds: Normal breath sounds. No wheezing or rales.  Abdominal:     General:  Bowel sounds are normal.     Palpations: Abdomen is soft.     Tenderness: There is no abdominal tenderness. There is no guarding.  Musculoskeletal:        General: Normal range of motion.     Cervical back: Normal range of motion.     Right lower leg: No edema.     Left lower leg: No edema.     Comments: Muscle strength 5/5 on upper and lower extremities.   Lymphadenopathy:     Cervical: No cervical adenopathy.  Skin:    General: Skin is warm and dry.  Neurological:     Mental Status: He is alert and oriented to person, place, and time.     Sensory: Sensation is intact.     Motor: Motor function is intact.     Coordination: Coordination is intact.     Deep Tendon Reflexes:     Reflex Scores:      Patellar reflexes are 2+ on the right side and 2+ on the left side. Psychiatric:        Mood and Affect: Mood normal.        Behavior: Behavior normal.        Judgment: Judgment normal.     There were no vitals taken for this visit. Wt Readings from Last 3 Encounters:  03/07/22 193 lb 6.4 oz (87.7 kg)  02/20/22 189 lb (85.7 kg)  01/15/22 189 lb 6.4 oz (85.9 kg)    Diabetic Foot Exam - Simple   No data filed    Lab Results  Component Value Date   WBC 6.2 01/15/2022   HGB 14.4 01/15/2022   HCT 44.9 01/15/2022   PLT 137.0 (L) 01/15/2022   GLUCOSE 261 (H) 01/24/2022   CHOL 149 01/15/2022   TRIG 110.0 01/15/2022   HDL 43.80 01/15/2022   LDLDIRECT 65.0 11/03/2018   LDLCALC 83 01/15/2022   ALT 20 01/24/2022   AST 20 01/24/2022   NA 137 01/24/2022   K 4.4 01/24/2022   CL 104 01/24/2022   CREATININE 1.97 (H) 01/24/2022   BUN 45 (H) 01/24/2022   CO2 24 01/24/2022   TSH 2.23 01/15/2022   PSA 1.18 10/07/2012   INR 1.0 08/15/2016   HGBA1C 7.1 (H) 01/15/2022   MICROALBUR 8.6 (H) 01/15/2022    Lab Results  Component Value Date   TSH 2.23 01/15/2022   Lab Results  Component Value Date   WBC 6.2 01/15/2022   HGB 14.4 01/15/2022   HCT 44.9 01/15/2022   MCV 86.7  01/15/2022   PLT 137.0 (L) 01/15/2022   Lab Results  Component Value Date   NA 137 01/24/2022   K 4.4 01/24/2022   CO2 24 01/24/2022   GLUCOSE 261 (H) 01/24/2022   BUN 45 (H) 01/24/2022   CREATININE 1.97 (H) 01/24/2022   BILITOT 0.6 01/24/2022   ALKPHOS 74 01/24/2022   AST 20 01/24/2022   ALT 20 01/24/2022   PROT 6.5 01/24/2022   ALBUMIN 3.7 01/24/2022   CALCIUM 8.9 01/24/2022   ANIONGAP 10 05/03/2021   EGFR 36.0 06/06/2022   GFR 30.21 (L) 01/24/2022   Lab Results  Component Value  Date   CHOL 149 01/15/2022   Lab Results  Component Value Date   HDL 43.80 01/15/2022   Lab Results  Component Value Date   LDLCALC 83 01/15/2022   Lab Results  Component Value Date   TRIG 110.0 01/15/2022   Lab Results  Component Value Date   CHOLHDL 3 01/15/2022   Lab Results  Component Value Date   HGBA1C 7.1 (H) 01/15/2022      Assessment & Plan:  Colonoscopy: There is no history of colonoscopy. Colonoscopy is not recommended due to patient's age.  DEXA: Last completed on 01/14/2011. - The BMD measured at AP Spine L1-L4 is 1.338 g/cm with a T-score of 0.8 is normal. Fracture risk is low.  - The BMD measured at Femur Neck Left is 0.755 g/cm with a T-score of -2.4 is low. Fracture risk is high. A follow up DEXA test is recommended in one year to monitor response to therapy.  - The BMD measured at Femur Neck Right is 0.882 g/cm with a T-score of -1.4 is considered moderately low. Fracture risk is moderate. Treatment is advised if there are other risk factors.  DEXA scan ordered.  Mammogram: Last completed on 01/03/2009. There is mildly prominent breast tissue noted within the subareolar portion of the right breast.  The appearance is most  consistent with gynecomastia. There is no worrisome mass, distortion, or worrisome calcification. Repeat mammography is not recommended.    PSA:  Last checked on 10/07/2012 at 1.18 ng/mL. Blood work today will recheck PSA.  Advanced Directives:  Encouraged patient to complete advanced care planning documents.  Healthy Lifestyle: Encouraged 6-8 hours of sleep, heart healthy diet, 60-80 oz of non-alcohol/non-caffeinated fluids, and 4000-8000 steps daily.  Immunizations: Encouraged patient to consider annual COVID-19 and Influenza vaccinations as well as Prevnar-20, RSV, Shingles, and Tetanus vaccinations. Patient declines all offered vaccinations today.  Labs: Routine blood work and urinary analysis ordered today.  Chronic Diastolic CHF (congestive heart failure: This is controlled with Eliquis 2.5 mg and Ezetimibe 10 mg. Patient continues to follow with cardiologist Dr. Zoila Shutter who monitors his condition.  Spinal Stenosis of Lumbar Region: Patient has stopped taking Gabapentin, Hydrocodone, Prednisone, and Tramadol. Recommended taking maximum of Acetaminophen 3000 mg in 24 hours.  Vitamin D Deficiency: Blood work today will recheck vitamin D level. Problem List Items Addressed This Visit       Cardiovascular and Mediastinum   Essential hypertension   Chronic diastolic CHF (congestive heart failure) (HCC) - Primary   Permanent atrial fibrillation (HCC)     Other   Hyperlipidemia LDL goal <70 (Chronic)   Vitamin D deficiency   Preventative health care   Morbid obesity (HCC)   No orders of the defined types were placed in this encounter.  I, Vincent Allen, personally preformed the services described in this documentation.  All medical record entries made by the scribe were at my direction and in my presence.  I have reviewed the chart and discharge instructions (if applicable) and agree that the record reflects my personal performance and is accurate and complete. 07/30/2022  I,[name],acting as a scribe for Danise Edge, MD.,have documented all relevant documentation on the behalf of Danise Edge, MD,as directed by  Danise Edge, MD while in the presence of Danise Edge, MD.  Vincent Allen

## 2022-07-30 NOTE — Assessment & Plan Note (Signed)
hgba1c acceptable, minimize simple carbs. Increase exercise as tolerated. Continue current meds 

## 2022-07-30 NOTE — Patient Instructions (Signed)
Shingrix is the new shingles shot, 2 shots over 2-6 months, confirm coverage with insurance and document, then can return here for shots with nurse appt or at pharmacy  RSB, Respiratory Syncitial Virus vaccine, Arexvy at pharmacy  Preventive Care 65 Years and Older, Male Preventive care refers to lifestyle choices and visits with your health care provider that can promote health and wellness. Preventive care visits are also called wellness exams. What can I expect for my preventive care visit? Counseling During your preventive care visit, your health care provider may ask about your: Medical history, including: Past medical problems. Family medical history. History of falls. Current health, including: Emotional well-being. Home life and relationship well-being. Sexual activity. Memory and ability to understand (cognition). Lifestyle, including: Alcohol, nicotine or tobacco, and drug use. Access to firearms. Diet, exercise, and sleep habits. Work and work Astronomer. Sunscreen use. Safety issues such as seatbelt and bike helmet use. Physical exam Your health care provider will check your: Height and weight. These may be used to calculate your BMI (body mass index). BMI is a measurement that tells if you are at a healthy weight. Waist circumference. This measures the distance around your waistline. This measurement also tells if you are at a healthy weight and may help predict your risk of certain diseases, such as type 2 diabetes and high blood pressure. Heart rate and blood pressure. Body temperature. Skin for abnormal spots. What immunizations do I need?  Vaccines are usually given at various ages, according to a schedule. Your health care provider will recommend vaccines for you based on your age, medical history, and lifestyle or other factors, such as travel or where you work. What tests do I need? Screening Your health care provider may recommend screening tests for certain  conditions. This may include: Lipid and cholesterol levels. Diabetes screening. This is done by checking your blood sugar (glucose) after you have not eaten for a while (fasting). Hepatitis C test. Hepatitis B test. HIV (human immunodeficiency virus) test. STI (sexually transmitted infection) testing, if you are at risk. Lung cancer screening. Colorectal cancer screening. Prostate cancer screening. Abdominal aortic aneurysm (AAA) screening. You may need this if you are a current or former smoker. Talk with your health care provider about your test results, treatment options, and if necessary, the need for more tests. Follow these instructions at home: Eating and drinking  Eat a diet that includes fresh fruits and vegetables, whole grains, lean protein, and low-fat dairy products. Limit your intake of foods with high amounts of sugar, saturated fats, and salt. Take vitamin and mineral supplements as recommended by your health care provider. Do not drink alcohol if your health care provider tells you not to drink. If you drink alcohol: Limit how much you have to 0-2 drinks a day. Know how much alcohol is in your drink. In the [place] one drink equals one 12 oz bottle of beer (355 mL), one 5 oz glass of wine (148 mL), or one 1 oz glass of hard liquor (44 mL). Lifestyle Brush your teeth every morning and night with fluoride toothpaste. Floss one time each day. Exercise for at least 30 minutes 5 or more days each week. Do not use any products that contain nicotine or tobacco. These products include cigarettes, chewing tobacco, and vaping devices, such as e-cigarettes. If you need help quitting, ask your health care provider. Do not use drugs. If you are sexually active, practice safe sex. Use a condom or other form of protection to  prevent STIs. Take aspirin only as told by your health care provider. Make sure that you understand how much to take and what form to take. Work with your health care  provider to find out whether it is safe and beneficial for you to take aspirin daily. Ask your health care provider if you need to take a cholesterol-lowering medicine (statin). Find healthy ways to manage stress, such as: Meditation, yoga, or listening to music. Journaling. Talking to a trusted person. Spending time with friends and family. Safety Always wear your seat belt while driving or riding in a vehicle. Do not drive: If you have been drinking alcohol. Do not ride with someone who has been drinking. When you are tired or distracted. While texting. If you have been using any mind-altering substances or drugs. Wear a helmet and other protective equipment during sports activities. If you have firearms in your house, make sure you follow all gun safety procedures. Minimize exposure to UV radiation to reduce your risk of skin cancer. What's next? Visit your health care provider once a year for an annual wellness visit. Ask your health care provider how often you should have your eyes and teeth checked. Stay up to date on all vaccines. This information is not intended to replace advice given to you by your health care provider. Make sure you discuss any questions you have with your health care provider. Document Revised: 08/30/2020 Document Reviewed: 08/30/2020 Elsevier Patient Education  2023 [employer].

## 2022-07-31 ENCOUNTER — Encounter: Payer: Self-pay | Admitting: Family Medicine

## 2022-07-31 DIAGNOSIS — R296 Repeated falls: Secondary | ICD-10-CM | POA: Insufficient documentation

## 2022-07-31 NOTE — Assessment & Plan Note (Signed)
Check dexa scan

## 2022-08-06 ENCOUNTER — Ambulatory Visit (HOSPITAL_BASED_OUTPATIENT_CLINIC_OR_DEPARTMENT_OTHER)
Admission: RE | Admit: 2022-08-06 | Discharge: 2022-08-06 | Disposition: A | Discharge status: Home / Self Care | Payer: PPO | Source: Ambulatory Visit | Attending: Family Medicine | Admitting: Family Medicine

## 2022-08-06 DIAGNOSIS — E119 Type 2 diabetes mellitus without complications: Secondary | ICD-10-CM | POA: Insufficient documentation

## 2022-08-06 DIAGNOSIS — E559 Vitamin D deficiency, unspecified: Secondary | ICD-10-CM | POA: Diagnosis not present

## 2022-08-06 DIAGNOSIS — M8589 Other specified disorders of bone density and structure, multiple sites: Secondary | ICD-10-CM | POA: Insufficient documentation

## 2022-08-06 DIAGNOSIS — Z13820 Encounter for screening for osteoporosis: Secondary | ICD-10-CM | POA: Diagnosis not present

## 2022-08-06 DIAGNOSIS — R296 Repeated falls: Secondary | ICD-10-CM

## 2022-08-06 DIAGNOSIS — M8580 Other specified disorders of bone density and structure, unspecified site: Secondary | ICD-10-CM

## 2022-08-07 ENCOUNTER — Encounter: Payer: Self-pay | Admitting: Pharmacist

## 2022-08-14 DIAGNOSIS — H04123 Dry eye syndrome of bilateral lacrimal glands: Secondary | ICD-10-CM | POA: Diagnosis not present

## 2022-08-14 DIAGNOSIS — E119 Type 2 diabetes mellitus without complications: Secondary | ICD-10-CM | POA: Diagnosis not present

## 2022-08-14 DIAGNOSIS — H353131 Nonexudative age-related macular degeneration, bilateral, early dry stage: Secondary | ICD-10-CM | POA: Diagnosis not present

## 2022-08-14 DIAGNOSIS — H02831 Dermatochalasis of right upper eyelid: Secondary | ICD-10-CM | POA: Diagnosis not present

## 2022-08-14 DIAGNOSIS — Z961 Presence of intraocular lens: Secondary | ICD-10-CM | POA: Diagnosis not present

## 2022-08-14 DIAGNOSIS — H47323 Drusen of optic disc, bilateral: Secondary | ICD-10-CM | POA: Diagnosis not present

## 2022-08-14 DIAGNOSIS — H35371 Puckering of macula, right eye: Secondary | ICD-10-CM | POA: Diagnosis not present

## 2022-08-14 DIAGNOSIS — H02834 Dermatochalasis of left upper eyelid: Secondary | ICD-10-CM | POA: Diagnosis not present

## 2022-08-14 LAB — HM DIABETES EYE EXAM

## 2022-08-22 ENCOUNTER — Encounter: Payer: Self-pay | Admitting: Pharmacist

## 2022-09-02 ENCOUNTER — Telehealth: Payer: Self-pay | Admitting: *Deleted

## 2022-09-02 NOTE — Telephone Encounter (Signed)
Patient with diagnosis of PAF on Eliquis for anticoagulation.    Procedure: LUMBAR  SELECTIVE NERVE ROOT BLOCK   Date of procedure: 09/11/2022   CHA2DS2-VASc Score = 5   This indicates a 7.2% annual risk of stroke. The patient's score is based upon: CHF History: 1 HTN History: 1 Diabetes History: 1 Stroke History: 0 Vascular Disease History: 0 Age Score: 2 Gender Score: 0     CrCl 35 mL/min (SrCr 1.73 07/30/2022) Platelet count 176 K (07/29/2021)    Per office protocol, patient can hold Eliquis for 3 days prior to procedure.     **This guidance is not considered finalized until pre-operative APP has relayed final recommendations.**

## 2022-09-02 NOTE — Telephone Encounter (Signed)
Patient's son returning call. Phone: [phone]

## 2022-09-02 NOTE — Telephone Encounter (Signed)
Left message to call back for in office appt for pre op clearance.  

## 2022-09-02 NOTE — Telephone Encounter (Signed)
I s/w the pt's son Onalee Hua and we have scheduled the pt for an in office appt with Dr. Rennis Golden.   Per Herb Grays RN for Dr. Rennis Golden we could offer appt tomorrow at [facility] location 3:45. Pt's son opts to have pt see MD [facility] location 09/03/22 @ 3:45. Pt's son is grateful for our help and the great teamwork. I will update all parties involved.

## 2022-09-02 NOTE — Telephone Encounter (Signed)
   Pre-operative Risk Assessment    Patient Name: Vincent Allen  DOB: [birth year] MRN: [id number]      Request for Surgical Clearance    Procedure:   LUMBAR  SELECTIVE NERVE ROOT BLOCK  Date of Surgery:  Clearance 09/11/22                                 Surgeon: NOT LISTED Surgeon's Group or Practice Name:  [facility] Phone number:  [phone] ATTN: Karleen Dolphin Fax number:  [phone]   Type of Clearance Requested:   - Medical  - Pharmacy:  Hold Apixaban (Eliquis) x 3 DAYS PRIOR   Type of Anesthesia:  Not Indicated   Additional requests/questions:    Elpidio Anis   09/02/2022, 9:57 AM

## 2022-09-02 NOTE — Telephone Encounter (Signed)
   Name: Vincent Allen  DOB: 1933/08/26  MRN: [id number]  Primary Cardiologist: Chrystie Nose, MD  Chart reviewed as part of pre-operative protocol coverage. Because of Vincent Allen's past medical history and time since last visit, he will require a follow-up in-office visit in order to better assess preoperative cardiovascular risk.Pt is due for follow-up.  Pre-op covering staff: - Please schedule appointment and call patient to inform them. If patient already had an upcoming appointment within acceptable timeframe, please add "pre-op clearance" to the appointment notes so provider is aware. - Please contact requesting surgeon's office via preferred method (i.e, phone, fax) to inform them of need for appointment prior to surgery.  This message will also be routed to pharmacy pool or input on holding Eliquis as requested below so that this information is available to the clearing provider at time of patient's appointment.   Joylene Grapes, NP  09/02/2022, 11:51 AM

## 2022-09-03 ENCOUNTER — Ambulatory Visit (INDEPENDENT_AMBULATORY_CARE_PROVIDER_SITE_OTHER): Payer: PPO | Admitting: Internal Medicine

## 2022-09-03 ENCOUNTER — Encounter (HOSPITAL_BASED_OUTPATIENT_CLINIC_OR_DEPARTMENT_OTHER): Payer: Self-pay | Admitting: Internal Medicine

## 2022-09-03 VITALS — BP 130/70 | HR 75 | Ht 72.0 in | Wt 194.6 lb

## 2022-09-03 DIAGNOSIS — Z950 Presence of cardiac pacemaker: Secondary | ICD-10-CM

## 2022-09-03 DIAGNOSIS — I4821 Permanent atrial fibrillation: Secondary | ICD-10-CM

## 2022-09-03 DIAGNOSIS — Z01810 Encounter for preprocedural cardiovascular examination: Secondary | ICD-10-CM

## 2022-09-03 DIAGNOSIS — I5042 Chronic combined systolic (congestive) and diastolic (congestive) heart failure: Secondary | ICD-10-CM

## 2022-09-03 NOTE — Progress Notes (Unsigned)
OFFICE FOLLOW-UP NOTE  Chief Complaint:  Follow-up CHF, pre-op  Primary Care Physician: Bradd Canary, MD  HPI:  Vincent Allen is a 89 y.o. male with a past medial history significant for atrial fibrillation/flutter with slow ventricular response. Seen recently in the hospital after mechanical fall in the home. He has a history of paroxysmal atrial fibrillation in the past and was seen by Dr. Donnie Aho that was not on any anticoagulation. His CHADSVASC score is 4. He was started on Eliquis 2.5 mg twice a day due to age greater than 80 creatinine greater than 1.5. An echocardiogram was performed which showed normal systolic function, diastolic dysfunction moderate left atrial enlargement. I arranged for outpatient monitoring which shows persistent atrial fibrillation/flutter with controlled ventricular response and at times bradycardia but no clear long pauses or any other indications for pacemaker at this time. He reports being fatigued. He's been an active problem for more than 60 years and is interested in getting back to flying feels that his lack of energy is keeping him back from that.  09/12/2016  Vincent Allen returns for follow-up today. He underwent recent a cardioversion by Dr. Royann Shivers. This was successful and he reported feeling a little better with heart rates in sinus in the 50s for about a week, however then started to feel more fatigued. He's not been on any AV nodal blocking medications. He reports compliance with Eliquis. EKG today shows atrial fibrillation with a marked bradycardic response of 40. Notes that his heart rate will not necessarily increase with any exertion.  10/24/2016  Vincent Allen returns today for follow-up. He underwent successful pacemaker placement by Dr. Graciela Husbands about 3 weeks ago. This was a Medtronic dual-chamber pacemaker. Subsequent pacemaker follow-up shows 100% atrial fibrillation. Pacemaker appears to be working properly. The wound appears to be well-healed. He is  restricted his arm movement now and is interested in more activity. He recently had lab work in advance to upcoming visit with Dr. Rogelia Rohrer. Total cholesterol is 110, Travis was 167, HDL 37 and LDL 39. Metabolic profile was unremarkable with potassium however was slightly low at 3.4 and creatinine 1.45. Blood pressure is elevated today. I reviewed home readings indicating his blood pressure ranges from the about the 140s to 160s systolic over 80s diastolic. Heart rate is been mostly in the 70s and 80s. There is no evidence of rapid atrial fibrillation or bradycardia. He reports improvement in his energy level.  04/28/2017  Vincent Allen returns today for follow-up.  He has had significant improvement in his symptoms after placement of pacemaker.  He still gets a little short of breath but is not been quite as active.  Weight is somewhat better now to 19 and was as high as 225 in November.  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  Blood pressure is well controlled today.  Has had follow-up with Dr. Graciela Husbands who feels the pacemaker is working appropriately.  05/11/2018  Vincent Allen is seen today in annual follow-up.  Overall he is doing well.  He is not been working on his airplane is much recently.  He says that he is worried about falls.  He did sell off a lot of his projects as well as his angina recently.  He denies any chest pain or shortness of breath.  He said no syncopal episodes.  Recent pacemaker interrogation shows a normal pacing and he is ventricular paced today.  He has persistent if not permanent A. fib.  He  is not have any bleeding problems on Eliquis.  Blood pressures been well controlled.  Lab work recently and April 23, 2018 showed total cholesterol 171, HDL 34, triglycerides of 182 and LDL of 100.  His goal LDL should be slightly lower than that I would suspect if we want to be aggressive probably less than 100.  He is not listed to be on statin therapy but previously was on  atorvastatin 20 mg.  He is not sure if he is taking it but again I had him listed on that a year ago.  I would advise if he is come off the medication to restart that otherwise we may need to consider increasing it further.  05/09/2021  Vincent Allen is seen today in follow-up.  He was recently hospitalized and I cared for him for about a week of that hospitalization that actually went more than 2 weeks.  He had symptoms of right heart failure but also had some LV dysfunction with EF around 45%.  He underwent paracentesis with about 5 L of fluid removed.  This was transudative and look more like heart failure.  Subsequently was placed on milrinone and had good color oximetry panels.  He diuresed from 244 pounds down to 201 pounds today which is his new her dry weight.  On discharge his Lasix was increased to 40 mg twice daily.  He is currently taking that in the morning.  He continues to have issues with nocturia and has had BPH for years but is not on treatment for that.  I suspect this is the reason for his nocturnal overflow incontinence.  Blood pressure is good today.  He says he feels well and is ambulating without difficulty.  Oxygen saturation has improved.  He will need repeat labs including metabolic profile and magnesium because of issues with hypokalemia during the hospitalization.  He has a transition of care follow-up with heart failure on February 24.  It was also noted that he had 100% ventricular pacing on recent remote checks.  This could be an etiology of his reduced LV function.  He was seen by Dr. Royann Shivers, who recommended evaluation for CRT-P therapy.  I would then refer Vincent Allen to our electrophysiologist.  10/25/2021  Vincent Allen returns today for follow-up.  He had been referred to Dr. Graciela Husbands for evaluation of possible biventricular pacemaker upgrade.  He did qualify for that and had placement of that earlier this year.  Since then he has had significant improvement in his heart failure symptoms.  Now  NYHA class II.  He has been seen by Dr. Graciela Husbands recently and his primary care provider who increased his diuretic.  He 1 point he was up to 80 mg of Lasix but noted being dizzy on this dose.  He is currently on Lasix 40 mg daily.  He has appeared euvolemic.  Blood pressure is elevated somewhat today.  He is only on Jardiance for heart failure medications.  He does have stage III-IV chronic kidney disease.  GFR around 32.  Recent lipids in July showed total cholesterol 161, HDL 41, triglycerides 90 and LDL 102.  03/08/2022  Vincent Allen is seen today for follow-up.  He is doing well and is accompanied by his son.  He is having no chest pain or worsening shortness of breath.  He does have fairly poor hearing.  His remote pacer checks have been stable.  Lipids have been reasonably well-controlled with LDL of 83.  He is due for refill of his Zetia.  He denies any worsening heart failure symptoms.  EKG shows a ventricular paced rhythm.  Even though he had clinical response to BiV pacing, his LVEF had not really improved.  09/03/2022  Vincent Allen returns today for follow-up heart failure.  Overall he seems to be doing well.  His weight has been stable and has had no worsening edema.  He denies worsening shortness of breath, orthopnea or any heart failure symptoms.  He has had remote pacer checks which are stable.  He is scheduled to have another back injection in the near future and requires clearance for that.  He is already advised to hold his Eliquis 3 days prior to the procedure and start at least 1 to 2 days afterwards.  I do not see any reason why he cannot proceed with injection at this time.  PMHx:  Past Medical History:  Diagnosis Date   Arthritis    Bradycardia    Cataract    Chronic kidney disease stage III (GFR 30-59 ml/min) 05/03/2012   Colon polyps    Complication of anesthesia    emesis   Diabetes mellitus    GERD (gastroesophageal reflux disease)    HOH (hard of hearing)    HTN (hypertension)  01/13/2013   Hyperlipidemia    Hypertension    Pacemaker 12/2016   Persistent atrial fibrillation (HCC)    Presence of permanent cardiac pacemaker 09/20/2016   Symptomatic bradycardia    Vitamin D deficiency 07/17/2015    Past Surgical History:  Procedure Laterality Date   ANKLE ARTHROPLASTY     BIV PACEMAKER INSERTION CRT-P N/A 06/22/2021   Procedure: UPGRADE TO BIV PACEMAKER INSERTION CRT-P;  Surgeon: Duke Salvia, MD;  Location: [facility] INVASIVE CV LAB;  Service: Cardiovascular;  Laterality: N/A;   CARDIOVERSION N/A 08/20/2016   Procedure: CARDIOVERSION;  Surgeon: Thurmon Fair, MD;  Location: [facility] ENDOSCOPY;  Service: Cardiovascular;  Laterality: N/A;   CATARACT EXTRACTION  2010, 2014   CATARACT EXTRACTION  02/28/14   CHOLECYSTECTOMY     CHOLECYSTECTOMY N/A 05/03/2012   Procedure: LAPAROSCOPIC CHOLECYSTECTOMY WITH INTRAOPERATIVE CHOLANGIOGRAM;  Surgeon: Cherylynn Ridges, MD;  Location: [facility] OR;  Service: General;  Laterality: N/A;   INSERT / REPLACE / REMOVE PACEMAKER  09/20/2016   PACEMAKER IMPLANT N/A 09/20/2016   Procedure: Pacemaker Implant;  Surgeon: Duke Salvia, MD;  Location: [facility] INVASIVE CV LAB;  Service: Cardiovascular;  Laterality: N/A;   ROTATOR CUFF REPAIR     SHOULDER SURGERY     TONSILLECTOMY      FAMHx:  Family History  Problem Relation Age of Onset   Cancer Mother        colon, breast, pancreas, skin cancer   Heart disease Father        heart valve replaced   Stroke Father    Diabetes Father    Cancer Paternal Grandmother        colon   Obesity Son    Heart disease Son        bradycardia    SOCHx:   reports that he has never smoked. He has never used smokeless tobacco. He reports current alcohol use of about 1.0 standard drink of alcohol per week. He reports that he does not use drugs.  ALLERGIES:  Allergies  Allergen Reactions   Atorvastatin     Myalgias / leg pain and weakness   Penicillins Hives and Itching    Has patient had a PCN reaction causing  immediate rash, facial/tongue/throat swelling, SOB or lightheadedness with  hypotension:Yes Has patient had a PCN reaction causing severe rash involving mucus membranes or skin necrosis:No Has patient had a PCN reaction that required hospitalization:No Has patient had a PCN reaction occurring within the last 10 years:No If all of the above answers are "NO", then may proceed with Cephalosporin use.    Verapamil     Junctional rhythm   Influenza Vaccines Rash    tinnitus    ROS: Pertinent items noted in HPI and remainder of comprehensive ROS otherwise negative.  HOME MEDS: Current Outpatient Medications on File Prior to Visit  Medication Sig Dispense Refill   acetaminophen (TYLENOL) 500 MG tablet Take 500 mg by mouth every 6 (six) hours as needed for moderate pain.     b complex vitamins capsule Take 1 capsule by mouth daily.     ELIQUIS 2.5 MG TABS tablet TAKE 1 TABLET BY MOUTH TWICE A DAY 60 tablet 5   empagliflozin (JARDIANCE) 25 MG TABS tablet Take 25 mg by mouth daily.     ezetimibe (ZETIA) 10 MG tablet Take 1 tablet (10 mg total) by mouth daily. 90 tablet 3   furosemide (LASIX) 40 MG tablet TAKE 1 TABLET BY MOUTH TWICE DAILY FOR 5 DAYS THEN DAILY THEREAFTER 180 tablet 1   glipiZIDE (GLUCOTROL) 5 MG tablet TAKE 1 TABLET BY MOUTH EVERY DAY BEFORE BREAKFAST 90 tablet 1   glucose blood (ONE TOUCH ULTRA TEST) test strip USE 1 STRIP 2 TIMES DAILY TO CHECK BLOOD SUGAR DX E08.22, N18.3 300 each 1   JANUVIA 50 MG tablet TAKE 1 TABLET BY MOUTH EVERY DAY 30 tablet 3   Krill Oil 1000 MG CAPS Take 1,000 mg by mouth daily.     losartan (COZAAR) 25 MG tablet Take 1 tablet (25 mg total) by mouth daily. 90 tablet 3   Multiple Vitamin (MULTIVITAMIN WITH MINERALS) TABS tablet Take 1 tablet by mouth daily.     potassium chloride SA (KLOR-CON M) 20 MEQ tablet Take 10 mEq by mouth daily.     sitaGLIPtin (JANUVIA) 25 MG tablet Take 1 tablet (25 mg total) by mouth daily. 30 tablet 2   Zinc 50 MG TABS Take  50 mg by mouth daily.     No current facility-administered medications on file prior to visit.    LABS/IMAGING: No results found for this or any previous visit (from the past 48 hour(s)). No results found.  LIPID PANEL:    Component Value Date/Time   CHOL 152 07/30/2022 1133   TRIG 150.0 (H) 07/30/2022 1133   HDL 44.00 07/30/2022 1133   CHOLHDL 3 07/30/2022 1133   VLDL 30.0 07/30/2022 1133   LDLCALC 78 07/30/2022 1133   LDLCALC 101 (H) 12/16/2019 1543   LDLDIRECT 65.0 11/03/2018 1038     WEIGHTS: Wt Readings from Last 3 Encounters:  09/03/22 194 lb 9.6 oz (88.3 kg)  07/30/22 190 lb 9.6 oz (86.5 kg)  03/07/22 193 lb 6.4 oz (87.7 kg)    VITALS: BP 130/70 (BP Location: Left Arm, Patient Position: Sitting, Cuff Size: Normal)   Pulse 75   Ht 6' (1.829 m)   Wt 194 lb 9.6 oz (88.3 kg)   BMI 26.39 kg/m   EXAM: General appearance: alert, no distress, and pale Neck: no carotid bruit, no JVD, and thyroid not enlarged, symmetric, no tenderness/mass/nodules Lungs: clear to auscultation bilaterally Heart: regular rate and rhythm, S1, S2 normal, no murmur, click, rub or gallop and Healing pacemaker site left upper chest Abdomen: soft, non-tender; bowel sounds normal;  no masses,  no organomegaly and mildly protuberant Extremities: edema 1+ bilateral LE Pulses: 2+ and symmetric Skin: Skin color, texture, turgor normal. 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   Mr.  Allen to proceed with lumbar injections.  He should hold his Eliquis at least 3 days prior to procedure and start at least 1 to 2 days afterwards when there is low bleeding risk as per the procedure physician.  From a heart failure standpoint he seems to be stable.  No worsening or decompensated symptoms.  Pacemaker is working appropriately.  No changes to his medicines today.  Plan follow-up with me in 6 months or sooner as necessary.  Chrystie Nose, MD, [facility], FACP  [place]  [facility]  Medical Director of the Advanced Lipid Disorders &  Cardiovascular Risk Reduction Clinic Diplomate of the American Board of Clinical Lipidology Attending Cardiologist  Direct Dial: [phone]  Fax: [phone]  Website:  [email or web address] Annye Forrey 09/03/2022, 4:30 PM

## 2022-09-03 NOTE — Patient Instructions (Signed)
Medication Instructions:  NO CHANGES  *If you need a refill on your cardiac medications before your next appointment, please call your pharmacy*    Follow-Up: At [facility], you and your health needs are our priority.  As part of our continuing mission to provide you with exceptional heart care, we have created designated Provider Care Teams.  These Care Teams include your primary Cardiologist (physician) and Advanced Practice Providers (APPs -  Physician Assistants and Nurse Practitioners) who all work together to provide you with the care you need, when you need it.  We recommend signing up for the patient portal called "MyChart".  Sign up information is provided on this After Visit Summary.  MyChart is used to connect with patients for Virtual Visits (Telemedicine).  Patients are able to view lab/test results, encounter notes, upcoming appointments, etc.  Non-urgent messages can be sent to your provider as well.   To learn more about what you can do with MyChart, go to [email or web address].    Your next appointment:    6 months with Dr. Hilty 

## 2022-09-06 ENCOUNTER — Other Ambulatory Visit: Payer: Self-pay | Admitting: Family Medicine

## 2022-09-06 ENCOUNTER — Other Ambulatory Visit: Payer: Self-pay | Admitting: Internal Medicine

## 2022-09-11 DIAGNOSIS — M5416 Radiculopathy, lumbar region: Secondary | ICD-10-CM | POA: Diagnosis not present

## 2022-09-20 ENCOUNTER — Ambulatory Visit (INDEPENDENT_AMBULATORY_CARE_PROVIDER_SITE_OTHER): Payer: PPO

## 2022-09-20 DIAGNOSIS — I5042 Chronic combined systolic (congestive) and diastolic (congestive) heart failure: Secondary | ICD-10-CM | POA: Diagnosis not present

## 2022-09-20 LAB — CUP PACEART REMOTE DEVICE CHECK
Battery Remaining Longevity: 107 mo
Battery Voltage: 3 V
Brady Statistic RA Percent Paced: 0 %
Brady Statistic RV Percent Paced: 99.42 %
Date Time Interrogation Session: 20240704214016
Implantable Lead Connection Status: 753985
Implantable Lead Connection Status: 753985
Implantable Lead Connection Status: 753985
Implantable Lead Implant Date: 20180706
Implantable Lead Implant Date: 20180706
Implantable Lead Implant Date: 20230407
Implantable Lead Location: 753858
Implantable Lead Location: 753859
Implantable Lead Location: 753860
Implantable Lead Model: 5076
Implantable Lead Model: 5076
Implantable Pulse Generator Implant Date: 20230407
Lead Channel Impedance Value: 1007 Ohm
Lead Channel Impedance Value: 1121 Ohm
Lead Channel Impedance Value: 1140 Ohm
Lead Channel Impedance Value: 1178 Ohm
Lead Channel Impedance Value: 1197 Ohm
Lead Channel Impedance Value: 1292 Ohm
Lead Channel Impedance Value: 323 Ohm
Lead Channel Impedance Value: 380 Ohm
Lead Channel Impedance Value: 418 Ohm
Lead Channel Impedance Value: 456 Ohm
Lead Channel Impedance Value: 551 Ohm
Lead Channel Impedance Value: 608 Ohm
Lead Channel Impedance Value: 722 Ohm
Lead Channel Impedance Value: 741 Ohm
Lead Channel Pacing Threshold Amplitude: 0.875 V
Lead Channel Pacing Threshold Amplitude: 1.375 V
Lead Channel Pacing Threshold Pulse Width: 0.4 ms
Lead Channel Pacing Threshold Pulse Width: 0.8 ms
Lead Channel Sensing Intrinsic Amplitude: 0.875 mV
Lead Channel Sensing Intrinsic Amplitude: 0.875 mV
Lead Channel Sensing Intrinsic Amplitude: 7 mV
Lead Channel Sensing Intrinsic Amplitude: 7 mV
Lead Channel Setting Pacing Amplitude: 2 V
Lead Channel Setting Pacing Amplitude: 2 V
Lead Channel Setting Pacing Pulse Width: 0.4 ms
Lead Channel Setting Pacing Pulse Width: 0.8 ms
Lead Channel Setting Sensing Sensitivity: 1.2 mV
Zone Setting Status: 755011
Zone Setting Status: 755011

## 2022-09-26 DIAGNOSIS — M5451 Vertebrogenic low back pain: Secondary | ICD-10-CM | POA: Diagnosis not present

## 2022-09-26 DIAGNOSIS — M48062 Spinal stenosis, lumbar region with neurogenic claudication: Secondary | ICD-10-CM | POA: Diagnosis not present

## 2022-10-03 DIAGNOSIS — I129 Hypertensive chronic kidney disease with stage 1 through stage 4 chronic kidney disease, or unspecified chronic kidney disease: Secondary | ICD-10-CM | POA: Diagnosis not present

## 2022-10-03 DIAGNOSIS — E1122 Type 2 diabetes mellitus with diabetic chronic kidney disease: Secondary | ICD-10-CM | POA: Diagnosis not present

## 2022-10-03 DIAGNOSIS — I509 Heart failure, unspecified: Secondary | ICD-10-CM | POA: Diagnosis not present

## 2022-10-03 DIAGNOSIS — N1832 Chronic kidney disease, stage 3b: Secondary | ICD-10-CM | POA: Diagnosis not present

## 2022-10-03 DIAGNOSIS — N2581 Secondary hyperparathyroidism of renal origin: Secondary | ICD-10-CM | POA: Diagnosis not present

## 2022-10-03 DIAGNOSIS — I4891 Unspecified atrial fibrillation: Secondary | ICD-10-CM | POA: Diagnosis not present

## 2022-10-08 NOTE — Progress Notes (Signed)
Remote pacemaker transmission.   

## 2022-10-18 ENCOUNTER — Ambulatory Visit: Payer: PPO | Admitting: Podiatry

## 2022-10-18 ENCOUNTER — Encounter: Payer: Self-pay | Admitting: Podiatry

## 2022-10-18 DIAGNOSIS — E0822 Diabetes mellitus due to underlying condition with diabetic chronic kidney disease: Secondary | ICD-10-CM

## 2022-10-18 DIAGNOSIS — N183 Chronic kidney disease, stage 3 unspecified: Secondary | ICD-10-CM

## 2022-10-18 DIAGNOSIS — M79674 Pain in right toe(s): Secondary | ICD-10-CM | POA: Diagnosis not present

## 2022-10-18 DIAGNOSIS — B351 Tinea unguium: Secondary | ICD-10-CM

## 2022-10-18 DIAGNOSIS — M79675 Pain in left toe(s): Secondary | ICD-10-CM

## 2022-10-18 NOTE — Progress Notes (Signed)
This patient returns to my office for at risk foot care.  This patient requires this care by a professional since this patient will be at risk due to having diabetes and coagulation defect.  This patient is unable to cut nails himself since the patient cannot reach his nails.These nails are painful walking and wearing shoes.  This patient presents for at risk foot care today.  General Appearance  Alert, conversant and in no acute stress.  Vascular  Dorsalis pedis and posterior tibial  pulses are weakly  palpable  bilaterally.  Capillary return is within normal limits  bilaterally. Temperature is within normal limits  bilaterally. Venous stasis  B/L.  Neurologic  Senn-Weinstein monofilament wire test within normal limits  bilaterally. Muscle power within normal limits bilaterally.  Nails Thick disfigured discolored nails with subungual debris  from hallux to fifth toes bilaterally. No evidence of bacterial infection or drainage bilaterally.  Orthopedic  No limitations of motion  feet .  No crepitus or effusions noted.  No bony pathology or digital deformities noted.  Skin  normotropic skin with no porokeratosis noted bilaterally.  No signs of infections or ulcers noted.     Onychomycosis  Pain in right toes  Pain in left toes  Consent was obtained for treatment procedures.   Mechanical debridement of nails 1-5  bilaterally performed with a nail nipper.  Filed with dremel without incident.    Return office visit   3 months                   Told patient to return for periodic foot care and evaluation due to potential at risk complications.   Gardiner Barefoot DPM

## 2022-10-21 ENCOUNTER — Telehealth: Payer: Self-pay | Admitting: Family Medicine

## 2022-10-21 NOTE — Telephone Encounter (Signed)
Pt dropped off paperwork for Vincent Allen and Vincent Allen. Placed papers in Vincent Allen's box in front office.

## 2022-10-24 NOTE — Telephone Encounter (Signed)
Paperwork received for Januvia / [employer] medication assistance program.

## 2022-10-25 ENCOUNTER — Other Ambulatory Visit: Payer: Self-pay | Admitting: Family Medicine

## 2022-10-28 ENCOUNTER — Ambulatory Visit (INDEPENDENT_AMBULATORY_CARE_PROVIDER_SITE_OTHER): Payer: PPO | Admitting: Pharmacist

## 2022-10-28 DIAGNOSIS — E0822 Diabetes mellitus due to underlying condition with diabetic chronic kidney disease: Secondary | ICD-10-CM

## 2022-10-28 DIAGNOSIS — N183 Chronic kidney disease, stage 3 unspecified: Secondary | ICD-10-CM

## 2022-10-28 NOTE — Progress Notes (Signed)
Pharmacy Note  10/28/2022 Name: Vincent Allen MRN: [id number] DOB: 1933-09-25  Subjective: Vincent Allen is a 89 y.o. year old male who is a primary care patient of Vincent Canary, MD. Clinical Pharmacist Practitioner referral was placed to assist with medication, diabetes and CHF management.    Engaged with patient's daughter, Vincent Allen by telephone to discuss medication assistance program today.  Type 2 DM: Patient is taking glipizide 5mg  daily, Januvia 25mg  daily and Jardiance 25mg  daily. Last A1c increased slightly from 7.1% to 7.5% ;  Patient's daughter reports he is in Medicare coverage gap and cost of Januvia and [place] is > $100  CKD - over the last year Scr has been stable. Patient was seen at [facility] 02/01/2022 by Dr Vincent Allen. She increased Jardiance from 10mg  daily to 25mg  daily. Scr was last checked 07/30/2022 and showed slight improvement compared to 01/2022  Medication Management: enrolled in 2023 get Jardiance, Januvia and Eliquis from patient assistance program.  In June was referred to Med Assist Team to start applications for these med again but Med Assist Team was not able to reach patient or son.  Family received Eliquis application from [employer] and they have completed patient portion. Dropped off in office last week.    Objective: Review of patient status, including review of consultants reports, laboratory and other test data, was performed as part of comprehensive evaluation and provision of chronic care management services.   Lab Results  Component Value Date   CREATININE 1.73 (H) 07/30/2022   CREATININE 1.97 (H) 01/24/2022   CREATININE 1.65 (H) 01/15/2022    Lab Results  Component Value Date   HGBA1C 7.5 (H) 07/30/2022       Component Value Date/Time   CHOL 152 07/30/2022 1133   TRIG 150.0 (H) 07/30/2022 1133   HDL 44.00 07/30/2022 1133   CHOLHDL 3 07/30/2022 1133   VLDL 30.0 07/30/2022 1133   LDLCALC 78 07/30/2022 1133    LDLCALC 101 (H) 12/16/2019 1543   LDLDIRECT 65.0 11/03/2018 1038     Clinical ASCVD: Yes  The ASCVD Risk score (Arnett DK, et al., 2019) failed to calculate for the following reasons:   The 2019 ASCVD risk score is only valid for ages 9 to 21    BP Readings from Last 3 Encounters:  09/03/22 130/70  07/30/22 130/78  05/16/22 131/69   Blood pressure at nephrology office visit 02/01/2022 was 136/70  Allergies  Allergen Reactions   Atorvastatin     Myalgias / leg pain and weakness   Penicillins Hives and Itching    Has patient had a PCN reaction causing immediate rash, facial/tongue/throat swelling, SOB or lightheadedness with hypotension:Yes Has patient had a PCN reaction causing severe rash involving mucus membranes or skin necrosis:No Has patient had a PCN reaction that required hospitalization:No Has patient had a PCN reaction occurring within the last 10 years:No If all of the above answers are "NO", then may proceed with Cephalosporin use.    Verapamil     Junctional rhythm   Influenza Vaccines Rash    tinnitus    Medications Reviewed Today     Reviewed by Vincent Allen, RPH-CPP (Pharmacist) on 10/28/22 at [phone]  Med List Status: <None>   Medication Order Taking? Sig Documenting Provider Last Dose Status Informant  acetaminophen (TYLENOL) 500 MG tablet [id number] No Take 500 mg by mouth every 6 (six) hours as needed for moderate pain. [provider] Taking Active Child  b complex vitamins  capsule [id number] No Take 1 capsule by mouth daily. [provider] Taking Active   ELIQUIS 2.5 MG TABS tablet [id number] No TAKE 1 TABLET BY MOUTH TWICE A DAY Vincent Canary, MD Taking Active   empagliflozin (JARDIANCE) 25 MG TABS tablet [id number] No Take 25 mg by mouth daily. [provider] Taking Active   ezetimibe (ZETIA) 10 MG tablet [id number] No Take 1 tablet (10 mg total) by mouth daily. Vincent Nose, MD Taking Active   furosemide (LASIX) 40 MG  tablet [id number] No TAKE 1 TABLET BY MOUTH TWICE DAILY FOR 5 DAYS THEN DAILY THEREAFTER Vincent Canary, MD Taking Active   glipiZIDE (GLUCOTROL) 5 MG tablet [id number] No TAKE 1 TABLET BY MOUTH EVERY DAY BEFORE BREAKFAST Vincent Canary, MD Taking Active   glucose blood (ONE TOUCH ULTRA TEST) test strip [id number] No USE 1 STRIP 2 TIMES DAILY TO CHECK BLOOD SUGAR DX E08.22, N18.3 Vincent Canary, MD Taking Active Child  JANUVIA 50 MG tablet [id number]  TAKE 1 TABLET BY MOUTH EVERY DAY Vincent Canary, MD  Active   Krill Oil 1000 MG CAPS [id number] No Take 1,000 mg by mouth daily. [provider] Taking Active Child  losartan (COZAAR) 25 MG tablet [id number]  TAKE 1 TABLET (25 MG TOTAL) BY MOUTH DAILY. Vincent Nose, MD  Active   Multiple Vitamin (MULTIVITAMIN WITH MINERALS) TABS tablet [id number] No Take 1 tablet by mouth daily. [provider] Taking Active Child  potassium chloride SA (KLOR-CON M) 20 MEQ tablet [id number] No Take 10 mEq by mouth daily. [provider] Taking Active            Med Note Vincent Allen,  B   Tue Mar 05, 2022 11:30 AM)    sitaGLIPtin (JANUVIA) 25 MG tablet [id number] No Take 1 tablet (25 mg total) by mouth daily. Vincent Canary, MD Taking Active            Med Note Vincent Allen, [facility] B   Wed Nov 21, 2021 10:59 AM) Medication assistance program approved thru 03/17/2022  Zinc 50 MG TABS [id number] No Take 50 mg by mouth daily. [provider] Taking Active Child            Patient Active Problem List   Diagnosis Date Noted   Recurrent falls 07/31/2022   Permanent atrial fibrillation (HCC) 05/24/2021   Malnutrition of moderate degree 04/27/2021   Unilateral primary osteoarthritis, right knee 02/01/2021   Fatty liver 12/01/2020   Pedal edema 08/14/2020   Knee pain, bilateral 09/20/2019   Overweight 09/14/2019   Urinary frequency 11/06/2016   S/P placement of cardiac pacemaker 10/24/2016   Chronic diastolic CHF (congestive heart  failure) (HCC) 10/24/2016   Cardiac device in situ    Debility 08/15/2016   Vitamin D deficiency 07/17/2015   Preventative health care 07/17/2015   Lumbago 12/02/2014   Pain of toe of left foot 03/14/2014   Essential hypertension 01/13/2013   Insomnia 06/27/2011   Osteopenia 09/02/2010   Family history of colon cancer 09/02/2010   Diabetes mellitus with chronic kidney disease (HCC)    Hyperlipidemia LDL goal <70    CTS (carpal tunnel syndrome) 08/28/2010    Medication Assistance: needs to apply for 2024 - Jardiance, Eliquis and Januvia    Assessment / Plan: Type 2 DM: A1c increased but due to patient age and comorbidities A1c < 7.5% is acceptable.  Emailed applications for [employer] to patient's daughter at her request. Continue jardiance 25mg ,  Januvia 25mg  and glipizide 5mg  daily.   CKD - Scr stable.  Continue with plan to follow up with nephrology. Continue Jardiance to 25mg    Medication Management:  Completed provider portion of Eliquis medication assistance program application. Forwarded to PCP to sign.  Also emailed applications for Januvia and Jardiance to patient's daughter at her request.  Follow Up:  Telephone follow up appointment with care management team member scheduled for:  1 to 2 months   Vincent Allen, PharmD Clinical Pharmacist [facility]  - [facility] [phone]

## 2022-10-30 ENCOUNTER — Telehealth: Payer: Self-pay | Admitting: Family Medicine

## 2022-10-30 NOTE — Telephone Encounter (Signed)
Pt dropped off documents to be filled out by pharmacist ([employer] Cares [facility] -11 pages)) Document put at front office tray under pharmacist tray.

## 2022-10-31 ENCOUNTER — Encounter (INDEPENDENT_AMBULATORY_CARE_PROVIDER_SITE_OTHER): Payer: Self-pay

## 2022-11-04 ENCOUNTER — Encounter: Payer: Self-pay | Admitting: Pharmacist

## 2022-11-04 NOTE — Progress Notes (Signed)
    11/04/2022 Name: Vincent Allen MRN: [id number] DOB: February 02, 1934  Chief Complaint  Patient presents with   Medication Management    Medication access    Vincent Allen is a 89 y.o. year old male  - spoke with his daughter regarding medication assistance program applications.    They were referred to the pharmacist by their PCP for assistance in managing medication access.   Received applications for Eliquis, Jardiance and Januvia medication assistance program.   Current Pharmacy:  [facility] [phone] - [place], [place] - [address] [place] [address] Phone: [phone] Fax: [phone]   Objective:  Lab Results  Component Value Date   HGBA1C 7.5 (H) 07/30/2022    Lab Results  Component Value Date   CREATININE 1.73 (H) 07/30/2022   BUN 38 (H) 07/30/2022   NA 140 07/30/2022   K 4.0 07/30/2022   CL 104 07/30/2022   CO2 25 07/30/2022    Lab Results  Component Value Date   CHOL 152 07/30/2022   HDL 44.00 07/30/2022   LDLCALC 78 07/30/2022   LDLDIRECT 65.0 11/03/2018   TRIG 150.0 (H) 07/30/2022   CHOLHDL 3 07/30/2022    Medications Reviewed Today   Medications were not reviewed in this encounter       Assessment/Plan:  Reviewed medication assistance program applications and added provider information. Forwarded Buford Dresser applications to PCP to sign. Received application back from provider today for Eliquis with signature.  [facility]. Patient has to show that he has spend > 3% of income (or > $660) so far in 2024 on medications. [facility] and confirmed that he has spent > $900 to day. Daughter will pick up report in the next 1 or 2 days and bring to our office.   Henrene Pastor, PharmD Clinical Pharmacist [facility]

## 2022-11-05 ENCOUNTER — Telehealth: Payer: Self-pay | Admitting: Family Medicine

## 2022-11-05 DIAGNOSIS — I4819 Other persistent atrial fibrillation: Secondary | ICD-10-CM

## 2022-11-05 NOTE — Telephone Encounter (Signed)
Dropped off papers

## 2022-11-06 MED ORDER — APIXABAN 2.5 MG PO TABS: 2.5000 mg | ORAL_TABLET | Freq: Two times a day (BID) | ORAL | 1 refills | Status: DC

## 2022-11-06 NOTE — Telephone Encounter (Signed)
Received pharmacy printout for 2024. Faxed pharmacy report and application for Eliquis medication assistance program to [employer] patient assistance [employer]

## 2022-11-20 ENCOUNTER — Ambulatory Visit (INDEPENDENT_AMBULATORY_CARE_PROVIDER_SITE_OTHER): Payer: PPO | Admitting: Pharmacist

## 2022-11-20 DIAGNOSIS — E0822 Diabetes mellitus due to underlying condition with diabetic chronic kidney disease: Secondary | ICD-10-CM

## 2022-11-20 DIAGNOSIS — I4819 Other persistent atrial fibrillation: Secondary | ICD-10-CM

## 2022-11-20 DIAGNOSIS — N183 Chronic kidney disease, stage 3 unspecified: Secondary | ICD-10-CM

## 2022-11-20 NOTE — Progress Notes (Unsigned)
Pharmacy Note  11/20/2022 Name: NIEGEL ZEHNER MRN: [id number] DOB: November 16, 1933  Subjective: Vincent Allen is a 89 y.o. year old male who is a primary care patient of Bradd Canary, MD. Clinical Pharmacist Practitioner referral was placed to assist with medication, diabetes and CHF management.    Engaged with medication assistance program and patient's daughter, [name] by telephone to follow up on medication assistance program applications for Jardiance and Eliquis.  We have also applied for Januvia but that application has to be mailed and has not been processed yet.   Type 2 DM: Patient is taking glipizide 5mg  daily, Januvia 25mg  daily and Jardiance 25mg  daily. Last A1c increased slightly from 7.1% to 7.5% ;  He is in Medicare coverage gap and cost of Januvia and Jardiance is > $100  CKD 3B-  Current therapy: Jardiance 25mg  daily and losartan 25mg  daily. Over the last year Scr has been stable. Patient was seen at [facility] 10/03/2022 by Dr Signe Colt. She changed furosemide to 60mg  / 50mg  alternating daily dose due to bothersome incontinence.  Scr at visit with Dr Signe Colt on 10/03/2022 was 1.77 with eGFR of 37. BUN of 34.  Vitamin D and PTH checked 10/03/2022 were WNL  CHF:  Current therapy: losartan 25mg  daily, Jardiance 25mg  daily, furosemide 40mg  tablet - once daily per med list.  Past therapy: beta blocker - stopped due to hypotension.   Medication Management: enrolled in 2023 get Jardiance, Januvia and Eliquis from patient assistance program.  In June was referred to Med Assist Team to start applications for these med again but Med Assist Team was not able to reach patient or son.  We completed applications last month and they have been forwarded to each respective company. Awaiting response.   Objective: Review of patient status, including review of consultants reports, laboratory and other test data, was performed as part of comprehensive evaluation and provision of chronic  care management services.   Lab Results  Component Value Date   CREATININE 1.73 (H) 07/30/2022   CREATININE 1.97 (H) 01/24/2022   CREATININE 1.65 (H) 01/15/2022    Lab Results  Component Value Date   HGBA1C 7.5 (H) 07/30/2022       Component Value Date/Time   CHOL 152 07/30/2022 1133   TRIG 150.0 (H) 07/30/2022 1133   HDL 44.00 07/30/2022 1133   CHOLHDL 3 07/30/2022 1133   VLDL 30.0 07/30/2022 1133   LDLCALC 78 07/30/2022 1133   LDLCALC 101 (H) 12/16/2019 1543   LDLDIRECT 65.0 11/03/2018 1038     Clinical ASCVD: Yes  The ASCVD Risk score (Arnett DK, et al., 2019) failed to calculate for the following reasons:   The 2019 ASCVD risk score is only valid for ages 70 to 24    BP Readings from Last 3 Encounters:  09/03/22 130/70  07/30/22 130/78  05/16/22 131/69   Blood pressure at nephrology office visit 02/01/2022 was 136/70  Allergies  Allergen Reactions   Atorvastatin     Myalgias / leg pain and weakness   Penicillins Hives and Itching    Has patient had a PCN reaction causing immediate rash, facial/tongue/throat swelling, SOB or lightheadedness with hypotension:Yes Has patient had a PCN reaction causing severe rash involving mucus membranes or skin necrosis:No Has patient had a PCN reaction that required hospitalization:No Has patient had a PCN reaction occurring within the last 10 years:No If all of the above answers are "NO", then may proceed with Cephalosporin use.    Verapamil  Junctional rhythm   Influenza Vaccines Rash    tinnitus    Medications Reviewed Today   Medications were not reviewed in this encounter     Patient Active Problem List   Diagnosis Date Noted   Recurrent falls 07/31/2022   Permanent atrial fibrillation (HCC) 05/24/2021   Malnutrition of moderate degree 04/27/2021   Unilateral primary osteoarthritis, right knee 02/01/2021   Fatty liver 12/01/2020   Pedal edema 08/14/2020   Knee pain, bilateral 09/20/2019   Overweight  09/14/2019   Urinary frequency 11/06/2016   S/P placement of cardiac pacemaker 10/24/2016   Chronic diastolic CHF (congestive heart failure) (HCC) 10/24/2016   Cardiac device in situ    Debility 08/15/2016   Vitamin D deficiency 07/17/2015   Preventative health care 07/17/2015   Lumbago 12/02/2014   Pain of toe of left foot 03/14/2014   Essential hypertension 01/13/2013   Insomnia 06/27/2011   Osteopenia 09/02/2010   Family history of colon cancer 09/02/2010   Diabetes mellitus with chronic kidney disease (HCC)    Hyperlipidemia LDL goal <70    CTS (carpal tunnel syndrome) 08/28/2010    Medication Assistance: Approved to receive Eliquis from [employer] thru 03/18/2023. Confirmed patient has received first shipment.  Medication assistance program for  - Jardiance and Januvia still pending.    Assessment / Plan: Type 2 DM: A1c increased but due to patient age and comorbidities A1c < 7.5% is acceptable.  Continue jardiance 25mg , Januvia 25mg  and glipizide 5mg  daily.  Called [employer] program - they are requiring patient to reapply for LIS for 2024 since his last denial was from 09/2021. (Has to be within the year). Assisted patient and his daughter with on line application for LIS.   CKD - Scr stable.  Continue with plan to follow up with nephrology. Continue Jardiance to 25mg    Medication Management:  Awaiting LIS decision and if denied will fax denial to [employer]. Patient's daughter is aware to watch for letter from [employer].  Awaiting approval for Januvia. [employer] medication assistance program will usually request patient sign attestation. This will arrive in the mail and patient is to sign and mail back to them . Patient's daughter will monitor for arrival in mail.  Follow Up:  Telephone follow up appointment with care management team member scheduled for:  3 weeks to check on LIS application and [employer] application   Henrene Pastor, PharmD Clinical  Pharmacist [facility]  - [facility] [phone]

## 2022-12-01 NOTE — Assessment & Plan Note (Signed)
Encourage heart healthy diet such as MIND or DASH diet, increase exercise, avoid trans fats, simple carbohydrates and processed foods, consider a krill or fish or flaxseed oil cap daily.  °

## 2022-12-01 NOTE — Assessment & Plan Note (Signed)
Well controlled, no changes to meds. Encouraged heart healthy diet such as the DASH diet and exercise as tolerated.  °

## 2022-12-01 NOTE — Assessment & Plan Note (Signed)
Supplement and monitor 

## 2022-12-01 NOTE — Assessment & Plan Note (Signed)
Encouraged DASH or MIND diet, decrease po intake and increase exercise as tolerated. Needs 7-8 hours of sleep nightly. Avoid trans fats, eat small, frequent meals every 4-5 hours with lean proteins, complex carbs and healthy fats. Minimize simple carbs, high fat foods and processed foods 

## 2022-12-01 NOTE — Assessment & Plan Note (Signed)
hgba1c acceptable, minimize simple carbs. Increase exercise as tolerated. Continue current meds 

## 2022-12-01 NOTE — Assessment & Plan Note (Signed)
No recent exacerbation, no changes

## 2022-12-02 ENCOUNTER — Ambulatory Visit (INDEPENDENT_AMBULATORY_CARE_PROVIDER_SITE_OTHER): Payer: PPO | Admitting: Family Medicine

## 2022-12-02 VITALS — BP 126/74 | HR 82 | Temp 98.4°F | Resp 16 | Ht 73.0 in | Wt 187.4 lb

## 2022-12-02 DIAGNOSIS — I5032 Chronic diastolic (congestive) heart failure: Secondary | ICD-10-CM

## 2022-12-02 DIAGNOSIS — E0822 Diabetes mellitus due to underlying condition with diabetic chronic kidney disease: Secondary | ICD-10-CM | POA: Diagnosis not present

## 2022-12-02 DIAGNOSIS — M85852 Other specified disorders of bone density and structure, left thigh: Secondary | ICD-10-CM

## 2022-12-02 DIAGNOSIS — N183 Chronic kidney disease, stage 3 unspecified: Secondary | ICD-10-CM | POA: Diagnosis not present

## 2022-12-02 DIAGNOSIS — E785 Hyperlipidemia, unspecified: Secondary | ICD-10-CM | POA: Diagnosis not present

## 2022-12-02 DIAGNOSIS — E559 Vitamin D deficiency, unspecified: Secondary | ICD-10-CM

## 2022-12-02 DIAGNOSIS — E663 Overweight: Secondary | ICD-10-CM | POA: Diagnosis not present

## 2022-12-02 DIAGNOSIS — M85851 Other specified disorders of bone density and structure, right thigh: Secondary | ICD-10-CM | POA: Diagnosis not present

## 2022-12-02 DIAGNOSIS — I10 Essential (primary) hypertension: Secondary | ICD-10-CM | POA: Diagnosis not present

## 2022-12-02 DIAGNOSIS — I4821 Permanent atrial fibrillation: Secondary | ICD-10-CM

## 2022-12-02 LAB — COMPREHENSIVE METABOLIC PANEL
ALT: 16 U/L (ref 0–53)
AST: 22 U/L (ref 0–37)
Albumin: 3.5 g/dL (ref 3.5–5.2)
Alkaline Phosphatase: 59 U/L (ref 39–117)
BUN: 29 mg/dL — ABNORMAL HIGH (ref 6–23)
CO2: 23 meq/L (ref 19–32)
Calcium: 8.6 mg/dL (ref 8.4–10.5)
Chloride: 104 meq/L (ref 96–112)
Creatinine, Ser: 1.68 mg/dL — ABNORMAL HIGH (ref 0.40–1.50)
GFR: 36.35 mL/min — ABNORMAL LOW (ref >60.00)
Glucose, Bld: 107 mg/dL — ABNORMAL HIGH (ref 70–99)
Potassium: 3.4 meq/L — ABNORMAL LOW (ref 3.5–5.1)
Sodium: 140 meq/L (ref 135–145)
Total Bilirubin: 1 mg/dL (ref 0.2–1.2)
Total Protein: 6.1 g/dL (ref 6.0–8.3)

## 2022-12-02 LAB — LIPID PANEL
Cholesterol: 140 mg/dL (ref 0–200)
HDL: 42.2 mg/dL (ref >39.00)
LDL Cholesterol: 63 mg/dL (ref 0–99)
NonHDL: 98.19
Total CHOL/HDL Ratio: 3
Triglycerides: 177 mg/dL — ABNORMAL HIGH (ref 0.0–149.0)
VLDL: 35.4 mg/dL (ref 0.0–40.0)

## 2022-12-02 LAB — HEMOGLOBIN A1C: Hgb A1c MFr Bld: 7.2 % — ABNORMAL HIGH (ref 4.6–6.5)

## 2022-12-02 LAB — CBC WITH DIFFERENTIAL/PLATELET
Basophils Absolute: 0 10*3/uL (ref 0.0–0.1)
Basophils Relative: 0.4 % (ref 0.0–3.0)
Eosinophils Absolute: 0.2 10*3/uL (ref 0.0–0.7)
Eosinophils Relative: 2.4 % (ref 0.0–5.0)
HCT: 46.5 % (ref 39.0–52.0)
Hemoglobin: 14.6 g/dL (ref 13.0–17.0)
Lymphocytes Relative: 18 % (ref 12.0–46.0)
Lymphs Abs: 1.3 10*3/uL (ref 0.7–4.0)
MCHC: 31.5 g/dL (ref 30.0–36.0)
MCV: 88.4 fl (ref 78.0–100.0)
Monocytes Absolute: 0.8 10*3/uL (ref 0.1–1.0)
Monocytes Relative: 10.5 % (ref 3.0–12.0)
Neutro Abs: 5 10*3/uL (ref 1.4–7.7)
Neutrophils Relative %: 68.7 % (ref 43.0–77.0)
Platelets: 153 10*3/uL (ref 150.0–400.0)
RBC: 5.26 Mil/uL (ref 4.22–5.81)
RDW: 13.5 % (ref 11.5–15.5)
WBC: 7.3 10*3/uL (ref 4.0–10.5)

## 2022-12-02 LAB — TSH: TSH: 3.14 u[IU]/mL (ref 0.35–5.50)

## 2022-12-02 NOTE — Patient Instructions (Addendum)
[Hypertension, Adult High blood pressure (hypertension) is when the force of blood pumping through the arteries is too strong. The arteries are the blood vessels that carry blood from the heart throughout the body. Hypertension forces the heart to work harder to pump blood and may cause arteries to become narrow or stiff. Untreated or uncontrolled hypertension can lead to a heart attack, heart failure, a stroke, kidney disease, and other problems. A blood pressure reading consists of a higher number over a lower number. Ideally, your blood pressure should be below 120/80. The first ("top") number is called the systolic pressure. It is a measure of the pressure in your arteries as your heart beats. The second ("bottom") number is called the diastolic pressure. It is a measure of the pressure in your arteries as the heart relaxes. What are the causes? The exact cause of this condition is not known. There are some conditions that result in high blood pressure. What increases the risk? Certain factors may make you more likely to develop high blood pressure. Some of these risk factors are under your control, including: Smoking. Not getting enough exercise or physical activity. Being overweight. Having too much fat, sugar, calories, or salt (sodium) in your diet. Drinking too much alcohol. Other risk factors include: Having a personal history of heart disease, diabetes, high cholesterol, or kidney disease. Stress. Having a family history of high blood pressure and high cholesterol. Having obstructive sleep apnea. Age. The risk increases with age. What are the signs or symptoms? High blood pressure may not cause symptoms. Very high blood pressure (hypertensive crisis) may cause: Headache. Fast or irregular heartbeats (palpitations). Shortness of breath. Nosebleed. Nausea and vomiting. Vision changes. Severe chest pain, dizziness, and seizures. How is this diagnosed? This condition is diagnosed  by measuring your blood pressure while you are seated, with your arm resting on a flat surface, your legs uncrossed, and your feet flat on the floor. The cuff of the blood pressure monitor will be placed directly against the skin of your upper arm at the level of your heart. Blood pressure should be measured at least twice using the same arm. Certain conditions can cause a difference in blood pressure between your right and left arms. If you have a high blood pressure reading during one visit or you have normal blood pressure with other risk factors, you may be asked to: Return on a different day to have your blood pressure checked again. Monitor your blood pressure at home for 1 week or longer. If you are diagnosed with hypertension, you may have other blood or imaging tests to help your health care provider understand your overall risk for other conditions. How is this treated? This condition is treated by making healthy lifestyle changes, such as eating healthy foods, exercising more, and reducing your alcohol intake. You may be referred for counseling on a healthy diet and physical activity. Your health care provider may prescribe medicine if lifestyle changes are not enough to get your blood pressure under control and if: Your systolic blood pressure is above 130. Your diastolic blood pressure is above 80. Your personal target blood pressure may vary depending on your medical conditions, your age, and other factors. Follow these instructions at home: Eating and drinking  Eat a diet that is high in fiber and potassium, and low in sodium, added sugar, and fat. An example of this eating plan is called the DASH diet. DASH stands for Dietary Approaches to Stop Hypertension. To eat this way: Eat  plenty of fresh fruits and vegetables. Try to fill one half of your plate at each meal with fruits and vegetables. Eat whole grains, such as whole-wheat pasta, brown rice, or whole-grain bread. Fill about one  fourth of your plate with whole grains. Eat or drink low-fat dairy products, such as skim milk or low-fat yogurt. Avoid fatty cuts of meat, processed or cured meats, and poultry with skin. Fill about one fourth of your plate with lean proteins, such as fish, chicken without skin, beans, eggs, or tofu. Avoid pre-made and processed foods. These tend to be higher in sodium, added sugar, and fat. Reduce your daily sodium intake. Many people with hypertension should eat less than 1,500 mg of sodium a day. Do not drink alcohol if: Your health care provider tells you not to drink. You are pregnant, may be pregnant, or are planning to become pregnant. If you drink alcohol: Limit how much you have to: 0-1 drink a day for women. 0-2 drinks a day for men. Know how much alcohol is in your drink. In the [place] one drink equals one 12 oz bottle of beer (355 mL), one 5 oz glass of wine (148 mL), or one 1 oz glass of hard liquor (44 mL). Lifestyle  Work with your health care provider to maintain a healthy body weight or to lose weight. Ask what an ideal weight is for you. Get at least 30 minutes of exercise that causes your heart to beat faster (aerobic exercise) most days of the week. Activities may include walking, swimming, or biking. Include exercise to strengthen your muscles (resistance exercise), such as Pilates or lifting weights, as part of your weekly exercise routine. Try to do these types of exercises for 30 minutes at least 3 days a week. Do not use any products that contain nicotine or tobacco. These products include cigarettes, chewing tobacco, and vaping devices, such as e-cigarettes. If you need help quitting, ask your health care provider. Monitor your blood pressure at home as told by your health care provider. Keep all follow-up visits. This is important. Medicines Take over-the-counter and prescription medicines only as told by your health care provider. Follow directions carefully. Blood  pressure medicines must be taken as prescribed. Do not skip doses of blood pressure medicine. Doing this puts you at risk for problems and can make the medicine less effective. Ask your health care provider about side effects or reactions to medicines that you should watch for. Contact a health care provider if you: Think you are having a reaction to a medicine you are taking. Have headaches that keep coming back (recurring). Feel dizzy. Have swelling in your ankles. Have trouble with your vision. Get help right away if you: Develop a severe headache or confusion. Have unusual weakness or numbness. Feel faint. Have severe pain in your chest or abdomen. Vomit repeatedly. Have trouble breathing. These symptoms may be an emergency. Get help right away. Call 911. Do not wait to see if the symptoms will go away. Do not drive yourself to the hospital. Summary Hypertension is when the force of blood pumping through your arteries is too strong. If this condition is not controlled, it may put you at risk for serious complications. Your personal target blood pressure may vary depending on your medical conditions, your age, and other factors. For most people, a normal blood pressure is less than 120/80. Hypertension is treated with lifestyle changes, medicines, or a combination of both. Lifestyle changes include losing weight, eating a healthy,  low-sodium diet, exercising more, and limiting alcohol. This information is not intended to replace advice given to you by your health care provider. Make sure you discuss any questions you have with your health care provider. Document Revised: 01/09/2021 Document Reviewed: 01/09/2021 Elsevier Patient Education  2024 [employer].

## 2022-12-04 ENCOUNTER — Encounter: Payer: Self-pay | Admitting: Family Medicine

## 2022-12-04 NOTE — Progress Notes (Signed)
Subjective:    Patient ID: Vincent Allen, male    DOB: [birth year], 89 y.o.   MRN: [id number]  Chief Complaint  Patient presents with  . Follow-up    Follow up    HPI Discussed the use of AI scribe software for clinical note transcription with the patient, who gave verbal consent to proceed.  History of Present Illness   The patient, with a history of fluid retention and kidney issues, reports no recent illnesses or ER visits. They mention having trouble urinating, but deny any urinary symptoms such as burning. the patient denies any recent escalation of symptoms such as shortness of breath or chest pain. The patient has been monitoring their diet and fluid intake, and reports that their blood sugar levels have been lower than previous readings. They are under the care of a nephrologist, who has been monitoring their day-to-day glucose levels.        Past Medical History:  Diagnosis Date  . Arthritis   . Bradycardia   . Cataract   . Chronic kidney disease stage III (GFR 30-59 ml/min) 05/03/2012  . Colon polyps   . Complication of anesthesia    emesis  . Diabetes mellitus   . GERD (gastroesophageal reflux disease)   . HOH (hard of hearing)   . HTN (hypertension) 01/13/2013  . Hyperlipidemia   . Hypertension   . Pacemaker 12/2016  . Persistent atrial fibrillation (HCC)   . Presence of permanent cardiac pacemaker 09/20/2016  . Symptomatic bradycardia   . Vitamin D deficiency 07/17/2015    Past Surgical History:  Procedure Laterality Date  . ANKLE ARTHROPLASTY    . BIV PACEMAKER INSERTION CRT-P N/A 06/22/2021   Procedure: UPGRADE TO BIV PACEMAKER INSERTION CRT-P;  Surgeon: Duke Salvia, MD;  Location: [facility] INVASIVE CV LAB;  Service: Cardiovascular;  Laterality: N/A;  . CARDIOVERSION N/A 08/20/2016   Procedure: CARDIOVERSION;  Surgeon: Thurmon Fair, MD;  Location: [facility] ENDOSCOPY;  Service: Cardiovascular;  Laterality: N/A;  . CATARACT EXTRACTION  2010, 2014  . CATARACT  EXTRACTION  02/28/14  . CHOLECYSTECTOMY    . CHOLECYSTECTOMY N/A 05/03/2012   Procedure: LAPAROSCOPIC CHOLECYSTECTOMY WITH INTRAOPERATIVE CHOLANGIOGRAM;  Surgeon: Cherylynn Ridges, MD;  Location: [facility] OR;  Service: General;  Laterality: N/A;  . INSERT / REPLACE / REMOVE PACEMAKER  09/20/2016  . PACEMAKER IMPLANT N/A 09/20/2016   Procedure: Pacemaker Implant;  Surgeon: Duke Salvia, MD;  Location: [facility] INVASIVE CV LAB;  Service: Cardiovascular;  Laterality: N/A;  . ROTATOR CUFF REPAIR    . SHOULDER SURGERY    . TONSILLECTOMY      Family History  Problem Relation Age of Onset  . Cancer Mother        colon, breast, pancreas, skin cancer  . Heart disease Father        heart valve replaced  . Stroke Father   . Diabetes Father   . Cancer Paternal Grandmother        colon  . Obesity Son   . Heart disease Son        bradycardia    Social History   Socioeconomic History  . Marital status: Widowed    Spouse name: Not on file  . Number of children: Not on file  . Years of education: Not on file  . Highest education level: Not on file  Occupational History  . Not on file  Tobacco Use  . Smoking status: Never  . Smokeless tobacco: Never  Vaping Use  .  Vaping status: Never Used  Substance and Sexual Activity  . Alcohol use: Yes    Alcohol/week: 1.0 standard drink of alcohol    Types: 1 Glasses of wine per week  . Drug use: No  . 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.  Other Topics Concern  . Not on file  Social History Narrative  . Not on file   Social Determinants of Health   Financial Resource Strain: Low Risk  (02/20/2022)   Overall Financial Resource Strain (CARDIA)   . Difficulty of Paying Living Expenses: Not very hard  Food Insecurity: No Food Insecurity (02/20/2022)   Hunger Vital Sign   . Worried About Programme researcher, broadcasting/film/video in the Last Year: Never true   . Ran Out  of Food in the Last Year: Never true  Transportation Needs: No Transportation Needs (02/20/2022)   PRAPARE - Transportation   . Lack of Transportation (Medical): No   . Lack of Transportation (Non-Medical): No  Physical Activity: Insufficiently Active (02/20/2022)   Exercise Vital Sign   . Days of Exercise per Week: 7 days   . Minutes of Exercise per Session: 10 min  Stress: No Stress Concern Present (02/20/2022)   [employer] of Occupational Health - Occupational Stress Questionnaire   . Feeling of Stress : Not at all  Social Connections: Socially Isolated (02/20/2022)   Social Connection and Isolation Panel [NHANES]   . Frequency of Communication with Friends and Family: More than three times a week   . Frequency of Social Gatherings with Friends and Family: Three times a week   . Attends Religious Services: Never   . Active Member of Clubs or Organizations: No   . Attends Banker Meetings: Never   . Marital Status: Widowed  Intimate Partner Violence: Not At Risk (02/20/2022)   Humiliation, Afraid, Rape, and Kick questionnaire   . Fear of Current or Ex-Partner: No   . Emotionally Abused: No   . Physically Abused: No   . Sexually Abused: No    Outpatient Medications Prior to Visit  Medication Sig Dispense Refill  . acetaminophen (TYLENOL) 500 MG tablet Take 500 mg by mouth every 6 (six) hours as needed for moderate pain.    Marland Kitchen apixaban (ELIQUIS) 2.5 MG TABS tablet Take 1 tablet (2.5 mg total) by mouth 2 (two) times daily. 180 tablet 1  . b complex vitamins capsule Take 1 capsule by mouth daily.    . empagliflozin (JARDIANCE) 25 MG TABS tablet Take 25 mg by mouth daily.    Marland Kitchen ezetimibe (ZETIA) 10 MG tablet Take 1 tablet (10 mg total) by mouth daily. 90 tablet 3  . furosemide (LASIX) 40 MG tablet TAKE 1 TABLET BY MOUTH TWICE DAILY FOR 5 DAYS THEN DAILY THEREAFTER 180 tablet 1  . glipiZIDE (GLUCOTROL) 5 MG tablet TAKE 1 TABLET BY MOUTH EVERY DAY BEFORE BREAKFAST 90  tablet 1  . glucose blood (ONE TOUCH ULTRA TEST) test strip USE 1 STRIP 2 TIMES DAILY TO CHECK BLOOD SUGAR DX E08.22, N18.3 300 each 1  . JANUVIA 50 MG tablet TAKE 1 TABLET BY MOUTH EVERY DAY 30 tablet 3  . Krill Oil 1000 MG CAPS Take 1,000 mg by mouth daily.    Marland Kitchen losartan (COZAAR) 25 MG tablet TAKE 1 TABLET (25 MG TOTAL) BY MOUTH DAILY. 90 tablet 3  . Multiple Vitamin (MULTIVITAMIN WITH MINERALS) TABS  tablet Take 1 tablet by mouth daily.    . potassium chloride SA (KLOR-CON M) 20 MEQ tablet Take 10 mEq by mouth daily.    . sitaGLIPtin (JANUVIA) 25 MG tablet Take 1 tablet (25 mg total) by mouth daily. 30 tablet 2  . Zinc 50 MG TABS Take 50 mg by mouth daily.     No facility-administered medications prior to visit.    Allergies  Allergen Reactions  . Atorvastatin     Myalgias / leg pain and weakness  . Penicillins Hives and Itching    Has patient had a PCN reaction causing immediate rash, facial/tongue/throat swelling, SOB or lightheadedness with hypotension:Yes Has patient had a PCN reaction causing severe rash involving mucus membranes or skin necrosis:No Has patient had a PCN reaction that required hospitalization:No Has patient had a PCN reaction occurring within the last 10 years:No If all of the above answers are "NO", then may proceed with Cephalosporin use.   . Verapamil     Junctional rhythm  . Influenza Vaccines Rash    tinnitus    Review of Systems  Constitutional:  Negative for fever and malaise/fatigue.  HENT:  Negative for congestion.   Eyes:  Negative for blurred vision.  Respiratory:  Negative for shortness of breath.   Cardiovascular:  Negative for chest pain, palpitations and leg swelling.  Gastrointestinal:  Negative for abdominal pain, blood in stool and nausea.  Genitourinary:  Negative for dysuria and frequency.  Musculoskeletal:  Negative for falls.  Skin:  Negative for rash.  Neurological:  Negative for dizziness, loss of consciousness and headaches.   Endo/Heme/Allergies:  Negative for environmental allergies.  Psychiatric/Behavioral:  Negative for depression. The patient is not nervous/anxious.       Objective:    Physical Exam Vitals reviewed.  Constitutional:      Appearance: Normal appearance. He is not ill-appearing.  HENT:     Head: Normocephalic and atraumatic.     Nose: Nose normal.  Eyes:     Conjunctiva/sclera: Conjunctivae normal.  Cardiovascular:     Rate and Rhythm: Normal rate. Rhythm irregular.     Pulses: Normal pulses.     Heart sounds: Murmur heard.  Pulmonary:     Effort: Pulmonary effort is normal.     Breath sounds: Normal breath sounds. No wheezing.  Abdominal:     Palpations: Abdomen is soft. There is no mass.     Tenderness: There is no abdominal tenderness.  Musculoskeletal:     Cervical back: Normal range of motion.     Right lower leg: No edema.     Left lower leg: No edema.  Skin:    General: Skin is warm and dry.  Neurological:     General: No focal deficit present.     Mental Status: He is alert and oriented to person, place, and time.  Psychiatric:        Mood and Affect: Mood normal.   BP 126/74 (BP Location: Left Arm, Patient Position: Sitting, Cuff Size: Normal)   Pulse 82   Temp 98.4 F (36.9 C) (Oral)   Resp 16   Ht 6\' 1"  (1.854 m)   Wt 187 lb 6.4 oz (85 kg)   SpO2 95%   BMI 24.72 kg/m  Wt Readings from Last 3 Encounters:  12/02/22 187 lb 6.4 oz (85 kg)  09/03/22 194 lb 9.6 oz (88.3 kg)  07/30/22 190 lb 9.6 oz (86.5 kg)    Diabetic Foot Exam - Simple   No data  filed    Lab Results  Component Value Date   WBC 7.3 12/02/2022   HGB 14.6 12/02/2022   HCT 46.5 12/02/2022   PLT 153.0 12/02/2022   GLUCOSE 107 (H) 12/02/2022   CHOL 140 12/02/2022   TRIG 177.0 (H) 12/02/2022   HDL 42.20 12/02/2022   LDLDIRECT 65.0 11/03/2018   LDLCALC 63 12/02/2022   ALT 16 12/02/2022   AST 22 12/02/2022   NA 140 12/02/2022   K 3.4 (L) 12/02/2022   CL 104 12/02/2022   CREATININE  1.68 (H) 12/02/2022   BUN 29 (H) 12/02/2022   CO2 23 12/02/2022   TSH 3.14 12/02/2022   PSA 1.18 10/07/2012   INR 1.0 08/15/2016   HGBA1C 7.2 (H) 12/02/2022   MICROALBUR 8.6 (H) 01/15/2022    Lab Results  Component Value Date   TSH 3.14 12/02/2022   Lab Results  Component Value Date   WBC 7.3 12/02/2022   HGB 14.6 12/02/2022   HCT 46.5 12/02/2022   MCV 88.4 12/02/2022   PLT 153.0 12/02/2022   Lab Results  Component Value Date   NA 140 12/02/2022   K 3.4 (L) 12/02/2022   CO2 23 12/02/2022   GLUCOSE 107 (H) 12/02/2022   BUN 29 (H) 12/02/2022   CREATININE 1.68 (H) 12/02/2022   BILITOT 1.0 12/02/2022   ALKPHOS 59 12/02/2022   AST 22 12/02/2022   ALT 16 12/02/2022   PROT 6.1 12/02/2022   ALBUMIN 3.5 12/02/2022   CALCIUM 8.6 12/02/2022   ANIONGAP 10 05/03/2021   EGFR 37.0 10/04/2022   GFR 36.35 (L) 12/02/2022   Lab Results  Component Value Date   CHOL 140 12/02/2022   Lab Results  Component Value Date   HDL 42.20 12/02/2022   Lab Results  Component Value Date   LDLCALC 63 12/02/2022   Lab Results  Component Value Date   TRIG 177.0 (H) 12/02/2022   Lab Results  Component Value Date   CHOLHDL 3 12/02/2022   Lab Results  Component Value Date   HGBA1C 7.2 (H) 12/02/2022       Assessment & Plan:  Chronic diastolic CHF (congestive heart failure) (HCC) Assessment & Plan: No recent exacerbation, no changes   Diabetes mellitus due to underlying condition with stage 3 chronic kidney disease, unspecified whether long term insulin use, unspecified whether stage 3a or 3b CKD (HCC) Assessment & Plan: hgba1c acceptable, minimize simple carbs. Increase exercise as tolerated. Continue current meds   Orders: -     Comprehensive metabolic panel -     Hemoglobin A1c  Essential hypertension Assessment & Plan: Well controlled, no changes to meds. Encouraged heart healthy diet such as the DASH diet and exercise as tolerated.    Orders: -     CBC with  Differential/Platelet -     TSH  Hyperlipidemia LDL goal <70 Assessment & Plan: Encourage heart healthy diet such as MIND or DASH diet, increase exercise, avoid trans fats, simple carbohydrates and processed foods, consider a krill or fish or flaxseed oil cap daily.   Orders: -     Lipid panel  Overweight Assessment & Plan: Encouraged DASH or MIND diet, decrease po intake and increase exercise as tolerated. Needs 7-8 hours of sleep nightly. Avoid trans fats, eat small, frequent meals every 4-5 hours with lean proteins, complex carbs and healthy fats. Minimize simple carbs, high fat foods and processed foods    Vitamin D deficiency Assessment & Plan: Supplement and monitor    Permanent atrial fibrillation (  HCC) Assessment & Plan: Rate controlled, tolerating meds   Osteopenia of necks of both femurs Assessment & Plan: Encouraged to get adequate exercise, calcium and vitamin d intake      Assessment and Plan    Diabetes Mellitus Patient's blood glucose levels are being monitored by [facility]. No recent A1C results available. Patient reports improved dietary habits. -Order A1C today to assess glycemic control.  Chronic Kidney Disease Last creatinine was 1.73. Patient reports difficulty with urination, but no other symptoms suggestive of fluid overload. -Repeat metabolic panel today to assess kidney function. -Advise patient to continue fluid intake and low sodium diet.  General Health Maintenance -Next appointment scheduled for early January 2025. -Declined flu shot.         Danise Edge, MD

## 2022-12-04 NOTE — Assessment & Plan Note (Signed)
Encouraged to get adequate exercise, calcium and vitamin d intake

## 2022-12-04 NOTE — Assessment & Plan Note (Signed)
Rate controlled, tolerating meds

## 2022-12-08 ENCOUNTER — Other Ambulatory Visit: Payer: Self-pay | Admitting: Family Medicine

## 2022-12-11 ENCOUNTER — Ambulatory Visit (INDEPENDENT_AMBULATORY_CARE_PROVIDER_SITE_OTHER): Payer: PPO | Admitting: Pharmacist

## 2022-12-11 ENCOUNTER — Telehealth: Payer: Self-pay | Admitting: Family Medicine

## 2022-12-11 ENCOUNTER — Other Ambulatory Visit: Payer: Self-pay | Admitting: Family Medicine

## 2022-12-11 DIAGNOSIS — E0822 Diabetes mellitus due to underlying condition with diabetic chronic kidney disease: Secondary | ICD-10-CM

## 2022-12-11 DIAGNOSIS — N183 Chronic kidney disease, stage 3 unspecified: Secondary | ICD-10-CM

## 2022-12-11 DIAGNOSIS — I5032 Chronic diastolic (congestive) heart failure: Secondary | ICD-10-CM

## 2022-12-11 NOTE — Telephone Encounter (Signed)
Pt dropped of papers . Placed papers in your tray in front office.

## 2022-12-11 NOTE — Progress Notes (Signed)
Pharmacy Note  12/11/2022 Name: Vincent Allen MRN: [id number] DOB: 1933/05/28  Subjective: Vincent Allen is a 89 y.o. year old male who is a primary care patient of Bradd Canary, MD. Clinical Pharmacist Practitioner referral was placed to assist with medication, diabetes and CHF management.    Engaged with medication assistance program by telephone to follow up on medication assistance program applications for Jardiance and Januvia  Type 2 DM: Patient is taking glipizide 5mg  daily, Januvia 25mg  daily and Jardiance 25mg  daily. Last A1c improved slightly from 7.5% to 7.2% ;  He is in Medicare coverage gap and cost of Januvia and Jardiance is > $100  CKD 3B-  Current therapy: Jardiance 25mg  daily and losartan 25mg  daily. Over the last year Scr has been stable.  Followed by [facility]. Last appointment was 10/03/2022 with Dr Signe Colt. She changed furosemide to 60mg  / 50mg  alternating daily dose due to bothersome incontinence.  Scr at visit with Dr Signe Colt on 10/03/2022 was 1.77 with eGFR of 37. BUN of 34. Last Scr was 1.68 when checked by Dr Abner Greenspan 12/02/2022 Vitamin D and PTH checked 10/03/2022 were WNL  CHF:  Current therapy: losartan 25mg  daily, Jardiance 25mg  daily, furosemide 40mg  tablet - once daily per med list.  Past therapy: beta blocker - stopped due to hypotension.   Medication Management: enrolled in 2023 get Jardiance, Januvia and Eliquis from patient assistance program. He has been mailed  applications to reapply in early 2024 but did not complete. In June 2024 he was referred to Med Assist Team to start applications for these med again but Med Assist Team was not able to reach patient or son.   After patient's daughter reached out, assisted in completing applications in August.  He has been approved thru 03/18/2023 for medication assistance program thru [employer] program and has received Eliquis.    Objective: Review of patient status, including review of consultants  reports, laboratory and other test data, was performed as part of comprehensive evaluation and provision of chronic care management services.   Lab Results  Component Value Date   CREATININE 1.68 (H) 12/02/2022   CREATININE 1.73 (H) 07/30/2022   CREATININE 1.97 (H) 01/24/2022    Lab Results  Component Value Date   HGBA1C 7.2 (H) 12/02/2022       Component Value Date/Time   CHOL 140 12/02/2022 1015   TRIG 177.0 (H) 12/02/2022 1015   HDL 42.20 12/02/2022 1015   CHOLHDL 3 12/02/2022 1015   VLDL 35.4 12/02/2022 1015   LDLCALC 63 12/02/2022 1015   LDLCALC 101 (H) 12/16/2019 1543   LDLDIRECT 65.0 11/03/2018 1038     Clinical ASCVD: Yes  The ASCVD Risk score (Arnett DK, et al., 2019) failed to calculate for the following reasons:   The 2019 ASCVD risk score is only valid for ages 47 to 63    BP Readings from Last 3 Encounters:  12/02/22 126/74  09/03/22 130/70  07/30/22 130/78   Blood pressure at nephrology office visit 02/01/2022 was 136/70  Allergies  Allergen Reactions   Atorvastatin     Myalgias / leg pain and weakness   Penicillins Hives and Itching    Has patient had a PCN reaction causing immediate rash, facial/tongue/throat swelling, SOB or lightheadedness with hypotension:Yes Has patient had a PCN reaction causing severe rash involving mucus membranes or skin necrosis:No Has patient had a PCN reaction that required hospitalization:No Has patient had a PCN reaction occurring within the last 10 years:No If  all of the above answers are "NO", then may proceed with Cephalosporin use.    Verapamil     Junctional rhythm   Influenza Vaccines Rash    tinnitus    Medications Reviewed Today     Reviewed by Henrene Pastor, RPH-CPP (Pharmacist) on 12/11/22 at 0908  Med List Status: <None>   Medication Order Taking? Sig Documenting Provider Last Dose Status Informant  acetaminophen (TYLENOL) 500 MG tablet [id number] No Take 500 mg by mouth every 6 (six) hours as needed  for moderate pain. [provider] Taking Active Child  apixaban (ELIQUIS) 2.5 MG TABS tablet [id number] No Take 1 tablet (2.5 mg total) by mouth 2 (two) times daily. Bradd Canary, MD Taking Active            Med Note Clydie Braun, Glenna Durand   Wed Dec 11, 2022  8:06 AM) Medication assistance program thru 03/18/2023  b complex vitamins capsule [id number] No Take 1 capsule by mouth daily. [provider] Taking Active   empagliflozin (JARDIANCE) 25 MG TABS tablet [id number] No Take 25 mg by mouth daily. [provider] Taking Active   ezetimibe (ZETIA) 10 MG tablet [id number] No Take 1 tablet (10 mg total) by mouth daily. Chrystie Nose, MD Taking Active   furosemide (LASIX) 40 MG tablet [id number] No TAKE 1 TABLET BY MOUTH TWICE DAILY FOR 5 DAYS THEN DAILY THEREAFTER Bradd Canary, MD Taking Active   glipiZIDE (GLUCOTROL) 5 MG tablet [id number]  TAKE 1 TABLET BY MOUTH EVERY DAY BEFORE BREAKFAST Bradd Canary, MD  Active   glucose blood (ONE TOUCH ULTRA TEST) test strip [id number] No USE 1 STRIP 2 TIMES DAILY TO CHECK BLOOD SUGAR DX E08.22, N18.3 Bradd Canary, MD Taking Active Child  Krill Oil 1000 MG CAPS [id number] No Take 1,000 mg by mouth daily. [provider] Taking Active Child  losartan (COZAAR) 25 MG tablet [id number] No TAKE 1 TABLET (25 MG TOTAL) BY MOUTH DAILY. Chrystie Nose, MD Taking Active   Multiple Vitamin (MULTIVITAMIN WITH MINERALS) TABS tablet [id number] No Take 1 tablet by mouth daily. [provider] Taking Active Child  potassium chloride SA (KLOR-CON M) 20 MEQ tablet [id number] No Take 10 mEq by mouth daily. [provider] Taking Active            Med Note Clydie Braun, Kearie Mennen B   Tue Mar 05, 2022 11:30 AM)    sitaGLIPtin (JANUVIA) 25 MG tablet [id number] No Take 1 tablet (25 mg total) by mouth daily. Bradd Canary, MD Taking Active            Med Note Clydie Braun, Juanna Cao Dec 11, 2022  8:15 AM) Medication assistance program  approved 12/06/22 thru 03/18/23  Zinc 50 MG TABS [id number] No Take 50 mg by mouth daily. [provider] Taking Active Child            Patient Active Problem List   Diagnosis Date Noted   Recurrent falls 07/31/2022   Permanent atrial fibrillation (HCC) 05/24/2021   Malnutrition of moderate degree 04/27/2021   Unilateral primary osteoarthritis, right knee 02/01/2021   Fatty liver 12/01/2020   Pedal edema 08/14/2020   Knee pain, bilateral 09/20/2019   Overweight 09/14/2019   Urinary frequency 11/06/2016   S/P placement of cardiac pacemaker 10/24/2016   Chronic diastolic CHF (congestive heart failure) (HCC) 10/24/2016   Cardiac device in situ    Debility 08/15/2016   Vitamin D deficiency 07/17/2015  Preventative health care 07/17/2015   Lumbago 12/02/2014   Pain of toe of left foot 03/14/2014   Essential hypertension 01/13/2013   Insomnia 06/27/2011   Osteopenia 09/02/2010   Family history of colon cancer 09/02/2010   Diabetes mellitus with chronic kidney disease (HCC)    Hyperlipidemia LDL goal <70    CTS (carpal tunnel syndrome) 08/28/2010    Medication Assistance: Approved to receive Eliquis from [facility] thru 03/18/2023. Confirmed patient has received first shipment.  Medication assistance program for  - Jardiance and Januvia still pending.    Assessment / Plan: Type 2 DM: A1c at goal; due to patient age and comorbidities A1c < 7.5% is acceptable.  Continue jardiance 25mg , Januvia 25mg  and glipizide 5mg  daily.  Called [employer] to check on [employer] - patient was approved 12/06/2022 and should received first shipment of Januvia 12/12/2022. Patient's daughter notified.  Still waiting on LIS / extra help letter that is required by [facility] program - they are required patient to reapply for LIS for 2024 since his last denial was from 09/2021. (Has to be within the year). Daughter reminded to provide letter to our office once received.    CKD - Scr  stable.  Continue with plan to follow up with nephrology. Continue Jardiance to 25mg    Medication Management:  Awaiting LIS decision and if denied will fax denial to [facility]. Patient's daughter is aware to watch for letter from [employer].   Follow Up:  Telephone follow up appointment with care management team member scheduled for:  3 weeks to check on LIS application.    Henrene Pastor, PharmD Clinical Pharmacist [facility]  - [facility] [phone]

## 2022-12-11 NOTE — Telephone Encounter (Signed)
Faxed LIS / Extra help denial letter to [facility] Care patient assistance program.

## 2022-12-19 DIAGNOSIS — L570 Actinic keratosis: Secondary | ICD-10-CM | POA: Diagnosis not present

## 2022-12-19 DIAGNOSIS — L821 Other seborrheic keratosis: Secondary | ICD-10-CM | POA: Diagnosis not present

## 2022-12-19 DIAGNOSIS — L820 Inflamed seborrheic keratosis: Secondary | ICD-10-CM | POA: Diagnosis not present

## 2022-12-19 DIAGNOSIS — C44629 Squamous cell carcinoma of skin of left upper limb, including shoulder: Secondary | ICD-10-CM | POA: Diagnosis not present

## 2022-12-19 DIAGNOSIS — D485 Neoplasm of uncertain behavior of skin: Secondary | ICD-10-CM | POA: Diagnosis not present

## 2022-12-19 DIAGNOSIS — Z85828 Personal history of other malignant neoplasm of skin: Secondary | ICD-10-CM | POA: Diagnosis not present

## 2022-12-19 DIAGNOSIS — D0462 Carcinoma in situ of skin of left upper limb, including shoulder: Secondary | ICD-10-CM | POA: Diagnosis not present

## 2022-12-19 DIAGNOSIS — D044 Carcinoma in situ of skin of scalp and neck: Secondary | ICD-10-CM | POA: Diagnosis not present

## 2022-12-23 ENCOUNTER — Ambulatory Visit: Payer: PPO

## 2022-12-23 DIAGNOSIS — I4819 Other persistent atrial fibrillation: Secondary | ICD-10-CM

## 2022-12-24 LAB — CUP PACEART REMOTE DEVICE CHECK
Battery Remaining Longevity: 103 mo
Battery Voltage: 2.99 V
Brady Statistic RA Percent Paced: 0 %
Brady Statistic RV Percent Paced: 99.63 %
Date Time Interrogation Session: 20241007080629
Implantable Lead Connection Status: 753985
Implantable Lead Connection Status: 753985
Implantable Lead Connection Status: 753985
Implantable Lead Implant Date: 20180706
Implantable Lead Implant Date: 20180706
Implantable Lead Implant Date: 20230407
Implantable Lead Location: 753858
Implantable Lead Location: 753859
Implantable Lead Location: 753860
Implantable Lead Model: 5076
Implantable Lead Model: 5076
Implantable Pulse Generator Implant Date: 20230407
Lead Channel Impedance Value: 1045 Ohm
Lead Channel Impedance Value: 1121 Ohm
Lead Channel Impedance Value: 1121 Ohm
Lead Channel Impedance Value: 1254 Ohm
Lead Channel Impedance Value: 1273 Ohm
Lead Channel Impedance Value: 1311 Ohm
Lead Channel Impedance Value: 304 Ohm
Lead Channel Impedance Value: 380 Ohm
Lead Channel Impedance Value: 380 Ohm
Lead Channel Impedance Value: 437 Ohm
Lead Channel Impedance Value: 513 Ohm
Lead Channel Impedance Value: 665 Ohm
Lead Channel Impedance Value: 741 Ohm
Lead Channel Impedance Value: 741 Ohm
Lead Channel Pacing Threshold Amplitude: 0.875 V
Lead Channel Pacing Threshold Amplitude: 1.375 V
Lead Channel Pacing Threshold Pulse Width: 0.4 ms
Lead Channel Pacing Threshold Pulse Width: 0.8 ms
Lead Channel Sensing Intrinsic Amplitude: 1 mV
Lead Channel Sensing Intrinsic Amplitude: 1 mV
Lead Channel Sensing Intrinsic Amplitude: 7 mV
Lead Channel Sensing Intrinsic Amplitude: 7 mV
Lead Channel Setting Pacing Amplitude: 2 V
Lead Channel Setting Pacing Amplitude: 2 V
Lead Channel Setting Pacing Pulse Width: 0.4 ms
Lead Channel Setting Pacing Pulse Width: 0.8 ms
Lead Channel Setting Sensing Sensitivity: 1.2 mV
Zone Setting Status: 755011
Zone Setting Status: 755011

## 2023-01-07 NOTE — Progress Notes (Signed)
Remote pacemaker transmission.   

## 2023-01-08 ENCOUNTER — Ambulatory Visit: Payer: PPO | Admitting: Pharmacist

## 2023-01-08 DIAGNOSIS — E785 Hyperlipidemia, unspecified: Secondary | ICD-10-CM

## 2023-01-08 DIAGNOSIS — E0822 Diabetes mellitus due to underlying condition with diabetic chronic kidney disease: Secondary | ICD-10-CM

## 2023-01-08 DIAGNOSIS — Z7984 Long term (current) use of oral hypoglycemic drugs: Secondary | ICD-10-CM

## 2023-01-08 DIAGNOSIS — N183 Chronic kidney disease, stage 3 unspecified: Secondary | ICD-10-CM

## 2023-01-08 DIAGNOSIS — I5032 Chronic diastolic (congestive) heart failure: Secondary | ICD-10-CM | POA: Diagnosis not present

## 2023-01-08 NOTE — Progress Notes (Signed)
Pharmacy Note  01/08/2023 Name: Vincent Allen MRN: [id number] DOB: 1934-02-17  Subjective: Vincent Allen is a 89 y.o. year old male who is a primary care patient of Bradd Canary, MD. Clinical Pharmacist Practitioner referral was placed to assist with medication, diabetes and CHF management.    Engaged with patient's daughter Alric Seton by telephone today.   Type 2 DM: Patient is taking glipizide 5mg  daily, Januvia 25mg  daily and Jardiance 25mg  daily. Last A1c improved slightly from 7.5% to 7.2% ;  He is in Medicare coverage gap and cost of Januvia and Jardiance is > $100. Today Mrs. Wallace Cullens reports that he has received both Januvia and Jardiance from medication assistance programs in the last 2 months.   CKD 3B-  Current therapy: Jardiance 25mg  daily and losartan 25mg  daily. Over the last year Scr has been stable.  Followed by [facility]. Last appointment was 10/03/2022 with Dr Signe Colt. She changed furosemide to 60mg  / 50mg  alternating daily dose due to bothersome incontinence.  Scr at visit with Dr Signe Colt on 10/03/2022 was 1.77 with eGFR of 37. BUN of 34. Last Scr was 1.68 when checked by Dr Abner Greenspan 12/02/2022 Vitamin D and PTH checked 10/03/2022 were WNL  CHF:  Current therapy: losartan 25mg  daily, Jardiance 25mg  daily, furosemide 40mg   Past therapy: beta blocker - stopped due to hypotension.   Medication Management: enrolled in 2023 get Jardiance, Januvia and Eliquis from patient assistance program. He has been mailed  applications to reapply in early 2024 but did not complete. In June 2024 he was referred to Med Assist Team to start applications for these med again but Med Assist Team was not able to reach patient or son.   After patient's daughter reached out, assisted in completing applications in August.  He has been approved thru 03/18/2023 for medication assistance program thru [employer] program to receive Eliquis, [employer] to receive Jardiance and [employer] patient assistance  program to receive Januvia.   Objective: Review of patient status, including review of consultants reports, laboratory and other test data, was performed as part of comprehensive evaluation and provision of chronic care management services.   Lab Results  Component Value Date   CREATININE 1.68 (H) 12/02/2022   CREATININE 1.73 (H) 07/30/2022   CREATININE 1.97 (H) 01/24/2022    Lab Results  Component Value Date   HGBA1C 7.2 (H) 12/02/2022       Component Value Date/Time   CHOL 140 12/02/2022 1015   TRIG 177.0 (H) 12/02/2022 1015   HDL 42.20 12/02/2022 1015   CHOLHDL 3 12/02/2022 1015   VLDL 35.4 12/02/2022 1015   LDLCALC 63 12/02/2022 1015   LDLCALC 101 (H) 12/16/2019 1543   LDLDIRECT 65.0 11/03/2018 1038     Clinical ASCVD: Yes  The ASCVD Risk score (Arnett DK, et al., 2019) failed to calculate for the following reasons:   The 2019 ASCVD risk score is only valid for ages 26 to 46    BP Readings from Last 3 Encounters:  12/02/22 126/74  09/03/22 130/70  07/30/22 130/78   Blood pressure at nephrology office visit 02/01/2022 was 136/70  Allergies  Allergen Reactions   Atorvastatin     Myalgias / leg pain and weakness   Penicillins Hives and Itching    Has patient had a PCN reaction causing immediate rash, facial/tongue/throat swelling, SOB or lightheadedness with hypotension:Yes Has patient had a PCN reaction causing severe rash involving mucus membranes or skin necrosis:No Has patient had a PCN  reaction that required hospitalization:No Has patient had a PCN reaction occurring within the last 10 years:No If all of the above answers are "NO", then may proceed with Cephalosporin use.    Verapamil     Junctional rhythm   Influenza Vaccines Rash    tinnitus    Medications Reviewed Today     Reviewed by Henrene Pastor, RPH-CPP (Pharmacist) on 01/08/23 at [phone]  Med List Status: <None>   Medication Order Taking? Sig Documenting Provider Last Dose Status Informant   acetaminophen (TYLENOL) 500 MG tablet [id number]  Take 500 mg by mouth every 6 (six) hours as needed for moderate pain. [provider]  Active Child  apixaban (ELIQUIS) 2.5 MG TABS tablet [id number] Yes Take 1 tablet (2.5 mg total) by mouth 2 (two) times daily. Bradd Canary, MD Taking Active            Med Note Clydie Braun, Glenna Durand   Wed Dec 11, 2022  8:06 AM) Medication assistance program thru 03/18/2023  b complex vitamins capsule [id number] Yes Take 1 capsule by mouth daily. [provider] Taking Active   empagliflozin (JARDIANCE) 25 MG TABS tablet [id number] Yes Take 25 mg by mouth daily. [provider] Taking Active   ezetimibe (ZETIA) 10 MG tablet [id number] Yes Take 1 tablet (10 mg total) by mouth daily. Chrystie Nose, MD Taking Active   furosemide (LASIX) 40 MG tablet [id number] Yes TAKE 1 TABLET BY MOUTH TWICE DAILY FOR 5 DAYS THEN DAILY THEREAFTER Bradd Canary, MD Taking Active   glipiZIDE (GLUCOTROL) 5 MG tablet [id number] Yes TAKE 1 TABLET BY MOUTH EVERY DAY BEFORE BREAKFAST Bradd Canary, MD Taking Active   glucose blood (ONE TOUCH ULTRA TEST) test strip [id number] Yes USE 1 STRIP 2 TIMES DAILY TO CHECK BLOOD SUGAR DX E08.22, N18.3 Bradd Canary, MD Taking Active Child  Krill Oil 1000 MG CAPS [id number] Yes Take 1,000 mg by mouth daily. [provider] Taking Active Child  losartan (COZAAR) 25 MG tablet [id number] Yes TAKE 1 TABLET (25 MG TOTAL) BY MOUTH DAILY. Chrystie Nose, MD Taking Active   Multiple Vitamin (MULTIVITAMIN WITH MINERALS) TABS tablet [id number] Yes Take 1 tablet by mouth daily. [provider] Taking Active Child  potassium chloride SA (KLOR-CON M) 20 MEQ tablet [id number] Yes Take 10 mEq by mouth daily. [provider] Taking Active            Med Note Clydie Braun, Fredick Schlosser B   Tue Mar 05, 2022 11:30 AM)    sitaGLIPtin (JANUVIA) 25 MG tablet [id number] Yes Take 1 tablet (25 mg total) by mouth daily. Bradd Canary, MD  Taking Active            Med Note Luis Abed Dec 11, 2022  8:15 AM) Medication assistance program approved 12/06/22 thru 03/18/23  Zinc 50 MG TABS [id number] Yes Take 50 mg by mouth daily. [provider] Taking Active Child            Patient Active Problem List   Diagnosis Date Noted   Recurrent falls 07/31/2022   Permanent atrial fibrillation (HCC) 05/24/2021   Malnutrition of moderate degree 04/27/2021   Unilateral primary osteoarthritis, right knee 02/01/2021   Fatty liver 12/01/2020   Pedal edema 08/14/2020   Knee pain, bilateral 09/20/2019   Overweight 09/14/2019   Urinary frequency 11/06/2016   S/P placement of cardiac pacemaker 10/24/2016   Chronic diastolic CHF (congestive heart failure) (HCC) 10/24/2016  Cardiac device in situ    Debility 08/15/2016   Vitamin D deficiency 07/17/2015   Preventative health care 07/17/2015   Lumbago 12/02/2014   Pain of toe of left foot 03/14/2014   Essential hypertension 01/13/2013   Insomnia 06/27/2011   Osteopenia 09/02/2010   Family history of colon cancer 09/02/2010   Diabetes mellitus with chronic kidney disease (HCC)    Hyperlipidemia LDL goal <70    CTS (carpal tunnel syndrome) 08/28/2010    Medication Assistance: Approved to receive Eliquis from [address], [place] from [address] and [place] from [employer] patient assistance program thru 03/18/2023.      Assessment / Plan: Type 2 DM: A1c at goal; due to patient age and comorbidities A1c < 7.5% is acceptable.  Continue jardiance 25mg , Januvia 25mg  and glipizide 5mg  daily.    CKD - Scr stable.  Continue with plan to follow up with nephrology - next appointment 01/2023 Continue Jardiance to 25mg    CHF:  Continue losartan 25mg  daily, Jardiance 25mg  daily, furosemide 40mg    Medication Management:  All medication assistance programs we applied for have been approved.  Reviewed 2025 Medicare changes. Discussed what medication cost might look  like in 2025 for patient if he decides to continue with current plan. See below. Daughter believes 2025 estimates are affordable but will reach out if they would like to apply for medication assistance program in 2025.  Encouraged her to either review other option on [email or web address] consult with health insurance agent or local [employer] office.        Follow Up:  Telephone follow up appointment with care management team member scheduled for:  3 to 4 months to check 2025 med cost and adherence.    Henrene Pastor, PharmD Clinical Pharmacist [facility]  - [facility] [phone]

## 2023-01-15 ENCOUNTER — Encounter: Payer: Self-pay | Admitting: Student

## 2023-01-15 ENCOUNTER — Ambulatory Visit: Payer: PPO | Attending: Student | Admitting: Student

## 2023-01-15 VITALS — BP 112/62 | HR 76 | Ht 73.0 in | Wt 187.0 lb

## 2023-01-15 DIAGNOSIS — I5022 Chronic systolic (congestive) heart failure: Secondary | ICD-10-CM

## 2023-01-15 DIAGNOSIS — R001 Bradycardia, unspecified: Secondary | ICD-10-CM | POA: Diagnosis not present

## 2023-01-15 DIAGNOSIS — I4819 Other persistent atrial fibrillation: Secondary | ICD-10-CM

## 2023-01-15 DIAGNOSIS — Z950 Presence of cardiac pacemaker: Secondary | ICD-10-CM | POA: Diagnosis not present

## 2023-01-15 DIAGNOSIS — I5042 Chronic combined systolic (congestive) and diastolic (congestive) heart failure: Secondary | ICD-10-CM

## 2023-01-15 LAB — CUP PACEART INCLINIC DEVICE CHECK
Battery Remaining Longevity: 103 mo
Battery Voltage: 2.99 V
Brady Statistic RA Percent Paced: 0 %
Brady Statistic RV Percent Paced: 99.54 %
Date Time Interrogation Session: 20241030124755
Implantable Lead Connection Status: 753985
Implantable Lead Connection Status: 753985
Implantable Lead Connection Status: 753985
Implantable Lead Implant Date: 20180706
Implantable Lead Implant Date: 20180706
Implantable Lead Implant Date: 20230407
Implantable Lead Location: 753858
Implantable Lead Location: 753859
Implantable Lead Location: 753860
Implantable Lead Model: 5076
Implantable Lead Model: 5076
Implantable Pulse Generator Implant Date: 20230407
Lead Channel Impedance Value: 1007 Ohm
Lead Channel Impedance Value: 1083 Ohm
Lead Channel Impedance Value: 1102 Ohm
Lead Channel Impedance Value: 1178 Ohm
Lead Channel Impedance Value: 1216 Ohm
Lead Channel Impedance Value: 1273 Ohm
Lead Channel Impedance Value: 323 Ohm
Lead Channel Impedance Value: 399 Ohm
Lead Channel Impedance Value: 418 Ohm
Lead Channel Impedance Value: 456 Ohm
Lead Channel Impedance Value: 589 Ohm
Lead Channel Impedance Value: 627 Ohm
Lead Channel Impedance Value: 722 Ohm
Lead Channel Impedance Value: 760 Ohm
Lead Channel Pacing Threshold Amplitude: 0.875 V
Lead Channel Pacing Threshold Amplitude: 1.375 V
Lead Channel Pacing Threshold Pulse Width: 0.4 ms
Lead Channel Pacing Threshold Pulse Width: 0.8 ms
Lead Channel Sensing Intrinsic Amplitude: 0.5 mV
Lead Channel Sensing Intrinsic Amplitude: 0.75 mV
Lead Channel Sensing Intrinsic Amplitude: 7 mV
Lead Channel Sensing Intrinsic Amplitude: 7 mV
Lead Channel Setting Pacing Amplitude: 2 V
Lead Channel Setting Pacing Amplitude: 2 V
Lead Channel Setting Pacing Pulse Width: 0.4 ms
Lead Channel Setting Pacing Pulse Width: 0.8 ms
Lead Channel Setting Sensing Sensitivity: 1.2 mV
Zone Setting Status: 755011
Zone Setting Status: 755011

## 2023-01-15 NOTE — Progress Notes (Signed)
Electrophysiology Office Note:   ID:  Vincent Allen, DOB 10-Apr-1933, MRN [id number]  Primary Cardiologist: Chrystie Nose, MD Electrophysiologist: Sherryl Manges, MD      History of Present Illness:   Vincent Allen is a 89 y.o. male with h/o bradycardia, permanent atrial fibrillation, HFmEF, Pacing induced cardiomyopathy s/p CRT upgrade seen today for routine electrophysiology followup.   Since last being seen in our clinic the patient reports doing well from a cardiac perspective. 1-2 times a month he gets a "jumping" in his chest when sleeping on his left side that wakes him up. Not reproducible on check today. Denies SOB or CP. Mild peripheral edema chronically.   Review of systems complete and found to be negative unless listed in HPI.   EP Information / Studies Reviewed:    EKG is ordered today. Personal review as below.  EKG Interpretation Date/Time:  Wednesday January 15 2023 12:04:27 EDT Ventricular Rate:  76 PR Interval:    QRS Duration:  268 QT Interval:  548 QTC Calculation: 616 R Axis:   -44  Text Interpretation: Ventricular-paced rhythm When compared with ECG of 22-Jun-2021 14:49, Vent. rate has increased BY  16 BPM Confirmed by Maxine Glenn [phone]) on 01/15/2023 12:24:44 PM    PPM Interrogation-  reviewed in detail today,  See PACEART report.  Device History: Medtronic  PPM implanted 09/2016 for AF with slow VR BIV upgrade 06/2021 - St Jude Lead NOT MRI safe  Echo 10/2021 LVEF 40-45%, Grade III DD  Physical Exam:   VS:  BP 112/62   Pulse 76   Ht 6\' 1"  (1.854 m)   Wt 187 lb (84.8 kg)   SpO2 96%   BMI 24.67 kg/m    Wt Readings from Last 3 Encounters:  01/15/23 187 lb (84.8 kg)  12/02/22 187 lb 6.4 oz (85 kg)  09/03/22 194 lb 9.6 oz (88.3 kg)     GEN: Well nourished, well developed in no acute distress NECK: No JVD; No carotid bruits CARDIAC: Regular rate and rhythm ( V paced), no murmurs, rubs, gallops RESPIRATORY:  Clear to auscultation  without rales, wheezing or rhonchi  ABDOMEN: Soft, non-tender, non-distended EXTREMITIES:  No edema; No deformity   ASSESSMENT AND PLAN:    Symptomatic bradycardia s/p Medtronic PPM  Normal PPM function See Pace Art report No changes today  Permanent AF  HFmrEF EF 40-45% 10/2021 NYHA II-III symptoms, confounded by limited activity Volume status OK on exam.  CKD IV Per PCP  Disposition:   Follow up with Dr. Graciela Husbands  in 9 months   Signed, Graciella Freer, PA-C

## 2023-01-15 NOTE — Patient Instructions (Signed)
Medication Instructions:  Your physician recommends that you continue on your current medications as directed. Please refer to the Current Medication list given to you today.  *If you need a refill on your cardiac medications before your next appointment, please call your pharmacy*  Lab Work: None ordered If you have labs (blood work) drawn today and your tests are completely normal, you will receive your results only by: MyChart Message (if you have MyChart) OR A paper copy in the mail If you have any lab test that is abnormal or we need to change your treatment, we will call you to review the results.  Follow-Up: At [facility], you and your health needs are our priority.  As part of our continuing mission to provide you with exceptional heart care, we have created designated Provider Care Teams.  These Care Teams include your primary Cardiologist (physician) and Advanced Practice Providers (APPs -  Physician Assistants and Nurse Practitioners) who all work together to provide you with the care you need, when you need it.  Your next appointment:   9 month(s)  Provider:   Sherryl Manges, MD

## 2023-01-30 ENCOUNTER — Encounter: Payer: Self-pay | Admitting: Family Medicine

## 2023-01-31 ENCOUNTER — Other Ambulatory Visit: Payer: Self-pay | Admitting: Family

## 2023-01-31 MED ORDER — GLIPIZIDE 5 MG PO TABS: ORAL_TABLET | ORAL | 1 refills | Status: AC

## 2023-02-07 DIAGNOSIS — I509 Heart failure, unspecified: Secondary | ICD-10-CM | POA: Diagnosis not present

## 2023-02-07 DIAGNOSIS — E1122 Type 2 diabetes mellitus with diabetic chronic kidney disease: Secondary | ICD-10-CM | POA: Diagnosis not present

## 2023-02-07 DIAGNOSIS — N1832 Chronic kidney disease, stage 3b: Secondary | ICD-10-CM | POA: Diagnosis not present

## 2023-02-07 DIAGNOSIS — I4891 Unspecified atrial fibrillation: Secondary | ICD-10-CM | POA: Diagnosis not present

## 2023-02-07 DIAGNOSIS — N2581 Secondary hyperparathyroidism of renal origin: Secondary | ICD-10-CM | POA: Diagnosis not present

## 2023-02-07 DIAGNOSIS — E1129 Type 2 diabetes mellitus with other diabetic kidney complication: Secondary | ICD-10-CM | POA: Diagnosis not present

## 2023-02-07 DIAGNOSIS — I129 Hypertensive chronic kidney disease with stage 1 through stage 4 chronic kidney disease, or unspecified chronic kidney disease: Secondary | ICD-10-CM | POA: Diagnosis not present

## 2023-02-07 LAB — COMPREHENSIVE METABOLIC PANEL: eGFR: 38

## 2023-02-07 LAB — BASIC METABOLIC PANEL: Creatinine: 1.7 — AB (ref 0.6–1.3)

## 2023-02-07 LAB — HEMOGLOBIN A1C: Hemoglobin A1C: 6.7

## 2023-02-12 LAB — LAB REPORT - SCANNED
A1c: 6.7
Albumin, Urine POC: 220.7
Creatinine, POC: 114.3 mg/dL
EGFR: 38
Microalb Creat Ratio: 193

## 2023-02-19 ENCOUNTER — Encounter: Payer: Self-pay | Admitting: Podiatry

## 2023-02-19 ENCOUNTER — Ambulatory Visit (INDEPENDENT_AMBULATORY_CARE_PROVIDER_SITE_OTHER): Payer: PPO | Admitting: Podiatry

## 2023-02-19 VITALS — Ht 73.0 in | Wt 187.0 lb

## 2023-02-19 DIAGNOSIS — E0822 Diabetes mellitus due to underlying condition with diabetic chronic kidney disease: Secondary | ICD-10-CM | POA: Diagnosis not present

## 2023-02-19 DIAGNOSIS — B351 Tinea unguium: Secondary | ICD-10-CM

## 2023-02-19 DIAGNOSIS — N183 Chronic kidney disease, stage 3 unspecified: Secondary | ICD-10-CM | POA: Diagnosis not present

## 2023-02-19 DIAGNOSIS — M79675 Pain in left toe(s): Secondary | ICD-10-CM | POA: Diagnosis not present

## 2023-02-19 DIAGNOSIS — M79674 Pain in right toe(s): Secondary | ICD-10-CM | POA: Diagnosis not present

## 2023-02-23 NOTE — Progress Notes (Signed)
Subjective:  Patient ID: Vincent Allen, male    DOB: Mar 16, 1934,  MRN: [id number]  Vincent Allen presents to clinic today for at risk foot care. Pt has h/o NIDDM with chronic kidney disease and painful thick toenails that are difficult to trim. Pain interferes with ambulation. Aggravating factors include wearing enclosed shoe gear. Pain is relieved with periodic professional debridement.  Chief Complaint  Patient presents with   Nail Problem    Pt is here for [facility], Last A1C was 6, PCP is Dr Abner Greenspan and LOV was last week.   New problem(s): None.   PCP is Bradd Canary, MD.  Allergies  Allergen Reactions   Atorvastatin     Myalgias / leg pain and weakness   Penicillins Hives and Itching    Has patient had a PCN reaction causing immediate rash, facial/tongue/throat swelling, SOB or lightheadedness with hypotension:Yes Has patient had a PCN reaction causing severe rash involving mucus membranes or skin necrosis:No Has patient had a PCN reaction that required hospitalization:No Has patient had a PCN reaction occurring within the last 10 years:No If all of the above answers are "NO", then may proceed with Cephalosporin use.    Verapamil     Junctional rhythm   Influenza Vaccines Rash    tinnitus    Review of Systems: Negative except as noted in the HPI.  Objective: No changes noted in today's physical examination. There were no vitals filed for this visit. Vincent Allen is a pleasant 89 y.o. male obese in NAD. AAO x 3.  Vascular Examination: Capillary refill time immediate b/l. Vascular status intact b/l with palpable DP pulses; faintly palpable PT pulses. Pedal hair decreased b/l. No pain with calf compression b/l. Skin temperature gradient WNL b/l. No cyanosis or clubbing noted b/l. No ischemia or gangrene b/l. Trace edema noted BLE. Evidence of chronic venous insufficiency b/l LE.  Neurological Examination: Sensation grossly intact b/l with 10 gram  monofilament.  Dermatological Examination: Pedal skin with normal turgor, texture and tone b/l.  No open wounds. No interdigital macerations.   Toenails 1-5 b/l thick, discolored, elongated with subungual debris and pain on dorsal palpation.   No corns, calluses nor porokeratotic lesions noted.  Musculoskeletal Examination: Muscle strength 5/5 to all lower extremity muscle groups bilaterally. Pes planus deformity noted bilateral LE.  Radiographs: None  Last A1c:      Latest Ref Rng & Units 12/02/2022   10:15 AM 07/30/2022   11:33 AM  Hemoglobin A1C  Hemoglobin-A1c 4.6 - 6.5 % 7.2  7.5    Assessment/Plan: 1. Pain due to onychomycosis of toenails of both feet   2. Diabetes mellitus due to underlying condition with stage 3 chronic kidney disease, unspecified whether long term insulin use, unspecified whether stage 3a or 3b CKD (HCC)     -Patient was evaluated today. All questions/concerns addressed on today's visit. -Patient's family member present. All questions/concerns addressed on today's visit. -Continue foot and shoe inspections daily. Monitor blood glucose per PCP/Endocrinologist's recommendations. -Patient to continue soft, supportive shoe gear daily. -Toenails 1-5 b/l were debrided in length and girth with sterile nail nippers and dremel without iatrogenic bleeding.  -Patient/POA to call should there be question/concern in the interim.   Return in about 4 months (around 06/20/2023).  Vincent Allen, DPM      [facility] LOCATION: [address]  [place], [place] [address]                   Office [phone]   [facility] LOCATION: [address] [place], [place] [address] Office [phone]

## 2023-03-04 ENCOUNTER — Ambulatory Visit (HOSPITAL_BASED_OUTPATIENT_CLINIC_OR_DEPARTMENT_OTHER): Payer: PPO | Admitting: Internal Medicine

## 2023-03-04 VITALS — BP 136/88 | HR 86 | Ht 70.0 in | Wt 190.8 lb

## 2023-03-04 DIAGNOSIS — Z950 Presence of cardiac pacemaker: Secondary | ICD-10-CM | POA: Diagnosis not present

## 2023-03-04 DIAGNOSIS — I5042 Chronic combined systolic (congestive) and diastolic (congestive) heart failure: Secondary | ICD-10-CM | POA: Diagnosis not present

## 2023-03-04 DIAGNOSIS — I447 Left bundle-branch block, unspecified: Secondary | ICD-10-CM | POA: Diagnosis not present

## 2023-03-04 DIAGNOSIS — I5032 Chronic diastolic (congestive) heart failure: Secondary | ICD-10-CM

## 2023-03-04 DIAGNOSIS — I4821 Permanent atrial fibrillation: Secondary | ICD-10-CM

## 2023-03-04 NOTE — Progress Notes (Unsigned)
OFFICE FOLLOW-UP NOTE  Chief Complaint:  Follow-up CHF, pre-op  Primary Care Physician: Bradd Canary, MD  HPI:  Vincent Allen is a 89 y.o. male with a past medial history significant for atrial fibrillation/flutter with slow ventricular response. Seen recently in the hospital after mechanical fall in the home. He has a history of paroxysmal atrial fibrillation in the past and was seen by Dr. Donnie Aho that was not on any anticoagulation. His CHADSVASC score is 4. He was started on Eliquis 2.5 mg twice a day due to age greater than 80 creatinine greater than 1.5. An echocardiogram was performed which showed normal systolic function, diastolic dysfunction moderate left atrial enlargement. I arranged for outpatient monitoring which shows persistent atrial fibrillation/flutter with controlled ventricular response and at times bradycardia but no clear long pauses or any other indications for pacemaker at this time. He reports being fatigued. He's been an active problem for more than 60 years and is interested in getting back to flying feels that his lack of energy is keeping him back from that.  09/12/2016  Vincent Allen returns for follow-up today. He underwent recent a cardioversion by Dr. Royann Shivers. This was successful and he reported feeling a little better with heart rates in sinus in the 50s for about a week, however then started to feel more fatigued. He's not been on any AV nodal blocking medications. He reports compliance with Eliquis. EKG today shows atrial fibrillation with a marked bradycardic response of 40. Notes that his heart rate will not necessarily increase with any exertion.  10/24/2016  Vincent Allen returns today for follow-up. He underwent successful pacemaker placement by Dr. Graciela Husbands about 3 weeks ago. This was a Medtronic dual-chamber pacemaker. Subsequent pacemaker follow-up shows 100% atrial fibrillation. Pacemaker appears to be working properly. The wound appears to be well-healed. He is  restricted his arm movement now and is interested in more activity. He recently had lab work in advance to upcoming visit with Dr. Rogelia Rohrer. Total cholesterol is 110, Travis was 167, HDL 37 and LDL 39. Metabolic profile was unremarkable with potassium however was slightly low at 3.4 and creatinine 1.45. Blood pressure is elevated today. I reviewed home readings indicating his blood pressure ranges from the about the 140s to 160s systolic over 80s diastolic. Heart rate is been mostly in the 70s and 80s. There is no evidence of rapid atrial fibrillation or bradycardia. He reports improvement in his energy level.  04/28/2017  Vincent Allen returns today for follow-up.  He has had significant improvement in his symptoms after placement of pacemaker.  He still gets a little short of breath but is not been quite as active.  Weight is somewhat better now to 19 and was as high as 225 in November.  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  Blood pressure is well controlled today.  Has had follow-up with Dr. Graciela Husbands who feels the pacemaker is working appropriately.  05/11/2018  Vincent Allen is seen today in annual follow-up.  Overall he is doing well.  He is not been working on his airplane is much recently.  He says that he is worried about falls.  He did sell off a lot of his projects as well as his angina recently.  He denies any chest pain or shortness of breath.  He said no syncopal episodes.  Recent pacemaker interrogation shows a normal pacing and he is ventricular paced today.  He has persistent if not permanent A. fib.  He  is not have any bleeding problems on Eliquis.  Blood pressures been well controlled.  Lab work recently and April 23, 2018 showed total cholesterol 171, HDL 34, triglycerides of 182 and LDL of 100.  His goal LDL should be slightly lower than that I would suspect if we want to be aggressive probably less than 100.  He is not listed to be on statin therapy but previously was on  atorvastatin 20 mg.  He is not sure if he is taking it but again I had him listed on that a year ago.  I would advise if he is come off the medication to restart that otherwise we may need to consider increasing it further.  05/09/2021  Vincent Allen is seen today in follow-up.  He was recently hospitalized and I cared for him for about a week of that hospitalization that actually went more than 2 weeks.  He had symptoms of right heart failure but also had some LV dysfunction with EF around 45%.  He underwent paracentesis with about 5 L of fluid removed.  This was transudative and look more like heart failure.  Subsequently was placed on milrinone and had good color oximetry panels.  He diuresed from 244 pounds down to 201 pounds today which is his new her dry weight.  On discharge his Lasix was increased to 40 mg twice daily.  He is currently taking that in the morning.  He continues to have issues with nocturia and has had BPH for years but is not on treatment for that.  I suspect this is the reason for his nocturnal overflow incontinence.  Blood pressure is good today.  He says he feels well and is ambulating without difficulty.  Oxygen saturation has improved.  He will need repeat labs including metabolic profile and magnesium because of issues with hypokalemia during the hospitalization.  He has a transition of care follow-up with heart failure on February 24.  It was also noted that he had 100% ventricular pacing on recent remote checks.  This could be an etiology of his reduced LV function.  He was seen by Dr. Royann Shivers, who recommended evaluation for CRT-P therapy.  I would then refer Vincent Allen to our electrophysiologist.  10/25/2021  Vincent Allen returns today for follow-up.  He had been referred to Dr. Graciela Husbands for evaluation of possible biventricular pacemaker upgrade.  He did qualify for that and had placement of that earlier this year.  Since then he has had significant improvement in his heart failure symptoms.  Now  NYHA class II.  He has been seen by Dr. Graciela Husbands recently and his primary care provider who increased his diuretic.  He 1 point he was up to 80 mg of Lasix but noted being dizzy on this dose.  He is currently on Lasix 40 mg daily.  He has appeared euvolemic.  Blood pressure is elevated somewhat today.  He is only on Jardiance for heart failure medications.  He does have stage III-IV chronic kidney disease.  GFR around 32.  Recent lipids in July showed total cholesterol 161, HDL 41, triglycerides 90 and LDL 102.  03/08/2022  Vincent Allen is seen today for follow-up.  He is doing well and is accompanied by his son.  He is having no chest pain or worsening shortness of breath.  He does have fairly poor hearing.  His remote pacer checks have been stable.  Lipids have been reasonably well-controlled with LDL of 83.  He is due for refill of his Zetia.  He denies any worsening heart failure symptoms.  EKG shows a ventricular paced rhythm.  Even though he had clinical response to BiV pacing, his LVEF had not really improved.  09/03/2022  Vincent Allen returns today for follow-up heart failure.  Overall he seems to be doing well.  His weight has been stable and has had no worsening edema.  He denies worsening shortness of breath, orthopnea or any heart failure symptoms.  He has had remote pacer checks which are stable.  He is scheduled to have another back injection in the near future and requires clearance for that.  He is already advised to hold his Eliquis 3 days prior to the procedure and start at least 1 to 2 days afterwards.  I do not see any reason why he cannot proceed with injection at this time.  PMHx:  Past Medical History:  Diagnosis Date   Arthritis    Bradycardia    Cataract    Chronic kidney disease stage III (GFR 30-59 ml/min) 05/03/2012   Colon polyps    Complication of anesthesia    emesis   Diabetes mellitus    GERD (gastroesophageal reflux disease)    HOH (hard of hearing)    HTN (hypertension)  01/13/2013   Hyperlipidemia    Hypertension    Pacemaker 12/2016   Persistent atrial fibrillation (HCC)    Presence of permanent cardiac pacemaker 09/20/2016   Symptomatic bradycardia    Vitamin D deficiency 07/17/2015    Past Surgical History:  Procedure Laterality Date   ANKLE ARTHROPLASTY     BIV PACEMAKER INSERTION CRT-P N/A 06/22/2021   Procedure: UPGRADE TO BIV PACEMAKER INSERTION CRT-P;  Surgeon: Duke Salvia, MD;  Location[facility] INVASIVE CV LAB;  Service: Cardiovascular;  Laterality: N/A;   CARDIOVERSION N/A 08/20/2016   Procedure: CARDIOVERSION;  Surgeon: Thurmon Fair, MD;  Location: [facility] ENDOSCOPY;  Service: Cardiovascular;  Laterality: N/A;   CATARACT EXTRACTION  2010, 2014   CATARACT EXTRACTION  02/28/14   CHOLECYSTECTOMY     CHOLECYSTECTOMY N/A 05/03/2012   Procedure: LAPAROSCOPIC CHOLECYSTECTOMY WITH INTRAOPERATIVE CHOLANGIOGRAM;  Surgeon: Cherylynn Ridges, MD;  Location: [facility] OR;  Service: General;  Laterality: N/A;   INSERT / REPLACE / REMOVE PACEMAKER  09/20/2016   PACEMAKER IMPLANT N/A 09/20/2016   Procedure: Pacemaker Implant;  Surgeon: Duke Salvia, MD;  Location: [facility] INVASIVE CV LAB;  Service: Cardiovascular;  Laterality: N/A;   ROTATOR CUFF REPAIR     SHOULDER SURGERY     TONSILLECTOMY      FAMHx:  Family History  Problem Relation Age of Onset   Cancer Mother        colon, breast, pancreas, skin cancer   Heart disease Father        heart valve replaced   Stroke Father    Diabetes Father    Cancer Paternal Grandmother        colon   Obesity Son    Heart disease Son        bradycardia    SOCHx:   reports that he has never smoked. He has never used smokeless tobacco. He reports current alcohol use of about 1.0 standard drink of alcohol per week. He reports that he does not use drugs.  ALLERGIES:  Allergies  Allergen Reactions   Atorvastatin     Myalgias / leg pain and weakness   Penicillins Hives and Itching    Has patient had a PCN reaction causing  immediate rash, facial/tongue/throat swelling, SOB or lightheadedness with  hypotension:Yes Has patient had a PCN reaction causing severe rash involving mucus membranes or skin necrosis:No Has patient had a PCN reaction that required hospitalization:No Has patient had a PCN reaction occurring within the last 10 years:No If all of the above answers are "NO", then may proceed with Cephalosporin use.    Verapamil     Junctional rhythm   Influenza Vaccines Rash    tinnitus    ROS: Pertinent items noted in HPI and remainder of comprehensive ROS otherwise negative.  HOME MEDS: Current Outpatient Medications on File Prior to Visit  Medication Sig Dispense Refill   acetaminophen (TYLENOL) 500 MG tablet Take 500 mg by mouth every 6 (six) hours as needed for moderate pain.     apixaban (ELIQUIS) 2.5 MG TABS tablet Take 1 tablet (2.5 mg total) by mouth 2 (two) times daily. 180 tablet 1   b complex vitamins capsule Take 1 capsule by mouth daily.     empagliflozin (JARDIANCE) 25 MG TABS tablet Take 25 mg by mouth daily.     ezetimibe (ZETIA) 10 MG tablet Take 1 tablet (10 mg total) by mouth daily. 90 tablet 3   furosemide (LASIX) 40 MG tablet TAKE 1 TABLET BY MOUTH TWICE DAILY FOR 5 DAYS THEN DAILY THEREAFTER 180 tablet 1   glipiZIDE (GLUCOTROL) 5 MG tablet 1 tab po BID 180 tablet 1   Krill Oil 1000 MG CAPS Take 1,000 mg by mouth daily.     losartan (COZAAR) 25 MG tablet TAKE 1 TABLET (25 MG TOTAL) BY MOUTH DAILY. 90 tablet 3   Multiple Vitamin (MULTIVITAMIN WITH MINERALS) TABS tablet Take 1 tablet by mouth daily.     potassium chloride SA (KLOR-CON M) 20 MEQ tablet Take 10 mEq by mouth daily.     sitaGLIPtin (JANUVIA) 25 MG tablet Take 1 tablet (25 mg total) by mouth daily. 30 tablet 2   Zinc 50 MG TABS Take 50 mg by mouth daily.     glucose blood (ONE TOUCH ULTRA TEST) test strip USE 1 STRIP 2 TIMES DAILY TO CHECK BLOOD SUGAR DX E08.22, N18.3 300 each 1   No current facility-administered  medications on file prior to visit.    LABS/IMAGING: No results found for this or any previous visit (from the past 48 hours). No results found.  LIPID PANEL:    Component Value Date/Time   CHOL 140 12/02/2022 1015   TRIG 177.0 (H) 12/02/2022 1015   HDL 42.20 12/02/2022 1015   CHOLHDL 3 12/02/2022 1015   VLDL 35.4 12/02/2022 1015   LDLCALC 63 12/02/2022 1015   LDLCALC 101 (H) 12/16/2019 1543   LDLDIRECT 65.0 11/03/2018 1038     WEIGHTS: Wt Readings from Last 3 Encounters:  03/04/23 199 lb 11.2 oz (90.6 kg)  02/19/23 187 lb (84.8 kg)  01/15/23 187 lb (84.8 kg)    VITALS: BP 136/88   Pulse 86   Ht 5\' 10"  (1.778 m)   Wt 199 lb 11.2 oz (90.6 kg)   SpO2 96%   BMI 28.65 kg/m   EXAM: General appearance: alert, no distress, and pale Neck: no carotid bruit, no JVD, and thyroid not enlarged, symmetric, no tenderness/mass/nodules Lungs: clear to auscultation bilaterally Heart: regular rate and rhythm, S1, S2 normal, no murmur, click, rub or gallop and Healing pacemaker site left upper chest Abdomen: soft, non-tender; bowel sounds normal; no masses,  no organomegaly and mildly protuberant Extremities: edema 1+ bilateral LE Pulses: 2+ and symmetric Skin: Skin color, texture, turgor normal. 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   Vincent Allen to proceed with lumbar injections.  He should hold his Eliquis at least 3 days prior to procedure and start at least 1 to 2  days afterwards when there is low bleeding risk as per the procedure physician.  From a heart failure standpoint he seems to be stable.  No worsening or decompensated symptoms.  Pacemaker is working appropriately.  No changes to his medicines today.  Plan follow-up with me in 6 months or sooner as necessary.  Chrystie Nose, MD, [facility], FACP  [place]  [facility]  Medical Director of the Advanced Lipid Disorders &  Cardiovascular Risk Reduction Clinic Diplomate of the American Board of Clinical Lipidology Attending Cardiologist  Direct Dial: [phone]  Fax: [phone]  Website:  [email or web address] 03/04/2023, 10:44 AM

## 2023-03-04 NOTE — Patient Instructions (Signed)
Medication Instructions:  NO CHANGES  *If you need a refill on your cardiac medications before your next appointment, please call your pharmacy*    Follow-Up: At [facility], you and your health needs are our priority.  As part of our continuing mission to provide you with exceptional heart care, we have created designated Provider Care Teams.  These Care Teams include your primary Cardiologist (physician) and Advanced Practice Providers (APPs -  Physician Assistants and Nurse Practitioners) who all work together to provide you with the care you need, when you need it.  We recommend signing up for the patient portal called "MyChart".  Sign up information is provided on this After Visit Summary.  MyChart is used to connect with patients for Virtual Visits (Telemedicine).  Patients are able to view lab/test results, encounter notes, upcoming appointments, etc.  Non-urgent messages can be sent to your provider as well.   To learn more about what you can do with MyChart, go to [email or web address].    Your next appointment:    6 months with Dr. Debara Pickett

## 2023-03-07 ENCOUNTER — Other Ambulatory Visit: Payer: Self-pay | Admitting: Internal Medicine

## 2023-03-25 ENCOUNTER — Ambulatory Visit (INDEPENDENT_AMBULATORY_CARE_PROVIDER_SITE_OTHER): Payer: PPO

## 2023-03-25 DIAGNOSIS — I447 Left bundle-branch block, unspecified: Secondary | ICD-10-CM

## 2023-03-26 LAB — CUP PACEART REMOTE DEVICE CHECK
Battery Remaining Longevity: 100 mo
Battery Voltage: 2.99 V
Brady Statistic RA Percent Paced: 0 %
Brady Statistic RV Percent Paced: 99.79 %
Date Time Interrogation Session: 20250106230345
Implantable Lead Connection Status: 753985
Implantable Lead Connection Status: 753985
Implantable Lead Connection Status: 753985
Implantable Lead Implant Date: 20180706
Implantable Lead Implant Date: 20180706
Implantable Lead Implant Date: 20230407
Implantable Lead Location: 753858
Implantable Lead Location: 753859
Implantable Lead Location: 753860
Implantable Lead Model: 5076
Implantable Lead Model: 5076
Implantable Pulse Generator Implant Date: 20230407
Lead Channel Impedance Value: 1102 Ohm
Lead Channel Impedance Value: 1121 Ohm
Lead Channel Impedance Value: 1197 Ohm
Lead Channel Impedance Value: 1197 Ohm
Lead Channel Impedance Value: 1292 Ohm
Lead Channel Impedance Value: 304 Ohm
Lead Channel Impedance Value: 380 Ohm
Lead Channel Impedance Value: 380 Ohm
Lead Channel Impedance Value: 437 Ohm
Lead Channel Impedance Value: 494 Ohm
Lead Channel Impedance Value: 608 Ohm
Lead Channel Impedance Value: 741 Ohm
Lead Channel Impedance Value: 741 Ohm
Lead Channel Impedance Value: 988 Ohm
Lead Channel Pacing Threshold Amplitude: 0.75 V
Lead Channel Pacing Threshold Amplitude: 1.375 V
Lead Channel Pacing Threshold Pulse Width: 0.4 ms
Lead Channel Pacing Threshold Pulse Width: 0.8 ms
Lead Channel Sensing Intrinsic Amplitude: 1.125 mV
Lead Channel Sensing Intrinsic Amplitude: 1.125 mV
Lead Channel Sensing Intrinsic Amplitude: 7 mV
Lead Channel Sensing Intrinsic Amplitude: 7 mV
Lead Channel Setting Pacing Amplitude: 2 V
Lead Channel Setting Pacing Amplitude: 2 V
Lead Channel Setting Pacing Pulse Width: 0.4 ms
Lead Channel Setting Pacing Pulse Width: 0.8 ms
Lead Channel Setting Sensing Sensitivity: 1.2 mV
Zone Setting Status: 755011
Zone Setting Status: 755011

## 2023-04-02 NOTE — Progress Notes (Signed)
Established patient visit   Patient: Vincent Allen   DOB: [birth year]   89 y.o. Male  MRN: [id number] Visit Date: 04/03/2023  Today's healthcare provider: Alfredia Ferguson, PA-C   Cc. Follow up, hand numbness  Subjective    Pt presents today for a routine follow up . Only concern is intermittent hand numbness/tingling -- his middle, ring, pinky finger of both hands. His nephrologist ? Reportedly started him on daily / prn prednisone, which he does report helped.   He denies pain.   Medications: Outpatient Medications Prior to Visit  Medication Sig   acetaminophen (TYLENOL) 500 MG tablet Take 500 mg by mouth every 6 (six) hours as needed for moderate pain.   apixaban (ELIQUIS) 2.5 MG TABS tablet Take 1 tablet (2.5 mg total) by mouth 2 (two) times daily.   b complex vitamins capsule Take 1 capsule by mouth daily.   empagliflozin (JARDIANCE) 25 MG TABS tablet Take 25 mg by mouth daily.   ezetimibe (ZETIA) 10 MG tablet TAKE 1 TABLET BY MOUTH EVERY DAY   furosemide (LASIX) 40 MG tablet TAKE 1 TABLET BY MOUTH TWICE DAILY FOR 5 DAYS THEN DAILY THEREAFTER   glipiZIDE (GLUCOTROL) 5 MG tablet 1 tab po BID   glucose blood (ONE TOUCH ULTRA TEST) test strip USE 1 STRIP 2 TIMES DAILY TO CHECK BLOOD SUGAR DX E08.22, N18.3   Krill Oil 1000 MG CAPS Take 1,000 mg by mouth daily.   losartan (COZAAR) 25 MG tablet TAKE 1 TABLET (25 MG TOTAL) BY MOUTH DAILY.   Multiple Vitamin (MULTIVITAMIN WITH MINERALS) TABS tablet Take 1 tablet by mouth daily.   potassium chloride SA (KLOR-CON M) 20 MEQ tablet Take 10 mEq by mouth daily.   sitaGLIPtin (JANUVIA) 25 MG tablet Take 1 tablet (25 mg total) by mouth daily.   Zinc 50 MG TABS Take 50 mg by mouth daily.   No facility-administered medications prior to visit.    Review of Systems  Constitutional:  Negative for fatigue and fever.  Respiratory:  Negative for cough and shortness of breath.   Cardiovascular:  Negative for chest pain, palpitations and  leg swelling.  Neurological:  Positive for numbness. Negative for dizziness and headaches.       Objective    BP (!) 147/80   Pulse 84   Temp 97.7 F (36.5 C) (Oral)   Ht 5\' 10"  (1.778 m)   Wt 195 lb 4 oz (88.6 kg)   SpO2 95%   BMI 28.02 kg/m    Physical Exam Constitutional:      General: He is awake.     Appearance: He is well-developed.  HENT:     Head: Normocephalic.  Eyes:     Conjunctiva/sclera: Conjunctivae normal.  Cardiovascular:     Rate and Rhythm: Normal rate and regular rhythm.     Heart sounds: Normal heart sounds.  Pulmonary:     Effort: Pulmonary effort is normal.     Breath sounds: Normal breath sounds.  Musculoskeletal:     Right lower leg: No edema.     Left lower leg: No edema.     Comments: Negative carpal compression test  Skin:    General: Skin is warm.  Neurological:     Mental Status: He is alert and oriented to person, place, and time.  Psychiatric:        Attention and Perception: Attention normal.        Mood and Affect: Mood normal.  Speech: Speech normal.        Behavior: Behavior is cooperative.      Results for orders placed or performed in visit on 04/03/23  Basic metabolic panel  Result Value Ref Range   Creatinine 1.7 (A) 0.6 - 1.3  Comprehensive metabolic panel  Result Value Ref Range   eGFR 38   Hemoglobin A1c  Result Value Ref Range   Hemoglobin A1C 6.7     Assessment & Plan    Chronic diastolic CHF (congestive heart failure) (HCC) Assessment & Plan: Well controlled,  Lasix 40 mg daily  Pt is euvolemic today   Permanent atrial fibrillation (HCC) Assessment & Plan: Anticoag with eliquis 2.5 mg bid     Diabetes mellitus due to underlying condition with stage 3b chronic kidney disease, unspecified whether long term insulin use (HCC) Assessment & Plan: Last A1c 6.7% 11/24 will not repeat today Manages with jardiance 25 mg, glipizide 5 mg, januvia 25 mg  Uacr utd, on arb   Hypertension associated  with diabetes [facility]) Assessment & Plan: Chronic, moderate control Manages w/ losartan 25 mg Reviewed 11/24 cmp  F/u 4 mo   Finger numbness   Finger numbness does not seem to be from carpal tunnel, explained possible cubital tunnel etiology and trial of elbow compression sleeve, but may also have cervical etiology.  I would prefer not to use regular prednisone as therapy, but pt dislikes physical therapy and does not want to be eval.   Return in about 4 months (around 08/01/2023) for chronic conditions.       Alfredia Ferguson, PA-C  [facility] [phone] (phone) [phone] (fax)  [facility]

## 2023-04-03 ENCOUNTER — Ambulatory Visit: Payer: PPO | Admitting: Family Medicine

## 2023-04-03 ENCOUNTER — Ambulatory Visit: Payer: PPO | Admitting: Physician Assistant

## 2023-04-03 VITALS — BP 147/80 | HR 84 | Temp 97.7°F | Ht 70.0 in | Wt 195.2 lb

## 2023-04-03 DIAGNOSIS — Z7984 Long term (current) use of oral hypoglycemic drugs: Secondary | ICD-10-CM

## 2023-04-03 DIAGNOSIS — E1159 Type 2 diabetes mellitus with other circulatory complications: Secondary | ICD-10-CM

## 2023-04-03 DIAGNOSIS — I5032 Chronic diastolic (congestive) heart failure: Secondary | ICD-10-CM | POA: Diagnosis not present

## 2023-04-03 DIAGNOSIS — I152 Hypertension secondary to endocrine disorders: Secondary | ICD-10-CM

## 2023-04-03 DIAGNOSIS — I4821 Permanent atrial fibrillation: Secondary | ICD-10-CM | POA: Diagnosis not present

## 2023-04-03 DIAGNOSIS — N1832 Chronic kidney disease, stage 3b: Secondary | ICD-10-CM

## 2023-04-03 DIAGNOSIS — R200 Anesthesia of skin: Secondary | ICD-10-CM

## 2023-04-03 DIAGNOSIS — E0822 Diabetes mellitus due to underlying condition with diabetic chronic kidney disease: Secondary | ICD-10-CM

## 2023-04-04 ENCOUNTER — Encounter: Payer: Self-pay | Admitting: Physician Assistant

## 2023-04-04 NOTE — Assessment & Plan Note (Signed)
Anticoag with eliquis 2.5 mg bid

## 2023-04-04 NOTE — Assessment & Plan Note (Signed)
Well controlled,  Lasix 40 mg daily  Pt is euvolemic today

## 2023-04-04 NOTE — Assessment & Plan Note (Signed)
Last A1c 6.7% 11/24 will not repeat today Manages with jardiance 25 mg, glipizide 5 mg, januvia 25 mg  Uacr utd, on arb

## 2023-04-04 NOTE — Assessment & Plan Note (Signed)
Chronic, moderate control Manages w/ losartan 25 mg Reviewed 11/24 cmp  F/u 4 mo

## 2023-04-09 ENCOUNTER — Telehealth: Payer: PPO

## 2023-04-16 ENCOUNTER — Encounter: Payer: Self-pay | Admitting: Internal Medicine

## 2023-04-24 ENCOUNTER — Ambulatory Visit (INDEPENDENT_AMBULATORY_CARE_PROVIDER_SITE_OTHER): Payer: PPO | Admitting: *Deleted

## 2023-04-24 VITALS — Ht 70.0 in | Wt 185.0 lb

## 2023-04-24 DIAGNOSIS — Z0000 Encounter for general adult medical examination without abnormal findings: Secondary | ICD-10-CM

## 2023-04-24 NOTE — Progress Notes (Signed)
 Subjective:   Vincent Allen is a 89 y.o. male who presents for Medicare Annual/Subsequent preventive examination.  Visit Complete: Virtual I connected with  Vincent Allen on 04/24/23 by a audio enabled telemedicine application and verified that I am speaking with the correct person using two identifiers.  Patient Location: Home  Provider Location: Office/Clinic  I discussed the limitations of evaluation and management by telemedicine. The patient expressed understanding and agreed to proceed.  Vital Signs: Because this visit was a virtual/telehealth visit, some criteria may be missing or patient reported. Any vitals not documented were not able to be obtained and vitals that have been documented are patient reported.   Cardiac Risk Factors include: male gender;advanced age (>49men, >73 women);diabetes mellitus;dyslipidemia;hypertension     Objective:    Today's Vitals   04/24/23 0944  Weight: 185 lb (83.9 kg)  Height: 5' 10 (1.778 m)   Body mass index is 26.54 kg/m.     04/24/2023    9:41 AM 02/20/2022   11:28 AM 06/22/2021    7:44 AM 04/20/2021    5:24 PM 01/01/2021    1:12 PM 10/06/2019    1:51 PM 11/12/2016    2:56 PM  Advanced Directives  Does Patient Have a Medical Advance Directive? Yes Yes No Yes Yes Yes Yes  Type of Estate Agent of [place];Living will Healthcare Power of [place];Living will  Healthcare Power of [place] of [place];Living will Healthcare Power of [place];Living will Healthcare Power of [place];Living will  Does patient want to make changes to medical advance directive? No - Patient declined   No - Patient declined Yes (MAU/Ambulatory/Procedural Areas - Information given) No - Patient declined   Copy of Healthcare Power of Attorney in Chart? No - copy requested No - copy requested   No - copy requested No - copy requested   Would patient like information on creating a medical advance directive?   No -  Patient declined        Current Medications (verified) Outpatient Encounter Medications as of 04/24/2023  Medication Sig   acetaminophen  (TYLENOL ) 500 MG tablet Take 500 mg by mouth every 6 (six) hours as needed for moderate pain.   apixaban  (ELIQUIS ) 2.5 MG TABS tablet Take 1 tablet (2.5 mg total) by mouth 2 (two) times daily.   b complex vitamins capsule Take 1 capsule by mouth daily.   empagliflozin  (JARDIANCE ) 25 MG TABS tablet Take 25 mg by mouth daily.   ezetimibe  (ZETIA ) 10 MG tablet TAKE 1 TABLET BY MOUTH EVERY DAY   furosemide  (LASIX ) 40 MG tablet TAKE 1 TABLET BY MOUTH TWICE DAILY FOR 5 DAYS THEN DAILY THEREAFTER   glipiZIDE  (GLUCOTROL ) 5 MG tablet 1 tab po BID   glucose blood (ONE TOUCH ULTRA TEST) test strip USE 1 STRIP 2 TIMES DAILY TO CHECK BLOOD SUGAR DX E08.22, N18.3   Krill Oil 1000 MG CAPS Take 1,000 mg by mouth daily.   losartan  (COZAAR ) 25 MG tablet TAKE 1 TABLET (25 MG TOTAL) BY MOUTH DAILY.   Multiple Vitamin (MULTIVITAMIN WITH MINERALS) TABS tablet Take 1 tablet by mouth daily.   potassium chloride  SA (KLOR-CON  M) 20 MEQ tablet Take 10 mEq by mouth daily.   sitaGLIPtin  (JANUVIA ) 25 MG tablet Take 1 tablet (25 mg total) by mouth daily.   Zinc 50 MG TABS Take 50 mg by mouth daily.   No facility-administered encounter medications on file as of 04/24/2023.    Allergies (verified) Atorvastatin , Penicillins, Verapamil , and  Influenza vaccines   History: Past Medical History:  Diagnosis Date   Arthritis    Bradycardia    Cataract    Chronic kidney disease stage III (GFR 30-59 ml/min) 05/03/2012   Colon polyps    Complication of anesthesia    emesis   Diabetes mellitus    GERD (gastroesophageal reflux disease)    HOH (hard of hearing)    HTN (hypertension) 01/13/2013   Hyperlipidemia    Hypertension    Pacemaker 12/2016   Persistent atrial fibrillation (HCC)    Presence of permanent cardiac pacemaker 09/20/2016   Symptomatic bradycardia    Vitamin D  deficiency  07/17/2015   Past Surgical History:  Procedure Laterality Date   ANKLE ARTHROPLASTY     BIV PACEMAKER INSERTION CRT-P N/A 06/22/2021   Procedure: UPGRADE TO BIV PACEMAKER INSERTION CRT-P;  Surgeon: Fernande Elspeth BROCKS, MD;  Location: [facility] INVASIVE CV LAB;  Service: Cardiovascular;  Laterality: N/A;   CARDIOVERSION N/A 08/20/2016   Procedure: CARDIOVERSION;  Surgeon: Francyne Headland, MD;  Location: [facility] ENDOSCOPY;  Service: Cardiovascular;  Laterality: N/A;   CATARACT EXTRACTION  2010, 2014   CATARACT EXTRACTION  02/28/14   CHOLECYSTECTOMY     CHOLECYSTECTOMY N/A 05/03/2012   Procedure: LAPAROSCOPIC CHOLECYSTECTOMY WITH INTRAOPERATIVE CHOLANGIOGRAM;  Surgeon: Lynwood MALVA Pina, MD;  Location: [facility] OR;  Service: General;  Laterality: N/A;   INSERT / REPLACE / REMOVE PACEMAKER  09/20/2016   PACEMAKER IMPLANT N/A 09/20/2016   Procedure: Pacemaker Implant;  Surgeon: Fernande Elspeth BROCKS, MD;  Location: [facility] INVASIVE CV LAB;  Service: Cardiovascular;  Laterality: N/A;   ROTATOR CUFF REPAIR     SHOULDER SURGERY     TONSILLECTOMY     Family History  Problem Relation Age of Onset   Cancer Mother        colon, breast, pancreas, skin cancer   Heart disease Father        heart valve replaced   Stroke Father    Diabetes Father    Cancer Paternal Grandmother        colon   Obesity Son    Heart disease Son        bradycardia   Social History   Socioeconomic History   Marital status: Widowed    Spouse name: Not on file   Number of children: Not on file   Years of education: Not on file   Highest education level: Not on file  Occupational History   Not on file  Tobacco Use   Smoking status: Never   Smokeless tobacco: Never  Vaping Use   Vaping status: Never Used  Substance and Sexual Activity   Alcohol  use: Yes    Alcohol /week: 1.0 standard drink of alcohol     Types: 1 Glasses of wine per week   Drug use: No   Sexual activity: Not on file    Comment: lives alone , no major dietary restrictions, retired as  production designer, 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  Other Topics Concern   Not on file  Social History Narrative   Not on file   Social Drivers of Health   Financial Resource Strain: Low Risk  (04/24/2023)   Overall Financial Resource Strain (CARDIA)    Difficulty of Paying Living Expenses: Not hard at all  Food Insecurity: No Food Insecurity (04/24/2023)   Hunger Vital Sign    Worried About Running Out of Food in the Last Year: Never true    Ran Out of Food in the Last Year: Never  true  Transportation Needs: No Transportation Needs (04/24/2023)   PRAPARE - Administrator, Civil Service (Medical): No    Lack of Transportation (Non-Medical): No  Physical Activity: Insufficiently Active (04/24/2023)   Exercise Vital Sign    Days of Exercise per Week: 5 days    Minutes of Exercise per Session: 20 min  Stress: No Stress Concern Present (04/24/2023)   [employer] of Occupational Health - Occupational Stress Questionnaire    Feeling of Stress : Not at all  Social Connections: Moderately Integrated (04/24/2023)   Social Connection and Isolation Panel [NHANES]    Frequency of Communication with Friends and Family: More than three times a week    Frequency of Social Gatherings with Friends and Family: More than three times a week    Attends Religious Services: More than 4 times per year    Active Member of [employer] or Organizations: Yes    Attends Banker Meetings: More than 4 times per year    Marital Status: Widowed    Tobacco Counseling Counseling given: Not Answered   Clinical Intake:  Pre-visit preparation completed: Yes  Pain : No/denies pain  BMI - recorded: 26.54 Nutritional Status: BMI 25 -29 Overweight Nutritional Risks: None Diabetes: Yes CBG done?: No Did pt. bring in CBG monitor from home?: No  How often do you need to have someone help you when you read instructions, pamphlets, or other written materials from your doctor or pharmacy?: 1 -  Never  Interpreter Needed?: No  Information entered by :: Jeoffrey Lease, CMA   Activities of Daily Living    04/24/2023    9:45 AM  In your present state of health, do you have any difficulty performing the following activities:  Hearing? 1  Vision? 0  Difficulty concentrating or making decisions? 0  Walking or climbing stairs? 1  Dressing or bathing? 0  Doing errands, shopping? 0  Preparing Food and eating ? N  Using the Toilet? N  In the past six months, have you accidently leaked urine? Y  Do you have problems with loss of bowel control? N  Managing your Medications? N  Managing your Finances? N  Housekeeping or managing your Housekeeping? N    Patient Care Team: Domenica Harlene LABOR, MD as PCP - General (Family Medicine) Mona Vinie BROCKS, MD as PCP - Cardiology (Cardiology) Fernande Elspeth BROCKS, MD as PCP - Electrophysiology (Cardiology) Octavia Charleston, MD as Consulting Physician (Ophthalmology) Joshua Sieving, MD as Consulting Physician (Dermatology) Maranda Leim DEL, MD as Consulting Physician (Cardiology) Anderson Maude ORN, MD (Inactive) as Consulting Physician (Orthopedic Surgery) Magdalen Pasco RAMAN, DPM as Consulting Physician (Podiatry) Carla Milling, RPH-CPP (Pharmacist)  Indicate any recent Medical Services you may have received from other than [facility] providers in the past year (date may be approximate).     Assessment:   This is a routine wellness examination for Muhamad.  Hearing/Vision screen No results found.   Goals Addressed   None    Depression Screen    04/24/2023    9:54 AM 12/02/2022    9:48 AM 07/30/2022   10:18 AM 02/20/2022   11:26 AM 01/15/2022   11:21 AM 10/02/2021   11:15 AM 06/19/2021   11:44 AM  PHQ 2/9 Scores  PHQ - 2 Score 0 0 0 0 0 0 0  PHQ- 9 Score   0 0 0      Fall Risk    04/24/2023    9:42 AM 12/02/2022  9:48 AM 07/30/2022   10:17 AM 02/20/2022   11:29 AM 01/15/2022   11:21 AM  Fall Risk   Falls in the past year? 0 0 0 0 0  Number  falls in past yr: 0 0 0 0 0  Injury with Fall? 0 0 0 0 0  Risk for fall due to : No Fall Risks   Impaired vision   Follow up Falls evaluation completed Falls evaluation completed Falls evaluation completed Falls prevention discussed Falls evaluation completed    MEDICARE RISK AT HOME: Medicare Risk at Home Any stairs in or around the home?: No If so, are there any without handrails?: No Home free of loose throw rugs in walkways, pet beds, electrical cords, etc?: Yes Adequate lighting in your home to reduce risk of falls?: Yes Life alert?: No Use of a cane, walker or w/c?: Yes Grab bars in the bathroom?: Yes Shower chair or bench in shower?: Yes Elevated toilet seat or a handicapped toilet?: Yes (comfort height)  TIMED UP AND GO:  Was the test performed?  No    Cognitive Function:        04/24/2023    9:56 AM  6CIT Screen  What Year? 0 points  What month? 0 points  What time? 0 points  Count back from 20 2 points  Months in reverse 0 points  Repeat phrase 0 points  Total Score 2 points    Immunizations Immunization History  Administered Date(s) Administered   Fluad Quad(high Dose 65+) 01/16/2021   Pneumococcal Conjugate-13 03/23/2015   Pneumococcal Polysaccharide-23 06/27/2011   Tdap 05/19/2014    TDAP status: Up to date  Flu Vaccine status: Due, Education has been provided regarding the importance of this vaccine. Advised may receive this vaccine at local pharmacy or Health Dept. Aware to provide a copy of the vaccination record if obtained from local pharmacy or Health Dept. Verbalized acceptance and understanding.  Pneumococcal vaccine status: Up to date  Covid-19 vaccine status: Information provided on how to obtain vaccines.   Qualifies for Shingles Vaccine? Yes   Zostavax completed No   Shingrix Completed?: No.    Education has been provided regarding the importance of this vaccine. Patient has been advised to call insurance company to determine out of  pocket expense if they have not yet received this vaccine. Advised may also receive vaccine at local pharmacy or Health Dept. Verbalized acceptance and understanding.  Screening Tests Health Maintenance  Topic Date Due   Zoster Vaccines- Shingrix (1 of 2) Never done   Medicare Annual Wellness (AWV)  02/21/2023   INFLUENZA VACCINE  06/16/2023 (Originally 10/17/2022)   FOOT EXAM  06/19/2023   HEMOGLOBIN A1C  08/12/2023   OPHTHALMOLOGY EXAM  08/14/2023   DTaP/Tdap/Td (2 - Td or Tdap) 05/18/2024   Pneumonia Vaccine 68+ Years old  Completed   HPV VACCINES  Aged Out   COVID-19 Vaccine  Discontinued    Health Maintenance  Health Maintenance Due  Topic Date Due   Zoster Vaccines- Shingrix (1 of 2) Never done   Medicare Annual Wellness (AWV)  02/21/2023    Colorectal cancer screening: No longer required.   Lung Cancer Screening: (Low Dose CT Chest recommended if Age 33-80 years, 20 pack-year currently smoking OR have quit w/in 15years.) does not qualify.   Additional Screening:  Hepatitis C Screening: does not qualify  Vision Screening: Recommended annual ophthalmology exams for early detection of glaucoma and other disorders of the eye. Is the patient up to date  with their annual eye exam?  Yes  Who is the provider or what is the name of the office in which the patient attends annual eye exams? Dr. Octavia If pt is not established with a provider, would they like to be referred to a provider to establish care? No .   Dental Screening: Recommended annual dental exams for proper oral hygiene  Diabetic Foot Exam: Diabetic Foot Exam: Completed 06/19/22  Community Resource Referral / Chronic Care Management: CRR required this visit?  No   CCM required this visit?  No     Plan:     I have personally reviewed and noted the following in the patient's chart:   Medical and social history Use of alcohol , tobacco or illicit drugs  Current medications and supplements including opioid  prescriptions. Patient is not currently taking opioid prescriptions. Functional ability and status Nutritional status Physical activity Advanced directives List of other physicians Hospitalizations, surgeries, and ER visits in previous 12 months Vitals Screenings to include cognitive, depression, and falls Referrals and appointments  In addition, I have reviewed and discussed with patient certain preventive protocols, quality metrics, and best practice recommendations. A written personalized care plan for preventive services as well as general preventive health recommendations were provided to patient.     Kandis Gauze, CMA   04/24/2023   After Visit Summary: (MyChart) Due to this being a telephonic visit, the after visit summary with patients personalized plan was offered to patient via MyChart   Nurse Notes: None

## 2023-04-24 NOTE — Patient Instructions (Signed)
 [name] , Thank you for taking time to come for your Medicare Wellness Visit. I appreciate your ongoing commitment to your health goals. Please review the following plan we discussed and let me know if I can assist you in the future.     This is a list of the screening recommended for you and due dates:  Health Maintenance  Topic Date Due   Zoster (Shingles) Vaccine (1 of 2) Never done   Flu Shot  06/16/2023*   Complete foot exam   06/19/2023   Hemoglobin A1C  08/12/2023   Eye exam for diabetics  08/14/2023   Medicare Annual Wellness Visit  04/23/2024   DTaP/Tdap/Td vaccine (2 - Td or Tdap) 05/18/2024   Pneumonia Vaccine  Completed   HPV Vaccine  Aged Out   COVID-19 Vaccine  Discontinued  *Topic was postponed. The date shown is not the original due date.    Next appointment: Follow up in one year for your annual wellness visit.   Preventive Care 47 Years and Older, Male Preventive care refers to lifestyle choices and visits with your health care provider that can promote health and wellness. What does preventive care include? A yearly physical exam. This is also called an annual well check. Dental exams once or twice a year. Routine eye exams. Ask your health care provider how often you should have your eyes checked. Personal lifestyle choices, including: Daily care of your teeth and gums. Regular physical activity. Eating a healthy diet. Avoiding tobacco and drug use. Limiting alcohol  use. Practicing safe sex. Taking low doses of aspirin  every day. Taking vitamin and mineral supplements as recommended by your health care provider. What happens during an annual well check? The services and screenings done by your health care provider during your annual well check will depend on your age, overall health, lifestyle risk factors, and family history of disease. Counseling  Your health care provider may ask you questions about your: Alcohol  use. Tobacco use. Drug  use. Emotional well-being. Home and relationship well-being. Sexual activity. Eating habits. History of falls. Memory and ability to understand (cognition). Work and work astronomer. Screening  You may have the following tests or measurements: Height, weight, and BMI. Blood pressure. Lipid and cholesterol levels. These may be checked every 5 years, or more frequently if you are over 71 years old. Skin check. Lung cancer screening. You may have this screening every year starting at age 6 if you have a 30-pack-year history of smoking and currently smoke or have quit within the past 15 years. Fecal occult blood test (FOBT) of the stool. You may have this test every year starting at age 52. Flexible sigmoidoscopy or colonoscopy. You may have a sigmoidoscopy every 5 years or a colonoscopy every 10 years starting at age 40. Prostate cancer screening. Recommendations will vary depending on your family history and other risks. Hepatitis C blood test. Hepatitis B blood test. Sexually transmitted disease (STD) testing. Diabetes screening. This is done by checking your blood sugar (glucose) after you have not eaten for a while (fasting). You may have this done every 1-3 years. Abdominal aortic aneurysm (AAA) screening. You may need this if you are a current or former smoker. Osteoporosis. You may be screened starting at age 8 if you are at high risk. Talk with your health care provider about your test results, treatment options, and if necessary, the need for more tests. Vaccines  Your health care provider may recommend certain vaccines, such as: Influenza vaccine. This  is recommended every year. Tetanus, diphtheria, and acellular pertussis (Tdap, Td) vaccine. You may need a Td booster every 10 years. Zoster vaccine. You may need this after age 32. Pneumococcal 13-valent conjugate (PCV13) vaccine. One dose is recommended after age 24. Pneumococcal polysaccharide (PPSV23) vaccine. One dose is  recommended after age 67. Talk to your health care provider about which screenings and vaccines you need and how often you need them. This information is not intended to replace advice given to you by your health care provider. Make sure you discuss any questions you have with your health care provider. Document Released: 03/31/2015 Document Revised: 11/22/2015 Document Reviewed: 01/03/2015 Elsevier Interactive Patient Education  2017 [employer].  Fall Prevention in the Home Falls can cause injuries. They can happen to people of all ages. There are many things you can do to make your home safe and to help prevent falls. What can I do on the outside of my home? Regularly fix the edges of walkways and driveways and fix any cracks. Remove anything that might make you trip as you walk through a door, such as a raised step or threshold. Trim any bushes or trees on the path to your home. Use bright outdoor lighting. Clear any walking paths of anything that might make someone trip, such as rocks or tools. Regularly check to see if handrails are loose or broken. Make sure that both sides of any steps have handrails. Any raised decks and porches should have guardrails on the edges. Have any leaves, snow, or ice cleared regularly. Use sand or salt on walking paths during winter. Clean up any spills in your garage right away. This includes oil or grease spills. What can I do in the bathroom? Use night lights. Install grab bars by the toilet and in the tub and shower. Do not use towel bars as grab bars. Use non-skid mats or decals in the tub or shower. If you need to sit down in the shower, use a plastic, non-slip stool. Keep the floor dry. Clean up any water that spills on the floor as soon as it happens. Remove soap buildup in the tub or shower regularly. Attach bath mats securely with double-sided non-slip rug tape. Do not have throw rugs and other things on the floor that can make you  trip. What can I do in the bedroom? Use night lights. Make sure that you have a light by your bed that is easy to reach. Do not use any sheets or blankets that are too big for your bed. They should not hang down onto the floor. Have a firm chair that has side arms. You can use this for support while you get dressed. Do not have throw rugs and other things on the floor that can make you trip. What can I do in the kitchen? Clean up any spills right away. Avoid walking on wet floors. Keep items that you use a lot in easy-to-reach places. If you need to reach something above you, use a strong step stool that has a grab bar. Keep electrical cords out of the way. Do not use floor polish or wax that makes floors slippery. If you must use wax, use non-skid floor wax. Do not have throw rugs and other things on the floor that can make you trip. What can I do with my stairs? Do not leave any items on the stairs. Make sure that there are handrails on both sides of the stairs and use them. Fix handrails that  are broken or loose. Make sure that handrails are as long as the stairways. Check any carpeting to make sure that it is firmly attached to the stairs. Fix any carpet that is loose or worn. Avoid having throw rugs at the top or bottom of the stairs. If you do have throw rugs, attach them to the floor with carpet tape. Make sure that you have a light switch at the top of the stairs and the bottom of the stairs. If you do not have them, ask someone to add them for you. What else can I do to help prevent falls? Wear shoes that: Do not have high heels. Have rubber bottoms. Are comfortable and fit you well. Are closed at the toe. Do not wear sandals. If you use a stepladder: Make sure that it is fully opened. Do not climb a closed stepladder. Make sure that both sides of the stepladder are locked into place. Ask someone to hold it for you, if possible. Clearly mark and make sure that you can  see: Any grab bars or handrails. First and last steps. Where the edge of each step is. Use tools that help you move around (mobility aids) if they are needed. These include: Canes. Walkers. Scooters. Crutches. Turn on the lights when you go into a dark area. Replace any light bulbs as soon as they burn out. Set up your furniture so you have a clear path. Avoid moving your furniture around. If any of your floors are uneven, fix them. If there are any pets around you, be aware of where they are. Review your medicines with your doctor. Some medicines can make you feel dizzy. This can increase your chance of falling. Ask your doctor what other things that you can do to help prevent falls. This information is not intended to replace advice given to you by your health care provider. Make sure you discuss any questions you have with your health care provider. Document Released: 12/29/2008 Document Revised: 08/10/2015 Document Reviewed: 04/08/2014 Elsevier Interactive Patient Education  2017 [employer].

## 2023-04-26 ENCOUNTER — Other Ambulatory Visit: Payer: Self-pay | Admitting: Family Medicine

## 2023-05-06 NOTE — Progress Notes (Signed)
 Remote pacemaker transmission.

## 2023-05-06 NOTE — Addendum Note (Signed)
Addended by: Geralyn Flash D on: 05/06/2023 03:00 PM   Modules accepted: Orders

## 2023-05-14 ENCOUNTER — Other Ambulatory Visit: Payer: Self-pay | Admitting: Family Medicine

## 2023-05-16 ENCOUNTER — Other Ambulatory Visit: Payer: Self-pay | Admitting: Family Medicine

## 2023-05-18 ENCOUNTER — Encounter: Payer: Self-pay | Admitting: Family Medicine

## 2023-05-18 ENCOUNTER — Other Ambulatory Visit: Payer: Self-pay | Admitting: Family Medicine

## 2023-05-19 ENCOUNTER — Other Ambulatory Visit: Payer: Self-pay

## 2023-05-19 MED ORDER — SITAGLIPTIN PHOSPHATE 25 MG PO TABS: 25.0000 mg | ORAL_TABLET | Freq: Every day | ORAL | 3 refills | Status: DC

## 2023-05-19 MED ORDER — EMPAGLIFLOZIN 25 MG PO TABS: 25.0000 mg | ORAL_TABLET | Freq: Every day | ORAL | 3 refills | Status: DC

## 2023-05-19 MED ORDER — EMPAGLIFLOZIN 25 MG PO TABS
25.0000 mg | ORAL_TABLET | Freq: Every day | ORAL | 3 refills | Status: DC
  Filled 2023-05-19: qty 30, 30d supply, fill #0

## 2023-06-06 DIAGNOSIS — I4891 Unspecified atrial fibrillation: Secondary | ICD-10-CM | POA: Diagnosis not present

## 2023-06-06 DIAGNOSIS — N1832 Chronic kidney disease, stage 3b: Secondary | ICD-10-CM | POA: Diagnosis not present

## 2023-06-06 DIAGNOSIS — E1122 Type 2 diabetes mellitus with diabetic chronic kidney disease: Secondary | ICD-10-CM | POA: Diagnosis not present

## 2023-06-06 DIAGNOSIS — I129 Hypertensive chronic kidney disease with stage 1 through stage 4 chronic kidney disease, or unspecified chronic kidney disease: Secondary | ICD-10-CM | POA: Diagnosis not present

## 2023-06-06 DIAGNOSIS — N2581 Secondary hyperparathyroidism of renal origin: Secondary | ICD-10-CM | POA: Diagnosis not present

## 2023-06-06 DIAGNOSIS — I509 Heart failure, unspecified: Secondary | ICD-10-CM | POA: Diagnosis not present

## 2023-06-07 LAB — LAB REPORT - SCANNED: EGFR: 35

## 2023-06-19 DIAGNOSIS — D0462 Carcinoma in situ of skin of left upper limb, including shoulder: Secondary | ICD-10-CM | POA: Diagnosis not present

## 2023-06-19 DIAGNOSIS — D225 Melanocytic nevi of trunk: Secondary | ICD-10-CM | POA: Diagnosis not present

## 2023-06-19 DIAGNOSIS — D485 Neoplasm of uncertain behavior of skin: Secondary | ICD-10-CM | POA: Diagnosis not present

## 2023-06-19 DIAGNOSIS — C44329 Squamous cell carcinoma of skin of other parts of face: Secondary | ICD-10-CM | POA: Diagnosis not present

## 2023-06-19 DIAGNOSIS — C44321 Squamous cell carcinoma of skin of nose: Secondary | ICD-10-CM | POA: Diagnosis not present

## 2023-06-19 DIAGNOSIS — C4442 Squamous cell carcinoma of skin of scalp and neck: Secondary | ICD-10-CM | POA: Diagnosis not present

## 2023-06-19 DIAGNOSIS — Z85828 Personal history of other malignant neoplasm of skin: Secondary | ICD-10-CM | POA: Diagnosis not present

## 2023-06-19 DIAGNOSIS — L821 Other seborrheic keratosis: Secondary | ICD-10-CM | POA: Diagnosis not present

## 2023-06-19 DIAGNOSIS — L905 Scar conditions and fibrosis of skin: Secondary | ICD-10-CM | POA: Diagnosis not present

## 2023-06-22 ENCOUNTER — Other Ambulatory Visit: Payer: Self-pay | Admitting: Family Medicine

## 2023-06-24 ENCOUNTER — Ambulatory Visit: Payer: PPO | Admitting: Podiatry

## 2023-06-24 ENCOUNTER — Encounter: Payer: Self-pay | Admitting: Podiatry

## 2023-06-24 DIAGNOSIS — E119 Type 2 diabetes mellitus without complications: Secondary | ICD-10-CM | POA: Diagnosis not present

## 2023-06-24 DIAGNOSIS — M2141 Flat foot [pes planus] (acquired), right foot: Secondary | ICD-10-CM | POA: Diagnosis not present

## 2023-06-24 DIAGNOSIS — B351 Tinea unguium: Secondary | ICD-10-CM

## 2023-06-24 DIAGNOSIS — M79674 Pain in right toe(s): Secondary | ICD-10-CM | POA: Diagnosis not present

## 2023-06-24 DIAGNOSIS — N183 Chronic kidney disease, stage 3 unspecified: Secondary | ICD-10-CM

## 2023-06-24 DIAGNOSIS — E0822 Diabetes mellitus due to underlying condition with diabetic chronic kidney disease: Secondary | ICD-10-CM

## 2023-06-24 DIAGNOSIS — M2142 Flat foot [pes planus] (acquired), left foot: Secondary | ICD-10-CM

## 2023-06-24 DIAGNOSIS — M79675 Pain in left toe(s): Secondary | ICD-10-CM | POA: Diagnosis not present

## 2023-06-24 DIAGNOSIS — R234 Changes in skin texture: Secondary | ICD-10-CM | POA: Diagnosis not present

## 2023-06-24 NOTE — Progress Notes (Signed)
 ANNUAL DIABETIC FOOT EXAM  Subjective: Vincent Allen presents today for annual diabetic foot exam.  Chief Complaint  Patient presents with   Diabetes    Patient A1c was 115, Patient states his PCP name is Dr.[name] and he last saw her three months ago and goes to see her again on the 28th   Patient confirms h/o diabetes.   Patient denies any h/o foot wounds. He is c/o heel pain left LE on the posterior aspect of the heel.  Vincent Balk, MD is patient's PCP.  Past Medical History:  Diagnosis Date   Arthritis    Bradycardia    Cataract    Chronic kidney disease stage III (GFR 30-59 ml/min) 05/03/2012   Colon polyps    Complication of anesthesia    emesis   Diabetes mellitus    GERD (gastroesophageal reflux disease)    HOH (hard of hearing)    HTN (hypertension) 01/13/2013   Hyperlipidemia    Hypertension    Pacemaker 12/2016   Persistent atrial fibrillation (HCC)    Presence of permanent cardiac pacemaker 09/20/2016   Symptomatic bradycardia    Vitamin D deficiency 07/17/2015   Patient Active Problem List   Diagnosis Date Noted   Recurrent falls 07/31/2022   Permanent atrial fibrillation (HCC) 05/24/2021   Malnutrition of moderate degree 04/27/2021   Unilateral primary osteoarthritis, right knee 02/01/2021   Fatty liver 12/01/2020   Pedal edema 08/14/2020   Knee pain, bilateral 09/20/2019   Overweight 09/14/2019   Urinary frequency 11/06/2016   S/P placement of cardiac pacemaker 10/24/2016   Chronic diastolic CHF (congestive heart failure) (HCC) 10/24/2016   Cardiac device in situ    Debility 08/15/2016   Vitamin D deficiency 07/17/2015   Preventative health care 07/17/2015   Lumbago 12/02/2014   Pain of toe of left foot 03/14/2014   Essential hypertension 01/13/2013   Insomnia 06/27/2011   Osteopenia 09/02/2010   Family history of colon cancer 09/02/2010   Diabetes mellitus with chronic kidney disease (HCC)    Hyperlipidemia LDL goal <70    CTS  (carpal tunnel syndrome) 08/28/2010   Past Surgical History:  Procedure Laterality Date   ANKLE ARTHROPLASTY     BIV PACEMAKER INSERTION CRT-P N/A 06/22/2021   Procedure: UPGRADE TO BIV PACEMAKER INSERTION CRT-P;  Surgeon: Verona Goodwill, MD;  Location: [facility] INVASIVE CV LAB;  Service: Cardiovascular;  Laterality: N/A;   CARDIOVERSION N/A 08/20/2016   Procedure: CARDIOVERSION;  Surgeon: Luana Rumple, MD;  Location: [facility] ENDOSCOPY;  Service: Cardiovascular;  Laterality: N/A;   CATARACT EXTRACTION  2010, 2014   CATARACT EXTRACTION  02/28/14   CHOLECYSTECTOMY     CHOLECYSTECTOMY N/A 05/03/2012   Procedure: LAPAROSCOPIC CHOLECYSTECTOMY WITH INTRAOPERATIVE CHOLANGIOGRAM;  Surgeon: Diantha Fossa, MD;  Location: [facility] OR;  Service: General;  Laterality: N/A;   INSERT / REPLACE / REMOVE PACEMAKER  09/20/2016   PACEMAKER IMPLANT N/A 09/20/2016   Procedure: Pacemaker Implant;  Surgeon: Verona Goodwill, MD;  Location: [facility] INVASIVE CV LAB;  Service: Cardiovascular;  Laterality: N/A;   ROTATOR CUFF REPAIR     SHOULDER SURGERY     TONSILLECTOMY     Current Outpatient Medications on File Prior to Visit  Medication Sig Dispense Refill   acetaminophen (TYLENOL) 500 MG tablet Take 500 mg by mouth every 6 (six) hours as needed for moderate pain.     apixaban (ELIQUIS) 2.5 MG TABS tablet Take 1 tablet (2.5 mg total) by mouth 2 (two) times daily. 180  tablet 1   b complex vitamins capsule Take 1 capsule by mouth daily.     empagliflozin (JARDIANCE) 25 MG TABS tablet Take 1 tablet (25 mg total) by mouth daily. 30 tablet 3   ezetimibe (ZETIA) 10 MG tablet TAKE 1 TABLET BY MOUTH EVERY DAY 90 tablet 3   furosemide (LASIX) 40 MG tablet TAKE 1 TABLET BY MOUTH TWICE DAILY FOR 5 DAYS THEN DAILY THEREAFTER 180 tablet 1   glipiZIDE (GLUCOTROL) 5 MG tablet 1 tab po BID 180 tablet 1   glucose blood (ONE TOUCH ULTRA TEST) test strip USE 1 STRIP 2 TIMES DAILY TO CHECK BLOOD SUGAR DX E08.22, N18.3 300 each 1   Krill Oil 1000 MG CAPS  Take 1,000 mg by mouth daily.     losartan (COZAAR) 25 MG tablet TAKE 1 TABLET (25 MG TOTAL) BY MOUTH DAILY. 90 tablet 3   Multiple Vitamin (MULTIVITAMIN WITH MINERALS) TABS tablet Take 1 tablet by mouth daily.     potassium chloride SA (KLOR-CON M) 20 MEQ tablet Take 10 mEq by mouth daily.     sitaGLIPtin (JANUVIA) 25 MG tablet Take 1 tablet (25 mg total) by mouth daily. 30 tablet 3   Zinc 50 MG TABS Take 50 mg by mouth daily.     No current facility-administered medications on file prior to visit.    Allergies  Allergen Reactions   Atorvastatin     Myalgias / leg pain and weakness   Penicillins Hives and Itching    Has patient had a PCN reaction causing immediate rash, facial/tongue/throat swelling, SOB or lightheadedness with hypotension:Yes Has patient had a PCN reaction causing severe rash involving mucus membranes or skin necrosis:No Has patient had a PCN reaction that required hospitalization:No Has patient had a PCN reaction occurring within the last 10 years:No If all of the above answers are "NO", then may proceed with Cephalosporin use.    Verapamil     Junctional rhythm   Influenza Vaccines Rash    tinnitus   Social History   Occupational History   Not on file  Tobacco Use   Smoking status: Never   Smokeless tobacco: Never  Vaping Use   Vaping status: Never Used  Substance and Sexual Activity   Alcohol use: Yes    Alcohol/week: 1.0 standard drink of alcohol    Types: 1 Glasses of wine per week   Drug use: No   Sexual activity: Not on file    Comment: lives alone , no major dietary restrictions, 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.   Family History  Problem Relation Age of Onset   Cancer Mother        colon, breast, pancreas, skin cancer   Heart disease Father        heart valve replaced   Stroke Father    Diabetes Father    Cancer Paternal Grandmother        colon   Obesity Son    Heart disease Son        bradycardia    Immunization History  Administered Date(s) Administered   Fluad Quad(high Dose 65+) 01/16/2021   Pneumococcal Conjugate-13 03/23/2015   Pneumococcal Polysaccharide-23 06/27/2011   Tdap 05/19/2014     Review of Systems: Negative except as noted in the HPI.   Objective: There were no vitals filed for this visit.  Vincent Allen is a pleasant 89 y.o. male in NAD. AAO X 3.  Diabetic foot exam was performed with  the following findings:   Normal sensation of 10g monofilament Intact posterior tibialis and dorsalis pedis pulses Vascular Examination: Pedal hair sparse. No pain with calf compression b/l. Trace edema noted BLE.  Dermatological Examination: No open wounds b/l LE. Longitudinal hyperkeratotic fissuring of skin noted left posterior heel. Tenderness to palpation. No erythema, no edema, no drainage, no flocculence. No interdigital macerations noted b/l LE. Toenails 1-5 bilaterally elongated, discolored, dystrophic, thickened, and crumbly with subungual debris and tenderness to dorsal palpation. No hyperkeratotic nor porokeratotic lesions present on today's visit.  Musculoskeletal Examination: Muscle strength 5/5 to all lower extremity muscle groups bilaterally. Pes planus deformity noted bilateral LE.      Lab Results  Component Value Date   HGBA1C 6.7 02/07/2023   ADA Risk Categorization: Low Risk :  Patient has all of the following: Intact protective sensation No prior foot ulcer  No severe deformity Pedal pulses present  Assessment: 1. Pain due to onychomycosis of toenails of both feet   2. Fissure in skin of left foot   3. Pes planus of both feet   4. Diabetes mellitus due to underlying condition with stage 3 chronic kidney disease, unspecified whether long term insulin use, unspecified whether stage 3a or 3b CKD (HCC)   5. Encounter for diabetic foot exam (HCC)     Plan: Diabetic foot examination performed today.  All patient's and/or POA's  questions/concerns addressed on today's visit. Heel fissure gently filed with dremel without incident. Area cleansed with wound cleanser and alcohol. TAO and band-aid applied. Patient instructed to moisturize and protect with TAO daily. Follow up with Dr. Wyn Heater in one week. Mycotic toenails 1-5 debrided in length and girth without incident. Continue daily foot inspections and monitor blood glucose per PCP/Endocrinologist's recommendations. Continue soft, supportive shoe gear daily. Report any pedal injuries to medical professional. Call office if there are any questions/concerns. -Patient/POA to call should there be question/concern in the interim. Return in about 3 months (around 09/23/2023).  Vincent Allen, DPM      [place] LOCATION: [address], [place] [address]                   Office [phone]   [facility] LOCATION: [address] [place], [place] [address] Office [phone]

## 2023-06-25 ENCOUNTER — Ambulatory Visit (INDEPENDENT_AMBULATORY_CARE_PROVIDER_SITE_OTHER): Payer: PPO

## 2023-06-25 DIAGNOSIS — I447 Left bundle-branch block, unspecified: Secondary | ICD-10-CM

## 2023-06-25 LAB — CUP PACEART REMOTE DEVICE CHECK
Battery Remaining Longevity: 97 mo
Battery Voltage: 2.99 V
Brady Statistic RA Percent Paced: 0 %
Brady Statistic RV Percent Paced: 99.63 %
Date Time Interrogation Session: 20250408222811
Implantable Lead Connection Status: 753985
Implantable Lead Connection Status: 753985
Implantable Lead Connection Status: 753985
Implantable Lead Implant Date: 20180706
Implantable Lead Implant Date: 20180706
Implantable Lead Implant Date: 20230407
Implantable Lead Location: 753858
Implantable Lead Location: 753859
Implantable Lead Location: 753860
Implantable Lead Model: 5076
Implantable Lead Model: 5076
Implantable Pulse Generator Implant Date: 20230407
Lead Channel Impedance Value: 1026 Ohm
Lead Channel Impedance Value: 1140 Ohm
Lead Channel Impedance Value: 1140 Ohm
Lead Channel Impedance Value: 1197 Ohm
Lead Channel Impedance Value: 1235 Ohm
Lead Channel Impedance Value: 1311 Ohm
Lead Channel Impedance Value: 323 Ohm
Lead Channel Impedance Value: 380 Ohm
Lead Channel Impedance Value: 399 Ohm
Lead Channel Impedance Value: 437 Ohm
Lead Channel Impedance Value: 532 Ohm
Lead Channel Impedance Value: 627 Ohm
Lead Channel Impedance Value: 741 Ohm
Lead Channel Impedance Value: 741 Ohm
Lead Channel Pacing Threshold Amplitude: 0.875 V
Lead Channel Pacing Threshold Amplitude: 1.375 V
Lead Channel Pacing Threshold Pulse Width: 0.4 ms
Lead Channel Pacing Threshold Pulse Width: 0.8 ms
Lead Channel Sensing Intrinsic Amplitude: 0.625 mV
Lead Channel Sensing Intrinsic Amplitude: 0.625 mV
Lead Channel Sensing Intrinsic Amplitude: 7 mV
Lead Channel Sensing Intrinsic Amplitude: 7 mV
Lead Channel Setting Pacing Amplitude: 2 V
Lead Channel Setting Pacing Amplitude: 2 V
Lead Channel Setting Pacing Pulse Width: 0.4 ms
Lead Channel Setting Pacing Pulse Width: 0.8 ms
Lead Channel Setting Sensing Sensitivity: 1.2 mV
Zone Setting Status: 755011
Zone Setting Status: 755011

## 2023-06-28 ENCOUNTER — Other Ambulatory Visit: Payer: Self-pay | Admitting: Family Medicine

## 2023-06-28 DIAGNOSIS — I4819 Other persistent atrial fibrillation: Secondary | ICD-10-CM

## 2023-07-01 ENCOUNTER — Encounter: Payer: Self-pay | Admitting: Podiatry

## 2023-07-01 ENCOUNTER — Ambulatory Visit: Admitting: Podiatry

## 2023-07-01 DIAGNOSIS — E11621 Type 2 diabetes mellitus with foot ulcer: Secondary | ICD-10-CM | POA: Diagnosis not present

## 2023-07-01 DIAGNOSIS — R234 Changes in skin texture: Secondary | ICD-10-CM | POA: Diagnosis not present

## 2023-07-01 NOTE — Progress Notes (Signed)
 Chief Complaint  Patient presents with   Skin Problem    Follow up heel fissure left   "Its looking better. I've been using neosporin and a bandaid on it every day"   HPI: 89 y.o. male presents today, after seeing Dr. Johnette Naegeli for approximately 1 week ago, for evaluation and treatment of a fissure on the posterior left heel.  This was seen by her on his last visit and she referred him for follow-up.  Patient thinks it may have been present for 1 to 2 months prior to his appointment with Dr. Johnette Naegeli.  He denies any pain today.  A family member is with him today and states that it looks improved since last week.  They have been putting antibiotic ointment and a Band-Aid on the area.  His last A1c on 02/07/2023 was 6.7  Past Medical History:  Diagnosis Date   Arthritis    Bradycardia    Cataract    Chronic kidney disease stage III (GFR 30-59 ml/min) 05/03/2012   Colon polyps    Complication of anesthesia    emesis   Diabetes mellitus    GERD (gastroesophageal reflux disease)    HOH (hard of hearing)    HTN (hypertension) 01/13/2013   Hyperlipidemia    Hypertension    Pacemaker 12/2016   Persistent atrial fibrillation (HCC)    Presence of permanent cardiac pacemaker 09/20/2016   Symptomatic bradycardia    Vitamin D  deficiency 07/17/2015    Past Surgical History:  Procedure Laterality Date   ANKLE ARTHROPLASTY     BIV PACEMAKER INSERTION CRT-P N/A 06/22/2021   Procedure: UPGRADE TO BIV PACEMAKER INSERTION CRT-P;  Surgeon: Verona Goodwill, MD;  Location: [facility] INVASIVE CV LAB;  Service: Cardiovascular;  Laterality: N/A;   CARDIOVERSION N/A 08/20/2016   Procedure: CARDIOVERSION;  Surgeon: Luana Rumple, MD;  Location: [facility] ENDOSCOPY;  Service: Cardiovascular;  Laterality: N/A;   CATARACT EXTRACTION  2010, 2014   CATARACT EXTRACTION  02/28/14   CHOLECYSTECTOMY     CHOLECYSTECTOMY N/A 05/03/2012   Procedure: LAPAROSCOPIC CHOLECYSTECTOMY WITH INTRAOPERATIVE CHOLANGIOGRAM;  Surgeon: Diantha Fossa, MD;  Location: [facility] OR;  Service: General;  Laterality: N/A;   INSERT / REPLACE / REMOVE PACEMAKER  09/20/2016   PACEMAKER IMPLANT N/A 09/20/2016   Procedure: Pacemaker Implant;  Surgeon: Verona Goodwill, MD;  Location: [facility] INVASIVE CV LAB;  Service: Cardiovascular;  Laterality: N/A;   ROTATOR CUFF REPAIR     SHOULDER SURGERY     TONSILLECTOMY      Allergies  Allergen Reactions   Atorvastatin      Myalgias / leg pain and weakness   Penicillins Hives and Itching    Has patient had a PCN reaction causing immediate rash, facial/tongue/throat swelling, SOB or lightheadedness with hypotension:Yes Has patient had a PCN reaction causing severe rash involving mucus membranes or skin necrosis:No Has patient had a PCN reaction that required hospitalization:No Has patient had a PCN reaction occurring within the last 10 years:No If all of the above answers are "NO", then may proceed with Cephalosporin use.    Verapamil      Junctional rhythm   Influenza Vaccines Rash    tinnitus    Physical Exam: Trace palpable pedal pulses.  Digital hair is sparse.  There is a horizontal fissure on the periphery of the posterior aspect of the left heel.  This is approximately 1.4 cm in length x 0.1 cm in width x 0.1 cm in depth.  There is a  granular base.  There is hyperkeratosis along the periphery of the fissure/wound.  No active drainage is noted.  No clinical signs of infection are seen.  There is no surrounding erythema, necrosis, or purulence noted  Assessment/Plan of Care: 1. Fissure in skin of left foot   2. Type 2 diabetes mellitus with foot ulcer (CODE) (HCC)    Discussed clinical findings with patient today.  The slight hyperkeratosis along the periphery of the left heel fissure was shaved and Steri-Strips were applied across the fissure, and antibiotic ointment and a Biatain silicone dressing was applied.  He may leave this on for up to 2 days.  Leave the Steri-Strips on until they fall off on  their own.  After removing the silicone dressing, he can go back to daily dressing changes with antibiotic ointment and either a large Band-Aid or gauze dressing.  He was given the extra Steri-Strips today to use at home, if they need replaced.    Follow-up in 2 weeks   Ineze Serrao DEstle Hemp, DPM, FACFAS [facility]     [address] [place], [place] [address]                Office [phone]  Fax [phone]

## 2023-07-05 ENCOUNTER — Encounter: Payer: Self-pay | Admitting: Internal Medicine

## 2023-07-13 NOTE — Assessment & Plan Note (Signed)
 Encourage heart healthy diet such as MIND or DASH diet, increase exercise, avoid trans fats, simple carbohydrates and processed foods, consider a krill or fish or flaxseed oil cap daily.

## 2023-07-13 NOTE — Assessment & Plan Note (Signed)
 Supplement and monitor

## 2023-07-13 NOTE — Assessment & Plan Note (Signed)
 hgba1c acceptable, minimize simple carbs. Increase exercise as tolerated. Continue current meds

## 2023-07-13 NOTE — Assessment & Plan Note (Signed)
 Well controlled, no changes to meds. Encouraged heart healthy diet such as the DASH diet and exercise as tolerated.

## 2023-07-13 NOTE — Assessment & Plan Note (Signed)
Tolerating eliquis, rate controlled 

## 2023-07-13 NOTE — Assessment & Plan Note (Signed)
No recent exacerbation, no changes 

## 2023-07-13 NOTE — Assessment & Plan Note (Signed)
 Encouraged good sleep hygiene such as dark, quiet room. No blue/green glowing lights such as computer screens in bedroom. No alcohol or stimulants in evening. Cut down on caffeine as able. Regular exercise is helpful but not just prior to bed time.

## 2023-07-14 ENCOUNTER — Ambulatory Visit: Admitting: Podiatry

## 2023-07-14 ENCOUNTER — Encounter: Payer: Self-pay | Admitting: Family Medicine

## 2023-07-14 ENCOUNTER — Ambulatory Visit (INDEPENDENT_AMBULATORY_CARE_PROVIDER_SITE_OTHER): Payer: PPO | Admitting: Family Medicine

## 2023-07-14 VITALS — BP 132/84 | HR 78 | Temp 98.0°F | Resp 16 | Ht 70.0 in | Wt 188.2 lb

## 2023-07-14 DIAGNOSIS — E0822 Diabetes mellitus due to underlying condition with diabetic chronic kidney disease: Secondary | ICD-10-CM | POA: Diagnosis not present

## 2023-07-14 DIAGNOSIS — E11621 Type 2 diabetes mellitus with foot ulcer: Secondary | ICD-10-CM

## 2023-07-14 DIAGNOSIS — G4700 Insomnia, unspecified: Secondary | ICD-10-CM | POA: Diagnosis not present

## 2023-07-14 DIAGNOSIS — R234 Changes in skin texture: Secondary | ICD-10-CM

## 2023-07-14 DIAGNOSIS — I10 Essential (primary) hypertension: Secondary | ICD-10-CM | POA: Diagnosis not present

## 2023-07-14 DIAGNOSIS — S91302D Unspecified open wound, left foot, subsequent encounter: Secondary | ICD-10-CM | POA: Diagnosis not present

## 2023-07-14 DIAGNOSIS — I5032 Chronic diastolic (congestive) heart failure: Secondary | ICD-10-CM | POA: Diagnosis not present

## 2023-07-14 DIAGNOSIS — E559 Vitamin D deficiency, unspecified: Secondary | ICD-10-CM

## 2023-07-14 DIAGNOSIS — E785 Hyperlipidemia, unspecified: Secondary | ICD-10-CM

## 2023-07-14 DIAGNOSIS — N183 Chronic kidney disease, stage 3 unspecified: Secondary | ICD-10-CM

## 2023-07-14 DIAGNOSIS — I4821 Permanent atrial fibrillation: Secondary | ICD-10-CM | POA: Diagnosis not present

## 2023-07-14 NOTE — Patient Instructions (Signed)
 Hypertension, Adult High blood pressure (hypertension) is when the force of blood pumping through the arteries is too strong. The arteries are the blood vessels that carry blood from the heart throughout the body. Hypertension forces the heart to work harder to pump blood and may cause arteries to become narrow or stiff. Untreated or uncontrolled hypertension can lead to a heart attack, heart failure, a stroke, kidney disease, and other problems. A blood pressure reading consists of a higher number over a lower number. Ideally, your blood pressure should be below 120/80. The first ("top") number is called the systolic pressure. It is a measure of the pressure in your arteries as your heart beats. The second ("bottom") number is called the diastolic pressure. It is a measure of the pressure in your arteries as the heart relaxes. What are the causes? The exact cause of this condition is not known. There are some conditions that result in high blood pressure. What increases the risk? Certain factors may make you more likely to develop high blood pressure. Some of these risk factors are under your control, including: Smoking. Not getting enough exercise or physical activity. Being overweight. Having too much fat, sugar, calories, or salt (sodium) in your diet. Drinking too much alcohol. Other risk factors include: Having a personal history of heart disease, diabetes, high cholesterol, or kidney disease. Stress. Having a family history of high blood pressure and high cholesterol. Having obstructive sleep apnea. Age. The risk increases with age. What are the signs or symptoms? High blood pressure may not cause symptoms. Very high blood pressure (hypertensive crisis) may cause: Headache. Fast or irregular heartbeats (palpitations). Shortness of breath. Nosebleed. Nausea and vomiting. Vision changes. Severe chest pain, dizziness, and seizures. How is this diagnosed? This condition is diagnosed by  measuring your blood pressure while you are seated, with your arm resting on a flat surface, your legs uncrossed, and your feet flat on the floor. The cuff of the blood pressure monitor will be placed directly against the skin of your upper arm at the level of your heart. Blood pressure should be measured at least twice using the same arm. Certain conditions can cause a difference in blood pressure between your right and left arms. If you have a high blood pressure reading during one visit or you have normal blood pressure with other risk factors, you may be asked to: Return on a different day to have your blood pressure checked again. Monitor your blood pressure at home for 1 week or longer. If you are diagnosed with hypertension, you may have other blood or imaging tests to help your health care provider understand your overall risk for other conditions. How is this treated? This condition is treated by making healthy lifestyle changes, such as eating healthy foods, exercising more, and reducing your alcohol intake. You may be referred for counseling on a healthy diet and physical activity. Your health care provider may prescribe medicine if lifestyle changes are not enough to get your blood pressure under control and if: Your systolic blood pressure is above 130. Your diastolic blood pressure is above 80. Your personal target blood pressure may vary depending on your medical conditions, your age, and other factors. Follow these instructions at home: Eating and drinking  Eat a diet that is high in fiber and potassium, and low in sodium, added sugar, and fat. An example of this eating plan is called the DASH diet. DASH stands for Dietary Approaches to Stop Hypertension. To eat this way: Eat  plenty of fresh fruits and vegetables. Try to fill one half of your plate at each meal with fruits and vegetables. Eat whole grains, such as whole-wheat pasta, brown rice, or whole-grain bread. Fill about one  fourth of your plate with whole grains. Eat or drink low-fat dairy products, such as skim milk or low-fat yogurt. Avoid fatty cuts of meat, processed or cured meats, and poultry with skin. Fill about one fourth of your plate with lean proteins, such as fish, chicken without skin, beans, eggs, or tofu. Avoid pre-made and processed foods. These tend to be higher in sodium, added sugar, and fat. Reduce your daily sodium intake. Many people with hypertension should eat less than 1,500 mg of sodium a day. Do not drink alcohol if: Your health care provider tells you not to drink. You are pregnant, may be pregnant, or are planning to become pregnant. If you drink alcohol: Limit how much you have to: 0-1 drink a day for women. 0-2 drinks a day for men. Know how much alcohol is in your drink. In the [place] one drink equals one 12 oz bottle of beer (355 mL), one 5 oz glass of wine (148 mL), or one 1 oz glass of hard liquor (44 mL). Lifestyle  Work with your health care provider to maintain a healthy body weight or to lose weight. Ask what an ideal weight is for you. Get at least 30 minutes of exercise that causes your heart to beat faster (aerobic exercise) most days of the week. Activities may include walking, swimming, or biking. Include exercise to strengthen your muscles (resistance exercise), such as Pilates or lifting weights, as part of your weekly exercise routine. Try to do these types of exercises for 30 minutes at least 3 days a week. Do not use any products that contain nicotine or tobacco. These products include cigarettes, chewing tobacco, and vaping devices, such as e-cigarettes. If you need help quitting, ask your health care provider. Monitor your blood pressure at home as told by your health care provider. Keep all follow-up visits. This is important. Medicines Take over-the-counter and prescription medicines only as told by your health care provider. Follow directions carefully. Blood  pressure medicines must be taken as prescribed. Do not skip doses of blood pressure medicine. Doing this puts you at risk for problems and can make the medicine less effective. Ask your health care provider about side effects or reactions to medicines that you should watch for. Contact a health care provider if you: Think you are having a reaction to a medicine you are taking. Have headaches that keep coming back (recurring). Feel dizzy. Have swelling in your ankles. Have trouble with your vision. Get help right away if you: Develop a severe headache or confusion. Have unusual weakness or numbness. Feel faint. Have severe pain in your chest or abdomen. Vomit repeatedly. Have trouble breathing. These symptoms may be an emergency. Get help right away. Call 911. Do not wait to see if the symptoms will go away. Do not drive yourself to the hospital. Summary Hypertension is when the force of blood pumping through your arteries is too strong. If this condition is not controlled, it may put you at risk for serious complications. Your personal target blood pressure may vary depending on your medical conditions, your age, and other factors. For most people, a normal blood pressure is less than 120/80. Hypertension is treated with lifestyle changes, medicines, or a combination of both. Lifestyle changes include losing weight, eating a healthy,  low-sodium diet, exercising more, and limiting alcohol. This information is not intended to replace advice given to you by your health care provider. Make sure you discuss any questions you have with your health care provider. Document Revised: 01/09/2021 Document Reviewed: 01/09/2021 Elsevier Patient Education  2024 [employer].

## 2023-07-14 NOTE — Progress Notes (Signed)
 Subjective:    Patient ID: Vincent Allen, male    DOB: 1933/12/19, 89 y.o.   MRN: [id number]  Chief Complaint  Patient presents with   Medical Management of Chronic Issues    Patient presents today for a 3 month follow-up   Quality Metric Gaps    zoster    HPI Discussed the use of AI scribe software for clinical note transcription with the patient, who gave verbal consent to proceed.  History of Present Illness Vincent Allen "Vincent Allen" is an 89 year old male with diabetes who presents for a follow-up visit.  He has developed dry and thickened skin, leading to a crack in his heel after stubbing his toe, though his walking remains unaffected. His blood sugars have been stable since the onset of the skin issue. He consumes two or three types of juice daily, including prune juice, which aids in regular bowel movements. He admits to not drinking much water but is attempting to improve his intake.  He experiences numbness and burning in his hands, with the left index finger being partially numb at the Allen. There is also some numbness in his feet. He denies difficulty holding objects but finds the numbness bothersome. His sister notes that he has arthritis, particularly in the numb finger, and he has not had any recent trauma to the finger.  No recent ER visits or significant concerns. Blood pressure is stable, and there have been no recent infections. No burning during urination, and bowel movements are regular.    Past Medical History:  Diagnosis Date   Arthritis    Bradycardia    Cataract    Chronic kidney disease stage III (GFR 30-59 ml/min) 05/03/2012   Colon polyps    Complication of anesthesia    emesis   Diabetes mellitus    GERD (gastroesophageal reflux disease)    HOH (hard of hearing)    HTN (hypertension) 01/13/2013   Hyperlipidemia    Hypertension    Pacemaker 12/2016   Persistent atrial fibrillation (HCC)    Presence of permanent cardiac pacemaker 09/20/2016    Symptomatic bradycardia    Vitamin D  deficiency 07/17/2015    Past Surgical History:  Procedure Laterality Date   ANKLE ARTHROPLASTY     BIV PACEMAKER INSERTION CRT-P N/A 06/22/2021   Procedure: UPGRADE TO BIV PACEMAKER INSERTION CRT-P;  Surgeon: Verona Goodwill, MD;  Location: [facility] INVASIVE CV LAB;  Service: Cardiovascular;  Laterality: N/A;   CARDIOVERSION N/A 08/20/2016   Procedure: CARDIOVERSION;  Surgeon: Luana Rumple, MD;  Location: [facility]OS[facility];  Service: Cardiovascular;  Laterality: N/A;   CATARACT EXTRACTION  2010, 2014   CATARACT EXTRACTION  02/28/14   CHOLECYSTECTOMY     CHOLECYSTECTOMY N/A 05/03/2012   Procedure: LAPAROSCOPIC CHOLECYSTECTOMY WITH INTRAOPERATIVE CHOLANGIOGRAM;  Surgeon: Diantha Fossa, MD;  Location: [facility] OR;  Service: General;  Laterality: N/A;   INSERT / REPLACE / REMOVE PACEMAKER  09/20/2016   PACEMAKER IMPLANT N/A 09/20/2016   Procedure: Pacemaker Implant;  Surgeon: Verona Goodwill, MD;  Location: [facility] INVASIVE CV LAB;  Service: Cardiovascular;  Laterality: N/A;   ROTATOR CUFF REPAIR     SHOULDER SURGERY     TONSILLECTOMY      Family History  Problem Relation Age of Onset   Cancer Mother        colon, breast, pancreas, skin cancer   Heart disease Father        heart valve replaced   Stroke Father    Diabetes Father  Cancer Paternal Grandmother        colon   Obesity Son    Heart disease Son        bradycardia    Social History   Socioeconomic History   Marital status: Widowed    Spouse name: Not on file   Number of children: Not on file   Years of education: Not on file   Highest education level: Not on file  Occupational History   Not on file  Tobacco Use   Smoking status: Never   Smokeless tobacco: Never  Vaping Use   Vaping status: Never Used  Substance and Sexual Activity   Alcohol  use: Yes    Alcohol /week: 1.0 standard drink of alcohol     Types: 1 Glasses of wine per week   Drug use: No   Sexual activity: Not on file    Comment:  lives alone , no major dietary restrictions, retired as Production designer, theatre/television/film man for [employer].  Other Topics Concern   Not on file  Social History Narrative   Not on file   Social Drivers of Health   Financial Resource Strain: Low Risk  (04/24/2023)   Overall Financial Resource Strain (CARDIA)    Difficulty of Paying Living Expenses: Not hard at all  Food Insecurity: No Food Insecurity (04/24/2023)   Hunger Vital Sign    Worried About Running Out of Food in the Last Year: Never true    Ran Out of Food in the Last Year: Never true  Transportation Needs: No Transportation Needs (04/24/2023)   PRAPARE - Administrator, Civil Service (Medical): No    Lack of Transportation (Non-Medical): No  Physical Activity: Insufficiently Active (04/24/2023)   Exercise Vital Sign    Days of Exercise per Week: 5 days    Minutes of Exercise per Session: 20 min  Stress: No Stress Concern Present (04/24/2023)   [employer] of Occupational Health - Occupational Stress Questionnaire    Feeling of Stress : Not at all  Social Connections: Moderately Integrated (04/24/2023)   Social Connection and Isolation Panel [NHANES]    Frequency of Communication with Friends and Family: More than three times a week    Frequency of Social Gatherings with Friends and Family: More than three times a week    Attends Religious Services: More than 4 times per year    Active Member of [employer] or Organizations: Yes    Attends Banker Meetings: More than 4 times per year    Marital Status: Widowed  Intimate Partner Violence: Not At Risk (04/24/2023)   Humiliation, Afraid, Rape, and Kick questionnaire    Fear of Current or Ex-Partner: No    Emotionally Abused: No    Physically Abused: No    Sexually Abused: No    Outpatient Medications Prior to Visit  Medication Sig Dispense Refill   acetaminophen  (TYLENOL ) 500 MG tablet Take 500 mg by mouth every 6 (six) hours as needed for moderate pain.      apixaban  (ELIQUIS ) 2.5 MG TABS tablet TAKE 1 TABLET BY MOUTH TWICE A DAY 60 tablet 1   b complex vitamins capsule Take 1 capsule by mouth daily.     empagliflozin  (JARDIANCE ) 25 MG TABS tablet Take 1 tablet (25 mg total) by mouth daily. 30 tablet 3   ezetimibe  (ZETIA ) 10 MG tablet TAKE 1 TABLET BY MOUTH EVERY DAY 90 tablet 3   furosemide  (LASIX ) 40 MG tablet TAKE 1 TABLET BY MOUTH TWICE DAILY  FOR 5 DAYS THEN DAILY THEREAFTER 180 tablet 1   glipiZIDE  (GLUCOTROL ) 5 MG tablet 1 tab po BID 180 tablet 1   glucose blood (ONE TOUCH ULTRA TEST) test strip USE 1 STRIP 2 TIMES DAILY TO CHECK BLOOD SUGAR DX E08.22, N18.3 300 each 1   Krill Oil 1000 MG CAPS Take 1,000 mg by mouth daily.     losartan  (COZAAR ) 25 MG tablet TAKE 1 TABLET (25 MG TOTAL) BY MOUTH DAILY. 90 tablet 3   Multiple Vitamin (MULTIVITAMIN WITH MINERALS) TABS tablet Take 1 tablet by mouth daily.     potassium chloride  SA (KLOR-CON  M) 20 MEQ tablet Take 10 mEq by mouth daily.     sitaGLIPtin  (JANUVIA ) 25 MG tablet Take 1 tablet (25 mg total) by mouth daily. 30 tablet 3   Zinc 50 MG TABS Take 50 mg by mouth daily.     No facility-administered medications prior to visit.    Allergies  Allergen Reactions   Atorvastatin      Myalgias / leg pain and weakness   Penicillins Hives and Itching    Has patient had a PCN reaction causing immediate rash, facial/tongue/throat swelling, SOB or lightheadedness with hypotension:Yes Has patient had a PCN reaction causing severe rash involving mucus membranes or skin necrosis:No Has patient had a PCN reaction that required hospitalization:No Has patient had a PCN reaction occurring within the last 10 years:No If all of the above answers are "NO", then may proceed with Cephalosporin use.    Verapamil      Junctional rhythm   Influenza Vaccines Rash    tinnitus    Review of Systems  Constitutional:  Negative for fever and malaise/fatigue.  HENT:  Negative for congestion.   Eyes:  Negative for  blurred vision.  Respiratory:  Negative for shortness of breath.   Cardiovascular:  Positive for leg swelling. Negative for chest pain, palpitations and claudication.  Gastrointestinal:  Negative for abdominal pain, blood in stool and nausea.  Genitourinary:  Negative for dysuria and frequency.  Musculoskeletal:  Positive for joint pain. Negative for falls.  Skin:  Negative for rash.  Neurological:  Negative for dizziness, loss of consciousness and headaches.  Endo/Heme/Allergies:  Negative for environmental allergies.  Psychiatric/Behavioral:  Negative for depression. The patient is not nervous/anxious.        Objective:    Physical Exam Vitals reviewed.  Constitutional:      Appearance: Normal appearance. He is not ill-appearing.  HENT:     Head: Normocephalic and atraumatic.     Nose: Nose normal.  Eyes:     Conjunctiva/sclera: Conjunctivae normal.  Cardiovascular:     Rate and Rhythm: Normal rate.     Pulses: Normal pulses.     Heart sounds: Normal heart sounds. No murmur heard. Pulmonary:     Effort: Pulmonary effort is normal.     Breath sounds: Normal breath sounds. No wheezing.  Abdominal:     Palpations: Abdomen is soft. There is no mass.     Tenderness: There is no abdominal tenderness.  Musculoskeletal:     Cervical back: Normal range of motion.     Right lower leg: No edema.     Left lower leg: No edema.  Skin:    General: Skin is warm and dry.  Neurological:     General: No focal deficit present.     Mental Status: He is alert and oriented to person, place, and time.  Psychiatric:        Mood and Affect:  Mood normal.     BP 132/84   Pulse 78   Temp 98 F (36.7 C)   Resp 16   Ht 5\' 10"  (1.778 m)   Wt 188 lb 3.2 oz (85.4 kg)   SpO2 99%   BMI 27.00 kg/m  Wt Readings from Last 3 Encounters:  07/14/23 188 lb 3.2 oz (85.4 kg)  04/24/23 185 lb (83.9 kg)  04/03/23 195 lb 4 oz (88.6 kg)    Diabetic Foot Exam - Simple   No data filed    Lab  Results  Component Value Date   WBC 7.3 12/02/2022   HGB 14.6 12/02/2022   HCT 46.5 12/02/2022   PLT 153.0 12/02/2022   GLUCOSE 107 (H) 12/02/2022   CHOL 140 12/02/2022   TRIG 177.0 (H) 12/02/2022   HDL 42.20 12/02/2022   LDLDIRECT 65.0 11/03/2018   LDLCALC 63 12/02/2022   ALT 16 12/02/2022   AST 22 12/02/2022   NA 140 12/02/2022   K 3.4 (L) 12/02/2022   CL 104 12/02/2022   CREATININE 1.7 (A) 02/07/2023   BUN 29 (H) 12/02/2022   CO2 23 12/02/2022   TSH 3.14 12/02/2022   PSA 1.18 10/07/2012   INR 1.0 08/15/2016   HGBA1C 6.7 02/07/2023   MICROALBUR 8.6 (H) 01/15/2022    Lab Results  Component Value Date   TSH 3.14 12/02/2022   Lab Results  Component Value Date   WBC 7.3 12/02/2022   HGB 14.6 12/02/2022   HCT 46.5 12/02/2022   MCV 88.4 12/02/2022   PLT 153.0 12/02/2022   Lab Results  Component Value Date   NA 140 12/02/2022   K 3.4 (L) 12/02/2022   CO2 23 12/02/2022   GLUCOSE 107 (H) 12/02/2022   BUN 29 (H) 12/02/2022   CREATININE 1.7 (A) 02/07/2023   BILITOT 1.0 12/02/2022   ALKPHOS 59 12/02/2022   AST 22 12/02/2022   ALT 16 12/02/2022   PROT 6.1 12/02/2022   ALBUMIN  3.5 12/02/2022   CALCIUM  8.6 12/02/2022   ANIONGAP 10 05/03/2021   EGFR 35.0 06/07/2023   GFR 36.35 (L) 12/02/2022   Lab Results  Component Value Date   CHOL 140 12/02/2022   Lab Results  Component Value Date   HDL 42.20 12/02/2022   Lab Results  Component Value Date   LDLCALC 63 12/02/2022   Lab Results  Component Value Date   TRIG 177.0 (H) 12/02/2022   Lab Results  Component Value Date   CHOLHDL 3 12/02/2022   Lab Results  Component Value Date   HGBA1C 6.7 02/07/2023       Assessment & Plan:  Chronic diastolic CHF (congestive heart failure) (HCC) Assessment & Plan: No recent exacerbation, no changes   Diabetes mellitus due to underlying condition with stage 3 chronic kidney disease, unspecified whether long term insulin  use, unspecified whether stage 3a or 3b CKD  (HCC) Assessment & Plan: hgba1c acceptable, minimize simple carbs. Increase exercise as tolerated. Continue current meds   Orders: -     Hemoglobin A1c; Future -     Microalbumin / creatinine urine ratio; Future  Essential hypertension Assessment & Plan: Well controlled, no changes to meds. Encouraged heart healthy diet such as the DASH diet and exercise as tolerated.    Orders: -     Comprehensive metabolic panel with GFR; Future -     CBC with Differential/Platelet; Future -     TSH; Future  Hyperlipidemia LDL goal <70 Assessment & Plan: Encourage heart healthy diet such  as MIND or DASH diet, increase exercise, avoid trans fats, simple carbohydrates and processed foods, consider a krill or fish or flaxseed oil cap daily.   Orders: -     Lipid panel; Future  Insomnia, unspecified type Assessment & Plan: Encouraged good sleep hygiene such as dark, quiet room. No blue/green glowing lights such as computer screens in bedroom. No alcohol  or stimulants in evening. Cut down on caffeine as able. Regular exercise is helpful but not just prior to bed time.    Permanent atrial fibrillation [facility]) Assessment & Plan: Tolerating eliquis , rate controlled   Vitamin D  deficiency Assessment & Plan: Supplement and monitor   Orders: -     VITAMIN D  25 Hydroxy (Vit-D Deficiency, Fractures); Future    Assessment and Plan Assessment & Plan Diabetes mellitus due to underlying condition with stage 3 chronic kidney disease Diabetes and kidney markers stable. Coordination with [facility] for lab results needed. - Order fasting labs including hemoglobin A1c, cholesterol, and thyroid  function tests. - Coordinate with [facility] for recent lab results.  Numbness and burning in hands and feet Symptoms likely due to nerve compression from arthritis in the neck. Conservative management advised. - Recommend daily stretching exercises for neck and shoulders. - Apply heat followed by  stretching and Biofreeze to affected areas. - Consider referral to hand surgeon if symptoms worsen.  Essential hypertension Blood pressure well-controlled and improved since last visit.  Hyperlipidemia Plan to check cholesterol levels during upcoming fasting labs.  Wellness Visit Discussed hydration, nutrition, and minimizing caffeine intake. Reviewed and updated advanced care planning documents. - Encourage increased water intake. - Minimize caffeine intake. - Update chart with advanced care planning documents.     Randie Bustle, MD

## 2023-07-14 NOTE — Progress Notes (Unsigned)
 Chief Complaint  Patient presents with   skin fissure    Left heel skin fissure pt stated that the place is still there he stated that its not completely closed    HPI: 89 y.o. male presents today for follow-up of a wound/fissure to the left posterior heel at the posterior-plantar heel junction.  He is not having any pain to the area.  A family member is with him today.  They have been applying antibiotic ointment and keeping the area covered with a dressing.  Past Medical History:  Diagnosis Date   Arthritis    Bradycardia    Cataract    Chronic kidney disease stage III (GFR 30-59 ml/min) 05/03/2012   Colon polyps    Complication of anesthesia    emesis   Diabetes mellitus    GERD (gastroesophageal reflux disease)    HOH (hard of hearing)    HTN (hypertension) 01/13/2013   Hyperlipidemia    Hypertension    Pacemaker 12/2016   Persistent atrial fibrillation (HCC)    Presence of permanent cardiac pacemaker 09/20/2016   Symptomatic bradycardia    Vitamin D  deficiency 07/17/2015    Past Surgical History:  Procedure Laterality Date   ANKLE ARTHROPLASTY     BIV PACEMAKER INSERTION CRT-P N/A 06/22/2021   Procedure: UPGRADE TO BIV PACEMAKER INSERTION CRT-P;  Surgeon: Verona Goodwill, MD;  Location: [facility] INVASIVE CV LAB;  Service: Cardiovascular;  Laterality: N/A;   CARDIOVERSION N/A 08/20/2016   Procedure: CARDIOVERSION;  Surgeon: Luana Rumple, MD;  Location: [facility]OS[facility];  Service: Cardiovascular;  Laterality: N/A;   CATARACT EXTRACTION  2010, 2014   CATARACT EXTRACTION  02/28/14   CHOLECYSTECTOMY     CHOLECYSTECTOMY N/A 05/03/2012   Procedure: LAPAROSCOPIC CHOLECYSTECTOMY WITH INTRAOPERATIVE CHOLANGIOGRAM;  Surgeon: Diantha Fossa, MD;  Location: [facility] OR;  Service: General;  Laterality: N/A;   INSERT / REPLACE / REMOVE PACEMAKER  09/20/2016   PACEMAKER IMPLANT N/A 09/20/2016   Procedure: Pacemaker Implant;  Surgeon: Verona Goodwill, MD;  Location: [facility] INVASIVE CV LAB;  Service:  Cardiovascular;  Laterality: N/A;   ROTATOR CUFF REPAIR     SHOULDER SURGERY     TONSILLECTOMY      Allergies  Allergen Reactions   Atorvastatin      Myalgias / leg pain and weakness   Penicillins Hives and Itching    Has patient had a PCN reaction causing immediate rash, facial/tongue/throat swelling, SOB or lightheadedness with hypotension:Yes Has patient had a PCN reaction causing severe rash involving mucus membranes or skin necrosis:No Has patient had a PCN reaction that required hospitalization:No Has patient had a PCN reaction occurring within the last 10 years:No If all of the above answers are "NO", then may proceed with Cephalosporin use.    Verapamil      Junctional rhythm   Influenza Vaccines Rash    tinnitus    Physical Exam: The horizontal fissure/diabetic wound on the posterior-plantar left heel does appear more superficial today.  The length of the fissure has not improved.  There is mild hyperkeratosis along the periphery.  No clinical signs of infection are noted.  No active drainage is seen.  There is no erythema, rubor or calor noted.  No pain on palpation is noted  Assessment/Plan of Care: 1. Open wound of left heel, subsequent encounter   2. Fissure in skin of left foot   3. Type 2 diabetes mellitus with foot ulcer (CODE) (HCC)     Discussed clinical findings with patient  today.  Due to the concern of this patient being diabetic and not able to 10 to the wound himself due to his age and inability to see and reach the back of his heel, I felt it best to recommend adhesive closure with Dermabond agent.  The family was in agreement.  Following shaving of the hyperkeratosis surrounding the wound/fissure, the Dermabond agent was utilized across the fissure while holding the edges in close approximation.  This was maintained for at least 30 seconds post application until the wound held closed on its own with the Dermabond agent.  A light, dry dressing was applied for  support.  They can remove this tonight.  They were advised to keep an eye on the area for any signs of failure of the Dermabond agent and reopening of the fissure.  They were more comfortable with following up in 3 to 5 weeks for a possible final recheck on this wound.  Joe Murders, DPM, FACFAS [facility]     [address] [place], [place] [address]                Office [phone]  Fax [phone]

## 2023-07-19 ENCOUNTER — Other Ambulatory Visit: Payer: Self-pay | Admitting: Family Medicine

## 2023-07-23 ENCOUNTER — Encounter: Payer: Self-pay | Admitting: Family Medicine

## 2023-07-23 ENCOUNTER — Telehealth: Payer: Self-pay

## 2023-07-23 ENCOUNTER — Other Ambulatory Visit (INDEPENDENT_AMBULATORY_CARE_PROVIDER_SITE_OTHER)

## 2023-07-23 DIAGNOSIS — E0822 Diabetes mellitus due to underlying condition with diabetic chronic kidney disease: Secondary | ICD-10-CM

## 2023-07-23 DIAGNOSIS — N183 Chronic kidney disease, stage 3 unspecified: Secondary | ICD-10-CM

## 2023-07-23 DIAGNOSIS — E559 Vitamin D deficiency, unspecified: Secondary | ICD-10-CM

## 2023-07-23 DIAGNOSIS — E673 Hypervitaminosis D: Secondary | ICD-10-CM

## 2023-07-23 DIAGNOSIS — E785 Hyperlipidemia, unspecified: Secondary | ICD-10-CM

## 2023-07-23 DIAGNOSIS — I10 Essential (primary) hypertension: Secondary | ICD-10-CM | POA: Diagnosis not present

## 2023-07-23 LAB — CBC WITH DIFFERENTIAL/PLATELET
Basophils Absolute: 0 10*3/uL (ref 0.0–0.1)
Basophils Relative: 0.5 % (ref 0.0–3.0)
Eosinophils Absolute: 0.2 10*3/uL (ref 0.0–0.7)
Eosinophils Relative: 2.1 % (ref 0.0–5.0)
HCT: 48.2 % (ref 39.0–52.0)
Hemoglobin: 15.5 g/dL (ref 13.0–17.0)
Lymphocytes Relative: 22.5 % (ref 12.0–46.0)
Lymphs Abs: 1.6 10*3/uL (ref 0.7–4.0)
MCHC: 32.1 g/dL (ref 30.0–36.0)
MCV: 89.9 fl (ref 78.0–100.0)
Monocytes Absolute: 0.7 10*3/uL (ref 0.1–1.0)
Monocytes Relative: 9.6 % (ref 3.0–12.0)
Neutro Abs: 4.7 10*3/uL (ref 1.4–7.7)
Neutrophils Relative %: 65.3 % (ref 43.0–77.0)
Platelets: 154 10*3/uL (ref 150.0–400.0)
RBC: 5.36 Mil/uL (ref 4.22–5.81)
RDW: 14 % (ref 11.5–15.5)
WBC: 7.2 10*3/uL (ref 4.0–10.5)

## 2023-07-23 LAB — LIPID PANEL
Cholesterol: 144 mg/dL (ref 0–200)
HDL: 43.8 mg/dL (ref >39.00)
LDL Cholesterol: 75 mg/dL (ref 0–99)
NonHDL: 100.28
Total CHOL/HDL Ratio: 3
Triglycerides: 128 mg/dL (ref 0.0–149.0)
VLDL: 25.6 mg/dL (ref 0.0–40.0)

## 2023-07-23 LAB — MICROALBUMIN / CREATININE URINE RATIO
Creatinine,U: 104.4 mg/dL
Microalb Creat Ratio: 198.6 mg/g — ABNORMAL HIGH (ref 0.0–30.0)
Microalb, Ur: 20.7 mg/dL — ABNORMAL HIGH (ref 0.0–1.9)

## 2023-07-23 LAB — COMPREHENSIVE METABOLIC PANEL WITH GFR
ALT: 18 U/L (ref 0–53)
AST: 23 U/L (ref 0–37)
Albumin: 3.9 g/dL (ref 3.5–5.2)
Alkaline Phosphatase: 51 U/L (ref 39–117)
BUN: 30 mg/dL — ABNORMAL HIGH (ref 6–23)
CO2: 26 meq/L (ref 19–32)
Calcium: 9.3 mg/dL (ref 8.4–10.5)
Chloride: 104 meq/L (ref 96–112)
Creatinine, Ser: 1.59 mg/dL — ABNORMAL HIGH (ref 0.40–1.50)
GFR: 38.66 mL/min — ABNORMAL LOW (ref >60.00)
Glucose, Bld: 95 mg/dL (ref 70–99)
Potassium: 3.9 meq/L (ref 3.5–5.1)
Sodium: 141 meq/L (ref 135–145)
Total Bilirubin: 1 mg/dL (ref 0.2–1.2)
Total Protein: 6.9 g/dL (ref 6.0–8.3)

## 2023-07-23 LAB — HEMOGLOBIN A1C: Hgb A1c MFr Bld: 6.7 % — ABNORMAL HIGH (ref 4.6–6.5)

## 2023-07-23 LAB — TSH: TSH: 3.39 u[IU]/mL (ref 0.35–5.50)

## 2023-07-23 LAB — VITAMIN D 25 HYDROXY (VIT D DEFICIENCY, FRACTURES): VITD: 119.84 ng/mL (ref 30.00–100.00)

## 2023-07-23 NOTE — Telephone Encounter (Signed)
 CRITICAL VALUE STICKER  CRITICAL VALUE: Vitamin D - 119.84  MESSENGER (representative from lab): [id number]

## 2023-07-24 NOTE — Addendum Note (Signed)
 Addended by: Adaline Trejos C on: 07/24/2023 09:07 AM   Modules accepted: Orders

## 2023-07-24 NOTE — Telephone Encounter (Signed)
 Patient was advised via result message.

## 2023-08-08 NOTE — Progress Notes (Signed)
 Remote pacemaker transmission.

## 2023-08-19 ENCOUNTER — Ambulatory Visit: Admitting: Podiatry

## 2023-08-19 ENCOUNTER — Encounter: Payer: Self-pay | Admitting: Podiatry

## 2023-08-19 DIAGNOSIS — R234 Changes in skin texture: Secondary | ICD-10-CM

## 2023-08-19 NOTE — Progress Notes (Unsigned)
 Chief Complaint  Patient presents with   Wound Check    Rm 19 follow up wound check/ Patient says he is doing well   HPI: 89 y.o. male presents today for recheck of left posterior heel fissure.  They have not been treating the wound at this point.  Family member is with him today.  Patient denies any pain at this time.  They deny any drainage.  Past Medical History:  Diagnosis Date   Arthritis    Bradycardia    Cataract    Chronic kidney disease stage III (GFR 30-59 ml/min) 05/03/2012   Colon polyps    Complication of anesthesia    emesis   Diabetes mellitus    GERD (gastroesophageal reflux disease)    HOH (hard of hearing)    HTN (hypertension) 01/13/2013   Hyperlipidemia    Hypertension    Pacemaker 12/2016   Persistent atrial fibrillation (HCC)    Presence of permanent cardiac pacemaker 09/20/2016   Symptomatic bradycardia    Vitamin D  deficiency 07/17/2015   Past Surgical History:  Procedure Laterality Date   ANKLE ARTHROPLASTY     BIV PACEMAKER INSERTION CRT-P N/A 06/22/2021   Procedure: UPGRADE TO BIV PACEMAKER INSERTION CRT-P;  Surgeon: Verona Goodwill, MD;  Location: [facility] INVASIVE CV LAB;  Service: Cardiovascular;  Laterality: N/A;   CARDIOVERSION N/A 08/20/2016   Procedure: CARDIOVERSION;  Surgeon: Luana Rumple, MD;  [facility]: [facility];  Service: Cardiovascular;  Laterality: N/A;   CATARACT EXTRACTION  2010, 2014   CATARACT EXTRACTION  02/28/14   CHOLECYSTECTOMY     CHOLECYSTECTOMY N/A 05/03/2012   Procedure: LAPAROSCOPIC CHOLECYSTECTOMY WITH INTRAOPERATIVE CHOLANGIOGRAM;  Surgeon: Diantha Fossa, MD;  Location: [facility] OR;  Service: General;  Laterality: N/A;   INSERT / REPLACE / REMOVE PACEMAKER  09/20/2016   PACEMAKER IMPLANT N/A 09/20/2016   Procedure: Pacemaker Implant;  Surgeon: Verona Goodwill, MD;  Location: [facility] INVASIVE CV LAB;  Service: Cardiovascular;  Laterality: N/A;   ROTATOR CUFF REPAIR     SHOULDER SURGERY     TONSILLECTOMY     Allergies  Allergen  Reactions   Atorvastatin      Myalgias / leg pain and weakness   Penicillins Hives and Itching    Has patient had a PCN reaction causing immediate rash, facial/tongue/throat swelling, SOB or lightheadedness with hypotension:Yes Has patient had a PCN reaction causing severe rash involving mucus membranes or skin necrosis:No Has patient had a PCN reaction that required hospitalization:No Has patient had a PCN reaction occurring within the last 10 years:No If all of the above answers are "NO", then may proceed with Cephalosporin use.    Verapamil      Junctional rhythm   Influenza Vaccines Rash    tinnitus      Physical Exam: Trace palpable pedal pulses.  The fissure on the posterior aspect of the left heel is closed at this time.  There is minimal hyperkeratotic tissue over the edges of the location of the fissure.  This was shaved uneventfully and there was no underlying ulceration.  No erythema or clinical signs of infection are noted.  Assessment/Plan of Care: 1. Fissure in skin of left foot     The hyperkeratotic tissue around the fissure was shaved with a sterile #[id number] blade.  Antibiotic ointment was applied today.  Encouraged him to apply antibiotic ointment (i.e.: Neosporin ointment) approximately twice per week to the heel area to keep the skin soft and supple.  Follow-up as needed  Lenay Lovejoy D.  Savaya Hakes, DPM, FACFAS [facility]     [address] [place], [place] [address]                Office [phone]  Fax [phone]

## 2023-08-30 ENCOUNTER — Other Ambulatory Visit: Payer: Self-pay | Admitting: Family Medicine

## 2023-08-30 DIAGNOSIS — I4819 Other persistent atrial fibrillation: Secondary | ICD-10-CM

## 2023-09-01 DIAGNOSIS — H903 Sensorineural hearing loss, bilateral: Secondary | ICD-10-CM | POA: Diagnosis not present

## 2023-09-10 ENCOUNTER — Encounter (HOSPITAL_BASED_OUTPATIENT_CLINIC_OR_DEPARTMENT_OTHER): Payer: Self-pay | Admitting: Internal Medicine

## 2023-09-10 ENCOUNTER — Ambulatory Visit (HOSPITAL_BASED_OUTPATIENT_CLINIC_OR_DEPARTMENT_OTHER): Payer: PPO | Admitting: Internal Medicine

## 2023-09-10 VITALS — BP 104/60 | HR 90 | Ht 72.0 in | Wt 184.8 lb

## 2023-09-10 DIAGNOSIS — I5042 Chronic combined systolic (congestive) and diastolic (congestive) heart failure: Secondary | ICD-10-CM

## 2023-09-10 DIAGNOSIS — I447 Left bundle-branch block, unspecified: Secondary | ICD-10-CM | POA: Diagnosis not present

## 2023-09-10 DIAGNOSIS — Z950 Presence of cardiac pacemaker: Secondary | ICD-10-CM

## 2023-09-10 DIAGNOSIS — I4821 Permanent atrial fibrillation: Secondary | ICD-10-CM

## 2023-09-10 NOTE — Progress Notes (Signed)
 OFFICE FOLLOW-UP NOTE  Chief Complaint:  Follow-up CHF  Primary Care Physician: Domenica Harlene LABOR, MD  HPI:  Vincent Allen is a 89 y.o. male with a past medial history significant for atrial fibrillation/flutter with slow ventricular response. Seen recently in the hospital after mechanical fall in the home. He has a history of paroxysmal atrial fibrillation in the past and was seen by Dr. Blanca that was not on any anticoagulation. His CHADSVASC score is 4. He was started on Eliquis  2.5 mg twice a day due to age greater than 80 creatinine greater than 1.5. An echocardiogram was performed which showed normal systolic function, diastolic dysfunction moderate left atrial enlargement. I arranged for outpatient monitoring which shows persistent atrial fibrillation/flutter with controlled ventricular response and at times bradycardia but no clear long pauses or any other indications for pacemaker at this time. He reports being fatigued. He's been an active problem for more than 60 years and is interested in getting back to flying feels that his lack of energy is keeping him back from that.  09/12/2016  Vincent Allen returns for follow-up today. He underwent recent a cardioversion by Dr. Francyne. This was successful and he reported feeling a little better with heart rates in sinus in the 50s for about a week, however then started to feel more fatigued. He's not been on any AV nodal blocking medications. He reports compliance with Eliquis . EKG today shows atrial fibrillation with a marked bradycardic response of 40. Notes that his heart rate will not necessarily increase with any exertion.  10/24/2016  Vincent Allen returns today for follow-up. He underwent successful pacemaker placement by Dr. Fernande about 3 weeks ago. This was a Medtronic dual-chamber pacemaker. Subsequent pacemaker follow-up shows 100% atrial fibrillation. Pacemaker appears to be working properly. The wound appears to be well-healed. He is restricted  his arm movement now and is interested in more activity. He recently had lab work in advance to upcoming visit with Dr. Carleton. Total cholesterol is 110, Travis was 167, HDL 37 and LDL 39. Metabolic profile was unremarkable with potassium however was slightly low at 3.4 and creatinine 1.45. Blood pressure is elevated today. I reviewed home readings indicating his blood pressure ranges from the about the 140s to 160s systolic over 80s diastolic. Heart rate is been mostly in the 70s and 80s. There is no evidence of rapid atrial fibrillation or bradycardia. He reports improvement in his energy level.  04/28/2017  Vincent Allen returns today for follow-up.  He has had significant improvement in his symptoms after placement of pacemaker.  He still gets a little short of breath but is not been quite as active.  Weight is somewhat better now to 19 and was as high as 225 in November.  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  Blood pressure is well controlled today.  Has had follow-up with Dr. Fernande who feels the pacemaker is working appropriately.  05/11/2018  Vincent Allen is seen today in annual follow-up.  Overall he is doing well.  He is not been working on his airplane is much recently.  He says that he is worried about falls.  He did sell off a lot of his projects as well as his angina recently.  He denies any chest pain or shortness of breath.  He said no syncopal episodes.  Recent pacemaker interrogation shows a normal pacing and he is ventricular paced today.  He has persistent if not permanent A. fib.  He is  not have any bleeding problems on Eliquis .  Blood pressures been well controlled.  Lab work recently and April 23, 2018 showed total cholesterol 171, HDL 34, triglycerides of 182 and LDL of 100.  His goal LDL should be slightly lower than that I would suspect if we want to be aggressive probably less than 100.  He is not listed to be on statin therapy but previously was on atorvastatin   20 mg.  He is not sure if he is taking it but again I had him listed on that a year ago.  I would advise if he is come off the medication to restart that otherwise we may need to consider increasing it further.  05/09/2021  Vincent Allen is seen today in follow-up.  He was recently hospitalized and I cared for him for about a week of that hospitalization that actually went more than 2 weeks.  He had symptoms of right heart failure but also had some LV dysfunction with EF around 45%.  He underwent paracentesis with about 5 L of fluid removed.  This was transudative and look more like heart failure.  Subsequently was placed on milrinone  and had good color oximetry panels.  He diuresed from 244 pounds down to 201 pounds today which is his new her dry weight.  On discharge his Lasix  was increased to 40 mg twice daily.  He is currently taking that in the morning.  He continues to have issues with nocturia and has had BPH for years but is not on treatment for that.  I suspect this is the reason for his nocturnal overflow incontinence.  Blood pressure is good today.  He says he feels well and is ambulating without difficulty.  Oxygen saturation has improved.  He will need repeat labs including metabolic profile and magnesium  because of issues with hypokalemia during the hospitalization.  He has a transition of care follow-up with heart failure on February 24.  It was also noted that he had 100% ventricular pacing on recent remote checks.  This could be an etiology of his reduced LV function.  He was seen by Dr. Francyne, who recommended evaluation for CRT-P therapy.  I would then refer Vincent Allen to our electrophysiologist.  10/25/2021  Vincent Allen returns today for follow-up.  He had been referred to Dr. Fernande for evaluation of possible biventricular pacemaker upgrade.  He did qualify for that and had placement of that earlier this year.  Since then he has had significant improvement in his heart failure symptoms.  Now NYHA class  II.  He has been seen by Dr. Fernande recently and his primary care provider who increased his diuretic.  He 1 point he was up to 80 mg of Lasix  but noted being dizzy on this dose.  He is currently on Lasix  40 mg daily.  He has appeared euvolemic.  Blood pressure is elevated somewhat today.  He is only on Jardiance  for heart failure medications.  He does have stage III-IV chronic kidney disease.  GFR around 32.  Recent lipids in July showed total cholesterol 161, HDL 41, triglycerides 90 and LDL 102.  03/08/2022  Vincent Allen is seen today for follow-up.  He is doing well and is accompanied by his son.  He is having no chest pain or worsening shortness of breath.  He does have fairly poor hearing.  His remote pacer checks have been stable.  Lipids have been reasonably well-controlled with LDL of 83.  He is due for refill of his Zetia .  He  denies any worsening heart failure symptoms.  EKG shows a ventricular paced rhythm.  Even though he had clinical response to BiV pacing, his LVEF had not really improved.  09/03/2022  Vincent Allen returns today for follow-up heart failure.  Overall he seems to be doing well.  His weight has been stable and has had no worsening edema.  He denies worsening shortness of breath, orthopnea or any heart failure symptoms.  He has had remote pacer checks which are stable.  He is scheduled to have another back injection in the near future and requires clearance for that.  He is already advised to hold his Eliquis  3 days prior to the procedure and start at least 1 to 2 days afterwards.  I do not see any reason why he cannot proceed with injection at this time.  03/04/2023  Vincent Allen is seen today in follow-up.  Overall he says he is feeling well.  He denies any worsening edema.  He has had no worsening shortness of breath.  There has been a slight weight gain up about 3 pounds.  Initially his weight was recorded at 199 pounds which was very unusual and about 10 pounds over his dry weight.   He says his home readings are about 190-191 which is what we were able to remeasure him at.  He denies any bleeding issues with the Eliquis .  He does state he does some walking on the treadmill as well.  Recent device remote check showed 99.5% biventricular pacing and no evidence of mode switching or change in thoracic impedance.  09/10/2023  Vincent Allen seen today for follow-up.  He continues to do fairly well.  He is now 77 and denies any worsening heart failure.  His weight is actually a few pounds lower than it had been previously.  Lipids are well-controlled with total cholesterol 144, triglycerides 120, HDL 43 and LDL 75.  He denies any significant worsening lower extremity edema.  He continues to have remote pacer checks and has another one scheduled for July 10.  He was previously followed by Dr. Fernande but will need to establish with a new electrophysiologist.   PMHx:  Past Medical History:  Diagnosis Date   Arthritis    Bradycardia    Cataract    Chronic kidney disease stage III (GFR 30-59 ml/min) 05/03/2012   Colon polyps    Complication of anesthesia    emesis   Diabetes mellitus    GERD (gastroesophageal reflux disease)    HOH (hard of hearing)    HTN (hypertension) 01/13/2013   Hyperlipidemia    Hypertension    Pacemaker 12/2016   Persistent atrial fibrillation (HCC)    Presence of permanent cardiac pacemaker 09/20/2016   Symptomatic bradycardia    Vitamin D  deficiency 07/17/2015    Past Surgical History:  Procedure Laterality Date   ANKLE ARTHROPLASTY     BIV PACEMAKER INSERTION CRT-P N/A 06/22/2021   Procedure: UPGRADE TO BIV PACEMAKER INSERTION CRT-P;  Surgeon: Fernande Elspeth BROCKS, MD;  Location: [facility] INVASIVE CV LAB;  Service: Cardiovascular;  Laterality: N/A;   CARDIOVERSION N/A 08/20/2016   Procedure: CARDIOVERSION;  Surgeon: Francyne Headland, MD;  Location: [facility] ENDOSCOPY;  Service: Cardiovascular;  Laterality: N/A;   CATARACT EXTRACTION  2010, 2014   CATARACT EXTRACTION   02/28/14   CHOLECYSTECTOMY     CHOLECYSTECTOMY N/A 05/03/2012   Procedure: LAPAROSCOPIC CHOLECYSTECTOMY WITH INTRAOPERATIVE CHOLANGIOGRAM;  Surgeon: Lynwood MALVA Pina, MD;  Location: [facility] OR;  Service: General;  Laterality: N/A;  INSERT / REPLACE / REMOVE PACEMAKER  09/20/2016   PACEMAKER IMPLANT N/A 09/20/2016   Procedure: Pacemaker Implant;  Surgeon: Fernande Elspeth BROCKS, MD;  Location: [facility] INVASIVE CV LAB;  Service: Cardiovascular;  Laterality: N/A;   ROTATOR CUFF REPAIR     SHOULDER SURGERY     TONSILLECTOMY      FAMHx:  Family History  Problem Relation Age of Onset   Cancer Mother        colon, breast, pancreas, skin cancer   Heart disease Father        heart valve replaced   Stroke Father    Diabetes Father    Cancer Paternal Grandmother        colon   Obesity Son    Heart disease Son        bradycardia    SOCHx:   reports that he has never smoked. He has never used smokeless tobacco. He reports current alcohol  use of about 1.0 standard drink of alcohol  per week. He reports that he does not use drugs.  ALLERGIES:  Allergies  Allergen Reactions   Atorvastatin      Myalgias / leg pain and weakness   Penicillins Hives and Itching    Has patient had a PCN reaction causing immediate rash, facial/tongue/throat swelling, SOB or lightheadedness with hypotension:Yes Has patient had a PCN reaction causing severe rash involving mucus membranes or skin necrosis:No Has patient had a PCN reaction that required hospitalization:No Has patient had a PCN reaction occurring within the last 10 years:No If all of the above answers are NO, then may proceed with Cephalosporin use.    Verapamil      Junctional rhythm   Influenza Vaccines Rash    tinnitus    ROS: Pertinent items noted in HPI and remainder of comprehensive ROS otherwise negative.  HOME MEDS: Current Outpatient Medications on File Prior to Visit  Medication Sig Dispense Refill   apixaban  (ELIQUIS ) 2.5 MG TABS tablet Take 1 tablet  (2.5 mg total) by mouth 2 (two) times daily. 180 tablet 0   empagliflozin  (JARDIANCE ) 25 MG TABS tablet Take 1 tablet (25 mg total) by mouth daily. 30 tablet 3   ezetimibe  (ZETIA ) 10 MG tablet TAKE 1 TABLET BY MOUTH EVERY DAY 90 tablet 3   furosemide  (LASIX ) 40 MG tablet TAKE 1 TABLET BY MOUTH TWICE DAILY FOR 5 DAYS THEN DAILY THEREAFTER 180 tablet 1   glipiZIDE  (GLUCOTROL ) 5 MG tablet 1 tab po BID 180 tablet 1   glucose blood (ONE TOUCH ULTRA TEST) test strip USE 1 STRIP 2 TIMES DAILY TO CHECK BLOOD SUGAR DX E08.22, N18.3 300 each 1   Krill Oil 1000 MG CAPS Take 1,000 mg by mouth daily.     losartan  (COZAAR ) 25 MG tablet TAKE 1 TABLET (25 MG TOTAL) BY MOUTH DAILY. 90 tablet 3   potassium chloride  SA (KLOR-CON  M) 20 MEQ tablet Take 10 mEq by mouth daily.     sitaGLIPtin  (JANUVIA ) 25 MG tablet Take 1 tablet (25 mg total) by mouth daily. 30 tablet 3   acetaminophen  (TYLENOL ) 500 MG tablet Take 500 mg by mouth every 6 (six) hours as needed for moderate pain. (Patient not taking: Reported on 09/10/2023)     b complex vitamins capsule Take 1 capsule by mouth daily. (Patient not taking: Reported on 09/10/2023)     Multiple Vitamin (MULTIVITAMIN WITH MINERALS) TABS tablet Take 1 tablet by mouth daily. (Patient not taking: Reported on 09/10/2023)     Zinc 50 MG TABS Take  50 mg by mouth daily. (Patient not taking: Reported on 09/10/2023)     No current facility-administered medications on file prior to visit.    LABS/IMAGING: No results found for this or any previous visit (from the past 48 hours). No results found.  LIPID PANEL:    Component Value Date/Time   CHOL 144 07/23/2023 0838   TRIG 128.0 07/23/2023 0838   HDL 43.80 07/23/2023 0838   CHOLHDL 3 07/23/2023 0838   VLDL 25.6 07/23/2023 0838   LDLCALC 75 07/23/2023 0838   LDLCALC 101 (H) 12/16/2019 1543   LDLDIRECT 65.0 11/03/2018 1038     WEIGHTS: Wt Readings from Last 3 Encounters:  09/10/23 184 lb 12.8 oz (83.8 kg)  07/14/23 188 lb  3.2 oz (85.4 kg)  04/24/23 185 lb (83.9 kg)    VITALS: BP 104/60 (BP Location: Left Arm, Patient Position: Sitting)   Pulse 90   Ht 6' (1.829 m)   Wt 184 lb 12.8 oz (83.8 kg)   SpO2 98%   BMI 25.06 kg/m   EXAM: General appearance: alert, no distress, and pale Neck: no carotid bruit, no JVD, and thyroid  not enlarged, symmetric, no tenderness/mass/nodules Lungs: clear to auscultation bilaterally Heart: regular rate and rhythm, S1, S2 normal, no murmur, click, rub or gallop and Healing pacemaker site left upper chest Abdomen: soft, non-tender; bowel sounds normal; no masses,  no organomegaly and mildly protuberant Extremities: edema 1+ bilateral LE Pulses: 2+ and symmetric Skin: Skin color, texture, turgor normal. 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   Mr. Weideman has continued to do well after pacemaker placement and CRT-P upgrade.  He denies any worsening heart failure symptoms.  He is anticoagulated on Eliquis  without bleeding issues.  Weight is lower than it has been previously without any worsening edema.  His last known LVEF was 40 to 45% in 2023.  Will plan to continue our current therapies.  He has a remote pacer check coming up and will establish with a new electrophysiologist for in person checks.  His most recent battery status life was noted to be good.  Follow-up with me annually or sooner as necessary.  Vinie KYM Maxcy, MD, [facility], FNLA, FACP  [place]  [facility]  Medical Director of  the Advanced Lipid Disorders &  Cardiovascular Risk Reduction Clinic Diplomate of the American Board of Clinical Lipidology Attending Cardiologist  Direct Dial: [phone]  Fax: [phone]  Website:  [email or web address]   Vinie BROCKS Taahir Grisby 09/10/2023, 1:12 PM

## 2023-09-10 NOTE — Patient Instructions (Signed)
 Medication Instructions:  Your physician recommends that you continue on your current medications as directed. Please refer to the Current Medication list given to you today.  *If you need a refill on your cardiac medications before your next appointment, please call your pharmacy*  Follow-Up: At [facility], you and your health needs are our priority.  As part of our continuing mission to provide you with exceptional heart care, our providers are all part of one team.  This team includes your primary Cardiologist (physician) and Advanced Practice Providers or APPs (Physician Assistants and Nurse Practitioners) who all work together to provide you with the care you need, when you need it.  Your next appointment:   1 year with Dr. Maximo Spar

## 2023-09-20 ENCOUNTER — Other Ambulatory Visit: Payer: Self-pay | Admitting: Family Medicine

## 2023-09-20 ENCOUNTER — Other Ambulatory Visit: Payer: Self-pay | Admitting: Internal Medicine

## 2023-09-25 ENCOUNTER — Ambulatory Visit: Payer: PPO

## 2023-09-25 DIAGNOSIS — I447 Left bundle-branch block, unspecified: Secondary | ICD-10-CM

## 2023-09-26 LAB — CUP PACEART REMOTE DEVICE CHECK
Battery Remaining Longevity: 94 mo
Battery Voltage: 2.98 V
Brady Statistic RA Percent Paced: 0 %
Brady Statistic RV Percent Paced: 97.98 %
Date Time Interrogation Session: 20250709223734
Implantable Lead Connection Status: 753985
Implantable Lead Connection Status: 753985
Implantable Lead Connection Status: 753985
Implantable Lead Implant Date: 20180706
Implantable Lead Implant Date: 20180706
Implantable Lead Implant Date: 20230407
Implantable Lead Location: 753858
Implantable Lead Location: 753859
Implantable Lead Location: 753860
Implantable Lead Model: 5076
Implantable Lead Model: 5076
Implantable Pulse Generator Implant Date: 20230407
Lead Channel Impedance Value: 1102 Ohm
Lead Channel Impedance Value: 1178 Ohm
Lead Channel Impedance Value: 1216 Ohm
Lead Channel Impedance Value: 1235 Ohm
Lead Channel Impedance Value: 1311 Ohm
Lead Channel Impedance Value: 1330 Ohm
Lead Channel Impedance Value: 304 Ohm
Lead Channel Impedance Value: 380 Ohm
Lead Channel Impedance Value: 380 Ohm
Lead Channel Impedance Value: 456 Ohm
Lead Channel Impedance Value: 570 Ohm
Lead Channel Impedance Value: 646 Ohm
Lead Channel Impedance Value: 722 Ohm
Lead Channel Impedance Value: 760 Ohm
Lead Channel Pacing Threshold Amplitude: 0.875 V
Lead Channel Pacing Threshold Amplitude: 1.375 V
Lead Channel Pacing Threshold Pulse Width: 0.4 ms
Lead Channel Pacing Threshold Pulse Width: 0.8 ms
Lead Channel Sensing Intrinsic Amplitude: 1 mV
Lead Channel Sensing Intrinsic Amplitude: 1 mV
Lead Channel Sensing Intrinsic Amplitude: 7 mV
Lead Channel Sensing Intrinsic Amplitude: 7 mV
Lead Channel Setting Pacing Amplitude: 2 V
Lead Channel Setting Pacing Amplitude: 2 V
Lead Channel Setting Pacing Pulse Width: 0.4 ms
Lead Channel Setting Pacing Pulse Width: 0.8 ms
Lead Channel Setting Sensing Sensitivity: 1.2 mV
Zone Setting Status: 755011
Zone Setting Status: 755011

## 2023-10-01 ENCOUNTER — Ambulatory Visit: Payer: Self-pay | Admitting: Cardiology

## 2023-10-01 DIAGNOSIS — H903 Sensorineural hearing loss, bilateral: Secondary | ICD-10-CM | POA: Diagnosis not present

## 2023-10-07 ENCOUNTER — Ambulatory Visit: Admitting: Podiatry

## 2023-10-07 ENCOUNTER — Encounter: Payer: Self-pay | Admitting: Podiatry

## 2023-10-07 DIAGNOSIS — M79675 Pain in left toe(s): Secondary | ICD-10-CM

## 2023-10-07 DIAGNOSIS — M79674 Pain in right toe(s): Secondary | ICD-10-CM

## 2023-10-07 DIAGNOSIS — E0822 Diabetes mellitus due to underlying condition with diabetic chronic kidney disease: Secondary | ICD-10-CM

## 2023-10-07 DIAGNOSIS — N183 Chronic kidney disease, stage 3 unspecified: Secondary | ICD-10-CM

## 2023-10-07 DIAGNOSIS — B351 Tinea unguium: Secondary | ICD-10-CM | POA: Diagnosis not present

## 2023-10-12 ENCOUNTER — Other Ambulatory Visit: Payer: Self-pay | Admitting: Family Medicine

## 2023-10-12 NOTE — Progress Notes (Signed)
 Subjective:  Patient ID: Vincent Allen, male    DOB: 1933-04-21,  MRN: [id number]  Vincent Allen presents to clinic today for at risk foot care. Pt has h/o NIDDM with chronic kidney disease and painful thick toenails that are difficult to trim. Pain interferes with ambulation. Aggravating factors include wearing enclosed shoe gear. Pain is relieved with periodic professional debridement. He is accompanied by his son on today's visit. Chief Complaint  Patient presents with   Diabetes    [facility] NIDDM A1C 6.7 toenail trim. LOV with PCP 07/14/23.   New problem(s): None.   PCP is Domenica Harlene LABOR, MD.  Allergies  Allergen Reactions   Atorvastatin      Myalgias / leg pain and weakness   Penicillins Hives and Itching    Has patient had a PCN reaction causing immediate rash, facial/tongue/throat swelling, SOB or lightheadedness with hypotension:Yes Has patient had a PCN reaction causing severe rash involving mucus membranes or skin necrosis:No Has patient had a PCN reaction that required hospitalization:No Has patient had a PCN reaction occurring within the last 10 years:No If all of the above answers are NO, then may proceed with Cephalosporin use.    Verapamil      Junctional rhythm   Influenza Vaccines Rash    tinnitus    Review of Systems: Negative except as noted in the HPI.  Objective: No changes noted in today's physical examination. There were no vitals filed for this visit. Vincent Allen is a pleasant 89 y.o. male in NAD. AAO x 3.  Vascular Examination: Capillary refill time immediate b/l. Vascular status intact b/l with palpable pedal pulses. Pedal hair decreased b/l. No pain with calf compression b/l. Skin temperature gradient WNL b/l. No cyanosis or clubbing b/l. No ischemia or gangrene noted b/l. Trace edema noted BLE.  Neurological Examination: Sensation grossly intact b/l with 10 gram monofilament. Vibratory sensation intact b/l. Pt has subjective symptoms of  neuropathy.  Dermatological Examination: Pedal skin with normal turgor, texture and tone b/l.  No open wounds. No interdigital macerations.   Toenails 1-5 b/l thick, discolored, elongated with subungual debris and pain on dorsal palpation.   No corns, calluses, nor porokeratotic lesions.  Musculoskeletal Examination: Muscle strength 5/5 to all lower extremity muscle groups bilaterally. Pes planus deformity noted bilateral LE. Utilizes cane for ambulation assistance.  Radiographs: None  Last A1c:      Latest Ref Rng & Units 07/23/2023    8:38 AM 02/07/2023   12:00 AM 12/02/2022   10:15 AM  Hemoglobin A1C  Hemoglobin-A1c 4.6 - 6.5 % 6.7  6.7     7.2      This result is from an external source.    Assessment/Plan: 1. Pain due to onychomycosis of toenails of both feet   2. Diabetes mellitus due to underlying condition with stage 3 chronic kidney disease, unspecified whether long term insulin  use, unspecified whether stage 3a or 3b CKD (HCC)    Consent given for treatment. Patient examined. All patient's and/or POA's questions/concerns addressed on today's visit. Toenails 1-5 debrided in length and girth without incident. Continue foot and shoe inspections daily. Monitor blood glucose per PCP/Endocrinologist's recommendations. Continue soft, supportive shoe gear daily. Report any pedal injuries to medical professional. Call office if there are any questions/concerns. -Patient/POA to call should there be question/concern in the interim.   Return in about 3 months (around 01/07/2024).  Vincent Allen, DPM      [facility] LOCATION: [address]  [place], [place] [address]                   Office [phone]   [facility] LOCATION: [address] [place], [place] [address] Office [phone]

## 2023-10-14 ENCOUNTER — Other Ambulatory Visit: Payer: Self-pay

## 2023-10-14 MED ORDER — EZETIMIBE 10 MG PO TABS: 10.0000 mg | ORAL_TABLET | Freq: Every day | ORAL | 3 refills | Status: DC

## 2023-10-16 ENCOUNTER — Encounter: Payer: Self-pay | Admitting: Family Medicine

## 2023-10-20 ENCOUNTER — Other Ambulatory Visit: Payer: Self-pay

## 2023-10-20 DIAGNOSIS — I4819 Other persistent atrial fibrillation: Secondary | ICD-10-CM

## 2023-10-20 MED ORDER — SITAGLIPTIN PHOSPHATE 25 MG PO TABS: 25.0000 mg | ORAL_TABLET | Freq: Every day | ORAL | 3 refills | Status: DC

## 2023-10-20 MED ORDER — APIXABAN 2.5 MG PO TABS: 2.5000 mg | ORAL_TABLET | Freq: Two times a day (BID) | ORAL | 0 refills | Status: DC

## 2023-10-20 MED ORDER — EZETIMIBE 10 MG PO TABS: 10.0000 mg | ORAL_TABLET | Freq: Every day | ORAL | 3 refills | Status: AC

## 2023-10-20 MED ORDER — EMPAGLIFLOZIN 25 MG PO TABS
25.0000 mg | ORAL_TABLET | Freq: Every day | ORAL | 5 refills | Status: AC
Start: 2023-10-20 — End: ?

## 2023-10-30 ENCOUNTER — Other Ambulatory Visit: Payer: Self-pay

## 2023-10-30 MED ORDER — LOSARTAN POTASSIUM 25 MG PO TABS
25.0000 mg | ORAL_TABLET | Freq: Every day | ORAL | 3 refills | Status: AC
Start: 2023-10-30 — End: ?

## 2023-11-03 ENCOUNTER — Encounter: Payer: Self-pay | Admitting: Family Medicine

## 2023-11-04 ENCOUNTER — Other Ambulatory Visit: Payer: Self-pay | Admitting: Family Medicine

## 2023-11-04 MED ORDER — FUROSEMIDE 40 MG PO TABS: 40.0000 mg | ORAL_TABLET | Freq: Two times a day (BID) | ORAL | 1 refills | Status: DC | PRN

## 2023-11-25 DIAGNOSIS — H47323 Drusen of optic disc, bilateral: Secondary | ICD-10-CM | POA: Diagnosis not present

## 2023-11-25 DIAGNOSIS — H353131 Nonexudative age-related macular degeneration, bilateral, early dry stage: Secondary | ICD-10-CM | POA: Diagnosis not present

## 2023-11-25 DIAGNOSIS — H04123 Dry eye syndrome of bilateral lacrimal glands: Secondary | ICD-10-CM | POA: Diagnosis not present

## 2023-11-25 DIAGNOSIS — H02834 Dermatochalasis of left upper eyelid: Secondary | ICD-10-CM | POA: Diagnosis not present

## 2023-11-25 DIAGNOSIS — H35371 Puckering of macula, right eye: Secondary | ICD-10-CM | POA: Diagnosis not present

## 2023-11-25 DIAGNOSIS — Z961 Presence of intraocular lens: Secondary | ICD-10-CM | POA: Diagnosis not present

## 2023-11-25 DIAGNOSIS — H02831 Dermatochalasis of right upper eyelid: Secondary | ICD-10-CM | POA: Diagnosis not present

## 2023-11-25 LAB — HM DIABETES EYE EXAM

## 2023-11-26 ENCOUNTER — Encounter: Payer: Self-pay | Admitting: Family Medicine

## 2023-12-01 ENCOUNTER — Other Ambulatory Visit: Payer: Self-pay | Admitting: Family Medicine

## 2023-12-01 DIAGNOSIS — I4819 Other persistent atrial fibrillation: Secondary | ICD-10-CM

## 2023-12-12 DIAGNOSIS — I509 Heart failure, unspecified: Secondary | ICD-10-CM | POA: Diagnosis not present

## 2023-12-12 DIAGNOSIS — I129 Hypertensive chronic kidney disease with stage 1 through stage 4 chronic kidney disease, or unspecified chronic kidney disease: Secondary | ICD-10-CM | POA: Diagnosis not present

## 2023-12-12 DIAGNOSIS — N2581 Secondary hyperparathyroidism of renal origin: Secondary | ICD-10-CM | POA: Diagnosis not present

## 2023-12-12 DIAGNOSIS — I4891 Unspecified atrial fibrillation: Secondary | ICD-10-CM | POA: Diagnosis not present

## 2023-12-12 DIAGNOSIS — E1122 Type 2 diabetes mellitus with diabetic chronic kidney disease: Secondary | ICD-10-CM | POA: Diagnosis not present

## 2023-12-12 DIAGNOSIS — E1129 Type 2 diabetes mellitus with other diabetic kidney complication: Secondary | ICD-10-CM | POA: Diagnosis not present

## 2023-12-21 ENCOUNTER — Other Ambulatory Visit: Payer: Self-pay | Admitting: Family Medicine

## 2023-12-21 DIAGNOSIS — I4819 Other persistent atrial fibrillation: Secondary | ICD-10-CM

## 2023-12-22 DIAGNOSIS — L570 Actinic keratosis: Secondary | ICD-10-CM | POA: Diagnosis not present

## 2023-12-22 DIAGNOSIS — D044 Carcinoma in situ of skin of scalp and neck: Secondary | ICD-10-CM | POA: Diagnosis not present

## 2023-12-22 DIAGNOSIS — C4442 Squamous cell carcinoma of skin of scalp and neck: Secondary | ICD-10-CM | POA: Diagnosis not present

## 2023-12-26 ENCOUNTER — Ambulatory Visit (INDEPENDENT_AMBULATORY_CARE_PROVIDER_SITE_OTHER): Payer: PPO

## 2023-12-26 DIAGNOSIS — I447 Left bundle-branch block, unspecified: Secondary | ICD-10-CM | POA: Diagnosis not present

## 2023-12-26 LAB — CUP PACEART REMOTE DEVICE CHECK
Battery Remaining Longevity: 90 mo
Battery Voltage: 2.98 V
Brady Statistic RA Percent Paced: 0 %
Brady Statistic RV Percent Paced: 98.68 %
Date Time Interrogation Session: 20251010015605
Implantable Lead Connection Status: 753985
Implantable Lead Connection Status: 753985
Implantable Lead Connection Status: 753985
Implantable Lead Implant Date: 20180706
Implantable Lead Implant Date: 20180706
Implantable Lead Implant Date: 20230407
Implantable Lead Location: 753858
Implantable Lead Location: 753859
Implantable Lead Location: 753860
Implantable Lead Model: 5076
Implantable Lead Model: 5076
Implantable Pulse Generator Implant Date: 20230407
Lead Channel Impedance Value: 1026 Ohm
Lead Channel Impedance Value: 1159 Ohm
Lead Channel Impedance Value: 1159 Ohm
Lead Channel Impedance Value: 1159 Ohm
Lead Channel Impedance Value: 1197 Ohm
Lead Channel Impedance Value: 1292 Ohm
Lead Channel Impedance Value: 304 Ohm
Lead Channel Impedance Value: 361 Ohm
Lead Channel Impedance Value: 380 Ohm
Lead Channel Impedance Value: 437 Ohm
Lead Channel Impedance Value: 551 Ohm
Lead Channel Impedance Value: 570 Ohm
Lead Channel Impedance Value: 722 Ohm
Lead Channel Impedance Value: 741 Ohm
Lead Channel Pacing Threshold Amplitude: 0.75 V
Lead Channel Pacing Threshold Amplitude: 1.375 V
Lead Channel Pacing Threshold Pulse Width: 0.4 ms
Lead Channel Pacing Threshold Pulse Width: 0.8 ms
Lead Channel Sensing Intrinsic Amplitude: 1.125 mV
Lead Channel Sensing Intrinsic Amplitude: 1.125 mV
Lead Channel Sensing Intrinsic Amplitude: 7 mV
Lead Channel Sensing Intrinsic Amplitude: 7 mV
Lead Channel Setting Pacing Amplitude: 2 V
Lead Channel Setting Pacing Amplitude: 2 V
Lead Channel Setting Pacing Pulse Width: 0.4 ms
Lead Channel Setting Pacing Pulse Width: 0.8 ms
Lead Channel Setting Sensing Sensitivity: 1.2 mV
Zone Setting Status: 755011
Zone Setting Status: 755011

## 2023-12-26 NOTE — Progress Notes (Signed)
 Remote PPM Transmission

## 2023-12-28 ENCOUNTER — Ambulatory Visit: Payer: Self-pay | Admitting: Cardiology

## 2023-12-29 NOTE — Progress Notes (Signed)
 Remote PPM Transmission

## 2024-01-01 ENCOUNTER — Encounter (HOSPITAL_BASED_OUTPATIENT_CLINIC_OR_DEPARTMENT_OTHER): Payer: Self-pay | Admitting: Pharmacist

## 2024-01-01 NOTE — Progress Notes (Signed)
 Pharmacy Quality Measure Review  This patient is appearing on a report from [employer] for being at risk of failing the adherence measure for diabetes medications this calendar year.   Medication: Jardiance  Last fill date: 10/13/2023 for 30 day supply  Medication: Glipizide  Last fill date: 09/26/2023 for 25 day supply  Medication: Januvia  Last fill date: 09/22/2023 for 30 day supply  Patient's pharmacy benefits are now showing as [facility] and patient has switched to using [employer]. He actually filled the above medications above in early August for either 90 or 100 day supply.    Insurance report was not up to date - patient no longer is covered by [employer]. No action needed at this time.  Madelin Ray, PharmD Clinical Pharmacist [facility]  Population Health [phone]

## 2024-01-11 NOTE — Assessment & Plan Note (Signed)
 Well controlled, no changes to meds. Encouraged heart healthy diet such as the DASH diet and exercise as tolerated.

## 2024-01-11 NOTE — Assessment & Plan Note (Signed)
 hgba1c acceptable, minimize simple carbs. Increase exercise as tolerated. Continue current meds

## 2024-01-11 NOTE — Assessment & Plan Note (Signed)
Improved with decreased sodium intake and compression stockings used daily. ?

## 2024-01-11 NOTE — Assessment & Plan Note (Signed)
 Encouraged moist heat and gentle stretching as tolerated. May try NSAIDs and prescription meds as directed and report if symptoms worsen or seek immediate care

## 2024-01-11 NOTE — Assessment & Plan Note (Signed)
 Encourage heart healthy diet such as MIND or DASH diet, increase exercise, avoid trans fats, simple carbohydrates and processed foods, consider a krill or fish or flaxseed oil cap daily.

## 2024-01-11 NOTE — Assessment & Plan Note (Signed)
 Supplement and monitor

## 2024-01-11 NOTE — Assessment & Plan Note (Signed)
No recent exacerbation, no changes 

## 2024-01-12 ENCOUNTER — Ambulatory Visit: Attending: Cardiology | Admitting: Cardiology

## 2024-01-12 ENCOUNTER — Encounter: Payer: Self-pay | Admitting: Cardiology

## 2024-01-12 VITALS — BP 104/60 | HR 76 | Ht 72.0 in | Wt 180.0 lb

## 2024-01-12 DIAGNOSIS — I447 Left bundle-branch block, unspecified: Secondary | ICD-10-CM

## 2024-01-12 DIAGNOSIS — Z950 Presence of cardiac pacemaker: Secondary | ICD-10-CM | POA: Diagnosis not present

## 2024-01-12 DIAGNOSIS — I4821 Permanent atrial fibrillation: Secondary | ICD-10-CM

## 2024-01-12 DIAGNOSIS — I5022 Chronic systolic (congestive) heart failure: Secondary | ICD-10-CM | POA: Diagnosis not present

## 2024-01-12 LAB — CUP PACEART INCLINIC DEVICE CHECK
Date Time Interrogation Session: 20251027172842
Implantable Lead Connection Status: 753985
Implantable Lead Connection Status: 753985
Implantable Lead Connection Status: 753985
Implantable Lead Implant Date: 20180706
Implantable Lead Implant Date: 20180706
Implantable Lead Implant Date: 20230407
Implantable Lead Location: 753858
Implantable Lead Location: 753859
Implantable Lead Location: 753860
Implantable Lead Model: 5076
Implantable Lead Model: 5076
Implantable Pulse Generator Implant Date: 20230407
Lead Channel Impedance Value: 399 Ohm
Lead Channel Impedance Value: 475 Ohm
Lead Channel Impedance Value: 760 Ohm
Lead Channel Pacing Threshold Amplitude: 0.75 V
Lead Channel Pacing Threshold Amplitude: 1 V
Lead Channel Pacing Threshold Amplitude: 1.25 V
Lead Channel Pacing Threshold Pulse Width: 0.4 ms
Lead Channel Pacing Threshold Pulse Width: 0.8 ms
Lead Channel Sensing Intrinsic Amplitude: 0.9 mV
Lead Channel Sensing Intrinsic Amplitude: 1 mV

## 2024-01-12 NOTE — Progress Notes (Signed)
  Electrophysiology Office Note:   Date:  01/12/2024  ID:  Vincent Allen, DOB 04-Sep-1933, MRN [id number]  Primary Cardiologist: Vinie JAYSON Maxcy, MD Primary Heart Failure: None Electrophysiologist: Elspeth Sage, MD (Inactive)      History of Present Illness:   Vincent Allen is a 89 y.o. male with h/o atrial fibrillation, mildly reduced heart failure, pacing induced cardiomyopathy seen today for routine electrophysiology followup.   Since last being seen in our clinic the patient reports doing he has no acute complaints.  He is able to do his daily activity without restriction..  he denies chest pain, palpitations, dyspnea, PND, orthopnea, nausea, vomiting, dizziness, syncope, edema, weight gain, or early satiety.   Review of systems complete and found to be negative unless listed in HPI.      EP Information / Studies Reviewed:    EKG is ordered today. Personal review as below.  EKG Interpretation Date/Time:  Monday January 12 2024 16:20:12 EDT Ventricular Rate:  76 PR Interval:    QRS Duration:  224 QT Interval:  508 QTC Calculation: 571 R Axis:   -40  Text Interpretation: Ventricular-paced rhythm When compared with ECG of 15-Jan-2023 12:04, No significant change was found Confirmed by Cherrie Franca ([id number]) on 01/12/2024 4:31:03 PM   PPM Interrogation-  reviewed in detail today,  See PACEART report.  Device History: Medtronic BiV PPM implanted April 2023 for atrial fibrillation with slow ventricular response  Risk Assessment/Calculations:    CHA2DS2-VASc Score = 4   This indicates a 4.8% annual risk of stroke. The patient's score is based upon: CHF History: 1 HTN History: 0 Diabetes History: 1 Stroke History: 0 Vascular Disease History: 0 Age Score: 2 Gender Score: 0            Physical Exam:   VS:  BP 104/60 (BP Location: Left Arm, Patient Position: Sitting, Cuff Size: Normal)   Pulse 76   Ht 6' (1.829 m)   Wt 180 lb (81.6 kg)   SpO2 97%   BMI 24.41  kg/m    Wt Readings from Last 3 Encounters:  01/12/24 180 lb (81.6 kg)  09/10/23 184 lb 12.8 oz (83.8 kg)  07/14/23 188 lb 3.2 oz (85.4 kg)     GEN: Well nourished, well developed in no acute distress NECK: No JVD; No carotid bruits CARDIAC: Regular rate and rhythm, no murmurs, rubs, gallops RESPIRATORY:  Clear to auscultation without rales, wheezing or rhonchi  ABDOMEN: Soft, non-tender, non-distended EXTREMITIES:  No edema; No deformity   ASSESSMENT AND PLAN:    Atrial fibrillation with slow ventricular response s/p Medtronic PPM  Normal PPM function See Pace Art report Has Abbott CS lead and not MRI compatible No changes today  2.  Secondary hypercoagulable state: On Eliquis   3.  Chronic systolic heart failure: Mildly reduced ejection fraction.  Post CRT-P.  Further plan per primary cardiology.  Disposition:   Follow up with EP Team in 12 months  Signed, Paulanthony Gleaves Gladis Norton, MD

## 2024-01-13 ENCOUNTER — Ambulatory Visit: Payer: Self-pay | Admitting: Cardiology

## 2024-01-13 ENCOUNTER — Other Ambulatory Visit: Payer: Self-pay | Admitting: Family Medicine

## 2024-01-15 ENCOUNTER — Encounter: Payer: Self-pay | Admitting: Family Medicine

## 2024-01-15 ENCOUNTER — Ambulatory Visit: Admitting: Family Medicine

## 2024-01-15 VITALS — BP 124/72 | HR 86 | Ht 72.0 in | Wt 182.6 lb

## 2024-01-15 DIAGNOSIS — E785 Hyperlipidemia, unspecified: Secondary | ICD-10-CM | POA: Diagnosis not present

## 2024-01-15 DIAGNOSIS — I10 Essential (primary) hypertension: Secondary | ICD-10-CM

## 2024-01-15 DIAGNOSIS — E0822 Diabetes mellitus due to underlying condition with diabetic chronic kidney disease: Secondary | ICD-10-CM | POA: Diagnosis not present

## 2024-01-15 DIAGNOSIS — I5032 Chronic diastolic (congestive) heart failure: Secondary | ICD-10-CM | POA: Diagnosis not present

## 2024-01-15 DIAGNOSIS — E559 Vitamin D deficiency, unspecified: Secondary | ICD-10-CM

## 2024-01-15 DIAGNOSIS — N183 Chronic kidney disease, stage 3 unspecified: Secondary | ICD-10-CM

## 2024-01-15 DIAGNOSIS — M5451 Vertebrogenic low back pain: Secondary | ICD-10-CM

## 2024-01-15 DIAGNOSIS — R600 Localized edema: Secondary | ICD-10-CM

## 2024-01-15 LAB — TSH: TSH: 2.97 u[IU]/mL (ref 0.35–5.50)

## 2024-01-15 LAB — COMPREHENSIVE METABOLIC PANEL WITH GFR
ALT: 13 U/L (ref 0–53)
AST: 13 U/L (ref 0–37)
Albumin: 3.5 g/dL (ref 3.5–5.2)
Alkaline Phosphatase: 60 U/L (ref 39–117)
BUN: 34 mg/dL — ABNORMAL HIGH (ref 6–23)
CO2: 23 meq/L (ref 19–32)
Calcium: 8.8 mg/dL (ref 8.4–10.5)
Chloride: 104 meq/L (ref 96–112)
Creatinine, Ser: 1.86 mg/dL — ABNORMAL HIGH (ref 0.40–1.50)
GFR: 31.92 mL/min — ABNORMAL LOW (ref >60.00)
Glucose, Bld: 118 mg/dL — ABNORMAL HIGH (ref 70–99)
Potassium: 4 meq/L (ref 3.5–5.1)
Sodium: 138 meq/L (ref 135–145)
Total Bilirubin: 0.6 mg/dL (ref 0.2–1.2)
Total Protein: 6.1 g/dL (ref 6.0–8.3)

## 2024-01-15 LAB — CBC WITH DIFFERENTIAL/PLATELET
Basophils Absolute: 0 K/uL (ref 0.0–0.1)
Basophils Relative: 0.5 % (ref 0.0–3.0)
Eosinophils Absolute: 0.4 K/uL (ref 0.0–0.7)
Eosinophils Relative: 5.5 % — ABNORMAL HIGH (ref 0.0–5.0)
HCT: 47.1 % (ref 39.0–52.0)
Hemoglobin: 15.1 g/dL (ref 13.0–17.0)
Lymphocytes Relative: 14.8 % (ref 12.0–46.0)
Lymphs Abs: 1.1 K/uL (ref 0.7–4.0)
MCHC: 32 g/dL (ref 30.0–36.0)
MCV: 88.1 fl (ref 78.0–100.0)
Monocytes Absolute: 0.9 K/uL (ref 0.1–1.0)
Monocytes Relative: 11.8 % (ref 3.0–12.0)
Neutro Abs: 5.2 K/uL (ref 1.4–7.7)
Neutrophils Relative %: 67.4 % (ref 43.0–77.0)
Platelets: 182 K/uL (ref 150.0–400.0)
RBC: 5.34 Mil/uL (ref 4.22–5.81)
RDW: 13.4 % (ref 11.5–15.5)
WBC: 7.7 K/uL (ref 4.0–10.5)

## 2024-01-15 LAB — LIPID PANEL
Cholesterol: 130 mg/dL (ref 0–200)
HDL: 36.2 mg/dL — ABNORMAL LOW (ref >39.00)
LDL Cholesterol: 68 mg/dL (ref 0–99)
NonHDL: 93.6
Total CHOL/HDL Ratio: 4
Triglycerides: 127 mg/dL (ref 0.0–149.0)
VLDL: 25.4 mg/dL (ref 0.0–40.0)

## 2024-01-15 NOTE — Patient Instructions (Signed)
 Shingrix is the new shingles shot, 2 shots over 2-6 months, confirm coverage with insurance and document, then can return here for shots with nurse appt or at pharmacy   [facility] are [facility] walk in clinics M-F 9-4

## 2024-01-16 ENCOUNTER — Ambulatory Visit: Payer: Self-pay | Admitting: Family Medicine

## 2024-01-16 LAB — HEMOGLOBIN A1C: Hgb A1c MFr Bld: 7.4 % — ABNORMAL HIGH (ref 4.6–6.5)

## 2024-01-16 LAB — VITAMIN D 25 HYDROXY (VIT D DEFICIENCY, FRACTURES): VITD: 96.84 ng/mL (ref 30.00–100.00)

## 2024-01-18 ENCOUNTER — Encounter: Payer: Self-pay | Admitting: Family Medicine

## 2024-01-18 NOTE — Progress Notes (Signed)
 Subjective:    Patient ID: Vincent Allen, male    DOB: June 24, 1933, 89 y.o.   MRN: [id number]  Chief Complaint  Patient presents with   Follow-up    No concerns     HPI Discussed the use of AI scribe software for clinical note transcription with the patient, who gave verbal consent to proceed.  History of Present Illness [name] is an 89 year old male with hypertension and diabetes who presents for a routine follow-up visit.  He maintains a balanced diet with adequate protein intake from nuts, meats, beans, eggs, and cheese. He consumes an egg every morning and stays active by walking regularly. Hydration is maintained with regular water intake, and bowel movements are regular without issues.  He recently visited a dermatologist two weeks ago and was advised to return in six months. A month ago, he consulted a kidney specialist, who reported normal kidney function. His pacemaker is checked annually, with the last check being satisfactory.  He is currently on losartan  and Januvia . A urine test five months ago showed increased protein, but recent results from the kidney specialist were normal.  He stopped taking vitamin D  after levels became too high, previously taking 2000 mcg daily. He is not currently taking multivitamins or zinc but is open to restarting a multivitamin if needed.  He notes a change in the color of his fingernails. No sores on his feet and no blood in stools. Denies CP/palp/SOB/HA/congestion/fevers/GI or GU c/o. Taking meds as prescribed     Past Medical History:  Diagnosis Date   Arthritis    Bradycardia    Cataract    Chronic kidney disease stage III (GFR 30-59 ml/min) 05/03/2012   Colon polyps    Complication of anesthesia    emesis   Diabetes mellitus    GERD (gastroesophageal reflux disease)    HOH (hard of hearing)    HTN (hypertension) 01/13/2013   Hyperlipidemia    Hypertension    Pacemaker 12/2016   Persistent atrial  fibrillation (HCC)    Presence of permanent cardiac pacemaker 09/20/2016   Symptomatic bradycardia    Vitamin D  deficiency 07/17/2015    Past Surgical History:  Procedure Laterality Date   ANKLE ARTHROPLASTY     BIV PACEMAKER INSERTION CRT-P N/A 06/22/2021   Procedure: UPGRADE TO BIV PACEMAKER INSERTION CRT-P;  Surgeon: Fernande Elspeth JAYSON, MD;  Location[facility] INVASIVE CV LAB;  Service: Cardiovascular;  Laterality: N/A;   CARDIOVERSION N/A 08/20/2016   Procedure: CARDIOVERSION;  Surgeon: Francyne Headland, MD;  Location: [facility] ENDOSCOPY;  Service: Cardiovascular;  Laterality: N/A;   CATARACT EXTRACTION  2010, 2014   CATARACT EXTRACTION  02/28/14   CHOLECYSTECTOMY     CHOLECYSTECTOMY N/A 05/03/2012   Procedure: LAPAROSCOPIC CHOLECYSTECTOMY WITH INTRAOPERATIVE CHOLANGIOGRAM;  Surgeon: Lynwood MALVA Pina, MD;  Location: [facility] OR;  Service: General;  Laterality: N/A;   INSERT / REPLACE / REMOVE PACEMAKER  09/20/2016   PACEMAKER IMPLANT N/A 09/20/2016   Procedure: Pacemaker Implant;  Surgeon: Fernande Elspeth JAYSON, MD;  Location: [facility] INVASIVE CV LAB;  Service: Cardiovascular;  Laterality: N/A;   ROTATOR CUFF REPAIR     SHOULDER SURGERY     TONSILLECTOMY      Family History  Problem Relation Age of Onset   Cancer Mother        colon, breast, pancreas, skin cancer   Heart disease Father        heart valve replaced   Stroke Father    Diabetes  Father    Cancer Paternal Grandmother        colon   Obesity Son    Heart disease Son        bradycardia    Social History   Socioeconomic History   Marital status: Widowed    Spouse name: Not on file   Number of children: Not on file   Years of education: Not on file   Highest education level: Not on file  Occupational History   Not on file  Tobacco Use   Smoking status: Never   Smokeless tobacco: Never  Vaping Use   Vaping status: Never Used  Substance and Sexual Activity   Alcohol  use: Yes    Alcohol /week: 1.0 standard drink of alcohol     Types: 1 Glasses of  wine per week   Drug use: No   Sexual activity: Not on file    Comment: lives alone , no major dietary restrictions, retired as production designer, 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  Other Topics Concern   Not on file  Social History Narrative   Not on file   Social Drivers of Health   Financial Resource Strain: Low Risk  (04/24/2023)   Overall Financial Resource Strain (CARDIA)    Difficulty of Paying Living Expenses: Not hard at all  Food Insecurity: No Food Insecurity (04/24/2023)   Hunger Vital Sign    Worried About Running Out of Food in the Last Year: Never true    Ran Out of Food in the Last Year: Never true  Transportation Needs: No Transportation Needs (04/24/2023)   PRAPARE - Administrator, Civil Service (Medical): No    Lack of Transportation (Non-Medical): No  Physical Activity: Insufficiently Active (04/24/2023)   Exercise Vital Sign    Days of Exercise per Week: 5 days    Minutes of Exercise per Session: 20 min  Stress: No Stress Concern Present (04/24/2023)   [employer] of Occupational Health - Occupational Stress Questionnaire    Feeling of Stress : Not at all  Social Connections: Moderately Integrated (04/24/2023)   Social Connection and Isolation Panel    Frequency of Communication with Friends and Family: More than three times a week    Frequency of Social Gatherings with Friends and Family: More than three times a week    Attends Religious Services: More than 4 times per year    Active Member of [employer] or Organizations: Yes    Attends Banker Meetings: More than 4 times per year    Marital Status: Widowed  Intimate Partner Violence: Not At Risk (04/24/2023)   Humiliation, Afraid, Rape, and Kick questionnaire    Fear of Current or Ex-Partner: No    Emotionally Abused: No    Physically Abused: No    Sexually Abused: No    Outpatient Medications Prior to Visit  Medication Sig Dispense Refill   acetaminophen  (TYLENOL ) 500 MG tablet Take 500  mg by mouth every 6 (six) hours as needed for moderate pain.     ELIQUIS  2.5 MG TABS tablet TAKE 1 TABLET BY MOUTH TWICE  DAILY 180 tablet 3   empagliflozin  (JARDIANCE ) 25 MG TABS tablet Take 1 tablet (25 mg total) by mouth daily. 30 tablet 5   ezetimibe  (ZETIA ) 10 MG tablet Take 1 tablet (10 mg total) by mouth daily. 90 tablet 3   furosemide  (LASIX ) 40 MG tablet TAKE 1 TABLET BY MOUTH TWICE  DAILY AS NEEDED 200 tablet 2   glipiZIDE  (GLUCOTROL )  5 MG tablet 1 tab po BID 180 tablet 1   glucose blood (ONE TOUCH ULTRA TEST) test strip USE 1 STRIP 2 TIMES DAILY TO CHECK BLOOD SUGAR DX E08.22, N18.3 300 each 1   JANUVIA  25 MG tablet TAKE 1 TABLET BY MOUTH DAILY 90 tablet 3   Krill Oil 1000 MG CAPS Take 1,000 mg by mouth daily.     losartan  (COZAAR ) 25 MG tablet Take 1 tablet (25 mg total) by mouth daily. 90 tablet 3   potassium chloride  SA (KLOR-CON  M) 20 MEQ tablet Take 10 mEq by mouth daily.     b complex vitamins capsule Take 1 capsule by mouth daily. (Patient not taking: Reported on 01/15/2024)     Multiple Vitamin (MULTIVITAMIN WITH MINERALS) TABS tablet Take 1 tablet by mouth daily. (Patient not taking: Reported on 01/15/2024)     Zinc 50 MG TABS Take 50 mg by mouth daily. (Patient not taking: Reported on 01/15/2024)     No facility-administered medications prior to visit.    Allergies  Allergen Reactions   Atorvastatin      Myalgias / leg pain and weakness   Penicillins Hives and Itching    Has patient had a PCN reaction causing immediate rash, facial/tongue/throat swelling, SOB or lightheadedness with hypotension:Yes Has patient had a PCN reaction causing severe rash involving mucus membranes or skin necrosis:No Has patient had a PCN reaction that required hospitalization:No Has patient had a PCN reaction occurring within the last 10 years:No If all of the above answers are NO, then may proceed with Cephalosporin use.    Verapamil      Junctional rhythm   Influenza Vaccines Rash     tinnitus    Review of Systems  Constitutional:  Negative for fever and malaise/fatigue.  HENT:  Negative for congestion.   Eyes:  Negative for blurred vision.  Respiratory:  Negative for shortness of breath.   Cardiovascular:  Negative for chest pain, palpitations and leg swelling.  Gastrointestinal:  Negative for abdominal pain, blood in stool and nausea.  Genitourinary:  Negative for dysuria and frequency.  Musculoskeletal:  Negative for falls.  Skin:  Negative for rash.  Neurological:  Negative for dizziness, loss of consciousness and headaches.  Endo/Heme/Allergies:  Negative for environmental allergies.  Psychiatric/Behavioral:  Negative for depression. The patient is not nervous/anxious.        Objective:    Physical Exam Vitals reviewed.  Constitutional:      Appearance: Normal appearance. He is not ill-appearing.  HENT:     Head: Normocephalic and atraumatic.     Nose: Nose normal.  Eyes:     Conjunctiva/sclera: Conjunctivae normal.  Cardiovascular:     Rate and Rhythm: Normal rate.     Pulses: Normal pulses.     Heart sounds: Normal heart sounds. No murmur heard. Pulmonary:     Effort: Pulmonary effort is normal.     Breath sounds: Normal breath sounds. No wheezing.  Abdominal:     Palpations: Abdomen is soft. There is no mass.     Tenderness: There is no abdominal tenderness.  Musculoskeletal:     Cervical back: Normal range of motion.     Right lower leg: No edema.     Left lower leg: No edema.  Skin:    General: Skin is warm and dry.  Neurological:     General: No focal deficit present.     Mental Status: He is alert and oriented to person, place, and time.  Psychiatric:  Mood and Affect: Mood normal.     BP 124/72 (BP Location: Left Arm, Patient Position: Sitting, Cuff Size: Normal)   Pulse 86   Ht 6' (1.829 m)   Wt 182 lb 9.6 oz (82.8 kg)   SpO2 97%   BMI 24.77 kg/m  Wt Readings from Last 3 Encounters:  01/15/24 182 lb 9.6 oz (82.8 kg)   01/12/24 180 lb (81.6 kg)  09/10/23 184 lb 12.8 oz (83.8 kg)    Diabetic Foot Exam - Simple   No data filed    Lab Results  Component Value Date   WBC 7.7 01/15/2024   HGB 15.1 01/15/2024   HCT 47.1 01/15/2024   PLT 182.0 01/15/2024   GLUCOSE 118 (H) 01/15/2024   CHOL 130 01/15/2024   TRIG 127.0 01/15/2024   HDL 36.20 (L) 01/15/2024   LDLDIRECT 65.0 11/03/2018   LDLCALC 68 01/15/2024   ALT 13 01/15/2024   AST 13 01/15/2024   NA 138 01/15/2024   K 4.0 01/15/2024   CL 104 01/15/2024   CREATININE 1.86 (H) 01/15/2024   BUN 34 (H) 01/15/2024   CO2 23 01/15/2024   TSH 2.97 01/15/2024   PSA 1.18 10/07/2012   INR 1.0 08/15/2016   HGBA1C 7.4 (H) 01/15/2024   MICROALBUR 20.7 (H) 07/23/2023    Lab Results  Component Value Date   TSH 2.97 01/15/2024   Lab Results  Component Value Date   WBC 7.7 01/15/2024   HGB 15.1 01/15/2024   HCT 47.1 01/15/2024   MCV 88.1 01/15/2024   PLT 182.0 01/15/2024   Lab Results  Component Value Date   NA 138 01/15/2024   K 4.0 01/15/2024   CO2 23 01/15/2024   GLUCOSE 118 (H) 01/15/2024   BUN 34 (H) 01/15/2024   CREATININE 1.86 (H) 01/15/2024   BILITOT 0.6 01/15/2024   ALKPHOS 60 01/15/2024   AST 13 01/15/2024   ALT 13 01/15/2024   PROT 6.1 01/15/2024   ALBUMIN  3.5 01/15/2024   CALCIUM  8.8 01/15/2024   ANIONGAP 10 05/03/2021   EGFR 35.0 06/07/2023   GFR 31.92 (L) 01/15/2024   Lab Results  Component Value Date   CHOL 130 01/15/2024   Lab Results  Component Value Date   HDL 36.20 (L) 01/15/2024   Lab Results  Component Value Date   LDLCALC 68 01/15/2024   Lab Results  Component Value Date   TRIG 127.0 01/15/2024   Lab Results  Component Value Date   CHOLHDL 4 01/15/2024   Lab Results  Component Value Date   HGBA1C 7.4 (H) 01/15/2024       Assessment & Plan:  Chronic diastolic CHF (congestive heart failure) (HCC) Assessment & Plan: No recent exacerbation, no changes   Diabetes mellitus due to underlying  condition with stage 3 chronic kidney disease, unspecified whether long term insulin  use, unspecified whether stage 3a or 3b CKD (HCC) Assessment & Plan: hgba1c acceptable, minimize simple carbs. Increase exercise as tolerated. Continue current meds   Orders: -     Hemoglobin A1c  Essential hypertension Assessment & Plan: Well controlled, no changes to meds. Encouraged heart healthy diet such as the DASH diet and exercise as tolerated.    Orders: -     Comprehensive metabolic panel with GFR -     CBC with Differential/Platelet -     TSH  Hyperlipidemia LDL goal <70 Assessment & Plan: Encourage heart healthy diet such as MIND or DASH diet, increase exercise, avoid trans fats, simple carbohydrates and processed  foods, consider a krill or fish or flaxseed oil cap daily.   Orders: -     Lipid panel  Pedal edema Assessment & Plan: Improved with decreased sodium intake and compression stockings used daily.   Vitamin D  deficiency Assessment & Plan: Supplement and monitor   Orders: -     VITAMIN D  25 Hydroxy (Vit-D Deficiency, Fractures)  Vertebrogenic low back pain Assessment & Plan: Encouraged moist heat and gentle stretching as tolerated. May try NSAIDs and prescription meds as directed and report if symptoms worsen or seek immediate care      Assessment and Plan Assessment & Plan Diabetes mellitus due to underlying condition with stage 3 chronic kidney disease and essential hypertension Diabetes and hypertension are well-managed with current medications. Recent kidney function tests from nephrologist were within normal range, despite previous increase in proteinuria noted five months ago. Current medications, including losartan  and sitagliptin , are appropriate for managing these conditions and preventing progression of kidney disease. - Request recent lab results and notes from nephrologist. - Continue current medications: losartan  and sitagliptin .  Chronic diastolic  congestive heart failure Specific discussion and changes in management were made during this visit.  Permanent atrial fibrillation Specific discussion and changes in management were made during this visit.  Vitamin D  deficiency Previously elevated vitamin D  levels led to discontinuation of supplementation. Plan to recheck vitamin D  levels to determine current status and adjust supplementation if necessary. If vitamin D  is low normal, consider restarting a multivitamin to prevent deficiencies and support immune function. - Order vitamin D  level. - Consider restarting multivitamin if vitamin D  is low normal.  Fingernail discoloration, possible onychomycosis Discoloration of fingernail may be due to fungal infection. Oral antifungal medications are not recommended due to potential liver toxicity. Advised non-pharmacological treatment options. - Advise soaking fingers in half distilled vinegar and hot water for 10-15 minutes nightly. - Apply Vicks VapoRub to fingernails after soaking. - Monitor for improvement over several months.  Recording duration: 16 minutes     Harlene Horton, MD

## 2024-01-21 ENCOUNTER — Ambulatory Visit: Admitting: Podiatry

## 2024-01-21 DIAGNOSIS — M2141 Flat foot [pes planus] (acquired), right foot: Secondary | ICD-10-CM

## 2024-01-21 DIAGNOSIS — B351 Tinea unguium: Secondary | ICD-10-CM

## 2024-01-21 DIAGNOSIS — M79675 Pain in left toe(s): Secondary | ICD-10-CM

## 2024-01-21 DIAGNOSIS — M2142 Flat foot [pes planus] (acquired), left foot: Secondary | ICD-10-CM

## 2024-01-21 DIAGNOSIS — M79674 Pain in right toe(s): Secondary | ICD-10-CM

## 2024-01-21 DIAGNOSIS — E1142 Type 2 diabetes mellitus with diabetic polyneuropathy: Secondary | ICD-10-CM

## 2024-01-26 ENCOUNTER — Encounter: Payer: Self-pay | Admitting: Podiatry

## 2024-01-26 NOTE — Progress Notes (Signed)
 Subjective:  Patient ID: Vincent Allen, male    DOB: July 09, 1933,  MRN: [id number]  Vincent Allen presents to clinic today for at risk foot care with history of diabetic neuropathy and painful mycotic toenails of both feet that are difficult to trim. Pain interferes with daily activities and wearing enclosed shoe gear comfortably. Patient is accompanied by his son on today's visit. Patient is requesting diabetic shoes on today's visit. Chief Complaint  Patient presents with   Diabetes    DFC NIDDM A1C 7.4. Toenail trim. LOV with PCP 01/18/24.   New problem(s): None.   PCP is Domenica Harlene LABOR, MD.  Allergies  Allergen Reactions   Atorvastatin      Myalgias / leg pain and weakness   Penicillins Hives and Itching    Has patient had a PCN reaction causing immediate rash, facial/tongue/throat swelling, SOB or lightheadedness with hypotension:Yes Has patient had a PCN reaction causing severe rash involving mucus membranes or skin necrosis:No Has patient had a PCN reaction that required hospitalization:No Has patient had a PCN reaction occurring within the last 10 years:No If all of the above answers are NO, then may proceed with Cephalosporin use.    Verapamil      Junctional rhythm   Influenza Vaccines Rash    tinnitus    Review of Systems: Negative except as noted in the HPI.  Objective: No changes noted in today's physical examination. There were no vitals filed for this visit. Vincent Allen is a pleasant 89 y.o. male in NAD. AAO x 3.  Vascular Examination: Capillary refill time immediate b/l. Vascular status intact b/l with palpable pedal pulses. Pedal hair decreased b/l. No pain with calf compression b/l. Skin temperature gradient WNL b/l. No cyanosis or clubbing b/l. No ischemia or gangrene noted b/l. Trace edema noted BLE.  Neurological Examination: Sensation grossly intact b/l with 10 gram monofilament. Vibratory sensation intact b/l. Pt has subjective symptoms of  neuropathy.  Dermatological Examination: Pedal skin with normal turgor, texture and tone b/l.  No open wounds. No interdigital macerations.   Toenails 1-5 b/l thick, discolored, elongated with subungual debris and pain on dorsal palpation.   No corns, calluses, nor porokeratotic lesions.  Musculoskeletal Examination: Muscle strength 5/5 to all lower extremity muscle groups bilaterally. Pes planus deformity noted bilateral LE. Utilizes cane for ambulation assistance.  Radiographs: None  Assessment/Plan: Orders Placed This Encounter  Procedures   For home use only DME Other see comment    To Medical Illustrator [facility] Prosthetics: Dispense one pair extra depth shoes and 3 pair heat moldable insoles.    Length of Need:   12 Months    1. Pain due to onychomycosis of toenails of both feet   2. Pes planus of both feet   3. Diabetic peripheral neuropathy associated with type 2 diabetes mellitus (HCC)   Patient was evaluated and treated. All patient's and/or POA's questions/concerns addressed on today's visit. Mycotic toenails 1-5 b/l debrided in length and girth without incident.  Continue daily foot inspections and monitor blood glucose per PCP/Endocrinologist's recommendations.Continue soft, supportive shoe gear daily. Report any pedal injuries to medical professional. Call office if there are any quesitons/concerns. -DME Rx written for Research Scientist (physical Sciences) [facility] Orth[facility] for one pair extra depth shoes and 3 pair custom molded insoles. To [facility], [address], [place], [place]. Phone: [phone]. Fax: [phone]. -Patient/POA to call should there be question/concern in the interim.   Return in about 3 months (around 04/22/2024).  Vincent Allen, DPM      [facility] LOCATION: [address] [place] [address]                   Office [phone]   [facility] LOCATION: [address] [place], [place] [address] Office [phone]

## 2024-03-03 ENCOUNTER — Encounter: Payer: Self-pay | Admitting: Family Medicine

## 2024-03-03 MED ORDER — EMPAGLIFLOZIN 25 MG PO TABS: 25.0000 mg | ORAL_TABLET | Freq: Every day | ORAL | 1 refills | Status: AC

## 2024-03-05 ENCOUNTER — Encounter: Payer: Self-pay | Admitting: Pharmacist

## 2024-03-05 NOTE — Progress Notes (Signed)
 Pharmacy Quality Measure Review  This patient is appearing on a report from [employer] for being at risk of failing the adherence measure for hypertension (ACEi/ARB) medications this calendar year.   Medication: Losartan  25mg  Last fill date: 09/23/2023 for 90 day supply on [facility] but he switched to [facility] in August.  Filled for 100 day supply on 10/30/2023. He should not need refill yet.   Insurance report was not up to date - patient no longer is covered by [employer]. No action needed at this time.  Madelin Ray, PharmD Clinical Pharmacist [facility]  Population Health [phone]

## 2024-03-26 ENCOUNTER — Ambulatory Visit: Payer: PPO

## 2024-03-26 DIAGNOSIS — I447 Left bundle-branch block, unspecified: Secondary | ICD-10-CM | POA: Diagnosis not present

## 2024-03-28 LAB — CUP PACEART REMOTE DEVICE CHECK
Battery Remaining Longevity: 86 mo
Battery Voltage: 2.98 V
Brady Statistic AP VP Percent: 0 %
Brady Statistic AP VS Percent: 0 %
Brady Statistic AS VP Percent: 90.52 %
Brady Statistic AS VS Percent: 9.48 %
Brady Statistic RA Percent Paced: 0 %
Brady Statistic RV Percent Paced: 90.52 %
Date Time Interrogation Session: 20260109093441
Implantable Lead Connection Status: 753985
Implantable Lead Connection Status: 753985
Implantable Lead Connection Status: 753985
Implantable Lead Implant Date: 20180706
Implantable Lead Implant Date: 20180706
Implantable Lead Implant Date: 20230407
Implantable Lead Location: 753858
Implantable Lead Location: 753859
Implantable Lead Location: 753860
Implantable Lead Model: 5076
Implantable Lead Model: 5076
Implantable Pulse Generator Implant Date: 20230407
Lead Channel Impedance Value: 1026 Ohm
Lead Channel Impedance Value: 1102 Ohm
Lead Channel Impedance Value: 1140 Ohm
Lead Channel Impedance Value: 342 Ohm
Lead Channel Impedance Value: 342 Ohm
Lead Channel Impedance Value: 399 Ohm
Lead Channel Impedance Value: 399 Ohm
Lead Channel Impedance Value: 418 Ohm
Lead Channel Impedance Value: 551 Ohm
Lead Channel Impedance Value: 608 Ohm
Lead Channel Impedance Value: 665 Ohm
Lead Channel Impedance Value: 855 Ohm
Lead Channel Impedance Value: 912 Ohm
Lead Channel Impedance Value: 969 Ohm
Lead Channel Pacing Threshold Amplitude: 0.75 V
Lead Channel Pacing Threshold Amplitude: 1 V
Lead Channel Pacing Threshold Pulse Width: 0.4 ms
Lead Channel Pacing Threshold Pulse Width: 0.8 ms
Lead Channel Sensing Intrinsic Amplitude: 0.875 mV
Lead Channel Sensing Intrinsic Amplitude: 0.875 mV
Lead Channel Sensing Intrinsic Amplitude: 11 mV
Lead Channel Sensing Intrinsic Amplitude: 7 mV
Lead Channel Setting Pacing Amplitude: 2 V
Lead Channel Setting Pacing Amplitude: 2 V
Lead Channel Setting Pacing Pulse Width: 0.4 ms
Lead Channel Setting Pacing Pulse Width: 0.8 ms
Lead Channel Setting Sensing Sensitivity: 1.2 mV
Zone Setting Status: 755011

## 2024-03-29 ENCOUNTER — Ambulatory Visit: Payer: Self-pay | Admitting: Cardiology

## 2024-03-29 ENCOUNTER — Telehealth: Payer: Self-pay

## 2024-03-29 NOTE — Telephone Encounter (Signed)
 Routine Pacemaker transmission:   Normal device function; however, noted elevated optivol over past month indicating likely fluid retention. Known HF, is not being followed by ICM or HF clinic.  He is on Furosemide  40mg  bid prn.   Forwarding to EP triage to reach out and assess symptoms for fluid retention and question if taking Furosemide .  Patient follows Dr. Inocencio.

## 2024-03-29 NOTE — Progress Notes (Signed)
 Remote PPM Transmission

## 2024-03-29 NOTE — Telephone Encounter (Signed)
 Left message for patient to call back

## 2024-03-30 NOTE — Telephone Encounter (Signed)
 Spoke w/ patient son, Vincent Allen, regarding recent device alert for elevated Optivol reading. Remote transmission received on 03/26/2024 which shows elevated Optivol. After inquiring about fluid retention, patient son states they have already addressed issue. Patient son states he noted swelling in patient lower extremities on Friday afternoon and patient had not been urinating as much. States they increased his Lasix  starting Friday, and over the weekend. Swelling has since gone down and patient is back to normal urination.   Advised patient son to send a manual transmission to reassess Optivol reading. States he will send a manual transmission this afternoon around 3-4pm. Informed patient if any swelling reoccurs or patient becomes symptomatic to contact the device clinic. Verbalized understanding.   Will assess manual transmission once received and continue to monitor and update accordingly.

## 2024-03-30 NOTE — Telephone Encounter (Signed)
 Patient son Vincent Allen is returning call to nurse; is requesting call back at [phone]

## 2024-03-31 NOTE — Telephone Encounter (Signed)
 No action needed

## 2024-05-05 ENCOUNTER — Ambulatory Visit: Admitting: Podiatry

## 2024-08-04 ENCOUNTER — Ambulatory Visit: Admitting: Podiatry

## 2024-08-26 ENCOUNTER — Encounter: Admitting: Family Medicine

## 2024-10-07 ENCOUNTER — Encounter: Admitting: Family Medicine
